# Patient Record
Sex: Male | Born: 1960 | Race: Black or African American | Hispanic: No | Marital: Married | Smoking: Former smoker
Health system: Southern US, Community
[De-identification: ages and names within clinical notes are randomized; demographics above are authoritative.]

## PROBLEM LIST (undated history)

## (undated) DIAGNOSIS — I214 Non-ST elevation (NSTEMI) myocardial infarction: Secondary | ICD-10-CM

## (undated) DIAGNOSIS — D696 Thrombocytopenia, unspecified: Secondary | ICD-10-CM

## (undated) DIAGNOSIS — J3489 Other specified disorders of nose and nasal sinuses: Secondary | ICD-10-CM

## (undated) DIAGNOSIS — I10 Essential (primary) hypertension: Secondary | ICD-10-CM

## (undated) DIAGNOSIS — G500 Trigeminal neuralgia: Secondary | ICD-10-CM

## (undated) DIAGNOSIS — E785 Hyperlipidemia, unspecified: Secondary | ICD-10-CM

## (undated) DIAGNOSIS — J45909 Unspecified asthma, uncomplicated: Secondary | ICD-10-CM

## (undated) DIAGNOSIS — G459 Transient cerebral ischemic attack, unspecified: Secondary | ICD-10-CM

## (undated) DIAGNOSIS — G8929 Other chronic pain: Secondary | ICD-10-CM

## (undated) DIAGNOSIS — N400 Enlarged prostate without lower urinary tract symptoms: Secondary | ICD-10-CM

## (undated) DIAGNOSIS — N182 Chronic kidney disease, stage 2 (mild): Secondary | ICD-10-CM

## (undated) DIAGNOSIS — T4145XA Adverse effect of unspecified anesthetic, initial encounter: Secondary | ICD-10-CM

## (undated) DIAGNOSIS — R079 Chest pain, unspecified: Secondary | ICD-10-CM

## (undated) DIAGNOSIS — G44009 Cluster headache syndrome, unspecified, not intractable: Secondary | ICD-10-CM

## (undated) DIAGNOSIS — I5042 Chronic combined systolic (congestive) and diastolic (congestive) heart failure: Secondary | ICD-10-CM

## (undated) DIAGNOSIS — D126 Benign neoplasm of colon, unspecified: Secondary | ICD-10-CM

## (undated) DIAGNOSIS — F1910 Other psychoactive substance abuse, uncomplicated: Secondary | ICD-10-CM

## (undated) DIAGNOSIS — K449 Diaphragmatic hernia without obstruction or gangrene: Secondary | ICD-10-CM

## (undated) DIAGNOSIS — K5790 Diverticulosis of intestine, part unspecified, without perforation or abscess without bleeding: Secondary | ICD-10-CM

## (undated) DIAGNOSIS — I2510 Atherosclerotic heart disease of native coronary artery without angina pectoris: Secondary | ICD-10-CM

## (undated) DIAGNOSIS — E039 Hypothyroidism, unspecified: Secondary | ICD-10-CM

## (undated) DIAGNOSIS — E042 Nontoxic multinodular goiter: Secondary | ICD-10-CM

## (undated) DIAGNOSIS — Z87442 Personal history of urinary calculi: Secondary | ICD-10-CM

## (undated) DIAGNOSIS — K219 Gastro-esophageal reflux disease without esophagitis: Secondary | ICD-10-CM

## (undated) DIAGNOSIS — R911 Solitary pulmonary nodule: Secondary | ICD-10-CM

## (undated) DIAGNOSIS — T8859XA Other complications of anesthesia, initial encounter: Secondary | ICD-10-CM

## (undated) HISTORY — DX: Nontoxic multinodular goiter: E04.2

## (undated) HISTORY — DX: Solitary pulmonary nodule: R91.1

## (undated) HISTORY — DX: Benign neoplasm of colon, unspecified: D12.6

## (undated) HISTORY — DX: Other chronic pain: G89.29

## (undated) HISTORY — DX: Trigeminal neuralgia: G50.0

## (undated) HISTORY — DX: Thrombocytopenia, unspecified: D69.6

## (undated) HISTORY — DX: Gastro-esophageal reflux disease without esophagitis: K21.9

## (undated) HISTORY — PX: CORONARY ANGIOPLASTY: SHX604

## (undated) HISTORY — PX: APPENDECTOMY: SHX54

## (undated) HISTORY — DX: Chest pain, unspecified: R07.9

## (undated) HISTORY — DX: Diverticulosis of intestine, part unspecified, without perforation or abscess without bleeding: K57.90

## (undated) HISTORY — DX: Chronic combined systolic (congestive) and diastolic (congestive) heart failure: I50.42

## (undated) HISTORY — DX: Cluster headache syndrome, unspecified, not intractable: G44.009

## (undated) HISTORY — DX: Hyperlipidemia, unspecified: E78.5

## (undated) HISTORY — DX: Chronic kidney disease, stage 2 (mild): N18.2

## (undated) HISTORY — DX: Other specified disorders of nose and nasal sinuses: J34.89

## (undated) HISTORY — DX: Diaphragmatic hernia without obstruction or gangrene: K44.9

## (undated) HISTORY — PX: COLECTOMY: SHX59

## (undated) HISTORY — PX: DESTRUCTION TRIGEMINAL NERVE VIA NEUROLYTIC AGENT: SUR416

## (undated) HISTORY — DX: Unspecified asthma, uncomplicated: J45.909

---

## 1998-04-05 ENCOUNTER — Ambulatory Visit (HOSPITAL_COMMUNITY): Admission: RE | Admit: 1998-04-05 | Discharge: 1998-04-05 | Payer: Self-pay | Admitting: Internal Medicine

## 1998-04-05 ENCOUNTER — Encounter: Payer: Self-pay | Admitting: Internal Medicine

## 1999-10-04 ENCOUNTER — Emergency Department (HOSPITAL_COMMUNITY): Admission: EM | Admit: 1999-10-04 | Discharge: 1999-10-05 | Payer: Self-pay | Admitting: Emergency Medicine

## 1999-10-05 ENCOUNTER — Encounter: Payer: Self-pay | Admitting: Emergency Medicine

## 1999-10-13 ENCOUNTER — Ambulatory Visit (HOSPITAL_COMMUNITY): Admission: RE | Admit: 1999-10-13 | Discharge: 1999-10-13 | Payer: Self-pay | Admitting: Internal Medicine

## 1999-10-13 ENCOUNTER — Encounter: Payer: Self-pay | Admitting: Internal Medicine

## 2000-03-09 ENCOUNTER — Emergency Department (HOSPITAL_COMMUNITY): Admission: EM | Admit: 2000-03-09 | Discharge: 2000-03-09 | Payer: Self-pay

## 2000-03-09 ENCOUNTER — Encounter: Payer: Self-pay | Admitting: Emergency Medicine

## 2000-04-06 ENCOUNTER — Ambulatory Visit (HOSPITAL_COMMUNITY): Admission: RE | Admit: 2000-04-06 | Discharge: 2000-04-06 | Payer: Self-pay | Admitting: *Deleted

## 2000-04-06 ENCOUNTER — Encounter: Payer: Self-pay | Admitting: *Deleted

## 2000-06-26 ENCOUNTER — Ambulatory Visit (HOSPITAL_BASED_OUTPATIENT_CLINIC_OR_DEPARTMENT_OTHER): Admission: RE | Admit: 2000-06-26 | Discharge: 2000-06-26 | Payer: Self-pay | Admitting: *Deleted

## 2000-06-26 HISTORY — PX: KNEE ARTHROSCOPY: SUR90

## 2000-12-03 ENCOUNTER — Ambulatory Visit (HOSPITAL_BASED_OUTPATIENT_CLINIC_OR_DEPARTMENT_OTHER): Admission: RE | Admit: 2000-12-03 | Discharge: 2000-12-03 | Payer: Self-pay | Admitting: *Deleted

## 2001-01-09 ENCOUNTER — Encounter: Payer: Self-pay | Admitting: Internal Medicine

## 2001-01-09 ENCOUNTER — Ambulatory Visit (HOSPITAL_COMMUNITY): Admission: RE | Admit: 2001-01-09 | Discharge: 2001-01-09 | Payer: Self-pay | Admitting: Internal Medicine

## 2001-07-13 ENCOUNTER — Emergency Department (HOSPITAL_COMMUNITY): Admission: EM | Admit: 2001-07-13 | Discharge: 2001-07-13 | Payer: Self-pay | Admitting: Emergency Medicine

## 2001-09-22 ENCOUNTER — Emergency Department (HOSPITAL_COMMUNITY): Admission: EM | Admit: 2001-09-22 | Discharge: 2001-09-22 | Payer: Self-pay | Admitting: Emergency Medicine

## 2002-01-02 ENCOUNTER — Ambulatory Visit (HOSPITAL_COMMUNITY): Admission: RE | Admit: 2002-01-02 | Discharge: 2002-01-02 | Payer: Self-pay | Admitting: *Deleted

## 2002-06-13 ENCOUNTER — Ambulatory Visit (HOSPITAL_COMMUNITY): Admission: RE | Admit: 2002-06-13 | Discharge: 2002-06-13 | Payer: Self-pay | Admitting: Internal Medicine

## 2002-06-13 ENCOUNTER — Encounter: Payer: Self-pay | Admitting: Internal Medicine

## 2003-02-02 ENCOUNTER — Emergency Department (HOSPITAL_COMMUNITY): Admission: EM | Admit: 2003-02-02 | Discharge: 2003-02-02 | Payer: Self-pay | Admitting: Emergency Medicine

## 2005-01-05 ENCOUNTER — Ambulatory Visit: Payer: Self-pay | Admitting: Internal Medicine

## 2005-01-18 ENCOUNTER — Ambulatory Visit: Payer: Self-pay | Admitting: Gastroenterology

## 2005-01-23 ENCOUNTER — Ambulatory Visit: Payer: Self-pay | Admitting: Gastroenterology

## 2005-03-09 ENCOUNTER — Ambulatory Visit: Payer: Self-pay | Admitting: Gastroenterology

## 2005-04-06 ENCOUNTER — Ambulatory Visit: Payer: Self-pay | Admitting: Internal Medicine

## 2005-06-17 ENCOUNTER — Emergency Department (HOSPITAL_COMMUNITY): Admission: EM | Admit: 2005-06-17 | Discharge: 2005-06-17 | Payer: Self-pay | Admitting: Emergency Medicine

## 2005-06-19 ENCOUNTER — Ambulatory Visit: Payer: Self-pay | Admitting: Internal Medicine

## 2005-06-22 ENCOUNTER — Ambulatory Visit: Payer: Self-pay | Admitting: Internal Medicine

## 2005-06-27 ENCOUNTER — Ambulatory Visit: Payer: Self-pay | Admitting: Internal Medicine

## 2005-12-09 ENCOUNTER — Ambulatory Visit: Payer: Self-pay | Admitting: Internal Medicine

## 2005-12-09 ENCOUNTER — Inpatient Hospital Stay (HOSPITAL_COMMUNITY): Admission: EM | Admit: 2005-12-09 | Discharge: 2005-12-11 | Payer: Self-pay | Admitting: Emergency Medicine

## 2005-12-09 ENCOUNTER — Ambulatory Visit: Payer: Self-pay | Admitting: Family Medicine

## 2005-12-14 ENCOUNTER — Ambulatory Visit: Payer: Self-pay | Admitting: Internal Medicine

## 2006-03-05 ENCOUNTER — Encounter: Admission: RE | Admit: 2006-03-05 | Discharge: 2006-03-05 | Payer: Self-pay | Admitting: Internal Medicine

## 2006-03-05 ENCOUNTER — Ambulatory Visit: Payer: Self-pay | Admitting: Internal Medicine

## 2006-03-14 ENCOUNTER — Ambulatory Visit: Payer: Self-pay | Admitting: Internal Medicine

## 2006-03-22 ENCOUNTER — Encounter: Admission: RE | Admit: 2006-03-22 | Discharge: 2006-03-22 | Payer: Self-pay | Admitting: Orthopedic Surgery

## 2006-04-02 ENCOUNTER — Ambulatory Visit: Payer: Self-pay | Admitting: Internal Medicine

## 2006-04-24 HISTORY — PX: SHOULDER HEMI-ARTHROPLASTY: SHX5049

## 2006-05-11 ENCOUNTER — Inpatient Hospital Stay (HOSPITAL_COMMUNITY): Admission: RE | Admit: 2006-05-11 | Discharge: 2006-05-14 | Payer: Self-pay | Admitting: Orthopedic Surgery

## 2006-12-27 ENCOUNTER — Encounter: Payer: Self-pay | Admitting: Internal Medicine

## 2006-12-27 DIAGNOSIS — E785 Hyperlipidemia, unspecified: Secondary | ICD-10-CM | POA: Insufficient documentation

## 2006-12-27 DIAGNOSIS — K219 Gastro-esophageal reflux disease without esophagitis: Secondary | ICD-10-CM | POA: Insufficient documentation

## 2006-12-27 DIAGNOSIS — G44009 Cluster headache syndrome, unspecified, not intractable: Secondary | ICD-10-CM | POA: Insufficient documentation

## 2006-12-27 DIAGNOSIS — I10 Essential (primary) hypertension: Secondary | ICD-10-CM | POA: Insufficient documentation

## 2007-01-04 ENCOUNTER — Ambulatory Visit: Payer: Self-pay | Admitting: Endocrinology

## 2007-01-10 ENCOUNTER — Ambulatory Visit: Payer: Self-pay | Admitting: Internal Medicine

## 2007-01-11 ENCOUNTER — Ambulatory Visit: Payer: Self-pay | Admitting: Internal Medicine

## 2007-01-11 LAB — CONVERTED CEMR LAB
Anti Nuclear Antibody(ANA): NEGATIVE
CRP, High Sensitivity: 6 — ABNORMAL HIGH (ref 0.00–5.00)
Total CK: 478 units/L (ref 7–195)

## 2007-01-16 ENCOUNTER — Telehealth: Payer: Self-pay | Admitting: Internal Medicine

## 2007-01-22 ENCOUNTER — Ambulatory Visit: Payer: Self-pay | Admitting: Internal Medicine

## 2007-01-22 LAB — CONVERTED CEMR LAB
ALT: 23 units/L (ref 0–53)
AST: 21 units/L (ref 0–37)
Albumin: 3.9 g/dL (ref 3.5–5.2)
Alkaline Phosphatase: 42 units/L (ref 39–117)
BUN: 24 mg/dL — ABNORMAL HIGH (ref 6–23)
Basophils Absolute: 0.3 10*3/uL — ABNORMAL HIGH (ref 0.0–0.1)
Basophils Relative: 2.2 % — ABNORMAL HIGH (ref 0.0–1.0)
CO2: 28 meq/L (ref 19–32)
Calcium: 9.4 mg/dL (ref 8.4–10.5)
Chloride: 103 meq/L (ref 96–112)
Creatinine, Ser: 1.4 mg/dL (ref 0.4–1.5)
Eosinophils Absolute: 0 10*3/uL (ref 0.0–0.6)
Eosinophils Relative: 0.2 % (ref 0.0–5.0)
GFR calc Af Amer: 71 mL/min
GFR calc non Af Amer: 59 mL/min
Glucose, Bld: 134 mg/dL — ABNORMAL HIGH (ref 70–99)
HCT: 45.1 % (ref 39.0–52.0)
Hemoglobin: 15.4 g/dL (ref 13.0–17.0)
LDH: 186 units/L (ref 94–250)
Lymphocytes Relative: 19.2 % (ref 12.0–46.0)
MCHC: 34.1 g/dL (ref 30.0–36.0)
MCV: 93 fL (ref 78.0–100.0)
Monocytes Absolute: 0.5 10*3/uL (ref 0.2–0.7)
Monocytes Relative: 3.6 % (ref 3.0–11.0)
Neutro Abs: 9.8 10*3/uL — ABNORMAL HIGH (ref 1.4–7.7)
Neutrophils Relative %: 74.8 % (ref 43.0–77.0)
Platelets: 218 10*3/uL (ref 150–400)
Potassium: 4.2 meq/L (ref 3.5–5.1)
RBC: 4.85 M/uL (ref 4.22–5.81)
RDW: 13.6 % (ref 11.5–14.6)
Sed Rate: 4 mm/hr (ref 0–20)
Sodium: 138 meq/L (ref 135–145)
Total Bilirubin: 0.7 mg/dL (ref 0.3–1.2)
Total Protein: 6.6 g/dL (ref 6.0–8.3)
Uric Acid, Serum: 5.3 mg/dL (ref 2.4–7.0)
WBC: 13.1 10*3/uL — ABNORMAL HIGH (ref 4.5–10.5)

## 2007-01-25 ENCOUNTER — Telehealth: Payer: Self-pay | Admitting: Internal Medicine

## 2007-01-29 ENCOUNTER — Ambulatory Visit: Payer: Self-pay | Admitting: Internal Medicine

## 2007-01-29 LAB — CONVERTED CEMR LAB: Sed Rate: 5 mm/hr (ref 0–20)

## 2007-02-02 ENCOUNTER — Telehealth: Payer: Self-pay | Admitting: Internal Medicine

## 2007-02-13 ENCOUNTER — Telehealth: Payer: Self-pay | Admitting: Internal Medicine

## 2007-02-19 ENCOUNTER — Encounter: Payer: Self-pay | Admitting: Internal Medicine

## 2007-02-28 ENCOUNTER — Encounter: Payer: Self-pay | Admitting: Internal Medicine

## 2007-03-01 ENCOUNTER — Telehealth: Payer: Self-pay | Admitting: Internal Medicine

## 2007-03-06 ENCOUNTER — Telehealth: Payer: Self-pay | Admitting: Internal Medicine

## 2007-06-27 ENCOUNTER — Ambulatory Visit: Payer: Self-pay | Admitting: Internal Medicine

## 2007-07-24 ENCOUNTER — Ambulatory Visit: Payer: Self-pay | Admitting: Internal Medicine

## 2007-07-24 DIAGNOSIS — G500 Trigeminal neuralgia: Secondary | ICD-10-CM | POA: Insufficient documentation

## 2007-08-05 ENCOUNTER — Encounter: Payer: Self-pay | Admitting: Internal Medicine

## 2007-08-12 ENCOUNTER — Telehealth: Payer: Self-pay | Admitting: Internal Medicine

## 2007-08-20 ENCOUNTER — Ambulatory Visit: Payer: Self-pay | Admitting: Internal Medicine

## 2007-08-20 LAB — CONVERTED CEMR LAB
ALT: 28 units/L (ref 0–53)
AST: 34 units/L (ref 0–37)
Albumin: 4 g/dL (ref 3.5–5.2)
Alkaline Phosphatase: 37 units/L — ABNORMAL LOW (ref 39–117)
BUN: 15 mg/dL (ref 6–23)
Basophils Absolute: 0 10*3/uL (ref 0.0–0.1)
Basophils Relative: 0.6 % (ref 0.0–1.0)
Bilirubin Urine: NEGATIVE
Bilirubin, Direct: 0.1 mg/dL (ref 0.0–0.3)
CO2: 30 meq/L (ref 19–32)
Calcium: 9.3 mg/dL (ref 8.4–10.5)
Chloride: 108 meq/L (ref 96–112)
Cholesterol: 322 mg/dL (ref 0–200)
Creatinine, Ser: 1.2 mg/dL (ref 0.4–1.5)
Direct LDL: 240.7 mg/dL
Eosinophils Absolute: 0.2 10*3/uL (ref 0.0–0.7)
Eosinophils Relative: 2.7 % (ref 0.0–5.0)
GFR calc Af Amer: 85 mL/min
GFR calc non Af Amer: 70 mL/min
Glucose, Bld: 117 mg/dL — ABNORMAL HIGH (ref 70–99)
HCT: 48.2 % (ref 39.0–52.0)
HDL: 61.4 mg/dL (ref >39.0)
Hemoglobin, Urine: NEGATIVE
Hemoglobin: 15.6 g/dL (ref 13.0–17.0)
Ketones, ur: NEGATIVE mg/dL
Leukocytes, UA: NEGATIVE
Lymphocytes Relative: 44.8 % (ref 12.0–46.0)
MCHC: 32.4 g/dL (ref 30.0–36.0)
MCV: 94.9 fL (ref 78.0–100.0)
Monocytes Absolute: 0.7 10*3/uL (ref 0.1–1.0)
Monocytes Relative: 10.1 % (ref 3.0–12.0)
Neutro Abs: 2.9 10*3/uL (ref 1.4–7.7)
Neutrophils Relative %: 41.8 % — ABNORMAL LOW (ref 43.0–77.0)
Nitrite: NEGATIVE
PSA: 1.02 ng/mL (ref 0.10–4.00)
Platelets: 190 10*3/uL (ref 150–400)
Potassium: 4.4 meq/L (ref 3.5–5.1)
RBC: 5.08 M/uL (ref 4.22–5.81)
RDW: 12.4 % (ref 11.5–14.6)
Sodium: 142 meq/L (ref 135–145)
Specific Gravity, Urine: 1.015 (ref 1.000–1.03)
TSH: 0.81 microintl units/mL (ref 0.35–5.50)
Total Bilirubin: 0.9 mg/dL (ref 0.3–1.2)
Total CHOL/HDL Ratio: 5.2
Total Protein, Urine: NEGATIVE mg/dL
Total Protein: 7 g/dL (ref 6.0–8.3)
Triglycerides: 81 mg/dL (ref 0–149)
Urine Glucose: NEGATIVE mg/dL
Urobilinogen, UA: 1 (ref 0.0–1.0)
VLDL: 16 mg/dL (ref 0–40)
WBC: 7 10*3/uL (ref 4.5–10.5)
pH: 7.5 (ref 5.0–8.0)

## 2007-08-22 ENCOUNTER — Encounter: Payer: Self-pay | Admitting: Internal Medicine

## 2007-10-08 ENCOUNTER — Ambulatory Visit: Payer: Self-pay | Admitting: Internal Medicine

## 2007-10-08 LAB — CONVERTED CEMR LAB
ALT: 27 units/L (ref 0–53)
AST: 22 units/L (ref 0–37)
Albumin: 3.7 g/dL (ref 3.5–5.2)
Alkaline Phosphatase: 31 units/L — ABNORMAL LOW (ref 39–117)
Bilirubin, Direct: 0.1 mg/dL (ref 0.0–0.3)
Cholesterol: 228 mg/dL (ref 0–200)
Direct LDL: 129.1 mg/dL
HDL: 73.9 mg/dL (ref >39.0)
Total Bilirubin: 0.7 mg/dL (ref 0.3–1.2)
Total CHOL/HDL Ratio: 3.1
Total Protein: 6.8 g/dL (ref 6.0–8.3)
Triglycerides: 86 mg/dL (ref 0–149)
VLDL: 17 mg/dL (ref 0–40)

## 2007-10-11 ENCOUNTER — Encounter: Payer: Self-pay | Admitting: Internal Medicine

## 2007-10-29 ENCOUNTER — Telehealth (INDEPENDENT_AMBULATORY_CARE_PROVIDER_SITE_OTHER): Payer: Self-pay | Admitting: *Deleted

## 2008-01-25 ENCOUNTER — Encounter: Admission: RE | Admit: 2008-01-25 | Discharge: 2008-01-25 | Payer: Self-pay | Admitting: Orthopedic Surgery

## 2008-04-09 ENCOUNTER — Ambulatory Visit: Payer: Self-pay | Admitting: Internal Medicine

## 2008-12-07 ENCOUNTER — Observation Stay (HOSPITAL_COMMUNITY): Admission: AD | Admit: 2008-12-07 | Discharge: 2008-12-08 | Payer: Self-pay | Admitting: Internal Medicine

## 2008-12-07 ENCOUNTER — Ambulatory Visit: Payer: Self-pay | Admitting: Internal Medicine

## 2008-12-07 ENCOUNTER — Ambulatory Visit: Payer: Self-pay | Admitting: Cardiovascular Disease

## 2008-12-08 HISTORY — PX: CARDIAC CATHETERIZATION: SHX172

## 2008-12-09 ENCOUNTER — Telehealth: Payer: Self-pay | Admitting: Internal Medicine

## 2008-12-10 ENCOUNTER — Telehealth: Payer: Self-pay | Admitting: Internal Medicine

## 2008-12-10 ENCOUNTER — Encounter: Payer: Self-pay | Admitting: Internal Medicine

## 2008-12-15 ENCOUNTER — Ambulatory Visit: Payer: Self-pay | Admitting: Internal Medicine

## 2008-12-15 DIAGNOSIS — R0989 Other specified symptoms and signs involving the circulatory and respiratory systems: Secondary | ICD-10-CM | POA: Insufficient documentation

## 2008-12-15 DIAGNOSIS — R071 Chest pain on breathing: Secondary | ICD-10-CM | POA: Insufficient documentation

## 2008-12-15 DIAGNOSIS — R0609 Other forms of dyspnea: Secondary | ICD-10-CM | POA: Insufficient documentation

## 2008-12-15 LAB — CONVERTED CEMR LAB
Angiotensin 1 Converting Enzyme: 46 units/L (ref 9–67)
Anti Nuclear Antibody(ANA): NEGATIVE
Rhuematoid fact SerPl-aCnc: 20 intl units/mL (ref 0.0–20.0)
Sed Rate: 10 mm/hr (ref 0–22)

## 2008-12-17 ENCOUNTER — Telehealth: Payer: Self-pay | Admitting: Internal Medicine

## 2008-12-21 ENCOUNTER — Encounter: Payer: Self-pay | Admitting: Internal Medicine

## 2008-12-25 ENCOUNTER — Ambulatory Visit: Payer: Self-pay | Admitting: Pulmonary Disease

## 2008-12-25 ENCOUNTER — Ambulatory Visit: Payer: Self-pay | Admitting: Internal Medicine

## 2008-12-30 ENCOUNTER — Encounter: Payer: Self-pay | Admitting: Internal Medicine

## 2008-12-31 ENCOUNTER — Telehealth (INDEPENDENT_AMBULATORY_CARE_PROVIDER_SITE_OTHER): Payer: Self-pay | Admitting: *Deleted

## 2008-12-31 DIAGNOSIS — E042 Nontoxic multinodular goiter: Secondary | ICD-10-CM | POA: Insufficient documentation

## 2009-01-05 ENCOUNTER — Ambulatory Visit: Payer: Self-pay | Admitting: Internal Medicine

## 2009-01-06 ENCOUNTER — Ambulatory Visit: Payer: Self-pay | Admitting: Pulmonary Disease

## 2009-01-06 DIAGNOSIS — R911 Solitary pulmonary nodule: Secondary | ICD-10-CM | POA: Insufficient documentation

## 2009-01-06 HISTORY — DX: Solitary pulmonary nodule: R91.1

## 2009-01-08 ENCOUNTER — Encounter: Admission: RE | Admit: 2009-01-08 | Discharge: 2009-01-08 | Payer: Self-pay | Admitting: Internal Medicine

## 2009-01-12 ENCOUNTER — Encounter (INDEPENDENT_AMBULATORY_CARE_PROVIDER_SITE_OTHER): Payer: Self-pay | Admitting: *Deleted

## 2009-01-12 LAB — CONVERTED CEMR LAB
ALT: 21 units/L (ref 0–53)
AST: 24 units/L (ref 0–37)
Albumin: 4 g/dL (ref 3.5–5.2)
Alkaline Phosphatase: 41 units/L (ref 39–117)
BUN: 15 mg/dL (ref 6–23)
Basophils Absolute: 0 10*3/uL (ref 0.0–0.1)
Basophils Relative: 0.5 % (ref 0.0–3.0)
Bilirubin Urine: NEGATIVE
Bilirubin, Direct: 0.1 mg/dL (ref 0.0–0.3)
CO2: 28 meq/L (ref 19–32)
Calcium: 9.2 mg/dL (ref 8.4–10.5)
Chloride: 109 meq/L (ref 96–112)
Cholesterol: 262 mg/dL — ABNORMAL HIGH (ref 0–200)
Creatinine, Ser: 1.2 mg/dL (ref 0.4–1.5)
Direct LDL: 197.8 mg/dL
Eosinophils Absolute: 0.2 10*3/uL (ref 0.0–0.7)
Eosinophils Relative: 2.8 % (ref 0.0–5.0)
Free T4: 1 ng/dL (ref 0.6–1.6)
GFR calc non Af Amer: 83.91 mL/min (ref >60)
Glucose, Bld: 97 mg/dL (ref 70–99)
HCT: 46.5 % (ref 39.0–52.0)
HDL: 55.2 mg/dL (ref >39.00)
Hemoglobin, Urine: NEGATIVE
Hemoglobin: 15.8 g/dL (ref 13.0–17.0)
Ketones, ur: NEGATIVE mg/dL
Leukocytes, UA: NEGATIVE
Lymphocytes Relative: 40.7 % (ref 12.0–46.0)
Lymphs Abs: 2.9 10*3/uL (ref 0.7–4.0)
MCHC: 34 g/dL (ref 30.0–36.0)
MCV: 94.5 fL (ref 78.0–100.0)
Monocytes Absolute: 0.8 10*3/uL (ref 0.1–1.0)
Monocytes Relative: 11.3 % (ref 3.0–12.0)
Neutro Abs: 3.2 10*3/uL (ref 1.4–7.7)
Neutrophils Relative %: 44.7 % (ref 43.0–77.0)
Nitrite: NEGATIVE
Platelets: 144 10*3/uL — ABNORMAL LOW (ref 150.0–400.0)
Potassium: 4.5 meq/L (ref 3.5–5.1)
RBC: 4.92 M/uL (ref 4.22–5.81)
RDW: 13.3 % (ref 11.5–14.6)
Sodium: 143 meq/L (ref 135–145)
Specific Gravity, Urine: 1.025 (ref 1.000–1.030)
T3 Uptake Ratio: 38.1 % — ABNORMAL HIGH (ref 22.5–37.0)
TSH: 0.52 microintl units/mL (ref 0.35–5.50)
Total Bilirubin: 1.1 mg/dL (ref 0.3–1.2)
Total CHOL/HDL Ratio: 5
Total Protein, Urine: NEGATIVE mg/dL
Total Protein: 7.2 g/dL (ref 6.0–8.3)
Triglycerides: 58 mg/dL (ref 0.0–149.0)
Urine Glucose: NEGATIVE mg/dL
Urobilinogen, UA: 0.2 (ref 0.0–1.0)
VLDL: 11.6 mg/dL (ref 0.0–40.0)
WBC: 7.1 10*3/uL (ref 4.5–10.5)
pH: 6 (ref 5.0–8.0)

## 2009-01-18 ENCOUNTER — Ambulatory Visit: Payer: Self-pay | Admitting: Internal Medicine

## 2009-01-18 DIAGNOSIS — Z9189 Other specified personal risk factors, not elsewhere classified: Secondary | ICD-10-CM | POA: Insufficient documentation

## 2009-01-19 ENCOUNTER — Encounter: Payer: Self-pay | Admitting: Internal Medicine

## 2009-01-19 ENCOUNTER — Encounter (INDEPENDENT_AMBULATORY_CARE_PROVIDER_SITE_OTHER): Payer: Self-pay | Admitting: Interventional Radiology

## 2009-01-19 ENCOUNTER — Encounter: Admission: RE | Admit: 2009-01-19 | Discharge: 2009-01-19 | Payer: Self-pay | Admitting: Internal Medicine

## 2009-01-19 ENCOUNTER — Other Ambulatory Visit: Admission: RE | Admit: 2009-01-19 | Discharge: 2009-01-19 | Payer: Self-pay | Admitting: Interventional Radiology

## 2009-01-19 DIAGNOSIS — J45909 Unspecified asthma, uncomplicated: Secondary | ICD-10-CM | POA: Insufficient documentation

## 2009-01-19 DIAGNOSIS — K56609 Unspecified intestinal obstruction, unspecified as to partial versus complete obstruction: Secondary | ICD-10-CM | POA: Insufficient documentation

## 2009-01-25 ENCOUNTER — Telehealth: Payer: Self-pay | Admitting: Internal Medicine

## 2009-03-09 ENCOUNTER — Ambulatory Visit: Payer: Self-pay | Admitting: Internal Medicine

## 2009-03-09 LAB — CONVERTED CEMR LAB: TSH: 0.66 microintl units/mL (ref 0.35–5.50)

## 2009-03-10 ENCOUNTER — Telehealth (INDEPENDENT_AMBULATORY_CARE_PROVIDER_SITE_OTHER): Payer: Self-pay | Admitting: *Deleted

## 2009-05-12 ENCOUNTER — Telehealth: Payer: Self-pay | Admitting: Internal Medicine

## 2009-06-15 ENCOUNTER — Telehealth: Payer: Self-pay | Admitting: Internal Medicine

## 2009-07-20 ENCOUNTER — Ambulatory Visit: Payer: Self-pay | Admitting: Internal Medicine

## 2009-07-20 LAB — CONVERTED CEMR LAB
ALT: 22 units/L (ref 0–53)
AST: 25 units/L (ref 0–37)
Albumin: 4 g/dL (ref 3.5–5.2)
Alkaline Phosphatase: 40 units/L (ref 39–117)
BUN: 17 mg/dL (ref 6–23)
Bilirubin, Direct: 0.1 mg/dL (ref 0.0–0.3)
CO2: 30 meq/L (ref 19–32)
Calcium: 9.3 mg/dL (ref 8.4–10.5)
Chloride: 105 meq/L (ref 96–112)
Cholesterol: 266 mg/dL — ABNORMAL HIGH (ref 0–200)
Creatinine, Ser: 1.1 mg/dL (ref 0.4–1.5)
Direct LDL: 209.8 mg/dL
Free T4: 0.9 ng/dL (ref 0.6–1.6)
GFR calc non Af Amer: 92.56 mL/min (ref >60)
Glucose, Bld: 96 mg/dL (ref 70–99)
HDL: 53.5 mg/dL (ref >39.00)
Potassium: 4.3 meq/L (ref 3.5–5.1)
Sodium: 143 meq/L (ref 135–145)
T3 Uptake Ratio: 35.3 % (ref 22.5–37.0)
TSH: 0.62 microintl units/mL (ref 0.35–5.50)
Total Bilirubin: 0.9 mg/dL (ref 0.3–1.2)
Total CHOL/HDL Ratio: 5
Total Protein: 7.4 g/dL (ref 6.0–8.3)
Triglycerides: 76 mg/dL (ref 0.0–149.0)
VLDL: 15.2 mg/dL (ref 0.0–40.0)

## 2009-08-17 ENCOUNTER — Encounter: Admission: RE | Admit: 2009-08-17 | Discharge: 2009-08-17 | Payer: Self-pay | Admitting: Internal Medicine

## 2009-08-23 ENCOUNTER — Encounter: Payer: Self-pay | Admitting: Internal Medicine

## 2009-08-26 ENCOUNTER — Telehealth: Payer: Self-pay | Admitting: Internal Medicine

## 2009-09-07 ENCOUNTER — Encounter: Admission: RE | Admit: 2009-09-07 | Discharge: 2009-09-07 | Payer: Self-pay | Admitting: Internal Medicine

## 2009-09-07 ENCOUNTER — Other Ambulatory Visit: Admission: RE | Admit: 2009-09-07 | Discharge: 2009-09-07 | Payer: Self-pay | Admitting: Interventional Radiology

## 2009-09-07 ENCOUNTER — Encounter: Payer: Self-pay | Admitting: Internal Medicine

## 2009-10-11 ENCOUNTER — Ambulatory Visit: Payer: Self-pay | Admitting: Internal Medicine

## 2009-10-21 ENCOUNTER — Ambulatory Visit (HOSPITAL_COMMUNITY)
Admission: RE | Admit: 2009-10-21 | Discharge: 2009-10-21 | Payer: Self-pay | Source: Home / Self Care | Admitting: Orthopedic Surgery

## 2009-12-08 ENCOUNTER — Ambulatory Visit (HOSPITAL_COMMUNITY)
Admission: RE | Admit: 2009-12-08 | Discharge: 2009-12-08 | Payer: Self-pay | Source: Home / Self Care | Admitting: Orthopedic Surgery

## 2009-12-09 ENCOUNTER — Encounter (INDEPENDENT_AMBULATORY_CARE_PROVIDER_SITE_OTHER): Payer: Self-pay | Admitting: *Deleted

## 2009-12-13 ENCOUNTER — Ambulatory Visit: Payer: Self-pay | Admitting: Cardiology

## 2009-12-16 ENCOUNTER — Telehealth: Payer: Self-pay | Admitting: Internal Medicine

## 2010-01-06 ENCOUNTER — Encounter: Payer: Self-pay | Admitting: Internal Medicine

## 2010-01-07 ENCOUNTER — Ambulatory Visit: Payer: Self-pay | Admitting: Internal Medicine

## 2010-01-07 DIAGNOSIS — G459 Transient cerebral ischemic attack, unspecified: Secondary | ICD-10-CM | POA: Insufficient documentation

## 2010-01-09 ENCOUNTER — Encounter: Payer: Self-pay | Admitting: Internal Medicine

## 2010-01-11 ENCOUNTER — Encounter: Payer: Self-pay | Admitting: Internal Medicine

## 2010-01-12 ENCOUNTER — Ambulatory Visit: Payer: Self-pay

## 2010-01-12 ENCOUNTER — Encounter: Payer: Self-pay | Admitting: Internal Medicine

## 2010-01-18 ENCOUNTER — Encounter: Payer: Self-pay | Admitting: Internal Medicine

## 2010-02-10 ENCOUNTER — Telehealth: Payer: Self-pay | Admitting: Internal Medicine

## 2010-02-23 ENCOUNTER — Ambulatory Visit: Payer: Self-pay | Admitting: Internal Medicine

## 2010-02-23 LAB — CONVERTED CEMR LAB
BUN: 21 mg/dL (ref 6–23)
CO2: 29 meq/L (ref 19–32)
Calcium: 9.1 mg/dL (ref 8.4–10.5)
Chloride: 106 meq/L (ref 96–112)
Creatinine, Ser: 1.2 mg/dL (ref 0.4–1.5)
GFR calc non Af Amer: 85.13 mL/min (ref >60)
Glucose, Bld: 90 mg/dL (ref 70–99)
Potassium: 4 meq/L (ref 3.5–5.1)
Sodium: 142 meq/L (ref 135–145)

## 2010-02-28 ENCOUNTER — Encounter: Payer: Self-pay | Admitting: Internal Medicine

## 2010-04-01 ENCOUNTER — Telehealth: Payer: Self-pay | Admitting: Internal Medicine

## 2010-04-04 ENCOUNTER — Telehealth: Payer: Self-pay | Admitting: Internal Medicine

## 2010-04-21 ENCOUNTER — Encounter: Payer: Self-pay | Admitting: Internal Medicine

## 2010-05-24 NOTE — Consult Note (Signed)
Summary: [facility]  [facility]   Imported By: Maryln Gottron 08/13/2007 14:33:54  _____________________________________________________________________  External Attachment:    Type:   Image     Comment:   External Document

## 2010-05-24 NOTE — Assessment & Plan Note (Signed)
Summary: f/u SD   Vital Signs:  Patient profile:   50 year old male Height:      70 inches Weight:      233.50 pounds O2 Sat:      97 % on Room air Temp:     98.7 degrees F oral Pulse rate:   72 / minute BP sitting:   128 / 92  (left arm) Cuff size:   large  Vitals Entered By: Loree Fee (December 15, 2008 1:57 PM)  O2 Flow:  Room air CC: follow-up visit/ SOB, light headed Comments pt states hes not taking Tussionex Pennkinetic-cf   Primary Care Provider:  Jacques Navy MD  CC:  follow-up visit/ SOB and light headed.  History of Present Illness: Recent hospitalization for atypical chest pain. Came to cardiac cath revealing mild non-obstructive disease in a setting of elevated CK total in the 600 range but normal Troponin I. Since discharge he continues to have shorntess of breath with exertion accompanied by diaphoresis, fatigue and chest wall pain. He has no cough, fever or other systemic symptoms.  Current Medications (verified): 1)  Zyrtec-D Allergy & Congestion 5-120 Mg  Tb12 (Cetirizine-Pseudoephedrine) .... Two Times A Day 2)  Protonix 40 Mg  Pack (Pantoprazole Sodium) .... Every Am 3)  Tylenol Sinus .... As Needed 4)  Prevacid 30 Mg  Cpdr (Lansoprazole) .Marland Kitchen.. 1   Once Daily Prn 5)  Viagra 100 Mg Tabs (Sildenafil Citrate) .Marland Kitchen.. 1 Daily As Directed 6)  Simvastatin 40 Mg  Tabs (Simvastatin) .Marland Kitchen.. 1 By Mouth Qpm 7)  Tussionex Pennkinetic Er 8-10 Mg/20ml Lqcr (Chlorpheniramine-Hydrocodone) .... 5 Ml By Mouth Two Times A Day As Needed Cough  Allergies (verified): 1)  ! * Tequin  Review of Systems       The patient complains of chest pain and dyspnea on exertion.  The patient denies anorexia, fever, weight loss, weight gain, hoarseness, syncope, peripheral edema, prolonged cough, and abdominal pain.    Physical Exam  General:  Well-developed,well-nourished,in no acute distress; alert,appropriate and cooperative throughout examination Head:  normocephalic and  atraumatic.   Eyes:  pupils equal, pupils round, corneas and lenses clear, and no injection.   Neck:  supple and full ROM.   Chest Wall:  tender to palpation in the intercostal spaces more on the left than the right Lungs:  normal respiratory effort and normal breath sounds.   Heart:  normal rate and regular rhythm.   Msk:  normal ROM, no joint tenderness, no joint swelling, no joint warmth, and no redness over joints.   Pulses:  2+radial Neurologic:  alert & oriented X3, cranial nerves II-XII intact, strength normal in all extremities, and gait normal.   Skin:  turgor normal, color normal, and no rashes.   Psych:  Oriented X3, memory intact for recent and remote, and good eye contact.     Impression & Recommendations:  Problem # 1:  CHEST WALL PAIN, ANTERIOR (XTG-626.94) Patient with persistent chest wall pain associated with shortness of breath and diaphoresis. He has had a full cardiac eval with no obstructive disease on catherization. Concern for rheumatologic/auto immune inflammatory process.  Plan: lab - ANA, ESR, RF, ACE level  Addendum: Tests: (1) Sed Rate (ESR)   Sed Rate                  10 mm/hr                    0-22  Tests: (2) Rheumatoid  Factor (RA)   Rheumatiod Factor                                     0.0-20.0       RESULT: <20.0 Rheumatoid Factor of <20.0 equals Negative results. IU/ml  Tests: (1) Anti Nuclear Antibody (ANA) Reflex (23900)  Anti Nuclear Antibody (ANA)                             NEG                         NEGATIVE  Tests: (2) Angiotensin 1 Converting Enzyme ([address])  Angiotensin 1 Converting Enzyme                             <No Reported Value>  Plan - with all labs being normal (ACE pending) workking diagnosis is costochondritis. Will try burst and taper of steroids for local inflammation: Prednisone 30mg  once daily x3, 20mg  once daily x 3, 10mg  once daily x6.  Complete Medication List: 1)  Zyrtec-d Allergy & Congestion 5-120 Mg Tb12  (Cetirizine-pseudoephedrine) .... Two times a day 2)  Protonix 40 Mg Pack (Pantoprazole sodium) .... Every am 3)  Tylenol Sinus  .... As needed 4)  Prevacid 30 Mg Cpdr (Lansoprazole) .Marland Kitchen.. 1   once daily prn 5)  Viagra 100 Mg Tabs (Sildenafil citrate) .Marland Kitchen.. 1 daily as directed 6)  Simvastatin 40 Mg Tabs (Simvastatin) .Marland Kitchen.. 1 by mouth qpm 7)  Tussionex Pennkinetic Er 8-10 Mg/52ml Lqcr (Chlorpheniramine-hydrocodone) .... 5 ml by mouth two times a day as needed cough  Other Orders: T-Antinuclear Antib (ANA) [id number]) T-Angiotensin i-Converting Enzyme ([id number]) TLB-Sedimentation Rate (ESR) (85652-ESR) TLB-Rheumatoid Factor (RA) (13086-VH)  Patient Instructions: 1)  shortness of breath and chest wall pain  - reviewed hospital records: cath revealed mild non-obstructive coronary disease with a normal ejection fraction = heart function. CK, a muscle enzyme, was elevatedt o three times normal but the cardiac portion was normal and the troponin, another cardiac enzyme, was normal. The chest x-ray was normal. I am concerned about a musculo-skeletal rheumatologic disorder. Plan - lab work to evaluated the latter. If this is all normal we will then go to pulmonary functions. for chest wall pain take 1 or 2 aleve (generic) twice a day, For stomach protection take pepcid or zantac once a day.

## 2010-05-24 NOTE — Progress Notes (Signed)
Summary: CALL  Phone Note Call from Patient Call back at [phone]   Summary of Call: Pt returned MD's call. Please call at # above.  Initial call taken by: Lamar Sprinkles, CMA,  December 16, 2009 9:16 AM  Follow-up for Phone Call        called - CT chest normal Follow-up by: Jacques Navy MD,  December 16, 2009 10:46 AM

## 2010-05-24 NOTE — Progress Notes (Signed)
  Phone Note Refill Request Message from:  Fax from Pharmacy on May 12, 2009 1:13 PM  Refills Requested: Medication #1:  PROTONIX 40 MG  PACK every AM Initial call taken by: Ami Bullins CMA,  May 12, 2009 1:14 PM    Prescriptions: PROTONIX 40 MG  PACK (PANTOPRAZOLE SODIUM) every AM  #90 x 3   Entered by:   Ami Bullins CMA   Authorized by:   Jacques Navy MD   Signed by:   Bill Salinas CMA on 05/12/2009   Method used:   Electronically to        [facility] [address] [address] (retail)       [address]       [place], [place]  [address]       Ph: [phone]       Fax: [phone]   RxID:   [id number]

## 2010-05-24 NOTE — Assessment & Plan Note (Signed)
Summary: CPX /  [employer] / $50 /[place]WS   Vital Signs:  Patient profile:   50 year old male Height:      70 inches Weight:      243 pounds BMI:     34.99 O2 Sat:      95 % on Room air Temp:     98.1 degrees F oral Pulse rate:   75 / minute BP sitting:   120 / 68  (left arm) Cuff size:   large  Vitals Entered By: Bill Salinas CMA (January 18, 2009 2:56 PM)  O2 Flow:  Room air CC: PT here for physical and c/o head cold x 2 days, pt also has form that needs to be reviewed for work/pt will get TD Booster today/ ab   Primary Care Provider:  Jacques Navy MD  CC:  PT here for physical and c/o head cold x 2 days and pt also has form that needs to be reviewed for work/pt will get TD Booster today/ ab.  History of Present Illness: Mr [name] presents for follow-up. He has had an eventful medical history over the past several months.  He had presented with very worrisome chest pain and SOB. He came to cardiac catherization that was normal. Full lab evaluation to uncover cause of SOB and fatigue was unrevealing.   He came to pulmonary evaluation: PFTs revealed restrictive airway disease responsive to bronchodilators and he has been started on chronic inhalational treatment with good results. CTA chest was negative except to reveal a large thyroid goiter with extension of the left lobe of the thyroid into the mediastinum with tracheal displacement to the right. Thyroid U/S does confirm large goiter with nodules in the left lobe at 1.8 and 1.7 cm. TSH, FT4 were normal, T3U minimally elevated. He is for guided needle biopsy of these large nodules, which do have microcalcifications, Tuesday, Spetember 28th.  He reports that he is breathing better and has not had as much chest pain. He is still more fatigued than his baseline.   Current Medications (verified): 1)  Zyrtec-D Allergy & Congestion 5-120 Mg  Tb12 (Cetirizine-Pseudoephedrine) .... Two Times A Day 2)  Protonix 40 Mg  Pack (Pantoprazole  Sodium) .... Every Am 3)  Tylenol Sinus .... As Needed 4)  Prevacid 30 Mg  Cpdr (Lansoprazole) .Marland Kitchen.. 1   Once Daily Prn 5)  Simvastatin 40 Mg  Tabs (Simvastatin) .Marland Kitchen.. 1 By Mouth Qpm  Allergies (verified): 1)  ! 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 father - '25: HTN, CAD/CHF,  mother -deceased @ 42: DM, Lipids, HTN Neg- colon cancer, prostate cancer Pos - family h/o maternal side for CAD/MI   heart disease: mother (m.i.), maternal aunt cancer father (mesothelioma)   Social History: Reviewed history from 12/25/2008 and no changes required. 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  1 to 2 cigs per day.  started at age 32  Alcohol use-no Drug use-no Regular exercise-no  Review of Systems       The patient complains of chest pain and dyspnea on exertion.  The patient denies anorexia, fever, weight loss, decreased hearing, hoarseness, syncope, peripheral edema, prolonged cough, headaches, abdominal pain, hematochezia, incontinence, muscle weakness, suspicious skin lesions, difficulty walking, depression, and enlarged lymph nodes.    Physical Exam  General:  Well-developed,well-nourished,in no acute distress;  alert,appropriate and cooperative throughout examination Head:  normocephalic and atraumatic.   Eyes:  vision grossly intact, pupils equal, pupils round, corneas and lenses clear, and no injection.   Neck:  supple.  Easily palpable thyroid, large left lobe that does extend to level of clavicle Chest Wall:  no deformities.   Lungs:  normal respiratory effort, normal breath sounds, and no wheezes.   Heart:  normal rate, regular rhythm, and no murmur.   Abdomen:  soft, non-tender, and normal bowel sounds.   Neurologic:  alert & oriented X3, cranial nerves II-XII intact, and strength normal in all extremities.   Skin:  turgor normal and color normal.   Psych:  Oriented X3, memory intact for recent and remote, normally interactive, and good eye contact.     Impression & Recommendations:  Problem # 1:  ASTHMA (ICD-493.90) Newly diagnosed and doing better on inhalational steroid/bronchodilator.  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  Plan - follow up CT chest in 1 year.   Problem # 3:  NONTOXIC MULTINODULAR GOITER (ICD-241.1) Patient with large nodules and goiter. For FNA 9/28 - recommendations to follow. If benign will put patient on thyroid suppression.   Problem # 4:  HYPERTENSION (ICD-401.9)  BP today: 120/68 Prior BP: 122/78 (01/06/2009)  Labs Reviewed: K+: 4.5 (01/05/2009) Creat: : 1.2 (01/05/2009)   Chol: 262 (01/05/2009)   HDL: 55.20 (01/05/2009)   LDL: DEL (10/08/2007)   TG: 58.0 (01/05/2009)  Good control  Problem # 5:  HYPERLIPIDEMIA (ICD-272.4)  His updated medication list for this problem includes:    Simvastatin 40 Mg Tabs (Simvastatin) .Marland Kitchen... 1 by mouth qpm  Labs Reviewed: SGOT: 24 (01/05/2009)   SGPT: 21 (01/05/2009)   HDL:55.20 (01/05/2009), 73.9 (10/08/2007)  LDL:197.3 (10/08/2007), DEL (08/20/2007)  Chol:262 (01/05/2009), 228 (10/08/2007)  Trig:58.0 (01/05/2009), 86 (10/08/2007)  Patient has restarted his  statin medication. He will need follow-up lipid panel in about 1 month.  Complete Medication List: 1)  Zyrtec-d Allergy & Congestion 5-120 Mg Tb12 (Cetirizine-pseudoephedrine) .... Two times a day 2)  Protonix 40 Mg Pack (Pantoprazole sodium) .... Every am 3)  Tylenol Sinus  .... As needed 4)  Prevacid 30 Mg Cpdr (Lansoprazole) .Marland Kitchen.. 1   once daily prn 5)  Simvastatin 40 Mg Tabs (Simvastatin) .Marland Kitchen.. 1 by mouth qpm 6)  Aerochamber W/flowsignal Misc (Spacer/aero-holding chambers) .... Use with inhaler Prescriptions: AEROCHAMBER W/FLOWSIGNAL  MISC (SPACER/AERO-HOLDING CHAMBERS) use with inhaler  #1 x 1   Entered and Authorized by:   Jacques Navy MD   Signed by:   Jacques Navy MD on 01/19/2009   Method used:   Electronically to        [facility] [address] (retail)       [address]       [place], [place]  [address]       Ph: [phone]       Fax: [phone]   RxID:   [id number]

## 2010-05-24 NOTE — Letter (Signed)
   [facility] [address] [place], [place]  [address] Phone: [phone]      Aug 23, 2009   [facility] [address] [place], [place] [address]  RE:  LAB RESULTS  Dear  Mr. Ayub,  The following is an interpretation of your most recent lab tests.  Please take note of any instructions provided or changes to medications that have resulted from your lab work.     Thyroid ultrasound reveals new nodule and further enlargement of old nodules. We can either get a repeat in 6 months or set you up for a biopsy of the nodule in question in the report.   Sincerely Yours,    Jacques Navy MD  S SOFT TISSUE HEAD/NECK - [id number]   Clinical Data: Nontoxic multinodular goiter   THYROID ULTRASOUND   Technique:  Ultrasound examination of the thyroid gland and adjacent soft tissues was performed.   Comparison: Ultrasound of the thyroid of 01/08/2009   Findings: The thyroid gland remains enlarged and inhomogeneous. The right lobe measures 7.2 cm sagittally, with a depth of 2.2 cm and width of 2.9 cm.  (Prior measurements were 7.2 x 2.1 x 1.1 cm). The left lobe currently measures 6.1 x 3.3 x 3.0 cm, with the isthmus measuring 8.6 mm.  (Prior measurements were 5.6 x 2.3 x 2.7 cm, with the isthmus measuring 8.0 mm).   Multiple nodules again are noted bilaterally.  The largest nodule is in the lower pole of the left lobe measuring 1.8 x 2.0 x 2.0 cm compared to prior measurements of 1.8 x 1.4 x 1.7 cm.  A nodule in the upper pole measures 1.6 x 0.7 x 2.0 cm compared to 1.8 x 0.8 x 1.7 cm previously.   A nodule in the lower pole on the right measures 1.2 x 1.1 x 1.6 cm, previously measuring only 6 x 5 x 5 mm. There is a lobular nodule within the inferior isthmus which is not seen previously measuring 1.9 x 2.1 x 2.4 cm.  Either continued follow-up or biopsy of the dominant nodules would be recommended.  Multiple other nodules bilaterally are  present of approximately 9 mm in maximum diameter.   IMPRESSION: The thyroid gland remains diffusely enlarged and inhomogeneous with multiple nodules.  There is a new nodule in the inferior isthmus, and a nodule on the right appears to have increased in size. Either biopsy or close follow-up is recommended.   Read By:  Juline Patch,  M.D.

## 2010-05-24 NOTE — Consult Note (Signed)
Summary: Neurosurgery/[facility]  Neurosurgery/[facility]   Imported By: Esmeralda Links D'jimraou 08/29/2007 13:11:25  _____________________________________________________________________  External Attachment:    Type:   Image     Comment:   External Document

## 2010-05-24 NOTE — Miscellaneous (Signed)
Summary: Orders Update  Clinical Lists Changes  Orders: Added new Referral order of Pulmonary Referral (Pulmonary) - Signed 

## 2010-05-24 NOTE — Progress Notes (Signed)
Summary: [facility]  Phone Note Call from Patient   Summary of Call: Pt is req refill on Viagra 100mg  #6 Initial call taken by: Lamar Sprinkles,  August 12, 2007 4:19 PM  Follow-up for Phone Call        ok to refill x 5 Follow-up by: D. Thomos Lemons DO,  August 12, 2007 5:14 PM    New/Updated Medications: VIAGRA 100 MG TABS (SILDENAFIL CITRATE) 1 daily as directed   Prescriptions: VIAGRA 100 MG TABS (SILDENAFIL CITRATE) 1 daily as directed  #6 x 5   Entered by:   Lamar Sprinkles   Authorized by:   Jacques Navy MD   Signed by:   Lamar Sprinkles on 08/12/2007   Method used:   Electronically sent to ...       [facility] [address]       [address]       [place], [place]  [address]       Ph: [phone]       Fax: [phone]   RxID:   [id number]

## 2010-05-24 NOTE — Miscellaneous (Signed)
Summary: Orders Update pft charges  Clinical Lists Changes  Orders: Added new Service order of Carbon Monoxide diffusing w/capacity (94720) - Signed Added new Service order of Lung Volumes (94240) - Signed Added new Service order of Spirometry (Pre & Post) (94060) - Signed 

## 2010-05-24 NOTE — Assessment & Plan Note (Signed)
Summary: consult for doe   Copy to:  [facility] Lovetta Condie/Referring Aislinn Feliz:  Jacques Navy MD  CC:  Pulmonary Consult.  History of Present Illness: The pt comes in today for evaluation of dyspnea.  He was in his usual state of health until approx. 6wks ago, when he noted that he was getting more sob.  He thinks the symptom came on gradually, and that it has not been progressive in nature.  He can get sob at times sitting down, and also with walking and talking.  He gets sob walking stairs, but can take trash cans to the street without difficulty.  He had a recent admit to hospital with chest pain and his sob.  Chest xray was unremarkable, and cath showed nonobstructive coronary disease.  The pt denies any cough or mucus production.  He has had intermittant LE edema.  His chest discomfort is still present, and he describes as a pressure.  It is in the center of his chest, and under his left arm.  He denies any past h/o asthma.  He does smoke, but in very small quantities.  Current Medications (verified): 1)  Zyrtec-D Allergy & Congestion 5-120 Mg  Tb12 (Cetirizine-Pseudoephedrine) .... Two Times A Day 2)  Protonix 40 Mg  Pack (Pantoprazole Sodium) .... Every Am 3)  Tylenol Sinus .... As Needed 4)  Prevacid 30 Mg  Cpdr (Lansoprazole) .Marland Kitchen.. 1   Once Daily Prn 5)  Simvastatin 40 Mg  Tabs (Simvastatin) .Marland Kitchen.. 1 By Mouth Qpm 6)  Prednisone 10 Mg Tabs (Prednisone) .... 3 Tabs Once Daily X 3 Days Then 2 Tabs Once Daily X 3 Days Then 1 Tab Once Daily X 6 Days Then Stop  Allergies (verified): 1)  ! * Tequin  Past History:  Past Medical History: Reviewed history from 07/24/2007 and no changes required. GERD Hyperlipidemia Hypertension (headache related) chronic inflammatory muscle pain. 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  Past Surgical History: Reviewed history from 07/24/2007 and no changes  required. 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 father - '25: HTN, CAD/CHF,  mother -deceased @ 22: DM, Lipids, HTN Neg- colon cancer, prostate cancer Pos - family h/o maternal side for CAD/MI   heart disease: mother (m.i.), maternal aunt cancer father (mesothelioma)   Social History: Reviewed history from 04/09/2008 and no changes required. 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  1 to 2 cigs per day.  started at age 20 Alcohol use-no Drug use-no Regular exercise-no  Review of Systems       The patient complains of shortness of breath with activity, shortness of breath at rest, chest pain, acid heartburn, indigestion, and weight change.  The patient denies productive cough, non-productive cough, coughing up blood, irregular heartbeats, loss of appetite, abdominal pain, difficulty swallowing, sore throat, tooth/dental problems, headaches, nasal congestion/difficulty breathing through nose, sneezing, itching, ear ache, anxiety, depression, hand/feet swelling, joint stiffness or pain, rash, change in color of mucus, and fever.    Vital Signs:  Patient profile:   50 year old male Height:      70 inches Weight:      245.38 pounds BMI:     35.34 O2 Sat:      98 % on Room air Temp:  98.0 degrees F oral Pulse rate:   80 / minute BP sitting:   122 / 84  (left arm) Cuff size:   large  Vitals Entered By: Arman Filter LPN (December 25, 2008 10:44 AM)  O2 Flow:  Room air CC: Pulmonary Consult Comments Medications reviewed with patient Arman Filter LPN  December 25, 2008 10:44 AM    Physical Exam  General:  overweight male in nad Eyes:  PERRLA and EOMI.   Nose:  septal deviation to left with mild obstruction. Mouth:  moderate  elongation of soft palate and uvula Neck:  no jvd, tmg, LN Lungs:  totally clear to auscultation no wheezing or rhonchi Heart:  rrr,  no mrg. Abdomen:  soft and nontender, bs+ Extremities:  no edema, pulses intact distally Neurologic:  alert and oriented, moves all 4.   Impression & Recommendations:  Problem # 1:  DYSPNEA ON EXERTION (ICD-786.09) the etiology for the patient's dyspnea on exertion is unclear based on available data, history, and exam.  I think he needs full pfts to exclude possible occult obstructive disease, and would also like to do a ct chest in light of his h/o undiagnosed chest pain along with his sob.  This may turn out to be related to his obesity and deconditioning, but doesn't explain its fairly acute onset.  Other Orders: Pulmonary Referral (Pulmonary) Consultation Level IV (04540) Radiology Referral (Radiology)  Patient Instructions: 1)  will check breathing studies 2)  will check scan of chest to make sure no blood clots in lungs.

## 2010-05-24 NOTE — Assessment & Plan Note (Signed)
Summary: rov for dyspnea/review of pfts.   Copy to:  Illene Regulus Primary Provider/Referring Provider:  Jacques Navy MD  CC:  Pt is here for a f/u appt to discuss PFT results.  .  History of Present Illness: The pt comes in today for f/u of his doe.  He has had a recent ct chest which showed no PE, but did show a small pulm nodule and thyroid abnl ?due to goiter?.  He has had pfts today which were very difficult for him due to issues with the technique.  Most of the flow volume loops were poor, and his best one had a normal contour until the very end.  Strictly by the numbers, he has mild airflow obstruction, with + response to bronchodilators...however, his technique is suspect.  He has no restriction or dlco abnormality.  I have gone over the study with him in detail, and have answered all of his questions.  Current Medications (verified): 1)  Zyrtec-D Allergy & Congestion 5-120 Mg  Tb12 (Cetirizine-Pseudoephedrine) .... Two Times A Day 2)  Protonix 40 Mg  Pack (Pantoprazole Sodium) .... Every Am 3)  Tylenol Sinus .... As Needed 4)  Prevacid 30 Mg  Cpdr (Lansoprazole) .Marland Kitchen.. 1   Once Daily Prn 5)  Simvastatin 40 Mg  Tabs (Simvastatin) .Marland Kitchen.. 1 By Mouth Qpm  Allergies (verified): 1)  ! * Tequin  Vital Signs:  Patient profile:   50 year old male Height:      70 inches Weight:      240 pounds O2 Sat:      100 % on Room air Temp:     97.9 degrees F oral Pulse rate:   86 / minute BP sitting:   122 / 78  (left arm) Cuff size:   large  Vitals Entered By: Arman Filter LPN (January 06, 2009 2:40 PM)  O2 Flow:  Room air CC: Pt is here for a f/u appt to discuss PFT results.   Comments Medications reviewed with patient Arman Filter LPN  January 06, 2009 2:40 PM    Physical Exam  General:  47 male in nad   Impression & Recommendations:  Problem # 1:  DYSPNEA ON EXERTION (ICD-786.09) the pt's spirometry is suggestive of airflow obstruction, but his technique was suspect.   It is unclear whether this is a real finding or not.  At this point, because he is symptomatic, will start on treatment for asthma to see how he responds.  He will let us know after a few weeks.  If he sees no difference, I suspect this is not asthma, and there is no obvious pulmonary explanation for his symptoms.  Problem # 2:  PULMONARY NODULE (ICD-518.89) the pt has a very small nodule in the rul which will need f/u.  He is in a low risk group (unless thyroid is malignant), and therefore will need a repeat ct in one year.  This most likely is inflammatory in nature.  Other Orders: Est. Patient Level III ([address]) Est. Patient Level III (98119)  Patient Instructions: 1)  trial of dulera 100/5 2puffs am and pm...rinse mouth 2)  give me some feedback in 3 weeks to let me know if medication is helping.

## 2010-05-24 NOTE — Progress Notes (Signed)
  Phone Note Outgoing Call   Summary of Call: e-mail from patient responded to (to be scanned): CT revealed enlarged thyroid raising question of a goiter. Plan is to do labs: TSH, FT4, T3 uptake; U/S neck/thyroid and possibly thyroid uptake scan.  Please enter labs: TSH, FT4, T3U diagnosis will be multi-nodular goiter. [facility] notified about u/s.  THANK YOU Initial call taken by: Jacques Navy MD,  December 31, 2008 8:59 AM  Follow-up for Phone Call        Pt is aware per Dr Debby Bud, please add to IDX TSH FT4 T3 dx: 241.1 THANKS Follow-up by: Lamar Sprinkles, CMA,  December 31, 2008 12:25 PM  Additional Follow-up for Phone Call Additional follow up Details #1::        lab orders entered in IDX. 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

## 2010-05-24 NOTE — Letter (Signed)
Summary: Derrek Gu MD/[facility]  Derrek Gu MD/[facility]   Imported By: Lester Stoddard 01/17/2010 08:52:53  _____________________________________________________________________  External Attachment:    Type:   Image     Comment:   External Document

## 2010-05-24 NOTE — Progress Notes (Signed)
Summary: BIOPSY  Phone Note Call from Patient Call back at [phone] cell   Caller: Patient Call For: Dr Debby Bud Summary of Call: Pt rec'd letter from Dr Debby Bud and wants to go ahead and set up Thyroid Biopsy, please advise. Initial call taken by: Verdell Face,  Aug 26, 2009 9:06 AM  Follow-up for Phone Call        [facility] notified Follow-up by: Jacques Navy MD,  Aug 26, 2009 1:26 PM

## 2010-05-24 NOTE — Progress Notes (Signed)
  Phone Note Refill Request Message from:  Fax from Pharmacy on June 15, 2009 12:44 PM  Refills Requested: Medication #1:  PREVACID 30 MG  CPDR 1   once daily prn   Last Refilled: 06/11/2009 Initial call taken by: Ami Bullins CMA,  June 15, 2009 12:45 PM    Prescriptions: PREVACID 30 MG  CPDR (LANSOPRAZOLE) 1   once daily prn  #90 x 3   Entered by:   Ami Bullins CMA   Authorized by:   Jacques Navy MD   Signed by:   Bill Salinas CMA on 06/15/2009   Method used:   Electronically to        [facility] (retail)       [address]       [place], [place]  [address]       Ph: [phone]       Fax: [phone]   RxID:   [id number]

## 2010-05-24 NOTE — Letter (Signed)
   [facility] [address] [place], [place]  [address] Phone: [phone]      October 15, 2007   [facility] [address] [place], [place] [address]  RE:  LAB RESULTS  Dear  Mr. Boschert,  The following is an interpretation of your most recent lab tests.  Please take note of any instructions provided or changes to medications that have resulted from your lab work.  PSA:  normal - no follow-up needed PSA: 1.02  ELECTROLYTES:  Good - no changes needed  KIDNEY FUNCTION TESTS:  Stable - no changes needed  LIVER FUNCTION TESTS:  Good - no changes needed  LIPID PANEL:  Stable - no changes needed Triglyceride: 86   Cholesterol: 228   LDL: DEL   HDL: 73.9   Chol/HDL%:  3.1 CALC  THYROID STUDIES:  Thyroid studies normal TSH: 0.81     DIABETIC STUDIES:  Good - no changes needed Blood Glucose: 117    CBC:  Good - no changes needed   Labs look fine and all results are within normal limits.   I hope you are doing well.  Call or e-mail me if you have questions ([email or web address]).   Sincerely Yours,    Jacques Navy MD

## 2010-05-24 NOTE — Progress Notes (Signed)
  Phone Note Call from Patient   Summary of Call: patient with diffuse muscle pain. Lab revealed elevated CK at 400+, elevated CRP at 16. Patient with no response over the weekend to prednisone 10mg . Pt advised to start a prednisone taper: 40mg  x 3, 30mg  x 3, 20mg  daily. He is to call if no relief at initial dose. Rx for vicodin 7.5/750 also called. Initial call taken by: men

## 2010-05-24 NOTE — Progress Notes (Signed)
  Phone Note Refill Request Message from:  Fax from Pharmacy on February 10, 2010 4:33 PM  Refills Requested: Medication #1:  LEVOTHYROXINE SODIUM 25 MCG TABS 1 by mouth once daily Initial call taken by: Ami Bullins CMA,  February 10, 2010 4:33 PM    Prescriptions: LEVOTHYROXINE SODIUM 25 MCG TABS (LEVOTHYROXINE SODIUM) 1 by mouth once daily  #30 x 4   Entered by:   Ami Bullins CMA   Authorized by:   Jacques Navy MD   Signed by:   Bill Salinas CMA on 02/10/2010   Method used:   Electronically to        [facility] (retail)       [address]       [place], [place]  [address]       Ph: [phone]       Fax: [phone]   RxID:   [id number]

## 2010-05-24 NOTE — Miscellaneous (Signed)
Summary: Orders Update   Clinical Lists Changes  Orders: Added new Referral order of Radiology Referral (Radiology) - Signed 

## 2010-05-24 NOTE — Progress Notes (Signed)
  Phone Note Call from Patient Call back at Home Phone [phone] Call back at [phone]   Caller: Patient Call For: Dr Debby Bud Summary of Call: Pt called, released from [facility] 8/17. Pt states nurses told not to return to work today 8/18.Pt had heart cath 8/17. Pt wants to know when to return to work? Initial call taken by: Verdell Face,  December 09, 2008 11:43 AM  Follow-up for Phone Call        called the patient: ok to return to work tomorrow. Don't do any heavy lifitng for 24-48 hrs.  Follow-up by: Jacques Navy MD,  December 09, 2008 1:25 PM

## 2010-05-24 NOTE — Progress Notes (Signed)
  Phone Note Call from Patient   Caller: Patient Reason for Call: Talk to Doctor Action Taken: Phone Call Completed, Rx Called In, Saachi Zale Notified Summary of Call: patient being treated and worked-up for muscle inflammation. He is currently taking prednisone 60mg  daily, lyrica 100mg  three times a day and percocet 10/325 for break-through pain. On this regimen he is doing better. He is concerned about not having meds for the W/E  Plan: continue prednisone, but try to taper down as discussed. Lab Monday-ESR. Continue lyrica 100mg  three times a day, Rx called in. Vicodin 7.5.750 q 6 for breakthrough #30. Will keep appt with Dr. Kellie Simmering

## 2010-05-24 NOTE — Letter (Signed)
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  If you need additional information, please feel free to contact our office.         Sincerely,    Jacques Navy MD

## 2010-05-24 NOTE — Progress Notes (Signed)
  Phone Note Outgoing Call   Reason for Call: Discuss lab or test results Summary of Call: please call patient: TSH is 0.66 which indicates adequate thyroid suppression to control goiter.   Merci Initial call taken by: Jacques Navy MD,  March 10, 2009 8:19 AM  Follow-up for Phone Call        informed pt of results Follow-up by: Ami Bullins CMA,  March 10, 2009 4:45 PM

## 2010-05-24 NOTE — Letter (Signed)
Summary: Out of Work  [facility]  [address] [place], [place] [address]   Phone: [phone]  Fax: [phone]    December 10, 2008   Employee:  [name]    To Whom It May Concern:   For Medical reasons, please excuse the above named employee from work for the following dates:  Start:   12/11/2008  End:   12/15/2008  If you need additional information, please feel free to contact our office.         Sincerely,    Lamar Sprinkles CMA [facility]) for Dr Judie Petit. [facility]

## 2010-05-24 NOTE — Assessment & Plan Note (Signed)
Summary: PERSONAL ISSUES/DIDNT WANT TO SHARE/AWARE OF CX FEES/NML   Vital Signs:  Patient Profile:   50 Years Old Male Height:     70 inches Weight:      243 pounds Temp:     97.7 degrees F oral Pulse rate:   81 / minute BP sitting:   133 / 87  (left arm) Cuff size:   large  Vitals Entered By: M.WILKIE/SMA                 PCP:  Shayonna Ocampo  Chief Complaint:  Wants to talk about Rx with Dr. Arthur Holms.  History of Present Illness: Have not seen since the fall of "08. He was being followed by Dr. Kellie Simmering for myositits. He had negative MRI thoracic spine at [facility], but did ha ve renal cysts. He has since stopped steroids and manages his discomfort  idet, weight loss and is careful about lifting.  Needs yearly refills: doing well well with protonix and prevacid as needed.  Sexual function: libido is normal, normal erections, when away for the weekend - normal function.    Current Allergies: ! * TEQUIN        Impression & Recommendations:  Problem # 1:  HYPERTENSION (ICD-401.9)  BP today: 133/87 Prior BP: 143/88 (03/14/2006)  Labs Reviewed: Creat: 1.4 (01/22/2007)  BP is well controlled. no medication  Problem # 2:  HYPERLIPIDEMIA (ICD-272.4) Has been off medication.  Plan: lipid panel The following medications were removed from the medication list:    Lipitor 20 Mg Tabs (Atorvastatin calcium) ..... Once daily  Labs Reviewed: SGOT: 21 (01/22/2007)   SGPT: 23 (01/22/2007)   Problem # 3:  decreased tumescence see HPI. Plan: trial of viagra 50 mg.  Complete Medication List: 1)  Zyrtec-d Allergy & Congestion 5-120 Mg Tb12 (Cetirizine-pseudoephedrine) .... Two times a day 2)  Protonix 40 Mg Pack (Pantoprazole sodium) .... Every am 3)  Tylenol Sinus  .... As needed 4)  Prevacid 30 Mg Cpdr (Lansoprazole) .Marland Kitchen.. 1   once daily prn     Prescriptions: PREVACID 30 MG  CPDR (LANSOPRAZOLE) 1   once daily prn  #30 x 12   Entered and Authorized by:   Jacques Navy  MD   Signed by:   Jacques Navy MD on 06/27/2007   Method used:   Electronically sent to ...       [facility]       [address]       [place], [place]  [address]       Ph: [phone]       Fax: [phone]   RxID:   [id number] PROTONIX 40 MG  PACK (PANTOPRAZOLE SODIUM) every AM  #30 x 12   Entered and Authorized by:   Jacques Navy MD   Signed by:   Jacques Navy MD on 06/27/2007   Method used:   Electronically sent to ...       [facility]       [address]       [place], [place]  [address]       Ph: [phone]       Fax: [phone]   RxID:   [id number] ZYRTEC-D ALLERGY & CONGESTION 5-120 MG  TB12 (CETIRIZINE-PSEUDOEPHEDRINE) two times a day  #60 x 12   Entered and Authorized  by:   Jacques Navy MD   Signed by:   Jacques Navy MD on 06/27/2007   Method used:   Electronically sent to ...       [facility]       [address]       [place], [place]  [address]       Ph: [phone]       Fax: [phone]   RxID:   [id number]  ]

## 2010-05-24 NOTE — Letter (Signed)
   [facility] [address] [place], [place]  [address] Phone: [phone]      August 22, 2007   [facility] [address] [place], [place] [address]  RE:  LAB RESULTS  Dear  Mr. Ciaravino,  The following is an interpretation of your most recent lab tests.  Please take note of any instructions provided or changes to medications that have resulted from your lab work.  PSA:  normal - no follow-up needed PSA: 1.02  ELECTROLYTES:  Good - no changes needed  KIDNEY FUNCTION TESTS:  Good - no changes needed  LIVER FUNCTION TESTS:  Good - no changes needed  LIPID PANEL:  Please see the enclosed Rx for a change in medication Triglyceride: 81   Cholesterol: 322   LDL: DEL   HDL: 61.4   Chol/HDL%:  5.2 CALC  THYROID STUDIES:  Thyroid studies normal TSH: 0.81     DIABETIC STUDIES:  Good - no changes needed Blood Glucose: 117    CBC:  Good - no changes needed  LDL 240.7-off the charts   All labs look good except cholesterol! Prescription enclosed. You will need follow-up lab in 4 weeks.  Call or e-mail me if you have questions ([email or web address])    Medications Prescribed or Changed SIMVASTATIN 40 MG  TABS (SIMVASTATIN) 1 by mouth qPM   Sincerely Yours,    Jacques Navy MD

## 2010-05-24 NOTE — Progress Notes (Signed)
Summary: joint pain  Phone Note Call from Patient   Caller: Patient Summary of Call: Patient called c/o joint pain x 4 days. He read on the simvastatin bottle that if you notice joint pain then will need to call MD right away. Please advise.  Initial call taken by: Rock Nephew CMA,  October 29, 2007 2:17 PM  Follow-up for Phone Call        Sudden on-set less likely to be statin drug. OK to hold the medicine for 1-2 weeks to see if the pain goes away. If it does and the pain returns with a restart of medication we will need to look at alternative drugs. Follow-up by: Jacques Navy MD,  October 29, 2007 2:45 PM  Additional Follow-up for Phone Call Additional follow up Details #1::        Returned call/lmovm to call back  left mess to call office back .......................Marland KitchenLamar Sprinkles  October 31, 2007 10:16 AM  Additional Follow-up by: Rock Nephew CMA,  October 29, 2007 3:24 PM    Additional Follow-up for Phone Call Additional follow up Details #2::    left mess to call office back.....................Marland KitchenLamar Sprinkles  November 04, 2007 2:41 PM   Additional Follow-up for Phone Call Additional follow up Details #3:: Details for Additional Follow-up Action Taken: No return call after 3 attempts, closing phone note Additional Follow-up by: Rock Nephew CMA,  November 05, 2007 9:19 AM

## 2010-05-24 NOTE — Progress Notes (Signed)
Summary: ULTRASOUND RESULTS  Phone Note Outgoing Call Call back at Home Phone [phone]   Call placed by: Zackery Barefoot CMA,  March 06, 2007 1:56 PM Call placed to: Patient Summary of Call: CALLED PATIENT TO ADVISE ULTRASOUND SHOW NO PROBLEM. LEFT VOICE MESSAGE FOR PATIENT TO RETURN CALL Initial call taken by: Zackery Barefoot CMA,  March 06, 2007 1:57 PM  Follow-up for Phone Call        pt called back and advised same of ultrasound showing no problem. Follow-up by: Zackery Barefoot CMA,  March 07, 2007 8:57 AM

## 2010-05-24 NOTE — Progress Notes (Signed)
Summary: RESULTS  Phone Note Outgoing Call   Reason for Call: Discuss lab or test results Summary of Call: Please call the patienr: all labs normal: rhematoid factor, sed rate, ANA. ACE level pending. Working diagnosis is costochondritis. Plan - prednisone 30mg  once daily x 3, 20mg  once daily x 3, 10mg  once daily x 6 (#21). Please send in Rx.  Initial call taken by: Jacques Navy MD,  December 17, 2008 6:15 AM  Follow-up for Phone Call        Pt informed, rx sent in  Follow-up by: Lamar Sprinkles, CMA,  December 18, 2008 1:41 PM    New/Updated Medications: PREDNISONE 10 MG TABS (PREDNISONE) 3 tabs once daily x 3 days then 2 tabs once daily x 3 days then 1 tab once daily x 6 days then stop Prescriptions: PREDNISONE 10 MG TABS (PREDNISONE) 3 tabs once daily x 3 days then 2 tabs once daily x 3 days then 1 tab once daily x 6 days then stop  #21 x 0   Entered by:   Lamar Sprinkles, CMA   Authorized by:   Jacques Navy MD   Signed by:   Lamar Sprinkles, CMA on 12/18/2008   Method used:   Electronically to        [facility] (retail)       [address]       [place], [place]  [address]       Ph: [phone]       Fax: [phone]   RxID:   [id number]

## 2010-05-24 NOTE — Letter (Signed)
Summary: CT results/[facility]  CT results/[facility]   Imported By: Lester McCord 01/04/2009 08:13:12  _____________________________________________________________________  External Attachment:    Type:   Image     Comment:   External Document

## 2010-05-24 NOTE — Initial Assessments (Signed)
Summary: GETS WEAK OFF AND ON/ THINKS IT'S HIS BS/ DIDN'T WANT TO COME...   Vital Signs:  Patient profile:   50 year old male Height:      70 inches Weight:      238.25 pounds BMI:     34.31 O2 Sat:      97 % on Room air Temp:     97.8 degrees F oral Pulse rate:   76 / minute BP sitting:   150 / 84  (left arm) Cuff size:   large  Vitals Entered By: Beola Cord, CMA (December 07, 2008 3:11 PM)  O2 Flow:  Room air  CC: SOB, Chest pains, Weakness Comments D/C Simvastatin, Tussionex   Primary Care Provider:  Jacques Navy MD  CC:  SOB, Chest pains, and Weakness.  History of Present Illness: Patient presents for episodes being light-headed which can be relieved by something sweet; he has had chest-pain that will radiate through to his back described as a tightness accompanied by SOB and diaphoresis. He reports decreasee exercise tolerance: has to stop twice mowing is yard. He has reproducible pain with exercise.   Cardiac Risk: male, postive family history on maternal side; very high native cholesterol; HTN;  mild overweight.  He is being treated for reflux with protonix.   With two week history of worsening exertional chest pain and risks as above he is admitted to rule out for MI and for risk stratification.   Current Medications (verified): 1)  Zyrtec-D Allergy & Congestion 5-120 Mg  Tb12 (Cetirizine-Pseudoephedrine) .... Two Times A Day 2)  Protonix 40 Mg  Pack (Pantoprazole Sodium) .... Every Am 3)  Tylenol Sinus .... As Needed 4)  Prevacid 30 Mg  Cpdr (Lansoprazole) .Marland Kitchen.. 1   Once Daily Prn 5)  Viagra 100 Mg Tabs (Sildenafil Citrate) .Marland Kitchen.. 1 Daily As Directed 6)  Simvastatin 40 Mg  Tabs (Simvastatin) .Marland Kitchen.. 1 By Mouth Qpm 7)  Tussionex Pennkinetic Er 8-10 Mg/40ml Lqcr (Chlorpheniramine-Hydrocodone) .... 5 Ml By Mouth Two Times A Day As Needed Cough  Allergies (verified): 1)  ! * Tequin  Past History:  Past Medical History: Last updated:  07/24/2007 GERD Hyperlipidemia Hypertension (headache related) chronic inflammatory muscle pain. 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  Past Surgical History: Last updated: 07/24/2007 Appendectomy Right Knee arthroscopy right shoulder partial replacement for AVN - Jan '08 [facility] Lips)  Family History: Last updated: 12/07/2008 father - '25: HTN, CAD/CHF,  mother -deceased @ 29: DM, Lipids, HTN Neg- colon cancer, prostate cancer Pos - family h/o maternal side for CAD/MI  Social History: Last updated: 04/09/2008 HSG, Culinary school in [place] married '84 - 5 years divorced; remarried '96 1 son -'56 work: Patent examiner. Never Smoked Alcohol use-no Drug use-no Regular exercise-no  Risk Factors: Alcohol Use: <1 (07/24/2007) Caffeine Use: 3 (07/24/2007) Exercise: no (04/09/2008)  Risk Factors: Smoking Status: never (04/09/2008) Packs/Day: 1-5cigs (07/24/2007) Passive Smoke Exposure: no (04/09/2008)  Family History: father - '25: HTN, CAD/CHF,  mother -deceased @ 64: DM, Lipids, HTN Neg- colon cancer, prostate cancer Pos - family h/o maternal side for CAD/MI  Review of Systems  The patient denies anorexia, fever, weight loss, weight gain, decreased hearing, hoarseness, chest pain, dyspnea on exertion, peripheral edema, headaches, abdominal pain, incontinence, muscle weakness, difficulty walking, depression, and enlarged lymph nodes.    Physical Exam  General:  Well-developed,well-nourished,in no acute distress; alert,appropriate and cooperative throughout examination Head:  normocephalic and atraumatic.  Eyes:  vision grossly intact, pupils equal, pupils round, corneas and lenses clear, and no injection.   Ears:  R ear normal and L ear normal.   Nose:  no external deformity and no external erythema.   Mouth:  Oral mucosa and oropharynx without lesions or exudates.  Teeth in good  repair. Neck:  full ROM and no thyromegaly.   Lungs:  normal respiratory effort, normal breath sounds, no crackles, and no wheezes.   Heart:  normal rate, regular rhythm, and no HJR.   Abdomen:  soft, non-tender, normal bowel sounds, and no hepatomegaly.   Msk:  No deformity or scoliosis noted of thoracic or lumbar spine.   Pulses:  R and L carotid,radial,femoral,dorsalis pedis and posterior tibial pulses are full and equal bilaterally Neurologic:  No cranial nerve deficits noted. Station and gait are normal. Plantar reflexes are down-going bilaterally. DTRs are symmetrical throughout. Sensory, motor and coordinative functions appear intact. Skin:  Intact without suspicious lesions or rashes Cervical Nodes:  no anterior cervical adenopathy and no posterior cervical adenopathy.   Axillary Nodes:  no R axillary adenopathy and no L axillary adenopathy.   Psych:  Oriented X3, memory intact for recent and remote, normally interactive, and good eye contact.     Impression & Recommendations:  Problem # 1:  UNSTABLE ANGINA (ICD-411.1)  Patient with moderate risk factors who is giving a history of exertional chest pain c/w unstable angina.  Plan: tele admit         Lab - CE x 3, Cmet, CBCD, A1C         12 lead EKG         Routine orders  Orders: No Charge Patient Arrived (NCPA0) (NCPA0)  Problem # 2:  Hx of TRIGEMINAL NEURALGIA (ICD-350.1) Stable  Problem # 3:  HYPERTENSION (ICD-401.9)  BP today: 150/84 Prior BP: 122/88 (04/09/2008)  presently on no medication.  Plan: Lopressor 25mg  two times a day.   Problem # 4:  HYPERLIPIDEMIA (ICD-272.4) Last LDL 139 on medication down from 179.  Plan - continue meds.   His updated medication list for this problem includes:    Simvastatin 40 Mg Tabs (Simvastatin) .Marland Kitchen... 1 by mouth qpm  Complete Medication List: 1)  Zyrtec-d Allergy & Congestion 5-120 Mg Tb12 (Cetirizine-pseudoephedrine) .... Two times a day 2)  Protonix 40 Mg Pack  (Pantoprazole sodium) .... Every am 3)  Tylenol Sinus  .... As needed 4)  Prevacid 30 Mg Cpdr (Lansoprazole) .Marland Kitchen.. 1   once daily prn 5)  Viagra 100 Mg Tabs (Sildenafil citrate) .Marland Kitchen.. 1 daily as directed 6)  Simvastatin 40 Mg Tabs (Simvastatin) .Marland Kitchen.. 1 by mouth qpm 7)  Tussionex Pennkinetic Er 8-10 Mg/42ml Lqcr (Chlorpheniramine-hydrocodone) .... 5 ml by mouth two times a day as needed cough

## 2010-05-24 NOTE — Progress Notes (Signed)
  Phone Note Call from Patient   Caller: [phone] Summary of Call: Pt was d/c'd from hospital tuesday. 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 Pt c/o feeing weak, sob, dizzyness, sweats & chest discomfort. These are same symptoms that he had when put in hospital monday. Dr Debby Bud advised of symptoms. Dr said cardiac causes were r/o at hospital. Pt should rest and stay out of work, office visit next week. left mess to call office back  Initial call taken by: Lamar Sprinkles,  December 10, 2008 3:02 PM  Follow-up for Phone Call        Pt informed, scheduled for next tuesday, work note ready Follow-up by: Lamar Sprinkles,  December 10, 2008 4:48 PM

## 2010-05-24 NOTE — Assessment & Plan Note (Signed)
Summary: fell downsteps last pm,shoulder,neck pain/cd   Vital Signs:  Patient profile:   50 year old male Height:      70 inches Weight:      238 pounds BMI:     34.27 O2 Sat:      97 % on Room air Temp:     97.8 degrees F oral Pulse rate:   81 / minute BP sitting:   128 / 88  (left arm) Cuff size:   large  Vitals Entered By: Bill Salinas CMA (October 11, 2009 4:51 PM)  O2 Flow:  Room air CC: pt feel down step and injured his shoulder and neck on his right side/ ab   Primary Care Provider:  Jacques Navy MD  CC:  pt feel down step and injured his shoulder and neck on his right side/ ab.  History of Present Illness: Patient presents acutely after falling down stairs at 0200 this AM. Since the fall he has had trouble moving his right shoulder and has had a headache. He denies double vision, projectile vomiting, cognitive impairment.  Current Medications (verified): 1)  Zyrtec-D Allergy & Congestion 5-120 Mg  Tb12 (Cetirizine-Pseudoephedrine) .... Two Times A Day 2)  Protonix 40 Mg  Pack (Pantoprazole Sodium) .... Every Am 3)  Tylenol Sinus .... As Needed 4)  Prevacid 30 Mg  Cpdr (Lansoprazole) .Marland Kitchen.. 1   Once Daily Prn 5)  Vytorin 10-80 Mg Tabs (Ezetimibe-Simvastatin) .Marland Kitchen.. 1 By Mouth Once Daily For Cholesterol 6)  Aerochamber W/flowsignal  Misc (Spacer/aero-Holding Nenzel) .... Use With Inhaler 7)  Levothyroxine Sodium 25 Mcg Tabs (Levothyroxine Sodium) .Marland Kitchen.. 1 By Mouth Once Daily  Allergies (verified): 1)  ! * Tequin  Past History:  Past Medical History: Last updated: 01/18/2009 Hx of UNSPECIFIED INTESTINAL OBSTRUCTION (ICD-560.9) ASTHMA (ICD-493.90) CHEST PAIN, ATYPICAL, HX OF (ICD-V15.89) PULMONARY NODULE (ICD-518.89) NONTOXIC MULTINODULAR GOITER (ICD-241.1) Hx of CHEST WALL PAIN, ANTERIOR (ICD-786.52) DYSPNEA ON EXERTION (ICD-786.09) Hx of TRIGEMINAL NEURALGIA (ICD-350.1) HEADACHE, CLUSTER (ICD-346.20) HYPERTENSION (ICD-401.9) HYPERLIPIDEMIA (ICD-272.4) GERD  (ICD-530.81)  Past Surgical History: Last updated: 07/24/2007 Appendectomy Right Knee arthroscopy right shoulder partial replacement for AVN - Jan '08 [facility]) PSH reviewed for relevance, FH reviewed for relevance  Review of Systems       The patient complains of headaches.  The patient denies anorexia, fever, weight loss, weight gain, decreased hearing, chest pain, syncope, dyspnea on exertion, abdominal pain, severe indigestion/heartburn, muscle weakness, difficulty walking, depression, and angioedema.    Physical Exam  General:  heavyset AA male in no acute distress Head:  normocephalic and atraumatic.  No hematoma of occiput Eyes:  pupils equal, pupils round, pupils reactive to light, corneas and lenses clear, and no injection.   Ears:  R ear normal and L ear normal.   Neck:  supple and full ROM.   Lungs:  normal respiratory effort and normal breath sounds.   Heart:  normal rate and regular rhythm.   Msk:  decreased ROM right shoulder extension and rotation, cannot reach behind his back, decreased adduction and abduction, decreased extension. Normal grip strength, nl sensation in the hand. Nl ROM elbow.  Pulses:  2+ radial Neurologic:  alert & oriented X3, cranial nerves II-XII intact, and gait normal.     Impression & Recommendations:  Problem # 1:  SHOULDER PAIN, RIGHT (ICD-719.41)  Patient with acute shoulder pain after trauma from fall. He has limited ROM. He has had reconstruction of this shoulder by Dr. Madelon Lips.  Plan - refer to [facility] for evaluation -  disruption of right shoulder           for pain - ibuprofen 800mg  three times a day, APAP 100mg  three times a day  Orders: Orthopedic Surgeon Referral (Ortho Surgeon)  Problem # 2:  HEADACHE (ICD-784.0) Pt with headache after fall. His neurologic exam is normal. Concern for possible concussion.  Plan - close observation - patient advised to watch for any signs: double vision, projectile vomiting, cognitive change,  slurred speech, etc.  Complete Medication List: 1)  Zyrtec-d Allergy & Congestion 5-120 Mg Tb12 (Cetirizine-pseudoephedrine) .... Two times a day 2)  Protonix 40 Mg Pack (Pantoprazole sodium) .... Every am 3)  Tylenol Sinus  .... As needed 4)  Prevacid 30 Mg Cpdr (Lansoprazole) .Marland Kitchen.. 1   once daily prn 5)  Vytorin 10-80 Mg Tabs (Ezetimibe-simvastatin) .Marland Kitchen.. 1 by mouth once daily for cholesterol 6)  Aerochamber W/flowsignal Misc (Spacer/aero-holding chambers) .... Use with inhaler 7)  Levothyroxine Sodium 25 Mcg Tabs (Levothyroxine sodium) .Marland Kitchen.. 1 by mouth once daily

## 2010-05-24 NOTE — Progress Notes (Signed)
Summary: Req call   Phone Note Call from Patient Call back at Work Phone [phone]   Summary of Call: Patient is requesting a call from doctor. Pt was told that he had mases on his kidneys when he had his MRI and then was sent for an ultrasound. Initial call taken by: Lamar Sprinkles,  March 01, 2007 4:00 PM  Follow-up for Phone Call        I called the patient and will try to get information from [facility]. Follow-up by: Jacques Navy MD,  March 01, 2007 6:16 PM

## 2010-05-24 NOTE — Assessment & Plan Note (Signed)
Summary: 4 WK EXTEND OV-STC   Vital Signs:  Patient Profile:   50 Years Old Male Height:     70 inches Weight:      244 pounds Temp:     97.0 degrees F oral Pulse rate:   83 / minute BP sitting:   140 / 91  (left arm) Cuff size:   large  Vitals Entered By: Zackery Barefoot CMA (July 24, 2007 11:26 AM)                 PCP:  Melrose Kearse  Chief Complaint:  Check up.  History of Present Illness: Here for general physical exam. doing OK with no new medical complaints.  Recurrent symptoms of pain in the jaw and ear and with stimulation of the right occiput pain in the eye suggestive of early recurrent trigeminal neuralgia.  Having mscle spasm and stiffness in the right neck. Episode are fleeting.    Updated Prior Medication List: ZYRTEC-D ALLERGY & CONGESTION 5-120 MG  TB12 (CETIRIZINE-PSEUDOEPHEDRINE) two times a day PROTONIX 40 MG  PACK (PANTOPRAZOLE SODIUM) every AM * TYLENOL SINUS as needed PREVACID 30 MG  CPDR (LANSOPRAZOLE) 1   once daily prn  Current Allergies: ! Lorenda Cahill  Past Medical History:    Reviewed history from 12/27/2006 and no changes required:       GERD       Hyperlipidemia       Hypertension (headache related)       chronic inflammatory muscle pain.       cluster headache       trigeminal neuralgia - s/p gamma knife surgery '04       h/o SBO-did not require surgery.  Past Surgical History:    Reviewed history from 12/27/2006 and no changes required:       Appendectomy       Right Knee arthroscopy       right shoulder partial replacement for AVN - Jan '08 [facility])   Family History:    father - '25: HTN, CAD/CHF,     mother -deceased @ 65: DM, Lipids, HTN    Neg- colon cancer, prostate cancer  Social History:    HSG, Culinary school in [place]    married '84 - 5 years divorced; remarried '96    1 son -'41    work: Patent examiner.   Risk Factors:     Counseled to quit/cut down tobacco use:  no Caffeine use:  3 drinks  per day Alcohol use:  yes    Type:  rare    Drinks per day:  <1    Counseled to quit/cut down alcohol use:  no Exercise:  no Seatbelt use:  100 %   Review of Systems  The patient denies anorexia, fever, weight loss, weight gain, vision loss, decreased hearing, hoarseness, chest pain, syncope, dyspnea on exhertion, peripheral edema, prolonged cough, hemoptysis, abdominal pain, melena, hematochezia, severe indigestion/heartburn, hematuria, incontinence, genital sores, muscle weakness, suspicious skin lesions, transient blindness, difficulty walking, depression, unusual weight change, abnormal bleeding, enlarged lymph nodes, and testicular masses.     Physical Exam  General:     alert, well-developed, well-nourished, well-hydrated, and normal appearance.   Head:     Normocephalic and atraumatic without obvious abnormalities. No apparent alopecia or balding. Eyes:     pupils equal, pupils round, pupils reactive to light, corneas and lenses clear, and no injection.   Ears:     scarring of right TM, left looks normal  Mouth:     good dentition, no gingival abnormalities, no dental plaque, and pharynx pink and moist.   Neck:     No deformities, masses, or tenderness noted. Lungs:     normal respiratory effort, no intercostal retractions, no accessory muscle use, normal breath sounds, and no wheezes.   Heart:     normal rate, regular rhythm, no murmur, no gallop, no rub, and no HJR.   Abdomen:     soft, non-tender, normal bowel sounds, no distention, no masses, no guarding, no rigidity, no hepatomegaly, and no splenomegaly. Umbilical hernia easily reducible. Rectal:     no external abnormalities, no hemorrhoids, and normal sphincter tone.   Genitalia:     no hydrocele.   Prostate:     no gland enlargement, no nodules, no asymmetry, and no induration.   Msk:     normal ROM, no joint tenderness, no joint swelling, no joint warmth, no redness over joints, and no joint deformities.     Pulses:     R and L carotid,radial,femoral,dorsalis pedis and posterior tibial pulses are full and equal bilaterally Neurologic:     alert & oriented X3, cranial nerves II-XII intact, strength normal in all extremities, sensation intact to light touch, and DTRs symmetrical and normal.   Skin:     turgor normal, color normal, no rashes, no suspicious lesions, and no edema.   Cervical Nodes:     No lymphadenopathy noted Axillary Nodes:     No palpable lymphadenopathy Inguinal Nodes:     No significant adenopathy Psych:     Oriented X3, memory intact for recent and remote, normally interactive, and good eye contact.      Impression & Recommendations:  Problem # 1:  Hx of TRIGEMINAL NEURALGIA (ICD-350.1) Patient with symptoms that are suggestive of recurrent  trigeminal neuralgia.  Plan: refer to [facility] for possible repeat Gamma Knife surgery. He will obtain records from [facility].  Orders: Neurosurgeon Referral (Neurosurgeon)   Problem # 2:  HYPERTENSION (ICD-401.9)  BP today: 140/91 Prior BP: 133/87 (06/27/2007)  Labs Reviewed: Creat: 1.4 (01/22/2007)  borderline control.  Plan: home monitoring. If BPs greater than 140 /90 will need to adjust medications.   Problem # 3:  HYPERLIPIDEMIA (ICD-272.4) Not presently taking any medication. He will need follow-up lab and then recommendations to follow.  Labs Reviewed: SGOT: 21 (01/22/2007)   SGPT: 23 (01/22/2007)   Problem # 4:  GERD (ICD-530.81) Presently stable using Protonix in the AM and prevacid in the PM as needed.  His updated medication list for this problem includes:    Protonix 40 Mg Pack (Pantoprazole sodium) ..... Every am    Prevacid 30 Mg Cpdr (Lansoprazole) .Marland Kitchen... 1   once daily prn   Problem # 5:  Preventive Health Care (ICD-V70.0) gnerally stable. Current with exam. Returning for basic lab.  Complete Medication List: 1)  Zyrtec-d Allergy & Congestion 5-120 Mg Tb12  (Cetirizine-pseudoephedrine) .... Two times a day 2)  Protonix 40 Mg Pack (Pantoprazole sodium) .... Every am 3)  Tylenol Sinus  .... As needed 4)  Prevacid 30 Mg Cpdr (Lansoprazole) .Marland Kitchen.. 1   once daily prn     ]

## 2010-05-24 NOTE — Progress Notes (Signed)
  Phone Note Outgoing Call Call back at [phone]   Call placed by: Zackery Barefoot CMA,  February 02, 2007 12:28 PM Call placed to: Patient Summary of Call: On 02/02/2007 called pt in reference to note Dr. Debby Bud left, advised pt per Dr. Debby Bud can repeat ESR  (Dx 780.79) if he has recurrant pain as he tapers steroids. While on the phone pt states needs refill on Vicodin 7.5/750, advised pt Dr. Debby Bud is out of the office until 02/06/2007 and will order chart and let available PCP review same. Initial call taken by: Zackery Barefoot CMA,  February 02, 2007 12:33 PM

## 2010-05-24 NOTE — Letter (Signed)
   [facility] [address] [place], [place]  [address] Phone: [phone]      January 18, 2010   [facility] [address] [place], [place] [address]  RE:  LAB RESULTS  Dear  Mr. Skoog,  The following is an interpretation of your most recent lab tests.  Please take note of any instructions provided or changes to medications that have resulted from your lab work.   carotid doppler study is negative = no blockages of any kind.   Hope you are doing well. Good BP control??   Sincerely Yours,    Jacques Navy MD

## 2010-05-24 NOTE — Miscellaneous (Signed)
Summary: Orders Update  Clinical Lists Changes  Orders: Added new Test order of Carotid Duplex (Carotid Duplex) - Signed 

## 2010-05-24 NOTE — Progress Notes (Signed)
  Phone Note Outgoing Call   Reason for Call: Discuss lab or test results Summary of Call: reviewed path report from [facility]; negative for malignancy. He understands that at this point we are dealing with a large multi-nodular goiter. Plan is thyroid suppression with levothyroxine daily, f/u lab in 4 weeks.  Initial call taken by: Jacques Navy MD,  January 25, 2009 9:15 AM    New/Updated Medications: LEVOTHYROXINE SODIUM 25 MCG TABS (LEVOTHYROXINE SODIUM) 1 by mouth once daily Prescriptions: LEVOTHYROXINE SODIUM 25 MCG TABS (LEVOTHYROXINE SODIUM) 1 by mouth once daily  #30 x 12   Entered and Authorized by:   Jacques Navy MD   Signed by:   Jacques Navy MD on 01/25/2009   Method used:   Electronically to        [facility] [address] (retail)       [address]       [place], [place]  [address]       Ph: [phone]       Fax: [phone]   RxID:   [id number]

## 2010-05-24 NOTE — Assessment & Plan Note (Signed)
Summary: chest congestion/?bronchitis/no fever/cd   Vital Signs:  Patient Profile:   50 Years Old Male Height:     70 inches Weight:      247 pounds Temp:     97.0 degrees F oral Pulse rate:   68 / minute Pulse rhythm:   regular BP sitting:   122 / 88  (left arm) Cuff size:   large  Vitals Entered By: Rock Nephew CMA (April 09, 2008 2:44 PM)                 PCP:  Jacques Navy MD  Chief Complaint:  congestion and cough .  History of Present Illness: This is a new pt. to me who c/o's 5 day hx. of cough and runny nore productive or thick yellow/brown phlegm.  Acute Visit History:      The patient complains of cough, nasal discharge, and sinus problems.  He denies abdominal pain, chest pain, constipation, diarrhea, earache, eye symptoms, fever, genitourinary symptoms, headache, musculoskeletal symptoms, nausea, rash, sore throat, and vomiting.        The patient notes shortness of breath.  The cough interferes with his sleep.  The character of the cough is described as productive.  He has no history of COPD.  There is no history of wheezing, respiratory retractions, tachypnea, cyanosis, or interference with oral intake associated with his cough.        He complains of sinus pressure, nasal congestion, and purulent drainage.           Prior Medications Reviewed Using: Patient Recall  Prior Medication List:  ZYRTEC-D ALLERGY & CONGESTION 5-120 MG  TB12 (CETIRIZINE-PSEUDOEPHEDRINE) two times a day PROTONIX 40 MG  PACK (PANTOPRAZOLE SODIUM) every AM * TYLENOL SINUS as needed PREVACID 30 MG  CPDR (LANSOPRAZOLE) 1   once daily prn VIAGRA 100 MG TABS (SILDENAFIL CITRATE) 1 daily as directed SIMVASTATIN 40 MG  TABS (SIMVASTATIN) 1 by mouth qPM   Updated Prior Medication List: ZYRTEC-D ALLERGY & CONGESTION 5-120 MG  TB12 (CETIRIZINE-PSEUDOEPHEDRINE) two times a day PROTONIX 40 MG  PACK (PANTOPRAZOLE SODIUM) every AM * TYLENOL SINUS as needed PREVACID 30 MG  CPDR  (LANSOPRAZOLE) 1   once daily prn VIAGRA 100 MG TABS (SILDENAFIL CITRATE) 1 daily as directed SIMVASTATIN 40 MG  TABS (SIMVASTATIN) 1 by mouth qPM  Current Allergies (reviewed today): ! * TEQUIN  Past Medical History:    Reviewed history from 07/24/2007 and no changes required:       GERD       Hyperlipidemia       Hypertension (headache related)       chronic inflammatory muscle pain.       cluster headache       trigeminal neuralgia - s/p gamma knife surgery '04       h/o SBO-did not require surgery.  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       Never Smoked       Alcohol use-no       Drug use-no       Regular exercise-no   Risk Factors:  Tobacco use:  never Passive smoke exposure:  no Drug use:  no HIV high-risk behavior:  no Caffeine use:  3 drinks per day Alcohol use:  no Exercise:  no Seatbelt use:  100 %  Family History Risk Factors:    Family History of MI in females < 59 years old:  no    Family History of MI in males < 66 years old:  no   Review of Systems       The patient complains of weight gain, hoarseness, and prolonged cough.  The patient denies anorexia, fever, vision loss, chest pain, syncope, dyspnea on exertion, peripheral edema, headaches, hemoptysis, abdominal pain, melena, hematochezia, severe indigestion/heartburn, hematuria, difficulty walking,  depression, abnormal bleeding, and enlarged lymph nodes.     Physical Exam  General:     alert, well-developed, well-nourished, well-hydrated, and overweight-appearing.   Eyes:     No corneal or conjunctival inflammation noted. EOMI. Perrla. Funduscopic exam benign, without hemorrhages, exudates or papilledema. Vision grossly normal. Ears:     External ear exam shows no significant lesions or deformities.  Otoscopic examination reveals clear canals, tympanic membranes are intact bilaterally without bulging, retraction, inflammation or discharge. Hearing is grossly normal bilaterally. Nose:     nasal dischargemucosal pallor, L maxillary sinus tenderness, and R maxillary sinus tenderness.   Mouth:     Oral mucosa and oropharynx without lesions or exudates.  Teeth in good repair. Neck:     supple, full ROM, no masses, and no thyromegaly.   Lungs:     Normal respiratory effort, chest expands symmetrically. Lungs are clear to auscultation, no crackles or wheezes. Heart:     Normal rate and regular rhythm. S1 and S2 normal without gallop, murmur, click, rub or other extra sounds. Abdomen:     soft, non-tender, normal bowel sounds, no hepatomegaly, and no splenomegaly.   Msk:     No deformity or scoliosis noted of thoracic or lumbar spine.   Skin:     turgor normal, color normal, no rashes, no suspicious lesions, no ulcerations, and no edema.   Cervical Nodes:     No lymphadenopathy noted Psych:     Cognition and judgment appear intact. Alert and cooperative with normal attention span and concentration. No apparent delusions, illusions, hallucinations    Impression & Recommendations:  Problem # 1:  COUGH (ICD-786.2) Assessment: New  Orders: T-2 View CXR, Same Day (71020.5TC)   Problem # 2:  ACUTE BRONCHITIS (ICD-466.0) Assessment: New  His updated medication list for this problem includes:    Zyrtec-d Allergy & Congestion 5-120 Mg Tb12 (Cetirizine-pseudoephedrine) .Marland Kitchen..Marland Kitchen Two  times a day    Zithromax 500 Mg Tab (Azithromycin) ..... One by mouth once daily for 3 days    Tussionex Pennkinetic Er 8-10 Mg/44ml Lqcr (Chlorpheniramine-hydrocodone) .Marland KitchenMarland KitchenMarland KitchenMarland Kitchen 5 ml by mouth two times a day as needed cough   Complete Medication List: 1)  Zyrtec-d Allergy & Congestion 5-120 Mg Tb12 (Cetirizine-pseudoephedrine) .... Two times a day 2)  Protonix 40 Mg Pack (Pantoprazole  sodium) .... Every am 3)  Tylenol Sinus  .... As needed 4)  Prevacid 30 Mg Cpdr (Lansoprazole) .Marland Kitchen.. 1   once daily prn 5)  Viagra 100 Mg Tabs (Sildenafil citrate) .Marland Kitchen.. 1 daily as directed 6)  Simvastatin 40 Mg Tabs (Simvastatin) .Marland Kitchen.. 1 by mouth qpm 7)  Zithromax 500 Mg Tab (Azithromycin) .... One by mouth once daily for 3 days 8)  Tussionex Pennkinetic Er 8-10 Mg/66ml Lqcr (Chlorpheniramine-hydrocodone) .... 5 ml by mouth two times a day as needed cough   Patient Instructions: 1)  Please schedule a follow-up appointment in 2 weeks. 2)  Take your antibiotic as prescribed until ALL of it is gone, but stop if you develop a rash or swelling and contact our office as soon as possible. 3)  Acute bronchitis symptoms for less than 10 days are not helped by antibiotics. take over the counter cough medications. call if no improvment in  5-7 days, sooner if increasing cough, fever, or new symptoms( shortness of breath, chest pain). 4)  It is important that you exercise regularly at least 20 minutes 5 times a week. If you develop chest pain, have severe difficulty breathing, or feel very tired , stop exercising immediately and seek medical attention. 5)  You need to lose weight. Consider a lower calorie diet and regular exercise.    Prescriptions: TUSSIONEX PENNKINETIC ER 8-10 MG/5ML LQCR (CHLORPHENIRAMINE-HYDROCODONE) 5 ml by mouth two times a day as needed cough  #3 ounces x 0   Entered and Authorized by:   Etta Grandchild MD   Signed by:   Etta Grandchild MD on 04/09/2008   Method used:   Print then Give to Patient    RxID:   [id number] ZITHROMAX 500 MG TAB (AZITHROMYCIN) one by mouth once daily for 3 days  #3 x 0   Entered and Authorized by:   Etta Grandchild MD   Signed by:   Etta Grandchild MD on 04/09/2008   Method used:   Print then Give to Patient   RxID:   [id number]  ]

## 2010-05-24 NOTE — Assessment & Plan Note (Signed)
Summary: WENT TO [facility] ON 9-15/ HAD A CT/ TOLD TO FU ...   Vital Signs:  Patient profile:   50 year old male Height:      70 inches Weight:      241 pounds BMI:     34.70 O2 Sat:      97 % on Room air Temp:     97.7 degrees F oral Pulse rate:   69 / minute BP sitting:   152 / 88  (left arm) Cuff size:   large  Vitals Entered By: Bill Salinas CMA (January 07, 2010 9:27 AM)  O2 Flow:  Room air CC: pt here for follow up on dizziness and slurred speech. pt was seen at [facility] yesterday/ ab   Primary Care Provider:  Jacques Navy MD  CC:  pt here for follow up on dizziness and slurred speech. pt was seen at [facility] yesterday/ ab.  History of Present Illness: Patient presents after an episode of dizziness, slurred speech and hypertension. He was at work when he noticed that he had some dizziness. Co-workers noted that he had slightly slurred speech. due to his symptoms he was taken to an urgent care. His symptoms cleared quickly and he was told to have follow-up. In the office today he feels fine with o dizziness or weakness or slurred speech. His blood pressure is mildly elevated.  Current Medications (verified): 1)  Zyrtec-D Allergy & Congestion 5-120 Mg  Tb12 (Cetirizine-Pseudoephedrine) .... Two Times A Day 2)  Protonix 40 Mg  Pack (Pantoprazole Sodium) .... Every Am 3)  Tylenol Sinus .... As Needed 4)  Prevacid 30 Mg  Cpdr (Lansoprazole) .Marland Kitchen.. 1   Once Daily Prn 5)  Vytorin 10-80 Mg Tabs (Ezetimibe-Simvastatin) .Marland Kitchen.. 1 By Mouth Once Daily For Cholesterol 6)  Aerochamber W/flowsignal  Misc (Spacer/aero-Holding [place]) .... Use With Inhaler 7)  Levothyroxine Sodium 25 Mcg Tabs (Levothyroxine Sodium) .Marland Kitchen.. 1 By Mouth Once Daily  Allergies (verified): 1)  ! 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    Physical Exam  General:  Heavyset well nourished AA male in no  distress Head:  normocephalic and atraumatic.   Eyes:  vision grossly intact, pupils equal, pupils round, corneas and lenses clear, no optic disk abnormalities, and no retinal abnormalitiies.   Neck:  supple and full ROM.   Lungs:  normal respiratory effort and normal breath sounds.   Heart:  normal rate, regular rhythm, and no murmur.   Msk:  normal ROM.   Pulses:  2+ radial pulse Neurologic:  alert & oriented X3, cranial nerves II-XII intact, strength normal in all extremities, sensation intact to light touch, sensation intact to pinprick, gait normal, DTRs symmetrical and normal, finger-to-nose normal, heel-to-shin normal, and Romberg negative. No dysdiadochokinesia, normal rapid finger  movement, normal tandem gait.   Skin:  turgor normal, color normal, no rashes, and no suspicious lesions.   Cervical Nodes:  no anterior cervical adenopathy and no posterior cervical adenopathy.   Psych:  Oriented X3, memory intact for recent and remote, normally interactive, and good eye contact.     Impression & Recommendations:  Problem # 1:  TIA  (ICD-435.9) By patient's history it appears he may have had a transient neurologic event of a minor nature. I believe he had a negative CT brain. His exam today is non-focal.  Plan - continue aspirin           carotid dopplers           risk modification.  His updated medication list for this problem includes:    Aspir-low 81 Mg Tbec (Aspirin) .Marland Kitchen... 1 by mouth once daily prevention  Orders: Cardiology Referral (Cardiology)  Problem # 2:  HYPERTENSION (ICD-401.9)  His updated medication list for this problem includes:    Lisinopril 10 Mg Tabs (Lisinopril) .Marland Kitchen... 1 by mouth once daily for blood pressure excursions  BP today: 152/88 Prior BP: 128/88 (10/11/2009)  Labs Reviewed: K+: 4.3 (07/20/2009) Creat: : 1.1 (07/20/2009)     Elevated BP today. He has fluctuating BP by history. There are probably frequent excursions in to the elevated range.   Plan - start low dose ACE-I for BP control.           follow-up lab in 3-4 weeks.   Complete Medication List: 1)  Zyrtec-d Allergy & Congestion 5-120 Mg Tb12 (Cetirizine-pseudoephedrine) .... Two times a day 2)  Protonix 40 Mg Pack (Pantoprazole sodium) .... Every am 3)  Tylenol Sinus  .... As needed 4)  Prevacid 30 Mg Cpdr (Lansoprazole) .Marland Kitchen.. 1   once daily prn 5)  Vytorin 10-80 Mg Tabs (Ezetimibe-simvastatin) .Marland Kitchen.. 1 by mouth once daily for cholesterol 6)  Aerochamber W/flowsignal Misc (Spacer/aero-holding chambers) .... Use with inhaler 7)  Levothyroxine Sodium 25 Mcg Tabs (Levothyroxine sodium) .Marland Kitchen.. 1 by mouth once daily 8)  Lisinopril 10 Mg Tabs (Lisinopril) .Marland Kitchen.. 1 by mouth once daily for blood pressure excursions 9)  Aspir-low 81 Mg Tbec (Aspirin) .Marland Kitchen.. 1 by mouth once daily prevention  Patient Instructions: 1)  Dizzy with slurred speech - was this a TIA (mini-stroke)? CT normal, neuro exam normal. There are risk factors present: male, lipids, variable blood pressure. Plan - carotid doppler exam looking for blockages in the carotid  arteries. Aspirin 81mg  daily.  Lisinopril 10 mg  Prescriptions: LISINOPRIL 10 MG TABS (LISINOPRIL) 1 by mouth once daily for blood pressure excursions  #30 x 12   Entered and Authorized by:   Jacques Navy MD   Signed by:   Jacques Navy MD on 01/07/2010   Method used:   Electronically to        [facility] (retail)       [address]       [place], [place]  [address]       Ph: [phone]       Fax: [phone]   RxID:   [id number]

## 2010-05-24 NOTE — Letter (Signed)
   [facility] [address] [place], [place]  [address] Phone: [phone]      March 03, 2010   [facility] [address] [place], [place] [address]  RE:  LAB RESULTS  Dear  Mr. Ladnier,  The following is an interpretation of your most recent lab tests.  Please take note of any instructions provided or changes to medications that have resulted from your lab work.  ELECTROLYTES:  Good - no changes needed  KIDNEY FUNCTION TESTS:  Good - no changes needed  LIVER FUNCTION TESTS:  Stable - no changes needed     Sincerely Yours,    Jacques Navy MD  [facility] Note: All result statuses are Final unless otherwise noted.  Tests: (1) BMP (METABOL)   Sodium                    142 mEq/L                   135-145   Potassium                 4.0 mEq/L                   3.5-5.1   Chloride                  106 mEq/L                   96-112   Carbon Dioxide            29 mEq/L                    19-32   Glucose                   90 mg/dL                    08-65   BUN                       21 mg/dL                    7-84   Creatinine                1.2 mg/dL                   6.9-6.2   Calcium                   9.1 mg/dL                   9.5-28.4   GFR                       85.13 mL/min                >60

## 2010-05-24 NOTE — Consult Note (Signed)
[facility]: [facility]   Imported By: Esmeralda Links D'jimraou 08/30/2007 12:15:18  _____________________________________________________________________  External Attachment:    Type:   Image     Comment:   External Document

## 2010-05-24 NOTE — Assessment & Plan Note (Signed)
Summary: APPT PER FLAG/ SEVERAL PROBLEMS/NWS   Vital Signs:  Patient profile:   50 year old male Height:      70 inches (177.80 cm) Weight:      236 pounds (107.27 kg) BMI:     33.98 O2 Sat:      97 % on Room air Temp:     97.5 degrees F (36.39 degrees C) oral Pulse rate:   86 / minute BP sitting:   138 / 76 Cuff size:   large  Vitals Entered By: Brenton Grills (July 20, 2009 9:29 AM)  O2 Flow:  Room air CC: pt c/o of neck pain/sharp that comes and goes x 1 month/fatigue/pt due for tetanus needs refill on protonix, prevacid/aj   Primary Care Provider:  Jacques Navy MD  CC:  pt c/o of neck pain/sharp that comes and goes x 1 month/fatigue/pt due for tetanus needs refill on protonix and prevacid/aj.  History of Present Illness: Seen in follow-up for thyroid treatment for goiter. He had stopped thyroid hormone due to neck pain more on the right anterior cervical area to under the jaw. Pain continued after stopping the thyroid hormone. He has otherwise been doing relatively well. Work and career planning are a bit stressful. His dad, in [place], is also having some trouble with peripheral edema and probably heart failure.  Current Medications (verified): 1)  Zyrtec-D Allergy & Congestion 5-120 Mg  Tb12 (Cetirizine-Pseudoephedrine) .... Two Times A Day 2)  Protonix 40 Mg  Pack (Pantoprazole Sodium) .... Every Am 3)  Tylenol Sinus .... As Needed 4)  Prevacid 30 Mg  Cpdr (Lansoprazole) .Marland Kitchen.. 1   Once Daily Prn 5)  Simvastatin 40 Mg  Tabs (Simvastatin) .Marland Kitchen.. 1 By Mouth Qpm 6)  Aerochamber W/flowsignal  Misc (Spacer/aero-Holding [place]) .... Use With Inhaler 7)  Levothyroxine Sodium 25 Mcg Tabs (Levothyroxine Sodium) .Marland Kitchen.. 1 By Mouth Once Daily  Allergies (verified): 1)  ! 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) HYPERTENSION (ICD-401.9) HYPERLIPIDEMIA (ICD-272.4) GERD (ICD-530.81)  Past Surgical History: Last updated: 07/24/2007 Appendectomy Right Knee arthroscopy right shoulder partial replacement for AVN - Jan '08 Madelon Lips)  Family History: Last updated: 12/25/2008 father - '25: HTN, CAD/CHF,  mother -deceased @ 50: DM, Lipids, HTN Neg- colon cancer, prostate cancer Pos - family h/o maternal side for CAD/MI   heart disease: mother (m.i.), maternal aunt cancer father (mesothelioma)   Social History: Last updated: 12/25/2008 HSG, [employer] school in [place] married '84 - 5 years divorced; remarried '96 1 son -'22 work: Patent examiner. Current smoker.  1 to 2 cigs per day.  started at age 53 Alcohol use-no Drug use-no Regular exercise-no  Risk Factors: Alcohol Use: <1 (07/24/2007) Caffeine Use: 3 (07/24/2007) Exercise: no (04/09/2008)  Risk Factors: Smoking Status: never (04/09/2008) Packs/Day: 1-5cigs (07/24/2007) Passive Smoke Exposure: no (04/09/2008)  Review of Systems  The patient denies anorexia, fever, weight loss, weight gain, chest pain, syncope, dyspnea on exertion, headaches, hemoptysis, and abdominal pain.    Physical Exam  General:  WNWD AA male in no distress Head:  Normocephalic and atraumatic without obvious abnormalities. No apparent alopecia or balding. Eyes:  vision grossly intact, pupils equal, pupils round, corneas and lenses clear, and no injection.  Neck:  supple.  Easily palpalbe enlarged thyroid, right more prominent then left, tender to palpation more on the right. No anterior cerviacl chain lymphadenopathy, no enlargement of the submandibular glands.  Lungs:  normal respiratory effort and normal breath sounds.   Heart:   normal rate and regular rhythm.   Pulses:  2+ radial Neurologic:  alert & oriented X3, cranial nerves II-XII intact, and gait normal.   Skin:  turgor normal, color normal, and no ulcerations.   Cervical Nodes:  no anterior cervical adenopathy and no posterior cervical adenopathy.   Psych:  Oriented X3, memory intact for recent and remote, and normally interactive.     Impression & Recommendations:  Problem # 1:  NONTOXIC MULTINODULAR GOITER (ICD-241.1) Patinet with enlarged thyroid. Not sure if enlarged over last exam, but there is concern due to tenderness. He may have recurrent throid cysts, previous FNA path report reviewed, vs thyroiditis.  Plan - thyroid function labs.           ASA 325 mg two times a day for pain           Thyroid u/s for f/u of goiter.   Orders: TLB-TSH (Thyroid Stimulating Hormone) ([id number]-TSH) TLB-T4 (Thyrox), Free [phone]) TLB-T3 Uptake [phone]) Radiology Referral (Radiology)  Addendum - thyroid functions are normal  Problem # 2:  HYPERTENSION (ICD-401.9)  BP today: 138/76 Prior BP: 120/68 (01/18/2009)  Adequate control of BP. Labs are noraml  Plan - continue present regimen  Problem # 3:  HYPERLIPIDEMIA (ICD-272.4) Assessment: New Due for follow up lab. Recommendations to follow.  His updated medication list for this problem includes:    Vytorin 10-80 Mg Tabs (Ezetimibe-simvastatin) .Marland Kitchen... 1 by mouth once daily for cholesterol  Orders: TLB-Lipid Panel (80061-LIPID) TLB-Hepatic/Liver Function Pnl (80076-HEPATIC)  Addendum - very poor control with LDL 209!!!! Raises question of medication adherence.  Plan - if taking medication will then change to vytorin 10/80 for better control. (Rx enclosed)           follow-up lab in 4 weeks.   Complete Medication List: 1)  Zyrtec-d Allergy & Congestion 5-120 Mg Tb12 (Cetirizine-pseudoephedrine) .... Two times a day 2)  Protonix 40 Mg Pack (Pantoprazole sodium) .... Every am 3)  Tylenol Sinus   .... As needed 4)  Prevacid 30 Mg Cpdr (Lansoprazole) .Marland Kitchen.. 1   once daily prn 5)  Vytorin 10-80 Mg Tabs (Ezetimibe-simvastatin) .Marland Kitchen.. 1 by mouth once daily for cholesterol 6)  Aerochamber W/flowsignal Misc (Spacer/aero-holding chambers) .... Use with inhaler 7)  Levothyroxine Sodium 25 Mcg Tabs (Levothyroxine sodium) .Marland Kitchen.. 1 by mouth once daily  Other Orders: TLB-BMP (Basic Metabolic Panel-BMET) (80048-METABOL)  atient: [name] Note: All result statuses are Final unless otherwise noted.  Tests: (1) TSH (TSH)   FastTSH                   0.62 uIU/mL                 0.35-5.50  Tests: (2) T4, Free (FT4R)   Free T4                   0.9 ng/dL                   0.1-0.2  Tests: (3) T3 Uptake (T3UP)   T3 Uptake                 35.3 %  22.5-37.0  Tests: (4) Lipid Panel (LIPID)   Cholesterol          [H]  266 mg/dL                   5-638     ATP III Classification            Desirable:  < 200 mg/dL                    Borderline High:  200 - 239 mg/dL               High:  > = 240 mg/dL   Triglycerides             76.0 mg/dL                  7.5-643.3     Normal:  <150 mg/dL     Borderline High:  295 - 199 mg/dL   HDL                       18.84 mg/dL                 >16.60   VLDL Cholesterol          15.2 mg/dL                  6.3-01.6  CHO/HDL Ratio:  CHD Risk                             5                    Men          Women     1/2 Average Risk     3.4          3.3     Average Risk          5.0          4.4     2X Average Risk          9.6          7.1     3X Average Risk          15.0          11.0                           Tests: (5) Hepatic/Liver Function Panel (HEPATIC)   Total Bilirubin           0.9 mg/dL                   0.1-0.9   Direct Bilirubin          0.1 mg/dL                   3.2-3.5   Alkaline Phosphatase      40 U/L                      39-117   AST                       25 U/L                      0-37   ALT  22 U/L                      0-53   Total Protein             7.4 g/dL                    1.6-1.0   Albumin                   4.0 g/dL                    9.6-0.4  Tests: (6) BMP (METABOL)   Sodium                    143 mEq/L                   135-145   Potassium                 4.3 mEq/L                   3.5-5.1   Chloride                  105 mEq/L                   96-112   Carbon Dioxide            30 mEq/L                    19-32   Glucose                   96 mg/dL                    54-09   BUN                       17 mg/dL                    8-11   Creatinine                1.1 mg/dL                   9.1-4.7   Calcium                   9.3 mg/dL                   8.2-95.6   GFR                       92.56 mL/min                >60  Tests: (7) Cholesterol LDL - Direct (DIRLDL)  Cholesterol LDL - Direct                             209.8 mg/dLPrescriptions: VYTORIN 10-80 MG TABS (EZETIMIBE-SIMVASTATIN) 1 by mouth once daily for cholesterol  #30 x 12   Entered and Authorized by:   Jacques Navy MD   Signed by:   Jacques Navy MD on 07/21/2009   Method used:   Print then Give to Patient   RxID:   [id number] PREVACID 30 MG  CPDR (LANSOPRAZOLE) 1   once daily prn  #90 x  3   Entered and Authorized by:   Jacques Navy MD   Signed by:   Jacques Navy MD on 07/20/2009   Method used:   Electronically to        [facility] [address] [address] (retail)       [address]       [place], [place]  [address]       Ph: [phone]       Fax: [phone]   RxID:   [id number] PROTONIX 40 MG  PACK (PANTOPRAZOLE SODIUM) every AM  #90 x 3   Entered and Authorized by:   Jacques Navy MD   Signed by:   Jacques Navy MD on 07/20/2009   Method used:   Electronically to        [facility] [address] [address] (retail)       [address]       [place], [place]  [address]       Ph: [phone]       Fax:  [phone]   RxID:   [id number]

## 2010-05-24 NOTE — Progress Notes (Signed)
  Phone Note Call from Patient Call back at [facility] Phone [phone]   Caller: Patient Summary of Call: PT IS REQUESTING THAT DR NORINS REVIEW PT'S RECENT MRI B/C THE ORDERING DR IS GONE FOR A WEEK. IS THIS A POSSIBILITY? Initial call taken by: Lamar Sprinkles,  February 13, 2007 5:57 PM  Follow-up for Phone Call        I will review the MRI and use phone tree to get back to the patient Follow-up by: Jacques Navy MD,  February 13, 2007 6:32 PM  Additional Follow-up for Phone Call Additional follow up Details #1::        CALLED PT ON [phone] PT INFORMED OF ABOVE Additional Follow-up by: Lamar Sprinkles,  February 13, 2007 6:53 PM

## 2010-05-26 ENCOUNTER — Inpatient Hospital Stay: Admission: RE | Admit: 2010-05-26 | Payer: Self-pay | Source: Home / Self Care | Admitting: Orthopedic Surgery

## 2010-05-26 ENCOUNTER — Ambulatory Visit (HOSPITAL_COMMUNITY): Payer: Commercial Managed Care - PPO

## 2010-05-26 ENCOUNTER — Inpatient Hospital Stay (HOSPITAL_COMMUNITY)
Admission: RE | Admit: 2010-05-26 | Discharge: 2010-05-29 | DRG: 517 | Disposition: A | Discharge status: Home / Self Care | Payer: Commercial Managed Care - PPO | Source: Ambulatory Visit | Attending: Orthopedic Surgery | Admitting: Orthopedic Surgery

## 2010-05-26 DIAGNOSIS — Y92009 Unspecified place in unspecified non-institutional (private) residence as the place of occurrence of the external cause: Secondary | ICD-10-CM

## 2010-05-26 DIAGNOSIS — Z96619 Presence of unspecified artificial shoulder joint: Secondary | ICD-10-CM

## 2010-05-26 DIAGNOSIS — E079 Disorder of thyroid, unspecified: Secondary | ICD-10-CM | POA: Diagnosis present

## 2010-05-26 DIAGNOSIS — F17200 Nicotine dependence, unspecified, uncomplicated: Secondary | ICD-10-CM | POA: Diagnosis present

## 2010-05-26 DIAGNOSIS — K219 Gastro-esophageal reflux disease without esophagitis: Secondary | ICD-10-CM | POA: Diagnosis present

## 2010-05-26 DIAGNOSIS — T84099A Other mechanical complication of unspecified internal joint prosthesis, initial encounter: Principal | ICD-10-CM | POA: Diagnosis present

## 2010-05-26 DIAGNOSIS — I10 Essential (primary) hypertension: Secondary | ICD-10-CM | POA: Diagnosis present

## 2010-05-26 DIAGNOSIS — Y831 Surgical operation with implant of artificial internal device as the cause of abnormal reaction of the patient, or of later complication, without mention of misadventure at the time of the procedure: Secondary | ICD-10-CM | POA: Diagnosis present

## 2010-05-26 DIAGNOSIS — M19019 Primary osteoarthritis, unspecified shoulder: Secondary | ICD-10-CM | POA: Diagnosis present

## 2010-05-26 HISTORY — PX: SHOULDER HEMI-ARTHROPLASTY: SHX5049

## 2010-05-26 LAB — CBC
HCT: 43.2 % (ref 39.0–52.0)
Hemoglobin: 14.7 g/dL (ref 13.0–17.0)
MCH: 30.1 pg (ref 26.0–34.0)
MCHC: 34 g/dL (ref 30.0–36.0)
MCV: 88.5 fL (ref 78.0–100.0)
Platelets: 174 10*3/uL (ref 150–400)
RBC: 4.88 MIL/uL (ref 4.22–5.81)
RDW: 12.6 % (ref 11.5–15.5)
WBC: 7.8 10*3/uL (ref 4.0–10.5)

## 2010-05-26 LAB — BASIC METABOLIC PANEL
BUN: 14 mg/dL (ref 6–23)
CO2: 26 mEq/L (ref 19–32)
Calcium: 9.3 mg/dL (ref 8.4–10.5)
Chloride: 105 mEq/L (ref 96–112)
Creatinine, Ser: 1.06 mg/dL (ref 0.4–1.5)
GFR calc Af Amer: >60 mL/min (ref >60)
GFR calc non Af Amer: >60 mL/min (ref >60)
Glucose, Bld: 96 mg/dL (ref 70–99)
Potassium: 4.3 mEq/L (ref 3.5–5.1)
Sodium: 138 mEq/L (ref 135–145)

## 2010-05-26 LAB — SURGICAL PCR SCREEN
MRSA, PCR: NEGATIVE
Staphylococcus aureus: NEGATIVE

## 2010-05-26 NOTE — Letter (Signed)
Summary: Email regarding medications/Patient  Email regarding medications/Patient   Imported By: Sherian Rein 05/05/2010 11:03:29  _____________________________________________________________________  External Attachment:    Type:   Image     Comment:   External Document

## 2010-05-26 NOTE — Progress Notes (Signed)
  Phone Note Call from Patient   Summary of Call: Patient sent e-mail to report sexual function problems since starting blood pressure medication - lisinopril. Plan -hold lisinopril x 2 weeks to see if normal sexual function returns. Then start taztia XT 180 mg once daily.  Initial call taken by: Jacques Navy MD,  April 04, 2010 9:36 AM

## 2010-05-26 NOTE — Progress Notes (Signed)
Summary: CALL BACK   Phone Note Call from Patient Call back at [phone]   Summary of Call: Patient is requesting a call back, has questions about last med prescribed.  Initial call taken by: Lamar Sprinkles, CMA,  April 01, 2010 11:19 AM  Follow-up for Phone Call        left mess to call office back Monday Follow-up by: Lamar Sprinkles, CMA,  April 01, 2010 2:48 PM  Additional Follow-up for Phone Call Additional follow up Details #1::        See other phone note from 12/12, pt emailed MD Additional Follow-up by: Lamar Sprinkles, CMA,  April 07, 2010 6:06 PM

## 2010-05-27 LAB — BASIC METABOLIC PANEL
BUN: 9 mg/dL (ref 6–23)
CO2: 25 mEq/L (ref 19–32)
Calcium: 8.4 mg/dL (ref 8.4–10.5)
Chloride: 102 mEq/L (ref 96–112)
Creatinine, Ser: 1.02 mg/dL (ref 0.4–1.5)
GFR calc Af Amer: >60 mL/min (ref >60)
GFR calc non Af Amer: >60 mL/min (ref >60)
Glucose, Bld: 221 mg/dL — ABNORMAL HIGH (ref 70–99)
Potassium: 3.4 mEq/L — ABNORMAL LOW (ref 3.5–5.1)
Sodium: 137 mEq/L (ref 135–145)

## 2010-05-27 LAB — CBC
HCT: 39.3 % (ref 39.0–52.0)
Hemoglobin: 13.2 g/dL (ref 13.0–17.0)
MCH: 30 pg (ref 26.0–34.0)
MCHC: 33.6 g/dL (ref 30.0–36.0)
MCV: 89.3 fL (ref 78.0–100.0)
Platelets: 154 10*3/uL (ref 150–400)
RBC: 4.4 MIL/uL (ref 4.22–5.81)
RDW: 12.8 % (ref 11.5–15.5)
WBC: 8.4 10*3/uL (ref 4.0–10.5)

## 2010-05-27 NOTE — Letter (Signed)
Summary: [name]   Imported By: Esmeralda Links D'jimraou 03/12/2007 09:31:14  _____________________________________________________________________  External Attachment:    Type:   Image     Comment:   External Document

## 2010-06-07 NOTE — Op Note (Signed)
NAMECLARENCE, [name]            ACCOUNT NO.:  [id number]  MEDICAL RECORD NO.:  [id number]           PATIENT TYPE:  I  LOCATION:  5002                         FACILITY:  [facility]  PHYSICIAN:  Jones Broom, MD    DATE OF BIRTH:  1961-03-10  DATE OF PROCEDURE:  05/26/2010 DATE OF DISCHARGE:                              OPERATIVE REPORT   PREOPERATIVE DIAGNOSES: 1. Failed right shoulder hemiarthroplasty. 2. Right shoulder acromioclavicular joint degenerative joint disease.  POSTOPERATIVE DIAGNOSES: 1. Failed right shoulder hemiarthroplasty. 2. Right shoulder acromioclavicular joint degenerative joint disease.  PROCEDURES PERFORMED: 1. Revision of right shoulder hemiarthroplasty with removal of deep     implant. 2. Right shoulder open distal clavicle excision.  ANESTHESIA:  GETA with preoperative interscalene block.  ATTENDING SURGEONS:  Jones Broom, MD  ASSISTANT:  Laural Benes. Su Hilt, PA-C  COMPLICATIONS:  None.  DRAINS:  One medium Hemovac.  SPECIMENS:  The implants were removed and retained bone was discarded.  ESTIMATED BLOOD LOSS:  250 mL.  INDICATIONS FOR SURGERY:  The patient is a healthy 50 year old gentleman who had a right shoulder hemiarthroplasty for AVN back in 2008.  He initially did well after the surgery.  However, he had a fall down the steps approximately 6 months ago.  After that fall, he had pain in the shoulder which had a bit better, but then worse with conservative management.  He had impingement signs, which was felt to be due to the high-riding humeral head and AC joint symptoms.  He was indicated for revision hemiarthroplasty to restore more and more anatomic joint and try and decrease the tenting of the rotator cuff.  He understood the risks, benefits and alternatives to the surgery including but not limited to risk of bleeding, infection, damage to neurovascular structure, fracture, stiffness and potential need for future surgery. He  understood all of this and elected to go forward with surgery.  OPERATIVE FINDINGS:  Examination under anesthesia demonstrated range of motion with 45 degrees external rotation, forward elevation to 180 with no instability.  The implant was removed.  The head was taken off the [facility] taper and the broach handle was used with a back slap hammer to extract the implant. It required some use of an osteotome proximally to free up the proximal metaphyseal fit.  It was removed without any damage to the bone.  He was noted to have extensive amount of the head remaining that was not initially, cut with the first procedure.  Therefore, an anatomic neck cut was made and new implant was placed with a 12 stem and a 52 x 21 head.  It was felt that the anatomic reconstruction had been achieved. The rotator cuff including the subscapularis was healthy appearing and completely intact.  The glenoid surface was pristine with healthy- appearing cartilage, which was soft.  PROCEDURE:  The patient was identified in the preoperative holding area where I personally marked the operative site after verifying site, side and procedure with the patient.  He received an interscalene block given by the attending anesthesiologist, which was felt to be successful.  He was taken back to the operating room and general  anesthesia was induced. He was placed in the beach-chair position with the back elevated approximately 30 degrees.  Head and neck and opposite extremity as well as the legs were carefully padded and positioned.  Right upper extremity was prepped and draped in standard sterile fashion.  The appropriate time-out procedure was carried out verifying site, side and procedure. The previous incision was marked out.  The superior aspect of the incision was used, but inferiorly I deviated slightly lateral along the course of the proximal humerus.  Dissection was carried down to subcutaneous tissues, which was  dissected to the level of the cephalic vein, which was identified and preserved throughout the procedure.  It was taken laterally with the deltoid.  The pectoralis major was retracted medially while the deltoid retractor was retracted laterally. Given the revision nature of the procedure, there was small scar tissue in the interval; however, there was not a significant amount of subdeltoid scarring.  The subdeltoid space was carefully explored and connected to the subacromial space.  The conjoined tendon was identified and the plane between the conjoined tendon and pectoralis major was developed.  The plane underneath the conjoined tendon was then developed above the subscapularis and a Cobra retractor was placed here after verifying that the musculocutaneous nerve was not in the field.  The subscapularis was carefully examined, found to be completely intact and healthy appearing.  I visualized the previously placed Ethibond sutures from the subscapularis repair.  The tendon was stout and healthy.  The biceps tendon was still intact.  I opened the bicipital sheath and transected the biceps tendon at the base of the glenoid.  The biceps was then tenodesed to the soft tissue just above the pectoralis major with two #2 Ethibond sutures.  The remaining portion of the tendon was cut and discarded.  A large-curved osteotome was then used to carry out an osteotomy at a lesser tuberosity.  The subscapularis and capsule were then all elevated as one sheath along the neck with progressive external rotation taking care to carefully protect the axillary nerve.  The capsule was released for approximately a centimeter and half along the neck all the way to the 6 o'clock position inferiorly.  At this point, it was felt that the shoulder could be dislocated and it was dislocated using adduction, extension and external rotation.  The head was exposed and noted to have significant amount of native cartilage  head still remaining.  The posterior-inferior portion of the head showed some collapse of unclear chronicity.  The implant head was knocked off the [facility] taper and set aside.  The fibrous covering over the implant was removed and the top part of the implant was exposed using small rongeur and 0.25-inch osteotome.  The broach handle was attached to the implant and a back slap hammer was attached.  The implant was removed without significant difficulty and no damage to the bone.  At this point, the anatomic neck cut was marked.  The posterior cuff was palpated and great care was taken to try and recreate the native version at the capsular reflection.  Cut was made at the 135 angle.  The canal was then reamed from 6 mm up to 12 mm with good fill.  The 12-mm box osteotome and broach were then used to prepare the canal.  The glenoid was then exposed using a Fukuda retractor posteriorly and anterior glenoid retractor.  It was carefully examined and probed and felt to be intact cartilage, which was pristine with  nowhere that was perceivable.  I felt that it was not worth resurfacing the glenoid at this junction.  The shoulder was again dislocated and the trial broach implant was placed. A trial head of 52 x 21 was felt to be the appropriate size.  The joint was reduced and felt to be excellent tension and sizing.  The final implant was then put together on the back table and impacted in a press- fit fashion.  Excellent fixation was noted.  A #2 FiberWire was placed around the neck of the implant to assist in the subscapularis repair. The joint was reduced and copiously irrigated with pulse lavage.  The subscapularis and lesser tuberosity was then repaired using three #2 FiberWires through bone tunnels beneath the bicipital groove and medial row with the FiberWire around the neck of the implant.  One #2 Ethibond was placed in a figure-of-eight fashion on the rotator interval. Anatomic  reconstruction was noted.  At the conclusion of the procedure, I could easily externally rotate the arm to 45 degrees, forward elevate to 160.  There was no undue tension on the subscapularis repair with this motion.  At this point, again copious irrigation was used and the [facility] joint was exposed subcutaneously through the incision extending the incision proximally another centimeter to gain exposure.  A longitudinal split was made over the [facility] joint exposing the distal clavicle.  Crego retractors were placed anterior and posterior and the distal 1 cm of the clavicle was excised with a sagittal saw.  This was copiously irrigated and the capsule and fascia was then repaired using two #2 Ethibond sutures through the dorsal cortex of the distal cut clavicle and through the fascia bringing it together.  The remainder of the fascia was closed with a running #2 Ethibond.  Again, everything was copiously irrigated and the drain was placed out the deltoid insertion laterally.  Layered closure was then carried out with 2-0 Vicryl in a deep dermal layer, 4-0 Monocryl for skin closure and Steri-Strips.  Sterile dressings were then applied including 4x4s, ABDs, and tape.  The patient was then allowed to awaken from general anesthesia, transferred to the stretcher and taken to the recovery room in stable condition.  POSTOPERATIVE PLAN:  He will be kept overnight tonight and likely another night in the hospital for pain control and for occupational therapy.  He will be discharged home with his family after he was taught the appropriate exercises and follow up with me in 10-14 days.     Jones Broom, MD     JC/MEDQ  D:  05/26/2010  T:  05/27/2010  Job:  [id number]  Electronically Signed by Jones Broom  on 06/07/2010 11:15:08 AM

## 2010-06-21 ENCOUNTER — Emergency Department (INDEPENDENT_AMBULATORY_CARE_PROVIDER_SITE_OTHER): Payer: Commercial Managed Care - PPO

## 2010-06-21 ENCOUNTER — Emergency Department (HOSPITAL_BASED_OUTPATIENT_CLINIC_OR_DEPARTMENT_OTHER)
Admission: EM | Admit: 2010-06-21 | Discharge: 2010-06-21 | Disposition: A | Discharge status: Home / Self Care | Payer: Commercial Managed Care - PPO | Attending: Emergency Medicine | Admitting: Emergency Medicine

## 2010-06-21 ENCOUNTER — Encounter (HOSPITAL_BASED_OUTPATIENT_CLINIC_OR_DEPARTMENT_OTHER): Payer: Self-pay | Admitting: Radiology

## 2010-06-21 DIAGNOSIS — R42 Dizziness and giddiness: Secondary | ICD-10-CM | POA: Insufficient documentation

## 2010-06-21 DIAGNOSIS — R5381 Other malaise: Secondary | ICD-10-CM | POA: Insufficient documentation

## 2010-06-21 DIAGNOSIS — R0602 Shortness of breath: Secondary | ICD-10-CM

## 2010-06-21 DIAGNOSIS — R5383 Other fatigue: Secondary | ICD-10-CM | POA: Insufficient documentation

## 2010-06-21 DIAGNOSIS — F17200 Nicotine dependence, unspecified, uncomplicated: Secondary | ICD-10-CM | POA: Insufficient documentation

## 2010-06-21 DIAGNOSIS — R799 Abnormal finding of blood chemistry, unspecified: Secondary | ICD-10-CM | POA: Insufficient documentation

## 2010-06-21 DIAGNOSIS — R911 Solitary pulmonary nodule: Secondary | ICD-10-CM

## 2010-06-21 DIAGNOSIS — E042 Nontoxic multinodular goiter: Secondary | ICD-10-CM

## 2010-06-21 DIAGNOSIS — I10 Essential (primary) hypertension: Secondary | ICD-10-CM | POA: Insufficient documentation

## 2010-06-21 DIAGNOSIS — Z79899 Other long term (current) drug therapy: Secondary | ICD-10-CM | POA: Insufficient documentation

## 2010-06-21 HISTORY — DX: Essential (primary) hypertension: I10

## 2010-06-21 LAB — COMPREHENSIVE METABOLIC PANEL WITH GFR
ALT: 25 U/L (ref 0–53)
AST: 32 U/L (ref 0–37)
Albumin: 4.6 g/dL (ref 3.5–5.2)
Alkaline Phosphatase: 62 U/L (ref 39–117)
BUN: 13 mg/dL (ref 6–23)
CO2: 27 meq/L (ref 19–32)
Calcium: 9.7 mg/dL (ref 8.4–10.5)
Chloride: 103 meq/L (ref 96–112)
Creatinine, Ser: 0.9 mg/dL (ref 0.4–1.5)
GFR calc non Af Amer: >60 mL/min
Glucose, Bld: 118 mg/dL — ABNORMAL HIGH (ref 70–99)
Potassium: 4.7 meq/L (ref 3.5–5.1)
Sodium: 140 meq/L (ref 135–145)
Total Bilirubin: 0.6 mg/dL (ref 0.3–1.2)
Total Protein: 8.3 g/dL (ref 6.0–8.3)

## 2010-06-21 LAB — CBC
HCT: 41.8 % (ref 39.0–52.0)
Hemoglobin: 14.7 g/dL (ref 13.0–17.0)
MCH: 30.2 pg (ref 26.0–34.0)
MCHC: 35.2 g/dL (ref 30.0–36.0)
MCV: 86 fL (ref 78.0–100.0)
Platelets: 194 K/uL (ref 150–400)
RBC: 4.86 MIL/uL (ref 4.22–5.81)
RDW: 12 % (ref 11.5–15.5)
WBC: 12.5 K/uL — ABNORMAL HIGH (ref 4.0–10.5)

## 2010-06-21 LAB — DIFFERENTIAL
Basophils Absolute: 0 K/uL (ref 0.0–0.1)
Basophils Relative: 0 % (ref 0–1)
Eosinophils Absolute: 0 K/uL (ref 0.0–0.7)
Eosinophils Relative: 0 % (ref 0–5)
Lymphocytes Relative: 21 % (ref 12–46)
Lymphs Abs: 2.6 K/uL (ref 0.7–4.0)
Monocytes Absolute: 1 K/uL (ref 0.1–1.0)
Monocytes Relative: 8 % (ref 3–12)
Neutro Abs: 8.9 K/uL — ABNORMAL HIGH (ref 1.7–7.7)
Neutrophils Relative %: 71 % (ref 43–77)

## 2010-06-21 LAB — POCT CARDIAC MARKERS
CKMB, poc: 1.4 ng/mL (ref 1.0–8.0)
Myoglobin, poc: 70.9 ng/mL (ref 12–200)
Troponin i, poc: <0.05 ng/mL (ref 0.00–0.09)

## 2010-06-21 LAB — D-DIMER, QUANTITATIVE

## 2010-06-21 MED ORDER — IOHEXOL 350 MG/ML SOLN
80.0000 mL | Freq: Once | INTRAVENOUS | Status: AC | PRN
  Administered 2010-06-21: 80 mL via INTRAVENOUS

## 2010-06-30 ENCOUNTER — Ambulatory Visit (INDEPENDENT_AMBULATORY_CARE_PROVIDER_SITE_OTHER): Payer: Commercial Managed Care - PPO | Admitting: Internal Medicine

## 2010-06-30 ENCOUNTER — Encounter: Payer: Self-pay | Admitting: Internal Medicine

## 2010-06-30 DIAGNOSIS — I10 Essential (primary) hypertension: Secondary | ICD-10-CM

## 2010-07-05 NOTE — Assessment & Plan Note (Signed)
Summary: post ER f/u/cd   Vital Signs:  Patient profile:   50 year old male Height:      70 inches Weight:      226 pounds BMI:     32.54 O2 Sat:      98 % on Room air Temp:     98.9 degrees F oral Pulse rate:   95 / minute BP sitting:   142 / 98  (left arm) Cuff size:   large  Vitals Entered By: Bill Salinas CMA (June 30, 2010 9:09 AM)  O2 Flow:  Room air CC: hosp f/u/ab   Primary Care Provider:  Jacques Navy MD  CC:  hosp f/u/ab.  History of Present Illness: Mr. Vanwart had re-do reconstruction of the right shoulder with no humeral head and implant- operative report reviewed- May 27, 2010. He had a great deal of pain post-op and after discharge. He was feeling really bad 2/28 and went to ED. Note reviewed: normal exam except for shoulder, nl cxr, nl EKG, negative cardiac enzymes, D-dimer minimally elevated but CT angio chest was negative for PE. At admission to ED BP 178-116.   Since the 28th he is feeling better; no recurrent near-syncope and weakness. He is still having some pain but he is able to sleep .   Current Medications (verified): 1)  Zyrtec-D Allergy & Congestion 5-120 Mg  Tb12 (Cetirizine-Pseudoephedrine) .... Two Times A Day 2)  Protonix 40 Mg  Pack (Pantoprazole Sodium) .... Every Am 3)  Tylenol Sinus .... As Needed 4)  Prevacid 30 Mg  Cpdr (Lansoprazole) .Marland Kitchen.. 1   Once Daily Prn 5)  Vytorin 10-80 Mg Tabs (Ezetimibe-Simvastatin) .Marland Kitchen.. 1 By Mouth Once Daily For Cholesterol 6)  Aerochamber W/flowsignal  Misc (Spacer/aero-Holding [place]) .... Use With Inhaler 7)  Levothyroxine Sodium 25 Mcg Tabs (Levothyroxine Sodium) .Marland Kitchen.. 1 By Mouth Once Daily 8)  Taztia Xt 180 Mg Xr24h-Cap (Diltiazem Hcl Er Beads) .Marland Kitchen.. 1 By Mouth Once Daily 9)  Aspir-Low 81 Mg Tbec (Aspirin) .Marland Kitchen.. 1 By Mouth Once Daily Prevention 10)  Oxycodone-Acetaminophen 5-325 Mg Tabs (Oxycodone-Acetaminophen) .Marland Kitchen.. 1-2 Tablets Every 4 Hours As Needed  Allergies (verified): 1)  ! * Tequin  Past  History:  Past Surgical History: Appendectomy Right Knee arthroscopy right shoulder partial replacement for AVN - Jan '08 [facility]) reight shoulder redo-reconstruction Jun 21, 2010  Family History: father - '25-deceased Oct '11-sudden death.: HTN, CAD/CHF,  mother -deceased @ 18: DM, Lipids, HTN Neg- colon cancer, prostate cancer Pos - family h/o maternal side for CAD/MI   heart disease: mother (m.i.), maternal aunt cancer father (mesothelioma)   Review of Systems  The patient denies anorexia, fever, weight loss, weight gain, decreased hearing, chest pain, syncope, dyspnea on exertion, headaches, abdominal pain, severe indigestion/heartburn, muscle weakness, difficulty walking, and enlarged lymph nodes.    Physical Exam  General:  alert, well-developed, well-nourished, and well-hydrated AA male with right arm in a sling in no acute distress.   Head:  normocephalic and atraumatic.   Eyes:  pupils equal and pupils round.  C&SD clear Neck:  supple.   Lungs:  normal respiratory effort.   Heart:  normal rate and regular rhythm.   Msk:  right shoulder immobilized postop Pulses:  2+ left radial pulse Neurologic:  alert & oriented X3, cranial nerves II-XII intact, strength normal in all extremities, and gait normal.   Skin:  turgor normal and color normal.   Psych:  Oriented X3, memory intact for recent and remote, normally interactive,  and good eye contact.     Impression & Recommendations:  Problem # 1:  HYPERTENSION (ICD-401.9) Ptient with a recent episode of weakness and lightheadedness most likely due to pain, lack of sleep and weakness. His eval at [facility] [address] was normal. He has been feeling better. Chart reviewed and his blood pressure has been well controlled on his present regimen.  Plan - he is to obtain a BP device and check pressure. If SBP 140+, DBP 90+ he will report back            for elevated BP will increase diltiazem to 240.  His updated medication list  for this problem includes:    Taztia Xt 180 Mg Xr24h-cap (Diltiazem hcl er beads) .Marland Kitchen... 1 by mouth once daily  Complete Medication List: 1)  Zyrtec-d Allergy & Congestion 5-120 Mg Tb12 (Cetirizine-pseudoephedrine) .... Two times a day 2)  Protonix 40 Mg Pack (Pantoprazole sodium) .... Every am 3)  Tylenol Sinus  .... As needed 4)  Prevacid 30 Mg Cpdr (Lansoprazole) .Marland Kitchen.. 1   once daily prn 5)  Vytorin 10-80 Mg Tabs (Ezetimibe-simvastatin) .Marland Kitchen.. 1 by mouth once daily for cholesterol 6)  Aerochamber W/flowsignal Misc (Spacer/aero-holding chambers) .... Use with inhaler 7)  Levothyroxine Sodium 25 Mcg Tabs (Levothyroxine sodium) .Marland Kitchen.. 1 by mouth once daily 8)  Taztia Xt 180 Mg Xr24h-cap (Diltiazem hcl er beads) .Marland Kitchen.. 1 by mouth once daily 9)  Aspir-low 81 Mg Tbec (Aspirin) .Marland Kitchen.. 1 by mouth once daily prevention 10)  Oxycodone-acetaminophen 5-325 Mg Tabs (Oxycodone-acetaminophen) .Marland Kitchen.. 1-2 tablets every 4 hours as needed Prescriptions: VYTORIN 10-80 MG TABS (EZETIMIBE-SIMVASTATIN) 1 by mouth once daily for cholesterol  #30 x 12   Entered and Authorized by:   Jacques Navy MD   Signed by:   Jacques Navy MD on 06/30/2010   Method used:   Electronically to        [facility] [address] (retail)       [address]       [place], [place]  [address]       Ph: [phone]       Fax: [phone]   RxID:   [id number] PREVACID 30 MG  CPDR (LANSOPRAZOLE) 1   once daily prn  #90 Capsule x 2   Entered and Authorized by:   Jacques Navy MD   Signed by:   Jacques Navy MD on 06/30/2010   Method used:   Electronically to        [facility] [address] (retail)       [address]       [place], [place]  [address]       Ph: [phone]       Fax: [phone]   RxID:   [id number] PROTONIX 40 MG  PACK (PANTOPRAZOLE SODIUM) every AM  #90 x 2   Entered and Authorized by:   Jacques Navy MD   Signed by:   Jacques Navy MD on 06/30/2010   Method used:   Electronically to        [facility] [address] [address] (retail)       [address]       [place], [place]  [address]       Ph: [phone]  Fax: [phone]   RxID:   [id number]    Orders Added: 1)  Est. Patient Level IV [[address]]

## 2010-07-13 NOTE — Discharge Summary (Signed)
  NAMEVELMER, [name]            ACCOUNT NO.:  [id number]  MEDICAL RECORD NO.:  [id number]           PATIENT TYPE:  I  LOCATION:  5002                         FACILITY:  [facility]  PHYSICIAN:  Jones Broom, MD    DATE OF BIRTH:  [birth year]  DATE OF ADMISSION:  05/26/2010 DATE OF DISCHARGE:  05/29/2010                              DISCHARGE SUMMARY   FINAL DIAGNOSIS:  Failed right shoulder hemiarthroplasty.  PRINCIPAL PROCEDURE:  Revision right shoulder hemiarthroplasty on May 26, 2010.  HOSPITAL COURSE:  [name] underwent right shoulder revision hemiarthroplasty on May 26, 2010, and was admitted to the floor postoperatively.  He recovered well on the floor postoperatively.  Pain control was an issue.  He was initially controlled well with IV pain medication and transitioned to oral medications.  Pain did improve significantly within the first few days.  He worked with occupational therapist on gentle exercises with a goal of 40 degrees external rotation, 140 degrees forward flexion, but had significantly too much pain to really make much progress during his hospital stay.  His drain was discontinued on postoperative day #1.  His dressing was changed on postoperative day #2.  His incision was clean, dry and intact.  He was kept an additional day to get his pain under control, but on postoperative day #3, his pain was under good control with oral medications and was discharged home with his wife.  He remained stable with stable vital signs throughout his hospital course.  He had SCDs for DVT prophylaxis.  DISCHARGE INSTRUCTIONS:  He will work on gentle exercises as taught by the occupational therapist, and otherwise, will stay in his sling.  He will follow up in 10-14 days for wound check.  He will call or return sooner with any problems or concerns.  He will be discharged home with narcotic pain medicines.     Jones Broom, MD     JC/MEDQ  D:   07/05/2010  T:  07/06/2010  Job:  [id number]  Electronically Signed by Jones Broom  on 07/13/2010 09:04:44 AM

## 2010-07-30 LAB — CBC
HCT: 47.5 % (ref 39.0–52.0)
Hemoglobin: 16.5 g/dL (ref 13.0–17.0)
MCHC: 34.9 g/dL (ref 30.0–36.0)
MCV: 92.2 fL (ref 78.0–100.0)
Platelets: 171 10*3/uL (ref 150–400)
RBC: 5.15 MIL/uL (ref 4.22–5.81)
RDW: 14.1 % (ref 11.5–15.5)
WBC: 8.3 10*3/uL (ref 4.0–10.5)

## 2010-07-30 LAB — COMPREHENSIVE METABOLIC PANEL
ALT: 25 U/L (ref 0–53)
AST: 29 U/L (ref 0–37)
Albumin: 3.9 g/dL (ref 3.5–5.2)
Alkaline Phosphatase: 45 U/L (ref 39–117)
BUN: 18 mg/dL (ref 6–23)
CO2: 26 mEq/L (ref 19–32)
Calcium: 9.3 mg/dL (ref 8.4–10.5)
Chloride: 105 mEq/L (ref 96–112)
Creatinine, Ser: 1.28 mg/dL (ref 0.4–1.5)
GFR calc Af Amer: >60 mL/min (ref >60)
GFR calc non Af Amer: >60 mL/min (ref >60)
Glucose, Bld: 100 mg/dL — ABNORMAL HIGH (ref 70–99)
Potassium: 4.2 mEq/L (ref 3.5–5.1)
Sodium: 140 mEq/L (ref 135–145)
Total Bilirubin: 0.9 mg/dL (ref 0.3–1.2)
Total Protein: 7 g/dL (ref 6.0–8.3)

## 2010-07-30 LAB — DIFFERENTIAL
Basophils Absolute: 0.1 10*3/uL (ref 0.0–0.1)
Basophils Relative: 1 % (ref 0–1)
Eosinophils Absolute: 0.2 10*3/uL (ref 0.0–0.7)
Eosinophils Relative: 3 % (ref 0–5)
Lymphocytes Relative: 33 % (ref 12–46)
Lymphs Abs: 2.7 10*3/uL (ref 0.7–4.0)
Monocytes Absolute: 0.8 10*3/uL (ref 0.1–1.0)
Monocytes Relative: 9 % (ref 3–12)
Neutro Abs: 4.5 10*3/uL (ref 1.7–7.7)
Neutrophils Relative %: 54 % (ref 43–77)

## 2010-07-30 LAB — CARDIAC PANEL(CRET KIN+CKTOT+MB+TROPI)
CK, MB: 2.7 ng/mL (ref 0.3–4.0)
CK, MB: 3.1 ng/mL (ref 0.3–4.0)
CK, MB: 3.5 ng/mL (ref 0.3–4.0)
Relative Index: 0.6 (ref 0.0–2.5)
Relative Index: 0.7 (ref 0.0–2.5)
Relative Index: 0.7 (ref 0.0–2.5)
Total CK: 421 U/L — ABNORMAL HIGH (ref 7–232)
Total CK: 421 U/L — ABNORMAL HIGH (ref 7–232)
Total CK: 526 U/L — ABNORMAL HIGH (ref 7–232)
Troponin I: 0.01 ng/mL (ref 0.00–0.06)
Troponin I: 0.02 ng/mL (ref 0.00–0.06)
Troponin I: 0.02 ng/mL (ref 0.00–0.06)

## 2010-07-30 LAB — LIPID PANEL
Cholesterol: 317 mg/dL — ABNORMAL HIGH (ref 0–200)
HDL: 54 mg/dL (ref >39)
LDL Cholesterol: 196 mg/dL — ABNORMAL HIGH (ref 0–99)
Total CHOL/HDL Ratio: 5.9 RATIO
Triglycerides: 336 mg/dL — ABNORMAL HIGH (ref <150)
VLDL: 67 mg/dL — ABNORMAL HIGH (ref 0–40)

## 2010-07-30 LAB — HEMOGLOBIN A1C
Hgb A1c MFr Bld: 5.4 % (ref 4.6–6.1)
Mean Plasma Glucose: 108 mg/dL

## 2010-07-30 LAB — PROTIME-INR
INR: 1 (ref 0.00–1.49)
Prothrombin Time: 13.5 seconds (ref 11.6–15.2)

## 2010-09-01 ENCOUNTER — Other Ambulatory Visit: Payer: Self-pay | Admitting: Internal Medicine

## 2010-09-06 NOTE — Consult Note (Signed)
[name], [name]            ACCOUNT NO.:  [id number]   MEDICAL RECORD NO.:  [id number]          PATIENT TYPE:  INP   LOCATION:  2035                         FACILITY:  [facility]   PHYSICIAN:  Noralyn Pick. Eden Emms, MD, FACCDATE OF BIRTH:  December 24, 1960   DATE OF CONSULTATION:  12/07/2008  DATE OF DISCHARGE:                                 CONSULTATION   HISTORY OF PRESENT ILLNESS:  [name] is a 50 year old patient  admitted from Dr. Debby Bud' office for chest pain.  The patient has no  previous history of cardiac disease.  He does have hyperlipidemia,  hypertension, and a positive family history.   He had pains in his chest and epigastric area for a long time which he  associated with reflux, however, over the last 2 weeks, he has had more  classically exertional chest pain.  It has been somewhat of a crescendo  pattern over the last 2 weeks.  He was seen in Dr. Debby Bud' office today  and because of concerns of unstable angina was hospitalized.  He is  currently pain-free.   Lab work is pending.  I reviewed his office EKG and his EKG just done  here at [facility] and it shows no acute changes with normal sinus rhythm,  left atrial enlargement.   The patient has been compliant with his medications.  His pain as  described is a squeezing sensation in his chest.  It can be associated  with diaphoresis and dyspnea.  It is 90% of the time exertional over the  last 2 weeks, although he has had occasional pains at rest and with  recumbency which were more similar to his reflux.  He has chronic right  shoulder pain and there is no obvious radiation of his pain to the back  or to the left arm.   REVIEW OF SYSTEMS:  His 10-point review of systems is otherwise  negative.   MEDICATIONS PRIOR TO ADMISSION:  1. Zyrtec.  2. Protonix 40 a day.  3. Tylenol.  4. Prevacid 30 mg a day p.r.n.  5. Viagra.  6. Simvastatin 40 a day.  7. Tussionex p.r.n.   ALLERGIES:  He denies any allergies outside of  TEQUIN.   PAST MEDICAL HISTORY:  1. GERD.  2. Hyperlipidemia.  3. Hypertension.  4. Cluster headaches, worked up at the [employer].  5. Trigeminal neuralgia.  6. History of small-bowel obstruction requiring surgery.  7. Previous partial right shoulder replacement and arthroscopic right      knee surgery.  8. He has also had a previous appendectomy.   FAMILY HISTORY:  He has premature coronary artery disease on his  mother's side.  Father also had coronary disease and congestive failure.   SOCIAL HISTORY:  The patient is happily married.  He and his wife are  from [place].  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  He enjoys golf.  He currently does not smoke or drink  excessively.  He has average activity levels when not having chest pain.   PHYSICAL EXAMINATION:  GENERAL:  A healthy-appearing black male in  no  distress.  VITAL SIGNS:  Blood pressure is 140/80, pulse is 77 and regular,  respiratory rate 14 and afebrile.  HEENT:  Unremarkable.  NECK:  Carotids are without bruit.  No lymphadenopathy, thyromegaly, or  JVP elevation.  LUNGS:  Clear.  Good diaphragmatic motion.  No wheezing.  HEART:  S1 with a split second heart sound.  No obvious murmur.  PMI  normal.  ABDOMEN:  Benign with previous appendectomy and midline umbilical scar.  No AAA.  No tenderness.  No hepatosplenomegaly or hepatojugular reflux.  Distal pulses are intact with no edema.  NEUROLOGIC:  Nonfocal.  SKIN:  Warm and dry.  No muscular weakness.  Previous right knee  arthroscopic surgery.  Previous right shoulder surgery.   LABORATORY DATA:  Lab work is pending.  Chest x-ray is pending.  EKG  shows sinus rhythm, left atrial enlargement, borderline ECG.  I reviewed  his telemetry over the last hour of his hospital admission and he has  sinus rhythm with no ectopy.   IMPRESSION:  1. New-onset chest pain which is somewhat classically exertional in a      patient with  positive family history of hypertension,      hyperlipidemia and favor diagnostic heart catheterization.  Risks      including stroke were discussed with the patient and he is willing      to proceed in the morning.  We will load with 300 of Plavix tonight      and in the morning start heparin only if recurrent chest pain.      Continue aspirin.  2. Hypercholesteremia.  Continue simvastatin, lipid, and liver profile      in the morning.  3. Hypertension.  Currently well controlled.  Consider adding beta-      blocker if coronary disease is found   Further recommendations will be based on the results of his heart  catheterization.      Noralyn Pick. Eden Emms, MD, [facility]  Electronically Signed     PCN/MEDQ  D:  12/07/2008  T:  12/08/2008  Job:  [id number]

## 2010-09-06 NOTE — Cardiovascular Report (Signed)
NAMERANFERI, CLINGAN            ACCOUNT NO.:  [id number]   MEDICAL RECORD NO.:  [id number]          PATIENT TYPE:  OBV   LOCATION:  2035                         FACILITY:  [facility]   PHYSICIAN:  Rollene Rotunda, MD, FACCDATE OF BIRTH:  December 29, 1960   DATE OF PROCEDURE:  12/08/2008  DATE OF DISCHARGE:  12/08/2008                            CARDIAC CATHETERIZATION   PROCEDURE:  Left heart catheterization/coronary arteriography.   INDICATION:  Evaluate patient with chest pain suggestive of unstable  angina.   PROCEDURE NOTE:  Left heart catheterization performed via the right  femoral artery.  The artery was cannulated using anterior wall puncture.  A #4-French arterial sheath was inserted via the modified Seldinger  technique.  Preformed Judkins and a pigtail catheter were utilized.  The  patient tolerated the procedure well and left the lab in stable  condition.   RESULTS:  Hemodynamics LV 147/7, AO 143/90.  Coronaries:  Left main was  short.  The LAD wrapped the apex.  There were luminal irregularities.  First and second diagonal were small and normal.  Circumflex in the AV  groove was small with diffuse luminal irregularities.  There was a mid  obtuse marginal which was large and branching with diffuse luminal  irregularities.  Right coronary artery was dominant though moderate in  size.  There was proximal 25% stenosis and mid 30% stenosis.  The PDA  was small with luminal irregularities.  Left ventriculogram; the left  ventriculogram was obtained in the RAO projection.  The EF was 60% with  normal wall motion.   CONCLUSION:  Nonobstructive coronary artery disease and normal left  ventricular function.   PLAN:  The patient will have primary risk reduction.      Rollene Rotunda, MD, [facility]  Electronically Signed     JH/MEDQ  D:  01/21/2009  T:  01/21/2009  Job:  [id number]   cc:   Rosalyn Gess. Norins, MD

## 2010-09-06 NOTE — Letter (Signed)
January 22, 2007    Aundra Dubin, M.D.  [address]  [place], [place] [address]   RE:  [name], [name]  MRN:  [id number]  /  DOB:  [birth year]   Dear Will,   Thank you very much for seeing [name], a very pleasant, 50-  year-old, African-American gentleman with diffuse myalgias with initial  laboratory revealing elevated creatinine kinase and CRP.   The patient was seen January 10, 2007 for back pain. His examination  revealed him to be very tender in the area of the pectoralis major  muscles, in the center of his back as well as his T11-L1 tender with  flexion. He had no spinous process tenderness. Laboratories were ordered  at that time coming back positive with CK of 478 and CRP was 6.0 and ANA  was negative. The patient was initially given low dose prednisone at 10  mg daily. After several days when contacting the patient he reports his  pain was unabated. At that point given his elevated creatinine kinase  and CRP, I increased him to 40 mg of prednisone daily with instructions  to taper on a 3-day schedule. When the patient got to 30 mg daily, he  started having recurrent pain and discomfort and currently is taking 40  mg of prednisone daily. He has been able to return to work and has no  significant limitations in his work except for just comfort.   PAST MEDICAL HISTORY:   MEDICAL:  History of GERD, history of hypertension with related  headaches, history of cluster headaches for which he took steroids for a  long period of time, hyperlipidemia, history of avascular necrosis of  the right shoulder.   SURGICAL HISTORY:  Appendectomy, laparotomy for a small-bowel  obstruction, Gamma-knife surgery for trigeminal neuralgia, right knee  arthroscopy and most recently reconstruction of his right shoulder  secondary to avascular necrosis.   CURRENT MEDICATIONS:  1. Zyrtec D.  2. Lipitor 20 mg daily.  3. Protonix 40 mg q.a.m.  4. Prednisone 40 mg  daily.  5. Percocet as needed for pain.   Following our conversation today, I have ordered a uric acid level, LDH,  CMET, CBC and sedimentation rate for the patient so that these labs will  be available when you see him.   [name] is a very pleasant, hard working gentleman now having  significant muscle pain. I appreciate your assistance in diagnosing his  condition and helping me develop a better treatment plan for him.   As always, I appreciate your assistance in the care of my patients and  look forward to hearing from you.    Sincerely,      Rosalyn Gess. Norins, MD  Electronically Signed    MEN/MedQ  DD: 01/23/2007  DT: 01/23/2007  Job #: [id number]

## 2010-09-06 NOTE — Discharge Summary (Signed)
[name], [name]            ACCOUNT NO.:  [id number]   MEDICAL RECORD NO.:  [id number]          PATIENT TYPE:  OBV   LOCATION:  2035                         FACILITY:  [facility]   PHYSICIAN:  Rosalyn Gess. Norins, MD  DATE OF BIRTH:  [birth year]   DATE OF ADMISSION:  12/07/2008  DATE OF DISCHARGE:  12/08/2008                               DISCHARGE SUMMARY   ADMITTING DIAGNOSES:  1. Unstable angina.  2. History of trigeminal neuralgia.  3. Hypertension.  4. Hyperlipidemia.   DISCHARGE DIAGNOSES:  1. Nonobstructive coronary artery disease with chest wall pain.  2. History of trigeminal neuralgia.  3. Hypertension.  4. Hyperlipidemia.   CONSULTANTS:  1. Noralyn Pick. Eden Emms, MD, [facility]  2. Rollene Rotunda, MD, [facility].   PROCEDURES:  1. Two-view chest x-ray at admission, which showed no acute      cardiopulmonary process.  2. Diagnostic cardiac catheterization, which revealed short left main      LAD that wrapped the apex, small D1-D2 vessels.  Circ artery was in      the AV groove with diffuse luminal irregularities.  The OM1 with      large branching diffuse luminal irregularities, RCA which was      dominant vessel, moderate sized, proximal 25%, mid 30% lesion, PDA      was small with luminal irregularities.  The LVEF was 60% with      normal wall motion with conclusion of the operator being      nonobstructive coronary artery disease.   HISTORY OF PRESENT ILLNESS:  The patient presented to the office for  complaint of being lightheaded, which was relieved by something sweet.  He also reports he had chest pain that radiate through to his back,  described as tightness accompanied by shortness of breath and  diaphoresis.  He reports decreased exercise tolerance, having to stop  when mowing his yard, also having reproducible pain with exercise.  Cardiac risk factors including male gender positive family history of  the maternal side, history of hyperlipidemia, history of  mild  hypertension, history of mild overweight.  The patient was being treated  for reflux with Protonix.  Because of his history of exertional chest  pain, he was admitted.  Please see the EMR generated H and P for past  medical history, family history, social history, and admission physical  exam.   HOSPITAL COURSE:  1. The patient was admitted to the telemetry unit.  Cardiac enzymes      were ordered and cycled x3 with first CKs at 526 with MB fraction      of 3.5 with troponin of 0.01; second with a CK of 421 with MB      fraction of 3.1, troponin I 0.02; third with a CK of 421, MB      fraction of 2.7, troponin 0.02.  Lipid panel revealed cholesterol      317, triglycerides 336, HDL was 54, LDL was 196.  The patient continued to have some chest wall discomfort on the left  side.  The patient was taken to the cardiac cath lab on the  morning of December 08, 2008 with results as noted.  With the patient having ruled out for obstructive coronary disease with  him having elevated CK, but normal MB fraction and troponin Is, he was  thought he had noncardiac chest pain and at this point, he was ready for  discharge home.  1. Chest wall pain/CK elevation.  At this point, the patient did have      some mild chest wall tenderness to palpation.  We now focus on him      having chest wall inflammation, irritation is a source of his      discomfort.  Plan, the patient advised to take nonsteroidal anti-      inflammatory medications such as Aleve 220 mg two tablets a.m. and      p.m.  2. Hypertension.  The patient's blood pressure during his hospital      stay was a normal range.  Day of discharge, 126/91 with a range of      125-156 systolic, 81-97 diastolic.  Plan, the patient will be      started on ACE inhibitor, lisinopril 10 mg p.o. daily for better      blood pressure control with follow up on January 17, 2009.  3. Hyperlipidemia.  The patient admits that he had not been taking his       medication.  Previously, he had a good response with an LDL      dropping to 139.  He is now known to have mild problem with      atherosclerosis.  Plan, resume simvastatin 40 mg daily and a low-      fat diet.  Follow up laboratory in approximately 4 weeks.  4. GI.  The patient will continue on Protonix 40 mg q.a.m.   DISCHARGE EXAMINATION:  VITAL SIGNS:  Temperature 97.5, blood pressure  128/61, heart rate 63, respirations 18.  GENERAL APPEARANCE:  This a well-nourished gentleman in no acute  distress.  CHEST:  The patient is moving air well with no increased work of  breathing.  He did have tenderness to palpation in the intercostal  spaces from the left anterior axillary line to under the left breast.  CARDIOVASCULAR:  2+ radial pulses.  Precordium was quiet.  He had a  regular rate and rhythm.  ABDOMEN:  Soft.   FINAL LABORATORY:  Cardiac enzymes as noted.  The patient had a  hemoglobin A1c that was normal at 5.4%.  Comprehensive metabolic panel  was unremarkable with a BUN of 1.28, creatinine of 18.  Electrolytes  were normal.  CBC at admission was normal with hemoglobin of 16.5 g,  white count was 8300.   DISCHARGE MEDICATIONS:  The patient will continue Zyrtec-D Allergy, take  Protonix 40 mg q.a.m., Tylenol Sinus as needed, Viagra 100 mg as needed,  simvastatin 40 mg daily.  We will add the naproxen sodium (Aleve) two  tablets in a.m. and p.m.   DISPOSITION:  The patient is discharged home.  He is to report to his  office visit on January 17, 2009 already scheduled.  He will come to  lab 2 days prior for followup lipid panel.   The patient's condition at time of discharge dictation is medically  stable and improved.      [name], MD  Electronically Signed     MEN/MEDQ  D:  12/08/2008  T:  12/09/2008  Job:  [id number]   cc:   Noralyn Pick. Eden Emms, MD, [facility]

## 2010-09-09 NOTE — Op Note (Signed)
[place]  Patient:    [name], [name]                     MRN: [id number] Proc. Date: 06/26/00 Adm. Date:  [id number] Disc. 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  OPERATIVE PROCEDURE:  Arthroscopy followed by open lateral release.  DESCRIPTION OF PROCEDURE:  The patient elected for general anesthetic and this was given. The right lower extremity was prepped and draped in the usual manner for _____ plus a proximal pneumatic tourniquet was put in place, if needed. Anteromedial and anterolateral portals were established. The knee was examined and the significant findings were fibrillated articular surface lateral facet of the patella, some chondromalacic changes of the medial facet, some of the femoral trochlea. I did not see any loose bodies or other structures that would account for his lateral pain.  I had decided to do an open lateral release, as discussed preoperatively. Therefore, I made about a 2-inch lateral incision, put small retractors in place and opened the lateral retinaculum from near the superior border down to the near distal lateral border of the patella. I then opened the synovium. Defibrillated articular cartilage was sharply excised from the patella. There was a similar smaller change in the femoral condyle, which was removed. I felt that the patella was looser. There were a few bleeders and these were Bovie cauterized. The joint was copiously irrigated again. I closed the superficial fascia, approximated the subcutaneous tissues with 2-0 Vicryl and approximated the skin edges for all the puncture wounds with 4-0 nylon. Xeroform covered each wound and a bulky dressing was applied.  INSTRUCTIONS TO THE  PATIENT:  Frequent quad sets and ankle pumps. Be at general house rest. Usual diet. Keep the dressing dry. Walk with straight legs for three or four days and then gradually mobilize as symptoms allow. Call sooner with concerns, otherwise see me near the first of the week. DD:  06/26/00 TD:  06/26/00 Job: [id number]

## 2010-09-09 NOTE — Op Note (Signed)
NAME:  [name], [name] NO.:  [id number]   MEDICAL RECORD NO.:  [id number]          PATIENT TYPE:  INP   LOCATION:  5016                         FACILITY:  [facility]   PHYSICIAN:  Dyke Brackett, M.D.    DATE OF BIRTH:  11/21/1960   DATE OF PROCEDURE:  DATE OF DISCHARGE:                               OPERATIVE REPORT   PREOPERATIVE DIAGNOSIS:  Avascular necrosis right shoulder.   POSTOPERATIVE DIAGNOSIS:  Avascular necrosis right shoulder.   OPERATION:  Right shoulder hemiarthroplasty (DePuy Global Press-Fit  shoulder with 15 x 48 mm head and size 10 stem).   SURGEON:  Dyke Brackett, M.D.   ASSISTANT:  Claude Manges. Cleophas Dunker, M.D.; Legrand Pitts. Duffy, P.A.   ESTIMATED BLOOD LOSS:  Minimal.   DESCRIPTION OF PROCEDURE:  The patient had a deltopectoral incision  made, incision just beginning lateral at the coracoid process, extending  in line just medial to the deltoid fibers.  The interval was identified  and the cephalic vein retracted laterally.  The clavipectoral fascia was  entered.  The conjoin tendon was retracted medially.  There was no  significant contracture of the shoulder, secondary that would an AVN.  The shoulder mainly, there was tagging of the subscapularis with release  of the subscapularis off the lesser tuberosity, followed by cutting of  the humeral head with about 25 to 30 degrees of retroversion after  reaming the canal out to a size 10 stem.  The intermedullary guide,  DePuy system was used for a cutting guide.   This was progressively rasped up to accept a size 10 stem.  Back from  the back table, the head's thickness was sized to be 15 x I believe 48.  This was placed on the trial and the broach.  Excellent restoration and  soft tissue envelope was obtained with this.  There was a tendency to be  able to sublux posteriorly, approximately 50%.  The version was deemed  to be excellent.  The cut, varus/valgus angles deemed to be excellent as  well.   The trial was removed, followed by irrigation.  Final component  was inserted with the same-size head.  Closure was effected with #1  Ethibond on the subscapularis and subcutaneous tissues with 2-0 Vicryl  and the skin with a stapling device.  The patient also had a  preoperative block and a sling was applied.  Taken to the recovery room  in stable condition.      Dyke Brackett, M.D.  Electronically Signed     WDC/MEDQ  D:  05/11/2006  T:  05/12/2006  Job:  [id number]

## 2010-09-09 NOTE — H&P (Signed)
NAME:  [name], [name]            ACCOUNT NO.:  [id number]   MEDICAL RECORD NO.:  [id number]          PATIENT TYPE:  INP   LOCATION:  NA                           FACILITY:  [facility]   PHYSICIAN:  Dyke Brackett, M.D.    DATE OF BIRTH:  10-23-1960   DATE OF ADMISSION:  DATE OF DISCHARGE:                              HISTORY & PHYSICAL   CHIEF COMPLAINT:  Right shoulder pain.   HPI:  [name] is a 50 year old African American male with right  shoulder pain for the past 3-4 years.  No injury.  History of prednisone  he used for trigeminal neuralgia.  Pain in shoulder is described as a  constant sharp pain, especially with movements; otherwise, a dull, achy  pain at rest.  Does have waking pain.  He has failed Cortizone  injections into the right shoulder.  MRI of the right shoulder ADN of  posterior articular surface of the humeral head, infraspinatus tendon  with a partial thickness under surface tearing and degenerative changes  of the AC joint.   ALLERGIES:  ASPIRIN CAUSES GI UPSET.   FOOD ALLERGIES:  PINEAPPLE IN THE PAST HAS CAUSED CLUSTER HEADACHES;  HOWEVER, SINCE UNDERGOING SURGERY FOR THE TRIGEMINAL NERVE, HE NO LONGER  HAS PROBLEMS WITH CLUSTER HEADACHES.   MEDICATIONS:  1. Ambien 10 mg 1 p.o. q.h.s. p.r.n. insomnia.  2. Lipitor 20 mg 1 daily q.p.m.  3. Oxycodone 5/325 one tab q.4 hours.  4. Prevacid 30 mg 1 p.o. b.i.d.  5. Zyrtec-D 5/120 mg 1 tablet twice daily.   PAST MEDICAL HISTORY:  1. History of hypertension, resolved after trigeminal surgery.  2. History of cluster headaches, resolved with trigeminal surgery.  3. Hyperlipidemia.  4. Year round allergies.  5. Gastroesophageal reflux disease.  6. History of trigeminal neuralgias, status post surgery.   SOCIAL HISTORY:  Patient smokes approximately 2-4 cigarettes per day, he  has done so for 11 years.  He drinks socially.  He is married, lives in  a 2-story home with one step the usual entrance.  Primary care  physician  is Dr. Illene Regulus.   FAMILY HISTORY:  The patient's mother is deceased at age 54, history of  MI, hypertension, diabetes and stroke.  Father alive at age 4, reported  to be healthy.  No siblings.   REVIEW OF SYSTEMS:  Reveals patient wears glasses for driving.  Has a  remote history of bronchitis greater than 5 years ago.  Denies any chest  pain, diabetes.  History of hypertension, resolved with trigeminal nerve  surgery.  History of sleep apnea, also resolved since undergoing the  trigeminal nerve surgery.  Headaches resolved since trigeminal  neuralgia.  History of kidneys stones 7-8 years ago.  Review of systems,  otherwise, negative or noncontributory.   EXAMINATION:  GENERAL:  The patient is a well-developed, well-nourished  African American male in no acute distress.  Patient's mood and affect  are appropriate.  VITAL SIGNS:  Patient's height is 5 feet 10-1/2 inches.  Weight 234  pounds.  Temp is 97.7.  Pulse 70.  Respiratory rate 16.  Blood pressure  120/78.  HEENT:  Head is normocephalic, atraumatic without maxillary or sinus  tenderness to palpation.  Conjunctivae is pink.  Sclerae is nonicteric.  EOMs are intact.  No visible external ear deformities.  TMs pearly and  gray bilaterally.  Nose and nasal septum:  Nasal mucosa pink and moist.  Buccal mucosa pink and moist.  He has good dentition.  Pharynx without  erythema or exudate.  Tongue and uvula midline.  CARDIAC:  Regular rate and rhythm.  No murmurs, rubs or gallops are  noted.  CHEST:  Lungs are clear to auscultation bilaterally.  No wheezing,  rhonchi or rales noted.  ABDOMEN:  Soft, nontender.  Bowel sounds x4 quadrants.  No hepatomegaly.  No splenomegaly noted.  GENITOURINARY:  Rectal exam is deferred at this time.  NECK:  Patient has full range of motion of cervical spine without pain.  No tenderness to palpation of the cervical spine.  Carotids are 2+  without bruits.  BACK:  No tenderness to  palpation over the thoracic lumbar spine.  NEUROLOGIC:  Patient is alert and oriented x3.  Cranial nerves II-XII  are grossly intact.  He does have decreased sensation of the right side  of his face due to trigeminal surgery.  Deep tendon reflexes upper  extremities, biceps, triceps and brachioradialis equal and symmetric  bilaterally.  Upper extremities:  The upper extremities are equal and  symmetric in size and shape.  Left shoulder full forward flexion to 180  degrees.  Abduction full forward flexion 180 degrees both active.  He is  able to internally rotate and reach up in the upper thoracic spine.  Right shoulder forward flexion 90 degrees actively.  Abduction 90  degrees actively and passively bringing him to 140 degrees of forward  flexion.  Internal rotation, he can reach into the lower lumbar spine.  He has tenderness over the greater tuberosity of AC joint.  No pain with  adduction.  Internal rotation against resistance reveals 4/5 strength  against resistance; otherwise, has 5/5 strength throughout the upper  extremities.  He has pain with internal rotation against resistance.  Radial pulses are 2+ and he has good sensation in the fingertips  throughout.   IMPRESSION:  1. Right shoulder AVM per MRI with infraspinatus tendon with partial      thickness tear under surface and degenerative changes of the AC      joint.  2. Gastroesophageal reflux disease.  3. Hyperlipidemia.  4. Year round allergies.  5. History of trigeminal neuralgia, status post surgery, which      resulted in decrease sensation in the right side of his face and      with resolved hypertension, cluster headaches and sleep apnea with      trigeminal surgery.   PLAN:  Patient is to be admitted to [facility] on January 18,  to undergo a right shoulder hemiarthroplasty by Dr. Madelon Lips.  Prior to  surgery, patient is to undergo all preps of labs and testing.  He did receive clearance from his primary  care physician, Dr. Illene Regulus of  [facility].  [facility] is to be consulted if patient  has any medical issues while in the hospital.      Richardean Canal, Arnetha Courser, M.D.  Electronically Signed    GC/MEDQ  D:  05/09/2006  T:  05/10/2006  Job:  [id number]

## 2010-09-09 NOTE — H&P (Signed)
[name], [name] NO.:  [id number]   MEDICAL RECORD NO.:  [id number]          PATIENT TYPE:  INP   LOCATION:  0102                         [facility]   PHYSICIAN:  Gordy Savers, MDDATE OF BIRTH:  07-23-1960   DATE OF ADMISSION:  12/09/2005  DATE OF DISCHARGE:                                HISTORY & PHYSICAL   CHIEF COMPLAINT:  Intractable nausea and vomiting.   HISTORY OF PRESENT ILLNESS:  The patient is a 50 year old black gentleman  who was well until 3 days ago.  At that time he had the onset of nausea,  vomiting and crampy abdominal pain which has been intermittent.  This has  been associated with diarrhea.  For the past 24 hours symptoms have worsened  and he has been intolerant of any p.o. intake.  He was seen as an outpatient  earlier today and sent to the emergency department for evaluation.  Evaluation has included CBC, chemistries and a urinalysis as well as  obstructive series.  They were unremarkable except for some ketonuria.  There is no evidence of obstruction.  Throughout the day the patient has  been treated with IV fluids and antiemetics but has failed to improve.  He  is now admitted for further evaluation and treatment of suspected acute  viral gastroenteritis.   PAST MEDICAL HISTORY:  The patient had an appendectomy at age 66,  postoperatively the patient required surgery for a small bowel obstruction.  He has a history of gastroesophageal reflux disease.  He was evaluated by GI  approximately 6 or 8 months ago for abdominal pain thought secondary to  either pancreatitis or severe reflux.  He did not require hospital admission  at that time.  He has seasonal allergic rhinitis and a history of ongoing  tobacco use.   MEDICAL REGIMEN INCLUDES:  Prevacid and Zyrtec-D.   FAMILY HISTORY:  Father age 23 is in good health.  Mother died of  complications of cardiac and cerebrovascular disease with a history of  diabetes.   Multiple maternal family members have diabetes.  Three siblings  are in good health.   EXAMINATION:  GENERAL:  Exam revealed a well-developed African-American male  who appeared quite weak but no acute distress.  VITAL SIGNS:  Vital signs were stable.  He is afebrile.  He had no resting  tachycardia.  HEAD AND NECK:  Normal sclera, anicteric.  Ear, nose and throat  unremarkable.  Mucosal membranes were perhaps slightly dry.  Neck was  supple.  No adenopathy.  CHEST:  Chest was clear.  CARDIOVASCULAR:  Exam revealed normal heart sounds.  No tachycardia.  ABDOMEN:  Revealed some mild left lower quadrant tenderness.  No guarding,  rebound or distension.  Bowel sounds were faint.  EXTREMITIES:  Negative.  Full peripheral pulses.   IMPRESSION:  Suspect viral gastroenteritis.   DISPOSITION:  The patient will be admitted to hospital, he will be  maintained on IV fluids and kept n.p.o. except for sips of water and ice  chips, he will be treated with antiemetics and analgesics and followed  closely clinically.  ______________________________  Gordy Savers, MD     PFK/MEDQ  D:  12/09/2005  T:  12/09/2005  Job:  [id number]

## 2010-09-09 NOTE — Letter (Signed)
April 02, 2006    [name], M.D.  [address] [place], [place] [address]   RE:  [name], [name]  MRN:  [id number]  /  DOB:  05-22-1960   Dear Dr. Madelon [name],   I have been asked by Dr. Madelon [name] to provide surgical clearance for [name] who is going to undergo subtotal shoulder replacement surgery  for avascular necrosis of the humeral head.   HISTORY OF PRESENT ILLNESS:  The patient presented to my office back in  November on the 12th for significant right shoulder pain.  At that time  he was given a steroid injection.  There were very little results with  this, and he continued to have ongoing pain and discomfort.  Because of  his persistent pain and discomfort, he was referred to Dr. Madelon [name].  Evaluation, including MRI, did reveal that there may be a partial tear  of the rotator cuff, but more importantly, there was avascular necrosis  of the humeral head.  This is most likely related to high-dose steroids  that the patient took for migraine cluster headache.  He is now to be  scheduled for surgical replacement, as noted.   PAST SURGICAL HISTORY:  1. Appendectomy, remote.  2. Laparotomy for SBO, remote.  3. Gamma knife surgery for trigeminal neuralgia in the last 10 years.  4. Right knee arthroscopy.   PAST MEDICAL HISTORY:  1. The patient had the usual childhood diseases.  2. History of GERD.  3. He did have hypertension that was related to the migraine cluster      headache.  4. History of cluster headache, as noted.  5. History of hyperlipidemia.   CURRENT MEDICATIONS:  1. Prevacid 30 mg b.i.d.  2. Zyrtec-D b.i.d.  3. Lipitor 20 mg daily.   FAMILY HISTORY:  Noncontributory, with no significant inheritable  disease, with his father still being in good health, being a very  stubborn gentleman, living up in [place].   SOCIAL HISTORY:  The patient works as an Retail buyer for [employer].  He is married and has a supportive spouse.   REVIEW OF SYSTEMS:  The patient has had no fevers, sweats, or chills or  other constitutional symptoms.  No cardiovascular complaints or  respiratory complaints.  GI:  Well-controlled.   LIMITED PHYSICAL EXAMINATION:  VITAL SIGNS:  Temperature 96.6, blood  pressure 138/88, pulse 82.  GENERAL:  This is a heavy-set African American gentleman in no acute  distress.  HEENT:  Grossly normal.  CHEST:  Clear.  CARDIOVASCULAR:  2+ radial pulse.  He had quiet precordium with a  regular rate and rhythm without murmurs, rubs, or gallops.  ABDOMEN:  Nontender.  RECTAL/GU:  Deferred to the patient's previous physical exams where  these were normal.  EXTREMITIES:  The patient has no obvious deformity, but I did not do a  full exam since he has had recent full evaluation by both myself and  orthopedics, with a limited range of motion in his right shoulder.   LABORATORY DATA:  The patient recently had a hepatitis panel because of  minimally elevated liver functions which was negative for hepatitis A,  C, and D.  The patient's last BUN and creatinine were from December 14, 2005, with a creatinine of 1.4 and BUN of 11.  The patient last had a  full panel on June 22, 2005 where he had a hemoglobin of 15.4 g, white  count 9200, with a normal differential.  Chemistries were unremarkable,  except for a creatinine of 2.3 which is thought to be due to GI  distress.  Liver functions at that time were normal.  The patient had a  normal amylase, normal lipase.   12-lead electrocardiogram performed today revealed the patient to have a  normal sinus rhythm with no evidence of acute or old injury.   RADIOGRAPHIC STUDIES:  C-spine series on March 05, 2006 was negative  for any problems in that regard.  The patient did have a CT scan of the  abdomen and pelvis performed June 17, 2005 which was unremarkable,  except for a benign-appearing left renal cyst.   This is a patient who is medically stable.  He  has no contraindications  to surgery or any risk factors that would prohibit surgery or  anesthesia.   I would be happy to follow the patient medically during his hospital  stay if needed.    Sincerely,      Rosalyn Gess. Norins, MD  Electronically Signed    MEN/MedQ  DD: 04/02/2006  DT: 04/03/2006  Job #: [id number]

## 2010-09-09 NOTE — Discharge Summary (Signed)
NAMEJONHATAN, HEARTY            ACCOUNT NO.:  [id number]   MEDICAL RECORD NO.:  [id number]          PATIENT TYPE:  INP   LOCATION:  5016                         FACILITY:  [facility]   PHYSICIAN:  Dyke Brackett, M.D.    DATE OF BIRTH:  1960-08-28   DATE OF ADMISSION:  05/11/2006  DATE OF DISCHARGE:  05/14/2006                               DISCHARGE SUMMARY   ADMITTING DIAGNOSES:  1. Right shoulder with avascular necrosis per MRI with infraspinatus      tendon partial thickness tear under surface, degenerative changes      in AC joint.  2. Gastroesophageal reflux disease.  3. Hyperlipidemia.  4. YEAR-ROUND ALLERGIES.  5. He has a history of trigeminal neuralgia status post surgery which      resulted in decreased sensation of right side of the face with      resolution of hypertension, clustered headaches and sleep apnea      status post trigeminal surgery.   DISCHARGE DIAGNOSES:  1. Status post right shoulder hemiarthroplasty.  2. Poor urinary output, resolved.  3. Poor pain control, under control at the time of discharge.  4. Gastroesophageal reflux disease.  5. History of hyperlipidemia.  6. YEAR-ROUND ALLERGIES.  7. History of trigeminal neuralgia status post surgical intervention.   HISTORY OF PRESENT ILLNESS:  Mr. Ruggiero is a 50 year old African-  American male with right shoulder pain for the past three to four years.  No known injury.  History of prednisone use for trigeminal neuralgia.  Pain in shoulder is described as constant and sharp, especially with  movement, otherwise is a dull achy pain at rest.  Does have waking pain.  He has failed cortisone injections right shoulder.  MRI of the right  shoulder showed avascular necrosis posterior articular surface of the  humeral head, infraspinatus tendon with partial thickness under surface,  tearing and degenerative changes in his AC joint.   ALLERGIES:  ASPIRIN CAUSES GI UPSET.   FOOD ALLERGY:  PATIENT HAS AN  INTOLERANCE TO PINEAPPLE, IT HAS CAUSED  HEADACHES IN THE PAST.   MEDICATIONS:  1. Ambien 10 mg one p.o. q.h.s. p.r.n. insomnia.  2. Lipitor 20 mg one daily.  3. Oxycodone 5/325 one tablet q.4 hours p.r.n. pain.  4. Prevacid 30 mg one p.o. b.i.d.  5. Zyrtec-D 5/20 mg one tab b.i.d.   SURGICAL PROCEDURE:  Patient was taken to the operating room on May 11, 2006 by Dr. Frederico Hamman, assisted by Dr. Norlene Campbell and  Arnoldo Morale, PA-C.  Patient was placed under general anesthesia and then  underwent a right shoulder hemiarthroplasty using a DePuy global  press-  fit shoulder with a 15 x 48 mm head-size 10 stent.  Patient tolerated  the procedure well and returned to recovery in good stable condition.   CONSULTS:  The following consults were obtained while patient was  hospitalized:  1. PT.  2. OT.   HOSPITAL COURSE:  Perioperatively, patient had to be in-and-out  catheterized due to patient not able to void before surgery in the a.m.,  700 mL of clear yellow urine was expelled.  Postop day one, patient with poor pain control.  No chest pain,  shortness of breath, nausea, vomiting or dizziness.  Patient with good  urinary output.  Positive bowel movement.  Hemoglobin at time of morning  rounds was 7.3 gm/dl, this was repeated and later checked in the day and  H&H was 15.5 and 45.9.  Patient otherwise afebrile, vital signs stable.   Postop day two, patient reports pain a 10/10, no chest pain or shortness  of breath.  Had poor night's sleep.  Patient without pain medicines per  nursing from 8:00 p.m. to 2:00 a.m.  Patient's H&H was 13.8 and 41.3.  Patient's T-max was 99.2.  Vital signs stable.  Pain meds were scheduled  around the clock.  Patient was placed on Ambien CR as at home.   Postop day three, patient's pain under much better control.  Able to  sleep through the night.  Tolerating diet, voiding well and a possible  movement.  Patient afebrile, vital signs stable.   H&H are stable at 13.9  and 40.3.  Right upper extremity wound is benign with full range of  motion in elbow, wrist and hand, the hand and arm are neurovascularly  intact.  Patient was ready for discharge and was discharged home later  that afternoon in good stable condition.   LABS:  Routine labs on admission:  CBC:  White count was 10,300,  hemoglobin was 16.1, hematocrit 48.2, platelets were 210.   Coags on admission:  All values within normal limits.   Routine chemistries on admission:  Sodium 138, potassium 3.8, chloride  was 103, bicarb 25.  Glucose was elevated at 103.  BUN was 17,  creatinine was 1.02.   Urinalysis and urine cultures were negative.   Hepatic enzymes on admission:  AB was 77.0 gm/dl, ALP was 4.0 gm/dl, AST  was 25 u/L, ALT was 25 u/L and t bil was 0.6 mg/dl.   DISCHARGE INSTRUCTIONS:  Diet:  No restrictions.   Wound care:  Patient is to keep wound clean, dry and change dressing  daily, may shower in two days if no drainage.   SPECIAL INSTRUCTIONS:  1. Gentle range of motion right arm, elbow, wrist and hand, sling at      all times otherwise.  2. Patient is to follow up with Dr. Madelon Lips 11 days from discharge,      patient is to call office at [phone] for appointment.   MEDICATIONS:  Patient is to resume home meds as the following:  1. OxyContin 10 mg sustained-release one tab q.12 hours for pain.  2. Percocet 5/325 one to two tablets every four to six hours for      breakthrough pain.  3. Robaxin 500 mg one tab every six to eight hours for spasm.   CONDITION AT TIME OF DISCHARGE:  Patient was discharged to home in good  stable condition.      Richardean Canal, Arnetha Courser, M.D.  Electronically Signed    GC/MEDQ  D:  08/14/2006  T:  08/14/2006  Job:  [id number]

## 2010-09-09 NOTE — Discharge Summary (Signed)
NAMEDODGER, SINNING            ACCOUNT NO.:  [id number]   MEDICAL RECORD NO.:  [id number]          PATIENT TYPE:  INP   LOCATION:  1621                         FACILITY:  [facility]   PHYSICIAN:  Rosalyn Gess. Norins, MD  DATE OF BIRTH:  June 20, 1960   DATE OF ADMISSION:  12/09/2005  DATE OF DISCHARGE:  12/11/2005                                 DISCHARGE SUMMARY   ADMITTING DIAGNOSIS:  Intractable nausea and vomiting.   DISCHARGE DIAGNOSIS:  Viral gastroenteritis with nausea and vomiting.   HISTORY OF PRESENT ILLNESS:  Patient is a 50 year old African-American  gentleman who presented with a three day history of nausea, vomiting and  crampy abdominal pain associated with diarrhea.  His symptoms had been  progressive over the 24 hours prior to this admission.  He was seen in the  emergency department for evaluation where labs were unremarkable and x-ray  showed no signs of obstruction.  Patient was admitted for IV fluids and  antiemetics.   Please see the H&P for past medical history, social history.  Examination at  that time was unremarkable except for mild abdominal comfort.   HOSPITAL COURSE:  Patient was admitted and supported with IV fluids and IV  antiemetics.  His laboratory evaluation as noted was unremarkable with  normal white count, normal hemoglobin and hematocrit.  Chemistries were  unremarkable except for a mild elevation in his serum glucose.  Liver  functions were normal.  Urinalysis was negative.  X-ray was unremarkable.  Patient responded to IV antiemetics.  On the day of discharge he was feeling  much better.  He was given a breakfast which he tolerated and then felt to  be stable to be discharged home.   DISCHARGE EXAMINATION:  VITAL SIGNS:  Temperature was 97.6, blood pressure  137/95, heart rate 62, respirations 20, O2 sat was 99%.  ABDOMEN:  Patient had hypoactive bowel sounds but he was not tender.   DISPOSITION:  Patient is discharged home.  He was given a  prescription for  Phenergan to take on an as-needed basis.  He will continue with his Prevacid  and Zyrtec.  He was to see Dr. Debby Bud for followup in 5-7 working days.  Patient's condition at time of discharge was improved.           ______________________________  [name], MD     MEN/MEDQ  D:  12/31/2005  T:  01/01/2006  Job:  [id number]

## 2010-09-09 NOTE — Assessment & Plan Note (Signed)
[facility]                             PRIMARY CARE OFFICE NOTE   NAME:[name], [name]                     MRN:          [id number]  DATE:03/05/2006                            DOB:          1961-01-16    [name] is a 50 year old African-American gentleman who presents with a  problem of right shoulder pain.  He reports a 1 week history where he has  had a significant problem with pain in his right shoulder with movement,  particularly pushing or pulling.  He reports when his arm is hanging and  dangling it also hurts.  He denies an injury but did have some questionable  overuse about a week ago when the pain started having carved meat for a  prolonged period of time.  He denies any trauma or any other sudden injury.  He does have a history of having some tingling and pain in his neck and  shoulder which has been intermittent for some time.   MEDICAL PROBLEMS:  1. GERD.  2. Hypertension.  3. History of cluster headache which has been actually doing relatively      well.  4. Patient did have hospitalization for abdominal pain with no surgical      intervention required.   CURRENT MEDICATIONS:  1. Prevacid 30 mg b.i.d.  2. Zyrtec D.  3. Lipitor 20 mg daily.   VITAL SIGNS:  Temperature is 97.6.  Blood pressure 139/90. Pulse 68.  Weight  235.  GENERAL APPEARANCE:  This is a well-nourished, athletic appearing gentleman  in no acute distress.  MUSCULOSKELETAL:  Patient has limited range of motion of his right shoulder  in all degrees secondary to pain and discomfort.  He has the greatest amount  of problem with extension and rotation.  He had normal sensation to light  touch.  He had normal DTRs at the biceps and radial tendons.  Moving his  shoulder there was no crepitus.   JOINT INJECTION:  Because of the possible bursitis, joint injection was  offered.  After informed verbal consent was obtained the lateral aspects of  the right  shoulder was prepped with Betadine and alcohol, anesthetized with  ethylene chloride, and injected with approximately 1 cc of Sensorcaine and  40 mg of methylprednisolone.  There was no incident with this, it was an  easy injection with entering the joint space on the first pass.   Following injection the patient had no improvement in the pain or  discomfort.  Continued to have significant limited range of motion.   ASSESSMENT AND PLAN:  Concern for radiculopathy given that cortisone and  Sensorcaine injection caused no relief.  Patient has given history of some  probable pain in his right neck and trapezius areas.   Patient started on prednisone burst and taper at 40 mg x 1, 30 mg q. daily  x3, 20 mg daily x3, 10 mg daily x6.  He was sent for full C-spine films with  flexion and extension to be performed today.  We will move to MRI if there  is any loss of disc space or  significant bony abnormality.     [name], MD  Electronically Signed    MEN/MedQ  DD: 03/05/2006  DT: 03/05/2006  Job #: [id number]

## 2010-10-03 ENCOUNTER — Ambulatory Visit (INDEPENDENT_AMBULATORY_CARE_PROVIDER_SITE_OTHER): Payer: Commercial Managed Care - PPO | Admitting: Internal Medicine

## 2010-10-03 ENCOUNTER — Encounter: Payer: Self-pay | Admitting: Internal Medicine

## 2010-10-03 ENCOUNTER — Telehealth: Payer: Self-pay | Admitting: Internal Medicine

## 2010-10-03 ENCOUNTER — Other Ambulatory Visit (INDEPENDENT_AMBULATORY_CARE_PROVIDER_SITE_OTHER): Payer: Commercial Managed Care - PPO

## 2010-10-03 VITALS — BP 148/96 | HR 72 | Temp 98.3°F | Wt 230.0 lb

## 2010-10-03 DIAGNOSIS — R599 Enlarged lymph nodes, unspecified: Secondary | ICD-10-CM

## 2010-10-03 DIAGNOSIS — R591 Generalized enlarged lymph nodes: Secondary | ICD-10-CM

## 2010-10-03 DIAGNOSIS — I10 Essential (primary) hypertension: Secondary | ICD-10-CM

## 2010-10-03 LAB — CBC WITH DIFFERENTIAL/PLATELET
Basophils Absolute: 0.1 10*3/uL (ref 0.0–0.1)
Basophils Relative: 0.7 % (ref 0.0–3.0)
Eosinophils Absolute: 0.2 10*3/uL (ref 0.0–0.7)
Eosinophils Relative: 2.5 % (ref 0.0–5.0)
HCT: 44.8 % (ref 39.0–52.0)
Hemoglobin: 15.4 g/dL (ref 13.0–17.0)
Lymphocytes Relative: 41.7 % (ref 12.0–46.0)
Lymphs Abs: 3.9 10*3/uL (ref 0.7–4.0)
MCHC: 34.3 g/dL (ref 30.0–36.0)
MCV: 91 fl (ref 78.0–100.0)
Monocytes Absolute: 0.9 10*3/uL (ref 0.1–1.0)
Monocytes Relative: 9.3 % (ref 3.0–12.0)
Neutro Abs: 4.2 10*3/uL (ref 1.4–7.7)
Neutrophils Relative %: 45.8 % (ref 43.0–77.0)
Platelets: 172 10*3/uL (ref 150.0–400.0)
RBC: 4.92 Mil/uL (ref 4.22–5.81)
RDW: 14.2 % (ref 11.5–14.6)
WBC: 9.3 10*3/uL (ref 4.5–10.5)

## 2010-10-03 MED ORDER — DILTIAZEM HCL ER BEADS 240 MG PO CP24: 180.0000 mg | ORAL_CAPSULE | Freq: Every day | ORAL | Status: DC

## 2010-10-03 NOTE — Telephone Encounter (Signed)
Called pt's wife - wbc nl - go with the 10 days of aspirin

## 2010-10-03 NOTE — Progress Notes (Signed)
Subjective:    Patient ID: [name], male    DOB: [birth year], 50 y.o.   MRN: [id number]  HPI Mr. Liebler presents with a month long history of discomfort in the submandibular region. He has not had any fever, chills, no weight loss, no pulmonary problems. He has had no acute changes in his discomfort, just persistent. He has no dental complaints.   In the interval since his last visit he has had redo reconstruction of the right shoulder and relocation of the short head of the biceps with pretty good results.   Did review with the patient his use of high dose simvastatin and a CCB: he has been on CCB plus vytorin 10/80 for more than a year without complications and in accord with FDA bulletin there is no need to change the CCB or the Vytorin.   Past Medical History  Diagnosis Date  . Hypertension   . Unspecified intestinal obstruction   . Unspecified asthma   . Chest pain, atypical   . Pulmonary nodule   . Nontoxic multinodular goiter   . Painful respiration   . Dyspnea on exertion   . Trigeminal neuralgia   . Cluster headache   . Hyperlipidemia   . GERD (gastroesophageal reflux disease)    Past Surgical History  Procedure Date  . Appendectomy   . Knee arthroscopy   . 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 Right shoulder redo-reconstruction 06/21/10   Family History  Problem Relation Age of Onset  . Diabetes Mother   . Hyperlipidemia Mother   . Hypertension Mother   . Hypertension Father   . Heart disease Father     CAD/CHF  . Cancer Father     Mesothelioma  . Heart disease Maternal Aunt   . Prostate cancer Neg Hx   . Colon cancer Neg Hx    History   Social History  . Marital Status: Married    Spouse Name: N/A    Number of Children: N/A  . 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 Smoking status: Current Everyday Smoker  . Smokeless tobacco: Not on  file   Comment: 1-2 cigs per day, started age 33  . Alcohol Use: No  . Drug Use: No  . Sexually Active: Not on file   Other Topics Concern  . Not on file   Social History Narrative   [place] 5 years divorced; remarried '961 son - '85Regular Exercise -  NO     Review of Systems Review of Systems  Constitutional:  Negative for fever, chills, activity change and unexpected weight change.  HEENT:  Negative for hearing loss, ear pain, congestion, neck stiffness and postnasal drip. Positive for sore throat,  no swallowing problems. Negative for dental complaints.   Eyes: Negative for vision loss or change in visual acuity.  Respiratory: Negative for chest tightness and wheezing.   Cardiovascular: Negative for chest pain and palpitation. No decreased exercise tolerance Gastrointestinal: No change in bowel habit. No bloating or gas. No reflux or indigestion Genitourinary: Negative for urgency, frequency, flank pain and difficulty urinating.  Musculoskeletal: Negative for myalgias, back pain, arthralgias and gait problem.  Neurological: Negative for dizziness, tremors, weakness and headaches.  Hematological: Negative for adenopathy.  Psychiatric/Behavioral: Negative for behavioral problems and dysphoric mood.       Objective:   Physical  Exam vitals reviewed. Gen'l- wNWD AA male in no distress   HEENT - Wise/AT, EACs/TMs normal, oropharynx without lesions, floor of the mouth normal to palpation. Neck- supple, thyromegaly noted without discrete nodules to exam Nodes - shotty submandibular nodes bilaterally that are tender to palpation Lungs - clear to A&P   Assessment & Plan:  1. lympadenopathy-submandibular: no evidence on exam of bacterial infection.  Plan - CBC, if WBC elevated - antibiotics; if WBC nl 160 mg ASA bid x 10 days (GI precautions); if no improvement will image neck.   2. Blood pressure - suboptimal control.  Plan - increase  dilatiazem to 240mg  qd. If BP remains elevated will need to add diuretic.

## 2010-10-03 NOTE — Patient Instructions (Signed)
Lymphadenopathy - small swollen glands below the jaw line. There are no abnormal findings on examination of the throat, ears, mouth. Plan - Blood count: if the white count is up will treat with antibiotics; if the white count is normal take aspirin 2 x 81mg  AM and PM for 10 days. If the swollen glands don't go down call and I will set up an imaging study for a better diagnosis.  Blood pressure is too high. Plan - increase diltazem to 240 mg at next prescription. If this doesn't bring BP to goal will add a diuretic.

## 2010-12-20 ENCOUNTER — Encounter (HOSPITAL_BASED_OUTPATIENT_CLINIC_OR_DEPARTMENT_OTHER): Payer: Self-pay | Admitting: *Deleted

## 2010-12-20 ENCOUNTER — Emergency Department (INDEPENDENT_AMBULATORY_CARE_PROVIDER_SITE_OTHER): Payer: Commercial Managed Care - PPO

## 2010-12-20 ENCOUNTER — Emergency Department (HOSPITAL_BASED_OUTPATIENT_CLINIC_OR_DEPARTMENT_OTHER)
Admission: EM | Admit: 2010-12-20 | Discharge: 2010-12-21 | Disposition: A | Discharge status: Home / Self Care | Payer: Commercial Managed Care - PPO | Attending: Emergency Medicine | Admitting: Emergency Medicine

## 2010-12-20 DIAGNOSIS — F17200 Nicotine dependence, unspecified, uncomplicated: Secondary | ICD-10-CM | POA: Insufficient documentation

## 2010-12-20 DIAGNOSIS — R5381 Other malaise: Secondary | ICD-10-CM | POA: Insufficient documentation

## 2010-12-20 DIAGNOSIS — R0789 Other chest pain: Secondary | ICD-10-CM

## 2010-12-20 DIAGNOSIS — I10 Essential (primary) hypertension: Secondary | ICD-10-CM | POA: Insufficient documentation

## 2010-12-20 DIAGNOSIS — R748 Abnormal levels of other serum enzymes: Secondary | ICD-10-CM | POA: Insufficient documentation

## 2010-12-20 DIAGNOSIS — G44009 Cluster headache syndrome, unspecified, not intractable: Secondary | ICD-10-CM

## 2010-12-20 NOTE — ED Notes (Signed)
Patient states he has been experiencing HA/SOB/fatigue over the past week, has been taking motrin, no relief, hard to sleep, Hx of cluster HA, no N/V, no CP. Heart cath in Feb, but no intervention taken. Elevated BP over the past week

## 2010-12-21 DIAGNOSIS — R0602 Shortness of breath: Secondary | ICD-10-CM

## 2010-12-21 LAB — BASIC METABOLIC PANEL
BUN: 15 mg/dL (ref 6–23)
CO2: 29 mEq/L (ref 19–32)
Calcium: 9.6 mg/dL (ref 8.4–10.5)
Chloride: 104 mEq/L (ref 96–112)
Creatinine, Ser: 1 mg/dL (ref 0.50–1.35)
GFR calc Af Amer: >60 mL/min (ref >60)
GFR calc non Af Amer: >60 mL/min (ref >60)
Glucose, Bld: 88 mg/dL (ref 70–99)
Potassium: 3.9 mEq/L (ref 3.5–5.1)
Sodium: 142 mEq/L (ref 135–145)

## 2010-12-21 LAB — CARDIAC PANEL(CRET KIN+CKTOT+MB+TROPI)
CK, MB: 5.4 ng/mL — ABNORMAL HIGH (ref 0.3–4.0)
CK, MB: 4.8 ng/mL — ABNORMAL HIGH (ref 0.3–4.0)
Relative Index: 0.6 (ref 0.0–2.5)
Relative Index: 0.6 (ref 0.0–2.5)
Total CK: 849 U/L — ABNORMAL HIGH (ref 7–232)
Total CK: 757 U/L — ABNORMAL HIGH (ref 7–232)
Troponin I: <0.3 ng/mL (ref <0.30)
Troponin I: <0.3 ng/mL (ref <0.30)

## 2010-12-21 LAB — DIFFERENTIAL
Basophils Absolute: 0 10*3/uL (ref 0.0–0.1)
Basophils Relative: 1 % (ref 0–1)
Eosinophils Absolute: 0.3 10*3/uL (ref 0.0–0.7)
Eosinophils Relative: 3 % (ref 0–5)
Lymphocytes Relative: 38 % (ref 12–46)
Lymphs Abs: 3.1 10*3/uL (ref 0.7–4.0)
Monocytes Absolute: 0.8 10*3/uL (ref 0.1–1.0)
Monocytes Relative: 10 % (ref 3–12)
Neutro Abs: 3.9 10*3/uL (ref 1.7–7.7)
Neutrophils Relative %: 48 % (ref 43–77)

## 2010-12-21 LAB — CBC
HCT: 43.1 % (ref 39.0–52.0)
Hemoglobin: 14.9 g/dL (ref 13.0–17.0)
MCH: 30.7 pg (ref 26.0–34.0)
MCHC: 34.6 g/dL (ref 30.0–36.0)
MCV: 88.9 fL (ref 78.0–100.0)
Platelets: 172 10*3/uL (ref 150–400)
RBC: 4.85 MIL/uL (ref 4.22–5.81)
RDW: 13 % (ref 11.5–15.5)
WBC: 8.2 10*3/uL (ref 4.0–10.5)

## 2010-12-21 LAB — PRO B NATRIURETIC PEPTIDE: Pro B Natriuretic peptide (BNP): 26.2 pg/mL (ref 0–125)

## 2010-12-21 MED ORDER — MORPHINE SULFATE 4 MG/ML IJ SOLN
4.0000 mg | Freq: Once | INTRAMUSCULAR | Status: AC
  Administered 2010-12-21: 4 mg via INTRAVENOUS
  Filled 2010-12-21: qty 1

## 2010-12-21 MED ORDER — DIPHENHYDRAMINE HCL 50 MG/ML IJ SOLN
25.0000 mg | Freq: Once | INTRAMUSCULAR | Status: AC
  Administered 2010-12-21: 50 mg via INTRAVENOUS
  Filled 2010-12-21: qty 1

## 2010-12-21 MED ORDER — SODIUM CHLORIDE 0.9 % IV BOLUS (SEPSIS)
1000.0000 mL | Freq: Once | INTRAVENOUS | Status: AC
  Administered 2010-12-21: 1000 mL via INTRAVENOUS

## 2010-12-21 MED ORDER — METOCLOPRAMIDE HCL 5 MG/ML IJ SOLN
10.0000 mg | Freq: Once | INTRAMUSCULAR | Status: AC
  Administered 2010-12-21: 10 mg via INTRAVENOUS
  Filled 2010-12-21: qty 2

## 2010-12-21 NOTE — ED Provider Notes (Signed)
History     CSN: [id number] Arrival date & time: 12/20/2010 11:38 PM  Chief Complaint  Patient presents with  . Shortness of Breath   HPI Comments: 49yoM h/o HTN, smoker, mild obstructive lung disease, cluster headaches pw multiple complaints. Pt states that he has experienced persisent headache and fatigue x 1 week. Then for past 2-3 days began to experience intermittent left chest "pinching" (no radiation, no n/v/diaphoresis/fever/chills/cough) and intermittent sob. Not worse with exertion. No orthopnea/PND. No wheezing. Denies cp/sob currently. Denies numbness/tingling/weakness of extremities. State his headache is frontal and feels like his typical cluster headache. No body aches. No sick contacts.  Both wife and patient report that he is under excessive stress at home and work. Pt states he had a "normal" cardiac cath earlier this year, cannot remember exact date but unable to find in our system-- reviewed cath 2010 with mild non obstructive disease. Pt with previous PFTs with mild obstructive disease (although noted to have questionable technique during study). Chart review reveals recent CTA eval PE for SOB which were negative PE. States that his blood pressure is usually elevated when he has a headache. Does report recent change in his antihypertensives for better bp control.   Reports compliance with his antihypertensive medications    Griffin Basil, RN 12/20/2010 23:40     Patient states he has been experiencing HA/SOB/fatigue over the past week, has been taking motrin, no relief, hard to sleep, Hx of cluster HA, no N/V, no CP. Heart cath in Feb, but no intervention taken. Elevated BP over the past week    Patient is a 50 y.o. male presenting with shortness of breath.  Shortness of Breath  Associated symptoms include shortness of breath.    Past Medical History  Diagnosis Date  . Hypertension   . Unspecified intestinal obstruction   . Unspecified asthma   . Chest pain, atypical    . Pulmonary nodule   . Nontoxic multinodular goiter   . Painful respiration   . Dyspnea on exertion   . Trigeminal neuralgia   . Cluster headache   . Hyperlipidemia   . GERD (gastroesophageal reflux disease)     Past Surgical History  Procedure Date  . Appendectomy   . Knee arthroscopy   . 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 Right shoulder redo-reconstruction 06/21/10    Family History  Problem Relation Age of Onset  . Diabetes Mother   . Hyperlipidemia Mother   . Hypertension Mother   . Hypertension Father   . Heart disease Father     CAD/CHF  . Cancer Father     Mesothelioma  . Heart disease Maternal Aunt   . Prostate cancer Neg Hx   . Colon cancer Neg Hx     History  Substance Use Topics  . Smoking status: Current Everyday Smoker  . Smokeless tobacco: Not on file   Comment: 1-2 cigs per day, started age 60  . Alcohol Use: Yes      Review of Systems  Respiratory: Positive for shortness of breath.   All other systems reviewed and are negative.  except as noted HPI   Physical Exam  BP 182/100  Pulse 71  Temp(Src) 98.6 F (37 C) (Oral)  Resp 19  SpO2 100%  Physical Exam  Nursing note and vitals reviewed. Constitutional: He is oriented to person, place, and time. He appears well-developed and well-nourished. No distress.  HENT:  Head: Atraumatic.  Mouth/Throat: Oropharynx  is clear and moist.  Eyes: Conjunctivae are normal. Pupils are equal, round, and reactive to light.  Neck: Neck supple.  Cardiovascular: Normal rate, regular rhythm, normal heart sounds and intact distal pulses.  Exam reveals no gallop and no friction rub.   No murmur heard. Pulmonary/Chest: Effort normal. No respiratory distress. He has no wheezes. He has no rales. He exhibits no tenderness.  Abdominal: Soft. Bowel sounds are normal. There is no tenderness. There is no rebound and no guarding.  Musculoskeletal: Normal range of motion. He  exhibits no edema and no tenderness.  Neurological: He is alert and oriented to person, place, and time. No cranial nerve deficit. Coordination normal.       Strength 5/5 all extremities No pronator drift No facial droop   Skin: Skin is warm and dry.  Psychiatric: He has a normal mood and affect.     Date: 12/21/2010  Rate: 63  Rhythm: normal sinus rhythm  QRS Axis: normal  Intervals: normal  ST/T Wave abnormalities: nonspecific T wave changes  Conduction Disutrbances:none  Narrative Interpretation:   Old EKG Reviewed: unchanged   ED Course  Procedures  MDM  49yoM pw multiple complaints. He is apparently under extreme stress at home.   1. Headache- typical of previous cluster headache. Will treat with oxygen and analgesia 2. CP/SOB- atypical chest pain, low TIMI. DDx includes ACS, pneumonia, stress/anxiety, less likely myocarditis/endocarditis, PE. Will check CXR, EKG, labs including trop x 2. Reassess  Stefano Gaul, MD   2:17 AM  Pt states headache better with oxygen/reglan/benadryl. Continues to deny cp/sob. XR reviewed, labs reviewed and unremarkable except elevated CK and CK-MB with relative index wnl. He does have consistently elevated CKs on prior evaluations. Will give IVF and recheck CK and CE 4 hours from initial labs. Reassess  2:26 AM  Pt now asking nursing staff for pain medication for headache.morphine ordered.  3:12 AM   Filed Vitals:   12/21/10 0309  BP: 131/82  Pulse: 58  Temp:   Resp: 18    Blood pressure as above resolved with control of headache  4:40 AM  Pt states he feels much better. Repeat labs noted. Elevated CK resolving after IVF (500cc). Advised pt that we would give him more IVF and recheck but he wants to go home and follow up with his MD.  Of note, patient does not have any msk ttp, and renal function wnl. Has baseline CKs in the 400s-500s.   Patient / Family / Caregiver informed of clinical course, understand medical  decision-making process, and agree with plan. Pt feels improved after observation and/or treatment in ED.    Forbes Cellar, MD 12/21/10 [phone]

## 2010-12-23 ENCOUNTER — Encounter: Payer: Self-pay | Admitting: Internal Medicine

## 2010-12-23 ENCOUNTER — Ambulatory Visit (INDEPENDENT_AMBULATORY_CARE_PROVIDER_SITE_OTHER): Payer: Commercial Managed Care - PPO | Admitting: Internal Medicine

## 2010-12-23 DIAGNOSIS — F411 Generalized anxiety disorder: Secondary | ICD-10-CM

## 2010-12-23 DIAGNOSIS — F419 Anxiety disorder, unspecified: Secondary | ICD-10-CM

## 2010-12-23 DIAGNOSIS — G43809 Other migraine, not intractable, without status migrainosus: Secondary | ICD-10-CM

## 2010-12-23 MED ORDER — OXYCODONE-ACETAMINOPHEN 7.5-325 MG PO TABS: 1.0000 | ORAL_TABLET | ORAL | Status: DC | PRN

## 2010-12-23 MED ORDER — ZOLPIDEM TARTRATE 10 MG PO TABS: 10.0000 mg | ORAL_TABLET | Freq: Every evening | ORAL | Status: AC | PRN

## 2010-12-23 MED ORDER — SERTRALINE HCL 50 MG PO TABS: 50.0000 mg | ORAL_TABLET | Freq: Every day | ORAL | Status: DC

## 2010-12-23 NOTE — Progress Notes (Signed)
  Subjective:    Patient ID: [name], male    DOB: 1960/08/12, 50 y.o.   MRN: [id number]  HPI Mr. Westerman presents after ED evaluation for chest pain and cluster headache. He ruled out. He did do well with oxygen and hypertension and narcotics. He reports that he is under a lot of stress: house problems, car repairs, work, family issues. His cluster headaches are an old problem and have been related to stress in the past. His headaches are present but better. He is not having any continue chest pain.   I have reviewed the patient's medical history in detail and updated the computerized patient record.    Review of Systems System review is negative for any constitutional, cardiac, pulmonary, GI or neuro symptoms or complaints at today's visit     Objective:   Physical Exam Vitals reviewed - BP a little high Gen'l- heavyset AA man in no distress Resp - normal Cor RRR Neuro - A&O x 3, CN II-XII normal, no ataxia.       Assessment & Plan:

## 2010-12-23 NOTE — Patient Instructions (Signed)
Cluster headaches - keep hydrated. OK to use percocet 7.5/325 every 4-6 hours as needed.  Stress - may be the cause of cluster headaches. Plan - sertraline 50mg  once a day in the PM. Also if needed generic ambien to promote sleep.   Blood pressure - ok today. Will hold off making any change in medications until we see how your blood pressure does after getting the headaches and stress under better control.

## 2010-12-26 DIAGNOSIS — F419 Anxiety disorder, unspecified: Secondary | ICD-10-CM | POA: Insufficient documentation

## 2010-12-26 NOTE — Assessment & Plan Note (Signed)
Many issues: home repairs that are major and unexpected, work pressure, death in the family during the year.  Plan - start sertraline 50 mg qd            For sleep - ambien 210 mg qhs

## 2010-12-26 NOTE — Assessment & Plan Note (Signed)
Improving, but he feels stress is playing a big role as trigger for headaches.

## 2011-01-09 ENCOUNTER — Other Ambulatory Visit: Payer: Self-pay | Admitting: Internal Medicine

## 2011-01-24 ENCOUNTER — Other Ambulatory Visit: Payer: Self-pay | Admitting: Internal Medicine

## 2011-02-13 ENCOUNTER — Telehealth: Payer: Self-pay | Admitting: *Deleted

## 2011-02-13 NOTE — Telephone Encounter (Signed)
Spoke with patient - he c/o fever, nausea, diarrhea, cough hs and clear sinus drainage since Friday. Fever and diarrhea are gone. Advised patient to continue to rest, drink clear liquids and will come into the office if he doesn't continue to improve, pt agreed.

## 2011-03-01 ENCOUNTER — Other Ambulatory Visit: Payer: Self-pay | Admitting: *Deleted

## 2011-03-01 MED ORDER — PANTOPRAZOLE SODIUM 40 MG PO TBEC: 40.0000 mg | DELAYED_RELEASE_TABLET | Freq: Every day | ORAL | Status: DC

## 2011-03-24 ENCOUNTER — Other Ambulatory Visit: Payer: Self-pay | Admitting: Internal Medicine

## 2011-06-02 ENCOUNTER — Other Ambulatory Visit: Payer: Self-pay | Admitting: Internal Medicine

## 2011-06-02 NOTE — Telephone Encounter (Signed)
Done

## 2011-06-16 ENCOUNTER — Encounter: Payer: Self-pay | Admitting: Endocrinology

## 2011-06-16 ENCOUNTER — Ambulatory Visit (INDEPENDENT_AMBULATORY_CARE_PROVIDER_SITE_OTHER): Payer: Commercial Managed Care - PPO | Admitting: Endocrinology

## 2011-06-16 DIAGNOSIS — G43809 Other migraine, not intractable, without status migrainosus: Secondary | ICD-10-CM

## 2011-06-16 MED ORDER — OXYCODONE-ACETAMINOPHEN 5-325 MG PO TABS: 1.0000 | ORAL_TABLET | ORAL | Status: DC | PRN

## 2011-06-16 MED ORDER — PROMETHAZINE HCL 12.5 MG PO TABS: 12.5000 mg | ORAL_TABLET | ORAL | Status: DC

## 2011-06-16 NOTE — Patient Instructions (Addendum)
Here are 2 prescriptions--pain and nausea. I hope you feel better soon.  If not, please call back.  We can give yo an injection if you have a ride home. Please see your neurologist as scheduled.

## 2011-06-16 NOTE — Progress Notes (Signed)
Subjective:    Patient ID: [name], male    DOB: [birth year], 51 y.o.   MRN: [id number]  HPI Pt states 3 days of severe headache, worst at the right frontal area (his typical location).  No help with motrin.  He has not recently taken percocet.  He is scheduled to see neurol in 3 weeks.  He has assoc nausea, but no vomiting.   Past Medical History  Diagnosis Date  . Hypertension   . Unspecified intestinal obstruction   . Unspecified asthma   . Chest pain, atypical   . Pulmonary nodule   . Nontoxic multinodular goiter   . Painful respiration   . Dyspnea on exertion   . Trigeminal neuralgia   . Cluster headache   . Hyperlipidemia   . GERD (gastroesophageal reflux disease)     Past Surgical History  Procedure Date  . Appendectomy   . Knee arthroscopy   . 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 Right shoulder redo-reconstruction 06/21/10    History   Social History  . Marital Status: Married    Spouse Name: N/A    Number of Children: N/A  . 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 Smoking status: Current Everyday Smoker  . Smokeless tobacco: Not on file   Comment: 1-2 cigs per day, started age 28  . Alcohol Use: Yes  . Drug Use: No  . Sexually Active: Not on file   Other Topics Concern  . 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 aspirin 81 MG EC tablet Take 81 mg by mouth daily.        . cetirizine-pseudoephedrine (ZYRTEC-D) 5-120 MG per tablet Take 1 tablet by mouth 2 (two) times daily.        Marland Kitchen diltiazem (TIAZAC) 240 MG 24 hr capsule Take 1 capsule (240 mg total) by mouth daily.  30 capsule  11  . ezetimibe-simvastatin (VYTORIN) 10-80 MG per tablet  Take 1 tablet by mouth at bedtime.        . lansoprazole (PREVACID) 30 MG capsule TAKE ONE CAPSULE BY MOUTH DAILY  30 capsule  1  . levothyroxine (SYNTHROID, LEVOTHROID) 25 MCG tablet TAKE 1 TABLET BY MOUTH DAILY  30 tablet  2  . pantoprazole (PROTONIX) 40 MG tablet Take 1 tablet (40 mg total) by mouth daily.  90 tablet  3  . sertraline (ZOLOFT) 50 MG tablet Take 1 tablet (50 mg total) by mouth daily.  30 tablet  6    No Known Allergies  Family History  Problem Relation Age of Onset  . Diabetes Mother   . Hyperlipidemia Mother   . Hypertension Mother   . Hypertension Father   . Heart disease Father     CAD/CHF  . Cancer Father     Mesothelioma  . Heart disease Maternal Aunt   . Prostate cancer Neg Hx   . Colon cancer Neg Hx     BP 142/90  Pulse 88  Temp(Src) 96.8 F (36 C) (Oral)  Wt 236 lb 12.8 oz (107.412 kg)  SpO2 95%    Review of Systems Denies visual loss and loc.  Objective:   Physical Exam VS: see vs page GEN: no distress HEAD: head: no deformity Optic fundi: normal eyes: no periorbital swelling, no proptosis external nose and ears are normal mouth: no lesion seen NECK: supple, thyroid is not enlarged.   NEURO:  cn 2-12 grossly intact (except decreased sensation on the right side of the face--surgery for trigeminal neuralgia).   readily moves all 4's.  sensation is intact to touch on the feet  NODES:  None palpable at the neck PSYCH: alert, oriented x3.  Does not appear anxious nor depressed.      Assessment & Plan:  exac of chronic cluster headache.  He can't receive an injection because he does not have a ride home HTN, prob exac by headache

## 2011-06-19 ENCOUNTER — Ambulatory Visit (INDEPENDENT_AMBULATORY_CARE_PROVIDER_SITE_OTHER): Payer: Commercial Managed Care - PPO | Admitting: Internal Medicine

## 2011-06-19 ENCOUNTER — Encounter: Payer: Self-pay | Admitting: Internal Medicine

## 2011-06-19 VITALS — BP 122/80 | HR 63 | Temp 98.4°F | Resp 16 | Wt 231.5 lb

## 2011-06-19 DIAGNOSIS — G43809 Other migraine, not intractable, without status migrainosus: Secondary | ICD-10-CM

## 2011-06-19 MED ORDER — MEPERIDINE HCL 50 MG/ML IJ SOLN
50.0000 mg | Freq: Once | INTRAMUSCULAR | Status: AC
  Administered 2011-06-19: 50 mg via INTRAMUSCULAR

## 2011-06-19 MED ORDER — PREDNISONE 20 MG PO TABS: ORAL_TABLET | ORAL | Status: DC

## 2011-06-19 MED ORDER — PROMETHAZINE HCL 25 MG/ML IJ SOLN
25.0000 mg | Freq: Once | INTRAMUSCULAR | Status: AC
  Administered 2011-06-19: 25 mg via INTRAMUSCULAR

## 2011-06-19 MED ORDER — MEPERIDINE HCL 25 MG/ML IJ SOLN
25.0000 mg | Freq: Once | INTRAMUSCULAR | Status: AC
  Administered 2011-06-19: 25 mg via INTRAMUSCULAR

## 2011-06-19 MED ORDER — DILTIAZEM HCL ER COATED BEADS 360 MG PO CP24: 360.0000 mg | ORAL_CAPSULE | Freq: Every day | ORAL | Status: DC

## 2011-06-20 NOTE — Assessment & Plan Note (Signed)
Persistent cluster HA. Non-focal neuro exam. No relief with oxygen at 12 l by non-rebreather mask plus Zomig 5 mg. Minimal relief with demerol/phenergan 75/25 x 2 injections.  Plan - prednisone 80 mg daily until seen Thursday by Dr. Clarisse [name]           Zomig 2.5 -5mg  with a max of 10 mg per 24 hrs - samples provided           Use percocet as needed.           Follow-up with Dr. Clarisse [name] Thursday, Feb 28th

## 2011-06-20 NOTE — Progress Notes (Signed)
  Subjective:    Patient ID: [name], male    DOB: Jan 28, 1961, 51 y.o.   MRN: [id number]  HPI [name] present for follow-up of severe headache. He was seen Feb 22nd by Dr. Sherrilyn Rist - he was diagnosed with recurrent cluster HA and given percocet for pain. He was to return for demerol injection Saturday but narcotics injections are not done in Saturday clinic. He has continued to have HA pain. He has a h/o cluster headache that has been severe. Last bout approximately 3 years ago.  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 He has had trigeminal surgery x 2: in [place] and at [facility]. He reports his headche is the same as previous episodes.  Past Medical History  Diagnosis Date  . Hypertension   . Unspecified intestinal obstruction   . Unspecified asthma   . Chest pain, atypical   . Pulmonary nodule   . Nontoxic multinodular goiter   . Painful respiration   . Dyspnea on exertion   . Trigeminal neuralgia   . Cluster headache   . Hyperlipidemia   . GERD (gastroesophageal reflux disease)    Past Surgical History  Procedure Date  . Appendectomy   . Knee arthroscopy   . 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 Right shoulder redo-reconstruction 06/21/10   Family History  Problem Relation Age of Onset  . Diabetes Mother   . Hyperlipidemia Mother   . Hypertension Mother   . Hypertension Father   . Heart disease Father     CAD/CHF  . Cancer Father     Mesothelioma  . Heart disease Maternal Aunt   . Prostate cancer Neg Hx   . Colon cancer Neg Hx    History   Social History  . Marital Status: Married    Spouse Name: N/A    Number of Children: N/A  . Years of Education: N/A   Occupational History  . [employer]    Social History  Main Topics  . Smoking status: Current Everyday Smoker  . Smokeless tobacco: Not on file   Comment: 1-2 cigs per day, started age 89  . Alcohol Use: Yes  . Drug Use: No  . Sexually Active: Not on file   Other Topics Concern  . Not on file   Social History Narrative   [place] 5 years divorced; remarried '961 son - '85Regular Exercise -  NO       Review of Systems System review is negative for any constitutional, cardiac, pulmonary, GI or neuro symptoms or complaints other than as described in the HPI.     Objective:   Physical Exam Filed Vitals:   06/19/11 1322  BP: 122/80  Pulse: 63  Temp: 98.4 F (36.9 C)  Resp: 16   Gen'l - heavyset AA man in no distress but in the throes of a bad HA PUlm - normal respirations Cor - RRR Neuro - A&O x 3, CN II-XII grossly normal, no ataxia.       Assessment & Plan:

## 2011-07-02 ENCOUNTER — Other Ambulatory Visit: Payer: Self-pay | Admitting: Internal Medicine

## 2011-07-10 ENCOUNTER — Ambulatory Visit (INDEPENDENT_AMBULATORY_CARE_PROVIDER_SITE_OTHER)
Admission: RE | Admit: 2011-07-10 | Discharge: 2011-07-10 | Disposition: A | Discharge status: Home / Self Care | Payer: Commercial Managed Care - PPO | Source: Ambulatory Visit | Attending: Endocrinology | Admitting: Endocrinology

## 2011-07-10 ENCOUNTER — Ambulatory Visit (INDEPENDENT_AMBULATORY_CARE_PROVIDER_SITE_OTHER): Payer: Commercial Managed Care - PPO | Admitting: Endocrinology

## 2011-07-10 ENCOUNTER — Encounter: Payer: Self-pay | Admitting: Endocrinology

## 2011-07-10 VITALS — BP 130/92 | HR 91 | Temp 97.9°F | Wt 237.0 lb

## 2011-07-10 DIAGNOSIS — R05 Cough: Secondary | ICD-10-CM

## 2011-07-10 DIAGNOSIS — R059 Cough, unspecified: Secondary | ICD-10-CM

## 2011-07-10 MED ORDER — CEFUROXIME AXETIL 250 MG PO TABS: 250.0000 mg | ORAL_TABLET | Freq: Two times a day (BID) | ORAL | Status: AC

## 2011-07-10 MED ORDER — PROMETHAZINE-CODEINE 6.25-10 MG/5ML PO SYRP: 5.0000 mL | ORAL_SOLUTION | ORAL | Status: DC | PRN

## 2011-07-10 NOTE — Progress Notes (Signed)
Subjective:    Patient ID: [name], male    DOB: 09-18-1960, 51 y.o.   MRN: [id number]  HPI Pt states few days of moderate congestion in the nose, and assoc prod cough and wheezing.    Past Medical History  Diagnosis Date  . Hypertension   . Unspecified intestinal obstruction   . Unspecified asthma   . Chest pain, atypical   . Pulmonary nodule   . Nontoxic multinodular goiter   . Painful respiration   . Dyspnea on exertion   . Trigeminal neuralgia   . Cluster headache   . Hyperlipidemia   . GERD (gastroesophageal reflux disease)     Past Surgical History  Procedure Date  . Appendectomy   . Knee arthroscopy   . 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 Right shoulder redo-reconstruction 06/21/10    History   Social History  . Marital Status: Married    Spouse Name: N/A    Number of Children: N/A  . 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 Smoking status: Current Everyday Smoker  . Smokeless tobacco: Not on file   Comment: 1-2 cigs per day, started age 43  . Alcohol Use: Yes  . Drug Use: No  . Sexually Active: Not on file   Other Topics Concern  . 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 aspirin 81 MG EC tablet Take 81 mg by mouth daily.        . cetirizine-pseudoephedrine (ZYRTEC-D) 5-120 MG per tablet Take 1 tablet by mouth 2 (two) times daily.        Marland Kitchen diltiazem (CARDIZEM CD) 360 MG 24 hr capsule Take 1 capsule (360 mg total) by mouth daily.  30 capsule  11  . diltiazem (TIAZAC) 240 MG 24 hr capsule Take 1 capsule (240 mg total) by mouth daily.  30 capsule  11  . lansoprazole (PREVACID) 30 MG capsule TAKE ONE CAPSULE BY MOUTH DAILY  30 capsule   1  . levothyroxine (SYNTHROID, LEVOTHROID) 25 MCG tablet TAKE 1 TABLET BY MOUTH DAILY  30 tablet  2  . oxyCODONE-acetaminophen (PERCOCET) 5-325 MG per tablet Take 1-2 tablets by mouth every 4 (four) hours as needed.  30 tablet  0  . pantoprazole (PROTONIX) 40 MG tablet Take 1 tablet (40 mg total) by mouth daily.  90 tablet  3  . predniSONE (DELTASONE) 20 MG tablet Take 4 tablets by mouth on Monday, Tuesday, and Wednesday daily  100 tablet  0  . sertraline (ZOLOFT) 50 MG tablet Take 1 tablet (50 mg total) by mouth daily.  30 tablet  6  . VYTORIN 10-80 MG per tablet TAKE ONE TABLET BY MOUTH DAILY  30 tablet  11    No Known Allergies  Family History  Problem Relation Age of Onset  . Diabetes Mother   . Hyperlipidemia Mother   . Hypertension Mother   . Hypertension Father   . Heart disease Father     CAD/CHF  . Cancer Father     Mesothelioma  . Heart disease Maternal Aunt   . Prostate cancer Neg Hx   . Colon cancer Neg Hx  BP 130/92  Pulse 91  Temp(Src) 97.9 F (36.6 C) (Oral)  Wt 237 lb (107.502 kg)  SpO2 96%    Review of Systems Denies fever, but he has night sweats.    Objective:   Physical Exam VITAL SIGNS:  See vs page GENERAL: no distress head: no deformity eyes: no periorbital swelling, no proptosis external nose and ears are normal mouth: no lesion seen Left tm is red.  Right tm is normal LUNGS:  Clear to auscultation   CXR: NAD    Assessment & Plan:  Acute bronchitis, new

## 2011-07-10 NOTE — Patient Instructions (Addendum)
here is a sample of "advair-250."  take 1 puff 2x a day.  rinse mouth after using. Here are 2 prescriptions: antibiotic and cough syrup. Loratadine-d (non-prescription) will help your congestion. A chest-x-ray is requested for you today.  You will receive a letter with results. I hope you feel better soon.  If you don't feel better by next week, please call dr Debby Bud. (see letter)

## 2011-07-11 ENCOUNTER — Encounter: Payer: Self-pay | Admitting: Endocrinology

## 2011-07-11 ENCOUNTER — Telehealth: Payer: Self-pay | Admitting: *Deleted

## 2011-07-11 NOTE — Telephone Encounter (Signed)
Pt informed of chest xray results-also letter mailed to pt.

## 2011-07-17 ENCOUNTER — Encounter: Payer: Self-pay | Admitting: Endocrinology

## 2011-07-17 ENCOUNTER — Ambulatory Visit (INDEPENDENT_AMBULATORY_CARE_PROVIDER_SITE_OTHER): Payer: Commercial Managed Care - PPO | Admitting: Endocrinology

## 2011-07-17 VITALS — BP 142/82 | HR 106 | Temp 98.2°F | Wt 238.0 lb

## 2011-07-17 DIAGNOSIS — R062 Wheezing: Secondary | ICD-10-CM

## 2011-07-17 MED ORDER — AZITHROMYCIN 500 MG PO TABS: 500.0000 mg | ORAL_TABLET | Freq: Every day | ORAL | Status: AC

## 2011-07-17 NOTE — Patient Instructions (Addendum)
here is a sample of "advair-100."  take 1 puff 2x a day.  rinse mouth after using. i have sent a prescription to your pharmacy, for a different antibiotic. The prednisone will also help your wheezing.   I hope you feel better soon.  If you don't feel better by next week, please call back.

## 2011-07-17 NOTE — Progress Notes (Signed)
Subjective:    Patient ID: [name], male    DOB: 03-Feb-1961, 51 y.o.   MRN: [id number]  HPI Pt was seen here 1 week ago for acute bronchitis.  Since then, he has moderate prod-quality cough in the chest, and assoc wheezing.  Overall, he says he feels about the same since last ov here 1 week ago.   Past Medical History  Diagnosis Date  . Hypertension   . Unspecified intestinal obstruction   . Unspecified asthma   . Chest pain, atypical   . Pulmonary nodule   . Nontoxic multinodular goiter   . Painful respiration   . Dyspnea on exertion   . Trigeminal neuralgia   . Cluster headache   . Hyperlipidemia   . GERD (gastroesophageal reflux disease)     Past Surgical History  Procedure Date  . Appendectomy   . Knee arthroscopy   . 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 Right shoulder redo-reconstruction 06/21/10    History   Social History  . Marital Status: Married    Spouse Name: N/A    Number of Children: N/A  . 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 Smoking status: Current Everyday Smoker  . Smokeless tobacco: Not on file   Comment: 1-2 cigs per day, started age 76  . Alcohol Use: Yes  . Drug Use: No  . Sexually Active: Not on file   Other Topics Concern  . 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 amitriptyline (ELAVIL) 25 MG tablet Take 50 mg by mouth daily.      Marland Kitchen aspirin 81 MG EC tablet Take 81 mg by mouth daily.        . cetirizine-pseudoephedrine (ZYRTEC-D) 5-120 MG per tablet Take 1 tablet by mouth 2 (two) times daily.        Marland Kitchen diltiazem (CARDIZEM CD) 360 MG 24 hr capsule Take 1 capsule (360 mg total) by mouth daily.  30 capsule  11  .  diltiazem (TIAZAC) 240 MG 24 hr capsule Take 1 capsule (240 mg total) by mouth daily.  30 capsule  11  . lansoprazole (PREVACID) 30 MG capsule TAKE ONE CAPSULE BY MOUTH DAILY  30 capsule  1  . levothyroxine (SYNTHROID, LEVOTHROID) 25 MCG tablet TAKE 1 TABLET BY MOUTH DAILY  30 tablet  2  . pantoprazole (PROTONIX) 40 MG tablet Take 1 tablet (40 mg total) by mouth daily.  90 tablet  3  . predniSONE (DELTASONE) 20 MG tablet Take 4 tablets by mouth on Monday, Tuesday, and Wednesday daily  100 tablet  0  . VYTORIN 10-80 MG per tablet TAKE ONE TABLET BY MOUTH DAILY  30 tablet  11  . oxyCODONE-acetaminophen (PERCOCET) 5-325 MG per tablet Take 1-2 tablets by mouth every 4 (four) hours as needed.  30 tablet  0  . sertraline (ZOLOFT) 50 MG tablet Take 1 tablet (50 mg total) by mouth daily.  30 tablet  6    No Known Allergies  Family History  Problem Relation Age of Onset  . Diabetes Mother   . Hyperlipidemia Mother   . Hypertension Mother   . Hypertension Father   .  Heart disease Father     CAD/CHF  . Cancer Father     Mesothelioma  . Heart disease Maternal Aunt   . Prostate cancer Neg Hx   . Colon cancer Neg Hx     BP 142/82  Pulse 106  Temp(Src) 98.2 F (36.8 C) (Oral)  Wt 238 lb (107.956 kg)  SpO2 95%    Review of Systems Denies fever.  He has sore throat and left otalgia.    Objective:   Physical Exam VITAL SIGNS:  See vs page GENERAL: no distress head: no deformity eyes: no periorbital swelling, no proptosis external nose and ears are normal mouth: no lesion seen Both tm's are red:  Left >> right LUNGS:  Clear to auscultation        Assessment & Plan:  Cough persistent, with wheezing

## 2011-07-19 ENCOUNTER — Other Ambulatory Visit: Payer: Self-pay

## 2011-07-19 MED ORDER — PROMETHAZINE-CODEINE 6.25-10 MG/5ML PO SYRP: 5.0000 mL | ORAL_SOLUTION | ORAL | Status: AC | PRN

## 2011-07-19 NOTE — Telephone Encounter (Signed)
Pt is not having SOB or fever. Rx faxed to [facility].

## 2011-07-19 NOTE — Telephone Encounter (Signed)
Please verify no fever or sob i printed refill

## 2011-08-06 ENCOUNTER — Other Ambulatory Visit: Payer: Self-pay | Admitting: Internal Medicine

## 2011-11-06 ENCOUNTER — Other Ambulatory Visit: Payer: Self-pay | Admitting: Internal Medicine

## 2011-11-13 ENCOUNTER — Ambulatory Visit (INDEPENDENT_AMBULATORY_CARE_PROVIDER_SITE_OTHER)
Admission: RE | Admit: 2011-11-13 | Discharge: 2011-11-13 | Disposition: A | Discharge status: Home / Self Care | Payer: Commercial Managed Care - PPO | Source: Ambulatory Visit | Attending: Internal Medicine | Admitting: Internal Medicine

## 2011-11-13 ENCOUNTER — Ambulatory Visit (INDEPENDENT_AMBULATORY_CARE_PROVIDER_SITE_OTHER): Payer: Commercial Managed Care - PPO | Admitting: Internal Medicine

## 2011-11-13 ENCOUNTER — Encounter: Payer: Self-pay | Admitting: Internal Medicine

## 2011-11-13 VITALS — BP 146/100 | HR 80 | Temp 97.2°F | Resp 16 | Wt 234.0 lb

## 2011-11-13 DIAGNOSIS — M79641 Pain in right hand: Secondary | ICD-10-CM

## 2011-11-13 DIAGNOSIS — M79609 Pain in unspecified limb: Secondary | ICD-10-CM

## 2011-11-13 DIAGNOSIS — K219 Gastro-esophageal reflux disease without esophagitis: Secondary | ICD-10-CM

## 2011-11-13 MED ORDER — SUCRALFATE 1 GM/10ML PO SUSP: 1.0000 g | Freq: Four times a day (QID) | ORAL | Status: DC

## 2011-11-13 NOTE — Patient Instructions (Addendum)
Hand pain - concern that you may have a fracture of the nuckle of the 3rd finger right. Plan - x-ray of the hand with recommendations to follow.  Reflux - sounds severe. No swallowing difficulty. Plan  take either prevacid or protonix once a day in the AM  Start carafate (sucralfate) suspension 10 cc before meals and at bedtime.  Elevate head of the bed on 4-6 inch blocks so the bed is on a slant to let gravity help prevent reflux.     Gastroesophageal Reflux Disease, Adult Gastroesophageal reflux disease (GERD) happens when acid from your stomach flows up into the esophagus. When acid comes in contact with the esophagus, the acid causes soreness (inflammation) in the esophagus. Over time, GERD may create small holes (ulcers) in the lining of the esophagus. CAUSES    Increased body weight. This puts pressure on the stomach, making acid rise from the stomach into the esophagus.   Smoking. This increases acid production in the stomach.   Drinking alcohol. This causes decreased pressure in the lower esophageal sphincter (valve or ring of muscle between the esophagus and stomach), allowing acid from the stomach into the esophagus.   Late evening meals and a full stomach. This increases pressure and acid production in the stomach.   A malformed lower esophageal sphincter.  Sometimes, no cause is found. SYMPTOMS    Burning pain in the lower part of the mid-chest behind the breastbone and in the mid-stomach area. This may occur twice a week or more often.   Trouble swallowing.   Sore throat.   Dry cough.   Asthma-like symptoms including chest tightness, shortness of breath, or wheezing.  DIAGNOSIS   Your caregiver may be able to diagnose GERD based on your symptoms. In some cases, X-rays and other tests may be done to check for complications or to check the condition of your stomach and esophagus. TREATMENT   Your caregiver may recommend over-the-counter or prescription medicines to  help decrease acid production. Ask your caregiver before starting or adding any new medicines.   HOME CARE INSTRUCTIONS    Change the factors that you can control. Ask your caregiver for guidance concerning weight loss, quitting smoking, and alcohol consumption.   Avoid foods and drinks that make your symptoms worse, such as:   Caffeine or alcoholic drinks.   Chocolate.   Peppermint or mint flavorings.   Garlic and onions.   Spicy foods.   Citrus fruits, such as oranges, lemons, or limes.   Tomato-based foods such as sauce, chili, salsa, and pizza.   Fried and fatty foods.   Avoid lying down for the 3 hours prior to your bedtime or prior to taking a nap.   Eat small, frequent meals instead of large meals.   Wear loose-fitting clothing. Do not wear anything tight around your waist that causes pressure on your stomach.   Raise the head of your bed 6 to 8 inches with wood blocks to help you sleep. Extra pillows will not help.   Only take over-the-counter or prescription medicines for pain, discomfort, or fever as directed by your caregiver.   Do not take aspirin, ibuprofen, or other nonsteroidal anti-inflammatory drugs (NSAIDs).  SEEK IMMEDIATE MEDICAL CARE IF:    You have pain in your arms, neck, jaw, teeth, or back.   Your pain increases or changes in intensity or duration.   You develop nausea, vomiting, or sweating (diaphoresis).   You develop shortness of breath, or you faint.   Your  vomit is green, yellow, black, or looks like coffee grounds or blood.   Your stool is red, bloody, or black.  These symptoms could be signs of other problems, such as heart disease, gastric bleeding, or esophageal bleeding. MAKE SURE YOU:    Understand these instructions.   Will watch your condition.   Will get help right away if you are not doing well or get worse.  Document Released: 01/18/2005 Document Revised: 03/30/2011 Document Reviewed: 10/28/2010 [facility] Patient  Information 2012 [place], [place].

## 2011-11-13 NOTE — Progress Notes (Signed)
Subjective:    Patient ID: [name], male    DOB: [birth year], 51 y.o.   MRN: [id number]  HPI [name] was pulling up a shrub and the back of his right hand slammed into the wall. He had pain and swelling of the dorsum of the right hand. The swelling did resolve but than recurred. He is now having a lot of pain at the 3rd MCP joint. He can make a fist but it hurts to work the hand.  He has had really bad GERD - he is taking both protonix and prevacid; he is awakened at night by the reflux. No dysphagia but he will need to eat slowly. No hematemesis or hematochezia.  Past Medical History  Diagnosis Date  . Hypertension   . Unspecified intestinal obstruction   . Unspecified asthma   . Chest pain, atypical   . Pulmonary nodule   . Nontoxic multinodular goiter   . Painful respiration   . Dyspnea on exertion   . Trigeminal neuralgia   . Cluster headache   . Hyperlipidemia   . GERD (gastroesophageal reflux disease)    Past Surgical History  Procedure Date  . Appendectomy   . Knee arthroscopy   . 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 Right shoulder redo-reconstruction 06/21/10   Family History  Problem Relation Age of Onset  . Diabetes Mother   . Hyperlipidemia Mother   . Hypertension Mother   . Hypertension Father   . Heart disease Father     CAD/CHF  . Cancer Father     Mesothelioma  . Heart disease Maternal Aunt   . Prostate cancer Neg Hx   . Colon cancer Neg Hx    History   Social History  . Marital Status: Married    Spouse Name: N/A    Number of Children: N/A  . 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 Smoking status: Current Everyday Smoker  . Smokeless tobacco: Not on file   Comment: 1-2 cigs per day, started age 38  . Alcohol Use: Yes  . Drug Use: No  . Sexually Active: Not on file   Other Topics Concern  . Not on file   Social History  Narrative   [place] 5 years divorced; remarried '961 son - '85Regular Exercise -  NO    Current Outpatient Prescriptions on File Prior to Visit  Medication Sig Dispense Refill  . amitriptyline (ELAVIL) 25 MG tablet Take 50 mg by mouth daily.      Marland Kitchen aspirin 81 MG EC tablet Take 81 mg by mouth daily.        . cetirizine-pseudoephedrine (ZYRTEC-D) 5-120 MG per tablet Take 1 tablet by mouth 2 (two) times daily.        Marland Kitchen diltiazem (CARDIZEM CD) 360 MG 24 hr capsule Take 1 capsule (360 mg total) by mouth daily.  30 capsule  11  . lansoprazole (PREVACID) 30 MG capsule TAKE ONE CAPSULE BY MOUTH DAILY  30 capsule  5  . levothyroxine (SYNTHROID, LEVOTHROID) 25 MCG tablet TAKE 1 TABLET BY MOUTH DAILY  30 tablet  2  . pantoprazole (PROTONIX) 40 MG tablet Take 1 tablet (40 mg total) by mouth daily.  90 tablet  3  . VYTORIN 10-80 MG per tablet TAKE ONE TABLET BY MOUTH DAILY  30 tablet  11  .  diltiazem (TIAZAC) 240 MG 24 hr capsule Take 1 capsule (240 mg total) by mouth daily.  30 capsule  11  . oxyCODONE-acetaminophen (PERCOCET) 5-325 MG per tablet Take 1-2 tablets by mouth every 4 (four) hours as needed.  30 tablet  0  . predniSONE (DELTASONE) 20 MG tablet Take 4 tablets by mouth on Monday, Tuesday, and Wednesday daily  100 tablet  0  . sertraline (ZOLOFT) 50 MG tablet Take 1 tablet (50 mg total) by mouth daily.  30 tablet  6  . sucralfate (CARAFATE) 1 GM/10ML suspension Take 10 mLs (1 g total) by mouth 4 (four) times daily.  420 mL  11      Review of Systems System review is negative for any constitutional, cardiac, pulmonary, GI or neuro symptoms or complaints other than as described in the HPI.     Objective:   Physical Exam Filed Vitals:   11/13/11 1613  BP: 146/100  Pulse: 80  Temp: 97.2 F (36.2 C)  Resp: 16   Gen'l- heavyset AA man in no distress MSK - right hand w/o swelling, able to make a fist, grip strength 4/5, very tender to palpation of  3rd MCP but no deformity Pulm - normal respirations. Neuro - A&O x 3  X-ray right hand: Findings: Slight dorsal soft tissue swelling over the distal  metacarpal regions seen best on the oblique view. No visible  radiopaque foreign body, fracture, or dislocation.  IMPRESSION:  Slight dorsal soft tissue swelling. No acute bony findings       Assessment & Plan:  Hand pain - no fracture or osseous abnormality Plan Tincture of time.

## 2011-11-14 NOTE — Assessment & Plan Note (Signed)
Flare of GERD symptoms w/o signs of bleeding and w/o dysphagia.  Plan Take single PPI agent once in AM  Add Carafate suspension 1 g PO AC/HS  Elevated head of bed on 4-6" blocks

## 2011-11-22 ENCOUNTER — Encounter: Payer: Self-pay | Admitting: Internal Medicine

## 2011-11-22 ENCOUNTER — Telehealth: Payer: Self-pay | Admitting: *Deleted

## 2011-11-22 ENCOUNTER — Ambulatory Visit (INDEPENDENT_AMBULATORY_CARE_PROVIDER_SITE_OTHER): Payer: Commercial Managed Care - PPO | Admitting: Internal Medicine

## 2011-11-22 VITALS — BP 128/80 | HR 74 | Temp 98.3°F | Resp 16 | Wt 236.0 lb

## 2011-11-22 DIAGNOSIS — M79641 Pain in right hand: Secondary | ICD-10-CM

## 2011-11-22 DIAGNOSIS — M79609 Pain in unspecified limb: Secondary | ICD-10-CM

## 2011-11-22 MED ORDER — OXYCODONE-ACETAMINOPHEN 5-325 MG PO TABS: 1.0000 | ORAL_TABLET | Freq: Three times a day (TID) | ORAL | Status: AC | PRN

## 2011-11-22 NOTE — Telephone Encounter (Signed)
Message copied by Elnora Morrison on Wed Nov 22, 2011  9:53 AM ------      Message from: Jacques Navy      Created: Tue Nov 14, 2011  5:54 AM       Please call patient: x-ray hand with no fracture. Pain should get better with time.

## 2011-11-22 NOTE — Telephone Encounter (Signed)
Message copied by Elnora Morrison on Wed Nov 22, 2011  9:59 AM ------      Message from: Jacques Navy      Created: Tue Nov 14, 2011  5:54 AM       Please call patient: x-ray hand with no fracture. Pain should get better with time.

## 2011-11-22 NOTE — Patient Instructions (Addendum)
Hand pain - tendon injury vs occult fracture. Plan - to see Dr. Williams Che, orthopedics/hand specialist, Thursday, August 1st at 08:10 for an 08:40 appointment           [facility]             [address]           [phone]            Take percocet every 8 hours as needed for pain

## 2011-11-22 NOTE — Progress Notes (Signed)
Subjective:    Patient ID: [name], male    DOB: Jul 26, 1960, 51 y.o.   MRN: [id number]  HPI [name] was seen July 22nd for hand pain. He had struck his hand against cement board when he was pulling up shrubbery and lost his grip. He had immediate pain and swelling about the MCP joints 2nd and 3rd digit. He had swelling and well as limitation in function/grip. Plain x-ray revealed no fracture but there was soft tissue swelling. A week of conservative therapy with NSAIDs and heat has not helped. 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 He is also having swelling of hte 4th digit right.  Past Medical History  Diagnosis Date  . Hypertension   . Unspecified intestinal obstruction   . Unspecified asthma   . Chest pain, atypical   . Pulmonary nodule   . Nontoxic multinodular goiter   . Painful respiration   . Dyspnea on exertion   . Trigeminal neuralgia   . Cluster headache   . Hyperlipidemia   . GERD (gastroesophageal reflux disease)    Past Surgical History  Procedure Date  . Appendectomy   . Knee arthroscopy   . 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 Right shoulder redo-reconstruction 06/21/10   Family History  Problem Relation Age of Onset  . Diabetes Mother   . Hyperlipidemia Mother   . Hypertension Mother   . Hypertension Father   . Heart disease Father     CAD/CHF  . Cancer Father     Mesothelioma  . Heart disease Maternal Aunt   . Prostate cancer Neg Hx   . Colon cancer Neg Hx    History   Social History  . Marital Status: Married    Spouse Name: N/A    Number of Children: N/A  . 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 Smoking status: Current Everyday Smoker  . Smokeless tobacco: Not on file   Comment: 1-2 cigs per day, started age 71  . Alcohol Use: Yes  . Drug Use: No  . Sexually Active: Not on file   Other Topics  Concern  . 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 amitriptyline (ELAVIL) 25 MG tablet Take 50 mg by mouth daily.      . cetirizine-pseudoephedrine (ZYRTEC-D) 5-120 MG per tablet Take 1 tablet by mouth 2 (two) times daily.        Marland Kitchen diltiazem (CARDIZEM CD) 360 MG 24 hr capsule Take 1 capsule (360 mg total) by mouth daily.  30 capsule  11  . diltiazem (TIAZAC) 240 MG 24 hr capsule Take 1 capsule (240 mg total) by mouth daily.  30 capsule  11  . lansoprazole (PREVACID) 30 MG capsule TAKE ONE CAPSULE BY MOUTH DAILY  30 capsule  5  . levothyroxine (SYNTHROID, LEVOTHROID) 25 MCG tablet TAKE 1 TABLET BY MOUTH DAILY  30 tablet  2  . sertraline (ZOLOFT) 50 MG tablet Take 1 tablet (50 mg total) by mouth daily.  30 tablet  6  . sucralfate (CARAFATE) 1 GM/10ML suspension Take 10 mLs (1 g total) by mouth 4 (  four) times daily.  420 mL  11  . VYTORIN 10-80 MG per tablet TAKE ONE TABLET BY MOUTH DAILY  30 tablet  11  . aspirin 81 MG EC tablet Take 81 mg by mouth daily.        . predniSONE (DELTASONE) 20 MG tablet Take 4 tablets by mouth on Monday, Tuesday, and Wednesday daily  100 tablet  0      Review of Systems System review is negative for any constitutional, cardiac, pulmonary, GI or neuro symptoms or complaints other than as described in the HPI.     Objective:   Physical Exam Filed Vitals:   11/22/11 1603  BP: 128/80  Pulse: 74  Temp: 98.3 F (36.8 C)  Resp: 16   Pulm- normal respirations MSK- right hand with mild swelling of the proximal phalanx, tenderness over the MCP joints, decreased grip strength due to pain. Sensation is preserved. Circulation is normal.       Assessment & Plan:  Hand pain right - post traumatic  Plan - referral to Dr. Mina Marble for Thrusday, August 1 at  0800 hours. Patient is aware of appointment.            Rx percocet 5/325 q 8 for pain.

## 2011-11-22 NOTE — Telephone Encounter (Signed)
Patient states hand is not better. Swollen this am.  Informed of xray. Transferred to scheduler to follow up with Dr, Debby Bud

## 2011-11-30 ENCOUNTER — Encounter (HOSPITAL_BASED_OUTPATIENT_CLINIC_OR_DEPARTMENT_OTHER): Payer: Self-pay | Admitting: Emergency Medicine

## 2011-11-30 ENCOUNTER — Inpatient Hospital Stay (HOSPITAL_COMMUNITY): Payer: Commercial Managed Care - PPO

## 2011-11-30 ENCOUNTER — Inpatient Hospital Stay (HOSPITAL_BASED_OUTPATIENT_CLINIC_OR_DEPARTMENT_OTHER)
Admission: EM | Admit: 2011-11-30 | Discharge: 2011-12-04 | DRG: 372 | Disposition: A | Discharge status: Home / Self Care | Payer: Commercial Managed Care - PPO | Attending: Internal Medicine | Admitting: Internal Medicine

## 2011-11-30 DIAGNOSIS — Z9089 Acquired absence of other organs: Secondary | ICD-10-CM

## 2011-11-30 DIAGNOSIS — G500 Trigeminal neuralgia: Secondary | ICD-10-CM | POA: Diagnosis present

## 2011-11-30 DIAGNOSIS — Z8249 Family history of ischemic heart disease and other diseases of the circulatory system: Secondary | ICD-10-CM

## 2011-11-30 DIAGNOSIS — Z833 Family history of diabetes mellitus: Secondary | ICD-10-CM

## 2011-11-30 DIAGNOSIS — E860 Dehydration: Secondary | ICD-10-CM

## 2011-11-30 DIAGNOSIS — F17200 Nicotine dependence, unspecified, uncomplicated: Secondary | ICD-10-CM | POA: Diagnosis present

## 2011-11-30 DIAGNOSIS — R112 Nausea with vomiting, unspecified: Secondary | ICD-10-CM

## 2011-11-30 DIAGNOSIS — N179 Acute kidney failure, unspecified: Secondary | ICD-10-CM

## 2011-11-30 DIAGNOSIS — K219 Gastro-esophageal reflux disease without esophagitis: Secondary | ICD-10-CM | POA: Diagnosis present

## 2011-11-30 DIAGNOSIS — N289 Disorder of kidney and ureter, unspecified: Secondary | ICD-10-CM

## 2011-11-30 DIAGNOSIS — I10 Essential (primary) hypertension: Secondary | ICD-10-CM

## 2011-11-30 DIAGNOSIS — K5289 Other specified noninfective gastroenteritis and colitis: Secondary | ICD-10-CM

## 2011-11-30 DIAGNOSIS — A050 Foodborne staphylococcal intoxication: Principal | ICD-10-CM | POA: Diagnosis present

## 2011-11-30 DIAGNOSIS — Z720 Tobacco use: Secondary | ICD-10-CM | POA: Diagnosis present

## 2011-11-30 DIAGNOSIS — R197 Diarrhea, unspecified: Secondary | ICD-10-CM

## 2011-11-30 DIAGNOSIS — E785 Hyperlipidemia, unspecified: Secondary | ICD-10-CM

## 2011-11-30 DIAGNOSIS — R1110 Vomiting, unspecified: Secondary | ICD-10-CM

## 2011-11-30 HISTORY — DX: Other complications of anesthesia, initial encounter: T88.59XA

## 2011-11-30 HISTORY — DX: Adverse effect of unspecified anesthetic, initial encounter: T41.45XA

## 2011-11-30 LAB — URINALYSIS, ROUTINE W REFLEX MICROSCOPIC
Bilirubin Urine: NEGATIVE
Glucose, UA: NEGATIVE mg/dL
Hgb urine dipstick: NEGATIVE
Ketones, ur: 15 mg/dL — AB
Leukocytes, UA: NEGATIVE
Nitrite: NEGATIVE
Protein, ur: NEGATIVE mg/dL
Specific Gravity, Urine: 1.021 (ref 1.005–1.030)
Urobilinogen, UA: 0.2 mg/dL (ref 0.0–1.0)
pH: 5.5 (ref 5.0–8.0)

## 2011-11-30 LAB — CBC WITH DIFFERENTIAL/PLATELET
Basophils Absolute: 0 10*3/uL (ref 0.0–0.1)
Basophils Relative: 0 % (ref 0–1)
Eosinophils Absolute: 0 10*3/uL (ref 0.0–0.7)
Eosinophils Relative: 0 % (ref 0–5)
HCT: 50.3 % (ref 39.0–52.0)
Hemoglobin: 18.5 g/dL — ABNORMAL HIGH (ref 13.0–17.0)
Lymphocytes Relative: 19 % (ref 12–46)
Lymphs Abs: 3 10*3/uL (ref 0.7–4.0)
MCH: 30.6 pg (ref 26.0–34.0)
MCHC: 36.8 g/dL — ABNORMAL HIGH (ref 30.0–36.0)
MCV: 83.1 fL (ref 78.0–100.0)
Monocytes Absolute: 0.7 10*3/uL (ref 0.1–1.0)
Monocytes Relative: 5 % (ref 3–12)
Neutro Abs: 12.3 10*3/uL — ABNORMAL HIGH (ref 1.7–7.7)
Neutrophils Relative %: 77 % (ref 43–77)
Platelets: 219 10*3/uL (ref 150–400)
RBC: 6.05 MIL/uL — ABNORMAL HIGH (ref 4.22–5.81)
RDW: 13.1 % (ref 11.5–15.5)
WBC: 16 10*3/uL — ABNORMAL HIGH (ref 4.0–10.5)

## 2011-11-30 LAB — BASIC METABOLIC PANEL
BUN: 21 mg/dL (ref 6–23)
BUN: 18 mg/dL (ref 6–23)
CO2: 13 mEq/L — ABNORMAL LOW (ref 19–32)
CO2: 22 mEq/L (ref 19–32)
Calcium: 11.2 mg/dL — ABNORMAL HIGH (ref 8.4–10.5)
Calcium: 9.4 mg/dL (ref 8.4–10.5)
Chloride: 101 mEq/L (ref 96–112)
Chloride: 110 mEq/L (ref 96–112)
Creatinine, Ser: 2.3 mg/dL — ABNORMAL HIGH (ref 0.50–1.35)
Creatinine, Ser: 1.43 mg/dL — ABNORMAL HIGH (ref 0.50–1.35)
GFR calc Af Amer: 37 mL/min — ABNORMAL LOW (ref >90)
GFR calc Af Amer: 66 mL/min — ABNORMAL LOW (ref >90)
GFR calc non Af Amer: 32 mL/min — ABNORMAL LOW (ref >90)
GFR calc non Af Amer: 57 mL/min — ABNORMAL LOW (ref >90)
Glucose, Bld: 170 mg/dL — ABNORMAL HIGH (ref 70–99)
Glucose, Bld: 127 mg/dL — ABNORMAL HIGH (ref 70–99)
Potassium: 3.7 mEq/L (ref 3.5–5.1)
Potassium: 4.3 mEq/L (ref 3.5–5.1)
Sodium: 138 mEq/L (ref 135–145)
Sodium: 145 mEq/L (ref 135–145)

## 2011-11-30 LAB — CBC
HCT: 46.6 % (ref 39.0–52.0)
Hemoglobin: 16.3 g/dL (ref 13.0–17.0)
MCH: 30.9 pg (ref 26.0–34.0)
MCHC: 35 g/dL (ref 30.0–36.0)
MCV: 88.3 fL (ref 78.0–100.0)
Platelets: 172 10*3/uL (ref 150–400)
RBC: 5.28 MIL/uL (ref 4.22–5.81)
RDW: 13.2 % (ref 11.5–15.5)
WBC: 17.1 10*3/uL — ABNORMAL HIGH (ref 4.0–10.5)

## 2011-11-30 LAB — HEPATIC FUNCTION PANEL
ALT: 23 U/L (ref 0–53)
AST: 27 U/L (ref 0–37)
Albumin: 5.5 g/dL — ABNORMAL HIGH (ref 3.5–5.2)
Alkaline Phosphatase: 75 U/L (ref 39–117)
Bilirubin, Direct: 0.1 mg/dL (ref 0.0–0.3)
Indirect Bilirubin: 0.4 mg/dL (ref 0.3–0.9)
Total Bilirubin: 0.5 mg/dL (ref 0.3–1.2)
Total Protein: 9.7 g/dL — ABNORMAL HIGH (ref 6.0–8.3)

## 2011-11-30 MED ORDER — SODIUM CHLORIDE 0.9 % IV SOLN
1000.0000 mL | Freq: Once | INTRAVENOUS | Status: DC
  Administered 2011-12-02: 1000 mL via INTRAVENOUS

## 2011-11-30 MED ORDER — SODIUM CHLORIDE 0.9 % IV SOLN
1000.0000 mL | Freq: Once | INTRAVENOUS | Status: AC
  Administered 2011-11-30: 1000 mL via INTRAVENOUS

## 2011-11-30 MED ORDER — ASPIRIN EC 81 MG PO TBEC
81.0000 mg | DELAYED_RELEASE_TABLET | Freq: Every day | ORAL | Status: DC
  Filled 2011-11-30 (×2): qty 1

## 2011-11-30 MED ORDER — AMITRIPTYLINE HCL 50 MG PO TABS
50.0000 mg | ORAL_TABLET | Freq: Every day | ORAL | Status: DC
  Filled 2011-11-30: qty 1

## 2011-11-30 MED ORDER — PROMETHAZINE HCL 25 MG/ML IJ SOLN
25.0000 mg | Freq: Once | INTRAMUSCULAR | Status: AC
  Administered 2011-11-30: 25 mg via INTRAVENOUS
  Filled 2011-11-30: qty 1

## 2011-11-30 MED ORDER — SODIUM CHLORIDE 0.9 % IV SOLN
1000.0000 mL | INTRAVENOUS | Status: DC
  Administered 2011-12-01: 1000 mL via INTRAVENOUS

## 2011-11-30 MED ORDER — ONDANSETRON HCL 4 MG/2ML IJ SOLN
4.0000 mg | Freq: Once | INTRAMUSCULAR | Status: AC
  Administered 2011-11-30: 4 mg via INTRAVENOUS
  Filled 2011-11-30: qty 2

## 2011-11-30 MED ORDER — ALUM & MAG HYDROXIDE-SIMETH 200-200-20 MG/5ML PO SUSP
30.0000 mL | Freq: Four times a day (QID) | ORAL | Status: DC | PRN
  Filled 2011-11-30: qty 30

## 2011-11-30 MED ORDER — HYDROMORPHONE HCL PF 1 MG/ML IJ SOLN
INTRAMUSCULAR | Status: AC
  Filled 2011-11-30: qty 1

## 2011-11-30 MED ORDER — HYDROMORPHONE HCL PF 1 MG/ML IJ SOLN
1.0000 mg | Freq: Once | INTRAMUSCULAR | Status: AC
  Administered 2011-11-30: 1 mg via INTRAVENOUS

## 2011-11-30 MED ORDER — PANTOPRAZOLE SODIUM 40 MG IV SOLR
40.0000 mg | Freq: Every day | INTRAVENOUS | Status: DC
  Administered 2011-11-30 – 2011-12-03 (×4): 40 mg via INTRAVENOUS
  Filled 2011-11-30 (×5): qty 40

## 2011-11-30 MED ORDER — METRONIDAZOLE IN NACL 5-0.79 MG/ML-% IV SOLN
500.0000 mg | Freq: Three times a day (TID) | INTRAVENOUS | Status: DC
  Administered 2011-11-30 – 2011-12-04 (×11): 500 mg via INTRAVENOUS
  Filled 2011-11-30 (×13): qty 100

## 2011-11-30 MED ORDER — ONDANSETRON HCL 4 MG/2ML IJ SOLN
4.0000 mg | INTRAMUSCULAR | Status: DC | PRN
  Administered 2011-11-30 – 2011-12-01 (×8): 4 mg via INTRAVENOUS
  Filled 2011-11-30 (×9): qty 2

## 2011-11-30 MED ORDER — SODIUM CHLORIDE 0.9 % IV SOLN
INTRAVENOUS | Status: AC
  Administered 2011-11-30: 10:00:00 via INTRAVENOUS

## 2011-11-30 MED ORDER — SERTRALINE HCL 50 MG PO TABS
50.0000 mg | ORAL_TABLET | Freq: Every day | ORAL | Status: DC
  Administered 2011-12-02 – 2011-12-04 (×3): 50 mg via ORAL
  Filled 2011-11-30 (×5): qty 1

## 2011-11-30 MED ORDER — ACETAMINOPHEN 650 MG RE SUPP: 650.0000 mg | Freq: Four times a day (QID) | RECTAL | Status: DC | PRN

## 2011-11-30 MED ORDER — SODIUM CHLORIDE 0.9 % IV SOLN: 1000.0000 mL | INTRAVENOUS | Status: DC

## 2011-11-30 MED ORDER — EZETIMIBE-SIMVASTATIN 10-80 MG PO TABS
1.0000 | ORAL_TABLET | Freq: Every day | ORAL | Status: DC
  Administered 2011-12-02 – 2011-12-03 (×2): 1 via ORAL
  Filled 2011-11-30 (×5): qty 1

## 2011-11-30 MED ORDER — MORPHINE SULFATE 2 MG/ML IJ SOLN
2.0000 mg | INTRAMUSCULAR | Status: DC | PRN
  Administered 2011-11-30 (×3): 2 mg via INTRAVENOUS
  Filled 2011-11-30 (×4): qty 1

## 2011-11-30 MED ORDER — METOCLOPRAMIDE HCL 5 MG/ML IJ SOLN
5.0000 mg | Freq: Four times a day (QID) | INTRAMUSCULAR | Status: DC
  Administered 2011-11-30 – 2011-12-01 (×3): 5 mg via INTRAVENOUS
  Filled 2011-11-30 (×6): qty 1

## 2011-11-30 MED ORDER — PROMETHAZINE HCL 25 MG/ML IJ SOLN
25.0000 mg | Freq: Once | INTRAMUSCULAR | Status: AC
  Administered 2011-11-30: 25 mg via INTRAVENOUS

## 2011-11-30 MED ORDER — CIPROFLOXACIN IN D5W 400 MG/200ML IV SOLN
400.0000 mg | INTRAVENOUS | Status: DC
  Administered 2011-11-30 – 2011-12-03 (×4): 400 mg via INTRAVENOUS
  Filled 2011-11-30 (×5): qty 200

## 2011-11-30 MED ORDER — LEVOTHYROXINE SODIUM 25 MCG PO TABS
25.0000 ug | ORAL_TABLET | Freq: Every day | ORAL | Status: DC
  Administered 2011-12-02 – 2011-12-04 (×3): 25 ug via ORAL
  Filled 2011-11-30 (×5): qty 1

## 2011-11-30 MED ORDER — PROMETHAZINE HCL 25 MG/ML IJ SOLN
INTRAMUSCULAR | Status: AC
  Filled 2011-11-30: qty 1

## 2011-11-30 MED ORDER — ACETAMINOPHEN 325 MG PO TABS: 650.0000 mg | ORAL_TABLET | Freq: Four times a day (QID) | ORAL | Status: DC | PRN

## 2011-11-30 MED ORDER — LORAZEPAM 2 MG/ML IJ SOLN
INTRAMUSCULAR | Status: AC
  Filled 2011-11-30: qty 1

## 2011-11-30 MED ORDER — LORAZEPAM 2 MG/ML IJ SOLN: 2.0000 mg | Freq: Once | INTRAMUSCULAR | Status: AC

## 2011-11-30 MED ORDER — AMITRIPTYLINE HCL 50 MG PO TABS
50.0000 mg | ORAL_TABLET | Freq: Every day | ORAL | Status: DC
  Administered 2011-12-02 – 2011-12-03 (×2): 50 mg via ORAL
  Filled 2011-11-30 (×5): qty 1

## 2011-11-30 MED ORDER — ASPIRIN 81 MG PO TBEC: 81.0000 mg | DELAYED_RELEASE_TABLET | Freq: Every day | ORAL | Status: DC

## 2011-11-30 MED ORDER — DIPHENHYDRAMINE HCL 25 MG PO CAPS
25.0000 mg | ORAL_CAPSULE | Freq: Four times a day (QID) | ORAL | Status: DC | PRN
  Administered 2011-11-30: 25 mg via ORAL
  Filled 2011-11-30: qty 1

## 2011-11-30 MED ORDER — HYDROMORPHONE HCL PF 1 MG/ML IJ SOLN
INTRAMUSCULAR | Status: AC
Start: 2011-11-30 — End: 2011-11-30
  Filled 2011-11-30: qty 1

## 2011-11-30 NOTE — ED Notes (Signed)
Pt reports vomiting and diarrhea x 2 days after eating at a "food show"

## 2011-11-30 NOTE — ED Notes (Signed)
Patient reports nausea and emesis X1; noted.

## 2011-11-30 NOTE — H&P (Signed)
[facility] History and Physical  [name] ZOX:[id number] DOB: [birth year] DOA: 11/30/2011  Referring physician: Eber [name], emergency room physician PCP: Jeffrey Regulus, MD   Chief Complaint: Nausea vomiting and diarrhea  HPI:  Patient is a 51 year old Afro-American male past history of hypertension who for the last 2 days has had uncontrolled nausea, vomiting and diarrhea which prevented him from sleeping or keeping anything else down. It got to the point where he could not take it anymore and came into the emergency room at [facility] today. There his son have an elevated white blood cell count 16, signs of significant dehydration as well as acute renal failure. Hospitals were called for evaluation patient was transferred over to [facility].  Review of Systems:  I saw the patient after he arrived to the floor, he was feeling rough. He denied any headaches, vision changes or dysphagia. He said he felt very very tired and continued episodes of nausea and vomiting and diarrhea. Denied any chest pain or shortness of breath. Complaint some severe acid reflux but no will significant abdominal pain. Denied any hematuria or dysuria constipation. No focal extremity numbness weakness or pain. Review systems otherwise negative.  Past Medical History  Diagnosis Date  . Hypertension   . Unspecified intestinal obstruction   . Unspecified asthma   . Chest pain, atypical   . Pulmonary nodule   . Nontoxic multinodular goiter   . Painful respiration   . Dyspnea on exertion   . Trigeminal neuralgia   . Cluster headache   . Hyperlipidemia   . GERD (gastroesophageal reflux disease)    Past Surgical History  Procedure Date  . Appendectomy   . Knee arthroscopy   . 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 Right shoulder redo-reconstruction 06/21/10   Social History:  reports that he has been smoking.  He does not have any smokeless tobacco  history on file. He reports that he drinks alcohol. He reports that he does not use illicit drugs. Patient was at home with his wife. Normally is able to participate in most activities of daily living without assistance  No Known Allergies  Family History  Problem Relation Age of Onset  . Diabetes Mother   . Hyperlipidemia Mother   . Hypertension Mother   . Hypertension Father   . Heart disease Father     CAD/CHF  . Cancer Father     Mesothelioma  . Heart disease Maternal Aunt   . Prostate cancer Neg Hx   . Colon cancer Neg Hx     Prior to Admission medications   Medication Sig Start Date End Date Taking? Authorizing Provider  amitriptyline (ELAVIL) 25 MG tablet Take 50 mg by mouth daily.   Yes Historical Provider, MD  aspirin 81 MG EC tablet Take 81 mg by mouth daily.     Yes Historical Provider, MD  cetirizine-pseudoephedrine (ZYRTEC-D) 5-120 MG per tablet Take 1 tablet by mouth 2 (two) times daily.     Yes Historical Provider, MD  diltiazem (TIAZAC) 240 MG 24 hr capsule Take 1 capsule (240 mg total) by mouth daily. 10/03/10  Yes Jeffrey Navy, MD  lansoprazole (PREVACID) 30 MG capsule TAKE ONE CAPSULE BY MOUTH DAILY 11/06/11  Yes Jeffrey Navy, MD  levothyroxine (SYNTHROID, LEVOTHROID) 25 MCG tablet TAKE 1 TABLET BY MOUTH DAILY 08/06/11  Yes Jeffrey Navy, MD  oxyCODONE-acetaminophen (ROXICET) 5-325 MG per tablet Take 1 tablet by mouth  every 8 (eight) hours as needed for pain. 11/22/11 12/02/11 Yes Jeffrey Navy, MD  sertraline (ZOLOFT) 50 MG tablet Take 1 tablet (50 mg total) by mouth daily. 12/23/10 12/23/11 Yes Jeffrey Navy, MD  VYTORIN 10-80 MG per tablet TAKE ONE TABLET BY MOUTH DAILY 07/02/11  Yes Jeffrey Navy, MD   Physical Exam: Filed Vitals:   11/30/11 0744 11/30/11 0837 11/30/11 1020 11/30/11 1105  BP: 150/90 148/87 155/100 156/103  Pulse: 66 78 78 66  Temp:  98.7 F (37.1 C) 97.6 F (36.4 C)   TempSrc:      Resp: 18 20 18    SpO2: 100% 99% 100%       General:  Alert and oriented x3, fatigue, looks about stated age, mild distress secondary to vomiting and diarrhea  Eyes: Sclera nonicteric, extraocular muscles are intact  ENT: Normocephalic, atraumatic, mucous membranes are dry and  Neck: Supple no JVD  Cardiovascular: Regular rate and rhythm, S1-S2  Respiratory: Clear to auscultation bilaterally  Abdomen: Soft, mild distention, hypoactive bowel sounds, mild nonspecific tenderness  Skin: No lesions or rashes  Musculoskeletal: No clubbing or cyanosis or edema  Psychiatric: Appropriate, no evidence of acute psychoses  Neurologic: No focal deficits  Labs on Admission:  Basic Metabolic Panel:  Lab 11/30/11 6578  NA 138  K 3.7  CL 101  CO2 13*  GLUCOSE 170*  BUN 21  CREATININE 2.30*  CALCIUM 11.2*  MG --  PHOS --   Liver Function Tests:  Lab 11/30/11 0743  AST 27  ALT 23  ALKPHOS 75  BILITOT 0.5  PROT 9.7*  ALBUMIN 5.5*   CBC:  Lab 11/30/11 0601  WBC 16.0*  NEUTROABS 12.3*  HGB 18.5*  HCT 50.3  MCV 83.1  PLT 219    BNP (last 3 results)  Basename 12/21/10 0032  PROBNP 26.2      Assessment/Plan Principal Problem:  *Diarrhea: Most likely gastroenteritis versus food poisoning. Have ordered stool cultures. Will repeat blood work which will be approximately 8-9 hours since his blood work was done in the emergency room. If the blood cell count remains elevated, will start Cipro and Flagyl IV. No evidence of C. difficile given the fact has not been on any antibiotics in the last 6 months.  Active Problems:  HYPERLIPIDEMIA: Stable. Continue Vytorin   HYPERTENSION: Stable. Holding antihypertensives until he is better hydrated.   GERD: IV PPI.   ARF (acute renal failure): Secondary to dehydration from nausea vomiting and diarrhea. Hydrating. Repeat labs.   Nausea and vomiting: Secondary to enteritis versus food poisoning. Symptomatic control.   Tobacco abuse: Patient counseled. Declined  nicotine patch.   Code Status: Full code Family Communication: Discussed plan with patient Disposition Plan: A flu home in one to 2 days once this is resolved  Time spent: 35 minutes  [name] [facility] Pager [phone]  If 7PM-7AM, please contact night-coverage [email or web address] Password [email or web address] 11/30/2011, 2:45 PM      I and urine and he definitely

## 2011-11-30 NOTE — ED Provider Notes (Signed)
History     CSN: [id number]  Arrival date & time 11/30/11  0554   First MD Initiated Contact with Patient 11/30/11 0559      Chief Complaint  Patient presents with  . Emesis  . 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  He became ill and states that he had "violent diarrhea" which has persistent numerous times which was grey / yellow watery and non bloody diarrhea.  He has since developed intractable nausea and vomiting associated with the diarrhea.  He denies fevers, chills, cough, sob, back pain, swelling or rashes.  He does have abdominal cramping as well.  The cramps are 8/10 in intensity.  He has tried pepto-bismol prior to arrival without improvement as well as imodium yesterday with no improvement.  He denies exotic travel or recent antibiotic use.   The sx are persistent, nothing seems to make better or worse, they are severe.  Patient is a 51 y.o. male presenting with vomiting and diarrhea. The history is provided by the patient and the spouse.  Emesis  Associated symptoms include diarrhea.  Diarrhea The primary symptoms include vomiting and diarrhea.    Past Medical History  Diagnosis Date  . Hypertension   . Unspecified intestinal obstruction   . Unspecified asthma   . Chest pain, atypical   . Pulmonary nodule   . Nontoxic multinodular goiter   . Painful respiration   . Dyspnea on exertion   . Trigeminal neuralgia   . Cluster headache   . Hyperlipidemia   . GERD (gastroesophageal reflux disease)     Past Surgical History  Procedure Date  . Appendectomy   . Knee arthroscopy   . 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 Right shoulder redo-reconstruction 06/21/10    Family History  Problem Relation Age of Onset    . Diabetes Mother   . Hyperlipidemia Mother   . Hypertension Mother   . Hypertension Father   . Heart disease Father     CAD/CHF  . Cancer Father     Mesothelioma  . Heart disease Maternal Aunt   . Prostate cancer Neg Hx   . Colon cancer Neg Hx     History  Substance Use Topics  . Smoking status: Current Everyday Smoker  . Smokeless tobacco: Not on file   Comment: 1-2 cigs per day, started age 40  . Alcohol Use: Yes      Review of Systems  Gastrointestinal: Positive for vomiting and diarrhea.  All other systems reviewed and are negative.    Allergies  Review of patient's allergies indicates no known allergies.  Home Medications   Current Outpatient Rx  Name Route Sig Dispense Refill  . AMITRIPTYLINE HCL 25 MG PO TABS Oral Take 50 mg by mouth daily.    . ASPIRIN 81 MG PO TBEC Oral Take 81 mg by mouth daily.      Marland Kitchen CETIRIZINE-PSEUDOEPHEDRINE ER 5-120 MG PO TB12 Oral Take 1 tablet by mouth 2 (two) times daily.      Marland Kitchen DILTIAZEM HCL ER COATED BEADS 360 MG PO CP24 Oral Take 1 capsule (360 mg total) by mouth daily. 30 capsule 11  . DILTIAZEM HCL ER BEADS 240 MG PO CP24 Oral Take 1 capsule (240 mg total) by mouth  daily. 30 capsule 11  . LANSOPRAZOLE 30 MG PO CPDR  TAKE ONE CAPSULE BY MOUTH DAILY 30 capsule 5  . LEVOTHYROXINE SODIUM 25 MCG PO TABS  TAKE 1 TABLET BY MOUTH DAILY 30 tablet 2    Patient needs to make appointment for labs and off ...  . OXYCODONE-ACETAMINOPHEN 5-325 MG PO TABS Oral Take 1 tablet by mouth every 8 (eight) hours as needed for pain. 20 tablet 0  . PREDNISONE 20 MG PO TABS  Take 4 tablets by mouth on Monday, Tuesday, and Wednesday daily 100 tablet 0  . SERTRALINE HCL 50 MG PO TABS Oral Take 1 tablet (50 mg total) by mouth daily. 30 tablet 6  . SUCRALFATE 1 GM/10ML PO SUSP Oral Take 10 mLs (1 g total) by mouth 4 (four) times daily. 420 mL 11  . VYTORIN 10-80 MG PO TABS  TAKE ONE TABLET BY MOUTH DAILY 30 tablet 11    CYCLE FILL MEDICATION. Authorization  is required f ...    BP 122/87  Pulse 90  Temp 97.8 F (36.6 C) (Oral)  Resp 18  SpO2 98%  Physical Exam  Constitutional:       Uncomfortable and Dehydrated in appearance  HENT:       Normocephalic and atraumatic, mucous membranes are mildly dehydrated, tympanic membranes are clear bilaterally  Eyes:       Conjunctiva are clear, pupils are equal round and reactive to light, no scleral icterus  Neck:       Supple neck, no lymphadenopathy  Cardiovascular:       Mild tachycardia at 105, strong peripheral pulses at the radial arteries, normal capillary refill  Pulmonary/Chest: No stridor.       Lungs are clear bilaterally, no wheezing rales or rhonchi  Abdominal:       Abdomen has mild diffuse crampy tenderness, no guarding or masses, non-peritoneal  Musculoskeletal:       Extremities are without edema or significant tenderness, no deformity  Neurological:       Alert, tired-appearing, follows commands, able to ambulate  Skin:       Warm and dry skin without rashes petechiae or purpura    ED Course  Procedures (including critical care time)  Labs Reviewed  BASIC METABOLIC PANEL - Abnormal; Notable for the following:    CO2 13 (*)     Glucose, Bld 170 (*)     Creatinine, Ser 2.30 (*)     Calcium 11.2 (*)     GFR calc non Af Amer 32 (*)     GFR calc Af Amer 37 (*)     All other components within normal limits  CBC WITH DIFFERENTIAL - Abnormal; Notable for the following:    WBC 16.0 (*)     RBC 6.05 (*)     Hemoglobin 18.5 (*)     MCHC 36.8 (*)     Neutro Abs 12.3 (*)     All other components within normal limits  URINALYSIS, ROUTINE W REFLEX MICROSCOPIC   No results found.   No diagnosis found.    MDM  The patient appears uncomfortable, he has mild tachycardia, he is afebrile and has a normal respiratory rate with a normal blood pressure. He appears dehydrated and consistent with his history likely will require several liters of IV fluids, several doses of  antibiotics. We'll check a basic metabolic panel to evaluate for acidosis, urinalysis 2-gauge hydration level.  Dilaudid Zofran 3L IVF   Change of shift -  care signed out to Dr. Algernon Huxley, MD 11/30/11 [phone]

## 2011-11-30 NOTE — Progress Notes (Signed)
Called patient: c/o unrelieved N/V, abdominal pain and severe cramping.  Plan 1. Portable KUB 2. For ileus or partial obstruction - NG to low suction 3. If x-ray OK will start reglan IV to control N/V

## 2011-11-30 NOTE — ED Notes (Signed)
Report given to receiving nurse at [place] [address] pts wife is at bedside and informed of pts room assignment

## 2011-12-01 ENCOUNTER — Inpatient Hospital Stay (HOSPITAL_COMMUNITY): Payer: Commercial Managed Care - PPO

## 2011-12-01 DIAGNOSIS — K5289 Other specified noninfective gastroenteritis and colitis: Secondary | ICD-10-CM

## 2011-12-01 DIAGNOSIS — R112 Nausea with vomiting, unspecified: Secondary | ICD-10-CM

## 2011-12-01 DIAGNOSIS — R197 Diarrhea, unspecified: Secondary | ICD-10-CM

## 2011-12-01 LAB — BASIC METABOLIC PANEL
BUN: 16 mg/dL (ref 6–23)
BUN: 15 mg/dL (ref 6–23)
CO2: 19 mEq/L (ref 19–32)
CO2: 23 mEq/L (ref 19–32)
Calcium: 9.4 mg/dL (ref 8.4–10.5)
Calcium: 9.6 mg/dL (ref 8.4–10.5)
Chloride: 110 mEq/L (ref 96–112)
Chloride: 106 mEq/L (ref 96–112)
Creatinine, Ser: 1.12 mg/dL (ref 0.50–1.35)
Creatinine, Ser: 1.14 mg/dL (ref 0.50–1.35)
GFR calc Af Amer: 88 mL/min — ABNORMAL LOW (ref >90)
GFR calc Af Amer: 86 mL/min — ABNORMAL LOW (ref >90)
GFR calc non Af Amer: 76 mL/min — ABNORMAL LOW (ref >90)
GFR calc non Af Amer: 74 mL/min — ABNORMAL LOW (ref >90)
Glucose, Bld: 109 mg/dL — ABNORMAL HIGH (ref 70–99)
Glucose, Bld: 111 mg/dL — ABNORMAL HIGH (ref 70–99)
Potassium: 3.8 mEq/L (ref 3.5–5.1)
Potassium: 3.8 mEq/L (ref 3.5–5.1)
Sodium: 144 mEq/L (ref 135–145)
Sodium: 141 mEq/L (ref 135–145)

## 2011-12-01 LAB — CBC WITH DIFFERENTIAL/PLATELET
Basophils Absolute: 0 10*3/uL (ref 0.0–0.1)
Basophils Relative: 0 % (ref 0–1)
Eosinophils Absolute: 0 10*3/uL (ref 0.0–0.7)
Eosinophils Relative: 0 % (ref 0–5)
HCT: 48.1 % (ref 39.0–52.0)
Hemoglobin: 16.9 g/dL (ref 13.0–17.0)
Lymphocytes Relative: 26 % (ref 12–46)
Lymphs Abs: 4.5 10*3/uL — ABNORMAL HIGH (ref 0.7–4.0)
MCH: 30.8 pg (ref 26.0–34.0)
MCHC: 35.1 g/dL (ref 30.0–36.0)
MCV: 87.8 fL (ref 78.0–100.0)
Monocytes Absolute: 1.5 10*3/uL — ABNORMAL HIGH (ref 0.1–1.0)
Monocytes Relative: 9 % (ref 3–12)
Neutro Abs: 11.3 10*3/uL — ABNORMAL HIGH (ref 1.7–7.7)
Neutrophils Relative %: 65 % (ref 43–77)
Platelets: 191 10*3/uL (ref 150–400)
RBC: 5.48 MIL/uL (ref 4.22–5.81)
RDW: 13.2 % (ref 11.5–15.5)
WBC: 17.3 10*3/uL — ABNORMAL HIGH (ref 4.0–10.5)

## 2011-12-01 LAB — CBC
HCT: 46.2 % (ref 39.0–52.0)
Hemoglobin: 16 g/dL (ref 13.0–17.0)
MCH: 30.3 pg (ref 26.0–34.0)
MCHC: 34.6 g/dL (ref 30.0–36.0)
MCV: 87.5 fL (ref 78.0–100.0)
Platelets: 182 10*3/uL (ref 150–400)
RBC: 5.28 MIL/uL (ref 4.22–5.81)
RDW: 13.4 % (ref 11.5–15.5)
WBC: 17.5 10*3/uL — ABNORMAL HIGH (ref 4.0–10.5)

## 2011-12-01 MED ORDER — MORPHINE SULFATE 2 MG/ML IJ SOLN
2.0000 mg | INTRAMUSCULAR | Status: DC | PRN
  Administered 2011-12-01 (×3): 2 mg via INTRAVENOUS
  Filled 2011-12-01 (×2): qty 1

## 2011-12-01 MED ORDER — SODIUM CHLORIDE 0.9 % IV SOLN
1000.0000 mL | INTRAVENOUS | Status: DC
  Administered 2011-12-01 – 2011-12-02 (×2): 1000 mL via INTRAVENOUS

## 2011-12-01 MED ORDER — ONDANSETRON 8 MG/NS 50 ML IVPB
8.0000 mg | INTRAVENOUS | Status: DC | PRN
  Administered 2011-12-02 – 2011-12-03 (×3): 8 mg via INTRAVENOUS
  Filled 2011-12-01 (×6): qty 8

## 2011-12-01 MED ORDER — DICYCLOMINE HCL 10 MG PO CAPS
20.0000 mg | ORAL_CAPSULE | Freq: Two times a day (BID) | ORAL | Status: DC
  Filled 2011-12-01 (×2): qty 2

## 2011-12-01 MED ORDER — ONDANSETRON HCL 4 MG/2ML IJ SOLN
INTRAMUSCULAR | Status: AC
  Filled 2011-12-01: qty 2

## 2011-12-01 MED ORDER — MORPHINE SULFATE 4 MG/ML IJ SOLN
4.0000 mg | INTRAMUSCULAR | Status: DC | PRN
  Administered 2011-12-01 – 2011-12-03 (×9): 4 mg via INTRAVENOUS
  Filled 2011-12-01 (×9): qty 1

## 2011-12-01 MED ORDER — DICYCLOMINE HCL 20 MG PO TABS
20.0000 mg | ORAL_TABLET | Freq: Two times a day (BID) | ORAL | Status: DC
  Administered 2011-12-01 – 2011-12-04 (×6): 20 mg via ORAL
  Filled 2011-12-01 (×7): qty 1

## 2011-12-01 MED ORDER — BISMUTH SUBSALICYLATE 262 MG/15ML PO SUSP
30.0000 mL | Freq: Three times a day (TID) | ORAL | Status: DC
  Administered 2011-12-01 – 2011-12-04 (×11): 30 mL via ORAL
  Filled 2011-12-01 (×4): qty 236

## 2011-12-01 NOTE — Progress Notes (Signed)
0405 Patient vomiting unrelieved by zofran., abd xray ordered. Will continue to monitor.

## 2011-12-01 NOTE — Progress Notes (Signed)
Subjective: [name] reports that Tuesday he went to a food show and started feeling bad about 4-6 hours later. 12 hours later he started having diarrhea followed by N/V. He was unable to keep anything down since that time.   Per Rn patient had a rough night with continued N/V and abdominal pain and cramping.   Objective: Lab: Lab Results  Component Value Date   WBC 17.1* 11/30/2011   HGB 16.3 11/30/2011   HCT 46.6 11/30/2011   MCV 88.3 11/30/2011   PLT 172 11/30/2011   BMET    Component Value Date/Time   NA 145 11/30/2011 1503   K 4.3 11/30/2011 1503   CL 110 11/30/2011 1503   CO2 22 11/30/2011 1503   GLUCOSE 127* 11/30/2011 1503   BUN 18 11/30/2011 1503   CREATININE 1.43* 11/30/2011 1503   CALCIUM 9.4 11/30/2011 1503   GFRNONAA 57* 11/30/2011 1503   GFRAA 66* 11/30/2011 1503     Imaging: KUB 11/30/11: IMPRESSION:  Left nephrolithiasis  Nonobstructive bowel gas pattern  Acute Abd series 12/01/11: IMPRESSION:  No evidence of active pulmonary disease. Probable left renal  stone. Nonobstructive bowel gas pattern. No significant change  since previous study.  Scheduled Meds:   . sodium chloride  1,000 mL Intravenous Once  . sodium chloride   Intravenous STAT  . amitriptyline  50 mg Oral QHS  . aspirin EC  81 mg Oral Daily  . ciprofloxacin  400 mg Intravenous Q24H  . ezetimibe-simvastatin  1 tablet Oral QHS  . HYDROmorphone      . HYDROmorphone  1 mg Intravenous Once  . levothyroxine  25 mcg Oral QAC breakfast  . LORazepam      . LORazepam  2 mg Intravenous Once  . metoCLOPramide (REGLAN) injection  5 mg Intravenous Q6H  . metronidazole  500 mg Intravenous Q8H  . pantoprazole (PROTONIX) IV  40 mg Intravenous QHS  . promethazine  25 mg Intravenous Once  . sertraline  50 mg Oral Daily  . DISCONTD: amitriptyline  50 mg Oral Daily  . DISCONTD: aspirin  81 mg Oral Daily   Continuous Infusions:   . sodium chloride 1,000 mL (12/01/11 0237)  . DISCONTD: sodium chloride     PRN  Meds:.acetaminophen, acetaminophen, alum & mag hydroxide-simeth, diphenhydrAMINE, morphine injection, ondansetron (ZOFRAN) IV, DISCONTD:  morphine injection   Physical Exam: Filed Vitals:   12/01/11 0510  BP: 162/98  Pulse: 58  Temp: 98.2 F (36.8 C)  Resp: 18  WNWD AA man who is feeling bad but does not seem to be in acute distress HEENT- C&S appear slightly icteric, PERRLA Neck- supple Cor- 2+ radial, RRR Pulm - normal respirations Abd - BS in RLQ only the rest of the abdomen being quiet. No guarding or rebound. Most tender left side just below the level of the umbilicus. Neuro - drowsy but alert, good memory, speech clear.      Assessment/Plan: 1. GI - patient with pain and refractory N/V. He has a leukocytosis. Differential includes food poisoning but favor diverticulitis vs other intra-abdominal infection. No peritoneal signs or surgical abdomen.  Plan Increase zofran to 8 mg, MS to 4 mg on a q 3 hour basis.  D/c reglan  CT abdomen/pelvis w/o oral contrast  F/u CBCD  GI consult  2. ARI - Creatinine was 2.4 and come down to 1.43 with hydration.  Plan- continue IV fluids  Follow up [facility]   Maxine Huynh [facility] (o) [phone]; (c) [phone]  12/01/2011, 6:50 AM

## 2011-12-01 NOTE — Consult Note (Signed)
[facility] Consult: 10:03 AM 12/01/2011   Referring Provider: Dr Debby Bud Primary Care Physician:  Illene Regulus, MD Primary Gastroenterologist:  Seen remotely for EGD at [facility].  Unable to find reports of his EGD    Reason for Consultation:  Acute GI illness with n/v/d  HPI: [name] is a 51 y.o. male.  Hx of GERD, mostly controlled with Prevacid but does have active reflux sxs with spicey foods and peppers.  As 51 year old had ruptured appendix and developed post op obstruction requiring a second surgery.  Has cluster headaches treated with Trigeminal nerve destruction x 2.  Does not use NSAIDs. Hx non-toxic, multinodular goiter.  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  Attended a food tasting event on Tuesday 8/6 in [place].  Around that same day/midnight, sticken with acute non-bloody, bilious vomiting, watery diarrhea, paroxysms of intestinal cramps.  This persists to the present, though frequency has decreased.  At worst having 32 diarrheal episodes over 6 hours.  Some sweats, feels weak, no presyncope.  He is oliguric. Pain can be 10/10 at worst.  Pt and his assistant, who attended same food tasting, recall that pt ate sausage pizza, seafood dip, crabmeat dip and "raw" tuna ( pt says what is called raw tuna has actually been partially cooked on board fishing vessels and is not actually raw) which his assistant did not consume.  Assistant is not ill.   Went to [facility] ED yesterday.  WBCs 16.0, Hgb 18.0, creatinine 2.3,  glucose 170.  Admitted for supportive care. This AM WBCs are 17.1, Hgb 16.3, creatinine is 1.4.    On CT scan he has stranding in bladder/?cystitis (urinalysis is negative), non-obstructing kidney stone, right hip degenerative changes. GI organs unremarkable.   Started on IV Cipro and Flagyl and scheduled Reglan. Received two, 1000cc, bolus of Normal Saline, 3rd liter  currently running at 75/ml/hour.   Pt still describes infrequent urination, n/v/loose stools and abdominal pain/cramps. Feels week.    Pt also shares that within 10 hours of eating crap dip at a wedding on July 17th, he had 2 to 3 days of diarrhea, n/v and worsenig reflux.  Dr Debby Bud added Carafate to regimen, but pt no nonger needing to use this.  These sxs were nothing like what he is having currently.   ROS:   Oliguria as above.  No blood in urine.   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  No limb swelling.   No DOE. Weight stable.  Generally good appetite.  No current headache.  No skin rash, sores, itching. No solid food dysphagia.  No constipation in recent past.    Past Medical History  Diagnosis Date  . Hypertension   . Unspecified intestinal obstruction   . Unspecified asthma   . Chest pain, atypical   . Pulmonary nodule   . Nontoxic multinodular goiter   . Painful respiration   . Dyspnea on exertion   . Trigeminal neuralgia   . Cluster headache   . Hyperlipidemia   . GERD (gastroesophageal reflux disease)   . Complication of anesthesia     difficult waking up"    Past Surgical History  Procedure Date  . Appendectomy   . Knee arthroscopy   . 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 Right shoulder redo-reconstruction 06/21/10  . Destruction trigeminal nerve via neurolytic agent     Prior to Admission medications  Medication Sig Start Date End Date Taking? Authorizing Provider  amitriptyline (ELAVIL) 25 MG tablet Take 50 mg by mouth daily.   Yes Historical Provider, MD  aspirin 81 MG EC tablet Take 81 mg by mouth daily.     Yes Historical Provider, MD  cetirizine-pseudoephedrine (ZYRTEC-D) 5-120 MG per tablet Take 1 tablet by mouth 2 (two) times daily.     Yes Historical Provider, MD  diltiazem (TIAZAC) 240 MG 24 hr capsule Take 1 capsule (240 mg total) by mouth daily. 10/03/10  Yes Jacques Navy, MD   lansoprazole (PREVACID) 30 MG capsule TAKE ONE CAPSULE BY MOUTH DAILY 11/06/11  Yes Jacques Navy, MD  levothyroxine (SYNTHROID, LEVOTHROID) 25 MCG tablet TAKE 1 TABLET BY MOUTH DAILY 08/06/11  Yes Jacques Navy, MD  oxyCODONE-acetaminophen (ROXICET) 5-325 MG per tablet Take 1 tablet by mouth every 8 (eight) hours as needed for pain. 11/22/11 12/02/11 Yes Jacques Navy, MD  sertraline (ZOLOFT) 50 MG tablet Take 1 tablet (50 mg total) by mouth daily. 12/23/10 12/23/11 Yes Jacques Navy, MD  VYTORIN 10-80 MG per tablet TAKE ONE TABLET BY MOUTH DAILY 07/02/11  Yes Jacques Navy, MD    Scheduled Meds:    . sodium chloride  1,000 mL Intravenous Once  . sodium chloride   Intravenous STAT  . amitriptyline  50 mg Oral QHS  . aspirin EC  81 mg Oral Daily  . ciprofloxacin  400 mg Intravenous Q24H  . ezetimibe-simvastatin  1 tablet Oral QHS  . HYDROmorphone      . levothyroxine  25 mcg Oral QAC breakfast  . LORazepam      . LORazepam  2 mg Intravenous Once  . metoCLOPramide (REGLAN) injection  5 mg Intravenous Q6H  . metronidazole  500 mg Intravenous Q8H  . pantoprazole (PROTONIX) IV  40 mg Intravenous QHS  . sertraline  50 mg Oral Daily   Infusions:    . sodium chloride 1,000 mL (12/01/11 0237)   PRN Meds: acetaminophen, acetaminophen, alum & mag hydroxide-simeth, diphenhydrAMINE, morphine injection, ondansetron (ZOFRAN) IV,    Allergies as of 11/30/2011  . (No Known Allergies)    Family History  Problem Relation Age of Onset  . Diabetes Mother   . Hyperlipidemia Mother   . Hypertension Mother   . Hypertension Father   . Heart disease Father     CAD/CHF  . Cancer Father     Mesothelioma  . Heart disease Maternal Aunt   . Prostate cancer Neg Hx   . Colon cancer Neg Hx     History   Social History  . Marital Status: Married    Spouse Name: N/A    Number of Children: N/A  . Years of Education: N/A   Occupational History  . Executive [employer]     Social History Main Topics  . Smoking status: Current Everyday Smoker -- 0.2 packs/day for 18 years    Types: Cigarettes  . Smokeless tobacco: Never Used   Comment: 1-2 cigs per day, started age 33  . Alcohol Use: Yes     rare  . Drug Use: No  . Sexually Active: Yes   Other Topics Concern  . Not on file   Social History Narrative   [place] 5 years divorced; remarried '961 son - '85Regular Exercise -  NO     PHYSICAL EXAM: Vital signs in last 24 hours: Temp:  [97.6 F (36.4 C)-98.3 F (36.8 C)]  98.2 F (36.8 C) (08/09 0510) Pulse Rate:  [58-78] 58  (08/09 0510) Resp:  [18-19] 18  (08/09 0510) BP: (117-175)/(88-103) 162/98 mmHg (08/09 0510) SpO2:  [98 %-100 %] 100 % (08/09 0510) Weight:  [236 lb (107.049 kg)] 236 lb (107.049 kg) (08/09 0635)  General: robust but ill appearing, non-toxic, pleasant, AAM.  Looks diaphoretic Head:  No asymetry or facial edema  Eyes:  No icterus or pallor Ears:  No hearing deficits Nose:  Some sinus congestion Mouth:  MM slightly dry, dentition good.  Neck:  No mass or JVD Lungs:  Clear B.   Heart: RRR.  No MRG.  s1 and s2 present.  Abdomen:  Tender diffusely, no guard or rebound.  Active BS, none tympanitic or tinkling. .   Rectal: did not perform   Musc/Skeltl: no joint deformity or swelling Extremities:  No pedal edema  Neurologic:  No tremor, no gross weakness.  Not disoriented or confused.  Skin:  No sores, rash Tattoos:  none Nodes:  No adenopathy at neck or groin   Psych:  Pleasant, not depressed or agitated.   Intake/Output from previous day: 08/08 0701 - 08/09 0700 In: 5548.4 [I.V.:5148.4; IV Piggyback:400] Out: 202 [Emesis/NG output:200; Stool:2] Intake/Output this shift:    LAB RESULTS:  Stool  Culture in progress   Basename 12/01/11 0620 11/30/11 1503 11/30/11 0601  WBC 17.5* 17.1* 16.0*  HGB 16.0 16.3 18.5*  HCT 46.2 46.6 50.3  PLT 182 172 219   BMET Lab Results   Component Value Date   NA 144 12/01/2011   NA 145 11/30/2011   NA 138 11/30/2011   K 3.8 12/01/2011   K 4.3 11/30/2011   K 3.7 11/30/2011   CL 110 12/01/2011   CL 110 11/30/2011   CL 101 11/30/2011   CO2 19 12/01/2011   CO2 22 11/30/2011   CO2 13* 11/30/2011   GLUCOSE 109* 12/01/2011   GLUCOSE 127* 11/30/2011   GLUCOSE 170* 11/30/2011   BUN 16 12/01/2011   BUN 18 11/30/2011   BUN 21 11/30/2011   CREATININE 1.12 12/01/2011   CREATININE 1.43* 11/30/2011   CREATININE 2.30* 11/30/2011   CALCIUM 9.4 12/01/2011   CALCIUM 9.4 11/30/2011   CALCIUM 11.2* 11/30/2011   LFT  Basename 11/30/11 0743  PROT 9.7*  ALBUMIN 5.5*  AST 27  ALT 23  ALKPHOS 75  BILITOT 0.5  BILIDIR 0.1  IBILI 0.4   PT/INR Lab Results  Component Value Date   INR 1.0 12/08/2008    RADIOLOGY STUDIES: Ct Abdomen Pelvis Wo Contrast 12/01/2011  *RADIOLOGY REPORT*  Clinical Data: 4 day history of umbilical region pain.  Severe nausea, vomiting, diarrhea.  Appendectomy.  CT ABDOMEN AND PELVIS WITHOUT CONTRAST  Technique:  Multidetector CT imaging of the abdomen and pelvis was performed following the standard protocol without intravenous contrast.  Comparison: 06/17/2005.  Findings: Lung Bases: Dependent atelectasis.  Lingular atelectasis. Heart grossly normal.  Liver:  Unenhanced CT was performed per clinician order.  Lack of IV contrast limits sensitivity and specificity, especially for evaluation of abdominal/pelvic solid viscera.  Liver within normal limits.  Spleen:  Normal.  Gallbladder:  Normal.  Common bile duct:  Normal.  Pancreas:  Normal.  Adrenal glands:  Normal.  Kidneys:  No hydronephrosis.  6 mm nonobstructing left upper pole renal collecting system calculus.  The ureters appear within normal limits.  Calcified phleboliths present in the anatomic pelvis along the course of the ureters.  Duplicated right renal collecting system. Exophytic cystic  lesions in the left interpolar kidney measuring about 2 cm, compatible with renal cysts.  Stomach:  Normal  allowing for noncontrast technique.  Small bowel:  No small bowel dilation.  Negative for obstruction. No adenopathy. Tiny fat containing periumbilical hernia.  Colon:   No inflammatory changes.  Colon decompressed.  Mild diverticulosis.  Appendix absent.  Pelvic Genitourinary:  No free fluid.  Mural irregularity is present the urinary bladder, with small focus of fat identified on the sagittal images.  This is most commonly associated with cystitis.  Fatty infiltration of the bladder wall can be associated with chronic cystitis.  Correlation with urinalysis recommended.  Bones:  No aggressive osseous lesions.  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  Vasculature: Mild atherosclerosis.  No aneurysm.  IMPRESSION:  1. Mild urinary bladder mural irregularity and stranding raising the possibility of cystitis.  Tiny focus of fat in the bladder dome can be associated with chronic cystitis.  Correlation with urinalysis recommended. 2.  Left renal cysts. 3.  Nonobstructing left upper pole 6 mm renal collecting system calculus. 4.  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  Original Report Authenticated By: Andreas Newport, M.D.   Dg Abd 1 View 11/30/2011  *RADIOLOGY REPORT*  Clinical Data: Vomiting, diarrhea  ABDOMEN - 1 VIEW  Comparison: 01/04/2007  Findings: 6 mm opaque calculus projects over the left kidney interpolar region compatible with a left renal calculus. Nonobstructive bowel gas pattern.  Pelvic calcifications consistent with venous phleboliths.  No acute abnormal osseous finding.  IMPRESSION: Left nephrolithiasis  Nonobstructive bowel gas pattern  Original Report Authenticated By: Judie Petit. Ruel Favors, M.D.   Dg Abd Acute W/chest 12/01/2011  *RADIOLOGY REPORT*  Clinical Data: Vomiting and diarrhea.  ACUTE ABDOMEN SERIES (ABDOMEN 2 VIEW & CHEST 1 VIEW)  Comparison: Abdomen 11/30/2011.  Chest 07/10/2011.  Findings: Shallow inspiration.  Normal heart size and pulmonary vascularity.   Linear fibrosis in the left mid lung.  No focal airspace consolidation.  No blunting of costophrenic angles.  No pneumothorax.  Mediastinal contours appear intact.  Postop changes in the right shoulder.  No significant change since previous study.  Paucity of gas in the abdomen.  No small or large bowel distension. Calcification projected over the upper pole left kidney consistent with a renal stone and stable since previous study.  No free air. No abnormal air fluid levels.  Calcified phleboliths in the pelvis. Bones appear grossly intact.  No significant change since previous study.  IMPRESSION: No evidence of active pulmonary disease.  Probable left renal stone.  Nonobstructive bowel gas pattern.  No significant change since previous study.  Original Report Authenticated By: Marlon Pel, M.D.    ENDOSCOPIC STUDIES: EGD Remotely.  Unable to locate report.  Done at [facility], pt recalls this was when he was started on Prevacid for GERD  IMPRESSION: *  Acute N/V/D. Non-bloody stool.  Likely acute food borne illness. *  Dehydration, acute renal failure *  Hx GERD, breakthrough sxs at time.  *  Hyperglycemia.  *  Kidney stone *  ? Chronic cystitis by CT, does not have active UTI at present.   PLAN: *  Stop Reglan, it may exacerbate diarrhea and no evidence ileus on imaging.  *  Stop ASA for now due to potential for UGI irritation.  *  increase IVF to 150 CC/hour, would like to get his urine output increased.    LOS: 1 day   Jennye Moccasin  12/01/2011, 10:03 AM Pager: [phone]  I have reviewed the above note, examined the patient . Acute  GI illness consistent with an exotoxin poisoning ( ?Staph),  Started within 8 hours of food ingestion, large volume secretory diarrhea which has since slowed down, he is now  voiding frequently on IV 150 cc/hr. Stool  culture not likely to  Show grow the toxin if it is food poisoning. Will add Pepto bismol to "bind" the toxin. Also add dicyclomine for  cramps. OK to continue Flagyl and Cipro although the Flagyl may be contributing to his nausea.. I have checked his stool which is non-odorous,watery,non-bloody ( secretory)  [facility] Pager # [phone]

## 2011-12-01 NOTE — Progress Notes (Signed)
1610 Notified Dr. Nehemiah Settle due to patient clo of severe abd pain and morphine only lasting about 3hrs.  Order received change morphine to q4h. Morphine 2mg  given at 0058 IV. 0130 Patient sleeping.

## 2011-12-01 NOTE — Progress Notes (Signed)
Subjective: [name] is feeling better - sitting up in chair, decreased N/V, decreased pain.  Appreciate Dr. Regino Schultze help.  Objective: Lab: Lab Results  Component Value Date   WBC 17.3* 12/01/2011   HGB 16.9 12/01/2011   HCT 48.1 12/01/2011   MCV 87.8 12/01/2011   PLT 191 12/01/2011   BMET    Component Value Date/Time   NA 141 12/01/2011 1050   K 3.8 12/01/2011 1050   CL 106 12/01/2011 1050   CO2 23 12/01/2011 1050   GLUCOSE 111* 12/01/2011 1050   BUN 15 12/01/2011 1050   CREATININE 1.14 12/01/2011 1050   CALCIUM 9.6 12/01/2011 1050   GFRNONAA 74* 12/01/2011 1050   GFRAA 86* 12/01/2011 1050     Imaging: CT abd/pelvis 12/01/11: IMPRESSION:  1. Mild urinary bladder mural irregularity and stranding raising  the possibility of cystitis. Tiny focus of fat in the bladder dome  can be associated with chronic cystitis. Correlation with  urinalysis recommended.  2. Left renal cysts.  3. Nonobstructing left upper pole 6 mm renal collecting system  calculus.  4. 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  Scheduled Meds:   . sodium chloride  1,000 mL Intravenous Once  . sodium chloride   Intravenous STAT  . amitriptyline  50 mg Oral QHS  . bismuth subsalicylate  30 mL Oral TID AC & HS  . ciprofloxacin  400 mg Intravenous Q24H  . dicyclomine  20 mg Oral BID  . ezetimibe-simvastatin  1 tablet Oral QHS  . levothyroxine  25 mcg Oral QAC breakfast  . LORazepam      . metronidazole  500 mg Intravenous Q8H  . pantoprazole (PROTONIX) IV  40 mg Intravenous QHS  . sertraline  50 mg Oral Daily  . DISCONTD: aspirin EC  81 mg Oral Daily  . DISCONTD: dicyclomine  20 mg Oral BID  . DISCONTD: metoCLOPramide (REGLAN) injection  5 mg Intravenous Q6H   Continuous Infusions:   . sodium chloride 1,000 mL (12/01/11 1333)  . DISCONTD: sodium chloride 1,000 mL (12/01/11 0237)   PRN Meds:.acetaminophen, acetaminophen, alum & mag hydroxide-simeth, diphenhydrAMINE, morphine injection, ondansetron  (ZOFRAN) IV, DISCONTD:  morphine injection, DISCONTD:  morphine injection, DISCONTD: ondansetron (ZOFRAN) IV   Physical Exam: Filed Vitals:   12/01/11 1412  BP: 150/86  Pulse: 57  Temp: 98 F (36.7 C)  Resp: 20        Assessment/Plan: 1. GI - working diagnosis is staphylococcal food poisoning.  Plan Continue IVF  Agree with adding bentyl  F/u lab  Dispo - home soon    [facility] Call-[facility] - [facility] (o) [phone]; ([phone]  12/01/2011, 7:29 PM

## 2011-12-02 LAB — CBC
HCT: 42.8 % (ref 39.0–52.0)
Hemoglobin: 14.6 g/dL (ref 13.0–17.0)
MCH: 30.5 pg (ref 26.0–34.0)
MCHC: 34.1 g/dL (ref 30.0–36.0)
MCV: 89.4 fL (ref 78.0–100.0)
Platelets: 164 10*3/uL (ref 150–400)
RBC: 4.79 MIL/uL (ref 4.22–5.81)
RDW: 13.3 % (ref 11.5–15.5)
WBC: 12.5 10*3/uL — ABNORMAL HIGH (ref 4.0–10.5)

## 2011-12-02 LAB — BASIC METABOLIC PANEL
BUN: 19 mg/dL (ref 6–23)
CO2: 23 mEq/L (ref 19–32)
Calcium: 8.7 mg/dL (ref 8.4–10.5)
Chloride: 107 mEq/L (ref 96–112)
Creatinine, Ser: 1.21 mg/dL (ref 0.50–1.35)
GFR calc Af Amer: 80 mL/min — ABNORMAL LOW (ref >90)
GFR calc non Af Amer: 69 mL/min — ABNORMAL LOW (ref >90)
Glucose, Bld: 88 mg/dL (ref 70–99)
Potassium: 3.4 mEq/L — ABNORMAL LOW (ref 3.5–5.1)
Sodium: 140 mEq/L (ref 135–145)

## 2011-12-02 MED ORDER — POTASSIUM CHLORIDE CRYS ER 20 MEQ PO TBCR
20.0000 meq | EXTENDED_RELEASE_TABLET | Freq: Two times a day (BID) | ORAL | Status: AC
  Administered 2011-12-02 (×2): 20 meq via ORAL
  Filled 2011-12-02 (×2): qty 1

## 2011-12-02 MED ORDER — SODIUM CHLORIDE 0.9 % IV SOLN
INTRAVENOUS | Status: DC
  Administered 2011-12-02 – 2011-12-04 (×4): via INTRAVENOUS
  Filled 2011-12-02 (×10): qty 1000

## 2011-12-02 NOTE — Progress Notes (Signed)
Subjective: Feeling better - sitting up in a chair. Still with nausea and limited PO intake.  Objective: Lab: Lab Results  Component Value Date   WBC 12.5* 12/02/2011   HGB 14.6 12/02/2011   HCT 42.8 12/02/2011   MCV 89.4 12/02/2011   PLT 164 12/02/2011   BMET    Component Value Date/Time   NA 140 12/02/2011 0540   K 3.4* 12/02/2011 0540   CL 107 12/02/2011 0540   CO2 23 12/02/2011 0540   GLUCOSE 88 12/02/2011 0540   BUN 19 12/02/2011 0540   CREATININE 1.21 12/02/2011 0540   CALCIUM 8.7 12/02/2011 0540   GFRNONAA 69* 12/02/2011 0540   GFRAA 80* 12/02/2011 0540     Imaging: no new imaging Scheduled Meds:   . amitriptyline  50 mg Oral QHS  . bismuth subsalicylate  30 mL Oral TID AC & HS  . ciprofloxacin  400 mg Intravenous Q24H  . dicyclomine  20 mg Oral BID  . ezetimibe-simvastatin  1 tablet Oral QHS  . levothyroxine  25 mcg Oral QAC breakfast  . metronidazole  500 mg Intravenous Q8H  . pantoprazole (PROTONIX) IV  40 mg Intravenous QHS  . potassium chloride  20 mEq Oral BID  . sertraline  50 mg Oral Daily  . DISCONTD: sodium chloride  1,000 mL Intravenous Once  . DISCONTD: aspirin EC  81 mg Oral Daily  . DISCONTD: dicyclomine  20 mg Oral BID  . DISCONTD: metoCLOPramide (REGLAN) injection  5 mg Intravenous Q6H   Continuous Infusions:   . 0.9 % sodium chloride with kcl    . DISCONTD: sodium chloride 1,000 mL (12/01/11 0237)  . DISCONTD: sodium chloride 1,000 mL (12/02/11 0110)   PRN Meds:.acetaminophen, acetaminophen, alum & mag hydroxide-simeth, diphenhydrAMINE, morphine injection, ondansetron (ZOFRAN) IV   Physical Exam: Filed Vitals:   12/02/11 0532  BP: 118/80  Pulse: 59  Temp: 98 F (36.7 C)  Resp: 18    Intake/Output Summary (Last 24 hours) at 12/02/11 1039 Last data filed at 12/02/11 0533  Gross per 24 hour  Intake   2633 ml  Output     75 ml  Net   2558 ml   Wt Readings from Last 3 Encounters:  12/01/11 236 lb (107.049 kg)  11/22/11 236 lb (107.049 kg)   11/13/11 234 lb (106.142 kg)   Pleasant man sitting up in the chair. Says he still feels weak. HEENT- C&S clear Cor - RRR Pulm - normal respirations Abd - BS+ x 4, soft w/o guarding Neuro - A&O x 3      Assessment/Plan: 1. GI- better but still w/ nausea and not taking much PO fluid or food. Appreciate GI help Plan Continue IVF  K replacement (order by Dr. B)  Advance diet to full liquid  2. ARI - resolved    Illene Regulus [facility] - [facility] ([facility]) [phone]; [phone]  12/02/2011, 10:34 AM

## 2011-12-02 NOTE — Progress Notes (Signed)
Subjective Vomited x1 this am, had pain during the night but no BM's, overall feeling better  Objective:unable to take more than sips of liquids at a time,  Vital signs in last 24 hours: Temp:  [97.8 F (36.6 C)-98 F (36.7 C)] 98 F (36.7 C) (08/10 0532) Pulse Rate:  [57-68] 59  (08/10 0532) Resp:  [18-20] 18  (08/10 0532) BP: (118-176)/(80-93) 118/80 mmHg (08/10 0532) SpO2:  [96 %-100 %] 96 % (08/10 0532) Last BM Date: 12/01/11 General:   Alert,  pleasant, cooperative in NAD Head:  Normocephalic and atraumatic. Eyes:  Sclera clear, no icterus.   Conjunctiva pink. Mouth:  No deformity or lesions, dentition normal. Neck:  Supple; no masses or thyromegaly. Heart:  Regular rate and rhythm; no murmurs, clicks, rubs,  or gallops. Lungs:  No wheezes or rales Abdomen:  Hyperactive bowl sounds, diffusely tender, ,mostly in epigastrium Msk:  Symmetrical without gross deformities. Normal posture. Pulses:  Normal pulses noted. Extremities:  Without clubbing or edema. Neurologic:  Alert and  oriented x4;  grossly normal neurologically. Skin:  Intact without significant lesions or rashes.  Intake/Output from previous day: 08/09 0701 - 08/10 0700 In: 2633 [P.O.:360; I.V.:2273] Out: 75 [Emesis/NG output:75] Intake/Output this shift:    Lab Results:  Basename 12/02/11 0540 12/01/11 1050 12/01/11 0620  WBC 12.5* 17.3* 17.5*  HGB 14.6 16.9 16.0  HCT 42.8 48.1 46.2  PLT 164 191 182   BMET  Basename 12/02/11 0540 12/01/11 1050 12/01/11 0620  NA 140 141 144  K 3.4* 3.8 3.8  CL 107 106 110  CO2 23 23 19   GLUCOSE 88 111* 109*  BUN 19 15 16   CREATININE 1.21 1.14 1.12  CALCIUM 8.7 9.6 9.4   LFT  Basename 11/30/11 0743  PROT 9.7*  ALBUMIN 5.5*  AST 27  ALT 23  ALKPHOS 75  BILITOT 0.5  BILIDIR 0.1  IBILI 0.4   PT/INR No results found for this basename: LABPROT:2,INR:2 in the last 72 hours Hepatitis Panel No results found for this basename: HEPBSAG,HCVAB,HEPAIGM,HEPBIGM in  the last 72 hours  Studies/Results: Ct Abdomen Pelvis Wo Contrast  12/01/2011  *RADIOLOGY REPORT*  Clinical Data: 4 day history of umbilical region pain.  Severe nausea, vomiting, diarrhea.  Appendectomy.  CT ABDOMEN AND PELVIS WITHOUT CONTRAST  Technique:  Multidetector CT imaging of the abdomen and pelvis was performed following the standard protocol without intravenous contrast.  Comparison: 06/17/2005.  Findings: Lung Bases: Dependent atelectasis.  Lingular atelectasis. Heart grossly normal.  Liver:  Unenhanced CT was performed per clinician order.  Lack of IV contrast limits sensitivity and specificity, especially for evaluation of abdominal/pelvic solid viscera.  Liver within normal limits.  Spleen:  Normal.  Gallbladder:  Normal.  Common bile duct:  Normal.  Pancreas:  Normal.  Adrenal glands:  Normal.  Kidneys:  No hydronephrosis.  6 mm nonobstructing left upper pole renal collecting system calculus.  The ureters appear within normal limits.  Calcified phleboliths present in the anatomic pelvis along the course of the ureters.  Duplicated right renal collecting system. Exophytic cystic lesions in the left interpolar kidney measuring about 2 cm, compatible with renal cysts.  Stomach:  Normal allowing for noncontrast technique.  Small bowel:  No small bowel dilation.  Negative for obstruction. No adenopathy. Tiny fat containing periumbilical hernia.  Colon:   No inflammatory changes.  Colon decompressed.  Mild diverticulosis.  Appendix absent.  Pelvic Genitourinary:  No free fluid.  Mural irregularity is present the urinary bladder, with small  focus of fat identified on the sagittal images.  This is most commonly associated with cystitis.  Fatty infiltration of the bladder wall can be associated with chronic cystitis.  Correlation with urinalysis recommended.  Bones:  No aggressive osseous lesions.  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  Vasculature: Mild atherosclerosis.  No aneurysm.   IMPRESSION:  1. Mild urinary bladder mural irregularity and stranding raising the possibility of cystitis.  Tiny focus of fat in the bladder dome can be associated with chronic cystitis.  Correlation with urinalysis recommended. 2.  Left renal cysts. 3.  Nonobstructing left upper pole 6 mm renal collecting system calculus. 4.  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  Original Report Authenticated By: Andreas Newport, M.D.   Dg Abd 1 View  11/30/2011  *RADIOLOGY REPORT*  Clinical Data: Vomiting, diarrhea  ABDOMEN - 1 VIEW  Comparison: 01/04/2007  Findings: 6 mm opaque calculus projects over the left kidney interpolar region compatible with a left renal calculus. Nonobstructive bowel gas pattern.  Pelvic calcifications consistent with venous phleboliths.  No acute abnormal osseous finding.  IMPRESSION: Left nephrolithiasis  Nonobstructive bowel gas pattern  Original Report Authenticated By: Judie Petit. Ruel Favors, M.D.   Dg Abd Acute W/chest  12/01/2011  *RADIOLOGY REPORT*  Clinical Data: Vomiting and diarrhea.  ACUTE ABDOMEN SERIES (ABDOMEN 2 VIEW & CHEST 1 VIEW)  Comparison: Abdomen 11/30/2011.  Chest 07/10/2011.  Findings: Shallow inspiration.  Normal heart size and pulmonary vascularity.  Linear fibrosis in the left mid lung.  No focal airspace consolidation.  No blunting of costophrenic angles.  No pneumothorax.  Mediastinal contours appear intact.  Postop changes in the right shoulder.  No significant change since previous study.  Paucity of gas in the abdomen.  No small or large bowel distension. Calcification projected over the upper pole left kidney consistent with a renal stone and stable since previous study.  No free air. No abnormal air fluid levels.  Calcified phleboliths in the pelvis. Bones appear grossly intact.  No significant change since previous study.  IMPRESSION: No evidence of active pulmonary disease.  Probable left renal stone.  Nonobstructive bowel gas pattern.  No significant  change since previous study.  Original Report Authenticated By: Marlon Pel, M.D.     ASSESSMENT:   Principal Problem:  *Diarrhea Active Problems:  HYPERLIPIDEMIA  HYPERTENSION  GERD  ARF (acute renal failure)  Nausea and vomiting  Tobacco abuse  Other and unspecified noninfectious gastroenteritis and colitis     PLAN:   Acute gastroenteritis, pathogen not known yet, but Staph exotoxin most likely, slowly recovering but still cannot go home till he can keep liquids down, Increase K+ in IV and oral. WBC trending down. Follow CBC,B-met Continue Flagyl, Cipro, Peptobismol, Lomotil prn,Zofran     LOS: 2 days   Lina Sar  12/02/2011, 8:25 AM

## 2011-12-03 LAB — BASIC METABOLIC PANEL
BUN: 17 mg/dL (ref 6–23)
CO2: 22 mEq/L (ref 19–32)
Calcium: 8.3 mg/dL — ABNORMAL LOW (ref 8.4–10.5)
Chloride: 109 mEq/L (ref 96–112)
Creatinine, Ser: 1.29 mg/dL (ref 0.50–1.35)
GFR calc Af Amer: 74 mL/min — ABNORMAL LOW (ref >90)
GFR calc non Af Amer: 64 mL/min — ABNORMAL LOW (ref >90)
Glucose, Bld: 87 mg/dL (ref 70–99)
Potassium: 4 mEq/L (ref 3.5–5.1)
Sodium: 139 mEq/L (ref 135–145)

## 2011-12-03 NOTE — Progress Notes (Signed)
Subjective Feeling better, tried tomato soup but could not eat it, less crampy abd. Pain, about 3 loose stools, ambulated with wife in hall way but was very tired  Objective: overall improved, still not voiding excessively on 150cc/hr ,labs not back yet Vital signs in last 24 hours: Temp:  [97.9 F (36.6 C)-98.1 F (36.7 C)] 98.1 F (36.7 C) (08/11 0600) Pulse Rate:  [57] 57  (08/11 0600) Resp:  [18-20] 18  (08/11 0600) BP: (105-131)/(66-86) 105/66 mmHg (08/11 0600) SpO2:  [93 %-96 %] 93 % (08/11 0600) Last BM Date: 12/01/11 General:   Alert,  pleasant, cooperative in NAD Head:  Normocephalic and atraumatic. Eyes:  Sclera clear, no icterus.   Conjunctiva pink. Mouth:  No deformity or lesions, dentition normal. Neck:  Supple; no masses or thyromegaly. Heart:  Regular rate and rhythm; no murmurs, clicks, rubs,  or gallops. Lungs:  No wheezes or rales Abdomen:  hyperactive bowl sounds, very tender epigastrium and LUQ, no distention  Msk:  Symmetrical without gross deformities. Normal posture. Pulses:  Normal pulses noted. Extremities:  Without clubbing or edema. Neurologic:  Alert and  oriented x4;  grossly normal neurologically. Skin:  Intact without significant lesions or rashes.  Intake/Output from previous day: 08/10 0701 - 08/11 0700 In: 1250 [I.V.:1200; IV Piggyback:50] Out: -  Intake/Output this shift:    Lab Results:  Basename 12/02/11 0540 12/01/11 1050 12/01/11 0620  WBC 12.5* 17.3* 17.5*  HGB 14.6 16.9 16.0  HCT 42.8 48.1 46.2  PLT 164 191 182   BMET  Basename 12/02/11 0540 12/01/11 1050 12/01/11 0620  NA 140 141 144  K 3.4* 3.8 3.8  CL 107 106 110  CO2 23 23 19   GLUCOSE 88 111* 109*  BUN 19 15 16   CREATININE 1.21 1.14 1.12  CALCIUM 8.7 9.6 9.4   LFT  Basename 11/30/11 0743  PROT 9.7*  ALBUMIN 5.5*  AST 27  ALT 23  ALKPHOS 75  BILITOT 0.5  BILIDIR 0.1  IBILI 0.4   PT/INR No results found for this basename: LABPROT:2,INR:2 in the last 72  hours Hepatitis Panel No results found for this basename: HEPBSAG,HCVAB,HEPAIGM,HEPBIGM in the last 72 hours  Studies/Results: Ct Abdomen Pelvis Wo Contrast  12/01/2011  *RADIOLOGY REPORT*  Clinical Data: 4 day history of umbilical region pain.  Severe nausea, vomiting, diarrhea.  Appendectomy.  CT ABDOMEN AND PELVIS WITHOUT CONTRAST  Technique:  Multidetector CT imaging of the abdomen and pelvis was performed following the standard protocol without intravenous contrast.  Comparison: 06/17/2005.  Findings: Lung Bases: Dependent atelectasis.  Lingular atelectasis. Heart grossly normal.  Liver:  Unenhanced CT was performed per clinician order.  Lack of IV contrast limits sensitivity and specificity, especially for evaluation of abdominal/pelvic solid viscera.  Liver within normal limits.  Spleen:  Normal.  Gallbladder:  Normal.  Common bile duct:  Normal.  Pancreas:  Normal.  Adrenal glands:  Normal.  Kidneys:  No hydronephrosis.  6 mm nonobstructing left upper pole renal collecting system calculus.  The ureters appear within normal limits.  Calcified phleboliths present in the anatomic pelvis along the course of the ureters.  Duplicated right renal collecting system. Exophytic cystic lesions in the left interpolar kidney measuring about 2 cm, compatible with renal cysts.  Stomach:  Normal allowing for noncontrast technique.  Small bowel:  No small bowel dilation.  Negative for obstruction. No adenopathy. Tiny fat containing periumbilical hernia.  Colon:   No inflammatory changes.  Colon decompressed.  Mild diverticulosis.  Appendix absent.  Pelvic Genitourinary:  No free fluid.  Mural irregularity is present the urinary bladder, with small focus of fat identified on the sagittal images.  This is most commonly associated with cystitis.  Fatty infiltration of the bladder wall can be associated with chronic cystitis.  Correlation with urinalysis recommended.  Bones:  No aggressive osseous lesions.  Right femoral  head AVN with secondary osteoarthritis.  Vasculature: Mild atherosclerosis.  No aneurysm.  IMPRESSION:  1. Mild urinary bladder mural irregularity and stranding raising the possibility of cystitis.  Tiny focus of fat in the bladder dome can be associated with chronic cystitis.  Correlation with urinalysis recommended. 2.  Left renal cysts. 3.  Nonobstructing left upper pole 6 mm renal collecting system calculus. 4.  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  Original Report Authenticated By: Andreas Newport, M.D.     ASSESSMENT:   Principal Problem:  *Diarrhea Active Problems:  HYPERLIPIDEMIA  HYPERTENSION  GERD  ARF (acute renal failure)  Nausea and vomiting  Tobacco abuse  Other and unspecified noninfectious gastroenteritis and colitis     PLAN:   Slowly improving acute gastroenteritis, labs are not back yet but I anticipate him being able to go home tomorrow on full liquids, Stools studies may not be available on discharge so we may have to continue oral antibiotic  For 5-7 days after D/C.     LOS: 3 days   Lina Sar  12/03/2011, 6:55 AM

## 2011-12-03 NOTE — Progress Notes (Signed)
Subjective: Still feeling weak and has been unable to eat or take in appreciable oral fluids.  Objective: Lab: Lab Results  Component Value Date   WBC 12.5* 12/02/2011   HGB 14.6 12/02/2011   HCT 42.8 12/02/2011   MCV 89.4 12/02/2011   PLT 164 12/02/2011   BMET    Component Value Date/Time   NA 139 12/03/2011 0547   K 4.0 12/03/2011 0547   CL 109 12/03/2011 0547   CO2 22 12/03/2011 0547   GLUCOSE 87 12/03/2011 0547   BUN 17 12/03/2011 0547   CREATININE 1.29 12/03/2011 0547   CALCIUM 8.3* 12/03/2011 0547   GFRNONAA 64* 12/03/2011 0547   GFRAA 74* 12/03/2011 0547     Imaging: no new imaging  Scheduled Meds:   . amitriptyline  50 mg Oral QHS  . bismuth subsalicylate  30 mL Oral TID AC & HS  . ciprofloxacin  400 mg Intravenous Q24H  . dicyclomine  20 mg Oral BID  . ezetimibe-simvastatin  1 tablet Oral QHS  . levothyroxine  25 mcg Oral QAC breakfast  . metronidazole  500 mg Intravenous Q8H  . pantoprazole (PROTONIX) IV  40 mg Intravenous QHS  . potassium chloride  20 mEq Oral BID  . sertraline  50 mg Oral Daily   Continuous Infusions:   . 0.9 % sodium chloride with kcl 150 mL/hr at 12/03/11 0329   PRN Meds:.acetaminophen, acetaminophen, alum & mag hydroxide-simeth, diphenhydrAMINE, morphine injection, ondansetron (ZOFRAN) IV   Physical Exam: Filed Vitals:   12/03/11 0600  BP: 105/66  Pulse: 57  Temp: 98.1 F (36.7 C)  Resp: 18    Intake/Output Summary (Last 24 hours) at 12/03/11 0850 Last data filed at 12/03/11 0600  Gross per 24 hour  Intake   2450 ml  Output      0 ml  Net   2450 ml   Wt Readings from Last 3 Encounters:  12/01/11 236 lb (107.049 kg)  11/22/11 236 lb (107.049 kg)  11/13/11 234 lb (106.142 kg)   Gen'l- heavyset AA man in bed in no acute distress Cor- RRR Abd - BS+, soft, no tenderness     Assessment/Plan: 1. GI - continued nausea and poor intake. Plan Continue to hydrate  Advance diet - he is advised to make bland choices and to focus on  fluids  2. ARI - Creatinine up to 1. 29  Plan Continue IV fluids and oral hydration  Dispo - if taking adequate PO's home 8/12   [name] IM Call-grp - [facility] ([phone]; ([phone]  12/03/2011, 8:46 AM

## 2011-12-04 LAB — CBC
HCT: 38.7 % — ABNORMAL LOW (ref 39.0–52.0)
Hemoglobin: 13.2 g/dL (ref 13.0–17.0)
MCH: 29.9 pg (ref 26.0–34.0)
MCHC: 34.1 g/dL (ref 30.0–36.0)
MCV: 87.8 fL (ref 78.0–100.0)
Platelets: 124 10*3/uL — ABNORMAL LOW (ref 150–400)
RBC: 4.41 MIL/uL (ref 4.22–5.81)
RDW: 12.8 % (ref 11.5–15.5)
WBC: 8.5 10*3/uL (ref 4.0–10.5)

## 2011-12-04 MED ORDER — ONDANSETRON HCL 4 MG PO TABS: 4.0000 mg | ORAL_TABLET | Freq: Three times a day (TID) | ORAL | Status: AC | PRN

## 2011-12-04 MED ORDER — CIPROFLOXACIN HCL 500 MG PO TABS: 500.0000 mg | ORAL_TABLET | Freq: Two times a day (BID) | ORAL | Status: AC

## 2011-12-04 MED ORDER — METRONIDAZOLE 500 MG PO TABS: 500.0000 mg | ORAL_TABLET | Freq: Three times a day (TID) | ORAL | Status: AC

## 2011-12-04 MED ORDER — DICYCLOMINE HCL 20 MG PO TABS: 20.0000 mg | ORAL_TABLET | Freq: Two times a day (BID) | ORAL | Status: DC

## 2011-12-04 NOTE — Discharge Summary (Signed)
NAMEJESON, [name]:  [id number]  MEDICAL RECORD NO.:  [id number]  LOCATION:  6N23C                        FACILITY:  [facility]  PHYSICIAN:  Rosalyn Gess. Norins, MD  DATE OF BIRTH:  Aug 18, 1960  DATE OF ADMISSION:  11/30/2011 DATE OF DISCHARGE:  12/04/2011                              DISCHARGE SUMMARY   ADMITTING DIAGNOSES:  Refractory nausea, vomiting, diarrhea.  DISCHARGE DIAGNOSES:  Food poisoning, most likely staphylococcal preformed toxin.  CONSULTANTS:  Hedwig Morton. Juanda Chance, MD, for GI.  PROCEDURES: 1. Acute abdominal film on November 30, 2011, with left nephrolithiasis,     nonobstructive bowel gas pattern. 2. Acute abdominal series with chest x-ray, which showed no evidence     of active intrapulmonary disease.  Probable left renal stone.     Nonobstructive bowel gas pattern. 3. CT scan of abdomen and pelvis which revealed a 6 mm nonobstructing     left upper pole renal collecting system calculus.  Ureters were     within normal limits.  Exophytic cystic lesions in the left     interpolar kidney measuring about 2 cm compatible with renal cysts.     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     Mild urinary bladder mural irregularity and stranding raising the     possibility of cystitis.  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  He sampled many things including a raw tuna dish and crab dip.  His assistant also who was at the food show ate all the same foods except for the raw tuna and crap dip. Approximately 6-8 hours after ingestion, the patient was significantly sick with nausea, vomiting followed by diarrhea.  For the next 48 hours, he continued to have significant problems with nausea, vomiting and diarrhea, was unable to keep down food or fluids, and therefore presented to the [facility] Emergency Department.  He was found to be significantly dehydrated.  He had  acute renal insufficiency with a creatinine of 2.3, calcium of 11.2.  He had a leukocytosis with a white count of 16,000.  He was subsequently admitted to the hospital. Please see the H and P as well as the EMR records for past medical history, family history, social history, and admission exam.  HOSPITAL COURSE: 1. GI:  The patient with a significant evaluation including x-rays and     CT, multiple lab draws.  He was seen in consultation by Dr. Lina Sar for Gastroenterology.  Working diagnosis was staphylococcal     food poisoning.  The patient was copiously hydrated with IV fluids.     He was given Zofran and morphine for significant abdominal pain.     He came to require Bentyl for intestinal spasm.  He was treated     with ciprofloxacin and Flagyl for possible infection.  On this     regimen, the patient has slowly improved.  By the time of     discharge, he had been able to take small amounts of food and been     able to hydrate orally.  He still  feeling a little bit weak.  He is     also having some mild abdominal discomfort, cramping with eating     and has had 1 loose stool. With the patient able to take p.o. fluids as well as trying to be able to eat, at this point he is stable for discharge to home.  He will complete an additional 2 days of Flagyl and Cipro.  He is provided with Zofran 4 mg to take at home every 6 hours as needed.  He is provided with a prescription for Bentyl to take 20 mg q.6 h. p.r.n. intestinal spasm.  He is advised to not return to work until Wednesday the 14th if he is feeling well, taking a diet and keeping down fluids.  Given the short term nature of this process, he is not scheduled for hospital followup in the office but will be seen on an as-needed basis. 1. Acute renal insufficiency.  The patient had acute renal     insufficiency with a creatinine of 2.3.  With hydration, his     creatinine stabilized with final value on August 11 of  1.29.  DISCHARGE EXAMINATION:  VITAL SIGNS:  Temperature was 98.2, blood pressure was 114/72, pulse 63, respirations 18, oxygen saturation was 94%. GENERAL APPEARANCE:  This is a heavyset African American gentleman in bed in no acute distress. HEENT:  Conjunctivae and sclerae was clear.  Pupils were equal. CHEST:  The patient is moving air well with no increased work of breathing. CARDIOVASCULAR:  2+ radial pulses.  Precordium was quiet with a regular rate and rhythm.  ABDOMEN:  Mildly obese.  Positive bowel sounds in all 4 quadrants.  Soft.  No guarding or rebound was noted. NEUROLOGIC:  The patient is awake and alert.  Oriented to person, place, time and context.  FINAL LABORATORY:  Final chemistry from August 11 with a sodium of 139, potassium of 4, chloride 109, CO2 of 22, BUN 17, creatinine 1.29, glucose was 87.  Final CBC from day of discharge with a white count of 8500, hemoglobin 13.2 g, platelet count 124,000.  DISPOSITION:  The patient is discharged home.  He will return to work on the 14th.  He is advised to follow up BRAT diet and to make sure he hydrates.  He is to call the office if he has any persistent nausea, vomiting or inability to keep down food, fluids, or medications. The patient's condition at time of discharge dictation is markedly improved.     [name], MD     MEN/MEDQ  D:  12/04/2011  T:  12/04/2011  Job:  [id number]

## 2011-12-04 NOTE — Progress Notes (Signed)
Patient discharged to home with wife.  Discharge teaching completed including follow up care, meds and diet.  Verbalizes understanding with no further questions.  No nausea or vomiting, vital signs stable.

## 2011-12-04 NOTE — Progress Notes (Signed)
Subjective: Doing better, tolerating fluids, some foods.  Objective: Lab: Lab Results  Component Value Date   WBC 8.5 12/04/2011   HGB 13.2 12/04/2011   HCT 38.7* 12/04/2011   MCV 87.8 12/04/2011   PLT 124* 12/04/2011   BMET    Component Value Date/Time   NA 139 12/03/2011 0547   K 4.0 12/03/2011 0547   CL 109 12/03/2011 0547   CO2 22 12/03/2011 0547   GLUCOSE 87 12/03/2011 0547   BUN 17 12/03/2011 0547   CREATININE 1.29 12/03/2011 0547   CALCIUM 8.3* 12/03/2011 0547   GFRNONAA 64* 12/03/2011 0547   GFRAA 74* 12/03/2011 0547     Imaging:  Scheduled Meds:   . amitriptyline  50 mg Oral QHS  . bismuth subsalicylate  30 mL Oral TID AC & HS  . ciprofloxacin  400 mg Intravenous Q24H  . dicyclomine  20 mg Oral BID  . ezetimibe-simvastatin  1 tablet Oral QHS  . levothyroxine  25 mcg Oral QAC breakfast  . metronidazole  500 mg Intravenous Q8H  . pantoprazole (PROTONIX) IV  40 mg Intravenous QHS  . sertraline  50 mg Oral Daily   Continuous Infusions:   . 0.9 % sodium chloride with kcl 150 mL/hr at 12/04/11 0209   PRN Meds:.acetaminophen, acetaminophen, alum & mag hydroxide-simeth, diphenhydrAMINE, morphine injection, ondansetron (ZOFRAN) IV   Physical Exam: Filed Vitals:   12/04/11 0520  BP: 114/72  Pulse: 63  Temp: 98.2 F (36.8 C)  Resp: 18   Cor- RRR Pulm - normal respirations Abd- BS+, soft     Assessment/Plan: D/C home. Dictated # [id number]   Illene Regulus Hooks IM Call-grp - [facility] ([phone]; ([phone]  12/04/2011, 7:09 AM

## 2011-12-05 ENCOUNTER — Inpatient Hospital Stay (HOSPITAL_BASED_OUTPATIENT_CLINIC_OR_DEPARTMENT_OTHER)
Admission: EM | Admit: 2011-12-05 | Discharge: 2011-12-16 | DRG: 392 | Disposition: A | Discharge status: Home / Self Care | Payer: Commercial Managed Care - PPO | Attending: Internal Medicine | Admitting: Internal Medicine

## 2011-12-05 ENCOUNTER — Emergency Department (HOSPITAL_BASED_OUTPATIENT_CLINIC_OR_DEPARTMENT_OTHER): Payer: Commercial Managed Care - PPO

## 2011-12-05 ENCOUNTER — Telehealth: Payer: Self-pay | Admitting: Internal Medicine

## 2011-12-05 ENCOUNTER — Encounter (HOSPITAL_BASED_OUTPATIENT_CLINIC_OR_DEPARTMENT_OTHER): Payer: Self-pay

## 2011-12-05 DIAGNOSIS — G500 Trigeminal neuralgia: Secondary | ICD-10-CM | POA: Diagnosis present

## 2011-12-05 DIAGNOSIS — K529 Noninfective gastroenteritis and colitis, unspecified: Secondary | ICD-10-CM

## 2011-12-05 DIAGNOSIS — D72829 Elevated white blood cell count, unspecified: Secondary | ICD-10-CM | POA: Diagnosis present

## 2011-12-05 DIAGNOSIS — E785 Hyperlipidemia, unspecified: Secondary | ICD-10-CM | POA: Diagnosis present

## 2011-12-05 DIAGNOSIS — J45909 Unspecified asthma, uncomplicated: Secondary | ICD-10-CM | POA: Diagnosis present

## 2011-12-05 DIAGNOSIS — E876 Hypokalemia: Secondary | ICD-10-CM | POA: Diagnosis present

## 2011-12-05 DIAGNOSIS — Z7982 Long term (current) use of aspirin: Secondary | ICD-10-CM

## 2011-12-05 DIAGNOSIS — K5289 Other specified noninfective gastroenteritis and colitis: Secondary | ICD-10-CM

## 2011-12-05 DIAGNOSIS — D696 Thrombocytopenia, unspecified: Secondary | ICD-10-CM

## 2011-12-05 DIAGNOSIS — R1110 Vomiting, unspecified: Secondary | ICD-10-CM

## 2011-12-05 DIAGNOSIS — N289 Disorder of kidney and ureter, unspecified: Secondary | ICD-10-CM | POA: Diagnosis present

## 2011-12-05 DIAGNOSIS — E039 Hypothyroidism, unspecified: Secondary | ICD-10-CM | POA: Diagnosis present

## 2011-12-05 DIAGNOSIS — I517 Cardiomegaly: Secondary | ICD-10-CM | POA: Diagnosis present

## 2011-12-05 DIAGNOSIS — I10 Essential (primary) hypertension: Secondary | ICD-10-CM | POA: Diagnosis present

## 2011-12-05 DIAGNOSIS — K219 Gastro-esophageal reflux disease without esophagitis: Secondary | ICD-10-CM | POA: Diagnosis present

## 2011-12-05 DIAGNOSIS — R197 Diarrhea, unspecified: Secondary | ICD-10-CM

## 2011-12-05 DIAGNOSIS — F17200 Nicotine dependence, unspecified, uncomplicated: Secondary | ICD-10-CM | POA: Diagnosis present

## 2011-12-05 DIAGNOSIS — Z79899 Other long term (current) drug therapy: Secondary | ICD-10-CM

## 2011-12-05 DIAGNOSIS — R1115 Cyclical vomiting syndrome unrelated to migraine: Principal | ICD-10-CM | POA: Diagnosis present

## 2011-12-05 DIAGNOSIS — E042 Nontoxic multinodular goiter: Secondary | ICD-10-CM | POA: Diagnosis present

## 2011-12-05 DIAGNOSIS — E860 Dehydration: Secondary | ICD-10-CM | POA: Diagnosis present

## 2011-12-05 DIAGNOSIS — N200 Calculus of kidney: Secondary | ICD-10-CM | POA: Diagnosis present

## 2011-12-05 DIAGNOSIS — R109 Unspecified abdominal pain: Secondary | ICD-10-CM

## 2011-12-05 DIAGNOSIS — R112 Nausea with vomiting, unspecified: Secondary | ICD-10-CM

## 2011-12-05 DIAGNOSIS — R634 Abnormal weight loss: Secondary | ICD-10-CM | POA: Diagnosis present

## 2011-12-05 DIAGNOSIS — H538 Other visual disturbances: Secondary | ICD-10-CM | POA: Diagnosis not present

## 2011-12-05 DIAGNOSIS — N179 Acute kidney failure, unspecified: Secondary | ICD-10-CM

## 2011-12-05 LAB — CBC WITH DIFFERENTIAL/PLATELET
Basophils Absolute: 0 10*3/uL (ref 0.0–0.1)
Basophils Relative: 0 % (ref 0–1)
Eosinophils Absolute: 0 10*3/uL (ref 0.0–0.7)
Eosinophils Relative: 0 % (ref 0–5)
HCT: 44.8 % (ref 39.0–52.0)
Hemoglobin: 16.2 g/dL (ref 13.0–17.0)
Lymphocytes Relative: 14 % (ref 12–46)
Lymphs Abs: 2.1 10*3/uL (ref 0.7–4.0)
MCH: 30.7 pg (ref 26.0–34.0)
MCHC: 36.2 g/dL — ABNORMAL HIGH (ref 30.0–36.0)
MCV: 84.8 fL (ref 78.0–100.0)
Monocytes Absolute: 0.7 10*3/uL (ref 0.1–1.0)
Monocytes Relative: 5 % (ref 3–12)
Neutro Abs: 12.2 10*3/uL — ABNORMAL HIGH (ref 1.7–7.7)
Neutrophils Relative %: 81 % — ABNORMAL HIGH (ref 43–77)
Platelets: 113 10*3/uL — ABNORMAL LOW (ref 150–400)
RBC: 5.28 MIL/uL (ref 4.22–5.81)
RDW: 12.2 % (ref 11.5–15.5)
WBC: 15 10*3/uL — ABNORMAL HIGH (ref 4.0–10.5)

## 2011-12-05 LAB — COMPREHENSIVE METABOLIC PANEL
ALT: 12 U/L (ref 0–53)
AST: 18 U/L (ref 0–37)
Albumin: 3.9 g/dL (ref 3.5–5.2)
Alkaline Phosphatase: 49 U/L (ref 39–117)
BUN: 8 mg/dL (ref 6–23)
CO2: 24 mEq/L (ref 19–32)
Calcium: 9.3 mg/dL (ref 8.4–10.5)
Chloride: 103 mEq/L (ref 96–112)
Creatinine, Ser: 1.1 mg/dL (ref 0.50–1.35)
GFR calc Af Amer: >90 mL/min (ref >90)
GFR calc non Af Amer: 78 mL/min — ABNORMAL LOW (ref >90)
Glucose, Bld: 121 mg/dL — ABNORMAL HIGH (ref 70–99)
Potassium: 3.6 mEq/L (ref 3.5–5.1)
Sodium: 140 mEq/L (ref 135–145)
Total Bilirubin: 0.3 mg/dL (ref 0.3–1.2)
Total Protein: 7.2 g/dL (ref 6.0–8.3)

## 2011-12-05 LAB — URINALYSIS, ROUTINE W REFLEX MICROSCOPIC
Bilirubin Urine: NEGATIVE
Glucose, UA: NEGATIVE mg/dL
Hgb urine dipstick: NEGATIVE
Ketones, ur: 15 mg/dL — AB
Leukocytes, UA: NEGATIVE
Nitrite: NEGATIVE
Protein, ur: NEGATIVE mg/dL
Specific Gravity, Urine: 1.019 (ref 1.005–1.030)
Urobilinogen, UA: 0.2 mg/dL (ref 0.0–1.0)
pH: 5.5 (ref 5.0–8.0)

## 2011-12-05 LAB — FECAL LACTOFERRIN, QUANT: Fecal Lactoferrin: NEGATIVE

## 2011-12-05 LAB — STOOL CULTURE

## 2011-12-05 LAB — PROCALCITONIN: Procalcitonin: <0.1 ng/mL

## 2011-12-05 LAB — LIPASE, BLOOD: Lipase: 26 U/L (ref 11–59)

## 2011-12-05 LAB — LACTIC ACID, PLASMA: Lactic Acid, Venous: 1.6 mmol/L (ref 0.5–2.2)

## 2011-12-05 MED ORDER — HYDROMORPHONE HCL PF 1 MG/ML IJ SOLN
1.0000 mg | INTRAMUSCULAR | Status: DC | PRN
  Administered 2011-12-06 (×2): 1 mg via INTRAVENOUS
  Filled 2011-12-05 (×2): qty 1

## 2011-12-05 MED ORDER — SODIUM CHLORIDE 0.9 % IV SOLN: INTRAVENOUS | Status: DC

## 2011-12-05 MED ORDER — LORAZEPAM 2 MG/ML IJ SOLN
1.0000 mg | Freq: Once | INTRAMUSCULAR | Status: AC
  Administered 2011-12-05: 1 mg via INTRAVENOUS

## 2011-12-05 MED ORDER — PROMETHAZINE HCL 25 MG/ML IJ SOLN
25.0000 mg | Freq: Once | INTRAMUSCULAR | Status: AC
  Administered 2011-12-05: 25 mg via INTRAVENOUS
  Filled 2011-12-05: qty 1

## 2011-12-05 MED ORDER — SODIUM CHLORIDE 0.9 % IV SOLN
Freq: Once | INTRAVENOUS | Status: AC
  Administered 2011-12-05: 21:00:00 via INTRAVENOUS

## 2011-12-05 MED ORDER — HYDROMORPHONE HCL PF 1 MG/ML IJ SOLN
1.0000 mg | Freq: Once | INTRAMUSCULAR | Status: AC
  Administered 2011-12-05: 1 mg via INTRAVENOUS
  Filled 2011-12-05: qty 1

## 2011-12-05 MED ORDER — ONDANSETRON HCL 4 MG/2ML IJ SOLN
4.0000 mg | Freq: Once | INTRAMUSCULAR | Status: AC
  Administered 2011-12-05: 4 mg via INTRAVENOUS
  Filled 2011-12-05: qty 2

## 2011-12-05 MED ORDER — LORAZEPAM 2 MG/ML IJ SOLN
INTRAMUSCULAR | Status: AC
  Filled 2011-12-05: qty 1

## 2011-12-05 MED ORDER — IOHEXOL 300 MG/ML  SOLN
100.0000 mL | Freq: Once | INTRAMUSCULAR | Status: AC | PRN
  Administered 2011-12-05: 100 mL via INTRAVENOUS

## 2011-12-05 MED ORDER — ONDANSETRON HCL 4 MG/2ML IJ SOLN
4.0000 mg | Freq: Three times a day (TID) | INTRAMUSCULAR | Status: DC | PRN
  Administered 2011-12-05: 4 mg via INTRAVENOUS
  Filled 2011-12-05: qty 2

## 2011-12-05 MED ORDER — CIPROFLOXACIN IN D5W 400 MG/200ML IV SOLN
400.0000 mg | Freq: Two times a day (BID) | INTRAVENOUS | Status: DC
  Administered 2011-12-06 – 2011-12-08 (×6): 400 mg via INTRAVENOUS
  Filled 2011-12-05 (×8): qty 200

## 2011-12-05 MED ORDER — SODIUM CHLORIDE 0.9 % IV BOLUS (SEPSIS)
1000.0000 mL | Freq: Once | INTRAVENOUS | Status: AC
  Administered 2011-12-05: 1000 mL via INTRAVENOUS

## 2011-12-05 MED ORDER — HYDROMORPHONE HCL PF 1 MG/ML IJ SOLN
1.0000 mg | INTRAMUSCULAR | Status: DC | PRN
  Administered 2011-12-05: 1 mg via INTRAVENOUS
  Filled 2011-12-05: qty 1

## 2011-12-05 MED ORDER — DIPHENHYDRAMINE HCL 50 MG/ML IJ SOLN
25.0000 mg | Freq: Once | INTRAMUSCULAR | Status: AC
  Administered 2011-12-05: 25 mg via INTRAVENOUS
  Filled 2011-12-05: qty 1

## 2011-12-05 MED ORDER — ONDANSETRON HCL 4 MG/2ML IJ SOLN: 4.0000 mg | Freq: Four times a day (QID) | INTRAMUSCULAR | Status: DC | PRN

## 2011-12-05 MED ORDER — METOCLOPRAMIDE HCL 5 MG/ML IJ SOLN
10.0000 mg | Freq: Once | INTRAMUSCULAR | Status: AC
  Administered 2011-12-05: 10 mg via INTRAVENOUS
  Filled 2011-12-05: qty 2

## 2011-12-05 MED ORDER — ACETAMINOPHEN 325 MG PO TABS: 650.0000 mg | ORAL_TABLET | Freq: Four times a day (QID) | ORAL | Status: DC | PRN

## 2011-12-05 MED ORDER — METRONIDAZOLE IN NACL 5-0.79 MG/ML-% IV SOLN
500.0000 mg | Freq: Three times a day (TID) | INTRAVENOUS | Status: DC
  Administered 2011-12-06 – 2011-12-08 (×8): 500 mg via INTRAVENOUS
  Filled 2011-12-05 (×10): qty 100

## 2011-12-05 MED ORDER — LABETALOL HCL 5 MG/ML IV SOLN
10.0000 mg | INTRAVENOUS | Status: DC | PRN
  Administered 2011-12-08: 10 mg via INTRAVENOUS
  Filled 2011-12-05: qty 4

## 2011-12-05 MED ORDER — ONDANSETRON HCL 4 MG PO TABS: 4.0000 mg | ORAL_TABLET | Freq: Four times a day (QID) | ORAL | Status: DC | PRN

## 2011-12-05 MED ORDER — DEXTROSE-NACL 5-0.45 % IV SOLN
INTRAVENOUS | Status: DC
  Administered 2011-12-06: 03:00:00 via INTRAVENOUS

## 2011-12-05 MED ORDER — ACETAMINOPHEN 650 MG RE SUPP: 650.0000 mg | Freq: Four times a day (QID) | RECTAL | Status: DC | PRN

## 2011-12-05 NOTE — H&P (Signed)
[name] is an 51 y.o. male.   Patient seen and examined on December 05, 2011 at 10:45 PM. PCP - Dr. Oliver Barre. Chief Complaint: Abdominal pain with nausea vomiting and diarrhea. HPI: 51 year-old male who was just discharged yesterday presents with complaints of recurrence of his nausea vomiting diarrhea with abdominal cramps and pain. Patient was initially admitted on August 8 with nausea vomiting and diarrhea abdominal pain features compatible with food poisoning/enteritis after eating tuna and crab dip at a food show in [place]. At that time patient also was in renal failure which improved with hydration. Patient at time of discharge was able to tolerate oral diet. But since yesterday morning he started developing nausea vomiting and multiple episodes of diarrhea but denies abdominal pain more of a crampy nature. He presented to the ER in [facility] and over there CT abdomen and pelvis shows features of enteritis. Patient in addition also has mild thrombocytopenia with leukocytosis. Patient is afebrile. After admission patient has had at least 4 loose bowel movements which are yellow in color and one episode of vomiting with some blood-tinged.   Past Medical History  Diagnosis Date  . Hypertension   . Unspecified intestinal obstruction   . Unspecified asthma   . Chest pain, atypical   . Pulmonary nodule   . Nontoxic multinodular goiter   . Painful respiration   . Dyspnea on exertion   . Trigeminal neuralgia   . Cluster headache   . Hyperlipidemia   . GERD (gastroesophageal reflux disease)   . Complication of anesthesia     difficult waking up"    Past Surgical History  Procedure Date  . Appendectomy   . Knee arthroscopy   . 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 Right shoulder redo-reconstruction 06/21/10  . Destruction trigeminal nerve via neurolytic agent     Family History  Problem Relation Age of Onset  . Diabetes Mother    . Hyperlipidemia Mother   . Hypertension Mother   . Hypertension Father   . Heart disease Father     CAD/CHF  . Cancer Father     Mesothelioma  . Heart disease Maternal Aunt   . Prostate cancer Neg Hx   . Colon cancer Neg Hx    Social History:  reports that he has been smoking Cigarettes.  He has a 4.5 pack-year smoking history. He has never used smokeless tobacco. He reports that he drinks alcohol. He reports that he does not use illicit drugs.  Allergies: No Known Allergies  Medications Prior to Admission  Medication Sig Dispense Refill  . ciprofloxacin (CIPRO) 500 MG tablet Take 1 tablet (500 mg total) by mouth 2 (two) times daily.  6 tablet  0  . dicyclomine (BENTYL) 20 MG tablet Take 1 tablet (20 mg total) by mouth 2 (two) times daily.  10 tablet  0  . metroNIDAZOLE (FLAGYL) 500 MG tablet Take 1 tablet (500 mg total) by mouth 3 (three) times daily.  9 tablet  0  . ondansetron (ZOFRAN) 4 MG tablet Take 1 tablet (4 mg total) by mouth every 8 (eight) hours as needed for nausea.  20 tablet  0  . amitriptyline (ELAVIL) 25 MG tablet Take 50 mg by mouth daily.      Marland Kitchen aspirin 81 MG EC tablet Take 81 mg by mouth daily.        . cetirizine-pseudoephedrine (ZYRTEC-D) 5-120 MG per tablet Take 1 tablet by mouth  2 (two) times daily.        Marland Kitchen diltiazem (TIAZAC) 240 MG 24 hr capsule Take 1 capsule (240 mg total) by mouth daily.  30 capsule  11  . lansoprazole (PREVACID) 30 MG capsule TAKE ONE CAPSULE BY MOUTH DAILY  30 capsule  5  . levothyroxine (SYNTHROID, LEVOTHROID) 25 MCG tablet TAKE 1 TABLET BY MOUTH DAILY  30 tablet  2  . sertraline (ZOLOFT) 50 MG tablet Take 1 tablet (50 mg total) by mouth daily.  30 tablet  6  . VYTORIN 10-80 MG per tablet TAKE ONE TABLET BY MOUTH DAILY  30 tablet  11    Results for orders placed during the hospital encounter of 12/05/11 (from the past 48 hour(s))  LACTIC ACID, PLASMA     Status: Normal   Collection Time   12/05/11  3:24 PM      Component Value  Range Comment   Lactic Acid, Venous 1.6  0.5 - 2.2 mmol/L   CBC WITH DIFFERENTIAL     Status: Abnormal   Collection Time   12/05/11  3:24 PM      Component Value Range Comment   WBC 15.0 (*) 4.0 - 10.5 K/uL    RBC 5.28  4.22 - 5.81 MIL/uL    Hemoglobin 16.2  13.0 - 17.0 g/dL    HCT 21.3  08.6 - 57.8 %    MCV 84.8  78.0 - 100.0 fL    MCH 30.7  26.0 - 34.0 pg    MCHC 36.2 (*) 30.0 - 36.0 g/dL    RDW 46.9  62.9 - 52.8 %    Platelets 113 (*) 150 - 400 K/uL    Neutrophils Relative 81 (*) 43 - 77 %    Neutro Abs 12.2 (*) 1.7 - 7.7 K/uL    Lymphocytes Relative 14  12 - 46 %    Lymphs Abs 2.1  0.7 - 4.0 K/uL    Monocytes Relative 5  3 - 12 %    Monocytes Absolute 0.7  0.1 - 1.0 K/uL    Eosinophils Relative 0  0 - 5 %    Eosinophils Absolute 0.0  0.0 - 0.7 K/uL    Basophils Relative 0  0 - 1 %    Basophils Absolute 0.0  0.0 - 0.1 K/uL   COMPREHENSIVE METABOLIC PANEL     Status: Abnormal   Collection Time   12/05/11  3:24 PM      Component Value Range Comment   Sodium 140  135 - 145 mEq/L    Potassium 3.6  3.5 - 5.1 mEq/L    Chloride 103  96 - 112 mEq/L    CO2 24  19 - 32 mEq/L    Glucose, Bld 121 (*) 70 - 99 mg/dL    BUN 8  6 - 23 mg/dL    Creatinine, Ser 4.13  0.50 - 1.35 mg/dL    Calcium 9.3  8.4 - 24.4 mg/dL    Total Protein 7.2  6.0 - 8.3 g/dL    Albumin 3.9  3.5 - 5.2 g/dL    AST 18  0 - 37 U/L    ALT 12  0 - 53 U/L    Alkaline Phosphatase 49  39 - 117 U/L    Total Bilirubin 0.3  0.3 - 1.2 mg/dL    GFR calc non Af Amer 78 (*) >90 mL/min    GFR calc Af Amer >90  >90 mL/min   LIPASE, BLOOD  Status: Normal   Collection Time   12/05/11  3:24 PM      Component Value Range Comment   Lipase 26  11 - 59 U/L   PROCALCITONIN     Status: Normal   Collection Time   12/05/11  3:25 PM      Component Value Range Comment   Procalcitonin <0.10     URINALYSIS, ROUTINE W REFLEX MICROSCOPIC     Status: Abnormal   Collection Time   12/05/11  4:51 PM      Component Value Range Comment     Color, Urine YELLOW  YELLOW    APPearance CLEAR  CLEAR    Specific Gravity, Urine 1.019  1.005 - 1.030    pH 5.5  5.0 - 8.0    Glucose, UA NEGATIVE  NEGATIVE mg/dL    Hgb urine dipstick NEGATIVE  NEGATIVE    Bilirubin Urine NEGATIVE  NEGATIVE    Ketones, ur 15 (*) NEGATIVE mg/dL    Protein, ur NEGATIVE  NEGATIVE mg/dL    Urobilinogen, UA 0.2  0.0 - 1.0 mg/dL    Nitrite NEGATIVE  NEGATIVE    Leukocytes, UA NEGATIVE  NEGATIVE MICROSCOPIC NOT DONE ON URINES WITH NEGATIVE PROTEIN, BLOOD, LEUKOCYTES, NITRITE, OR GLUCOSE <1000 mg/dL.   Ct Abdomen Pelvis W Contrast  12/05/2011  *RADIOLOGY REPORT*  Clinical Data: Abdominal pain, nausea.  CT ABDOMEN AND PELVIS WITH CONTRAST  Technique:  Multidetector CT imaging of the abdomen and pelvis was performed following the standard protocol during bolus administration of intravenous contrast.  Contrast: OMNIPAQUE IOHEXOL 300 MG/ML  SOLN  Comparison: 12/01/2011  Findings: Mild dependent atelectasis in the visualized lung bases. There is trace perihepatic ascites.  Unremarkable liver, nondistended gallbladder, spleen, adrenal glands, pancreas.  Left nephrolithiasis, single stone or cluster in the upper pole measuring 6 mm.  Probable exophytic left renal cysts, largest 2.1 cm from the upper pole.  No hydronephrosis or ureterectasis.  No ureteral calculus.  Urinary bladder incompletely distended.  No ascites.  No free air.  The stomach is nondilated. There is mild circumferential wall thickening suspected in multiple mid and distal small bowel loops, assessment somewhat limited due to lack of any oral contrast material. Appendix not seen.  The colon is nondilated, with a few scattered descending diverticula.  Bilateral pelvic phleboliths.  No pelvic, retroperitoneal, or mesenteric adenopathy.  Portal vein patent.  Patchy aortoiliac arterial calcifications without aneurysm. Small umbilical hernia containing only mesenteric fat.  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  IMPRESSION:  1.  Suspect mild circumferential wall thickening of multiple loops of mid and distal small bowel without dilatation or significant adjacent inflammatory/edematous change, suggesting enteritis more likely than ischemia. 2.  Left nephrolithiasis without hydronephrosis.  Original Report Authenticated By: Osa Craver, M.D.   Dg Abd Acute W/chest  12/05/2011  *RADIOLOGY REPORT*  Clinical Data: Abdominal pain, nausea, vomiting, diarrhea  ACUTE ABDOMEN SERIES (ABDOMEN 2 VIEW & CHEST 1 VIEW)  Comparison: Chest and acute abdomen of 12/01/2011 and CT abdomen pelvis of the same.  Findings: Mild volume loss is present at the right lung base.  No infiltrate or effusion is seen.  The heart is mildly enlarged and stable.  A prosthetic right humeral head is noted.  Supine and erect views the abdomen show a paucity of bowel gas.  No free air is seen.  A partial small bowel obstruction cannot be excluded.  A left renal calculus again is noted.  IMPRESSION:  1. Mild cardiomegaly.  No active lung disease. 2.  Paucity of bowel gas.  Cannot exclude small bowel obstruction with fluid-filled bowel.  No free air. 3.  Left renal calculus.  Original Report Authenticated By: Juline Patch, M.D.    Review of Systems  Constitutional: Negative.   HENT: Negative.   Eyes: Negative.   Respiratory: Negative.   Cardiovascular: Negative.   Gastrointestinal: Positive for nausea, vomiting, abdominal pain and diarrhea.  Genitourinary: Negative.   Musculoskeletal: Negative.   Skin: Negative.   Neurological: Negative.   Endo/Heme/Allergies: Negative.   Psychiatric/Behavioral: Negative.     Blood pressure 145/86, pulse 77, temperature 98.9 F (37.2 C), temperature source Oral, resp. rate 20, height 5\' 10"  (1.778 m), weight 101.2 kg (223 lb 1.7 oz), SpO2 99.00%. Physical Exam  Constitutional: He is oriented to person, place, and time. He appears well-developed and well-nourished. No distress.    HENT:  Head: Normocephalic and atraumatic.  Right Ear: External ear normal.  Left Ear: External ear normal.  Nose: Nose normal.  Mouth/Throat: Oropharynx is clear and moist. No oropharyngeal exudate.  Eyes: Conjunctivae are normal. Pupils are equal, round, and reactive to light. Right eye exhibits no discharge. Left eye exhibits no discharge. No scleral icterus.  Neck: Normal range of motion. Neck supple.  Cardiovascular: Normal rate and regular rhythm.   Respiratory: Effort normal and breath sounds normal. No respiratory distress. He has no wheezes. He has no rales.  GI: Soft. Bowel sounds are normal. He exhibits no distension. There is tenderness. There is no rebound and no guarding.  Musculoskeletal: Normal range of motion. He exhibits no edema and no tenderness.  Neurological: He is alert and oriented to person, place, and time.       Moves all extremities.  Skin: Skin is warm and dry. He is not diaphoretic.  Psychiatric: His behavior is normal.     Assessment/Plan #1. Persistent nausea vomiting abdominal pain most likely from infectious enteritis - we will continue with IV Cipro and Flagyl. Recheck stool culture and C. difficile. Patient will be kept n.p.o. for now as patient is to have nausea vomiting. Aggressively hydrate. #2. Mild thrombocytopenia - closely follow CBC for the platelet trend. I have ordered peripheral smear looking for any schistocytes. #3. Hypertension -patient will be at this time placed on when necessary IV labetalol for systolic blood pressure more than 180 until patient can take his regular by mouth medicines. #4. Hypothyroidism - continue Synthroid when patient can take orally.  CODE STATUS - full code.  Patient is admitted under Dr. Oliver Barre service.  Axelle Szwed N. 12/05/2011, 11:30 PM

## 2011-12-05 NOTE — ED Provider Notes (Addendum)
Care assumed at the change of shift. Recently admitted for N/V/D, felt to be food poisoning. Went home yesterday. Symptoms returned and worsened today. Pending CT to rule out obstruction but plan to admit.   Pt unable to tolerate NG tube or oral contrast. CT shows enteritis, but no SBO. Admit to hospitalist.   [name]. Jeffrey Mayers, MD 12/05/11 1610

## 2011-12-05 NOTE — ED Notes (Signed)
Pt resting on stretcher

## 2011-12-05 NOTE — ED Notes (Signed)
[facility] has been given the bed --[id number]

## 2011-12-05 NOTE — Telephone Encounter (Signed)
Caller: Bonnie/Spouse; Patient Name: [name]; PCP: Illene Regulus; Best Callback Phone Number: [phone].  Released from hospital  on 08/12, after 4 day treatment for food poisoning.  Diarrhea and vomiting have returned on 08/13, is "worse than before."   Spouse is calling only to advise Illene Regulus that they are on the way back to [facility] Emergency Room.  Would appreciate a call from Doctor Norins some time today if possible.

## 2011-12-05 NOTE — ED Notes (Signed)
Notified dr Preston Fleeting of pts arrival and history of being here being admitted to [facility] and discharged yesterday

## 2011-12-05 NOTE — ED Notes (Signed)
N/v/d, abd pain-was admitted last week for same-d/c yesterday-c/o same c/o started this am

## 2011-12-05 NOTE — ED Provider Notes (Addendum)
History     CSN: [id number]  Arrival date & time 12/05/11  1131   First MD Initiated Contact with Patient 12/05/11 1231      Chief Complaint  Patient presents with  . Abdominal Pain    (Consider location/radiation/quality/duration/timing/severity/associated sxs/prior treatment) Patient is a 51 y.o. male presenting with abdominal pain. The history is provided by the patient and the spouse.  Abdominal Pain The primary symptoms of the illness include abdominal pain.  He started getting sick 7 days ago with nausea, vomiting, diarrhea and abdominal cramping. He was seen in emergency Department 5 days ago and admitted to the hospital with dehydration and renal insufficiency. He was evaluated in the hospital by a gastroenterologist and had CT scans and was diagnosed with staphylococcal food poisoning. He was doing better as of yesterday with vomiting having ceased and cramping improved with still ongoing diarrhea. He was sent home with prescriptions for antibiotics. This morning, he took his doses of antibiotics and Bentyl and had recurrence of severe abdominal cramping and nausea and vomiting. Diarrhea has continued unchanged. Diarrhea is watery. Abdominal pain is severe and he rates it 10/10. He denies fever, chills, sweats. He has had abdominal surgery including appendectomy and surgery to relieve a blockage.  Past Medical History  Diagnosis Date  . Hypertension   . Unspecified intestinal obstruction   . Unspecified asthma   . Chest pain, atypical   . Pulmonary nodule   . Nontoxic multinodular goiter   . Painful respiration   . Dyspnea on exertion   . Trigeminal neuralgia   . Cluster headache   . Hyperlipidemia   . GERD (gastroesophageal reflux disease)   . Complication of anesthesia     difficult waking up"    Past Surgical History  Procedure Date  . Appendectomy   . Knee arthroscopy   . 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 Right shoulder  redo-reconstruction 06/21/10  . Destruction trigeminal nerve via neurolytic agent     Family History  Problem Relation Age of Onset  . Diabetes Mother   . Hyperlipidemia Mother   . Hypertension Mother   . Hypertension Father   . Heart disease Father     CAD/CHF  . Cancer Father     Mesothelioma  . Heart disease Maternal Aunt   . Prostate cancer Neg Hx   . Colon cancer Neg Hx     History  Substance Use Topics  . Smoking status: Current Everyday Smoker -- 0.2 packs/day for 18 years    Types: Cigarettes  . Smokeless tobacco: Never Used   Comment: 1-2 cigs per day, started age 47  . Alcohol Use: Yes     rare      Review of Systems  Gastrointestinal: Positive for abdominal pain.  All other systems reviewed and are negative.    Allergies  Review of patient's allergies indicates no known allergies.  Home Medications   Current Outpatient Rx  Name Route Sig Dispense Refill  . AMITRIPTYLINE HCL 25 MG PO TABS Oral Take 50 mg by mouth daily.    . ASPIRIN 81 MG PO TBEC Oral Take 81 mg by mouth daily.      Marland Kitchen CETIRIZINE-PSEUDOEPHEDRINE ER 5-120 MG PO TB12 Oral Take 1 tablet by mouth 2 (two) times daily.      Marland Kitchen CIPROFLOXACIN HCL 500 MG PO TABS Oral Take 1 tablet (500 mg total) by mouth 2 (two) times daily. 6 tablet 0  .  DICYCLOMINE HCL 20 MG PO TABS Oral Take 1 tablet (20 mg total) by mouth 2 (two) times daily. 10 tablet 0  . DILTIAZEM HCL ER BEADS 240 MG PO CP24 Oral Take 1 capsule (240 mg total) by mouth daily. 30 capsule 11  . LANSOPRAZOLE 30 MG PO CPDR  TAKE ONE CAPSULE BY MOUTH DAILY 30 capsule 5  . LEVOTHYROXINE SODIUM 25 MCG PO TABS  TAKE 1 TABLET BY MOUTH DAILY 30 tablet 2    Patient needs to make appointment for labs and off ...  . METRONIDAZOLE 500 MG PO TABS Oral Take 1 tablet (500 mg total) by mouth 3 (three) times daily. 9 tablet 0  . ONDANSETRON HCL 4 MG PO TABS Oral Take 1 tablet (4 mg total) by mouth every 8 (eight) hours as needed for nausea. 20 tablet 0  .  SERTRALINE HCL 50 MG PO TABS Oral Take 1 tablet (50 mg total) by mouth daily. 30 tablet 6  . VYTORIN 10-80 MG PO TABS  TAKE ONE TABLET BY MOUTH DAILY 30 tablet 11    CYCLE FILL MEDICATION. Authorization is required f ...    Pulse 77  Resp 20  Ht 5\' 10"  (1.778 m)  Wt 231 lb (104.781 kg)  BMI 33.15 kg/m2  SpO2 98%  Physical Exam  Nursing note and vitals reviewed. 51year old male, who appears uncomfortable. Vital signs are normal. Oxygen saturation is 98%, which is normal. Head is normocephalic and atraumatic. PERRLA, EOMI. Oropharynx is clear. Neck is nontender and supple without adenopathy or JVD. Back is nontender and there is no CVA tenderness. Lungs are clear without rales, wheezes, or rhonchi. Chest is nontender. Heart has regular rate and rhythm without murmur. Abdomen is soft, with diffuse tenderness. No rebound or guarding. Right lower quadrant and periumbilical scars are present and there is a small umbilical hernia which is easily reducible. Peristalsis is decreased.. Extremities have no cyanosis or edema, full range of motion is present. Skin is warm and dry without rash. Neurologic: Mental status is normal, cranial nerves are intact, there are no motor or sensory deficits.   ED Course  Procedures (including critical care time)  Results for orders placed during the hospital encounter of 12/05/11  LACTIC ACID, PLASMA      Component Value Range   Lactic Acid, Venous 1.6  0.5 - 2.2 mmol/L  CBC WITH DIFFERENTIAL      Component Value Range   WBC 15.0 (*) 4.0 - 10.5 K/uL   RBC 5.28  4.22 - 5.81 MIL/uL   Hemoglobin 16.2  13.0 - 17.0 g/dL   HCT 16.1  09.6 - 04.5 %   MCV 84.8  78.0 - 100.0 fL   MCH 30.7  26.0 - 34.0 pg   MCHC 36.2 (*) 30.0 - 36.0 g/dL   RDW 40.9  81.1 - 91.4 %   Platelets 113 (*) 150 - 400 K/uL   Neutrophils Relative 81 (*) 43 - 77 %   Neutro Abs 12.2 (*) 1.7 - 7.7 K/uL   Lymphocytes Relative 14  12 - 46 %   Lymphs Abs 2.1  0.7 - 4.0 K/uL   Monocytes  Relative 5  3 - 12 %   Monocytes Absolute 0.7  0.1 - 1.0 K/uL   Eosinophils Relative 0  0 - 5 %   Eosinophils Absolute 0.0  0.0 - 0.7 K/uL   Basophils Relative 0  0 - 1 %   Basophils Absolute 0.0  0.0 - 0.1 K/uL  COMPREHENSIVE METABOLIC PANEL      Component Value Range   Sodium 140  135 - 145 mEq/L   Potassium 3.6  3.5 - 5.1 mEq/L   Chloride 103  96 - 112 mEq/L   CO2 24  19 - 32 mEq/L   Glucose, Bld 121 (*) 70 - 99 mg/dL   BUN 8  6 - 23 mg/dL   Creatinine, Ser 1.61  0.50 - 1.35 mg/dL   Calcium 9.3  8.4 - 09.6 mg/dL   Total Protein 7.2  6.0 - 8.3 g/dL   Albumin 3.9  3.5 - 5.2 g/dL   AST 18  0 - 37 U/L   ALT 12  0 - 53 U/L   Alkaline Phosphatase 49  39 - 117 U/L   Total Bilirubin 0.3  0.3 - 1.2 mg/dL   GFR calc non Af Amer 78 (*) >90 mL/min   GFR calc Af Amer >90  >90 mL/min  LIPASE, BLOOD      Component Value Range   Lipase 26  11 - 59 U/L   Dg Abd Acute W/chest  12/05/2011  *RADIOLOGY REPORT*  Clinical Data: Abdominal pain, nausea, vomiting, diarrhea  ACUTE ABDOMEN SERIES (ABDOMEN 2 VIEW & CHEST 1 VIEW)  Comparison: Chest and acute abdomen of 12/01/2011 and CT abdomen pelvis of the same.  Findings: Mild volume loss is present at the right lung base.  No infiltrate or effusion is seen.  The heart is mildly enlarged and stable.  A prosthetic right humeral head is noted.  Supine and erect views the abdomen show a paucity of bowel gas.  No free air is seen.  A partial small bowel obstruction cannot be excluded.  A left renal calculus again is noted.  IMPRESSION:  1. Mild cardiomegaly.  No active lung disease. 2.  Paucity of bowel gas.  Cannot exclude small bowel obstruction with fluid-filled bowel.  No free air. 3.  Left renal calculus.  Original Report Authenticated By: Juline Patch, M.D.     1. Abdominal  pain, other specified site   2. Vomiting and diarrhea       MDM  Recurrent abdominal pain, vomiting, diarrhea. Prior records were reviewed and he had been diagnosed with  staphylococcal food poisoning. At this point, I do not feel his ongoing symptoms are consistent with that diagnosis as staphylococcal food poisoning is typically short-lived. CT scan showed questionable cystitis, but was done without contrast. Laboratory workup will be done and plain films obtained to evaluate for possible bowel obstruction. If abdominal x-ray is unremarkable and renal function is satisfactory, we'll consider repeating CT scan with contrast.  Renal function is normal. Abdominal x-rays were significant for a paucity of gas and obstruction cannot be reliably determined, so CT scan has been ordered. He continues to have vomiting in spite of antiemetics. Nasogastric tube will be inserted. WBC is noted to have gone back up to 15,000, but lactate is normal. He will clearly need to be readmitted. Case is endorsed to Dr. Bernette Mayers who will evaluate results of CT scan and arrange for hospital admission.    Dione Booze, MD 12/05/11 1627  Dione Booze, MD 12/05/11 [phone]

## 2011-12-05 NOTE — ED Notes (Signed)
Report called to  [facility]

## 2011-12-05 NOTE — ED Notes (Signed)
Report given to Hazel, RN with [facility] 

## 2011-12-05 NOTE — Progress Notes (Signed)
Pt had a small amount of bright red blood in emesis. MD notified.

## 2011-12-05 NOTE — Progress Notes (Signed)
Pt admitted to the unit. Pt is alert and oriented. Pt oriented to room, staff, and call bell. Bed in lowest position. Full assessment to Epic. Call bell with in reach. Told to call for assists. Will continue to monitor.  Faviola Klare E  

## 2011-12-06 ENCOUNTER — Encounter (HOSPITAL_COMMUNITY): Payer: Self-pay | Admitting: Physician Assistant

## 2011-12-06 DIAGNOSIS — R112 Nausea with vomiting, unspecified: Secondary | ICD-10-CM

## 2011-12-06 DIAGNOSIS — R109 Unspecified abdominal pain: Secondary | ICD-10-CM

## 2011-12-06 LAB — COMPREHENSIVE METABOLIC PANEL
ALT: 9 U/L (ref 0–53)
AST: 11 U/L (ref 0–37)
Albumin: 3 g/dL — ABNORMAL LOW (ref 3.5–5.2)
Alkaline Phosphatase: 39 U/L (ref 39–117)
BUN: 10 mg/dL (ref 6–23)
CO2: 23 mEq/L (ref 19–32)
Calcium: 8.6 mg/dL (ref 8.4–10.5)
Chloride: 108 mEq/L (ref 96–112)
Creatinine, Ser: 0.97 mg/dL (ref 0.50–1.35)
GFR calc Af Amer: >90 mL/min (ref >90)
GFR calc non Af Amer: >90 mL/min (ref >90)
Glucose, Bld: 116 mg/dL — ABNORMAL HIGH (ref 70–99)
Potassium: 3.3 mEq/L — ABNORMAL LOW (ref 3.5–5.1)
Sodium: 143 mEq/L (ref 135–145)
Total Bilirubin: 0.4 mg/dL (ref 0.3–1.2)
Total Protein: 5.8 g/dL — ABNORMAL LOW (ref 6.0–8.3)

## 2011-12-06 LAB — CBC WITH DIFFERENTIAL/PLATELET
Basophils Absolute: 0 10*3/uL (ref 0.0–0.1)
Basophils Relative: 0 % (ref 0–1)
Eosinophils Absolute: 0.1 10*3/uL (ref 0.0–0.7)
Eosinophils Relative: 1 % (ref 0–5)
HCT: 39.6 % (ref 39.0–52.0)
Hemoglobin: 14 g/dL (ref 13.0–17.0)
Lymphocytes Relative: 36 % (ref 12–46)
Lymphs Abs: 4.9 10*3/uL — ABNORMAL HIGH (ref 0.7–4.0)
MCH: 30.5 pg (ref 26.0–34.0)
MCHC: 35.4 g/dL (ref 30.0–36.0)
MCV: 86.3 fL (ref 78.0–100.0)
Monocytes Absolute: 1.4 10*3/uL — ABNORMAL HIGH (ref 0.1–1.0)
Monocytes Relative: 10 % (ref 3–12)
Neutro Abs: 7.2 10*3/uL (ref 1.7–7.7)
Neutrophils Relative %: 53 % (ref 43–77)
Platelets: 104 10*3/uL — ABNORMAL LOW (ref 150–400)
RBC: 4.59 MIL/uL (ref 4.22–5.81)
RDW: 12.9 % (ref 11.5–15.5)
Smear Review: DECREASED
WBC: 13.6 10*3/uL — ABNORMAL HIGH (ref 4.0–10.5)

## 2011-12-06 LAB — PATHOLOGIST SMEAR REVIEW

## 2011-12-06 LAB — GLUCOSE, CAPILLARY
Glucose-Capillary: 119 mg/dL — ABNORMAL HIGH (ref 70–99)
Glucose-Capillary: 115 mg/dL — ABNORMAL HIGH (ref 70–99)
Glucose-Capillary: 132 mg/dL — ABNORMAL HIGH (ref 70–99)

## 2011-12-06 LAB — LACTIC ACID, PLASMA: Lactic Acid, Venous: 1.4 mmol/L (ref 0.5–2.2)

## 2011-12-06 MED ORDER — ONDANSETRON 8 MG/NS 50 ML IVPB: 8.0000 mg | INTRAVENOUS | Status: DC | PRN

## 2011-12-06 MED ORDER — PROMETHAZINE HCL 25 MG/ML IJ SOLN
12.5000 mg | Freq: Four times a day (QID) | INTRAMUSCULAR | Status: DC | PRN
  Administered 2011-12-06 – 2011-12-09 (×11): 25 mg via INTRAVENOUS
  Administered 2011-12-09: 12.5 mg via INTRAVENOUS
  Administered 2011-12-09 – 2011-12-10 (×3): 25 mg via INTRAVENOUS
  Filled 2011-12-06 (×15): qty 1

## 2011-12-06 MED ORDER — ONDANSETRON 8 MG/NS 50 ML IVPB
8.0000 mg | Freq: Four times a day (QID) | INTRAVENOUS | Status: DC | PRN
  Filled 2011-12-06 (×3): qty 8

## 2011-12-06 MED ORDER — POTASSIUM CHLORIDE 2 MEQ/ML IV SOLN
INTRAVENOUS | Status: DC
  Administered 2011-12-06 – 2011-12-07 (×4): via INTRAVENOUS
  Filled 2011-12-06 (×9): qty 1000

## 2011-12-06 MED ORDER — ONDANSETRON 8 MG/NS 50 ML IVPB
8.0000 mg | INTRAVENOUS | Status: DC | PRN
  Administered 2011-12-06 – 2011-12-08 (×2): 8 mg via INTRAVENOUS
  Filled 2011-12-06 (×3): qty 8

## 2011-12-06 MED ORDER — ONDANSETRON HCL 4 MG PO TABS: 8.0000 mg | ORAL_TABLET | Freq: Four times a day (QID) | ORAL | Status: DC | PRN

## 2011-12-06 MED ORDER — ONDANSETRON HCL 4 MG PO TABS
8.0000 mg | ORAL_TABLET | ORAL | Status: DC | PRN
  Filled 2011-12-06: qty 2

## 2011-12-06 MED ORDER — HYOSCYAMINE SULFATE 0.125 MG SL SUBL
0.1250 mg | SUBLINGUAL_TABLET | SUBLINGUAL | Status: DC | PRN
  Administered 2011-12-06 – 2011-12-08 (×3): 0.125 mg via SUBLINGUAL
  Filled 2011-12-06 (×3): qty 1

## 2011-12-06 MED ORDER — SACCHAROMYCES BOULARDII 250 MG PO CAPS
250.0000 mg | ORAL_CAPSULE | Freq: Two times a day (BID) | ORAL | Status: DC
  Administered 2011-12-06 – 2011-12-12 (×13): 250 mg via ORAL
  Filled 2011-12-06 (×14): qty 1

## 2011-12-06 MED ORDER — ONDANSETRON HCL 4 MG PO TABS
8.0000 mg | ORAL_TABLET | Freq: Four times a day (QID) | ORAL | Status: DC | PRN
  Filled 2011-12-06: qty 2

## 2011-12-06 MED ORDER — HYDROMORPHONE HCL PF 1 MG/ML IJ SOLN
1.0000 mg | INTRAMUSCULAR | Status: DC | PRN
  Administered 2011-12-06 – 2011-12-08 (×12): 1 mg via INTRAVENOUS
  Filled 2011-12-06 (×12): qty 1

## 2011-12-06 NOTE — Consult Note (Signed)
[facility] Consult: 11:23 AM 12/06/2011   Referring Provider: Illene Regulus Primary Care Physician:  Illene Regulus, MD Primary Gastroenterologist:  Dr. Lina Sar, inpt consult 11/2011   Reason for Consultation:  Recurrent nausea, vomiting, diarrhea.  HPI: [name] is a very pleasant 51 y.o. male.  Admitted 11/30/11 - 12/04/11 with acute food poisoning.  The specific pathogen was never isolated, but Dr Juanda Chance surmised that the most likely etiology was Staph enterotoxin.  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  Dr Juanda Chance of GI consulted on the case. He was treated with copious IVF for acute renal insufficiency, Flagyl and Cipro IV then PO at discharge.  Bentyl RXd for intestinal spasm. He was tolerating small amounts of liquid but not solid food near time of discharge.   Review of labs from last week reveals stool lactoferrin was negative. Stool culture for various pathogens, including Staph aureus was negative.  CT of 8/9 raised question of cystitis but U/A was negative.  There was also non-obstructing 6mm left kidney stone and cysts.    He was readmitted from the ER yesterday, 8/13, with worsening of never completely resolved GI sxs. He has had multiple diarrheal stools, nausea and emesis.  At times he saw flecks of blood in the emesis.  He was not able to tolerate roast chicken, toast, or popsicle. He filled 3 pink plastic basins with watery stool yesterday just while holding in the ER yesterday.  These hold 5 liters each.  CT scan abdomen now shows enteritis.  WBC count elevated @ 15.0 c/w 8.5 the day before, 8/12.  It had reached max of 17.5 on 12/01/11.  There have been no fevers but did experience sweats and piloerection chills at home yesterday.    Hgb was again elevated c/w dehydration. Fortunately he does not have recurrent acute renal failure. He is again having diminished urine output.     Past Medical History  Diagnosis Date  . Hypertension   . Unspecified intestinal obstruction   . Unspecified asthma   . Chest pain, atypical   . Pulmonary nodule   . Nontoxic multinodular goiter   . Painful respiration   . Dyspnea on exertion   . Trigeminal neuralgia   . Cluster headache   . Hyperlipidemia   . GERD (gastroesophageal reflux disease)   . Complication of anesthesia     difficult waking up"  . Food poisoning 11/2011    Past Surgical History  Procedure Date  . Appendectomy   . Knee arthroscopy   . 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 Right shoulder redo-reconstruction 06/21/10  . Destruction trigeminal nerve via neurolytic agent     Prior to Admission medications   Medication Sig Start Date End Date Taking? Authorizing Provider  ciprofloxacin (CIPRO) 500 MG tablet Take 1 tablet (500 mg total) by mouth 2 (two) times daily. 12/04/11 12/14/11 Yes Jacques Navy, MD  dicyclomine (BENTYL) 20 MG tablet Take 1 tablet (20 mg total) by mouth 2 (two) times daily. 12/04/11 12/03/12 Yes Jacques Navy, MD  metroNIDAZOLE (FLAGYL) 500 MG tablet Take 1 tablet (500 mg total) by mouth 3 (three) times daily. 12/04/11 12/14/11 Yes Jacques Navy, MD  ondansetron (ZOFRAN) 4 MG tablet Take 1 tablet (4 mg total)  by mouth every 8 (eight) hours as needed for nausea. 12/04/11 12/11/11 Yes Jacques Navy, MD  amitriptyline (ELAVIL) 25 MG tablet Take 50 mg by mouth daily.    Historical Provider, MD  aspirin 81 MG EC tablet Take 81 mg by mouth daily.      Historical Provider, MD  cetirizine-pseudoephedrine (ZYRTEC-D) 5-120 MG per tablet Take 1 tablet by mouth 2 (two) times daily.      Historical Provider, MD  diltiazem (TIAZAC) 240 MG 24 hr capsule Take 1 capsule (240 mg total) by mouth daily. 10/03/10   Jacques Navy, MD  lansoprazole (PREVACID) 30 MG capsule TAKE ONE CAPSULE BY MOUTH DAILY 11/06/11   Jacques Navy, MD  levothyroxine (SYNTHROID, LEVOTHROID) 25 MCG tablet TAKE 1 TABLET BY MOUTH DAILY 08/06/11   Jacques Navy, MD  sertraline (ZOLOFT) 50 MG tablet Take 1 tablet (50 mg total) by mouth daily. 12/23/10 12/23/11  Jacques Navy, MD  VYTORIN 10-80 MG per tablet TAKE ONE TABLET BY MOUTH DAILY 07/02/11   Jacques Navy, MD    Scheduled Meds:    . sodium chloride   Intravenous Once  . ciprofloxacin  400 mg Intravenous BID  . diphenhydrAMINE  25 mg Intravenous Once  . HYDROmorphone  1 mg Intravenous Once  . HYDROmorphone  1 mg Intravenous Once  . HYDROmorphone  1 mg Intravenous Once  . LORazepam  1 mg Intravenous Once  . metoCLOPramide (REGLAN) injection  10 mg Intravenous Once  . metronidazole  500 mg Intravenous Q8H  . ondansetron (ZOFRAN) IV  4 mg Intravenous Once  . ondansetron (ZOFRAN) IV  4 mg Intravenous Once  . promethazine  25 mg Intravenous Once  . sodium chloride  1,000 mL Intravenous Once   Infusions:  . dextrose 5 % and 0.45% NaCl 125 mL/hr at 12/06/11 0244   PRN Meds: acetaminophen, acetaminophen, HYDROmorphone (DILAUDID) injection, hyoscyamine, iohexol, labetalol, ondansetron (ZOFRAN) IV, ondansetron, DISCONTD:  HYDROmorphone (DILAUDID) injection   Allergies as of 12/05/2011  . (No Known Allergies)    Family History  Problem Relation Age of Onset  . Diabetes Mother   . Hyperlipidemia Mother   . Hypertension Mother   . Hypertension Father   . Heart disease Father     CAD/CHF  . Cancer Father     Mesothelioma  . Heart disease Maternal Aunt   . Prostate cancer Neg Hx   . Colon cancer Neg Hx     History   Social History  . Marital Status: Married    Spouse Name: N/A    Number of Children: N/A  . 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 Smoking status: Current Everyday  Smoker -- 0.2 packs/day for 18 years    Types: Cigarettes  . Smokeless tobacco: Never Used   Comment: 1-2 cigs per day, started age 67  . Alcohol Use: Yes     rare  . Drug Use: No  . Sexually Active: Not on file   Other Topics Concern  . Not on file   Social History Narrative   [place] 5 years divorced; remarried '961 son - '85Regular Exercise -  NO    REVIEW OF SYSTEMS: Oliguria as above. No blood in urine.  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  New swelling in feet, ankles Generally weak.  No presyncope.Marland Kitchen  No DOE.  Weight down 5 # since last week  Sweat and chills yesterday.  No current headache.  No skin rash, sores, itching.  No solid food dysphagia.     PHYSICAL EXAM: Vital signs in last 24 hours: Temp:  [97.2 F (36.2 C)-98.9 F (37.2 C)] 98.4 F (36.9 C) (08/14 0441) Pulse Rate:  [71-80] 80  (08/14 0441) Resp:  [16-20] 16  (08/14 0441) BP: (116-172)/(74-105) 116/74 mmHg (08/14 0441) SpO2:  [95 %-100 %] 95 % (08/14 0441) Weight:  [223 lb 1.7 oz (101.2 kg)-231 lb (104.781 kg)] 223 lb 1.7 oz (101.2 kg) (08/13 2139)   Weighed 236 last week.  General: looks ill, face looks thinner Head:  No facial edema  Eyes:  No icterus, no pallor Ears:  Not HOH  Nose:  Some nasal congestion, no sneezing. Mouth:  Slightly dry MM, no lesions Neck:  No mass or JVD Lungs:  Clear  Heart: RRR Abdomen:  Tender, soft, BS hypoactive, ND.   Rectal: not performed   Musc/Skeltl: no joint deformity Extremities:  Pedal edema B with 1 plus pitting  Neurologic:  Fully oriented, not confused Skin:  No rash or sores Tattoos:  none   Psych:  Pleasant, affect flat and depressed.    LAB RESULTS: Stool culture 12/01/11 NO SALMONELLA, SHIGELLA, CAMPYLOBACTER, YERSINIA, OR E.COLI 0157:H7 ISOLATED Note: REDUCED NORMAL FLORA PRESENT  Procalcitonin   <0.10   12/05/10   Basename 12/06/11 0550 12/05/11 1524 12/04/11 0344    WBC 13.6* 15.0* 8.5  HGB 14.0 16.2 13.2  HCT 39.6 44.8 38.7*  PLT 104* 113* 124*   BMET Lab Results  Component Value Date   NA 143 12/06/2011   NA 140 12/05/2011   NA 139 12/03/2011   K 3.3* 12/06/2011   K 3.6 12/05/2011   K 4.0 12/03/2011   CL 108 12/06/2011   CL 103 12/05/2011   CL 109 12/03/2011   CO2 23 12/06/2011   CO2 24 12/05/2011   CO2 22 12/03/2011   GLUCOSE 116* 12/06/2011   GLUCOSE 121* 12/05/2011   GLUCOSE 87 12/03/2011   BUN 10 12/06/2011   BUN 8 12/05/2011   BUN 17 12/03/2011   CREATININE 0.97 12/06/2011   CREATININE 1.10 12/05/2011   CREATININE 1.29 12/03/2011   CALCIUM 8.6 12/06/2011   CALCIUM 9.3 12/05/2011   CALCIUM 8.3* 12/03/2011   LFT  Basename 12/06/11 0550 12/05/11 1524  PROT 5.8* 7.2  ALBUMIN 3.0* 3.9  AST 11 18  ALT 9 12  ALKPHOS 39 49  BILITOT 0.4 0.3  BILIDIR -- --  IBILI -- --   PT/INR Lab Results  Component Value Date   INR 1.0 12/08/2008   RADIOLOGY STUDIES: Ct Abdomen Pelvis W Contrast 12/05/2011   CT ABDOMEN AND PELVIS WITH CONTRAST  Technique:  Multidetector CT imaging of the abdomen and pelvis was performed following the standard protocol during bolus administration of intravenous contrast.  IMPRESSION:  1.  Suspect mild circumferential wall thickening of multiple loops of mid and distal small bowel without dilatation or significant adjacent inflammatory/edematous change, suggesting enteritis more likely than ischemia. 2.  Left nephrolithiasis without hydronephrosis.  Original Report Authenticated By: Osa Craver, M.D.   Dg Abd Acute W/chest  12/05/2011  IMPRESSION:  1. Mild cardiomegaly.  No active lung disease. 2.  Paucity of bowel gas.  Cannot exclude small bowel obstruction with fluid-filled bowel.  No free air. 3.  Left renal calculus.  Original Report Authenticated By: Juline Patch, M.D.  ENDOSCOPIC STUDIES: None   IMPRESSION: *  Prolonged Food borne illness with recurrent copious, diarrhea and new changes of enteritis on CT *   Dehydration without recurrent ARF *  Hypokalemia  PLAN: *  Add Florastor, tweak IVF to add potassium and increase rate to 150/hour.  *  New stools studies:  Giardia/crypto eia, O &P, Cryptosporidium smear.  Repeat stool culture.  *  Cbc, BMET in AM   LOS: 1 day   Jennye Moccasin  12/06/2011, 11:23 AM Pager: [phone]

## 2011-12-06 NOTE — Progress Notes (Signed)
Subjective: [name] has been readmitted for refractoyr nausea/vomiting/diarrhea. He was release Monday August 12th for similar problem that had been diagnosed as food poisoning. Labs including stool cultures were negative: no salmonella, shigella, other organisms, c. Diff, negative for lactoferrin. At d/c he was able to take fluids and bland food and his abdominal pain was controlled. He reports that at home he tried light food: popsicle and palin roasted chicken with toast but felt nauseated. After taking his evening medication (cipro, flagyl, bentyl) he quickly developed vomting.   Admission labs reveal normal renal function, mild leukocytosis, CT suggestive of enteritis. He has continued to have nausea and vomiting and did produce some flecks of blood in his vomitus, most likely from a mallory-weis tear. He continues to feel weak.  Objective: Lab: Lab Results  Component Value Date   WBC 13.6* 12/06/2011   HGB 14.0 12/06/2011   HCT 39.6 12/06/2011   MCV 86.3 12/06/2011   PLT PENDING 12/06/2011   BMET    Component Value Date/Time   NA 140 12/05/2011 1524   K 3.6 12/05/2011 1524   CL 103 12/05/2011 1524   CO2 24 12/05/2011 1524   GLUCOSE 121* 12/05/2011 1524   BUN 8 12/05/2011 1524   CREATININE 1.10 12/05/2011 1524   CALCIUM 9.3 12/05/2011 1524   GFRNONAA 78* 12/05/2011 1524   GFRAA >90 12/05/2011 1524     Imaging: CT abd/pelvix 8/13: IMPRESSION:  1. Suspect mild circumferential wall thickening of multiple loops  of mid and distal small bowel without dilatation or significant  adjacent inflammatory/edematous change, suggesting enteritis more  likely than ischemia.  2. Left nephrolithiasis without hydronephrosis. Acute abdominal series 8/13: IMPRESSION:  1. Mild cardiomegaly. No active lung disease.  2. Paucity of bowel gas. Cannot exclude small bowel obstruction  with fluid-filled bowel. No free air.  3. Left renal calculus   Scheduled Meds:   . sodium chloride   Intravenous Once   . ciprofloxacin  400 mg Intravenous BID  . diphenhydrAMINE  25 mg Intravenous Once  . HYDROmorphone  1 mg Intravenous Once  . HYDROmorphone  1 mg Intravenous Once  . HYDROmorphone  1 mg Intravenous Once  . LORazepam  1 mg Intravenous Once  . metoCLOPramide (REGLAN) injection  10 mg Intravenous Once  . metronidazole  500 mg Intravenous Q8H  . ondansetron (ZOFRAN) IV  4 mg Intravenous Once  . ondansetron (ZOFRAN) IV  4 mg Intravenous Once  . promethazine  25 mg Intravenous Once  . sodium chloride  1,000 mL Intravenous Once  . DISCONTD: sodium chloride   Intravenous STAT   Continuous Infusions:   . dextrose 5 % and 0.45% NaCl 125 mL/hr at 12/06/11 0244   PRN Meds:.acetaminophen, acetaminophen, HYDROmorphone (DILAUDID) injection, iohexol, labetalol, ondansetron (ZOFRAN) IV, ondansetron, DISCONTD:  HYDROmorphone (DILAUDID) injection, DISCONTD: ondansetron (ZOFRAN) IV, DISCONTD: ondansetron (ZOFRAN) IV, DISCONTD: ondansetron   Physical Exam: Filed Vitals:   12/06/11 0441  BP: 116/74  Pulse: 80  Temp: 98.4 F (36.9 C)  Resp: 16   Wt Readings from Last 3 Encounters:  12/05/11 223 lb 1.7 oz (101.2 kg)  12/01/11 236 lb (107.049 kg)  11/22/11 236 lb (107.049 kg)    Intake/Output Summary (Last 24 hours) at 12/06/11 0727 Last data filed at 12/05/11 2246  Gross per 24 hour  Intake      0 ml  Output    520 ml  Net   -520 ml   Gen'l - heavyset AA man in no acute distress HEENT-  C&S clear w/o icterus Cor- RRR Pulm - normal respirations Abd - hypoactive BS, diffuse tenderness but worse on the left side, no guarding or rebound Neuro - A&O x 3      Assessment/Plan: 1. GI - recurrent N/V/D. Working diagnosis was food poisoning. No evidence of enteric infection with all studies being negative. Low likelihood of ischemic bowel.  Plan Continue supportive care with zofran, IVF. Will d/c reglan - made him worse on last admission. Will add levsin SL  No focus of infection but  leukocytosis did return with missed doses of antibiotics. Will continue IV antiobiotics  Reconsult GI   [name] IM Call-grp - [facility] (o) [phone]; ([phone]  12/06/2011, 7:22 AM

## 2011-12-06 NOTE — Consult Note (Signed)
Patient seen, examined, and I agree with the above documentation, including the assessment and plan. Very likely this is food-borne infectious enteritis given acute onset and symptoms (nausea, vomiting and diarrhea).  These are usually self-limited, however, we are approaching 1 week Will check additional stools studies and repeat culture.   Agree with probiotic Leukocytosis recurred, and he is back on IV abx. IVFs, pain control and antiemetics.

## 2011-12-07 ENCOUNTER — Inpatient Hospital Stay (HOSPITAL_COMMUNITY): Payer: Commercial Managed Care - PPO

## 2011-12-07 ENCOUNTER — Encounter (HOSPITAL_COMMUNITY): Payer: Self-pay | Admitting: Radiology

## 2011-12-07 DIAGNOSIS — R112 Nausea with vomiting, unspecified: Secondary | ICD-10-CM

## 2011-12-07 DIAGNOSIS — R109 Unspecified abdominal pain: Secondary | ICD-10-CM

## 2011-12-07 DIAGNOSIS — R197 Diarrhea, unspecified: Secondary | ICD-10-CM

## 2011-12-07 LAB — CBC
HCT: 37.8 % — ABNORMAL LOW (ref 39.0–52.0)
Hemoglobin: 13.1 g/dL (ref 13.0–17.0)
MCH: 30.3 pg (ref 26.0–34.0)
MCHC: 34.7 g/dL (ref 30.0–36.0)
MCV: 87.5 fL (ref 78.0–100.0)
Platelets: 97 10*3/uL — ABNORMAL LOW (ref 150–400)
RBC: 4.32 MIL/uL (ref 4.22–5.81)
RDW: 13.1 % (ref 11.5–15.5)
WBC: 9.8 10*3/uL (ref 4.0–10.5)

## 2011-12-07 LAB — BASIC METABOLIC PANEL
BUN: 10 mg/dL (ref 6–23)
CO2: 25 mEq/L (ref 19–32)
Calcium: 8.5 mg/dL (ref 8.4–10.5)
Chloride: 105 mEq/L (ref 96–112)
Creatinine, Ser: 1.11 mg/dL (ref 0.50–1.35)
GFR calc Af Amer: 89 mL/min — ABNORMAL LOW (ref >90)
GFR calc non Af Amer: 77 mL/min — ABNORMAL LOW (ref >90)
Glucose, Bld: 96 mg/dL (ref 70–99)
Potassium: 3.3 mEq/L — ABNORMAL LOW (ref 3.5–5.1)
Sodium: 139 mEq/L (ref 135–145)

## 2011-12-07 LAB — CLOSTRIDIUM DIFFICILE BY PCR: Toxigenic C. Difficile by PCR: NEGATIVE

## 2011-12-07 MED ORDER — POTASSIUM CHLORIDE 10 MEQ/100ML IV SOLN
10.0000 meq | INTRAVENOUS | Status: AC
  Administered 2011-12-07 (×2): 10 meq via INTRAVENOUS
  Filled 2011-12-07 (×3): qty 100

## 2011-12-07 MED ORDER — IOHEXOL 350 MG/ML SOLN
100.0000 mL | Freq: Once | INTRAVENOUS | Status: AC | PRN
  Administered 2011-12-07: 100 mL via INTRAVENOUS

## 2011-12-07 NOTE — Consult Note (Signed)
CT angio today actually looks improved. I would agree with continued bowel rest and abx. No sign of ischemia. We will follow

## 2011-12-07 NOTE — Progress Notes (Signed)
     [facility] Daily Rounding Note 12/07/2011, 8:42 AM  SUBJECTIVE:       Ongoing abdominal pain, worse on left rates it at a 10/10 at its peak.  Nausea but little emesis.  Loose stools persist, still large volume but less frequent. Better urine output.  OBJECTIVE:         Vital signs in last 24 hours:    Temp:  [97.5 F (36.4 C)-98.3 F (36.8 C)] 98.3 F (36.8 C) (08/15 0649) Pulse Rate:  [60-81] 60  (08/15 0649) Resp:  [12-18] 12  (08/15 0649) BP: (109-142)/(72-79) 109/72 mmHg (08/15 0649) SpO2:  [94 %-95 %] 94 % (08/15 0649) Last BM Date: 12/05/11 General: looks ill, diaphoretic   Heart: RRR Chest: clear B Abdomen: soft, tender on left with rebound.  Scant bowel sounds ( with yellow, contact precautions stethescope).  Extremities: pedal edema Neuro/Psych:  Pleasant. Affect depressed.  Not confused, not disoriented.   Intake/Output from previous day: 08/14 0701 - 08/15 0700 In: 1650 [P.O.:600; I.V.:750; IV Piggyback:300] Out: -   Intake/Output this shift:    Lab Results:  Basename 12/07/11 0630 12/06/11 0550 12/05/11 1524  WBC 9.8 13.6* 15.0*  HGB 13.1 14.0 16.2  HCT 37.8* 39.6 44.8  PLT 97* 104* 113*   BMET  Basename 12/07/11 0630 12/06/11 0550 12/05/11 1524  NA 139 143 140  K 3.3* 3.3* 3.6  CL 105 108 103  CO2 25 23 24   GLUCOSE 96 116* 121*  BUN 10 10 8   CREATININE 1.11 0.97 1.10  CALCIUM 8.5 8.6 9.3   LFT  Basename 12/06/11 0550 12/05/11 1524  PROT 5.8* 7.2  ALBUMIN 3.0* 3.9  AST 11 18  ALT 9 12  ALKPHOS 39 49  BILITOT 0.4 0.3  BILIDIR -- --  IBILI -- --    ASSESMENT: *  Prolonged, 10 day, GI illness of n/v/d and abdominal pain despite abx (day 7 cipro and Flagyl) in suspected food poisoning.  WBCs improved. Pain is more prominent and he now has rebound on exam.  Agree with Dr Debby Bud that we need to rule out intestinal ischemia.  He ordered CT angio.  Enteritis type changes in mid and distal small bowel noted on CT of 8/13 *  Hypokalemia. IV  runs x 2 ordered.  Has10 meq K in IVF. *  Thrombocytopenia.  *  Dehydration without recurrent renal failure, improved as evidenced by lowered H & H.    PLAN: *  CT angio.   *  I called consult to surgery *  No results yet on additional stools studies ordered 12/06/11   LOS: 2 days   Jennye Moccasin  12/07/2011, 8:42 AM Pager: [phone]   2:30 PM addendum CT angio 1. Negative for evidence of ischemia. Mild, scattered  atherosclerotic vascular disease of the descending abdominal aorta  is identified. There is no aneurysm or notable stenosis of major  branch vessels.  2. Improved appearance of the small bowel with mild thickening of  a few scattered loops seen on today's exam.  3. Nonobstructing stone upper pole left kidney.  4. Small fat containing umbilical hernia.

## 2011-12-07 NOTE — Progress Notes (Signed)
Patient seen, examined, and I agree with the above documentation, including the assessment and plan. Repeat CT exam is somewhat improved which is reassuring and makes an ischemic insult less likely. With this knowledge, the overall process is likely infectious in postinfectious. I'm not optimistic that we will ever isolate a responsible organism. Continue supportive care. Appreciate surgical consultation

## 2011-12-07 NOTE — Progress Notes (Signed)
Subjective: Per RN [name] had a tough night requiring zofran, phenergan, diluadid and levsin. He continues to have a lot of pain, more on the left.  Objective: Lab: Lab Results  Component Value Date   WBC 9.8 12/07/2011   HGB 13.1 12/07/2011   HCT 37.8* 12/07/2011   MCV 87.5 12/07/2011   PLT 97* 12/07/2011   BMET    Component Value Date/Time   NA 143 12/06/2011 0550   K 3.3* 12/06/2011 0550   CL 108 12/06/2011 0550   CO2 23 12/06/2011 0550   GLUCOSE 116* 12/06/2011 0550   BUN 10 12/06/2011 0550   CREATININE 0.97 12/06/2011 0550   CALCIUM 8.6 12/06/2011 0550   GFRNONAA >90 12/06/2011 0550   GFRAA >90 12/06/2011 0550   Stool studies pending   Imaging: No new imaging  Scheduled Meds:   . ciprofloxacin  400 mg Intravenous BID  . metronidazole  500 mg Intravenous Q8H  . saccharomyces boulardii  250 mg Oral BID   Continuous Infusions:   . dextrose 5 % and 0.45% NaCl 1,000 mL with potassium chloride 10 mEq infusion 150 mL/hr at 12/07/11 0043  . DISCONTD: dextrose 5 % and 0.45% NaCl 125 mL/hr at 12/06/11 0244   PRN Meds:.acetaminophen, acetaminophen, HYDROmorphone (DILAUDID) injection, hyoscyamine, labetalol, ondansetron (ZOFRAN) IV, ondansetron, promethazine, DISCONTD:  HYDROmorphone (DILAUDID) injection, DISCONTD: ondansetron (ZOFRAN) IV, DISCONTD: ondansetron (ZOFRAN) IV, DISCONTD: ondansetron, DISCONTD: ondansetron   Physical Exam: Filed Vitals:   12/07/11 0649  BP: 109/72  Pulse: 60  Temp: 98.3 F (36.8 C)  Resp: 12    Intake/Output Summary (Last 24 hours) at 12/07/11 1610 Last data filed at 12/06/11 1849  Gross per 24 hour  Intake   1650 ml  Output      0 ml  Net   1650 ml  total for Adm: +1,130 Wt Readings from Last 3 Encounters:  12/05/11 223 lb 1.7 oz (101.2 kg)  12/01/11 236 lb (107.049 kg)  11/22/11 236 lb (107.049 kg)   HEENT- C&S clear Cor- 2+ radial pulse, RRR Pulm - normal respirations Abd - hypoactive bowel sounds, mild guarding left abdomen, very  tender to palpation left Neuro - A&O x 3     Assessment/Plan: 1. GI - day #10 of symptoms - seems long for food poisoning. Concern for intestinal ischemia - CT reveals thickened sigmoid bowel. Plan Continue supportive care  CT angio abdomen  2. ID - No infecting organism or foci of infection to-date. WBC is down. Day 7 of antibiotics this illness Plan-  Continue cipro and flagyl  3. Renal  Normal renal function with normal creatinine   Illene Regulus [facility] Call-grp - [facility] (o) [phone]; ([phone]  12/07/2011, 7:21 AM

## 2011-12-07 NOTE — Consult Note (Signed)
Reason for Consult: Abdominal pain with Hx of N/V diarrhea. Referring Physician: Dr. Erick Blinks  Gorman [name] is an 51 y.o. male.  HPI:who was just discharged yesterday presented back to [facility] with complaints of recurrence of his nausea vomiting diarrhea with abdominal cramps and pain. Patient was initially admitted on August 8 with nausea vomiting and diarrhea abdominal pain features compatible with food poisoning/enteritis after eating tuna and crab dip at a food show in [place]. At that time patient also was in renal failure which improved with hydration. Patient at time of discharge was able to tolerate oral diet. But since then hje has developed nausea vomiting and multiple episodes of diarrhea but denies abdominal pain more of a crampy nature. He presented to the ER in [facility] and over there CT abdomen and pelvis shows features of enteritis. Patient in addition also has mild thrombocytopenia with leukocytosis. Patient was afebrile at time of presentation to ed. Since admission patient has had at least 4 loose bowel movements which are yellow in color and one episode of vomiting which was reported as being blood-tinged.  Since readmission he has been on IV CIPRO and Flagyl with some decrease in his frequency of loose stools, however he remains diffusely tender across his abdomen with some guarding and increased tenderness in his LLQ. CT scan:. Suspect mild circumferential wall thickening of multiple loops  of mid and distal small bowel without dilatation or significant  adjacent inflammatory/edematous change, suggesting enteritis more  likely than ischemia.  2. Left nephrolithiasis without hydronephrosis.   Past Medical History  Diagnosis Date  . Hypertension   . Unspecified intestinal obstruction   . Unspecified asthma   . Chest pain, atypical   . Pulmonary nodule   . Nontoxic multinodular goiter   . Painful respiration   . Dyspnea on exertion   . Trigeminal neuralgia    . Cluster headache   . Hyperlipidemia   . GERD (gastroesophageal reflux disease)   . Complication of anesthesia     difficult waking up"  . Food poisoning 11/2011    Past Surgical History  Procedure Date  . Appendectomy   . Knee arthroscopy   . 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 Right shoulder redo-reconstruction 06/21/10  . Destruction trigeminal nerve via neurolytic agent     Family History  Problem Relation Age of Onset  . Diabetes Mother   . Hyperlipidemia Mother   . Hypertension Mother   . Hypertension Father   . Heart disease Father     CAD/CHF  . Cancer Father     Mesothelioma  . Heart disease Maternal Aunt   . Prostate cancer Neg Hx   . Colon cancer Neg Hx     Social History:  reports that he has been smoking Cigarettes.  He has a 4.5 pack-year smoking history. He has never used smokeless tobacco. He reports that he drinks alcohol. He reports that he does not use illicit drugs.  Allergies: No Known Allergies  Medications: I have reviewed the patient's current medications.  Results for orders placed during the hospital encounter of 12/05/11 (from the past 48 hour(s))  LACTIC ACID, PLASMA     Status: Normal   Collection Time   12/05/11  3:24 PM      Component Value Range Comment   Lactic Acid, Venous 1.6  0.5 - 2.2 mmol/L   CBC WITH DIFFERENTIAL     Status: Abnormal   Collection Time  12/05/11  3:24 PM      Component Value Range Comment   WBC 15.0 (*) 4.0 - 10.5 K/uL    RBC 5.28  4.22 - 5.81 MIL/uL    Hemoglobin 16.2  13.0 - 17.0 g/dL    HCT 96.0  45.4 - 09.8 %    MCV 84.8  78.0 - 100.0 fL    MCH 30.7  26.0 - 34.0 pg    MCHC 36.2 (*) 30.0 - 36.0 g/dL    RDW 11.9  14.7 - 82.9 %    Platelets 113 (*) 150 - 400 K/uL    Neutrophils Relative 81 (*) 43 - 77 %    Neutro Abs 12.2 (*) 1.7 - 7.7 K/uL    Lymphocytes Relative 14  12 - 46 %    Lymphs Abs 2.1  0.7 - 4.0 K/uL    Monocytes Relative 5  3 - 12 %    Monocytes Absolute  0.7  0.1 - 1.0 K/uL    Eosinophils Relative 0  0 - 5 %    Eosinophils Absolute 0.0  0.0 - 0.7 K/uL    Basophils Relative 0  0 - 1 %    Basophils Absolute 0.0  0.0 - 0.1 K/uL   COMPREHENSIVE METABOLIC PANEL     Status: Abnormal   Collection Time   12/05/11  3:24 PM      Component Value Range Comment   Sodium 140  135 - 145 mEq/L    Potassium 3.6  3.5 - 5.1 mEq/L    Chloride 103  96 - 112 mEq/L    CO2 24  19 - 32 mEq/L    Glucose, Bld 121 (*) 70 - 99 mg/dL    BUN 8  6 - 23 mg/dL    Creatinine, Ser 5.62  0.50 - 1.35 mg/dL    Calcium 9.3  8.4 - 13.0 mg/dL    Total Protein 7.2  6.0 - 8.3 g/dL    Albumin 3.9  3.5 - 5.2 g/dL    AST 18  0 - 37 U/L    ALT 12  0 - 53 U/L    Alkaline Phosphatase 49  39 - 117 U/L    Total Bilirubin 0.3  0.3 - 1.2 mg/dL    GFR calc non Af Amer 78 (*) >90 mL/min    GFR calc Af Amer >90  >90 mL/min   LIPASE, BLOOD     Status: Normal   Collection Time   12/05/11  3:24 PM      Component Value Range Comment   Lipase 26  11 - 59 U/L   PROCALCITONIN     Status: Normal   Collection Time   12/05/11  3:25 PM      Component Value Range Comment   Procalcitonin <0.10     URINALYSIS, ROUTINE W REFLEX MICROSCOPIC     Status: Abnormal   Collection Time   12/05/11  4:51 PM      Component Value Range Comment   Color, Urine YELLOW  YELLOW    APPearance CLEAR  CLEAR    Specific Gravity, Urine 1.019  1.005 - 1.030    pH 5.5  5.0 - 8.0    Glucose, UA NEGATIVE  NEGATIVE mg/dL    Hgb urine dipstick NEGATIVE  NEGATIVE    Bilirubin Urine NEGATIVE  NEGATIVE    Ketones, ur 15 (*) NEGATIVE mg/dL    Protein, ur NEGATIVE  NEGATIVE mg/dL    Urobilinogen, UA 0.2  0.0 -  1.0 mg/dL    Nitrite NEGATIVE  NEGATIVE    Leukocytes, UA NEGATIVE  NEGATIVE MICROSCOPIC NOT DONE ON URINES WITH NEGATIVE PROTEIN, BLOOD, LEUKOCYTES, NITRITE, OR GLUCOSE <1000 mg/dL.  LACTIC ACID, PLASMA     Status: Normal   Collection Time   12/05/11 11:42 PM      Component Value Range Comment   Lactic Acid,  Venous 1.4  0.5 - 2.2 mmol/L   PATHOLOGIST SMEAR REVIEW     Status: Normal   Collection Time   12/05/11 11:50 PM      Component Value Range Comment   Path Review        Value: MILD ABSOLUTE NEUTROPHILIC LEUKOCYTOSIS WITH NO LEFT SHIFT.  GLUCOSE, CAPILLARY     Status: Abnormal   Collection Time   12/05/11 11:51 PM      Component Value Range Comment   Glucose-Capillary 119 (*) 70 - 99 mg/dL    Comment 1 Documented in Chart      Comment 2 Notify RN     COMPREHENSIVE METABOLIC PANEL     Status: Abnormal   Collection Time   12/06/11  5:50 AM      Component Value Range Comment   Sodium 143  135 - 145 mEq/L    Potassium 3.3 (*) 3.5 - 5.1 mEq/L    Chloride 108  96 - 112 mEq/L    CO2 23  19 - 32 mEq/L    Glucose, Bld 116 (*) 70 - 99 mg/dL    BUN 10  6 - 23 mg/dL    Creatinine, Ser 1.61  0.50 - 1.35 mg/dL    Calcium 8.6  8.4 - 09.6 mg/dL    Total Protein 5.8 (*) 6.0 - 8.3 g/dL    Albumin 3.0 (*) 3.5 - 5.2 g/dL    AST 11  0 - 37 U/L    ALT 9  0 - 53 U/L    Alkaline Phosphatase 39  39 - 117 U/L    Total Bilirubin 0.4  0.3 - 1.2 mg/dL    GFR calc non Af Amer >90  >90 mL/min    GFR calc Af Amer >90  >90 mL/min   CBC WITH DIFFERENTIAL     Status: Abnormal   Collection Time   12/06/11  5:50 AM      Component Value Range Comment   WBC 13.6 (*) 4.0 - 10.5 K/uL    RBC 4.59  4.22 - 5.81 MIL/uL    Hemoglobin 14.0  13.0 - 17.0 g/dL    HCT 04.5  40.9 - 81.1 %    MCV 86.3  78.0 - 100.0 fL    MCH 30.5  26.0 - 34.0 pg    MCHC 35.4  30.0 - 36.0 g/dL    RDW 91.4  78.2 - 95.6 %    Platelets 104 (*) 150 - 400 K/uL PLATELET COUNT CONFIRMED BY SMEAR   Neutrophils Relative 53  43 - 77 %    Lymphocytes Relative 36  12 - 46 %    Monocytes Relative 10  3 - 12 %    Eosinophils Relative 1  0 - 5 %    Basophils Relative 0  0 - 1 %    Neutro Abs 7.2  1.7 - 7.7 K/uL    Lymphs Abs 4.9 (*) 0.7 - 4.0 K/uL    Monocytes Absolute 1.4 (*) 0.1 - 1.0 K/uL    Eosinophils Absolute 0.1  0.0 - 0.7 K/uL    Basophils  Absolute 0.0  0.0 - 0.1 K/uL    Smear Review PLATELETS APPEAR DECREASED     GLUCOSE, CAPILLARY     Status: Abnormal   Collection Time   12/06/11  6:22 AM      Component Value Range Comment   Glucose-Capillary 115 (*) 70 - 99 mg/dL    Comment 1 Notify RN      Comment 2 Documented in Chart     GLUCOSE, CAPILLARY     Status: Abnormal   Collection Time   12/06/11 12:17 PM      Component Value Range Comment   Glucose-Capillary 132 (*) 70 - 99 mg/dL   BASIC METABOLIC PANEL     Status: Abnormal   Collection Time   12/07/11  6:30 AM      Component Value Range Comment   Sodium 139  135 - 145 mEq/L    Potassium 3.3 (*) 3.5 - 5.1 mEq/L    Chloride 105  96 - 112 mEq/L    CO2 25  19 - 32 mEq/L    Glucose, Bld 96  70 - 99 mg/dL    BUN 10  6 - 23 mg/dL    Creatinine, Ser 1.61  0.50 - 1.35 mg/dL    Calcium 8.5  8.4 - 09.6 mg/dL    GFR calc non Af Amer 77 (*) >90 mL/min    GFR calc Af Amer 89 (*) >90 mL/min   CBC     Status: Abnormal   Collection Time   12/07/11  6:30 AM      Component Value Range Comment   WBC 9.8  4.0 - 10.5 K/uL    RBC 4.32  4.22 - 5.81 MIL/uL    Hemoglobin 13.1  13.0 - 17.0 g/dL    HCT 04.5 (*) 40.9 - 52.0 %    MCV 87.5  78.0 - 100.0 fL    MCH 30.3  26.0 - 34.0 pg    MCHC 34.7  30.0 - 36.0 g/dL    RDW 81.1  91.4 - 78.2 %    Platelets 97 (*) 150 - 400 K/uL CONSISTENT WITH PREVIOUS RESULT    Ct Abdomen Pelvis W Contrast  12/05/2011  *RADIOLOGY REPORT*  Clinical Data: Abdominal pain, nausea.  CT ABDOMEN AND PELVIS WITH CONTRAST  Technique:  Multidetector CT imaging of the abdomen and pelvis was performed following the standard protocol during bolus administration of intravenous contrast.  Contrast: OMNIPAQUE IOHEXOL 300 MG/ML  SOLN  Comparison: 12/01/2011  Findings: Mild dependent atelectasis in the visualized lung bases. There is trace perihepatic ascites.  Unremarkable liver, nondistended gallbladder, spleen, adrenal glands, pancreas.  Left nephrolithiasis, single  stone or cluster in the upper pole measuring 6 mm.  Probable exophytic left renal cysts, largest 2.1 cm from the upper pole.  No hydronephrosis or ureterectasis.  No ureteral calculus.  Urinary bladder incompletely distended.  No ascites.  No free air.  The stomach is nondilated. There is mild circumferential wall thickening suspected in multiple mid and distal small bowel loops, assessment somewhat limited due to lack of any oral contrast material. Appendix not seen.  The colon is nondilated, with a few scattered descending diverticula.  Bilateral pelvic phleboliths.  No pelvic, retroperitoneal, or mesenteric adenopathy.  Portal vein patent.  Patchy aortoiliac arterial calcifications without aneurysm. Small umbilical hernia containing only mesenteric fat.  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  IMPRESSION:  1.  Suspect mild circumferential wall thickening of multiple loops of mid and  distal small bowel without dilatation or significant adjacent inflammatory/edematous change, suggesting enteritis more likely than ischemia. 2.  Left nephrolithiasis without hydronephrosis.  Original Report Authenticated By: Osa Craver, M.D.   Dg Abd Acute W/chest  12/05/2011  *RADIOLOGY REPORT*  Clinical Data: Abdominal pain, nausea, vomiting, diarrhea  ACUTE ABDOMEN SERIES (ABDOMEN 2 VIEW & CHEST 1 VIEW)  Comparison: Chest and acute abdomen of 12/01/2011 and CT abdomen pelvis of the same.  Findings: Mild volume loss is present at the right lung base.  No infiltrate or effusion is seen.  The heart is mildly enlarged and stable.  A prosthetic right humeral head is noted.  Supine and erect views the abdomen show a paucity of bowel gas.  No free air is seen.  A partial small bowel obstruction cannot be excluded.  A left renal calculus again is noted.  IMPRESSION:  1. Mild cardiomegaly.  No active lung disease. 2.  Paucity of bowel gas.  Cannot exclude small bowel obstruction with fluid-filled bowel.  No free air.  3.  Left renal calculus.  Original Report Authenticated By: Juline Patch, M.D.    Review of Systems  Constitutional: Positive for malaise/fatigue. Negative for fever, chills, weight loss and diaphoresis.  HENT: Negative.   Eyes: Negative.   Respiratory: Negative.   Cardiovascular: Negative.   Gastrointestinal: Positive for nausea, vomiting, abdominal pain and diarrhea. Negative for constipation, blood in stool and melena.  Genitourinary: Negative.   Musculoskeletal: Negative.   Skin: Negative.   Neurological: Negative.  Negative for weakness.  Endo/Heme/Allergies: Negative.   Psychiatric/Behavioral: Negative.    Blood pressure 109/72, pulse 60, temperature 98.3 F (36.8 C), temperature source Oral, resp. rate 12, height 5\' 10"  (1.778 m), weight 223 lb 1.7 oz (101.2 kg), SpO2 94.00%. Physical Exam  Constitutional: He appears well-developed and well-nourished. No distress.  HENT:  Head: Normocephalic and atraumatic.  Mouth/Throat: No oropharyngeal exudate.  Eyes: Conjunctivae and EOM are normal. Pupils are equal, round, and reactive to light. Right eye exhibits no discharge. Left eye exhibits no discharge. No scleral icterus.  Neck: Normal range of motion. Neck supple. No JVD present. No tracheal deviation present. No thyromegaly present.  Cardiovascular: Normal rate, regular rhythm, normal heart sounds and intact distal pulses.  Exam reveals no gallop and no friction rub.   No murmur heard. Respiratory: Effort normal and breath sounds normal. No stridor. No respiratory distress. He has no wheezes. He has no rales. He exhibits no tenderness.  GI: Soft. Bowel sounds are normal. He exhibits no distension and no mass. There is tenderness. There is guarding. There is no rebound.    Lymphadenopathy:    He has no cervical adenopathy.  Skin: He is not diaphoretic.    Assessment/Plan: Abdominal pain of unknown etiology (probable s/p food poisoning with resulting enteritis.) Multiple  medical problems: (these are being managed by medicine team)  Plan: Agree with current plan, GI and medicines notes are seen and appreciated, we will await results of CT angio.  Thank you for this consult, we will continue to follow.   [name] 12/07/2011, 10:35 AM

## 2011-12-08 LAB — BASIC METABOLIC PANEL
BUN: 5 mg/dL — ABNORMAL LOW (ref 6–23)
CO2: 24 mEq/L (ref 19–32)
Calcium: 9 mg/dL (ref 8.4–10.5)
Chloride: 103 mEq/L (ref 96–112)
Creatinine, Ser: 1.01 mg/dL (ref 0.50–1.35)
GFR calc Af Amer: >90 mL/min (ref >90)
GFR calc non Af Amer: 86 mL/min — ABNORMAL LOW (ref >90)
Glucose, Bld: 110 mg/dL — ABNORMAL HIGH (ref 70–99)
Potassium: 3.3 mEq/L — ABNORMAL LOW (ref 3.5–5.1)
Sodium: 140 mEq/L (ref 135–145)

## 2011-12-08 LAB — GIARDIA/CRYPTOSPORIDIUM SCREEN(EIA)
Cryptosporidium Screen (EIA): NEGATIVE
Giardia Screen - EIA: NEGATIVE

## 2011-12-08 MED ORDER — DIPHENOXYLATE-ATROPINE 2.5-0.025 MG PO TABS
1.0000 | ORAL_TABLET | Freq: Two times a day (BID) | ORAL | Status: DC
  Administered 2011-12-08 – 2011-12-13 (×9): 1 via ORAL
  Filled 2011-12-08 (×10): qty 1

## 2011-12-08 MED ORDER — DICYCLOMINE HCL 10 MG PO CAPS
10.0000 mg | ORAL_CAPSULE | Freq: Three times a day (TID) | ORAL | Status: DC
  Administered 2011-12-08 – 2011-12-12 (×15): 10 mg via ORAL
  Filled 2011-12-08 (×20): qty 1

## 2011-12-08 MED ORDER — MORPHINE SULFATE 4 MG/ML IJ SOLN
4.0000 mg | INTRAMUSCULAR | Status: DC | PRN
  Administered 2011-12-08 – 2011-12-13 (×30): 4 mg via INTRAVENOUS
  Filled 2011-12-08 (×29): qty 1

## 2011-12-08 MED ORDER — LORAZEPAM 2 MG/ML IJ SOLN
0.5000 mg | Freq: Four times a day (QID) | INTRAMUSCULAR | Status: DC | PRN
  Administered 2011-12-08 – 2011-12-13 (×12): 0.5 mg via INTRAVENOUS
  Filled 2011-12-08 (×12): qty 1

## 2011-12-08 MED ORDER — MORPHINE BOLUS VIA INFUSION: 4.0000 mg | INTRAVENOUS | Status: DC | PRN

## 2011-12-08 MED ORDER — POTASSIUM CHLORIDE 2 MEQ/ML IV SOLN
INTRAVENOUS | Status: DC
  Administered 2011-12-08 – 2011-12-16 (×20): via INTRAVENOUS
  Filled 2011-12-08 (×26): qty 1000

## 2011-12-08 MED ORDER — ONDANSETRON HCL 4 MG PO TABS
8.0000 mg | ORAL_TABLET | Freq: Three times a day (TID) | ORAL | Status: DC | PRN
  Filled 2011-12-08: qty 2

## 2011-12-08 MED ORDER — ONDANSETRON 8 MG/NS 50 ML IVPB
8.0000 mg | Freq: Three times a day (TID) | INTRAVENOUS | Status: DC | PRN
  Filled 2011-12-08 (×3): qty 8

## 2011-12-08 MED ORDER — POTASSIUM CHLORIDE CRYS ER 20 MEQ PO TBCR
40.0000 meq | EXTENDED_RELEASE_TABLET | Freq: Two times a day (BID) | ORAL | Status: AC
  Administered 2011-12-08 (×2): 40 meq via ORAL
  Filled 2011-12-08 (×2): qty 2

## 2011-12-08 MED ORDER — RIFAXIMIN 550 MG PO TABS
550.0000 mg | ORAL_TABLET | Freq: Three times a day (TID) | ORAL | Status: DC
  Administered 2011-12-08 – 2011-12-16 (×24): 550 mg via ORAL
  Filled 2011-12-08 (×28): qty 1

## 2011-12-08 MED ORDER — ENOXAPARIN SODIUM 40 MG/0.4ML ~~LOC~~ SOLN
40.0000 mg | SUBCUTANEOUS | Status: DC
  Administered 2011-12-08 – 2011-12-16 (×9): 40 mg via SUBCUTANEOUS
  Filled 2011-12-08 (×9): qty 0.4

## 2011-12-08 NOTE — Progress Notes (Signed)
Subjective: Per RN note - continued abdominal pain. Patient confirms having a rough night. Dilaudid is not providing as much relief from pain as did MS and bentyl worked better than levsin.   Objective: Lab: Lab Results  Component Value Date   WBC 9.8 12/07/2011   HGB 13.1 12/07/2011   HCT 37.8* 12/07/2011   MCV 87.5 12/07/2011   PLT 97* 12/07/2011   BMET    Component Value Date/Time   NA 139 12/07/2011 0630   K 3.3* 12/07/2011 0630   CL 105 12/07/2011 0630   CO2 25 12/07/2011 0630   GLUCOSE 96 12/07/2011 0630   BUN 10 12/07/2011 0630   CREATININE 1.11 12/07/2011 0630   CALCIUM 8.5 12/07/2011 0630   GFRNONAA 77* 12/07/2011 0630   GFRAA 89* 12/07/2011 0630     Imaging:  Scheduled Meds:   . ciprofloxacin  400 mg Intravenous BID  . metronidazole  500 mg Intravenous Q8H  . potassium chloride  10 mEq Intravenous Q1 Hr x 2  . saccharomyces boulardii  250 mg Oral BID   Continuous Infusions:   . dextrose 5 % and 0.45% NaCl 1,000 mL with potassium chloride 10 mEq infusion 150 mL/hr at 12/07/11 2139   PRN Meds:.acetaminophen, acetaminophen, HYDROmorphone (DILAUDID) injection, hyoscyamine, iohexol, labetalol, ondansetron (ZOFRAN) IV, ondansetron, promethazine   Physical Exam: Filed Vitals:   12/08/11 0657  BP: 164/102  Pulse:   Temp:   Resp:     Intake/Output Summary (Last 24 hours) at 12/08/11 0839 Last data filed at 12/08/11 0617  Gross per 24 hour  Intake 2636.5 ml  Output     13 ml  Net 2623.5 ml   Wt Readings from Last 3 Encounters:  12/05/11 223 lb 1.7 oz (101.2 kg)  12/01/11 236 lb (107.049 kg)  11/22/11 236 lb (107.049 kg)   Gen'l - ill appearing Aa man who was called to the commode during interview HEENT - C&S clear Cor- RRR Abdomen - hypoactive BS, no guarding, tender more on the left      Assessment/Plan: 1. GI - day #11 - CT angio negative for vascular ischemia, intestine look better. C.Diff PCR negative, O&P, cultures pendnig. Plan Change to MS for pain  control  Change to bentyl on schedule  Continue IVF  Ambulate  2. ID - cultures pending. Last CBC with normal white count. Day #8 antibiotics  3. Renal function - lab pending.   4. HTN - BP elevated - stimulated by pain, cramping. Plan Continue hydralazine prn   Casimiro Needle [facility] - [facility] (o) [phone]; [phone]  12/08/2011, 8:32 AM

## 2011-12-08 NOTE — Progress Notes (Signed)
[facility] Progress Note  Subjective:  No nausea, vomiting, or dizziness.  Says that he had a rough night.  Still with a lot of diarrhea and abdominal pain.  Pain still worse on left side abdomen.  Urine output is at his baseline, according to his report.  Objective:  Vital signs in last 24 hours: Temp:  [98.1 F (36.7 C)-99.6 F (37.6 C)] 99.2 F (37.3 C) (08/16 0541) Pulse Rate:  [68-78] 68  (08/16 0541) Resp:  [18-20] 18  (08/16 0541) BP: (132-169)/(84-106) 164/102 mmHg (08/16 0657) SpO2:  [92 %-100 %] 92 % (08/16 0541) Last BM Date: 12/08/11 General:   Alert, diaphoretic, umcomfortable Heart:  Regular rate and rhythm; no murmurs Pulm:  CTAB. Abdomen:  Soft, nondistended.  BS present but currently hypoactive.  Diffuse TTP > on left side of the abdomen; no R/R/G Extremities:  Without edema. Neurologic:  Alert and  oriented x4;  grossly normal neurologically. Psych:  Alert and cooperative. Normal mood and affect.  Intake/Output from previous day: 08/15 0701 - 08/16 0700 In: 2636.5 [P.O.:544; I.V.:1692.5; IV Piggyback:400] Out: 13 [Urine:6; Stool:7]  Lab Results:  Basename 12/07/11 0630 12/06/11 0550 12/05/11 1524  WBC 9.8 13.6* 15.0*  HGB 13.1 14.0 16.2  HCT 37.8* 39.6 44.8  PLT 97* 104* 113*   BMET  Basename 12/08/11 0655 12/07/11 0630 12/06/11 0550  NA 140 139 143  K 3.3* 3.3* 3.3*  CL 103 105 108  CO2 24 25 23   GLUCOSE 110* 96 116*  BUN 5* 10 10  CREATININE 1.01 1.11 0.97  CALCIUM 9.0 8.5 8.6   LFT  Basename 12/06/11 0550  PROT 5.8*  ALBUMIN 3.0*  AST 11  ALT 9  ALKPHOS 39  BILITOT 0.4  BILIDIR --  IBILI --   Assessment / Plan: *Prolonged GI illness of n/v/d and abdominal pain despite abx (day 8 cipro and Flagyl) in suspected food poisoning.  WBCs normalized.  CT angio negative for ischemia.  ? If he has antibiotic associated diarrhea at this point vs post-infectious course.  Bentyl started TID today by primary service.  -Giardia and  crypto EIA still in process; follow-up results  -Will discontinue cipro and flagyl; give xifaxan 550mg  TID for 14 days (when discharged prescription will not be covered for TID, so it will need to be written as BID but with patient instructed to take three times daily) * Hypokalemia despite IV runs and supplementation.   -Ordered of postassium po and increased IVF K to  -check BMP in AM * Thrombocytopenia; noncritical, but unknown etiology   LOS: 3 days   Lavonda Thal D.  12/08/2011, 10:58 AM

## 2011-12-08 NOTE — Progress Notes (Signed)
Patient seen, examined, and I agree with the above documentation, including the assessment and plan. Still with severe nausea, and phenergan seems to work much better than zofran.  Symptoms are worse at night and sleep has been poor.  No vomiting since yesterday, however Diarrhea remains severe, infectious workup negative. O/P studies pending. For now, I will add low dose IV ativan which can sometimes help with refractory nausea, this can also be used qhs and may help with sleep. Also, given neg infectious workup to date, will start lomotil BID scheduled to try to slow diarrhea Continue IVFs, he remains thirsty and PO intake remains poor due to nausea Abx have been changed to rifaximin.  Will assess response to this change. Continue K supplementation

## 2011-12-08 NOTE — Progress Notes (Signed)
Pt.still c/o pain on left side of abd. With nausea   & diarrhea.Dr.Polite who is covering for DR.Norins made aware.,no orders received ,said to just let DR.NORINS  Made aware  this AM.

## 2011-12-08 NOTE — Progress Notes (Signed)
Subjective: Resting in bed, still with bouts of abdominal pain, frequent stools.  Objective: Vital signs in last 24 hours: Temp:  [98.1 F (36.7 C)-99.6 F (37.6 C)] 99.2 F (37.3 C) (08/16 0541) Pulse Rate:  [68-78] 68  (08/16 0541) Resp:  [18-20] 18  (08/16 0541) BP: (132-169)/(84-106) 164/102 mmHg (08/16 0657) SpO2:  [92 %-100 %] 92 % (08/16 0541) Last BM Date: 12/08/11  Intake/Output from previous day: 08/15 0701 - 08/16 0700 In: 2636.5 [P.O.:544; I.V.:1692.5; IV Piggyback:400] Out: 13 [Urine:6; Stool:7] Intake/Output this shift:    General appearance: alert, cooperative, fatigued and mild distress Abdomen: soft, +BS, reports of freq bm's VSS WBC trending down,running low grade temp 99.2   Lab Results:   Basename 12/07/11 0630 12/06/11 0550  WBC 9.8 13.6*  HGB 13.1 14.0  HCT 37.8* 39.6  PLT 97* 104*   BMET  Basename 12/08/11 0655 12/07/11 0630  NA 140 139  K 3.3* 3.3*  CL 103 105  CO2 24 25  GLUCOSE 110* 96  BUN 5* 10  CREATININE 1.01 1.11  CALCIUM 9.0 8.5   PT/INR No results found for this basename: LABPROT:2,INR:2 in the last 72 hours ABG No results found for this basename: PHART:2,PCO2:2,PO2:2,HCO3:2 in the last 72 hours  Studies/Results: Ct Angio Abd/pel W/ And/or W/o  12/07/2011  *RADIOLOGY REPORT*  Clinical Data: Left side abdominal pain with nausea, vomiting and diarrhea.  CT ANGIOGRAPHY ABDOMEN AND PELVIS WITH CONTRAST  Technique:  Multidetector CT imaging of the abdomen and pelvis was performed using the standard protocol during bolus administration of intravenous contrast.  Multiplanar reconstructed images including MIPs were obtained and reviewed to evaluate the vascular anatomy.  Comparison: CT abdomen and pelvis 12/01/2011 and 12/05/2011.  Findings: Dependent atelectatic change is seen in the lung bases. There is no pleural or pericardial effusion.  Scattered atherosclerotic vascular disease is seen in the descending abdominal aorta without  aneurysm.  There is no dissection.  The celiac axis, superior mesenteric artery and inferior mesenteric artery are widely patent.  Accessory right renal artery to the lower pole is noted.  The renal arteries are widely patent.  No pneumatosis, portal venous gas or free intraperitoneal air is identified.  Wall thickening of small bowel loops has markedly improved since the most recent study.  No free or focal fluid collection is identified. Bowel loops are normal in caliber.  The gallbladder is decompressed but otherwise unremarkable.  The liver, spleen, adrenal glands, pancreas and right kidney appear normal.  Nonobstructing stone upper pole left kidney and low attenuation lesions off the left kidney most consistent with cysts are again identified.  The colon and stomach appear normal.  There is no lymphadenopathy.  No focal bony abnormality. Small fat containing umbilical hernia is noted.  IMPRESSION:  1.  Negative for evidence of ischemia.  Mild, scattered atherosclerotic vascular disease of the descending abdominal aorta is identified. There is no aneurysm or notable stenosis of major branch vessels. 2.  Improved appearance of the small bowel with mild thickening of a few scattered loops seen on today's exam. 3.  Nonobstructing stone upper pole left kidney. 4.  Small fat containing umbilical hernia.  Original Report Authenticated By: Bernadene Bell. Maricela Curet, M.D.    Anti-infectives: Anti-infectives     Start     Dose/Rate Route Frequency Ordered Stop   12/05/11 2359   metroNIDAZOLE (FLAGYL) IVPB 500 mg        500 mg 100 mL/hr over 60 Minutes Intravenous 3 times per day 12/05/11  2322     12/05/11 2359   ciprofloxacin (CIPRO) IVPB 400 mg        400 mg 200 mL/hr over 60 Minutes Intravenous 2 times daily 12/05/11 2326            Assessment/Plan: s/p * No surgery found * 1. Continue with current management per medicine team  We will sign off at this point as no indication for surgical intervention  is present.  LOS: 3 days    Trelyn Vanderlinde 12/08/2011

## 2011-12-09 DIAGNOSIS — K5289 Other specified noninfective gastroenteritis and colitis: Secondary | ICD-10-CM

## 2011-12-09 LAB — BASIC METABOLIC PANEL
BUN: 4 mg/dL — ABNORMAL LOW (ref 6–23)
CO2: 27 mEq/L (ref 19–32)
Calcium: 9.5 mg/dL (ref 8.4–10.5)
Chloride: 103 mEq/L (ref 96–112)
Creatinine, Ser: 1.16 mg/dL (ref 0.50–1.35)
GFR calc Af Amer: 84 mL/min — ABNORMAL LOW (ref >90)
GFR calc non Af Amer: 73 mL/min — ABNORMAL LOW (ref >90)
Glucose, Bld: 126 mg/dL — ABNORMAL HIGH (ref 70–99)
Potassium: 4.3 mEq/L (ref 3.5–5.1)
Sodium: 140 mEq/L (ref 135–145)

## 2011-12-09 MED ORDER — DILTIAZEM HCL ER COATED BEADS 360 MG PO CP24
360.0000 mg | ORAL_CAPSULE | Freq: Every day | ORAL | Status: DC
  Administered 2011-12-09 – 2011-12-16 (×8): 360 mg via ORAL
  Filled 2011-12-09 (×8): qty 1

## 2011-12-09 NOTE — Progress Notes (Signed)
B/P=169/102;DR.WALSH was called & made aware & no orders received.

## 2011-12-09 NOTE — Progress Notes (Signed)
Subjective: CROOS COVER [facility] Patient seems to be feeling somewhat better today. Has had 5 small volume BM's. 12 BM's yesterday. No nausea or vomiting. Some LLQ pain. Objective: Vital signs in last 24 hours: Temp:  [98.1 F (36.7 C)-98.8 F (37.1 C)] 98.1 F (36.7 C) (08/17 1400) Pulse Rate:  [66-88] 88  (08/17 1400) Resp:  [18-20] 18  (08/17 1400) BP: (132-169)/(92-105) 132/92 mmHg (08/17 1400) SpO2:  [93 %-96 %] 96 % (08/17 1400) Weight:  [100.4 kg (221 lb 5.5 oz)] 100.4 kg (221 lb 5.5 oz) (08/17 0500) Last BM Date: 12/09/11 Intake/Output from previous day: 08/16 0701 - 08/17 0700 In: 1830 [P.O.:180; I.V.:1650] Out: 7 [Urine:3; Stool:4] Intake/Output this shift: Total I/O In: 1145 [I.V.:1145] Out: -  General appearance: alert, cooperative, appears stated age and no distress Resp: clear to auscultation bilaterally Cardio: regular rate and rhythm, S1, S2 normal, no murmur, click, rub or gallop GI: soft, mild LLQ tenderness on palpation with gaurding but without rebound or rigidity; bowel sounds normal; no masses,  no organomegaly Extremities: extremities normal, atraumatic, no cyanosis or edema Lab Results:  Basename 12/07/11 0630  WBC 9.8  HGB 13.1  HCT 37.8*  PLT 97*  BMET  Basename 12/09/11 0551 12/08/11 0655 12/07/11 0630  NA 140 140 139  K 4.3 3.3* 3.3*  CL 103 103 105  CO2 27 24 25   GLUCOSE 126* 110* 96  BUN 4* 5* 10  CREATININE 1.16 1.01 1.11  CALCIUM 9.5 9.0 8.5   Medications: I have reviewed the patient's current medications.  Assessment/Plan: Acute diarrhea-seems to be improving on Xifaxan and Florastor. Also on Bentyl and Lomotil.Continue present care.   LOS: 4 days   Joylene Wescott 12/09/2011, 5:12 PM

## 2011-12-09 NOTE — Progress Notes (Signed)
Subjective: [name] reports minimal improvement: less diarrhea but continued Nausea. He hasn't had any emesis since yesterday. He does have some abdominal discomfort.   Objective: Lab: Lab Results  Component Value Date   WBC 9.8 12/07/2011   HGB 13.1 12/07/2011   HCT 37.8* 12/07/2011   MCV 87.5 12/07/2011   PLT 97* 12/07/2011   BMET    Component Value Date/Time   NA 140 12/09/2011 0551   K 4.3 12/09/2011 0551   CL 103 12/09/2011 0551   CO2 27 12/09/2011 0551   GLUCOSE 126* 12/09/2011 0551   BUN 4* 12/09/2011 0551   CREATININE 1.16 12/09/2011 0551   CALCIUM 9.5 12/09/2011 0551   GFRNONAA 73* 12/09/2011 0551   GFRAA 84* 12/09/2011 0551     Imaging:  Scheduled Meds:   . dicyclomine  10 mg Oral TID AC & HS  . diphenoxylate-atropine  1 tablet Oral BID  . enoxaparin (LOVENOX) injection  40 mg Subcutaneous Q24H  . potassium chloride  40 mEq Oral BID  . rifaximin  550 mg Oral TID  . saccharomyces boulardii  250 mg Oral BID  . DISCONTD: ciprofloxacin  400 mg Intravenous BID  . DISCONTD: metronidazole  500 mg Intravenous Q8H   Continuous Infusions:   . dextrose 5 % and 0.45% NaCl 1,000 mL with potassium chloride 20 mEq infusion 150 mL/hr at 12/09/11 0358  . DISCONTD: dextrose 5 % and 0.45% NaCl 1,000 mL with potassium chloride 10 mEq infusion 150 mL/hr at 12/07/11 2139   PRN Meds:.acetaminophen, acetaminophen, labetalol, LORazepam, morphine injection, ondansetron (ZOFRAN) IV, ondansetron, promethazine, DISCONTD: ondansetron (ZOFRAN) IV, DISCONTD: ondansetron   Physical Exam: Filed Vitals:   12/09/11 0500  BP: 158/105  Pulse: 69  Temp: 98.7 F (37.1 C)  Resp: 20    Intake/Output Summary (Last 24 hours) at 12/09/11 4098 Last data filed at 12/09/11 0600  Gross per 24 hour  Intake   1830 ml  Output      7 ml  Net   1823 ml   Total this adm: +5.6 liters  Gen'l WNWD AA man HEENT - C&S clear Cor- RRR Pulm - normal respirations Abd - hypoactive BS, tender to palpation  lower quadrants with worse pain at LLQ, no guarding      Assessment/Plan: 1. GI - Day #12 - no etiology other than food poisoning. Appreciate GI assistance. Plan - continue Xifaxin, IVF, lomotil, Phenergan or zofran, ativan, bentyl  Will advance diet to full liquid  2. ID- no new culture reports  3. Renal - normal Creatinine  4. HTN- running high. Plan  Resume Diltiazem 360 once a day   [employer] IM Call-grp - [facility] ([phone]; ([phone]  12/09/2011, 9:18 AM

## 2011-12-10 MED ORDER — PROMETHAZINE HCL 25 MG/ML IJ SOLN
12.5000 mg | INTRAMUSCULAR | Status: DC | PRN
  Administered 2011-12-10 – 2011-12-15 (×15): 25 mg via INTRAVENOUS
  Filled 2011-12-10 (×15): qty 1

## 2011-12-10 MED ORDER — PANTOPRAZOLE SODIUM 40 MG IV SOLR
40.0000 mg | Freq: Two times a day (BID) | INTRAVENOUS | Status: DC
  Administered 2011-12-10 – 2011-12-11 (×4): 40 mg via INTRAVENOUS
  Filled 2011-12-10 (×6): qty 40

## 2011-12-10 NOTE — Progress Notes (Signed)
Subjective: CROS SCOVER [facility] Since I last evaluated the patient, he has not had much improvement. He has not recieved his medications on a scheduled basis and complains of ongoing abdominal pain in the LLQ with nausea and vomiting last night x 2. He has had 5 loose BM's since last night but the stool volume seems to be decreasing. He rates the LLQ at a 8/10 and has required "pain medication" for this. No fever, chills or rigors.   Objective: Vital signs in last 24 hours: Temp:  [97.6 F (36.4 C)-98.3 F (36.8 C)] 97.6 F (36.4 C) (08/18 0519) Pulse Rate:  [74-88] 80  (08/18 0519) Resp:  [17-18] 17  (08/18 0519) BP: (113-136)/(76-93) 136/93 mmHg (08/18 0519) SpO2:  [95 %-97 %] 95 % (08/18 0519) Weight:  [100.154 kg (220 lb 12.8 oz)] 100.154 kg (220 lb 12.8 oz) (08/18 0519) Last BM Date: 12/09/11  Intake/Output from previous day: 08/17 0701 - 08/18 0700 In: 1422.5 [I.V.:1422.5] Out: -  Intake/Output this shift: Total I/O In: 240 [P.O.:240] Out: -  GPE: Anxious, middle aged black male, slightly distressed secondary to the abdominal pain.  NECK: Supple. CHEST: CTA S1 and S2 regular.  ABDOMEN: Obese with LLQ tenderness on palpation with gaurding but without rebound or rigidity. No HSM.  Lab Results: No results found for this basename: WBC:3,HGB:3,HCT:3,PLT:3 in the last 72 hours BMET  [facility] 12/09/11 0551 12/08/11 0655  NA 140 140  K 4.3 3.3*  CL 103 103  CO2 27 24  GLUCOSE 126* 110*  BUN 4* 5*  CREATININE 1.16 1.01  CALCIUM 9.5 9.0   Medications: I have reviewed the patient's current medications.  Assessment/Plan: 1) Acute diarrhea with enteritis on CT-improving on Xifaxan. He needs to get this every 8 hours. I have discussed this with his nurse at length. 2) Left sided nephrolithiasis without hydronephrosis. 3) HTN: on Labetalol.  LOS: 5 days   Taryn Nave 12/10/2011, 12:48 PM

## 2011-12-10 NOTE — Progress Notes (Signed)
Subjective: Continued Nausea that has been persistent and severe, unresponsive to zofran PO or IV but responds to phenergan and ativan. Diarrhea has slowed. He reports that his nausea is similar to when he had trouble with hyperacidity.. He has been ambulating.  Objective: Lab: Lab Results  Component Value Date   WBC 9.8 12/07/2011   HGB 13.1 12/07/2011   HCT 37.8* 12/07/2011   MCV 87.5 12/07/2011   PLT 97* 12/07/2011   BMET    Component Value Date/Time   NA 140 12/09/2011 0551   K 4.3 12/09/2011 0551   CL 103 12/09/2011 0551   CO2 27 12/09/2011 0551   GLUCOSE 126* 12/09/2011 0551   BUN 4* 12/09/2011 0551   CREATININE 1.16 12/09/2011 0551   CALCIUM 9.5 12/09/2011 0551   GFRNONAA 73* 12/09/2011 0551   GFRAA 84* 12/09/2011 0551     Imaging:  Scheduled Meds:   . dicyclomine  10 mg Oral TID AC & HS  . diltiazem  360 mg Oral Daily  . diphenoxylate-atropine  1 tablet Oral BID  . enoxaparin (LOVENOX) injection  40 mg Subcutaneous Q24H  . rifaximin  550 mg Oral TID  . saccharomyces boulardii  250 mg Oral BID   Continuous Infusions:   . dextrose 5 % and 0.45% NaCl 1,000 mL with potassium chloride 20 mEq infusion 150 mL/hr at 12/10/11 0936   PRN Meds:.acetaminophen, acetaminophen, labetalol, LORazepam, morphine injection, ondansetron (ZOFRAN) IV, ondansetron, promethazine   Physical Exam: Filed Vitals:   12/10/11 0519  BP: 136/93  Pulse: 80  Temp: 97.6 F (36.4 C)  Resp: 17    Intake/Output Summary (Last 24 hours) at 12/10/11 1028 Last data filed at 12/10/11 0900  Gross per 24 hour  Intake 1662.5 ml  Output      0 ml  Net 1662.5 ml   Total this adm +7.2L Weigh - down 2 lb over 5 days.  Sitting up in a chair, no acute distress Cor- RRR Pulm - normal respirations. Neuro - A&O      Assessment/Plan: 1. GI - Day # 13 of symptoms - diarreha is better, nausea is worse. Plan Continue xifaxin, IVF, phenergan at shortened interval  Advance to full diet - he will make bland  choices  PPI IV since he report problems keeping down PO's  4. HTN - better control.   Casimiro Needle Kaysie [facility] [place] [facility] Call-grp - [facility] ([phone]; ([phone]  12/10/2011, 10:26 AM

## 2011-12-11 LAB — BASIC METABOLIC PANEL
BUN: 10 mg/dL (ref 6–23)
CO2: 24 mEq/L (ref 19–32)
Calcium: 9.3 mg/dL (ref 8.4–10.5)
Chloride: 103 mEq/L (ref 96–112)
Creatinine, Ser: 1.1 mg/dL (ref 0.50–1.35)
GFR calc Af Amer: >90 mL/min (ref >90)
GFR calc non Af Amer: 78 mL/min — ABNORMAL LOW (ref >90)
Glucose, Bld: 109 mg/dL — ABNORMAL HIGH (ref 70–99)
Potassium: 4.2 mEq/L (ref 3.5–5.1)
Sodium: 138 mEq/L (ref 135–145)

## 2011-12-11 LAB — OVA AND PARASITE EXAMINATION

## 2011-12-11 LAB — STOOL CULTURE

## 2011-12-11 NOTE — Progress Notes (Signed)
     [facility] Daily Rounding Note 12/11/2011, 8:16 AM  SUBJECTIVE:       Tolerated solid food.  Improvement in nausea, abd pain, diarrhea. Still having loose, watery stools of varying volume about 10 times yesterday.  Tolerating solids since mid day 8/18 but still episodic nausea and limited vomiting. Left sided pain persists.  Used 4 mg Morphine 6 times yesterday, 3 times thus far today, Phenergen 100 mg yesterday, 25 mg at 1 AM today.  Still on IVF at 150 ml/hr and IV Protonix.  Urinating avout average baseline volume   OBJECTIVE:         Vital signs in last 24 hours:    Temp:  [98 F (36.7 C)-98.8 F (37.1 C)] 98 F (36.7 C) (08/19 0617) Pulse Rate:  [69-96] 69  (08/19 0617) Resp:  [17] 17  (08/19 0617) BP: (130-143)/(78-91) 143/91 mmHg (08/19 0617) SpO2:  [94 %-96 %] 96 % (08/19 0617) Weight:  [220 lb 4.8 oz (99.927 kg)] 220 lb 4.8 oz (99.927 kg) (08/19 0617) Last BM Date: 12/10/11 General: looks a little better, still not well   Heart: RRR Chest: clear.  No cough or dyspnea Abdomen: soft, active BS, ND.  Tender without G/R, on left  Extremities: no edema Neuro/Psych:  Pleasant.  Not confused.  Affect more animated.   Intake/Output from previous day: 08/18 0701 - 08/19 0700 In: 3752.5 [P.O.:240; I.V.:3512.5] Out: -    Lab Results: No results found for this basename: WBC:3,HGB:3,HCT:3,PLT:3 in the last 72 hours BMET  Basename 12/11/11 0552 12/09/11 0551  NA 138 140  K 4.2 4.3  CL 103 103  CO2 24 27  GLUCOSE 109* 126*  BUN 10 4*  CREATININE 1.10 1.16  CALCIUM 9.3 9.5   ASSESMENT: *  Enteritis, presumed food borne etiology, though organism/toxin not identified on multiple stool studies. Today begins the 3rd week since onset of sxs.  Failed therapy with IV/PO Cipro and Flagyl.  Started Xifaxan, Lomotil, Bentyl, PRN  Ativan,  8/16.  Ongoing Florastor.  Seems finally to be improving but continues to require frequent PRN meds.  *  Hypokalemia.  Resolved.  Has K in  the IVF   PLAN: *  Continue supportive care. Not ready for discharge yet.   LOS: 6 days   Jennye Moccasin  12/11/2011, 8:16 AM Pager: [phone]

## 2011-12-11 NOTE — Progress Notes (Signed)
I have taken an interval history, reviewed the chart and examined the patient. I agree with the extender's note, impression and recommendations. Enteritis, presumed infectious, improving but slowly resolving. Continue current management.  Venita Lick. Russella Dar MD [facility]

## 2011-12-11 NOTE — Progress Notes (Signed)
Subjective: Reports that he was able to eat regular food, nausea is better, diarrhea is less frequent, abdominal cramping is less - overall improvement!  Objective: Lab: Lab Results  Component Value Date   WBC 9.8 12/07/2011   HGB 13.1 12/07/2011   HCT 37.8* 12/07/2011   MCV 87.5 12/07/2011   PLT 97* 12/07/2011   BMET    Component Value Date/Time   NA 140 12/09/2011 0551   K 4.3 12/09/2011 0551   CL 103 12/09/2011 0551   CO2 27 12/09/2011 0551   GLUCOSE 126* 12/09/2011 0551   BUN 4* 12/09/2011 0551   CREATININE 1.16 12/09/2011 0551   CALCIUM 9.5 12/09/2011 0551   GFRNONAA 73* 12/09/2011 0551   GFRAA 84* 12/09/2011 0551     Imaging: no new imaging  Scheduled Meds:   . dicyclomine  10 mg Oral TID AC & HS  . diltiazem  360 mg Oral Daily  . diphenoxylate-atropine  1 tablet Oral BID  . enoxaparin (LOVENOX) injection  40 mg Subcutaneous Q24H  . pantoprazole (PROTONIX) IV  40 mg Intravenous Q12H  . rifaximin  550 mg Oral TID  . saccharomyces boulardii  250 mg Oral BID   Continuous Infusions:   . dextrose 5 % and 0.45% NaCl 1,000 mL with potassium chloride 20 mEq infusion 150 mL/hr at 12/11/11 0100   PRN Meds:.acetaminophen, acetaminophen, labetalol, LORazepam, morphine injection, ondansetron (ZOFRAN) IV, ondansetron, promethazine, DISCONTD: promethazine   Physical Exam: Filed Vitals:   12/11/11 0617  BP: 143/91  Pulse: 69  Temp: 98 F (36.7 C)  Resp: 17  I&O totally meaningless with no output recorded. Weigth 220, no change from 8/18 Easily awakened. Alert and oriented Cor- RRR PUlm normal respirations Abd - hypoactive but present bowel sounds, no guarding or rebound.     Assessment/Plan: 1. GI - Day #14 of symptoms from food poisoning with general improvement Plan Continue all present medications.  Keep PPI IV for today.  2. ID - no new cultures. No fever.  3. Renal - no new labs Plan Bmet in AM  4. HTN - better control.     Casimiro Needle Jakye Mullens [place]  IM Call-grp - [facility] ([phone]; ([phone]  12/11/2011, 6:52 AM

## 2011-12-12 LAB — BASIC METABOLIC PANEL
BUN: 11 mg/dL (ref 6–23)
CO2: 25 mEq/L (ref 19–32)
Calcium: 9.1 mg/dL (ref 8.4–10.5)
Chloride: 104 mEq/L (ref 96–112)
Creatinine, Ser: 1.11 mg/dL (ref 0.50–1.35)
GFR calc Af Amer: 89 mL/min — ABNORMAL LOW (ref >90)
GFR calc non Af Amer: 77 mL/min — ABNORMAL LOW (ref >90)
Glucose, Bld: 110 mg/dL — ABNORMAL HIGH (ref 70–99)
Potassium: 4.3 mEq/L (ref 3.5–5.1)
Sodium: 137 mEq/L (ref 135–145)

## 2011-12-12 MED ORDER — PROMETHAZINE HCL 25 MG PO TABS
12.5000 mg | ORAL_TABLET | ORAL | Status: DC | PRN
  Administered 2011-12-12 – 2011-12-15 (×7): 12.5 mg via ORAL
  Filled 2011-12-12 (×6): qty 1

## 2011-12-12 MED ORDER — POLYVINYL ALCOHOL 1.4 % OP SOLN
2.0000 [drp] | Freq: Four times a day (QID) | OPHTHALMIC | Status: DC
  Filled 2011-12-12: qty 15

## 2011-12-12 MED ORDER — MORPHINE SULFATE 15 MG PO TABS
15.0000 mg | ORAL_TABLET | ORAL | Status: DC | PRN
  Administered 2011-12-13 – 2011-12-15 (×6): 15 mg via ORAL
  Filled 2011-12-12 (×7): qty 1

## 2011-12-12 MED ORDER — PROMETHAZINE HCL 25 MG PO TABS
25.0000 mg | ORAL_TABLET | Freq: Three times a day (TID) | ORAL | Status: DC
  Administered 2011-12-12: 25 mg via ORAL
  Filled 2011-12-12 (×4): qty 1

## 2011-12-12 MED ORDER — LORAZEPAM 0.5 MG PO TABS
0.5000 mg | ORAL_TABLET | ORAL | Status: DC | PRN
  Administered 2011-12-12: 0.5 mg via ORAL
  Filled 2011-12-12: qty 1

## 2011-12-12 MED ORDER — PANTOPRAZOLE SODIUM 40 MG PO TBEC
40.0000 mg | DELAYED_RELEASE_TABLET | Freq: Every day | ORAL | Status: DC
  Administered 2011-12-12 – 2011-12-14 (×3): 40 mg via ORAL
  Filled 2011-12-12 (×3): qty 1

## 2011-12-12 NOTE — Progress Notes (Signed)
     [facility] Daily Rounding Note 12/12/2011, 8:26 AM  SUBJECTIVE:       Ongoing nausea and abd cramps.  Pain intermittent and worsened by POs.  Nausea is inhibiting po intake.   28 mg Morphine yesterday.  8 mg so far today.  5 to 6 loose BMs yesterday.  Fluids still at 150/hour and he says urine output is normal. Overall feels a lot better.   OBJECTIVE:         Vital signs in last 24 hours:    Temp:  [98.1 F (36.7 C)-99.1 F (37.3 C)] 98.6 F (37 C) (08/20 0531) Pulse Rate:  [78-88] 78  (08/20 0531) Resp:  [17-18] 17  (08/20 0531) BP: (117-137)/(76-92) 117/76 mmHg (08/20 0531) SpO2:  [93 %-97 %] 93 % (08/20 0531) Weight:  [224 lb 8 oz (101.833 kg)] 224 lb 8 oz (101.833 kg) (08/20 0531) Last BM Date: 12/12/11 General: looks better, still not well.   Heart: RRR Chest: clear B.  No dyspnea Abdomen: soft, hypoactive BS.  Tender on left, no G/R  Extremities: no pedal edema Neuro/Psych:  Pleasant, not confused or anxious.   Intake/Output from previous day: 08/19 0701 - 08/20 0700 In: 3920 [P.O.:470; I.V.:3450] Out: 1 [Urine:1]  Intake/Output this shift:    Lab Results:  BMET  Basename 12/12/11 0615 12/11/11 0552  NA 137 138  K 4.3 4.2  CL 104 103  CO2 25 24  GLUCOSE 110* 109*  BUN 11 10  CREATININE 1.11 1.10  CALCIUM 9.1 9.3    ASSESMENT: * Enteritis, presumed food borne etiology, though organism/toxin not identified on multiple stool studies. Into the 3rd week since onset of sxs. Failed therapy with IV/PO Cipro and Flagyl. Started Xifaxan, Lomotil, Bentyl, PRN Ativan, 8/16. Ongoing Florastor. Diarrhea better, but is getting BID Lomotil.  Still requiring significant doses of Morphine, which could be driving some of the nausea.    PLAN: *  Continue supportive care.  Since po intake is subpar, do not think he is ready for home.  *  Change to po Protonix    LOS: 7 days   Jennye Moccasin  12/12/2011, 8:26 AM Pager: [phone]

## 2011-12-12 NOTE — Progress Notes (Signed)
I have taken an interval history, reviewed the chart and examined the patient. I agree with the extender's note, impression and recommendations. Would use anti-nausea meds before meals and all other meds.  Venita Lick. Russella Dar MD [facility]

## 2011-12-12 NOTE — Progress Notes (Signed)
Subjective: Appreciate GI note. Had a fair night but is having recurrent nausea and cramping now. He reports that he scratched his right eye during the night and has some pain and blurred vision. This has happened in the past secondary to loss of feeling right face.  Objective: Lab: Lab Results  Component Value Date   WBC 9.8 12/07/2011   HGB 13.1 12/07/2011   HCT 37.8* 12/07/2011   MCV 87.5 12/07/2011   PLT 97* 12/07/2011   BMET    Component Value Date/Time   NA 138 12/11/2011 0552   K 4.2 12/11/2011 0552   CL 103 12/11/2011 0552   CO2 24 12/11/2011 0552   GLUCOSE 109* 12/11/2011 0552   BUN 10 12/11/2011 0552   CREATININE 1.10 12/11/2011 0552   CALCIUM 9.3 12/11/2011 0552   GFRNONAA 78* 12/11/2011 0552   GFRAA >90 12/11/2011 0552     Imaging: no new imaging  Scheduled Meds:   . dicyclomine  10 mg Oral TID AC & HS  . diltiazem  360 mg Oral Daily  . diphenoxylate-atropine  1 tablet Oral BID  . enoxaparin (LOVENOX) injection  40 mg Subcutaneous Q24H  . pantoprazole (PROTONIX) IV  40 mg Intravenous Q12H  . rifaximin  550 mg Oral TID  . saccharomyces boulardii  250 mg Oral BID   Continuous Infusions:   . dextrose 5 % and 0.45% NaCl 1,000 mL with potassium chloride 20 mEq infusion 150 mL/hr at 12/12/11 0452   PRN Meds:.acetaminophen, acetaminophen, labetalol, LORazepam, morphine injection, ondansetron (ZOFRAN) IV, ondansetron, promethazine   Physical Exam: Filed Vitals:   12/12/11 0531  BP: 117/76  Pulse: 78  Temp: 98.6 F (37 C)  Resp: 17   Wt Readings from Last 3 Encounters:  12/12/11 224 lb 8 oz (101.833 kg)  12/01/11 236 lb (107.049 kg)  11/22/11 236 lb (107.049 kg)   Gen'l - heavyset AA man in mild discomfort with abdominal cramping and eye pain HEENT - OD-arcus senilis, normal appearing pupil and iris on gross observation (no ophthalmoscope). C&S clear Cor- RRR PUlm - noraml respirations Abd - BS+ x 4 but soft, no guarding but tender to palpation more on the  left.     Assessment/Plan: 1. GI Day #15 of symptoms. Slow progress but still requiring IV pain meds and antiemetics Plan Will make all meds available PO as well as IV. He is instructed to try POs first  3. Renal - stable  4. HTN controlled  5. Ophthal - scratched his eye now with pain and blurred vision Plan - liquid tears  Patch the eye.   Casimiro Needle [facility]ins [place] [facility] Call-grp - [facility] ([phone]; ([phone]  12/12/2011, 6:56 AM

## 2011-12-13 MED ORDER — MORPHINE SULFATE 2 MG/ML IJ SOLN: 1.0000 mg | INTRAMUSCULAR | Status: DC | PRN

## 2011-12-13 MED ORDER — PROMETHAZINE HCL 25 MG/ML IJ SOLN
12.5000 mg | Freq: Three times a day (TID) | INTRAMUSCULAR | Status: DC
  Administered 2011-12-13 (×3): 12.5 mg via INTRAVENOUS
  Filled 2011-12-13 (×7): qty 1

## 2011-12-13 NOTE — Progress Notes (Signed)
Subjective: RN reports patient had a tough day - trouble keeping down POs. He reports doing much better: has had several formed stools. He reports his cramping is worse before BMs. Nausea is the big issue for him- still a problem.  Objective: Lab: Lab Results  Component Value Date   WBC 9.8 12/07/2011   HGB 13.1 12/07/2011   HCT 37.8* 12/07/2011   MCV 87.5 12/07/2011   PLT 97* 12/07/2011   BMET    Component Value Date/Time   NA 137 12/12/2011 0615   K 4.3 12/12/2011 0615   CL 104 12/12/2011 0615   CO2 25 12/12/2011 0615   GLUCOSE 110* 12/12/2011 0615   BUN 11 12/12/2011 0615   CREATININE 1.11 12/12/2011 0615   CALCIUM 9.1 12/12/2011 0615   GFRNONAA 77* 12/12/2011 0615   GFRAA 89* 12/12/2011 0615     Imaging: no new imaging  Scheduled Meds:   . diltiazem  360 mg Oral Daily  . diphenoxylate-atropine  1 tablet Oral BID  . enoxaparin (LOVENOX) injection  40 mg Subcutaneous Q24H  . pantoprazole  40 mg Oral Q0600  . promethazine  25 mg Oral TID  . rifaximin  550 mg Oral TID  . DISCONTD: dicyclomine  10 mg Oral TID AC & HS  . DISCONTD: pantoprazole (PROTONIX) IV  40 mg Intravenous Q12H  . DISCONTD: polyvinyl alcohol  2 drop Right Eye Q6H  . DISCONTD: saccharomyces boulardii  250 mg Oral BID   Continuous Infusions:   . dextrose 5 % and 0.45% NaCl 1,000 mL with potassium chloride 20 mEq infusion 125 mL/hr at 12/13/11 0645   PRN Meds:.acetaminophen, acetaminophen, labetalol, LORazepam, LORazepam, morphine, morphine injection, promethazine, promethazine   Physical Exam: Filed Vitals:   12/13/11 0600  BP: 158/108  Pulse: 76  Temp: 97.8 F (36.6 C)  Resp: 20   Wt up to 226 Gen'- WNWD AA man in no distress at interview Abd- BS+, soft  meds : MS x 5  On 8/20, Ativan x 2    Assessment/Plan: 1.GI -  Day #16. Diarrhea seems resolved. Cramping pain preceeds BM and he is still requiring too frequent dosing of MS. Nausea is a problem but does respond to phenergan Plan Pharmacy  consult re: meds and timing of administration  Simplify phenergan regimen to 12.5 mg q 4 prn but he is instructed to ask for it 15-30 min before meals  3-5 stable   Casimiro Needle [facility] [place] [facility] Call-grp - [facility] (o) [phone]; ([phone]  12/13/2011, 7:48 AM

## 2011-12-13 NOTE — Progress Notes (Signed)
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 He sampled fried crab cakes and tuna (his assistance did not sample the tuna) during this convention. Started feeling bad on his way home from the convention.  For the last several weeks he has been experiencing symptoms of severe reflux, nausea, vomiting, and diarrhea. He was initially prescribed Cipro/Flagyl which the patient either failed the regimen or failed to tolerate the medications. As soon as he would take the Cipro/Flagyl he would vomit it back up. He states he consumed no alcohol during this antibiotic course that could have produced a disulfuram-like reaction. The side-effects of the Cipro/Flagyl could have contributed significantly to his N/V if not taken with food. Patient came back to [facility] ER with N/V/D and severe cramping abdominal pain and is now taking Rifaxamin. Stools: No ova, parasites with normal flora. Cdiff negative. CT scan negative.  [name] said he had no problems with nausea taking his regularly scheduled home medications. These symptoms began after his convention and have continued after the initiation of his course of Cipro/Flagyl. He already tries to take is home Prevacid on an empty stomach because he achieves better acid control. We also discussed minimizing the pain medication due to the side effects of nausea. I do not feel I have anything further to add as far as the timing of his home medications.  Recommendations: 1. Check H.Pylori with h/o severe acid reflux? 2. Increase PPI to BID 3. Try a SHORT course of Reglan to assist with acid reflux and digestion 4. D/c Morphine and use Levsin for stomach cramps.   Gibson Lad S. Merilynn Finland, PharmD, BCPS Clinical Staff Pharmacist Pager  [phone]  Misty Stanley Stillinger 12/13/2011,2:39 PM

## 2011-12-13 NOTE — Progress Notes (Signed)
INITIAL ADULT NUTRITION ASSESSMENT Date: 12/13/2011   Time: 10:16 AM Reason for Assessment: Nutrition Risk  INTERVENTION: 1. Magic cup TID between meals, each supplement provides 290 kcal and 9 grams of protein. 2. RD will continue to follow   Male 51 y.o.  Dx: Enteritis  Hx:  Past Medical History  Diagnosis Date  . Hypertension   . Unspecified intestinal obstruction   . Unspecified asthma   . Chest pain, atypical   . Pulmonary nodule   . Nontoxic multinodular goiter   . Painful respiration   . Dyspnea on exertion   . Trigeminal neuralgia   . Cluster headache   . Hyperlipidemia   . GERD (gastroesophageal reflux disease)   . Complication of anesthesia     difficult waking up"  . Food poisoning 11/2011    Related Meds:     . diltiazem  360 mg Oral Daily  . enoxaparin (LOVENOX) injection  40 mg Subcutaneous Q24H  . pantoprazole  40 mg Oral Q0600  . promethazine  12.5 mg Intravenous TID  . rifaximin  550 mg Oral TID  . DISCONTD: dicyclomine  10 mg Oral TID AC & HS  . DISCONTD: diphenoxylate-atropine  1 tablet Oral BID  . DISCONTD: promethazine  25 mg Oral TID  . DISCONTD: saccharomyces boulardii  250 mg Oral BID     Ht: 5\' 10"  (177.8 cm)  Wt: 226 lb 3.2 oz (102.604 kg)  Ideal Wt: 75.5 kg  % Ideal Wt: 135%  Usual Wt:  Wt Readings from Last 5 Encounters:  12/13/11 226 lb 3.2 oz (102.604 kg)  12/01/11 236 lb (107.049 kg)  11/22/11 236 lb (107.049 kg)  11/13/11 234 lb (106.142 kg)  07/17/11 238 lb (107.956 kg)    % Usual Wt: 96%  Body mass index is 32.46 kg/(m^2). Pt is obese class 1 per current BMI   Food/Nutrition Related Hx: Pt with decreased intake and weight loss.   Labs:  CMP     Component Value Date/Time   NA 137 12/12/2011 0615   K 4.3 12/12/2011 0615   CL 104 12/12/2011 0615   CO2 25 12/12/2011 0615   GLUCOSE 110* 12/12/2011 0615   BUN 11 12/12/2011 0615   CREATININE 1.11 12/12/2011 0615   CALCIUM 9.1 12/12/2011 0615   PROT 5.8* 12/06/2011 0550     ALBUMIN 3.0* 12/06/2011 0550   AST 11 12/06/2011 0550   ALT 9 12/06/2011 0550   ALKPHOS 39 12/06/2011 0550   BILITOT 0.4 12/06/2011 0550   GFRNONAA 77* 12/12/2011 0615   GFRAA 89* 12/12/2011 0615    Intake/Output Summary (Last 24 hours) at 12/13/11 1019 Last data filed at 12/13/11 0059  Gross per 24 hour  Intake   2455 ml  Output      0 ml  Net   2455 ml     Diet Order: General  Supplements/Tube Feeding: none  IVF:    dextrose 5 % and 0.45% NaCl 1,000 mL with potassium chloride 20 mEq infusion Last Rate: 75 mL/hr at 12/13/11 0804    Estimated Nutritional Needs:   Kcal: 2000-2200 Protein: 75-85 gm  Fluid: 2-2.2 L   Pt with recent admit for food poisoning, for readmitted one day after d/c for N/V/D. Pt now with about 2 weeks of N/V/D. Pt with 10 lbs, (4.2%) weight loss since August 8th, severe weight loss. Pt believes he has lost additional weight as he was weighed with extra items on bed. Pt with very limited intake, NPO or CL  for >5 days and frequent vomiting, providing less then 50% of estimated nutrition needs Pt meets criteria for severe malnutrition in the context of acute illness or injury 2/2 weight loss and intake as described above.  Pt still with nausea, vomited after chicken tenders the other day, did not tolerate tomato sauce. Pt with limited tolerance of liquids. Willing to try magic cup with meals.   NUTRITION DIAGNOSIS: -Inadequate oral intake (NI-2.1).  Status: Ongoing  RELATED TO: N/V, restricted diet   AS EVIDENCE BY: weight loss  MONITORING/EVALUATION(Goals): Goal: PO intake of meals and supplements to meet >90% of estimated nutrition needs Monitor: PO intake, weight, N/V  EDUCATION NEEDS: -No education needs identified at this time   DOCUMENTATION CODES Per approved criteria  -Severe malnutrition in the context of acute illness or injury   Clarene Duke RD, LDN Pager [phone] After Hours pager [phone]

## 2011-12-13 NOTE — Care Management Note (Signed)
    Page 1 of 1   12/17/2011     7:34:38 PM   CARE MANAGEMENT NOTE 12/17/2011  Patient:  [name], [name]   Account Number:  [id number]  Date Initiated:  12/06/2011  Documentation initiated by:  Letha Cape  Subjective/Objective Assessment:   dx enteritis  admit- lives with spouse.     Action/Plan:   Anticipated DC Date:  12/16/2011   Anticipated DC Plan:  HOME/SELF CARE      DC Planning Services  CM consult      Choice offered to / List presented to:             Status of service:  Completed, signed off Medicare Important Message given?   (If response is "NO", the following Medicare IM given date fields will be blank) Date Medicare IM given:   Date Additional Medicare IM given:    Discharge Disposition:  HOME/SELF CARE  Per UR Regulation:  Reviewed for med. necessity/level of care/duration of stay  If discussed at Long Length of Stay Meetings, dates discussed:   12/12/2011    Comments:  12/17/11 19:33 Letha Cape RN, BSN [phone] pateint dc to home.  12/13/11 10:07 Letha Cape RN, BSN [phone] patient lives with spouse, patient is still having problems with pain, she is on a reg diet, lomotil was stopped, advised to use tylenol for pain, patient is still using significant doses of iv morphine.  Per GI patient may be for possible dc tomorrow.

## 2011-12-13 NOTE — Progress Notes (Signed)
I have taken an interval history, reviewed the chart and examined the patient. I agree with the extender's note, impression and recommendations. His is improving slowly . Continue Xifaxan. Agree with decreasing dose and discontinuing morphine as soon as possible. Adjusting medication timing with phenergan before meals. Convert to phenergan PO or PR soon.  Venita Lick. Russella Dar MD Clement[facility] Graham

## 2011-12-13 NOTE — Plan of Care (Signed)
Problem: Phase III Progression Outcomes Goal: IV/normal saline lock discontinued Outcome: Progressing IVF Goal: Discharge plan remains appropriate-arrangements made Outcome: Progressing Trying to progress to PO medications only.

## 2011-12-13 NOTE — Progress Notes (Signed)
     [facility] Daily Rounding Note 12/13/2011, 9:37 AM  SUBJECTIVE:       Has some nausea this AM, but better with pre meal Phenergen.  Says that at home has had nausea with meds if he takes them without food.  Stools x 5 over 3 hours last PM, these were soft and formed, none since.  Dr Debby Bud d/cd the pre med PO phenergen ordered yesterday.    OBJECTIVE:         Vital signs in last 24 hours:    Temp:  [97.8 F (36.6 C)-98.6 F (37 C)] 97.8 F (36.6 C) (08/21 0600) Pulse Rate:  [76-86] 76  (08/21 0600) Resp:  [18-20] 20  (08/21 0600) BP: (121-158)/(80-108) 158/108 mmHg (08/21 0600) SpO2:  [96 %-97 %] 97 % (08/21 0600) Weight:  [226 lb 3.2 oz (102.604 kg)] 226 lb 3.2 oz (102.604 kg) (08/21 0500) Last BM Date: 12/12/11 General: looks better, laying in bed   Heart: RRR Chest: clear B Abdomen: soft, tender (less so) on left.  Active BS  Extremities: no pedal edema Neuro/Psych:  Pleasant, flat affect   Lab Results: No results found for this basename: WBC:3,HGB:3,HCT:3,PLT:3 in the last 72 hours BMET  Basename 12/12/11 0615 12/11/11 0552  NA 137 138  K 4.3 4.2  CL 104 103  CO2 25 24  GLUCOSE 110* 109*  BUN 11 10  CREATININE 1.11 1.10  CALCIUM 9.1 9.3    ASSESMENT:  * Enteritis, presumed food borne etiology, though organism/toxin not identified on multiple stool studies. Into the 3rd week since onset of sxs. Failed therapy with IV/PO Cipro and Flagyl. Started Xifaxan, Lomotil, Bentyl, PRN Ativan, 8/16. Ongoing Florastor.  Diarrhea better, but is getting BID Lomotil. Still requiring significant doses of IV Morphine and MSIR, which could be driving some of the nausea.    PLAN: *  Stopping Lomotil *  Advised pt to try to use Tylenol for pain if possible, to attempt to lessen the nausea. Morphine compounds may be contributing to the nausea. I am reducing dose of the IV Morphine.  Additionally has PO Morphine IR to use prn.   *  10 days of Xifaxan.  *  I ordered IV dose of  Phenergen before meals and stopped  Ativan.  At this point pt may be getting overmedicated, so trying to simplify meds *  Needs to stay in chair, avoid reclining in bed.    LOS: 8 days   Jennye Moccasin  12/13/2011, 9:37 AM Pager: [phone]

## 2011-12-14 ENCOUNTER — Encounter (HOSPITAL_COMMUNITY): Payer: Self-pay | Admitting: Radiology

## 2011-12-14 ENCOUNTER — Inpatient Hospital Stay (HOSPITAL_COMMUNITY): Payer: Commercial Managed Care - PPO

## 2011-12-14 MED ORDER — CALCIUM CARBONATE ANTACID 500 MG PO CHEW
2.0000 | CHEWABLE_TABLET | Freq: Four times a day (QID) | ORAL | Status: DC | PRN
  Administered 2011-12-14 – 2011-12-15 (×2): 400 mg via ORAL
  Filled 2011-12-14 (×2): qty 2

## 2011-12-14 MED ORDER — PANTOPRAZOLE SODIUM 40 MG PO TBEC
40.0000 mg | DELAYED_RELEASE_TABLET | Freq: Two times a day (BID) | ORAL | Status: DC
  Administered 2011-12-14 – 2011-12-16 (×4): 40 mg via ORAL
  Filled 2011-12-14 (×4): qty 1

## 2011-12-14 MED ORDER — IOHEXOL 300 MG/ML  SOLN
75.0000 mL | Freq: Once | INTRAMUSCULAR | Status: AC | PRN
  Administered 2011-12-14: 75 mL via INTRAVENOUS

## 2011-12-14 NOTE — Progress Notes (Signed)
I have taken an interval history, reviewed the chart and examined the patient. I agree with the extender's note, impression and recommendations.   Jamason Peckham T. Hana Trippett MD FACG 

## 2011-12-14 NOTE — Progress Notes (Signed)
     [name] Daily Rounding Note 12/14/2011, 8:22 AM  SUBJECTIVE:       No BM yesterday.  Abd pain much better.  Still with nausea.  No emesis.  Does tolerated solid POs.  OBJECTIVE:         Vital signs in last 24 hours:    Temp:  [98.1 F (36.7 C)-98.3 F (36.8 C)] 98.2 F (36.8 C) (08/22 0619) Pulse Rate:  [72-90] 75  (08/22 0619) Resp:  [18-20] 20  (08/22 0619) BP: (114-142)/(75-95) 142/95 mmHg (08/22 0619) SpO2:  [97 %-98 %] 97 % (08/22 0619) Weight:  [225 lb 15.6 oz (102.502 kg)] 225 lb 15.6 oz (102.502 kg) (08/22 0619) Last BM Date: 12/13/11 General: looks better, still not well appearing   Heart: rrr  Chest: clear B Abdomen: soft, BS hypoactive, slight tender on left.   Extremities: no pedal edema Neuro/Psych:  Pleasant, affect somewhat blunted   Lab  [facility] 12/12/11 0615  NA 137  K 4.3  CL 104  CO2 25  GLUCOSE 110*  BUN 11  CREATININE 1.11  CALCIUM 9.1    ASSESMENT: *  Enteritis, presumed food borne etiology, though organism/toxin not identified on multiple stool studies. Into the 3rd week since onset of sxs. Failed therapy with IV/PO Cipro and Flagyl. Started Xifaxan, Lomotil, Bentyl, PRN Ativan, 8/16.  Nausea is persistent. Note Dr Alvera Novel strategy for managing.  Diarrhea resolved > 24 hours, no set backs off Lomotil. Abd pain improved.     PLAN: *  Head CT to rule out CNS lesion as cause of nausea.  Protonix increased to BID.  Both these initiated by Dr Debby Bud.  Need to keep in mind that Protonix can have s/e of nausea.    LOS: 9 days   Jennye Moccasin  12/14/2011, 8:22 AM Pager: [phone]

## 2011-12-14 NOTE — Progress Notes (Signed)
Subjective: Crossed msg's with GI: patient was restarted on scheduled IV phenergan after effort to convert to oral. He has had IV scheduled and prn. Ativan is off. MS was to be oral but IV order rewritten. He has required fewer doses of MS.  Still having nausea - asked about ativan. No diarrhea. Cramping is better. He has been able to take increased PO's  Objective: Lab: Lab Results  Component Value Date   WBC 9.8 12/07/2011   HGB 13.1 12/07/2011   HCT 37.8* 12/07/2011   MCV 87.5 12/07/2011   PLT 97* 12/07/2011   BMET    Component Value Date/Time   NA 137 12/12/2011 0615   K 4.3 12/12/2011 0615   CL 104 12/12/2011 0615   CO2 25 12/12/2011 0615   GLUCOSE 110* 12/12/2011 0615   BUN 11 12/12/2011 0615   CREATININE 1.11 12/12/2011 0615   CALCIUM 9.1 12/12/2011 0615   GFRNONAA 77* 12/12/2011 0615   GFRAA 89* 12/12/2011 0615     Imaging:no new imaging  Scheduled Meds:   . diltiazem  360 mg Oral Daily  . enoxaparin (LOVENOX) injection  40 mg Subcutaneous Q24H  . pantoprazole  40 mg Oral Q0600  . promethazine  12.5 mg Intravenous TID  . rifaximin  550 mg Oral TID  . DISCONTD: diphenoxylate-atropine  1 tablet Oral BID  . DISCONTD: promethazine  25 mg Oral TID   Continuous Infusions:   . dextrose 5 % and 0.45% NaCl 1,000 mL with potassium chloride 20 mEq infusion 75 mL/hr at 12/14/11 0623   PRN Meds:.acetaminophen, acetaminophen, labetalol, morphine, morphine injection, promethazine, promethazine, DISCONTD: LORazepam, DISCONTD: LORazepam, DISCONTD:  morphine injection   Physical Exam: Filed Vitals:   12/14/11 0619  BP: 142/95  Pulse: 75  Temp: 98.2 F (36.8 C)  Resp: 20  No distress Cor- RRR PUlm- normal Abd - hypoactive BS, no guarding or rebound      Assessment/Plan: 1. GI - Day #17. Improved. Working on transitioning to PO meds in preparation for eventual discharge. Pharmacy consult - per patient did not address the question of timing of all meds and relationship to  food.  Plan -  he is to request phenergan PO 15-30 minutes before meals. IV phenergan as a last resort. He is to request PO MS for pain, using IV as a last resort. He will continue protonix bid, xifaxin, hyoscamine (replaced Bentyl).  2. ID - no active infection  3. Renal - normal renal function. Potassium is repleted  4. BP OK  With some variability  5. Ophthal - eye pain resolved.    Casimiro Needle Jorge Retz [facility] Call-grp - [facility] ([phone]; [phone]  12/14/2011, 7:08 AM

## 2011-12-15 DIAGNOSIS — N179 Acute kidney failure, unspecified: Secondary | ICD-10-CM

## 2011-12-15 DIAGNOSIS — I10 Essential (primary) hypertension: Secondary | ICD-10-CM

## 2011-12-15 LAB — BASIC METABOLIC PANEL
BUN: 17 mg/dL (ref 6–23)
CO2: 21 mEq/L (ref 19–32)
Calcium: 9.6 mg/dL (ref 8.4–10.5)
Chloride: 106 mEq/L (ref 96–112)
Creatinine, Ser: 1.05 mg/dL (ref 0.50–1.35)
GFR calc Af Amer: >90 mL/min (ref >90)
GFR calc non Af Amer: 82 mL/min — ABNORMAL LOW (ref >90)
Glucose, Bld: 125 mg/dL — ABNORMAL HIGH (ref 70–99)
Potassium: 4.1 mEq/L (ref 3.5–5.1)
Sodium: 140 mEq/L (ref 135–145)

## 2011-12-15 MED ORDER — DIPHENOXYLATE-ATROPINE 2.5-0.025 MG PO TABS
1.0000 | ORAL_TABLET | Freq: Two times a day (BID) | ORAL | Status: DC
  Administered 2011-12-15 – 2011-12-16 (×3): 1 via ORAL
  Filled 2011-12-15 (×3): qty 1

## 2011-12-15 NOTE — Progress Notes (Signed)
     [facility] Daily Rounding Note 12/15/2011, 10:07 AM  SUBJECTIVE:       Had "7" soft/formed as well as loose stools yesterday.  Loose stool this AM Nausea better, still lingers, but not vomiting and keeping down solids. Not using Phenergen as often.  Abd pain now is cramps assoc with passing stools. Used MSIR twice yesterday, once today.  Says he feels "90%" better.  Feels like he could go home.   OBJECTIVE:         Vital signs in last 24 hours:    Temp:  [98.5 F (36.9 C)-98.8 F (37.1 C)] 98.6 F (37 C) (08/23 0456) Pulse Rate:  [74-82] 74  (08/23 0456) Resp:  [18-20] 20  (08/23 0456) BP: (109-128)/(76-85) 116/76 mmHg (08/23 0456) SpO2:  [95 %-98 %] 95 % (08/23 0456) Weight:  [225 lb 1.4 oz (102.1 kg)] 225 lb 1.4 oz (102.1 kg) (08/23 0456) Last BM Date: 12/14/11 General: Looks better but affect blunted.  Heart: RRR Chest: clear B.  Unlabored breathing Abdomen: soft, mild tenderness of mid and left abdomen  Extremities: no pedal edema Neuro/Psych:  No tremor.  No gross deficits.  Affect flat, seems overall depressed.     Studies/Results: Ct Head W Wo Contrast 12/14/2011  *RADIOLOGY REPORT*  Clinical Data:  Nausea  CT HEAD WITHOUT AND WITH CONTRAST  Technique:  Contiguous axial images were obtained from the base of the skull through the vertex without and with intravenous contrast  Contrast: 75mL OMNIPAQUE IOHEXOL 300 MG/ML  SOLN  Comparison:   None.  Findings:  The brain has a normal appearance without evidence for hemorrhage, acute infarction, hydrocephalus or mass lesion.  There is no extra axial fluid collection.  The skull and paranasal sinuses are normal.  Following contrast infusion there is a normal enhancement pattern and no mass lesion is identified.  IMPRESSION: Normal CT of the head without and with contrast.   Original Report Authenticated By: Camelia Phenes, M.D.     ASSESMENT: * Enteritis, presumed food borne etiology, though organism/toxin not identified on  multiple stool studies. Into the 3rd week since onset of sxs. Failed therapy with IV/PO Cipro and Flagyl. Started Xifaxan 8/16.  Head CT normal, ruling out CNS lesion as cause of persistent Nausea.  Overall nausea, diarrhea improved, not resolved.  Persistent left abd pain resolved, some lingering cramps associated with BMs. He is consistently tolerating and keeping down POs  PLAN: *  Restart bid Lomotil, titrate dose up or down depending on stool frequency. *  Ok to discharge from GI standpoint.  Complete 2 weeks Xifaxan on 8/30.  Insurance may not approve TID doses, so may be best to prescribe BID on paper, instruct pt to dose TID, providing sufficient quantity to complete the remainder of 2 weeks duration.  *  ROV with Dr Debby Bud, if ongoing sxs or GI concerns, Dr Debby Bud can arrange GI ROV.   LOS: 10 days   Jennye Moccasin  12/15/2011, 10:07 AM Pager: [phone]

## 2011-12-15 NOTE — Progress Notes (Signed)
Subjective: Getting ready to shower. Has had a good 24 hrs. He has had an appetite, ate w/o N/V, has had 2 loose stools, no abdominal cramping.  Objective: Lab: Lab Results  Component Value Date   WBC 9.8 12/07/2011   HGB 13.1 12/07/2011   HCT 37.8* 12/07/2011   MCV 87.5 12/07/2011   PLT 97* 12/07/2011   BMET    Component Value Date/Time   NA 137 12/12/2011 0615   K 4.3 12/12/2011 0615   CL 104 12/12/2011 0615   CO2 25 12/12/2011 0615   GLUCOSE 110* 12/12/2011 0615   BUN 11 12/12/2011 0615   CREATININE 1.11 12/12/2011 0615   CALCIUM 9.1 12/12/2011 0615   GFRNONAA 77* 12/12/2011 0615   GFRAA 89* 12/12/2011 0615     Imaging: no new imaging  Scheduled Meds:   . diltiazem  360 mg Oral Daily  . diphenoxylate-atropine  1 tablet Oral BID  . enoxaparin (LOVENOX) injection  40 mg Subcutaneous Q24H  . pantoprazole  40 mg Oral BID AC  . rifaximin  550 mg Oral TID   Continuous Infusions:   . dextrose 5 % and 0.45% NaCl 1,000 mL with potassium chloride 20 mEq infusion 75 mL/hr at 12/15/11 0132   PRN Meds:.acetaminophen, acetaminophen, calcium carbonate, iohexol, labetalol, morphine, morphine injection, promethazine, promethazine   Physical Exam: Filed Vitals:   12/15/11 0456  BP: 116/76  Pulse: 74  Temp: 98.6 F (37 C)  Resp: 20  up to BR Cor- RRR Pulm - normal respirations Neuro - A&O x 3, normal cognition, normal gait and balance     Assessment/Plan: 1. GI - Day #18 much improved. 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  2. ID - reviewed micro - all negative  3. Renal - last  Lab 8/20 Plan- Bmet this PM  4. BP controlled on his present medications (home regimen)  If he has a good day - home in AM   [facility] Call-grp - [facility] (o) [phone]; ([phone]  12/15/2011, 1:38 PM

## 2011-12-15 NOTE — Progress Notes (Signed)
I have taken an interval history, reviewed the chart and examined the patient. I agree with the extender's note, impression and recommendations. Tolerate lunch very well. Almost all meds are PO now. Xifaxan through 8/30, continue phenergan prn and lomotil prn. He is close to discharge, maybe tomorrow if he continues to do well. We will sign off.  Venita Lick. Russella Dar MD Clementeen Graham

## 2011-12-16 MED ORDER — DIPHENOXYLATE-ATROPINE 2.5-0.025 MG PO TABS: 1.0000 | ORAL_TABLET | Freq: Two times a day (BID) | ORAL | Status: DC

## 2011-12-16 MED ORDER — PROMETHAZINE HCL 12.5 MG PO TABS: 12.5000 mg | ORAL_TABLET | ORAL | Status: DC | PRN

## 2011-12-16 MED ORDER — LANSOPRAZOLE 30 MG PO CPDR: 30.0000 mg | DELAYED_RELEASE_CAPSULE | Freq: Every day | ORAL | Status: DC

## 2011-12-16 NOTE — Progress Notes (Signed)
Patient and wife were given discharge instructions. Skin intact. Taken down by wheelchair no problems noted.

## 2011-12-16 NOTE — Discharge Summary (Signed)
[name], CONGROVE NO.:  [id number]  MEDICAL RECORD NO.:  [id number]  LOCATION:  5525                         FACILITY:  [facility]  PHYSICIAN:  Rosalyn Gess. [place] Scheiderer, MD  DATE OF BIRTH:  05-Jul-1960  DATE OF ADMISSION:  12/05/2011 DATE OF DISCHARGE:  12/15/2011                              DISCHARGE SUMMARY   ADMITTING DIAGNOSES: 1. Refractory nausea and vomiting. 2. Hypertension.  DISCHARGE DIAGNOSES: 1. Refractory nausea and vomiting. 2. Hypertension.  CONSULTANTS:  Dr. Judie Petit T. Russella Dar, an associate for the GI Service.  PROCEDURES: 1. Acute abdominal series, August 13th with mild cardiomegaly.  No     active lung disease.  Paucity of bowel gas.  Cannot exclude small     bowel obstruction with fluid-filled bowel.  Left renal calculus. 2. CT scan of abdomen and pelvis, August 13th,  suspect mild     circumferential wall thickening of multiple loops of mid and distal     small bowel without dilatation or significant adjacent inflammatory     or edematous changes suggesting enteritis.  Left nephrolithiasis     without hydronephrosis. 3. CT angio August 15th, negative for evidence of ischemia.  Mild     scattered atherosclerotic vascular disease, descending abdominal     aorta.  No aneurysm noted.  Improved appearance of the small bowel     with mild thickening of a few scattered loops.  Nonobstructing     stone in the upper pole of left kidney.  Small fat-containing     umbilical hernia. 4. CT of the head with and without contrast which was read as a normal     CT, with and without contrast with no abnormalities noted.  HISTORY OF PRESENT ILLNESS:  [name] is a 51 year old gentleman who was recently admitted and then discharged on August 12th.  He had gone to a food tasting fair two days prior to admission.  He had eaten some sushi and tuna and some crab dip.  Within 6-8 hours he was having significant abdominal cramping, nausea and vomiting, followed by  the development of copious diarrhea.  He presented to the emergency department on August 8th because of profound dehydration, acute renal insufficiency, abdominal cramping, nausea, vomiting and diarrhea.  He was admitted to the hospital and was diagnosed with staphylococcal food poisoning.  The patient was given supportive care.  He improved to the point where he was thought stable for discharge on the 12th.  The patient did return home but immediately he had a recurrence of nausea, vomiting, diarrhea, inability to keep down food or fluids, and re- presented on August 13th to the hospital where he was subsequently admitted.  Please see the H and P for past medical history, family history, social history, and admitting physical exam.  Please see the previous discharge summary from August 12th.  HOSPITAL COURSE: 1. GI.  The patient with refractory nausea and vomiting and diarrhea.     His evaluation included CT abdomen and pelvis, which was     significant for probable enteritis as noted above.  CT angio was     done to rule out ischemic bowel which was negative.  The patient  was treated supportively with Zofran and Phenergan which was more     effective,  Ativan for nausea as well.  He was given IV fluids and     monitored closely.  The patient did have a significant amount of     weight loss secondary to this illness.  He was seen in consultation     by the GI Service to help guide the supportive care.  Slowly over a     period of 10 days the patient's symptoms did abate.  He was able to     take a full diet without difficulty for the last 24 hours prior to     discharge.  He still had 1-2 loose stools but no diarrhea.  He did     not have any significant problems with nausea or  emesis when     eating.  The patient did have CT of the brain to make sure there was no central nervous system cause for his nausea and vomiting, which was a negative study.  GI diagnosis at the time  of discharge was post infectious bowel motility disorder/IBS.  This was slow to recover, but he has done well.  At this point, he is being able to take food and fluids with no nausea or vomiting, with no significant diarrhea.  He is stable and ready for discharge.  The patient is debilitated from this illness and is not to return to work until he is seen for followup in 5 days.  1. Hypertension.  Patient's blood pressure had been elevated.  During     that time, he is unable to take his medications.  He was restarted     on his home medications and blood pressure stabilized nicely. 2. Renal insufficiency.  The patient had acute renal insufficiency     with his initial admission.  He was kept hydrated and his renal     insufficiency resolved with creatinine returning to normal.  DISCHARGE EXAMINATION: VITAL SIGNS:  Temperature was 98.1, blood pressure 138/86, pulse 75, respirations 18, oxygen saturation is 94% on room air. GENERAL APPEARANCE:  The patient was markedly improved in appearance and bright affect.  HEENT:  Conjunctivae and sclerae were clear.  Pupils equal, round, and reactive. CHEST:  Patient is moving air well with no rales,  wheezes or rhonchi. CARDIOVASCULAR:  2+ radial pulses.  Precordium is quiet.  He had a regular rate and rhythm. ABDOMEN:  Soft.  No guarding or rebound.  He had positive bowel sounds. NEUROLOGIC:  Patient is awake, alert.  He is oriented to person, place, time and context.  Speech is clear.  Normal movement.  FINAL LABORATORY DATA:  Imaging studies as above.  Final metabolic panel from August 23rd with a sodium of 140, potassium 4.1, chloride 106, CO2 of 21, BUN 17, creatinine 1.05.  Glucose was 125.  Hematology with final CBC from August 15th with a white count of 9800, hemoglobin of 13.1 g, platelet count was 97,000.  Differential on the CBC from August 14th with a normal differential.  The patient had a urinalysis at time of his re-admission  which was negative.  Stool cultures were obtained during these admissions and  he was negative for C. diff, he was negative for fecal lactoferrin, he was negative for Giardia and Cryptosporidium.  No ova or parasites were found.  DISPOSITION:  The patient is to be discharged home.  He will continue on his home medications.  He will be provided  with a prescription for Phenergan to take as needed.  He will continue on Protonix 40 mg b.i.d. until seen in followup.  Patient's condition at time of discharge dictation is medically stable and improved.     Rosalyn Gess September Mormile, MD     MEN/MEDQ  D:  12/16/2011  T:  12/16/2011  Job:  [id number]

## 2011-12-16 NOTE — Progress Notes (Signed)
Subjective: Feeling much better. Has been able to eat w/o nausea or vomiting. Has had 2 loose stools but no diarrhea per se.  Objective: Lab: Lab Results  Component Value Date   WBC 9.8 12/07/2011   HGB 13.1 12/07/2011   HCT 37.8* 12/07/2011   MCV 87.5 12/07/2011   PLT 97* 12/07/2011   BMET    Component Value Date/Time   NA 140 12/15/2011 1902   K 4.1 12/15/2011 1902   CL 106 12/15/2011 1902   CO2 21 12/15/2011 1902   GLUCOSE 125* 12/15/2011 1902   BUN 17 12/15/2011 1902   CREATININE 1.05 12/15/2011 1902   CALCIUM 9.6 12/15/2011 1902   GFRNONAA 82* 12/15/2011 1902   GFRAA >90 12/15/2011 1902     Imaging:  Scheduled Meds:   . diltiazem  360 mg Oral Daily  . diphenoxylate-atropine  1 tablet Oral BID  . enoxaparin (LOVENOX) injection  40 mg Subcutaneous Q24H  . pantoprazole  40 mg Oral BID AC  . rifaximin  550 mg Oral TID   Continuous Infusions:   . dextrose 5 % and 0.45% NaCl 1,000 mL with potassium chloride 20 mEq infusion 75 mL/hr at 12/16/11 0605   PRN Meds:.acetaminophen, acetaminophen, calcium carbonate, labetalol, morphine, morphine injection, promethazine, promethazine   Physical Exam: Filed Vitals:   12/16/11 0520  BP: 138/86  Pulse: 75  Temp: 98.1 F (36.7 C)  Resp: 18        Assessment/Plan: For d/c home Dictated #[id number]   Illene Regulus  IM (o) [phone]; (c) [phone] Call-grp - Patsi Sears IM Tele: [phone]  12/16/2011, 10:10 AM

## 2011-12-19 ENCOUNTER — Other Ambulatory Visit: Payer: Self-pay | Admitting: *Deleted

## 2011-12-19 MED ORDER — DIPHENOXYLATE-ATROPINE 2.5-0.025 MG PO TABS: 1.0000 | ORAL_TABLET | Freq: Two times a day (BID) | ORAL | Status: AC

## 2011-12-19 NOTE — Telephone Encounter (Signed)
Patient called request refill on Lomotil. Rx printed and to sign by Dr. Debby Bud.

## 2011-12-20 ENCOUNTER — Other Ambulatory Visit: Payer: Self-pay | Admitting: *Deleted

## 2011-12-20 ENCOUNTER — Ambulatory Visit (INDEPENDENT_AMBULATORY_CARE_PROVIDER_SITE_OTHER): Payer: Commercial Managed Care - PPO | Admitting: Internal Medicine

## 2011-12-20 ENCOUNTER — Encounter: Payer: Self-pay | Admitting: Internal Medicine

## 2011-12-20 VITALS — BP 142/90 | HR 111 | Temp 97.2°F | Resp 16 | Wt 228.0 lb

## 2011-12-20 DIAGNOSIS — K5289 Other specified noninfective gastroenteritis and colitis: Secondary | ICD-10-CM

## 2011-12-20 MED ORDER — LEVOTHYROXINE SODIUM 25 MCG PO TABS: 25.0000 ug | ORAL_TABLET | Freq: Every day | ORAL | Status: DC

## 2011-12-20 NOTE — Patient Instructions (Addendum)
GI - a case of food poisoning - most likely staphylococcus. This resolved and was followed by severely disordered bowel motility = post infectious IBS. A slow recovery but you should continue to improve. It will take a little time to regain your strength. OK to use the phenergan and lomotil as needed.  Heart rate today is up a little - = 111. Your baseline is about 78-88. Also Bp 142/96. We will need to watch this and make adjustments in your medications.  Your kidney function at the time of hospital discharge was normal.  You can return to work on Monday, September 2nd.

## 2011-12-20 NOTE — Progress Notes (Signed)
Subjective:    Patient ID: [name], male    DOB: 08/27/1960, 51 y.o.   MRN: [id number]  HPI [name] had an 18 day hospitalization for food poisoning with subsequent disordered bowel motility with refractory N/V, diarrhea, and acute renal failure secondary to dehydration.. He had CT abd/pelvis, multiple labs, and GI consultation. He was given a full course of antibiotic treatment. He required frequent doses of antiemetics and anti-diarrheals. He lost about 23 lbs over the course of the illness. He finally improved to where he could take a bland diet and keep down fluids. Since discharge he has had occasional nausea, occasional diarrhea that has responded to phenergan and lomotil. He is slowly rebuilding his diet.  Past Medical History  Diagnosis Date  . Hypertension   . Unspecified intestinal obstruction   . Unspecified asthma   . Chest pain, atypical   . Pulmonary nodule   . Nontoxic multinodular goiter   . Painful respiration   . Dyspnea on exertion   . Trigeminal neuralgia   . Cluster headache   . Hyperlipidemia   . GERD (gastroesophageal reflux disease)   . Complication of anesthesia     difficult waking up"  . Food poisoning 11/2011   Past Surgical History  Procedure Date  . Appendectomy   . Knee arthroscopy   . 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 Right shoulder redo-reconstruction 06/21/10  . Destruction trigeminal nerve via neurolytic agent    Family History  Problem Relation Age of Onset  . Diabetes Mother   . Hyperlipidemia Mother   . Hypertension Mother   . Hypertension Father   . Heart disease Father     CAD/CHF  . Cancer Father     Mesothelioma  . Heart disease Maternal Aunt   . Prostate cancer Neg Hx   . Colon cancer Neg Hx    History   Social History  . Marital Status: Married    Spouse Name: N/A    Number of Children: N/A  . 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 Smoking status: Current Everyday Smoker -- 0.2 packs/day for 18 years    Types: Cigarettes  . Smokeless tobacco: Never Used   Comment: 1-2 cigs per day, started age 53  . Alcohol Use: Yes     rare  . Drug Use: No  . Sexually Active: Not on file   Other Topics Concern  . 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 amitriptyline (ELAVIL) 25 MG tablet Take 50 mg by mouth daily.      Marland Kitchen aspirin 81 MG EC tablet Take 81 mg by mouth daily.        . cetirizine-pseudoephedrine (ZYRTEC-D) 5-120 MG per tablet Take 1 tablet by mouth 2 (two) times daily.        Marland Kitchen diltiazem (TIAZAC) 240 MG 24 hr capsule Take 1 capsule (240 mg total) by mouth daily.  30 capsule  11  . diphenoxylate-atropine (LOMOTIL) 2.5-0.025 MG per tablet Take 1 tablet by mouth 2 (two) times daily.  30 tablet  0  . lansoprazole (PREVACID) 30 MG capsule Take 1 capsule (30 mg total)  by mouth daily. Take twice a day until office follow up Wednesday August 28  30 capsule  5  . promethazine (PHENERGAN) 12.5 MG tablet Take 1 tablet (12.5 mg total) by mouth every 4 (four) hours as needed.  30 tablet  1  . VYTORIN 10-80 MG per tablet TAKE ONE TABLET BY MOUTH DAILY  30 tablet  11  . promethazine (PHENERGAN) 12.5 MG tablet Take 1 tablet (12.5 mg total) by mouth every 4 (four) hours. As needed for headache  30 tablet  0  . sertraline (ZOLOFT) 50 MG tablet Take 1 tablet (50 mg total) by mouth daily.  30 tablet  6  . DISCONTD: levothyroxine (SYNTHROID, LEVOTHROID) 25 MCG tablet TAKE 1 TABLET BY MOUTH DAILY  30 tablet  2     Review of Systems System review is negative for any constitutional, cardiac, pulmonary, GI or neuro symptoms or complaints other than as described in the HPI.     Objective:    Physical Exam Filed Vitals:   12/20/11 1442  BP: 142/90  Pulse: 111  Temp: 97.2 F (36.2 C)  Resp: 16   Gen'l- WNWD AA man in no distress HEENT - C&S clear Cor- Regular tachycardia Pulm - normal Abd- soft, hypoactive bowel sounds.       Assessment & Plan:

## 2011-12-23 NOTE — Assessment & Plan Note (Signed)
18 day illness August '13: started with probable staphylococcal food poisoning from raw tuna followed by post-infectious colitis/IBS. He lost a lot of weight. Since discharge he has been doing pretty well: has been able to tolerate a diet w/o vomiting although he still has a little nausea; no diarrhea; strength is returning.  Plan - several more days of convalescence   Advance diet  Return to work Monday, September 2nd.

## 2011-12-25 ENCOUNTER — Encounter (HOSPITAL_BASED_OUTPATIENT_CLINIC_OR_DEPARTMENT_OTHER): Payer: Self-pay | Admitting: *Deleted

## 2011-12-25 ENCOUNTER — Emergency Department (HOSPITAL_BASED_OUTPATIENT_CLINIC_OR_DEPARTMENT_OTHER)
Admission: EM | Admit: 2011-12-25 | Discharge: 2011-12-25 | Disposition: A | Discharge status: Home / Self Care | Payer: Commercial Managed Care - PPO | Attending: Emergency Medicine | Admitting: Emergency Medicine

## 2011-12-25 DIAGNOSIS — Z7982 Long term (current) use of aspirin: Secondary | ICD-10-CM | POA: Insufficient documentation

## 2011-12-25 DIAGNOSIS — K529 Noninfective gastroenteritis and colitis, unspecified: Secondary | ICD-10-CM

## 2011-12-25 DIAGNOSIS — K219 Gastro-esophageal reflux disease without esophagitis: Secondary | ICD-10-CM | POA: Insufficient documentation

## 2011-12-25 DIAGNOSIS — Z79899 Other long term (current) drug therapy: Secondary | ICD-10-CM | POA: Insufficient documentation

## 2011-12-25 DIAGNOSIS — I10 Essential (primary) hypertension: Secondary | ICD-10-CM | POA: Insufficient documentation

## 2011-12-25 DIAGNOSIS — K5289 Other specified noninfective gastroenteritis and colitis: Secondary | ICD-10-CM | POA: Insufficient documentation

## 2011-12-25 LAB — CBC WITH DIFFERENTIAL/PLATELET
Basophils Absolute: 0 10*3/uL (ref 0.0–0.1)
Basophils Relative: 0 % (ref 0–1)
Eosinophils Absolute: 0.1 10*3/uL (ref 0.0–0.7)
Eosinophils Relative: 1 % (ref 0–5)
HCT: 49.4 % (ref 39.0–52.0)
Hemoglobin: 17.6 g/dL — ABNORMAL HIGH (ref 13.0–17.0)
Lymphocytes Relative: 26 % (ref 12–46)
Lymphs Abs: 3.7 10*3/uL (ref 0.7–4.0)
MCH: 29.9 pg (ref 26.0–34.0)
MCHC: 35.6 g/dL (ref 30.0–36.0)
MCV: 84 fL (ref 78.0–100.0)
Monocytes Absolute: 1.1 10*3/uL — ABNORMAL HIGH (ref 0.1–1.0)
Monocytes Relative: 8 % (ref 3–12)
Neutro Abs: 9.2 10*3/uL — ABNORMAL HIGH (ref 1.7–7.7)
Neutrophils Relative %: 65 % (ref 43–77)
Platelets: 263 10*3/uL (ref 150–400)
RBC: 5.88 MIL/uL — ABNORMAL HIGH (ref 4.22–5.81)
RDW: 13 % (ref 11.5–15.5)
WBC: 14.1 10*3/uL — ABNORMAL HIGH (ref 4.0–10.5)

## 2011-12-25 LAB — URINALYSIS, ROUTINE W REFLEX MICROSCOPIC
Bilirubin Urine: NEGATIVE
Glucose, UA: NEGATIVE mg/dL
Hgb urine dipstick: NEGATIVE
Ketones, ur: NEGATIVE mg/dL
Leukocytes, UA: NEGATIVE
Nitrite: NEGATIVE
Protein, ur: NEGATIVE mg/dL
Specific Gravity, Urine: 1.023 (ref 1.005–1.030)
Urobilinogen, UA: 0.2 mg/dL (ref 0.0–1.0)
pH: 5.5 (ref 5.0–8.0)

## 2011-12-25 LAB — BASIC METABOLIC PANEL
BUN: 10 mg/dL (ref 6–23)
CO2: 19 mEq/L (ref 19–32)
Calcium: 10.8 mg/dL — ABNORMAL HIGH (ref 8.4–10.5)
Chloride: 103 mEq/L (ref 96–112)
Creatinine, Ser: 1.1 mg/dL (ref 0.50–1.35)
GFR calc Af Amer: >90 mL/min (ref >90)
GFR calc non Af Amer: 78 mL/min — ABNORMAL LOW (ref >90)
Glucose, Bld: 115 mg/dL — ABNORMAL HIGH (ref 70–99)
Potassium: 4.2 mEq/L (ref 3.5–5.1)
Sodium: 140 mEq/L (ref 135–145)

## 2011-12-25 MED ORDER — DICYCLOMINE HCL 20 MG PO TABS: 20.0000 mg | ORAL_TABLET | Freq: Two times a day (BID) | ORAL | Status: DC

## 2011-12-25 MED ORDER — MORPHINE SULFATE 4 MG/ML IJ SOLN
INTRAMUSCULAR | Status: AC
  Administered 2011-12-25: 4 mg via INTRAVENOUS
  Filled 2011-12-25: qty 1

## 2011-12-25 MED ORDER — ONDANSETRON HCL 4 MG/2ML IJ SOLN
4.0000 mg | Freq: Once | INTRAMUSCULAR | Status: AC
  Administered 2011-12-25: 4 mg via INTRAVENOUS
  Filled 2011-12-25: qty 2

## 2011-12-25 MED ORDER — LORAZEPAM 2 MG/ML IJ SOLN
1.0000 mg | Freq: Once | INTRAMUSCULAR | Status: AC
  Administered 2011-12-25: 2 mg via INTRAVENOUS
  Filled 2011-12-25: qty 1

## 2011-12-25 MED ORDER — SODIUM CHLORIDE 0.9 % IV BOLUS (SEPSIS)
1000.0000 mL | Freq: Once | INTRAVENOUS | Status: AC
  Administered 2011-12-25: 1000 mL via INTRAVENOUS

## 2011-12-25 MED ORDER — MORPHINE SULFATE 2 MG/ML IJ SOLN
INTRAMUSCULAR | Status: AC
  Administered 2011-12-25: 2 mg via INTRAVASCULAR
  Filled 2011-12-25: qty 1

## 2011-12-25 MED ORDER — MORPHINE SULFATE 4 MG/ML IJ SOLN
4.0000 mg | Freq: Once | INTRAMUSCULAR | Status: AC
  Administered 2011-12-25: 4 mg via INTRAVENOUS
  Filled 2011-12-25: qty 1

## 2011-12-25 MED ORDER — LORAZEPAM 2 MG/ML IJ SOLN
1.0000 mg | Freq: Once | INTRAMUSCULAR | Status: AC
  Administered 2011-12-25: 1 mg via INTRAVENOUS
  Filled 2011-12-25: qty 1

## 2011-12-25 MED ORDER — AMITRIPTYLINE HCL 25 MG PO TABS
25.0000 mg | ORAL_TABLET | Freq: Every day | ORAL | Status: DC
  Filled 2011-12-25: qty 1

## 2011-12-25 MED ORDER — DICYCLOMINE HCL 10 MG PO CAPS
10.0000 mg | ORAL_CAPSULE | Freq: Once | ORAL | Status: AC
  Administered 2011-12-25: 10 mg via ORAL
  Filled 2011-12-25: qty 1

## 2011-12-25 MED ORDER — DIPHENOXYLATE-ATROPINE 2.5-0.025 MG PO TABS
2.0000 | ORAL_TABLET | Freq: Once | ORAL | Status: AC
  Administered 2011-12-25: 2 via ORAL
  Filled 2011-12-25: qty 2

## 2011-12-25 MED ORDER — PROMETHAZINE HCL 25 MG/ML IJ SOLN
INTRAMUSCULAR | Status: AC
  Administered 2011-12-25: 12.5 mg
  Filled 2011-12-25: qty 1

## 2011-12-25 MED ORDER — LORAZEPAM 1 MG PO TABS: ORAL_TABLET | ORAL | Status: DC

## 2011-12-25 NOTE — ED Notes (Signed)
Patient states he has had diarrhea at least 25-30 times in the last 24 hours.  Severe abdominal cramps and vomited x 1.  States he has had 2 recent hospital admissions for food poisoning after eating red tuna at a food show.

## 2011-12-25 NOTE — ED Notes (Signed)
The patient is unable to urinate at this time. He knows that a sample is needed. And stated that as soon as he is able go urinate he will get [place] a sample.

## 2011-12-26 ENCOUNTER — Ambulatory Visit (INDEPENDENT_AMBULATORY_CARE_PROVIDER_SITE_OTHER): Payer: Commercial Managed Care - PPO | Admitting: Endocrinology

## 2011-12-26 ENCOUNTER — Telehealth: Payer: Self-pay | Admitting: Internal Medicine

## 2011-12-26 ENCOUNTER — Encounter: Payer: Self-pay | Admitting: Endocrinology

## 2011-12-26 VITALS — BP 120/84 | HR 87 | Temp 97.5°F | Resp 16 | Wt 229.5 lb

## 2011-12-26 DIAGNOSIS — K56609 Unspecified intestinal obstruction, unspecified as to partial versus complete obstruction: Secondary | ICD-10-CM

## 2011-12-26 MED ORDER — OXYCODONE-ACETAMINOPHEN 7.5-325 MG PO TABS: 1.0000 | ORAL_TABLET | ORAL | Status: DC | PRN

## 2011-12-26 NOTE — Progress Notes (Signed)
Subjective:    Patient ID: [name], male    DOB: April 05, 1961, 51 y.o.   MRN: [id number]  HPI Pt was recently d/c'ed from the hospital after rx for gastroenteritis.  he now has 2 days of cramps at the LLQ, and assoc diarrhea.  He was seen at ER yesterday.  N/V has resolved over the past day, and cramps are less.   Past Medical History  Diagnosis Date  . Hypertension   . Unspecified intestinal obstruction   . Unspecified asthma   . Chest pain, atypical   . Nontoxic multinodular goiter   . Painful respiration   . Dyspnea on exertion   . Trigeminal neuralgia   . Cluster headache   . Hyperlipidemia   . GERD (gastroesophageal reflux disease)   . Complication of anesthesia     difficult waking up"  . Food poisoning 11/2011  . Multiple thyroid nodules     Past Surgical History  Procedure Date  . Appendectomy   . Knee arthroscopy   . 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 Right shoulder redo-reconstruction 06/21/10  . Destruction trigeminal nerve via neurolytic agent     History   Social History  . Marital Status: Married    Spouse Name: N/A    Number of Children: N/A  . 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 Smoking status: Current Everyday Smoker -- 0.2 packs/day for 18 years    Types: Cigarettes  . Smokeless tobacco: Never Used   Comment: 1-2 cigs per day, started age 22  . Alcohol Use: Yes     rare  . Drug Use: No  . Sexually Active: Not on file   Other Topics Concern  . 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 amitriptyline (ELAVIL) 25 MG tablet Take 50 mg by mouth daily.      Marland Kitchen aspirin 81 MG EC tablet Take 81 mg by mouth daily.        Marland Kitchen  bismuth subsalicylate (PEPTO BISMOL) 262 MG/15ML suspension Take 15 mLs by mouth every 6 (six) hours as needed. For upset stomach.      . dicyclomine (BENTYL) 20 MG tablet Take 1 tablet (20 mg total) by mouth 2 (two) times daily.  20 tablet  0  . diltiazem (TIAZAC) 240 MG 24 hr capsule Take 1 capsule (240 mg total) by mouth daily.  30 capsule  11  . diphenoxylate-atropine (LOMOTIL) 2.5-0.025 MG per tablet Take 1 tablet by mouth 2 (two) times daily.  30 tablet  0  . lansoprazole (PREVACID) 30 MG capsule Take 1 capsule (30 mg total) by mouth daily. Take twice a day until office follow up Wednesday August 28  30 capsule  5  . levothyroxine (SYNTHROID, LEVOTHROID) 25 MCG tablet Take 1 tablet (25 mcg total) by mouth daily.  30 tablet  1  . LORazepam (ATIVAN) 1 MG tablet One tablet up to 3 times daily for nausea  15 tablet  0  . promethazine (PHENERGAN) 12.5 MG tablet Take 12.5 mg by mouth every 6 (six) hours as needed.      Marland Kitchen VYTORIN 10-80 MG per tablet TAKE ONE TABLET  BY MOUTH DAILY  30 tablet  11  . promethazine (PHENERGAN) 12.5 MG tablet Take 1 tablet (12.5 mg total) by mouth every 4 (four) hours. As needed for headache  30 tablet  0  . promethazine (PHENERGAN) 12.5 MG tablet Take 1 tablet (12.5 mg total) by mouth every 4 (four) hours as needed.  30 tablet  1  . sertraline (ZOLOFT) 50 MG tablet Take 1 tablet (50 mg total) by mouth daily.  30 tablet  6    No Known Allergies  Family History  Problem Relation Age of Onset  . Diabetes Mother   . Hyperlipidemia Mother   . Hypertension Mother   . Hypertension Father   . Heart disease Father     CAD/CHF  . Cancer Father     Mesothelioma  . Heart disease Maternal Aunt   . Prostate cancer Neg Hx   . Colon cancer Neg Hx     BP 120/84  Pulse 87  Temp 97.5 F (36.4 C) (Oral)  Resp 16  Wt 229 lb 8 oz (104.101 kg)  SpO2 96%    Review of Systems Denies fever.    Objective:   Physical Exam VITAL SIGNS:  See vs page GENERAL: no  distress ABDOMEN: abdomen is soft, nontender.  no hepatosplenomegaly.  not distended.  no hernia.          Assessment & Plan:  GI sxs, uncertain etiology.  The overall trend is toward improvement

## 2011-12-26 NOTE — Telephone Encounter (Signed)
Patient calling, had to go to the ED on Monday night.  States that he was just released from the hospital on 8/25 after having food poisoning.  Sunday he ate ham and a german potatoe salad.  He had 3 bm's immediately eating this.  Had recurrent episodes and the same sx that he had when he was hospitalized.   He continues to have cramping and abdominal pain.  No vomiting or fever.  Last night he was told that he was dehydrated and had IVF's.   Scheduled at 4p today with Romero Belling.

## 2011-12-26 NOTE — Patient Instructions (Addendum)
hare is a prescription against the pain and cramps I hope you feel better soon.  If you don't feel better soon, please call dr Debby Bud.

## 2012-01-11 NOTE — ED Provider Notes (Signed)
History     CSN: [id number]  Arrival date & time 12/25/11  1128   First MD Initiated Contact with Patient 12/25/11 1304      Chief Complaint  Patient presents with  . Diarrhea    (Consider location/radiation/quality/duration/timing/severity/associated sxs/prior treatment) Patient is a 51 y.o. male presenting with diarrhea. The history is provided by the patient and the spouse.  Diarrhea The primary symptoms include abdominal pain, nausea and diarrhea. Primary symptoms do not include fever, dysuria, myalgias or rash.  Associated symptoms comments: Recent hospitalization for food poisoning, discharged one week ago. Had been doing well until the last one day when diarrhea recurred. He has nausea without continued vomiting (one episode of vomiting, greater than 25 episodes of diarrhea). No fever. He has cramping abdominal pain. No bloody bowel movement. .    Past Medical History  Diagnosis Date  . Hypertension   . Unspecified intestinal obstruction   . Unspecified asthma   . Chest pain, atypical   . Nontoxic multinodular goiter   . Painful respiration   . Dyspnea on exertion   . Trigeminal neuralgia   . Cluster headache   . Hyperlipidemia   . GERD (gastroesophageal reflux disease)   . Complication of anesthesia     difficult waking up"  . Food poisoning 11/2011  . Multiple thyroid nodules     Past Surgical History  Procedure Date  . Appendectomy   . Knee arthroscopy   . 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 Right shoulder redo-reconstruction 06/21/10  . Destruction trigeminal nerve via neurolytic agent     Family History  Problem Relation Age of Onset  . Diabetes Mother   . Hyperlipidemia Mother   . Hypertension Mother   . Hypertension Father   . Heart disease Father     CAD/CHF  . Cancer Father     Mesothelioma  . Heart disease Maternal Aunt   . Prostate cancer Neg Hx   . Colon cancer Neg Hx     History  Substance Use Topics    . Smoking status: Current Every Day Smoker -- 0.2 packs/day for 18 years    Types: Cigarettes  . Smokeless tobacco: Never Used   Comment: 1-2 cigs per day, started age 19  . Alcohol Use: Yes     rare      Review of Systems  Constitutional: Negative for fever.  Respiratory: Negative for shortness of breath.   Cardiovascular: Negative for chest pain.  Gastrointestinal: Positive for nausea, abdominal pain and diarrhea.  Genitourinary: Negative for dysuria.  Musculoskeletal: Negative for myalgias.  Skin: Negative for pallor and rash.  Neurological: Positive for weakness.    Allergies  Review of patient's allergies indicates no known allergies.  Home Medications   Current Outpatient Rx  Name Route Sig Dispense Refill  . ASPIRIN 81 MG PO TBEC Oral Take 81 mg by mouth daily.      Marland Kitchen BISMUTH SUBSALICYLATE 262 MG/15ML PO SUSP Oral Take 15 mLs by mouth every 6 (six) hours as needed. For upset stomach.    Marland Kitchen DILTIAZEM HCL ER BEADS 240 MG PO CP24 Oral Take 1 capsule (240 mg total) by mouth daily. 30 capsule 11  . LANSOPRAZOLE 30 MG PO CPDR Oral Take 1 capsule (30 mg total) by mouth daily. Take twice a day until office follow up Wednesday August 28 30 capsule 5  . LEVOTHYROXINE SODIUM 25 MCG PO TABS Oral Take 1 tablet (25 mcg total)  by mouth daily. 30 tablet 1    Patient needs to make appointment for labs and off ...  . PROMETHAZINE HCL 12.5 MG PO TABS Oral Take 12.5 mg by mouth every 6 (six) hours as needed.    Marland Kitchen VYTORIN 10-80 MG PO TABS  TAKE ONE TABLET BY MOUTH DAILY 30 tablet 11    CYCLE FILL MEDICATION. Authorization is required f ...  . AMITRIPTYLINE HCL 25 MG PO TABS Oral Take 50 mg by mouth daily.    Marland Kitchen DICYCLOMINE HCL 20 MG PO TABS Oral Take 1 tablet (20 mg total) by mouth 2 (two) times daily. 20 tablet 0  . LORAZEPAM 1 MG PO TABS  One tablet up to 3 times daily for nausea 15 tablet 0  . PROMETHAZINE HCL 12.5 MG PO TABS Oral Take 1 tablet (12.5 mg total) by mouth every 4 (four)  hours. As needed for headache 30 tablet 0  . PROMETHAZINE HCL 12.5 MG PO TABS Oral Take 1 tablet (12.5 mg total) by mouth every 4 (four) hours as needed. 30 tablet 1  . SERTRALINE HCL 50 MG PO TABS Oral Take 1 tablet (50 mg total) by mouth daily. 30 tablet 6    BP 129/105  Pulse 99  Temp 98.3 F (36.8 C) (Oral)  Resp 18  SpO2 100%  Physical Exam  Constitutional: He is oriented to person, place, and time. He appears well-developed and well-nourished.  HENT:  Head: Normocephalic.  Mouth/Throat: Mucous membranes are dry.  Neck: Normal range of motion. Neck supple.  Cardiovascular: Normal rate and regular rhythm.   Pulmonary/Chest: Effort normal and breath sounds normal.  Abdominal: Soft. There is tenderness. There is no rebound and no guarding.       Generalized abdominal tenderness without mass or guarding. Soft abdomen.  Musculoskeletal: Normal range of motion.  Neurological: He is alert and oriented to person, place, and time.  Skin: Skin is warm and dry. No rash noted.  Psychiatric: He has a normal mood and affect.    ED Course  Procedures (including critical care time)  Labs Reviewed  CBC WITH DIFFERENTIAL - Abnormal; Notable for the following:    WBC 14.1 (*)     RBC 5.88 (*)     Hemoglobin 17.6 (*)     Neutro Abs 9.2 (*)     Monocytes Absolute 1.1 (*)     All other components within normal limits  BASIC METABOLIC PANEL - Abnormal; Notable for the following:    Glucose, Bld 115 (*)     Calcium 10.8 (*)     GFR calc non Af Amer 78 (*)     All other components within normal limits  URINALYSIS, ROUTINE W REFLEX MICROSCOPIC   No results found.   1. Gastroenteritis       MDM  The patient was hydrated with IV fluids, given medication for pain and nausea and observed over an extended period of time. After hydration he reports feeling improved. He is tolerating and wants PO fluids and is starting to attempt light solids - crackers. Diarrhea becomes less frequent.  Discussed with GI - ok to discharge patient home and follow up in office. No re-admission required.        Rodena Medin, PA-C 01/11/12 1105

## 2012-01-15 NOTE — ED Provider Notes (Signed)
Medical screening examination/treatment/procedure(s) were performed by non-physician practitioner and as supervising physician I was immediately available for consultation/collaboration.  Sandip Power M Andrick Rust, MD 01/15/12 1436 

## 2012-01-17 ENCOUNTER — Other Ambulatory Visit (HOSPITAL_COMMUNITY): Payer: Self-pay | Admitting: Internal Medicine

## 2012-02-13 ENCOUNTER — Other Ambulatory Visit: Payer: Self-pay | Admitting: Internal Medicine

## 2012-03-02 ENCOUNTER — Other Ambulatory Visit: Payer: Self-pay | Admitting: Internal Medicine

## 2012-04-28 ENCOUNTER — Other Ambulatory Visit (HOSPITAL_COMMUNITY): Payer: Self-pay | Admitting: Internal Medicine

## 2012-05-10 ENCOUNTER — Encounter: Payer: Self-pay | Admitting: Internal Medicine

## 2012-05-10 ENCOUNTER — Ambulatory Visit (INDEPENDENT_AMBULATORY_CARE_PROVIDER_SITE_OTHER): Payer: BC Managed Care – PPO | Admitting: Internal Medicine

## 2012-05-10 ENCOUNTER — Telehealth: Payer: Self-pay | Admitting: Internal Medicine

## 2012-05-10 ENCOUNTER — Ambulatory Visit: Payer: Commercial Managed Care - PPO | Admitting: Internal Medicine

## 2012-05-10 ENCOUNTER — Ambulatory Visit (INDEPENDENT_AMBULATORY_CARE_PROVIDER_SITE_OTHER)
Admission: RE | Admit: 2012-05-10 | Discharge: 2012-05-10 | Disposition: A | Discharge status: Home / Self Care | Payer: BC Managed Care – PPO | Source: Ambulatory Visit | Attending: Internal Medicine | Admitting: Internal Medicine

## 2012-05-10 VITALS — BP 130/80 | HR 77 | Temp 98.1°F | Resp 16 | Wt 234.0 lb

## 2012-05-10 DIAGNOSIS — M79604 Pain in right leg: Secondary | ICD-10-CM

## 2012-05-10 DIAGNOSIS — M545 Low back pain, unspecified: Secondary | ICD-10-CM

## 2012-05-10 MED ORDER — OXYCODONE-ACETAMINOPHEN 7.5-325 MG PO TABS: 1.0000 | ORAL_TABLET | ORAL | Status: AC | PRN

## 2012-05-10 MED ORDER — CYCLOBENZAPRINE HCL 10 MG PO TABS: 10.0000 mg | ORAL_TABLET | Freq: Three times a day (TID) | ORAL | Status: DC | PRN

## 2012-05-10 NOTE — Patient Instructions (Signed)
Back Pain, Adult Low back pain is very common. About 1 in 5 people have back pain.The cause of low back pain is rarely dangerous. The pain often gets better over time.About half of people with a sudden onset of back pain feel better in just 2 weeks. About 8 in 10 people feel better by 6 weeks.  CAUSES Some common causes of back pain include:  Strain of the muscles or ligaments supporting the spine.  Wear and tear (degeneration) of the spinal discs.  Arthritis.  Direct injury to the back. DIAGNOSIS Most of the time, the direct cause of low back pain is not known.However, back pain can be treated effectively even when the exact cause of the pain is unknown.Answering your caregiver's questions about your overall health and symptoms is one of the most accurate ways to make sure the cause of your pain is not dangerous. If your caregiver needs more information, he or she may order lab work or imaging tests (X-rays or MRIs).However, even if imaging tests show changes in your back, this usually does not require surgery. HOME CARE INSTRUCTIONS For many people, back pain returns.Since low back pain is rarely dangerous, it is often a condition that people can learn to manageon their own.   Remain active. It is stressful on the back to sit or stand in one place. Do not sit, drive, or stand in one place for more than 30 minutes at a time. Take short walks on level surfaces as soon as pain allows.Try to increase the length of time you walk each day.  Do not stay in bed.Resting more than 1 or 2 days can delay your recovery.  Do not avoid exercise or work.Your body is made to move.It is not dangerous to be active, even though your back may hurt.Your back will likely heal faster if you return to being active before your pain is gone.  Pay attention to your body when you bend and lift. Many people have less discomfortwhen lifting if they bend their knees, keep the load close to their bodies,and  avoid twisting. Often, the most comfortable positions are those that put less stress on your recovering back.  Find a comfortable position to sleep. Use a firm mattress and lie on your side with your knees slightly bent. If you lie on your back, put a pillow under your knees.  Only take over-the-counter or prescription medicines as directed by your caregiver. Over-the-counter medicines to reduce pain and inflammation are often the most helpful.Your caregiver may prescribe muscle relaxant drugs.These medicines help dull your pain so you can more quickly return to your normal activities and healthy exercise.  Put ice on the injured area.  Put ice in a plastic bag.  Place a towel between your skin and the bag.  Leave the ice on for 15 to 20 minutes, 3 to 4 times a day for the first 2 to 3 days. After that, ice and heat may be alternated to reduce pain and spasms.  Ask your caregiver about trying back exercises and gentle massage. This may be of some benefit.  Avoid feeling anxious or stressed.Stress increases muscle tension and can worsen back pain.It is important to recognize when you are anxious or stressed and learn ways to manage it.Exercise is a great option. SEEK MEDICAL CARE IF:  You have pain that is not relieved with rest or medicine.  You have pain that does not improve in 1 week.  You have new symptoms.  You are generally   not feeling well. SEEK IMMEDIATE MEDICAL CARE IF:   You have pain that radiates from your back into your legs.  You develop new bowel or bladder control problems.  You have unusual weakness or numbness in your arms or legs.  You develop nausea or vomiting.  You develop abdominal pain.  You feel faint. Document Released: 04/10/2005 Document Revised: 10/10/2011 Document Reviewed: 08/29/2010 [employer] Patient Information 2013 [employer].  

## 2012-05-10 NOTE — Telephone Encounter (Signed)
Patient Information:  Caller Name: [name]  Phone: [phone]  Patient: [name], [name]  Gender: Male  DOB: August 15, 1960  Age: 52 Years  PCP: Illene Regulus (Adults only)  Office Follow Up:  Does the office need to follow up with this patient?: No  Instructions For The Office: N/A  RN Note:  Advised patient to be seen before the 2:15pm appointment. Was taking Voltaren and Robaxin for the pain with no relief.  Symptoms  Reason For Call & Symptoms: Reports lower back pain; reports pain is radiating down his right leg. Reports severe pain when trying to have a bowel movement. At times, the pain is radiating to the abdomen. Reports pressure to the groin area. Patient reports that he has scheduled an appointment for this afternoon at 2:15pm.  Reviewed Health History In EMR: Yes  Reviewed Medications In EMR: Yes  Reviewed Allergies In EMR: Yes  Reviewed Surgeries / Procedures: Yes  Date of Onset of Symptoms: 05/05/2012  Treatments Tried: Muscle relaxer, pain medication, heat, cold  Treatments Tried Worked: No  Guideline(s) Used:  Back Pain  Back Injury  Disposition Per Guideline:   Go to ED Now (or to Office with PCP Approval)  RN override disposition: See in office vs ED.  Reason For Disposition Reached:   Pain radiates into groin, scrotum  Advice Given:  Call Back If:  You become worse.  Appointment Scheduled:  05/10/2012 11:15:00 Appointment Scheduled Provider:  Sanda Linger (Adults only)

## 2012-05-12 NOTE — Assessment & Plan Note (Signed)
I think he has an acute disc herniation - his plain films are negative for fracture He will continue nsaids, I gave him an injection of depo-medrol IM today to reduce the pain and inflammation He will also try flexeril and percocet for pain relief He will let me know if he develops any new or worsening symptoms

## 2012-05-12 NOTE — Progress Notes (Signed)
Subjective:    Patient ID: [name], male    DOB: 1961/03/17, 52 y.o.   MRN: [id number]  Back Pain This is a new problem. The current episode started in the past 7 days. The problem occurs daily. The problem is unchanged. The pain is present in the lumbar spine. The quality of the pain is described as stabbing and aching. The pain radiates to the right foot. The pain is at a severity of 3/10. The pain is mild. The pain is worse during the day. The symptoms are aggravated by position, bending and standing. Stiffness is present in the morning. Associated symptoms include leg pain, numbness (right leg), paresthesias and tingling (right leg). Pertinent negatives include no abdominal pain, bladder incontinence, bowel incontinence, chest pain, dysuria, fever, headaches, paresis, pelvic pain, perianal numbness, weakness or weight loss. Risk factors include recent trauma (he fell 5 days ago). He has tried NSAIDs for the symptoms. The treatment provided mild relief.      Review of Systems  Constitutional: Negative for fever, chills, weight loss, diaphoresis, activity change, appetite change, fatigue and unexpected weight change.  HENT: Negative.   Eyes: Negative.   Respiratory: Negative for cough, chest tightness, shortness of breath, wheezing and stridor.   Cardiovascular: Negative for chest pain, palpitations and leg swelling.  Gastrointestinal: Negative for abdominal pain and bowel incontinence.  Genitourinary: Negative.  Negative for bladder incontinence, dysuria and pelvic pain.  Musculoskeletal: Positive for back pain. Negative for myalgias, joint swelling, arthralgias and gait problem.  Skin: Negative.   Neurological: Positive for tingling (right leg), numbness (right leg) and paresthesias. Negative for dizziness, tremors, seizures, syncope, facial asymmetry, speech difficulty, weakness, light-headedness and headaches.  Hematological: Negative for adenopathy. Does not bruise/bleed easily.    Psychiatric/Behavioral: Negative.        Objective:   Physical Exam  Vitals reviewed. Constitutional: He is oriented to person, place, and time. He appears well-developed and well-nourished. No distress.  HENT:  Head: Normocephalic and atraumatic.  Mouth/Throat: Oropharynx is clear and moist. No oropharyngeal exudate.  Eyes: Conjunctivae normal are normal. Right eye exhibits no discharge. Left eye exhibits no discharge. No scleral icterus.  Neck: Normal range of motion. Neck supple. No JVD present. No tracheal deviation present. No thyromegaly present.  Cardiovascular: Normal rate, regular rhythm, normal heart sounds and intact distal pulses.  Exam reveals no gallop and no friction rub.   No murmur heard. Pulmonary/Chest: Effort normal and breath sounds normal. No stridor. No respiratory distress. He has no wheezes. He has no rales. He exhibits no tenderness.  Abdominal: Soft. Bowel sounds are normal. He exhibits no distension and no mass. There is no tenderness. There is no rebound and no guarding.  Musculoskeletal: Normal range of motion. He exhibits no edema and no tenderness.  Lymphadenopathy:    He has no cervical adenopathy.  Neurological: He is alert and oriented to person, place, and time. He has normal strength. He displays no atrophy, no tremor and normal reflexes. No cranial nerve deficit or sensory deficit. He exhibits normal muscle tone. He displays a negative Romberg sign. He displays no seizure activity. Coordination and gait normal.  Reflex Scores:      Tricep reflexes are 1+ on the right side and 1+ on the left side.      Bicep reflexes are 1+ on the right side and 1+ on the left side.      Brachioradialis reflexes are 1+ on the right side and 1+ on the left side.  Patellar reflexes are 1+ on the right side and 1+ on the left side.      Achilles reflexes are 1+ on the right side and 1+ on the left side.      + SLR R>>L  Skin: Skin is warm and dry. No rash noted. He  is not diaphoretic. No erythema. No pallor.  Psychiatric: He has a normal mood and affect. His behavior is normal. Judgment and thought content normal.     Dg Lumbar Spine Complete  05/10/2012  *RADIOLOGY REPORT*  Clinical Data: Low back pain after fall.  LUMBAR SPINE - COMPLETE 4+ VIEW  Comparison: CT 12/07/2011  Findings: Early degenerative facet disease at L5-S1.  Normal alignment.  No fracture.  Disc spaces are maintained.  SI joints are symmetric and unremarkable.  IMPRESSION: Early degenerative facet disease at L5-S1.  No acute findings.   Original Report Authenticated By: Charlett Nose, M.D.     Assessment & Plan:

## 2012-05-21 ENCOUNTER — Ambulatory Visit (INDEPENDENT_AMBULATORY_CARE_PROVIDER_SITE_OTHER): Payer: BC Managed Care – PPO | Admitting: Internal Medicine

## 2012-05-21 ENCOUNTER — Encounter: Payer: Self-pay | Admitting: Internal Medicine

## 2012-05-21 VITALS — BP 130/88 | HR 81 | Temp 97.7°F | Resp 12 | Wt 233.0 lb

## 2012-05-21 DIAGNOSIS — M79604 Pain in right leg: Secondary | ICD-10-CM

## 2012-05-21 DIAGNOSIS — M545 Low back pain, unspecified: Secondary | ICD-10-CM

## 2012-05-21 MED ORDER — CYCLOBENZAPRINE HCL 10 MG PO TABS: 10.0000 mg | ORAL_TABLET | Freq: Three times a day (TID) | ORAL | Status: DC | PRN

## 2012-05-21 MED ORDER — OXYCODONE HCL 5 MG PO TABS: 5.0000 mg | ORAL_TABLET | Freq: Four times a day (QID) | ORAL | Status: DC | PRN

## 2012-05-21 MED ORDER — PREDNISONE 10 MG PO TABS: 10.0000 mg | ORAL_TABLET | ORAL | Status: DC

## 2012-05-21 NOTE — Progress Notes (Signed)
  Subjective:    Patient ID: [name], male    DOB: 12-29-1960, 52 y.o.   MRN: [id number]  HPI Seen the 17th for low back pain with intermittent paresthesia of the right leg and foot. His exam was non-focal. LS-Spine films with no fracture and well preserved disk space. He was prescribed percocent and flexeril, was given depomedrol and NSAIDs. He saw some improvement but after a long drive to [place] he is having more pain and paresthesia.  PMH, FamHx and SocHx reviewed for any changes and relevance. Current Outpatient Prescriptions on File Prior to Visit  Medication Sig Dispense Refill  . amitriptyline (ELAVIL) 25 MG tablet Take 50 mg by mouth daily.      Marland Kitchen aspirin 81 MG EC tablet Take 81 mg by mouth daily.        Marland Kitchen bismuth subsalicylate (PEPTO BISMOL) 262 MG/15ML suspension Take 15 mLs by mouth every 6 (six) hours as needed. For upset stomach.      . diltiazem (TIAZAC) 240 MG 24 hr capsule Take 1 capsule (240 mg total) by mouth daily.  30 capsule  11  . diphenoxylate-atropine (LOMOTIL) 2.5-0.025 MG per tablet TAKE ONE TABLET TWICE DAILY  60 tablet  0  . lansoprazole (PREVACID) 30 MG capsule TAKE 1 CAPSULE (30 MG TOTAL) BY MOUTH DAILY. TAKE TWICE A DAY UNTIL OFFICE FOLLOW UP WEDNESDAY AUGUST 28  30 capsule  5  . levothyroxine (SYNTHROID, LEVOTHROID) 25 MCG tablet TAKE 1 TABLET (25 MCG TOTAL) BY MOUTH DAILY.  30 tablet  3  . LORazepam (ATIVAN) 1 MG tablet One tablet up to 3 times daily for nausea  15 tablet  0  . sertraline (ZOLOFT) 50 MG tablet Take 50 mg by mouth daily.      Marland Kitchen VYTORIN 10-80 MG per tablet TAKE ONE TABLET BY MOUTH DAILY  30 tablet  11  . dicyclomine (BENTYL) 20 MG tablet Take 1 tablet (20 mg total) by mouth 2 (two) times daily.  20 tablet  0      Review of Systems System review is negative for any constitutional, cardiac, pulmonary, GI or neuro symptoms or complaints other than as described in the HPI.     Objective:   Physical Exam Filed Vitals:   05/21/12  1159  BP: 130/88  Pulse: 81  Temp: 97.7 F (36.5 C)  Resp: 12   gen'l- WNWD man in no acute distress Cor- RRR PUlm - normals Back exam: normal stand; normal flex to greater than 100 degrees; normal gait; normal toe/heel walk but with some discomfort and paresthesia; normal step up to exam table; normal SLR sitting, but with some discomfort on the right; normal DTRs at the patellar tendons; decreased sensation to light touch, pin-prick and deep vibratory stimulus lateral ankle and foot; no  CVA tenderness; able to move supine to sitting witout assistance.        Assessment & Plan:

## 2012-05-21 NOTE — Patient Instructions (Addendum)
Paresthesia (numbness and tingling) right ankle and foot more to the lateral aspect is suggestive of nerve pressure at the L5-S1 level, most likely from a mild herniated disk or disk fragment. The x-rays show well preserved disk spaces and bony alignment. Your exam is not revealing a severe, acute problem that demands immediate surgical consultation.  Plan Steroid burst and taper: prednisone 40 mg daily x 3 days, 30 mg daily x 3 days, 20 mg daily x 3 days and then 10 mg daily x 6 days.  Continue the flexeril  For pain - first choice is tylenol 1, 000 mg three times. For severe unrelieved pain Oxycodone 5 mg no more often then every 6 hours.  If there is no improvement on the steroids, if the pain/paresthesia comes back after steroids or if it gets worse - MRI lumbar spine.

## 2012-05-22 NOTE — Assessment & Plan Note (Signed)
Paresthesia (numbness and tingling) right ankle and foot more to the lateral aspect is suggestive of nerve pressure at the L5-S1 level, most likely from a mild herniated disk or disk fragment. The x-rays show well preserved disk spaces and bony alignment. Your exam is not revealing a severe, acute problem that demands immediate surgical consultation.  Plan Steroid burst and taper: prednisone 40 mg daily x 3 days, 30 mg daily x 3 days, 20 mg daily x 3 days and then 10 mg daily x 6 days.  Continue the flexeril  For pain - first choice is tylenol 1, 000 mg three times. For severe unrelieved pain Oxycodone 5 mg no more often then every 6 hours.  If there is no improvement on the steroids, if the pain/paresthesia comes back after steroids or if it gets worse - MRI lumbar spine. 

## 2012-06-14 ENCOUNTER — Other Ambulatory Visit: Payer: Self-pay | Admitting: *Deleted

## 2012-06-14 NOTE — Telephone Encounter (Signed)
Opened encounter in error  

## 2012-06-18 ENCOUNTER — Other Ambulatory Visit: Payer: Self-pay | Admitting: Internal Medicine

## 2012-09-02 ENCOUNTER — Encounter: Payer: Self-pay | Admitting: Internal Medicine

## 2012-09-02 ENCOUNTER — Ambulatory Visit (INDEPENDENT_AMBULATORY_CARE_PROVIDER_SITE_OTHER): Payer: BC Managed Care – PPO | Admitting: Internal Medicine

## 2012-09-02 VITALS — BP 120/92 | HR 95 | Temp 97.0°F | Wt 237.0 lb

## 2012-09-02 DIAGNOSIS — J320 Chronic maxillary sinusitis: Secondary | ICD-10-CM

## 2012-09-02 DIAGNOSIS — J3489 Other specified disorders of nose and nasal sinuses: Secondary | ICD-10-CM

## 2012-09-02 DIAGNOSIS — J309 Allergic rhinitis, unspecified: Secondary | ICD-10-CM

## 2012-09-02 DIAGNOSIS — J340 Abscess, furuncle and carbuncle of nose: Secondary | ICD-10-CM

## 2012-09-02 MED ORDER — SULFAMETHOXAZOLE-TRIMETHOPRIM 800-160 MG PO TABS: 1.0000 | ORAL_TABLET | Freq: Two times a day (BID) | ORAL | Status: AC

## 2012-09-02 MED ORDER — IBUPROFEN 200 MG PO TABS: 200.0000 mg | ORAL_TABLET | Freq: Four times a day (QID) | ORAL | Status: DC | PRN

## 2012-09-02 MED ORDER — PHENYLEPH-PROMETHAZINE-COD 5-6.25-10 MG/5ML PO SYRP: 5.0000 mL | ORAL_SOLUTION | Freq: Four times a day (QID) | ORAL | Status: DC | PRN

## 2012-09-02 NOTE — Patient Instructions (Signed)
It was good to see you today. We have reviewed your prior records including labs and tests today Septra antibiotics 2x/day x 10days Promethazine vc with codeine - prescription syrup to treat nasal congestion and pain Your prescription(s) have been submitted to your pharmacy. Please take as directed and contact our office if you believe you are having problem(s) with the medication(s). Continue over-the-counter ibuprofen such as Advil 2 tablets 3 times daily for the next week as needed for pain Continue saline rinse of your nose and sinuses Call symptoms worse or unimproved in the next 72 hours

## 2012-09-02 NOTE — Progress Notes (Signed)
Subjective:    HPI  complains of head cold symptoms, ?sinusitus Onset >4 week ago, initially improved then relapsing and worse symptoms  First associated with rhinorrhea, sneezing, sore throat, mild headache and low grade fever Now sinus pressure, mod nasal congestion, yellow-green discharge Also swelling of nose, esp right side with pain and "pus" x 24h No relief with OTC meds Precipitated by sick contacts and weather change  Past Medical History  Diagnosis Date  . Hypertension   . Unspecified intestinal obstruction   . Unspecified asthma   . Chest pain, atypical   . Nontoxic multinodular goiter   . Painful respiration   . Dyspnea on exertion   . Trigeminal neuralgia   . Cluster headache   . Hyperlipidemia   . GERD (gastroesophageal reflux disease)   . Complication of anesthesia     difficult waking up"  . Food poisoning 11/2011  . Multiple thyroid nodules     Review of Systems Constitutional: No night sweats, no unexpected weight change Pulmonary: No pleurisy or hemoptysis Cardiovascular: No chest pain or palpitations     Objective:   Physical Exam BP 120/92  Pulse 95  Temp(Src) 97 F (36.1 C) (Oral)  Wt 237 lb (107.502 kg)  BMI 34.01 kg/m2  SpO2 97% GEN: mildly ill appearing and audible head congestion HENT: NCAT, mild sinus tenderness bilaterally, nares with thick discharge and turbinate swelling, oropharynx mild erythema and PND, no exudate. Nose: right nostril with small boil anterior septal side, spontaneously drained with soft tissue swelling, no purulence expressed Eyes: Vision grossly intact, no conjunctivitis Lungs: Clear to auscultation without rhonchi or wheeze, no increased work of breathing Cardiovascular: Regular rate and rhythm, no bilateral edema  Lab Results  Component Value Date   WBC 14.1* 12/25/2011   HGB 17.6* 12/25/2011   HCT 49.4 12/25/2011   PLT 263 12/25/2011   GLUCOSE 115* 12/25/2011   CHOL 266* 07/20/2009   TRIG 76.0 07/20/2009   HDL  53.50 07/20/2009   LDLDIRECT 209.8 07/20/2009   LDLCALC  Value: 196        Total Cholesterol/HDL:CHD Risk Coronary Heart Disease Risk Table                     Men   Women  1/2 Average Risk   3.4   3.3  Average Risk       5.0   4.4  2 X Average Risk   9.6   7.1  3 X Average Risk  23.4   11.0        Use the calculated Patient Ratio above and the CHD Risk Table to determine the patient's CHD Risk.        ATP III CLASSIFICATION (LDL):  <100     mg/dL   Optimal  409-811  mg/dL   Near or Above                    Optimal  130-159  mg/dL   Borderline  914-782  mg/dL   High  >956     mg/dL   Very High* 06/07/863   ALT 9 12/06/2011   AST 11 12/06/2011   NA 140 12/25/2011   K 4.2 12/25/2011   CL 103 12/25/2011   CREATININE 1.10 12/25/2011   BUN 10 12/25/2011   CO2 19 12/25/2011   TSH 0.62 07/20/2009   PSA 1.02 08/20/2007   INR 1.0 12/08/2008   HGBA1C  Value: 5.4 (NOTE) The ADA recommends the following  therapeutic goal for glycemic control related to Hgb A1c measurement: Goal of therapy: <6.5 Hgb A1c  Reference: American Diabetes Association: Clinical Practice Recommendations 2010, Diabetes Care, 2010, 33: (Suppl  1). 12/07/2008      Assessment & Plan:   acute sinusitis, maxillary allergic rhinitis, seasonal flare -  Nostril abscess - near septal wall of R side, spontaneously drained in past 24 hours Cough, postnasal drip related to above   Empiric antibiotics prescribed due to R nostril abscess and progression despite OTC symptomatic care Prescription cough suppression - new prescriptions done Symptomatic care with Tylenol or Advil, decongestants, antihistamine, hydration and rest -  Saline irrigation and salt gargle advised as needed

## 2012-09-26 ENCOUNTER — Ambulatory Visit (INDEPENDENT_AMBULATORY_CARE_PROVIDER_SITE_OTHER): Payer: BC Managed Care – PPO | Admitting: Internal Medicine

## 2012-09-26 ENCOUNTER — Encounter: Payer: Self-pay | Admitting: Internal Medicine

## 2012-09-26 VITALS — BP 124/84 | HR 88 | Temp 97.0°F | Ht 70.5 in | Wt 240.4 lb

## 2012-09-26 DIAGNOSIS — L84 Corns and callosities: Secondary | ICD-10-CM

## 2012-09-26 DIAGNOSIS — F419 Anxiety disorder, unspecified: Secondary | ICD-10-CM

## 2012-09-26 DIAGNOSIS — M79672 Pain in left foot: Secondary | ICD-10-CM | POA: Insufficient documentation

## 2012-09-26 DIAGNOSIS — M79671 Pain in right foot: Secondary | ICD-10-CM | POA: Insufficient documentation

## 2012-09-26 DIAGNOSIS — I10 Essential (primary) hypertension: Secondary | ICD-10-CM

## 2012-09-26 DIAGNOSIS — M79609 Pain in unspecified limb: Secondary | ICD-10-CM

## 2012-09-26 DIAGNOSIS — F411 Generalized anxiety disorder: Secondary | ICD-10-CM

## 2012-09-26 MED ORDER — DOXYCYCLINE HYCLATE 100 MG PO CAPS: 100.0000 mg | ORAL_CAPSULE | Freq: Two times a day (BID) | ORAL | Status: DC

## 2012-09-26 MED ORDER — TRAMADOL HCL 50 MG PO TABS: 50.0000 mg | ORAL_TABLET | Freq: Four times a day (QID) | ORAL | Status: DC | PRN

## 2012-09-26 NOTE — Patient Instructions (Signed)
Please take all new medication as prescribed - for pain, and the antibiotic Please continue all other medications as before You will be contacted regarding the referral for: podiatry (urgent)

## 2012-09-26 NOTE — Assessment & Plan Note (Signed)
stable overall by history and exam, and pt to continue medical treatment as before,  to f/u any worsening symptoms or concerns;le  

## 2012-09-26 NOTE — Assessment & Plan Note (Signed)
stable overall by history and exam, recent data reviewed with pt, and pt to continue medical treatment as before,  to f/u any worsening symptoms or concerns BP Readings from Last 3 Encounters:  09/26/12 124/84  09/02/12 120/92  05/21/12 130/88

## 2012-09-26 NOTE — Assessment & Plan Note (Signed)
Severe tender left lateral foot, much less right lateral foot, for pain med, podiatry referral

## 2012-09-26 NOTE — Assessment & Plan Note (Signed)
Also ? Of infection related to left - Mild to mod, for antibx course,  to f/u any worsening symptoms or concerns

## 2012-09-26 NOTE — Progress Notes (Signed)
Subjective:    Patient ID: [name], male    DOB: [birth year], 52 y.o.   MRN: [id number]  HPI Pt of Dr Debby Bud, works as Investment banker, operational, stands quite a bit for employment, here today with rather severe very tender lateral aspects of both feet, left much worse than right, sharp, worse to stand and walk, gradually worse x several months, now 8/10, still plans to cont work, no fever, trauma, but has flat feet with somewhat widened mid foot, with constant rubbing at lateral aspects with his shoes.  Has had callouses build up, now so tender at the highest friction point, he cannot even touch.  No claudication or numbness.  No hx of DM. Has not seen podiatry in past for this, has been trying to put off, thinking would eventaully get better. Pt denies chest pain, increased sob or doe, wheezing, orthopnea, PND, increased LE swelling, palpitations, dizziness or syncope.Pt denies new neurological symptoms such as new headache, or facial or extremity weakness or numbness   Pt denies polydipsia, polyuria. Denies worsening depressive symptoms, suicidal ideation, or panic Past Medical History  Diagnosis Date  . Hypertension   . Unspecified intestinal obstruction   . Unspecified asthma(493.90)   . Chest pain, atypical   . Nontoxic multinodular goiter   . Painful respiration   . Dyspnea on exertion   . Trigeminal neuralgia   . Cluster headache   . Hyperlipidemia   . GERD (gastroesophageal reflux disease)   . Complication of anesthesia     difficult waking up"  . Food poisoning 11/2011  . Multiple thyroid nodules    Past Surgical History  Procedure Laterality Date  . Appendectomy    . Knee arthroscopy    . 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 Right shoulder redo-reconstruction  06/21/10  . Destruction trigeminal nerve via neurolytic agent      reports that he has been smoking Cigarettes.  He has a 4.5 pack-year smoking history. He has never used smokeless tobacco. He  reports that he does not drink alcohol or use illicit drugs. family history includes Cancer in his father; Diabetes in his mother; Heart disease in his father and maternal aunt; Hyperlipidemia in his mother; and Hypertension in his father and mother.  There is no history of Prostate cancer and Colon cancer. No Known Allergies Current Outpatient Prescriptions on File Prior to Visit  Medication Sig Dispense Refill  . amitriptyline (ELAVIL) 25 MG tablet Take 50 mg by mouth daily.      Marland Kitchen aspirin 81 MG EC tablet Take 81 mg by mouth daily.        Marland Kitchen diltiazem (TIAZAC) 240 MG 24 hr capsule Take 1 capsule (240 mg total) by mouth daily.  30 capsule  11  . ibuprofen (ADVIL) 200 MG tablet Take 1 tablet (200 mg total) by mouth every 6 (six) hours as needed for pain.  30 tablet  0  . lansoprazole (PREVACID) 30 MG capsule TAKE 1 CAPSULE (30 MG TOTAL) BY MOUTH DAILY. TAKE TWICE A DAY UNTIL OFFICE FOLLOW UP WEDNESDAY AUGUST 28  30 capsule  5  . levothyroxine (SYNTHROID, LEVOTHROID) 25 MCG tablet TAKE 1 TABLET (25 MCG TOTAL) BY MOUTH DAILY.  30 tablet  3  . LORazepam (ATIVAN) 1 MG tablet One tablet up to 3 times daily for nausea  15 tablet  0  . pantoprazole (PROTONIX) 40 MG tablet TAKE 1 TABLET (40 MG TOTAL) BY MOUTH DAILY.  90 tablet  2  . Phenyleph-Promethazine-Cod 5-6.25-10 MG/5ML SYRP Take 5 mLs by mouth every 6 (six) hours as needed (cough and congestion).  200 mL  0  . VYTORIN 10-80 MG per tablet TAKE ONE TABLET BY MOUTH DAILY  30 tablet  11   No current facility-administered medications on file prior to visit.   Review of Systems   Constitutional: Negative for unexpected weight change, or unusual diaphoresis  HENT: Negative for tinnitus.   Eyes: Negative for photophobia and visual disturbance.  Respiratory: Negative for choking and stridor.   Gastrointestinal: Negative for vomiting and blood in stool.  Genitourinary: Negative for hematuria and decreased urine volume.  Musculoskeletal: Negative for  acute joint swelling Skin: Negative for color change and wound.  Neurological: Negative for tremors and numbness other than noted  Psychiatric/Behavioral: Negative for decreased concentration or  hyperactivity.      Objective:   Physical Exam BP 124/84  Pulse 88  Temp(Src) 97 F (36.1 C) (Oral)  Ht 5' 10.5" (1.791 m)  Wt 240 lb 6 oz (109.033 kg)  BMI 33.99 kg/m2  SpO2 96% VS noted,  Constitutional: Pt appears well-developed and well-nourished.  HENT: Head: NCAT.  Right Ear: External ear normal.  Left Ear: External ear normal.  Eyes: Conjunctivae and EOM are normal. Pupils are equal, round, and reactive to light.  Neck: Normal range of motion. Neck supple.  Cardiovascular: Normal rate and regular rhythm.   Pulmonary/Chest: Effort normal and breath sounds normal.  Abd:  Soft, NT, non-distended, + BS Neurological: Pt is alert. Not confused  Skin: bilat lateral aspects of feet with severe tender callous mild left lateral with ? Mild erythema, non fluctuant or drainage, right lateral foot with similar mild tender callousity.  Psychiatric: Pt behavior is normal. Thought content normal. not nervous appearing    Assessment & Plan:

## 2012-10-21 ENCOUNTER — Telehealth: Payer: Self-pay | Admitting: *Deleted

## 2012-10-21 NOTE — Telephone Encounter (Signed)
A user error has taken place:by Luanna Salk, CMA

## 2012-11-21 ENCOUNTER — Encounter: Payer: Self-pay | Admitting: Internal Medicine

## 2012-11-21 ENCOUNTER — Ambulatory Visit (INDEPENDENT_AMBULATORY_CARE_PROVIDER_SITE_OTHER): Payer: BC Managed Care – PPO | Admitting: Internal Medicine

## 2012-11-21 VITALS — BP 156/98 | HR 89 | Temp 98.0°F | Wt 242.0 lb

## 2012-11-21 DIAGNOSIS — IMO0002 Reserved for concepts with insufficient information to code with codable children: Secondary | ICD-10-CM

## 2012-11-21 DIAGNOSIS — M5416 Radiculopathy, lumbar region: Secondary | ICD-10-CM

## 2012-11-21 MED ORDER — METHYLPREDNISOLONE ACETATE 80 MG/ML IJ SUSP
80.0000 mg | Freq: Once | INTRAMUSCULAR | Status: AC
  Administered 2012-11-21: 80 mg via INTRAMUSCULAR

## 2012-11-21 MED ORDER — CYCLOBENZAPRINE HCL 10 MG PO TABS: 10.0000 mg | ORAL_TABLET | Freq: Three times a day (TID) | ORAL | Status: DC | PRN

## 2012-11-21 MED ORDER — HYDROCODONE-ACETAMINOPHEN 10-325 MG PO TABS: 1.0000 | ORAL_TABLET | Freq: Three times a day (TID) | ORAL | Status: DC | PRN

## 2012-11-21 MED ORDER — PREDNISONE 10 MG PO TABS: ORAL_TABLET | ORAL | Status: DC

## 2012-11-21 NOTE — Progress Notes (Signed)
Subjective:    Patient ID: [name], male    DOB: 10/15/1960, 52 y.o.   MRN: [id number]  HPI  Pt presents to the clinic today with c/o back pain. This started 4 days ago. The pain is across the lower back. The pain does radiate into both legs.He does describe tingling in both legs R>L.  He may have strained it while moving a tire out of the back of a car and putting a lawnmower in the trunk. The pain is worse when sitting/walking. He has used a heating pad which does not help. He has taken 800 mg ibuprofen and tramadol which has not helped. He denies loss of bowel or bladder. He did have a similar episode of back pain in January 2014. He was treated at that time with steroids, muscle relaxers and oxycodone. An xray of the back was performed which was normal.  Review of Systems      Past Medical History  Diagnosis Date  . Hypertension   . Unspecified intestinal obstruction   . Unspecified asthma(493.90)   . Chest pain, atypical   . Nontoxic multinodular goiter   . Painful respiration   . Dyspnea on exertion   . Trigeminal neuralgia   . Cluster headache   . Hyperlipidemia   . GERD (gastroesophageal reflux disease)   . Complication of anesthesia     difficult waking up"  . Food poisoning 11/2011  . Multiple thyroid nodules     Current Outpatient Prescriptions  Medication Sig Dispense Refill  . amitriptyline (ELAVIL) 25 MG tablet Take 50 mg by mouth daily.      Marland Kitchen aspirin 81 MG EC tablet Take 81 mg by mouth daily.        Marland Kitchen diltiazem (TIAZAC) 240 MG 24 hr capsule Take 1 capsule (240 mg total) by mouth daily.  30 capsule  11  . doxycycline (VIBRAMYCIN) 100 MG capsule Take 1 capsule (100 mg total) by mouth 2 (two) times daily.  20 capsule  0  . ibuprofen (ADVIL) 200 MG tablet Take 1 tablet (200 mg total) by mouth every 6 (six) hours as needed for pain.  30 tablet  0  . lansoprazole (PREVACID) 30 MG capsule TAKE 1 CAPSULE (30 MG TOTAL) BY MOUTH DAILY. TAKE TWICE A DAY UNTIL OFFICE  FOLLOW UP WEDNESDAY AUGUST 28  30 capsule  5  . levothyroxine (SYNTHROID, LEVOTHROID) 25 MCG tablet TAKE 1 TABLET (25 MCG TOTAL) BY MOUTH DAILY.  30 tablet  3  . LORazepam (ATIVAN) 1 MG tablet One tablet up to 3 times daily for nausea  15 tablet  0  . pantoprazole (PROTONIX) 40 MG tablet TAKE 1 TABLET (40 MG TOTAL) BY MOUTH DAILY.  90 tablet  2  . Phenyleph-Promethazine-Cod 5-6.25-10 MG/5ML SYRP Take 5 mLs by mouth every 6 (six) hours as needed (cough and congestion).  200 mL  0  . traMADol (ULTRAM) 50 MG tablet Take 1 tablet (50 mg total) by mouth every 6 (six) hours as needed for pain.  60 tablet  1  . VYTORIN 10-80 MG per tablet TAKE ONE TABLET BY MOUTH DAILY  30 tablet  11   No current facility-administered medications for this visit.    No Known Allergies  Family History  Problem Relation Age of Onset  . Diabetes Mother   . Hyperlipidemia Mother   . Hypertension Mother   . Hypertension Father   . Heart disease Father     CAD/CHF  . Cancer Father  Mesothelioma  . Heart disease Maternal Aunt   . Prostate cancer Neg Hx   . Colon cancer Neg Hx     History   Social History  . Marital Status: Married    Spouse Name: N/A    Number of Children: N/A  . 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 Smoking status: Current Every Day Smoker -- 0.25 packs/day for 18 years    Types: Cigarettes  . Smokeless tobacco: Never Used     Comment: 1-2 cigs per day, started age 79  . Alcohol Use: No     Comment: rare  . Drug Use: No  . Sexually Active: Not Currently   Other Topics Concern  . Not on file   Social History Narrative   [employer]   Culinary school in [place]   Married - '84 - 5 years divorced; remarried '96   1 son - '85      Regular Exercise -  NO           Constitutional: Denies fever, malaise, fatigue, headache or abrupt weight changes. . Musculoskeletal: Pt reports back pain. Denies  decrease in range of motion, difficulty with gait, or joint pain and swelling.  Neurological: Pt reports tingling pain in legs. Denies dizziness, difficulty with memory, difficulty with speech or problems with balance and coordination.   No other specific complaints in a complete review of systems (except as listed in HPI above).  Objective:   Physical Exam   BP 156/98  Pulse 89  Temp(Src) 98 F (36.7 C) (Oral)  Wt 242 lb (109.77 kg)  BMI 34.22 kg/m2  SpO2 96% Wt Readings from Last 3 Encounters:  11/21/12 242 lb (109.77 kg)  09/26/12 240 lb 6 oz (109.033 kg)  09/02/12 237 lb (107.502 kg)    General: Appears his stated age, well developed, well nourished in NAD. Cardiovascular: Normal rate and rhythm. S1,S2 noted.  No murmur, rubs or gallops noted. No JVD or BLE edema. No carotid bruits noted. Pulmonary/Chest: Normal effort and positive vesicular breath sounds. No respiratory distress. No wheezes, rales or ronchi noted.  Musculoskeletal: Normal range of motion. No signs of joint swelling. No difficulty with gait. Strength 5/5 BLE. Neurological: Alert and oriented. Cranial nerves II-XII intact. Coordination normal. +DTRs bilaterally. Sensation intact.  BMET    Component Value Date/Time   NA 140 12/25/2011 1416   K 4.2 12/25/2011 1416   CL 103 12/25/2011 1416   CO2 19 12/25/2011 1416   GLUCOSE 115* 12/25/2011 1416   BUN 10 12/25/2011 1416   CREATININE 1.10 12/25/2011 1416   CALCIUM 10.8* 12/25/2011 1416   GFRNONAA 78* 12/25/2011 1416   GFRAA >90 12/25/2011 1416    Lipid Panel     Component Value Date/Time   CHOL 266* 07/20/2009 0951   TRIG 76.0 07/20/2009 0951   HDL 53.50 07/20/2009 0951   CHOLHDL 5 07/20/2009 0951   VLDL 15.2 07/20/2009 0951   LDLCALC  Value: 196        Total Cholesterol/HDL:CHD Risk Coronary Heart Disease Risk Table                     Men   Women  1/2 Average Risk   3.4   3.3  Average Risk       5.0   4.4  2 X Average Risk   9.6   7.1  3 X Average  Risk  23.4   11.0         Use the calculated Patient Ratio above and the CHD Risk Table to determine the patient's CHD Risk.        ATP III CLASSIFICATION (LDL):  <100     mg/dL   Optimal  914-782  mg/dL   Near or Above                    Optimal  130-159  mg/dL   Borderline  956-213  mg/dL   High  >086     mg/dL   Very High* 5/78/4696 1856    CBC    Component Value Date/Time   WBC 14.1* 12/25/2011 1416   RBC 5.88* 12/25/2011 1416   HGB 17.6* 12/25/2011 1416   HCT 49.4 12/25/2011 1416   PLT 263 12/25/2011 1416   MCV 84.0 12/25/2011 1416   MCH 29.9 12/25/2011 1416   MCHC 35.6 12/25/2011 1416   RDW 13.0 12/25/2011 1416   LYMPHSABS 3.7 12/25/2011 1416   MONOABS 1.1* 12/25/2011 1416   EOSABS 0.1 12/25/2011 1416   BASOSABS 0.0 12/25/2011 1416    Hgb A1C Lab Results  Component Value Date   HGBA1C  Value: 5.4 (NOTE) The ADA recommends the following therapeutic goal for glycemic control related to Hgb A1c measurement: Goal of therapy: <6.5 Hgb A1c  Reference: American Diabetes Association: Clinical Practice Recommendations 2010, Diabetes Care, 2010, 33: (Suppl  1). 12/07/2008        Assessment & Plan:   Lumbar radiculopathy of both legs, recurrent:  Will obtain MRI of lumbar spine per Dr. Debby Bud recommendation- see note from 04/2012 80 mg Depo IM now eRx for pred taper, flexeril and vicodin   Will f/u after MRI, if worse ER

## 2012-11-21 NOTE — Addendum Note (Signed)
Addended by: Edwena Felty T on: 11/21/2012 03:05 PM   Modules accepted: Orders

## 2012-11-21 NOTE — Patient Instructions (Signed)
Sciatica °Sciatica is pain, weakness, numbness, or tingling along the path of the sciatic nerve. The nerve starts in the lower back and runs down the back of each leg. The nerve controls the muscles in the lower leg and in the back of the knee, while also providing sensation to the back of the thigh, lower leg, and the sole of your foot. Sciatica is a symptom of another medical condition. For instance, nerve damage or certain conditions, such as a herniated disk or bone spur on the spine, pinch or put pressure on the sciatic nerve. This causes the pain, weakness, or other sensations normally associated with sciatica. Generally, sciatica only affects one side of the body. °CAUSES  °· Herniated or slipped disc. °· Degenerative disk disease. °· A pain disorder involving the narrow muscle in the buttocks (piriformis syndrome). °· Pelvic injury or fracture. °· Pregnancy. °· Tumor (rare). °SYMPTOMS  °Symptoms can vary from mild to very severe. The symptoms usually travel from the low back to the buttocks and down the back of the leg. Symptoms can include: °· Mild tingling or dull aches in the lower back, leg, or hip. °· Numbness in the back of the calf or sole of the foot. °· Burning sensations in the lower back, leg, or hip. °· Sharp pains in the lower back, leg, or hip. °· Leg weakness. °· Severe back pain inhibiting movement. °These symptoms may get worse with coughing, sneezing, laughing, or prolonged sitting or standing. Also, being overweight may worsen symptoms. °DIAGNOSIS  °Your caregiver will perform a physical exam to look for common symptoms of sciatica. He or she may ask you to do certain movements or activities that would trigger sciatic nerve pain. Other tests may be performed to find the cause of the sciatica. These may include: °· Blood tests. °· X-rays. °· Imaging tests, such as an MRI or CT scan. °TREATMENT  °Treatment is directed at the cause of the sciatic pain. Sometimes, treatment is not necessary  and the pain and discomfort goes away on its own. If treatment is needed, your caregiver may suggest: °· Over-the-counter medicines to relieve pain. °· Prescription medicines, such as anti-inflammatory medicine, muscle relaxants, or narcotics. °· Applying heat or ice to the painful area. °· Steroid injections to lessen pain, irritation, and inflammation around the nerve. °· Reducing activity during periods of pain. °· Exercising and stretching to strengthen your abdomen and improve flexibility of your spine. Your caregiver may suggest losing weight if the extra weight makes the back pain worse. °· Physical therapy. °· Surgery to eliminate what is pressing or pinching the nerve, such as a bone spur or part of a herniated disk. °HOME CARE INSTRUCTIONS  °· Only take over-the-counter or prescription medicines for pain or discomfort as directed by your caregiver. °· Apply ice to the affected area for 20 minutes, 3 4 times a day for the first 48 72 hours. Then try heat in the same way. °· Exercise, stretch, or perform your usual activities if these do not aggravate your pain. °· Attend physical therapy sessions as directed by your caregiver. °· Keep all follow-up appointments as directed by your caregiver. °· Do not wear high heels or shoes that do not provide proper support. °· Check your mattress to see if it is too soft. A firm mattress may lessen your pain and discomfort. °SEEK IMMEDIATE MEDICAL CARE IF:  °· You lose control of your bowel or bladder (incontinence). °· You have increasing weakness in the lower back,   pelvis, buttocks, or legs. °· You have redness or swelling of your back. °· You have a burning sensation when you urinate. °· You have pain that gets worse when you lie down or awakens you at night. °· Your pain is worse than you have experienced in the past. °· Your pain is lasting longer than 4 weeks. °· You are suddenly losing weight without reason. °MAKE SURE YOU: °· Understand these  instructions. °· Will watch your condition. °· Will get help right away if you are not doing well or get worse. °Document Released: 04/04/2001 Document Revised: 10/10/2011 Document Reviewed: 08/20/2011 °[employer]® Patient Information ©2014 [employer]. ° °

## 2012-11-26 ENCOUNTER — Inpatient Hospital Stay: Admission: RE | Admit: 2012-11-26 | Payer: BC Managed Care – PPO | Source: Ambulatory Visit

## 2012-12-01 ENCOUNTER — Other Ambulatory Visit: Payer: BC Managed Care – PPO

## 2012-12-04 ENCOUNTER — Encounter: Payer: Self-pay | Admitting: Internal Medicine

## 2012-12-04 ENCOUNTER — Ambulatory Visit (INDEPENDENT_AMBULATORY_CARE_PROVIDER_SITE_OTHER): Payer: BC Managed Care – PPO | Admitting: Internal Medicine

## 2012-12-04 VITALS — BP 142/90 | HR 106 | Temp 97.8°F | Wt 252.0 lb

## 2012-12-04 DIAGNOSIS — M5416 Radiculopathy, lumbar region: Secondary | ICD-10-CM

## 2012-12-04 DIAGNOSIS — M79604 Pain in right leg: Secondary | ICD-10-CM

## 2012-12-04 DIAGNOSIS — R350 Frequency of micturition: Secondary | ICD-10-CM

## 2012-12-04 DIAGNOSIS — IMO0002 Reserved for concepts with insufficient information to code with codable children: Secondary | ICD-10-CM

## 2012-12-04 DIAGNOSIS — M545 Low back pain, unspecified: Secondary | ICD-10-CM

## 2012-12-04 MED ORDER — NABUMETONE 750 MG PO TABS: 1500.0000 mg | ORAL_TABLET | Freq: Every day | ORAL | Status: DC

## 2012-12-04 MED ORDER — CYCLOBENZAPRINE HCL 10 MG PO TABS: 10.0000 mg | ORAL_TABLET | Freq: Three times a day (TID) | ORAL | Status: DC | PRN

## 2012-12-04 NOTE — Assessment & Plan Note (Signed)
Patient with recurrent low back pain, somewhat different that Jan '14 presentation with some radiation to both legs. Exam without radicular findings favoring low back strain vs serious DDD.  Plan Cancel MRI  Refer for PT  Change NSAID to nabumatone 1500 mg qHS  Heat  Massage

## 2012-12-04 NOTE — Progress Notes (Signed)
Subjective:    Patient ID: [name], male    DOB: 07/27/1960, 52 y.o.   MRN: [id number]  HPI [name] presents persistent back pain. He has a history of back pain: seen in January '14 17th and 28th. He had normal L-S spine films except for DJD. Normal disk space. He was seen July 31st for recurrent back pain after lifting a lawnmower out of the trunk. He has not had a good response to to steroids or pain meds with continued pain. He describes pain down both legs and constant pressure in the low back and feeling of need to micturate.  PMH, FamHx and SocHx reviewed for any changes and relevance. Current Outpatient Prescriptions on File Prior to Visit  Medication Sig Dispense Refill  . amitriptyline (ELAVIL) 25 MG tablet Take 50 mg by mouth daily.      Marland Kitchen aspirin 81 MG EC tablet Take 81 mg by mouth daily.        . cyclobenzaprine (FLEXERIL) 10 MG tablet Take 1 tablet (10 mg total) by mouth 3 (three) times daily as needed for muscle spasms.  30 tablet  0  . diltiazem (TIAZAC) 240 MG 24 hr capsule Take 1 capsule (240 mg total) by mouth daily.  30 capsule  11  . doxycycline (VIBRAMYCIN) 100 MG capsule Take 1 capsule (100 mg total) by mouth 2 (two) times daily.  20 capsule  0  . ibuprofen (ADVIL) 200 MG tablet Take 1 tablet (200 mg total) by mouth every 6 (six) hours as needed for pain.  30 tablet  0  . lansoprazole (PREVACID) 30 MG capsule TAKE 1 CAPSULE (30 MG TOTAL) BY MOUTH DAILY. TAKE TWICE A DAY UNTIL OFFICE FOLLOW UP WEDNESDAY AUGUST 28  30 capsule  5  . levothyroxine (SYNTHROID, LEVOTHROID) 25 MCG tablet TAKE 1 TABLET (25 MCG TOTAL) BY MOUTH DAILY.  30 tablet  3  . LORazepam (ATIVAN) 1 MG tablet One tablet up to 3 times daily for nausea  15 tablet  0  . pantoprazole (PROTONIX) 40 MG tablet TAKE 1 TABLET (40 MG TOTAL) BY MOUTH DAILY.  90 tablet  2  . VYTORIN 10-80 MG per tablet TAKE ONE TABLET BY MOUTH DAILY  30 tablet  11   No current facility-administered medications on file prior  to visit.      Review of Systems System review is negative for any constitutional, cardiac, pulmonary, GI or neuro symptoms or complaints other than as described in the HPI.     Objective:   Physical Exam Filed Vitals:   12/04/12 1043  BP: 142/90  Pulse: 106  Temp: 97.8 F (36.6 C)   Wt Readings from Last 3 Encounters:  12/04/12 252 lb (114.306 kg)  11/21/12 242 lb (109.77 kg)  09/26/12 240 lb 6 oz (109.033 kg)   Gen'l - heavy set man in no acute distress Cor - RRR Pulm - normal respirations.  Back exam: normal stand; normal flex to greater than 90 degrees; normal gait; normal toe/heel walk although with some pain with toe walk; normal step up to exam table; normal SLR sitting; normal DTRs at the patellar tendons; normal sensation to light touch, pin-prick and deep vibratory stimulus; no  CVA tenderness; able to move supine to sitting witout assistance.        Assessment & Plan:  Urinary frequency - . Sense of urinary frequency/urgency - most likely related to enlargement of the Prostate and not neurologic in origin. A secondary diagnosis can by irritable bladder  or Overactive Bladder. Plan Trial of Rapaflo for the prostate - take once a day. You should see results in 1-2 days.  If no results let me know and we can try a product for OAB

## 2012-12-04 NOTE — Patient Instructions (Addendum)
1. Low back pain - on exam there is no evidence of a compromised nerve root, no need for surgery or steroid injection. This is most likely pulled muscle +/- degenerative joint disease Plan Nabumetone 1500 mg once a day at bedtime  Flexeril for muscle spasm  Heat  PT  Massage therapy may be very helpful  2. Sense of urinary frequency/urgency - most likely related to enlargement of the Prostate and not neurologic in origin. A secondary diagnosis can by irritable bladder or Overactive Bladder. Plan Trial of Rapaflo for the prostate - take once a day. You should see results in 1-2 days.  If no results let me know and we can try a product for OAB  Lumbosacral Strain Lumbosacral strain is one of the most common causes of back pain. There are many causes of back pain. Most are not serious conditions. CAUSES  Your backbone (spinal column) is made up of 24 main vertebral bodies, the sacrum, and the coccyx. These are held together by muscles and tough, fibrous tissue (ligaments). Nerve roots pass through the openings between the vertebrae. A sudden move or injury to the back may cause injury to, or pressure on, these nerves. This may result in localized back pain or pain movement (radiation) into the buttocks, down the leg, and into the foot. Sharp, shooting pain from the buttock down the back of the leg (sciatica) is frequently associated with a ruptured (herniated) disk. Pain may be caused by muscle spasm alone. Your caregiver can often find the cause of your pain by the details of your symptoms and an exam. In some cases, you may need tests (such as X-rays). Your caregiver will work with you to decide if any tests are needed based on your specific exam. HOME CARE INSTRUCTIONS   Avoid an underactive lifestyle. Active exercise, as directed by your caregiver, is your greatest weapon against back pain.  Avoid hard physical activities (tennis, racquetball, waterskiing) if you are not in proper physical  condition for it. This may aggravate or create problems.  If you have a back problem, avoid sports requiring sudden body movements. Swimming and walking are generally safer activities.  Maintain good posture.  Avoid becoming overweight (obese).  Use bed rest for only the most extreme, sudden (acute) episode. Your caregiver will help you determine how much bed rest is necessary.  For acute conditions, you may put ice on the injured area.  Put ice in a plastic bag.  Place a towel between your skin and the bag.  Leave the ice on for 15-20 minutes at a time, every 2 hours, or as needed.  After you are improved and more active, it may help to apply heat for 30 minutes before activities. See your caregiver if you are having pain that lasts longer than expected. Your caregiver can advise appropriate exercises or therapy if needed. With conditioning, most back problems can be avoided. SEEK IMMEDIATE MEDICAL CARE IF:   You have numbness, tingling, weakness, or problems with the use of your arms or legs.  You experience severe back pain not relieved with medicines.  There is a change in bowel or bladder control.  You have increasing pain in any area of the body, including your belly (abdomen).  You notice shortness of breath, dizziness, or feel faint.  You feel sick to your stomach (nauseous), are throwing up (vomiting), or become sweaty.  You notice discoloration of your toes or legs, or your feet get very cold.  Your back pain  is getting worse.  You have a fever. MAKE SURE YOU:   Understand these instructions.  Will watch your condition.  Will get help right away if you are not doing well or get worse. Document Released: 01/18/2005 Document Revised: 07/03/2011 Document Reviewed: 07/10/2008 [facility] Patient Information 2014 [place], [place].    Benign Prostatic Hypertrophy  The prostate gland is part of the reproductive system of men. A normal prostate is about the size and  shape of a walnut. The prostate gland makes a fluid that is mixed with sperm to make semen. This gland surrounds the urethra and is located in front of the rectum and just below the bladder. The bladder is where urine is stored. The urethra is the tube through which urine passes from the bladder to get out of the body. The prostate grows as a man ages. An enlarged prostate not caused by cancer is called benign prostatic hypertrophy (BPH). This is a common health problem in men over age 58. This condition is a normal part of aging. An enlarged prostate presses on the urethra. This makes it harder to pass urine. In the early stages of enlargement, the bladder can get by with a narrowed urethra by forcing the urine through. If the problem gets worse, medical or surgical treatment may be required.  This condition should be followed by your caregiver. Longstanding back pressure on the kidneys can cause infection. Back pressure and infection can progress to bladder damage and kidney (renal) failure. If needed, your caregiver may refer you to a specialist in kidney and prostate disease (urologist). CAUSES  The exact cause is not known.  SYMPTOMS   You are not able to completely empty your bladder.  Getting up often during the night to urinate.  Need to urinate frequently during the day.  Difficultly in starting urine flow.  Decrease in size and strength of the urine stream.  Dribbling after urination.  Pain on urination (more common with infection).  Inability to pass your water. This needs immediate treatment. DIAGNOSIS  These tests will help your caregiver understand your problem:  Digital rectal exam (DRE). In a rectal exam, your caregiver checks your prostate by putting a gloved, lubricated finger into the rectum to feel the back of your prostate gland. This exam detects the size of the gland and abnormal lumps or growths.  Urinalysis (exam of the urine). This may include a culture if there  is concern about infection.  Prostate Specific Antigen (PSA). This is a blood test used to screen for prostate cancer. It is not used alone for diagnosing prostate cancer.  Rectal ultrasound (sonogram). This test uses sound waves to electronically produce a "picture" of the prostate. It helps examine the prostate gland for cancer. TREATMENT  Mild symptoms may not need treatment. Simple observation and yearly exams may be all that is required. Medications and surgery are options for more severe problems. Your caregiver can help you make an informed decision for what is best. Two classes of medications are available for relief of prostate symptoms:  Medications that shrink the prostate. This helps relieve symptoms.  Uncommon side effects include problems with sexual function.  Medications to relax the muscle of the prostate. This also relieves the obstruction.  Side effects can include dizziness, fatigue, lightheadedness, and retrograde ejaculation (diminished volume of ejaculate). Several types of surgical treatments are available for relief of prostate symptoms:  Transurethral resection of the prostate (TURP). In this treatment, an instrument is inserted through opening at the  tip of the penis. It is used to cut away pieces of the inner core of the prostate. The pieces are removed through the same opening of the penis. This removes the obstruction and helps get rid of the symptoms.  Transurethral incision (TUIP). In this procedure, small cuts are made in the prostate. This lessens the prostates pressure on the urethra.  Transurethral microwave thermotherapy (TUMT). This procedure uses microwaves to create heat. The heat destroys and removes a small amount of prostate tissue.  Transurethral needle ablation (TUNA). This is a procedure that uses radio frequencies to do the same as TUMT.  Interstitial laser coagulation (ILC). This is a procedure that uses a laser to do the same as TUMT and  TUNA.  Transurethral electrovaporization (TUVP). This is a procedure that uses electrodes to do the same as the procedures listed above. Regardless of the method of treatment chosen, you and your caregiver will discuss the options. With this knowledge, you along with your caregiver can decide upon the best treatment for you. SEEK MEDICAL CARE IF:   You develop chills, fever of 100.5 F (38.1 C), or night sweats.  There is unexplained back pain.  Symptoms are not helped by medications prescribed.  You develop medication side effects.  Your urine becomes very dark or has a bad smell. SEEK IMMEDIATE MEDICAL CARE IF:   You are suddenly unable to urinate. This is an emergency. You should be seen immediately.  There are large amounts of blood or clots in the urine.  Your urinary problems become unmanageable.  You develop lightheadedness, severe dizziness, or you feel faint.  You develop moderate to severe low back or flank pain.  You develop chills or fever. Document Released: 04/10/2005 Document Revised: 07/03/2011 Document Reviewed: 12/31/2006 [facility] Patient Information 2014 [place], [place].

## 2012-12-11 ENCOUNTER — Ambulatory Visit: Payer: BC Managed Care – PPO | Attending: Internal Medicine | Admitting: Rehabilitation

## 2012-12-11 DIAGNOSIS — M545 Low back pain, unspecified: Secondary | ICD-10-CM | POA: Insufficient documentation

## 2012-12-11 DIAGNOSIS — IMO0001 Reserved for inherently not codable concepts without codable children: Secondary | ICD-10-CM | POA: Insufficient documentation

## 2012-12-11 DIAGNOSIS — M6281 Muscle weakness (generalized): Secondary | ICD-10-CM | POA: Insufficient documentation

## 2012-12-11 DIAGNOSIS — M25659 Stiffness of unspecified hip, not elsewhere classified: Secondary | ICD-10-CM | POA: Insufficient documentation

## 2012-12-11 DIAGNOSIS — R293 Abnormal posture: Secondary | ICD-10-CM | POA: Insufficient documentation

## 2012-12-18 ENCOUNTER — Ambulatory Visit: Payer: BC Managed Care – PPO | Admitting: Physical Therapy

## 2012-12-24 ENCOUNTER — Ambulatory Visit: Payer: BC Managed Care – PPO | Attending: Internal Medicine | Admitting: Physical Therapy

## 2012-12-24 DIAGNOSIS — M545 Low back pain, unspecified: Secondary | ICD-10-CM | POA: Insufficient documentation

## 2012-12-24 DIAGNOSIS — IMO0001 Reserved for inherently not codable concepts without codable children: Secondary | ICD-10-CM | POA: Insufficient documentation

## 2012-12-24 DIAGNOSIS — M25659 Stiffness of unspecified hip, not elsewhere classified: Secondary | ICD-10-CM | POA: Insufficient documentation

## 2012-12-24 DIAGNOSIS — R293 Abnormal posture: Secondary | ICD-10-CM | POA: Insufficient documentation

## 2012-12-24 DIAGNOSIS — M6281 Muscle weakness (generalized): Secondary | ICD-10-CM | POA: Insufficient documentation

## 2012-12-26 ENCOUNTER — Ambulatory Visit: Payer: BC Managed Care – PPO | Admitting: Rehabilitation

## 2013-02-04 ENCOUNTER — Other Ambulatory Visit (HOSPITAL_COMMUNITY): Payer: Self-pay | Admitting: Internal Medicine

## 2013-02-22 ENCOUNTER — Other Ambulatory Visit: Payer: Self-pay | Admitting: Internal Medicine

## 2013-05-12 ENCOUNTER — Emergency Department (HOSPITAL_BASED_OUTPATIENT_CLINIC_OR_DEPARTMENT_OTHER)
Admission: EM | Admit: 2013-05-12 | Discharge: 2013-05-12 | Disposition: A | Discharge status: Home / Self Care | Payer: BC Managed Care – PPO | Attending: Emergency Medicine | Admitting: Emergency Medicine

## 2013-05-12 ENCOUNTER — Encounter (HOSPITAL_BASED_OUTPATIENT_CLINIC_OR_DEPARTMENT_OTHER): Payer: Self-pay | Admitting: Emergency Medicine

## 2013-05-12 DIAGNOSIS — Z8669 Personal history of other diseases of the nervous system and sense organs: Secondary | ICD-10-CM | POA: Insufficient documentation

## 2013-05-12 DIAGNOSIS — K219 Gastro-esophageal reflux disease without esophagitis: Secondary | ICD-10-CM | POA: Insufficient documentation

## 2013-05-12 DIAGNOSIS — Z8619 Personal history of other infectious and parasitic diseases: Secondary | ICD-10-CM | POA: Insufficient documentation

## 2013-05-12 DIAGNOSIS — F17200 Nicotine dependence, unspecified, uncomplicated: Secondary | ICD-10-CM | POA: Insufficient documentation

## 2013-05-12 DIAGNOSIS — J45909 Unspecified asthma, uncomplicated: Secondary | ICD-10-CM | POA: Insufficient documentation

## 2013-05-12 DIAGNOSIS — R197 Diarrhea, unspecified: Secondary | ICD-10-CM

## 2013-05-12 DIAGNOSIS — Z79899 Other long term (current) drug therapy: Secondary | ICD-10-CM | POA: Insufficient documentation

## 2013-05-12 DIAGNOSIS — E042 Nontoxic multinodular goiter: Secondary | ICD-10-CM | POA: Insufficient documentation

## 2013-05-12 DIAGNOSIS — Z791 Long term (current) use of non-steroidal anti-inflammatories (NSAID): Secondary | ICD-10-CM | POA: Insufficient documentation

## 2013-05-12 DIAGNOSIS — Z7982 Long term (current) use of aspirin: Secondary | ICD-10-CM | POA: Insufficient documentation

## 2013-05-12 DIAGNOSIS — E785 Hyperlipidemia, unspecified: Secondary | ICD-10-CM | POA: Insufficient documentation

## 2013-05-12 DIAGNOSIS — I10 Essential (primary) hypertension: Secondary | ICD-10-CM | POA: Insufficient documentation

## 2013-05-12 DIAGNOSIS — R1110 Vomiting, unspecified: Secondary | ICD-10-CM | POA: Insufficient documentation

## 2013-05-12 LAB — COMPREHENSIVE METABOLIC PANEL
ALT: 20 U/L (ref 0–53)
AST: 18 U/L (ref 0–37)
Albumin: 4.6 g/dL (ref 3.5–5.2)
Alkaline Phosphatase: 79 U/L (ref 39–117)
BUN: 16 mg/dL (ref 6–23)
CO2: 19 mEq/L (ref 19–32)
Calcium: 10.7 mg/dL — ABNORMAL HIGH (ref 8.4–10.5)
Chloride: 99 mEq/L (ref 96–112)
Creatinine, Ser: 1.1 mg/dL (ref 0.50–1.35)
GFR calc Af Amer: 89 mL/min — ABNORMAL LOW (ref >90)
GFR calc non Af Amer: 77 mL/min — ABNORMAL LOW (ref >90)
Glucose, Bld: 189 mg/dL — ABNORMAL HIGH (ref 70–99)
Potassium: 3.6 mEq/L — ABNORMAL LOW (ref 3.7–5.3)
Sodium: 141 mEq/L (ref 137–147)
Total Bilirubin: 0.4 mg/dL (ref 0.3–1.2)
Total Protein: 9.4 g/dL — ABNORMAL HIGH (ref 6.0–8.3)

## 2013-05-12 LAB — URINE MICROSCOPIC-ADD ON

## 2013-05-12 LAB — URINALYSIS, ROUTINE W REFLEX MICROSCOPIC
Bilirubin Urine: NEGATIVE
Glucose, UA: NEGATIVE mg/dL
Ketones, ur: 15 mg/dL — AB
Leukocytes, UA: NEGATIVE
Nitrite: NEGATIVE
Protein, ur: NEGATIVE mg/dL
Specific Gravity, Urine: 1.021 (ref 1.005–1.030)
Urobilinogen, UA: 0.2 mg/dL (ref 0.0–1.0)
pH: 6 (ref 5.0–8.0)

## 2013-05-12 LAB — CBC WITH DIFFERENTIAL/PLATELET
Basophils Absolute: 0 10*3/uL (ref 0.0–0.1)
Basophils Relative: 0 % (ref 0–1)
Eosinophils Absolute: 0 10*3/uL (ref 0.0–0.7)
Eosinophils Relative: 0 % (ref 0–5)
HCT: 49 % (ref 39.0–52.0)
Hemoglobin: 17.4 g/dL — ABNORMAL HIGH (ref 13.0–17.0)
Lymphocytes Relative: 16 % (ref 12–46)
Lymphs Abs: 2.5 10*3/uL (ref 0.7–4.0)
MCH: 30.7 pg (ref 26.0–34.0)
MCHC: 35.5 g/dL (ref 30.0–36.0)
MCV: 86.4 fL (ref 78.0–100.0)
Monocytes Absolute: 0.6 10*3/uL (ref 0.1–1.0)
Monocytes Relative: 4 % (ref 3–12)
Neutro Abs: 12.4 10*3/uL — ABNORMAL HIGH (ref 1.7–7.7)
Neutrophils Relative %: 80 % — ABNORMAL HIGH (ref 43–77)
Platelets: 225 10*3/uL (ref 150–400)
RBC: 5.67 MIL/uL (ref 4.22–5.81)
RDW: 13 % (ref 11.5–15.5)
WBC: 15.6 10*3/uL — ABNORMAL HIGH (ref 4.0–10.5)

## 2013-05-12 LAB — LIPASE, BLOOD: Lipase: 21 U/L (ref 11–59)

## 2013-05-12 MED ORDER — PROMETHAZINE HCL 25 MG/ML IJ SOLN
25.0000 mg | Freq: Once | INTRAMUSCULAR | Status: AC
  Administered 2013-05-12: 25 mg via INTRAVENOUS
  Filled 2013-05-12: qty 1

## 2013-05-12 MED ORDER — SODIUM CHLORIDE 0.9 % IV BOLUS (SEPSIS)
1000.0000 mL | Freq: Once | INTRAVENOUS | Status: AC
  Administered 2013-05-12: 1000 mL via INTRAVENOUS

## 2013-05-12 MED ORDER — ONDANSETRON HCL 4 MG/2ML IJ SOLN
4.0000 mg | Freq: Once | INTRAMUSCULAR | Status: AC
  Administered 2013-05-12: 4 mg via INTRAVENOUS
  Filled 2013-05-12: qty 2

## 2013-05-12 MED ORDER — MORPHINE SULFATE 4 MG/ML IJ SOLN
4.0000 mg | Freq: Once | INTRAMUSCULAR | Status: AC
  Administered 2013-05-12: 4 mg via INTRAVENOUS
  Filled 2013-05-12: qty 1

## 2013-05-12 MED ORDER — ONDANSETRON 4 MG PO TBDP: 4.0000 mg | ORAL_TABLET | Freq: Three times a day (TID) | ORAL | Status: DC | PRN

## 2013-05-12 MED ORDER — PROMETHAZINE HCL 25 MG PO TABS: 25.0000 mg | ORAL_TABLET | Freq: Four times a day (QID) | ORAL | Status: DC | PRN

## 2013-05-12 MED ORDER — OXYCODONE-ACETAMINOPHEN 5-325 MG PO TABS: 1.0000 | ORAL_TABLET | ORAL | Status: DC | PRN

## 2013-05-12 NOTE — ED Provider Notes (Signed)
CSN: [id number]     Arrival date & time 05/12/13  1343 History   First MD Initiated Contact with Patient 05/12/13 1356     Chief Complaint  Patient presents with  . Abdominal Pain   (Consider location/radiation/quality/duration/timing/severity/associated sxs/prior Treatment) HPI Comments: Pt states that he started with generalized abdominal pain since yesterday:pt states that he is also having vomiting and diarrhea:pt states that he has similar symptoms and had to be hospitalized:no fever:pt states that he is hurting all over  The history is provided by the patient. No language interpreter was used.    Past Medical History  Diagnosis Date  . Hypertension   . Unspecified intestinal obstruction   . Unspecified asthma(493.90)   . Chest pain, atypical   . Nontoxic multinodular goiter   . Painful respiration   . Dyspnea on exertion   . Trigeminal neuralgia   . Cluster headache   . Hyperlipidemia   . GERD (gastroesophageal reflux disease)   . Complication of anesthesia     difficult waking up"  . Food poisoning 11/2011  . Multiple thyroid nodules    Past Surgical History  Procedure Laterality Date  . Appendectomy    . Knee arthroscopy    . 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 Right shoulder redo-reconstruction  06/21/10  . Destruction trigeminal nerve via neurolytic agent     Family History  Problem Relation Age of Onset  . Diabetes Mother   . Hyperlipidemia Mother   . Hypertension Mother   . Hypertension Father   . Heart disease Father     CAD/CHF  . Cancer Father     Mesothelioma  . Heart disease Maternal Aunt   . Prostate cancer Neg Hx   . Colon cancer Neg Hx    History  Substance Use Topics  . Smoking status: Current Every Day Smoker -- 0.25 packs/day for 18 years    Types: Cigarettes  . Smokeless tobacco: Never Used     Comment: 1-2 cigs per day, started age 71  . Alcohol Use: No     Comment: rare    Review of Systems   Constitutional: Negative.   Respiratory: Negative.   Cardiovascular: Negative.     Allergies  Review of patient's allergies indicates no known allergies.  Home Medications   Current Outpatient Rx  Name  Route  Sig  Dispense  Refill  . aspirin 81 MG EC tablet   Oral   Take 81 mg by mouth daily.           . cyclobenzaprine (FLEXERIL) 10 MG tablet   Oral   Take 1 tablet (10 mg total) by mouth 3 (three) times daily as needed for muscle spasms.   90 tablet   2   . diltiazem (TIAZAC) 240 MG 24 hr capsule   Oral   Take 1 capsule (240 mg total) by mouth daily.   30 capsule   11   . lansoprazole (PREVACID) 30 MG capsule      TAKE 1 CAPSULE (30 MG TOTAL) BY MOUTH DAILY. TAKE TWICE A DAY UNTIL OFFICE FOLLOW UP WEDNESDAY AUGUST 28   30 capsule   4     CYCLE FILL MEDICATION. Authorization is required f ...   . levothyroxine (SYNTHROID, LEVOTHROID) 25 MCG tablet      TAKE 1 TABLET (25 MCG TOTAL) BY MOUTH DAILY.   30 tablet   3     CYCLE FILL MEDICATION. Authorization  is required f ...   . nabumetone (RELAFEN) 750 MG tablet   Oral   Take 2 tablets (1,500 mg total) by mouth at bedtime.   60 tablet   5   . pantoprazole (PROTONIX) 40 MG tablet      TAKE 1 TABLET (40 MG TOTAL) BY MOUTH DAILY.   90 tablet   1     CYCLE FILL MEDICATION. Authorization is required f ...   . Terbinafine HCl (LAMISIL PO)   Oral   Take 1 tablet by mouth daily.         Marland Kitchen VYTORIN 10-80 MG per tablet      TAKE ONE TABLET BY MOUTH DAILY   30 tablet   11     CYCLE FILL MEDICATION. Authorization is required f ...    BP 142/98  Pulse 66  Temp(Src) 97.8 F (36.6 C) (Oral)  Resp 18  Ht 5' 10.5" (1.791 m)  Wt 252 lb (114.306 kg)  BMI 35.64 kg/m2  SpO2 95% Physical Exam  Nursing note and vitals reviewed. Constitutional: He is oriented to person, place, and time. He appears well-developed and well-nourished.  HENT:  Head: Normocephalic and atraumatic.  Cardiovascular: Normal rate  and regular rhythm.   Pulmonary/Chest: Effort normal and breath sounds normal.  Abdominal: Soft. Bowel sounds are normal.  Diffuse tenderness  Musculoskeletal: Normal range of motion.  Neurological: He is alert and oriented to person, place, and time.  Skin: Skin is warm and dry.  Psychiatric: He has a normal mood and affect.    ED Course  Procedures (including critical care time) Labs Review Labs Reviewed  CBC WITH DIFFERENTIAL - Abnormal; Notable for the following:    WBC 15.6 (*)    Hemoglobin 17.4 (*)    Neutrophils Relative % 80 (*)    Neutro Abs 12.4 (*)    All other components within normal limits  COMPREHENSIVE METABOLIC PANEL - Abnormal; Notable for the following:    Potassium 3.6 (*)    Glucose, Bld 189 (*)    Calcium 10.7 (*)    Total Protein 9.4 (*)    GFR calc non Af Amer 77 (*)    GFR calc Af Amer 89 (*)    All other components within normal limits  LIPASE, BLOOD  URINALYSIS, ROUTINE W REFLEX MICROSCOPIC   Imaging Review No results found.  EKG Interpretation   None       MDM   1. Vomiting and diarrhea     Abdomen not acute on exam:pt not vomiting here:pt tolerating fluids here:no dehydration noted:will send home with zofran phenergan and oxycodone    Glendell Docker, NP 05/12/13 1730

## 2013-05-12 NOTE — ED Notes (Signed)
Abdominal pain since yesterday. Vomiting yellow gastric contents. Diarrhea.

## 2013-05-12 NOTE — Discharge Instructions (Signed)

## 2013-05-14 NOTE — ED Provider Notes (Signed)
Medical screening examination/treatment/procedure(s) were performed by non-physician practitioner and as supervising physician I was immediately available for consultation/collaboration.  EKG Interpretation   None        Orlie Dakin, MD 05/14/13 [phone]

## 2013-07-06 ENCOUNTER — Other Ambulatory Visit (HOSPITAL_COMMUNITY): Payer: Self-pay | Admitting: Internal Medicine

## 2013-07-10 ENCOUNTER — Other Ambulatory Visit (INDEPENDENT_AMBULATORY_CARE_PROVIDER_SITE_OTHER): Payer: BC Managed Care – PPO

## 2013-07-10 ENCOUNTER — Ambulatory Visit (INDEPENDENT_AMBULATORY_CARE_PROVIDER_SITE_OTHER): Payer: BC Managed Care – PPO | Admitting: Internal Medicine

## 2013-07-10 ENCOUNTER — Encounter: Payer: Self-pay | Admitting: Internal Medicine

## 2013-07-10 VITALS — BP 160/102 | HR 99 | Temp 97.2°F | Resp 16 | Wt 259.4 lb

## 2013-07-10 DIAGNOSIS — K219 Gastro-esophageal reflux disease without esophagitis: Secondary | ICD-10-CM

## 2013-07-10 DIAGNOSIS — R7309 Other abnormal glucose: Secondary | ICD-10-CM

## 2013-07-10 DIAGNOSIS — R350 Frequency of micturition: Secondary | ICD-10-CM

## 2013-07-10 DIAGNOSIS — I10 Essential (primary) hypertension: Secondary | ICD-10-CM

## 2013-07-10 DIAGNOSIS — R1319 Other dysphagia: Secondary | ICD-10-CM

## 2013-07-10 DIAGNOSIS — E161 Other hypoglycemia: Secondary | ICD-10-CM

## 2013-07-10 DIAGNOSIS — E162 Hypoglycemia, unspecified: Secondary | ICD-10-CM

## 2013-07-10 LAB — URINALYSIS
Bilirubin Urine: NEGATIVE
Hgb urine dipstick: NEGATIVE
Ketones, ur: NEGATIVE
Leukocytes, UA: NEGATIVE
Nitrite: NEGATIVE
Specific Gravity, Urine: 1.015 (ref 1.000–1.030)
Total Protein, Urine: NEGATIVE
Urine Glucose: NEGATIVE
Urobilinogen, UA: 0.2 (ref 0.0–1.0)
pH: 6 (ref 5.0–8.0)

## 2013-07-10 NOTE — Progress Notes (Signed)
   Subjective:    Patient ID: [name], male    DOB: 28-Jun-1960, 53 y.o.   MRN: [id number]  HPI  For the last 2-3 months he's had episodes on average 2-3 times per week manifested as feeling shaky and lightheaded with associated fatigue and cold sweats. He also has alternating hot and cold chills. These typically occurred 5-10 minutes after completing a meal. He did feel that fruit snacks was one specific trigger  Unrelated is frequent urination particularly when he travels long distances  Recent labs in January revealed a random glucose of 189. His last A1c on record was 5.4% in 2010. In January he also had a calcium of 10.7. His prior calcium level was 10.8  There is a strong maternal family history of diabetes.  He presently smokes half a pack per day.    Review of Systems   He has not been taking his BP medication regularly. "I thought it might be causing stomach symptoms" of excess mucus. He has significant  Dyspepsia despite PPI. He has dysphagia intermittently, 4X/week. No unexplained weight loss, abdominal pain, melena, rectal bleeding, or small caliber stools.  Change in stools such as diarrhea or constipation denied  Dysuria, pyuria, hematuria or flank pain are absent  No associated back pain with radiation anteriorly. No associated rash or skin lesions present Abnormal bruising or bleeding not present     Objective:   Physical Exam General appearance :adequate  Nourishment; weight excess; w/o distress.  Eyes: No conjunctival inflammation or scleral icterus is present.  Oral exam: Dental hygiene is good; lips and gums are healthy appearing.There is moderate oropharyngeal erythema ;no exudate noted.   Heart:  Normal rate and regular rhythm. S1 and S2 normal without gallop, murmur, click, rub or other extra sounds . S4    Lungs:Chest clear to auscultation; no wheezes, rhonchi,rales ,or rubs present.No increased work of breathing.   Abdomen: bowel sounds  normal, soft and non-tender without masses, or organomegaly .Umbilical hernia noted.  No guarding or rebound . No tenderness over the flanks to percussion  Musculoskeletal: Able to lie flat and sit up without help. Negative straight leg raising bilaterally. Gait normal  Skin:Warm & dry.  Intact without suspicious lesions or rashes ; no jaundice   Lymphatic: No lymphadenopathy is noted about the head, neck, axilla.  Non focused historian              Assessment & Plan:  #1 postprandial symptoms which suggest reactive hypoglycemia  #2 abnormal glucose with a strong family history of diabetes  #3 hypercalcemia documented twice  #4 dysphagia and dyspepsia despite PPI #5 HTN with non compliance  Plan see orders

## 2013-07-10 NOTE — Progress Notes (Signed)
Pre visit review using our clinic review tool, if applicable. No additional management support is needed unless otherwise documented below in the visit note. 

## 2013-07-10 NOTE — Patient Instructions (Addendum)
Your symptoms could indicate "reactive hypoglycemia", low sugars due to the pancreas excreting excess insulin in response to glucose elevations from " hyperglycemic carbs"  in diet. Eat a low-fat diet with lots of fruits and vegetables, up to 7-9 servings per day. Consume less than 40 grams of sugar per day from foods & drinks with High Fructose Corn Sugar as #1,2,3 or # 4 on label. Follow a low carb nutrition program such as [employer] or [employer] to prevent wide sugar swings. White carbohydrates (potatoes, rice, bread, and pasta) have a high spike of sugar and a high load of sugar. For example a  baked potato has a cup of sugar and a  french fry  2 teaspoons of sugar. Yams, wild  rice, whole grained bread &  wheat pasta have been much lower spike and load of  sugar. Portions should be the size of a deck of cards or your palm.    Reflux of gastric acid may be asymptomatic as this may occur mainly during sleep.The triggers for reflux  include stress; the "aspirin family" ; alcohol; peppermint; and caffeine (coffee, tea, cola, and chocolate). The aspirin family would include aspirin and the nonsteroidal agents such as ibuprofen &  Naproxen. Tylenol would not cause reflux. If having symptoms ; food & drink should be avoided for @ least 2 hours before going to bed.    Minimal Blood Pressure Goal= AVERAGE < 140/90;  Ideal is an AVERAGE < 135/85. This AVERAGE should be calculated from @ least 5-7 BP readings taken @ different times of day on different days of week. You should not respond to isolated BP readings , but rather the AVERAGE for that week .Please bring your  blood pressure cuff to office visits to verify that it is reliable.It  can also be checked against the blood pressure device at the pharmacy. Finger or wrist cuffs are not dependable; an arm cuff is.

## 2013-07-11 ENCOUNTER — Other Ambulatory Visit: Payer: Self-pay | Admitting: Internal Medicine

## 2013-07-11 ENCOUNTER — Encounter: Payer: Self-pay | Admitting: Gastroenterology

## 2013-07-11 ENCOUNTER — Telehealth: Payer: Self-pay | Admitting: Internal Medicine

## 2013-07-11 NOTE — Telephone Encounter (Signed)
Relevant patient education assigned to patient using Emmi. ° °

## 2013-07-16 ENCOUNTER — Encounter: Payer: Self-pay | Admitting: *Deleted

## 2013-08-25 ENCOUNTER — Ambulatory Visit: Payer: BC Managed Care – PPO | Admitting: Gastroenterology

## 2013-10-13 ENCOUNTER — Encounter (HOSPITAL_BASED_OUTPATIENT_CLINIC_OR_DEPARTMENT_OTHER): Payer: Self-pay | Admitting: Emergency Medicine

## 2013-10-13 ENCOUNTER — Emergency Department (HOSPITAL_BASED_OUTPATIENT_CLINIC_OR_DEPARTMENT_OTHER)
Admission: EM | Admit: 2013-10-13 | Discharge: 2013-10-13 | Disposition: A | Discharge status: Home / Self Care | Payer: BC Managed Care – PPO | Attending: Emergency Medicine | Admitting: Emergency Medicine

## 2013-10-13 DIAGNOSIS — T1580XA Foreign body in other and multiple parts of external eye, unspecified eye, initial encounter: Secondary | ICD-10-CM | POA: Insufficient documentation

## 2013-10-13 DIAGNOSIS — T1592XA Foreign body on external eye, part unspecified, left eye, initial encounter: Secondary | ICD-10-CM

## 2013-10-13 DIAGNOSIS — J45909 Unspecified asthma, uncomplicated: Secondary | ICD-10-CM | POA: Insufficient documentation

## 2013-10-13 DIAGNOSIS — T1590XA Foreign body on external eye, part unspecified, unspecified eye, initial encounter: Secondary | ICD-10-CM | POA: Insufficient documentation

## 2013-10-13 DIAGNOSIS — K219 Gastro-esophageal reflux disease without esophagitis: Secondary | ICD-10-CM | POA: Insufficient documentation

## 2013-10-13 DIAGNOSIS — E042 Nontoxic multinodular goiter: Secondary | ICD-10-CM | POA: Insufficient documentation

## 2013-10-13 DIAGNOSIS — Z8669 Personal history of other diseases of the nervous system and sense organs: Secondary | ICD-10-CM | POA: Insufficient documentation

## 2013-10-13 DIAGNOSIS — Z8619 Personal history of other infectious and parasitic diseases: Secondary | ICD-10-CM | POA: Insufficient documentation

## 2013-10-13 DIAGNOSIS — Y9389 Activity, other specified: Secondary | ICD-10-CM | POA: Insufficient documentation

## 2013-10-13 DIAGNOSIS — Z7982 Long term (current) use of aspirin: Secondary | ICD-10-CM | POA: Insufficient documentation

## 2013-10-13 DIAGNOSIS — Y929 Unspecified place or not applicable: Secondary | ICD-10-CM | POA: Insufficient documentation

## 2013-10-13 DIAGNOSIS — Z79899 Other long term (current) drug therapy: Secondary | ICD-10-CM | POA: Insufficient documentation

## 2013-10-13 DIAGNOSIS — I10 Essential (primary) hypertension: Secondary | ICD-10-CM | POA: Insufficient documentation

## 2013-10-13 DIAGNOSIS — E785 Hyperlipidemia, unspecified: Secondary | ICD-10-CM | POA: Insufficient documentation

## 2013-10-13 MED ORDER — TETRACAINE HCL 0.5 % OP SOLN
2.0000 [drp] | Freq: Once | OPHTHALMIC | Status: AC
  Administered 2013-10-13: 2 [drp] via OPHTHALMIC
  Filled 2013-10-13: qty 2

## 2013-10-13 MED ORDER — ERYTHROMYCIN 5 MG/GM OP OINT
TOPICAL_OINTMENT | Freq: Four times a day (QID) | OPHTHALMIC | Status: DC
  Administered 2013-10-13: 18:00:00 via OPHTHALMIC
  Filled 2013-10-13: qty 3.5

## 2013-10-13 MED ORDER — FLUORESCEIN SODIUM 1 MG OP STRP
2.0000 | ORAL_STRIP | Freq: Once | OPHTHALMIC | Status: AC
  Administered 2013-10-13: 2 via OPHTHALMIC
  Filled 2013-10-13: qty 2

## 2013-10-13 NOTE — ED Provider Notes (Signed)
Medical screening examination/treatment/procedure(s) were performed by non-physician practitioner and as supervising physician I was immediately available for consultation/collaboration.   EKG Interpretation None        Wandra Arthurs, MD 10/13/13 [phone]

## 2013-10-13 NOTE — Discharge Instructions (Signed)
Eye, Foreign Body The term foreign body refers to any object near, on the surface of or in the eye that should not be there. A foreign body may be a small speck of dirt or dust, a hair or eyelash, a splinter or any object. CAUSES  Foreign bodies can get in the eye by:  Flying pieces of something that was broken or destroyed (debris).  A sudden injury (trauma) to the eye. SYMPTOMS  Symptoms depend on what the foreign body is and where it is in the eye. The most common locations are:  On the inner surface of the upper or lower eyelids or on the covering of the white part of the eye (conjunctiva). Symptoms in this location are:  Irritating and painful, especially when blinking.  Feeling like something is in the eye.  On the surface of the clear covering on the front of the eye (cornea). A corneal foreign body has symptoms that:  Are painful and irritating since the cornea is very sensitive.  Form small "rust rings" around a metallic foreign body. Metallic foreign bodies stick more firmly to the surface of the cornea.  Inside the eyeball. Infection can happen fast and can be hard to treat with antibiotics. This is an extremely dangerous situation. Foreign bodies inside the eye can threaten vision. A person may even loose their eye. Foreign bodies inside the eye may cause:  Great pain.  Immediate loss of vision. DIAGNOSIS  Foreign bodies are found during an exam by an eye specialist. Those that are on the eyelids, conjunctiva or cornea are usually (but not always) easily found. When a foreign body is inside the eyeball, a cataract may form almost right away. This makes it hard for an ophthalmologist to find the foreign body. Special tests may be needed, including ultrasound testing, X-rays and CT scans. TREATMENT   Foreign bodies that are on the eyelids, conjunctiva or cornea are often removed easily and painlessly.  If the foreign body has caused a scratch or abrasion of the cornea,  antibiotic drops, ointments and/or a tight patch called a "pressure patch" may be needed. Follow-up exams will be needed for several days until the abrasion heals.  Surgery is needed right away if the foreign body is inside the eyeball. This is a medical emergency. An antibiotic therapy will likely be given to stop an infection. HOME CARE INSTRUCTIONS  The use of eye patches is not universal. Their use varies from state to state and from caregiver to caregiver. If an eye patch was applied:  Keep the eye patch on for as long as directed by your caregiver until the follow-up appointment.  Do not remove the patch to put in medications unless instructed to do so. When replacing the patch, retape it as it was before. Follow the same procedure if the patch becomes loose.  WARNING: Do not drive or operate machinery while the eye is patched. The ability to judge distances will be impaired.  Only take over-the-counter or prescription medicines for pain, discomfort or fever as directed by the caregiver. If no eye patch was applied:  Keep the eye closed as much as possible. Do not rub the eye.  Wear dark glasses as needed to protect the eyes from bright light.  Do not wear contact lenses until the eye feels normal again, or as instructed.  Wear protective eye covering if there is a risk of eye injury. This is important when working with high speed tools.  Only take over-the-counter or   prescription medicines for pain, discomfort or fever as directed by the caregiver. SEEK IMMEDIATE MEDICAL CARE IF:   Pain increases in the eye or the vision changes.  You or your child has problems with the eye patch.  The injury to the eye appears to be getting larger.  There is discharge from the injured eye.  Swelling and/or soreness (inflammation) develops around the affected eye.  You or your child has an oral temperature above 102 F (38.9 C), not controlled by medicine.  Your baby is older than 3  months with a rectal temperature of 102 F (38.9 C) or higher.  Your baby is 3 months old or younger with a rectal temperature of 100.4 F (38 C) or higher. MAKE SURE YOU:   Understand these instructions.  Will watch your condition.  Will get help right away if you are not doing well or get worse. Document Released: 04/10/2005 Document Revised: 07/03/2011 Document Reviewed: 09/05/2012 Exit[employer] Patient Information 2015 [employer]. This information is not intended to replace advice given to you by your health care provider. Make sure you discuss any questions you have with your health care provider.  

## 2013-10-13 NOTE — ED Provider Notes (Signed)
CSN: [id number]     Arrival date & time 10/13/13  1504 History   First MD Initiated Contact with Patient 10/13/13 1656     Chief Complaint  Patient presents with  . Foreign Body in [place]     (Consider location/radiation/quality/duration/timing/severity/associated sxs/prior Treatment) Patient is a 53 y.o. male presenting with foreign body in eye. The history is provided by the patient. No language interpreter was used.  Foreign Body in Eye This is a new problem. The current episode started today. Pertinent negatives include no fever. Associated symptoms comments: He woke this morning with a foreign body sensation in the left eye. No visual changes. No drainage from the eye. He reports that the pain is worse when he gazes to left. No symptoms in the right eye, however, he reports he has no feeling in the right eye secondary to treatment for trigeminal neuralgia..    Past Medical History  Diagnosis Date  . Hypertension   . Unspecified intestinal obstruction   . Unspecified asthma(493.90)   . Chest pain, atypical   . Nontoxic multinodular goiter   . Painful respiration   . Dyspnea on exertion   . Trigeminal neuralgia   . Cluster headache   . Hyperlipidemia   . GERD (gastroesophageal reflux disease)   . Complication of anesthesia     difficult waking up"  . Food poisoning 11/2011  . Multiple thyroid nodules   . Hiatal hernia    Past Surgical History  Procedure Laterality Date  . Appendectomy    . Knee arthroscopy    . 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 Right shoulder redo-reconstruction  06/21/10  . Destruction trigeminal nerve via neurolytic agent     Family History  Problem Relation Age of Onset  . Diabetes Mother   . Hyperlipidemia Mother   . Hypertension Mother   . Hypertension Father   . Heart disease Father     CAD/CHF  . Cancer Father     Mesothelioma  . Heart disease Maternal Aunt   . Prostate cancer Neg Hx   . Colon cancer Neg  Hx    History  Substance Use Topics  . Smoking status: Current Every Day Smoker -- 0.25 packs/day for 18 years    Types: Cigarettes  . Smokeless tobacco: Never Used     Comment: 07/10/13 1/2 ppd  . Alcohol Use: No     Comment: rare    Review of Systems  Constitutional: Negative for fever.  Eyes: Positive for pain and redness. Negative for photophobia, discharge and visual disturbance.      Allergies  Review of patient's allergies indicates no known allergies.  Home Medications   Prior to Admission medications   Medication Sig Start Date End Date Taking? Authorizing Provider  aspirin 81 MG EC tablet Take 81 mg by mouth daily.      Historical Provider, MD  diltiazem (TIAZAC) 360 MG 24 hr capsule TAKE 1 CAPSULE (360 MG TOTAL) BY MOUTH DAILY. 07/11/13   Neena Rhymes, MD  lansoprazole (PREVACID) 30 MG capsule TAKE 1 CAPSULE (30 MG TOTAL) BY MOUTH DAILY.    Neena Rhymes, MD  levothyroxine (SYNTHROID, LEVOTHROID) 25 MCG tablet TAKE 1 TABLET (25 MCG TOTAL) BY MOUTH DAILY. 07/11/13   Neena Rhymes, MD  pantoprazole (PROTONIX) 40 MG tablet TAKE 1 TABLET (40 MG TOTAL) BY MOUTH DAILY. 02/22/13   Neena Rhymes, MD  promethazine (PHENERGAN) 25 MG tablet Take 1  tablet (25 mg total) by mouth every 6 (six) hours as needed for nausea or vomiting. 05/12/13   Glendell Docker, NP  VYTORIN 10-80 MG per tablet TAKE ONE TABLET BY MOUTH DAILY 07/11/13   Neena Rhymes, MD   BP 136/85  Pulse 73  Temp(Src) 97.8 F (36.6 C) (Oral)  Resp 18  Ht 5' 10.5" (1.791 m)  Wt 250 lb (113.399 kg)  BMI 35.35 kg/m2  SpO2 94% Physical Exam  Constitutional: He appears well-developed and well-nourished. No distress.  Eyes: Conjunctivae and EOM are normal. Pupils are equal, round, and reactive to light.  Small FB retreived from inverted upper eye lid. No surrounding swelling. No stain uptake so no evidence of corneal abrasion. Right eye examined and no FB visualized.     ED Course  Procedures  (including critical care time) Labs Review Labs Reviewed - No data to display  Imaging Review No results found.   EKG Interpretation None      MDM   Final diagnoses:  None    1. FB left eye  Unidentifiable FB removed without difficulty.     Dewaine Oats, PA-C 10/13/13 1757

## 2013-10-13 NOTE — ED Notes (Signed)
Woke with pain in his left eye. It felt like a piece of lint. He washed it with no improvement.

## 2013-10-30 ENCOUNTER — Ambulatory Visit (INDEPENDENT_AMBULATORY_CARE_PROVIDER_SITE_OTHER): Payer: BC Managed Care – PPO | Admitting: Family Medicine

## 2013-10-30 ENCOUNTER — Encounter: Payer: Self-pay | Admitting: Family Medicine

## 2013-10-30 VITALS — BP 134/84 | HR 83 | Temp 98.5°F | Ht 70.5 in | Wt 247.5 lb

## 2013-10-30 DIAGNOSIS — F17200 Nicotine dependence, unspecified, uncomplicated: Secondary | ICD-10-CM

## 2013-10-30 DIAGNOSIS — J069 Acute upper respiratory infection, unspecified: Secondary | ICD-10-CM

## 2013-10-30 MED ORDER — AMOXICILLIN-POT CLAVULANATE 875-125 MG PO TABS: 1.0000 | ORAL_TABLET | Freq: Two times a day (BID) | ORAL | Status: DC

## 2013-10-30 MED ORDER — GUAIFENESIN-CODEINE 100-10 MG/5ML PO SYRP: 5.0000 mL | ORAL_SOLUTION | Freq: Three times a day (TID) | ORAL | Status: DC | PRN

## 2013-10-30 NOTE — Progress Notes (Signed)
No chief complaint on file.   HPI:  -started: 10 days ago -symptoms:nasal congestion, sore throat, cough, fever initially - started to get better then worse the last few days -denies:fever, SOB, NVD, tooth pain -has tried: OTC meds and cough syrup -sick contacts/travel/risks: denies flu exposure, tick exposure or or Ebola risks -Hx of: tobacco use ROS: See pertinent positives and negatives per HPI.  Past Medical History  Diagnosis Date  . Hypertension   . Unspecified intestinal obstruction   . Unspecified asthma(493.90)   . Chest pain, atypical   . Nontoxic multinodular goiter   . Painful respiration   . Dyspnea on exertion   . Trigeminal neuralgia   . Cluster headache   . Hyperlipidemia   . GERD (gastroesophageal reflux disease)   . Complication of anesthesia     difficult waking up"  . Food poisoning 11/2011  . Multiple thyroid nodules   . Hiatal hernia     Past Surgical History  Procedure Laterality Date  . Appendectomy    . Knee arthroscopy    . 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 Right shoulder redo-reconstruction  06/21/10  . Destruction trigeminal nerve via neurolytic agent      Family History  Problem Relation Age of Onset  . Diabetes Mother   . Hyperlipidemia Mother   . Hypertension Mother   . Hypertension Father   . Heart disease Father     CAD/CHF  . Cancer Father     Mesothelioma  . Heart disease Maternal Aunt   . Prostate cancer Neg Hx   . Colon cancer Neg Hx     History   Social History  . Marital Status: Married    Spouse Name: N/A    Number of Children: N/A  . 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 Smoking status: Current Every Day Smoker -- 0.25 packs/day for 18 years    Types: Cigarettes  . Smokeless tobacco: Never Used     Comment: 07/10/13 1/2 ppd  . Alcohol Use: No     Comment: rare  . Drug Use: No  . Sexual  Activity: Not Currently   Other Topics Concern  . None   Social History Narrative   HSG   Culinary school in [place]   Married - '84 - 5 years divorced; remarried '96   1 son - '85      Regular Exercise -  NO          Current outpatient prescriptions:aspirin 81 MG EC tablet, Take 81 mg by mouth daily.  , Disp: , Rfl: ;  diltiazem (TIAZAC) 360 MG 24 hr capsule, TAKE 1 CAPSULE (360 MG TOTAL) BY MOUTH DAILY., Disp: 30 capsule, Rfl: 5;  lansoprazole (PREVACID) 30 MG capsule, TAKE 1 CAPSULE (30 MG TOTAL) BY MOUTH DAILY., Disp: 30 capsule, Rfl: 5;  levothyroxine (SYNTHROID, LEVOTHROID) 25 MCG tablet, TAKE 1 TABLET (25 MCG TOTAL) BY MOUTH DAILY., Disp: 30 tablet, Rfl: 5 pantoprazole (PROTONIX) 40 MG tablet, TAKE 1 TABLET (40 MG TOTAL) BY MOUTH DAILY., Disp: 90 tablet, Rfl: 1;  amoxicillin-clavulanate (AUGMENTIN) 875-125 MG per tablet, Take 1 tablet by mouth 2 (two) times daily., Disp: 20 tablet, Rfl: 0;  guaiFENesin-codeine (CHERATUSSIN AC) 100-10 MG/5ML syrup, Take 5 mLs by mouth 3 (three) times daily as needed for cough., Disp: 120 mL, Rfl: 0  EXAM:  Filed Vitals:  10/30/13 1356  BP: 134/84  Pulse: 83  Temp: 98.5 F (36.9 C)    Body mass index is 35 kg/(m^2).  GENERAL: vitals reviewed and listed above, alert, oriented, appears well hydrated and in no acute distress  HEENT: atraumatic, conjunttiva clear, no obvious abnormalities on inspection of external nose and ears, normal appearance of ear canals and TMs, thick nasal congestion, mild post oropharyngeal erythema with PND, no tonsillar edema or exudate, no sinus TTP  NECK: no obvious masses on inspection  LUNGS: clear to auscultation bilaterally, no wheezes, rales or rhonchi, good air movement  CV: HRRR, no peripheral edema  MS: moves all extremities without noticeable abnormality  PSYCH: pleasant and cooperative, no obvious depression or anxiety  ASSESSMENT AND PLAN:  Discussed the following assessment and  plan:  Upper respiratory infection - Plan: amoxicillin-clavulanate (AUGMENTIN) 875-125 MG per tablet, guaiFENesin-codeine (CHERATUSSIN AC) 100-10 MG/5ML syrup  -We discussed potential etiologies, with VURI being most likely, with possible 2ndary bacterial infection -We discussed treatment side effects, likely course, antibiotic misuse, transmission, and signs of developing a serious illness. -advised to quit smoking -of course, we advised to return or notify a doctor immediately if symptoms worsen or persist or new concerns arise.    There are no Patient Instructions on file for this visit.   Colin Benton R.

## 2013-10-30 NOTE — Progress Notes (Signed)
Pre visit review using our clinic review tool, if applicable. No additional management support is needed unless otherwise documented below in the visit note. 

## 2013-12-17 ENCOUNTER — Ambulatory Visit: Payer: BC Managed Care – PPO | Admitting: Physician Assistant

## 2013-12-24 ENCOUNTER — Encounter: Payer: Self-pay | Admitting: Physician Assistant

## 2013-12-24 ENCOUNTER — Ambulatory Visit (INDEPENDENT_AMBULATORY_CARE_PROVIDER_SITE_OTHER): Payer: BC Managed Care – PPO | Admitting: Physician Assistant

## 2013-12-24 ENCOUNTER — Telehealth: Payer: Self-pay | Admitting: *Deleted

## 2013-12-24 VITALS — BP 140/74 | HR 77 | Temp 98.2°F | Wt 260.0 lb

## 2013-12-24 DIAGNOSIS — R351 Nocturia: Secondary | ICD-10-CM

## 2013-12-24 DIAGNOSIS — R3911 Hesitancy of micturition: Secondary | ICD-10-CM

## 2013-12-24 DIAGNOSIS — Z125 Encounter for screening for malignant neoplasm of prostate: Secondary | ICD-10-CM | POA: Insufficient documentation

## 2013-12-24 DIAGNOSIS — N401 Enlarged prostate with lower urinary tract symptoms: Secondary | ICD-10-CM | POA: Insufficient documentation

## 2013-12-24 DIAGNOSIS — Z1211 Encounter for screening for malignant neoplasm of colon: Secondary | ICD-10-CM

## 2013-12-24 DIAGNOSIS — K219 Gastro-esophageal reflux disease without esophagitis: Secondary | ICD-10-CM

## 2013-12-24 DIAGNOSIS — I10 Essential (primary) hypertension: Secondary | ICD-10-CM

## 2013-12-24 DIAGNOSIS — J322 Chronic ethmoidal sinusitis: Secondary | ICD-10-CM

## 2013-12-24 DIAGNOSIS — E039 Hypothyroidism, unspecified: Secondary | ICD-10-CM

## 2013-12-24 MED ORDER — TAMSULOSIN HCL 0.4 MG PO CAPS: 0.4000 mg | ORAL_CAPSULE | Freq: Every day | ORAL | Status: DC

## 2013-12-24 MED ORDER — LANSOPRAZOLE 30 MG PO CPDR
DELAYED_RELEASE_CAPSULE | ORAL | Status: DC
Start: 2013-12-24 — End: 2014-01-21

## 2013-12-24 MED ORDER — ALPRAZOLAM 1 MG PO TABS: 1.0000 mg | ORAL_TABLET | Freq: Every evening | ORAL | Status: DC | PRN

## 2013-12-24 MED ORDER — SULFAMETHOXAZOLE-TMP DS 800-160 MG PO TABS: 1.0000 | ORAL_TABLET | Freq: Two times a day (BID) | ORAL | Status: DC

## 2013-12-24 MED ORDER — FLUTICASONE PROPIONATE 50 MCG/ACT NA SUSP: 2.0000 | Freq: Every day | NASAL | Status: DC

## 2013-12-24 MED ORDER — METHYLPREDNISOLONE ACETATE 40 MG/ML IJ SUSP
40.0000 mg | Freq: Once | INTRAMUSCULAR | Status: AC
  Administered 2013-12-24: 40 mg via INTRAMUSCULAR

## 2013-12-24 NOTE — Assessment & Plan Note (Signed)
Will obtain urinalysis and PSA. Patient defers DRE at present. We'll begin his complete physical. We'll attempt trial of Flomax at bedtime. Patient educated on side effects of medication. Will followup in 3-4 weeks at complete physical exam. May possibly need referral to urology depending on lab results.

## 2013-12-24 NOTE — Progress Notes (Signed)
Pre visit review using our clinic review tool, if applicable. No additional management support is needed unless otherwise documented below in the visit note. 

## 2013-12-24 NOTE — Assessment & Plan Note (Signed)
Patient previously on 25 mcg of branded Synthroid. There has been no repeat thyroid function testing in several years that I can see in our EMR. Will obtain TSH level. Will alter medication regimen based on results.

## 2013-12-24 NOTE — Assessment & Plan Note (Signed)
PSA level obtained today.

## 2013-12-24 NOTE — Assessment & Plan Note (Signed)
Well controlled with current regimen of Prevacid. Continue current medication regimen. Avoid sugar foods. Avoid late 98. Elevate head of bed. Encourage weight loss as this will reduce reflux.

## 2013-12-24 NOTE — Assessment & Plan Note (Signed)
Patient referred to GI for screening colonoscopy .

## 2013-12-24 NOTE — Assessment & Plan Note (Signed)
Continue current regimen. Will obtain labs at complete physical. Encouraged patient to attempt weight loss measures. DASH diet handout given.

## 2013-12-24 NOTE — Progress Notes (Signed)
Patient presents to clinic today to establish care.  Acute Concerns: Patient presents to clinic c/o chronic sinus congestion, sinus pressure, and drainage.  Denies history of seasonal allergies.  Is non-smoker. Endorses purulent rhinorrhea.denies ear pain or tooth pain. Patient has not been evaluated by ENT previously.  Patient also complains of chronic insomnia, that he feels stems from his blocked sinuses and history of obstructive sleep apnea. Patient is not currently on CPAP. Was fitted for CPAP previously but did not follow through. Patient endorses excessive fatigue and daytime somnolence. Endorses waking up coughing and feeling short of breath. Wife states he snores very loudly.  Patient also complains of with frequent urination, more specifically at night. Endorses nocturia 3-5 times per night. Patient also endorses urinary hesitancy and postvoid dribbling. Endorses incomplete bladder in 19. Patient denies history of known prostatic enlargement or prostate cancer. Does have a big family history of prostate cancer. Has never had a PSA level checked. Patient denies dysuria, hematuria, nausea, vomiting, fever, chills or flank pain.  Chronic Issues: Hypertension --patient endorses well controlled with dietary measures and diltiazem 360 mg daily. BP normotensive in clinic. Patient denies symptoms of chest pain, palpitations, lightheadedness, dizziness, frequent headaches or vision changes.  GERD --  Patient endorses well controlled presently with Prevacid daily. Patient is watching what he eats. Patient is aware that way also be beneficial.  Hypothyroidism -- patient currently taking 25 mcg of Synthroid daily. Has not had thyroid function testing in several years. Has been getting medication from his previous PCP, despite no routine monitoring of thyroid.  Health Maintenance: Dental -- overdue Vision -- overdue Immunizations -- Declines Tetanus and Flu shot. Colonoscopy -- overdue.  Will place  referral to GI for screening colonoscopy.  Past Medical History  Diagnosis Date  . Hypertension   . Unspecified intestinal obstruction   . Unspecified asthma(493.90)   . Chest pain, atypical   . Nontoxic multinodular goiter   . Painful respiration   . Dyspnea on exertion   . Trigeminal neuralgia   . Cluster headache   . Hyperlipidemia   . GERD (gastroesophageal reflux disease)   . Complication of anesthesia     difficult waking up"  . Food poisoning 11/2011  . Multiple thyroid nodules   . Hiatal hernia     Past Surgical History  Procedure Laterality Date  . Appendectomy    . Knee arthroscopy    . 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 Right shoulder redo-reconstruction  06/21/10  . Destruction trigeminal nerve via neurolytic agent      Current Outpatient Prescriptions on File Prior to Visit  Medication Sig Dispense Refill  . aspirin 81 MG EC tablet Take 81 mg by mouth daily.        Marland Kitchen diltiazem (TIAZAC) 360 MG 24 hr capsule TAKE 1 CAPSULE (360 MG TOTAL) BY MOUTH DAILY.  30 capsule  5  . levothyroxine (SYNTHROID, LEVOTHROID) 25 MCG tablet TAKE 1 TABLET (25 MCG TOTAL) BY MOUTH DAILY.  30 tablet  5  . pantoprazole (PROTONIX) 40 MG tablet TAKE 1 TABLET (40 MG TOTAL) BY MOUTH DAILY.  90 tablet  1   No current facility-administered medications on file prior to visit.    No Known Allergies  Family History  Problem Relation Age of Onset  . Diabetes Mother   . Hyperlipidemia Mother   . Hypertension Mother   . Hypertension Father   . Heart disease Father  CAD/CHF  . Cancer Father     Mesothelioma  . Heart disease Maternal Aunt   . Prostate cancer Neg Hx   . Colon cancer Neg Hx     History   Social History  . Marital Status: Married    Spouse Name: N/A    Number of Children: N/A  . 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 Smoking status: Current Every Day  Smoker -- 0.25 packs/day for 18 years    Types: Cigarettes  . Smokeless tobacco: Never Used     Comment: 07/10/13 1/2 ppd  . Alcohol Use: No     Comment: rare  . Drug Use: No  . Sexual Activity: Not Currently   Other Topics Concern  . Not on file   Social History Narrative   HSG   Culinary school in [place]   Married - '84 - 5 years divorced; remarried '96   1 son - '85      Regular Exercise -  NO         ROS See history of present illness. All other review of systems are negative.  BP 140/74  Pulse 77  Temp(Src) 98.2 F (36.8 C)  Wt 260 lb (117.935 kg)  SpO2 99%  Physical Exam  Constitutional: He is oriented to person, place, and time.  Well-developed, obese African American gentleman in no acute distress, sitting comfortably on examination table.  HENT:  Head: Normocephalic and atraumatic.  Right Ear: Tympanic membrane, external ear and ear canal normal.  Left Ear: Tympanic membrane, external ear and ear canal normal.  Nose: Mucosal edema, rhinorrhea and sinus tenderness present. No septal deviation. Right sinus exhibits frontal sinus tenderness. Left sinus exhibits frontal sinus tenderness.  Mouth/Throat: Uvula is midline, oropharynx is clear and moist and mucous membranes are normal.  Eyes: Conjunctivae are normal. Pupils are equal, round, and reactive to light.  Neck: Neck supple. No thyromegaly present.  Cardiovascular: Normal rate, regular rhythm, normal heart sounds and intact distal pulses.   Pulmonary/Chest: Effort normal and breath sounds normal. No respiratory distress. He has no wheezes. He has no rales. He exhibits no tenderness.  Abdominal: Soft. Bowel sounds are normal. He exhibits no distension and no mass. There is no tenderness. There is no rebound and no guarding.  Lymphadenopathy:    He has no cervical adenopathy.  Neurological: He is alert and oriented to person, place, and time.  Skin: Skin is warm and dry. No rash noted.  Psychiatric: Affect  normal.   Assessment/Plan: HYPERTENSION Continue current regimen. Will obtain labs at complete physical. Encouraged patient to attempt weight loss measures. DASH diet handout given.  GERD Well controlled with current regimen of Prevacid. Continue current medication regimen. Avoid sugar foods. Avoid late 85. Elevate head of bed. Encourage weight loss as this will reduce reflux.  BPH associated with nocturia Will obtain urinalysis and PSA. Patient defers DRE at present. We'll begin his complete physical. We'll attempt trial of Flomax at bedtime. Patient educated on side effects of medication. Will followup in 3-4 weeks at complete physical exam. May possibly need referral to urology depending on lab results.  Prostate cancer screening PSA level obtained today.  Unspecified hypothyroidism Patient previously on 25 mcg of branded Synthroid. There has been no repeat thyroid function testing in several years that I can see in our EMR. Will obtain TSH level. Will alter medication regimen based on results.  Chronic ethmoidal sinusitis IM Depo-Medrol given in clinic. Rx Bactrim. Increase fluid intake. Daily Zyrtec and Flonase. Place humidifier in bedroom. Continue saline nasal spray. Begin daily probiotic. Referral to ENT place for further evaluation and management.  Colon cancer screening Patient referred to GI for screening colonoscopy.

## 2013-12-24 NOTE — Patient Instructions (Signed)
Please obtain labs.  I will call you with your results. I want to see you within a month for a Complete Physical.  Come fasting to that visit so that we can check your cholesterol levels.  For Sinuses -- The steroid shot you were given should help with your symptoms.  Increase fluid intake.  Take antibiotic as directed. Take Zyrtec daily.  Use Flonase daily as directed.  Continue saline nasal spray.  I am sending you to ENT for further evaluation of recurrent and severe symptoms.  For Urinary Symptoms -- Again, I will call you with your results.  Take Flomax as directed each evening.  Follow-up in 1 month.  If labs are abnormal, we will need to set you up a Urologist.   For Insomnia -- I am giving you a prescription for Xanax (short-term) to help with sleep.  Please take as directed.  For Thyroid -- I will call you with your results.  We will alter your dose if indicated.  We will send in a refill once the result is in.  You will be contacted by Gastroenterology for a screening colonoscopy.

## 2013-12-24 NOTE — Assessment & Plan Note (Signed)
IM Depo-Medrol given in clinic. Rx Bactrim. Increase fluid intake. Daily Zyrtec and Flonase. Place humidifier in bedroom. Continue saline nasal spray. Begin daily probiotic. Referral to ENT place for further evaluation and management.

## 2013-12-24 NOTE — Telephone Encounter (Signed)
Xanax faxed to pts Cytogeneticist.

## 2013-12-25 ENCOUNTER — Telehealth: Payer: Self-pay | Admitting: Physician Assistant

## 2013-12-25 LAB — URINALYSIS, ROUTINE W REFLEX MICROSCOPIC
Bilirubin Urine: NEGATIVE
Hgb urine dipstick: NEGATIVE
Ketones, ur: NEGATIVE
Leukocytes, UA: NEGATIVE
Nitrite: NEGATIVE
RBC / HPF: NONE SEEN (ref 0–?)
Specific Gravity, Urine: <=1.005 — AB (ref 1.000–1.030)
Total Protein, Urine: NEGATIVE
Urine Glucose: NEGATIVE
Urobilinogen, UA: 0.2 (ref 0.0–1.0)
WBC, UA: NONE SEEN (ref 0–?)
pH: 6 (ref 5.0–8.0)

## 2013-12-25 LAB — TSH: TSH: 0.89 u[IU]/mL (ref 0.35–4.50)

## 2013-12-25 LAB — PSA: PSA: 0.54 ng/mL (ref 0.10–4.00)

## 2013-12-25 MED ORDER — LEVOTHYROXINE SODIUM 25 MCG PO TABS: ORAL_TABLET | ORAL | Status: DC

## 2013-12-25 NOTE — Telephone Encounter (Signed)
Pt notified  Pt verbalized understanding

## 2013-12-25 NOTE — Telephone Encounter (Signed)
Labs look good overall. I will send in a refill of his Synthroid.  Will monitor TSH yearly.

## 2014-01-14 ENCOUNTER — Encounter: Payer: BC Managed Care – PPO | Admitting: Physician Assistant

## 2014-01-20 ENCOUNTER — Encounter: Payer: BC Managed Care – PPO | Admitting: Physician Assistant

## 2014-01-20 DIAGNOSIS — Z0289 Encounter for other administrative examinations: Secondary | ICD-10-CM

## 2014-01-21 ENCOUNTER — Other Ambulatory Visit: Payer: Self-pay | Admitting: Geriatric Medicine

## 2014-01-21 MED ORDER — LANSOPRAZOLE 30 MG PO CPDR: DELAYED_RELEASE_CAPSULE | ORAL | Status: DC

## 2014-01-27 ENCOUNTER — Other Ambulatory Visit: Payer: Self-pay

## 2014-01-28 MED ORDER — ALPRAZOLAM 1 MG PO TABS: 1.0000 mg | ORAL_TABLET | Freq: Every evening | ORAL | Status: DC | PRN

## 2014-01-28 NOTE — Telephone Encounter (Signed)
eScribe request from [name] for refill on Alprazolam 1 mg Last filled - 09.02.15, #[id number] Last AEX - 09.02.15 [New Pt] Next AEX - 1 Mth CPE Scheduled appt for Wed, 1.14.15 at 7:30a Please Advise on refills/SLS

## 2014-01-28 NOTE — Telephone Encounter (Signed)
Rx request phoned to pharmacy/SLS  

## 2014-01-28 NOTE — Telephone Encounter (Signed)
Ok to send 30 tabs zero refills. 

## 2014-02-04 ENCOUNTER — Ambulatory Visit (INDEPENDENT_AMBULATORY_CARE_PROVIDER_SITE_OTHER): Payer: BC Managed Care – PPO | Admitting: Physician Assistant

## 2014-02-04 ENCOUNTER — Encounter: Payer: Self-pay | Admitting: Physician Assistant

## 2014-02-04 VITALS — BP 162/113 | HR 87 | Temp 98.6°F | Resp 16 | Ht 70.5 in | Wt 252.0 lb

## 2014-02-04 DIAGNOSIS — K219 Gastro-esophageal reflux disease without esophagitis: Secondary | ICD-10-CM

## 2014-02-04 DIAGNOSIS — K209 Esophagitis, unspecified without bleeding: Secondary | ICD-10-CM

## 2014-02-04 DIAGNOSIS — Z136 Encounter for screening for cardiovascular disorders: Secondary | ICD-10-CM

## 2014-02-04 DIAGNOSIS — E039 Hypothyroidism, unspecified: Secondary | ICD-10-CM

## 2014-02-04 DIAGNOSIS — I10 Essential (primary) hypertension: Secondary | ICD-10-CM

## 2014-02-04 DIAGNOSIS — Z0000 Encounter for general adult medical examination without abnormal findings: Secondary | ICD-10-CM

## 2014-02-04 LAB — BASIC METABOLIC PANEL
BUN: 11 mg/dL (ref 6–23)
CO2: 31 mEq/L (ref 19–32)
Calcium: 9.2 mg/dL (ref 8.4–10.5)
Chloride: 100 mEq/L (ref 96–112)
Creatinine, Ser: 1.1 mg/dL (ref 0.4–1.5)
GFR: 89.87 mL/min (ref >60.00)
Glucose, Bld: 100 mg/dL — ABNORMAL HIGH (ref 70–99)
Potassium: 4.4 mEq/L (ref 3.5–5.1)
Sodium: 138 mEq/L (ref 135–145)

## 2014-02-04 LAB — HEPATIC FUNCTION PANEL
ALT: 22 U/L (ref 0–53)
AST: 24 U/L (ref 0–37)
Albumin: 3.5 g/dL (ref 3.5–5.2)
Alkaline Phosphatase: 66 U/L (ref 39–117)
Bilirubin, Direct: 0.1 mg/dL (ref 0.0–0.3)
Total Bilirubin: 0.6 mg/dL (ref 0.2–1.2)
Total Protein: 7.3 g/dL (ref 6.0–8.3)

## 2014-02-04 LAB — CBC
HCT: 46.1 % (ref 39.0–52.0)
Hemoglobin: 15 g/dL (ref 13.0–17.0)
MCHC: 32.6 g/dL (ref 30.0–36.0)
MCV: 96 fl (ref 78.0–100.0)
Platelets: 188 10*3/uL (ref 150.0–400.0)
RBC: 4.81 Mil/uL (ref 4.22–5.81)
RDW: 14.4 % (ref 11.5–15.5)
WBC: 11.5 10*3/uL — ABNORMAL HIGH (ref 4.0–10.5)

## 2014-02-04 LAB — LIPID PANEL
Cholesterol: 259 mg/dL — ABNORMAL HIGH (ref 0–200)
HDL: 47.9 mg/dL (ref >39.00)
LDL Cholesterol: 193 mg/dL — ABNORMAL HIGH (ref 0–99)
NonHDL: 211.1
Total CHOL/HDL Ratio: 5
Triglycerides: 93 mg/dL (ref 0.0–149.0)
VLDL: 18.6 mg/dL (ref 0.0–40.0)

## 2014-02-04 LAB — URINALYSIS, ROUTINE W REFLEX MICROSCOPIC
Bilirubin Urine: NEGATIVE
Hgb urine dipstick: NEGATIVE
Ketones, ur: NEGATIVE
Leukocytes, UA: NEGATIVE
Nitrite: NEGATIVE
RBC / HPF: NONE SEEN (ref 0–?)
Specific Gravity, Urine: 1.015 (ref 1.000–1.030)
Total Protein, Urine: NEGATIVE
Urine Glucose: NEGATIVE
Urobilinogen, UA: 0.2 (ref 0.0–1.0)
WBC, UA: NONE SEEN (ref 0–?)
pH: 7 (ref 5.0–8.0)

## 2014-02-04 LAB — TSH: TSH: 0.76 u[IU]/mL (ref 0.35–4.50)

## 2014-02-04 LAB — PSA: PSA: 0.46 ng/mL (ref 0.10–4.00)

## 2014-02-04 LAB — HEMOGLOBIN A1C: Hgb A1c MFr Bld: 5.9 % (ref 4.6–6.5)

## 2014-02-04 MED ORDER — LANSOPRAZOLE 15 MG PO CPDR: 15.0000 mg | DELAYED_RELEASE_CAPSULE | Freq: Every day | ORAL | Status: DC

## 2014-02-04 NOTE — Assessment & Plan Note (Signed)
Reiterated importance of medication compliance.  EKG with NSR.  Medications refilled.

## 2014-02-04 NOTE — Assessment & Plan Note (Signed)
Will check TSH today 

## 2014-02-04 NOTE — Progress Notes (Signed)
Patient presents to clinic today for annual exam.  Patient is fasting for labs.  Patient with hypertension, previously well-controlled with Diltiazem.  BP elevated at 162/113 in clinic today.  Patient states he has not taken his medication this morning.  Denies chest pain, palpitations, headache or vision changes.   Patient states he is having some breakthrough GERD symptoms despite taking Prevacid daily. Denies abdominal pain, but endorses significant reflux.  Denies change to diet.   Patient taking medications for Levothyroxine, ASA and Xanax as directed.  Health Maintenance: Dental -- Overdue.  Patient has upcoming appointment. Vision -- Overdue.  Patient has upcoming appointment. Immunizations -- Declines Flu shot. Declines Tetanus at today's visit.  Colonoscopy -- Referral GI for colonoscopy.  Past Medical History  Diagnosis Date  . Hypertension   . Unspecified intestinal obstruction   . Unspecified asthma(493.90)   . Chest pain, atypical   . Nontoxic multinodular goiter   . Painful respiration   . Dyspnea on exertion   . Trigeminal neuralgia   . Cluster headache   . Hyperlipidemia   . GERD (gastroesophageal reflux disease)   . Complication of anesthesia     difficult waking up"  . Food poisoning 11/2011  . Multiple thyroid nodules   . Hiatal hernia     Past Surgical History  Procedure Laterality Date  . Appendectomy    . Knee arthroscopy    . 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 Right shoulder redo-reconstruction  06/21/10  . Destruction trigeminal nerve via neurolytic agent      Current Outpatient Prescriptions on File Prior to Visit  Medication Sig Dispense Refill  . ALPRAZolam (XANAX) 1 MG tablet Take 1 tablet (1 mg total) by mouth at bedtime as needed for anxiety.  30 tablet  0  . aspirin 81 MG EC tablet Take 81 mg by mouth daily.        Marland Kitchen diltiazem (TIAZAC) 360 MG 24 hr capsule TAKE 1 CAPSULE (360 MG TOTAL) BY MOUTH  DAILY.  30 capsule  5  . fluticasone (FLONASE) 50 MCG/ACT nasal spray Place 2 sprays into both nostrils daily.  16 g  6  . lansoprazole (PREVACID) 30 MG capsule TAKE 1 CAPSULE (30 MG TOTAL) BY MOUTH DAILY.  30 capsule  1  . levothyroxine (SYNTHROID, LEVOTHROID) 25 MCG tablet TAKE 1 TABLET (25 MCG TOTAL) BY MOUTH DAILY.  30 tablet  5  . pantoprazole (PROTONIX) 40 MG tablet TAKE 1 TABLET (40 MG TOTAL) BY MOUTH DAILY.  90 tablet  1  . tamsulosin (FLOMAX) 0.4 MG CAPS capsule Take 1 capsule (0.4 mg total) by mouth daily after supper.  30 capsule  3   No current facility-administered medications on file prior to visit.    No Known Allergies  Family History  Problem Relation Age of Onset  . Diabetes Mother   . Hyperlipidemia Mother   . Hypertension Mother   . Hypertension Father   . Heart disease Father     CAD/CHF  . Cancer Father     Mesothelioma  . Heart disease Maternal Aunt   . Prostate cancer Neg Hx   . Colon cancer Neg Hx     History   Social History  . Marital Status: Married    Spouse Name: N/A    Number of Children: N/A  . Years of Education: N/A   Occupational History  . [employer]    Social History  Main Topics  . Smoking status: Current Every Day Smoker -- 0.25 packs/day for 18 years    Types: Cigarettes  . Smokeless tobacco: Never Used     Comment: 07/10/13 1/2 ppd  . Alcohol Use: No     Comment: rare  . Drug Use: No  . Sexual Activity: Not Currently   Other Topics Concern  . Not on file   Social History Narrative   [employer]   [employer] school in [place]   Married - '84 - 5 years divorced; remarried '96   1 son - '85      Regular Exercise -  NO         Review of Systems  Constitutional: Negative for fever and weight loss.  HENT: Negative for ear pain, hearing loss and tinnitus.   Eyes: Negative for blurred vision, double vision, photophobia and pain.  Respiratory: Negative for cough and shortness of breath.   Cardiovascular:  Negative for chest pain and palpitations.  Gastrointestinal: Positive for heartburn. Negative for nausea, vomiting, abdominal pain, diarrhea, constipation, blood in stool and melena.  Genitourinary: Negative for dysuria, urgency, frequency, hematuria and flank pain.  Neurological: Negative for dizziness, loss of consciousness and headaches.  Psychiatric/Behavioral: Negative for depression, suicidal ideas, hallucinations and substance abuse. The patient is nervous/anxious. The patient does not have insomnia.    BP 162/113  Pulse 87  Temp(Src) 98.6 F (37 C) (Oral)  Resp 16  Ht 5' 10.5" (1.791 m)  Wt 252 lb (114.306 kg)  BMI 35.64 kg/m2  SpO2 96%  Physical Exam  Vitals reviewed. Constitutional: He is oriented to person, place, and time and well-developed, well-nourished, and in no distress.  HENT:  Head: Normocephalic and atraumatic.  Eyes: Conjunctivae are normal.  Neck: Neck supple.  Cardiovascular: Normal rate, regular rhythm, normal heart sounds and intact distal pulses.   Pulmonary/Chest: Effort normal and breath sounds normal. No respiratory distress. He has no wheezes. He has no rales. He exhibits no tenderness.  Abdominal: Soft. Bowel sounds are normal. He exhibits no distension and no mass. There is no tenderness. There is no rebound and no guarding.  Neurological: He is alert and oriented to person, place, and time.  Skin: Skin is warm and dry. No rash noted.  Psychiatric: Affect normal.    Recent Results (from the past 2160 hour(s))  PSA     Status: None   Collection Time    12/24/13  3:49 PM      Result Value Ref Range   PSA 0.54  0.10 - 4.00 ng/mL  TSH     Status: None   Collection Time    12/24/13  3:49 PM      Result Value Ref Range   TSH 0.89  0.35 - 4.50 uIU/mL  URINALYSIS, ROUTINE W REFLEX MICROSCOPIC     Status: Abnormal   Collection Time    12/24/13  3:49 PM      Result Value Ref Range   Color, Urine YELLOW  Yellow;Lt. Yellow   APPearance CLEAR  Clear     Specific Gravity, Urine <=1.005 (*) 1.000 - 1.030   pH 6.0  5.0 - 8.0   Total Protein, Urine NEGATIVE  Negative   Urine Glucose NEGATIVE  Negative   Ketones, ur NEGATIVE  Negative   Bilirubin Urine NEGATIVE  Negative   Hgb urine dipstick NEGATIVE  Negative   Urobilinogen, UA 0.2  0.0 - 1.0   Leukocytes, UA NEGATIVE  Negative   Nitrite NEGATIVE  Negative  WBC, UA none seen  0-2/hpf   RBC / HPF none seen  0-2/hpf   Assessment/Plan: Essential hypertension Reiterated importance of medication compliance.  EKG with NSR.  Medications refilled.  GERD Some concern for mild reflux esophagitis.  Continue Prevacid 30 mg AM and add 15 mg Prevacid in PM for 2 weeks.  Avoid NSAIDs, alcohol consumption and spicy foods.  Referral placed to GI for persistent symptoms despite multiple previous regimens.  Hypothyroidism Will check TSH today.  Visit for preventive health examination Medical history reviewed.  Patient declines immunizations.  Will refer to GI for colonoscopy. EKG with NSR.  Screening for ischemic heart disease EKG reveals NSR.

## 2014-02-04 NOTE — Assessment & Plan Note (Signed)
Some concern for mild reflux esophagitis.  Continue Prevacid 30 mg AM and add 15 mg Prevacid in PM for 2 weeks.  Avoid NSAIDs, alcohol consumption and spicy foods.  Referral placed to GI for persistent symptoms despite multiple previous regimens.

## 2014-02-04 NOTE — Assessment & Plan Note (Signed)
Medical history reviewed.  Patient declines immunizations.  Will refer to GI for colonoscopy. EKG with NSR.

## 2014-02-04 NOTE — Assessment & Plan Note (Signed)
EKG reveals NSR.

## 2014-02-04 NOTE — Progress Notes (Signed)
Pre visit review using our clinic review tool, if applicable. No additional management support is needed unless otherwise documented below in the visit note/SLS  

## 2014-02-04 NOTE — Patient Instructions (Signed)
Please continue medications as directed.  Take 30 mg Prevacid in the morning and a 15 mg Prevacid in the evening over the next 2 weeks.  Make sure to always take your blood pressure medication.  Preventive Care for Adults A healthy lifestyle and preventive care can promote health and wellness. Preventive health guidelines for men include the following key practices:  A routine yearly physical is a good way to check with your health care provider about your health and preventative screening. It is a chance to share any concerns and updates on your health and to receive a thorough exam.  Visit your dentist for a routine exam and preventative care every 6 months. Brush your teeth twice a day and floss once a day. Good oral hygiene prevents tooth decay and gum disease.  The frequency of eye exams is based on your age, health, family medical history, use of contact lenses, and other factors. Follow your health care provider's recommendations for frequency of eye exams.  Eat a healthy diet. Foods such as vegetables, fruits, whole grains, low-fat dairy products, and lean protein foods contain the nutrients you need without too many calories. Decrease your intake of foods high in solid fats, added sugars, and salt. Eat the right amount of calories for you.Get information about a proper diet from your health care provider, if necessary.  Regular physical exercise is one of the most important things you can do for your health. Most adults should get at least 150 minutes of moderate-intensity exercise (any activity that increases your heart rate and causes you to sweat) each week. In addition, most adults need muscle-strengthening exercises on 2 or more days a week.  Maintain a healthy weight. The body mass index (BMI) is a screening tool to identify possible weight problems. It provides an estimate of body fat based on height and weight. Your health care provider can find your BMI and can help you achieve or  maintain a healthy weight.For adults 20 years and older:  A BMI below 18.5 is considered underweight.  A BMI of 18.5 to 24.9 is normal.  A BMI of 25 to 29.9 is considered overweight.  A BMI of 30 and above is considered obese.  Maintain normal blood lipids and cholesterol levels by exercising and minimizing your intake of saturated fat. Eat a balanced diet with plenty of fruit and vegetables. Blood tests for lipids and cholesterol should begin at age 50 and be repeated every 5 years. If your lipid or cholesterol levels are high, you are over 50, or you are at high risk for heart disease, you may need your cholesterol levels checked more frequently.Ongoing high lipid and cholesterol levels should be treated with medicines if diet and exercise are not working.  If you smoke, find out from your health care provider how to quit. If you do not use tobacco, do not start.  Lung cancer screening is recommended for adults aged 59-80 years who are at high risk for developing lung cancer because of a history of smoking. A yearly low-dose CT scan of the lungs is recommended for people who have at least a 30-pack-year history of smoking and are a current smoker or have quit within the past 15 years. A pack year of smoking is smoking an average of 1 pack of cigarettes a day for 1 year (for example: 1 pack a day for 30 years or 2 packs a day for 15 years). Yearly screening should continue until the smoker has stopped smoking for  at least 15 years. Yearly screening should be stopped for people who develop a health problem that would prevent them from having lung cancer treatment.  If you choose to drink alcohol, do not have more than 2 drinks per day. One drink is considered to be 12 ounces (355 mL) of beer, 5 ounces (148 mL) of wine, or 1.5 ounces (44 mL) of liquor.  Avoid use of street drugs. Do not share needles with anyone. Ask for help if you need support or instructions about stopping the use of  drugs.  High blood pressure causes heart disease and increases the risk of stroke. Your blood pressure should be checked at least every 1-2 years. Ongoing high blood pressure should be treated with medicines, if weight loss and exercise are not effective.  If you are 69-7 years old, ask your health care provider if you should take aspirin to prevent heart disease.  Diabetes screening involves taking a blood sample to check your fasting blood sugar level. This should be done once every 3 years, after age 56, if you are within normal weight and without risk factors for diabetes. Testing should be considered at a younger age or be carried out more frequently if you are overweight and have at least 1 risk factor for diabetes.  Colorectal cancer can be detected and often prevented. Most routine colorectal cancer screening begins at the age of 32 and continues through age 13. However, your health care provider may recommend screening at an earlier age if you have risk factors for colon cancer. On a yearly basis, your health care provider may provide home test kits to check for hidden blood in the stool. Use of a small camera at the end of a tube to directly examine the colon (sigmoidoscopy or colonoscopy) can detect the earliest forms of colorectal cancer. Talk to your health care provider about this at age 48, when routine screening begins. Direct exam of the colon should be repeated every 5-10 years through age 27, unless early forms of precancerous polyps or small growths are found.  People who are at an increased risk for hepatitis B should be screened for this virus. You are considered at high risk for hepatitis B if:  You were born in a country where hepatitis B occurs often. Talk with your health care provider about which countries are considered high risk.  Your parents were born in a high-risk country and you have not received a shot to protect against hepatitis B (hepatitis B vaccine).  You have  HIV or AIDS.  You use needles to inject street drugs.  You live with, or have sex with, someone who has hepatitis B.  You are a man who has sex with other men (MSM).  You get hemodialysis treatment.  You take certain medicines for conditions such as cancer, organ transplantation, and autoimmune conditions.  Hepatitis C blood testing is recommended for all people born from [birth year] through 1965 and any individual with known risks for hepatitis C.  Practice safe sex. Use condoms and avoid high-risk sexual practices to reduce the spread of sexually transmitted infections (STIs). STIs include gonorrhea, chlamydia, syphilis, trichomonas, herpes, HPV, and human immunodeficiency virus (HIV). Herpes, HIV, and HPV are viral illnesses that have no cure. They can result in disability, cancer, and death.  If you are at risk of being infected with HIV, it is recommended that you take a prescription medicine daily to prevent HIV infection. This is called preexposure prophylaxis (PrEP). You are considered  at risk if:  You are a man who has sex with other men (MSM) and have other risk factors.  You are a heterosexual man, are sexually active, and are at increased risk for HIV infection.  You take drugs by injection.  You are sexually active with a partner who has HIV.  Talk with your health care provider about whether you are at high risk of being infected with HIV. If you choose to begin PrEP, you should first be tested for HIV. You should then be tested every 3 months for as long as you are taking PrEP.  A one-time screening for abdominal aortic aneurysm (AAA) and surgical repair of large AAAs by ultrasound are recommended for men ages 42 to 51 years who are current or former smokers.  Healthy men should no longer receive prostate-specific antigen (PSA) blood tests as part of routine cancer screening. Talk with your health care provider about prostate cancer screening.  Testicular cancer screening is  not recommended for adult males who have no symptoms. Screening includes self-exam, a health care provider exam, and other screening tests. Consult with your health care provider about any symptoms you have or any concerns you have about testicular cancer.  Use sunscreen. Apply sunscreen liberally and repeatedly throughout the day. You should seek shade when your shadow is shorter than you. Protect yourself by wearing long sleeves, pants, a wide-brimmed hat, and sunglasses year round, whenever you are outdoors.  Once a month, do a whole-body skin exam, using a mirror to look at the skin on your back. Tell your health care provider about new moles, moles that have irregular borders, moles that are larger than a pencil eraser, or moles that have changed in shape or color.  Stay current with required vaccines (immunizations).  Influenza vaccine. All adults should be immunized every year.  Tetanus, diphtheria, and acellular pertussis (Td, Tdap) vaccine. An adult who has not previously received Tdap or who does not know his vaccine status should receive 1 dose of Tdap. This initial dose should be followed by tetanus and diphtheria toxoids (Td) booster doses every 10 years. Adults with an unknown or incomplete history of completing a 3-dose immunization series with Td-containing vaccines should begin or complete a primary immunization series including a Tdap dose. Adults should receive a Td booster every 10 years.  Varicella vaccine. An adult without evidence of immunity to varicella should receive 2 doses or a second dose if he has previously received 1 dose.  Human papillomavirus (HPV) vaccine. Males aged 75-21 years who have not received the vaccine previously should receive the 3-dose series. Males aged 22-26 years may be immunized. Immunization is recommended through the age of 74 years for any male who has sex with males and did not get any or all doses earlier. Immunization is recommended for any  person with an immunocompromised condition through the age of 18 years if he did not get any or all doses earlier. During the 3-dose series, the second dose should be obtained 4-8 weeks after the first dose. The third dose should be obtained 24 weeks after the first dose and 16 weeks after the second dose.  Zoster vaccine. One dose is recommended for adults aged 11 years or older unless certain conditions are present.  Measles, mumps, and rubella (MMR) vaccine. Adults born before [birth year] generally are considered immune to measles and mumps. Adults born in [birth year] or later should have 1 or more doses of MMR vaccine unless there is a contraindication  to the vaccine or there is laboratory evidence of immunity to each of the three diseases. A routine second dose of MMR vaccine should be obtained at least 28 days after the first dose for students attending postsecondary schools, health care workers, or international travelers. People who received inactivated measles vaccine or an unknown type of measles vaccine during 1963-1967 should receive 2 doses of MMR vaccine. People who received inactivated mumps vaccine or an unknown type of mumps vaccine before 1979 and are at high risk for mumps infection should consider immunization with 2 doses of MMR vaccine. Unvaccinated health care workers born before [birth year] who lack laboratory evidence of measles, mumps, or rubella immunity or laboratory confirmation of disease should consider measles and mumps immunization with 2 doses of MMR vaccine or rubella immunization with 1 dose of MMR vaccine.  Pneumococcal 13-valent conjugate (PCV13) vaccine. When indicated, a person who is uncertain of his immunization history and has no record of immunization should receive the PCV13 vaccine. An adult aged 75 years or older who has certain medical conditions and has not been previously immunized should receive 1 dose of PCV13 vaccine. This PCV13 should be followed with a dose of pneumococcal  polysaccharide (PPSV23) vaccine. The PPSV23 vaccine dose should be obtained at least 8 weeks after the dose of PCV13 vaccine. An adult aged 39 years or older who has certain medical conditions and previously received 1 or more doses of PPSV23 vaccine should receive 1 dose of PCV13. The PCV13 vaccine dose should be obtained 1 or more years after the last PPSV23 vaccine dose.  Pneumococcal polysaccharide (PPSV23) vaccine. When PCV13 is also indicated, PCV13 should be obtained first. All adults aged 32 years and older should be immunized. An adult younger than age 43 years who has certain medical conditions should be immunized. Any person who resides in a nursing home or long-term care facility should be immunized. An adult smoker should be immunized. People with an immunocompromised condition and certain other conditions should receive both PCV13 and PPSV23 vaccines. People with human immunodeficiency virus (HIV) infection should be immunized as soon as possible after diagnosis. Immunization during chemotherapy or radiation therapy should be avoided. Routine use of PPSV23 vaccine is not recommended for American Indians, [place] Natives, or people younger than 65 years unless there are medical conditions that require PPSV23 vaccine. When indicated, people who have unknown immunization and have no record of immunization should receive PPSV23 vaccine. One-time revaccination 5 years after the first dose of PPSV23 is recommended for people aged 19-64 years who have chronic kidney failure, nephrotic syndrome, asplenia, or immunocompromised conditions. People who received 1-2 doses of PPSV23 before age 71 years should receive another dose of PPSV23 vaccine at age 60 years or later if at least 5 years have passed since the previous dose. Doses of PPSV23 are not needed for people immunized with PPSV23 at or after age 23 years.  Meningococcal vaccine. Adults with asplenia or persistent complement component deficiencies  should receive 2 doses of quadrivalent meningococcal conjugate (MenACWY-D) vaccine. The doses should be obtained at least 2 months apart. Microbiologists working with certain meningococcal bacteria, [employer] [place] recruits, people at risk during an outbreak, and people who travel to or live in countries with a high rate of meningitis should be immunized. A first-year college student up through age 14 years who is living in a residence hall should receive a dose if he did not receive a dose on or after his 16th birthday. Adults who have certain high-risk  conditions should receive one or more doses of vaccine.  Hepatitis A vaccine. Adults who wish to be protected from this disease, have certain high-risk conditions, work with hepatitis A-infected animals, work in hepatitis A research labs, or travel to or work in countries with a high rate of hepatitis A should be immunized. Adults who were previously unvaccinated and who anticipate close contact with an international adoptee during the first 60 days after arrival in the [place] from a country with a high rate of hepatitis A should be immunized.  Hepatitis B vaccine. Adults should be immunized if they wish to be protected from this disease, have certain high-risk conditions, may be exposed to blood or other infectious body fluids, are household contacts or sex partners of hepatitis B positive people, are clients or workers in certain care facilities, or travel to or work in countries with a high rate of hepatitis B.  Haemophilus influenzae type b (Hib) vaccine. A previously unvaccinated person with asplenia or sickle cell disease or having a scheduled splenectomy should receive 1 dose of Hib vaccine. Regardless of previous immunization, a recipient of a hematopoietic stem cell transplant should receive a 3-dose series 6-12 months after his successful transplant. Hib vaccine is not recommended for adults with HIV infection. Preventive Service / Frequency Ages  27 to 59  Blood pressure check.** / Every 1 to 2 years.  Lipid and cholesterol check.** / Every 5 years beginning at age 68.  Hepatitis C blood test.** / For any individual with known risks for hepatitis C.  Skin self-exam. / Monthly.  Influenza vaccine. / Every year.  Tetanus, diphtheria, and acellular pertussis (Tdap, Td) vaccine.** / Consult your health care provider. 1 dose of Td every 10 years.  Varicella vaccine.** / Consult your health care provider.  HPV vaccine. / 3 doses over 6 months, if 104 or younger.  Measles, mumps, rubella (MMR) vaccine.** / You need at least 1 dose of MMR if you were born in 1957 or later. You may also need a second dose.  Pneumococcal 13-valent conjugate (PCV13) vaccine.** / Consult your health care provider.  Pneumococcal polysaccharide (PPSV23) vaccine.** / 1 to 2 doses if you smoke cigarettes or if you have certain conditions.  Meningococcal vaccine.** / 1 dose if you are age 69 to 61 years and a Market researcher living in a residence hall, or have one of several medical conditions. You may also need additional booster doses.  Hepatitis A vaccine.** / Consult your health care provider.  Hepatitis B vaccine.** / Consult your health care provider.  Haemophilus influenzae type b (Hib) vaccine.** / Consult your health care provider. Ages 41 to 39  Blood pressure check.** / Every 1 to 2 years.  Lipid and cholesterol check.** / Every 5 years beginning at age 56.  Lung cancer screening. / Every year if you are aged 56-80 years and have a 30-pack-year history of smoking and currently smoke or have quit within the past 15 years. Yearly screening is stopped once you have quit smoking for at least 15 years or develop a health problem that would prevent you from having lung cancer treatment.  Fecal occult blood test (FOBT) of stool. / Every year beginning at age 73 and continuing until age 43. You may not have to do this test if you get a  colonoscopy every 10 years.  Flexible sigmoidoscopy** or colonoscopy.** / Every 5 years for a flexible sigmoidoscopy or every 10 years for a colonoscopy beginning at age 22  and continuing until age 3.  Hepatitis C blood test.** / For all people born from [birth year] through 1965 and any individual with known risks for hepatitis C.  Skin self-exam. / Monthly.  Influenza vaccine. / Every year.  Tetanus, diphtheria, and acellular pertussis (Tdap/Td) vaccine.** / Consult your health care provider. 1 dose of Td every 10 years.  Varicella vaccine.** / Consult your health care provider.  Zoster vaccine.** / 1 dose for adults aged 22 years or older.  Measles, mumps, rubella (MMR) vaccine.** / You need at least 1 dose of MMR if you were born in 1957 or later. You may also need a second dose.  Pneumococcal 13-valent conjugate (PCV13) vaccine.** / Consult your health care provider.  Pneumococcal polysaccharide (PPSV23) vaccine.** / 1 to 2 doses if you smoke cigarettes or if you have certain conditions.  Meningococcal vaccine.** / Consult your health care provider.  Hepatitis A vaccine.** / Consult your health care provider.  Hepatitis B vaccine.** / Consult your health care provider.  Haemophilus influenzae type b (Hib) vaccine.** / Consult your health care provider. Ages 15 and over  Blood pressure check.** / Every 1 to 2 years.  Lipid and cholesterol check.**/ Every 5 years beginning at age 71.  Lung cancer screening. / Every year if you are aged 19-80 years and have a 30-pack-year history of smoking and currently smoke or have quit within the past 15 years. Yearly screening is stopped once you have quit smoking for at least 15 years or develop a health problem that would prevent you from having lung cancer treatment.  Fecal occult blood test (FOBT) of stool. / Every year beginning at age 46 and continuing until age 89. You may not have to do this test if you get a colonoscopy every 10  years.  Flexible sigmoidoscopy** or colonoscopy.** / Every 5 years for a flexible sigmoidoscopy or every 10 years for a colonoscopy beginning at age 100 and continuing until age 77.  Hepatitis C blood test.** / For all people born from [birth year] through 1963 and any individual with known risks for hepatitis C.  Abdominal aortic aneurysm (AAA) screening.** / A one-time screening for ages 50 to 13 years who are current or former smokers.  Skin self-exam. / Monthly.  Influenza vaccine. / Every year.  Tetanus, diphtheria, and acellular pertussis (Tdap/Td) vaccine.** / 1 dose of Td every 10 years.  Varicella vaccine.** / Consult your health care provider.  Zoster vaccine.** / 1 dose for adults aged 50 years or older.  Pneumococcal 13-valent conjugate (PCV13) vaccine.** / Consult your health care provider.  Pneumococcal polysaccharide (PPSV23) vaccine.** / 1 dose for all adults aged 32 years and older.  Meningococcal vaccine.** / Consult your health care provider.  Hepatitis A vaccine.** / Consult your health care provider.  Hepatitis B vaccine.** / Consult your health care provider.  Haemophilus influenzae type b (Hib) vaccine.** / Consult your health care provider. **Family history and personal history of risk and conditions may change your health care provider's recommendations. Document Released: 06/06/2001 Document Revised: 04/15/2013 Document Reviewed: 09/05/2010 [facility] Patient Information 2015 [place], [place]. This information is not intended to replace advice given to you by your health care provider. Make sure you discuss any questions you have with your health care provider.

## 2014-02-05 ENCOUNTER — Telehealth: Payer: Self-pay | Admitting: Physician Assistant

## 2014-02-05 NOTE — Telephone Encounter (Signed)
Called and left message on patient's cell phone for him to return call concerning lab results. JG//CMA

## 2014-02-05 NOTE — Telephone Encounter (Signed)
Labs look good overall.  Cholesterol is elevated significantly with LDL at 193.  Goal is 100-129 at present.  Need to restart medication.  I would like to send in Simvastatin (Zocor) 20 mg daily to lower cholesterol. Also patient should decrease intake of foods high in cholesterol and saturated fats.  Increase exercise. Please let me know if patient is willing to take medication, and I will send in to his pharmacy with proper dosing instructions

## 2014-02-08 NOTE — Telephone Encounter (Signed)
Please attempt to recontact patient for results.

## 2014-02-10 NOTE — Telephone Encounter (Signed)
LMOM [2nd] with contact name and number for return call RE: results and further provider instructions/SLS

## 2014-02-10 NOTE — Telephone Encounter (Signed)
Caller name: Srihan  Call back [phone]   Reason for call:  Called back.  Please return call

## 2014-02-13 NOTE — Telephone Encounter (Signed)
LMOM [3rd] with contact name and number for return call RE: results and further provider instructions/SLS

## 2014-02-16 ENCOUNTER — Telehealth: Payer: Self-pay | Admitting: Physician Assistant

## 2014-02-16 ENCOUNTER — Encounter: Payer: Self-pay | Admitting: *Deleted

## 2014-02-16 NOTE — Telephone Encounter (Signed)
Please inform patient that his UDS results are in.  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  Results have been verified by the Toxicology people we use.  He has never ellicited use of these controlled medications.  Unfortunately, we will no longer be able to prescribe controlled substances to him, including Xanx.

## 2014-02-16 NOTE — Telephone Encounter (Signed)
Letter Mailed

## 2014-02-18 NOTE — Telephone Encounter (Signed)
Letter Mailed with provider information.

## 2014-02-23 ENCOUNTER — Telehealth: Payer: Self-pay | Admitting: Physician Assistant

## 2014-02-23 NOTE — Telephone Encounter (Signed)
LETTER MAILED last week after numerous attempts to reach patient via telephone/sls

## 2014-02-23 NOTE — Telephone Encounter (Signed)
Pt returning call back, inquiring about test result

## 2014-02-24 ENCOUNTER — Telehealth: Payer: Self-pay | Admitting: Physician Assistant

## 2014-02-24 MED ORDER — PRAVASTATIN SODIUM 20 MG PO TABS: 20.0000 mg | ORAL_TABLET | Freq: Every day | ORAL | Status: DC

## 2014-02-24 NOTE — Telephone Encounter (Signed)
Caller name: Zakkery, Dorian Relation to pt: self  Call back number: [phone] [name] [phone]  Reason for call:   Pt states he is willing to start medication Simvastatin (Zocor) 20 mg as per your recommendation, please send to pharmacy. Pt has been advised regarding the letter sent out.

## 2014-02-24 NOTE — Telephone Encounter (Signed)
Drug Interaction on Simvastatin with Diltiazem dosage; provider informed and per VO changed to Pravastatin 20 mg daily, Rx to pharmacy. Patient informed, understood & agreed/SLS

## 2014-03-09 ENCOUNTER — Ambulatory Visit: Payer: BC Managed Care – PPO | Admitting: Internal Medicine

## 2014-04-06 ENCOUNTER — Other Ambulatory Visit: Payer: Self-pay | Admitting: Physician Assistant

## 2014-04-07 NOTE — Telephone Encounter (Signed)
Medication Detail      Disp Refills Start End     tamsulosin (FLOMAX) 0.4 MG CAPS capsule 30 capsule 3 12/24/2013     Sig - Route: Take 1 capsule (0.4 mg total) by mouth daily after supper. - Oral    E-Prescribing Status: Receipt confirmed by pharmacy (12/24/2013 3:49 PM EDT)    Patient not due for refill until 01.01.16; Hold Rx until due per provider/SLS

## 2014-04-10 ENCOUNTER — Other Ambulatory Visit: Payer: Self-pay | Admitting: Physician Assistant

## 2014-04-10 NOTE — Telephone Encounter (Signed)
Med filled.  

## 2014-04-13 NOTE — Telephone Encounter (Signed)
Rx request to pharmacy/SLS  

## 2014-05-05 ENCOUNTER — Other Ambulatory Visit: Payer: Self-pay | Admitting: Physician Assistant

## 2014-05-05 NOTE — Telephone Encounter (Addendum)
Medication Detail      Disp Refills Start End     pravastatin (PRAVACHOL) 20 MG tablet 30 tablet 2 02/24/2014     Sig - Route: Take 1 tablet (20 mg total) by mouth daily. - Oral    E-Prescribing Status: Receipt confirmed by pharmacy (02/24/2014 5:22 PM EST)      Rx request Denied; Last Rx to pharmacy 11.03.15, #30 with 2 refills available-Too Soon/SLS

## 2014-06-10 ENCOUNTER — Other Ambulatory Visit: Payer: Self-pay | Admitting: Physician Assistant

## 2014-06-10 NOTE — Telephone Encounter (Signed)
Rx request to pharmacy/SLS  

## 2014-06-13 ENCOUNTER — Other Ambulatory Visit: Payer: Self-pay | Admitting: Physician Assistant

## 2014-06-15 NOTE — Telephone Encounter (Signed)
Rx request to pharmacy/SLS Requested drug refills are authorized, however, the patient needs further evaluation and/or laboratory testing before further refills are given. Ask him to make an appointment for this.  

## 2014-07-28 ENCOUNTER — Other Ambulatory Visit: Payer: Self-pay | Admitting: Physician Assistant

## 2014-08-11 ENCOUNTER — Other Ambulatory Visit: Payer: Self-pay | Admitting: Physician Assistant

## 2014-08-28 ENCOUNTER — Other Ambulatory Visit: Payer: Self-pay | Admitting: Physician Assistant

## 2014-09-16 ENCOUNTER — Encounter: Payer: Self-pay | Admitting: Physician Assistant

## 2014-09-16 ENCOUNTER — Ambulatory Visit (INDEPENDENT_AMBULATORY_CARE_PROVIDER_SITE_OTHER): Payer: Self-pay | Admitting: Physician Assistant

## 2014-09-16 VITALS — BP 149/76 | HR 100 | Temp 98.1°F | Ht 70.5 in | Wt 249.0 lb

## 2014-09-16 DIAGNOSIS — F1920 Other psychoactive substance dependence, uncomplicated: Secondary | ICD-10-CM

## 2014-09-16 DIAGNOSIS — J309 Allergic rhinitis, unspecified: Secondary | ICD-10-CM

## 2014-09-16 NOTE — Patient Instructions (Signed)
Please stay well hydrated and use saline nasal spray to help with congestion. Use your Flonase daily. Resume your Zyrtec-D daily.  Follow-up in 3 weeks.

## 2014-09-16 NOTE — Progress Notes (Signed)
Patient presents to clinic today to discuss management of allergy symptoms.  Patient endorses nasal congestion and sinus drainage which is worse at night.  Is taking Flonase daily.  Patient denies sinus pain, ear pain, fever or cough. Having increased rhinorrhea and sneezing.  Is using Afrin daily and has been over the past two weeks.   Patient would also like to discuss concerns of drug abuse -- has been abusing friends pain medications without prescription.  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  As such patient is no longer allowed to receive pain medications from this office.  Is trying to wean himself off of medications.  Has not attempted to reach out to support groups in the area or a rehabilitation facility.  Past Medical History  Diagnosis Date  . Hypertension   . Unspecified intestinal obstruction   . Unspecified asthma(493.90)   . Chest pain, atypical   . Nontoxic multinodular goiter   . Painful respiration   . Dyspnea on exertion   . Trigeminal neuralgia   . Cluster headache   . Hyperlipidemia   . GERD (gastroesophageal reflux disease)   . Complication of anesthesia     difficult waking up"  . Food poisoning 11/2011  . Multiple thyroid nodules   . Hiatal hernia     Current Outpatient Prescriptions on File Prior to Visit  Medication Sig Dispense Refill  . aspirin 81 MG EC tablet Take 81 mg by mouth daily.      Marland Kitchen diltiazem (TIAZAC) 360 MG 24 hr capsule TAKE 1 CAPSULE (360 MG TOTAL) BY MOUTH DAILY. 30 capsule 3  . fluticasone (FLONASE) 50 MCG/ACT nasal spray Place 2 sprays into both nostrils daily. 16 g 6  . lansoprazole (PREVACID) 15 MG capsule TAKE 1 CAPSULE (15 MG TOTAL) BY MOUTH DAILY AT 12 NOON. 30 capsule 3  . lansoprazole (PREVACID) 30 MG capsule TAKE 1 CAPSULE (30 MG TOTAL) BY MOUTH DAILY. 30 capsule 1  . levothyroxine (SYNTHROID, LEVOTHROID) 25 MCG tablet TAKE 1 TABLET (25 MCG TOTAL) BY MOUTH DAILY. 30 tablet 0  . pravastatin (PRAVACHOL) 20 MG  tablet TAKE 1 TABLET (20 MG TOTAL) BY MOUTH DAILY. NEED APPOINTMENT FOR MORE REFILLS 30 tablet 0  . tamsulosin (FLOMAX) 0.4 MG CAPS capsule TAKE 1 CAPSULE (0.4 MG TOTAL) BY MOUTH DAILY AFTER SUPPER. 30 capsule 0   No current facility-administered medications on file prior to visit.    No Known Allergies  Family History  Problem Relation Age of Onset  . Diabetes Mother   . Hyperlipidemia Mother   . Hypertension Mother   . Hypertension Father   . Heart disease Father     CAD/CHF  . Cancer Father     Mesothelioma  . Heart disease Maternal Aunt   . Prostate cancer Neg Hx   . Colon cancer Neg Hx     History   Social History  . Marital Status: Married    Spouse Name: N/A  . Number of Children: N/A  . 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 Smoking status: Current Every Day Smoker -- 0.25 packs/day for 18 years    Types: Cigarettes  . Smokeless tobacco: Never Used     Comment: 07/10/13 1/2 ppd  . Alcohol Use: No     Comment: rare  . Drug Use: No  . Sexual Activity: Not Currently   Other Topics Concern  .  None   Social History Narrative   HSG   Culinary school in [place]   Married - '84 - 5 years divorced; remarried '96   1 son - '85      Regular Exercise -  NO         Review of Systems - See HPI.  All other ROS are negative.  BP 149/76 mmHg  Pulse 100  Temp(Src) 98.1 F (36.7 C) (Oral)  Ht 5' 10.5" (1.791 m)  Wt 249 lb (112.946 kg)  BMI 35.21 kg/m2  SpO2 100%  Physical Exam  Constitutional: He is oriented to person, place, and time and well-developed, well-nourished, and in no distress.  HENT:  Head: Normocephalic and atraumatic.  Eyes: Conjunctivae are normal.  Neck: Neck supple.  Cardiovascular: Normal rate, regular rhythm, normal heart sounds and intact distal pulses.   Pulmonary/Chest: Effort normal and breath sounds normal. No respiratory distress. He has no wheezes. He  has no rales. He exhibits no tenderness.  Neurological: He is alert and oriented to person, place, and time.  Skin: Skin is warm and dry. No rash noted.  Psychiatric: Affect normal.  Vitals reviewed.  Assessment/Plan: Rhinitis, allergic Stop Afrin.  Resume Flonase ans Claritin daily.  Saline nasal spray and increased hydration.  Follow-up 2 weeks.   Drug abuse and dependence Patient endorsed. Has also failed drug screen prior.  Is taking prescription narcotic pain medications that are not prescribed to him.  Discussed treatment options and support group.  Handout given with local organizations.  Encouraged pain management referral to help.  Patient will give some thought.

## 2014-09-16 NOTE — Progress Notes (Signed)
Pre visit review using our clinic review tool, if applicable. No additional management support is needed unless otherwise documented below in the visit note. 

## 2014-09-18 DIAGNOSIS — J309 Allergic rhinitis, unspecified: Secondary | ICD-10-CM | POA: Insufficient documentation

## 2014-09-18 DIAGNOSIS — F1920 Other psychoactive substance dependence, uncomplicated: Secondary | ICD-10-CM | POA: Insufficient documentation

## 2014-09-18 NOTE — Assessment & Plan Note (Signed)
Stop Afrin.  Resume Flonase ans Claritin daily.  Saline nasal spray and increased hydration.  Follow-up 2 weeks.

## 2014-09-18 NOTE — Assessment & Plan Note (Signed)
Patient endorsed. Has also failed drug screen prior.  Is taking prescription narcotic pain medications that are not prescribed to him.  Discussed treatment options and support group.  Handout given with local organizations.  Encouraged pain management referral to help.  Patient will give some thought.

## 2014-10-19 ENCOUNTER — Observation Stay (HOSPITAL_BASED_OUTPATIENT_CLINIC_OR_DEPARTMENT_OTHER): Payer: BLUE CROSS/BLUE SHIELD

## 2014-10-19 ENCOUNTER — Observation Stay (HOSPITAL_BASED_OUTPATIENT_CLINIC_OR_DEPARTMENT_OTHER)
Admission: EM | Admit: 2014-10-19 | Discharge: 2014-10-21 | Disposition: A | Discharge status: Home / Self Care | Payer: BLUE CROSS/BLUE SHIELD | Attending: Internal Medicine | Admitting: Internal Medicine

## 2014-10-19 ENCOUNTER — Encounter (HOSPITAL_COMMUNITY): Payer: Self-pay | Admitting: *Deleted

## 2014-10-19 ENCOUNTER — Observation Stay (HOSPITAL_COMMUNITY): Payer: BLUE CROSS/BLUE SHIELD

## 2014-10-19 ENCOUNTER — Emergency Department (HOSPITAL_BASED_OUTPATIENT_CLINIC_OR_DEPARTMENT_OTHER): Payer: BLUE CROSS/BLUE SHIELD

## 2014-10-19 DIAGNOSIS — R079 Chest pain, unspecified: Secondary | ICD-10-CM | POA: Diagnosis not present

## 2014-10-19 DIAGNOSIS — R7989 Other specified abnormal findings of blood chemistry: Secondary | ICD-10-CM | POA: Insufficient documentation

## 2014-10-19 DIAGNOSIS — Z720 Tobacco use: Secondary | ICD-10-CM | POA: Diagnosis not present

## 2014-10-19 DIAGNOSIS — E039 Hypothyroidism, unspecified: Secondary | ICD-10-CM | POA: Diagnosis not present

## 2014-10-19 DIAGNOSIS — N401 Enlarged prostate with lower urinary tract symptoms: Secondary | ICD-10-CM | POA: Diagnosis present

## 2014-10-19 DIAGNOSIS — E785 Hyperlipidemia, unspecified: Secondary | ICD-10-CM | POA: Insufficient documentation

## 2014-10-19 DIAGNOSIS — F17210 Nicotine dependence, cigarettes, uncomplicated: Secondary | ICD-10-CM | POA: Diagnosis not present

## 2014-10-19 DIAGNOSIS — Z79899 Other long term (current) drug therapy: Secondary | ICD-10-CM | POA: Diagnosis not present

## 2014-10-19 DIAGNOSIS — N400 Enlarged prostate without lower urinary tract symptoms: Secondary | ICD-10-CM | POA: Diagnosis not present

## 2014-10-19 DIAGNOSIS — Z6833 Body mass index (BMI) 33.0-33.9, adult: Secondary | ICD-10-CM | POA: Diagnosis not present

## 2014-10-19 DIAGNOSIS — Z96611 Presence of right artificial shoulder joint: Secondary | ICD-10-CM | POA: Diagnosis not present

## 2014-10-19 DIAGNOSIS — E041 Nontoxic single thyroid nodule: Secondary | ICD-10-CM | POA: Insufficient documentation

## 2014-10-19 DIAGNOSIS — I10 Essential (primary) hypertension: Secondary | ICD-10-CM | POA: Diagnosis not present

## 2014-10-19 DIAGNOSIS — E669 Obesity, unspecified: Secondary | ICD-10-CM | POA: Insufficient documentation

## 2014-10-19 DIAGNOSIS — Z7982 Long term (current) use of aspirin: Secondary | ICD-10-CM | POA: Insufficient documentation

## 2014-10-19 DIAGNOSIS — G500 Trigeminal neuralgia: Secondary | ICD-10-CM | POA: Diagnosis not present

## 2014-10-19 DIAGNOSIS — I2510 Atherosclerotic heart disease of native coronary artery without angina pectoris: Secondary | ICD-10-CM | POA: Diagnosis not present

## 2014-10-19 DIAGNOSIS — F419 Anxiety disorder, unspecified: Secondary | ICD-10-CM | POA: Insufficient documentation

## 2014-10-19 DIAGNOSIS — E780 Pure hypercholesterolemia: Secondary | ICD-10-CM | POA: Insufficient documentation

## 2014-10-19 DIAGNOSIS — R911 Solitary pulmonary nodule: Secondary | ICD-10-CM | POA: Diagnosis not present

## 2014-10-19 DIAGNOSIS — R351 Nocturia: Secondary | ICD-10-CM | POA: Diagnosis not present

## 2014-10-19 DIAGNOSIS — K449 Diaphragmatic hernia without obstruction or gangrene: Secondary | ICD-10-CM | POA: Insufficient documentation

## 2014-10-19 DIAGNOSIS — J45909 Unspecified asthma, uncomplicated: Secondary | ICD-10-CM | POA: Diagnosis not present

## 2014-10-19 DIAGNOSIS — R0789 Other chest pain: Secondary | ICD-10-CM | POA: Diagnosis present

## 2014-10-19 DIAGNOSIS — Z8249 Family history of ischemic heart disease and other diseases of the circulatory system: Secondary | ICD-10-CM | POA: Diagnosis not present

## 2014-10-19 DIAGNOSIS — E042 Nontoxic multinodular goiter: Secondary | ICD-10-CM | POA: Insufficient documentation

## 2014-10-19 DIAGNOSIS — K219 Gastro-esophageal reflux disease without esophagitis: Secondary | ICD-10-CM | POA: Insufficient documentation

## 2014-10-19 DIAGNOSIS — R072 Precordial pain: Secondary | ICD-10-CM | POA: Diagnosis not present

## 2014-10-19 DIAGNOSIS — J40 Bronchitis, not specified as acute or chronic: Secondary | ICD-10-CM | POA: Diagnosis present

## 2014-10-19 LAB — BASIC METABOLIC PANEL
Anion gap: 13 (ref 5–15)
BUN: 9 mg/dL (ref 6–20)
CO2: 30 mmol/L (ref 22–32)
Calcium: 9.4 mg/dL (ref 8.9–10.3)
Chloride: 96 mmol/L — ABNORMAL LOW (ref 101–111)
Creatinine, Ser: 0.9 mg/dL (ref 0.61–1.24)
GFR calc Af Amer: >60 mL/min (ref >60)
GFR calc non Af Amer: >60 mL/min (ref >60)
Glucose, Bld: 147 mg/dL — ABNORMAL HIGH (ref 65–99)
Potassium: 3.6 mmol/L (ref 3.5–5.1)
Sodium: 139 mmol/L (ref 135–145)

## 2014-10-19 LAB — CBC WITH DIFFERENTIAL/PLATELET
Basophils Absolute: 0 10*3/uL (ref 0.0–0.1)
Basophils Relative: 0 % (ref 0–1)
Eosinophils Absolute: 0.1 10*3/uL (ref 0.0–0.7)
Eosinophils Relative: 1 % (ref 0–5)
HCT: 44.2 % (ref 39.0–52.0)
Hemoglobin: 15.1 g/dL (ref 13.0–17.0)
Lymphocytes Relative: 32 % (ref 12–46)
Lymphs Abs: 3.5 10*3/uL (ref 0.7–4.0)
MCH: 30.9 pg (ref 26.0–34.0)
MCHC: 34.2 g/dL (ref 30.0–36.0)
MCV: 90.4 fL (ref 78.0–100.0)
Monocytes Absolute: 1.1 10*3/uL — ABNORMAL HIGH (ref 0.1–1.0)
Monocytes Relative: 11 % (ref 3–12)
Neutro Abs: 6.1 10*3/uL (ref 1.7–7.7)
Neutrophils Relative %: 56 % (ref 43–77)
Platelets: 170 10*3/uL (ref 150–400)
RBC: 4.89 MIL/uL (ref 4.22–5.81)
RDW: 12.9 % (ref 11.5–15.5)
WBC: 10.8 10*3/uL — ABNORMAL HIGH (ref 4.0–10.5)

## 2014-10-19 LAB — TROPONIN I
Troponin I: <0.03 ng/mL (ref <0.031)
Troponin I: <0.03 ng/mL (ref <0.031)
Troponin I: <0.03 ng/mL (ref <0.031)

## 2014-10-19 LAB — D-DIMER, QUANTITATIVE: D-Dimer, Quant: 2.22 ug/mL-FEU — ABNORMAL HIGH (ref 0.00–0.48)

## 2014-10-19 MED ORDER — ENOXAPARIN SODIUM 40 MG/0.4ML ~~LOC~~ SOLN
40.0000 mg | SUBCUTANEOUS | Status: DC
  Filled 2014-10-19 (×3): qty 0.4

## 2014-10-19 MED ORDER — ZOLPIDEM TARTRATE 5 MG PO TABS: 5.0000 mg | ORAL_TABLET | Freq: Every evening | ORAL | Status: DC | PRN

## 2014-10-19 MED ORDER — ONDANSETRON HCL 4 MG/2ML IJ SOLN: 4.0000 mg | Freq: Four times a day (QID) | INTRAMUSCULAR | Status: DC | PRN

## 2014-10-19 MED ORDER — IOHEXOL 350 MG/ML SOLN
100.0000 mL | Freq: Once | INTRAVENOUS | Status: AC | PRN
  Administered 2014-10-19: 100 mL via INTRAVENOUS

## 2014-10-19 MED ORDER — MORPHINE SULFATE 4 MG/ML IJ SOLN
4.0000 mg | Freq: Once | INTRAMUSCULAR | Status: AC
  Administered 2014-10-19: 4 mg via INTRAVENOUS
  Filled 2014-10-19: qty 1

## 2014-10-19 MED ORDER — TAMSULOSIN HCL 0.4 MG PO CAPS
0.4000 mg | ORAL_CAPSULE | Freq: Every day | ORAL | Status: DC
  Administered 2014-10-19 – 2014-10-20 (×2): 0.4 mg via ORAL
  Filled 2014-10-19 (×3): qty 1

## 2014-10-19 MED ORDER — NITROGLYCERIN 0.4 MG SL SUBL
0.4000 mg | SUBLINGUAL_TABLET | SUBLINGUAL | Status: AC | PRN
  Administered 2014-10-19 (×3): 0.4 mg via SUBLINGUAL
  Filled 2014-10-19: qty 1

## 2014-10-19 MED ORDER — SODIUM CHLORIDE 0.9 % IV SOLN
Freq: Once | INTRAVENOUS | Status: AC
  Administered 2014-10-19: 09:00:00 via INTRAVENOUS

## 2014-10-19 MED ORDER — DILTIAZEM HCL ER BEADS 240 MG PO CP24
360.0000 mg | ORAL_CAPSULE | Freq: Every day | ORAL | Status: DC
  Filled 2014-10-19 (×3): qty 1

## 2014-10-19 MED ORDER — ALPRAZOLAM 0.25 MG PO TABS: 0.2500 mg | ORAL_TABLET | Freq: Two times a day (BID) | ORAL | Status: DC | PRN

## 2014-10-19 MED ORDER — NITROGLYCERIN 2 % TD OINT
1.0000 [in_us] | TOPICAL_OINTMENT | Freq: Once | TRANSDERMAL | Status: AC
  Administered 2014-10-19: 1 [in_us] via TOPICAL
  Filled 2014-10-19: qty 1

## 2014-10-19 MED ORDER — ASPIRIN 81 MG PO CHEW
324.0000 mg | CHEWABLE_TABLET | Freq: Once | ORAL | Status: AC
Start: 2014-10-19 — End: 2014-10-19
  Administered 2014-10-19: 324 mg via ORAL
  Filled 2014-10-19: qty 4

## 2014-10-19 MED ORDER — ASPIRIN EC 325 MG PO TBEC
325.0000 mg | DELAYED_RELEASE_TABLET | Freq: Every day | ORAL | Status: DC
  Administered 2014-10-20 – 2014-10-21 (×2): 325 mg via ORAL
  Filled 2014-10-19 (×3): qty 1

## 2014-10-19 MED ORDER — PANTOPRAZOLE SODIUM 20 MG PO TBEC
20.0000 mg | DELAYED_RELEASE_TABLET | Freq: Every day | ORAL | Status: DC
  Administered 2014-10-20 – 2014-10-21 (×2): 20 mg via ORAL
  Filled 2014-10-19 (×3): qty 1

## 2014-10-19 MED ORDER — MORPHINE SULFATE 2 MG/ML IJ SOLN
2.0000 mg | INTRAMUSCULAR | Status: DC | PRN
  Administered 2014-10-19 – 2014-10-21 (×6): 2 mg via INTRAVENOUS
  Filled 2014-10-19 (×6): qty 1

## 2014-10-19 MED ORDER — LEVOTHYROXINE SODIUM 25 MCG PO TABS
25.0000 ug | ORAL_TABLET | Freq: Every day | ORAL | Status: DC
Start: 2014-10-20 — End: 2014-10-21
  Administered 2014-10-20 – 2014-10-21 (×2): 25 ug via ORAL
  Filled 2014-10-19 (×4): qty 1

## 2014-10-19 MED ORDER — PRAVASTATIN SODIUM 20 MG PO TABS
20.0000 mg | ORAL_TABLET | Freq: Every day | ORAL | Status: DC
Start: 2014-10-19 — End: 2014-10-20
  Administered 2014-10-19: 20 mg via ORAL
  Filled 2014-10-19 (×2): qty 1

## 2014-10-19 MED ORDER — GI COCKTAIL ~~LOC~~
30.0000 mL | Freq: Four times a day (QID) | ORAL | Status: DC | PRN
  Administered 2014-10-19 – 2014-10-20 (×2): 30 mL via ORAL
  Filled 2014-10-19 (×3): qty 30

## 2014-10-19 MED ORDER — ACETAMINOPHEN 325 MG PO TABS: 650.0000 mg | ORAL_TABLET | ORAL | Status: DC | PRN

## 2014-10-19 MED ORDER — FLUTICASONE PROPIONATE 50 MCG/ACT NA SUSP
2.0000 | Freq: Every day | NASAL | Status: DC
  Administered 2014-10-20: 2 via NASAL
  Filled 2014-10-19: qty 16

## 2014-10-19 NOTE — Progress Notes (Signed)
Echocardiogram 2D Echocardiogram has been performed.  Tresa Res 10/19/2014, 2:37 PM

## 2014-10-19 NOTE — ED Notes (Signed)
Pt care transferred to [facility] staff at bedside. Report to [place], rn [facility]. Pt states he has notified his family of his pending transport and inpatient admit to [facility] room #9.

## 2014-10-19 NOTE — Progress Notes (Signed)
54 year old male presented in [facility] for chest pain, troponin EKG stable, patient is being transferred for observation, heart score is 4, history of hypertension, diabetes, smoker, may need a stress test prior to discharge Consult cardiology upon arrival, keep nothing by mouth

## 2014-10-19 NOTE — Progress Notes (Signed)
New pt admission from [facility]. Pt brought to the floor in stable condition. Vitals taken. Initial Assessment done. All immediate pertinent needs to patient addressed. Patient Guide given to patient. Important safety instructions relating to hospitalization reviewed with patient. Patient verbalized understanding. Will continue to monitor pt. Paged admitting Hospitalist.  Maurene Capes RN

## 2014-10-19 NOTE — Progress Notes (Signed)
Pt brought down for Cardiac MRI.  Pt was put on table and when put in scanner he was severely claustrophobic and refused exam.  Michela Pitcher he would only go to an open, unfortunately Open MRI's cannot perform cardiac exams.  Dr Meda Coffee notified by telephone.

## 2014-10-19 NOTE — ED Notes (Signed)
Pt reports chest pain x yesterday, "aching deep in my chest" indicates left chest, left rib area, non-tender, pt states pain increases with deep inspiration. ekg performed, iv access obtained while pt being triaged.

## 2014-10-19 NOTE — Consult Note (Addendum)
CARDIOLOGY CONSULT NOTE   Patient ID: [name] MRN: [id number], DOB/AGE: 1960/11/08   Admit date: 10/19/2014 Date of Consult: 10/19/2014   Primary Physician: Leeanne Rio, PA-C Primary Cardiologist: new (remotely cathed by Dr. Percival Spanish in 12/2008)  Pt. Profile  54 year old African-American male was past medical history of HTN, HLD, hypothyroidism, GERD, nonobstructive CAD present with persistent CP that woke him up from sleep.  Problem List  Past Medical History  Diagnosis Date  . Hypertension   . Unspecified intestinal obstruction   . Unspecified asthma(493.90)   . Chest pain, atypical   . Nontoxic multinodular goiter   . Painful respiration   . Dyspnea on exertion   . Trigeminal neuralgia   . Cluster headache   . Hyperlipidemia   . GERD (gastroesophageal reflux disease)   . Complication of anesthesia     difficult waking up"  . Food poisoning 11/2011  . Multiple thyroid nodules   . Hiatal hernia     Past Surgical History  Procedure Laterality Date  . Appendectomy    . Knee arthroscopy    . 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 Right shoulder redo-reconstruction  06/21/10  . Destruction trigeminal nerve via neurolytic agent       Allergies  No Known Allergies  HPI   The patient is a 54 year old AA male was past medical history of HTN, HLD, hypothyroidism, GERD, nonobstructive CAD. Patient has significant family history of early CAD on her mother's side involve multiple maternal aunts, uncles and maternal grandmother all had early heart attack before age 21. His mother had her first heart attack at age 71. The only previous cardiac workup was a cardiac catheterization on 01/21/2009 by Dr. Percival Spanish which showed 25% proximal RCA and 30% mid RCA stenosis, diffuse luminal irregularities in the left circumflex and AV groove, luminal irregularities in LAD, EF 60% with normal wall motion. Primary risk reduction and medical  management was recommended at the time. Patient has not had a cardiology follow-up since.  He denies any recent exertional chest pain, shortness of breath, lower extremity edema, orthopnea or paroxysmal nocturnal dyspnea. He does have history of as reflux and takes PPI regularly. He was expected to to have his [employer] certification test on 10/19/2014. However he woke up around 3 AM with significant left-sided chest pressure radiating to the back. He initially stopped by an urgent care on his way to the hospital, however given his history, he was transferred directly to [facility]. Initial labs shows negative troponin. Chest x-ray was negative for acute process. EKG showed normal sinus rhythm without significant ST-T wave changes. Echo heart diagram was completed and is currently pending. Cardiology has been consulted for chest pain.  Of note, patient states current chest pain is worse with deep inspiration, and has been persistent since 3 AM this morning and for the past 12 hours. Otherwise he did note that current chest pain feels different from his usual as reflux discomfort which is usually associated with belching. He did not have any belching today with his current chest pain.   Inpatient Medications  . aspirin EC  325 mg Oral Daily  . diltiazem  360 mg Oral Daily  . enoxaparin (LOVENOX) injection  40 mg Subcutaneous Q24H  . fluticasone  2 spray Each Nare Daily  . [START ON 10/20/2014] levothyroxine  25 mcg Oral QAC breakfast  . pantoprazole  20 mg Oral Daily  . pravastatin  20 mg Oral q1800  . tamsulosin  0.4 mg Oral QPC supper    Family History Family History  Problem Relation Age of Onset  . Diabetes Mother   . Hyperlipidemia Mother   . Hypertension Mother   . Hypertension Father   . Heart disease Father     CAD/CHF  . Cancer Father     Mesothelioma  . Heart disease Maternal Aunt   . Prostate cancer Neg Hx   . Colon cancer Neg Hx      Social History History    Social History  . Marital Status: Married    Spouse Name: N/A  . Number of Children: N/A  . 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 Smoking status: Current Every Day Smoker -- 0.25 packs/day for 18 years    Types: Cigarettes  . Smokeless tobacco: Never Used     Comment: 07/10/13 1/2 ppd  . Alcohol Use: No     Comment: rare  . Drug Use: No  . Sexual Activity: Not Currently   Other Topics Concern  . Not on file   Social History Narrative   HSG   Culinary school in [place]   Married - '84 - 5 years divorced; remarried '96   1 son - '85      Regular Exercise -  NO           Review of Systems  General:  No chills, fever, night sweats or weight changes.  Cardiovascular:  No dyspnea on exertion, edema, orthopnea, palpitations, paroxysmal nocturnal dyspnea. +chest pain Dermatological: No rash, lesions/masses Respiratory: No cough, dyspnea Urologic: No hematuria, dysuria Abdominal:   No nausea, vomiting, diarrhea, bright red blood per rectum, melena, or hematemesis Neurologic:  No visual changes, wkns, changes in mental status. All other systems reviewed and are otherwise negative except as noted above.  Physical Exam  Blood pressure 131/81, pulse 63, temperature 97.7 F (36.5 C), temperature source Oral, resp. rate 18, height 5' 10.5" (1.791 m), weight 233 lb 4.8 oz (105.824 kg), SpO2 100 %.  General: Pleasant, NAD Psych: Normal affect. Neuro: Alert and oriented X 3. Moves all extremities spontaneously. HEENT: Normal  Neck: Supple without bruits or JVD. Lungs:  Resp regular and unlabored, CTA. Heart: RRR no s3, s4, or murmurs. Abdomen: Soft, non-tender, non-distended, BS + x 4.  Extremities: No clubbing, cyanosis or edema. DP/PT/Radials 2+ and equal bilaterally.  Labs   Recent Labs  10/19/14 0815 10/19/14 1438  TROPONINI <0.03 <0.03   Lab Results  Component Value Date   WBC 10.8*  10/19/2014   HGB 15.1 10/19/2014   HCT 44.2 10/19/2014   MCV 90.4 10/19/2014   PLT 170 10/19/2014     Recent Labs Lab 10/19/14 0815  NA 139  K 3.6  CL 96*  CO2 30  BUN 9  CREATININE 0.90  CALCIUM 9.4  GLUCOSE 147*   Lab Results  Component Value Date   CHOL 259* 02/04/2014   HDL 47.90 02/04/2014   LDLCALC 193* 02/04/2014   TRIG 93.0 02/04/2014   Lab Results  Component Value Date   DDIMER * 06/21/2010    0.51        AT THE INHOUSE ESTABLISHED CUTOFF VALUE OF 0.48 ug/mL FEU, THIS ASSAY HAS BEEN DOCUMENTED IN THE LITERATURE TO HAVE A SENSITIVITY AND NEGATIVE PREDICTIVE VALUE OF AT LEAST 98 TO 99%.  THE TEST RESULT SHOULD BE CORRELATED  WITH AN ASSESSMENT OF THE CLINICAL PROBABILITY OF DVT / VTE.    Radiology/Studies  Dg Chest 2 View  10/19/2014   CLINICAL DATA:  Chest pain, history of tobacco use  EXAM: CHEST - 2 VIEW  COMPARISON:  07/10/2011  FINDINGS: Cardiac shadow is stable. The lungs are well aerated bilaterally. Mild basilar atelectasis is noted bilaterally. No focal confluent infiltrate is seen. Right shoulder replacement is noted.  IMPRESSION: Mild bibasilar atelectasis.   Electronically Signed   By: Inez Catalina M.D.   On: 10/19/2014 09:01    ECG  Normal sinus rhythm without significant ST-T wave changes.  ASSESSMENT AND PLAN  1. Prolonged persistent chest pain radiating to the back  - Initially did have hypertension with blood pressure 172/89 on arrival, now BP 120-130s, radial pulse equal bilateral, did not describe the chest pain as a ripping sharp pain as characteristic with aortic dissection.  - Will obtain serial troponin, if positive will need cardiac catheterization. However if negative and echo shows no RWMA, will consider Myoview, tentatively place on myoview schedule as last trop after 12 hrs still negative. If chest pain does not go away, may consider CTA of the chest to rule out dissection  2. nonobstructive CAD  - Cath 01/22/2015 25%  proximal RCA and a 30% mid RCA stenosis, diffuse luminal irregularities in the left circumflex and AV groove, luminal irregularities in LAD, EF 60% with normal wall motion  - pending echo  3. HTN 4. HLD: on statin 5. Hypothyroidism 6. GERD   Signed, Almyra Deforest, Hershal Coria 10/19/2014, 3:59 PM   The patient was seen, examined and discussed with Almyra Deforest, PA-C and I agree with the above.   54 year old male with h/o obesity, HTN, HLP, FH of premature CAD with known minimal non-obstructive CAD on cath in 2010, he works as a Biomedical scientist and states that he never feels CP or SOB. He woke up this morning with sharp left sided chest pain, that radiates to his back and is worse while coughing or on inspiration. The pain is constant and not relieved by NTG.   ECG is normal, troponin negative x 2. He is on aspirin and pravastatin that he was compliant with. We will order a cardiac MRI for evaluation of atypical chest - with suspicion for pericarditis. The patient has had chills 4-5 days ago. If MRI negative we will add stress MRI or nuclear MRI in the am (he is not NPO right now).   Dorothy Spark, MD, [facility] 10/19/2014  Addendum: The patient is claustrophobic and couldn't tolerate MRI. We will schedule for a nuclear stress test for tomorrow.  Dorothy Spark

## 2014-10-19 NOTE — H&P (Signed)
[facility] History and Physical                                                                                    [name], is a 54 y.o. male  MRN: [id number]   DOB - 1960/07/13  Admit Date - 10/19/2014  Outpatient Primary MD for the patient is Leeanne Rio, PA-C  Referring MD: Winfred Leeds / ER  With History of -  Past Medical History  Diagnosis Date  . Hypertension   . Unspecified intestinal obstruction   . Unspecified asthma(493.90)   . Chest pain, atypical   . Nontoxic multinodular goiter   . Painful respiration   . Dyspnea on exertion   . Trigeminal neuralgia   . Cluster headache   . Hyperlipidemia   . GERD (gastroesophageal reflux disease)   . Complication of anesthesia     difficult waking up"  . Food poisoning 11/2011  . Multiple thyroid nodules   . Hiatal hernia       Past Surgical History  Procedure Laterality Date  . Appendectomy    . Knee arthroscopy    . 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 Right shoulder redo-reconstruction  06/21/10  . Destruction trigeminal nerve via neurolytic agent      in for   Chief Complaint  Patient presents with  . Chest Pain     HPI This is a 54 year old male patient with a history of hypertension, hypothyroidism with multiple thyroid nodules, dyslipidemia, GERD and setting of known hiatal hernia. Prior atypical chest pain without history of stress test and ongoing tobacco abuse. Patient presented to the [facility] ER with complaints of chest pain that awakened him around 3:30 this morning. Initially when he awakened he thought the left-sided anterior chest pain was related to his reflux and he sat up for a little while, to some of his reflux medications but had no improvement in his symptoms. At this juncture he opted to take a shower. While bending over in the shower he felt significant pressure in the chest that radiated up through the left back and down to  the left arm. During the same time he felt very thirsty and drank quite a bit of water because his mouth was dry. He also felt very weak with his left arm and decided that since his symptoms were continuing and were associated with dizziness and lightheadedness that may be he should come to the hospital. He attempted to get into the car to drive that his left arm was so weak he went back into the house and called his brother brought him to the hospital.   The ER patient was afebrile, blood pressures 05/17/1966, heart rate 62 and room air saturations were 92%. Issue EKG was unremarkable and without ischemic changes. Initial troponin is negative at less than 0.03. Laboratory data were unremarkable for mildly elevated white count of 10,800. Even 3 sublingual nitroglycerin which decreased his pain from 7/10-3/10. He was given a dose of IV morphine which resolved his pain. Patient was accepted in transfer to [facility] come hospital for further cardiology evaluation in  the chest pain. Patient reported that while being transported in the ambulance he had increasing and chest pain as the ambulance hit bumps. He reports that since arrival he has had some pain with deep breathing. He is also noted pain after eating some food.    Review of Systems   In addition to the HPI above,  No Fever-chills, myalgias or other constitutional symptoms No Headache, changes with Vision or hearing, new weakness, tingling, numbness in any extremity, No problems swallowing food or Liquids, indigestion/reflux No Cough or Shortness of Breath, palpitations, orthopnea or DOE No Abdominal pain, N/V; no melena or hematochezia, no dark tarry stools, Bowel movements are regular, No dysuria, hematuria or flank pain No new skin rashes, lesions, masses or bruises, No new joints pains-aches No recent weight gain or loss No polyuria, polydypsia or polyphagia,  *A full 10 point Review of Systems was done, except as stated above, all other Review  of Systems were negative.  Social History History  Substance Use Topics  . Smoking status: Current Every Day Smoker -- 0.25 packs/day for 18 years    Types: Cigarettes  . Smokeless tobacco: Never Used     Comment: 07/10/13 1/2 ppd  . Alcohol Use: No     Comment: rare    Resides at: Private residence  Lives with: Wife  Ambulatory status: Without assistive devices   Family History Family History  Problem Relation Age of Onset  . Diabetes Mother   . Hyperlipidemia Mother   . Hypertension Mother   . Hypertension Father   . Heart disease Father     CAD/CHF  . Cancer Father     Mesothelioma  . Heart disease Maternal Aunt   . Prostate cancer Neg Hx   . Colon cancer Neg Hx    **Patient reports on mother's side of the family there is a history of significant coronary disease and he has had a sister who had an MI in her 51s  Prior to Admission medications   Medication Sig Start Date End Date Taking? Authorizing Provider  aspirin 81 MG EC tablet Take 81 mg by mouth daily.      Historical Provider, MD  diltiazem (TIAZAC) 360 MG 24 hr capsule TAKE 1 CAPSULE (360 MG TOTAL) BY MOUTH DAILY. 08/11/14   Brunetta Jeans, PA-C  fluticasone (FLONASE) 50 MCG/ACT nasal spray Place 2 sprays into both nostrils daily. 12/24/13   Brunetta Jeans, PA-C  lansoprazole (PREVACID) 15 MG capsule TAKE 1 CAPSULE (15 MG TOTAL) BY MOUTH DAILY AT 12 NOON. 08/11/14   Brunetta Jeans, PA-C  lansoprazole (PREVACID) 30 MG capsule TAKE 1 CAPSULE (30 MG TOTAL) BY MOUTH DAILY. 01/21/14   Olga Millers, MD  levothyroxine (SYNTHROID, LEVOTHROID) 25 MCG tablet TAKE 1 TABLET (25 MCG TOTAL) BY MOUTH DAILY. 08/28/14   Brunetta Jeans, PA-C  pravastatin (PRAVACHOL) 20 MG tablet TAKE 1 TABLET (20 MG TOTAL) BY MOUTH DAILY. NEED APPOINTMENT FOR MORE REFILLS 08/28/14   Brunetta Jeans, PA-C  tamsulosin (FLOMAX) 0.4 MG CAPS capsule TAKE 1 CAPSULE (0.4 MG TOTAL) BY MOUTH DAILY AFTER SUPPER. 08/28/14   Brunetta Jeans, PA-C     No Known Allergies  Physical Exam  Vitals  Blood pressure 131/81, pulse 63, temperature 97.7 F (36.5 C), temperature source Oral, resp. rate 18, height 5' 10.5" (1.791 m), weight 233 lb 4.8 oz (105.824 kg), SpO2 100 %.   General:  In no acute distress, appears healthy and well nourished  Psych:  Normal affect, Denies Suicidal or Homicidal ideations, Awake Alert, Oriented X 3. Speech and thought patterns are clear and appropriate, no apparent short term memory deficits  Neuro:   No focal neurological deficits, CN II through XII intact, Strength 5/5 all 4 extremities, Sensation intact all 4 extremities.  ENT:  Ears and Eyes appear Normal, Conjunctivae clear, PER. Moist oral mucosa without erythema or exudates.  Neck:  Supple, No lymphadenopathy appreciated  Respiratory:  Symmetrical chest wall movement, Good air movement bilaterally, CTAB. Room Air; chest wall nontender with palpation  Cardiac:  RRR, No Murmurs, no LE edema noted, no JVD, No carotid bruits, peripheral pulses palpable at 2+  Abdomen:  Positive bowel sounds, Soft, Non tender, Non distended,  No masses appreciated, no obvious hepatosplenomegaly  Skin:  No Cyanosis, Normal Skin Turgor, No Skin Rash or Bruise.  Extremities: Symmetrical without obvious trauma or injury,  no effusions.  Data Review  CBC  Recent Labs Lab 10/19/14 0818  WBC 10.8*  HGB 15.1  HCT 44.2  PLT 170  MCV 90.4  MCH 30.9  MCHC 34.2  RDW 12.9  LYMPHSABS 3.5  MONOABS 1.1*  EOSABS 0.1  BASOSABS 0.0    Chemistries   Recent Labs Lab 10/19/14 0815  NA 139  K 3.6  CL 96*  CO2 30  GLUCOSE 147*  BUN 9  CREATININE 0.90  CALCIUM 9.4    estimated creatinine clearance is 119.2 mL/min (by C-G formula based on Cr of 0.9).  No results for input(s): TSH, T4TOTAL, T3FREE, THYROIDAB in the last 72 hours.  Invalid input(s): FREET3  Coagulation profile No results for input(s): INR, PROTIME in the last 168 hours.  No results  for input(s): DDIMER in the last 72 hours.  Cardiac Enzymes  Recent Labs Lab 10/19/14 0815  TROPONINI <0.03    Invalid input(s): POCBNP  Urinalysis    Component Value Date/Time   COLORURINE YELLOW 02/04/2014 0830   APPEARANCEUR CLEAR 02/04/2014 0830   LABSPEC 1.015 02/04/2014 0830   PHURINE 7.0 02/04/2014 0830   GLUCOSEU NEGATIVE 02/04/2014 0830   GLUCOSEU NEGATIVE 05/12/2013 1657   HGBUR NEGATIVE 02/04/2014 0830   BILIRUBINUR NEGATIVE 02/04/2014 0830   KETONESUR NEGATIVE 02/04/2014 0830   PROTEINUR NEGATIVE 05/12/2013 1657   UROBILINOGEN 0.2 02/04/2014 0830   NITRITE NEGATIVE 02/04/2014 0830   LEUKOCYTESUR NEGATIVE 02/04/2014 0830    Imaging results:   Dg Chest 2 View  10/19/2014   CLINICAL DATA:  Chest pain, history of tobacco use  EXAM: CHEST - 2 VIEW  COMPARISON:  07/10/2011  FINDINGS: Cardiac shadow is stable. The lungs are well aerated bilaterally. Mild basilar atelectasis is noted bilaterally. No focal confluent infiltrate is seen. Right shoulder replacement is noted.  IMPRESSION: Mild bibasilar atelectasis.   Electronically Signed   By: Inez Catalina M.D.   On: 10/19/2014 09:01     EKG: (Independently reviewed) sinus rhythm with normal QTC and no ST or T-wave changes that would be concerning for ischemia   Assessment & Plan  Principal Problem:   Chest pain -Admit to telemetry -Chest pain has both typical and atypical features and was decreased with nitroglycerin and resolved after morphine -Check echocardiogram -Cycle troponin -Nuclear med stress test ordered and cardiology consulted -Increase aspirin to 325 mg daily -Chest pain remains low-grade at this juncture -Patient on diltiazem prior to admission-consider discontinued in favor of beta blocker if high index of suspicion for coronary disease noting current heart score 4  Active Problems:   Essential hypertension -Continue  diltiazem   Tobacco abuse -Counseled regarding cessation -Significant risk  factor for CAD    Dyslipidemia -Continue preadmission statin -Check lipid panel-Last lipid panel was checked over 2015 cholesterol was 259, triglycerides were 93, HDL was 48 and LDL is elevated at 193    GERD -Continue PPI -Some of patient's chest pain could be explained by GERD     Hypothyroidism -Continue Synthroid -Last TSH was 0.76 in 01/27/2014    Asthma -Currently asymptomatic    Anxiety -Not on chronic medications prior to admission    BPH associated with nocturia -Continue Flomax    DVT Prophylaxis: Lovenox  Family Communication:   No family at bedside  Code Status: Full code   Condition:  Stable  Discharge disposition: Anticipate discharge back to home pending cardiac evaluation  Time spent in minutes : 60      Julieanna Geraci L. ANP on 10/19/2014 at 2:39 PM  Between 7am to 7pm - Pager - [phone]  After 7pm go to [email or web address] - password [email or web address]  And look for the night coverage person covering me after hours  [facility]

## 2014-10-19 NOTE — ED Provider Notes (Signed)
CSN: [id number]     Arrival date & time 10/19/14  0804 History   First MD Initiated Contact with Patient 10/19/14 0815     Chief Complaint  Patient presents with  . Chest Pain     (Consider location/radiation/quality/duration/timing/severity/associated sxs/prior Treatment) HPI Complains of left anterior chest pain onset 3:30 AM today awakened him from sleep nonradiating. Mildly worse with exertion. Discomfort is constant since 3:30 AM Associated symptoms include dyspnea with exertion no nausea no sweatiness no treatment prior to coming here. No other associated symptoms Past Medical History  Diagnosis Date  . Hypertension   . Unspecified intestinal obstruction   . Unspecified asthma(493.90)   . Chest pain, atypical   . Nontoxic multinodular goiter   . Painful respiration   . Dyspnea on exertion   . Trigeminal neuralgia   . Cluster headache   . Hyperlipidemia   . GERD (gastroesophageal reflux disease)   . Complication of anesthesia     difficult waking up"  . Food poisoning 11/2011  . Multiple thyroid nodules   . Hiatal hernia    Past Surgical History  Procedure Laterality Date  . Appendectomy    . Knee arthroscopy    . 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 Right shoulder redo-reconstruction  06/21/10  . Destruction trigeminal nerve via neurolytic agent     Family History  Problem Relation Age of Onset  . Diabetes Mother   . Hyperlipidemia Mother   . Hypertension Mother   . Hypertension Father   . Heart disease Father     CAD/CHF  . Cancer Father     Mesothelioma  . Heart disease Maternal Aunt   . Prostate cancer Neg Hx   . Colon cancer Neg Hx    History  Substance Use Topics  . Smoking status: Current Every Day Smoker -- 0.25 packs/day for 18 years    Types: Cigarettes  . Smokeless tobacco: Never Used     Comment: 07/10/13 1/2 ppd  . Alcohol Use: No     Comment: rare    Review of Systems  Constitutional: Negative.   HENT:  Negative.   Respiratory: Positive for shortness of breath.   Cardiovascular: Positive for chest pain.  Gastrointestinal: Negative.   Musculoskeletal: Negative.   Skin: Negative.   Neurological: Negative.   Psychiatric/Behavioral: Negative.   All other systems reviewed and are negative.     Allergies  Review of patient's allergies indicates no known allergies.  Home Medications   Prior to Admission medications   Medication Sig Start Date End Date Taking? Authorizing Provider  aspirin 81 MG EC tablet Take 81 mg by mouth daily.      Historical Provider, MD  diltiazem (TIAZAC) 360 MG 24 hr capsule TAKE 1 CAPSULE (360 MG TOTAL) BY MOUTH DAILY. 08/11/14   Brunetta Jeans, PA-C  fluticasone (FLONASE) 50 MCG/ACT nasal spray Place 2 sprays into both nostrils daily. 12/24/13   Brunetta Jeans, PA-C  lansoprazole (PREVACID) 15 MG capsule TAKE 1 CAPSULE (15 MG TOTAL) BY MOUTH DAILY AT 12 NOON. 08/11/14   Brunetta Jeans, PA-C  lansoprazole (PREVACID) 30 MG capsule TAKE 1 CAPSULE (30 MG TOTAL) BY MOUTH DAILY. 01/21/14   Olga Millers, MD  levothyroxine (SYNTHROID, LEVOTHROID) 25 MCG tablet TAKE 1 TABLET (25 MCG TOTAL) BY MOUTH DAILY. 08/28/14   Brunetta Jeans, PA-C  pravastatin (PRAVACHOL) 20 MG tablet TAKE 1 TABLET (20 MG TOTAL) BY MOUTH DAILY. NEED  APPOINTMENT FOR MORE REFILLS 08/28/14   Brunetta Jeans, PA-C  tamsulosin (FLOMAX) 0.4 MG CAPS capsule TAKE 1 CAPSULE (0.4 MG TOTAL) BY MOUTH DAILY AFTER SUPPER. 08/28/14   Brunetta Jeans, PA-C   BP 172/89 mmHg  Pulse 82  Temp(Src) 98.4 F (36.9 C) (Oral)  Resp 18  Ht 5' 10.5" (1.791 m)  Wt 260 lb (117.935 kg)  BMI 36.77 kg/m2  SpO2 97% Physical Exam  Constitutional: He appears well-developed and well-nourished.  HENT:  Head: Normocephalic and atraumatic.  Eyes: Conjunctivae are normal. Pupils are equal, round, and reactive to light.  Neck: Neck supple. No tracheal deviation present. No thyromegaly present.  Cardiovascular: Normal rate  and regular rhythm.   No murmur heard. Pulmonary/Chest: Effort normal and breath sounds normal.  Abdominal: Soft. Bowel sounds are normal. He exhibits no distension. There is no tenderness.  Obese  Musculoskeletal: Normal range of motion. He exhibits no edema or tenderness.  Neurological: He is alert. Coordination normal.  Skin: Skin is warm and dry. No rash noted.  Psychiatric: He has a normal mood and affect.  Nursing note and vitals reviewed.   ED Course  Procedures (including critical care time) Labs Review Labs Reviewed  CBC WITH DIFFERENTIAL/PLATELET - Abnormal; Notable for the following:    WBC 10.8 (*)    Monocytes Absolute 1.1 (*)    All other components within normal limits  BASIC METABOLIC PANEL  TROPONIN I    Imaging Review No results found.   EKG Interpretation   Date/Time:  Monday October 19 2014 08:10:23 EDT Ventricular Rate:  84 PR Interval:  168 QRS Duration: 96 QT Interval:  368 QTC Calculation: 434 R Axis:   82 Text Interpretation:  Normal sinus rhythm Normal ECG No significant change  since last tracing Confirmed by Rosalie Buenaventura  MD, Jacquelynn Friend ([id number]) on 10/19/2014  8:16:17 AM     10:40 AM patient pain-free after treatment with sublingual nitroglycerin, aspirin, intravenous morphine and nitroglycerin ointment.  Chest xray viewed by me Results for orders placed or performed during the hospital encounter of 10/19/14  CBC with Differential  Result Value Ref Range   WBC 10.8 (H) 4.0 - 10.5 K/uL   RBC 4.89 4.22 - 5.81 MIL/uL   Hemoglobin 15.1 13.0 - 17.0 g/dL   HCT 44.2 39.0 - 52.0 %   MCV 90.4 78.0 - 100.0 fL   MCH 30.9 26.0 - 34.0 pg   MCHC 34.2 30.0 - 36.0 g/dL   RDW 12.9 11.5 - 15.5 %   Platelets 170 150 - 400 K/uL   Neutrophils Relative % 56 43 - 77 %   Neutro Abs 6.1 1.7 - 7.7 K/uL   Lymphocytes Relative 32 12 - 46 %   Lymphs Abs 3.5 0.7 - 4.0 K/uL   Monocytes Relative 11 3 - 12 %   Monocytes Absolute 1.1 (H) 0.1 - 1.0 K/uL   Eosinophils Relative  1 0 - 5 %   Eosinophils Absolute 0.1 0.0 - 0.7 K/uL   Basophils Relative 0 0 - 1 %   Basophils Absolute 0.0 0.0 - 0.1 K/uL  Basic metabolic panel  Result Value Ref Range   Sodium 139 135 - 145 mmol/L   Potassium 3.6 3.5 - 5.1 mmol/L   Chloride 96 (L) 101 - 111 mmol/L   CO2 30 22 - 32 mmol/L   Glucose, Bld 147 (H) 65 - 99 mg/dL   BUN 9 6 - 20 mg/dL   Creatinine, Ser 0.90 0.61 - 1.24 mg/dL  Calcium 9.4 8.9 - 10.3 mg/dL   GFR calc non Af Amer >60 >60 mL/min   GFR calc Af Amer >60 >60 mL/min   Anion gap 13 5 - 15  Troponin I  Result Value Ref Range   Troponin I <0.03 <0.031 ng/mL   Dg Chest 2 View  10/19/2014   CLINICAL DATA:  Chest pain, history of tobacco use  EXAM: CHEST - 2 VIEW  COMPARISON:  07/10/2011  FINDINGS: Cardiac shadow is stable. The lungs are well aerated bilaterally. Mild basilar atelectasis is noted bilaterally. No focal confluent infiltrate is seen. Right shoulder replacement is noted.  IMPRESSION: Mild bibasilar atelectasis.   Electronically Signed   By: Inez Catalina M.D.   On: 10/19/2014 09:01    MDM  Heart score equals 4, based on risk factors including hypercholesterolemia hypertension family history smoking, and obesity, age, history Final diagnoses:  None  Spoke with Dr. Allyson Sabal  plan transfer [facility] , telemetry, .symptom management.suggest definitive diagnostic testing to assess for CAD  Dx chest pain    Orlie Dakin, MD 10/19/14 1054

## 2014-10-19 NOTE — ED Notes (Signed)
Patient transported to X-ray 

## 2014-10-19 NOTE — ED Notes (Signed)
Patient asked to change into a gown.  

## 2014-10-20 ENCOUNTER — Observation Stay (HOSPITAL_COMMUNITY): Payer: BLUE CROSS/BLUE SHIELD

## 2014-10-20 ENCOUNTER — Encounter (HOSPITAL_COMMUNITY): Payer: Self-pay | Admitting: *Deleted

## 2014-10-20 DIAGNOSIS — J40 Bronchitis, not specified as acute or chronic: Secondary | ICD-10-CM

## 2014-10-20 DIAGNOSIS — K219 Gastro-esophageal reflux disease without esophagitis: Secondary | ICD-10-CM

## 2014-10-20 DIAGNOSIS — R0789 Other chest pain: Secondary | ICD-10-CM | POA: Diagnosis not present

## 2014-10-20 DIAGNOSIS — F419 Anxiety disorder, unspecified: Secondary | ICD-10-CM

## 2014-10-20 DIAGNOSIS — E039 Hypothyroidism, unspecified: Secondary | ICD-10-CM

## 2014-10-20 DIAGNOSIS — R079 Chest pain, unspecified: Secondary | ICD-10-CM | POA: Diagnosis not present

## 2014-10-20 DIAGNOSIS — Z720 Tobacco use: Secondary | ICD-10-CM | POA: Diagnosis not present

## 2014-10-20 DIAGNOSIS — R7989 Other specified abnormal findings of blood chemistry: Secondary | ICD-10-CM | POA: Insufficient documentation

## 2014-10-20 DIAGNOSIS — R791 Abnormal coagulation profile: Secondary | ICD-10-CM

## 2014-10-20 DIAGNOSIS — J45909 Unspecified asthma, uncomplicated: Secondary | ICD-10-CM | POA: Diagnosis not present

## 2014-10-20 DIAGNOSIS — I2510 Atherosclerotic heart disease of native coronary artery without angina pectoris: Secondary | ICD-10-CM | POA: Insufficient documentation

## 2014-10-20 DIAGNOSIS — I10 Essential (primary) hypertension: Secondary | ICD-10-CM | POA: Diagnosis not present

## 2014-10-20 LAB — TROPONIN I: Troponin I: <0.03 ng/mL (ref <0.031)

## 2014-10-20 LAB — NM MYOCAR MULTI W/SPECT W/WALL MOTION / EF
Estimated workload: 8.5 METS
Exercise duration (min): 7 min
Exercise duration (sec): 0 s
LV dias vol: 129 mL
LV sys vol: 70 mL
MPHR: 169 {beats}/min
Peak HR: 148 {beats}/min
Percent HR: 87 %
RATE: 0.32
Rest HR: 68 {beats}/min
SDS: 0
SRS: 0
SSS: 0
TID: 1.04

## 2014-10-20 LAB — LIPID PANEL
Cholesterol: 203 mg/dL — ABNORMAL HIGH (ref 0–200)
HDL: 52 mg/dL (ref >40)
LDL Cholesterol: 137 mg/dL — ABNORMAL HIGH (ref 0–99)
Total CHOL/HDL Ratio: 3.9 RATIO
Triglycerides: 71 mg/dL (ref <150)
VLDL: 14 mg/dL (ref 0–40)

## 2014-10-20 MED ORDER — MAGIC MOUTHWASH
5.0000 mL | Freq: Four times a day (QID) | ORAL | Status: DC | PRN
  Administered 2014-10-20 – 2014-10-21 (×2): 5 mL via ORAL
  Filled 2014-10-20 (×4): qty 5

## 2014-10-20 MED ORDER — GI COCKTAIL ~~LOC~~
30.0000 mL | Freq: Three times a day (TID) | ORAL | Status: DC
Start: 2014-10-20 — End: 2014-10-21
  Administered 2014-10-20 – 2014-10-21 (×2): 30 mL via ORAL
  Filled 2014-10-20 (×5): qty 30

## 2014-10-20 MED ORDER — TECHNETIUM TC 99M SESTAMIBI GENERIC - CARDIOLITE
10.0000 | Freq: Once | INTRAVENOUS | Status: AC | PRN
  Administered 2014-10-20: 10 via INTRAVENOUS

## 2014-10-20 MED ORDER — DILTIAZEM HCL ER COATED BEADS 360 MG PO CP24
360.0000 mg | ORAL_CAPSULE | Freq: Every day | ORAL | Status: DC
  Administered 2014-10-20: 360 mg via ORAL
  Filled 2014-10-20: qty 1

## 2014-10-20 MED ORDER — TECHNETIUM TC 99M SESTAMIBI GENERIC - CARDIOLITE
30.0000 | Freq: Once | INTRAVENOUS | Status: AC | PRN
  Administered 2014-10-20: 30 via INTRAVENOUS

## 2014-10-20 MED ORDER — ROSUVASTATIN CALCIUM 40 MG PO TABS
40.0000 mg | ORAL_TABLET | Freq: Every day | ORAL | Status: DC
  Administered 2014-10-20: 40 mg via ORAL
  Filled 2014-10-20 (×3): qty 1

## 2014-10-20 MED ORDER — DILTIAZEM HCL ER COATED BEADS 360 MG PO CP24
360.0000 mg | ORAL_CAPSULE | Freq: Every day | ORAL | Status: DC
  Administered 2014-10-21: 360 mg via ORAL
  Filled 2014-10-20 (×2): qty 1

## 2014-10-20 NOTE — Progress Notes (Signed)
[facility] PROGRESS NOTE   [name] ZYS:[id number] DOB: 11/14/1960 DOA: 10/19/2014 PCP: Leeanne Rio, PA-C  HPI/Subjective: Presented with chest pain, 6 complaining about 3/10 inframammary chest pain.  Assessment/Plan: Principal Problem:   Chest pain Active Problems:   Dyslipidemia   Essential hypertension   Asthma   GERD   Anxiety   Tobacco abuse   BPH associated with nocturia   Hypothyroidism    Chest pain -Presented with inframammary chest pain. Atypical chest pain. -Dull chest pain, inframammary, increases with taking deep breath. -Has positive d-dimer is, CT angiography was done and showed no acute events. -Had 3 negative sets of cardiac enzymes, EKG did not show evidence of ischemia. -Earlier today had negative stress test, patient reported he did have pain and SOB while he was getting his test. -Cardiology recommended no further workup. -Give scheduled GI cocktail, evaluate the pain in a.m.   Essential hypertension -Continue diltiazem  Tobacco abuse -Counseled regarding cessation -Significant risk factor for CAD   Dyslipidemia -On pravastatin, his LDL is 137, this is switched to Crestor.   GERD -Continue PPI -Some of patient's chest pain could be explained by GERD    Hypothyroidism -Continue Synthroid -Last TSH was 0.76 in 01/27/2014   BPH associated with nocturia -Continue Flomax  Code Status: Full Code Family Communication: Plan discussed with the patient. Disposition Plan: Remains inpatient Diet: Diet Heart Room service appropriate?: Yes; Fluid consistency:: Thin  Consultants:  Cardiology  Procedures:  None  Antibiotics:  None   Objective: Filed Vitals:   10/20/14 1035  BP: 126/68  Pulse: 68  Temp:   Resp: 18    Intake/Output Summary (Last 24 hours) at 10/20/14 1705 Last data filed at 10/20/14 1344  Gross per 24 hour  Intake   1090 ml  Output      0 ml  Net   1090 ml   Filed Weights   10/19/14 0812 10/19/14 1248 10/20/14 0701  Weight: 117.935 kg (260 lb) 105.824 kg (233 lb 4.8 oz) 105.597 kg (232 lb 12.8 oz)    Exam: General: Alert and awake, oriented x3, not in any acute distress. HEENT: anicteric sclera, pupils reactive to light and accommodation, EOMI CVS: S1-S2 clear, no murmur rubs or gallops Chest: clear to auscultation bilaterally, no wheezing, rales or rhonchi Abdomen: soft nontender, nondistended, normal bowel sounds, no organomegaly Extremities: no cyanosis, clubbing or edema noted bilaterally Neuro: Cranial nerves II-XII intact, no focal neurological deficits  Data Reviewed: Basic Metabolic Panel:  Recent Labs Lab 10/19/14 0815  NA 139  K 3.6  CL 96*  CO2 30  GLUCOSE 147*  BUN 9  CREATININE 0.90  CALCIUM 9.4   Liver Function Tests: No results for input(s): AST, ALT, ALKPHOS, BILITOT, PROT, ALBUMIN in the last 168 hours. No results for input(s): LIPASE, AMYLASE in the last 168 hours. No results for input(s): AMMONIA in the last 168 hours. CBC:  Recent Labs Lab 10/19/14 0818  WBC 10.8*  NEUTROABS 6.1  HGB 15.1  HCT 44.2  MCV 90.4  PLT 170   Cardiac Enzymes:  Recent Labs Lab 10/19/14 0815 10/19/14 1438 10/19/14 1924 10/20/14 0300  TROPONINI <0.03 <0.03 <0.03 <0.03   BNP (last 3 results) No results for input(s): BNP in the last 8760 hours.  ProBNP (last 3 results) No results for input(s): PROBNP in the last 8760 hours.  CBG: No results for input(s): GLUCAP in the last 168 hours.  Micro No results found for this or any previous visit (from the  past 240 hour(s)).   Studies: Dg Chest 2 View  10/19/2014   CLINICAL DATA:  Chest pain, history of tobacco use  EXAM: CHEST - 2 VIEW  COMPARISON:  07/10/2011  FINDINGS: Cardiac shadow is stable. The lungs are well aerated bilaterally. Mild basilar atelectasis is noted bilaterally. No focal confluent infiltrate is seen. Right shoulder replacement is noted.  IMPRESSION: Mild bibasilar  atelectasis.   Electronically Signed   By: Inez Catalina M.D.   On: 10/19/2014 09:01   Ct Angio Chest Pe W/cm &/or Wo Cm  10/19/2014   CLINICAL DATA:  Chest pain, elevated D-dimer level.  EXAM: CT ANGIOGRAPHY CHEST WITH CONTRAST  TECHNIQUE: Multidetector CT imaging of the chest was performed using the standard protocol during bolus administration of intravenous contrast. Multiplanar CT image reconstructions and MIPs were obtained to evaluate the vascular anatomy.  CONTRAST:  175mL OMNIPAQUE IOHEXOL 350 MG/ML SOLN  COMPARISON:  CT scan of June 21, 2010.  FINDINGS: No pneumothorax or pleural effusion is noted. Minimal bilateral posterior basilar subsegmental atelectasis is noted. 5 mm nodule is noted in right upper lobe which is not significantly changed compared to prior exam. No further follow-up is required. There is no evidence of pulmonary embolus. There is no evidence of thoracic aortic dissection or aneurysm. No mediastinal mass or adenopathy is noted.  Visualized portion of the upper abdomen demonstrates 16 mm exophytic abnormality arising from upper pole of left kidney.  Review of the MIP images confirms the above findings.  IMPRESSION: Stable right upper lobe nodule compared to prior exam. This can be considered benign without further follow-up required.  There is no evidence of pulmonary embolus.  16 mm exophytic abnormality arising from upper pole of left kidney which may represent hyperdense cyst or mass. Renal ultrasound is recommended for further evaluation.   Electronically Signed   By: Marijo Conception, M.D.   On: 10/19/2014 21:04   Nm Myocar Multi W/spect W/wall Motion / Ef  10/20/2014    Blood pressure demonstrated a hypertensive response to exercise.  There was no ST segment deviation noted during stress.  No T wave inversion was noted during stress.  The left ventricular ejection fraction is moderately decreased (30-44%).  Nuclear stress EF: 44%.  The study is normal.  This is an  intermediate risk study.     Scheduled Meds: . aspirin EC  325 mg Oral Daily  . diltiazem  360 mg Oral Daily  . enoxaparin (LOVENOX) injection  40 mg Subcutaneous Q24H  . fluticasone  2 spray Each Nare Daily  . levothyroxine  25 mcg Oral QAC breakfast  . pantoprazole  20 mg Oral Daily  . rosuvastatin  40 mg Oral q1800  . tamsulosin  0.4 mg Oral QPC supper   Continuous Infusions:      Time spent: 35 minutes    [facility]  [facility] Pager [phone] If 7PM-7AM, please contact night-coverage at [email or web address], password [facility] 10/20/2014, 5:05 PM

## 2014-10-20 NOTE — Progress Notes (Signed)
Patient Profile: 54 year old African-American male with past medical history of HTN, HLD, hypothyroidism, GERD, nonobstructive CAD on prior cath, admitted for chest pain.   Subjective: Still with mild occasional chest tightness.   Objective: Vital signs in last 24 hours: Temp:  [97.7 F (36.5 C)-97.9 F (36.6 C)] 97.9 F (36.6 C) (06/28 0701) Pulse Rate:  [59-139] 139 (06/28 0923) Resp:  [10-20] 18 (06/28 0701) BP: (115-189)/(68-88) 160/76 mmHg (06/28 0923) SpO2:  [87 %-100 %] 92 % (06/28 0701) Weight:  [232 lb 12.8 oz (105.597 kg)-233 lb 4.8 oz (105.824 kg)] 232 lb 12.8 oz (105.597 kg) (06/28 0701) Last BM Date: 10/19/14  Intake/Output from previous day: 06/27 0701 - 06/28 0700 In: 630 [P.O.:630] Out: -  Intake/Output this shift:    Medications Current Facility-Administered Medications  Medication Dose Route Frequency Provider Last Rate Last Dose  . acetaminophen (TYLENOL) tablet 650 mg  650 mg Oral Q4H PRN Samella Parr, NP      . ALPRAZolam Duanne Moron) tablet 0.25 mg  0.25 mg Oral BID PRN Samella Parr, NP      . aspirin EC tablet 325 mg  325 mg Oral Daily Samella Parr, NP   325 mg at 10/19/14 1430  . diltiazem (TIAZAC) 24 hr capsule 360 mg  360 mg Oral Daily Samella Parr, NP   360 mg at 10/19/14 1402  . enoxaparin (LOVENOX) injection 40 mg  40 mg Subcutaneous Q24H Samella Parr, NP   40 mg at 10/19/14 1500  . fluticasone (FLONASE) 50 MCG/ACT nasal spray 2 spray  2 spray Each Nare Daily Samella Parr, NP      . gi cocktail (Maalox,Lidocaine,Donnatal)  30 mL Oral QID PRN Samella Parr, NP   30 mL at 10/20/14 0600  . levothyroxine (SYNTHROID, LEVOTHROID) tablet 25 mcg  25 mcg Oral QAC breakfast Samella Parr, NP   25 mcg at 10/20/14 [phone]  . morphine 2 MG/ML injection 2 mg  2 mg Intravenous Q2H PRN Samella Parr, NP   2 mg at 10/20/14 0600  . ondansetron (ZOFRAN) injection 4 mg  4 mg Intravenous Q6H PRN Samella Parr, NP      . pantoprazole (PROTONIX) EC  tablet 20 mg  20 mg Oral Daily Samella Parr, NP   20 mg at 10/19/14 1401  . pravastatin (PRAVACHOL) tablet 20 mg  20 mg Oral q1800 Samella Parr, NP   20 mg at 10/19/14 1755  . tamsulosin (FLOMAX) capsule 0.4 mg  0.4 mg Oral QPC supper Samella Parr, NP   0.4 mg at 10/19/14 1755  . zolpidem (AMBIEN) tablet 5 mg  5 mg Oral QHS PRN,MR X 1 Samella Parr, NP        PE: General appearance: alert, cooperative and no distress Neck: no carotid bruit and no JVD Lungs: clear to auscultation bilaterally Heart: regular rate and rhythm, S1, S2 normal, no murmur, click, rub or gallop Extremities: no LEE Pulses: 2+ and symmetric Skin: warm and dry Neurologic: Grossly normal  Lab Results:   Recent Labs  10/19/14 0818  WBC 10.8*  HGB 15.1  HCT 44.2  PLT 170   BMET  Recent Labs  10/19/14 0815  NA 139  K 3.6  CL 96*  CO2 30  GLUCOSE 147*  BUN 9  CREATININE 0.90  CALCIUM 9.4   PT/INR No results for input(s): LABPROT, INR in the last 72 hours. Cholesterol  Recent Labs  10/20/14 0300  CHOL 203*   Cardiac Panel (last 3 results)  Recent Labs  10/19/14 1438 10/19/14 1924 10/20/14 0300  TROPONINI <0.03 <0.03 <0.03    Studies/Results:  2D echo 10/19/14 Study Conclusions  - Left ventricle: The cavity size was normal. Wall thickness was increased in a pattern of mild LVH. Systolic function was normal. The estimated ejection fraction was in the range of 60% to 65%. Wall motion was normal; there were no regional wall motion abnormalities. The study is not technically sufficient to allow evaluation of LV diastolic function. - Right ventricle: The cavity size was mildly dilated. - Right atrium: The atrium was mildly to moderately dilated.  NST - pending   Assessment/Plan    Principal Problem:   Chest pain Active Problems:   Dyslipidemia   Essential hypertension   Asthma   GERD   Anxiety   Tobacco abuse   BPH associated with nocturia    Hypothyroidism   1. Chest Pain: elevated D-dimer but CT of chest negative for PE. Cardiac enzymes negative x 3. 2D echo with normal LVF and wall motion. No significant valvular disease. Pericardium normal. Exercise NST completed. No significant EKG changes during stress portion. Radiologist interpretation pending. GR to read. If no ischemia, no further w/u. If abnormal findings, will plan for LHC.   2. HTN: elevated during stress test, but was well controlled prior. Continue to monitor. Continue diltiazem.   3. HLD: LDL significantly improved from 8 months ago, but remains elevated at 137. He is currently on Pravastatin. Consider switch to either Lipitor or Crestor for better LDL reduction. If [employer], he qualifies for the $3 Crestor Copay card.      Brittainy M. Ladoris Gene 10/20/2014 9:27 AM  Personally seen and examined. Agree with above. Anxious. Felt pain like this once before when running in the snow.  Await NUC read. As above.  No rubs.  Candee Furbish, MD

## 2014-10-21 DIAGNOSIS — N401 Enlarged prostate with lower urinary tract symptoms: Secondary | ICD-10-CM | POA: Diagnosis not present

## 2014-10-21 DIAGNOSIS — E785 Hyperlipidemia, unspecified: Secondary | ICD-10-CM | POA: Diagnosis not present

## 2014-10-21 DIAGNOSIS — R079 Chest pain, unspecified: Secondary | ICD-10-CM

## 2014-10-21 DIAGNOSIS — Z720 Tobacco use: Secondary | ICD-10-CM | POA: Diagnosis not present

## 2014-10-21 DIAGNOSIS — F419 Anxiety disorder, unspecified: Secondary | ICD-10-CM | POA: Diagnosis not present

## 2014-10-21 DIAGNOSIS — R0789 Other chest pain: Secondary | ICD-10-CM | POA: Diagnosis not present

## 2014-10-21 DIAGNOSIS — R351 Nocturia: Secondary | ICD-10-CM

## 2014-10-21 MED ORDER — ACETAMINOPHEN 325 MG PO TABS: 650.0000 mg | ORAL_TABLET | ORAL | Status: DC | PRN

## 2014-10-21 MED ORDER — ALPRAZOLAM 0.25 MG PO TABS: 0.2500 mg | ORAL_TABLET | Freq: Two times a day (BID) | ORAL | Status: DC | PRN

## 2014-10-21 MED ORDER — ROSUVASTATIN CALCIUM 40 MG PO TABS: 40.0000 mg | ORAL_TABLET | Freq: Every day | ORAL | Status: DC

## 2014-10-21 NOTE — Discharge Summary (Addendum)
PATIENT DETAILS Name: [name] Age: 54 y.o. Sex: male Date of Birth: [birth year] MRN: [id number]. Admitting Physician: Reyne Dumas, MD SHF:[id number], Sandria Manly, PA-C  Admit Date: 10/19/2014 Discharge date: 10/21/2014  Recommendations for Outpatient Follow-up:  Has left kidney exophytic lesion-please obtain MRI  Has stable lung nodule-please consider periodic CT chest Please repeat LDL in 3-6 months.  PRIMARY DISCHARGE DIAGNOSIS:  Principal Problem:   Chest pain Active Problems:   Dyslipidemia   Essential hypertension   Asthma   GERD   Anxiety   Tobacco abuse   BPH associated with nocturia   Hypothyroidism   Chest pain at rest   D-dimer, elevated      PAST MEDICAL HISTORY: Past Medical History  Diagnosis Date  . Hypertension   . Unspecified intestinal obstruction   . Unspecified asthma(493.90)   . Chest pain, atypical   . Nontoxic multinodular goiter   . Painful respiration   . Dyspnea on exertion   . Trigeminal neuralgia   . Cluster headache   . Hyperlipidemia   . GERD (gastroesophageal reflux disease)   . Complication of anesthesia     difficult waking up"  . Food poisoning 11/2011  . Multiple thyroid nodules   . Hiatal hernia     DISCHARGE MEDICATIONS: Current Discharge Medication List    START taking these medications   Details  acetaminophen (TYLENOL) 325 MG tablet Take 2 tablets (650 mg total) by mouth every 4 (four) hours as needed for headache or mild pain.    ALPRAZolam (XANAX) 0.25 MG tablet Take 1 tablet (0.25 mg total) by mouth 2 (two) times daily as needed for anxiety. Qty: 20 tablet, Refills: 0      CONTINUE these medications which have NOT CHANGED   Details  aspirin 81 MG EC tablet Take 81 mg by mouth daily.      diltiazem (TIAZAC) 360 MG 24 hr capsule TAKE 1 CAPSULE (360 MG TOTAL) BY MOUTH DAILY. Qty: 30 capsule, Refills: 3    fluticasone (FLONASE) 50 MCG/ACT nasal spray Place 2 sprays into both nostrils daily. Qty: 16 g,  Refills: 6   Associated Diagnoses: BPH associated with nocturia    lansoprazole (PREVACID) 30 MG capsule TAKE 1 CAPSULE (30 MG TOTAL) BY MOUTH DAILY. Qty: 30 capsule, Refills: 1    levothyroxine (SYNTHROID, LEVOTHROID) 25 MCG tablet TAKE 1 TABLET (25 MCG TOTAL) BY MOUTH DAILY. Qty: 30 tablet, Refills: 0    pravastatin (PRAVACHOL) 20 MG tablet TAKE 1 TABLET (20 MG TOTAL) BY MOUTH DAILY. NEED APPOINTMENT FOR MORE REFILLS Qty: 30 tablet, Refills: 0    tamsulosin (FLOMAX) 0.4 MG CAPS capsule TAKE 1 CAPSULE (0.4 MG TOTAL) BY MOUTH DAILY AFTER SUPPER. Qty: 30 capsule, Refills: 0        ALLERGIES:  No Known Allergies  BRIEF HPI:  See H&P, Labs, Consult and Test reports for all details in brief, patient is a 54 year old male with history of hypertension, hypothyroidism, gastroesophageal reflux disease who was admitted for evaluation of chest pain.  CONSULTATIONS:   cardiology  PERTINENT RADIOLOGIC STUDIES: Dg Chest 2 View  10/19/2014   CLINICAL DATA:  Chest pain, history of tobacco use  EXAM: CHEST - 2 VIEW  COMPARISON:  07/10/2011  FINDINGS: Cardiac shadow is stable. The lungs are well aerated bilaterally. Mild basilar atelectasis is noted bilaterally. No focal confluent infiltrate is seen. Right shoulder replacement is noted.  IMPRESSION: Mild bibasilar atelectasis.   Electronically Signed   By: Inez Catalina M.D.   On:  10/19/2014 09:01   Ct Angio Chest Pe W/cm &/or Wo Cm  10/19/2014   CLINICAL DATA:  Chest pain, elevated D-dimer level.  EXAM: CT ANGIOGRAPHY CHEST WITH CONTRAST  TECHNIQUE: Multidetector CT imaging of the chest was performed using the standard protocol during bolus administration of intravenous contrast. Multiplanar CT image reconstructions and MIPs were obtained to evaluate the vascular anatomy.  CONTRAST:  143mL OMNIPAQUE IOHEXOL 350 MG/ML SOLN  COMPARISON:  CT scan of June 21, 2010.  FINDINGS: No pneumothorax or pleural effusion is noted. Minimal bilateral posterior  basilar subsegmental atelectasis is noted. 5 mm nodule is noted in right upper lobe which is not significantly changed compared to prior exam. No further follow-up is required. There is no evidence of pulmonary embolus. There is no evidence of thoracic aortic dissection or aneurysm. No mediastinal mass or adenopathy is noted.  Visualized portion of the upper abdomen demonstrates 16 mm exophytic abnormality arising from upper pole of left kidney.  Review of the MIP images confirms the above findings.  IMPRESSION: Stable right upper lobe nodule compared to prior exam. This can be considered benign without further follow-up required.  There is no evidence of pulmonary embolus.  16 mm exophytic abnormality arising from upper pole of left kidney which may represent hyperdense cyst or mass. Renal ultrasound is recommended for further evaluation.   Electronically Signed   By: Marijo Conception, M.D.   On: 10/19/2014 21:04   Nm Myocar Multi W/spect W/wall Motion / Ef  10/20/2014    Blood pressure demonstrated a hypertensive response to exercise.  There was no ST segment deviation noted during stress.  No T wave inversion was noted during stress.  The left ventricular ejection fraction is moderately decreased (30-44%).  Nuclear stress EF: 44%.  The study is normal.  This is an intermediate risk study.      PERTINENT LAB RESULTS: CBC:  Recent Labs  10/19/14 0818  WBC 10.8*  HGB 15.1  HCT 44.2  PLT 170   CMET CMP     Component Value Date/Time   NA 139 10/19/2014 0815   K 3.6 10/19/2014 0815   CL 96* 10/19/2014 0815   CO2 30 10/19/2014 0815   GLUCOSE 147* 10/19/2014 0815   BUN 9 10/19/2014 0815   CREATININE 0.90 10/19/2014 0815   CALCIUM 9.4 10/19/2014 0815   PROT 7.3 02/04/2014 0830   ALBUMIN 3.5 02/04/2014 0830   AST 24 02/04/2014 0830   ALT 22 02/04/2014 0830   ALKPHOS 66 02/04/2014 0830   BILITOT 0.6 02/04/2014 0830   GFRNONAA >60 10/19/2014 0815   GFRAA >60 10/19/2014 0815     GFR Estimated Creatinine Clearance: 119.5 mL/min (by C-G formula based on Cr of 0.9). No results for input(s): LIPASE, AMYLASE in the last 72 hours.  Recent Labs  10/19/14 1438 10/19/14 1924 10/20/14 0300  TROPONINI <0.03 <0.03 <0.03   Invalid input(s): POCBNP  Recent Labs  10/19/14 1521  DDIMER 2.22*   No results for input(s): HGBA1C in the last 72 hours.  Recent Labs  10/20/14 0300  CHOL 203*  HDL 52  LDLCALC 137*  TRIG 71  CHOLHDL 3.9   No results for input(s): TSH, T4TOTAL, T3FREE, THYROIDAB in the last 72 hours.  Invalid input(s): FREET3 No results for input(s): VITAMINB12, FOLATE, FERRITIN, TIBC, IRON, RETICCTPCT in the last 72 hours. Coags: No results for input(s): INR in the last 72 hours.  Invalid input(s): PT Microbiology: No results found for this or any previous visit (  from the past 240 hour(s)).   BRIEF HOSPITAL COURSE:  Chest pain: Felt to be atypical. Underwent workup with a CT angiogram of the chest which was negative for pulmonary embolism or aortic dissection. Underwent nuclear stress test which was negative as well. Seen by cardiology, felt to require no further workup. Continues to have intermittent left-sided chest pain, have asked patient to take NSAIDs or Tylenol. Further workup will be deferred to the outpatient setting. Suspect a big component of his symptoms is also likely secondary to anxiety.  Hypertension: Continue Cardizem  Tobacco abuse: Counseled extensively.  Dyslipidemia: Continue statin.  GERD: Continue PPI  Anxiety: Suspect big part of his symptoms felt to be related to anxiety. 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 He looks visibly anxious at times. He apparently was on as needed Xanax in the past-I will provide him with with a prescription for Xanax discharge. I will defer to his PCP to see if he needs further care including a psychiatry referral.  Hypothyroidism: Continue Synthroid  BPH:  Continue Flomax  Left kidney lesion: Found to have an incidental 16 mm exophytic lesion in the left kidney. Have instructed patient to let his PCP notes regarding the outpatient MRI. Patient is aware that this could be malignancy in rare cases.  Lung nodule: Appears be stable. Please consider periodic CT chest according to guidelines. Is a smoker.  TODAY-DAY OF DISCHARGE:  Subjective:   [name] today and continues to have intermittent left-sided chest pain.  Objective:   Blood pressure 121/76, pulse 69, temperature 98.1 F (36.7 C), temperature source Oral, resp. rate 18, height 5' 10.5" (1.791 m), weight 106.1 kg (233 lb 14.5 oz), SpO2 100 %.  Intake/Output Summary (Last 24 hours) at 10/21/14 1214 Last data filed at 10/21/14 0959  Gross per 24 hour  Intake    820 ml  Output      0 ml  Net    820 ml   Filed Weights   10/19/14 1248 10/20/14 0701 10/21/14 0556  Weight: 105.824 kg (233 lb 4.8 oz) 105.597 kg (232 lb 12.8 oz) 106.1 kg (233 lb 14.5 oz)    Exam Awake Alert, Oriented *3, No new F.N deficits, Normal affect [place].AT,PERRAL Supple Neck,No JVD, No cervical lymphadenopathy appriciated.  Symmetrical Chest wall movement, Good air movement bilaterally, CTAB RRR,No Gallops,Rubs or new Murmurs, No Parasternal Heave +ve B.Sounds, Abd Soft, Non tender, No organomegaly appriciated, No rebound -guarding or rigidity. No Cyanosis, Clubbing or edema, No new Rash or bruise  DISCHARGE CONDITION: Stable  DISPOSITION: Home   DISCHARGE INSTRUCTIONS:    Activity:  As tolerated   Diet recommendation: Heart Healthy diet   Discharge Instructions    Call MD for:  severe uncontrolled pain    Complete by:  As directed      Diet - low sodium heart healthy    Complete by:  As directed      Discharge instructions    Complete by:  As directed   Please ask your primary care M.D. to do a MRI of the left kidney-you had a 16 mm exophytic abnormality seen on the left kidney.  Please make sure you get an MRI in the next few weeks-and rare cases this could be even cancerous.  You also have a lung nodule-please let her primary care M.D. known that you will need periodic CT scans of the chest     Increase activity slowly    Complete by:  As directed  Follow-up Information    Follow up with Leeanne Rio, PA-C. Schedule an appointment as soon as possible for a visit in 1 week.   Specialty:  Physician Assistant   Contact information:   [address] [place] [address] [phone]       Total Time spent on discharge equals 25 minutes.  SignedOren Binet 10/21/2014 12:14 PM

## 2014-10-21 NOTE — Progress Notes (Signed)
Patient Name: [name] Date of Encounter: 10/21/2014  Patient Profile: 54 year old African-American male with past medical history of HTN, HLD, hypothyroidism, GERD, nonobstructive CAD on prior cath, admitted for chest pain.   Principal Problem:   Chest pain Active Problems:   Dyslipidemia   Essential hypertension   Asthma   GERD   Anxiety   Tobacco abuse   BPH associated with nocturia   Hypothyroidism   Chest pain at rest   D-dimer, elevated    SUBJECTIVE  Continue to have intermittent CP. Had some CP during exercise on treadmill yesterday, also have recurrent CP this morning after going to the bathroom.   CURRENT MEDS . aspirin EC  325 mg Oral Daily  . diltiazem  360 mg Oral Daily  . enoxaparin (LOVENOX) injection  40 mg Subcutaneous Q24H  . fluticasone  2 spray Each Nare Daily  . gi cocktail  30 mL Oral TID PC  . levothyroxine  25 mcg Oral QAC breakfast  . pantoprazole  20 mg Oral Daily  . rosuvastatin  40 mg Oral q1800  . tamsulosin  0.4 mg Oral QPC supper    OBJECTIVE  Filed Vitals:   10/20/14 1035 10/20/14 2028 10/21/14 0556 10/21/14 0959  BP: 126/68 114/64 133/92 121/76  Pulse: 68 61 72 69  Temp:  98.1 F (36.7 C) 98.1 F (36.7 C)   TempSrc:  Oral Oral   Resp: 18 18 19 18   Height:      Weight:   233 lb 14.5 oz (106.1 kg)   SpO2: 95% 96% 96% 100%    Intake/Output Summary (Last 24 hours) at 10/21/14 1019 Last data filed at 10/21/14 0959  Gross per 24 hour  Intake   1160 ml  Output      0 ml  Net   1160 ml   Filed Weights   10/19/14 1248 10/20/14 0701 10/21/14 0556  Weight: 233 lb 4.8 oz (105.824 kg) 232 lb 12.8 oz (105.597 kg) 233 lb 14.5 oz (106.1 kg)    PHYSICAL EXAM  General: Pleasant, NAD. Neuro: Alert and oriented X 3. Moves all extremities spontaneously. Psych: Normal affect. HEENT:  Normal  Neck: Supple without bruits or JVD. Lungs:  Resp regular and unlabored, CTA. Heart: RRR no s3, s4, or murmurs. Abdomen: Soft,  non-tender, non-distended, BS + x 4.  Extremities: No clubbing, cyanosis or edema. DP/PT/Radials 2+ and equal bilaterally.  Accessory Clinical Findings  CBC  Recent Labs  10/19/14 0818  WBC 10.8*  NEUTROABS 6.1  HGB 15.1  HCT 44.2  MCV 90.4  PLT 559   Basic Metabolic Panel  Recent Labs  10/19/14 0815  NA 139  K 3.6  CL 96*  CO2 30  GLUCOSE 147*  BUN 9  CREATININE 0.90  CALCIUM 9.4   Cardiac Enzymes  Recent Labs  10/19/14 1438 10/19/14 1924 10/20/14 0300  TROPONINI <0.03 <0.03 <0.03   BNP Invalid input(s): POCBNP D-Dimer  Recent Labs  10/19/14 1521  DDIMER 2.22*   Hemoglobin A1C No results for input(s): HGBA1C in the last 72 hours. Fasting Lipid Panel  Recent Labs  10/20/14 0300  CHOL 203*  HDL 52  LDLCALC 137*  TRIG 71  CHOLHDL 3.9     ECG  No new EKG  Echocardiogram 10/19/2014  LV EF: 60% -  65%  ------------------------------------------------------------------- Indications:   Chest pain 786.51.  ------------------------------------------------------------------- History:  Risk factors: Hypertension.  ------------------------------------------------------------------- Study Conclusions  - Left ventricle: The cavity size was normal. Wall thickness was  increased in a pattern of mild LVH. Systolic function was normal. The estimated ejection fraction was in the range of 60% to 65%. Wall motion was normal; there were no regional wall motion abnormalities. The study is not technically sufficient to allow evaluation of LV diastolic function. - Right ventricle: The cavity size was mildly dilated. - Right atrium: The atrium was mildly to moderately dilated.    Radiology/Studies  Dg Chest 2 View  10/19/2014   CLINICAL DATA:  Chest pain, history of tobacco use  EXAM: CHEST - 2 VIEW  COMPARISON:  07/10/2011  FINDINGS: Cardiac shadow is stable. The lungs are well aerated bilaterally. Mild basilar atelectasis is noted  bilaterally. No focal confluent infiltrate is seen. Right shoulder replacement is noted.  IMPRESSION: Mild bibasilar atelectasis.   Electronically Signed   By: Inez Catalina M.D.   On: 10/19/2014 09:01   Ct Angio Chest Pe W/cm &/or Wo Cm  10/19/2014   CLINICAL DATA:  Chest pain, elevated D-dimer level.  EXAM: CT ANGIOGRAPHY CHEST WITH CONTRAST  TECHNIQUE: Multidetector CT imaging of the chest was performed using the standard protocol during bolus administration of intravenous contrast. Multiplanar CT image reconstructions and MIPs were obtained to evaluate the vascular anatomy.  CONTRAST:  131mL OMNIPAQUE IOHEXOL 350 MG/ML SOLN  COMPARISON:  CT scan of June 21, 2010.  FINDINGS: No pneumothorax or pleural effusion is noted. Minimal bilateral posterior basilar subsegmental atelectasis is noted. 5 mm nodule is noted in right upper lobe which is not significantly changed compared to prior exam. No further follow-up is required. There is no evidence of pulmonary embolus. There is no evidence of thoracic aortic dissection or aneurysm. No mediastinal mass or adenopathy is noted.  Visualized portion of the upper abdomen demonstrates 16 mm exophytic abnormality arising from upper pole of left kidney.  Review of the MIP images confirms the above findings.  IMPRESSION: Stable right upper lobe nodule compared to prior exam. This can be considered benign without further follow-up required.  There is no evidence of pulmonary embolus.  16 mm exophytic abnormality arising from upper pole of left kidney which may represent hyperdense cyst or mass. Renal ultrasound is recommended for further evaluation.   Electronically Signed   By: Marijo Conception, M.D.   On: 10/19/2014 21:04   Nm Myocar Multi W/spect W/wall Motion / Ef  10/20/2014    Blood pressure demonstrated a hypertensive response to exercise.  There was no ST segment deviation noted during stress.  No T wave inversion was noted during stress.  The left ventricular  ejection fraction is moderately decreased (30-44%).  Nuclear stress EF: 44%.  The study is normal.  This is an intermediate risk study.     ASSESSMENT AND PLAN  1. Chest Pain:   - elevated D-dimer but CT of chest negative for PE or dissection   - Cardiac enzymes negative x 3. 2D echo 10/19/2014 with normal LVEF 60-65% and wall motion. No significant valvular disease. Pericardium normal.   - treadmill myoview 10/20/2014 EF 44%, no ST segment during stress, no reversible ischemia or infarct, intermediate risk stress test finding given decreased EF (even though echo shows normal EF 1 day prior)  - Dr. Meda Coffee originally wanted to do a cardiac MRI initially to r/o pericarditis, however pt was claustrophobic, given recurrent CP, may do a trial of NSAID or colchicine. Will discuss with MD.  2. HTN: elevated during stress test, but was well controlled prior. Continue to monitor. Continue diltiazem.  3. HLD: LDL significantly improved from 8 months ago, but remains elevated at 137. He is currently on Pravastatin. Consider switch to either Lipitor or Crestor for better LDL reduction.   Hilbert Corrigan PA-C Pager: [phone]  Personally seen and examined. Agree with above. No ischemia on stress test. Do not agree with EF on NUC. ECHO was normal.  OK with DC OK with trial of NSAIDS (ibuprofen 600 TID for 7 days perhaps) in case of inflammatory etiology)  Candee Furbish, MD

## 2014-10-21 NOTE — Progress Notes (Signed)
Pt has orders to be discharged. Discharge instructions given and pt has no additional questions at this time. Medication regimen reviewed and pt educated. Pt verbalized understanding and has no additional questions. Telemetry box removed. IV removed and site in good condition. Pt stable and waiting for transportation.   Shaneeka Scarboro RN 

## 2014-10-21 NOTE — Progress Notes (Signed)
Paged Dr. Sloan Leiter regarding pt complaints of "fingers tingling and numb". No new orders received. Will continue to monitor pt.       Maurene Capes RN

## 2014-10-21 NOTE — Discharge Instructions (Signed)
Please ask your primary care M.D. to do a MRI of the left kidney-you had a 16 mm exophytic abnormality seen on the left kidney. Please make sure you get an MRI in the next few weeks-and rare cases this could be even cancerous.  You also have a lung nodule-please let her primary care M.D. known that you will need periodic CT scans of the chest

## 2014-10-23 ENCOUNTER — Telehealth: Payer: Self-pay

## 2014-10-23 ENCOUNTER — Other Ambulatory Visit: Payer: Self-pay | Admitting: Physician Assistant

## 2014-10-23 ENCOUNTER — Other Ambulatory Visit: Payer: Self-pay | Admitting: Internal Medicine

## 2014-10-23 NOTE — Telephone Encounter (Signed)
Pt returning your call for hospital follow up. Please call [phone].

## 2014-10-23 NOTE — Telephone Encounter (Signed)
Should calm down soon -- keep humidifier in bedroom.  Can use Afrin nasal spray to stop acute nose bleed if needed but do not use more than 3 days in a row.  Also BP should be checked at home or by Pharmacist. Follow-up sooner if symptoms not resolving. Otherwise will see at visit.

## 2014-10-23 NOTE — Telephone Encounter (Signed)
FYI- patient states he had a nosebleed Wednesday and Thursday night.  Only takes daily aspirin 81mg  and is using Flonase.  Is there anything else he should be worried about?    Hospital f/u scheduled 10/28/14

## 2014-10-23 NOTE — Telephone Encounter (Signed)
EZB:MZTAEW, Sandria Manly, PA-C  Admit Date: 10/19/2014 Discharge date: 10/21/2014  Reason for admission:  Atypical Chest Pain  Left a message for call back.

## 2014-10-23 NOTE — Telephone Encounter (Signed)
Admit Date: 10/19/2014 Discharge date: 10/21/2014  Recommendations for Outpatient Follow-up:  Has left kidney exophytic lesion-please obtain MRI  Has stable lung nodule-please consider periodic CT chest Please repeat LDL in 3-6 months.  Transition Care Management Follow-up Telephone Call  How have you been since you were released from the hospital? "Not as bad as I was, but the pain is still there"   Do you understand why you were in the hospital? YES- chest pain   Do you understand the discharge instrcutions? YES  Items Reviewed:  Medications reviewed: YES   Allergies reviewed: YES- NKA   Dietary changes reviewed: YES- heart healthy, patient endorses limiting salt for his blood pressure   Referrals reviewed: YES   Functional Questionnaire:   Activities of Daily Living (ADLs):   He states they are independent in the following: all  States they require assistance with the following: none    Any transportation issues/concerns?: NO   Any patient concerns? YES- patient states he had a small nosebleed Wednesday night.  Then, Thusday he had another nosebleed and noticed it was down his shirt.  It has been under control since that time.     Confirmed importance and date/time of follow-up visits scheduled: YES- scheduled with Elyn Aquas, PA 10/28/14.    Confirmed with patient if condition begins to worsen call PCP or go to the ER.  Patient was given the Call-a-Nurse line [phone]: YES

## 2014-10-28 ENCOUNTER — Encounter: Payer: Self-pay | Admitting: Physician Assistant

## 2014-10-28 ENCOUNTER — Ambulatory Visit (INDEPENDENT_AMBULATORY_CARE_PROVIDER_SITE_OTHER): Payer: BLUE CROSS/BLUE SHIELD | Admitting: Physician Assistant

## 2014-10-28 VITALS — BP 114/71 | HR 89 | Temp 97.6°F | Ht 70.5 in | Wt 238.8 lb

## 2014-10-28 DIAGNOSIS — R931 Abnormal findings on diagnostic imaging of heart and coronary circulation: Secondary | ICD-10-CM

## 2014-10-28 DIAGNOSIS — N2889 Other specified disorders of kidney and ureter: Secondary | ICD-10-CM

## 2014-10-28 DIAGNOSIS — R0989 Other specified symptoms and signs involving the circulatory and respiratory systems: Secondary | ICD-10-CM | POA: Diagnosis not present

## 2014-10-28 DIAGNOSIS — R0789 Other chest pain: Secondary | ICD-10-CM | POA: Diagnosis not present

## 2014-10-28 MED ORDER — RANITIDINE HCL 150 MG PO CAPS: 150.0000 mg | ORAL_CAPSULE | Freq: Two times a day (BID) | ORAL | Status: DC

## 2014-10-28 MED ORDER — PAROXETINE HCL 20 MG PO TABS: ORAL_TABLET | ORAL | Status: DC

## 2014-10-28 NOTE — Patient Instructions (Signed)
You will be contacted for an MRI to further assess the spot on your kidney. I will call you with your results.  Please start the Paxil as directed. Continue your Prevacid but add-on a Zantac at night (A prescription has been sent in). You will be contacted by Cardiology for an appointment. Please go to this.  Follow-up with me in 3-4 weeks. If anything recurs or is severe, please go to the ER.

## 2014-10-28 NOTE — Progress Notes (Signed)
Pre visit review using our clinic review tool, if applicable. No additional management support is needed unless otherwise documented below in the visit note. 

## 2014-11-02 DIAGNOSIS — R0989 Other specified symptoms and signs involving the circulatory and respiratory systems: Secondary | ICD-10-CM | POA: Insufficient documentation

## 2014-11-02 DIAGNOSIS — R931 Abnormal findings on diagnostic imaging of heart and coronary circulation: Secondary | ICD-10-CM | POA: Insufficient documentation

## 2014-11-02 DIAGNOSIS — N2889 Other specified disorders of kidney and ureter: Secondary | ICD-10-CM | POA: Insufficient documentation

## 2014-11-02 NOTE — Assessment & Plan Note (Signed)
CTA negative for PE or dissection. Anxiety felt to be cause of symptoms and I do agree that it is playing a part, especially giving improvement with Xanax. Unfortunately patient has history of narcotic abuse and can not receive controlled medications from this practice. Do not feel xanax is the best option for him regardless of this. Will begin Paxil 20 mg daily for anxiety. Patient to continue GERD medications as directed. Has been referred to Cards giving decreased EF of Nuclear Stress Test. Follow-up 3-4 weeks.

## 2014-11-02 NOTE — Assessment & Plan Note (Signed)
Concern for RCC giving noted mass and smoking history. Will obtain MRI to assess.

## 2014-11-02 NOTE — Progress Notes (Signed)
Patient presents to clinic today for hospital follow-up of chest pain. Patient presented to ER on 10/19/14 c/o chest pain. Workup including EKG and blood work without abnormal findings. Giving complaints and medical history, patient was admitted to telemetry unit for further assessment. D-dimer obtained and high. CTA performed that was negative for pulmonary embolus or aortic dissection. CTA did note stable pulmonary nodule and exophytic lesion of left kidney in this smoker. Nuclear Stress Test performed revealing no ischemia but reduced EF at 30-44%.  Anxiety felt to be key player in patient's chest pain. Patient was given xanax for acute anxiety and told to follow-up with PCP regarding further assessment.  Since discharge patient notes doing better with pain. Notes it is happening rarely but is controlled with Xanax. Denies SOB or palpitations. Patient states that this episode scared him so he has stopped all alcohol and tobacco products and has been doing well. Wishes to discuss management of anxiety further, as well as to get further imaging for renal lesion.  Past Medical History  Diagnosis Date  . Hypertension   . Unspecified intestinal obstruction   . Unspecified asthma(493.90)   . Chest pain, atypical   . Nontoxic multinodular goiter   . Painful respiration   . Dyspnea on exertion   . Trigeminal neuralgia   . Cluster headache   . Hyperlipidemia   . GERD (gastroesophageal reflux disease)   . Complication of anesthesia     difficult waking up"  . Food poisoning 11/2011  . Multiple thyroid nodules   . Hiatal hernia     Current Outpatient Prescriptions on File Prior to Visit  Medication Sig Dispense Refill  . acetaminophen (TYLENOL) 325 MG tablet Take 2 tablets (650 mg total) by mouth every 4 (four) hours as needed for headache or mild pain.    Marland Kitchen aspirin 81 MG EC tablet Take 81 mg by mouth daily.      Marland Kitchen diltiazem (TIAZAC) 360 MG 24 hr capsule TAKE 1 CAPSULE (360 MG TOTAL) BY MOUTH  DAILY. 30 capsule 3  . fluticasone (FLONASE) 50 MCG/ACT nasal spray Place 2 sprays into both nostrils daily. (Patient taking differently: Place 2 sprays into both nostrils daily as needed for allergies or rhinitis. ) 16 g 6  . lansoprazole (PREVACID) 30 MG capsule TAKE 1 CAPSULE (30 MG TOTAL) BY MOUTH DAILY. 30 capsule 0  . levothyroxine (SYNTHROID, LEVOTHROID) 25 MCG tablet TAKE 1 TABLET (25 MCG TOTAL) BY MOUTH DAILY. 30 tablet 3  . pravastatin (PRAVACHOL) 20 MG tablet TAKE 1 TABLET (20 MG TOTAL) BY MOUTH DAILY. NEED APPOINTMENT FOR MORE REFILLS 30 tablet 3  . tamsulosin (FLOMAX) 0.4 MG CAPS capsule TAKE 1 CAPSULE (0.4 MG TOTAL) BY MOUTH DAILY AFTER SUPPER. (Patient taking differently: TAKE 1 CAPSULE (0.4 MG TOTAL) BY MOUTH DAILY AFTER SUPPER) 30 capsule 0   No current facility-administered medications on file prior to visit.    No Known Allergies  Family History  Problem Relation Age of Onset  . Diabetes Mother   . Hyperlipidemia Mother   . Hypertension Mother   . Hypertension Father   . Heart disease Father     CAD/CHF  . Cancer Father     Mesothelioma  . Heart disease Maternal Aunt   . Prostate cancer Neg Hx   . Colon cancer Neg Hx     History   Social History  . Marital Status: Married    Spouse Name: N/A  . Number of Children: N/A  . Years  of Education: N/A   Occupational History  . [employer]    Social History Main Topics  . Smoking status: Current Every Day Smoker -- 0.25 packs/day for 18 years    Types: Cigarettes  . Smokeless tobacco: Never Used     Comment: 07/10/13 1/2 ppd  . Alcohol Use: No     Comment: rare  . Drug Use: No  . Sexual Activity: Not Currently   Other Topics Concern  . None   Social History Narrative   [employer]   [employer] school in [place]   Married - '84 - 5 years divorced; remarried '96   1 son - '85      Regular Exercise -  NO          Review of Systems - See HPI.  All other ROS are negative.  BP 114/71 mmHg   Pulse 89  Temp(Src) 97.6 F (36.4 C) (Oral)  Ht 5' 10.5" (1.791 m)  Wt 238 lb 12.8 oz (108.319 kg)  BMI 33.77 kg/m2  SpO2 97%  Physical Exam  Constitutional: He is oriented to person, place, and time and well-developed, well-nourished, and in no distress.  HENT:  Head: Normocephalic and atraumatic.  Right Ear: External ear normal.  Left Ear: External ear normal.  Nose: Nose normal.  Mouth/Throat: Oropharynx is clear and moist. No oropharyngeal exudate.  TM within normal limits bilaterally.  Eyes: Conjunctivae are normal.  Neck: Neck supple.  Cardiovascular: Normal rate, regular rhythm, normal heart sounds and intact distal pulses.   Pulmonary/Chest: Effort normal and breath sounds normal. No respiratory distress. He has no wheezes. He has no rales. He exhibits no tenderness.  Lymphadenopathy:    He has no cervical adenopathy.  Neurological: He is alert and oriented to person, place, and time.  Skin: Skin is warm and dry. No rash noted.  Psychiatric: Affect normal.  Vitals reviewed.   Recent Results (from the past 2160 hour(s))  Basic metabolic panel     Status: Abnormal   Collection Time: 10/19/14  8:15 AM  Result Value Ref Range   Sodium 139 135 - 145 mmol/L   Potassium 3.6 3.5 - 5.1 mmol/L   Chloride 96 (L) 101 - 111 mmol/L   CO2 30 22 - 32 mmol/L   Glucose, Bld 147 (H) 65 - 99 mg/dL   BUN 9 6 - 20 mg/dL   Creatinine, Ser 0.90 0.61 - 1.24 mg/dL   Calcium 9.4 8.9 - 10.3 mg/dL   GFR calc non Af Amer >60 >60 mL/min   GFR calc Af Amer >60 >60 mL/min    Comment: (NOTE) The eGFR has been calculated using the CKD EPI equation. This calculation has not been validated in all clinical situations. eGFR's persistently <60 mL/min signify possible Chronic Kidney Disease.    Anion gap 13 5 - 15  Troponin I     Status: None   Collection Time: 10/19/14  8:15 AM  Result Value Ref Range   Troponin I <0.03 <0.031 ng/mL    Comment:        NO INDICATION OF MYOCARDIAL INJURY.     CBC with Differential     Status: Abnormal   Collection Time: 10/19/14  8:18 AM  Result Value Ref Range   WBC 10.8 (H) 4.0 - 10.5 K/uL   RBC 4.89 4.22 - 5.81 MIL/uL   Hemoglobin 15.1 13.0 - 17.0 g/dL   HCT 44.2 39.0 - 52.0 %   MCV 90.4 78.0 - 100.0 fL  MCH 30.9 26.0 - 34.0 pg   MCHC 34.2 30.0 - 36.0 g/dL   RDW 12.9 11.5 - 15.5 %   Platelets 170 150 - 400 K/uL   Neutrophils Relative % 56 43 - 77 %   Neutro Abs 6.1 1.7 - 7.7 K/uL   Lymphocytes Relative 32 12 - 46 %   Lymphs Abs 3.5 0.7 - 4.0 K/uL   Monocytes Relative 11 3 - 12 %   Monocytes Absolute 1.1 (H) 0.1 - 1.0 K/uL   Eosinophils Relative 1 0 - 5 %   Eosinophils Absolute 0.1 0.0 - 0.7 K/uL   Basophils Relative 0 0 - 1 %   Basophils Absolute 0.0 0.0 - 0.1 K/uL  Troponin I (q 6hr x 3)     Status: None   Collection Time: 10/19/14  2:38 PM  Result Value Ref Range   Troponin I <0.03 <0.031 ng/mL    Comment:        NO INDICATION OF MYOCARDIAL INJURY.   D-dimer, quantitative (not at [facility])     Status: Abnormal   Collection Time: 10/19/14  3:21 PM  Result Value Ref Range   D-Dimer, Quant 2.22 (H) 0.00 - 0.48 ug/mL-FEU    Comment:        AT THE INHOUSE ESTABLISHED CUTOFF VALUE OF 0.48 ug/mL FEU, THIS ASSAY HAS BEEN DOCUMENTED IN THE LITERATURE TO HAVE A SENSITIVITY AND NEGATIVE PREDICTIVE VALUE OF AT LEAST 98 TO 99%.  THE TEST RESULT SHOULD BE CORRELATED WITH AN ASSESSMENT OF THE CLINICAL PROBABILITY OF DVT / VTE.   Troponin I (q 6hr x 3)     Status: None   Collection Time: 10/19/14  7:24 PM  Result Value Ref Range   Troponin I <0.03 <0.031 ng/mL    Comment:        NO INDICATION OF MYOCARDIAL INJURY.   Troponin I (q 6hr x 3)     Status: None   Collection Time: 10/20/14  3:00 AM  Result Value Ref Range   Troponin I <0.03 <0.031 ng/mL    Comment:        NO INDICATION OF MYOCARDIAL INJURY.   Lipid panel     Status: Abnormal   Collection Time: 10/20/14  3:00 AM  Result Value Ref Range   Cholesterol 203 (H) 0  - 200 mg/dL   Triglycerides 71 <150 mg/dL   HDL 52 >40 mg/dL   Total CHOL/HDL Ratio 3.9 RATIO   VLDL 14 0 - 40 mg/dL   LDL Cholesterol 137 (H) 0 - 99 mg/dL    Comment:        Total Cholesterol/HDL:CHD Risk Coronary Heart Disease Risk Table                     Men   Women  1/2 Average Risk   3.4   3.3  Average Risk       5.0   4.4  2 X Average Risk   9.6   7.1  3 X Average Risk  23.4   11.0        Use the calculated Patient Ratio above and the CHD Risk Table to determine the patient's CHD Risk.        ATP III CLASSIFICATION (LDL):  <100     mg/dL   Optimal  100-129  mg/dL   Near or Above                    Optimal  130-159  mg/dL   Borderline  160-189  mg/dL   High  >190     mg/dL   Very High   NM Myocar Multi W/Spect W/Wall Motion / EF     Status: None   Collection Time: 10/20/14 10:40 AM  Result Value Ref Range   Rest HR 68 bpm   Rest BP 150/89 mmHg   Exercise duration (min) 7 min   Exercise duration (sec) 0 sec   Estimated workload 8.5 METS   Post peak HR 148 bpm   Post peak BP 189/79 mmHg   MPHR 169 bpm   Percent HR 87 %   RPE     LV Systolic Volume 70 mL   TID 8.12    LV Diastolic Volume 751 mL   Rate 0.32    SSS 0    SRS 0    SDS 0     Assessment/Plan: Left ventricular ejection fraction less than 40% Noted on Nuclear stress test despite Hospitalist note that study unremarkable. Will refer to Cardiology for further assessment as patient with stable BP.  Renal mass, left Concern for RCC giving noted mass and smoking history. Will obtain MRI to assess.  Atypical chest pain CTA negative for PE or dissection. Anxiety felt to be cause of symptoms and I do agree that it is playing a part, especially giving improvement with Xanax. Unfortunately patient has history of narcotic abuse and can not receive controlled medications from this practice. Do not feel xanax is the best option for him regardless of this. Will begin Paxil 20 mg daily for anxiety. Patient to  continue GERD medications as directed. Has been referred to Cards giving decreased EF of Nuclear Stress Test. Follow-up 3-4 weeks.

## 2014-11-02 NOTE — Assessment & Plan Note (Signed)
Noted on Nuclear stress test despite Hospitalist note that study unremarkable. Will refer to Cardiology for further assessment as patient with stable BP.

## 2014-11-16 ENCOUNTER — Ambulatory Visit (INDEPENDENT_AMBULATORY_CARE_PROVIDER_SITE_OTHER): Payer: BLUE CROSS/BLUE SHIELD | Admitting: Internal Medicine

## 2014-11-16 ENCOUNTER — Encounter: Payer: Self-pay | Admitting: Internal Medicine

## 2014-11-16 VITALS — BP 140/78 | HR 79 | Ht 70.5 in | Wt 234.2 lb

## 2014-11-16 DIAGNOSIS — I10 Essential (primary) hypertension: Secondary | ICD-10-CM

## 2014-11-16 DIAGNOSIS — R0789 Other chest pain: Secondary | ICD-10-CM | POA: Diagnosis not present

## 2014-11-16 DIAGNOSIS — R079 Chest pain, unspecified: Secondary | ICD-10-CM | POA: Diagnosis not present

## 2014-11-16 DIAGNOSIS — Z01812 Encounter for preprocedural laboratory examination: Secondary | ICD-10-CM | POA: Diagnosis not present

## 2014-11-16 LAB — CBC WITH DIFFERENTIAL/PLATELET
Basophils Absolute: 0 10*3/uL (ref 0.0–0.1)
Basophils Relative: 0.4 % (ref 0.0–3.0)
Eosinophils Absolute: 0.1 10*3/uL (ref 0.0–0.7)
Eosinophils Relative: 1.2 % (ref 0.0–5.0)
HCT: 43.8 % (ref 39.0–52.0)
Hemoglobin: 14.6 g/dL (ref 13.0–17.0)
Lymphocytes Relative: 31.3 % (ref 12.0–46.0)
Lymphs Abs: 3.8 10*3/uL (ref 0.7–4.0)
MCHC: 33.2 g/dL (ref 30.0–36.0)
MCV: 91.8 fl (ref 78.0–100.0)
Monocytes Absolute: 1 10*3/uL (ref 0.1–1.0)
Monocytes Relative: 8 % (ref 3.0–12.0)
Neutro Abs: 7.3 10*3/uL (ref 1.4–7.7)
Neutrophils Relative %: 59.1 % (ref 43.0–77.0)
Platelets: 195 10*3/uL (ref 150.0–400.0)
RBC: 4.77 Mil/uL (ref 4.22–5.81)
RDW: 13.9 % (ref 11.5–15.5)
WBC: 12.3 10*3/uL — ABNORMAL HIGH (ref 4.0–10.5)

## 2014-11-16 LAB — BASIC METABOLIC PANEL
BUN: 12 mg/dL (ref 6–23)
CO2: 30 mEq/L (ref 19–32)
Calcium: 9.1 mg/dL (ref 8.4–10.5)
Chloride: 102 mEq/L (ref 96–112)
Creatinine, Ser: 0.91 mg/dL (ref 0.40–1.50)
GFR: 112.68 mL/min (ref >60.00)
Glucose, Bld: 111 mg/dL — ABNORMAL HIGH (ref 70–99)
Potassium: 3.8 mEq/L (ref 3.5–5.1)
Sodium: 137 mEq/L (ref 135–145)

## 2014-11-16 NOTE — Patient Instructions (Addendum)
  Medication Instructions:  Your physician recommends that you continue on your current medications as directed. Please refer to the Current Medication list given to you today.   Labwork: Your physician recommends that you return for lab work TODAY (CBC, BMET)   Testing/Procedures: Your physician has requested that you have a cardiac catheterization. Cardiac catheterization is used to diagnose and/or treat various heart conditions. Doctors may recommend this procedure for a number of different reasons. The most common reason is to evaluate chest pain. Chest pain can be a symptom of coronary artery disease (CAD), and cardiac catheterization can show whether plaque is narrowing or blocking your heart's arteries. This procedure is also used to evaluate the valves, as well as measure the blood flow and oxygen levels in different parts of your heart. For further information please visit [email or web address]. Please follow instruction sheet, as given.  You are scheduled for a cardiac catheterization on 11/25/2014 with Dr. Tamala Julian or associate.  Go to [facility] [place] and will be directed to Short Stay on 11/25/2014 at 6:30 AM with procedure to start around 8:30 AM.  Enter thru the [facility] entrance A No food or drink after midnight on 11/24/2014. You may take your medications with a sip of water on the day of your procedure.    Follow-Up: Follow up based on catheterization results.  Any Other Special Instructions Will Be Listed Below (If Applicable).

## 2014-11-16 NOTE — Progress Notes (Signed)
HPI [name] is referred today for evaluation of an abnormal stress test. The patient is a 54 yo man with a h/o dyslipidemia and HTN who was admitted with chest pressure and underwent stress testing which demonstrated LV dysfunction with normal coronary perfusion. A subsequent echo was obtained which was normal with regard to LV function. His pain has both typical and atypical components. He has multiple cardiac risk factors including hypertension and dyslipidemia No Known Allergies   Current Outpatient Prescriptions  Medication Sig Dispense Refill  . acetaminophen (TYLENOL) 325 MG tablet Take 2 tablets (650 mg total) by mouth every 4 (four) hours as needed for headache or mild pain.    Marland Kitchen aspirin 81 MG EC tablet Take 81 mg by mouth daily.      Marland Kitchen diltiazem (TIAZAC) 360 MG 24 hr capsule TAKE 1 CAPSULE (360 MG TOTAL) BY MOUTH DAILY. 30 capsule 3  . fluticasone (FLONASE) 50 MCG/ACT nasal spray Place 2 sprays into both nostrils as needed for allergies or rhinitis.    Marland Kitchen lansoprazole (PREVACID) 15 MG capsule Take 1 capsule by mouth as needed.  2  . lansoprazole (PREVACID) 30 MG capsule TAKE 1 CAPSULE (30 MG TOTAL) BY MOUTH DAILY. 30 capsule 0  . levothyroxine (SYNTHROID, LEVOTHROID) 25 MCG tablet TAKE 1 TABLET (25 MCG TOTAL) BY MOUTH DAILY. 30 tablet 3  . PARoxetine (PAXIL) 20 MG tablet Please take 1/2 tablet daily for 1 week. Then take 1 tablet daily. 30 tablet 3  . pravastatin (PRAVACHOL) 20 MG tablet TAKE 1 TABLET (20 MG TOTAL) BY MOUTH DAILY. NEED APPOINTMENT FOR MORE REFILLS 30 tablet 3  . ranitidine (ZANTAC) 150 MG capsule Take 1 capsule (150 mg total) by mouth 2 (two) times daily. 60 capsule 3  . tamsulosin (FLOMAX) 0.4 MG CAPS capsule TAKE 1 CAPSULE (0.4 MG TOTAL) BY MOUTH DAILY AFTER SUPPER. 30 capsule 0   No current facility-administered medications for this visit.     Past Medical History  Diagnosis Date  . Hypertension   . Unspecified intestinal obstruction   .  Unspecified asthma(493.90)   . Chest pain, atypical   . Nontoxic multinodular goiter   . Painful respiration   . Dyspnea on exertion   . Trigeminal neuralgia   . Cluster headache   . Hyperlipidemia   . GERD (gastroesophageal reflux disease)   . Complication of anesthesia     difficult waking up"  . Food poisoning 11/2011  . Multiple thyroid nodules   . Hiatal hernia     ROS:   All systems reviewed and negative except as noted in the HPI.   Past Surgical History  Procedure Laterality Date  . Appendectomy    . Knee arthroscopy    . 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 Right shoulder redo-reconstruction  06/21/10  . Destruction trigeminal nerve via neurolytic agent       Family History  Problem Relation Age of Onset  . Diabetes Mother   . Hyperlipidemia Mother   . Hypertension Mother   . Hypertension Father   . Heart disease Father     CAD/CHF  . Cancer Father     Mesothelioma  . Heart disease Maternal Aunt   . Prostate cancer Neg Hx   . Colon cancer Neg Hx      History   Social History  . Marital Status: Married    Spouse Name: N/A  . Number of Children: N/A  .  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 Smoking status: Current Every Day Smoker -- 0.25 packs/day for 18 years    Types: Cigarettes  . Smokeless tobacco: Never Used     Comment: 07/10/13 1/2 ppd  . Alcohol Use: No     Comment: rare  . Drug Use: No  . Sexual Activity: Not Currently   Other Topics Concern  . Not on file   Social History Narrative   HSG   Culinary school in [place]   Married - '84 - 5 years divorced; remarried '96   1 son - '85      Regular Exercise -  NO           BP 140/78 mmHg  Pulse 79  Ht 5' 10.5" (1.791 m)  Wt 234 lb 3.2 oz (106.232 kg)  BMI 33.12 kg/m2  SpO2 94%  Physical Exam:  Well appearing middle aged man, NAD HEENT: Unremarkable Neck: 6  cm JVD, no thyromegally Back:  No CVA tenderness Lungs:  Clear with no wheezes HEART:  Regular rate rhythm, no murmurs, no rubs, no clicks Abd:  soft, positive bowel sounds, no organomegally, no rebound, no guarding Ext:  2 plus pulses, no edema, no cyanosis, no clubbing Skin:  No rashes no nodules Neuro:  CN II through XII intact, motor grossly intact  EKG - reviewed NSR, no acute STT changes.  Assess/Plan:

## 2014-11-20 ENCOUNTER — Other Ambulatory Visit: Payer: Self-pay | Admitting: Internal Medicine

## 2014-11-20 ENCOUNTER — Other Ambulatory Visit: Payer: Self-pay | Admitting: Physician Assistant

## 2014-11-22 DIAGNOSIS — R0789 Other chest pain: Secondary | ICD-10-CM | POA: Insufficient documentation

## 2014-11-22 NOTE — Assessment & Plan Note (Signed)
He has multiple cardiac risk factors and an intermediate stress test. He has both typical and atypical symptoms. I have discussed the treatment options with the patient including another type of stress test vs proceeding with cardiac catheterization. The patient would like a definitive answer to whether or not he has obstructive CAD. I have discussed the risks/benefits/goals/expectations of cardiac catheterization and he wishes to proceed.

## 2014-11-22 NOTE — Assessment & Plan Note (Signed)
His blood pressure is minimally elevated. Will follow. He will maintain a low sodium diet.

## 2014-11-23 ENCOUNTER — Telehealth: Payer: Self-pay | Admitting: Behavioral Health

## 2014-11-23 ENCOUNTER — Other Ambulatory Visit: Payer: Self-pay

## 2014-11-23 NOTE — Telephone Encounter (Signed)
Patient's voicemail box is full; message could not be left regarding pre-visit call.

## 2014-11-24 ENCOUNTER — Telehealth: Payer: Self-pay | Admitting: Internal Medicine

## 2014-11-24 ENCOUNTER — Encounter: Payer: Self-pay | Admitting: Internal Medicine

## 2014-11-24 ENCOUNTER — Ambulatory Visit (INDEPENDENT_AMBULATORY_CARE_PROVIDER_SITE_OTHER): Payer: BLUE CROSS/BLUE SHIELD | Admitting: Internal Medicine

## 2014-11-24 VITALS — BP 132/84 | HR 77 | Temp 98.2°F | Ht 71.0 in | Wt 227.4 lb

## 2014-11-24 DIAGNOSIS — F419 Anxiety disorder, unspecified: Secondary | ICD-10-CM | POA: Diagnosis not present

## 2014-11-24 DIAGNOSIS — F1920 Other psychoactive substance dependence, uncomplicated: Secondary | ICD-10-CM

## 2014-11-24 DIAGNOSIS — G44029 Chronic cluster headache, not intractable: Secondary | ICD-10-CM | POA: Diagnosis not present

## 2014-11-24 DIAGNOSIS — N2889 Other specified disorders of kidney and ureter: Secondary | ICD-10-CM | POA: Diagnosis not present

## 2014-11-24 DIAGNOSIS — I2510 Atherosclerotic heart disease of native coronary artery without angina pectoris: Secondary | ICD-10-CM | POA: Diagnosis not present

## 2014-11-24 DIAGNOSIS — I10 Essential (primary) hypertension: Secondary | ICD-10-CM

## 2014-11-24 NOTE — Patient Instructions (Addendum)
Take your blood pressure medication every day  If you take Motrin, always take it with food, watch for   nausea, stomach pain, change in the color of stools    Check the  blood pressure 2 or 3 times a week  Be sure your blood pressure is between 110/65 and  145/85.  if it is consistently higher or lower, let me know

## 2014-11-24 NOTE — Progress Notes (Signed)
Subjective:    Patient ID: [name], male    DOB: 12-19-1960, 54 y.o.   MRN: [id number]  DOS:  11/24/2014 Type of visit - description : Here to discuss several issues, I have been listed as  his PCP Interval history: Obesity: Diet has improved significantly, has lost 35 pounds. Exercise is at baseline, not very active. Hypertension, BP has been under excellent control, he has to skip medications workup with a some BPs is still very good, wonders if he needs to continue taking medications Anxiety, self discontinue Paxil because he felt that he "didn't care" about anything while taking it. Reports + stress from work and family issues. History of cluster headaches, still needing Motrin sometimes 800 mg twice or 3 times a day prn Chest pain, schedule to have a cardiac catheterization tomorrow.    Review of Systems Denies depression or suicidal ideas Admits to chest pain few days ago when he was taking a walk, associated with sweats. Sometimes has mild chest pain at rest  Past Medical History  Diagnosis Date  . Hypertension   . Unspecified intestinal obstruction   . Unspecified asthma(493.90)   . Chest pain, atypical   . Nontoxic multinodular goiter   . Painful respiration   . Dyspnea on exertion   . Trigeminal neuralgia   . Cluster headache   . Hyperlipidemia   . GERD (gastroesophageal reflux disease)   . Complication of anesthesia     difficult waking up"  . Food poisoning 11/2011  . Multiple thyroid nodules   . Hiatal hernia     Past Surgical History  Procedure Laterality Date  . Appendectomy    . Knee arthroscopy    . 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 Right shoulder redo-reconstruction  06/21/10  . Destruction trigeminal nerve via neurolytic agent      History   Social History  . Marital Status: Married    Spouse Name: N/A  . Number of Children: N/A  . 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 Smoking status: Current Every Day Smoker -- 0.25 packs/day for 18 years    Types: Cigarettes  . Smokeless tobacco: Never Used     Comment: 07/10/13 1/2 ppd  . Alcohol Use: No     Comment: rare  . Drug Use: No  . Sexual Activity: Not Currently   Other Topics Concern  . Not on file   Social History Narrative   HSG   Culinary school in [place]   Married - '84 - 5 years divorced; remarried '96   1 son - '85      Regular Exercise -  NO              Medication List       This list is accurate as of: 11/24/14  9:23 AM.  Always use your most recent med list.               acetaminophen 325 MG tablet  Commonly known as:  TYLENOL  Take 2 tablets (650 mg total) by mouth every 4 (four) hours as needed for headache or mild pain.     aspirin 81 MG EC tablet  Take 81 mg by mouth daily.     diltiazem 360 MG 24 hr capsule  Commonly known as:  TIAZAC  TAKE 1 CAPSULE (360 MG TOTAL) BY  MOUTH DAILY.     fluticasone 50 MCG/ACT nasal spray  Commonly known as:  FLONASE  Place 2 sprays into both nostrils as needed for allergies or rhinitis.     lansoprazole 30 MG capsule  Commonly known as:  PREVACID  TAKE 1 CAPSULE (30 MG TOTAL) BY MOUTH DAILY.     lansoprazole 15 MG capsule  Commonly known as:  PREVACID  TAKE 1 CAPSULE (15 MG TOTAL) BY MOUTH DAILY AT 12 NOON.     levothyroxine 25 MCG tablet  Commonly known as:  SYNTHROID, LEVOTHROID  TAKE 1 TABLET (25 MCG TOTAL) BY MOUTH DAILY.     MOTRIN PO  Take 4 tablets by mouth daily as needed.     PARoxetine 20 MG tablet  Commonly known as:  PAXIL  Please take 1/2 tablet daily for 1 week. Then take 1 tablet daily.     pravastatin 20 MG tablet  Commonly known as:  PRAVACHOL  TAKE 1 TABLET (20 MG TOTAL) BY MOUTH DAILY. NEED APPOINTMENT FOR MORE REFILLS     ranitidine 150 MG capsule  Commonly known as:  ZANTAC  Take 1 capsule (150 mg total) by mouth 2 (two) times daily.      tamsulosin 0.4 MG Caps capsule  Commonly known as:  FLOMAX  TAKE 1 CAPSULE (0.4 MG TOTAL) BY MOUTH DAILY AFTER SUPPER.           Objective:   Physical Exam BP 132/84 mmHg  Pulse 77  Temp(Src) 98.2 F (36.8 C) (Oral)  Ht 5\' 11"  (1.803 m)  Wt 227 lb 6 oz (103.137 kg)  BMI 31.73 kg/m2  SpO2 95% General:   Well developed, well nourished . NAD.  HEENT:  Normocephalic . Face symmetric, atraumatic Lungs:  CTA B Normal respiratory effort, no intercostal retractions, no accessory muscle use. Heart: RRR,  no murmur.  no pretibial edema bilaterally  Abdomen:  Not distended, soft, non-tender. No rebound or rigidity. No mass,organomegaly Skin: Not pale. Not jaundice Neurologic:  alert & oriented X3.  Speech normal, gait appropriate for age and unassisted Psych--  Cognition and judgment appear intact.  Cooperative with normal attention span and concentration.  Behavior appropriate. Moderately anxious but not depressed appearing.    Assessment & Plan:   Today, I spent more than  45 minutes with the patient: >50% of the time counseling regards  treatment options for anxiety, why alprazolam is not appropriate, doing extensive chart review

## 2014-11-24 NOTE — Telephone Encounter (Signed)
Okay to put 2-15 minute appts/same day appts together.

## 2014-11-24 NOTE — Telephone Encounter (Signed)
As per 11/24/14 AVS: Return in about 4 weeks (around 12/22/2014) for routine visit , 30 min, no fasting. May i use 2 same day slots due to PCP not having any availability

## 2014-11-24 NOTE — Progress Notes (Signed)
Pre visit review using our clinic review tool, if applicable. No additional management support is needed unless otherwise documented below in the visit note. 

## 2014-11-24 NOTE — Telephone Encounter (Signed)
lvm advising patient to call office to schedule appointment

## 2014-11-25 ENCOUNTER — Encounter (HOSPITAL_COMMUNITY)
Admission: RE | Disposition: A | Discharge status: Home / Self Care | Payer: BLUE CROSS/BLUE SHIELD | Source: Ambulatory Visit | Attending: Interventional Cardiology

## 2014-11-25 ENCOUNTER — Encounter (HOSPITAL_COMMUNITY): Payer: Self-pay | Admitting: Interventional Cardiology

## 2014-11-25 ENCOUNTER — Ambulatory Visit (HOSPITAL_COMMUNITY)
Admission: RE | Admit: 2014-11-25 | Discharge: 2014-11-25 | Disposition: A | Discharge status: Home / Self Care | Payer: BLUE CROSS/BLUE SHIELD | Source: Ambulatory Visit | Attending: Interventional Cardiology | Admitting: Interventional Cardiology

## 2014-11-25 DIAGNOSIS — R0789 Other chest pain: Secondary | ICD-10-CM | POA: Diagnosis not present

## 2014-11-25 DIAGNOSIS — I10 Essential (primary) hypertension: Secondary | ICD-10-CM | POA: Insufficient documentation

## 2014-11-25 DIAGNOSIS — F17210 Nicotine dependence, cigarettes, uncomplicated: Secondary | ICD-10-CM | POA: Diagnosis not present

## 2014-11-25 DIAGNOSIS — I2510 Atherosclerotic heart disease of native coronary artery without angina pectoris: Secondary | ICD-10-CM | POA: Diagnosis present

## 2014-11-25 DIAGNOSIS — Z720 Tobacco use: Secondary | ICD-10-CM | POA: Diagnosis present

## 2014-11-25 DIAGNOSIS — Z7982 Long term (current) use of aspirin: Secondary | ICD-10-CM | POA: Diagnosis not present

## 2014-11-25 DIAGNOSIS — E785 Hyperlipidemia, unspecified: Secondary | ICD-10-CM | POA: Diagnosis not present

## 2014-11-25 HISTORY — PX: CARDIAC CATHETERIZATION: SHX172

## 2014-11-25 LAB — BASIC METABOLIC PANEL
Anion gap: 16 — ABNORMAL HIGH (ref 5–15)
BUN: 12 mg/dL (ref 6–20)
CO2: 20 mmol/L — ABNORMAL LOW (ref 22–32)
Calcium: 10.1 mg/dL (ref 8.9–10.3)
Chloride: 106 mmol/L (ref 101–111)
Creatinine, Ser: 1.46 mg/dL — ABNORMAL HIGH (ref 0.61–1.24)
GFR calc Af Amer: >60 mL/min (ref >60)
GFR calc non Af Amer: 54 mL/min — ABNORMAL LOW (ref >60)
Glucose, Bld: 173 mg/dL — ABNORMAL HIGH (ref 65–99)
Potassium: 3.8 mmol/L (ref 3.5–5.1)
Sodium: 142 mmol/L (ref 135–145)

## 2014-11-25 LAB — CBC
HCT: 49.6 % (ref 39.0–52.0)
Hemoglobin: 17.4 g/dL — ABNORMAL HIGH (ref 13.0–17.0)
MCH: 31.2 pg (ref 26.0–34.0)
MCHC: 35.1 g/dL (ref 30.0–36.0)
MCV: 89 fL (ref 78.0–100.0)
Platelets: 173 10*3/uL (ref 150–400)
RBC: 5.57 MIL/uL (ref 4.22–5.81)
RDW: 13.3 % (ref 11.5–15.5)
WBC: 13.1 10*3/uL — ABNORMAL HIGH (ref 4.0–10.5)

## 2014-11-25 LAB — PROTIME-INR
INR: 1.21 (ref 0.00–1.49)
Prothrombin Time: 15.4 seconds — ABNORMAL HIGH (ref 11.6–15.2)

## 2014-11-25 SURGERY — LEFT HEART CATH AND CORONARY ANGIOGRAPHY

## 2014-11-25 MED ORDER — MIDAZOLAM HCL 2 MG/2ML IJ SOLN
INTRAMUSCULAR | Status: AC
  Filled 2014-11-25: qty 4

## 2014-11-25 MED ORDER — MIDAZOLAM HCL 2 MG/2ML IJ SOLN
INTRAMUSCULAR | Status: DC | PRN
  Administered 2014-11-25 (×3): 1 mg via INTRAVENOUS

## 2014-11-25 MED ORDER — OXYCODONE-ACETAMINOPHEN 5-325 MG PO TABS: 1.0000 | ORAL_TABLET | ORAL | Status: DC | PRN

## 2014-11-25 MED ORDER — FENTANYL CITRATE (PF) 100 MCG/2ML IJ SOLN
INTRAMUSCULAR | Status: AC
  Filled 2014-11-25: qty 4

## 2014-11-25 MED ORDER — SODIUM CHLORIDE 0.9 % IV SOLN: INTRAVENOUS | Status: DC

## 2014-11-25 MED ORDER — SODIUM CHLORIDE 0.9 % IV SOLN
INTRAVENOUS | Status: DC
Start: 2014-11-25 — End: 2014-11-25

## 2014-11-25 MED ORDER — SODIUM CHLORIDE 0.9 % WEIGHT BASED INFUSION
3.0000 mL/kg/h | INTRAVENOUS | Status: DC
  Administered 2014-11-25: 3 mL/kg/h via INTRAVENOUS

## 2014-11-25 MED ORDER — SODIUM CHLORIDE 0.9 % IJ SOLN: 3.0000 mL | Freq: Two times a day (BID) | INTRAMUSCULAR | Status: DC

## 2014-11-25 MED ORDER — VERAPAMIL HCL 2.5 MG/ML IV SOLN
INTRAVENOUS | Status: AC
  Filled 2014-11-25: qty 2

## 2014-11-25 MED ORDER — ASPIRIN 81 MG PO CHEW
81.0000 mg | CHEWABLE_TABLET | ORAL | Status: AC
  Administered 2014-11-25: 81 mg via ORAL

## 2014-11-25 MED ORDER — ONDANSETRON HCL 4 MG/2ML IJ SOLN
INTRAMUSCULAR | Status: AC
  Filled 2014-11-25: qty 2

## 2014-11-25 MED ORDER — ONDANSETRON HCL 4 MG/2ML IJ SOLN
4.0000 mg | Freq: Four times a day (QID) | INTRAMUSCULAR | Status: DC | PRN
  Administered 2014-11-25: 4 mg via INTRAVENOUS

## 2014-11-25 MED ORDER — HEPARIN SODIUM (PORCINE) 1000 UNIT/ML IJ SOLN
INTRAMUSCULAR | Status: AC
  Filled 2014-11-25: qty 1

## 2014-11-25 MED ORDER — SODIUM CHLORIDE 0.9 % WEIGHT BASED INFUSION: 3.0000 mL/kg/h | INTRAVENOUS | Status: DC

## 2014-11-25 MED ORDER — SODIUM CHLORIDE 0.9 % IV SOLN: 250.0000 mL | INTRAVENOUS | Status: DC | PRN

## 2014-11-25 MED ORDER — ASPIRIN 81 MG PO CHEW
CHEWABLE_TABLET | ORAL | Status: AC
  Administered 2014-11-25: 81 mg via ORAL
  Filled 2014-11-25: qty 1

## 2014-11-25 MED ORDER — FENTANYL CITRATE (PF) 100 MCG/2ML IJ SOLN
INTRAMUSCULAR | Status: DC | PRN
  Administered 2014-11-25 (×2): 50 ug via INTRAVENOUS

## 2014-11-25 MED ORDER — HEPARIN (PORCINE) IN NACL 2-0.9 UNIT/ML-% IJ SOLN
INTRAMUSCULAR | Status: AC
  Filled 2014-11-25: qty 1500

## 2014-11-25 MED ORDER — ACETAMINOPHEN 325 MG PO TABS: 650.0000 mg | ORAL_TABLET | ORAL | Status: DC | PRN

## 2014-11-25 MED ORDER — SODIUM CHLORIDE 0.9 % IJ SOLN: 3.0000 mL | INTRAMUSCULAR | Status: DC | PRN

## 2014-11-25 MED ORDER — IOHEXOL 350 MG/ML SOLN
INTRAVENOUS | Status: DC | PRN
  Administered 2014-11-25: 80 mL via INTRAVENOUS

## 2014-11-25 MED ORDER — LIDOCAINE HCL (PF) 1 % IJ SOLN
INTRAMUSCULAR | Status: AC
  Filled 2014-11-25: qty 30

## 2014-11-25 MED ORDER — NITROGLYCERIN 1 MG/10 ML FOR IR/CATH LAB
INTRA_ARTERIAL | Status: AC
  Filled 2014-11-25: qty 10

## 2014-11-25 MED ORDER — SODIUM CHLORIDE 0.9 % WEIGHT BASED INFUSION
1.0000 mL/kg/h | INTRAVENOUS | Status: DC
  Administered 2014-11-25: 1 mL/kg/h via INTRAVENOUS

## 2014-11-25 SURGICAL SUPPLY — 9 items
CATH INFINITI 5 FR JL3.5 (CATHETERS) ×2 IMPLANT
CATH INFINITI JR4 5F (CATHETERS) ×2 IMPLANT
DEVICE RAD COMP TR BAND LRG (VASCULAR PRODUCTS) ×2 IMPLANT
GLIDESHEATH SLEND A-KIT 6F 22G (SHEATH) ×2 IMPLANT
KIT HEART LEFT (KITS) ×2 IMPLANT
PACK CARDIAC CATHETERIZATION (CUSTOM PROCEDURE TRAY) ×2 IMPLANT
TRANSDUCER W/STOPCOCK (MISCELLANEOUS) ×2 IMPLANT
TUBING CIL FLEX 10 FLL-RA (TUBING) ×2 IMPLANT
WIRE SAFE-T 1.5MM-J .035X260CM (WIRE) ×2 IMPLANT

## 2014-11-25 NOTE — Discharge Instructions (Signed)
Radial Site Care °Refer to this sheet in the next few weeks. These instructions provide you with information on caring for yourself after your procedure. Your caregiver may also give you more specific instructions. Your treatment has been planned according to current medical practices, but problems sometimes occur. Call your caregiver if you have any problems or questions after your procedure. °HOME CARE INSTRUCTIONS °· You may shower the day after the procedure. Remove the bandage (dressing) and gently wash the site with plain soap and water. Gently pat the site dry. °· Do not apply powder or lotion to the site. °· Do not submerge the affected site in water for 3 to 5 days. °· Inspect the site at least twice daily. °· Do not flex or bend the affected arm for 24 hours. °· No lifting over 5 pounds (2.3 kg) for 5 days after your procedure. °· Do not drive home if you are discharged the same day of the procedure. Have someone else drive you. °· You may drive 24 hours after the procedure unless otherwise instructed by your caregiver. °· Do not operate machinery or power tools for 24 hours. °· A responsible adult should be with you for the first 24 hours after you arrive home. °What to expect: °· Any bruising will usually fade within 1 to 2 weeks. °· Blood that collects in the tissue (hematoma) may be painful to the touch. It should usually decrease in size and tenderness within 1 to 2 weeks. °SEEK IMMEDIATE MEDICAL CARE IF: °· You have unusual pain at the radial site. °· You have redness, warmth, swelling, or pain at the radial site. °· You have drainage (other than a small amount of blood on the dressing). °· You have chills. °· You have a fever or persistent symptoms for more than 72 hours. °· You have a fever and your symptoms suddenly get worse. °· Your arm becomes pale, cool, tingly, or numb. °· You have heavy bleeding from the site. Hold pressure on the site. °Document Released: 05/13/2010 Document Revised:  07/03/2011 Document Reviewed: 05/13/2010 °ExitCare® Patient Information ©2015 [employer]. This information is not intended to replace advice given to you by your health care provider. Make sure you discuss any questions you have with your health care provider. ° °

## 2014-11-25 NOTE — H&P (View-Only) (Signed)
HPI [name] is referred today for evaluation of an abnormal stress test. The patient is a 54 yo man with a h/o dyslipidemia and HTN who was admitted with chest pressure and underwent stress testing which demonstrated LV dysfunction with normal coronary perfusion. A subsequent echo was obtained which was normal with regard to LV function. His pain has both typical and atypical components. He has multiple cardiac risk factors including hypertension and dyslipidemia No Known Allergies   Current Outpatient Prescriptions  Medication Sig Dispense Refill  . acetaminophen (TYLENOL) 325 MG tablet Take 2 tablets (650 mg total) by mouth every 4 (four) hours as needed for headache or mild pain.    Marland Kitchen aspirin 81 MG EC tablet Take 81 mg by mouth daily.      Marland Kitchen diltiazem (TIAZAC) 360 MG 24 hr capsule TAKE 1 CAPSULE (360 MG TOTAL) BY MOUTH DAILY. 30 capsule 3  . fluticasone (FLONASE) 50 MCG/ACT nasal spray Place 2 sprays into both nostrils as needed for allergies or rhinitis.    Marland Kitchen lansoprazole (PREVACID) 15 MG capsule Take 1 capsule by mouth as needed.  2  . lansoprazole (PREVACID) 30 MG capsule TAKE 1 CAPSULE (30 MG TOTAL) BY MOUTH DAILY. 30 capsule 0  . levothyroxine (SYNTHROID, LEVOTHROID) 25 MCG tablet TAKE 1 TABLET (25 MCG TOTAL) BY MOUTH DAILY. 30 tablet 3  . PARoxetine (PAXIL) 20 MG tablet Please take 1/2 tablet daily for 1 week. Then take 1 tablet daily. 30 tablet 3  . pravastatin (PRAVACHOL) 20 MG tablet TAKE 1 TABLET (20 MG TOTAL) BY MOUTH DAILY. NEED APPOINTMENT FOR MORE REFILLS 30 tablet 3  . ranitidine (ZANTAC) 150 MG capsule Take 1 capsule (150 mg total) by mouth 2 (two) times daily. 60 capsule 3  . tamsulosin (FLOMAX) 0.4 MG CAPS capsule TAKE 1 CAPSULE (0.4 MG TOTAL) BY MOUTH DAILY AFTER SUPPER. 30 capsule 0   No current facility-administered medications for this visit.     Past Medical History  Diagnosis Date  . Hypertension   . Unspecified intestinal obstruction   .  Unspecified asthma(493.90)   . Chest pain, atypical   . Nontoxic multinodular goiter   . Painful respiration   . Dyspnea on exertion   . Trigeminal neuralgia   . Cluster headache   . Hyperlipidemia   . GERD (gastroesophageal reflux disease)   . Complication of anesthesia     difficult waking up"  . Food poisoning 11/2011  . Multiple thyroid nodules   . Hiatal hernia     ROS:   All systems reviewed and negative except as noted in the HPI.   Past Surgical History  Procedure Laterality Date  . Appendectomy    . Knee arthroscopy    . 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 Right shoulder redo-reconstruction  06/21/10  . Destruction trigeminal nerve via neurolytic agent       Family History  Problem Relation Age of Onset  . Diabetes Mother   . Hyperlipidemia Mother   . Hypertension Mother   . Hypertension Father   . Heart disease Father     CAD/CHF  . Cancer Father     Mesothelioma  . Heart disease Maternal Aunt   . Prostate cancer Neg Hx   . Colon cancer Neg Hx      History   Social History  . Marital Status: Married    Spouse Name: N/A  . Number of Children: N/A  .  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 Smoking status: Current Every Day Smoker -- 0.25 packs/day for 18 years    Types: Cigarettes  . Smokeless tobacco: Never Used     Comment: 07/10/13 1/2 ppd  . Alcohol Use: No     Comment: rare  . Drug Use: No  . Sexual Activity: Not Currently   Other Topics Concern  . Not on file   Social History Narrative   HSG   Culinary school in [place]   Married - '84 - 5 years divorced; remarried '96   1 son - '85      Regular Exercise -  NO           BP 140/78 mmHg  Pulse 79  Ht 5' 10.5" (1.791 m)  Wt 234 lb 3.2 oz (106.232 kg)  BMI 33.12 kg/m2  SpO2 94%  Physical Exam:  Well appearing middle aged man, NAD HEENT: Unremarkable Neck: 6  cm JVD, no thyromegally Back:  No CVA tenderness Lungs:  Clear with no wheezes HEART:  Regular rate rhythm, no murmurs, no rubs, no clicks Abd:  soft, positive bowel sounds, no organomegally, no rebound, no guarding Ext:  2 plus pulses, no edema, no cyanosis, no clubbing Skin:  No rashes no nodules Neuro:  CN II through XII intact, motor grossly intact  EKG - reviewed NSR, no acute STT changes.  Assess/Plan:

## 2014-11-25 NOTE — Interval H&P Note (Signed)
Cath Lab Visit (complete for each Cath Lab visit)  Clinical Evaluation Leading to the Procedure:   ACS: No.  Non-ACS:    Anginal Classification: CCS II  Anti-ischemic medical therapy: Minimal Therapy (1 class of medications)  Non-Invasive Test Results: Intermediate-risk stress test findings: cardiac mortality 1-3%/year  Prior CABG: No previous CABG      History and Physical Interval Note:  11/25/2014 7:36 AM  [name]  has presented today for surgery, with the diagnosis of cp/abnormal stress  The various methods of treatment have been discussed with the patient and family. After consideration of risks, benefits and other options for treatment, the patient has consented to  Procedure(s): Left Heart Cath and Coronary Angiography (N/A) as a surgical intervention .  The patient's history has been reviewed, patient examined, no change in status, stable for surgery.  I have reviewed the patient's chart and labs.  Questions were answered to the patient's satisfaction.     [name]

## 2014-11-26 MED FILL — Nitroglycerin IV Soln 100 MCG/ML in D5W: INTRA_ARTERIAL | Qty: 10 | Status: AC

## 2014-11-26 MED FILL — Heparin Sodium (Porcine) 2 Unit/ML in Sodium Chloride 0.9%: INTRAMUSCULAR | Qty: 1500 | Status: AC

## 2014-11-26 MED FILL — Lidocaine HCl Local Preservative Free (PF) Inj 1%: INTRAMUSCULAR | Qty: 30 | Status: AC

## 2014-11-26 NOTE — Assessment & Plan Note (Signed)
  Found to have a renal mass incidentally on CT chest 10/19/2014, see report below. Plan: Schedule a renal ultrasound.  16 mm exophytic abnormality arising from upper pole of left kidney which may represent hyperdense cyst or mass. Renal ultrasound is recommended for further evaluation.

## 2014-11-26 NOTE — Assessment & Plan Note (Signed)
History of anxiety,  recommend counseling which has never done before. Gave pt a list of providers  He is intolerant to Zoloft and paxil "made me feel like I don't care". Will reassess his situations after the cardiac catheterization tomorrow, consider something in the line of Cymbalta or Effexor. Avoid  Xanax or similar medications d/t  question of narcotic abuse.

## 2014-11-26 NOTE — Assessment & Plan Note (Signed)
Non- obstructive CAD per cardiac catheterization in 2010 Currently having chest pain, CT chest 10/19/2014 no PE.:  Had down Myoview 6 2016: EF decreased to around 44, no T-wave inversions, study was felt to be intermediate risk She is having again chest pain and is to schedule  to have a cardiac catheterization tomorrow, recommend ER if in the meantime he has persistent or severe chest pain  Addendum: Cardiac catheterization 11/25/2014: 1. Prox Cx to Mid Cx lesion, 40% stenosed. 2. Mid RCA to Dist RCA lesion, 50% stenosed.   Nonobstructive mid and distal RCA plaquing less than 50% and less than 50% plaque in the proximal to mid circumflex. These vessels are widely patent as is the left anterior descending.  Overall normal LV systolic function with an ejection fraction of 50-55%.  Chest discomfort is not related to obstructive coronary disease.  Aggressive risk factor modification that should include smoking cessation.

## 2014-11-26 NOTE — Assessment & Plan Note (Signed)
Diagnosis at age 54 aprox , at some point was seen at the [facility] : [facility]. Also has a history of trigeminal neuralgia, status post trigeminal nerve destruction 3 in an effort to decrease his headaches. At this point he has headaches on and off, uses oxygen and Motrin as needed.  headaches are better than few years ago.

## 2014-11-26 NOTE — Assessment & Plan Note (Signed)
Hypertension: Under excellent control, decided  to skip BP medications for a couple of days to see how he is doing. Recommend to take medication daily and monitor BPs

## 2014-11-26 NOTE — Assessment & Plan Note (Addendum)
History of substance abuse, Abnormal UDS before, patient reports he simply took narcotics from a friend but he does not abuse it.

## 2014-11-27 ENCOUNTER — Ambulatory Visit (HOSPITAL_BASED_OUTPATIENT_CLINIC_OR_DEPARTMENT_OTHER)
Admission: RE | Admit: 2014-11-27 | Discharge: 2014-11-27 | Disposition: A | Discharge status: Home / Self Care | Payer: BLUE CROSS/BLUE SHIELD | Source: Ambulatory Visit | Attending: Internal Medicine | Admitting: Internal Medicine

## 2014-11-27 DIAGNOSIS — N2889 Other specified disorders of kidney and ureter: Secondary | ICD-10-CM

## 2014-11-27 DIAGNOSIS — N281 Cyst of kidney, acquired: Secondary | ICD-10-CM | POA: Diagnosis not present

## 2014-12-22 ENCOUNTER — Ambulatory Visit: Payer: BLUE CROSS/BLUE SHIELD | Admitting: Internal Medicine

## 2014-12-22 ENCOUNTER — Telehealth: Payer: Self-pay | Admitting: Internal Medicine

## 2014-12-22 DIAGNOSIS — Z0289 Encounter for other administrative examinations: Secondary | ICD-10-CM

## 2014-12-24 NOTE — Telephone Encounter (Signed)
Pt was no show 12/22/14 1:00pm for 4 week f/u appt, pt has not rescheduled, lm for him to call and reschedule, charge for no show?

## 2014-12-25 NOTE — Telephone Encounter (Signed)
Yes, thx

## 2015-02-03 ENCOUNTER — Other Ambulatory Visit: Payer: Self-pay | Admitting: Physician Assistant

## 2015-05-31 ENCOUNTER — Telehealth: Payer: Self-pay | Admitting: Internal Medicine

## 2015-05-31 NOTE — Telephone Encounter (Signed)
LM to schedule f/u appt & update flu record

## 2015-09-22 ENCOUNTER — Observation Stay (HOSPITAL_COMMUNITY): Payer: BLUE CROSS/BLUE SHIELD

## 2015-09-22 ENCOUNTER — Encounter (HOSPITAL_BASED_OUTPATIENT_CLINIC_OR_DEPARTMENT_OTHER): Payer: Self-pay

## 2015-09-22 ENCOUNTER — Observation Stay (HOSPITAL_BASED_OUTPATIENT_CLINIC_OR_DEPARTMENT_OTHER)
Admission: EM | Admit: 2015-09-22 | Discharge: 2015-09-24 | Disposition: A | Discharge status: Home / Self Care | Payer: BLUE CROSS/BLUE SHIELD | Attending: Internal Medicine | Admitting: Internal Medicine

## 2015-09-22 DIAGNOSIS — J45909 Unspecified asthma, uncomplicated: Secondary | ICD-10-CM | POA: Insufficient documentation

## 2015-09-22 DIAGNOSIS — R197 Diarrhea, unspecified: Secondary | ICD-10-CM

## 2015-09-22 DIAGNOSIS — R351 Nocturia: Secondary | ICD-10-CM | POA: Diagnosis not present

## 2015-09-22 DIAGNOSIS — I10 Essential (primary) hypertension: Secondary | ICD-10-CM | POA: Insufficient documentation

## 2015-09-22 DIAGNOSIS — N401 Enlarged prostate with lower urinary tract symptoms: Secondary | ICD-10-CM | POA: Diagnosis not present

## 2015-09-22 DIAGNOSIS — Z8673 Personal history of transient ischemic attack (TIA), and cerebral infarction without residual deficits: Secondary | ICD-10-CM | POA: Diagnosis not present

## 2015-09-22 DIAGNOSIS — R112 Nausea with vomiting, unspecified: Secondary | ICD-10-CM

## 2015-09-22 DIAGNOSIS — Z720 Tobacco use: Secondary | ICD-10-CM | POA: Diagnosis present

## 2015-09-22 DIAGNOSIS — N3090 Cystitis, unspecified without hematuria: Secondary | ICD-10-CM | POA: Insufficient documentation

## 2015-09-22 DIAGNOSIS — N281 Cyst of kidney, acquired: Secondary | ICD-10-CM | POA: Diagnosis not present

## 2015-09-22 DIAGNOSIS — K449 Diaphragmatic hernia without obstruction or gangrene: Secondary | ICD-10-CM | POA: Insufficient documentation

## 2015-09-22 DIAGNOSIS — E039 Hypothyroidism, unspecified: Secondary | ICD-10-CM | POA: Insufficient documentation

## 2015-09-22 DIAGNOSIS — K219 Gastro-esophageal reflux disease without esophagitis: Secondary | ICD-10-CM | POA: Insufficient documentation

## 2015-09-22 DIAGNOSIS — A044 Other intestinal Escherichia coli infections: Secondary | ICD-10-CM | POA: Diagnosis not present

## 2015-09-22 DIAGNOSIS — I2510 Atherosclerotic heart disease of native coronary artery without angina pectoris: Secondary | ICD-10-CM | POA: Diagnosis not present

## 2015-09-22 DIAGNOSIS — Z9049 Acquired absence of other specified parts of digestive tract: Secondary | ICD-10-CM | POA: Diagnosis not present

## 2015-09-22 DIAGNOSIS — E7800 Pure hypercholesterolemia, unspecified: Secondary | ICD-10-CM | POA: Diagnosis present

## 2015-09-22 DIAGNOSIS — E785 Hyperlipidemia, unspecified: Secondary | ICD-10-CM | POA: Insufficient documentation

## 2015-09-22 DIAGNOSIS — R1084 Generalized abdominal pain: Secondary | ICD-10-CM | POA: Diagnosis not present

## 2015-09-22 DIAGNOSIS — R109 Unspecified abdominal pain: Secondary | ICD-10-CM | POA: Diagnosis present

## 2015-09-22 DIAGNOSIS — G459 Transient cerebral ischemic attack, unspecified: Secondary | ICD-10-CM | POA: Diagnosis present

## 2015-09-22 HISTORY — DX: Hypothyroidism, unspecified: E03.9

## 2015-09-22 HISTORY — DX: Transient cerebral ischemic attack, unspecified: G45.9

## 2015-09-22 HISTORY — DX: Benign prostatic hyperplasia without lower urinary tract symptoms: N40.0

## 2015-09-22 HISTORY — DX: Other psychoactive substance abuse, uncomplicated: F19.10

## 2015-09-22 LAB — BASIC METABOLIC PANEL
Anion gap: 14 (ref 5–15)
BUN: 19 mg/dL (ref 6–20)
CO2: 22 mmol/L (ref 22–32)
Calcium: 9.1 mg/dL (ref 8.9–10.3)
Chloride: 104 mmol/L (ref 101–111)
Creatinine, Ser: 1.02 mg/dL (ref 0.61–1.24)
GFR calc Af Amer: >60 mL/min (ref >60)
GFR calc non Af Amer: >60 mL/min (ref >60)
Glucose, Bld: 148 mg/dL — ABNORMAL HIGH (ref 65–99)
Potassium: 3.5 mmol/L (ref 3.5–5.1)
Sodium: 140 mmol/L (ref 135–145)

## 2015-09-22 LAB — CBC WITH DIFFERENTIAL/PLATELET
Basophils Absolute: 0 10*3/uL (ref 0.0–0.1)
Basophils Relative: 0 %
Eosinophils Absolute: 0 10*3/uL (ref 0.0–0.7)
Eosinophils Relative: 0 %
HCT: 49.5 % (ref 39.0–52.0)
Hemoglobin: 17.6 g/dL — ABNORMAL HIGH (ref 13.0–17.0)
Lymphocytes Relative: 15 %
Lymphs Abs: 1.9 10*3/uL (ref 0.7–4.0)
MCH: 30.6 pg (ref 26.0–34.0)
MCHC: 35.6 g/dL (ref 30.0–36.0)
MCV: 85.9 fL (ref 78.0–100.0)
Monocytes Absolute: 0.4 10*3/uL (ref 0.1–1.0)
Monocytes Relative: 3 %
Neutro Abs: 10.5 10*3/uL — ABNORMAL HIGH (ref 1.7–7.7)
Neutrophils Relative %: 82 %
Platelets: 193 10*3/uL (ref 150–400)
RBC: 5.76 MIL/uL (ref 4.22–5.81)
RDW: 13.4 % (ref 11.5–15.5)
WBC: 12.8 10*3/uL — ABNORMAL HIGH (ref 4.0–10.5)

## 2015-09-22 LAB — HEPATIC FUNCTION PANEL
ALT: 15 U/L — ABNORMAL LOW (ref 17–63)
AST: 18 U/L (ref 15–41)
Albumin: 4.2 g/dL (ref 3.5–5.0)
Alkaline Phosphatase: 50 U/L (ref 38–126)
Bilirubin, Direct: 0.1 mg/dL (ref 0.1–0.5)
Indirect Bilirubin: 0.8 mg/dL (ref 0.3–0.9)
Total Bilirubin: 0.9 mg/dL (ref 0.3–1.2)
Total Protein: 7.6 g/dL (ref 6.5–8.1)

## 2015-09-22 LAB — PROTIME-INR
INR: 1.25 (ref 0.00–1.49)
Prothrombin Time: 15.8 seconds — ABNORMAL HIGH (ref 11.6–15.2)

## 2015-09-22 LAB — PROCALCITONIN: Procalcitonin: <0.1 ng/mL

## 2015-09-22 LAB — LACTIC ACID, PLASMA: Lactic Acid, Venous: 1.7 mmol/L (ref 0.5–2.0)

## 2015-09-22 LAB — APTT: aPTT: 29 seconds (ref 24–37)

## 2015-09-22 LAB — LIPASE, BLOOD: Lipase: 16 U/L (ref 11–51)

## 2015-09-22 MED ORDER — ONDANSETRON HCL 4 MG/2ML IJ SOLN
4.0000 mg | Freq: Three times a day (TID) | INTRAMUSCULAR | Status: AC | PRN
  Administered 2015-09-22: 4 mg via INTRAVENOUS
  Filled 2015-09-22: qty 2

## 2015-09-22 MED ORDER — METOCLOPRAMIDE HCL 5 MG/ML IJ SOLN
10.0000 mg | INTRAMUSCULAR | Status: AC
  Administered 2015-09-22: 10 mg via INTRAVENOUS
  Filled 2015-09-22: qty 2

## 2015-09-22 MED ORDER — LORAZEPAM 2 MG/ML IJ SOLN
1.0000 mg | Freq: Once | INTRAMUSCULAR | Status: AC
  Administered 2015-09-22: 1 mg via INTRAVENOUS
  Filled 2015-09-22: qty 1

## 2015-09-22 MED ORDER — MORPHINE SULFATE (PF) 4 MG/ML IV SOLN
4.0000 mg | Freq: Once | INTRAVENOUS | Status: AC
  Administered 2015-09-22: 4 mg via INTRAVENOUS
  Filled 2015-09-22: qty 1

## 2015-09-22 MED ORDER — SODIUM CHLORIDE 0.9 % IV SOLN
Freq: Once | INTRAVENOUS | Status: AC
  Administered 2015-09-22: 22:00:00 via INTRAVENOUS

## 2015-09-22 MED ORDER — SODIUM CHLORIDE 0.9 % IV BOLUS (SEPSIS)
1000.0000 mL | Freq: Once | INTRAVENOUS | Status: AC
  Administered 2015-09-22: 1000 mL via INTRAVENOUS

## 2015-09-22 MED ORDER — METOCLOPRAMIDE HCL 5 MG/ML IJ SOLN
10.0000 mg | Freq: Four times a day (QID) | INTRAMUSCULAR | Status: DC
  Administered 2015-09-23 – 2015-09-24 (×6): 10 mg via INTRAVENOUS
  Filled 2015-09-22 (×6): qty 2

## 2015-09-22 MED ORDER — NICOTINE 21 MG/24HR TD PT24
21.0000 mg | MEDICATED_PATCH | Freq: Every day | TRANSDERMAL | Status: DC
  Filled 2015-09-22: qty 1

## 2015-09-22 MED ORDER — IOPAMIDOL (ISOVUE-300) INJECTION 61%
INTRAVENOUS | Status: AC
  Administered 2015-09-22: 100 mL
  Filled 2015-09-22: qty 100

## 2015-09-22 MED ORDER — FAMOTIDINE IN NACL 20-0.9 MG/50ML-% IV SOLN
20.0000 mg | Freq: Two times a day (BID) | INTRAVENOUS | Status: DC
  Administered 2015-09-22 – 2015-09-24 (×4): 20 mg via INTRAVENOUS
  Filled 2015-09-22 (×5): qty 50

## 2015-09-22 MED ORDER — ONDANSETRON HCL 4 MG/2ML IJ SOLN
4.0000 mg | Freq: Once | INTRAMUSCULAR | Status: AC
Start: 2015-09-22 — End: 2015-09-22
  Administered 2015-09-22: 4 mg via INTRAVENOUS
  Filled 2015-09-22: qty 2

## 2015-09-22 MED ORDER — AMLODIPINE BESYLATE 10 MG PO TABS
10.0000 mg | ORAL_TABLET | Freq: Every day | ORAL | Status: DC
  Administered 2015-09-23 – 2015-09-24 (×2): 10 mg via ORAL
  Filled 2015-09-22 (×3): qty 1

## 2015-09-22 MED ORDER — ACETAMINOPHEN 325 MG PO TABS: 650.0000 mg | ORAL_TABLET | Freq: Four times a day (QID) | ORAL | Status: DC | PRN

## 2015-09-22 MED ORDER — HYDRALAZINE HCL 20 MG/ML IJ SOLN
5.0000 mg | INTRAMUSCULAR | Status: DC | PRN
  Administered 2015-09-23: 5 mg via INTRAVENOUS
  Filled 2015-09-22: qty 1

## 2015-09-22 MED ORDER — ACETAMINOPHEN 650 MG RE SUPP: 650.0000 mg | Freq: Four times a day (QID) | RECTAL | Status: DC | PRN

## 2015-09-22 MED ORDER — LORAZEPAM 2 MG/ML IJ SOLN
0.5000 mg | Freq: Four times a day (QID) | INTRAMUSCULAR | Status: DC | PRN
Start: 2015-09-22 — End: 2015-09-24
  Administered 2015-09-23: 0.5 mg via INTRAVENOUS
  Filled 2015-09-22: qty 1

## 2015-09-22 MED ORDER — MORPHINE SULFATE (PF) 4 MG/ML IV SOLN
2.0000 mg | INTRAVENOUS | Status: DC | PRN
  Administered 2015-09-22 – 2015-09-24 (×7): 2 mg via INTRAVENOUS
  Filled 2015-09-22 (×7): qty 1

## 2015-09-22 MED ORDER — SODIUM CHLORIDE 0.9 % IV SOLN
Freq: Once | INTRAVENOUS | Status: AC
  Administered 2015-09-22: 13:00:00 via INTRAVENOUS

## 2015-09-22 MED ORDER — ONDANSETRON HCL 4 MG/2ML IJ SOLN
4.0000 mg | Freq: Once | INTRAMUSCULAR | Status: AC
  Administered 2015-09-22: 4 mg via INTRAVENOUS
  Filled 2015-09-22: qty 2

## 2015-09-22 MED ORDER — MORPHINE SULFATE (PF) 4 MG/ML IV SOLN
6.0000 mg | Freq: Once | INTRAVENOUS | Status: AC
  Administered 2015-09-22: 6 mg via INTRAVENOUS
  Filled 2015-09-22: qty 2

## 2015-09-22 MED ORDER — ASPIRIN EC 81 MG PO TBEC
81.0000 mg | DELAYED_RELEASE_TABLET | Freq: Every day | ORAL | Status: DC
  Administered 2015-09-23 – 2015-09-24 (×2): 81 mg via ORAL
  Filled 2015-09-22 (×3): qty 1

## 2015-09-22 MED ORDER — TAMSULOSIN HCL 0.4 MG PO CAPS
0.4000 mg | ORAL_CAPSULE | Freq: Every day | ORAL | Status: DC
  Filled 2015-09-22: qty 1

## 2015-09-22 NOTE — ED Notes (Signed)
Presents today with N/V/D and having stomach cramps since this past Monday.

## 2015-09-22 NOTE — ED Notes (Signed)
Labs redrawn and too lab

## 2015-09-22 NOTE — ED Notes (Signed)
IVF rate changed to wide open per PA-C orders

## 2015-09-22 NOTE — ED Notes (Signed)
MD at bedside. 

## 2015-09-22 NOTE — ED Notes (Signed)
Placed on cont POX monitor with int NPB

## 2015-09-22 NOTE — ED Notes (Signed)
C/o n/v/d since Monday-presents to triage in w/c

## 2015-09-22 NOTE — ED Provider Notes (Signed)
CSN: [id number]     Arrival date & time 09/22/15  1139 History   First MD Initiated Contact with Patient 09/22/15 1154     Chief Complaint  Patient presents with  . Emesis     (Consider location/radiation/quality/duration/timing/severity/associated sxs/prior Treatment) Patient is a 55 y.o. male presenting with vomiting. The history is provided by the patient and medical records.  Emesis Associated symptoms: abdominal pain (cramping) and diarrhea     55 year old male with history of hypertension, cluster headaches, hyperlipidemia, GERD, hiatal hernia, presenting to the ED for nausea, vomiting, and diarrhea which began on Monday. He states he also has some generalized abdominal cramping. He denies any fever but does report some chills throughout the night last night.  Patient endorses similar episodes in the past but these were felt to be related to food poisoning. He denies any recent travel or changes in diet. No abnormal food intake. No recent antibiotics use. Prior abdominal surgeries include appendectomy.  Denies hx of SBO  Past Medical History  Diagnosis Date  . Hypertension   . Unspecified asthma(493.90)   . Chest pain, atypical   . Nontoxic multinodular goiter   . Dyspnea on exertion   . Trigeminal neuralgia   . Cluster headache   . Hyperlipidemia   . GERD (gastroesophageal reflux disease)   . Complication of anesthesia     difficult waking up"  . Hiatal hernia   . Pulmonary nodule --- CT 10/19/2014: Nodule is stable, no further routine x-rays 01/06/2009   Past Surgical History  Procedure Laterality Date  . Appendectomy    . Knee arthroscopy    . 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 Right shoulder redo-reconstruction  06/21/10  . Destruction trigeminal nerve via neurolytic agent  x 3 , R side last ~2010  . Cardiac catheterization N/A 11/25/2014    Procedure: Left Heart Cath and Coronary Angiography;  Surgeon: Belva Crome, MD;  Location:  [facility];  Service: Cardiovascular;  Laterality: N/A;   Family History  Problem Relation Age of Onset  . Diabetes Mother   . Hyperlipidemia Mother   . Hypertension Mother   . Hypertension Father   . Heart disease Father     CAD/CHF  . Cancer Father     Mesothelioma  . Heart disease Maternal Aunt   . Prostate cancer Neg Hx   . Colon cancer Neg Hx    Social History  Substance Use Topics  . Smoking status: Current Every Day Smoker -- 0.25 packs/day for 18 years    Types: Cigarettes  . Smokeless tobacco: Never Used     Comment: 07/10/13 1/2 ppd  . Alcohol Use: No    Review of Systems  Gastrointestinal: Positive for nausea, vomiting, abdominal pain (cramping) and diarrhea.  All other systems reviewed and are negative.     Allergies  Review of patient's allergies indicates no known allergies.  Home Medications   Prior to Admission medications   Not on File   BP 147/95 mmHg  Pulse 60  Temp(Src) 98.7 F (37.1 C) (Oral)  Resp 16  Ht 5\' 10"  (1.778 m)  Wt 81.647 kg  BMI 25.83 kg/m2  SpO2 100%   Physical Exam  Constitutional: He is oriented to person, place, and time. He appears well-developed and well-nourished. No distress.  Actively vomiting into emesis bag  HENT:  Head: Normocephalic and atraumatic.  Mouth/Throat: Oropharynx is clear and moist.  Mucous membranes dry  Eyes: Conjunctivae and EOM are normal. Pupils are equal, round, and reactive to light.  Neck: Normal range of motion. Neck supple.  Cardiovascular: Normal rate, regular rhythm and normal heart sounds.   Pulmonary/Chest: Effort normal and breath sounds normal. No respiratory distress. He has no wheezes.  Abdominal: Soft. Bowel sounds are normal. There is no tenderness. There is no guarding and no CVA tenderness.  Abdomen soft, distended, nontender, normal bowel sounds, passing flatus  Musculoskeletal: Normal range of motion.  Neurological: He is alert and oriented to person, place, and  time.  Skin: Skin is warm and dry. He is not diaphoretic.  Psychiatric: He has a normal mood and affect.  Nursing note and vitals reviewed.   ED Course  Procedures (including critical care time) Labs Review Labs Reviewed  CBC WITH DIFFERENTIAL/PLATELET - Abnormal; Notable for the following:    WBC 12.8 (*)    Hemoglobin 17.6 (*)    Neutro Abs 10.5 (*)    All other components within normal limits  BASIC METABOLIC PANEL - Abnormal; Notable for the following:    Glucose, Bld 148 (*)    All other components within normal limits  HEPATIC FUNCTION PANEL - Abnormal; Notable for the following:    ALT 15 (*)    All other components within normal limits  LIPASE, BLOOD    Imaging Review No results found. I have personally reviewed and evaluated these images and lab results as part of my medical decision-making.   EKG Interpretation None      MDM   Final diagnoses:  Nausea vomiting and diarrhea   55 year old male here with nausea, vomiting, and diarrhea for the past 3 days. He is afebrile, nontoxic. He is actively vomiting upon initial assessment. His abdomen is soft, benign, and nontender. He has no apparent distention, normal bowel sounds. No history of bowel obstruction in the past. Will obtain labs. IV fluids started, Zofran given.  2:22 PM Patient re-evaluated.  He has just vomited again after zofran and reglan.  States he was feeling better for a short while but vomiting started back.  Emesis is bright yellow, appears to be mostly bile.  Will repeat zofran, IVF continue infusing.  After second dose of zofran patient continues vomiting.  Patient reports he responds well to phenergan, however given national shortage not available at this facility.  Will try ativan, given third liter of fluid.  Abdomen remains soft and benign, continues passing flatus. Continue to have low suspicion for acute obstruction. Discussed with patient my concern due to lack of improvement here in the  emergency department after several rounds of medication and IVF. Patient has required admission for similar in the past and I feel this is reasonable at this time.  Patient willing to stay for continued treatment.  Patient admitted to hospitalist service, Dr. Eliseo Squires-- med/surg observation.  Temp admit orders placed.  VSS.  EMTALA completed.  Larene Pickett, PA-C 09/22/15 [place], MD 09/30/15 [phone]

## 2015-09-22 NOTE — Progress Notes (Signed)
Observation from [facility] for intractable N/V/D.  Had similar episode in 2013--- never found out what caused that episode. Seen by GI  Eulogio Bear DO

## 2015-09-22 NOTE — H&P (Addendum)
History and Physical    [name] [id number] DOB: 03/17/1961 DOA: 09/22/2015  Referring MD/NP/PA:   PCP: Jeffrey November, MD   Patient coming from:  The patient is coming from home.  At baseline, pt is independent for most of ADL.    Chief Complaint: Nausea, vomiting, diarrhea, abdominal pain  HPI: [name] is a 55 y.o. male with medical history significant of medication noncompliance, hypertension, hyperlipidemia, asthma, GERD, hypothyroidism, tobacco abuse, trigeminal neuralgia, cluster headache, hiatal hernia, pulmonary nodule, TIA, BPH, drug abuse, who presents with nausea, vomiting, diarrhea and abdominal pain.  Patient reports that he has been having nausea, vomiting, diarrhea and abdominal pain in the past 3 days, patient has been gradually getting worse. He vomited more than 10 times without blood in the vomitus. He has had more than 10 bowel movements with loose stool today. He has diffuse abdominal pain, constant, 7 out of 10 in severity, nonradiating. It is not aggravated or alleviated by any known factors. No recent antibiotics use. Patient denies symptoms of UTI, chest pain, shortness breath, fever, chills. He has mild cough due to smoking, which is at his baseline. No unilateral weakness. Patient denies drug use and drinking alcohol. Pt states that he stopped taking all his medications since 04/2015 because he felt that he dose not need to take it.  ED Course: pt was found to have WBC of 12.8, lipase 16, temperature 99.6, bradycardia, respiration rate 20, oxygen saturation 100%, electrolytes and renal function okay. Blood pressure elevated at 186/101. Patient is placed on MedSurg bed for observation.  Review of Systems:   General: no fevers, chills, no changes in body weight, has poor appetite, has fatigue HEENT: no blurry vision, hearing changes or sore throat Pulm: no dyspnea, has mild coughing, no wheezing CV: no chest pain, no palpitations Abd: has nausea,  vomiting, abdominal pain, diarrhea, no constipation GU: no dysuria, burning on urination, increased urinary frequency, hematuria  Ext: no leg edema Neuro: no unilateral weakness, numbness, or tingling, no vision change or hearing loss Skin: no rash MSK: No muscle spasm, no deformity, no limitation of range of movement in spin Heme: No easy bruising.  Travel history: No recent long distant travel.  Allergy: No Known Allergies  Past Medical History  Diagnosis Date  . Hypertension   . Unspecified asthma(493.90)   . Chest pain, atypical   . Nontoxic multinodular goiter   . Dyspnea on exertion   . Trigeminal neuralgia   . Cluster headache   . Hyperlipidemia   . GERD (gastroesophageal reflux disease)   . Complication of anesthesia     difficult waking up"  . Hiatal hernia   . Pulmonary nodule --- CT 10/19/2014: Nodule is stable, no further routine x-rays 01/06/2009  . Hypothyroidism   . TIA (transient ischemic attack)   . BPH (benign prostatic hyperplasia)   . Drug abuse     Past Surgical History  Procedure Laterality Date  . Appendectomy    . Knee arthroscopy    . 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 Right shoulder redo-reconstruction  06/21/10  . Destruction trigeminal nerve via neurolytic agent  x 3 , R side last ~2010  . Cardiac catheterization N/A 11/25/2014    Procedure: Left Heart Cath and Coronary Angiography;  Surgeon: Belva Crome, MD;  Location: [facility];  Service: Cardiovascular;  Laterality: N/A;    Social History:  reports that he has been smoking Cigarettes.  He has a 4.5 pack-year smoking history. He has never used smokeless tobacco. He reports that he does not drink alcohol or use illicit drugs.  Family History:  Family History  Problem Relation Age of Onset  . Diabetes Mother   . Hyperlipidemia Mother   . Hypertension Mother   . Hypertension Father   . Heart disease Father     CAD/CHF  . Cancer Father      Mesothelioma  . Heart disease Maternal Aunt   . Prostate cancer Neg Hx   . Colon cancer Neg Hx      Prior to Admission medications   Not on File    Physical Exam: Filed Vitals:   09/22/15 1630 09/22/15 1706 09/22/15 1722 09/22/15 1819  BP: 171/102 180/100  182/91  Pulse: 54 57  52  Temp:    99.6 F (37.6 C)  TempSrc:    Oral  Resp:   18 20  Height:    5\' 10"  (1.778 m)  Weight:    81.648 kg (180 lb)  SpO2: 99% 99%  100%   General: Not in acute distress. Dry mucous membrane. HEENT:       Eyes: PERRL, EOMI, no scleral icterus.       ENT: No discharge from the ears and nose, no pharynx injection, no tonsillar enlargement.        Neck: No JVD, no bruit, no mass felt. Heme: No neck lymph node enlargement. Cardiac: S1/S2, RRR, No murmurs, No gallops or rubs. Pulm: No rales, wheezing, rhonchi or rubs. Abd: Soft, nondistended, has diffused tenderness, no rebound pain, no organomegaly, BS present. GU: No hematuria Ext: No pitting leg edema bilaterally. 2+DP/PT pulse bilaterally. Musculoskeletal: No joint deformities, No joint redness or warmth, no limitation of ROM in spin. Skin: No rashes.  Neuro: Alert, oriented X3, cranial nerves II-XII grossly intact, moves all extremities normally. Psych: Patient is not psychotic, no suicidal or hemocidal ideation.  Labs on Admission: I have personally reviewed following labs and imaging studies  CBC:  Recent Labs Lab 09/22/15 1200  WBC 12.8*  NEUTROABS 10.5*  HGB 17.6*  HCT 49.5  MCV 85.9  PLT 0000000   Basic Metabolic Panel:  Recent Labs Lab 09/22/15 1310  NA 140  K 3.5  CL 104  CO2 22  GLUCOSE 148*  BUN 19  CREATININE 1.02  CALCIUM 9.1   GFR: Estimated Creatinine Clearance: 87.5 mL/min (by C-G formula based on Cr of 1.02). Liver Function Tests:  Recent Labs Lab 09/22/15 1310  AST 18  ALT 15*  ALKPHOS 50  BILITOT 0.9  PROT 7.6  ALBUMIN 4.2    Recent Labs Lab 09/22/15 1310  LIPASE 16   No results for  input(s): AMMONIA in the last 168 hours. Coagulation Profile: No results for input(s): INR, PROTIME in the last 168 hours. Cardiac Enzymes: No results for input(s): CKTOTAL, CKMB, CKMBINDEX, TROPONINI in the last 168 hours. BNP (last 3 results) No results for input(s): PROBNP in the last 8760 hours. HbA1C: No results for input(s): HGBA1C in the last 72 hours. CBG: No results for input(s): GLUCAP in the last 168 hours. Lipid Profile: No results for input(s): CHOL, HDL, LDLCALC, TRIG, CHOLHDL, LDLDIRECT in the last 72 hours. Thyroid Function Tests: No results for input(s): TSH, T4TOTAL, FREET4, T3FREE, THYROIDAB in the last 72 hours. Anemia Panel: No results for input(s): VITAMINB12, FOLATE, FERRITIN, TIBC, IRON, RETICCTPCT in the last 72 hours. Urine analysis:    Component Value Date/Time   COLORURINE YELLOW  02/04/2014 0830   APPEARANCEUR CLEAR 02/04/2014 0830   LABSPEC 1.015 02/04/2014 0830   PHURINE 7.0 02/04/2014 0830   GLUCOSEU NEGATIVE 02/04/2014 0830   GLUCOSEU NEGATIVE 05/12/2013 1657   HGBUR NEGATIVE 02/04/2014 0830   BILIRUBINUR NEGATIVE 02/04/2014 0830   KETONESUR NEGATIVE 02/04/2014 0830   PROTEINUR NEGATIVE 05/12/2013 1657   UROBILINOGEN 0.2 02/04/2014 0830   NITRITE NEGATIVE 02/04/2014 0830   LEUKOCYTESUR NEGATIVE 02/04/2014 0830   Sepsis Labs: @LABRCNTIP (procalcitonin:4,lacticidven:4) )No results found for this or any previous visit (from the past 240 hour(s)).   Radiological Exams on Admission: No results found.   EKG: Not done in ED, will get one.   Assessment/Plan Principal Problem:   Nausea vomiting and diarrhea Active Problems:   Essential hypertension   Transient cerebral ischemia   GERD   Tobacco abuse   BPH associated with nocturia   Hypothyroidism   Abdominal pain   HLD (hyperlipidemia)   Nausea vomiting, diarrhea and AP: Etiology is not clear. Lipase is normal, less likely to have pancreatitis. Differential diagnosis includes viral  gastroenteritis, colitis, drug abuse and bowel obstruction. Patient is clinically dehydrated, but hemodynamically stable.  -Will place on med-surg bed for obs -check c diff pcr and GI path panel -CT-abd/pelvis -prn Zofran for nausea and morphine for pain (patient cannot tolerate oral pain medication) -IVF: per EDP's note, pt has received 3L of NS in ED, will continue NS at 125 cc/h - check lactate level  HTN: bp is 186/101. Not compliant to medications -Start amlodipine 10 mg daily -IV hydralazine when necessary  Hx of Transient cerebral ischemia: No acute issues. -restart ASA  GERD: -Pepcid IV  BPH: stable - Continue Flomax  Tobacco abuse: -Did counseling about the importance of quitting smoking -Nicotine patch  Hypothyroidism: Last TSH was 0.76 on 02/04/14. Not taking his Synthroid since January 2017. -Check TSH  HLD: Last LDL was 137 on 10/14/14. Not taking his Pravachol since January 2017 -Check FLP  DVT ppx: SCD Code Status: Full code Family Communication: Yes, patient's wife at bed side Disposition Plan:  Anticipate discharge back to previous home environment Consults called:  none Admission status: Med-surg/obs   Date of Service 09/22/2015    Ivor Costa [facility] Pager [phone]  If 7PM-7AM, please contact night-coverage [email or web address] Password [facility] 09/22/2015, 8:34 PM

## 2015-09-23 DIAGNOSIS — N401 Enlarged prostate with lower urinary tract symptoms: Secondary | ICD-10-CM

## 2015-09-23 DIAGNOSIS — I10 Essential (primary) hypertension: Secondary | ICD-10-CM

## 2015-09-23 DIAGNOSIS — K219 Gastro-esophageal reflux disease without esophagitis: Secondary | ICD-10-CM | POA: Diagnosis not present

## 2015-09-23 DIAGNOSIS — R112 Nausea with vomiting, unspecified: Secondary | ICD-10-CM | POA: Diagnosis not present

## 2015-09-23 DIAGNOSIS — R197 Diarrhea, unspecified: Secondary | ICD-10-CM

## 2015-09-23 DIAGNOSIS — R351 Nocturia: Secondary | ICD-10-CM

## 2015-09-23 DIAGNOSIS — E785 Hyperlipidemia, unspecified: Secondary | ICD-10-CM

## 2015-09-23 DIAGNOSIS — E039 Hypothyroidism, unspecified: Secondary | ICD-10-CM

## 2015-09-23 DIAGNOSIS — R1084 Generalized abdominal pain: Secondary | ICD-10-CM | POA: Diagnosis not present

## 2015-09-23 LAB — BASIC METABOLIC PANEL
Anion gap: 14 (ref 5–15)
BUN: 11 mg/dL (ref 6–20)
CO2: 23 mmol/L (ref 22–32)
Calcium: 9.3 mg/dL (ref 8.9–10.3)
Chloride: 103 mmol/L (ref 101–111)
Creatinine, Ser: 0.95 mg/dL (ref 0.61–1.24)
GFR calc Af Amer: >60 mL/min (ref >60)
GFR calc non Af Amer: >60 mL/min (ref >60)
Glucose, Bld: 107 mg/dL — ABNORMAL HIGH (ref 65–99)
Potassium: 3.3 mmol/L — ABNORMAL LOW (ref 3.5–5.1)
Sodium: 140 mmol/L (ref 135–145)

## 2015-09-23 LAB — HIV ANTIBODY (ROUTINE TESTING W REFLEX): HIV Screen 4th Generation wRfx: NONREACTIVE

## 2015-09-23 LAB — URINALYSIS, ROUTINE W REFLEX MICROSCOPIC
Bilirubin Urine: NEGATIVE
Glucose, UA: NEGATIVE mg/dL
Hgb urine dipstick: NEGATIVE
Ketones, ur: 40 mg/dL — AB
Leukocytes, UA: NEGATIVE
Nitrite: NEGATIVE
Protein, ur: NEGATIVE mg/dL
Specific Gravity, Urine: 1.02 (ref 1.005–1.030)
pH: 6.5 (ref 5.0–8.0)

## 2015-09-23 LAB — C DIFFICILE QUICK SCREEN W PCR REFLEX
C Diff antigen: POSITIVE — AB
C Diff toxin: NEGATIVE

## 2015-09-23 LAB — GASTROINTESTINAL PANEL BY PCR, STOOL (REPLACES STOOL CULTURE)
Adenovirus F40/41: NOT DETECTED
Astrovirus: NOT DETECTED
Campylobacter species: NOT DETECTED
Cryptosporidium: NOT DETECTED
Cyclospora cayetanensis: NOT DETECTED
E. coli O157: NOT DETECTED
Entamoeba histolytica: NOT DETECTED
Enteroaggregative E coli (EAEC): NOT DETECTED
Enteropathogenic E coli (EPEC): DETECTED — AB
Enterotoxigenic E coli (ETEC): NOT DETECTED
Giardia lamblia: NOT DETECTED
Norovirus GI/GII: NOT DETECTED
Plesimonas shigelloides: NOT DETECTED
Rotavirus A: NOT DETECTED
Salmonella species: NOT DETECTED
Sapovirus (I, II, IV, and V): NOT DETECTED
Shiga like toxin producing E coli (STEC): NOT DETECTED
Shigella/Enteroinvasive E coli (EIEC): NOT DETECTED
Vibrio cholerae: NOT DETECTED
Vibrio species: NOT DETECTED
Yersinia enterocolitica: NOT DETECTED

## 2015-09-23 LAB — RAPID URINE DRUG SCREEN, HOSP PERFORMED
Amphetamines: NOT DETECTED
Barbiturates: NOT DETECTED
Benzodiazepines: NOT DETECTED
Cocaine: NOT DETECTED
Opiates: POSITIVE — AB
Tetrahydrocannabinol: NOT DETECTED

## 2015-09-23 LAB — CBC
HCT: 48.4 % (ref 39.0–52.0)
Hemoglobin: 16.6 g/dL (ref 13.0–17.0)
MCH: 29.9 pg (ref 26.0–34.0)
MCHC: 34.3 g/dL (ref 30.0–36.0)
MCV: 87.1 fL (ref 78.0–100.0)
Platelets: 185 10*3/uL (ref 150–400)
RBC: 5.56 MIL/uL (ref 4.22–5.81)
RDW: 13.2 % (ref 11.5–15.5)
WBC: 16 10*3/uL — ABNORMAL HIGH (ref 4.0–10.5)

## 2015-09-23 LAB — LACTIC ACID, PLASMA: Lactic Acid, Venous: 1.7 mmol/L (ref 0.5–2.0)

## 2015-09-23 LAB — LIPID PANEL
Cholesterol: 299 mg/dL — ABNORMAL HIGH (ref 0–200)
HDL: 65 mg/dL (ref >40)
LDL Cholesterol: 219 mg/dL — ABNORMAL HIGH (ref 0–99)
Total CHOL/HDL Ratio: 4.6 RATIO
Triglycerides: 73 mg/dL (ref <150)
VLDL: 15 mg/dL (ref 0–40)

## 2015-09-23 LAB — GLUCOSE, CAPILLARY: Glucose-Capillary: 115 mg/dL — ABNORMAL HIGH (ref 65–99)

## 2015-09-23 LAB — TSH: TSH: 0.807 u[IU]/mL (ref 0.350–4.500)

## 2015-09-23 MED ORDER — ZOLPIDEM TARTRATE 5 MG PO TABS
5.0000 mg | ORAL_TABLET | Freq: Every day | ORAL | Status: DC
  Administered 2015-09-23: 5 mg via ORAL
  Filled 2015-09-23: qty 1

## 2015-09-23 MED ORDER — METRONIDAZOLE IN NACL 5-0.79 MG/ML-% IV SOLN
500.0000 mg | Freq: Three times a day (TID) | INTRAVENOUS | Status: DC
Start: 2015-09-23 — End: 2015-09-24
  Administered 2015-09-23 – 2015-09-24 (×3): 500 mg via INTRAVENOUS
  Filled 2015-09-23 (×4): qty 100

## 2015-09-23 MED ORDER — SODIUM CHLORIDE 0.9 % IV SOLN
INTRAVENOUS | Status: DC
  Administered 2015-09-23: 13:00:00 via INTRAVENOUS

## 2015-09-23 MED ORDER — DIPHENHYDRAMINE HCL 25 MG PO CAPS
25.0000 mg | ORAL_CAPSULE | Freq: Once | ORAL | Status: AC
  Administered 2015-09-23: 25 mg via ORAL
  Filled 2015-09-23: qty 1

## 2015-09-23 MED ORDER — PROCHLORPERAZINE EDISYLATE 5 MG/ML IJ SOLN
10.0000 mg | INTRAMUSCULAR | Status: DC | PRN
  Administered 2015-09-23: 10 mg via INTRAVENOUS
  Filled 2015-09-23: qty 2

## 2015-09-23 MED ORDER — PROCHLORPERAZINE EDISYLATE 5 MG/ML IJ SOLN
5.0000 mg | INTRAMUSCULAR | Status: DC | PRN
  Administered 2015-09-23: 5 mg via INTRAVENOUS
  Filled 2015-09-23: qty 2

## 2015-09-23 NOTE — Care Management Note (Signed)
Case Management Note  Patient Details  Name: [name] MRN: [id number] Date of Birth: 15-Feb-1961  Subjective/Objective:                 Patient admitted for enteritis. HTN, stopped taking BP meds at home because he lost weight and thought he would not need BP meds. Patient insured with PCP, independent PTA, works as Biomedical scientist. No CM needs identified at this time.   Action/Plan:  Will continue to follow for DC planning.   Expected Discharge Date:                  Expected Discharge Plan:  Home/Self Care  In-House Referral:     Discharge planning Services  CM Consult  Post Acute Care Choice:  NA Choice offered to:     DME Arranged:    DME Agency:     HH Arranged:    [place] Agency:     Status of Service:  Completed, signed off  Medicare Important Message Given:    Date Medicare IM Given:    Medicare IM give by:    Date Additional Medicare IM Given:    Additional Medicare Important Message give by:     If discussed at [place] of Stay Meetings, dates discussed:    Additional Comments:  Carles Collet, RN 09/23/2015, 3:51 PM

## 2015-09-23 NOTE — Progress Notes (Signed)
[facility]                                                                              Patient Demographics  [name], is a 55 y.o. male, DOB - 1960-08-23, [id number]  Admit date - 09/22/2015   Admitting Physician Ivor Costa, MD  Outpatient Primary MD for the patient is Kathlene November, MD  Outpatient specialists:   LOS -   days    Chief Complaint  Patient presents with  . Emesis       Brief summary   Patient is a 55 year old male with hypertension, hyperlipidemia, asthma, GERD, tobacco abuse, hiatal hernia, TIA, drug abuse presented with nausea, vomiting, diarrhea, abdominal pain for the past 3 days prior to admission. Patient reported more than 10 bowel movements on the day of admission and > 10 times vomiting, no hematemesis, hematochezia or melena. He was admitted for further workup.   Assessment & Plan    Principal Problem:   Nausea vomiting and diarrhea: Likely due to enteritis, patient also works as a Biomedical scientist and reported that his symptoms started after he ate a hot dog "that had been sitting there for too long", and he also works for multiple retirement communities - Obtain C. difficile PCR, GI pathogen panel - Placed on IV Flagyl - CT abdomen and pelvis showed mild cystitis otherwise no colitis, possible small renal cyst versus mass,  Outpatient workup with renal ultrasound or MRI - Started on clear liquid diet, advance as tolerated, continue IV fluid hydration  Active Problems:   Essential hypertension: BP currently elevated - Start Norvasc, IV hydralazine as needed    History of TIA - Continue aspirin    GERD - Continue IV PPI  Tobacco abuse: -Smoking counseling, Nicotine patch  Hypothyroidism: Last TSH was 0.76 on 02/04/14. Not taking his Synthroid since January 2017. -Check TSH  HLD: Last LDL was 137 on 10/14/14. Not taking his Pravachol since January 2017 -Check FLP  Code Status:  fc  DVT Prophylaxis:  scd   Family  Communication: Discussed in detail with the patient, all imaging results, lab results explained to the patient   Disposition Plan:   Time Spent in minutes   25 minutes  Procedures:  None  Consultants:   None  Antimicrobials:   IV Flagyl   Medications  Scheduled Meds: . amLODipine  10 mg Oral Daily  . aspirin EC  81 mg Oral Daily  . famotidine (PEPCID) IV  20 mg Intravenous Q12H  . metoCLOPramide (REGLAN) injection  10 mg Intravenous Q6H  . metronidazole  500 mg Intravenous Q8H  . nicotine  21 mg Transdermal Daily  . tamsulosin  0.4 mg Oral QPC supper  . zolpidem  5 mg Oral QHS   Continuous Infusions:  PRN Meds:.acetaminophen **OR** acetaminophen, hydrALAZINE, LORazepam, morphine, prochlorperazine   Antibiotics   Anti-infectives    Start     Dose/Rate Route Frequency Ordered Stop   09/23/15 1000  metroNIDAZOLE (FLAGYL) IVPB 500 mg     500 mg 100 mL/hr over 60 Minutes Intravenous Every 8 hours 09/23/15 0845          Subjective:  [name] was seen and examined today.  Patient denies dizziness, chest pain, shortness of breath, new weakness, numbess, tingling. No acute events overnight.  States no diarrhea at this time however still having nausea. Abdominal pain controlled  Objective:   Filed Vitals:   09/22/15 1819 09/23/15 0124 09/23/15 0501 09/23/15 1000  BP: 182/91 182/85 151/86 141/81  Pulse: 52 52 60 61  Temp: 99.6 F (37.6 C) 99.2 F (37.3 C) 98.2 F (36.8 C)   TempSrc: Oral Oral Oral   Resp: 20 18 18    Height: 5\' 10"  (1.778 m)     Weight: 81.648 kg (180 lb)     SpO2: 100% 100% 99%     Intake/Output Summary (Last 24 hours) at 09/23/15 1251 Last data filed at 09/23/15 0536  Gross per 24 hour  Intake   2000 ml  Output    900 ml  Net   1100 ml     Wt Readings from Last 3 Encounters:  09/22/15 81.648 kg (180 lb)  11/25/14 102.513 kg (226 lb)  11/24/14 103.137 kg (227 lb 6 oz)     Exam  General: Alert and oriented x 3,  NAD  HEENT:  PERRLA, EOMI, Anicteric Sclera, mucous membranes moist.   Neck: Supple, no JVD, no masses  Cardiovascular: S1 S2 auscultated, no rubs, murmurs or gallops. Regular rate and rhythm.  Respiratory: Clear to auscultation bilaterally, no wheezing, rales or rhonchi  Gastrointestinal: Soft, mild TTP , nondistended, + bowel sounds  Ext: no cyanosis clubbing or edema  Neuro: AAOx3, Cr N's II- XII. Strength 5/5 upper and lower extremities bilaterally  Skin: No rashes  Psych: Normal affect and demeanor, alert and oriented x3    Data Reviewed:  I have personally reviewed following labs and imaging studies  Micro Results Recent Results (from the past 240 hour(s))  C difficile quick scan w PCR reflex     Status: Abnormal   Collection Time: 09/23/15 11:13 AM  Result Value Ref Range Status   C Diff antigen POSITIVE (A) NEGATIVE Final   C Diff toxin NEGATIVE NEGATIVE Final   C Diff interpretation   Final    C. difficile present, but toxin not detected. This indicates colonization. In most cases, this does not require treatment. If patient has signs and symptoms consistent with colitis, consider treatment. Requires ENTERIC precautions.    Radiology Reports Ct Abdomen Pelvis W Contrast  09/22/2015  CLINICAL DATA:  Acute onset of nausea, vomiting and diarrhea. Generalized abdominal pain. Initial encounter. EXAM: CT ABDOMEN AND PELVIS WITH CONTRAST TECHNIQUE: Multidetector CT imaging of the abdomen and pelvis was performed using the standard protocol following bolus administration of intravenous contrast. CONTRAST:  154mL ISOVUE-300 IOPAMIDOL (ISOVUE-300) INJECTION 61% COMPARISON:  CT of the abdomen and pelvis performed 12/07/2011, and renal ultrasound performed 11/27/2014 FINDINGS: Minimal right basilar atelectasis is noted. Scattered coronary artery calcification is seen. The liver and spleen are unremarkable in appearance. The gallbladder is within normal limits. The pancreas and  adrenal glands are unremarkable. Scattered bilateral renal cysts are noted, more prominent on the left. There is a heterogeneous 1.9 x 1.6 cm focus arising at the lateral aspect of the left kidney, at the prior location of a cyst. Though this may simply reflect a collapsed cyst, a small solid mass cannot be excluded. No perinephric stranding is appreciated. There is no evidence of hydronephrosis. No renal or ureteral stones are identified. No free fluid is identified. The small bowel is unremarkable in appearance. The stomach is  within normal limits. No acute vascular abnormalities are seen. Scattered calcification is seen along the abdominal aorta and its branches. The patient is status post appendectomy. The colon is grossly unremarkable in appearance. The bladder is mildly distended. Minimal soft tissue inflammation about the bladder could reflect mild cystitis. The prostate remains normal in size. No inguinal lymphadenopathy is seen. No acute osseous abnormalities are identified. IMPRESSION: 1. Minimal soft tissue inflammation about the bladder could reflect mild cystitis. Alternately, it could remain within normal limits. 2. Heterogeneous 1.9 x 1.6 cm focus at the lateral aspect of the left kidney, at the prior location of a cyst. Though this may simply reflect a collapsed cyst, a small solid mass cannot be excluded. Renal ultrasound or MRI of the abdomen would be helpful for further evaluation, as deemed clinically appropriate. 3. Scattered bilateral renal cysts noted. 4. Scattered coronary artery calcifications seen. 5. Scattered calcification along the abdominal aorta and its branches. Electronically Signed   By: Garald Balding M.D.   On: 09/22/2015 22:54    Lab Data:  CBC:  Recent Labs Lab 09/22/15 1200 09/23/15 0539  WBC 12.8* 16.0*  NEUTROABS 10.5*  --   HGB 17.6* 16.6  HCT 49.5 48.4  MCV 85.9 87.1  PLT 193 [id number]   Basic Metabolic Panel:  Recent Labs Lab 09/22/15 1310 09/23/15 0539   NA 140 140  K 3.5 3.3*  CL 104 103  CO2 22 23  GLUCOSE 148* 107*  BUN 19 11  CREATININE 1.02 0.95  CALCIUM 9.1 9.3   GFR: Estimated Creatinine Clearance: 93.9 mL/min (by C-G formula based on Cr of 0.95). Liver Function Tests:  Recent Labs Lab 09/22/15 1310  AST 18  ALT 15*  ALKPHOS 50  BILITOT 0.9  PROT 7.6  ALBUMIN 4.2    Recent Labs Lab 09/22/15 1310  LIPASE 16   No results for input(s): AMMONIA in the last 168 hours. Coagulation Profile:  Recent Labs Lab 09/22/15 2053  INR 1.25   Cardiac Enzymes: No results for input(s): CKTOTAL, CKMB, CKMBINDEX, TROPONINI in the last 168 hours. BNP (last 3 results) No results for input(s): PROBNP in the last 8760 hours. HbA1C: No results for input(s): HGBA1C in the last 72 hours. CBG:  Recent Labs Lab 09/23/15 0753  GLUCAP 115*   Lipid Profile:  Recent Labs  09/23/15 0539  CHOL 299*  HDL 65  LDLCALC 219*  TRIG 73  CHOLHDL 4.6   Thyroid Function Tests:  Recent Labs  09/23/15 0539  TSH 0.807   Anemia Panel: No results for input(s): VITAMINB12, FOLATE, FERRITIN, TIBC, IRON, RETICCTPCT in the last 72 hours. Urine analysis:    Component Value Date/Time   COLORURINE YELLOW 09/23/2015 0538   APPEARANCEUR CLEAR 09/23/2015 0538   LABSPEC 1.020 09/23/2015 0538   PHURINE 6.5 09/23/2015 0538   GLUCOSEU NEGATIVE 09/23/2015 0538   GLUCOSEU NEGATIVE 02/04/2014 0830   HGBUR NEGATIVE 09/23/2015 0538   BILIRUBINUR NEGATIVE 09/23/2015 0538   KETONESUR 40* 09/23/2015 0538   PROTEINUR NEGATIVE 09/23/2015 0538   UROBILINOGEN 0.2 02/04/2014 0830   NITRITE NEGATIVE 09/23/2015 0538   LEUKOCYTESUR NEGATIVE 09/23/2015 [id number]     RAI,RIPUDEEP M.D. [facility] 09/23/2015, 12:51 PM  Pager: [phone] Between 7am to 7pm - call Pager - [phone]  After 7pm go to [email or web address] - password [email or web address]  Call night coverage person covering after 7pm

## 2015-09-23 NOTE — Progress Notes (Signed)
CRITICAL VALUE ALERT  Critical value received:  GI panel Enteropathogenic E. Coli  Date of notification:  09/23/15  Time of notification:  2103  Critical value read back:Yes.    Nurse who received alert:  Amaryllis Dyke  MD notified (1st page):  Marrion Coy, NP  Time of first page:  2110  MD notified (2nd page):  Time of second page:  Responding MD:    Time MD responded:

## 2015-09-24 DIAGNOSIS — K219 Gastro-esophageal reflux disease without esophagitis: Secondary | ICD-10-CM | POA: Diagnosis not present

## 2015-09-24 DIAGNOSIS — R112 Nausea with vomiting, unspecified: Secondary | ICD-10-CM | POA: Diagnosis not present

## 2015-09-24 DIAGNOSIS — N401 Enlarged prostate with lower urinary tract symptoms: Secondary | ICD-10-CM | POA: Diagnosis not present

## 2015-09-24 DIAGNOSIS — R1084 Generalized abdominal pain: Secondary | ICD-10-CM | POA: Diagnosis not present

## 2015-09-24 LAB — BASIC METABOLIC PANEL
Anion gap: 14 (ref 5–15)
BUN: 12 mg/dL (ref 6–20)
CO2: 22 mmol/L (ref 22–32)
Calcium: 9.3 mg/dL (ref 8.9–10.3)
Chloride: 104 mmol/L (ref 101–111)
Creatinine, Ser: 1.09 mg/dL (ref 0.61–1.24)
GFR calc Af Amer: >60 mL/min (ref >60)
GFR calc non Af Amer: >60 mL/min (ref >60)
Glucose, Bld: 93 mg/dL (ref 65–99)
Potassium: 3.6 mmol/L (ref 3.5–5.1)
Sodium: 140 mmol/L (ref 135–145)

## 2015-09-24 LAB — URINE CULTURE: Culture: NO GROWTH

## 2015-09-24 LAB — CBC
HCT: 50 % (ref 39.0–52.0)
Hemoglobin: 16.9 g/dL (ref 13.0–17.0)
MCH: 29.5 pg (ref 26.0–34.0)
MCHC: 33.8 g/dL (ref 30.0–36.0)
MCV: 87.4 fL (ref 78.0–100.0)
Platelets: 179 10*3/uL (ref 150–400)
RBC: 5.72 MIL/uL (ref 4.22–5.81)
RDW: 13.2 % (ref 11.5–15.5)
WBC: 15.5 10*3/uL — ABNORMAL HIGH (ref 4.0–10.5)

## 2015-09-24 LAB — GLUCOSE, CAPILLARY: Glucose-Capillary: 115 mg/dL — ABNORMAL HIGH (ref 65–99)

## 2015-09-24 LAB — HEMOGLOBIN A1C
Hgb A1c MFr Bld: 5.8 % — ABNORMAL HIGH (ref 4.8–5.6)
Mean Plasma Glucose: 120 mg/dL

## 2015-09-24 MED ORDER — CIPROFLOXACIN HCL 500 MG PO TABS
500.0000 mg | ORAL_TABLET | Freq: Two times a day (BID) | ORAL | Status: DC
  Administered 2015-09-24: 500 mg via ORAL
  Filled 2015-09-24: qty 1

## 2015-09-24 MED ORDER — ASPIRIN 81 MG PO TBEC: 81.0000 mg | DELAYED_RELEASE_TABLET | Freq: Every day | ORAL | Status: DC

## 2015-09-24 MED ORDER — PROMETHAZINE HCL 25 MG PO TABS: 25.0000 mg | ORAL_TABLET | Freq: Four times a day (QID) | ORAL | Status: DC | PRN

## 2015-09-24 MED ORDER — LOSARTAN POTASSIUM 50 MG PO TABS: 50.0000 mg | ORAL_TABLET | Freq: Every day | ORAL | Status: DC

## 2015-09-24 MED ORDER — TAMSULOSIN HCL 0.4 MG PO CAPS: 0.4000 mg | ORAL_CAPSULE | Freq: Every day | ORAL | Status: DC

## 2015-09-24 MED ORDER — ATORVASTATIN CALCIUM 20 MG PO TABS: 20.0000 mg | ORAL_TABLET | Freq: Every day | ORAL | Status: DC

## 2015-09-24 MED ORDER — CIPROFLOXACIN HCL 500 MG PO TABS: 500.0000 mg | ORAL_TABLET | Freq: Two times a day (BID) | ORAL | Status: DC

## 2015-09-24 MED ORDER — AMLODIPINE BESYLATE 10 MG PO TABS: 10.0000 mg | ORAL_TABLET | Freq: Every day | ORAL | Status: DC

## 2015-09-24 MED ORDER — NICOTINE 21 MG/24HR TD PT24: 21.0000 mg | MEDICATED_PATCH | Freq: Every day | TRANSDERMAL | Status: DC

## 2015-09-24 MED ORDER — TRAMADOL HCL 50 MG PO TABS: 50.0000 mg | ORAL_TABLET | Freq: Three times a day (TID) | ORAL | Status: DC | PRN

## 2015-09-24 NOTE — Discharge Summary (Signed)
Physician Discharge Summary   Patient ID: [name] MRN: [id number] DOB/AGE: 53-Jan-1962 53 y.o.  Admit date: 09/22/2015 Discharge date: 09/24/2015  Primary Care Physician:  Kathlene November, MD  Discharge Diagnoses:    . Ecoli Gastroenteritis -resolved . Tobacco abuse . Essential hypertension . Transient cerebral ischemia . GERD . Hypothyroidism . Abdominal pain . HLD (hyperlipidemia) . BPH associated with nocturia  Consults:  none  Recommendations for Outpatient Follow-up:  1. Please repeat CBC/BMET at next visit - CT abdomen and pelvis showed mild cystitis otherwise no colitis, possible small renal cyst versus mass, Outpatient workup with renal ultrasound or MRI    DIET: regular diet    Allergies:  No Known Allergies   DISCHARGE MEDICATIONS: Current Discharge Medication List    START taking these medications   Details  amLODipine (NORVASC) 10 MG tablet Take 1 tablet (10 mg total) by mouth daily. Qty: 30 tablet, Refills: 3    aspirin EC 81 MG EC tablet Take 1 tablet (81 mg total) by mouth daily. Qty: 30 tablet, Refills: 3    atorvastatin (LIPITOR) 20 MG tablet Take 1 tablet (20 mg total) by mouth at bedtime. Qty: 30 tablet, Refills: 3    ciprofloxacin (CIPRO) 500 MG tablet Take 1 tablet (500 mg total) by mouth 2 (two) times daily. X 3 days Qty: 6 tablet, Refills: 0    losartan (COZAAR) 50 MG tablet Take 1 tablet (50 mg total) by mouth daily. Qty: 30 tablet, Refills: 3    nicotine (NICODERM CQ - DOSED IN MG/24 HOURS) 21 mg/24hr patch Place 1 patch (21 mg total) onto the skin daily. Qty: 28 patch, Refills: 0    promethazine (PHENERGAN) 25 MG tablet Take 1 tablet (25 mg total) by mouth every 6 (six) hours as needed for nausea or vomiting. Qty: 30 tablet, Refills: 0    tamsulosin (FLOMAX) 0.4 MG CAPS capsule Take 1 capsule (0.4 mg total) by mouth daily after supper. Qty: 30 capsule, Refills: 1    traMADol (ULTRAM) 50 MG tablet Take 1 tablet (50 mg total) by  mouth every 8 (eight) hours as needed for moderate pain. Qty: 30 tablet, Refills: 0      STOP taking these medications     ibuprofen (ADVIL,MOTRIN) 200 MG tablet      lansoprazole (PREVACID) 30 MG capsule          Brief H and P: For complete details please refer to admission H and P, but in brief Patient is a 55 year old male with hypertension, hyperlipidemia, asthma, GERD, tobacco abuse, hiatal hernia, TIA, drug abuse presented with nausea, vomiting, diarrhea, abdominal pain for the past 3 days prior to admission. Patient reported more than 10 bowel movements on the day of admission and > 10 times vomiting, no hematemesis, hematochezia or melena. He was admitted for further workup.  Hospital Course:   Nausea vomiting and diarrhea: Due to gastroenteritis. Resolved  - Stool cultures were positive for C. difficile antigen but not toxin due to C. difficile colonization. GI pathogen panel was positive for Enterobacter Escherichia coli. - Patient was given symptomatically treatment with IV fluids, pain control, IV Flagyl. Discussed in detail with infectious disease, Dr. 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 Placed on tramadol for pain and Phenergan for nausea. - CT abdomen and pelvis showed mild cystitis otherwise no colitis, possible small renal cyst versus mass, Outpatient workup with renal ultrasound or MRI     Essential hypertension:  BP currently elevated - Placed on Norvasc and losartan   History of TIA - Continue aspirin - Placed on Lipitor, lipid panel showed LDL of 290   GERD - PPI discontinued as C. difficile colonization present  Tobacco abuse: -Smoking counseling, Nicotine patch  Hypothyroidism: Last TSH was 0.76 on 02/04/14. Not taking his Synthroid since January 2017. -TSH 0.8  HLD: Last LDL was 137 on 10/14/14. Not taking his Pravachol since January 2017 Lipid panel showed LDL of 219, placed on  Lipitor  Day of Discharge BP 151/86 mmHg  Pulse 55  Temp(Src) 98.2 F (36.8 C) (Oral)  Resp 12  Ht 5\' 10"  (1.778 m)  Wt 74.662 kg (164 lb 9.6 oz)  BMI 23.62 kg/m2  SpO2 98%  Physical Exam: General: Alert and awake oriented x3 not in any acute distress. HEENT: anicteric sclera, pupils reactive to light and accommodation CVS: S1-S2 clear no murmur rubs or gallops Chest: clear to auscultation bilaterally, no wheezing rales or rhonchi Abdomen: soft nontender, nondistended, normal bowel sounds Extremities: no cyanosis, clubbing or edema noted bilaterally Neuro: Cranial nerves II-XII intact, no focal neurological deficits   The results of significant diagnostics from this hospitalization (including imaging, microbiology, ancillary and laboratory) are listed below for reference.    LAB RESULTS: Basic Metabolic Panel:  Recent Labs Lab 09/23/15 0539 09/24/15 0550  NA 140 140  K 3.3* 3.6  CL 103 104  CO2 23 22  GLUCOSE 107* 93  BUN 11 12  CREATININE 0.95 1.09  CALCIUM 9.3 9.3   Liver Function Tests:  Recent Labs Lab 09/22/15 1310  AST 18  ALT 15*  ALKPHOS 50  BILITOT 0.9  PROT 7.6  ALBUMIN 4.2    Recent Labs Lab 09/22/15 1310  LIPASE 16   No results for input(s): AMMONIA in the last 168 hours. CBC:  Recent Labs Lab 09/22/15 1200 09/23/15 0539 09/24/15 0550  WBC 12.8* 16.0* 15.5*  NEUTROABS 10.5*  --   --   HGB 17.6* 16.6 16.9  HCT 49.5 48.4 50.0  MCV 85.9 87.1 87.4  PLT 193 185 179   Cardiac Enzymes: No results for input(s): CKTOTAL, CKMB, CKMBINDEX, TROPONINI in the last 168 hours. BNP: Invalid input(s): POCBNP CBG:  Recent Labs Lab 09/23/15 0753 09/24/15 0736  GLUCAP 115* 115*    Significant Diagnostic Studies:  Ct Abdomen Pelvis W Contrast  09/22/2015  CLINICAL DATA:  Acute onset of nausea, vomiting and diarrhea. Generalized abdominal pain. Initial encounter. EXAM: CT ABDOMEN AND PELVIS WITH CONTRAST TECHNIQUE: Multidetector CT imaging  of the abdomen and pelvis was performed using the standard protocol following bolus administration of intravenous contrast. CONTRAST:  169mL ISOVUE-300 IOPAMIDOL (ISOVUE-300) INJECTION 61% COMPARISON:  CT of the abdomen and pelvis performed 12/07/2011, and renal ultrasound performed 11/27/2014 FINDINGS: Minimal right basilar atelectasis is noted. Scattered coronary artery calcification is seen. The liver and spleen are unremarkable in appearance. The gallbladder is within normal limits. The pancreas and adrenal glands are unremarkable. Scattered bilateral renal cysts are noted, more prominent on the left. There is a heterogeneous 1.9 x 1.6 cm focus arising at the lateral aspect of the left kidney, at the prior location of a cyst. Though this may simply reflect a collapsed cyst, a small solid mass cannot be excluded. No perinephric stranding is appreciated. There is no evidence of hydronephrosis. No renal or ureteral stones are identified. No free fluid is identified. The small bowel is unremarkable in appearance. The stomach is within normal limits. No acute vascular abnormalities  are seen. Scattered calcification is seen along the abdominal aorta and its branches. The patient is status post appendectomy. The colon is grossly unremarkable in appearance. The bladder is mildly distended. Minimal soft tissue inflammation about the bladder could reflect mild cystitis. The prostate remains normal in size. No inguinal lymphadenopathy is seen. No acute osseous abnormalities are identified. IMPRESSION: 1. Minimal soft tissue inflammation about the bladder could reflect mild cystitis. Alternately, it could remain within normal limits. 2. Heterogeneous 1.9 x 1.6 cm focus at the lateral aspect of the left kidney, at the prior location of a cyst. Though this may simply reflect a collapsed cyst, a small solid mass cannot be excluded. Renal ultrasound or MRI of the abdomen would be helpful for further evaluation, as deemed  clinically appropriate. 3. Scattered bilateral renal cysts noted. 4. Scattered coronary artery calcifications seen. 5. Scattered calcification along the abdominal aorta and its branches. Electronically Signed   By: Garald Balding M.D.   On: 09/22/2015 22:54    2D ECHO:   Disposition and Follow-up: Discharge Instructions    Diet - low sodium heart healthy    Complete by:  As directed      Increase activity slowly    Complete by:  As directed             DISPOSITION: home    DISCHARGE FOLLOW-UP Follow-up Information    Follow up with Kathlene November, MD. Schedule an appointment as soon as possible for a visit in 10 days.   Specialty:  Internal Medicine   Why:  for hospital follow-up   Contact information:   [address] [place] [address] [phone]        Time spent on Discharge: 28mins   Signed:   Khyre Germond M.D. [facility] 09/24/2015, 1:15 PM Pager: [phone]

## 2015-09-24 NOTE — Progress Notes (Signed)
NURSING PROGRESS NOTE  [name] [id number] Discharge Data: 09/24/2015 2:06 PM Attending Provider: Mendel Corning, MD [id number] [name], MD     [name] to be D/C'd Home per MD order.  Discussed with the patient the After Visit Summary and all questions fully answered. All IV's discontinued with no bleeding noted. All belongings returned to patient for patient to take home.   Last Vital Signs:  Blood pressure 151/86, pulse 55, temperature 98.2 F (36.8 C), temperature source Oral, resp. rate 12, height 5\' 10"  (1.778 m), weight 74.662 kg (164 lb 9.6 oz), SpO2 98 %.  Discharge Medication List   Medication List    STOP taking these medications        ibuprofen 200 MG tablet  Commonly known as:  ADVIL,MOTRIN     lansoprazole 30 MG capsule  Commonly known as:  PREVACID      TAKE these medications        amLODipine 10 MG tablet  Commonly known as:  NORVASC  Take 1 tablet (10 mg total) by mouth daily.     aspirin 81 MG EC tablet  Take 1 tablet (81 mg total) by mouth daily.     atorvastatin 20 MG tablet  Commonly known as:  LIPITOR  Take 1 tablet (20 mg total) by mouth at bedtime.     ciprofloxacin 500 MG tablet  Commonly known as:  CIPRO  Take 1 tablet (500 mg total) by mouth 2 (two) times daily. X 3 days     losartan 50 MG tablet  Commonly known as:  COZAAR  Take 1 tablet (50 mg total) by mouth daily.     nicotine 21 mg/24hr patch  Commonly known as:  NICODERM CQ - dosed in mg/24 hours  Place 1 patch (21 mg total) onto the skin daily.     promethazine 25 MG tablet  Commonly known as:  PHENERGAN  Take 1 tablet (25 mg total) by mouth every 6 (six) hours as needed for nausea or vomiting.     tamsulosin 0.4 MG Caps capsule  Commonly known as:  FLOMAX  Take 1 capsule (0.4 mg total) by mouth daily after supper.     traMADol 50 MG tablet  Commonly known as:  ULTRAM  Take 1 tablet (50 mg total) by mouth every 8 (eight) hours as needed for moderate pain.          Charolette Child, RN

## 2015-09-27 ENCOUNTER — Telehealth: Payer: Self-pay | Admitting: Behavioral Health

## 2015-09-27 LAB — CULTURE, BLOOD (ROUTINE X 2)
Culture: NO GROWTH
Culture: NO GROWTH

## 2015-09-27 NOTE — Telephone Encounter (Addendum)
Transition Care Management Follow-up Telephone Call  Admit date: 09/22/2015 Discharge date: 09/24/2015  Primary Care Physician: Kathlene November, MD  Discharge Diagnoses:   . Ecoli Gastroenteritis -resolved . Tobacco abuse . Essential hypertension . Transient cerebral ischemia . GERD . Hypothyroidism . Abdominal pain . HLD (hyperlipidemia) . BPH associated with nocturia  Consults: none  Recommendations for Outpatient Follow-up:  1. Please repeat CBC/BMET at next visit - CT abdomen and pelvis showed mild cystitis otherwise no colitis, possible small renal cyst versus mass, Outpatient workup with renal ultrasound or MRI    How have you been since you were released from the hospital? Patient stated, "I'm still having some nausea and abdominal cramping, but doing much better".   Do you understand why you were in the hospital? yes, patient voiced that he was in the hospital for E. Coli.   Do you understand the discharge instructions? yes   Where were you discharged to? Home with wife.   Items Reviewed:  Medications reviewed: yes  Allergies reviewed: yes, NKA  Dietary changes reviewed: no changes, just a regular diet  Referrals reviewed: yes, follow-up with PCP; Please repeat CBC/BMET at next visit   Functional Questionnaire:   Activities of Daily Living (ADLs):   He states they are independent in the following: ambulation, bathing and hygiene, feeding, continence, grooming, toileting and dressing States they require assistance with the following: None   Any transportation issues/concerns?: no   Any patient concerns? no   Confirmed importance and date/time of follow-up visits scheduled yes, 09/29/15 at 11:30 AM.  Provider Appointment booked with Dr. [name].  Confirmed with patient if condition begins to worsen call PCP or go to the ER.  Patient was given the office number and encouraged to call back with question or concerns.  : yes

## 2015-09-27 NOTE — Telephone Encounter (Signed)
Attempted to reach patient for TCM/Hospital Follow-up. Left message for patient to return call when available.    

## 2015-09-27 NOTE — Addendum Note (Signed)
Addended by: Kathlen Brunswick on: 09/27/2015 05:25 PM   Modules accepted: Orders, Medications

## 2015-09-27 NOTE — Telephone Encounter (Signed)
Returning your call. °

## 2015-09-29 ENCOUNTER — Encounter: Payer: Self-pay | Admitting: Internal Medicine

## 2015-09-29 ENCOUNTER — Ambulatory Visit (INDEPENDENT_AMBULATORY_CARE_PROVIDER_SITE_OTHER): Payer: BLUE CROSS/BLUE SHIELD | Admitting: Internal Medicine

## 2015-09-29 VITALS — BP 126/72 | HR 88 | Temp 98.1°F | Ht 71.0 in | Wt 206.1 lb

## 2015-09-29 DIAGNOSIS — E049 Nontoxic goiter, unspecified: Secondary | ICD-10-CM | POA: Diagnosis not present

## 2015-09-29 DIAGNOSIS — I10 Essential (primary) hypertension: Secondary | ICD-10-CM

## 2015-09-29 DIAGNOSIS — K219 Gastro-esophageal reflux disease without esophagitis: Secondary | ICD-10-CM | POA: Diagnosis not present

## 2015-09-29 DIAGNOSIS — E039 Hypothyroidism, unspecified: Secondary | ICD-10-CM

## 2015-09-29 DIAGNOSIS — K529 Noninfective gastroenteritis and colitis, unspecified: Secondary | ICD-10-CM | POA: Diagnosis not present

## 2015-09-29 LAB — COMPREHENSIVE METABOLIC PANEL
ALT: 12 U/L (ref 0–53)
AST: 17 U/L (ref 0–37)
Albumin: 3.5 g/dL (ref 3.5–5.2)
Alkaline Phosphatase: 37 U/L — ABNORMAL LOW (ref 39–117)
BUN: 16 mg/dL (ref 6–23)
CO2: 32 mEq/L (ref 19–32)
Calcium: 8.8 mg/dL (ref 8.4–10.5)
Chloride: 102 mEq/L (ref 96–112)
Creatinine, Ser: 0.85 mg/dL (ref 0.40–1.50)
GFR: 121.49 mL/min (ref >60.00)
Glucose, Bld: 97 mg/dL (ref 70–99)
Potassium: 3.8 mEq/L (ref 3.5–5.1)
Sodium: 138 mEq/L (ref 135–145)
Total Bilirubin: 0.3 mg/dL (ref 0.2–1.2)
Total Protein: 6.1 g/dL (ref 6.0–8.3)

## 2015-09-29 MED ORDER — LOSARTAN POTASSIUM 50 MG PO TABS: 50.0000 mg | ORAL_TABLET | Freq: Every day | ORAL | Status: DC

## 2015-09-29 MED ORDER — AMLODIPINE BESYLATE 10 MG PO TABS: 10.0000 mg | ORAL_TABLET | Freq: Every day | ORAL | Status: DC

## 2015-09-29 MED ORDER — RANITIDINE HCL 300 MG PO TABS: 300.0000 mg | ORAL_TABLET | Freq: Every day | ORAL | Status: DC

## 2015-09-29 MED ORDER — ATORVASTATIN CALCIUM 20 MG PO TABS: 20.0000 mg | ORAL_TABLET | Freq: Every day | ORAL | Status: DC

## 2015-09-29 NOTE — Progress Notes (Signed)
Pre visit review using our clinic review tool, if applicable. No additional management support is needed unless otherwise documented below in the visit note. 

## 2015-09-29 NOTE — Progress Notes (Signed)
Subjective:    Patient ID: [name], male    DOB: 1960-10-28, 55 y.o.   MRN: [id number]  DOS:  09/29/2015 Type of visit - description : TCM Interval history: Hospital admission 09/22/2015  Had nausea, vomiting, diarrhea, DX gastroenteritis, tests showed C. Difficile colonization  but no infection, he had Enterobacter Escherichia coli. ID recommended 3 days of Cipro. CT abdomen and pelvis show mild cystitis and a well known renal mass. BP was elevated, Rx meds His history of TIA was prescribed Lipitor, still not taking it, needs a refill PPIs discontinued due to C. Difficile colonization Labs at the hospital: Potassium 3.6, creatinine 1.0, CBC white count of 15.5, hemoglobin and platelets normal, LDL 219, A1c 5.8, TSH 0.8   Review of Systems Since he left the hospital he took ciprofloxacin, he is taking his BP medication but not Lipitor. Still has some nausea and stomach problems but overall feels better. Diarrhea is resolved, appetite is okay. No fevers. No vomiting or blood in the stools. No dysuria, gross hematuria.  Past Medical History  Diagnosis Date  . Hypertension   . Unspecified asthma(493.90)   . Chest pain, atypical   . Nontoxic multinodular goiter   . Dyspnea on exertion   . Trigeminal neuralgia   . Cluster headache   . Hyperlipidemia   . GERD (gastroesophageal reflux disease)   . Complication of anesthesia     difficult waking up"  . Hiatal hernia   . Pulmonary nodule --- CT 10/19/2014: Nodule is stable, no further routine x-rays 01/06/2009  . Hypothyroidism   . TIA (transient ischemic attack)   . BPH (benign prostatic hyperplasia)   . Drug abuse     Past Surgical History  Procedure Laterality Date  . Appendectomy    . Knee arthroscopy    . 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 Right shoulder redo-reconstruction  06/21/10  . Destruction trigeminal nerve via neurolytic agent  x 3 , R side last ~2010  . Cardiac  catheterization N/A 11/25/2014    Procedure: Left Heart Cath and Coronary Angiography;  Surgeon: Belva Crome, MD;  Location: [facility];  Service: Cardiovascular;  Laterality: N/A;    Social History   Social History  . Marital Status: Married    Spouse Name: N/A  . Number of Children: 2  . 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 Smoking status: Current Every Day Smoker -- 0.25 packs/day for 18 years    Types: Cigarettes  . Smokeless tobacco: Never Used     Comment: 07/10/13 1/2 ppd  . Alcohol Use: No  . Drug Use: No  . Sexual Activity: Not on file   Other Topics Concern  . Not on file   Social History Narrative   HSG   Culinary school in [place]   Married - '84 - 5 years divorced; remarried '96   1 son - '85                     Medication List       This list is accurate as of: 09/29/15 11:59 PM.  Always use your most recent med list.               amLODipine 10 MG tablet  Commonly known as:  NORVASC  Take 1 tablet (10 mg total) by mouth  daily.     aspirin 81 MG EC tablet  Take 1 tablet (81 mg total) by mouth daily.     atorvastatin 20 MG tablet  Commonly known as:  LIPITOR  Take 1 tablet (20 mg total) by mouth at bedtime.     losartan 50 MG tablet  Commonly known as:  COZAAR  Take 1 tablet (50 mg total) by mouth daily.     promethazine 25 MG tablet  Commonly known as:  PHENERGAN  Take 1 tablet (25 mg total) by mouth every 6 (six) hours as needed for nausea or vomiting.     ranitidine 300 MG tablet  Commonly known as:  ZANTAC  Take 1 tablet (300 mg total) by mouth at bedtime.     traMADol 50 MG tablet  Commonly known as:  ULTRAM  Take 1 tablet (50 mg total) by mouth every 8 (eight) hours as needed for moderate pain.           Objective:   Physical Exam BP 126/72 mmHg  Pulse 88  Temp(Src) 98.1 F (36.7 C) (Oral)  Ht 5\' 11"  (1.803 m)  Wt 206 lb 2 oz  (93.498 kg)  BMI 28.76 kg/m2  SpO2 97% General:   Well developed, well nourished . NAD.  HEENT:  Normocephalic . Face symmetric, atraumatic. Neck: + Nodule on the right, firm, not tender, approximately 1 cm Lungs:  CTA B Normal respiratory effort, no intercostal retractions, no accessory muscle use. Heart: RRR,  no murmur.  no pretibial edema bilaterally  Abdomen:  Not distended, soft, mildly tender throughout the abdomen without ,mass, rebound or rigidity.  Skin: Not pale. Not jaundice Neurologic:  alert & oriented X3.  Speech normal, gait appropriate for age and unassisted Psych--  Cognition and judgment appear intact.  Cooperative with normal attention span and concentration.  Behavior appropriate. No anxious or depressed appearing.    Assessment & Plan:   Assessment Hyperglycemia, A1c 5.9 (2015) HTN Hyperlipidemia GERD Hypothyroidism Goiter -  [id number] FNA FOLLICULAR LESION OF UNCERTAIN SIGNIFICANCE, and another site neg  2011 FNA (-) malignancy Leukocytosis, h/o thrombocytopenia - not seen by hematology as of 09-2015 CAD:  ---Cath  2010 nonobstructive CAD ---Cath 11-2014: Nonobstructive CAD, less than 50%, Rx aggressive medical therapy H/o TIA BPH Drug abuse Cluster headaches H/o Pulmonary nodule, CT 2016 stable, no further schedule CTS H/o Renal mass, incidental on CT chest 2016, [place] 2 benign cysts  Plan: Gastroenteritis due to Escherichia coli, + C. Difficile colonization. Case was discussed with ID at the hospital stay, he was rx Cipro. Is gradually improving. He is a Biomedical scientist-- extreme caution recommended. Cystitis per CT of the hospital, asx, Udip was neg, thus observation, HTN: He stopped multiple medications a few months ago, back on losartan and amlodipine. Check a CMP Hyperlipidemia: Stopped Lipitor months ago, last LDL is quite elevated, prescription for Lipitor sent to the pharmacy. GERD  Episodic symptoms, ID recommend to hold PPIs due to C. difficile  colonization. Prescribed Zantac. Goiter: Has a nodule on the right thyroid. Will check a ultrasound. Hypothyroidism?: Not on synthroid for a while, TSH satisfactory, reassess in few weeks. Leukocytosis: A chronic issue, will send him to hematology in the near future. Will need a colonoscopy, will discuss on RTC Compliance --Has multiple medical issues and I am tracking multiple problems, asked him to give me the  opportunity to help him, recommend good medication compliance and come back for office visit as recommended. RTC 6 weeks fasting

## 2015-09-29 NOTE — Patient Instructions (Signed)
GO TO THE LAB : Get the blood work     GO TO THE FRONT DESK Schedule your next appointment for a check up , fasting in 5-6 weeks

## 2015-09-30 DIAGNOSIS — Z09 Encounter for follow-up examination after completed treatment for conditions other than malignant neoplasm: Secondary | ICD-10-CM | POA: Insufficient documentation

## 2015-09-30 NOTE — Assessment & Plan Note (Signed)
Gastroenteritis due to Escherichia coli, + C. Difficile colonization. Case was discussed with ID at the hospital stay, he was rx Cipro. Is gradually improving. He is a Biomedical scientist-- extreme caution recommended. Cystitis per CT of the hospital, asx, Udip was neg, thus observation, HTN: He stopped multiple medications a few months ago, back on losartan and amlodipine. Check a CMP Hyperlipidemia: Stopped Lipitor months ago, last LDL is quite elevated, prescription for Lipitor sent to the pharmacy. GERD  Episodic symptoms, ID recommend to hold PPIs due to C. difficile colonization. Prescribed Zantac. Goiter: Has a nodule on the right thyroid. Will check a ultrasound. Hypothyroidism?: Not on synthroid for a while, TSH satisfactory, reassess in few weeks. Leukocytosis: A chronic issue, will send him to hematology in the near future. Will need a colonoscopy, will discuss on RTC Compliance --Has multiple medical issues and I am tracking multiple problems, asked him to give me the  opportunity to help him, recommend good medication compliance and come back for office visit as recommended. RTC 6 weeks fasting

## 2015-10-01 ENCOUNTER — Ambulatory Visit (HOSPITAL_BASED_OUTPATIENT_CLINIC_OR_DEPARTMENT_OTHER)
Admission: RE | Admit: 2015-10-01 | Discharge: 2015-10-01 | Disposition: A | Discharge status: Home / Self Care | Payer: BLUE CROSS/BLUE SHIELD | Source: Ambulatory Visit | Attending: Internal Medicine | Admitting: Internal Medicine

## 2015-10-01 ENCOUNTER — Telehealth: Payer: Self-pay | Admitting: Internal Medicine

## 2015-10-01 DIAGNOSIS — E042 Nontoxic multinodular goiter: Secondary | ICD-10-CM | POA: Insufficient documentation

## 2015-10-01 DIAGNOSIS — E049 Nontoxic goiter, unspecified: Secondary | ICD-10-CM | POA: Diagnosis present

## 2015-10-01 NOTE — Telephone Encounter (Signed)
Notes Recorded by Damita Dunnings, CMA on 10/01/2015 at 3:44 PM [facility] informing Pt of ultrasound results and Dr. [name] recommendations to have Bx'ed. Instructed him to call if questions or concerns. ------  Notes Recorded by Colon Branch, MD on 10/01/2015 at 3:20 PM Please enter a order for a thyroid biopsy and let patient know

## 2015-10-01 NOTE — Addendum Note (Signed)
Addended byDamita Dunnings D on: 10/01/2015 03:44 PM   Modules accepted: Orders

## 2015-10-01 NOTE — Telephone Encounter (Signed)
Spoke w/ Pt, he wanted to know why his thyroid needed to be biopsied, informed him that per my understanding that him and Dr. [name] discussed the possibility that it would need to be biopsied, Pt denied that this was discussed with Dr. [name] and wanted to speak directly with him since I could not answer his question. Informed him that I would let Dr. [name] know that he has further questions. Pt verbalized understanding.

## 2015-10-01 NOTE — Telephone Encounter (Signed)
Pt returned CMA's call about lab results.    Please call back .  CB: [phone]

## 2015-10-04 NOTE — Telephone Encounter (Signed)
Discussed with the patient the fact that one of the thyroid nodules has increased in size and there is the potential for it to be malignant. States he will proceed with a biopsy. Also concerned about the elevated white count, I told patient  my plan is to refer him to hematology at the next opportunity

## 2015-10-06 ENCOUNTER — Ambulatory Visit
Admission: RE | Admit: 2015-10-06 | Discharge: 2015-10-06 | Disposition: A | Discharge status: Home / Self Care | Payer: BLUE CROSS/BLUE SHIELD | Source: Ambulatory Visit | Attending: Internal Medicine | Admitting: Internal Medicine

## 2015-10-06 ENCOUNTER — Other Ambulatory Visit (HOSPITAL_COMMUNITY)
Admission: RE | Admit: 2015-10-06 | Discharge: 2015-10-06 | Disposition: A | Discharge status: Home / Self Care | Payer: BLUE CROSS/BLUE SHIELD | Source: Ambulatory Visit | Attending: Interventional Radiology | Admitting: Interventional Radiology

## 2015-10-06 DIAGNOSIS — E049 Nontoxic goiter, unspecified: Secondary | ICD-10-CM | POA: Diagnosis not present

## 2015-10-08 ENCOUNTER — Telehealth: Payer: Self-pay | Admitting: Internal Medicine

## 2015-10-08 NOTE — Telephone Encounter (Signed)
Pt called in stating that the medication that he was on ranitidine is causing him to have nausea. He would like to be advised further on if there is a different medication that he could take.    Informed pt that due to the time he may not receive a FU call back today.    Please advise.

## 2015-10-08 NOTE — Telephone Encounter (Signed)
He was started on ranitidine for GERD instead of PPIs. GERD symptoms are sporadic. Recommend to stop ranitidine for now. If he has heartburn, to take milk of magnesia OTC as needed only.

## 2015-10-08 NOTE — Telephone Encounter (Signed)
Please advise 

## 2015-10-08 NOTE — Telephone Encounter (Signed)
Left a message for call back.  

## 2015-10-11 NOTE — Telephone Encounter (Signed)
Called patient and made him aware.  He stated understanding and agrees with plan.  No further questions or concerns voiced at this time.

## 2015-10-12 ENCOUNTER — Telehealth: Payer: Self-pay

## 2015-10-12 NOTE — Telephone Encounter (Signed)
Pt called in regarding Thyroid Bx results. Informed him that bx was benign/normal. Pt verbalized understanding.

## 2015-10-14 ENCOUNTER — Ambulatory Visit: Payer: BLUE CROSS/BLUE SHIELD | Admitting: Internal Medicine

## 2015-10-20 ENCOUNTER — Observation Stay (HOSPITAL_COMMUNITY): Payer: BLUE CROSS/BLUE SHIELD

## 2015-10-20 ENCOUNTER — Inpatient Hospital Stay (HOSPITAL_BASED_OUTPATIENT_CLINIC_OR_DEPARTMENT_OTHER)
Admission: EM | Admit: 2015-10-20 | Discharge: 2015-10-24 | DRG: 372 | Disposition: A | Discharge status: Home / Self Care | Payer: BLUE CROSS/BLUE SHIELD | Attending: Internal Medicine | Admitting: Internal Medicine

## 2015-10-20 ENCOUNTER — Encounter (HOSPITAL_BASED_OUTPATIENT_CLINIC_OR_DEPARTMENT_OTHER): Payer: Self-pay | Admitting: Emergency Medicine

## 2015-10-20 DIAGNOSIS — A047 Enterocolitis due to Clostridium difficile: Secondary | ICD-10-CM | POA: Diagnosis not present

## 2015-10-20 DIAGNOSIS — K5289 Other specified noninfective gastroenteritis and colitis: Secondary | ICD-10-CM

## 2015-10-20 DIAGNOSIS — N400 Enlarged prostate without lower urinary tract symptoms: Secondary | ICD-10-CM | POA: Diagnosis present

## 2015-10-20 DIAGNOSIS — K529 Noninfective gastroenteritis and colitis, unspecified: Secondary | ICD-10-CM

## 2015-10-20 DIAGNOSIS — Z8673 Personal history of transient ischemic attack (TIA), and cerebral infarction without residual deficits: Secondary | ICD-10-CM

## 2015-10-20 DIAGNOSIS — N179 Acute kidney failure, unspecified: Secondary | ICD-10-CM | POA: Diagnosis not present

## 2015-10-20 DIAGNOSIS — I10 Essential (primary) hypertension: Secondary | ICD-10-CM | POA: Diagnosis present

## 2015-10-20 DIAGNOSIS — R1110 Vomiting, unspecified: Secondary | ICD-10-CM | POA: Diagnosis present

## 2015-10-20 DIAGNOSIS — E860 Dehydration: Secondary | ICD-10-CM | POA: Insufficient documentation

## 2015-10-20 DIAGNOSIS — E440 Moderate protein-calorie malnutrition: Secondary | ICD-10-CM | POA: Diagnosis present

## 2015-10-20 DIAGNOSIS — I2510 Atherosclerotic heart disease of native coronary artery without angina pectoris: Secondary | ICD-10-CM | POA: Diagnosis present

## 2015-10-20 DIAGNOSIS — Z7982 Long term (current) use of aspirin: Secondary | ICD-10-CM

## 2015-10-20 DIAGNOSIS — K219 Gastro-esophageal reflux disease without esophagitis: Secondary | ICD-10-CM | POA: Diagnosis present

## 2015-10-20 DIAGNOSIS — Z221 Carrier of other intestinal infectious diseases: Secondary | ICD-10-CM

## 2015-10-20 DIAGNOSIS — E039 Hypothyroidism, unspecified: Secondary | ICD-10-CM

## 2015-10-20 DIAGNOSIS — E785 Hyperlipidemia, unspecified: Secondary | ICD-10-CM | POA: Diagnosis present

## 2015-10-20 DIAGNOSIS — Z6829 Body mass index (BMI) 29.0-29.9, adult: Secondary | ICD-10-CM

## 2015-10-20 DIAGNOSIS — R197 Diarrhea, unspecified: Secondary | ICD-10-CM | POA: Diagnosis present

## 2015-10-20 DIAGNOSIS — R112 Nausea with vomiting, unspecified: Secondary | ICD-10-CM

## 2015-10-20 DIAGNOSIS — F17210 Nicotine dependence, cigarettes, uncomplicated: Secondary | ICD-10-CM | POA: Diagnosis present

## 2015-10-20 LAB — COMPREHENSIVE METABOLIC PANEL
ALT: 18 U/L (ref 17–63)
AST: 21 U/L (ref 15–41)
Albumin: 5 g/dL (ref 3.5–5.0)
Alkaline Phosphatase: 61 U/L (ref 38–126)
Anion gap: 14 (ref 5–15)
BUN: 20 mg/dL (ref 6–20)
CO2: 19 mmol/L — ABNORMAL LOW (ref 22–32)
Calcium: 10.5 mg/dL — ABNORMAL HIGH (ref 8.9–10.3)
Chloride: 103 mmol/L (ref 101–111)
Creatinine, Ser: 1.4 mg/dL — ABNORMAL HIGH (ref 0.61–1.24)
GFR calc Af Amer: >60 mL/min (ref >60)
GFR calc non Af Amer: 56 mL/min — ABNORMAL LOW (ref >60)
Glucose, Bld: 120 mg/dL — ABNORMAL HIGH (ref 65–99)
Potassium: 3.6 mmol/L (ref 3.5–5.1)
Sodium: 136 mmol/L (ref 135–145)
Total Bilirubin: 1 mg/dL (ref 0.3–1.2)
Total Protein: 9 g/dL — ABNORMAL HIGH (ref 6.5–8.1)

## 2015-10-20 LAB — CBC WITH DIFFERENTIAL/PLATELET
Basophils Absolute: 0 10*3/uL (ref 0.0–0.1)
Basophils Relative: 0 %
Eosinophils Absolute: 0 10*3/uL (ref 0.0–0.7)
Eosinophils Relative: 0 %
HCT: 52.1 % — ABNORMAL HIGH (ref 39.0–52.0)
Hemoglobin: 18.5 g/dL — ABNORMAL HIGH (ref 13.0–17.0)
Lymphocytes Relative: 23 %
Lymphs Abs: 3.2 10*3/uL (ref 0.7–4.0)
MCH: 30.2 pg (ref 26.0–34.0)
MCHC: 35.5 g/dL (ref 30.0–36.0)
MCV: 85 fL (ref 78.0–100.0)
Monocytes Absolute: 0.8 10*3/uL (ref 0.1–1.0)
Monocytes Relative: 6 %
Neutro Abs: 9.7 10*3/uL — ABNORMAL HIGH (ref 1.7–7.7)
Neutrophils Relative %: 71 %
Platelets: 211 10*3/uL (ref 150–400)
RBC: 6.13 MIL/uL — ABNORMAL HIGH (ref 4.22–5.81)
RDW: 13.7 % (ref 11.5–15.5)
WBC: 13.7 10*3/uL — ABNORMAL HIGH (ref 4.0–10.5)

## 2015-10-20 LAB — TSH: TSH: 0.214 u[IU]/mL — ABNORMAL LOW (ref 0.350–4.500)

## 2015-10-20 LAB — C DIFFICILE QUICK SCREEN W PCR REFLEX
C Diff antigen: NEGATIVE
C Diff interpretation: NEGATIVE
C Diff toxin: NEGATIVE

## 2015-10-20 MED ORDER — ONDANSETRON HCL 4 MG/2ML IJ SOLN
4.0000 mg | Freq: Four times a day (QID) | INTRAMUSCULAR | Status: DC | PRN
  Administered 2015-10-20: 4 mg via INTRAVENOUS
  Filled 2015-10-20: qty 2

## 2015-10-20 MED ORDER — HYDRALAZINE HCL 20 MG/ML IJ SOLN: 5.0000 mg | INTRAMUSCULAR | Status: DC | PRN

## 2015-10-20 MED ORDER — HALOPERIDOL LACTATE 5 MG/ML IJ SOLN
2.0000 mg | Freq: Once | INTRAMUSCULAR | Status: AC
  Administered 2015-10-20: 2 mg via INTRAVENOUS
  Filled 2015-10-20: qty 1

## 2015-10-20 MED ORDER — SODIUM CHLORIDE 0.9 % IV SOLN
INTRAVENOUS | Status: AC
Start: 2015-10-20 — End: 2015-10-21
  Administered 2015-10-20 (×2): via INTRAVENOUS

## 2015-10-20 MED ORDER — MORPHINE SULFATE (PF) 2 MG/ML IV SOLN
1.0000 mg | INTRAVENOUS | Status: DC | PRN
  Administered 2015-10-20 – 2015-10-23 (×15): 1 mg via INTRAVENOUS
  Filled 2015-10-20 (×15): qty 1

## 2015-10-20 MED ORDER — ONDANSETRON HCL 4 MG/2ML IJ SOLN
4.0000 mg | Freq: Four times a day (QID) | INTRAMUSCULAR | Status: DC | PRN
  Administered 2015-10-21 – 2015-10-24 (×8): 4 mg via INTRAVENOUS
  Filled 2015-10-20 (×8): qty 2

## 2015-10-20 MED ORDER — LOPERAMIDE HCL 2 MG PO CAPS
4.0000 mg | ORAL_CAPSULE | Freq: Once | ORAL | Status: AC
  Administered 2015-10-20: 4 mg via ORAL
  Filled 2015-10-20: qty 2

## 2015-10-20 MED ORDER — CIPROFLOXACIN IN D5W 400 MG/200ML IV SOLN
400.0000 mg | Freq: Two times a day (BID) | INTRAVENOUS | Status: DC
  Administered 2015-10-20 – 2015-10-23 (×6): 400 mg via INTRAVENOUS
  Filled 2015-10-20 (×6): qty 200

## 2015-10-20 MED ORDER — PROMETHAZINE HCL 25 MG/ML IJ SOLN
25.0000 mg | Freq: Once | INTRAMUSCULAR | Status: AC
Start: 2015-10-20 — End: 2015-10-20
  Administered 2015-10-20: 25 mg via INTRAVENOUS
  Filled 2015-10-20: qty 1

## 2015-10-20 MED ORDER — CIPROFLOXACIN IN D5W 400 MG/200ML IV SOLN
400.0000 mg | Freq: Once | INTRAVENOUS | Status: AC
  Administered 2015-10-20: 400 mg via INTRAVENOUS
  Filled 2015-10-20: qty 200

## 2015-10-20 MED ORDER — SODIUM CHLORIDE 0.9 % IV SOLN
INTRAVENOUS | Status: DC
  Administered 2015-10-20: 08:00:00 via INTRAVENOUS

## 2015-10-20 MED ORDER — ONDANSETRON HCL 4 MG/2ML IJ SOLN
4.0000 mg | INTRAMUSCULAR | Status: AC
  Administered 2015-10-20 – 2015-10-21 (×4): 4 mg via INTRAVENOUS
  Filled 2015-10-20 (×4): qty 2

## 2015-10-20 MED ORDER — IOPAMIDOL (ISOVUE-300) INJECTION 61%
INTRAVENOUS | Status: AC
  Administered 2015-10-20: 100 mL
  Filled 2015-10-20: qty 100

## 2015-10-20 MED ORDER — PROCHLORPERAZINE EDISYLATE 5 MG/ML IJ SOLN
10.0000 mg | INTRAMUSCULAR | Status: DC | PRN
Start: 2015-10-20 — End: 2015-10-20

## 2015-10-20 MED ORDER — SODIUM CHLORIDE 0.9 % IV BOLUS (SEPSIS)
1000.0000 mL | Freq: Once | INTRAVENOUS | Status: AC
  Administered 2015-10-20: 1000 mL via INTRAVENOUS

## 2015-10-20 MED ORDER — METRONIDAZOLE IN NACL 5-0.79 MG/ML-% IV SOLN
500.0000 mg | Freq: Three times a day (TID) | INTRAVENOUS | Status: DC
  Administered 2015-10-20 – 2015-10-23 (×10): 500 mg via INTRAVENOUS
  Filled 2015-10-20 (×10): qty 100

## 2015-10-20 MED ORDER — DIATRIZOATE MEGLUMINE & SODIUM 66-10 % PO SOLN
ORAL | Status: AC
  Filled 2015-10-20: qty 30

## 2015-10-20 MED ORDER — METRONIDAZOLE IN NACL 5-0.79 MG/ML-% IV SOLN
500.0000 mg | Freq: Once | INTRAVENOUS | Status: AC
  Administered 2015-10-20: 500 mg via INTRAVENOUS
  Filled 2015-10-20: qty 100

## 2015-10-20 MED ORDER — PROCHLORPERAZINE EDISYLATE 5 MG/ML IJ SOLN
5.0000 mg | INTRAMUSCULAR | Status: DC | PRN
  Administered 2015-10-20 – 2015-10-23 (×5): 5 mg via INTRAVENOUS
  Filled 2015-10-20 (×6): qty 2

## 2015-10-20 MED ORDER — ONDANSETRON HCL 4 MG/2ML IJ SOLN
4.0000 mg | Freq: Once | INTRAMUSCULAR | Status: AC
  Administered 2015-10-20: 4 mg via INTRAVENOUS
  Filled 2015-10-20: qty 2

## 2015-10-20 MED ORDER — ENOXAPARIN SODIUM 40 MG/0.4ML ~~LOC~~ SOLN
40.0000 mg | SUBCUTANEOUS | Status: DC
  Administered 2015-10-20 – 2015-10-24 (×5): 40 mg via SUBCUTANEOUS
  Filled 2015-10-20 (×5): qty 0.4

## 2015-10-20 MED ORDER — ONDANSETRON HCL 40 MG/20ML IJ SOLN
8.0000 mg | Freq: Once | INTRAMUSCULAR | Status: AC
  Administered 2015-10-20: 8 mg via INTRAVENOUS
  Filled 2015-10-20: qty 4

## 2015-10-20 NOTE — H&P (Signed)
History and Physical    [name] [id number] DOB: Jan 17, 1961 DOA: 10/20/2015  PCP: Kathlene November, MD Patient coming from: [facility]home  Chief Complaint: persistent nausea/vomiting/diarrhea  HPI: [name] is a 55 y.o. male with medical history significant for hypertension, asthma, GERD, hypothyroidism, TIA, BPH, drug abuse and recent diagnosis of C. difficile presents to room 3 on 89M at [facility] from [facility] with the chief complaint of persistent nausea vomiting and diarrhea. Initial evaluation reveals acute kidney injury and is concerning for C. Difficile/colitis  Formation is obtained from the patient. He reports one month ago he was diagnosed with Escherichia coli gastroenteritis and treated with ciprofloxacin. He reports compliant with antibiotics and completed the treatment. He states yesterday hamburger and his symptoms began shortly thereafter. Describes the pain as abdomen cramp-like located throughout the abdomen. Eating or drinking makes it worse and nothing makes it better. He reports having 6-10 episodes of emesis in the last 24 hours. He denies coffee ground emesis. In addition he's had "several" episodes of watery stool. He denies dark stool or bright red blood per rectum. Associated symptoms include headache dizziness subjective fever and chills. He denies chest pain palpitation shortness of breath cough dysuria hematuria frequency or urgency.     ED Course: In the emergency department he is provided with IV fluids Cipro and Flagyl. He is also provided with Zofran 25 mg of Phenergan IV and Haldol continued to dry heave in the emergency department  Review of Systems: As per HPI otherwise 10 point review of systems negative.   Ambulatory Status: Ambulates independently with steady gait  Past Medical History  Diagnosis Date  . Hypertension   . Unspecified asthma(493.90)   . Chest pain, atypical   . Nontoxic multinodular goiter   . Dyspnea  on exertion   . Trigeminal neuralgia   . Cluster headache   . Hyperlipidemia   . GERD (gastroesophageal reflux disease)   . Complication of anesthesia     difficult waking up"  . Hiatal hernia   . Pulmonary nodule --- CT 10/19/2014: Nodule is stable, no further routine x-rays 01/06/2009  . Hypothyroidism   . TIA (transient ischemic attack)   . BPH (benign prostatic hyperplasia)   . Drug abuse     Past Surgical History  Procedure Laterality Date  . Appendectomy    . Knee arthroscopy    . 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 Right shoulder redo-reconstruction  06/21/10  . Destruction trigeminal nerve via neurolytic agent  x 3 , R side last ~2010  . Cardiac catheterization N/A 11/25/2014    Procedure: Left Heart Cath and Coronary Angiography;  Surgeon: Belva Crome, MD;  Location: [facility];  Service: Cardiovascular;  Laterality: N/A;    Social History   Social History  . Marital Status: Married    Spouse Name: N/A  . Number of Children: 2  . 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 Smoking status: Current Every Day Smoker -- 0.25 packs/day for 18 years    Types: Cigarettes  . Smokeless tobacco: Never Used     Comment: 07/10/13 1/2 ppd  . Alcohol Use: No  . Drug Use: No  . Sexual Activity: Not on file   Other Topics Concern  . Not on file   Social History Narrative   HSG  Culinary school in [place]   Married - '84 - 5 years divorced; remarried '96   1 son - '85               He is employed full-time as Geologist, engineering at [employer]  No Known Allergies  Family History  Problem Relation Age of Onset  . Diabetes Mother   . Hyperlipidemia Mother   . Hypertension Mother   . Hypertension Father   . Heart disease Father     CAD/CHF  . Cancer Father     Mesothelioma  . Heart disease Maternal Aunt   . Prostate cancer Neg Hx   . Colon cancer  Neg Hx     Prior to Admission medications   Medication Sig Start Date End Date Taking? Authorizing Provider  amLODipine (NORVASC) 10 MG tablet Take 1 tablet (10 mg total) by mouth daily. 09/29/15   Colon Branch, MD  aspirin EC 81 MG EC tablet Take 1 tablet (81 mg total) by mouth daily. 09/24/15   Ripudeep Krystal Eaton, MD  atorvastatin (LIPITOR) 20 MG tablet Take 1 tablet (20 mg total) by mouth at bedtime. 09/29/15   Colon Branch, MD  losartan (COZAAR) 50 MG tablet Take 1 tablet (50 mg total) by mouth daily. 09/29/15   Colon Branch, MD  promethazine (PHENERGAN) 25 MG tablet Take 1 tablet (25 mg total) by mouth every 6 (six) hours as needed for nausea or vomiting. 09/24/15   Ripudeep Krystal Eaton, MD  ranitidine (ZANTAC) 300 MG tablet Take 1 tablet (300 mg total) by mouth at bedtime. 09/29/15   Colon Branch, MD  traMADol (ULTRAM) 50 MG tablet Take 1 tablet (50 mg total) by mouth every 8 (eight) hours as needed for moderate pain. 09/24/15   Ripudeep Krystal Eaton, MD    Physical Exam: Filed Vitals:   10/20/15 0105 10/20/15 0251 10/20/15 0501 10/20/15 0558  BP: 100/70 126/80 147/93 145/91  Pulse:  63 71 67  Temp: 98.4 F (36.9 C)  98.3 F (36.8 C) 98.4 F (36.9 C)  TempSrc: Oral  Oral Oral  Resp: 24 32 26 20  Height: 5\' 10"  (1.778 m)     Weight: 92.534 kg (204 lb)     SpO2: 100% 100% 100% 96%     General:  Appears calm, lightly lethargic but not uncomfortable, more unhappy Eyes:  PERRL, EOMI, normal lids, iris ENT:  grossly normal hearing, lips & tongue, close membranes of his mouth are pink but dry Neck:  no LAD, masses or thyromegaly Cardiovascular:  RRR, no m/r/g. No LE edema.  Respiratory:  CTA bilaterally, no w/r/r. Normal respiratory effort. Abdomen:  Soft, non-distended, +BS mild diffuse tenderness to palpation Skin:  no rash or induration seen on limited exam Musculoskeletal:  grossly normal tone BUE/BLE, good ROM, no bony abnormality Psychiatric:  grossly normal mood and affect, speech fluent and appropriate,  AOx3 Neurologic:  CN 2-12 grossly intact, moves all extremities in coordinated fashion, sensation intact  Labs on Admission: I have personally reviewed following labs and imaging studies  CBC:  Recent Labs Lab 10/20/15 0155  WBC 13.7*  NEUTROABS 9.7*  HGB 18.5*  HCT 52.1*  MCV 85.0  PLT 123456   Basic Metabolic Panel:  Recent Labs Lab 10/20/15 0155  NA 136  K 3.6  CL 103  CO2 19*  GLUCOSE 120*  BUN 20  CREATININE 1.40*  CALCIUM 10.5*   GFR: Estimated Creatinine Clearance: 70.5 mL/min (by C-G formula based on  Cr of 1.4). Liver Function Tests:  Recent Labs Lab 10/20/15 0155  AST 21  ALT 18  ALKPHOS 61  BILITOT 1.0  PROT 9.0*  ALBUMIN 5.0   No results for input(s): LIPASE, AMYLASE in the last 168 hours. No results for input(s): AMMONIA in the last 168 hours. Coagulation Profile: No results for input(s): INR, PROTIME in the last 168 hours. Cardiac Enzymes: No results for input(s): CKTOTAL, CKMB, CKMBINDEX, TROPONINI in the last 168 hours. BNP (last 3 results) No results for input(s): PROBNP in the last 8760 hours. HbA1C: No results for input(s): HGBA1C in the last 72 hours. CBG: No results for input(s): GLUCAP in the last 168 hours. Lipid Profile: No results for input(s): CHOL, HDL, LDLCALC, TRIG, CHOLHDL, LDLDIRECT in the last 72 hours. Thyroid Function Tests: No results for input(s): TSH, T4TOTAL, FREET4, T3FREE, THYROIDAB in the last 72 hours. Anemia Panel: No results for input(s): VITAMINB12, FOLATE, FERRITIN, TIBC, IRON, RETICCTPCT in the last 72 hours. Urine analysis:    Component Value Date/Time   COLORURINE YELLOW 09/23/2015 0538   APPEARANCEUR CLEAR 09/23/2015 0538   LABSPEC 1.020 09/23/2015 0538   PHURINE 6.5 09/23/2015 0538   GLUCOSEU NEGATIVE 09/23/2015 0538   GLUCOSEU NEGATIVE 02/04/2014 0830   HGBUR NEGATIVE 09/23/2015 0538   BILIRUBINUR NEGATIVE 09/23/2015 0538   KETONESUR 40* 09/23/2015 0538   PROTEINUR NEGATIVE 09/23/2015 0538    UROBILINOGEN 0.2 02/04/2014 0830   NITRITE NEGATIVE 09/23/2015 0538   LEUKOCYTESUR NEGATIVE 09/23/2015 0538    Creatinine Clearance: Estimated Creatinine Clearance: 70.5 mL/min (by C-G formula based on Cr of 1.4).  Sepsis Labs: @LABRCNTIP (procalcitonin:4,lacticidven:4) ) Recent Results (from the past 240 hour(s))  C difficile quick scan w PCR reflex     Status: None   Collection Time: 10/20/15  2:15 AM  Result Value Ref Range Status   C Diff antigen NEGATIVE NEGATIVE Final   C Diff toxin NEGATIVE NEGATIVE Final   C Diff interpretation Negative for toxigenic C. difficile  Final    Comment: Performed at [place] on Admission: No results found.  EKG: none  Assessment/Plan Principal Problem:   Noninfectious gastroenteritis and colitis Active Problems:   Essential hypertension   GERD   Hypothyroidism   CAD (coronary artery disease)   Vomiting and diarrhea   Acute kidney injury ([place])   1. Noninfectious gastroenteritis and colitis. Patient recently completed Cipro for C. Difficile. EDF PCR negative today. GI panel pending Leukocytosis 13.7 he is afebrile, hemodynamically stable but does appear somewhat acutely ill. -Admit to medical floor -Continue Cipro and Flagyl initiated -IV fluids -Follow GI pathogen panel -Bowel rest -Scheduled Zofran for today low-dose, seen for breakthrough -Analgesia  #2. Acute kidney injury. Creatinine 1.4 on admission. No history of chronic kidney disease -IV fluids as noted above -Hold nephrotoxins -Monitor urine output -Recheck in the morning  #3. Hypertension. Fair control. Home meds include Norvasc, Cozaar -Holding meds for now do to #1 -We'll provide when necessary hydralazine with parameters -Monitor  #4. CAD. Denies chest pain. Cardiac cath 2016 revealed nonobstructive mid and distal RCA plaquing less than 50%, normal LV systolic function with an ejection fraction of 50-55%. Recommended aggressive  risk factor modification -Continue aspirin when able  #5. [name]. Stable at baseline  #6. Hypothyroidism. Not on medication -check tsh    DVT prophylaxis: novenox Code Status: full  Family Communication: none  Disposition Plan: home  Consults called: none  Admission status: obs    [facility] M  MD [facility]  If 7PM-7AM, please contact night-coverage [email or web address] Password [facility]  10/20/2015, 8:19 AM

## 2015-10-20 NOTE — ED Provider Notes (Signed)
CSN: [id number]     Arrival date & time 10/20/15  0057 History   First MD Initiated Contact with Patient 10/20/15 0136     Chief Complaint  Patient presents with  . Vomiting and diarrhea      (Consider location/radiation/quality/duration/timing/severity/associated sxs/prior Treatment) HPI  Patient presents with vomiting and diarrhea. Patient reports 3 day history of "not feeling well." Over the last 24 hours she reports multiple episodes of nonbloody diarrhea and excessive vomiting. He has been unable to keep anything down. He reports crampy abdominal pain that is worse right before he vomits or has diarrhea. Current pain is 8 out of 10. No fevers. He states that his symptoms started after he ate a hamburger. He has a recent history one month ago of Escherichia coli gastroenteritis. He was treated with ciprofloxacin.  He denies any other recent antibiotic use. Denies any recent travel.  Past Medical History  Diagnosis Date  . Hypertension   . Unspecified asthma(493.90)   . Chest pain, atypical   . Nontoxic multinodular goiter   . Dyspnea on exertion   . Trigeminal neuralgia   . Cluster headache   . Hyperlipidemia   . GERD (gastroesophageal reflux disease)   . Complication of anesthesia     difficult waking up"  . Hiatal hernia   . Pulmonary nodule --- CT 10/19/2014: Nodule is stable, no further routine x-rays 01/06/2009  . Hypothyroidism   . TIA (transient ischemic attack)   . BPH (benign prostatic hyperplasia)   . Drug abuse    Past Surgical History  Procedure Laterality Date  . Appendectomy    . Knee arthroscopy    . 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 Right shoulder redo-reconstruction  06/21/10  . Destruction trigeminal nerve via neurolytic agent  x 3 , R side last ~2010  . Cardiac catheterization N/A 11/25/2014    Procedure: Left Heart Cath and Coronary Angiography;  Surgeon: Belva Crome, MD;  Location: [facility];  Service:  Cardiovascular;  Laterality: N/A;   Family History  Problem Relation Age of Onset  . Diabetes Mother   . Hyperlipidemia Mother   . Hypertension Mother   . Hypertension Father   . Heart disease Father     CAD/CHF  . Cancer Father     Mesothelioma  . Heart disease Maternal Aunt   . Prostate cancer Neg Hx   . Colon cancer Neg Hx    Social History  Substance Use Topics  . Smoking status: Current Every Day Smoker -- 0.25 packs/day for 18 years    Types: Cigarettes  . Smokeless tobacco: Never Used     Comment: 07/10/13 1/2 ppd  . Alcohol Use: No    Review of Systems  Constitutional: Negative for fever.  Respiratory: Negative for shortness of breath.   Cardiovascular: Negative for chest pain.  Gastrointestinal: Positive for vomiting, abdominal pain and diarrhea.  All other systems reviewed and are negative.     Allergies  Review of patient's allergies indicates no known allergies.  Home Medications   Prior to Admission medications   Medication Sig Start Date End Date Taking? Authorizing Provider  amLODipine (NORVASC) 10 MG tablet Take 1 tablet (10 mg total) by mouth daily. 09/29/15   Colon Branch, MD  aspirin EC 81 MG EC tablet Take 1 tablet (81 mg total) by mouth daily. 09/24/15   Ripudeep Krystal Eaton, MD  atorvastatin (LIPITOR) 20 MG tablet Take 1  tablet (20 mg total) by mouth at bedtime. 09/29/15   Colon Branch, MD  losartan (COZAAR) 50 MG tablet Take 1 tablet (50 mg total) by mouth daily. 09/29/15   Colon Branch, MD  promethazine (PHENERGAN) 25 MG tablet Take 1 tablet (25 mg total) by mouth every 6 (six) hours as needed for nausea or vomiting. 09/24/15   Ripudeep Krystal Eaton, MD  ranitidine (ZANTAC) 300 MG tablet Take 1 tablet (300 mg total) by mouth at bedtime. 09/29/15   Colon Branch, MD  traMADol (ULTRAM) 50 MG tablet Take 1 tablet (50 mg total) by mouth every 8 (eight) hours as needed for moderate pain. 09/24/15   Ripudeep K Rai, MD   BP 126/80 mmHg  Pulse 63  Temp(Src) 98.4 F (36.9 C) (Oral)   Resp 32  Ht 5\' 10"  (1.778 m)  Wt 204 lb (92.534 kg)  BMI 29.27 kg/m2  SpO2 100% Physical Exam  Constitutional: He is oriented to person, place, and time. No distress.  Ill-appearing but nontoxic  HENT:  Head: Normocephalic and atraumatic.  Mucous membranes dry  Eyes: Pupils are equal, round, and reactive to light.  Cardiovascular: Normal rate, regular rhythm and normal heart sounds.   No murmur heard. Pulmonary/Chest: Effort normal and breath sounds normal. No respiratory distress. He has no wheezes.  Abdominal: Soft. Bowel sounds are normal. There is no tenderness. There is no rebound and no guarding.  Musculoskeletal: He exhibits no edema.  Neurological: He is alert and oriented to person, place, and time.  Skin: Skin is warm. He is diaphoretic.  Psychiatric: He has a normal mood and affect.  Nursing note and vitals reviewed.   ED Course  Procedures (including critical care time) Labs Review Labs Reviewed  CBC WITH DIFFERENTIAL/PLATELET - Abnormal; Notable for the following:    WBC 13.7 (*)    RBC 6.13 (*)    Hemoglobin 18.5 (*)    HCT 52.1 (*)    Neutro Abs 9.7 (*)    All other components within normal limits  COMPREHENSIVE METABOLIC PANEL - Abnormal; Notable for the following:    CO2 19 (*)    Glucose, Bld 120 (*)    Creatinine, Ser 1.40 (*)    Calcium 10.5 (*)    Total Protein 9.0 (*)    GFR calc non Af Amer 56 (*)    All other components within normal limits  C DIFFICILE QUICK SCREEN W PCR REFLEX  GASTROINTESTINAL PANEL BY PCR, STOOL (REPLACES STOOL CULTURE)  URINALYSIS, ROUTINE W REFLEX MICROSCOPIC (NOT AT [facility])    Imaging Review No results found. I have personally reviewed and evaluated these images and lab results as part of my medical decision-making.   EKG Interpretation None      MDM   Final diagnoses:  Nausea vomiting and diarrhea  Dehydration    Patient presents with vomiting and diarrhea. He is ill-appearing but nontoxic on exam. Vital  signs reassuring. He is actively dry heaving and vomiting. He has had multiple episodes of diarrhea while in the emergency department. Patient was given fluids and nausea medication. Lab work notable for what appears to be hemoconcentration with a hemoglobin of 18.5. White count 13.7. He has evidence of acute kidney injury with a creatinine of 1.4. Otherwise his LFTs and lab work are reassuring. Repeat stool studies were sent including C. difficile and a gastrointestinal panel. Patient required multiple doses of anti-medic including Zofran, Phenergan, and lastly Haldol with minimal improvement of his vomiting. He is  unable to tolerate fluids. Given recent documented infection, patient was given Cipro and Flagyl to cover for bacterial infection. Will admit to the hospital for hydration and further management.  After history, exam, and medical workup I feel the patient has been appropriately medically screened and is safe for discharge home. Pertinent diagnoses were discussed with the patient. Patient was given return precautions.     Merryl Hacker, MD 10/20/15 [phone]

## 2015-10-20 NOTE — ED Notes (Signed)
Vomiting and diarrhea x2 days.  Diagnosed with E.Coli last month.

## 2015-10-20 NOTE — Progress Notes (Signed)
Accepted to med-surg bed at [facility] under observation status from [facility] with nausea, vomitng, diarrhea x2 days. Had infectious diarrhea last month with E coli on PCR. Recovered from that with abx until this episode. Has leukocytosis and AKI, no fever, no tachycardia, no hypotension. Was bolused with 2 L NS and given cipro and Flagyl. Vomiting could not be controlled with Phenergan, Zofran, or Haldol. Dehydrated and not tolerating PO.

## 2015-10-20 NOTE — Progress Notes (Signed)
Admitted to 5M[facility] via [place].  VSS continues N/V & diarrhea.  Oriented to room, safety precautions.  BSC beside bed for easy access.  MD paged of arrival.

## 2015-10-20 NOTE — ED Notes (Signed)
Cipro 400 mg IV infusing site and drsg WNL Flagyl 500 mg at bedside waiting to be infused after Cipro completed. Will send with pt if no infusing prior to departure to [facility].

## 2015-10-20 NOTE — Progress Notes (Signed)
Compazine given, pt attempted to drink oral contrast, pt unable to tolerate. MD/NP paged. CT depatment notified.   Ave Filter, RN

## 2015-10-20 NOTE — ED Notes (Signed)
Phone report given to [facility] 

## 2015-10-20 NOTE — ED Notes (Signed)
MD at bedside. 

## 2015-10-20 NOTE — Progress Notes (Signed)
Patient is unable to tolerate oral contrast at this time. Per NP (K. Black) to administer compazine and have pt attempt to take this contrast for his Abdomen CT. Will continue to monitor.   Ave Filter, RN

## 2015-10-21 DIAGNOSIS — I10 Essential (primary) hypertension: Secondary | ICD-10-CM | POA: Diagnosis not present

## 2015-10-21 DIAGNOSIS — K529 Noninfective gastroenteritis and colitis, unspecified: Secondary | ICD-10-CM

## 2015-10-21 DIAGNOSIS — E440 Moderate protein-calorie malnutrition: Secondary | ICD-10-CM | POA: Diagnosis not present

## 2015-10-21 DIAGNOSIS — N179 Acute kidney failure, unspecified: Secondary | ICD-10-CM

## 2015-10-21 DIAGNOSIS — K5289 Other specified noninfective gastroenteritis and colitis: Secondary | ICD-10-CM | POA: Diagnosis not present

## 2015-10-21 LAB — URINALYSIS, ROUTINE W REFLEX MICROSCOPIC
Bilirubin Urine: NEGATIVE
Glucose, UA: NEGATIVE mg/dL
Hgb urine dipstick: NEGATIVE
Ketones, ur: 15 mg/dL — AB
Leukocytes, UA: NEGATIVE
Nitrite: NEGATIVE
Protein, ur: NEGATIVE mg/dL
Specific Gravity, Urine: 1.026 (ref 1.005–1.030)
pH: 6 (ref 5.0–8.0)

## 2015-10-21 LAB — BASIC METABOLIC PANEL
Anion gap: 9 (ref 5–15)
BUN: 14 mg/dL (ref 6–20)
CO2: 21 mmol/L — ABNORMAL LOW (ref 22–32)
Calcium: 9.3 mg/dL (ref 8.9–10.3)
Chloride: 108 mmol/L (ref 101–111)
Creatinine, Ser: 1.15 mg/dL (ref 0.61–1.24)
GFR calc Af Amer: >60 mL/min (ref >60)
GFR calc non Af Amer: >60 mL/min (ref >60)
Glucose, Bld: 121 mg/dL — ABNORMAL HIGH (ref 65–99)
Potassium: 3.6 mmol/L (ref 3.5–5.1)
Sodium: 138 mmol/L (ref 135–145)

## 2015-10-21 LAB — CBC
HCT: 47.2 % (ref 39.0–52.0)
Hemoglobin: 16.5 g/dL (ref 13.0–17.0)
MCH: 30.7 pg (ref 26.0–34.0)
MCHC: 35 g/dL (ref 30.0–36.0)
MCV: 87.7 fL (ref 78.0–100.0)
Platelets: 180 10*3/uL (ref 150–400)
RBC: 5.38 MIL/uL (ref 4.22–5.81)
RDW: 13.6 % (ref 11.5–15.5)
WBC: 15.7 10*3/uL — ABNORMAL HIGH (ref 4.0–10.5)

## 2015-10-21 LAB — RAPID URINE DRUG SCREEN, HOSP PERFORMED
Amphetamines: NOT DETECTED
Barbiturates: NOT DETECTED
Benzodiazepines: NOT DETECTED
Cocaine: NOT DETECTED
Opiates: POSITIVE — AB
Tetrahydrocannabinol: NOT DETECTED

## 2015-10-21 LAB — T4, FREE: Free T4: 1.01 ng/dL (ref 0.61–1.12)

## 2015-10-21 MED ORDER — AMLODIPINE BESYLATE 10 MG PO TABS
10.0000 mg | ORAL_TABLET | Freq: Every day | ORAL | Status: DC
  Administered 2015-10-21 – 2015-10-24 (×4): 10 mg via ORAL
  Filled 2015-10-21 (×4): qty 1

## 2015-10-21 MED ORDER — LOSARTAN POTASSIUM 50 MG PO TABS
50.0000 mg | ORAL_TABLET | Freq: Every day | ORAL | Status: DC
  Administered 2015-10-21 – 2015-10-24 (×4): 50 mg via ORAL
  Filled 2015-10-21 (×4): qty 1

## 2015-10-21 MED ORDER — LOPERAMIDE HCL 2 MG PO CAPS
2.0000 mg | ORAL_CAPSULE | ORAL | Status: DC | PRN
  Administered 2015-10-21 – 2015-10-22 (×2): 2 mg via ORAL
  Filled 2015-10-21 (×2): qty 1

## 2015-10-21 MED ORDER — TRAZODONE HCL 50 MG PO TABS
50.0000 mg | ORAL_TABLET | Freq: Every evening | ORAL | Status: DC | PRN
  Administered 2015-10-21 – 2015-10-23 (×3): 50 mg via ORAL
  Filled 2015-10-21 (×3): qty 1

## 2015-10-21 MED ORDER — BOOST / RESOURCE BREEZE PO LIQD
1.0000 | Freq: Three times a day (TID) | ORAL | Status: DC
  Administered 2015-10-22 (×2): 1 via ORAL
  Filled 2015-10-21 (×12): qty 1

## 2015-10-21 NOTE — Progress Notes (Addendum)
PROGRESS NOTE    [name]  [id number] DOB: 02-Sep-1960 DOA: 10/20/2015 PCP: Kathlene November, MD  Outpatient Specialists:     Brief Narrative:   HPI: [name] is a 55 y.o. male with medical history significant for hypertension, asthma, GERD, hypothyroidism, TIA, BPH, drug abuse and recent diagnosis of C. difficile presents to room 3 on 28M at [facility] from [facility] with the chief complaint of persistent nausea vomiting and diarrhea. Initial evaluation reveals acute kidney injury and is concerning for C. Difficile/colitis  Formation is obtained from the patient. He reports one month ago he was diagnosed with Escherichia coli gastroenteritis and treated with ciprofloxacin. He reports compliant with antibiotics and completed the treatment. He states yesterday hamburger and his symptoms began shortly thereafter. Describes the pain as abdomen cramp-like located throughout the abdomen. Eating or drinking makes it worse and nothing makes it better. He reports having 6-10 episodes of emesis in the last 24 hours. He denies coffee ground emesis. In addition he's had "several" episodes of watery stool. He denies dark stool or bright red blood per rectum. Associated symptoms include headache dizziness subjective fever and chills. He denies chest pain palpitation shortness of breath cough dysuria hematuria frequency or urgency.    Assessment & Plan:   Principal Problem:   Noninfectious gastroenteritis and colitis Active Problems:   Essential hypertension   GERD   Hypothyroidism   CAD (coronary artery disease)   Vomiting and diarrhea   Acute kidney injury ([place])   Dehydration   Malnutrition of moderate degree  1. Noninfectious gastroenteritis and colitis. Patient recently completed Cipro for C. Difficile. EDF PCR negative today. GI panel pending Leukocytosis 13.7 he is afebrile, hemodynamically stable and improving. -Continue Cipro and Flagyl initiated -IV fluids -Follow GI  pathogen panel - C. Diff negative -Will advance diet to clears and add Boost Breeze po TID as per nutrition -Scheduled Zofran for today low-dose, seen for breakthrough -Analgesia, requesting medication for sleep -Unable to tolerate PO contrast, only IV  #2. Acute kidney injury. Creatinine 1.4 on admission. No history of chronic kidney disease -IV fluids as noted above -Resolved, now creatinine 1.15  #3. Hypertension. Home meds include Norvasc, Cozaar -Will resume home meds based on improvement, BPs 160s/90s -Continue hydralazine IV prn  #4. CAD. Denies chest pain. Cardiac cath 2016 revealed nonobstructive mid and distal RCA plaquing less than 50%, normal LV systolic function with an ejection fraction of 50-55%. Recommended aggressive risk factor modification -Continue aspirin when able  #5. [name]. Stable at baseline  #6. Hypothyroidism. Not on medication -TSH low at 0.214 which does not go along with uncontrolled hypothyroidism.  Will check free T4.  #7.  Moderate malnutrition in the setting of acute illness/injury. As per nutrition: Add Boost Breeze po TID if advanced to clear liquids, each supplement provides 250 kcal and 9 grams of protein Add Ensure Enlive po BID when advanced to full liquids or regular diet, each supplement provides 350 kcal and 20 grams of protein   DVT prophylaxis: Lovenox Code Status: full  Family Communication: none  Disposition Plan: home  Consults called: none  Admission status: obs    Subjective: Patient reports improvement in pain, stools, n/v but still with ongoing symptoms this AM.  Requesting Imodium for loose stools.  Objective: Filed Vitals:   10/21/15 0149 10/21/15 0700 10/21/15 0928 10/21/15 1530  BP: 158/94 162/85 166/90 166/92  Pulse: 64 57 64 57  Temp: 98.4 F (36.9 C) 98.2 F (36.8 C) 98.2 F (  36.8 C) 98.2 F (36.8 C)  TempSrc: Oral Oral Oral Oral  Resp: 18 18 20 20   Height:      Weight:      SpO2: 96% 97% 96% 98%     Intake/Output Summary (Last 24 hours) at 10/21/15 1710 Last data filed at 10/21/15 0930  Gross per 24 hour  Intake 445.83 ml  Output    950 ml  Net -504.17 ml   Filed Weights   10/20/15 0105  Weight: 92.534 kg (204 lb)    Examination:  General exam: Appears calm and comfortable  Respiratory system: Clear to auscultation. Respiratory effort normal. Cardiovascular system: S1 & S2 heard, RRR. No JVD, murmurs, rubs, gallops or clicks. No pedal edema. Gastrointestinal system: Abdomen is nondistended, soft and nontender. No organomegaly or masses felt. Normal bowel sounds heard. Central nervous system: Alert and oriented. No focal neurological deficits. Extremities: Symmetric 5 x 5 power. Skin: No rashes, lesions or ulcers Psychiatry: Judgement and insight appear normal. Mood & affect appropriate.     Data Reviewed: I have personally reviewed following labs and imaging studies  CBC:  Recent Labs Lab 10/20/15 0155 10/21/15 0012  WBC 13.7* 15.7*  NEUTROABS 9.7*  --   HGB 18.5* 16.5  HCT 52.1* 47.2  MCV 85.0 87.7  PLT 211 [id number]   Basic Metabolic Panel:  Recent Labs Lab 10/20/15 0155 10/21/15 0012  NA 136 138  K 3.6 3.6  CL 103 108  CO2 19* 21*  GLUCOSE 120* 121*  BUN 20 14  CREATININE 1.40* 1.15  CALCIUM 10.5* 9.3   GFR: Estimated Creatinine Clearance: 85.9 mL/min (by C-G formula based on Cr of 1.15). Liver Function Tests:  Recent Labs Lab 10/20/15 0155  AST 21  ALT 18  ALKPHOS 61  BILITOT 1.0  PROT 9.0*  ALBUMIN 5.0   No results for input(s): LIPASE, AMYLASE in the last 168 hours. No results for input(s): AMMONIA in the last 168 hours. Coagulation Profile: No results for input(s): INR, PROTIME in the last 168 hours. Cardiac Enzymes: No results for input(s): CKTOTAL, CKMB, CKMBINDEX, TROPONINI in the last 168 hours. BNP (last 3 results) No results for input(s): PROBNP in the last 8760 hours. HbA1C: No results for input(s): HGBA1C in the last 72  hours. CBG: No results for input(s): GLUCAP in the last 168 hours. Lipid Profile: No results for input(s): CHOL, HDL, LDLCALC, TRIG, CHOLHDL, LDLDIRECT in the last 72 hours. Thyroid Function Tests:  Recent Labs  10/20/15 0936  TSH 0.214*   Anemia Panel: No results for input(s): VITAMINB12, FOLATE, FERRITIN, TIBC, IRON, RETICCTPCT in the last 72 hours. Urine analysis:    Component Value Date/Time   COLORURINE YELLOW 10/21/2015 0610   APPEARANCEUR CLEAR 10/21/2015 0610   LABSPEC 1.026 10/21/2015 0610   PHURINE 6.0 10/21/2015 0610   GLUCOSEU NEGATIVE 10/21/2015 0610   GLUCOSEU NEGATIVE 02/04/2014 0830   HGBUR NEGATIVE 10/21/2015 0610   BILIRUBINUR NEGATIVE 10/21/2015 0610   KETONESUR 15* 10/21/2015 0610   PROTEINUR NEGATIVE 10/21/2015 0610   UROBILINOGEN 0.2 02/04/2014 0830   NITRITE NEGATIVE 10/21/2015 0610   LEUKOCYTESUR NEGATIVE 10/21/2015 0610   Sepsis Labs: @LABRCNTIP (procalcitonin:4,lacticidven:4)  ) Recent Results (from the past 240 hour(s))  C difficile quick scan w PCR reflex     Status: None   Collection Time: 10/20/15  2:15 AM  Result Value Ref Range Status   C Diff antigen NEGATIVE NEGATIVE Final   C Diff toxin NEGATIVE NEGATIVE Final   C Diff interpretation Negative for  toxigenic C. difficile  Final    Comment: Performed at [facility]         Radiology Studies: Ct Abdomen Pelvis W Contrast  10/20/2015  CLINICAL DATA:  Diffuse abdominal pain with nausea, vomiting, and diarrhea. EXAM: CT ABDOMEN AND PELVIS WITH CONTRAST TECHNIQUE: Multidetector CT imaging of the abdomen and pelvis was performed using the standard protocol following bolus administration of intravenous contrast. CONTRAST:  15mL ISOVUE-300 IOPAMIDOL (ISOVUE-300) INJECTION 61% COMPARISON:  09/22/2015 FINDINGS: Lower chest:  Unremarkable. Hepatobiliary: No focal abnormality within the liver parenchyma. There is no evidence for gallstones, gallbladder wall thickening, or pericholecystic  fluid. No intrahepatic or extrahepatic biliary dilation. Pancreas: No focal mass lesion. No dilatation of the main duct. No intraparenchymal cyst. No peripancreatic edema. Spleen: Tiny hypodensity in the spleen is stable and likely represents a cyst or pseudocyst. Adrenals/Urinary Tract: No adrenal nodule or mass. Right kidney is unremarkable. Again noted in the interpolar region of the left kidney is a 1.9 x 1.7 cm lesion with heterogeneous attenuation and potential enhancement. Also in the interpolar left kidney is a second lesion measuring 2.4 cm. This lesion has homogeneous attenuation and measures water density, compatible with a simple cyst. No evidence for hydroureter. Despite the lack of hydroureter, the patient has an 8 x 5 mm stone in the left UVJ. This stone was seen in the left kidney on an exam from 12/07/2011. Stomach/Bowel: Stomach is nondistended. No gastric wall thickening. No evidence of outlet obstruction. Duodenum is normally positioned as is the ligament of Treitz. No small bowel wall thickening. No small bowel dilatation. The terminal ileum is normal. No gross colonic mass. No colonic wall thickening. No substantial diverticular change. Vascular/Lymphatic: There is abdominal aortic atherosclerosis without aneurysm. There is no gastrohepatic or hepatoduodenal ligament lymphadenopathy. No intraperitoneal or retroperitoneal lymphadenopathy. No pelvic sidewall lymphadenopathy. Reproductive: The prostate gland and seminal vesicles have normal imaging features. Other: No intraperitoneal free fluid. Musculoskeletal: Small umbilical hernia contains only fat. 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 8 x 5 mm stone in the region of the left UVJ is probably in the left UVJ. The patient had a stone of similar size in the left kidney on 12/07/2011 which was not present in the kidney on 09/22/2015, suggesting interval migration between those 2 studies. It is  somewhat unusual that there is no left hydroureteronephrosis given the size of this stone and stone projects essentially in the bladder wall but on sagittal reformations does appear to be just into the lumen of the bladder. Given the relative stability in this appearance since 09/22/2015, this is not an acute finding on today's study. There is no abnormal perfusion in the left kidney to suggest obstructive uropathy. 2. Otherwise no acute findings on today's exam. 3. Abdominal aortic atherosclerosis. Electronically Signed   By: Misty Stanley M.D.   On: 10/20/2015 19:56        Scheduled Meds: . ciprofloxacin  400 mg Intravenous Q12H  . enoxaparin (LOVENOX) injection  40 mg Subcutaneous Q24H  . metronidazole  500 mg Intravenous Q8H   Continuous Infusions:       Time spent: 25    Karmen Bongo, MD [facility]   If 7PM-7AM, please contact night-coverage [email or web address] Password [email or web address] 10/21/2015, 5:10 PM

## 2015-10-21 NOTE — Progress Notes (Signed)
Initial Nutrition Assessment  DOCUMENTATION CODES:   Non-severe (moderate) malnutrition in context of acute illness/injury  INTERVENTION:  Diet advancement per MD Add Boost Breeze po TID if advanced to clear liquids, each supplement provides 250 kcal and 9 grams of protein Add Ensure Enlive po BID when advanced to full liquids or regular diet, each supplement provides 350 kcal and 20 grams of protein   NUTRITION DIAGNOSIS:   Malnutrition related to acute illness as evidenced by mild depletion of body fat, mild depletion of muscle mass.   GOAL:   Patient will meet greater than or equal to 90% of their needs   MONITOR:   Diet advancement, Supplement acceptance, PO intake, Weight trends, Labs, I & O's  REASON FOR ASSESSMENT:   Malnutrition Screening Tool    ASSESSMENT:   55 y.o. male with medical history significant for hypertension, asthma, GERD, hypothyroidism, TIA, BPH, drug abuse and recent diagnosis of C. difficile presents to room 3 on 10M at [place] from [facility] with the chief complaint of persistent nausea vomiting and diarrhea.  Pt reports that he has been unable to eat since Monday due to nausea, vomiting, abdominal pain and diarrhea. He reports tolerating some yogurt during the past few days. He is NPO at time of visit, still feeling unwell. He reports having similar episodes x 1 week earlier this month. Weight history shows pt has lost 2 lbs in the past 3 weeks, 22 lbs in the past year. Pt has moderate muscle wasting and mild fat wasting per nutrition-focused physical exam. 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 He is agreeable to nutritional supplements until he is able to tolerate solid food again.   Labs and medications reviewed.   Diet Order:  Diet NPO time specified Except for: Sips with Meds  Skin:  Reviewed, no issues  Last BM:  6/28  Height:   Ht Readings from Last 1 Encounters:  10/20/15 5\' 10"  (1.778 m)    Weight:   Wt  Readings from Last 1 Encounters:  10/20/15 204 lb (92.534 kg)    Ideal Body Weight:  75.5 kg  BMI:  Body mass index is 29.27 kg/(m^2).  Estimated Nutritional Needs:   Kcal:  [id number]  Protein:  80-90 grams  Fluid:  2.3-2.5 L/day  EDUCATION NEEDS:   No education needs identified at this time  [place], LDN Inpatient Clinical Dietitian Pager: [phone] After Hours Pager: [phone]

## 2015-10-21 NOTE — Discharge Summary (Signed)
Physician Discharge Summary  Benjamen Napieralski [id number] DOB: 10-May-1960 DOA: 10/20/2015  PCP: Kathlene November, MD  Admit date: 10/20/2015 Discharge date: 10/24/2015  Time spent: 40 minutes  Recommendations for Outpatient Follow-up:  Patient will be discharged to home.  Patient will need to follow up with primary care provider within one week of discharge.  Patient should continue medications as prescribed.  Patient should follow a bland diet and advance as tolerated.    Discharge Diagnoses:  Principal Problem:   Noninfectious gastroenteritis and colitis Active Problems:   Essential hypertension   GERD   Hypothyroidism   CAD (coronary artery disease)   Vomiting and diarrhea   Acute kidney injury ([place])   Dehydration   Malnutrition of moderate degree   Discharge Condition: Improved  Diet recommendation: Bland diet, advance as tolerated  Filed Weights   10/20/15 0105  Weight: 92.534 kg (204 lb)    History of present illness:  HPI: [name] is a 55 y.o. male with medical history significant for hypertension, asthma, GERD, hypothyroidism, TIA, BPH, drug abuse and recent diagnosis of C. difficile presents to room 3 on 1M at [facility] from [facility] with the chief complaint of persistent nausea vomiting and diarrhea. Initial evaluation reveals acute kidney injury and is concerning for C. Difficile/colitis  Formation is obtained from the patient. He reports one month ago he was diagnosed with Escherichia coli gastroenteritis and treated with ciprofloxacin. He reports compliant with antibiotics and completed the treatment. He states yesterday hamburger and his symptoms began shortly thereafter. Describes the pain as abdomen cramp-like located throughout the abdomen. Eating or drinking makes it worse and nothing makes it better. He reports having 6-10 episodes of emesis in the last 24 hours. He denies coffee ground emesis. In addition he's had "several" episodes of watery  stool. He denies dark stool or bright red blood per rectum. Associated symptoms include headache dizziness subjective fever and chills. He denies chest pain palpitation shortness of breath cough dysuria hematuria frequency or urgency.   Hospital Course:  1. Noninfectious gastroenteritis and colitis. - Admitted 5/31-6/2 with similar symptoms and at that time positive for Enteropathic E. Coli and treated with Cipro. Also had C. Diff antigen positive but toxin negative. - On current admission, continues to have C. Diff antigen positive and toxin negative but GI path panel is negative. - CT with IV contrast only, unable to tolerate PO contrast - Was on Cipro/Flagyl since readmission on 6/28 through 7/1; both discontinued per GI consult. - Leukocytosis is now resolved and patient is afebrile. - IV fluids stopped and labs continued to normalize -Attempted to advance diet as tolerated and add Boost Breeze po TID as per nutrition; patient with persistent n/v and fecal urgency immediately upon eating/drinking which improved with addition of PO Vanc - Ongoing need for anti-emetics, will transition to PO - GI consulted, Dr. Benson Norway - he suggests Vanc 125 PO q6h x 14 days for possible refractory C. Diff colitis. He also recommended starting Florastor and Pepcid rather than Prevacid. - Will continue these medications on an outpatient basis with discharge to home today.  #2. Acute kidney injury. Creatinine 1.4 on admission. No history of chronic kidney disease -IV fluids provided - Creatinine 1.11 at the time of discharge, which appears to be at/near baseline for the patient.  #3. Hypertension. Home meds include Norvasc, Cozaar -Home meds resumed 6/29 pm based on improvement, BPs improved since initiation  #4. CAD. Denies chest pain. Cardiac cath 2016 revealed nonobstructive mid  and distal RCA plaquing less than 50%, normal LV systolic function with an ejection fraction of 50-55%. Recommended aggressive risk  factor modification -Continue home aspirin  #5. [name]. Stable at baseline -Continue to hold Prevacid at discharge due to risk associated with PPI and C. Diff; continue Pepcid for now  #6. Hypothyroidism. Not on medication -TSH low at 0.214 which does not go along with uncontrolled hypothyroidism. Free T4 1.01 (normal). Suggest recheck in 4-6 weeks.  #7. Moderate malnutrition in the setting of acute illness/injury. - Continue Ensure Enlive po BID    Procedures:  None  Consultations:  GI  Discharge Exam: Filed Vitals:   10/24/15 0201 10/24/15 1002  BP: 95/70 94/62  Pulse: 76 61  Temp: 97.9 F (36.6 C) 98.1 F (36.7 C)  Resp: 16 20     General: Well developed, well nourished, NAD, appears stated age  HEENT: NCAT, PERRLA, EOMI, Anicteic Sclera, mucous membranes moist.  Neck: Supple, no JVD, no masses  Cardiovascular: S1 S2 auscultated, no rubs, murmurs or gallops. Regular rate and rhythm.  Respiratory: Clear to auscultation bilaterally with equal chest rise  Abdomen: Soft, minimally diffusely tender, nondistended, + bowel sounds  Extremities: warm dry without cyanosis clubbing or edema  Neuro: AAOx3, cranial nerves grossly intact. Strength 5/5 in patient's upper and lower extremities bilaterally  Skin: Without rashes exudates or nodules  Psych: Normal affect and demeanor with intact judgement and insight  Discharge Instructions  You have been hospitalized for the following problems:  1. Gastroenteritis - while the C. Difficile testing was negative, it is possible that you do have this infection and so ongoing antibiotic treatment is reasonable.   -Continue to slowly advance your diet.  Start with clear liquids, then full liquids, then bland foods (bananas, apples, rice, toast), and eventually resume a regular diet as tolerated. -Continue to drink Ensure Enlive twice daily until you can resume a regular diet. -Continue to take oral medicines for  nausea/vomiting/diarrhea as needed. -Take oral antibiotics (Vancomycin) twice daily for a total of 14 days. -Continue to take probiotics (Florastor). -Do not take Prevacid for now and instead take Pepcid daily for reflux.  2.  Acute kidney injury - this was present on admission and has resolved.  3.  Hypertension - Continue your home medications.  4. Hypothyroidism - Your TSH was abnormal but the other thyroid test was normal.  No changes were made to your medications.  Have your doctor recheck your thyroid function in 4-6 weeks.     Discharge Instructions    Call MD for:  persistant nausea and vomiting    Complete by:  As directed      Call MD for:  severe uncontrolled pain    Complete by:  As directed      Call MD for:  temperature >100.4    Complete by:  As directed      Diet - low sodium heart healthy    Complete by:  As directed      Discharge instructions    Complete by:  As directed   You have been hospitalized for the following problems:  1. Gastroenteritis - while the C. Difficile testing was negative, it is possible that you do have this infection and so ongoing antibiotic treatment is reasonable.   -Continue to slowly advance your diet.  Start with clear liquids, then full liquids, then bland foods (bananas, apples, rice, toast), and eventually resume a regular diet as tolerated. -Continue to drink Ensure Enlive twice daily until  you can resume a regular diet. -Continue to take oral medicines for nausea/vomiting/diarrhea as needed. -Take oral antibiotics (Vancomycin) twice daily for a total of 14 days. -Continue to take probiotics (Florastor). -Do not take Prevacid for now and instead take Pepcid daily for reflux.  2.  Acute kidney injury - this was present on admission and has resolved.  3.  Hypertension - Continue your home medications.  4. Hypothyroidism - Your TSH was abnormal but the other thyroid test was normal.  No changes were made to your medications.  Have  your doctor recheck your thyroid function in 4-6 weeks.     Increase activity slowly    Complete by:  As directed             Medication List    STOP taking these medications        ciprofloxacin 500 MG tablet  Commonly known as:  CIPRO     ranitidine 300 MG tablet  Commonly known as:  ZANTAC      TAKE these medications        amLODipine 10 MG tablet  Commonly known as:  NORVASC  Take 1 tablet (10 mg total) by mouth daily.     aspirin 81 MG EC tablet  Take 1 tablet (81 mg total) by mouth daily.     atorvastatin 20 MG tablet  Commonly known as:  LIPITOR  Take 1 tablet (20 mg total) by mouth at bedtime.     famotidine 40 MG tablet  Commonly known as:  PEPCID  Take 1 tablet (40 mg total) by mouth daily.     feeding supplement (ENSURE ENLIVE) Liqd  Take 237 mLs by mouth 2 (two) times daily between meals.     losartan 50 MG tablet  Commonly known as:  COZAAR  Take 1 tablet (50 mg total) by mouth daily.     promethazine 25 MG tablet  Commonly known as:  PHENERGAN  Take 1 tablet (25 mg total) by mouth every 6 (six) hours as needed for nausea or vomiting.     saccharomyces boulardii 250 MG capsule  Commonly known as:  FLORASTOR  Take 1 capsule (250 mg total) by mouth 2 (two) times daily.     vancomycin 125 MG capsule  Commonly known as:  VANCOCIN  Take 1 capsule (125 mg total) by mouth 2 (two) times daily.       No Known Allergies Follow-up Information    Follow up with Kathlene November, MD In 1 week.   Specialty:  Internal Medicine   Why:  hospital follow up   Contact information:   [place] [id number] [phone]        The results of significant diagnostics from this hospitalization (including imaging, microbiology, ancillary and laboratory) are listed below for reference.    Significant Diagnostic Studies: US Soft Tissue Head/neck  10/01/2015  CLINICAL DATA:  Goiter EXAM: THYROID ULTRASOUND TECHNIQUE: Ultrasound examination  of the thyroid gland and adjacent soft tissues was performed. COMPARISON:  08/17/2009 FINDINGS: Right thyroid lobe Measurements: 5.7 x 2.3 x 2.5 cm. Solid isoechoic nodule in the lower pole measures 1.7 x 1.4 x 1.6 cm. Previously, this measured 1.6 x 1.2 x 1.1 cm. 0.6 cm mid lobe nodule. Left thyroid lobe Measurements: 7.8 x 3.8 x 3.5 cm. Lower pole isoechoic solid nodule measures 3.0 x 3.0 x 2.5 cm. Mid lobe nodule measures 1.7 x 1.8 x 1.7 cm. Previously, the largest nodule on the  left measured 2.0 cm. Isthmus Thickness: 0.8 cm.  No nodules visualized. Lymphadenopathy None visualized. IMPRESSION: Multiple bilateral nodules are again noted. The dominant nodule in the left lower pole has enlarged common now measuring 3.0 cm. Findings meet consensus criteria for biopsy. Ultrasound-guided fine needle aspiration should be considered, as per the consensus statement: Management of Thyroid Nodules Detected at [place]: Society of Radiologists in [place]. Radiology 2005; [id number]. Electronically Signed   By: Marybelle Killings M.D.   On: 10/01/2015 14:37   Ct Abdomen Pelvis W Contrast  10/20/2015  CLINICAL DATA:  Diffuse abdominal pain with nausea, vomiting, and diarrhea. EXAM: CT ABDOMEN AND PELVIS WITH CONTRAST TECHNIQUE: Multidetector CT imaging of the abdomen and pelvis was performed using the standard protocol following bolus administration of intravenous contrast. CONTRAST:  16mL ISOVUE-300 IOPAMIDOL (ISOVUE-300) INJECTION 61% COMPARISON:  09/22/2015 FINDINGS: Lower chest:  Unremarkable. Hepatobiliary: No focal abnormality within the liver parenchyma. There is no evidence for gallstones, gallbladder wall thickening, or pericholecystic fluid. No intrahepatic or extrahepatic biliary dilation. Pancreas: No focal mass lesion. No dilatation of the main duct. No intraparenchymal cyst. No peripancreatic edema. Spleen: Tiny hypodensity in the spleen is stable and likely represents a cyst or  pseudocyst. Adrenals/Urinary Tract: No adrenal nodule or mass. Right kidney is unremarkable. Again noted in the interpolar region of the left kidney is a 1.9 x 1.7 cm lesion with heterogeneous attenuation and potential enhancement. Also in the interpolar left kidney is a second lesion measuring 2.4 cm. This lesion has homogeneous attenuation and measures water density, compatible with a simple cyst. No evidence for hydroureter. Despite the lack of hydroureter, the patient has an 8 x 5 mm stone in the left UVJ. This stone was seen in the left kidney on an exam from 12/07/2011. Stomach/Bowel: Stomach is nondistended. No gastric wall thickening. No evidence of outlet obstruction. Duodenum is normally positioned as is the ligament of Treitz. No small bowel wall thickening. No small bowel dilatation. The terminal ileum is normal. No gross colonic mass. No colonic wall thickening. No substantial diverticular change. Vascular/Lymphatic: There is abdominal aortic atherosclerosis without aneurysm. There is no gastrohepatic or hepatoduodenal ligament lymphadenopathy. No intraperitoneal or retroperitoneal lymphadenopathy. No pelvic sidewall lymphadenopathy. Reproductive: The prostate gland and seminal vesicles have normal imaging features. Other: No intraperitoneal free fluid. Musculoskeletal: Small umbilical hernia contains only fat. 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 8 x 5 mm stone in the region of the left UVJ is probably in the left UVJ. The patient had a stone of similar size in the left kidney on 12/07/2011 which was not present in the kidney on 09/22/2015, suggesting interval migration between those 2 studies. It is somewhat unusual that there is no left hydroureteronephrosis given the size of this stone and stone projects essentially in the bladder wall but on sagittal reformations does appear to be just into the lumen of the bladder. Given the relative stability in  this appearance since 09/22/2015, this is not an acute finding on today's study. There is no abnormal perfusion in the left kidney to suggest obstructive uropathy. 2. Otherwise no acute findings on today's exam. 3. Abdominal aortic atherosclerosis. Electronically Signed   By: Misty Stanley M.D.   On: 10/20/2015 19:56   US Thyroid Biopsy  10/06/2015  INDICATION: 55 year old male with enlarging left lower thyroid nodule. EXAM: ULTRASOUND GUIDED NEEDLE ASPIRATE BIOPSY OF THE THYROID GLAND COMPARISON:  Prior thyroid ultrasound 10/01/2015 MEDICATIONS: 1% lidocaine COMPLICATIONS: None  immediate. PROCEDURE: Informed written consent was obtained from the patient after a thorough discussion of the procedural risks, benefits and alternatives. All questions were addressed. Maximal Sterile Barrier Technique was utilized including caps, mask, sterile gowns, sterile gloves, sterile drape, hand hygiene and skin antiseptic. A timeout was performed prior to the initiation of the procedure. Ultrasound was performed to localize and mark an adequate site for the biopsy. The patient was then prepped and draped in a normal sterile fashion. Local anesthesia was provided with 1% lidocaine. Using direct ultrasound guidance, 4 passes were made using needles into the nodule within the left lobe of the thyroid. Ultrasound was used to confirm needle placements on all occasions. Specimens were sent to Pathology for analysis. IMPRESSION: Ultrasound guided needle aspirate biopsy performed of the left thyroid nodule. Electronically Signed   By: Jacqulynn Cadet M.D.   On: 10/06/2015 17:00    Microbiology: Recent Results (from the past 240 hour(s))  C difficile quick scan w PCR reflex     Status: None   Collection Time: 10/20/15  2:15 AM  Result Value Ref Range Status   C Diff antigen NEGATIVE NEGATIVE Final   C Diff toxin NEGATIVE NEGATIVE Final   C Diff interpretation Negative for toxigenic C. difficile  Final    Comment: Performed  at [facility]: Basic Metabolic Panel:  Recent Labs Lab 10/20/15 0155 10/21/15 0012 10/24/15 0742  NA 136 138 135  K 3.6 3.6 3.4*  CL 103 108 104  CO2 19* 21* 24  GLUCOSE 120* 121* 98  BUN 20 14 18   CREATININE 1.40* 1.15 1.11  CALCIUM 10.5* 9.3 8.8*   Liver Function Tests:  Recent Labs Lab 10/20/15 0155  AST 21  ALT 18  ALKPHOS 61  BILITOT 1.0  PROT 9.0*  ALBUMIN 5.0   No results for input(s): LIPASE, AMYLASE in the last 168 hours. No results for input(s): AMMONIA in the last 168 hours. CBC:  Recent Labs Lab 10/20/15 0155 10/21/15 0012 10/24/15 0542  WBC 13.7* 15.7* 10.1  NEUTROABS 9.7*  --  4.0  HGB 18.5* 16.5 15.4  HCT 52.1* 47.2 45.3  MCV 85.0 87.7 88.5  PLT 211 180 156   Cardiac Enzymes: No results for input(s): CKTOTAL, CKMB, CKMBINDEX, TROPONINI in the last 168 hours. BNP: BNP (last 3 results) No results for input(s): BNP in the last 8760 hours.  ProBNP (last 3 results) No results for input(s): PROBNP in the last 8760 hours.  CBG: No results for input(s): GLUCAP in the last 168 hours.     Signed:  Karmen Bongo  [facility] 10/24/2015, 1:21 PM

## 2015-10-21 NOTE — Care Management Note (Signed)
Case Management Note  Patient Details  Name: [name] MRN: [id number] Date of Birth: 04-Mar-1961  Subjective/Objective:   Pt in with noninfectious gastroenteritis and colitis. He is from home with his spouse.                 Action/Plan: Continued IV antibiotic therapy. CM following for discharge needs.    Expected Discharge Date:                  Expected Discharge Plan:  Home/Self Care  In-House Referral:     Discharge planning Services     Post Acute Care Choice:    Choice offered to:     DME Arranged:    DME Agency:     HH Arranged:    HH Agency:     Status of Service:  In process, will continue to follow  If discussed at Long Length of Stay Meetings, dates discussed:    Additional Comments:  Pollie Friar, RN 10/21/2015, 11:53 AM

## 2015-10-21 NOTE — Progress Notes (Signed)
Pt requested medication for diarrhea. Md has been notified stated that she will order something for relief.

## 2015-10-22 ENCOUNTER — Ambulatory Visit: Payer: BLUE CROSS/BLUE SHIELD | Admitting: Internal Medicine

## 2015-10-22 ENCOUNTER — Encounter: Payer: Self-pay | Admitting: Internal Medicine

## 2015-10-22 DIAGNOSIS — K5289 Other specified noninfective gastroenteritis and colitis: Secondary | ICD-10-CM | POA: Diagnosis not present

## 2015-10-22 DIAGNOSIS — Z0289 Encounter for other administrative examinations: Secondary | ICD-10-CM

## 2015-10-22 LAB — MISC LABCORP TEST (SEND OUT): Labcorp test code: 183480

## 2015-10-22 NOTE — Progress Notes (Signed)
PROGRESS NOTE   HPI: [name] is a 55 y.o. male with medical history significant for hypertension, asthma, GERD, hypothyroidism, TIA, BPH, drug abuse and recent diagnosis of C. difficile presents to room 3 on 85M at [facility] from [facility] with the chief complaint of persistent nausea vomiting and diarrhea. Initial evaluation reveals acute kidney injury and is concerning for C. Difficile/colitis  Formation is obtained from the patient. He reports one month ago he was diagnosed with Escherichia coli gastroenteritis and treated with ciprofloxacin. He reports compliant with antibiotics and completed the treatment. He states yesterday hamburger and his symptoms began shortly thereafter. Describes the pain as abdomen cramp-like located throughout the abdomen. Eating or drinking makes it worse and nothing makes it better. He reports having 6-10 episodes of emesis in the last 24 hours. He denies coffee ground emesis. In addition he's had "several" episodes of watery stool. He denies dark stool or bright red blood per rectum. Associated symptoms include headache dizziness subjective fever and chills. He denies chest pain palpitation shortness of breath cough dysuria hematuria frequency or urgency.    Assessment & Plan:  Principal Problem:  Noninfectious gastroenteritis and colitis Active Problems:  Essential hypertension  GERD  Hypothyroidism  CAD (coronary artery disease)  Vomiting and diarrhea  Acute kidney injury ([place])  Dehydration  Malnutrition of moderate degree  1. Noninfectious gastroenteritis and colitis. Patient recently completed Cipro for C. Difficile. EDF PCR negative today. GI panel pending Leukocytosis 13.7 he is afebrile, hemodynamically stable and improving. -Continue Cipro and Flagyl initiated -IV fluids -Follow GI pathogen panel - C. Diff negative -Will advance diet as tolerated and add Boost Breeze po TID as per nutrition -Scheduled Zofran for today  low-dose, seen for breakthrough -Analgesia, requesting medication for sleep -Unable to tolerate PO contrast, only IV  #2. Acute kidney injury. Creatinine 1.4 on admission. No history of chronic kidney disease -IV fluids as noted above -Resolved, now creatinine 0.95  #3. Hypertension. Home meds include Norvasc, Cozaar -Home meds resumed 6/29 pm based on improvement, BPs improved since initiation -Continue hydralazine IV prn  #4. CAD. Denies chest pain. Cardiac cath 2016 revealed nonobstructive mid and distal RCA plaquing less than 50%, normal LV systolic function with an ejection fraction of 50-55%. Recommended aggressive risk factor modification -Continue aspirin when able  #5. [name]. Stable at baseline  #6. Hypothyroidism. Not on medication -TSH low at 0.214 which does not go along with uncontrolled hypothyroidism. Will check free T4.  #7. Moderate malnutrition in the setting of acute illness/injury. As per nutrition: Add Boost Breeze po TID if advanced to clear liquids, each supplement provides 250 kcal and 9 grams of protein Add Ensure Enlive po BID when advanced to full liquids or regular diet, each supplement provides 350 kcal and 20 grams of protein   DVT prophylaxis: Lovenox Code Status: full  Family Communication: none  Disposition Plan: home tomorrow Consults called: none  Admission status: obs   Subjective: Patient reported less abd pain, decreased stools, improved n/v.  Still unable to tolerate diet, will try to slowly advance.  Objective: Filed Vitals:   10/22/15 0604 10/22/15 0911 10/22/15 1425 10/22/15 1729  BP: 145/96 135/89 119/74 113/81  Pulse: 88 81 71 71  Temp: 98.2 F (36.8 C) 98.2 F (36.8 C) 98.2 F (36.8 C) 97.9 F (36.6 C)  TempSrc: Oral Oral Oral Oral  Resp: 18 18 20 20   Height:      Weight:      SpO2: 99% 96%  99% 97%    Intake/Output Summary (Last 24 hours) at 10/22/15 1826 Last data filed at 10/22/15 0803  Gross per 24 hour  Intake       0 ml  Output    600 ml  Net   -600 ml   Filed Weights   10/20/15 0105  Weight: 92.534 kg (204 lb)    Examination:  General exam: Appears calm and comfortable  Respiratory system: Clear to auscultation. Respiratory effort normal. Cardiovascular system: S1 & S2 heard, RRR. No JVD, murmurs, rubs, gallops or clicks. No pedal edema. Gastrointestinal system: Abdomen is nondistended, soft and nontender. No organomegaly or masses felt. Normal bowel sounds heard. Central nervous system: Alert and oriented. No focal neurological deficits. Extremities: Symmetric 5 x 5 power. Skin: No rashes, lesions or ulcers Psychiatry: Judgement and insight appear normal. Mood & affect appropriate.     Data Reviewed: I have personally reviewed following labs and imaging studies  CBC:  Recent Labs Lab 10/20/15 0155 10/21/15 0012  WBC 13.7* 15.7*  NEUTROABS 9.7*  --   HGB 18.5* 16.5  HCT 52.1* 47.2  MCV 85.0 87.7  PLT 211 [id number]   Basic Metabolic Panel:  Recent Labs Lab 10/20/15 0155 10/21/15 0012  NA 136 138  K 3.6 3.6  CL 103 108  CO2 19* 21*  GLUCOSE 120* 121*  BUN 20 14  CREATININE 1.40* 1.15  CALCIUM 10.5* 9.3   GFR: Estimated Creatinine Clearance: 85.9 mL/min (by C-G formula based on Cr of 1.15). Liver Function Tests:  Recent Labs Lab 10/20/15 0155  AST 21  ALT 18  ALKPHOS 61  BILITOT 1.0  PROT 9.0*  ALBUMIN 5.0   No results for input(s): LIPASE, AMYLASE in the last 168 hours. No results for input(s): AMMONIA in the last 168 hours. Coagulation Profile: No results for input(s): INR, PROTIME in the last 168 hours. Cardiac Enzymes: No results for input(s): CKTOTAL, CKMB, CKMBINDEX, TROPONINI in the last 168 hours. BNP (last 3 results) No results for input(s): PROBNP in the last 8760 hours. HbA1C: No results for input(s): HGBA1C in the last 72 hours. CBG: No results for input(s): GLUCAP in the last 168 hours. Lipid Profile: No results for input(s): CHOL, HDL,  LDLCALC, TRIG, CHOLHDL, LDLDIRECT in the last 72 hours. Thyroid Function Tests:  Recent Labs  10/20/15 0936 10/21/15 2140  TSH 0.214*  --   FREET4  --  1.01   Anemia Panel: No results for input(s): VITAMINB12, FOLATE, FERRITIN, TIBC, IRON, RETICCTPCT in the last 72 hours. Urine analysis:    Component Value Date/Time   COLORURINE YELLOW 10/21/2015 0610   APPEARANCEUR CLEAR 10/21/2015 0610   LABSPEC 1.026 10/21/2015 0610   PHURINE 6.0 10/21/2015 0610   GLUCOSEU NEGATIVE 10/21/2015 0610   GLUCOSEU NEGATIVE 02/04/2014 0830   HGBUR NEGATIVE 10/21/2015 0610   BILIRUBINUR NEGATIVE 10/21/2015 0610   KETONESUR 15* 10/21/2015 0610   PROTEINUR NEGATIVE 10/21/2015 0610   UROBILINOGEN 0.2 02/04/2014 0830   NITRITE NEGATIVE 10/21/2015 0610   LEUKOCYTESUR NEGATIVE 10/21/2015 0610   Sepsis Labs: @LABRCNTIP (procalcitonin:4,lacticidven:4)  ) Recent Results (from the past 240 hour(s))  C difficile quick scan w PCR reflex     Status: None   Collection Time: 10/20/15  2:15 AM  Result Value Ref Range Status   C Diff antigen NEGATIVE NEGATIVE Final   C Diff toxin NEGATIVE NEGATIVE Final   C Diff interpretation Negative for toxigenic C. difficile  Final    Comment: Performed at [facility]  Radiology Studies: Ct Abdomen Pelvis W Contrast  10/20/2015  CLINICAL DATA:  Diffuse abdominal pain with nausea, vomiting, and diarrhea. EXAM: CT ABDOMEN AND PELVIS WITH CONTRAST TECHNIQUE: Multidetector CT imaging of the abdomen and pelvis was performed using the standard protocol following bolus administration of intravenous contrast. CONTRAST:  135mL ISOVUE-300 IOPAMIDOL (ISOVUE-300) INJECTION 61% COMPARISON:  09/22/2015 FINDINGS: Lower chest:  Unremarkable. Hepatobiliary: No focal abnormality within the liver parenchyma. There is no evidence for gallstones, gallbladder wall thickening, or pericholecystic fluid. No intrahepatic or extrahepatic biliary dilation. Pancreas: No focal mass  lesion. No dilatation of the main duct. No intraparenchymal cyst. No peripancreatic edema. Spleen: Tiny hypodensity in the spleen is stable and likely represents a cyst or pseudocyst. Adrenals/Urinary Tract: No adrenal nodule or mass. Right kidney is unremarkable. Again noted in the interpolar region of the left kidney is a 1.9 x 1.7 cm lesion with heterogeneous attenuation and potential enhancement. Also in the interpolar left kidney is a second lesion measuring 2.4 cm. This lesion has homogeneous attenuation and measures water density, compatible with a simple cyst. No evidence for hydroureter. Despite the lack of hydroureter, the patient has an 8 x 5 mm stone in the left UVJ. This stone was seen in the left kidney on an exam from 12/07/2011. Stomach/Bowel: Stomach is nondistended. No gastric wall thickening. No evidence of outlet obstruction. Duodenum is normally positioned as is the ligament of Treitz. No small bowel wall thickening. No small bowel dilatation. The terminal ileum is normal. No gross colonic mass. No colonic wall thickening. No substantial diverticular change. Vascular/Lymphatic: There is abdominal aortic atherosclerosis without aneurysm. There is no gastrohepatic or hepatoduodenal ligament lymphadenopathy. No intraperitoneal or retroperitoneal lymphadenopathy. No pelvic sidewall lymphadenopathy. Reproductive: The prostate gland and seminal vesicles have normal imaging features. Other: No intraperitoneal free fluid. Musculoskeletal: Small umbilical hernia contains only fat. 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 8 x 5 mm stone in the region of the left UVJ is probably in the left UVJ. The patient had a stone of similar size in the left kidney on 12/07/2011 which was not present in the kidney on 09/22/2015, suggesting interval migration between those 2 studies. It is somewhat unusual that there is no left hydroureteronephrosis given the size of this  stone and stone projects essentially in the bladder wall but on sagittal reformations does appear to be just into the lumen of the bladder. Given the relative stability in this appearance since 09/22/2015, this is not an acute finding on today's study. There is no abnormal perfusion in the left kidney to suggest obstructive uropathy. 2. Otherwise no acute findings on today's exam. 3. Abdominal aortic atherosclerosis. Electronically Signed   By: Misty Stanley M.D.   On: 10/20/2015 19:56        Scheduled Meds: . amLODipine  10 mg Oral Daily  . ciprofloxacin  400 mg Intravenous Q12H  . enoxaparin (LOVENOX) injection  40 mg Subcutaneous Q24H  . feeding supplement  1 Container Oral TID BM  . losartan  50 mg Oral Daily  . metronidazole  500 mg Intravenous Q8H   Continuous Infusions:       Time spent: 35 minutes    Karmen Bongo, MD [facility]   If 7PM-7AM, please contact night-coverage [email or web address] Password [facility] 10/22/2015, 6:26 PM

## 2015-10-22 NOTE — Progress Notes (Signed)
Pt unable to tolerate meal. Advanced diet per Md verbal and Md notified. Pt stated that he started eating his fish when he felt like he wanted to vomit. No noted distress. Administered Enlive Ensure shake per request. Will continue to monitor.

## 2015-10-23 DIAGNOSIS — N179 Acute kidney failure, unspecified: Secondary | ICD-10-CM | POA: Diagnosis present

## 2015-10-23 DIAGNOSIS — Z7982 Long term (current) use of aspirin: Secondary | ICD-10-CM | POA: Diagnosis not present

## 2015-10-23 DIAGNOSIS — E039 Hypothyroidism, unspecified: Secondary | ICD-10-CM | POA: Diagnosis present

## 2015-10-23 DIAGNOSIS — E860 Dehydration: Secondary | ICD-10-CM | POA: Diagnosis present

## 2015-10-23 DIAGNOSIS — I2510 Atherosclerotic heart disease of native coronary artery without angina pectoris: Secondary | ICD-10-CM | POA: Diagnosis present

## 2015-10-23 DIAGNOSIS — Z8673 Personal history of transient ischemic attack (TIA), and cerebral infarction without residual deficits: Secondary | ICD-10-CM | POA: Diagnosis not present

## 2015-10-23 DIAGNOSIS — E440 Moderate protein-calorie malnutrition: Secondary | ICD-10-CM | POA: Diagnosis present

## 2015-10-23 DIAGNOSIS — Z221 Carrier of other intestinal infectious diseases: Secondary | ICD-10-CM | POA: Diagnosis not present

## 2015-10-23 DIAGNOSIS — A047 Enterocolitis due to Clostridium difficile: Secondary | ICD-10-CM | POA: Diagnosis present

## 2015-10-23 DIAGNOSIS — K5289 Other specified noninfective gastroenteritis and colitis: Secondary | ICD-10-CM | POA: Diagnosis not present

## 2015-10-23 DIAGNOSIS — N400 Enlarged prostate without lower urinary tract symptoms: Secondary | ICD-10-CM | POA: Diagnosis present

## 2015-10-23 DIAGNOSIS — I10 Essential (primary) hypertension: Secondary | ICD-10-CM | POA: Diagnosis present

## 2015-10-23 DIAGNOSIS — E785 Hyperlipidemia, unspecified: Secondary | ICD-10-CM | POA: Diagnosis present

## 2015-10-23 DIAGNOSIS — F17210 Nicotine dependence, cigarettes, uncomplicated: Secondary | ICD-10-CM | POA: Diagnosis present

## 2015-10-23 DIAGNOSIS — Z6829 Body mass index (BMI) 29.0-29.9, adult: Secondary | ICD-10-CM | POA: Diagnosis not present

## 2015-10-23 MED ORDER — SACCHAROMYCES BOULARDII 250 MG PO CAPS
250.0000 mg | ORAL_CAPSULE | Freq: Two times a day (BID) | ORAL | Status: DC
  Administered 2015-10-23 – 2015-10-24 (×3): 250 mg via ORAL
  Filled 2015-10-23 (×3): qty 1

## 2015-10-23 MED ORDER — VANCOMYCIN 50 MG/ML ORAL SOLUTION
125.0000 mg | Freq: Four times a day (QID) | ORAL | Status: DC
  Administered 2015-10-23 – 2015-10-24 (×5): 125 mg via ORAL
  Filled 2015-10-23 (×6): qty 2.5

## 2015-10-23 NOTE — Consult Note (Signed)
Consult for [facility]  Reason for Consult: Diarrhea Referring Physician: [facility] Primary GI: [facility] 2006.  [name] HPI: This is a 55 year old male with multiple medical problems diagnosed with enteropathogenic E. Coli one month ago and C. Diff colonization.  He was treated with Cipro and Flagyl and his symptoms improved in short order.  He followed up as an outpatient with his PCP and he denies any further diarrhea until three days before admission.  He started to have nausea, vomiting, and diarrhea.  Initial evaluation in the ER revealed that he was mildly dehydrated with an elevation of his creatinine and hemoconcentration, however, this improved with IV hydration.  He was restarted on Cipro/Flagyl and a repeat C. Diff PCR was obtained, however, there currently is no evidence of C. Diff.  Also, he has not responded to the repeat treatment with cipro and Flagyl.  Currently he has multiple watery bowel movements per day.  On a routine basis he has an elevated WBC and his PCP was going to refer him to Hematology for further work up.  Past Medical History  Diagnosis Date  . Hypertension   . Unspecified asthma(493.90)   . Chest pain, atypical   . Nontoxic multinodular goiter   . Dyspnea on exertion   . Trigeminal neuralgia   . Cluster headache   . Hyperlipidemia   . GERD (gastroesophageal reflux disease)   . Complication of anesthesia     difficult waking up"  . Hiatal hernia   . Pulmonary nodule --- CT 10/19/2014: Nodule is stable, no further routine x-rays 01/06/2009  . Hypothyroidism   . TIA (transient ischemic attack)   . BPH (benign prostatic hyperplasia)   . Drug abuse     Past Surgical History  Procedure Laterality Date  . Appendectomy    . Knee arthroscopy    . 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 Right shoulder redo-reconstruction  06/21/10  . Destruction trigeminal nerve via neurolytic agent  x 3 , R side last ~2010  .  Cardiac catheterization N/A 11/25/2014    Procedure: Left Heart Cath and Coronary Angiography;  Surgeon: Belva Crome, MD;  Location: [facility];  Service: Cardiovascular;  Laterality: N/A;    Family History  Problem Relation Age of Onset  . Diabetes Mother   . Hyperlipidemia Mother   . Hypertension Mother   . Hypertension Father   . Heart disease Father     CAD/CHF  . Cancer Father     Mesothelioma  . Heart disease Maternal Aunt   . Prostate cancer Neg Hx   . Colon cancer Neg Hx     Social History:  reports that he has been smoking Cigarettes.  He has a 4.5 pack-year smoking history. He has never used smokeless tobacco. He reports that he does not drink alcohol or use illicit drugs.  Allergies: No Known Allergies  Medications:  Scheduled: . amLODipine  10 mg Oral Daily  . enoxaparin (LOVENOX) injection  40 mg Subcutaneous Q24H  . feeding supplement  1 Container Oral TID BM  . losartan  50 mg Oral Daily  . metronidazole  500 mg Intravenous Q8H  . saccharomyces boulardii  250 mg Oral BID  . vancomycin  125 mg Oral Q6H   Continuous:   No results found for this or any previous visit (from the past 24 hour(s)).   No results found.  ROS:  As stated above in  the HPI otherwise negative.  Blood pressure 103/64, pulse 77, temperature 98.2 F (36.8 C), temperature source Oral, resp. rate 18, height 5\' 10"  (1.778 m), weight 92.534 kg (204 lb), SpO2 97 %.    PE: Gen: NAD, Alert and Oriented HEENT:  [place]/AT, EOMI Neck: Supple, no LAD Lungs: CTA Bilaterally CV: RRR without M/G/R ABM: Soft, lower mid abdominal pain, +BS Ext: No C/C/E  Assessment/Plan: 1) Diarrhea. 2) Recent history of C. Diff colonization. 3) Recent history of enteropathogenic E. Coli.   Typically enteropathogenic E. Coli does not require any antibiotic treatment.  IV hydration and other supportive care measures typically suffice, however, he did respond to Cipro and Flagyl.  Unfortunately he has  relapse of the diarrhea and his repeat C. Diff PCR is negative.  It is possible that he has refractory C. Diff/refractory C. diff, a new infection with C. Diff of a different strain, or no C. Diff at all.  He did present with dehydration and I think it will be worthwhile to see if he responds to C. Diff treatment with vancomycin.  He was retreated with Flagyl during this second admission, but Cipro was also added.  Cipro is a risk factor for C. Diff.  It may be too hasty to switch him over to vanc right now, but I think it is worthwhile to see if his clinical course can be improved.    Plan: 1) Vancomycin 125 mg QID orally. 2) Florastor. 3) Continue with supportive care. 4) D/C Cipro and Flagyl. 5) If there is no improvement and endoscopic evaluation will be required.   Caralynn Gelber D 10/23/2015, 2:51 PM

## 2015-10-23 NOTE — Progress Notes (Signed)
PROGRESS NOTE   HPI: [name] is a 55 y.o. male with medical history significant for hypertension, asthma, GERD, hypothyroidism, TIA, BPH, drug abuse and recent diagnosis of C. difficile presents to room 3 on 58M at [facility] from [facility] with the chief complaint of persistent nausea vomiting and diarrhea. Initial evaluation reveals acute kidney injury and is concerning for C. Difficile/colitis  Formation is obtained from the patient. He reports one month ago he was diagnosed with Escherichia coli gastroenteritis and treated with ciprofloxacin. He reports compliant with antibiotics and completed the treatment. He states yesterday hamburger and his symptoms began shortly thereafter. Describes the pain as abdomen cramp-like located throughout the abdomen. Eating or drinking makes it worse and nothing makes it better. He reports having 6-10 episodes of emesis in the last 24 hours. He denies coffee ground emesis. In addition he's had "several" episodes of watery stool. He denies dark stool or bright red blood per rectum. Associated symptoms include headache dizziness subjective fever and chills. He denies chest pain palpitation shortness of breath cough dysuria hematuria frequency or urgency.    Assessment & Plan:  Principal Problem:  Noninfectious gastroenteritis and colitis Active Problems:  Essential hypertension  GERD  Hypothyroidism  CAD (coronary artery disease)  Vomiting and diarrhea  Acute kidney injury ([place])  Dehydration  Malnutrition of moderate degree  1. Noninfectious gastroenteritis and colitis. - Admitted 5/31-6/2 with similar symptoms and at that time positive for Enteropathic E. Coli and treated with Cipro.  Also had C. Diff antigen positive but toxin negative. - CT with IV contrast only, unable to tolerate PO contrast - Leukocytosis continues, 15.7 on 6/29 but he is afebrile, hemodynamically stable. - On current admission, continues to have C. Diff  antigen positive and toxin negative but GI path panel is negative. -Has been on Cipro/Flagyl since readmission on 6/28. -IV fluids -Attempted to advance diet as tolerated and add Boost Breeze po TID as per nutrition; patient with persistent n/v and fecal urgency immediately upon eating/drinking -Ongoing need for anti-emetics -Analgesia, requesting medication for sleep - GI consult requested today.  Spoke with Dr. Benson Norway who recommends discontinuing Cipro and continuing Flagyl, with initiation of Vanc 125 PO q6h.  He also recommends starting Florastor.  He will see patient today.  #2. Acute kidney injury. Creatinine 1.4 on admission. No history of chronic kidney disease -IV fluids as noted above -Resolved, creatinine 1.15 on last check  #3. Hypertension. Home meds include Norvasc, Cozaar -Home meds resumed 6/29 pm based on improvement, BPs improved since initiation -Continue hydralazine IV prn  #4. CAD. Denies chest pain. Cardiac cath 2016 revealed nonobstructive mid and distal RCA plaquing less than 50%, normal LV systolic function with an ejection fraction of 50-55%. Recommended aggressive risk factor modification -Resume aspirin  #5. [name]. Stable at baseline  #6. Hypothyroidism. Not on medication -TSH low at 0.214 which does not go along with uncontrolled hypothyroidism. Free T4 1.01 (normal).  Suggest recheck in 4-6 weeks.  #7. Moderate malnutrition in the setting of acute illness/injury. As per nutrition: Add Boost Breeze po TID if advanced to clear liquids, each supplement provides 250 kcal and 9 grams of protein Add Ensure Enlive po BID when advanced to full liquids or regular diet, each supplement provides 350 kcal and 20 grams of protein   DVT prophylaxis: Lovenox Code Status: full  Family Communication: none  Disposition Plan: home - possibly tomorrow if doing better Consults called: none  Admission status: obs   Subjective: Appears  comfortable but reports ongoing  inability to tolerate PO (ate a small amount of oatmeal and tolerated a few clear liquids, but otherwise none) and continues to have 5 or more loose stools/day.  Objective: Filed Vitals:   10/22/15 1729 10/22/15 2150 10/23/15 0503 10/23/15 1038  BP: 113/81 139/78 125/77 110/74  Pulse: 71 70 62 79  Temp: 97.9 F (36.6 C) 98.8 F (37.1 C) 98.1 F (36.7 C) 98.1 F (36.7 C)  TempSrc: Oral Oral Oral Oral  Resp: 20 18 18 18   Height:      Weight:      SpO2: 97% 96% 96% 98%    Intake/Output Summary (Last 24 hours) at 10/23/15 1354 Last data filed at 10/23/15 0830  Gross per 24 hour  Intake    120 ml  Output    500 ml  Net   -380 ml   Filed Weights   10/20/15 0105  Weight: 92.534 kg (204 lb)    Examination:  General exam: Appears calm and comfortable  Respiratory system: Clear to auscultation. Respiratory effort normal. Cardiovascular system: S1 & S2 heard, RRR. No JVD, murmurs, rubs, gallops or clicks. No pedal edema. Gastrointestinal system: Abdomen is nondistended, soft and mildly tender primarily in the mid-lower abdomen. No organomegaly or masses felt. Normal bowel sounds heard. Central nervous system: Alert and oriented. No focal neurological deficits. Extremities: Symmetric 5 x 5 power. Skin: No rashes, lesions or ulcers Psychiatry: Judgement and insight appear normal. Mood & affect appropriate.     Data Reviewed: I have personally reviewed following labs and imaging studies  CBC:  Recent Labs Lab 10/20/15 0155 10/21/15 0012  WBC 13.7* 15.7*  NEUTROABS 9.7*  --   HGB 18.5* 16.5  HCT 52.1* 47.2  MCV 85.0 87.7  PLT 211 99991111   Basic Metabolic Panel:  Recent Labs Lab 10/20/15 0155 10/21/15 0012  NA 136 138  K 3.6 3.6  CL 103 108  CO2 19* 21*  GLUCOSE 120* 121*  BUN 20 14  CREATININE 1.40* 1.15  CALCIUM 10.5* 9.3   GFR: Estimated Creatinine Clearance: 85.9 mL/min (by C-G formula based on Cr of 1.15). Liver Function Tests:  Recent Labs Lab  10/20/15 0155  AST 21  ALT 18  ALKPHOS 61  BILITOT 1.0  PROT 9.0*  ALBUMIN 5.0   No results for input(s): LIPASE, AMYLASE in the last 168 hours. No results for input(s): AMMONIA in the last 168 hours. Coagulation Profile: No results for input(s): INR, PROTIME in the last 168 hours. Cardiac Enzymes: No results for input(s): CKTOTAL, CKMB, CKMBINDEX, TROPONINI in the last 168 hours. BNP (last 3 results) No results for input(s): PROBNP in the last 8760 hours. HbA1C: No results for input(s): HGBA1C in the last 72 hours. CBG: No results for input(s): GLUCAP in the last 168 hours. Lipid Profile: No results for input(s): CHOL, HDL, LDLCALC, TRIG, CHOLHDL, LDLDIRECT in the last 72 hours. Thyroid Function Tests:  Recent Labs  10/21/15 2140  FREET4 1.01   Anemia Panel: No results for input(s): VITAMINB12, FOLATE, FERRITIN, TIBC, IRON, RETICCTPCT in the last 72 hours. Urine analysis:    Component Value Date/Time   COLORURINE YELLOW 10/21/2015 0610   APPEARANCEUR CLEAR 10/21/2015 0610   LABSPEC 1.026 10/21/2015 0610   PHURINE 6.0 10/21/2015 0610   GLUCOSEU NEGATIVE 10/21/2015 0610   GLUCOSEU NEGATIVE 02/04/2014 0830   HGBUR NEGATIVE 10/21/2015 0610   BILIRUBINUR NEGATIVE 10/21/2015 0610   KETONESUR 15* 10/21/2015 0610   PROTEINUR NEGATIVE 10/21/2015 [id number]  UROBILINOGEN 0.2 02/04/2014 0830   NITRITE NEGATIVE 10/21/2015 0610   LEUKOCYTESUR NEGATIVE 10/21/2015 0610   Sepsis Labs: @LABRCNTIP (procalcitonin:4,lacticidven:4)  ) Recent Results (from the past 240 hour(s))  C difficile quick scan w PCR reflex     Status: None   Collection Time: 10/20/15  2:15 AM  Result Value Ref Range Status   C Diff antigen NEGATIVE NEGATIVE Final   C Diff toxin NEGATIVE NEGATIVE Final   C Diff interpretation Negative for toxigenic C. difficile  Final    Comment: Performed at [facility]         Radiology Studies: No results found.      Scheduled Meds: . amLODipine  10  mg Oral Daily  . enoxaparin (LOVENOX) injection  40 mg Subcutaneous Q24H  . feeding supplement  1 Container Oral TID BM  . losartan  50 mg Oral Daily  . metronidazole  500 mg Intravenous Q8H  . vancomycin  125 mg Oral Q6H   Continuous Infusions:       Time spent: 35 minutes    Karmen Bongo, MD [facility]   If 7PM-7AM, please contact night-coverage [email or web address] Password [email or web address] 10/23/2015, 1:54 PM

## 2015-10-24 ENCOUNTER — Encounter: Payer: Self-pay | Admitting: Internal Medicine

## 2015-10-24 LAB — BASIC METABOLIC PANEL
Anion gap: 7 (ref 5–15)
BUN: 18 mg/dL (ref 6–20)
CO2: 24 mmol/L (ref 22–32)
Calcium: 8.8 mg/dL — ABNORMAL LOW (ref 8.9–10.3)
Chloride: 104 mmol/L (ref 101–111)
Creatinine, Ser: 1.11 mg/dL (ref 0.61–1.24)
GFR calc Af Amer: >60 mL/min (ref >60)
GFR calc non Af Amer: >60 mL/min (ref >60)
Glucose, Bld: 98 mg/dL (ref 65–99)
Potassium: 3.4 mmol/L — ABNORMAL LOW (ref 3.5–5.1)
Sodium: 135 mmol/L (ref 135–145)

## 2015-10-24 LAB — CBC WITH DIFFERENTIAL/PLATELET
Basophils Absolute: 0 10*3/uL (ref 0.0–0.1)
Basophils Relative: 0 %
Eosinophils Absolute: 0.2 10*3/uL (ref 0.0–0.7)
Eosinophils Relative: 2 %
HCT: 45.3 % (ref 39.0–52.0)
Hemoglobin: 15.4 g/dL (ref 13.0–17.0)
Lymphocytes Relative: 46 %
Lymphs Abs: 4.7 10*3/uL — ABNORMAL HIGH (ref 0.7–4.0)
MCH: 30.1 pg (ref 26.0–34.0)
MCHC: 34 g/dL (ref 30.0–36.0)
MCV: 88.5 fL (ref 78.0–100.0)
Monocytes Absolute: 1.2 10*3/uL — ABNORMAL HIGH (ref 0.1–1.0)
Monocytes Relative: 12 %
Neutro Abs: 4 10*3/uL (ref 1.7–7.7)
Neutrophils Relative %: 40 %
Platelets: 156 10*3/uL (ref 150–400)
RBC: 5.12 MIL/uL (ref 4.22–5.81)
RDW: 13.3 % (ref 11.5–15.5)
WBC: 10.1 10*3/uL (ref 4.0–10.5)

## 2015-10-24 MED ORDER — SACCHAROMYCES BOULARDII 250 MG PO CAPS: 250.0000 mg | ORAL_CAPSULE | Freq: Two times a day (BID) | ORAL | Status: DC

## 2015-10-24 MED ORDER — FAMOTIDINE 40 MG PO TABS: 40.0000 mg | ORAL_TABLET | Freq: Every day | ORAL | Status: DC

## 2015-10-24 MED ORDER — VANCOMYCIN HCL 125 MG PO CAPS: 125.0000 mg | ORAL_CAPSULE | Freq: Two times a day (BID) | ORAL | Status: DC

## 2015-10-24 MED ORDER — ENSURE ENLIVE PO LIQD: 1.0000 | Freq: Two times a day (BID) | ORAL | Status: DC

## 2015-10-24 MED ORDER — FAMOTIDINE 20 MG PO TABS
40.0000 mg | ORAL_TABLET | Freq: Every day | ORAL | Status: DC
  Administered 2015-10-24: 40 mg via ORAL
  Filled 2015-10-24: qty 2

## 2015-10-24 NOTE — Discharge Instructions (Signed)
You have been hospitalized for the following problems:  1. Gastroenteritis - while the C. Difficile testing was negative, it is possible that you do have this infection and so ongoing antibiotic treatment is reasonable.   -Continue to slowly advance your diet.  Start with clear liquids, then full liquids, then bland foods (bananas, apples, rice, toast), and eventually resume a regular diet as tolerated. -Continue to drink Ensure Enlive twice daily until you can resume a regular diet. -Continue to take oral medicines for nausea/vomiting/diarrhea as needed. -Take oral antibiotics (Vancomycin) twice daily for a total of 14 days. -Continue to take probiotics (Florastor). -Do not take Prevacid for now and instead take Pepcid twice daily for reflux.  2.  Acute kidney injury - this was present on admission and has resolved.  3.  Hypertension - Continue your home medications.  4. Hypothyroidism - Your TSH was abnormal but the other thyroid test was normal.  No changes were made to your medications.  Have your doctor recheck your thyroid function in 4-6 weeks.

## 2015-10-24 NOTE — Progress Notes (Signed)
Patient ready for discharge to home; discharge instructions given and reviewed; Rx's sent electronically; letter for work given to patient from MD. Patient dressed and ready for discharge out; discharged out via wheelchair accompanied by his wife.

## 2015-10-24 NOTE — Progress Notes (Signed)
Subjective: Diarrhea and abdominal pain improved.  Objective: Vital signs in last 24 hours: Temp:  [97.9 F (36.6 C)-98.6 F (37 C)] 97.9 F (36.6 C) (07/02 0201) Pulse Rate:  [64-79] 76 (07/02 0201) Resp:  [16-18] 16 (07/02 0201) BP: (95-111)/(56-74) 95/70 mmHg (07/02 0201) SpO2:  [94 %-99 %] 95 % (07/02 0201) Last BM Date: 10/23/15  Intake/Output from previous day: 07/01 0701 - 07/02 0700 In: 120 [P.O.:120] Out: -  Intake/Output this shift:    General appearance: alert and no distress GI: improved low abdominal tenderness  Lab Results:  Recent Labs  10/24/15 0542  WBC 10.1  HGB 15.4  HCT 45.3  PLT 156   BMET  Recent Labs  10/24/15 0742  NA 135  K 3.4*  CL 104  CO2 24  GLUCOSE 98  BUN 18  CREATININE 1.11  CALCIUM 8.8*   LFT No results for input(s): PROT, ALBUMIN, AST, ALT, ALKPHOS, BILITOT, BILIDIR, IBILI in the last 72 hours. PT/INR No results for input(s): LABPROT, INR in the last 72 hours. Hepatitis Panel No results for input(s): HEPBSAG, HCVAB, HEPAIGM, HEPBIGM in the last 72 hours. C-Diff No results for input(s): CDIFFTOX in the last 72 hours. Fecal Lactopherrin No results for input(s): FECLLACTOFRN in the last 72 hours.  Studies/Results: No results found.  Medications:  Scheduled: . amLODipine  10 mg Oral Daily  . enoxaparin (LOVENOX) injection  40 mg Subcutaneous Q24H  . famotidine  40 mg Oral Daily  . feeding supplement  1 Container Oral TID BM  . losartan  50 mg Oral Daily  . saccharomyces boulardii  250 mg Oral BID  . vancomycin  125 mg Oral Q6H   Continuous:   Assessment/Plan: 1) Diarrhea - presumed to be C. Diff. 2) GERD.   He appears to be responding to treatment, but it is too soon to make a complete call.  If he is responding to treatment with vancomycin, he should be treated for a total of 14 days.  The patient complained about GERD and he takes Prevacid.  I ordered famotidine, formulary medication, as there is some  adverse effects with PPIs and C. Diff.   Dr. Henrene Pastor to assume care in the AM.   LOS: 1 day   [name] D 10/24/2015, 9:46 AM

## 2015-10-25 ENCOUNTER — Telehealth: Payer: Self-pay | Admitting: Behavioral Health

## 2015-10-25 LAB — LACTOFERRIN, FECAL, QUANT.: Lactoferrin, Fecal, Quant.: <1 ug/mL(g) (ref 0.00–7.24)

## 2015-10-25 NOTE — Telephone Encounter (Signed)
Attempted to reach patient for Hospital Follow-up call. Left message for patient to return call when available.

## 2015-10-29 ENCOUNTER — Encounter (HOSPITAL_BASED_OUTPATIENT_CLINIC_OR_DEPARTMENT_OTHER): Payer: Self-pay

## 2015-10-29 ENCOUNTER — Telehealth: Payer: Self-pay | Admitting: Internal Medicine

## 2015-10-29 ENCOUNTER — Emergency Department (HOSPITAL_BASED_OUTPATIENT_CLINIC_OR_DEPARTMENT_OTHER): Payer: BLUE CROSS/BLUE SHIELD

## 2015-10-29 ENCOUNTER — Inpatient Hospital Stay (HOSPITAL_BASED_OUTPATIENT_CLINIC_OR_DEPARTMENT_OTHER)
Admission: EM | Admit: 2015-10-29 | Discharge: 2015-11-02 | DRG: 373 | Disposition: A | Discharge status: Home / Self Care | Payer: BLUE CROSS/BLUE SHIELD | Attending: Internal Medicine | Admitting: Internal Medicine

## 2015-10-29 DIAGNOSIS — A047 Enterocolitis due to Clostridium difficile: Secondary | ICD-10-CM | POA: Diagnosis present

## 2015-10-29 DIAGNOSIS — Z833 Family history of diabetes mellitus: Secondary | ICD-10-CM | POA: Diagnosis not present

## 2015-10-29 DIAGNOSIS — I10 Essential (primary) hypertension: Secondary | ICD-10-CM | POA: Diagnosis present

## 2015-10-29 DIAGNOSIS — K219 Gastro-esophageal reflux disease without esophagitis: Secondary | ICD-10-CM | POA: Diagnosis present

## 2015-10-29 DIAGNOSIS — Z7982 Long term (current) use of aspirin: Secondary | ICD-10-CM | POA: Diagnosis not present

## 2015-10-29 DIAGNOSIS — N400 Enlarged prostate without lower urinary tract symptoms: Secondary | ICD-10-CM | POA: Diagnosis present

## 2015-10-29 DIAGNOSIS — J45909 Unspecified asthma, uncomplicated: Secondary | ICD-10-CM | POA: Diagnosis present

## 2015-10-29 DIAGNOSIS — E860 Dehydration: Secondary | ICD-10-CM | POA: Diagnosis present

## 2015-10-29 DIAGNOSIS — Z79899 Other long term (current) drug therapy: Secondary | ICD-10-CM

## 2015-10-29 DIAGNOSIS — Z8673 Personal history of transient ischemic attack (TIA), and cerebral infarction without residual deficits: Secondary | ICD-10-CM

## 2015-10-29 DIAGNOSIS — A0472 Enterocolitis due to Clostridium difficile, not specified as recurrent: Secondary | ICD-10-CM

## 2015-10-29 DIAGNOSIS — Z8249 Family history of ischemic heart disease and other diseases of the circulatory system: Secondary | ICD-10-CM | POA: Diagnosis not present

## 2015-10-29 DIAGNOSIS — F17210 Nicotine dependence, cigarettes, uncomplicated: Secondary | ICD-10-CM | POA: Diagnosis present

## 2015-10-29 DIAGNOSIS — Z96611 Presence of right artificial shoulder joint: Secondary | ICD-10-CM | POA: Diagnosis present

## 2015-10-29 DIAGNOSIS — I2510 Atherosclerotic heart disease of native coronary artery without angina pectoris: Secondary | ICD-10-CM | POA: Diagnosis present

## 2015-10-29 DIAGNOSIS — E042 Nontoxic multinodular goiter: Secondary | ICD-10-CM | POA: Diagnosis present

## 2015-10-29 DIAGNOSIS — E039 Hypothyroidism, unspecified: Secondary | ICD-10-CM | POA: Diagnosis present

## 2015-10-29 DIAGNOSIS — Z809 Family history of malignant neoplasm, unspecified: Secondary | ICD-10-CM

## 2015-10-29 DIAGNOSIS — R1084 Generalized abdominal pain: Secondary | ICD-10-CM | POA: Diagnosis not present

## 2015-10-29 DIAGNOSIS — R109 Unspecified abdominal pain: Secondary | ICD-10-CM | POA: Diagnosis present

## 2015-10-29 DIAGNOSIS — E785 Hyperlipidemia, unspecified: Secondary | ICD-10-CM | POA: Diagnosis present

## 2015-10-29 LAB — CBC WITH DIFFERENTIAL/PLATELET
Basophils Absolute: 0 10*3/uL (ref 0.0–0.1)
Basophils Relative: 0 %
Eosinophils Absolute: 0.1 10*3/uL (ref 0.0–0.7)
Eosinophils Relative: 0 %
HCT: 46.6 % (ref 39.0–52.0)
Hemoglobin: 16.7 g/dL (ref 13.0–17.0)
Lymphocytes Relative: 17 %
Lymphs Abs: 2.5 10*3/uL (ref 0.7–4.0)
MCH: 30.9 pg (ref 26.0–34.0)
MCHC: 35.8 g/dL (ref 30.0–36.0)
MCV: 86.1 fL (ref 78.0–100.0)
Monocytes Absolute: 1 10*3/uL (ref 0.1–1.0)
Monocytes Relative: 7 %
Neutro Abs: 10.8 10*3/uL — ABNORMAL HIGH (ref 1.7–7.7)
Neutrophils Relative %: 76 %
Platelets: 205 10*3/uL (ref 150–400)
RBC: 5.41 MIL/uL (ref 4.22–5.81)
RDW: 13.3 % (ref 11.5–15.5)
WBC: 14.3 10*3/uL — ABNORMAL HIGH (ref 4.0–10.5)

## 2015-10-29 LAB — BASIC METABOLIC PANEL
Anion gap: 10 (ref 5–15)
BUN: 11 mg/dL (ref 6–20)
CO2: 23 mmol/L (ref 22–32)
Calcium: 10.3 mg/dL (ref 8.9–10.3)
Chloride: 105 mmol/L (ref 101–111)
Creatinine, Ser: 0.75 mg/dL (ref 0.61–1.24)
GFR calc Af Amer: >60 mL/min (ref >60)
GFR calc non Af Amer: >60 mL/min (ref >60)
Glucose, Bld: 103 mg/dL — ABNORMAL HIGH (ref 65–99)
Potassium: 3.9 mmol/L (ref 3.5–5.1)
Sodium: 138 mmol/L (ref 135–145)

## 2015-10-29 LAB — C DIFFICILE QUICK SCREEN W PCR REFLEX
C Diff antigen: NEGATIVE
C Diff interpretation: NOT DETECTED
C Diff toxin: NEGATIVE

## 2015-10-29 MED ORDER — METOCLOPRAMIDE HCL 5 MG/ML IJ SOLN
10.0000 mg | Freq: Once | INTRAMUSCULAR | Status: AC
  Administered 2015-10-29: 10 mg via INTRAVENOUS
  Filled 2015-10-29: qty 2

## 2015-10-29 MED ORDER — ONDANSETRON HCL 4 MG/2ML IJ SOLN
4.0000 mg | Freq: Once | INTRAMUSCULAR | Status: AC
  Administered 2015-10-29: 4 mg via INTRAVENOUS
  Filled 2015-10-29: qty 2

## 2015-10-29 MED ORDER — VANCOMYCIN 50 MG/ML ORAL SOLUTION
125.0000 mg | Freq: Four times a day (QID) | ORAL | Status: DC
  Administered 2015-10-30 – 2015-11-02 (×13): 125 mg via ORAL
  Filled 2015-10-29 (×16): qty 2.5

## 2015-10-29 MED ORDER — ENOXAPARIN SODIUM 40 MG/0.4ML ~~LOC~~ SOLN
40.0000 mg | Freq: Every day | SUBCUTANEOUS | Status: DC
Start: 2015-10-30 — End: 2015-11-02
  Administered 2015-10-30 – 2015-11-01 (×3): 40 mg via SUBCUTANEOUS
  Filled 2015-10-29 (×4): qty 0.4

## 2015-10-29 MED ORDER — LOPERAMIDE HCL 2 MG PO CAPS
2.0000 mg | ORAL_CAPSULE | Freq: Once | ORAL | Status: AC
  Administered 2015-10-29: 2 mg via ORAL
  Filled 2015-10-29: qty 1

## 2015-10-29 MED ORDER — SODIUM CHLORIDE 0.9 % IV BOLUS (SEPSIS)
2000.0000 mL | Freq: Once | INTRAVENOUS | Status: AC
  Administered 2015-10-29: 2000 mL via INTRAVENOUS

## 2015-10-29 MED ORDER — PROMETHAZINE HCL 25 MG/ML IJ SOLN
12.5000 mg | INTRAMUSCULAR | Status: DC | PRN
  Administered 2015-10-29 – 2015-11-01 (×8): 12.5 mg via INTRAVENOUS
  Filled 2015-10-29 (×9): qty 1

## 2015-10-29 MED ORDER — MORPHINE SULFATE (PF) 4 MG/ML IV SOLN
4.0000 mg | Freq: Once | INTRAVENOUS | Status: AC
  Administered 2015-10-29: 4 mg via INTRAVENOUS
  Filled 2015-10-29: qty 1

## 2015-10-29 MED ORDER — METRONIDAZOLE IN NACL 5-0.79 MG/ML-% IV SOLN
500.0000 mg | Freq: Three times a day (TID) | INTRAVENOUS | Status: DC
  Administered 2015-10-30 – 2015-11-01 (×8): 500 mg via INTRAVENOUS
  Filled 2015-10-29 (×8): qty 100

## 2015-10-29 MED ORDER — LOPERAMIDE HCL 2 MG PO CAPS
4.0000 mg | ORAL_CAPSULE | Freq: Once | ORAL | Status: AC
  Administered 2015-10-29: 4 mg via ORAL
  Filled 2015-10-29: qty 2

## 2015-10-29 MED ORDER — FAMOTIDINE IN NACL 20-0.9 MG/50ML-% IV SOLN
20.0000 mg | Freq: Two times a day (BID) | INTRAVENOUS | Status: DC
  Administered 2015-10-30 – 2015-10-31 (×4): 20 mg via INTRAVENOUS
  Filled 2015-10-29 (×5): qty 50

## 2015-10-29 MED ORDER — DEXTROSE-NACL 5-0.45 % IV SOLN
INTRAVENOUS | Status: DC
  Administered 2015-10-29 – 2015-10-31 (×4): via INTRAVENOUS

## 2015-10-29 MED ORDER — IOPAMIDOL (ISOVUE-300) INJECTION 61%
100.0000 mL | Freq: Once | INTRAVENOUS | Status: AC | PRN
  Administered 2015-10-29: 100 mL via INTRAVENOUS

## 2015-10-29 MED ORDER — MORPHINE SULFATE (PF) 2 MG/ML IV SOLN
2.0000 mg | INTRAVENOUS | Status: DC | PRN
  Administered 2015-10-30 – 2015-10-31 (×9): 2 mg via INTRAVENOUS
  Filled 2015-10-29 (×9): qty 1

## 2015-10-29 NOTE — ED Notes (Signed)
Attempted to call report a second time and RN still not available.

## 2015-10-29 NOTE — ED Notes (Signed)
Attempt to start IV in right hand unsuccessful.

## 2015-10-29 NOTE — Progress Notes (Signed)
55 yo M with pmhx sig for HTN, nontoxic thyroid goiter, TIA, anxiety, and recent admits x2 with gastroenteritis, seen by GI both times, sent home on PO vanc by GI 5 days ago presents with vomiting, diarrhea "every 5 minutes" and ran out of anti-nausea medicine.  BP 121/89 mmHg  Pulse 81  Temp(Src) 98.5 F (36.9 C) (Oral)  Resp 22  Ht 5\' 10"  (1.778 m)  Wt 92.534 kg (204 lb)  BMI 29.27 kg/m2  SpO2 100%  Na 138 K 3.9 Cr 0.75 BUN 11 WBC 14.3K  CT abdomen pelvis negative But Can't take anything by mouth  To med surg OBS for symptom control.  Has been hydrated 2L already.

## 2015-10-29 NOTE — ED Provider Notes (Signed)
CSN: [id number]     Arrival date & time 10/29/15  1431 History   First MD Initiated Contact with Patient 10/29/15 1457     Chief Complaint  Patient presents with  . Vomiting     (Consider location/radiation/quality/duration/timing/severity/associated sxs/prior Treatment) HPI Complains of crampy lower abdominal pain onset 6 AM today accompanied by yellowish diarrhea every 10 minutes since 6 AM today, nausea and he's vomited one time. No fever. No treatment prior to coming here. No other associated symptoms. Nothing makes symptoms better or worse. He was discharged from [facility] 7 05/27/2015 was discharged on vancomycin oral which he is continuing to take. He has run out of his antiemetic prescribed Past Medical History  Diagnosis Date  . Hypertension   . Unspecified asthma(493.90)   . Chest pain, atypical   . Nontoxic multinodular goiter   . Dyspnea on exertion   . Trigeminal neuralgia   . Cluster headache   . Hyperlipidemia   . GERD (gastroesophageal reflux disease)   . Complication of anesthesia     difficult waking up"  . Hiatal hernia   . Pulmonary nodule --- CT 10/19/2014: Nodule is stable, no further routine x-rays 01/06/2009  . Hypothyroidism   . TIA (transient ischemic attack)   . BPH (benign prostatic hyperplasia)   . Drug abuse    Past Surgical History  Procedure Laterality Date  . Appendectomy    . Knee arthroscopy    . 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 Right shoulder redo-reconstruction  06/21/10  . Destruction trigeminal nerve via neurolytic agent  x 3 , R side last ~2010  . Cardiac catheterization N/A 11/25/2014    Procedure: Left Heart Cath and Coronary Angiography;  Surgeon: Belva Crome, MD;  Location: [facility];  Service: Cardiovascular;  Laterality: N/A;   Family History  Problem Relation Age of Onset  . Diabetes Mother   . Hyperlipidemia Mother   . Hypertension Mother   . Hypertension Father   .  Heart disease Father     CAD/CHF  . Cancer Father     Mesothelioma  . Heart disease Maternal Aunt   . Prostate cancer Neg Hx   . Colon cancer Neg Hx    Social History  Substance Use Topics  . Smoking status: Current Every Day Smoker -- 0.25 packs/day for 18 years    Types: Cigarettes  . Smokeless tobacco: Never Used     Comment: 07/10/13 1/2 ppd  . Alcohol Use: No    Review of Systems  Gastrointestinal: Positive for nausea, vomiting, abdominal pain and diarrhea.  All other systems reviewed and are negative.     Allergies  Review of patient's allergies indicates no known allergies.  Home Medications   Prior to Admission medications   Medication Sig Start Date End Date Taking? Authorizing Provider  amLODipine (NORVASC) 10 MG tablet Take 1 tablet (10 mg total) by mouth daily. 09/29/15   Colon Branch, MD  aspirin EC 81 MG EC tablet Take 1 tablet (81 mg total) by mouth daily. 09/24/15   Ripudeep Krystal Eaton, MD  atorvastatin (LIPITOR) 20 MG tablet Take 1 tablet (20 mg total) by mouth at bedtime. 09/29/15   Colon Branch, MD  famotidine (PEPCID) 40 MG tablet Take 1 tablet (40 mg total) by mouth daily. 10/24/15   Karmen Bongo, MD  feeding supplement, ENSURE ENLIVE, (ENSURE ENLIVE) LIQD Take 237 mLs by mouth 2 (two) times  daily between meals. 10/24/15   Karmen Bongo, MD  losartan (COZAAR) 50 MG tablet Take 1 tablet (50 mg total) by mouth daily. 09/29/15   Colon Branch, MD  promethazine (PHENERGAN) 25 MG tablet Take 1 tablet (25 mg total) by mouth every 6 (six) hours as needed for nausea or vomiting. 09/24/15   Ripudeep Krystal Eaton, MD  saccharomyces boulardii (FLORASTOR) 250 MG capsule Take 1 capsule (250 mg total) by mouth 2 (two) times daily. 10/24/15   Karmen Bongo, MD  vancomycin (VANCOCIN) 125 MG capsule Take 1 capsule (125 mg total) by mouth 2 (two) times daily. 10/24/15 11/05/15  Karmen Bongo, MD   BP 112/95 mmHg  Pulse 99  Temp(Src) 99 F (37.2 C) (Oral)  Resp 20  Ht 5\' 10"  (1.778 m)  Wt 204 lb  (92.534 kg)  BMI 29.27 kg/m2  SpO2 99% Physical Exam  Constitutional: He appears well-developed and well-nourished. He appears distressed.  Mildly ill-appearing  HENT:  Head: Normocephalic and atraumatic.  Mucous membranes dry  Eyes: Conjunctivae are normal. Pupils are equal, round, and reactive to light.  Neck: Neck supple. No tracheal deviation present. No thyromegaly present.  Cardiovascular: Normal rate and regular rhythm.   No murmur heard. Pulmonary/Chest: Effort normal and breath sounds normal.  Abdominal: Soft. Bowel sounds are normal. He exhibits no distension. There is no tenderness.  And for umbilical surgical scar  Genitourinary: Penis normal.  Musculoskeletal: Normal range of motion. He exhibits no edema or tenderness.  Neurological: He is alert. Coordination normal.  Skin: Skin is warm and dry. No rash noted.  Psychiatric: He has a normal mood and affect.  Nursing note and vitals reviewed.   ED Course  Procedures (including critical care time) Labs Review Labs Reviewed - No data to display  Imaging Review No results found. I have personally reviewed and evaluated these images and lab results as part of my medical decision-making.   EKG Interpretation None     4:40 PM cramps improved after treatment with intravenous opioids. Nausea is improved however still complains of nausea and requesting other antiemetic.  X-rays viewed by me 8:15 PM after treatment with multiple doses of intravenous antiemetics, intravenous opioids, intravenous fluids and oral Imodium patient continues to complain of nausea, diarrhea and crampy abdominal pain.Marland Kitchen He is unable to drink without severe nausea and does not feel well enough to go home X-rays viewed by me Results for orders placed or performed during the hospital encounter of 10/29/15  C difficile quick scan w PCR reflex  Result Value Ref Range   C Diff antigen NEGATIVE NEGATIVE   C Diff toxin NEGATIVE NEGATIVE   C Diff  interpretation No C. difficile detected.   Basic metabolic panel  Result Value Ref Range   Sodium 138 135 - 145 mmol/L   Potassium 3.9 3.5 - 5.1 mmol/L   Chloride 105 101 - 111 mmol/L   CO2 23 22 - 32 mmol/L   Glucose, Bld 103 (H) 65 - 99 mg/dL   BUN 11 6 - 20 mg/dL   Creatinine, Ser 0.75 0.61 - 1.24 mg/dL   Calcium 10.3 8.9 - 10.3 mg/dL   GFR calc non Af Amer >60 >60 mL/min   GFR calc Af Amer >60 >60 mL/min   Anion gap 10 5 - 15  CBC with Differential/Platelet  Result Value Ref Range   WBC 14.3 (H) 4.0 - 10.5 K/uL   RBC 5.41 4.22 - 5.81 MIL/uL   Hemoglobin 16.7 13.0 - 17.0 g/dL  HCT 46.6 39.0 - 52.0 %   MCV 86.1 78.0 - 100.0 fL   MCH 30.9 26.0 - 34.0 pg   MCHC 35.8 30.0 - 36.0 g/dL   RDW 13.3 11.5 - 15.5 %   Platelets 205 150 - 400 K/uL   Neutrophils Relative % 76 %   Neutro Abs 10.8 (H) 1.7 - 7.7 K/uL   Lymphocytes Relative 17 %   Lymphs Abs 2.5 0.7 - 4.0 K/uL   Monocytes Relative 7 %   Monocytes Absolute 1.0 0.1 - 1.0 K/uL   Eosinophils Relative 0 %   Eosinophils Absolute 0.1 0.0 - 0.7 K/uL   Basophils Relative 0 %   Basophils Absolute 0.0 0.0 - 0.1 K/uL   US Soft Tissue Head/neck  10/01/2015  CLINICAL DATA:  Goiter EXAM: THYROID ULTRASOUND TECHNIQUE: Ultrasound examination of the thyroid gland and adjacent soft tissues was performed. COMPARISON:  08/17/2009 FINDINGS: Right thyroid lobe Measurements: 5.7 x 2.3 x 2.5 cm. Solid isoechoic nodule in the lower pole measures 1.7 x 1.4 x 1.6 cm. Previously, this measured 1.6 x 1.2 x 1.1 cm. 0.6 cm mid lobe nodule. Left thyroid lobe Measurements: 7.8 x 3.8 x 3.5 cm. Lower pole isoechoic solid nodule measures 3.0 x 3.0 x 2.5 cm. Mid lobe nodule measures 1.7 x 1.8 x 1.7 cm. Previously, the largest nodule on the left measured 2.0 cm. Isthmus Thickness: 0.8 cm.  No nodules visualized. Lymphadenopathy None visualized. IMPRESSION: Multiple bilateral nodules are again noted. The dominant nodule in the left lower pole has enlarged common now  measuring 3.0 cm. Findings meet consensus criteria for biopsy. Ultrasound-guided fine needle aspiration should be considered, as per the consensus statement: Management of Thyroid Nodules Detected at [place]: Society of Radiologists in [place]. Radiology 2005; [id number]. Electronically Signed   By: Marybelle Killings M.D.   On: 10/01/2015 14:37   Ct Abdomen Pelvis W Contrast  10/29/2015  CLINICAL DATA:  Concern for small bowel obstruction. History of bowel obstructions teenager following appendectomy. Elevated white blood cell count. Recent C difficile infection. EXAM: CT ABDOMEN AND PELVIS WITH CONTRAST TECHNIQUE: Multidetector CT imaging of the abdomen and pelvis was performed using the standard protocol following bolus administration of intravenous contrast. CONTRAST:  12mL ISOVUE-300 IOPAMIDOL (ISOVUE-300) INJECTION 61% COMPARISON:  CT 10/20/2015 FINDINGS: Lower chest: Lung bases are clear. Hepatobiliary: No focal hepatic lesion. No biliary duct dilatation. Gallbladder is normal. Common bile duct is normal. Pancreas: Pancreas is normal. No ductal dilatation. No pancreatic inflammation. Spleen: Normal spleen Adrenals/urinary tract: Adrenal glands are normal. Potential enhancing lesion in the mid LEFT kidney measuring 16 mm (image 30, series 7) is not changed. No hydronephrosis or hydroureter. Calcification in the distal LEFT ureter / vesicoureteral junction is not changed. No bladder calculi. Stomach/Bowel: Stomach, duodenum, small-bowel cecum are normal. Appendix is not identified. Colon and rectosigmoid colon are normal. No evidence of bowel inflammation or obstruction. There is fluid stool within the descending colon rectum. Vascular/Lymphatic: Abdominal aorta is normal caliber with atherosclerotic calcification. There is no retroperitoneal or periportal lymphadenopathy. No pelvic lymphadenopathy. Reproductive: Prostate normal Other: Small umbilical hernia.  No inguinal hernia  Musculoskeletal: No aggressive osseous lesion. IMPRESSION: 1. No evidence of bowel obstruction. 2. Fluid stool within the rectosigmoid colon. 3. No clear evidence of bowel inflammation. 4. Stable calculus at the LEFT vesicoureteral junction without evidence obstruction. 5. Stable enhancing lesion the mid LEFT kidney. Electronically Signed   By: Suzy Bouchard M.D.   On: 10/29/2015 17:44   Ct  Abdomen Pelvis W Contrast  10/20/2015  CLINICAL DATA:  Diffuse abdominal pain with nausea, vomiting, and diarrhea. EXAM: CT ABDOMEN AND PELVIS WITH CONTRAST TECHNIQUE: Multidetector CT imaging of the abdomen and pelvis was performed using the standard protocol following bolus administration of intravenous contrast. CONTRAST:  113mL ISOVUE-300 IOPAMIDOL (ISOVUE-300) INJECTION 61% COMPARISON:  09/22/2015 FINDINGS: Lower chest:  Unremarkable. Hepatobiliary: No focal abnormality within the liver parenchyma. There is no evidence for gallstones, gallbladder wall thickening, or pericholecystic fluid. No intrahepatic or extrahepatic biliary dilation. Pancreas: No focal mass lesion. No dilatation of the main duct. No intraparenchymal cyst. No peripancreatic edema. Spleen: Tiny hypodensity in the spleen is stable and likely represents a cyst or pseudocyst. Adrenals/Urinary Tract: No adrenal nodule or mass. Right kidney is unremarkable. Again noted in the interpolar region of the left kidney is a 1.9 x 1.7 cm lesion with heterogeneous attenuation and potential enhancement. Also in the interpolar left kidney is a second lesion measuring 2.4 cm. This lesion has homogeneous attenuation and measures water density, compatible with a simple cyst. No evidence for hydroureter. Despite the lack of hydroureter, the patient has an 8 x 5 mm stone in the left UVJ. This stone was seen in the left kidney on an exam from 12/07/2011. Stomach/Bowel: Stomach is nondistended. No gastric wall thickening. No evidence of outlet obstruction. Duodenum is  normally positioned as is the ligament of Treitz. No small bowel wall thickening. No small bowel dilatation. The terminal ileum is normal. No gross colonic mass. No colonic wall thickening. No substantial diverticular change. Vascular/Lymphatic: There is abdominal aortic atherosclerosis without aneurysm. There is no gastrohepatic or hepatoduodenal ligament lymphadenopathy. No intraperitoneal or retroperitoneal lymphadenopathy. No pelvic sidewall lymphadenopathy. Reproductive: The prostate gland and seminal vesicles have normal imaging features. Other: No intraperitoneal free fluid. Musculoskeletal: Small umbilical hernia contains only fat. 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 8 x 5 mm stone in the region of the left UVJ is probably in the left UVJ. The patient had a stone of similar size in the left kidney on 12/07/2011 which was not present in the kidney on 09/22/2015, suggesting interval migration between those 2 studies. It is somewhat unusual that there is no left hydroureteronephrosis given the size of this stone and stone projects essentially in the bladder wall but on sagittal reformations does appear to be just into the lumen of the bladder. Given the relative stability in this appearance since 09/22/2015, this is not an acute finding on today's study. There is no abnormal perfusion in the left kidney to suggest obstructive uropathy. 2. Otherwise no acute findings on today's exam. 3. Abdominal aortic atherosclerosis. Electronically Signed   By: Misty Stanley M.D.   On: 10/20/2015 19:56   US Thyroid Biopsy  10/06/2015  INDICATION: 55 year old male with enlarging left lower thyroid nodule. EXAM: ULTRASOUND GUIDED NEEDLE ASPIRATE BIOPSY OF THE THYROID GLAND COMPARISON:  Prior thyroid ultrasound 10/01/2015 MEDICATIONS: 1% lidocaine COMPLICATIONS: None immediate. PROCEDURE: Informed written consent was obtained from the patient after a thorough discussion of  the procedural risks, benefits and alternatives. All questions were addressed. Maximal Sterile Barrier Technique was utilized including caps, mask, sterile gowns, sterile gloves, sterile drape, hand hygiene and skin antiseptic. A timeout was performed prior to the initiation of the procedure. Ultrasound was performed to localize and mark an adequate site for the biopsy. The patient was then prepped and draped in a normal sterile fashion. Local anesthesia was provided with 1% lidocaine. Using direct  ultrasound guidance, 4 passes were made using needles into the nodule within the left lobe of the thyroid. Ultrasound was used to confirm needle placements on all occasions. Specimens were sent to Pathology for analysis. IMPRESSION: Ultrasound guided needle aspirate biopsy performed of the left thyroid nodule. Electronically Signed   By: Jacqulynn Cadet M.D.   On: 10/06/2015 17:00   Dg Abd Acute W/chest  10/29/2015  CLINICAL DATA:  One day history of vomiting and diarrhea ; lower abdominal pain EXAM: DG ABDOMEN ACUTE W/ 1V CHEST COMPARISON:  Chest CT October 19, 2014; chest CT October 19, 2014; CT abdomen and pelvis October 20, 2015 FINDINGS: PA chest: There is no edema or consolidation. Heart size and pulmonary vascularity are normal. No adenopathy. Patient is status post right total shoulder replacement. Supine and upright abdomen: There is no appreciable bowel dilatation. There are multiple air-fluid levels, however. No free air. Multiple pelvic calcifications are consistent with phleboliths. IMPRESSION: Scattered air-fluid levels. Differential considerations include early bowel obstruction, ileus, and enteritis. Note that there is no appreciable bowel dilatation. No free air evident. Lungs clear. Electronically Signed   By: Lowella Grip III M.D.   On: 10/29/2015 16:08    MDM  Dr. Loleta Books consulted and accepts patient in transfer to [place] 23 hour observation, symptomatic control. Diagnoses #1  abdominal pain #2 nausea vomiting diarrhea Final diagnoses:  None        Orlie Dakin, MD 10/29/15 2035

## 2015-10-29 NOTE — Telephone Encounter (Signed)
[facility] - Client TELEPHONE ADVICE RECORD [facility]  Patient Name: [name]  DOB: Dec 04, 1960    Initial Comment Caller states patient has vomiting and diarrhea   Nurse Assessment  Nurse: Wayne Sever, RN, Tillie Rung Date/Time (Eastern Time): 10/29/2015 2:07:15 PM  Confirm and document reason for call. If symptomatic, describe symptoms. You must click the next button to save text entered. ---Wife states he is vomiting and diarrhea. She states he just got released from hospital with the same thing on Sunday. She states they did not get a diagnosis, no one knows why this is going on. He was also in the hospital at the beginning of June also.  Has the patient traveled out of the country within the last 30 days? ---No  Does the patient have any new or worsening symptoms? ---Yes  Will a triage be completed? ---Yes  Related visit to physician within the last 2 weeks? ---Yes  Does the PT have any chronic conditions? (i.e. diabetes, asthma, etc.) ---Yes  List chronic conditions. ---Thyroid Issues, HTN,  Is this a behavioral health or substance abuse call? ---No     Guidelines    Guideline Title Affirmed Question Affirmed Notes  Vomiting [1] Drinking very little AND [2] dehydration suspected (e.g., no urine > 12 hours, very dry mouth, very lightheaded)    Final Disposition User   Go to ED Now (or PCP triage) Wayne Sever, RN, [facility] - ED   Disagree/Comply: Comply

## 2015-10-29 NOTE — ED Notes (Signed)
Patient transported to CT 

## 2015-10-29 NOTE — Telephone Encounter (Signed)
Patient called stating that he was discharged from the hospital on Sunday and is still having the nausea, vomiting and diarrhea. Transferred to [facility]. Spoke with Caryl Pina.

## 2015-10-29 NOTE — Telephone Encounter (Signed)
Per chart, pt went to the ER as advised.   

## 2015-10-29 NOTE — ED Notes (Signed)
N/v/d since 6am-recent hosp at [facility] d/c Sunday-pt from registration to [facility] prior to triage per his request-wife with pt

## 2015-10-29 NOTE — ED Notes (Signed)
Attempted to call report to 5W. RN was unavailable to receive report. Left call back number for them to call when available.

## 2015-10-29 NOTE — Telephone Encounter (Signed)
Noted  

## 2015-10-30 DIAGNOSIS — A0472 Enterocolitis due to Clostridium difficile, not specified as recurrent: Secondary | ICD-10-CM

## 2015-10-30 DIAGNOSIS — R1084 Generalized abdominal pain: Secondary | ICD-10-CM

## 2015-10-30 DIAGNOSIS — I10 Essential (primary) hypertension: Secondary | ICD-10-CM

## 2015-10-30 DIAGNOSIS — A047 Enterocolitis due to Clostridium difficile: Principal | ICD-10-CM

## 2015-10-30 LAB — COMPREHENSIVE METABOLIC PANEL
ALT: 11 U/L — ABNORMAL LOW (ref 17–63)
AST: 14 U/L — ABNORMAL LOW (ref 15–41)
Albumin: 3.3 g/dL — ABNORMAL LOW (ref 3.5–5.0)
Alkaline Phosphatase: 36 U/L — ABNORMAL LOW (ref 38–126)
Anion gap: 8 (ref 5–15)
BUN: 9 mg/dL (ref 6–20)
CO2: 24 mmol/L (ref 22–32)
Calcium: 9.3 mg/dL (ref 8.9–10.3)
Chloride: 106 mmol/L (ref 101–111)
Creatinine, Ser: 0.87 mg/dL (ref 0.61–1.24)
GFR calc Af Amer: >60 mL/min (ref >60)
GFR calc non Af Amer: >60 mL/min (ref >60)
Glucose, Bld: 130 mg/dL — ABNORMAL HIGH (ref 65–99)
Potassium: 3.9 mmol/L (ref 3.5–5.1)
Sodium: 138 mmol/L (ref 135–145)
Total Bilirubin: 0.5 mg/dL (ref 0.3–1.2)
Total Protein: 6.5 g/dL (ref 6.5–8.1)

## 2015-10-30 LAB — GASTROINTESTINAL PANEL BY PCR, STOOL (REPLACES STOOL CULTURE)

## 2015-10-30 LAB — CBC
HCT: 42.3 % (ref 39.0–52.0)
Hemoglobin: 14.4 g/dL (ref 13.0–17.0)
MCH: 30.1 pg (ref 26.0–34.0)
MCHC: 34 g/dL (ref 30.0–36.0)
MCV: 88.3 fL (ref 78.0–100.0)
Platelets: 208 10*3/uL (ref 150–400)
RBC: 4.79 MIL/uL (ref 4.22–5.81)
RDW: 13.7 % (ref 11.5–15.5)
WBC: 13.5 10*3/uL — ABNORMAL HIGH (ref 4.0–10.5)

## 2015-10-30 MED ORDER — CETYLPYRIDINIUM CHLORIDE 0.05 % MT LIQD
7.0000 mL | Freq: Two times a day (BID) | OROMUCOSAL | Status: DC
  Administered 2015-10-30 – 2015-11-02 (×7): 7 mL via OROMUCOSAL

## 2015-10-30 MED ORDER — ONDANSETRON HCL 4 MG/2ML IJ SOLN
4.0000 mg | Freq: Four times a day (QID) | INTRAMUSCULAR | Status: DC | PRN
  Administered 2015-10-30 – 2015-11-01 (×8): 4 mg via INTRAVENOUS
  Filled 2015-10-30 (×8): qty 2

## 2015-10-30 MED ORDER — BOOST / RESOURCE BREEZE PO LIQD: 1.0000 | Freq: Three times a day (TID) | ORAL | Status: DC

## 2015-10-30 NOTE — Progress Notes (Signed)
Admitted pt.from [facility] ED,alert,oriented x 4;no acute distress noted.V/S taken & recorded.IV in place on rt.FA,no redness ,swelling noted.Oriented pt.to the room ,call bell& information packet given to pt.Admission inpt.armband verified w/ pt.& applied.Fall assessment completed w/ pt. & able to verbalized understanding of risks associated w/ falls.& verbalized understanding to call nurse before getting out of bed.Call light with in reach Skin dry & intact.Will continue to monitor pt.

## 2015-10-30 NOTE — H&P (Signed)
History and Physical  Zeid Ruoff [id number] DOB: 01-19-1961 DOA: 10/29/2015  PCP:  Kathlene November, MD   Chief Complaint:  Abdominal pain   History of Present Illness:  Patient is a 55 yo male with history of recurrent admissions for severe abdominal pain, vomiting and diarrhea. The symptoms started a month ago when he was diagnosed with gastroenteritis and treated with cipro then he started getting severe pain with watery diarrhea that warranted admissions with recent discharge 6 days ago to continue treatment with PO vancomycin. The patient thinks he was taken Flagyl. He said after discharge he did well for 4 days then started having symptoms 2 days ago with vomiting twice daily and diarrhea several times daily, watery with no blood. Symptoms with chills and fatigue but without fever or hematemesis. No chest pain or dyspnea. No cough. No dysuria.   Review of Systems:  CONSTITUTIONAL:     No night sweats.  No fatigue.  No fever. No chills. Eyes:                            No visual changes.  No eye pain.  No eye discharge.   ENT:                              No epistaxis.  No sinus pain.  No sore throat.   No congestion. RESPIRATORY:           No cough.  No wheeze.  No hemoptysis.  No dyspnea CARDIOVASCULAR   :  No chest pains.  No palpitations. GASTROINTESTINAL:  +abdominal pain.  +nausea. +vomiting.  +diarrhea. No constipation.  No hematemesis.  No hematochezia.  No melena. GENITOURINARY:      No urgency.  No frequency.  No dysuria.  No hematuria.  No obstructive symptoms.  No discharge.  No pain.   MUSCULOSKELETAL:  No musculoskeletal pain.  No joint swelling.  No arthritis. NEUROLOGICAL:        No confusion.  No weakness. No headache. No seizure. PSYCHIATRIC:             No depression. No anxiety. No suicidal ideation. SKIN:                             No rashes.  No lesions.  No wounds. ENDOCRINE:                No weight loss.  No polydipsia.  No polyuria.  No  polyphagia. HEMATOLOGIC:           No purpura.  No petechiae.  No bleeding.  ALLERGIC                 : No pruritus.  No angioedema Other:  Past Medical and Surgical History:   Past Medical History  Diagnosis Date  . Hypertension   . Unspecified asthma(493.90)   . Chest pain, atypical   . Nontoxic multinodular goiter   . Dyspnea on exertion   . Trigeminal neuralgia   . Cluster headache   . Hyperlipidemia   . GERD (gastroesophageal reflux disease)   . Complication of anesthesia     difficult waking up"  . Hiatal hernia   . Pulmonary nodule --- CT 10/19/2014: Nodule is stable, no further routine x-rays 01/06/2009  . Hypothyroidism   . TIA (transient ischemic attack)   .  BPH (benign prostatic hyperplasia)   . Drug abuse    Past Surgical History  Procedure Laterality Date  . Appendectomy    . Knee arthroscopy    . 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 Right shoulder redo-reconstruction  06/21/10  . Destruction trigeminal nerve via neurolytic agent  x 3 , R side last ~2010  . Cardiac catheterization N/A 11/25/2014    Procedure: Left Heart Cath and Coronary Angiography;  Surgeon: Belva Crome, MD;  Location: [facility];  Service: Cardiovascular;  Laterality: N/A;    Social History:   reports that he has been smoking Cigarettes.  He has a 4.5 pack-year smoking history. He has never used smokeless tobacco. He reports that he does not drink alcohol or use illicit drugs.    No Known Allergies  Family History  Problem Relation Age of Onset  . Diabetes Mother   . Hyperlipidemia Mother   . Hypertension Mother   . Hypertension Father   . Heart disease Father     CAD/CHF  . Cancer Father     Mesothelioma  . Heart disease Maternal Aunt   . Prostate cancer Neg Hx   . Colon cancer Neg Hx       Prior to Admission medications   Medication Sig Start Date End Date Taking? Authorizing Provider  amLODipine (NORVASC) 10 MG tablet Take 1  tablet (10 mg total) by mouth daily. 09/29/15  Yes Colon Branch, MD  ARTIFICIAL TEAR SOLUTION OP Place 1-2 drops into the right eye See admin instructions. Up to ten times daily because eye doesn't make tears   Yes Historical Provider, MD  aspirin EC 81 MG EC tablet Take 1 tablet (81 mg total) by mouth daily. 09/24/15  Yes Ripudeep Krystal Eaton, MD  atorvastatin (LIPITOR) 20 MG tablet Take 1 tablet (20 mg total) by mouth at bedtime. 09/29/15  Yes Colon Branch, MD  losartan (COZAAR) 50 MG tablet Take 1 tablet (50 mg total) by mouth daily. 09/29/15  Yes Colon Branch, MD  promethazine (PHENERGAN) 25 MG tablet Take 1 tablet (25 mg total) by mouth every 6 (six) hours as needed for nausea or vomiting. 09/24/15  Yes Ripudeep Krystal Eaton, MD  vancomycin (VANCOCIN) 125 MG capsule Take 1 capsule (125 mg total) by mouth 2 (two) times daily. 10/24/15 11/05/15 Yes Karmen Bongo, MD  famotidine (PEPCID) 40 MG tablet Take 1 tablet (40 mg total) by mouth daily. Patient not taking: Reported on 10/29/2015 10/24/15   Karmen Bongo, MD  feeding supplement, ENSURE ENLIVE, (ENSURE ENLIVE) LIQD Take 237 mLs by mouth 2 (two) times daily between meals. Patient not taking: Reported on 10/29/2015 10/24/15   Karmen Bongo, MD  saccharomyces boulardii (FLORASTOR) 250 MG capsule Take 1 capsule (250 mg total) by mouth 2 (two) times daily. 10/24/15   Karmen Bongo, MD    Physical Exam: BP 133/79 mmHg  Pulse 89  Temp(Src) 98.8 F (37.1 C) (Oral)  Resp 18  Ht 5' 1.2" (1.554 m)  Wt 84.46 kg (186 lb 3.2 oz)  BMI 34.97 kg/m2  SpO2 100%  GENERAL :   Alert and cooperative, and appears to be in mild acute distress. HEAD:           normocephalic. EYES:            PERRL, EOMI.  vision is grossly intact. EARS:           hearing grossly intact.  NOSE:           No nasal discharge. THROAT:     Oral cavity and pharynx normal.   NECK:          supple, non-tender CARDIAC:    Normal S1 and S2. No gallop. No murmurs.  Vascular:     no peripheral edema. Extremities are  warm and well perfused. No carotid bruits. LUNGS:       Clear to auscultation  ABDOMEN: Positive bowel sounds. Soft, mildly distended, diffusely tender to palpation. No guarding or rebound.      MSK:           No joint erythema or tenderness. Normal muscular development. EXT           : No significant deformity or joint abnormality. Neuro        : Alert, oriented to person, place, and time.                      CN II-XII intact.  SKIN:            No rash. No lesions. PSYCH:       No hallucination. Patient is not suicidal.          Labs on Admission:  Reviewed.   Radiological Exams on Admission: Ct Abdomen Pelvis W Contrast  10/29/2015  CLINICAL DATA:  Concern for small bowel obstruction. History of bowel obstructions teenager following appendectomy. Elevated white blood cell count. Recent C difficile infection. EXAM: CT ABDOMEN AND PELVIS WITH CONTRAST TECHNIQUE: Multidetector CT imaging of the abdomen and pelvis was performed using the standard protocol following bolus administration of intravenous contrast. CONTRAST:  120mL ISOVUE-300 IOPAMIDOL (ISOVUE-300) INJECTION 61% COMPARISON:  CT 10/20/2015 FINDINGS: Lower chest: Lung bases are clear. Hepatobiliary: No focal hepatic lesion. No biliary duct dilatation. Gallbladder is normal. Common bile duct is normal. Pancreas: Pancreas is normal. No ductal dilatation. No pancreatic inflammation. Spleen: Normal spleen Adrenals/urinary tract: Adrenal glands are normal. Potential enhancing lesion in the mid LEFT kidney measuring 16 mm (image 30, series 7) is not changed. No hydronephrosis or hydroureter. Calcification in the distal LEFT ureter / vesicoureteral junction is not changed. No bladder calculi. Stomach/Bowel: Stomach, duodenum, small-bowel cecum are normal. Appendix is not identified. Colon and rectosigmoid colon are normal. No evidence of bowel inflammation or obstruction. There is fluid stool within the descending colon rectum. Vascular/Lymphatic:  Abdominal aorta is normal caliber with atherosclerotic calcification. There is no retroperitoneal or periportal lymphadenopathy. No pelvic lymphadenopathy. Reproductive: Prostate normal Other: Small umbilical hernia.  No inguinal hernia Musculoskeletal: No aggressive osseous lesion. IMPRESSION: 1. No evidence of bowel obstruction. 2. Fluid stool within the rectosigmoid colon. 3. No clear evidence of bowel inflammation. 4. Stable calculus at the LEFT vesicoureteral junction without evidence obstruction. 5. Stable enhancing lesion the mid LEFT kidney. Electronically Signed   By: Suzy Bouchard M.D.   On: 10/29/2015 17:44   Dg Abd Acute W/chest  10/29/2015  CLINICAL DATA:  One day history of vomiting and diarrhea ; lower abdominal pain EXAM: DG ABDOMEN ACUTE W/ 1V CHEST COMPARISON:  Chest CT October 19, 2014; chest CT October 19, 2014; CT abdomen and pelvis October 20, 2015 FINDINGS: PA chest: There is no edema or consolidation. Heart size and pulmonary vascularity are normal. No adenopathy. Patient is status post right total shoulder replacement. Supine and upright abdomen: There is no appreciable bowel dilatation. There are multiple air-fluid levels, however. No free air. Multiple pelvic calcifications are  consistent with phleboliths. IMPRESSION: Scattered air-fluid levels. Differential considerations include early bowel obstruction, ileus, and enteritis. Note that there is no appreciable bowel dilatation. No free air evident. Lungs clear. Electronically Signed   By: Lowella Grip III M.D.   On: 10/29/2015 16:08      Assessment/Plan  Abdominal pain / vomiting/ diarrhea:  Unclear etiology but thought to be noninfectious  Still considering infectious etiology as patient improved on PO vanc last admission Will restart PO vanc and flagyl IV Phenergan prn, morphine prn, IVF, Pepcid IV Patient may need longer stay with gradual introduction of diet Send for c.diff, stool culture, enterovirus panel.   HTN:  cont home meds.   CAD: Denies chest pain. Cardiac cath 2016 revealed nonobstructive mid and distal RCA plaquing less than 50%, normal LV systolic function with an ejection fraction of 50-55%. Recommended aggressive risk factor modification -Continue home aspirin  Input & Output: NA Lines & Tubes: PIV DVT prophylaxis: [place] enoxaparin  GI prophylaxis: Pepcid Consultants: NA Code Status: Full Family Communication: None at bedside Disposition Plan: Admit     Gennaro Africa M.D [facility]

## 2015-10-30 NOTE — Progress Notes (Signed)
PROGRESS NOTE    [name]  [id number] DOB: 1960/06/28 DOA: 10/29/2015 PCP: Kathlene November, MD   Brief Narrative:  [name] is a 55 year old gentleman with comorbidities, recently hospitalized from 10/12/2015 through 10/24/2015 represented with nausea, , vomiting, diarrhea. There is suspicion that symptoms could be related to C. difficile colitis. He was seen by GI during that hospitalization. He was discharged on vancomycin 125 mg. Overnight he presented to the emergency department with complaints of nausea, vomiting, diarrhea with symptoms getting significantly worse in the past 24 hours. Initial lab work revealed a white count of 14,300. He was started on vancomycin 125 mg by mouth every 6 hours along with IV Flagyl.  Assessment & Plan:   Active Problems:   Essential hypertension   Abdominal pain   Enteritis due to Clostridium difficile  1.  Suspected C. difficile colitis -[name] presents with complaints of worsening nausea, vomiting, diarrhea in the last 24 hours. He had a recent hospitalization where he was evaluated by GI and his symptoms were felt to be related to C. difficile colitis. At the time he seemed to show response to oral vancomycin -Initial workup revealed an elevated white count of 14,300 -He was started on IV Flagyl and continued on oral vancomycin -He reports feeling a little better on this mornings examination. Continue supportive care, symptomatic management, IV fluids, repeat labs in a.m.  2.  Dehydration -He presents with multiple episodes of nausea vomiting and diarrhea, clinically appears dry -Plan to continue running IV fluids  3.  History of hypothyroidism -On 10/20/2015 found to have a TSH of 0.2, with free T4 1.01 -Per records she is not on thyroid replacement therapy. TSH will need to be repeated in 3-4 weeks  4.  History of coronary artery disease -Year 2016 That Revealed Nonobstructive Coronary Artery Disease with Preserved Ejection Fraction  55%   DVT prophylaxis: Lovenox Code Status: Full code Family Communication:  Disposition Plan: Anticipate discharge home in the next 3 days  Consultants:     Procedures:     Antimicrobials:   Oral vancomycin  Flagyl   Subjective: Reports that diarrhea is slowing down, thinks he might be getting hungry  Objective: Filed Vitals:   10/29/15 2217 10/29/15 2219 10/30/15 0530 10/30/15 1242  BP:  133/79 132/69 145/87  Pulse:  89 76 70  Temp:  98.8 F (37.1 C) 98.4 F (36.9 C) 98.2 F (36.8 C)  TempSrc:   Oral   Resp:  18 18 19   Height: 5' 1.2" (1.554 m)     Weight: 84.46 kg (186 lb 3.2 oz)     SpO2:  100% 100% 97%    Intake/Output Summary (Last 24 hours) at 10/30/15 1459 Last data filed at 10/30/15 1300  Gross per 24 hour  Intake 1177.08 ml  Output    500 ml  Net 677.08 ml   Filed Weights   10/29/15 1439 10/29/15 2217  Weight: 92.534 kg (204 lb) 84.46 kg (186 lb 3.2 oz)    Examination:  General exam: Appears ill, though no acute distress awake and alert Respiratory system: Clear to auscultation. Respiratory effort normal. Cardiovascular system: S1 & S2 heard, RRR. No JVD, murmurs, rubs, gallops or clicks. No pedal edema. Gastrointestinal system: Has generalized abdominal pain Central nervous system: Alert and oriented. No focal neurological deficits. Extremities: Symmetric 5 x 5 power. Skin: No rashes, lesions or ulcers Psychiatry: Judgement and insight appear normal. Mood & affect appropriate.     Data Reviewed: I have personally  reviewed following labs and imaging studies  CBC:  Recent Labs Lab 10/24/15 0542 10/29/15 1520 10/30/15 0455  WBC 10.1 14.3* 13.5*  NEUTROABS 4.0 10.8*  --   HGB 15.4 16.7 14.4  HCT 45.3 46.6 42.3  MCV 88.5 86.1 88.3  PLT 156 205 [id number]   Basic Metabolic Panel:  Recent Labs Lab 10/24/15 0742 10/29/15 1520 10/30/15 0455  NA 135 138 138  K 3.4* 3.9 3.9  CL 104 105 106  CO2 24 23 24   GLUCOSE 98 103* 130*  BUN  18 11 9   CREATININE 1.11 0.75 0.87  CALCIUM 8.8* 10.3 9.3   GFR: Estimated Creatinine Clearance: 92 mL/min (by C-G formula based on Cr of 0.87). Liver Function Tests:  Recent Labs Lab 10/30/15 0455  AST 14*  ALT 11*  ALKPHOS 36*  BILITOT 0.5  PROT 6.5  ALBUMIN 3.3*   No results for input(s): LIPASE, AMYLASE in the last 168 hours. No results for input(s): AMMONIA in the last 168 hours. Coagulation Profile: No results for input(s): INR, PROTIME in the last 168 hours. Cardiac Enzymes: No results for input(s): CKTOTAL, CKMB, CKMBINDEX, TROPONINI in the last 168 hours. BNP (last 3 results) No results for input(s): PROBNP in the last 8760 hours. HbA1C: No results for input(s): HGBA1C in the last 72 hours. CBG: No results for input(s): GLUCAP in the last 168 hours. Lipid Profile: No results for input(s): CHOL, HDL, LDLCALC, TRIG, CHOLHDL, LDLDIRECT in the last 72 hours. Thyroid Function Tests: No results for input(s): TSH, T4TOTAL, FREET4, T3FREE, THYROIDAB in the last 72 hours. Anemia Panel: No results for input(s): VITAMINB12, FOLATE, FERRITIN, TIBC, IRON, RETICCTPCT in the last 72 hours. Sepsis Labs: No results for input(s): PROCALCITON, LATICACIDVEN in the last 168 hours.  Recent Results (from the past 240 hour(s))  C difficile quick scan w PCR reflex     Status: None   Collection Time: 10/29/15  2:55 PM  Result Value Ref Range Status   C Diff antigen NEGATIVE NEGATIVE Final   C Diff toxin NEGATIVE NEGATIVE Final   C Diff interpretation No C. difficile detected.  Final    Comment: Performed at [facility]         Radiology Studies: Ct Abdomen Pelvis W Contrast  10/29/2015  CLINICAL DATA:  Concern for small bowel obstruction. History of bowel obstructions teenager following appendectomy. Elevated white blood cell count. Recent C difficile infection. EXAM: CT ABDOMEN AND PELVIS WITH CONTRAST TECHNIQUE: Multidetector CT imaging of the abdomen and pelvis was  performed using the standard protocol following bolus administration of intravenous contrast. CONTRAST:  139mL ISOVUE-300 IOPAMIDOL (ISOVUE-300) INJECTION 61% COMPARISON:  CT 10/20/2015 FINDINGS: Lower chest: Lung bases are clear. Hepatobiliary: No focal hepatic lesion. No biliary duct dilatation. Gallbladder is normal. Common bile duct is normal. Pancreas: Pancreas is normal. No ductal dilatation. No pancreatic inflammation. Spleen: Normal spleen Adrenals/urinary tract: Adrenal glands are normal. Potential enhancing lesion in the mid LEFT kidney measuring 16 mm (image 30, series 7) is not changed. No hydronephrosis or hydroureter. Calcification in the distal LEFT ureter / vesicoureteral junction is not changed. No bladder calculi. Stomach/Bowel: Stomach, duodenum, small-bowel cecum are normal. Appendix is not identified. Colon and rectosigmoid colon are normal. No evidence of bowel inflammation or obstruction. There is fluid stool within the descending colon rectum. Vascular/Lymphatic: Abdominal aorta is normal caliber with atherosclerotic calcification. There is no retroperitoneal or periportal lymphadenopathy. No pelvic lymphadenopathy. Reproductive: Prostate normal Other: Small umbilical hernia.  No inguinal hernia Musculoskeletal: No  aggressive osseous lesion. IMPRESSION: 1. No evidence of bowel obstruction. 2. Fluid stool within the rectosigmoid colon. 3. No clear evidence of bowel inflammation. 4. Stable calculus at the LEFT vesicoureteral junction without evidence obstruction. 5. Stable enhancing lesion the mid LEFT kidney. Electronically Signed   By: Suzy Bouchard M.D.   On: 10/29/2015 17:44   Dg Abd Acute W/chest  10/29/2015  CLINICAL DATA:  One day history of vomiting and diarrhea ; lower abdominal pain EXAM: DG ABDOMEN ACUTE W/ 1V CHEST COMPARISON:  Chest CT October 19, 2014; chest CT October 19, 2014; CT abdomen and pelvis October 20, 2015 FINDINGS: PA chest: There is no edema or consolidation. Heart size  and pulmonary vascularity are normal. No adenopathy. Patient is status post right total shoulder replacement. Supine and upright abdomen: There is no appreciable bowel dilatation. There are multiple air-fluid levels, however. No free air. Multiple pelvic calcifications are consistent with phleboliths. IMPRESSION: Scattered air-fluid levels. Differential considerations include early bowel obstruction, ileus, and enteritis. Note that there is no appreciable bowel dilatation. No free air evident. Lungs clear. Electronically Signed   By: Lowella Grip III M.D.   On: 10/29/2015 16:08        Scheduled Meds: . antiseptic oral rinse  7 mL Mouth Rinse BID  . enoxaparin (LOVENOX) injection  40 mg Subcutaneous Daily  . famotidine (PEPCID) IV  20 mg Intravenous Q12H  . feeding supplement  1 Container Oral TID BM  . metronidazole  500 mg Intravenous Q8H  . vancomycin  125 mg Oral Q6H   Continuous Infusions: . dextrose 5 % and 0.45% NaCl 125 mL/hr at 10/30/15 0845     LOS: 1 day    Time spent: 25 min    Kelvin Cellar, MD [facility] Pager [phone]  If 7PM-7AM, please contact night-coverage [email or web address] Password [email or web address] 10/30/2015, 2:59 PM

## 2015-10-30 NOTE — Progress Notes (Signed)
Initial Nutrition Assessment  DOCUMENTATION CODES:   Obesity unspecified, Non-severe (moderate) malnutrition in context of chronic illness  INTERVENTION:   -Boost Breeze po TID, each supplement provides 250 kcal and 9 grams of protein  NUTRITION DIAGNOSIS:   Malnutrition related to chronic illness as evidenced by mild depletion of muscle mass, mild depletion of body fat, percent weight loss.  GOAL:   Patient will meet greater than or equal to 90% of their needs  MONITOR:   PO intake, Supplement acceptance, Labs, Weight trends, Skin, I & O's  REASON FOR ASSESSMENT:   Malnutrition Screening Tool    ASSESSMENT:   Patient is a 55 yo male with history of recurrent admissions for severe abdominal pain, vomiting and diarrhea. The symptoms started a month ago when he was diagnosed with gastroenteritis and treated with cipro then he started getting severe pain with watery diarrhea that warranted admissions with recent discharge 6 days ago to continue treatment with PO vancomycin. The patient thinks he was taken Flagyl. He said after discharge he did well for 4 days then started having symptoms 2 days ago with vomiting twice daily and diarrhea several times daily, watery with no blood. Symptoms with chills and fatigue but without fever or hematemesis. No chest pain or dyspnea. No cough. No dysuria.   Pt admitted with abdominal pain, nausea, and vomiting. Noted recent admission just one week ago. Per H&P, pt with recent hx of gastroenteritis.   Pt sleeping soundly at time of visit. Pt did not arouse when name was called. Unable to complete Nutrition-Focused physical exam at this time. Pt was covered in multiple blankets. Noted mild to moderate muscle depletion on orbital area. Nutrition focused physical exam performed on 10/21/15, which revealed mild fat and muscle depletion. Suspect no changes in exam at this time. Pt was identified with mild malnutrition at last visit, which is ongoing.   Wt  hx reviewed. Noted UBW around 240# per wt records. Pt has experienced a 52# (27.9%) wt loss over the past year, which is significant for time frame. Suspect some weight loss may be related to dehydration.   Noted clear liquid tray at bedside was untouched. Documented po 0%. RD will add Boost Breeze supplement to aid in caloric intake.   Labs reviewed.   Diet Order:  DIET SOFT Room service appropriate?: Yes; Fluid consistency:: Thin  Skin:  Reviewed, no issues  Last BM:  10/29/15  Height:   Ht Readings from Last 1 Encounters:  10/29/15 5' 1.2" (1.554 m)    Weight:   Wt Readings from Last 1 Encounters:  10/29/15 186 lb 3.2 oz (84.46 kg)    Ideal Body Weight:  51.4 kg  BMI:  Body mass index is 34.97 kg/(m^2).  Estimated Nutritional Needs:   Kcal:  1900-2100  Protein:  100-115 grams  Fluid:  1.9-2.1 L  EDUCATION NEEDS:   No education needs identified at this time  Etheleen Valtierra A. Jimmye Norman, RD, LDN, CDE Pager: [phone] After hours Pager: [phone]

## 2015-10-31 LAB — BASIC METABOLIC PANEL
Anion gap: 7 (ref 5–15)
BUN: 6 mg/dL (ref 6–20)
CO2: 25 mmol/L (ref 22–32)
Calcium: 9 mg/dL (ref 8.9–10.3)
Chloride: 107 mmol/L (ref 101–111)
Creatinine, Ser: 0.92 mg/dL (ref 0.61–1.24)
GFR calc Af Amer: >60 mL/min (ref >60)
GFR calc non Af Amer: >60 mL/min (ref >60)
Glucose, Bld: 108 mg/dL — ABNORMAL HIGH (ref 65–99)
Potassium: 3.6 mmol/L (ref 3.5–5.1)
Sodium: 139 mmol/L (ref 135–145)

## 2015-10-31 LAB — CBC
HCT: 41.7 % (ref 39.0–52.0)
Hemoglobin: 14 g/dL (ref 13.0–17.0)
MCH: 30 pg (ref 26.0–34.0)
MCHC: 33.6 g/dL (ref 30.0–36.0)
MCV: 89.3 fL (ref 78.0–100.0)
Platelets: 189 10*3/uL (ref 150–400)
RBC: 4.67 MIL/uL (ref 4.22–5.81)
RDW: 13.6 % (ref 11.5–15.5)
WBC: 10.5 10*3/uL (ref 4.0–10.5)

## 2015-10-31 MED ORDER — FAMOTIDINE 20 MG PO TABS
20.0000 mg | ORAL_TABLET | Freq: Two times a day (BID) | ORAL | Status: DC
  Administered 2015-10-31 – 2015-11-02 (×4): 20 mg via ORAL
  Filled 2015-10-31 (×4): qty 1

## 2015-10-31 MED ORDER — HYDROCODONE-ACETAMINOPHEN 5-325 MG PO TABS
1.0000 | ORAL_TABLET | Freq: Four times a day (QID) | ORAL | Status: DC | PRN
  Administered 2015-10-31: 1 via ORAL
  Administered 2015-11-01 – 2015-11-02 (×5): 2 via ORAL
  Filled 2015-10-31 (×5): qty 2
  Filled 2015-10-31: qty 1

## 2015-10-31 MED ORDER — HYDROCODONE-ACETAMINOPHEN 5-325 MG PO TABS
1.0000 | ORAL_TABLET | Freq: Four times a day (QID) | ORAL | Status: DC | PRN
  Administered 2015-10-31: 1 via ORAL
  Filled 2015-10-31: qty 1

## 2015-10-31 NOTE — Progress Notes (Signed)
PROGRESS NOTE    [name]  [id number] DOB: [birth year] DOA: 10/29/2015 PCP: Kathlene November, MD   Brief Narrative:  [name] is a 55 year old gentleman with comorbidities, recently hospitalized from 10/12/2015 through 10/24/2015 represented with nausea, , vomiting, diarrhea. There is suspicion that symptoms could be related to C. difficile colitis. He was seen by GI during that hospitalization. He was discharged on vancomycin 125 mg. Overnight he presented to the emergency department with complaints of nausea, vomiting, diarrhea with symptoms getting significantly worse in the past 24 hours. Initial lab work revealed a white count of 14,300. He was started on vancomycin 125 mg by mouth every 6 hours along with IV Flagyl.  Assessment & Plan:   Active Problems:   Essential hypertension   Abdominal pain   Enteritis due to Clostridium difficile  1.  Suspected C. difficile colitis -[name] presents with complaints of worsening nausea, vomiting, diarrhea in the last 24 hours. He had a recent hospitalization where he was evaluated by GI and his symptoms were felt to be related to C. difficile colitis. At the time he seemed to show response to oral vancomycin -Initial workup revealed an elevated white count of 14,300 -He was started on IV Flagyl and continued on oral vancomycin -On 10/31/2015 reporting having ongoing liquid consistency stools however decreasing in frequency. He is able to tolerate by mouth now. Continue supportive care. I think he continues to look better and is tolerating by mouth, IV Flagyl can be discontinued tomorrow morning and remain on oral vancomycin. Labs showing a downward trend in his white count to 10,500 (was 14,300 on 10/29/2015)  2.  Dehydration -He presents with multiple episodes of nausea vomiting and diarrhea, clinically appears dry -On 10/31/2015 showing clinical improvement, asking to stop IV fluids and try PO  3.  History of hypothyroidism -On  10/20/2015 found to have a TSH of 0.2, with free T4 1.01 -Per records she is not on thyroid replacement therapy. TSH will need to be repeated in 3-4 weeks  4.  History of coronary artery disease -Year 2016 That Revealed Nonobstructive Coronary Artery Disease with Preserved Ejection Fraction 55%   DVT prophylaxis: Lovenox Code Status: Full code Family Communication:  Disposition Plan: Anticipate discharge home in the next 3 days  Consultants:     Procedures:     Antimicrobials:   Oral vancomycin  Flagyl   Subjective: Denies further episodes of nausea and vomiting, states diarrhea decreasing in frequency  Objective: Filed Vitals:   10/30/15 1242 10/30/15 2133 10/31/15 0508 10/31/15 1304  BP: 145/87 127/92 145/89 108/69  Pulse: 70 67 70 65  Temp: 98.2 F (36.8 C) 97.7 F (36.5 C) 98.1 F (36.7 C) 98.5 F (36.9 C)  TempSrc:      Resp: 19 18 18 19   Height:      Weight:      SpO2: 97% 98% 100% 96%    Intake/Output Summary (Last 24 hours) at 10/31/15 1522 Last data filed at 10/31/15 1455  Gross per 24 hour  Intake 2359.58 ml  Output    200 ml  Net 2159.58 ml   Filed Weights   10/29/15 1439 10/29/15 2217  Weight: 92.534 kg (204 lb) 84.46 kg (186 lb 3.2 oz)    Examination:  General exam: Overall think he looks better today Respiratory system: Clear to auscultation. Respiratory effort normal. Cardiovascular system: S1 & S2 heard, RRR. No JVD, murmurs, rubs, gallops or clicks. No pedal edema. Gastrointestinal system: Has ongoing generalized tenderness to  palpation, positive bowel sounds no rebound tenderness or guarding Central nervous system: Alert and oriented. No focal neurological deficits. Extremities: Symmetric 5 x 5 power. Skin: No rashes, lesions or ulcers Psychiatry: Judgement and insight appear normal. Mood & affect appropriate.     Data Reviewed: I have personally reviewed following labs and imaging studies  CBC:  Recent Labs Lab  10/29/15 1520 10/30/15 0455 10/31/15 0522  WBC 14.3* 13.5* 10.5  NEUTROABS 10.8*  --   --   HGB 16.7 14.4 14.0  HCT 46.6 42.3 41.7  MCV 86.1 88.3 89.3  PLT 205 208 [id number]   Basic Metabolic Panel:  Recent Labs Lab 10/29/15 1520 10/30/15 0455 10/31/15 0522  NA 138 138 139  K 3.9 3.9 3.6  CL 105 106 107  CO2 23 24 25   GLUCOSE 103* 130* 108*  BUN 11 9 6   CREATININE 0.75 0.87 0.92  CALCIUM 10.3 9.3 9.0   GFR: Estimated Creatinine Clearance: 87 mL/min (by C-G formula based on Cr of 0.92). Liver Function Tests:  Recent Labs Lab 10/30/15 0455  AST 14*  ALT 11*  ALKPHOS 36*  BILITOT 0.5  PROT 6.5  ALBUMIN 3.3*   No results for input(s): LIPASE, AMYLASE in the last 168 hours. No results for input(s): AMMONIA in the last 168 hours. Coagulation Profile: No results for input(s): INR, PROTIME in the last 168 hours. Cardiac Enzymes: No results for input(s): CKTOTAL, CKMB, CKMBINDEX, TROPONINI in the last 168 hours. BNP (last 3 results) No results for input(s): PROBNP in the last 8760 hours. HbA1C: No results for input(s): HGBA1C in the last 72 hours. CBG: No results for input(s): GLUCAP in the last 168 hours. Lipid Profile: No results for input(s): CHOL, HDL, LDLCALC, TRIG, CHOLHDL, LDLDIRECT in the last 72 hours. Thyroid Function Tests: No results for input(s): TSH, T4TOTAL, FREET4, T3FREE, THYROIDAB in the last 72 hours. Anemia Panel: No results for input(s): VITAMINB12, FOLATE, FERRITIN, TIBC, IRON, RETICCTPCT in the last 72 hours. Sepsis Labs: No results for input(s): PROCALCITON, LATICACIDVEN in the last 168 hours.  Recent Results (from the past 240 hour(s))  C difficile quick scan w PCR reflex     Status: None   Collection Time: 10/29/15  2:55 PM  Result Value Ref Range Status   C Diff antigen NEGATIVE NEGATIVE Final   C Diff toxin NEGATIVE NEGATIVE Final   C Diff interpretation No C. difficile detected.  Final    Comment: Performed at [facility]   Gastrointestinal Panel by PCR , Stool     Status: None   Collection Time: 10/30/15  8:02 AM  Result Value Ref Range Status   Campylobacter species NOT DETECTED NOT DETECTED Final   Plesimonas shigelloides NOT DETECTED NOT DETECTED Final   Salmonella species NOT DETECTED NOT DETECTED Final   Yersinia enterocolitica NOT DETECTED NOT DETECTED Final   Vibrio species NOT DETECTED NOT DETECTED Final   Vibrio cholerae NOT DETECTED NOT DETECTED Final   Enteroaggregative E coli (EAEC) NOT DETECTED NOT DETECTED Final   Enteropathogenic E coli (EPEC) NOT DETECTED NOT DETECTED Final   Enterotoxigenic E coli (ETEC) NOT DETECTED NOT DETECTED Final   Shiga like toxin producing E coli (STEC) NOT DETECTED NOT DETECTED Final   E. coli O157 NOT DETECTED NOT DETECTED Final   Shigella/Enteroinvasive E coli (EIEC) NOT DETECTED NOT DETECTED Final   Cryptosporidium NOT DETECTED NOT DETECTED Final   Cyclospora cayetanensis NOT DETECTED NOT DETECTED Final   Entamoeba histolytica NOT DETECTED NOT DETECTED Final  Giardia lamblia NOT DETECTED NOT DETECTED Final   Adenovirus F40/41 NOT DETECTED NOT DETECTED Final   Astrovirus NOT DETECTED NOT DETECTED Final   Norovirus GI/GII NOT DETECTED NOT DETECTED Final   Rotavirus A NOT DETECTED NOT DETECTED Final   Sapovirus (I, II, IV, and V) NOT DETECTED NOT DETECTED Final         Radiology Studies: Ct Abdomen Pelvis W Contrast  10/29/2015  CLINICAL DATA:  Concern for small bowel obstruction. History of bowel obstructions teenager following appendectomy. Elevated white blood cell count. Recent C difficile infection. EXAM: CT ABDOMEN AND PELVIS WITH CONTRAST TECHNIQUE: Multidetector CT imaging of the abdomen and pelvis was performed using the standard protocol following bolus administration of intravenous contrast. CONTRAST:  154mL ISOVUE-300 IOPAMIDOL (ISOVUE-300) INJECTION 61% COMPARISON:  CT 10/20/2015 FINDINGS: Lower chest: Lung bases are clear. Hepatobiliary: No  focal hepatic lesion. No biliary duct dilatation. Gallbladder is normal. Common bile duct is normal. Pancreas: Pancreas is normal. No ductal dilatation. No pancreatic inflammation. Spleen: Normal spleen Adrenals/urinary tract: Adrenal glands are normal. Potential enhancing lesion in the mid LEFT kidney measuring 16 mm (image 30, series 7) is not changed. No hydronephrosis or hydroureter. Calcification in the distal LEFT ureter / vesicoureteral junction is not changed. No bladder calculi. Stomach/Bowel: Stomach, duodenum, small-bowel cecum are normal. Appendix is not identified. Colon and rectosigmoid colon are normal. No evidence of bowel inflammation or obstruction. There is fluid stool within the descending colon rectum. Vascular/Lymphatic: Abdominal aorta is normal caliber with atherosclerotic calcification. There is no retroperitoneal or periportal lymphadenopathy. No pelvic lymphadenopathy. Reproductive: Prostate normal Other: Small umbilical hernia.  No inguinal hernia Musculoskeletal: No aggressive osseous lesion. IMPRESSION: 1. No evidence of bowel obstruction. 2. Fluid stool within the rectosigmoid colon. 3. No clear evidence of bowel inflammation. 4. Stable calculus at the LEFT vesicoureteral junction without evidence obstruction. 5. Stable enhancing lesion the mid LEFT kidney. Electronically Signed   By: Suzy Bouchard M.D.   On: 10/29/2015 17:44   Dg Abd Acute W/chest  10/29/2015  CLINICAL DATA:  One day history of vomiting and diarrhea ; lower abdominal pain EXAM: DG ABDOMEN ACUTE W/ 1V CHEST COMPARISON:  Chest CT October 19, 2014; chest CT October 19, 2014; CT abdomen and pelvis October 20, 2015 FINDINGS: PA chest: There is no edema or consolidation. Heart size and pulmonary vascularity are normal. No adenopathy. Patient is status post right total shoulder replacement. Supine and upright abdomen: There is no appreciable bowel dilatation. There are multiple air-fluid levels, however. No free air. Multiple  pelvic calcifications are consistent with phleboliths. IMPRESSION: Scattered air-fluid levels. Differential considerations include early bowel obstruction, ileus, and enteritis. Note that there is no appreciable bowel dilatation. No free air evident. Lungs clear. Electronically Signed   By: Lowella Grip III M.D.   On: 10/29/2015 16:08        Scheduled Meds: . antiseptic oral rinse  7 mL Mouth Rinse BID  . enoxaparin (LOVENOX) injection  40 mg Subcutaneous Daily  . famotidine  20 mg Oral BID  . feeding supplement  1 Container Oral TID BM  . metronidazole  500 mg Intravenous Q8H  . vancomycin  125 mg Oral Q6H   Continuous Infusions:     LOS: 2 days    Time spent: 25 min    Kelvin Cellar, MD [facility] Pager [phone]  If 7PM-7AM, please contact night-coverage [email or web address] Password [email or web address] 10/31/2015, 3:22 PM

## 2015-11-01 ENCOUNTER — Ambulatory Visit: Payer: BLUE CROSS/BLUE SHIELD | Admitting: Physician Assistant

## 2015-11-01 MED ORDER — LIDOCAINE 5 % EX PTCH
1.0000 | MEDICATED_PATCH | CUTANEOUS | Status: DC
Start: 2015-11-01 — End: 2015-11-02
  Administered 2015-11-01: 1 via TRANSDERMAL
  Filled 2015-11-01: qty 1

## 2015-11-01 MED ORDER — CYCLOBENZAPRINE HCL 5 MG PO TABS
7.5000 mg | ORAL_TABLET | Freq: Three times a day (TID) | ORAL | Status: DC | PRN
  Administered 2015-11-01 – 2015-11-02 (×3): 7.5 mg via ORAL
  Filled 2015-11-01 (×3): qty 2

## 2015-11-01 NOTE — Progress Notes (Signed)
PROGRESS NOTE    [name]  [id number] DOB: 07/22/1960 DOA: 10/29/2015 PCP: Kathlene November, MD   Brief Narrative:  Mr Thibodeau is a 55 year old gentleman with comorbidities, recently hospitalized from 10/12/2015 through 10/24/2015 represented with nausea, , vomiting, diarrhea. There is suspicion that symptoms could be related to C. difficile colitis. He was seen by GI during that hospitalization. He was discharged on vancomycin 125 mg. Overnight he presented to the emergency department with complaints of nausea, vomiting, diarrhea with symptoms getting significantly worse in the past 24 hours. Initial lab work revealed a white count of 14,300. He was started on vancomycin 125 mg by mouth every 6 hours along with IV Flagyl.  Assessment & Plan:   Active Problems:   Essential hypertension   Abdominal pain   Enteritis due to Clostridium difficile  1.  Suspected C. difficile colitis -Mr Gassner presents with complaints of worsening nausea, vomiting, diarrhea in the last 24 hours. He had a recent hospitalization where he was evaluated by GI and his symptoms were felt to be related to C. difficile colitis. At the time he seemed to show response to oral vancomycin -Initial workup revealed an elevated white count of 14,300 -He was started on IV Flagyl and continued on oral vancomycin -On 10/31/2015 reporting having ongoing liquid consistency stools however decreasing in frequency. He is able to tolerate by mouth now. Continue supportive care. I think he continues to look better and is tolerating by mouth, IV Flagyl can be discontinued tomorrow morning and remain on oral vancomycin. Labs showing a downward trend in his white count to 10,500 (was 14,300 on 10/29/2015) -On 11/01/2015 patient reporting feeling better although continues to have some nausea. He has been tolerating food and oral medications. I discontinued his IV Flagyl and maintained him on oral vancomycin. We'll see how he does over the  next 24 hours.  2.  Dehydration -He presents with multiple episodes of nausea vomiting and diarrhea, clinically appears dry -On 10/31/2015 showing clinical improvement, asking to stop IV fluids and try PO -On 11/01/2015 continues to tolerate by mouth  3.  History of hypothyroidism -On 10/20/2015 found to have a TSH of 0.2, with free T4 1.01 -Per records she is not on thyroid replacement therapy. TSH will need to be repeated in 3-4 weeks  4.  History of coronary artery disease -Status post cardiac catheterization in 2016 revealed Nonobstructive Coronary Artery Disease with Preserved Ejection Fraction 55%   DVT prophylaxis: Lovenox Code Status: Full code Family Communication:  Disposition Plan: Anticipate discharge home in the next 24 hours if he continues to show improvement  Consultants:     Procedures:     Antimicrobials:   Oral vancomycin  Flagyl   Subjective: He states feeling a little better today, tolerating by mouth intake. Did have some nausea and one episode of diarrhea in the last 24 hours  Objective: Filed Vitals:   10/31/15 1304 10/31/15 2147 11/01/15 0607 11/01/15 1358  BP: 108/69 118/78 105/77 106/71  Pulse: 65 72 61 64  Temp: 98.5 F (36.9 C) 98.9 F (37.2 C) 98.1 F (36.7 C) 98.3 F (36.8 C)  TempSrc:      Resp: 19 18 18 18   Height:      Weight:      SpO2: 96% 98% 100% 98%    Intake/Output Summary (Last 24 hours) at 11/01/15 1511 Last data filed at 11/01/15 1359  Gross per 24 hour  Intake    820 ml  Output   1700  ml  Net   -880 ml   Filed Weights   10/29/15 1439 10/29/15 2217  Weight: 92.534 kg (204 lb) 84.46 kg (186 lb 3.2 oz)    Examination:  General exam: Awake and alert Respiratory system: Clear to auscultation. Respiratory effort normal. Cardiovascular system: S1 & S2 heard, RRR. No JVD, murmurs, rubs, gallops or clicks. No pedal edema. Gastrointestinal system: There has been intermittent improvement to abdominal pain, not as  tender to palpation Central nervous system: Alert and oriented. No focal neurological deficits. Extremities: Symmetric 5 x 5 power. Skin: No rashes, lesions or ulcers Psychiatry: Judgement and insight appear normal. Mood & affect appropriate.     Data Reviewed: I have personally reviewed following labs and imaging studies  CBC:  Recent Labs Lab 10/29/15 1520 10/30/15 0455 10/31/15 0522  WBC 14.3* 13.5* 10.5  NEUTROABS 10.8*  --   --   HGB 16.7 14.4 14.0  HCT 46.6 42.3 41.7  MCV 86.1 88.3 89.3  PLT 205 208 [id number]   Basic Metabolic Panel:  Recent Labs Lab 10/29/15 1520 10/30/15 0455 10/31/15 0522  NA 138 138 139  K 3.9 3.9 3.6  CL 105 106 107  CO2 23 24 25   GLUCOSE 103* 130* 108*  BUN 11 9 6   CREATININE 0.75 0.87 0.92  CALCIUM 10.3 9.3 9.0   GFR: Estimated Creatinine Clearance: 87 mL/min (by C-G formula based on Cr of 0.92). Liver Function Tests:  Recent Labs Lab 10/30/15 0455  AST 14*  ALT 11*  ALKPHOS 36*  BILITOT 0.5  PROT 6.5  ALBUMIN 3.3*   No results for input(s): LIPASE, AMYLASE in the last 168 hours. No results for input(s): AMMONIA in the last 168 hours. Coagulation Profile: No results for input(s): INR, PROTIME in the last 168 hours. Cardiac Enzymes: No results for input(s): CKTOTAL, CKMB, CKMBINDEX, TROPONINI in the last 168 hours. BNP (last 3 results) No results for input(s): PROBNP in the last 8760 hours. HbA1C: No results for input(s): HGBA1C in the last 72 hours. CBG: No results for input(s): GLUCAP in the last 168 hours. Lipid Profile: No results for input(s): CHOL, HDL, LDLCALC, TRIG, CHOLHDL, LDLDIRECT in the last 72 hours. Thyroid Function Tests: No results for input(s): TSH, T4TOTAL, FREET4, T3FREE, THYROIDAB in the last 72 hours. Anemia Panel: No results for input(s): VITAMINB12, FOLATE, FERRITIN, TIBC, IRON, RETICCTPCT in the last 72 hours. Sepsis Labs: No results for input(s): PROCALCITON, LATICACIDVEN in the last 168  hours.  Recent Results (from the past 240 hour(s))  C difficile quick scan w PCR reflex     Status: None   Collection Time: 10/29/15  2:55 PM  Result Value Ref Range Status   C Diff antigen NEGATIVE NEGATIVE Final   C Diff toxin NEGATIVE NEGATIVE Final   C Diff interpretation No C. difficile detected.  Final    Comment: Performed at [facility]  Gastrointestinal Panel by PCR , Stool     Status: None   Collection Time: 10/30/15  8:02 AM  Result Value Ref Range Status   Campylobacter species NOT DETECTED NOT DETECTED Final   Plesimonas shigelloides NOT DETECTED NOT DETECTED Final   Salmonella species NOT DETECTED NOT DETECTED Final   Yersinia enterocolitica NOT DETECTED NOT DETECTED Final   Vibrio species NOT DETECTED NOT DETECTED Final   Vibrio cholerae NOT DETECTED NOT DETECTED Final   Enteroaggregative E coli (EAEC) NOT DETECTED NOT DETECTED Final   Enteropathogenic E coli (EPEC) NOT DETECTED NOT DETECTED Final  Enterotoxigenic E coli (ETEC) NOT DETECTED NOT DETECTED Final   Shiga like toxin producing E coli (STEC) NOT DETECTED NOT DETECTED Final   E. coli O157 NOT DETECTED NOT DETECTED Final   Shigella/Enteroinvasive E coli (EIEC) NOT DETECTED NOT DETECTED Final   Cryptosporidium NOT DETECTED NOT DETECTED Final   Cyclospora cayetanensis NOT DETECTED NOT DETECTED Final   Entamoeba histolytica NOT DETECTED NOT DETECTED Final   Giardia lamblia NOT DETECTED NOT DETECTED Final   Adenovirus F40/41 NOT DETECTED NOT DETECTED Final   Astrovirus NOT DETECTED NOT DETECTED Final   Norovirus GI/GII NOT DETECTED NOT DETECTED Final   Rotavirus A NOT DETECTED NOT DETECTED Final   Sapovirus (I, II, IV, and V) NOT DETECTED NOT DETECTED Final         Radiology Studies: No results found.      Scheduled Meds: . antiseptic oral rinse  7 mL Mouth Rinse BID  . enoxaparin (LOVENOX) injection  40 mg Subcutaneous Daily  . famotidine  20 mg Oral BID  . feeding supplement  1  Container Oral TID BM  . lidocaine  1 patch Transdermal Q24H  . vancomycin  125 mg Oral Q6H   Continuous Infusions:     LOS: 3 days    Time spent: 25 min    Kelvin Cellar, MD [facility] Pager [phone]  If 7PM-7AM, please contact night-coverage [email or web address] Password [email or web address] 11/01/2015, 3:11 PM

## 2015-11-01 NOTE — Care Management Note (Addendum)
Case Management Note  Patient Details  Name: [name] MRN: [id number] Date of Birth: 1960/08/24  Subjective/Objective:                 Patient readmitted after being home 5 days with similar symtoms of N/V/D abd pain. Spoke with patient in the room, no home needs identified. Bene check equested for cost of Vanc oral solution, looks like pt may have been DC'd last admit with Vanc capsules.    Action/Plan:  CM continue to follow for DC planning.  Addendum- Patient provided map and contact information for [facility] to get Vanc filled. Bene Check results not available at time of DC. Faxed to OP Pharm for price check   Expected Discharge Date:                  Expected Discharge Plan:  Home/Self Care  In-House Referral:     Discharge planning Services  CM Consult  Post Acute Care Choice:    Choice offered to:     DME Arranged:    DME Agency:     HH Arranged:    HH Agency:     Status of Service:  Completed, signed off  If discussed at [employer] of Stay Meetings, dates discussed:    Additional Comments:  Carles Collet, RN 11/01/2015, 12:26 PM

## 2015-11-02 ENCOUNTER — Inpatient Hospital Stay: Payer: BLUE CROSS/BLUE SHIELD | Admitting: Internal Medicine

## 2015-11-02 DIAGNOSIS — Z0289 Encounter for other administrative examinations: Secondary | ICD-10-CM

## 2015-11-02 MED ORDER — LIDOCAINE 5 % EX PTCH: 1.0000 | MEDICATED_PATCH | CUTANEOUS | Status: DC

## 2015-11-02 MED ORDER — CYCLOBENZAPRINE HCL 7.5 MG PO TABS: 7.5000 mg | ORAL_TABLET | Freq: Three times a day (TID) | ORAL | Status: DC | PRN

## 2015-11-02 MED ORDER — ONDANSETRON HCL 4 MG PO TABS: 4.0000 mg | ORAL_TABLET | Freq: Three times a day (TID) | ORAL | Status: DC | PRN

## 2015-11-02 MED ORDER — HYDROCODONE-ACETAMINOPHEN 5-325 MG PO TABS: 1.0000 | ORAL_TABLET | Freq: Four times a day (QID) | ORAL | Status: DC | PRN

## 2015-11-02 MED ORDER — VANCOMYCIN 50 MG/ML ORAL SOLUTION: ORAL | Status: DC

## 2015-11-02 MED FILL — VANCOMYCIN HCL 125 MG CAP: 125 | 28 days supply | Qty: 77 | Fill #0

## 2015-11-02 NOTE — Discharge Summary (Signed)
Physician Discharge Summary  [name] [id number] DOB: [birth year] DOA: 10/29/2015  PCP: Kathlene November, MD  Admit date: 10/29/2015 Discharge date: 11/02/2015  Time spent: 35 minutes  Recommendations for Outpatient Follow-up:  1. [name] was discharged on oral vancomycin taper, please follow-up on symptoms 2. Please follow-up on blood pressures, Norvasc was stopped on discharged due to low blood pressures during this hospitalization   Discharge Diagnoses:  Active Problems:   Essential hypertension   Abdominal pain   Enteritis due to Clostridium difficile   Discharge Condition: Stable/improved  Diet recommendation: Regular diet  Filed Weights   10/29/15 1439 10/29/15 2217  Weight: 92.534 kg (204 lb) 84.46 kg (186 lb 3.2 oz)    History of present illness:  Patient is a 55 yo male with history of recurrent admissions for severe abdominal pain, vomiting and diarrhea. The symptoms started a month ago when he was diagnosed with gastroenteritis and treated with cipro then he started getting severe pain with watery diarrhea that warranted admissions with recent discharge 6 days ago to continue treatment with PO vancomycin. The patient thinks he was taken Flagyl. He said after discharge he did well for 4 days then started having symptoms 2 days ago with vomiting twice daily and diarrhea several times daily, watery with no blood. Symptoms with chills and fatigue but without fever or hematemesis. No chest pain or dyspnea. No cough. No dysuria.   Hospital Course:  [name] is a 55 year old gentleman with comorbidities, recently hospitalized from 10/12/2015 through 10/24/2015 represented with nausea, , vomiting, diarrhea. There is suspicion that symptoms could be related to C. difficile colitis. He was seen by GI during that hospitalization. He was discharged on vancomycin 125 mg. Overnight he presented to the emergency department with complaints of nausea, vomiting, diarrhea with symptoms  getting significantly worse in the past 24 hours. Initial lab work revealed a white count of 14,300. He was started on vancomycin 125 mg by mouth every 6 hours along with IV Flagyl. Suspect this represented C. difficile recurrence. He showed gradual clinical improvement throughout this hospitalization with lab work showing downward trend in his white count to 10,000 by 10/31/2015. His nausea and diarrhea significant improvement as he was tolerating by mouth intake by day of discharge. IV Flagyl was discontinued as he was maintained on oral vancomycin. He was discharged on oral vancomycin pulsed taper given my suspicions for recurrence of C. difficile colitis. He was discharged on 125 mg 4 times daily for 14 days followed by 125 mg twice a day for 7 days followed by 125 mg daily for 7 days followed by 125 mg every other day for 7 days followed by a 25 mg every 3 days for 15 days. Please follow.    Discharge Exam: Filed Vitals:   11/01/15 2159 11/02/15 0517  BP: 103/67 128/79  Pulse: 73 59  Temp: 98.6 F (37 C) 98.1 F (36.7 C)  Resp: 16 16    General exam: Awake and alert Respiratory system: Clear to auscultation. Respiratory effort normal. Cardiovascular system: S1 & S2 heard, RRR. No JVD, murmurs, rubs, gallops or clicks. No pedal edema. Gastrointestinal system: There has been intermittent improvement to abdominal pain, not as tender to palpation Central nervous system: Alert and oriented. No focal neurological deficits. Extremities: Symmetric 5 x 5 power. Skin: No rashes, lesions or ulcers Psychiatry: Judgement and insight appear normal. Mood & affect appropriate.   Discharge Instructions   Discharge Instructions    Call MD for:  difficulty breathing,  headache or visual disturbances    Complete by:  As directed      Call MD for:  extreme fatigue    Complete by:  As directed      Call MD for:  hives    Complete by:  As directed      Call MD for:  persistant dizziness or  light-headedness    Complete by:  As directed      Call MD for:  persistant nausea and vomiting    Complete by:  As directed      Call MD for:  redness, tenderness, or signs of infection (pain, swelling, redness, odor or green/yellow discharge around incision site)    Complete by:  As directed      Call MD for:  severe uncontrolled pain    Complete by:  As directed      Call MD for:  temperature >100.4    Complete by:  As directed      Call MD for:    Complete by:  As directed      Diet - low sodium heart healthy    Complete by:  As directed      Increase activity slowly    Complete by:  As directed           Current Discharge Medication List    START taking these medications   Details  cyclobenzaprine (FEXMID) 7.5 MG tablet Take 1 tablet (7.5 mg total) by mouth 3 (three) times daily as needed for muscle spasms. Qty: 15 tablet, Refills: 0    HYDROcodone-acetaminophen (NORCO/VICODIN) 5-325 MG tablet Take 1 tablet by mouth every 6 (six) hours as needed for severe pain. Qty: 5 tablet, Refills: 0    lidocaine (LIDODERM) 5 % Place 1 patch onto the skin daily. Remove & Discard patch within 12 hours or as directed by MD Qty: 10 patch, Refills: 0    ondansetron (ZOFRAN) 4 MG tablet Take 1 tablet (4 mg total) by mouth every 8 (eight) hours as needed for nausea or vomiting. Qty: 20 tablet, Refills: 0      CONTINUE these medications which have CHANGED   Details  vancomycin (VANCOCIN) 50 mg/mL oral solution Take 125 mg PO 4 times daily for 2 weeks, followed by 125 mg PO BID x 7 days, followed by 125 mg PO q daily x 7 days, followed by 125 mg PO every other day for 7 days followed by 125 mg PO q 3 days for 15 days, then stop. Qty Sufficient for taper. Qty: 10 mL, Refills: 0      CONTINUE these medications which have NOT CHANGED   Details  aspirin EC 81 MG EC tablet Take 1 tablet (81 mg total) by mouth daily. Qty: 30 tablet, Refills: 3    atorvastatin (LIPITOR) 20 MG tablet Take 1  tablet (20 mg total) by mouth at bedtime. Qty: 30 tablet, Refills: 3    losartan (COZAAR) 50 MG tablet Take 1 tablet (50 mg total) by mouth daily. Qty: 30 tablet, Refills: 3    saccharomyces boulardii (FLORASTOR) 250 MG capsule Take 1 capsule (250 mg total) by mouth 2 (two) times daily. Qty: 60 capsule, Refills: 0      STOP taking these medications     amLODipine (NORVASC) 10 MG tablet      ARTIFICIAL TEAR SOLUTION OP      promethazine (PHENERGAN) 25 MG tablet      famotidine (PEPCID) 40 MG tablet      feeding  supplement, ENSURE ENLIVE, (ENSURE ENLIVE) LIQD        No Known Allergies Follow-up Information    Follow up with Kathlene November, MD In 1 week.   Specialty:  Internal Medicine   Contact information:   [address] [place] [address] [phone]        The results of significant diagnostics from this hospitalization (including imaging, microbiology, ancillary and laboratory) are listed below for reference.    Significant Diagnostic Studies: Ct Abdomen Pelvis W Contrast  10/29/2015  CLINICAL DATA:  Concern for small bowel obstruction. History of bowel obstructions teenager following appendectomy. Elevated white blood cell count. Recent C difficile infection. EXAM: CT ABDOMEN AND PELVIS WITH CONTRAST TECHNIQUE: Multidetector CT imaging of the abdomen and pelvis was performed using the standard protocol following bolus administration of intravenous contrast. CONTRAST:  124mL ISOVUE-300 IOPAMIDOL (ISOVUE-300) INJECTION 61% COMPARISON:  CT 10/20/2015 FINDINGS: Lower chest: Lung bases are clear. Hepatobiliary: No focal hepatic lesion. No biliary duct dilatation. Gallbladder is normal. Common bile duct is normal. Pancreas: Pancreas is normal. No ductal dilatation. No pancreatic inflammation. Spleen: Normal spleen Adrenals/urinary tract: Adrenal glands are normal. Potential enhancing lesion in the mid LEFT kidney measuring 16 mm (image 30, series 7) is not changed.  No hydronephrosis or hydroureter. Calcification in the distal LEFT ureter / vesicoureteral junction is not changed. No bladder calculi. Stomach/Bowel: Stomach, duodenum, small-bowel cecum are normal. Appendix is not identified. Colon and rectosigmoid colon are normal. No evidence of bowel inflammation or obstruction. There is fluid stool within the descending colon rectum. Vascular/Lymphatic: Abdominal aorta is normal caliber with atherosclerotic calcification. There is no retroperitoneal or periportal lymphadenopathy. No pelvic lymphadenopathy. Reproductive: Prostate normal Other: Small umbilical hernia.  No inguinal hernia Musculoskeletal: No aggressive osseous lesion. IMPRESSION: 1. No evidence of bowel obstruction. 2. Fluid stool within the rectosigmoid colon. 3. No clear evidence of bowel inflammation. 4. Stable calculus at the LEFT vesicoureteral junction without evidence obstruction. 5. Stable enhancing lesion the mid LEFT kidney. Electronically Signed   By: Suzy Bouchard M.D.   On: 10/29/2015 17:44   Ct Abdomen Pelvis W Contrast  10/20/2015  CLINICAL DATA:  Diffuse abdominal pain with nausea, vomiting, and diarrhea. EXAM: CT ABDOMEN AND PELVIS WITH CONTRAST TECHNIQUE: Multidetector CT imaging of the abdomen and pelvis was performed using the standard protocol following bolus administration of intravenous contrast. CONTRAST:  11mL ISOVUE-300 IOPAMIDOL (ISOVUE-300) INJECTION 61% COMPARISON:  09/22/2015 FINDINGS: Lower chest:  Unremarkable. Hepatobiliary: No focal abnormality within the liver parenchyma. There is no evidence for gallstones, gallbladder wall thickening, or pericholecystic fluid. No intrahepatic or extrahepatic biliary dilation. Pancreas: No focal mass lesion. No dilatation of the main duct. No intraparenchymal cyst. No peripancreatic edema. Spleen: Tiny hypodensity in the spleen is stable and likely represents a cyst or pseudocyst. Adrenals/Urinary Tract: No adrenal nodule or mass. Right  kidney is unremarkable. Again noted in the interpolar region of the left kidney is a 1.9 x 1.7 cm lesion with heterogeneous attenuation and potential enhancement. Also in the interpolar left kidney is a second lesion measuring 2.4 cm. This lesion has homogeneous attenuation and measures water density, compatible with a simple cyst. No evidence for hydroureter. Despite the lack of hydroureter, the patient has an 8 x 5 mm stone in the left UVJ. This stone was seen in the left kidney on an exam from 12/07/2011. Stomach/Bowel: Stomach is nondistended. No gastric wall thickening. No evidence of outlet obstruction. Duodenum is normally positioned as is the  ligament of Treitz. No small bowel wall thickening. No small bowel dilatation. The terminal ileum is normal. No gross colonic mass. No colonic wall thickening. No substantial diverticular change. Vascular/Lymphatic: There is abdominal aortic atherosclerosis without aneurysm. There is no gastrohepatic or hepatoduodenal ligament lymphadenopathy. No intraperitoneal or retroperitoneal lymphadenopathy. No pelvic sidewall lymphadenopathy. Reproductive: The prostate gland and seminal vesicles have normal imaging features. Other: No intraperitoneal free fluid. Musculoskeletal: Small umbilical hernia contains only fat. 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 8 x 5 mm stone in the region of the left UVJ is probably in the left UVJ. The patient had a stone of similar size in the left kidney on 12/07/2011 which was not present in the kidney on 09/22/2015, suggesting interval migration between those 2 studies. It is somewhat unusual that there is no left hydroureteronephrosis given the size of this stone and stone projects essentially in the bladder wall but on sagittal reformations does appear to be just into the lumen of the bladder. Given the relative stability in this appearance since 09/22/2015, this is not an acute finding on  today's study. There is no abnormal perfusion in the left kidney to suggest obstructive uropathy. 2. Otherwise no acute findings on today's exam. 3. Abdominal aortic atherosclerosis. Electronically Signed   By: Misty Stanley M.D.   On: 10/20/2015 19:56   US Thyroid Biopsy  10/06/2015  INDICATION: 54 year old male with enlarging left lower thyroid nodule. EXAM: ULTRASOUND GUIDED NEEDLE ASPIRATE BIOPSY OF THE THYROID GLAND COMPARISON:  Prior thyroid ultrasound 10/01/2015 MEDICATIONS: 1% lidocaine COMPLICATIONS: None immediate. PROCEDURE: Informed written consent was obtained from the patient after a thorough discussion of the procedural risks, benefits and alternatives. All questions were addressed. Maximal Sterile Barrier Technique was utilized including caps, mask, sterile gowns, sterile gloves, sterile drape, hand hygiene and skin antiseptic. A timeout was performed prior to the initiation of the procedure. Ultrasound was performed to localize and mark an adequate site for the biopsy. The patient was then prepped and draped in a normal sterile fashion. Local anesthesia was provided with 1% lidocaine. Using direct ultrasound guidance, 4 passes were made using needles into the nodule within the left lobe of the thyroid. Ultrasound was used to confirm needle placements on all occasions. Specimens were sent to Pathology for analysis. IMPRESSION: Ultrasound guided needle aspirate biopsy performed of the left thyroid nodule. Electronically Signed   By: Jacqulynn Cadet M.D.   On: 10/06/2015 17:00   Dg Abd Acute W/chest  10/29/2015  CLINICAL DATA:  One day history of vomiting and diarrhea ; lower abdominal pain EXAM: DG ABDOMEN ACUTE W/ 1V CHEST COMPARISON:  Chest CT October 19, 2014; chest CT October 19, 2014; CT abdomen and pelvis October 20, 2015 FINDINGS: PA chest: There is no edema or consolidation. Heart size and pulmonary vascularity are normal. No adenopathy. Patient is status post right total shoulder replacement.  Supine and upright abdomen: There is no appreciable bowel dilatation. There are multiple air-fluid levels, however. No free air. Multiple pelvic calcifications are consistent with phleboliths. IMPRESSION: Scattered air-fluid levels. Differential considerations include early bowel obstruction, ileus, and enteritis. Note that there is no appreciable bowel dilatation. No free air evident. Lungs clear. Electronically Signed   By: Lowella Grip III M.D.   On: 10/29/2015 16:08    Microbiology: Recent Results (from the past 240 hour(s))  C difficile quick scan w PCR reflex     Status: None   Collection Time: 10/29/15  2:55  PM  Result Value Ref Range Status   C Diff antigen NEGATIVE NEGATIVE Final   C Diff toxin NEGATIVE NEGATIVE Final   C Diff interpretation No C. difficile detected.  Final    Comment: Performed at [facility]  Gastrointestinal Panel by PCR , Stool     Status: None   Collection Time: 10/30/15  8:02 AM  Result Value Ref Range Status   Campylobacter species NOT DETECTED NOT DETECTED Final   Plesimonas shigelloides NOT DETECTED NOT DETECTED Final   Salmonella species NOT DETECTED NOT DETECTED Final   Yersinia enterocolitica NOT DETECTED NOT DETECTED Final   Vibrio species NOT DETECTED NOT DETECTED Final   Vibrio cholerae NOT DETECTED NOT DETECTED Final   Enteroaggregative E coli (EAEC) NOT DETECTED NOT DETECTED Final   Enteropathogenic E coli (EPEC) NOT DETECTED NOT DETECTED Final   Enterotoxigenic E coli (ETEC) NOT DETECTED NOT DETECTED Final   Shiga like toxin producing E coli (STEC) NOT DETECTED NOT DETECTED Final   E. coli O157 NOT DETECTED NOT DETECTED Final   Shigella/Enteroinvasive E coli (EIEC) NOT DETECTED NOT DETECTED Final   Cryptosporidium NOT DETECTED NOT DETECTED Final   Cyclospora cayetanensis NOT DETECTED NOT DETECTED Final   Entamoeba histolytica NOT DETECTED NOT DETECTED Final   Giardia lamblia NOT DETECTED NOT DETECTED Final   Adenovirus F40/41 NOT  DETECTED NOT DETECTED Final   Astrovirus NOT DETECTED NOT DETECTED Final   Norovirus GI/GII NOT DETECTED NOT DETECTED Final   Rotavirus A NOT DETECTED NOT DETECTED Final   Sapovirus (I, II, IV, and V) NOT DETECTED NOT DETECTED Final     Labs: Basic Metabolic Panel:  Recent Labs Lab 10/29/15 1520 10/30/15 0455 10/31/15 0522  NA 138 138 139  K 3.9 3.9 3.6  CL 105 106 107  CO2 23 24 25   GLUCOSE 103* 130* 108*  BUN 11 9 6   CREATININE 0.75 0.87 0.92  CALCIUM 10.3 9.3 9.0   Liver Function Tests:  Recent Labs Lab 10/30/15 0455  AST 14*  ALT 11*  ALKPHOS 36*  BILITOT 0.5  PROT 6.5  ALBUMIN 3.3*   No results for input(s): LIPASE, AMYLASE in the last 168 hours. No results for input(s): AMMONIA in the last 168 hours. CBC:  Recent Labs Lab 10/29/15 1520 10/30/15 0455 10/31/15 0522  WBC 14.3* 13.5* 10.5  NEUTROABS 10.8*  --   --   HGB 16.7 14.4 14.0  HCT 46.6 42.3 41.7  MCV 86.1 88.3 89.3  PLT 205 208 189   Cardiac Enzymes: No results for input(s): CKTOTAL, CKMB, CKMBINDEX, TROPONINI in the last 168 hours. BNP: BNP (last 3 results) No results for input(s): BNP in the last 8760 hours.  ProBNP (last 3 results) No results for input(s): PROBNP in the last 8760 hours.  CBG: No results for input(s): GLUCAP in the last 168 hours.     Signed:  Kelvin Cellar MD.  [facility] 11/02/2015, 8:51 AM

## 2015-11-03 ENCOUNTER — Telehealth: Payer: Self-pay | Admitting: Internal Medicine

## 2015-11-03 NOTE — Telephone Encounter (Signed)
No Charge. Please reschedule 30 min hospital follow-up. Thank you.

## 2015-11-03 NOTE — Telephone Encounter (Signed)
Pt had an appt yesterday. Pt says that he was still in the hospital and missed his appt.

## 2015-11-05 NOTE — Telephone Encounter (Signed)
Pt is scheduled °

## 2015-11-08 ENCOUNTER — Encounter: Payer: Self-pay | Admitting: Internal Medicine

## 2015-11-08 ENCOUNTER — Ambulatory Visit (INDEPENDENT_AMBULATORY_CARE_PROVIDER_SITE_OTHER): Payer: BLUE CROSS/BLUE SHIELD | Admitting: Internal Medicine

## 2015-11-08 ENCOUNTER — Encounter: Payer: Self-pay | Admitting: Physician Assistant

## 2015-11-08 VITALS — BP 126/76 | HR 81 | Temp 97.8°F | Ht 71.0 in | Wt 200.1 lb

## 2015-11-08 DIAGNOSIS — E049 Nontoxic goiter, unspecified: Secondary | ICD-10-CM

## 2015-11-08 DIAGNOSIS — M0239 Reiter's disease, multiple sites: Secondary | ICD-10-CM | POA: Diagnosis not present

## 2015-11-08 DIAGNOSIS — A0472 Enterocolitis due to Clostridium difficile, not specified as recurrent: Secondary | ICD-10-CM

## 2015-11-08 DIAGNOSIS — E785 Hyperlipidemia, unspecified: Secondary | ICD-10-CM | POA: Diagnosis not present

## 2015-11-08 DIAGNOSIS — A047 Enterocolitis due to Clostridium difficile: Secondary | ICD-10-CM

## 2015-11-08 MED ORDER — ATORVASTATIN CALCIUM 20 MG PO TABS: 20.0000 mg | ORAL_TABLET | Freq: Every day | ORAL | Status: DC

## 2015-11-08 NOTE — Progress Notes (Signed)
Pre visit review using our clinic review tool, if applicable. No additional management support is needed unless otherwise documented below in the visit note. 

## 2015-11-08 NOTE — Patient Instructions (Signed)
Continue vancomycin as prescribed  We are referring you to Dr. Linna Hoff specializing back pain. If you have severe symptoms please call the office  Next visit in 3 weeks  Take zantac for acid reflux

## 2015-11-08 NOTE — Progress Notes (Signed)
Subjective:    Patient ID: [name], male    DOB: 1961-01-04, 55 y.o.   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 Denies fever chills Minimal nausea, no vomiting. Appetite is excellent Diarrhea still there but today for the first time has see solid stools. Interestingly, developed right-sided back pain, moderate, while at the hospital. A CT of the abdomen done upon admission show a left kidney stone and no other abnormalities. Pain is relatively steady, it does hurt at night, slightly better when he moves. No bladder or bowel incontinence Occasional tingling in the right leg.   Review of Systems See above  Past Medical History  Diagnosis Date  . Hypertension   . Unspecified asthma(493.90)   . Chest pain, atypical   . Nontoxic multinodular goiter   . Dyspnea on exertion   . Trigeminal neuralgia   . Cluster headache   . Hyperlipidemia   . GERD (gastroesophageal reflux disease)   . Complication of anesthesia     difficult waking up"  . Hiatal hernia   . Pulmonary nodule --- CT 10/19/2014: Nodule is stable, no further routine x-rays 01/06/2009  . Hypothyroidism   . TIA (transient ischemic attack)   . BPH (benign prostatic hyperplasia)   . Drug abuse     Past Surgical History  Procedure Laterality Date  . Appendectomy    . Knee arthroscopy    . 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 Right shoulder redo-reconstruction  06/21/10  . Destruction trigeminal nerve via neurolytic agent  x 3 , R side last ~2010  . Cardiac catheterization N/A 11/25/2014    Procedure: Left Heart Cath and Coronary Angiography;  Surgeon: Belva Crome, MD;   Location: [facility];  Service: Cardiovascular;  Laterality: N/A;    Social History   Social History  . Marital Status: Married    Spouse Name: N/A  . Number of Children: 2  . 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 Smoking status: Current Every Day Smoker -- 0.25 packs/day for 18 years    Types: Cigarettes  . Smokeless tobacco: Never Used     Comment: 07/10/13 1/2 ppd  . Alcohol Use: No  . Drug Use: No  . Sexual Activity: Not on file   Other Topics Concern  . 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  Always use your most recent med list.               aspirin 81 MG EC tablet  Take 1 tablet (81 mg total) by mouth daily.     atorvastatin 20 MG tablet  Commonly known as:  LIPITOR  Take 1 tablet (20 mg total) by mouth at  bedtime.     cyclobenzaprine 7.5 MG tablet  Commonly known as:  FEXMID  Take 1 tablet (7.5 mg total) by mouth 3 (three) times daily as needed for muscle spasms.     HYDROcodone-acetaminophen 5-325 MG tablet  Commonly known as:  NORCO/VICODIN  Take 1 tablet by mouth every 6 (six) hours as needed for severe pain.     lidocaine 5 %  Commonly known as:  LIDODERM  Place 1 patch onto the skin daily. Remove & Discard patch within 12 hours or as directed by MD     losartan 50 MG tablet  Commonly known as:  COZAAR  Take 1 tablet (50 mg total) by mouth daily.     ondansetron 4 MG tablet  Commonly known as:  ZOFRAN  Take 1 tablet (4 mg total) by mouth every 8 (eight) hours as needed for nausea or vomiting.     saccharomyces boulardii 250 MG capsule  Commonly known as:  FLORASTOR  Take 1 capsule (250 mg total) by mouth 2 (two) times daily.     vancomycin 50 mg/mL oral  solution  Commonly known as:  VANCOCIN  Take 125 mg PO 4 times daily for 2 weeks, followed by 125 mg PO BID x 7 days, followed by 125 mg PO q daily x 7 days, followed by 125 mg PO every other day for 7 days followed by 125 mg PO q 3 days for 15 days, then stop. Qty Sufficient for taper.           Objective:   Physical Exam BP 126/76 mmHg  Pulse 81  Temp(Src) 97.8 F (36.6 C) (Oral)  Ht 5\' 11"  (1.803 m)  Wt 200 lb 2 oz (90.776 kg)  BMI 27.92 kg/m2  SpO2 97% General:   Well developed, well nourished . NAD.  HEENT:  Normocephalic . Face symmetric, atraumatic Lungs:  CTA B Normal respiratory effort, no intercostal retractions, no accessory muscle use. Heart: RRR,  no murmur.  no pretibial edema bilaterally  Abdomen:  Not distended, soft, non-tender. No rebound or rigidity.   Skin: Not pale. Not jaundice MSK: + discomfort antalgic posture when he tries to lay down and the table due to back pain. + TTP at the right sacroiliac area. Neurologic:  alert & oriented X3.  Speech normal, gait appropriate for age and unassisted DTRs - motor symmetric  Psych--  Cognition and judgment appear intact.  Cooperative with normal attention span and concentration.  Behavior appropriate. No anxious or depressed appearing.     Assessment & Plan:   Assessment Hyperglycemia, A1c 5.9 (2015) HTN Hyperlipidemia GERD Hypothyroidism Goiter -  [id number] FNA FOLLICULAR LESION OF UNCERTAIN SIGNIFICANCE, and another site neg  2011 FNA (-) malignancy 09-2015 Bx (-) Leukocytosis, h/o thrombocytopenia - not seen by hematology as of 09-2015 CAD:  ---Cath  2010 nonobstructive CAD ---Cath 11-2014: Nonobstructive CAD, less than 50%, Rx aggressive medical therapy H/o TIA BPH Drug abuse Cluster headaches H/o Pulmonary nodule, CT 2016 stable, no further schedule CTS H/o Renal mass, incidental on CT chest 2016, [place] 2 benign cysts  Plan: Gastroenteritis: Was readmitted to hospital, eventually diagnosed  with C. Difficile on clinical bases, on vancomycin. Improving. Refer to GI Dr. Fuller Plan for outpt f/u Hyperlipidemia: Not taking Lipitor, prescription sent. Back pain as described above: developed it while he was in the hospital, occasional radiation to the right leg, he is tender at the sacroiliac joint. Given recent GI symptoms suspect  REITERS but ddx includes MSK pain and  radiculopathy. Plan : refer to rheumatology ASAP. If worse he will let us know. Goiter: Biopsy results reviewed with the patient and his wife Hypothyroidism? Last TSH is slightly suppressed, has not been taking medicine for a while, reassess in 3 weeks Leukocytosis: Chronic however the last CBC was normal. Recheck in the near future and again consider hematology referral RTC 3 weeks.

## 2015-11-09 NOTE — Assessment & Plan Note (Signed)
Gastroenteritis: Was readmitted to hospital, eventually diagnosed with C. Difficile on clinical bases, on vancomycin. Improving. Refer to GI Dr. Fuller Plan for outpt f/u Hyperlipidemia: Not taking Lipitor, prescription sent. Back pain as described above: developed it while he was in the hospital, occasional radiation to the right leg, he is tender at the sacroiliac joint. Given recent GI symptoms suspect  REITERS but ddx includes MSK pain and radiculopathy. Plan : refer to rheumatology ASAP. If worse he will let us know. Goiter: Biopsy results reviewed with the patient and his wife Hypothyroidism? Last TSH is slightly suppressed, has not been taking medicine for a while, reassess in 3 weeks Leukocytosis: Chronic however the last CBC was normal. Recheck in the near future and again consider hematology referral RTC 3 weeks.

## 2015-11-19 ENCOUNTER — Telehealth: Payer: Self-pay | Admitting: Internal Medicine

## 2015-11-19 ENCOUNTER — Ambulatory Visit: Payer: BLUE CROSS/BLUE SHIELD | Admitting: Internal Medicine

## 2015-11-19 NOTE — Telephone Encounter (Signed)
Called patient/wife. No answer,left message on answering machine.

## 2015-11-19 NOTE — Telephone Encounter (Signed)
Caller name:Bonnie Relationship to patient:wife Can be reached:[phone] Pharmacy:  Reason for call:unable to keep anything down, having diarrhea. Started yesterday. Was told to call back if he experienced these symptoms again. Please advise

## 2015-11-19 NOTE — Telephone Encounter (Signed)
Can you call Pt to triage? Has been in hospital x3 in last several weeks for same Sx's. Thank you.

## 2015-11-29 ENCOUNTER — Ambulatory Visit (INDEPENDENT_AMBULATORY_CARE_PROVIDER_SITE_OTHER): Payer: BLUE CROSS/BLUE SHIELD | Admitting: Physician Assistant

## 2015-11-29 ENCOUNTER — Encounter: Payer: Self-pay | Admitting: Physician Assistant

## 2015-11-29 VITALS — BP 110/70 | HR 84 | Ht 69.5 in | Wt 210.0 lb

## 2015-11-29 DIAGNOSIS — R197 Diarrhea, unspecified: Secondary | ICD-10-CM | POA: Diagnosis not present

## 2015-11-29 DIAGNOSIS — R5383 Other fatigue: Secondary | ICD-10-CM

## 2015-11-29 DIAGNOSIS — K219 Gastro-esophageal reflux disease without esophagitis: Secondary | ICD-10-CM

## 2015-11-29 DIAGNOSIS — R109 Unspecified abdominal pain: Secondary | ICD-10-CM | POA: Diagnosis not present

## 2015-11-29 MED ORDER — SACCHAROMYCES BOULARDII 250 MG PO CAPS: 250.0000 mg | ORAL_CAPSULE | Freq: Two times a day (BID) | ORAL | 0 refills | Status: DC

## 2015-11-29 MED ORDER — NA SULFATE-K SULFATE-MG SULF 17.5-3.13-1.6 GM/177ML PO SOLN: 1.0000 | Freq: Once | ORAL | 0 refills | Status: AC

## 2015-11-29 NOTE — Progress Notes (Signed)
Subjective:    Patient ID: [name], male    DOB: [birth year], 55 y.o.   MRN: [id number]  HPI [name]  is a 55 year old African-American male known remotely to [name] from EGD done in 2006 for dysphagia. He had dilation done for complaints of dysphagia but no stricture was noted. Patient comes in today for post hospital follow-up. He is actually had 3 hospitalizations over the past 2 months and was not seen by [place] during any of these admissions though he was seen by [name] in July. With initial admission he had nausea vomiting and diarrhea and was found to have entero pathogenic Escherichia coli and was treated with a course of Cipro and Flagyl. He was also noted to be C. difficile antigen positive and toxin negative. He resolved his symptoms but then was readmitted on 10/23/2015 with abrupt recurrence of nausea vomiting and diarrhea. At that time he was seen by [name] and no C. difficile PCR was negative it was felt that he should be treated with a course of vancomycin and was discharged on vancomycin 125 by mouth 4 times a day. About 2 weeks later he had another admission remained C. difficile PCR negative and GI pathogen panel was also negative but came back in with abdominal cramping and diarrhea area and he responded to IV fluids and decision was made to treat him with a long tapered dose of vancomycin. CT of the abdomen and pelvis was done in July 2017 and was otherwise unrevealing . Patient says he does feel better at this time though his energy level is still decreased. Continues to have intermittent abdominal cramping. His appetite is been good and weight has been stable. No fever or chills. He is continued to have some very intermittent nausea. Stools have improved and are now soft to loose 1-2 bowel movements per day without melena or hematochezia. He does have reflux symptoms but has been off of his PPI being treated for possible C. difficile. Appendectomy says he has an  appointment with [name] tomorrow to discuss his overall fatigue.   He has not had prior colonoscopy. Labs reviewed from July 17 and CBC and BMET were unremarkable.  Review of Systems Pertinent positive and negative review of systems were noted in the above HPI section.  All other review of systems was otherwise negative.  Outpatient Encounter Prescriptions as of 11/29/2015  Medication Sig  . aspirin EC 81 MG EC tablet Take 1 tablet (81 mg total) by mouth daily.  Marland Kitchen atorvastatin (LIPITOR) 20 MG tablet Take 1 tablet (20 mg total) by mouth at bedtime.  . cyclobenzaprine (FEXMID) 7.5 MG tablet Take 1 tablet (7.5 mg total) by mouth 3 (three) times daily as needed for muscle spasms.  Marland Kitchen losartan (COZAAR) 50 MG tablet Take 1 tablet (50 mg total) by mouth daily.  . ondansetron (ZOFRAN) 4 MG tablet Take 1 tablet (4 mg total) by mouth every 8 (eight) hours as needed for nausea or vomiting.  . vancomycin (VANCOCIN) 50 mg/mL oral solution Take 125 mg PO 4 times daily for 2 weeks, followed by 125 mg PO BID x 7 days, followed by 125 mg PO q daily x 7 days, followed by 125 mg PO every other day for 7 days followed by 125 mg PO q 3 days for 15 days, then stop. Qty Sufficient for taper.  . Na Sulfate-K Sulfate-Mg Sulf 17.5-3.13-1.6 GM/180ML SOLN Take 1 kit by mouth once.  . saccharomyces boulardii (FLORASTOR) 250 MG capsule  Take 1 capsule (250 mg total) by mouth 2 (two) times daily.  . [DISCONTINUED] HYDROcodone-acetaminophen (NORCO/VICODIN) 5-325 MG tablet Take 1 tablet by mouth every 6 (six) hours as needed for severe pain. (Patient not taking: Reported on 11/08/2015)  . [DISCONTINUED] lidocaine (LIDODERM) 5 % Place 1 patch onto the skin daily. Remove & Discard patch within 12 hours or as directed by MD (Patient not taking: Reported on 11/08/2015)  . [DISCONTINUED] saccharomyces boulardii (FLORASTOR) 250 MG capsule Take 1 capsule (250 mg total) by mouth 2 (two) times daily.   No facility-administered encounter  medications on file as of 11/29/2015.    No Known Allergies Patient Active Problem List   Diagnosis Date Noted  . Enteritis due to Clostridium difficile 10/30/2015  . Malnutrition of moderate degree 10/21/2015  . Vomiting and diarrhea 10/20/2015  . Acute kidney injury ([place]) 10/20/2015  . Noninfectious gastroenteritis and colitis 10/20/2015  . Gastroenteritis 10/20/2015  . Dehydration   . PCP NOTES >>>>>>>>>>>>>>>>> 09/30/2015  . Nausea vomiting and diarrhea 09/22/2015  . Abdominal pain 09/22/2015  . HLD (hyperlipidemia) 09/22/2015  . Renal mass, left 11/02/2014  . CAD (coronary artery disease)   . Drug abuse and dependence ([place]) 09/18/2014  . Visit for preventive health examination 02/04/2014  . Screening for ischemic heart disease 02/04/2014  . BPH associated with nocturia 12/24/2013  . Prostate cancer screening 12/24/2013  . Hypothyroidism 12/24/2013  . Chronic ethmoidal sinusitis 12/24/2013  . Colon cancer screening 12/24/2013  . Low back pain radiating to right leg 05/10/2012  . Thrombocytopenia ([place]) 12/05/2011  . Tobacco abuse 11/30/2011  . Anxiety 12/26/2010  . Transient cerebral ischemia 01/07/2010  . Asthma 01/19/2009  . History of other specified conditions presenting hazards to health 01/18/2009  . Pulmonary nodule --- CT 10/19/2014: Nodule is stable, no further routine x-rays 01/06/2009  . Nontoxic multinodular goiter 12/31/2008  . DYSPNEA ON EXERTION 12/15/2008  . Trigeminal neuralgia 07/24/2007  . Dyslipidemia 12/27/2006  . Cluster headache 12/27/2006  . Essential hypertension 12/27/2006  . GERD 12/27/2006   Social History   Social History  . Marital status: Married    Spouse name: N/A  . Number of children: 2  . 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 Smoking status: Current Some Day Smoker    Packs/day: 0.25    Years: 18.00    Types: Cigarettes  . Smokeless tobacco: Never  Used     Comment:  occ smoker 1-2 cigs per week  . Alcohol use No  . Drug use: No  . Sexual activity: Not on file   Other Topics Concern  . Not on file   Social History Narrative   HSG   Culinary school in [place]   Married - '84 - 5 years divorced; remarried '96   1 son - '85                 Mr. Garriga family history includes Cancer in his father; Diabetes in his mother; Heart disease in his father and maternal aunt; Hyperlipidemia in his mother; Hypertension in his father and mother.      Objective:    Vitals:   11/29/15 0859  BP: 110/70  Pulse: 84    Physical Exam  well-developed African-American male in no acute distress, pleasant blood pressure 110/70 pulse 84 BMI 30.5. HEENT; nontraumatic normocephalic EOMI PERRLA sclera anicteric, Cardiovascular ;regular rate and rhythm with S1-S2 no murmur  or gallop, Pulmonary ;clear bilaterally, Abdomen,; there is no focal tenderness no guarding or rebound no palpable mass or hepatosplenomegaly bowel sounds are present has an appendectomy incisional scar and a midline incisional scar with a small umbilical hernia, Rectal; exam not done, Ext; no clubbing cyanosis or edema skin warm and dry, Neuropsych ;mood and affect appropriate       Assessment & [name]:   #45  55 year old African-American male who was diagnosed with entero pathogenic Escherichia coli (EPEC) June 2017 at which time he was admitted with nausea vomiting diarrhea. He responded to a  course of Cipro and Flagyl was hospitalized about 2 weeks later with recurrence of nausea vomiting and diarrhea. C. difficile PCR was negative GI pathogen panel negative but felt to be at increased risk for C. difficile as he had also had a previous positive C. difficile antigen. It was opted to treat him with empiric vancomycin. He is now on a long six-week tapered course of oral vancomycin. He has had significant improvement in symptoms but not complete resolution with some continued  intermittent loose stools and nausea as well as abdominal cramping.  Etiology of current symptoms is not clear he certainly may have a postinfectious IBS type picture, will rule out underlying IBD, occult lesion.  #2 hypertension #3 BPH #4 TIA #5 history of trigeminal neuralgia #6 remote EGD for dysphagia with dilation but no stricture noted 2006 #7 GERD  [name]; patient will complete current tapered regimen of vancomycin, he has about a week and a half left of this course Start floor store one by mouth twice a day 1 month We'll schedule for colonoscopy with [name]. Procedure discussed in detail with patient including risks and benefits and he is agreeable to proceed. We will purposely schedule colonoscopy out 3-4 weeks Okay to resume Prevacid after he completes vancomycin.  Yair Dusza S Vela Render PA-C 11/29/2015   Cc: Colon Branch, MD

## 2015-11-29 NOTE — Patient Instructions (Signed)
We sent a prescription to [facility]. 1. Suprep for the colonoscopy 2. Florastor   Finish the Vancomycin taper. You can restart the Prevacid after you finish the Vancomycin.   You have been scheduled for a colonoscopy. Please follow written instructions given to you at your visit today.  Please pick up your prep supplies at the pharmacy within the next 1-3 days. If you use inhalers (even only as needed), please bring them with you on the day of your procedure. Your physician has requested that you go to www.startemmi.com and enter the access code given to you at your visit today. This web site gives a general overview about your procedure. However, you should still follow specific instructions given to you by our office regarding your preparation for the procedure.   I  If you are age 37 or younger, your body mass index should be between 19-25. Your Body mass index is 30.57 kg/m. If this is out of the aformentioned range listed, please consider follow up with your Primary Care Provider.

## 2015-11-30 ENCOUNTER — Ambulatory Visit (INDEPENDENT_AMBULATORY_CARE_PROVIDER_SITE_OTHER): Payer: BLUE CROSS/BLUE SHIELD | Admitting: Internal Medicine

## 2015-11-30 VITALS — BP 118/76 | HR 90 | Temp 98.1°F | Resp 12 | Ht 71.0 in | Wt 207.5 lb

## 2015-11-30 DIAGNOSIS — E039 Hypothyroidism, unspecified: Secondary | ICD-10-CM

## 2015-11-30 DIAGNOSIS — R5383 Other fatigue: Secondary | ICD-10-CM

## 2015-11-30 DIAGNOSIS — M545 Low back pain, unspecified: Secondary | ICD-10-CM

## 2015-11-30 DIAGNOSIS — K529 Noninfective gastroenteritis and colitis, unspecified: Secondary | ICD-10-CM | POA: Diagnosis not present

## 2015-11-30 LAB — CBC WITH DIFFERENTIAL/PLATELET
Basophils Absolute: 0.1 10*3/uL (ref 0.0–0.1)
Basophils Relative: 0.5 % (ref 0.0–3.0)
Eosinophils Absolute: 0.1 10*3/uL (ref 0.0–0.7)
Eosinophils Relative: 0.5 % (ref 0.0–5.0)
HCT: 41.5 % (ref 39.0–52.0)
Hemoglobin: 14.1 g/dL (ref 13.0–17.0)
Lymphocytes Relative: 29.7 % (ref 12.0–46.0)
Lymphs Abs: 3.6 10*3/uL (ref 0.7–4.0)
MCHC: 34 g/dL (ref 30.0–36.0)
MCV: 90.4 fl (ref 78.0–100.0)
Monocytes Absolute: 0.7 10*3/uL (ref 0.1–1.0)
Monocytes Relative: 6.2 % (ref 3.0–12.0)
Neutro Abs: 7.7 10*3/uL (ref 1.4–7.7)
Neutrophils Relative %: 63.1 % (ref 43.0–77.0)
Platelets: 182 10*3/uL (ref 150.0–400.0)
RBC: 4.58 Mil/uL (ref 4.22–5.81)
RDW: 16 % — ABNORMAL HIGH (ref 11.5–15.5)
WBC: 12.2 10*3/uL — ABNORMAL HIGH (ref 4.0–10.5)

## 2015-11-30 LAB — T4, FREE: Free T4: 1.06 ng/dL (ref 0.60–1.60)

## 2015-11-30 LAB — C-REACTIVE PROTEIN: CRP: 0.3 mg/dL — ABNORMAL LOW (ref 0.5–20.0)

## 2015-11-30 LAB — FOLATE: Folate: 17.1 ng/mL (ref >5.9)

## 2015-11-30 LAB — SEDIMENTATION RATE: Sed Rate: 10 mm/hr (ref 0–20)

## 2015-11-30 LAB — T3, FREE: T3, Free: 3.7 pg/mL (ref 2.3–4.2)

## 2015-11-30 LAB — TSH: TSH: 1 u[IU]/mL (ref 0.35–4.50)

## 2015-11-30 LAB — VITAMIN B12: Vitamin B-12: 839 pg/mL (ref 211–911)

## 2015-11-30 NOTE — Progress Notes (Signed)
Subjective:    Patient ID: [name], male    DOB: [birth year], 55 y.o.   MRN: [id number]  DOS:  11/30/2015 Type of visit - description : f/u Interval history: GI symptoms: Note from GI reviewed, symptoms have decreased but he is not back to baseline. Back pain: Went to see rheumatology, additional labs were done, continue with right-sided back pain, wakes up with it, slightly better as he moves and as the day goes by. Does have discomfort at night when he lays down. No radiation although occasionally has tingling in the right leg. Still concerned about his thyroid and wonder if it is that is making him tired Reports fatigue for the last 3-4 months, simply lack of energy.   Review of Systems  Does not recall being told he snores excessively.  Past Medical History:  Diagnosis Date  . BPH (benign prostatic hyperplasia)   . Chest pain, atypical   . Cluster headache    hx of  . Complication of anesthesia    difficult waking up"  . Drug abuse    hx of  . Dyspnea on exertion   . GERD (gastroesophageal reflux disease)   . Hiatal hernia   . Hyperlipidemia   . Hypertension   . Hypothyroidism   . Nontoxic multinodular goiter   . Pulmonary nodule --- CT 10/19/2014: Nodule is stable, no further routine x-rays 01/06/2009  . TIA (transient ischemic attack)   . Trigeminal neuralgia   . Unspecified asthma(493.90)     Past Surgical History:  Procedure Laterality Date  . APPENDECTOMY    . bowel obstruction    . CARDIAC CATHETERIZATION N/A 11/25/2014   Procedure: Left Heart Cath and Coronary Angiography;  Surgeon: Belva Crome, MD;  Location: [facility];  Service: Cardiovascular;  Laterality: N/A;  . DESTRUCTION TRIGEMINAL NERVE VIA NEUROLYTIC AGENT  x 3 , R side last ~2010  . KNEE ARTHROSCOPY Right   . 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 Right Shoulder Redo-Reconstruction  06/21/10    Social History   Social History  . Marital status:  Married    Spouse name: N/A  . Number of children: 2  . 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 Smoking status: Current Some Day Smoker    Packs/day: 0.25    Years: 18.00    Types: Cigarettes  . Smokeless tobacco: Never Used     Comment:  occ smoker 1-2 cigs per week  . Alcohol use No  . Drug use: No  . Sexual activity: Not on file   Other Topics Concern  . Not on file   Social History Narrative   HSG   Culinary school in [place]   Married - '84 - 5 years divorced; remarried '96   1 son - '85                     Medication List       Accurate as of 11/30/15  2:12 PM. Always use your most recent med list.          aspirin 81 MG EC tablet Take 1 tablet (81 mg total) by mouth daily.   atorvastatin 20 MG tablet Commonly known as:  LIPITOR Take 1 tablet (20 mg total) by mouth at bedtime.   cyclobenzaprine 7.5 MG tablet Commonly known as:  FEXMID Take  1 tablet (7.5 mg total) by mouth 3 (three) times daily as needed for muscle spasms.   losartan 50 MG tablet Commonly known as:  COZAAR Take 1 tablet (50 mg total) by mouth daily.   ondansetron 4 MG tablet Commonly known as:  ZOFRAN Take 1 tablet (4 mg total) by mouth every 8 (eight) hours as needed for nausea or vomiting.   saccharomyces boulardii 250 MG capsule Commonly known as:  FLORASTOR Take 1 capsule (250 mg total) by mouth 2 (two) times daily.   vancomycin 50 mg/mL oral solution Commonly known as:  VANCOCIN Take 125 mg PO 4 times daily for 2 weeks, followed by 125 mg PO BID x 7 days, followed by 125 mg PO q daily x 7 days, followed by 125 mg PO every other day for 7 days followed by 125 mg PO q 3 days for 15 days, then stop. Qty Sufficient for taper.          Objective:   Physical Exam BP 118/76 (BP Location: Right Arm, Patient Position: Sitting, Cuff Size: Normal)   Pulse 90   Temp 98.1 F (36.7 C) (Oral)   Resp 12    Ht 5\' 11"  (1.803 m)   Wt 207 lb 8 oz (94.1 kg)   SpO2 94%   BMI 28.94 kg/m  General:   Well developed, well nourished . NAD.  HEENT:  Normocephalic . Face symmetric, atraumatic Lungs:  CTA B Normal respiratory effort, no intercostal retractions, no accessory muscle use. Heart: RRR,  no murmur.  No pretibial edema bilaterally  Back: Slightly TTP at the right sacroiliac area. No tender at the thoracolumbar spine. Skin: Not pale. Not jaundice Neurologic:  alert & oriented X3.  Speech normal, gait appropriate for age and unassisted. DTRs symmetric Psych--  Cognition and judgment appear intact.  Cooperative with normal attention span and concentration.  Behavior appropriate. No anxious or depressed appearing.      Assessment & Plan:   Assessment Hyperglycemia, A1c 5.9 (2015) HTN Hyperlipidemia GERD Hypothyroidism Goiter -  [id number] FNA FOLLICULAR LESION OF UNCERTAIN SIGNIFICANCE, and another site neg  2011 FNA (-) malignancy 09-2015 Bx (-) Leukocytosis, h/o thrombocytopenia - not seen by hematology as of 09-2015 CAD:  ---Cath  2010 nonobstructive CAD ---Cath 11-2014: Nonobstructive CAD, less than 50%, Rx aggressive medical therapy H/o TIA BPH Drug abuse Cluster headaches H/o Pulmonary nodule, CT 2016 stable, no further schedule CTS H/o Renal mass, incidental on CT chest 2016, [place] 2 benign cysts   PLAN Gastroenteritis: Better than the still has some symptoms, just saw GI, post infection IBS? They're planning to a colonoscopy in few weeks. Recommend patient to follow-up with GI as recommended Hypothyroidism?: On no supplements, check a TSH, free T3, free T4 Back pain: Saw rheumatology, further labs were done, has a follow-up with them in few weeks. I'm still somewhat concerned about the pain features  (nocturnal pain). He is tender at the SI, will check a CBC, sedimentation rate and CRP. Leukocytosis: Repeating a CBC today Fatigue: x 4  months, Epworth scale 9 (borderline  positive). No clear-cut history for snoring. Will check 123456, folic acid, vitamin D.  RTC 2 mohths

## 2015-11-30 NOTE — Patient Instructions (Signed)
GO TO THE LAB : Get the blood work     GO TO THE FRONT DESK Schedule your next appointment for a   checkup in 2 months 

## 2015-11-30 NOTE — Progress Notes (Signed)
Pre visit review using our clinic review tool, if applicable. No additional management support is needed unless otherwise documented below in the visit note. 

## 2015-12-01 NOTE — Assessment & Plan Note (Signed)
Gastroenteritis: Better than the still has some symptoms, just saw GI, post infection IBS? They're planning to a colonoscopy in few weeks. Recommend patient to follow-up with GI as recommended Hypothyroidism?: On no supplements, check a TSH, free T3, free T4 Back pain: Saw rheumatology, further labs were done, has a follow-up with them in few weeks. I'm still somewhat concerned about the pain features  (nocturnal pain). He is tender at the SI, will check a CBC, sedimentation rate and CRP. Leukocytosis: Repeating a CBC today Fatigue: x 4  months, Epworth scale 9 (borderline positive). No clear-cut history for snoring. Will check 123456, folic acid, vitamin D.  RTC 2 mohths

## 2015-12-02 ENCOUNTER — Encounter: Payer: Self-pay | Admitting: Internal Medicine

## 2015-12-02 LAB — VITAMIN D 1,25 DIHYDROXY
Vitamin D 1, 25 (OH)2 Total: 54 pg/mL (ref 18–72)
Vitamin D2 1, 25 (OH)2: <8 pg/mL
Vitamin D3 1, 25 (OH)2: 54 pg/mL

## 2015-12-02 NOTE — Telephone Encounter (Signed)
Patient went to ED and hs been seen in office since phone call below.

## 2015-12-06 ENCOUNTER — Telehealth: Payer: Self-pay

## 2015-12-06 NOTE — Telephone Encounter (Signed)
OK with me.

## 2015-12-06 NOTE — Progress Notes (Signed)
Reviewed and agree with management plan.  Timmie Calix T. Chrissie Dacquisto, MD FACG 

## 2015-12-06 NOTE — Telephone Encounter (Signed)
Patient's wife is requesting patient be transferred to [place] wife is already a patient of Charlett Blake.

## 2015-12-06 NOTE — Telephone Encounter (Signed)
Called the patient to notify of approval

## 2015-12-06 NOTE — Telephone Encounter (Signed)
[facility] w/ me, thank you

## 2015-12-17 ENCOUNTER — Encounter: Payer: Self-pay | Admitting: Gastroenterology

## 2015-12-24 DIAGNOSIS — D126 Benign neoplasm of colon, unspecified: Secondary | ICD-10-CM

## 2015-12-24 HISTORY — DX: Benign neoplasm of colon, unspecified: D12.6

## 2015-12-29 ENCOUNTER — Encounter: Payer: BLUE CROSS/BLUE SHIELD | Admitting: Gastroenterology

## 2016-01-05 ENCOUNTER — Ambulatory Visit (AMBULATORY_SURGERY_CENTER): Payer: BLUE CROSS/BLUE SHIELD | Admitting: Gastroenterology

## 2016-01-05 ENCOUNTER — Encounter: Payer: Self-pay | Admitting: Gastroenterology

## 2016-01-05 VITALS — BP 100/64 | HR 61 | Temp 97.7°F | Resp 16 | Ht 69.0 in | Wt 210.0 lb

## 2016-01-05 DIAGNOSIS — K621 Rectal polyp: Secondary | ICD-10-CM | POA: Diagnosis not present

## 2016-01-05 DIAGNOSIS — D123 Benign neoplasm of transverse colon: Secondary | ICD-10-CM | POA: Diagnosis not present

## 2016-01-05 DIAGNOSIS — D125 Benign neoplasm of sigmoid colon: Secondary | ICD-10-CM

## 2016-01-05 DIAGNOSIS — R197 Diarrhea, unspecified: Secondary | ICD-10-CM | POA: Diagnosis present

## 2016-01-05 DIAGNOSIS — D128 Benign neoplasm of rectum: Secondary | ICD-10-CM

## 2016-01-05 MED ORDER — SODIUM CHLORIDE 0.9 % IV SOLN: 500.0000 mL | INTRAVENOUS | Status: DC

## 2016-01-05 NOTE — Progress Notes (Signed)
Report to PACU, RN, vss, BBS= Clear.  

## 2016-01-05 NOTE — Progress Notes (Signed)
Called to room to assist during endoscopic procedure.  Patient ID and intended procedure confirmed with present staff. Received instructions for my participation in the procedure from the performing physician.  

## 2016-01-05 NOTE — Op Note (Signed)
[facility] Patient Name: [name] Procedure Date: 01/05/2016 11:59 AM MRN: [id number] Endoscopist: Ladene Artist , MD Age: 55 Referring MD:  Date of Birth: May 04, 1960 Gender: Male Account #: [id number] Procedure:                Colonoscopy Indications:              Clinically significant diarrhea of unexplained                            origin Medicines:                Monitored Anesthesia Care Procedure:                Pre-Anesthesia Assessment:                           - Prior to the procedure, a History and Physical                            was performed, and patient medications and                            allergies were reviewed. The patient's tolerance of                            previous anesthesia was also reviewed. The risks                            and benefits of the procedure and the sedation                            options and risks were discussed with the patient.                            All questions were answered, and informed consent                            was obtained. Prior Anticoagulants: The patient has                            taken no previous anticoagulant or antiplatelet                            agents. ASA Grade Assessment: II - A patient with                            mild systemic disease. After reviewing the risks                            and benefits, the patient was deemed in                            satisfactory condition to undergo the procedure.  After obtaining informed consent, the colonoscope                            was passed under direct vision. Throughout the                            procedure, the patient's blood pressure, pulse, and                            oxygen saturations were monitored continuously. The                            Model PCF-H190L [id number]) scope was introduced                            through the anus and advanced to the the terminal                           ileum. The ileocecal valve, appendiceal orifice,                            and rectum were photographed. The quality of the                            bowel preparation was adequate. The colonoscopy was                            performed without difficulty. The patient tolerated                            the procedure well. Scope In: 1:48:48 PM Scope Out: 2:05:09 PM Scope Withdrawal Time: 0 hours 13 minutes 45 seconds  Total Procedure Duration: 0 hours 16 minutes 21 seconds  Findings:                 Five sessile polyps were found in the rectum (2),                            sigmoid colon (1) and transverse colon (2). The                            polyps were 5 to 8 mm in size. These polyps were                            removed with a cold snare. Resection and retrieval                            were complete.                           Internal hemorrhoids were found during                            retroflexion. The hemorrhoids were mild and Grade I                            (  internal hemorrhoids that do not prolapse).                           Multiple medium-mouthed diverticula were found in                            the sigmoid colon and descending colon.                           The perianal and digital rectal examinations were                            normal.                           The terminal ileum appeared normal.                           The exam was otherwise normal throughout the                            examined colon.                           Biopsies for histology were taken with a cold                            forceps from the ascending colon, transverse colon,                            descending colon and sigmoid colon for evaluation                            of microscopic colitis. Complications:            No immediate complications. Estimated blood loss:                            None. Estimated Blood Loss:      Estimated blood loss: none. Impression:               - Five 5 to 8 mm polyps in the rectum, in the                            sigmoid colon and in the transverse colon, removed                            with a cold snare. Resected and retrieved.                           - Internal hemorrhoids.                           - Diverticulosis in the sigmoid colon and in the  descending colon.                           - The examined portion of the ileum was normal.                           - Biopsies were taken with a cold forceps from the                            ascending colon, transverse colon, descending colon                            and sigmoid colon for evaluation of microscopic                            colitis. Recommendation:           - Repeat colonoscopy in 5 years for surveillance if                            polyps are precancerous, otherwise 10 years.                           - Patient has a contact number available for                            emergencies. The signs and symptoms of potential                            delayed complications were discussed with the                            patient. Return to normal activities tomorrow.                            Written discharge instructions were provided to the                            patient.                           - Resume previous diet.                           - Continue present medications.                           - Await pathology results. Ladene Artist, MD 01/05/2016 2:10:58 PM This report has been signed electronically.

## 2016-01-05 NOTE — Patient Instructions (Signed)
YOU HAD AN ENDOSCOPIC PROCEDURE TODAY AT THE [facility]:   Refer to the procedure report that was given to you for any specific questions about what was found during the examination.  If the procedure report does not answer your questions, please call your gastroenterologist to clarify.  If you requested that your care partner not be given the details of your procedure findings, then the procedure report has been included in a sealed envelope for you to review at your convenience later.  YOU SHOULD EXPECT: Some feelings of bloating in the abdomen. Passage of more gas than usual.  Walking can help get rid of the air that was put into your GI tract during the procedure and reduce the bloating. If you had a lower endoscopy (such as a colonoscopy or flexible sigmoidoscopy) you may notice spotting of blood in your stool or on the toilet paper. If you underwent a bowel prep for your procedure, you may not have a normal bowel movement for a few days.  Please Note:  You might notice some irritation and congestion in your nose or some drainage.  This is from the oxygen used during your procedure.  There is no need for concern and it should clear up in a day or so.  SYMPTOMS TO REPORT IMMEDIATELY:   Following lower endoscopy (colonoscopy or flexible sigmoidoscopy):  Excessive amounts of blood in the stool  Significant tenderness or worsening of abdominal pains  Swelling of the abdomen that is new, acute  Fever of 100F or higher  For urgent or emergent issues, a gastroenterologist can be reached at any hour by calling [phone].   DIET:  We do recommend a small meal at first, but then you may proceed to your regular diet.  Drink plenty of fluids but you should avoid alcoholic beverages for 24 hours.  ACTIVITY:  You should plan to take it easy for the rest of today and you should NOT DRIVE or use heavy machinery until tomorrow (because of the sedation medicines used during the test).     FOLLOW UP: Our staff will call the number listed on your records the next business day following your procedure to check on you and address any questions or concerns that you may have regarding the information given to you following your procedure. If we do not reach you, we will leave a message.  However, if you are feeling well and you are not experiencing any problems, there is no need to return our call.  We will assume that you have returned to your regular daily activities without incident.  If any biopsies were taken you will be contacted by phone or by letter within the next 1-3 weeks.  Please call us at [phone] if you have not heard about the biopsies in 3 weeks.    SIGNATURES/CONFIDENTIALITY: You and/or your care partner have signed paperwork which will be entered into your electronic medical record.  These signatures attest to the fact that that the information above on your After Visit Summary has been reviewed and is understood.  Full responsibility of the confidentiality of this discharge information lies with you and/or your care-partner. 

## 2016-01-06 ENCOUNTER — Telehealth: Payer: Self-pay | Admitting: *Deleted

## 2016-01-06 NOTE — Telephone Encounter (Signed)
No answer. Name identifier. Message left to call if questions or concerns. 

## 2016-01-07 ENCOUNTER — Ambulatory Visit: Payer: BLUE CROSS/BLUE SHIELD | Admitting: Family Medicine

## 2016-01-14 ENCOUNTER — Encounter: Payer: Self-pay | Admitting: Gastroenterology

## 2016-01-24 ENCOUNTER — Telehealth: Payer: Self-pay | Admitting: Behavioral Health

## 2016-01-24 NOTE — Telephone Encounter (Signed)
Unable to reach patient at time of Pre-Visit Call.  Left message for patient to return call when available.    

## 2016-01-25 ENCOUNTER — Ambulatory Visit: Payer: BLUE CROSS/BLUE SHIELD | Admitting: Family Medicine

## 2016-01-25 DIAGNOSIS — Z0289 Encounter for other administrative examinations: Secondary | ICD-10-CM

## 2016-01-26 ENCOUNTER — Encounter: Payer: Self-pay | Admitting: Internal Medicine

## 2016-02-04 ENCOUNTER — Ambulatory Visit: Payer: BLUE CROSS/BLUE SHIELD | Admitting: Internal Medicine

## 2016-04-24 DIAGNOSIS — I214 Non-ST elevation (NSTEMI) myocardial infarction: Secondary | ICD-10-CM

## 2016-04-24 HISTORY — DX: Non-ST elevation (NSTEMI) myocardial infarction: I21.4

## 2016-06-23 ENCOUNTER — Encounter: Payer: Self-pay | Admitting: Family Medicine

## 2016-06-23 ENCOUNTER — Ambulatory Visit (INDEPENDENT_AMBULATORY_CARE_PROVIDER_SITE_OTHER): Payer: BLUE CROSS/BLUE SHIELD | Admitting: Family Medicine

## 2016-06-23 VITALS — BP 146/80 | HR 83 | Temp 97.7°F | Ht 71.0 in | Wt 218.2 lb

## 2016-06-23 DIAGNOSIS — H1031 Unspecified acute conjunctivitis, right eye: Secondary | ICD-10-CM

## 2016-06-23 DIAGNOSIS — H1131 Conjunctival hemorrhage, right eye: Secondary | ICD-10-CM

## 2016-06-23 DIAGNOSIS — R030 Elevated blood-pressure reading, without diagnosis of hypertension: Secondary | ICD-10-CM

## 2016-06-23 MED ORDER — TOBRAMYCIN 0.3 % OP SOLN: 1.0000 [drp] | OPHTHALMIC | 0 refills | Status: AC

## 2016-06-23 NOTE — Patient Instructions (Addendum)
Continue using artificial tears.  If you start having headaches, fevers, or any other change, let us know or seek immediate care. The blood in the eye can take several weeks to go away.

## 2016-06-23 NOTE — Progress Notes (Signed)
Pre visit review using our clinic review tool, if applicable. No additional management support is needed unless otherwise documented below in the visit note. 

## 2016-06-23 NOTE — Progress Notes (Signed)
Chief Complaint  Patient presents with  . Eye Problem    red and painful    [name] is here for right eye irritation.  Duration: 1 day Chemical exposure? No  Recent URI? No  Contact lenses? No  History of allergies? No  Treatment to date: Artificial tears He has a hx of trigeminal nerve surgery, poor sensation over face and eyes. He has been having crusting eyes in AM as well. No FB exposure or injury.  He does not check his BP at home.  ROS:  Eyes: As noted above  Past Medical History:  Diagnosis Date  . BPH (benign prostatic hyperplasia)   . Chest pain, atypical   . Cluster headache    hx of  . Complication of anesthesia    difficult waking up"  . Drug abuse    hx of  . Dyspnea on exertion   . GERD (gastroesophageal reflux disease)   . Hiatal hernia   . Hyperlipidemia   . Hypertension   . Hypothyroidism   . Nontoxic multinodular goiter   . Pulmonary nodule --- CT 10/19/2014: Nodule is stable, no further routine x-rays 01/06/2009  . TIA (transient ischemic attack)   . Trigeminal neuralgia   . Unspecified asthma(493.90)    Family History  Problem Relation Age of Onset  . Diabetes Mother   . Hyperlipidemia Mother   . Hypertension Mother   . Hypertension Father   . Heart disease Father     CAD/CHF  . Cancer Father     Mesothelioma  . Heart disease Maternal Aunt   . Prostate cancer Neg Hx   . Colon cancer Neg Hx     BP (!) 146/80 (BP Location: Right Arm, Patient Position: Sitting, Cuff Size: Large)   Pulse 83   Temp 97.7 F (36.5 C) (Oral)   Ht 5\' 11"  (1.803 m)   Wt 218 lb 3.2 oz (99 kg)   SpO2 98%   BMI 30.43 kg/m  Gen: Awake, alert, appears stated age Eyes: Lids unremarkable, Sclera with hemorrhage R eye laterally, PERRLA, EOMi; fluorescent exam shows no abrasion or ulcer. No drainage. Vision intact, no chemosis or tearing. Nose: Nares patent without discharge Mouth: MMM, pharynx without erythema or exudate Psych: Age appropriate judgment  and insight; mood and affect normal  Acute conjunctivitis of right eye, unspecified acute conjunctivitis type - Plan: tobramycin (TOBREX) 0.3 % ophthalmic solution  Subconjunctival hematoma, right  Elevated blood pressure reading  Orders as above. Given his interesting situation, will provide abx drops.  Artificial tears also recommended, which he has been using. F/u if no improvement in 7-10 days. F/u in 2 weeks for BP check with nurse. Pt voiced understanding and agreement to the plan.  [place], DO 06/23/16 5:20 PM

## 2016-09-05 ENCOUNTER — Encounter: Payer: Self-pay | Admitting: Family

## 2016-09-05 ENCOUNTER — Ambulatory Visit (INDEPENDENT_AMBULATORY_CARE_PROVIDER_SITE_OTHER): Payer: BLUE CROSS/BLUE SHIELD | Admitting: Family

## 2016-09-05 VITALS — BP 167/102 | HR 82 | Temp 98.4°F | Resp 18 | Ht 71.0 in | Wt 220.8 lb

## 2016-09-05 DIAGNOSIS — I10 Essential (primary) hypertension: Secondary | ICD-10-CM

## 2016-09-05 DIAGNOSIS — K219 Gastro-esophageal reflux disease without esophagitis: Secondary | ICD-10-CM

## 2016-09-05 DIAGNOSIS — L989 Disorder of the skin and subcutaneous tissue, unspecified: Secondary | ICD-10-CM | POA: Diagnosis not present

## 2016-09-05 DIAGNOSIS — G4700 Insomnia, unspecified: Secondary | ICD-10-CM

## 2016-09-05 DIAGNOSIS — E785 Hyperlipidemia, unspecified: Secondary | ICD-10-CM | POA: Diagnosis not present

## 2016-09-05 LAB — LIPID PANEL
Cholesterol: 266 mg/dL — ABNORMAL HIGH (ref 0–200)
HDL: 74.3 mg/dL (ref >39.00)
LDL Cholesterol: 176 mg/dL — ABNORMAL HIGH (ref 0–99)
NonHDL: 191.52
Total CHOL/HDL Ratio: 4
Triglycerides: 76 mg/dL (ref 0.0–149.0)
VLDL: 15.2 mg/dL (ref 0.0–40.0)

## 2016-09-05 LAB — BASIC METABOLIC PANEL
BUN: 19 mg/dL (ref 6–23)
CO2: 28 mEq/L (ref 19–32)
Calcium: 9 mg/dL (ref 8.4–10.5)
Chloride: 103 mEq/L (ref 96–112)
Creatinine, Ser: 1.04 mg/dL (ref 0.40–1.50)
GFR: 95.92 mL/min (ref >60.00)
Glucose, Bld: 102 mg/dL — ABNORMAL HIGH (ref 70–99)
Potassium: 4 mEq/L (ref 3.5–5.1)
Sodium: 137 mEq/L (ref 135–145)

## 2016-09-05 MED ORDER — LANSOPRAZOLE 30 MG PO CPDR: 30.0000 mg | DELAYED_RELEASE_CAPSULE | Freq: Every day | ORAL | 5 refills | Status: DC

## 2016-09-05 MED ORDER — LOSARTAN POTASSIUM 50 MG PO TABS: 50.0000 mg | ORAL_TABLET | Freq: Every day | ORAL | 5 refills | Status: DC

## 2016-09-05 MED ORDER — TRAZODONE HCL 50 MG PO TABS: 25.0000 mg | ORAL_TABLET | Freq: Every evening | ORAL | 3 refills | Status: DC | PRN

## 2016-09-05 MED ORDER — DOXYCYCLINE HYCLATE 100 MG PO TABS: 100.0000 mg | ORAL_TABLET | Freq: Two times a day (BID) | ORAL | 0 refills | Status: DC

## 2016-09-05 MED ORDER — ATORVASTATIN CALCIUM 20 MG PO TABS: 20.0000 mg | ORAL_TABLET | Freq: Every day | ORAL | 5 refills | Status: DC

## 2016-09-05 NOTE — Patient Instructions (Addendum)
Please complete lab work prior to leaving. Restart blood pressure medications and cholesterol medications. Begin doxycycline for the spot on your face.

## 2016-09-05 NOTE — Progress Notes (Signed)
Subjective:    Patient ID: [name], male    DOB: [birth year], 56 y.o.   MRN: [id number]  HPI  [name] is a 56 yr old male who presents today with several concerns:  Facial lesion- notes area beside lip which began 1 month ago. More recently he has noticed some mild increased swelling. Sometimes area will be tender. Denies associated drainage.    HTN- Out of medications x 2 days.  BP Readings from Last 3 Encounters:  09/05/16 (!) 167/102  06/23/16 (!) 146/80  01/05/16 100/64   GERD-  Was uncontrolled off prevacid. Now back on prevacid and symptoms are improved. Requesting Rx.    Hyperlipidemia- reports that he has been out of his statin x 2 weeks Lab Results  Component Value Date   CHOL 299 (H) 09/23/2015   HDL 65 09/23/2015   LDLCALC 219 (H) 09/23/2015   LDLDIRECT 209.8 07/20/2009   TRIG 73 09/23/2015   CHOLHDL 4.6 09/23/2015   Trouble falling asleep-  Works 9-10 AM to 8 PM, 6 days a week.  Lays down at night around 10-11 PM.  No caffeine.  Rare alcohol use.  No screens before bedtime.  No daytime naps.  Has tried melatonin without improvement.       Review of Systems See HPI  Past Medical History:  Diagnosis Date  . BPH (benign prostatic hyperplasia)   . Chest pain, atypical   . Cluster headache    hx of  . Complication of anesthesia    difficult waking up"  . Drug abuse    hx of  . Dyspnea on exertion   . GERD (gastroesophageal reflux disease)   . Hiatal hernia   . Hyperlipidemia   . Hypertension   . Hypothyroidism   . Nontoxic multinodular goiter   . Pulmonary nodule --- CT 10/19/2014: Nodule is stable, no further routine x-rays 01/06/2009  . TIA (transient ischemic attack)   . Trigeminal neuralgia   . Unspecified asthma(493.90)      Social History   Social History  . Marital status: Married    Spouse name: N/A  . Number of children: 2  . Years of education: N/A   Occupational History  . Executive [employer]    Social  History Main Topics  . Smoking status: Current Some Day Smoker    Packs/day: 0.25    Years: 18.00    Types: Cigarettes  . Smokeless tobacco: Never Used     Comment:  occ smoker 1-2 cigs per week  . Alcohol use No  . Drug use: No  . Sexual activity: Not on file   Other Topics Concern  . Not on file   Social History Narrative   HSG   Culinary school in [place]   Married - '84 - 5 years divorced; remarried '96   1 son - '85                 Past Surgical History:  Procedure Laterality Date  . APPENDECTOMY    . bowel obstruction    . CARDIAC CATHETERIZATION N/A 11/25/2014   Procedure: Left Heart Cath and Coronary Angiography;  Surgeon: Belva Crome, MD;  Location: [facility];  Service: Cardiovascular;  Laterality: N/A;  . DESTRUCTION TRIGEMINAL NERVE VIA NEUROLYTIC AGENT  x 3 , R side last ~2010  . KNEE ARTHROSCOPY Right   . 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 Right Shoulder Redo-Reconstruction  06/21/10    Family History  Problem Relation Age of Onset  . Diabetes Mother   . Hyperlipidemia Mother   . Hypertension Mother   . Hypertension Father   . Heart disease Father        CAD/CHF  . Cancer Father        Mesothelioma  . Heart disease Maternal Aunt   . Prostate cancer Neg Hx   . Colon cancer Neg Hx     No Known Allergies  Current Outpatient Prescriptions on File Prior to Visit  Medication Sig Dispense Refill  . aspirin EC 81 MG EC tablet Take 1 tablet (81 mg total) by mouth daily. 30 tablet 3  . saccharomyces boulardii (FLORASTOR) 250 MG capsule Take 1 capsule (250 mg total) by mouth 2 (two) times daily. 60 capsule 0   Current Facility-Administered Medications on File Prior to Visit  Medication Dose Route Frequency Provider Last Rate Last Dose  . 0.9 %  sodium chloride infusion  500 mL Intravenous Continuous Ladene Artist, MD        BP (!) 167/102 (BP Location: Right Arm, Cuff Size: Large)   Pulse 82   Temp  98.4 F (36.9 C) (Oral)   Resp 18   Ht 5\' 11"  (1.803 m)   Wt 220 lb 12.8 oz (100.2 kg)   SpO2 99%   BMI 30.80 kg/m       Objective:   Physical Exam  Constitutional: He is oriented to person, place, and time. He appears well-developed and well-nourished. No distress.  HENT:  Head: Normocephalic and atraumatic.  Slightly firm, pea sized lesion (about one inch left of left side of lip) noted beneath skin with mild swelling surrounding lesion  Cardiovascular: Normal rate and regular rhythm.   No murmur heard. Pulmonary/Chest: Effort normal and breath sounds normal. No respiratory distress. He has no wheezes. He has no rales.  Musculoskeletal: He exhibits no edema.  Neurological: He is alert and oriented to person, place, and time.  Skin: Skin is warm and dry.  Psychiatric: He has a normal mood and affect. His behavior is normal. Thought content normal.          Assessment & Plan:  Skin lesion- ? Sebaceous cyst versus abscess.  Will rx with doxycycline, reassess in 1 week. If not improved, plan referral to dermatology.   HTN- uncontrolled. Resume BP meds. Obtain bmet.   Hyperlipidemia- resume statin, check follow up lipid panel.   Insomnia- trial of trazodone.  Gerd- stable on PPI, refill sent.

## 2016-09-15 ENCOUNTER — Encounter: Payer: Self-pay | Admitting: Internal Medicine

## 2016-09-15 ENCOUNTER — Ambulatory Visit (INDEPENDENT_AMBULATORY_CARE_PROVIDER_SITE_OTHER): Payer: BLUE CROSS/BLUE SHIELD | Admitting: Internal Medicine

## 2016-09-15 VITALS — BP 158/98 | HR 82 | Temp 98.0°F | Resp 14 | Ht 71.0 in | Wt 218.0 lb

## 2016-09-15 DIAGNOSIS — I10 Essential (primary) hypertension: Secondary | ICD-10-CM

## 2016-09-15 DIAGNOSIS — D2330 Other benign neoplasm of skin of unspecified part of face: Secondary | ICD-10-CM | POA: Diagnosis not present

## 2016-09-15 MED ORDER — LOSARTAN POTASSIUM 100 MG PO TABS: 100.0000 mg | ORAL_TABLET | Freq: Every day | ORAL | 3 refills | Status: DC

## 2016-09-15 NOTE — Patient Instructions (Signed)
Schedule labs to be done 2 weeks from today  Schedule a physical exam in 3 months from today  Increase losartan to 100 mg once daily.  Watch the salt intake   Check the  blood pressure weekly  Be sure your blood pressure is between 110/65 and  140/85.  if it is consistently higher or lower, let me know

## 2016-09-15 NOTE — Progress Notes (Signed)
Subjective:    Patient ID: [name], male    DOB: 16-Dec-1960, 56 y.o.   MRN: [id number]  DOS:  09/15/2016 Type of visit - description : f/u Interval history:  Was seen 10 days ago by Mrs Inda Castle, at the time, he was not taking his medication (x 2 weeks?),  was placed back on BP and cholesterol medication. Also had a infection in the face and is taking antibiotics. Report good meds patient compliance. I smell some alcohol on his breath and he states that he had a glass of wine earlier today.   Review of Systems Denies fever, chills. No chest pain or difficulty breathing. he continue smoking.  Past Medical History:  Diagnosis Date  . BPH (benign prostatic hyperplasia)   . Chest pain, atypical   . Cluster headache    hx of  . Complication of anesthesia    difficult waking up"  . Drug abuse    hx of  . Dyspnea on exertion   . GERD (gastroesophageal reflux disease)   . Hiatal hernia   . Hyperlipidemia   . Hypertension   . Hypothyroidism   . Nontoxic multinodular goiter   . Pulmonary nodule --- CT 10/19/2014: Nodule is stable, no further routine x-rays 01/06/2009  . TIA (transient ischemic attack)   . Trigeminal neuralgia   . Unspecified asthma(493.90)     Past Surgical History:  Procedure Laterality Date  . APPENDECTOMY    . bowel obstruction    . CARDIAC CATHETERIZATION N/A 11/25/2014   Procedure: Left Heart Cath and Coronary Angiography;  Surgeon: Belva Crome, MD;  Location: [facility];  Service: Cardiovascular;  Laterality: N/A;  . DESTRUCTION TRIGEMINAL NERVE VIA NEUROLYTIC AGENT  x 3 , R side last ~2010  . KNEE ARTHROSCOPY Right   . 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 Right Shoulder Redo-Reconstruction  06/21/10    Social History   Social History  . Marital status: Married    Spouse name: N/A  . Number of children: 2  . Years of education: N/A   Occupational History  . Executive Chef [employer]     Social History Main Topics  . Smoking status: Current Some Day Smoker    Packs/day: 0.25    Years: 18.00    Types: Cigarettes  . Smokeless tobacco: Never Used     Comment:  occ smoker 1-2 cigs per week  . Alcohol use Yes     Comment: rare   . Drug use: No  . Sexual activity: Not on file   Other Topics Concern  . Not on file   Social History Narrative   HSG   Culinary school in [place]   Married - '84 - 5 years divorced; remarried '96   1 son - '85                   Allergies as of 09/15/2016   No Known Allergies     Medication List       Accurate as of 09/15/16 11:59 PM. Always use your most recent med list.          aspirin 81 MG EC tablet Take 1 tablet (81 mg total) by mouth daily.   atorvastatin 20 MG tablet Commonly known as:  LIPITOR Take 1 tablet (20 mg total) by mouth at bedtime.   doxycycline 100 MG tablet Commonly known as:  VIBRA-TABS Take 1 tablet (100 mg total) by  mouth 2 (two) times daily.   lansoprazole 30 MG capsule Commonly known as:  PREVACID Take 1 capsule (30 mg total) by mouth daily at 12 noon.   losartan 100 MG tablet Commonly known as:  COZAAR Take 1 tablet (100 mg total) by mouth daily.   traZODone 50 MG tablet Commonly known as:  DESYREL Take 0.5-1 tablets (25-50 mg total) by mouth at bedtime as needed for sleep.          Objective:   Physical Exam  HENT:  Head:     BP (!) 158/98 (BP Location: Left Arm, Patient Position: Sitting, Cuff Size: Normal)   Pulse 82   Temp 98 F (36.7 C) (Oral)   Resp 14   Ht 5\' 11"  (1.803 m)   Wt 218 lb (98.9 kg)   SpO2 98%   BMI 30.40 kg/m  General:   Well developed, well nourished . NAD.  HEENT:  Normocephalic . Face symmetric, atraumatic Lungs:  CTA B Normal respiratory effort, no intercostal retractions, no accessory muscle use. Heart: RRR,  no murmur.  No pretibial edema bilaterally  Skin: Not pale. Not jaundice Neurologic:  alert & oriented X3.  Speech normal,  gait appropriate for age and unassisted Psych--  Cognition and judgment appear intact.  Cooperative with normal attention span and concentration.  Behavior appropriate. No anxious or depressed appearing.      Assessment & Plan:  Assessment Hyperglycemia, A1c 5.9 (2015) HTN Hyperlipidemia GERD Hypothyroidism Goiter -  9150 FNA FOLLICULAR LESION OF UNCERTAIN SIGNIFICANCE, and another site neg  2011 FNA (-) malignancy 09-2015 Bx (-) Leukocytosis, h/o thrombocytopenia - not seen by hematology as of 09-2015 CAD:  ---Cath  2010 nonobstructive CAD ---Cath 11-2014: Nonobstructive CAD, less than 50%, Rx aggressive medical therapy H/o TIA BPH Drug abuse Cluster headaches H/o Pulmonary nodule, CT 2016 stable, no further schedule CTS H/o Renal mass, incidental on CT chest 2016, [place] 2 benign cysts   PLAN Cyst  face: Not improving with antibiotic, refer to ENT for possible excision HTN: Has been taking BP meds consistently for 10 days, upon arrival BP was 162/90, BP rechecked 158/98. Increase losartan to 100 mg, BMP in 2 weeks. Watch salt intake. High cholesterol: Last LDL 176, unclear to me Lipitor compliance, recommend to stay on Lipitor, recheck on RTC. Counseled about tobacco and alcohol RTC 3 months, CPX.

## 2016-09-15 NOTE — Progress Notes (Signed)
Pre visit review using our clinic review tool, if applicable. No additional management support is needed unless otherwise documented below in the visit note. 

## 2016-09-17 NOTE — Assessment & Plan Note (Signed)
Cyst  face: Not improving with antibiotic, refer to ENT for possible excision HTN: Has been taking BP meds consistently for 10 days, upon arrival BP was 162/90, BP rechecked 158/98. Increase losartan to 100 mg, BMP in 2 weeks. Watch salt intake. High cholesterol: Last LDL 176, unclear to me Lipitor compliance, recommend to stay on Lipitor, recheck on RTC. Counseled about tobacco and alcohol RTC 3 months, CPX.

## 2016-09-19 NOTE — Telephone Encounter (Signed)
Please contact pt re: unread mychart message and remind him to keep his upcoming apt with Dr. [name] for cpx in August so he can recheck his cholesterol.

## 2016-09-19 NOTE — Telephone Encounter (Signed)
Pt was seen by PCP on 09/15/2016.

## 2016-09-29 ENCOUNTER — Other Ambulatory Visit (INDEPENDENT_AMBULATORY_CARE_PROVIDER_SITE_OTHER): Payer: BLUE CROSS/BLUE SHIELD

## 2016-09-29 DIAGNOSIS — I10 Essential (primary) hypertension: Secondary | ICD-10-CM

## 2016-09-29 LAB — BASIC METABOLIC PANEL
BUN: 22 mg/dL (ref 6–23)
CO2: 26 mEq/L (ref 19–32)
Calcium: 9.3 mg/dL (ref 8.4–10.5)
Chloride: 105 mEq/L (ref 96–112)
Creatinine, Ser: 1.01 mg/dL (ref 0.40–1.50)
GFR: 99.19 mL/min (ref >60.00)
Glucose, Bld: 116 mg/dL — ABNORMAL HIGH (ref 70–99)
Potassium: 3.8 mEq/L (ref 3.5–5.1)
Sodium: 139 mEq/L (ref 135–145)

## 2016-10-08 ENCOUNTER — Inpatient Hospital Stay (HOSPITAL_BASED_OUTPATIENT_CLINIC_OR_DEPARTMENT_OTHER)
Admission: EM | Admit: 2016-10-08 | Discharge: 2016-10-10 | DRG: 392 | Disposition: A | Discharge status: Home / Self Care | Payer: BLUE CROSS/BLUE SHIELD | Attending: Internal Medicine | Admitting: Internal Medicine

## 2016-10-08 ENCOUNTER — Encounter (HOSPITAL_BASED_OUTPATIENT_CLINIC_OR_DEPARTMENT_OTHER): Payer: Self-pay | Admitting: *Deleted

## 2016-10-08 ENCOUNTER — Emergency Department (HOSPITAL_BASED_OUTPATIENT_CLINIC_OR_DEPARTMENT_OTHER): Payer: BLUE CROSS/BLUE SHIELD

## 2016-10-08 DIAGNOSIS — R112 Nausea with vomiting, unspecified: Secondary | ICD-10-CM | POA: Diagnosis not present

## 2016-10-08 DIAGNOSIS — K219 Gastro-esophageal reflux disease without esophagitis: Secondary | ICD-10-CM | POA: Diagnosis present

## 2016-10-08 DIAGNOSIS — Z8249 Family history of ischemic heart disease and other diseases of the circulatory system: Secondary | ICD-10-CM

## 2016-10-08 DIAGNOSIS — K529 Noninfective gastroenteritis and colitis, unspecified: Principal | ICD-10-CM | POA: Diagnosis present

## 2016-10-08 DIAGNOSIS — Z833 Family history of diabetes mellitus: Secondary | ICD-10-CM

## 2016-10-08 DIAGNOSIS — I2510 Atherosclerotic heart disease of native coronary artery without angina pectoris: Secondary | ICD-10-CM | POA: Diagnosis present

## 2016-10-08 DIAGNOSIS — E039 Hypothyroidism, unspecified: Secondary | ICD-10-CM | POA: Diagnosis present

## 2016-10-08 DIAGNOSIS — I10 Essential (primary) hypertension: Secondary | ICD-10-CM | POA: Diagnosis present

## 2016-10-08 DIAGNOSIS — R197 Diarrhea, unspecified: Secondary | ICD-10-CM

## 2016-10-08 DIAGNOSIS — E785 Hyperlipidemia, unspecified: Secondary | ICD-10-CM | POA: Diagnosis present

## 2016-10-08 DIAGNOSIS — N2889 Other specified disorders of kidney and ureter: Secondary | ICD-10-CM | POA: Diagnosis present

## 2016-10-08 DIAGNOSIS — I248 Other forms of acute ischemic heart disease: Secondary | ICD-10-CM | POA: Diagnosis present

## 2016-10-08 LAB — COMPREHENSIVE METABOLIC PANEL
ALT: 24 U/L (ref 17–63)
AST: 33 U/L (ref 15–41)
Albumin: 4.4 g/dL (ref 3.5–5.0)
Alkaline Phosphatase: 47 U/L (ref 38–126)
Anion gap: 17 — ABNORMAL HIGH (ref 5–15)
BUN: 18 mg/dL (ref 6–20)
CO2: 20 mmol/L — ABNORMAL LOW (ref 22–32)
Calcium: 9.2 mg/dL (ref 8.9–10.3)
Chloride: 100 mmol/L — ABNORMAL LOW (ref 101–111)
Creatinine, Ser: 1.16 mg/dL (ref 0.61–1.24)
GFR calc Af Amer: >60 mL/min (ref >60)
GFR calc non Af Amer: >60 mL/min (ref >60)
Glucose, Bld: 103 mg/dL — ABNORMAL HIGH (ref 65–99)
Potassium: 3.7 mmol/L (ref 3.5–5.1)
Sodium: 137 mmol/L (ref 135–145)
Total Bilirubin: 1.4 mg/dL — ABNORMAL HIGH (ref 0.3–1.2)
Total Protein: 7.6 g/dL (ref 6.5–8.1)

## 2016-10-08 LAB — CBC
HCT: 45.1 % (ref 39.0–52.0)
Hemoglobin: 16.3 g/dL (ref 13.0–17.0)
MCH: 31.9 pg (ref 26.0–34.0)
MCHC: 36.1 g/dL — ABNORMAL HIGH (ref 30.0–36.0)
MCV: 88.3 fL (ref 78.0–100.0)
Platelets: 184 10*3/uL (ref 150–400)
RBC: 5.11 MIL/uL (ref 4.22–5.81)
RDW: 13.2 % (ref 11.5–15.5)
WBC: 11 10*3/uL — ABNORMAL HIGH (ref 4.0–10.5)

## 2016-10-08 LAB — URINALYSIS, ROUTINE W REFLEX MICROSCOPIC
Bilirubin Urine: NEGATIVE
Glucose, UA: NEGATIVE mg/dL
Hgb urine dipstick: NEGATIVE
Ketones, ur: >80 mg/dL — AB
Leukocytes, UA: NEGATIVE
Nitrite: NEGATIVE
Protein, ur: NEGATIVE mg/dL
Specific Gravity, Urine: 1.017 (ref 1.005–1.030)
pH: 6 (ref 5.0–8.0)

## 2016-10-08 LAB — LIPASE, BLOOD: Lipase: 28 U/L (ref 11–51)

## 2016-10-08 MED ORDER — METOCLOPRAMIDE HCL 5 MG/ML IJ SOLN
10.0000 mg | Freq: Once | INTRAMUSCULAR | Status: AC
  Administered 2016-10-08: 10 mg via INTRAVENOUS
  Filled 2016-10-08: qty 2

## 2016-10-08 MED ORDER — SODIUM CHLORIDE 0.9 % IV BOLUS (SEPSIS)
1000.0000 mL | Freq: Once | INTRAVENOUS | Status: AC
  Administered 2016-10-08: 1000 mL via INTRAVENOUS

## 2016-10-08 MED ORDER — IOPAMIDOL (ISOVUE-300) INJECTION 61%
100.0000 mL | Freq: Once | INTRAVENOUS | Status: AC | PRN
  Administered 2016-10-08: 100 mL via INTRAVENOUS

## 2016-10-08 MED ORDER — SODIUM CHLORIDE 0.9 % IV BOLUS (SEPSIS)
1000.0000 mL | Freq: Once | INTRAVENOUS | Status: AC
Start: 2016-10-08 — End: 2016-10-08
  Administered 2016-10-08: 1000 mL via INTRAVENOUS

## 2016-10-08 MED ORDER — ONDANSETRON HCL 4 MG/2ML IJ SOLN
4.0000 mg | Freq: Once | INTRAMUSCULAR | Status: AC | PRN
  Administered 2016-10-08: 4 mg via INTRAVENOUS
  Filled 2016-10-08: qty 2

## 2016-10-08 MED ORDER — GI COCKTAIL ~~LOC~~
30.0000 mL | Freq: Once | ORAL | Status: AC
  Administered 2016-10-08: 30 mL via ORAL
  Filled 2016-10-08: qty 30

## 2016-10-08 MED ORDER — MORPHINE SULFATE (PF) 4 MG/ML IV SOLN
4.0000 mg | Freq: Once | INTRAVENOUS | Status: AC
  Administered 2016-10-08: 4 mg via INTRAVENOUS
  Filled 2016-10-08: qty 1

## 2016-10-08 NOTE — ED Notes (Addendum)
Pt reports hx of N/V/D since last Wed. Hx admission for same "Ecoli" "Negative for C-diff". Does not remember urinating today. Presents with nausea and dry heaves at present. Took imodium which helped with diarrhea. Placed on monitor. EKG being done. IV initiated and labs drawn.

## 2016-10-08 NOTE — ED Notes (Signed)
ED Provider at bedside. 

## 2016-10-08 NOTE — ED Notes (Signed)
CT came to take pt for CT scan and stated that the pt's IV has came out. Pt states he is not sure how it came out. RN notified.

## 2016-10-08 NOTE — ED Notes (Signed)
Fluid challenge given.

## 2016-10-08 NOTE — ED Notes (Addendum)
IV and labs initiated and drawn by this RN

## 2016-10-08 NOTE — ED Provider Notes (Signed)
[name]   CSN: [id number] Arrival date & time: 10/08/16  1740   By signing my name below, I, [name], attest that this documentation has been prepared under the direction and in the presence of Calvary Difranco, Grayce Sessions, MD. Electronically signed, [name], ED Scribe. 10/08/16. 6:48 PM.   History   Chief Complaint Chief Complaint  Patient presents with  . Emesis   The history is provided by the patient and medical records. No language interpreter was used.    [name] is a 56 y.o. male with h/o GERD presenting to the Emergency Department with chief complaint of N/V/D x ~5 days. Pt states vomiting began first. He notes yellow vomit that has progressed to melena; dark brown diarrhea noted. He also states he has not urinated today. Moderate L lower abdominal cramps described; pt states his pain is fluctuating and worse with urges to relieve bowels. He states he has taken imodium without relief.  He states he finished a course of doxycycline 5-6 days ago, prescribed by his PCP on 09/15/2016 for an abscess. 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 No sick contacts noted with similar symptoms noted. Pt admitted to [facility] ED for E. Coli infection ~1 year ago; he states his treatment team ruled out C. Differens at this time. H/o colonoscopy noted. No h/o DM, diverticulitis or diverticulosis. No fever or dysuria noted. No recent long distance travel or suspicious food intake.  Past Medical History:  Diagnosis Date  . BPH (benign prostatic hyperplasia)   . Chest pain, atypical   . Cluster headache    hx of  . Complication of anesthesia    difficult waking up"  . Drug abuse    hx of  . Dyspnea on exertion   . GERD (gastroesophageal reflux disease)   . Hiatal hernia   . Hyperlipidemia   . Hypertension   . Hypothyroidism   . Nontoxic multinodular goiter   . Pulmonary nodule --- CT 10/19/2014: Nodule is stable, no further routine  x-rays 01/06/2009  . TIA (transient ischemic attack)   . Trigeminal neuralgia   . Unspecified asthma(493.90)     Patient Active Problem List   Diagnosis Date Noted  . Enteritis due to Clostridium difficile 10/30/2015  . Malnutrition of moderate degree 10/21/2015  . Acute kidney injury ([place]) 10/20/2015  . PCP NOTES >>>>>>>>>>>>>>>>> 09/30/2015  . HLD (hyperlipidemia) 09/22/2015  . Renal mass, left 11/02/2014  . CAD (coronary artery disease)   . Drug abuse and dependence ([place]) 09/18/2014  . Visit for preventive health examination 02/04/2014  . Screening for ischemic heart disease 02/04/2014  . BPH associated with nocturia 12/24/2013  . Hypothyroidism 12/24/2013  . Chronic ethmoidal sinusitis 12/24/2013  . Low back pain radiating to right leg 05/10/2012  . Thrombocytopenia ([place]) 12/05/2011  . Tobacco abuse 11/30/2011  . Anxiety 12/26/2010  . Transient cerebral ischemia 01/07/2010  . Asthma 01/19/2009  . Pulmonary nodule --- CT 10/19/2014: Nodule is stable, no further routine x-rays 01/06/2009  . Nontoxic multinodular goiter 12/31/2008  . DYSPNEA ON EXERTION 12/15/2008  . Trigeminal neuralgia 07/24/2007  . Dyslipidemia 12/27/2006  . Cluster headache 12/27/2006  . Essential hypertension 12/27/2006  . GERD 12/27/2006    Past Surgical History:  Procedure Laterality Date  . APPENDECTOMY    . bowel obstruction    . CARDIAC CATHETERIZATION N/A 11/25/2014   Procedure: Left Heart Cath and Coronary Angiography;  Surgeon: Belva Crome, MD;  Location: [facility];  Service: Cardiovascular;  Laterality: N/A;  . DESTRUCTION TRIGEMINAL NERVE VIA NEUROLYTIC AGENT  x 3 , R side last ~2010  . KNEE ARTHROSCOPY Right   . 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 Right Shoulder Redo-Reconstruction  06/21/10       Home Medications    Prior to Admission medications   Medication Sig Start Date End Date Taking? Authorizing Provider  atorvastatin (LIPITOR)  20 MG tablet Take 1 tablet (20 mg total) by mouth at bedtime. 09/05/16   Debbrah Alar, NP  lansoprazole (PREVACID) 30 MG capsule Take 1 capsule (30 mg total) by mouth daily at 12 noon. 09/05/16   Debbrah Alar, NP  losartan (COZAAR) 100 MG tablet Take 1 tablet (100 mg total) by mouth daily. 09/15/16   Colon Branch, MD  traZODone (DESYREL) 50 MG tablet Take 0.5-1 tablets (25-50 mg total) by mouth at bedtime as needed for sleep. 09/05/16   Debbrah Alar, NP    Family History Family History  Problem Relation Age of Onset  . Diabetes Mother   . Hyperlipidemia Mother   . Hypertension Mother   . Hypertension Father   . Heart disease Father        CAD/CHF  . Cancer Father        Mesothelioma  . Heart disease Maternal Aunt   . Prostate cancer Neg Hx   . Colon cancer Neg Hx     Social History Social History  Substance Use Topics  . Smoking status: Current Some Day Smoker    Packs/day: 0.25    Years: 18.00    Types: Cigarettes  . Smokeless tobacco: Never Used     Comment:  occ smoker 1-2 cigs per week  . Alcohol use Yes     Comment: rare      Allergies   Patient has no known allergies.   Review of Systems Review of Systems All other systems reviewed and all systems are negative for acute changes except as noted in the HPI and PMH.    Physical Exam Updated Vital Signs BP (!) 165/111 (BP Location: Left Arm)   Pulse 99   Temp 99.2 F (37.3 C) (Axillary) Comment (Src): unable to obtain oral temp due to pt being nauseated  Resp (!) 24   Ht 5' 10.5" (1.791 m)   Wt 220 lb (99.8 kg)   SpO2 98%   BMI 31.12 kg/m   Physical Exam  Constitutional: He is oriented to person, place, and time. He appears well-developed and well-nourished. He has a sickly appearance. No distress.  HENT:  Head: Normocephalic and atraumatic.  Nose: Nose normal.  Eyes: Conjunctivae and EOM are normal. Pupils are equal, round, and reactive to light. Right eye exhibits no discharge. Left  eye exhibits no discharge. No scleral icterus.  Neck: Normal range of motion. Neck supple.  Cardiovascular: Normal rate and regular rhythm.  Exam reveals no gallop and no friction rub.   No murmur heard. Pulmonary/Chest: Effort normal and breath sounds normal. No stridor. No respiratory distress. He has no rales.  Abdominal: Soft. He exhibits no distension. There is tenderness (TTP) in the left lower quadrant.  Musculoskeletal: He exhibits no edema or tenderness.  Neurological: He is alert and oriented to person, place, and time.  Skin: Skin is warm and dry. No rash noted. He is not diaphoretic. No erythema.  Psychiatric: He has a normal mood and affect.  Vitals reviewed.    ED  Treatments / Results  DIAGNOSTIC STUDIES: Oxygen Saturation is 98% on RA, NL by my interpretation.    COORDINATION OF CARE: 6:29 PM-Discussed next steps with pt. Pt verbalized understanding and is agreeable with the plan. Will order pain medication and labs.   Labs (all labs ordered are listed, but only abnormal results are displayed) Labs Reviewed  COMPREHENSIVE METABOLIC PANEL - Abnormal; Notable for the following:       Result Value   Chloride 100 (*)    CO2 20 (*)    Glucose, Bld 103 (*)    Total Bilirubin 1.4 (*)    Anion gap 17 (*)    All other components within normal limits  CBC - Abnormal; Notable for the following:    WBC 11.0 (*)    MCHC 36.1 (*)    All other components within normal limits  URINALYSIS, ROUTINE W REFLEX MICROSCOPIC - Abnormal; Notable for the following:    Ketones, ur >80 (*)    All other components within normal limits  C DIFFICILE QUICK SCREEN W PCR REFLEX  GASTROINTESTINAL PANEL BY PCR, STOOL (REPLACES STOOL CULTURE)  LIPASE, BLOOD    EKG  EKG Interpretation None       Radiology Ct Abdomen Pelvis W Contrast  Result Date: 10/08/2016 CLINICAL DATA:  Abdominal pain, nausea, vomiting, diarrhea for several days. Prior appendectomy. EXAM: CT ABDOMEN AND PELVIS  WITH CONTRAST TECHNIQUE: Multidetector CT imaging of the abdomen and pelvis was performed using the standard protocol following bolus administration of intravenous contrast. CONTRAST:  130mL ISOVUE-300 IOPAMIDOL (ISOVUE-300) INJECTION 61% COMPARISON:  10/29/2015 CT abdomen/pelvis. FINDINGS: Lower chest: No significant pulmonary nodules or acute consolidative airspace disease. Hepatobiliary: Diffuse hepatic steatosis. Normal liver size. No definite liver surface irregularity. No liver mass. Normal gallbladder with no radiopaque cholelithiasis. No biliary ductal dilatation. Pancreas: Normal, with no mass or duct dilation. Spleen: Normal size. No mass. Adrenals/Urinary Tract: Normal adrenals. No hydronephrosis. Indeterminate 1.9 x 1.6 cm renal cortical mass in the lateral interpolar left kidney (series 2/ image 29) with density 63 HU, slightly increased from 1.6 x 1.3 cm on 10/29/2015. Exophytic simple 2.4 cm renal cyst in the anterior upper left kidney. Additional subcentimeter hypodense renal cortical lesions in the left kidney, too small to characterize, not appreciably changed, for which no further follow-up is required. No significant bladder distention or definite bladder wall thickening. An ovoid 8 mm calcification at the posterior left bladder wall margin is stable and may represent a calcified venous phlebolith abutting the bladder wall. Stomach/Bowel: Grossly normal stomach. Normal caliber small bowel with no small bowel wall thickening. Normal large bowel with no diverticulosis, large bowel wall thickening or pericolonic fat stranding. Vascular/Lymphatic: Atherosclerotic nonaneurysmal abdominal aorta. Patent portal, splenic, hepatic and renal veins. No pathologically enlarged lymph nodes in the abdomen or pelvis. Reproductive: Top-normal size prostate. Other: No pneumoperitoneum, ascites or focal fluid collection. Stable small fat containing umbilical hernia. Musculoskeletal: No aggressive appearing focal  osseous lesions. Mild thoracolumbar spondylosis. IMPRESSION: 1. No acute abnormality. No evidence of bowel obstruction or acute bowel inflammation. 2. Indeterminate 1.9 cm renal cortical mass in the lateral interpolar left kidney, mildly increased in size since 10/29/2015 CT abdomen/pelvis study. Recommend further evaluation with MRI abdomen without and with IV contrast on a short term outpatient basis. Renal cell carcinoma not excluded. 3. Diffuse hepatic steatosis. 4. Aortic atherosclerosis. Electronically Signed   By: Ilona Sorrel M.D.   On: 10/08/2016 21:20    Procedures Procedures (including critical care time)  Medications  Ordered in ED Medications  sodium chloride 0.9 % bolus 1,000 mL (not administered)  ondansetron (ZOFRAN) injection 4 mg (4 mg Intravenous Given 10/08/16 1832)  morphine 4 MG/ML injection 4 mg (4 mg Intravenous Given 10/08/16 1843)  sodium chloride 0.9 % bolus 1,000 mL (0 mLs Intravenous Stopped 10/08/16 1854)  sodium chloride 0.9 % bolus 1,000 mL (1,000 mLs Intravenous New Bag/Given 10/08/16 1854)  iopamidol (ISOVUE-300) 61 % injection 100 mL (100 mLs Intravenous Contrast Given 10/08/16 2004)  ondansetron (ZOFRAN) injection 4 mg (4 mg Intravenous Given 10/08/16 2041)  gi cocktail (Maalox,Lidocaine,Donnatal) (30 mLs Oral Given 10/08/16 2331)  metoCLOPramide (REGLAN) injection 10 mg (10 mg Intravenous Given 10/08/16 2306)     Initial Impression / Assessment and Plan / ED Course  I have reviewed the triage vital signs and the nursing notes.  Pertinent labs & imaging results that were available during my care of the patient were reviewed by me and considered in my medical decision making (see chart for details).     Workup was consistent with likely viral gastroenteritis. Given his left lower quadrant abdominal tenderness and recent antibiotic use, CT scan was obtained which did not reveal evidence of colitis concerning for C. difficile infection. However a GI panel and C.  difficile studies were ordered. Patient has shifted to provide [place] with a sample stool at this time.  Other labs were grossly reassuring.  Patient provided with several doses of nausea medication; however patient still continued to have emesis. He'll require admission for IV hydration given his intractable nausea and vomiting.  Discussed the case with Dr. Myna Hidalgo, who will admit.  Final Clinical Impressions(s) / ED Diagnoses   Final diagnoses:  Nausea vomiting and diarrhea  Intractable vomiting with nausea, unspecified vomiting type   I personally performed the services described in this documentation, which was scribed in my presence. The recorded information has been reviewed and is accurate.        Fatima Blank, MD 10/09/16 [phone]

## 2016-10-08 NOTE — ED Notes (Signed)
Pt vomited following administration of GI cocktail. EDP notified.

## 2016-10-08 NOTE — ED Triage Notes (Signed)
Pt reports N/V/D x 4 days.  States that he drove himself here today.  Intermittent abdominal cramping.

## 2016-10-09 ENCOUNTER — Observation Stay (HOSPITAL_COMMUNITY): Payer: BLUE CROSS/BLUE SHIELD

## 2016-10-09 ENCOUNTER — Encounter (HOSPITAL_COMMUNITY): Payer: Self-pay | Admitting: Internal Medicine

## 2016-10-09 DIAGNOSIS — E785 Hyperlipidemia, unspecified: Secondary | ICD-10-CM | POA: Diagnosis present

## 2016-10-09 DIAGNOSIS — N2889 Other specified disorders of kidney and ureter: Secondary | ICD-10-CM | POA: Diagnosis not present

## 2016-10-09 DIAGNOSIS — K219 Gastro-esophageal reflux disease without esophagitis: Secondary | ICD-10-CM | POA: Diagnosis present

## 2016-10-09 DIAGNOSIS — Z833 Family history of diabetes mellitus: Secondary | ICD-10-CM | POA: Diagnosis not present

## 2016-10-09 DIAGNOSIS — I2510 Atherosclerotic heart disease of native coronary artery without angina pectoris: Secondary | ICD-10-CM | POA: Diagnosis present

## 2016-10-09 DIAGNOSIS — R112 Nausea with vomiting, unspecified: Secondary | ICD-10-CM | POA: Diagnosis present

## 2016-10-09 DIAGNOSIS — I10 Essential (primary) hypertension: Secondary | ICD-10-CM

## 2016-10-09 DIAGNOSIS — Z8249 Family history of ischemic heart disease and other diseases of the circulatory system: Secondary | ICD-10-CM | POA: Diagnosis not present

## 2016-10-09 DIAGNOSIS — E039 Hypothyroidism, unspecified: Secondary | ICD-10-CM | POA: Diagnosis present

## 2016-10-09 DIAGNOSIS — R197 Diarrhea, unspecified: Secondary | ICD-10-CM | POA: Diagnosis not present

## 2016-10-09 DIAGNOSIS — R748 Abnormal levels of other serum enzymes: Secondary | ICD-10-CM | POA: Diagnosis not present

## 2016-10-09 DIAGNOSIS — I248 Other forms of acute ischemic heart disease: Secondary | ICD-10-CM | POA: Diagnosis present

## 2016-10-09 DIAGNOSIS — K529 Noninfective gastroenteritis and colitis, unspecified: Secondary | ICD-10-CM | POA: Diagnosis present

## 2016-10-09 LAB — HEPATIC FUNCTION PANEL
ALT: 24 U/L (ref 17–63)
AST: 32 U/L (ref 15–41)
Albumin: 4.1 g/dL (ref 3.5–5.0)
Alkaline Phosphatase: 45 U/L (ref 38–126)
Bilirubin, Direct: 0.2 mg/dL (ref 0.1–0.5)
Indirect Bilirubin: 1.7 mg/dL — ABNORMAL HIGH (ref 0.3–0.9)
Total Bilirubin: 1.9 mg/dL — ABNORMAL HIGH (ref 0.3–1.2)
Total Protein: 7.3 g/dL (ref 6.5–8.1)

## 2016-10-09 LAB — CBC WITH DIFFERENTIAL/PLATELET
Basophils Absolute: 0 10*3/uL (ref 0.0–0.1)
Basophils Relative: 0 %
Eosinophils Absolute: 0 10*3/uL (ref 0.0–0.7)
Eosinophils Relative: 0 %
HCT: 44.1 % (ref 39.0–52.0)
Hemoglobin: 15.5 g/dL (ref 13.0–17.0)
Lymphocytes Relative: 45 %
Lymphs Abs: 4.1 10*3/uL — ABNORMAL HIGH (ref 0.7–4.0)
MCH: 31.8 pg (ref 26.0–34.0)
MCHC: 35.1 g/dL (ref 30.0–36.0)
MCV: 90.4 fL (ref 78.0–100.0)
Monocytes Absolute: 1.2 10*3/uL — ABNORMAL HIGH (ref 0.1–1.0)
Monocytes Relative: 14 %
Neutro Abs: 3.7 10*3/uL (ref 1.7–7.7)
Neutrophils Relative %: 41 %
Platelets: 165 10*3/uL (ref 150–400)
RBC: 4.88 MIL/uL (ref 4.22–5.81)
RDW: 13.4 % (ref 11.5–15.5)
WBC: 9.1 10*3/uL (ref 4.0–10.5)

## 2016-10-09 LAB — MAGNESIUM: Magnesium: 2.1 mg/dL (ref 1.7–2.4)

## 2016-10-09 LAB — RAPID URINE DRUG SCREEN, HOSP PERFORMED
Amphetamines: NOT DETECTED
Barbiturates: NOT DETECTED
Benzodiazepines: NOT DETECTED
Cocaine: NOT DETECTED
Opiates: POSITIVE — AB
Tetrahydrocannabinol: NOT DETECTED

## 2016-10-09 LAB — BASIC METABOLIC PANEL
Anion gap: 10 (ref 5–15)
BUN: 13 mg/dL (ref 6–20)
CO2: 26 mmol/L (ref 22–32)
Calcium: 8.7 mg/dL — ABNORMAL LOW (ref 8.9–10.3)
Chloride: 103 mmol/L (ref 101–111)
Creatinine, Ser: 1.13 mg/dL (ref 0.61–1.24)
GFR calc Af Amer: >60 mL/min (ref >60)
GFR calc non Af Amer: >60 mL/min (ref >60)
Glucose, Bld: 95 mg/dL (ref 65–99)
Potassium: 3.6 mmol/L (ref 3.5–5.1)
Sodium: 139 mmol/L (ref 135–145)

## 2016-10-09 LAB — GASTROINTESTINAL PANEL BY PCR, STOOL (REPLACES STOOL CULTURE)

## 2016-10-09 LAB — HIV ANTIBODY (ROUTINE TESTING W REFLEX): HIV Screen 4th Generation wRfx: NONREACTIVE

## 2016-10-09 LAB — TROPONIN I
Troponin I: 0.09 ng/mL (ref <0.03)
Troponin I: 0.06 ng/mL (ref <0.03)
Troponin I: 0.05 ng/mL (ref <0.03)

## 2016-10-09 LAB — C DIFFICILE QUICK SCREEN W PCR REFLEX
C Diff antigen: NEGATIVE
C Diff interpretation: NOT DETECTED
C Diff toxin: NEGATIVE

## 2016-10-09 MED ORDER — ATORVASTATIN CALCIUM 20 MG PO TABS
20.0000 mg | ORAL_TABLET | Freq: Every day | ORAL | Status: DC
  Administered 2016-10-09: 20 mg via ORAL
  Filled 2016-10-09: qty 1

## 2016-10-09 MED ORDER — DICYCLOMINE HCL 10 MG/ML IM SOLN
20.0000 mg | Freq: Once | INTRAMUSCULAR | Status: AC
  Administered 2016-10-09: 20 mg via INTRAMUSCULAR
  Filled 2016-10-09: qty 2

## 2016-10-09 MED ORDER — ACETAMINOPHEN 325 MG PO TABS: 650.0000 mg | ORAL_TABLET | Freq: Four times a day (QID) | ORAL | Status: DC | PRN

## 2016-10-09 MED ORDER — ORAL CARE MOUTH RINSE
15.0000 mL | Freq: Two times a day (BID) | OROMUCOSAL | Status: DC
  Administered 2016-10-10: 15 mL via OROMUCOSAL

## 2016-10-09 MED ORDER — TRAZODONE HCL 50 MG PO TABS: 25.0000 mg | ORAL_TABLET | Freq: Every evening | ORAL | Status: DC | PRN

## 2016-10-09 MED ORDER — ONDANSETRON HCL 4 MG/2ML IJ SOLN
4.0000 mg | Freq: Four times a day (QID) | INTRAMUSCULAR | Status: DC | PRN
  Administered 2016-10-09 – 2016-10-10 (×4): 4 mg via INTRAVENOUS
  Filled 2016-10-09 (×4): qty 2

## 2016-10-09 MED ORDER — ACETAMINOPHEN 650 MG RE SUPP: 650.0000 mg | Freq: Four times a day (QID) | RECTAL | Status: DC | PRN

## 2016-10-09 MED ORDER — HYDRALAZINE HCL 20 MG/ML IJ SOLN: 10.0000 mg | INTRAMUSCULAR | Status: DC | PRN

## 2016-10-09 MED ORDER — GADOBENATE DIMEGLUMINE 529 MG/ML IV SOLN
20.0000 mL | Freq: Once | INTRAVENOUS | Status: AC | PRN
  Administered 2016-10-09: 20 mL via INTRAVENOUS

## 2016-10-09 MED ORDER — POTASSIUM CHLORIDE CRYS ER 20 MEQ PO TBCR
40.0000 meq | EXTENDED_RELEASE_TABLET | Freq: Once | ORAL | Status: AC
Start: 2016-10-09 — End: 2016-10-09
  Administered 2016-10-09: 40 meq via ORAL
  Filled 2016-10-09 (×2): qty 1
  Filled 2016-10-09: qty 2

## 2016-10-09 MED ORDER — ALUM & MAG HYDROXIDE-SIMETH 200-200-20 MG/5ML PO SUSP
30.0000 mL | Freq: Once | ORAL | Status: AC
  Administered 2016-10-09: 30 mL via ORAL
  Filled 2016-10-09: qty 30

## 2016-10-09 MED ORDER — LORAZEPAM 2 MG/ML IJ SOLN
1.0000 mg | Freq: Once | INTRAMUSCULAR | Status: AC
  Administered 2016-10-09: 1 mg via INTRAVENOUS
  Filled 2016-10-09: qty 1

## 2016-10-09 MED ORDER — PANTOPRAZOLE SODIUM 40 MG IV SOLR
40.0000 mg | INTRAVENOUS | Status: DC
  Administered 2016-10-09 – 2016-10-10 (×2): 40 mg via INTRAVENOUS
  Filled 2016-10-09 (×3): qty 40

## 2016-10-09 MED ORDER — ONDANSETRON HCL 4 MG/2ML IJ SOLN: 4.0000 mg | Freq: Four times a day (QID) | INTRAMUSCULAR | Status: DC | PRN

## 2016-10-09 MED ORDER — BOOST / RESOURCE BREEZE PO LIQD: 1.0000 | Freq: Three times a day (TID) | ORAL | Status: DC

## 2016-10-09 MED ORDER — ENOXAPARIN SODIUM 40 MG/0.4ML ~~LOC~~ SOLN
40.0000 mg | SUBCUTANEOUS | Status: DC
  Administered 2016-10-09 – 2016-10-10 (×2): 40 mg via SUBCUTANEOUS
  Filled 2016-10-09 (×2): qty 0.4

## 2016-10-09 MED ORDER — MORPHINE SULFATE (PF) 2 MG/ML IV SOLN
1.0000 mg | INTRAVENOUS | Status: DC | PRN
  Administered 2016-10-09 – 2016-10-10 (×7): 1 mg via INTRAVENOUS
  Filled 2016-10-09 (×7): qty 1

## 2016-10-09 MED ORDER — SODIUM CHLORIDE 0.9 % IV SOLN
INTRAVENOUS | Status: AC
  Administered 2016-10-09: 05:00:00 via INTRAVENOUS
  Administered 2016-10-09: 1000 mL via INTRAVENOUS
  Administered 2016-10-09: 100 mL/h via INTRAVENOUS

## 2016-10-09 MED ORDER — SODIUM CHLORIDE 0.9 % IV BOLUS (SEPSIS)
1000.0000 mL | Freq: Once | INTRAVENOUS | Status: AC
  Administered 2016-10-09: 1000 mL via INTRAVENOUS

## 2016-10-09 MED ORDER — PROMETHAZINE HCL 25 MG/ML IJ SOLN
12.5000 mg | Freq: Four times a day (QID) | INTRAMUSCULAR | Status: DC | PRN
  Administered 2016-10-09 (×4): 12.5 mg via INTRAVENOUS
  Filled 2016-10-09 (×5): qty 1

## 2016-10-09 NOTE — Progress Notes (Signed)
Initial Nutrition Assessment  DOCUMENTATION CODES:   Obesity unspecified  INTERVENTION:   D/c Boost Breeze Diet advancement per MD RD will continue to monitor for pt's tolerance of diet  NUTRITION DIAGNOSIS:   Inadequate oral intake related to nausea, vomiting as evidenced by per patient/family report.  GOAL:   Patient will meet greater than or equal to 90% of their needs  MONITOR:   PO intake, Supplement acceptance, Labs, Weight trends, I & O's  REASON FOR ASSESSMENT:   Malnutrition Screening Tool    ASSESSMENT:   56 y.o. male with history of hypertension hyperlipidemia was recently on antibiotics for tooth infection started developing nausea vomiting and diarrhea over the last 4 days. 2 days after finishing his oral antibiotic course. Patient also has been having crampy abdominal pain mostly in the left flank area. Patient had taken some loperamide following which his diarrhea stopped yesterday but had persistent vomiting.   Patient in room with no family at bedside. Pt reports continued nausea. Pt was unable to eat anything this morning and vomited up his medications. Pt has has had N/V since Wednesday 6/13, and it has worsened to where he cannot tolerate anything PO. Pt has been ordered [employer], pt states he cannot tolerate these supplements. Will d/c at this time and will order a different supplement once pt able to tolerate basic liquids.   Per patient, UBW is 220 lb. No weight loss recorded. Will monitor trends. Nutrition focused physical exam shows no sign of depletion of muscle mass or body fat.  Labs reviewed. Medications: Iv Protonix daily, IV Zofran PRN, IV Phenergan PRN  Diet Order:  Diet clear liquid Room service appropriate? Yes; Fluid consistency: Thin  Skin:  Reviewed, no issues  Last BM:  6/17  Height:   Ht Readings from Last 1 Encounters:  10/08/16 5' 10.5" (1.791 m)    Weight:   Wt Readings from Last 1 Encounters:  10/08/16 220 lb (99.8  kg)    Ideal Body Weight:  78.2 kg  BMI:  Body mass index is 31.12 kg/m.  Estimated Nutritional Needs:   Kcal:  2000-2200  Protein:  80-90g  Fluid:  2L/day  EDUCATION NEEDS:   No education needs identified at this time   Clayton Bibles, MS, RD, LDN Pager: [phone] After Hours Pager: [phone]

## 2016-10-09 NOTE — Progress Notes (Signed)
Received critical value for tronpon. Dr Ree Kida notified. Orders noted

## 2016-10-09 NOTE — Progress Notes (Signed)
  PROGRESS NOTE    [name]  LSL:[id number] DOB: 1960/06/04 DOA: 10/08/2016 PCP: Colon Branch, MD   Chief Complaint  Patient presents with  . Emesis    Brief Narrative:  HPI on 10/09/2016 by Dr. Gean Birchwood Callin [name] is a 56 y.o. male with history of hypertension hyperlipidemia was recently on antibiotics for tooth infection started developing nausea vomiting and diarrhea over the last 4 days. 2 days after finishing his oral antibiotic course. Patient also has been having crampy abdominal pain mostly in the left flank area. Patient had taken some loperamide following which his diarrhea stopped yesterday but had persistent vomiting.  Assessment & Plan  Patient admitted earlier today. See H&P for details.  Intractable nausea vomiting, diarrhea -Possible gastroenteritis  -CT abdomen and pelvis showed no acute abnormality, no evidence of bowel obstruction or acute bowel inflammation -C. Difficile and GI pathogen panel negative -Patient has been seen by Dr. Fuller Plan, gastroenterology in the past. -Continue conservative management, antiemetics and IV fluids  Left renal lesion -CT abdomen and pelvis showed indeterminate 1.9303 no cortical mass in the lateral interpolar left kidney -MRI abdomen without and with IV contrast ordered  Uncontrolled hypertension -Patient on Cozaar at home -Continue IV hydralazine   Hyperlipidemia -Continue statin  History of nonobstructive coronary artery disease/ mildly elevated troponin -Cardiac cath in 2016 -Troponin 0.09 -Suspect elevated troponin likely due to demand ischemia from intractable nausea and vomiting -Continue to cycle -Patient currently chest pain-free -Continue statin  GERD -Will order a PPI IV given constant nausea and vomiting  DVT Prophylaxis  Lovenox  Code Status: Full  Family Communication: None at bedside  Disposition Plan: Home when stable, currently in observation  Consultants None  Procedures   None   LOS: 0 days   Time Spent in minutes   35 minutes  Mariaclara Spear D.O. on 10/09/2016 at 2:39 PM  Between 7am to 7pm - Pager - [phone]  After 7pm go to [email or web address] - password [email or web address]  And look for the night coverage person covering for me after hours  [facility] Office  [phone]

## 2016-10-09 NOTE — H&P (Signed)
History and Physical    [name] NTI:[id number] DOB: 06/06/1960 DOA: 10/08/2016  PCP: Colon Branch, MD  Patient coming from: Home.  Chief Complaint: Nausea vomiting and diarrhea.  HPI: [name] is a 56 y.o. male with history of hypertension hyperlipidemia was recently on antibiotics for tooth infection started developing nausea vomiting and diarrhea over the last 4 days. 2 days after finishing his oral antibiotic course. Patient also has been having crampy abdominal pain mostly in the left flank area. Patient had taken some loperamide following which his diarrhea stopped yesterday but had persistent vomiting.   ED Course: In the ER CT of the abdomen and pelvis does not show anything acute except for a left renal lesion which needs further study. UA shows ketosis. Patient is being admitted for further hydration and monitoring.  Review of Systems: As per HPI, rest all negative.   Past Medical History:  Diagnosis Date  . BPH (benign prostatic hyperplasia)   . Chest pain, atypical   . Cluster headache    hx of  . Complication of anesthesia    difficult waking up"  . Drug abuse    hx of  . Dyspnea on exertion   . GERD (gastroesophageal reflux disease)   . Hiatal hernia   . Hyperlipidemia   . Hypertension   . Hypothyroidism   . Nontoxic multinodular goiter   . Pulmonary nodule --- CT 10/19/2014: Nodule is stable, no further routine x-rays 01/06/2009  . TIA (transient ischemic attack)   . Trigeminal neuralgia   . Unspecified asthma(493.90)     Past Surgical History:  Procedure Laterality Date  . APPENDECTOMY    . bowel obstruction    . CARDIAC CATHETERIZATION N/A 11/25/2014   Procedure: Left Heart Cath and Coronary Angiography;  Surgeon: Belva Crome, MD;  Location: [facility];  Service: Cardiovascular;  Laterality: N/A;  . DESTRUCTION TRIGEMINAL NERVE VIA NEUROLYTIC AGENT  x 3 , R side last ~2010  . KNEE ARTHROSCOPY Right   . 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 Right Shoulder Redo-Reconstruction  06/21/10     reports that he has been smoking Cigarettes.  He has a 4.50 pack-year smoking history. He has never used smokeless tobacco. He reports that he drinks alcohol. He reports that he does not use drugs.  No Known Allergies  Family History  Problem Relation Age of Onset  . Diabetes Mother   . Hyperlipidemia Mother   . Hypertension Mother   . Hypertension Father   . Heart disease Father        CAD/CHF  . Cancer Father        Mesothelioma  . Heart disease Maternal Aunt   . Prostate cancer Neg Hx   . Colon cancer Neg Hx     Prior to Admission medications   Medication Sig Start Date End Date Taking? Authorizing Provider  atorvastatin (LIPITOR) 20 MG tablet Take 1 tablet (20 mg total) by mouth at bedtime. 09/05/16  Yes Debbrah Alar, NP  Fructose-Dextrose-Phosphor Acd (NAUSETROL PO) Take 1 tablet by mouth daily as needed.   Yes [provider]  lansoprazole (PREVACID) 30 MG capsule Take 1 capsule (30 mg total) by mouth daily at 12 noon. 09/05/16  Yes Debbrah Alar, NP  loperamide (IMODIUM A-D) 2 MG tablet Take 4 mg by mouth 4 (four) times daily as needed for diarrhea or loose stools.   Yes [provider]  losartan (COZAAR) 100  MG tablet Take 1 tablet (100 mg total) by mouth daily. 09/15/16  Yes Colon Branch, MD  traZODone (DESYREL) 50 MG tablet Take 0.5-1 tablets (25-50 mg total) by mouth at bedtime as needed for sleep. 09/05/16  Yes Debbrah Alar, NP    Physical Exam: Vitals:   10/09/16 0000 10/09/16 0030 10/09/16 0100 10/09/16 0141  BP: (!) 181/101 (!) 160/98 (!) 170/95 (!) 176/100  Pulse: 73 64 78 63  Resp: 18 (!) 22 (!) 22 20  Temp:    98.3 F (36.8 C)  TempSrc:    Oral  SpO2: 99% 98% 97% 100%  Weight:      Height:          Constitutional: Moderately built and nourished. Vitals:   10/09/16 0000 10/09/16 0030 10/09/16 0100 10/09/16 0141  BP: (!)  181/101 (!) 160/98 (!) 170/95 (!) 176/100  Pulse: 73 64 78 63  Resp: 18 (!) 22 (!) 22 20  Temp:    98.3 F (36.8 C)  TempSrc:    Oral  SpO2: 99% 98% 97% 100%  Weight:      Height:       Eyes: Anicteric no pallor. ENMT: No discharge from the ears eyes nose and mouth. Neck: No mass felt. No neck rigidity. Respiratory: No rhonchi or crepitations. Cardiovascular: S1-S2 heard no murmurs appreciated. Abdomen: Left flank tenderness no guarding or rigidity. Musculoskeletal: No edema. No effusion. Skin: No rash. Skin appears warm. Neurologic: Alert awake oriented to time place and person. Moves all extremities. Psychiatric: Appears normal. Normal affect.   Labs on Admission: I have personally reviewed following labs and imaging studies  CBC:  Recent Labs Lab 10/08/16 1816  WBC 11.0*  HGB 16.3  HCT 45.1  MCV 88.3  PLT 009   Basic Metabolic Panel:  Recent Labs Lab 10/08/16 1816  NA 137  K 3.7  CL 100*  CO2 20*  GLUCOSE 103*  BUN 18  CREATININE 1.16  CALCIUM 9.2   GFR: Estimated Creatinine Clearance: 87.9 mL/min (by C-G formula based on SCr of 1.16 mg/dL). Liver Function Tests:  Recent Labs Lab 10/08/16 1816  AST 33  ALT 24  ALKPHOS 47  BILITOT 1.4*  PROT 7.6  ALBUMIN 4.4    Recent Labs Lab 10/08/16 1816  LIPASE 28   No results for input(s): AMMONIA in the last 168 hours. Coagulation Profile: No results for input(s): INR, PROTIME in the last 168 hours. Cardiac Enzymes: No results for input(s): CKTOTAL, CKMB, CKMBINDEX, TROPONINI in the last 168 hours. BNP (last 3 results) No results for input(s): PROBNP in the last 8760 hours. HbA1C: No results for input(s): HGBA1C in the last 72 hours. CBG: No results for input(s): GLUCAP in the last 168 hours. Lipid Profile: No results for input(s): CHOL, HDL, LDLCALC, TRIG, CHOLHDL, LDLDIRECT in the last 72 hours. Thyroid Function Tests: No results for input(s): TSH, T4TOTAL, FREET4, T3FREE, THYROIDAB in the  last 72 hours. Anemia Panel: No results for input(s): VITAMINB12, FOLATE, FERRITIN, TIBC, IRON, RETICCTPCT in the last 72 hours. Urine analysis:    Component Value Date/Time   COLORURINE YELLOW 10/08/2016 1905   APPEARANCEUR CLEAR 10/08/2016 1905   LABSPEC 1.017 10/08/2016 1905   PHURINE 6.0 10/08/2016 1905   GLUCOSEU NEGATIVE 10/08/2016 1905   GLUCOSEU NEGATIVE 02/04/2014 0830   HGBUR NEGATIVE 10/08/2016 1905   BILIRUBINUR NEGATIVE 10/08/2016 1905   KETONESUR >80 (A) 10/08/2016 1905   PROTEINUR NEGATIVE 10/08/2016 1905   UROBILINOGEN 0.2 02/04/2014 0830   NITRITE NEGATIVE  10/08/2016 [place] 10/08/2016 1905   Sepsis Labs: @LABRCNTIP (procalcitonin:4,lacticidven:4) )No results found for this or any previous visit (from the past 240 hour(s)).   Radiological Exams on Admission: Ct Abdomen Pelvis W Contrast  Result Date: 10/08/2016 CLINICAL DATA:  Abdominal pain, nausea, vomiting, diarrhea for several days. Prior appendectomy. EXAM: CT ABDOMEN AND PELVIS WITH CONTRAST TECHNIQUE: Multidetector CT imaging of the abdomen and pelvis was performed using the standard protocol following bolus administration of intravenous contrast. CONTRAST:  177mL ISOVUE-300 IOPAMIDOL (ISOVUE-300) INJECTION 61% COMPARISON:  10/29/2015 CT abdomen/pelvis. FINDINGS: Lower chest: No significant pulmonary nodules or acute consolidative airspace disease. Hepatobiliary: Diffuse hepatic steatosis. Normal liver size. No definite liver surface irregularity. No liver mass. Normal gallbladder with no radiopaque cholelithiasis. No biliary ductal dilatation. Pancreas: Normal, with no mass or duct dilation. Spleen: Normal size. No mass. Adrenals/Urinary Tract: Normal adrenals. No hydronephrosis. Indeterminate 1.9 x 1.6 cm renal cortical mass in the lateral interpolar left kidney (series 2/ image 29) with density 63 HU, slightly increased from 1.6 x 1.3 cm on 10/29/2015. Exophytic simple 2.4 cm renal cyst in the  anterior upper left kidney. Additional subcentimeter hypodense renal cortical lesions in the left kidney, too small to characterize, not appreciably changed, for which no further follow-up is required. No significant bladder distention or definite bladder wall thickening. An ovoid 8 mm calcification at the posterior left bladder wall margin is stable and may represent a calcified venous phlebolith abutting the bladder wall. Stomach/Bowel: Grossly normal stomach. Normal caliber small bowel with no small bowel wall thickening. Normal large bowel with no diverticulosis, large bowel wall thickening or pericolonic fat stranding. Vascular/Lymphatic: Atherosclerotic nonaneurysmal abdominal aorta. Patent portal, splenic, hepatic and renal veins. No pathologically enlarged lymph nodes in the abdomen or pelvis. Reproductive: Top-normal size prostate. Other: No pneumoperitoneum, ascites or focal fluid collection. Stable small fat containing umbilical hernia. Musculoskeletal: No aggressive appearing focal osseous lesions. Mild thoracolumbar spondylosis. IMPRESSION: 1. No acute abnormality. No evidence of bowel obstruction or acute bowel inflammation. 2. Indeterminate 1.9 cm renal cortical mass in the lateral interpolar left kidney, mildly increased in size since 10/29/2015 CT abdomen/pelvis study. Recommend further evaluation with MRI abdomen without and with IV contrast on a short term outpatient basis. Renal cell carcinoma not excluded. 3. Diffuse hepatic steatosis. 4. Aortic atherosclerosis. Electronically Signed   By: Ilona Sorrel M.D.   On: 10/08/2016 21:20    EKG: Independently reviewed. Normal sinus rhythm.  Assessment/Plan Principal Problem:   Intractable nausea and vomiting Active Problems:   Essential hypertension   Renal mass, left   Nausea vomiting and diarrhea    1. Intractable nausea vomiting and diarrhea - differentials include gastroenteritis versus possible C. difficile. Check stool for C.  difficile and GI pathogen panel. Continue with hydration and I have placed patient on clear liquid diet. Check UDS. 2. Uncontrolled hypertension - since patient is not able to reliably take orally I have placed patient on when necessary IV hydralazine. 3. Left renal lesion - will need dedicated MRI once patient is stable. 4. Hyperlipidemia on statins to be resumed once patient can take orally. 5. History of nonobstructive CAD per cardiac cath in 2016.   DVT prophylaxis: Lovenox. Code Status: Full code.  Family Communication: Discussed patient.  Disposition Plan: Home.  Consults called: None.  Admission status: Observation.    Rise Patience MD [facility] Pager [phone].  If 7PM-7AM, please contact night-coverage [email or web address] Password [email or web address]  10/09/2016, 4:09 AM

## 2016-10-09 NOTE — Progress Notes (Signed)
CRITICAL VALUE ALERT  Critical Value:  Troponin 0.09  Date & Time Notied:  0858  Provider Notified: [id number]  Orders Received/Actions taken: 0908

## 2016-10-10 DIAGNOSIS — I2510 Atherosclerotic heart disease of native coronary artery without angina pectoris: Secondary | ICD-10-CM

## 2016-10-10 DIAGNOSIS — E785 Hyperlipidemia, unspecified: Secondary | ICD-10-CM

## 2016-10-10 DIAGNOSIS — R748 Abnormal levels of other serum enzymes: Secondary | ICD-10-CM

## 2016-10-10 LAB — CBC
HCT: 47.5 % (ref 39.0–52.0)
Hemoglobin: 16.3 g/dL (ref 13.0–17.0)
MCH: 31 pg (ref 26.0–34.0)
MCHC: 34.3 g/dL (ref 30.0–36.0)
MCV: 90.5 fL (ref 78.0–100.0)
Platelets: 146 10*3/uL — ABNORMAL LOW (ref 150–400)
RBC: 5.25 MIL/uL (ref 4.22–5.81)
RDW: 13.1 % (ref 11.5–15.5)
WBC: 8.8 10*3/uL (ref 4.0–10.5)

## 2016-10-10 LAB — BASIC METABOLIC PANEL
Anion gap: 12 (ref 5–15)
BUN: 12 mg/dL (ref 6–20)
CO2: 23 mmol/L (ref 22–32)
Calcium: 9 mg/dL (ref 8.9–10.3)
Chloride: 105 mmol/L (ref 101–111)
Creatinine, Ser: 1.15 mg/dL (ref 0.61–1.24)
GFR calc Af Amer: >60 mL/min (ref >60)
GFR calc non Af Amer: >60 mL/min (ref >60)
Glucose, Bld: 126 mg/dL — ABNORMAL HIGH (ref 65–99)
Potassium: 3.5 mmol/L (ref 3.5–5.1)
Sodium: 140 mmol/L (ref 135–145)

## 2016-10-10 MED ORDER — ONDANSETRON HCL 4 MG PO TABS: 4.0000 mg | ORAL_TABLET | Freq: Every day | ORAL | 1 refills | Status: DC | PRN

## 2016-10-10 MED ORDER — RANITIDINE HCL 150 MG PO TABS: 150.0000 mg | ORAL_TABLET | Freq: Two times a day (BID) | ORAL | 1 refills | Status: DC

## 2016-10-10 NOTE — Progress Notes (Signed)
Pt was given discharge instructions and all questions were answered. Patient is being taken to main entrance by wheelchair.

## 2016-10-10 NOTE — Discharge Summary (Signed)
Physician Discharge Summary  [name]:[id number] DOB: [birth year] DOA: 10/08/2016  PCP: Colon Branch, MD  Admit date: 10/08/2016 Discharge date: 10/10/2016  Time spent: 45 minutes  Recommendations for Outpatient Follow-up:  Patient will be discharged to home.  Patient will need to follow up with primary care provider within one week of discharge.  Patient should continue medications as prescribed.  Patient should follow a heart healthy diet.   Discharge Diagnoses:  Intractable nausea vomiting, diarrhea Left renal lesion Uncontrolled hypertension Hyperlipidemia History of nonobstructive coronary artery disease/ mildly elevated troponin GERD  Discharge Condition: Stable  Diet recommendation: Soft  Filed Weights   10/08/16 1754  Weight: 99.8 kg (220 lb)    History of present illness:  on 10/09/2016 by Dr. Kathlene Cote Shearinis a 56 y.o.malewith history of hypertension hyperlipidemia was recently on antibiotics for tooth infection started developing nausea vomiting and diarrhea over the last 4 days. 2 days after finishing his oral antibiotic course. Patient also has been having crampy abdominal pain mostly in the left flank area. Patient had taken some loperamide following which his diarrhea stopped yesterday but had persistent vomiting.  Hospital Course:  Intractable nausea vomiting, diarrhea -Possible gastroenteritis  -improved  -CT abdomen and pelvis showed no acute abnormality, no evidence of bowel obstruction or acute bowel inflammation -C. Difficile and GI pathogen panel negative -Patient has been seen by Dr. Fuller Plan, gastroenterology in the past. -Diet advanced, patient able to tolerate soft diet. -Will discharge patient with antiemetics as needed. -Patient feels this may be related to "food poisoning" he experienced in 2014 after eating raw tuna. Discussed with infectious disease, Dr. Megan Salon, via phone, recurrently episodes likely unrelated to  2014. No further testing recommended.  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  Left renal lesion -CT abdomen and pelvis showed indeterminate 1.9 cm renal cortical mass in the lateral interpolar left kidney -MRI abdomen without and with IV contrast: left kidney Bosniak category 1 and 2 lesions. No suspicious lesions identified.   Uncontrolled hypertension -Patient on Cozaar at home  Hyperlipidemia -Continue statin  History of nonobstructive coronary artery disease/ mildly elevated troponin -Cardiac cath in 2016 -Troponin peaked at 0.09, but trending downward -Suspect elevated troponin likely due to demand ischemia from intractable nausea and vomiting -Patient currently chest pain-free -Continue statin  GERD -Continue PPI and add on Zantac -patient may need to follow up with Gastroenterology   Consultants Infectious disease, via phone.  Procedures  None  Discharge Exam: Vitals:   10/09/16 2115 10/10/16 0506  BP: (!) 146/88 129/81  Pulse: 76 71  Resp: 18 18  Temp: 98.6 F (37 C) 98.3 F (36.8 C)   Patient feels his abdominal pain has improved and resolved. Feels his nausea and vomiting, diarrhea have also improved with no further episodes.   General: Well developed, well nourished, NAD, appears stated age  HEENT: NCAT, mucous membranes moist.  Cardiovascular: S1 S2 auscultated, no rubs, murmurs or gallops. Regular rate and rhythm.  Respiratory: Clear to auscultation bilaterally with equal chest rise  Abdomen: Soft, obese, nontender, nondistended, + bowel sounds  Extremities: warm dry without cyanosis clubbing or edema  Neuro: AAOx3, nonfocal  Psych: Normal affect and demeanor with intact judgement and insight, pleasant  Discharge Instructions Discharge Instructions    Discharge instructions    Complete by:  As directed    Patient will be discharged to home.  Patient will need to follow up with primary care  provider  within one week of discharge.  Patient should continue medications as prescribed.  Patient should follow a heart healthy diet.     Current Discharge Medication List    START taking these medications   Details  ondansetron (ZOFRAN) 4 MG tablet Take 1 tablet (4 mg total) by mouth daily as needed for nausea or vomiting. Qty: 30 tablet, Refills: 1    ranitidine (ZANTAC) 150 MG tablet Take 1 tablet (150 mg total) by mouth 2 (two) times daily. Qty: 60 tablet, Refills: 1      CONTINUE these medications which have NOT CHANGED   Details  atorvastatin (LIPITOR) 20 MG tablet Take 1 tablet (20 mg total) by mouth at bedtime. Qty: 30 tablet, Refills: 5    Fructose-Dextrose-Phosphor Acd (NAUSETROL PO) Take 1 tablet by mouth daily as needed.    lansoprazole (PREVACID) 30 MG capsule Take 1 capsule (30 mg total) by mouth daily at 12 noon. Qty: 30 capsule, Refills: 5    loperamide (IMODIUM A-D) 2 MG tablet Take 4 mg by mouth 4 (four) times daily as needed for diarrhea or loose stools.    losartan (COZAAR) 100 MG tablet Take 1 tablet (100 mg total) by mouth daily. Qty: 30 tablet, Refills: 3    traZODone (DESYREL) 50 MG tablet Take 0.5-1 tablets (25-50 mg total) by mouth at bedtime as needed for sleep. Qty: 30 tablet, Refills: 3       No Known Allergies Follow-up Information    Colon Branch, MD. Schedule an appointment as soon as possible for a visit in 1 week(s).   Specialty:  Internal Medicine Why:  Hospital follow up Contact information: [address] [place] [address] [phone]        Jeffrey Artist, MD. Schedule an appointment as soon as possible for a visit.   Specialty:  Gastroenterology Why:  As needed Contact information: [address] [place] [address] [phone]            The results of significant diagnostics from this hospitalization (including imaging, microbiology, ancillary and laboratory) are listed below for  reference.    Significant Diagnostic Studies: Mr Abdomen W Wo Contrast  Result Date: 10/10/2016 CLINICAL DATA:  Evaluate kidney mass EXAM: MRI ABDOMEN WITHOUT AND WITH CONTRAST TECHNIQUE: Multiplanar multisequence MR imaging of the abdomen was performed both before and after the administration of intravenous contrast. CONTRAST:  19mL MULTIHANCE GADOBENATE DIMEGLUMINE 529 MG/ML IV SOLN COMPARISON:  10/08/2016 FINDINGS: Lower chest: No acute findings. Hepatobiliary: Diffuse hepatic steatosis. No mass identified. The gallbladder appears normal. No biliary dilatation. Pancreas: No mass, inflammatory changes, or other parenchymal abnormality identified. Spleen:  Within normal limits in size and appearance. Adrenals/Urinary Tract:  The adrenal glands appear normal. Normal appearance of the right kidney.  No mass or hydronephrosis. Within the lateral cortex of the left mid kidney there is a 1.6 cm T1 hyperintense structure without enhancement. There are 2 adjacent T2 hyperintense structures also without enhancement compatible with simple appearing satellite cysts. Arising from the upper pole the left kidney is a 2.6 cm simple appearing cyst. No solid enhancing masses or complex cysts with enhancing components identified. Stomach/Bowel: Visualized portions within the abdomen are unremarkable. Vascular/Lymphatic: No pathologically enlarged lymph nodes identified. No abdominal aortic aneurysm demonstrated. Other:  None. Musculoskeletal: No suspicious bone lesions identified. IMPRESSION: 1. Left kidney Bosniak category 1 and 2 lesions. No suspicious enhancing kidney lesions identified. 2. Hepatic steatosis. Electronically Signed   By: Kerby Moors  M.D.   On: 10/10/2016 08:34   Ct Abdomen Pelvis W Contrast  Result Date: 10/08/2016 CLINICAL DATA:  Abdominal pain, nausea, vomiting, diarrhea for several days. Prior appendectomy. EXAM: CT ABDOMEN AND PELVIS WITH CONTRAST TECHNIQUE: Multidetector CT imaging of the abdomen  and pelvis was performed using the standard protocol following bolus administration of intravenous contrast. CONTRAST:  154mL ISOVUE-300 IOPAMIDOL (ISOVUE-300) INJECTION 61% COMPARISON:  10/29/2015 CT abdomen/pelvis. FINDINGS: Lower chest: No significant pulmonary nodules or acute consolidative airspace disease. Hepatobiliary: Diffuse hepatic steatosis. Normal liver size. No definite liver surface irregularity. No liver mass. Normal gallbladder with no radiopaque cholelithiasis. No biliary ductal dilatation. Pancreas: Normal, with no mass or duct dilation. Spleen: Normal size. No mass. Adrenals/Urinary Tract: Normal adrenals. No hydronephrosis. Indeterminate 1.9 x 1.6 cm renal cortical mass in the lateral interpolar left kidney (series 2/ image 29) with density 63 HU, slightly increased from 1.6 x 1.3 cm on 10/29/2015. Exophytic simple 2.4 cm renal cyst in the anterior upper left kidney. Additional subcentimeter hypodense renal cortical lesions in the left kidney, too small to characterize, not appreciably changed, for which no further follow-up is required. No significant bladder distention or definite bladder wall thickening. An ovoid 8 mm calcification at the posterior left bladder wall margin is stable and may represent a calcified venous phlebolith abutting the bladder wall. Stomach/Bowel: Grossly normal stomach. Normal caliber small bowel with no small bowel wall thickening. Normal large bowel with no diverticulosis, large bowel wall thickening or pericolonic fat stranding. Vascular/Lymphatic: Atherosclerotic nonaneurysmal abdominal aorta. Patent portal, splenic, hepatic and renal veins. No pathologically enlarged lymph nodes in the abdomen or pelvis. Reproductive: Top-normal size prostate. Other: No pneumoperitoneum, ascites or focal fluid collection. Stable small fat containing umbilical hernia. Musculoskeletal: No aggressive appearing focal osseous lesions. Mild thoracolumbar spondylosis. IMPRESSION: 1. No  acute abnormality. No evidence of bowel obstruction or acute bowel inflammation. 2. Indeterminate 1.9 cm renal cortical mass in the lateral interpolar left kidney, mildly increased in size since 10/29/2015 CT abdomen/pelvis study. Recommend further evaluation with MRI abdomen without and with IV contrast on a short term outpatient basis. Renal cell carcinoma not excluded. 3. Diffuse hepatic steatosis. 4. Aortic atherosclerosis. Electronically Signed   By: Ilona Sorrel M.D.   On: 10/08/2016 21:20    Microbiology: Recent Results (from the past 240 hour(s))  C difficile quick screen w PCR reflex     Status: None   Collection Time: 10/09/16  7:10 AM  Result Value Ref Range Status   C Diff antigen NEGATIVE NEGATIVE Final   C Diff toxin NEGATIVE NEGATIVE Final   C Diff interpretation No C. difficile detected.  Final  Gastrointestinal Panel by PCR , Stool     Status: None   Collection Time: 10/09/16  7:10 AM  Result Value Ref Range Status   Campylobacter species NOT DETECTED NOT DETECTED Final   Plesimonas shigelloides NOT DETECTED NOT DETECTED Final   Salmonella species NOT DETECTED NOT DETECTED Final   Yersinia enterocolitica NOT DETECTED NOT DETECTED Final   Vibrio species NOT DETECTED NOT DETECTED Final   Vibrio cholerae NOT DETECTED NOT DETECTED Final   Enteroaggregative E coli (EAEC) NOT DETECTED NOT DETECTED Final   Enteropathogenic E coli (EPEC) NOT DETECTED NOT DETECTED Final   Enterotoxigenic E coli (ETEC) NOT DETECTED NOT DETECTED Final   Shiga like toxin producing E coli (STEC) NOT DETECTED NOT DETECTED Final   Shigella/Enteroinvasive E coli (EIEC) NOT DETECTED NOT DETECTED Final   Cryptosporidium NOT DETECTED NOT DETECTED Final  Cyclospora cayetanensis NOT DETECTED NOT DETECTED Final   Entamoeba histolytica NOT DETECTED NOT DETECTED Final   Giardia lamblia NOT DETECTED NOT DETECTED Final   Adenovirus F40/41 NOT DETECTED NOT DETECTED Final   Astrovirus NOT DETECTED NOT DETECTED  Final   Norovirus GI/GII NOT DETECTED NOT DETECTED Final   Rotavirus A NOT DETECTED NOT DETECTED Final   Sapovirus (I, II, IV, and V) NOT DETECTED NOT DETECTED Final     Labs: Basic Metabolic Panel:  Recent Labs Lab 10/08/16 1816 10/09/16 0418 10/09/16 0809 10/10/16 0522  NA 137 139  --  140  K 3.7 3.6  --  3.5  CL 100* 103  --  105  CO2 20* 26  --  23  GLUCOSE 103* 95  --  126*  BUN 18 13  --  12  CREATININE 1.16 1.13  --  1.15  CALCIUM 9.2 8.7*  --  9.0  MG  --   --  2.1  --    Liver Function Tests:  Recent Labs Lab 10/08/16 1816 10/09/16 0418  AST 33 32  ALT 24 24  ALKPHOS 47 45  BILITOT 1.4* 1.9*  PROT 7.6 7.3  ALBUMIN 4.4 4.1    Recent Labs Lab 10/08/16 1816  LIPASE 28   No results for input(s): AMMONIA in the last 168 hours. CBC:  Recent Labs Lab 10/08/16 1816 10/09/16 0418 10/10/16 0522  WBC 11.0* 9.1 8.8  NEUTROABS  --  3.7  --   HGB 16.3 15.5 16.3  HCT 45.1 44.1 47.5  MCV 88.3 90.4 90.5  PLT 184 165 146*   Cardiac Enzymes:  Recent Labs Lab 10/09/16 0809 10/09/16 1458 10/09/16 2047  TROPONINI 0.09* 0.06* 0.05*   BNP: BNP (last 3 results) No results for input(s): BNP in the last 8760 hours.  ProBNP (last 3 results) No results for input(s): PROBNP in the last 8760 hours.  CBG: No results for input(s): GLUCAP in the last 168 hours.     SignedCristal Ford  [facility] 10/10/2016, 12:02 PM

## 2016-10-10 NOTE — Discharge Instructions (Signed)
Food Choices to Help Relieve Diarrhea, Adult When you have diarrhea, the foods you eat and your eating habits are very important. Choosing the right foods and drinks can help:  Relieve diarrhea.  Replace lost fluids and nutrients.  Prevent dehydration.  What general guidelines should I follow? Relieving diarrhea  Choose foods with less than 2 g or .07 oz. of fiber per serving.  Limit fats to less than 8 tsp (38 g or 1.34 oz.) a day.  Avoid the following: ? Foods and beverages sweetened with high-fructose corn syrup, honey, or sugar alcohols such as xylitol, sorbitol, and mannitol. ? Foods that contain a lot of fat or sugar. ? Fried, greasy, or spicy foods. ? High-fiber grains, breads, and cereals. ? Raw fruits and vegetables.  Eat foods that are rich in probiotics. These foods include dairy products such as yogurt and fermented milk products. They help increase healthy bacteria in the stomach and intestines (gastrointestinal tract, or GI tract).  If you have lactose intolerance, avoid dairy products. These may make your diarrhea worse.  Take medicine to help stop diarrhea (antidiarrheal medicine) only as told by your health care provider. Replacing nutrients  Eat small meals or snacks every 3-4 hours.  Eat bland foods, such as white rice, toast, or baked potato, until your diarrhea starts to get better. Gradually reintroduce nutrient-rich foods as tolerated or as told by your health care provider. This includes: ? Well-cooked protein foods. ? Peeled, seeded, and soft-cooked fruits and vegetables. ? Low-fat dairy products.  Take vitamin and mineral supplements as told by your health care provider. Preventing dehydration   Start by sipping water or a special solution to prevent dehydration (oral rehydration solution, ORS). Urine that is clear or pale yellow means that you are getting enough fluid.  Try to drink at least 8-10 cups of fluid each day to help replace lost  fluids.  You may add other liquids in addition to water, such as clear juice or decaffeinated sports drinks, as tolerated or as told by your health care provider.  Avoid drinks with caffeine, such as coffee, tea, or soft drinks.  Avoid alcohol. What foods are recommended? The items listed may not be a complete list. Talk with your health care provider about what dietary choices are best for you. Grains White rice. White, French, or pita breads (fresh or toasted), including plain rolls, buns, or bagels. White pasta. Saltine, soda, or graham crackers. Pretzels. Low-fiber cereal. Cooked cereals made with water (such as cornmeal, farina, or cream cereals). Plain muffins. Matzo. Melba toast. Zwieback. Vegetables Potatoes (without the skin). Most well-cooked and canned vegetables without skins or seeds. Tender lettuce. Fruits Apple sauce. Fruits canned in juice. Cooked apricots, cherries, grapefruit, peaches, pears, or plums. Fresh bananas and cantaloupe. Meats and other protein foods Baked or boiled chicken. Eggs. Tofu. Fish. Seafood. Smooth nut butters. Ground or well-cooked tender beef, ham, veal, lamb, pork, or poultry. Dairy Plain yogurt, kefir, and unsweetened liquid yogurt. Lactose-free milk, buttermilk, skim milk, or soy milk. Low-fat or nonfat hard cheese. Beverages Water. Low-calorie sports drinks. Fruit juices without pulp. Strained tomato and vegetable juices. Decaffeinated teas. Sugar-free beverages not sweetened with sugar alcohols. Oral rehydration solutions, if approved by your health care provider. Seasoning and other foods Bouillon, broth, or soups made from recommended foods. What foods are not recommended? The items listed may not be a complete list. Talk with your health care provider about what dietary choices are best for you. Grains Whole grain, whole wheat,   bran, or rye breads, rolls, pastas, and crackers. Wild or brown rice. Whole grain or bran cereals. Barley. Oats and  oatmeal. Corn tortillas or taco shells. Granola. Popcorn. Vegetables Raw vegetables. Fried vegetables. Cabbage, broccoli, Brussels sprouts, artichokes, baked beans, beet greens, corn, kale, legumes, peas, sweet potatoes, and yams. Potato skins. Cooked spinach and cabbage. Fruits Dried fruit, including raisins and dates. Raw fruits. Stewed or dried prunes. Canned fruits with syrup. Meat and other protein foods Fried or fatty meats. Deli meats. Chunky nut butters. Nuts and seeds. Beans and lentils. Bacon. Hot dogs. Sausage. Dairy High-fat cheeses. Whole milk, chocolate milk, and beverages made with milk, such as milk shakes. Half-and-half. Cream. sour cream. Ice cream. Beverages Caffeinated beverages (such as coffee, tea, soda, or energy drinks). Alcoholic beverages. Fruit juices with pulp. Prune juice. Soft drinks sweetened with high-fructose corn syrup or sugar alcohols. High-calorie sports drinks. Fats and oils Butter. Cream sauces. Margarine. Salad oils. Plain salad dressings. Olives. Avocados. Mayonnaise. Sweets and desserts Sweet rolls, doughnuts, and sweet breads. Sugar-free desserts sweetened with sugar alcohols such as xylitol and sorbitol. Seasoning and other foods Honey. Hot sauce. Chili powder. Gravy. Cream-based or milk-based soups. Pancakes and waffles. Summary  When you have diarrhea, the foods you eat and your eating habits are very important.  Make sure you get at least 8-10 cups of fluid each day, or enough to keep your urine clear or pale yellow.  Eat bland foods and gradually reintroduce healthy, nutrient-rich foods as tolerated, or as told by your health care provider.  Avoid high-fiber, fried, greasy, or spicy foods. This information is not intended to replace advice given to you by your health care provider. Make sure you discuss any questions you have with your health care provider. Document Released: 07/01/2003 Document Revised: 04/07/2016 Document Reviewed:  04/07/2016 Elsevier Interactive Patient Education  2017 Elsevier Inc.  

## 2016-10-11 ENCOUNTER — Telehealth: Payer: Self-pay | Admitting: Behavioral Health

## 2016-10-11 NOTE — Telephone Encounter (Signed)
Appointment for hospital follow-up scheduled for 10/13/16 at 11:15 AM with Dr. Nani Ravens.

## 2016-10-13 ENCOUNTER — Telehealth: Payer: Self-pay | Admitting: Internal Medicine

## 2016-10-13 ENCOUNTER — Encounter: Payer: Self-pay | Admitting: Family Medicine

## 2016-10-13 ENCOUNTER — Other Ambulatory Visit: Payer: Self-pay | Admitting: Family Medicine

## 2016-10-13 ENCOUNTER — Ambulatory Visit (INDEPENDENT_AMBULATORY_CARE_PROVIDER_SITE_OTHER): Payer: BLUE CROSS/BLUE SHIELD | Admitting: Family Medicine

## 2016-10-13 VITALS — BP 118/60 | HR 86 | Temp 98.0°F | Ht 71.0 in | Wt 216.6 lb

## 2016-10-13 DIAGNOSIS — R110 Nausea: Secondary | ICD-10-CM

## 2016-10-13 DIAGNOSIS — I700 Atherosclerosis of aorta: Secondary | ICD-10-CM | POA: Diagnosis not present

## 2016-10-13 DIAGNOSIS — R197 Diarrhea, unspecified: Secondary | ICD-10-CM

## 2016-10-13 DIAGNOSIS — R109 Unspecified abdominal pain: Secondary | ICD-10-CM

## 2016-10-13 DIAGNOSIS — H1031 Unspecified acute conjunctivitis, right eye: Secondary | ICD-10-CM | POA: Diagnosis not present

## 2016-10-13 DIAGNOSIS — Z09 Encounter for follow-up examination after completed treatment for conditions other than malignant neoplasm: Secondary | ICD-10-CM

## 2016-10-13 LAB — BASIC METABOLIC PANEL
BUN: 21 mg/dL (ref 6–23)
CO2: 24 mEq/L (ref 19–32)
Calcium: 9.4 mg/dL (ref 8.4–10.5)
Chloride: 106 mEq/L (ref 96–112)
Creatinine, Ser: 0.96 mg/dL (ref 0.40–1.50)
GFR: 105.16 mL/min (ref >60.00)
Glucose, Bld: 97 mg/dL (ref 70–99)
Potassium: 4 mEq/L (ref 3.5–5.1)
Sodium: 139 mEq/L (ref 135–145)

## 2016-10-13 LAB — CBC
HCT: 43.5 % (ref 39.0–52.0)
Hemoglobin: 14.6 g/dL (ref 13.0–17.0)
MCHC: 33.6 g/dL (ref 30.0–36.0)
MCV: 95.6 fl (ref 78.0–100.0)
Platelets: 141 10*3/uL — ABNORMAL LOW (ref 150.0–400.0)
RBC: 4.55 Mil/uL (ref 4.22–5.81)
RDW: 14.1 % (ref 11.5–15.5)
WBC: 9.6 10*3/uL (ref 4.0–10.5)

## 2016-10-13 LAB — GAMMA GT: GGT: 26 U/L (ref 7–51)

## 2016-10-13 LAB — IGA: IgA: 307 mg/dL (ref 68–378)

## 2016-10-13 MED ORDER — ONDANSETRON 4 MG PO TBDP
4.0000 mg | ORAL_TABLET | Freq: Once | ORAL | Status: AC
  Administered 2016-10-13: 4 mg via ORAL

## 2016-10-13 MED ORDER — TOBRAMYCIN 0.3 % OP SOLN: 1.0000 [drp] | OPHTHALMIC | 0 refills | Status: DC

## 2016-10-13 MED ORDER — DICYCLOMINE HCL 20 MG PO TABS: 20.0000 mg | ORAL_TABLET | Freq: Three times a day (TID) | ORAL | 1 refills | Status: DC

## 2016-10-13 NOTE — Progress Notes (Signed)
Chief Complaint  Patient presents with  . Hospitalization Follow-up    for N/V and Diarrhea-;pt states still having stomach cramps,nausea,and diarrhea in the am    HPI [name] is a 56 y.o. y.o. male who presents for a transition of care visit.  Pt was discharged from [facility] 10/10/16.  Within 48 business hours of discharge our office contacted him via telephone to coordinate his care and needs.   Admitted for N/V/D. Since being discharged, he has stopped vomiting but still has nausea and diarrhea with abdominal cramping. It is worse in the morning. He is to follow-up with his gastroenterologist, Dr. Fuller Plan, on 11/13/16. He is doing his best to drink fluids. Zofran helps however it was written for once daily. If he takes it 3 times a day it helps control his misery. He continues to have loose stools as well. He has been using Imodium. He does not use any fiber supplements. He notices that his stool has been dark reddish lately. He is unsure if this is related to medication. He denies any fevers, urinary complaints, history of celiac disease, or rashes.  He also states he scratches eye yesterday. Has been red and slightly painful. Vision is unchanged. He was placed on drops for this in the past and helped. He frequently uses artificial tears.  Social History   Social History  . Marital status: Married   Occupational History  . Executive [employer]    Social History Main Topics  . Smoking status: Current Some Day Smoker    Packs/day: 0.25    Years: 18.00    Types: Cigarettes  . Smokeless tobacco: Never Used     Comment:  occ smoker 1-2 cigs per week  . Alcohol use Yes     Comment: rare   . Drug use: No   Social History Narrative   HS[employer]   Culinary school in [place]   Married - '84 - 5 years divorced; remarried '96   1 son - '85   Past Medical History:  Diagnosis Date  . BPH (benign prostatic hyperplasia)   . Chest pain, atypical   . Cluster headache    hx of  .  Complication of anesthesia    difficult waking up"  . Drug abuse    hx of  . Dyspnea on exertion   . GERD (gastroesophageal reflux disease)   . Hiatal hernia   . Hyperlipidemia   . Hypertension   . Hypothyroidism   . Nontoxic multinodular goiter   . Pulmonary nodule --- CT 10/19/2014: Nodule is stable, no further routine x-rays 01/06/2009  . TIA (transient ischemic attack)   . Trigeminal neuralgia   . Unspecified asthma(493.90)    Family History  Problem Relation Age of Onset  . Diabetes Mother   . Hyperlipidemia Mother   . Hypertension Mother   . Hypertension Father   . Heart disease Father        CAD/CHF  . Cancer Father        Mesothelioma  . Heart disease Maternal Aunt   . Prostate cancer Neg Hx   . Colon cancer Neg Hx    Allergies as of 10/13/2016   No Known Allergies     Medication List       Accurate as of 10/13/16 12:44 PM. Always use your most recent med list.          amoxicillin 500 MG capsule Commonly known as:  AMOXIL Take 1 capsule by mouth every 6 (six)  hours.   atorvastatin 20 MG tablet Commonly known as:  LIPITOR Take 1 tablet (20 mg total) by mouth at bedtime.   dicyclomine 20 MG tablet Commonly known as:  BENTYL Take 1 tablet (20 mg total) by mouth 3 (three) times daily before meals.   HYDROcodone-acetaminophen 5-325 MG tablet Commonly known as:  NORCO/VICODIN Take 2 tablets by mouth every 6 (six) hours as needed.   IMODIUM A-D 2 MG tablet Generic drug:  loperamide Take 4 mg by mouth 4 (four) times daily as needed for diarrhea or loose stools.   lansoprazole 30 MG capsule Commonly known as:  PREVACID Take 1 capsule (30 mg total) by mouth daily at 12 noon.   losartan 100 MG tablet Commonly known as:  COZAAR Take 1 tablet (100 mg total) by mouth daily.   NAUSETROL PO Take 1 tablet by mouth daily as needed.   ondansetron 4 MG tablet Commonly known as:  ZOFRAN Take 1 tablet (4 mg total) by mouth daily as needed for nausea or  vomiting.   ranitidine 150 MG tablet Commonly known as:  ZANTAC Take 1 tablet (150 mg total) by mouth 2 (two) times daily.   tobramycin 0.3 % ophthalmic solution Commonly known as:  TOBREX Place 1 drop into the right eye every 4 (four) hours.   traZODone 50 MG tablet Commonly known as:  DESYREL Take 0.5-1 tablets (25-50 mg total) by mouth at bedtime as needed for sleep.       ROS:  Constitutional: No fevers or chills, no weight loss HEENT: No headaches, hearing loss, or runny nose, no sore throat Heart: No chest pain Lungs: No SOB, no cough Abd: No bowel changes, no pain, no N/V GU: No urinary complaints Neuro: No numbness, tingling or weakness Msk: No joint or muscle pain  Objective BP 118/60 (BP Location: Left Arm, Patient Position: Sitting, Cuff Size: Large)   Pulse 86   Temp 98 F (36.7 C) (Oral)   Ht 5\' 11"  (1.803 m)   Wt 216 lb 9.6 oz (98.2 kg)   SpO2 98%   BMI 30.21 kg/m  General Appearance:  awake, alert, oriented, in no acute distress and well developed, well nourished Skin:  there are no suspicious lesions or rashes of concern Head/face:  NCAT Eyes:  EOMI, PERRLA, injected sclera on R Ears:  canals and TMs NI Nose/Sinuses:  negative Mouth/Throat:  Mucosa moist, no lesions; pharynx without erythema, edema or exudate. Neck:  neck- supple, no mass, non-tender and no jvd Lungs: Clear to auscultation.  No rales, rhonchi, or wheezing. Normal effort, no accessory muscle use. Heart:  Heart sounds are normal.  Regular rate and rhythm without murmur, gallop or rub. No bruits. Abdomen:  BS+, soft, diffusely TTP, ND, no masses or organomegaly Musculoskeletal:  No muscle group atrophy or asymmetry, gait normal Neurologic:  Alert and oriented x 3, gait normal., reflexes normal and symmetric, strength and  sensation grossly normal Psych exam: Nml mood and affect, age appropriate judgment and insight  Hospital discharge follow-up - Plan: CBC, Basic metabolic  panel  Nausea  Diarrhea, unspecified type - Plan: IgA, Gamma GT, Endomysial IgA Antibody, CANCELED: Endomysial IgA Antibody  Abdominal cramping - Plan: IgA, Gamma GT, dicyclomine (BENTYL) 20 MG tablet, CANCELED: Endomysial IgA Antibody  Aortic atherosclerosis (HCC)  Acute conjunctivitis of right eye, unspecified acute conjunctivitis type - Plan: tobramycin (TOBREX) 0.3 % ophthalmic solution   F/u on inpt labs. Screen for Celiac's given recurrent nature of his GI illness. Cont Zofran  and Immodium. Keep appt with GI, he is on waiting list if there is cancellation. Metamucil to help bulk up his stool as cultures were neg. Push fluids and electrolytes. Elevate HOB for flaring GERD in setting of nausea.   Brought up scratching his R eye at end of appt. Will call in abx drops to cover for developing bacterial infection.   Discharge summary and medication list have been reviewed/reconciled.  Labs pending at the time of discharge have been reviewed or are still pending at the time of this visit.  Follow-up labs and appointments have been ordered and/or coordinated appropriately. Educational materials regarding the patient's admitting diagnosis provided.   TRANSITIONAL CARE MANAGEMENT CERTIFICATION:  I certify the following are true:   1. Communication with the patient/care giver was made within 2 business days of discharge.  2. Complexity of Medical decision making is Complex.  3. Face to face visit occurred within 7 days of discharge.   F/u as originally scheduled with reg PCP. The patient and his wife voiced understanding and agreement to the plan.  [place], DO 10/13/16 12:44 PM

## 2016-10-13 NOTE — Patient Instructions (Addendum)
Continue Zofran. Bentyl for cramping. Give [place] 2-3 business days to get the results of your labs back.  See GI as soon as possible. Try using Metamucil once daily to bulk up your stools.  Artificial tears. Keep a symptom diary.  Keep the head of your bed elevated. Keep pushing fluids. Electrolytes are also important.

## 2016-10-13 NOTE — Telephone Encounter (Signed)
OK w me.  

## 2016-10-13 NOTE — Addendum Note (Signed)
Addended by: Harl Bowie on: 10/13/2016 03:21 PM   Modules accepted: Orders

## 2016-10-13 NOTE — Telephone Encounter (Signed)
Patient would like to transfer from Dr. [name] to Dr. Nani Ravens, please advise

## 2016-10-15 NOTE — Telephone Encounter (Signed)
Ok w/ me 

## 2016-10-16 ENCOUNTER — Telehealth: Payer: Self-pay | Admitting: Internal Medicine

## 2016-10-16 MED ORDER — PROMETHAZINE HCL 25 MG PO TABS: 25.0000 mg | ORAL_TABLET | Freq: Three times a day (TID) | ORAL | 0 refills | Status: DC | PRN

## 2016-10-16 NOTE — Telephone Encounter (Signed)
Called and spoke with the pt and informed him of the message below.  Pt verbalized understanding and agreed.//AB/CMA

## 2016-10-16 NOTE — Telephone Encounter (Signed)
Alt called in. Don't take on same days of use with Zofran.

## 2016-10-16 NOTE — Telephone Encounter (Signed)
Patient scheduled for 11/24/2016 with Dr. Nani Ravens

## 2016-10-16 NOTE — Telephone Encounter (Signed)
Caller name: Renardo Cheatum Relationship to patient: self Can be reached:  [phone] Pharmacy: [facility], [place]. [address]  Reason for call: Pt ins will only cover 9 zofran per month. Pt has begun throwing up again and is asking if there is an alternate medication that can be prescribed for him.

## 2016-10-16 NOTE — Telephone Encounter (Signed)
Please advise.//AB/CMA 

## 2016-10-17 LAB — ENDOMYSIAL IGA ANTIBODY: Endomysial IgA: NEGATIVE

## 2016-10-31 ENCOUNTER — Other Ambulatory Visit: Payer: Self-pay | Admitting: Family Medicine

## 2016-10-31 HISTORY — PX: CYST EXCISION: SHX5701

## 2016-11-06 NOTE — Telephone Encounter (Signed)
Approved and sent to the pharmacy.//AB/CMA

## 2016-11-06 NOTE — Telephone Encounter (Signed)
Requesting Phenergan 25mg -Take 1 tablet by mouth every 8 hours as needed for nausea or vomiting. Last refill:10/16/16;#30,0 Last OV:10/13/16 Please advise.//AB/CMA

## 2016-11-13 ENCOUNTER — Encounter: Payer: Self-pay | Admitting: Gastroenterology

## 2016-11-13 ENCOUNTER — Ambulatory Visit (INDEPENDENT_AMBULATORY_CARE_PROVIDER_SITE_OTHER): Payer: BLUE CROSS/BLUE SHIELD | Admitting: Gastroenterology

## 2016-11-13 VITALS — BP 126/84 | HR 80 | Ht 69.0 in | Wt 227.0 lb

## 2016-11-13 DIAGNOSIS — K219 Gastro-esophageal reflux disease without esophagitis: Secondary | ICD-10-CM | POA: Diagnosis not present

## 2016-11-13 DIAGNOSIS — R197 Diarrhea, unspecified: Secondary | ICD-10-CM

## 2016-11-13 DIAGNOSIS — R110 Nausea: Secondary | ICD-10-CM

## 2016-11-13 MED ORDER — LANSOPRAZOLE 30 MG PO CPDR: 30.0000 mg | DELAYED_RELEASE_CAPSULE | Freq: Two times a day (BID) | ORAL | 5 refills | Status: DC

## 2016-11-13 MED ORDER — ONDANSETRON HCL 8 MG PO TABS: 8.0000 mg | ORAL_TABLET | Freq: Three times a day (TID) | ORAL | 0 refills | Status: DC | PRN

## 2016-11-13 MED ORDER — RANITIDINE HCL 150 MG PO TABS: 150.0000 mg | ORAL_TABLET | Freq: Every day | ORAL | 5 refills | Status: DC

## 2016-11-13 NOTE — Patient Instructions (Signed)
You have been scheduled for an endoscopy. Please follow written instructions given to you at your visit today. If you use inhalers (even only as needed), please bring them with you on the day of your procedure. Your physician has requested that you go to www.startemmi.com and enter the access code given to you at your visit today. This web site gives a general overview about your procedure. However, you should still follow specific instructions given to you by our office regarding your preparation for the procedure.   We have sent the following medications to your pharmacy for you to pick up at your convenience:  Zofran, 8mg   Please take your Prevacid 30mg   twice a day. Take your ranitidine at bedtime.  Normal BMI (Body Mass Index- based on height and weight) is between 19 and 25. Your BMI today is Body mass index is 33.52 kg/m. Marland Kitchen Please consider follow up  regarding your BMI with your Primary Care Provider.  Thank you for choosing me and [facility].  Pricilla Riffle. Dagoberto Ligas., MD., Marval Regal

## 2016-11-13 NOTE — Progress Notes (Signed)
    History of Present Illness: This is a 56 year old male hospitalized in 09/2016 for N/V/D following a course of antibiotics. GI pathogen panel was negative. C diff was negative. Abd/pelvic CT was negative for GI pathology. His vomiting completely resolved. He now has loose stools about once or twice per week with unclear etiology. He notes intermittent LLQ abdominal cramping as well, sometimes associated with diarrhea, sometimes not. He note intermittent solid food dysphagia for 1 year. He notes frequent nausea not necessarily at the same time of day. Not necessarily related to meals. It is often bothersome in the morning. Zofran 4 mg does not help his nausea. Phenergan helps nausea but results in drowsiness.  Colonoscopy 12/2015  - Five 5 to 8 mm polyps in the rectum, in the sigmoid colon and in the transverse colon, removed with a cold snare. Resected and retrieved. - Internal hemorrhoids. - Diverticulosis in the sigmoid colon and in the descending colon. - The examined portion of the ileum was normal. - Biopsies were taken with a cold forceps from the ascending colon, transverse colon, descending colon and sigmoid colon for evaluation of microscopic colitis.   Current Medications, Allergies, Past Medical History, Past Surgical History, Family History and Social History were reviewed in [employer] record.  Physical Exam: General: Well developed, well nourished, no acute distress Head: Normocephalic and atraumatic Eyes:  sclerae anicteric, EOMI Ears: Normal auditory acuity Mouth: No deformity or lesions Lungs: Clear throughout to auscultation Heart: Regular rate and rhythm; no murmurs, rubs or bruits Abdomen: Soft, non tender and non distended. No masses, hepatosplenomegaly or hernias noted. Normal Bowel sounds Musculoskeletal: Symmetrical with no gross deformities  Pulses:  Normal pulses noted Extremities: No clubbing, cyanosis, edema or deformities  noted Neurological: Alert oriented x 4, grossly nonfocal Psychological:  Alert and cooperative. Normal mood and affect  Assessment and Recommendations:  1. Nausea without vomiting. Etiology unclear. R/O GERD, ulcer. Intensify antireflux measures. Change Prevacid to 30 mg by mouth twice a day before breakfast and dinner and change ranitidine to at bedtime. Trial of Zofran 8 mg 3 times a day when necessary and if not effective Phenergan 12.5-25 mg 3 times a day when necessary. Schedule EGD. The risks (including bleeding, perforation, infection, missed lesions, medication reactions and possible hospitalization or surgery if complications occur), benefits, and alternatives to endoscopy with possible biopsy and possible dilation were discussed with the patient and they consent to proceed.   2. Suspected IBS leading to intermittent diarrhea and LLQ cramping. Avoid foods that trigger symptoms. Dicyclomine 10 mg 3 times a day when necessary.  3. GERD with intermittent solid food dysphagia. See management in problem 1.

## 2016-11-17 ENCOUNTER — Telehealth: Payer: Self-pay | Admitting: Gastroenterology

## 2016-11-17 MED ORDER — PROMETHAZINE HCL 12.5 MG PO TABS: ORAL_TABLET | ORAL | 1 refills | Status: DC

## 2016-11-17 NOTE — Telephone Encounter (Signed)
Patient reports that zofran 8 mg is working well to control his nausea, but he is having agitation, irritability and severe anxiety.  He held it last night and these symptoms seemed to have resolved some.  He took a dose again this am and all the anxiety, irritability, and agitation returned. Serotonin syndrome is listed as a possible side effect and agitation is a symptom.  He does not take the trazodone that is listed on his medication list. He did not have any problems with these symptoms on the 4 mg dose of Zofran, but that did not work for his nausea.  He reports that his co-workers have remarked to him about a total change in personality in the last few days.  Please advise

## 2016-11-17 NOTE — Telephone Encounter (Signed)
Stop Zofran Try phenergan 6.25 mg or 12.5 mg po tid prn

## 2016-11-17 NOTE — Telephone Encounter (Signed)
Patient notified

## 2016-11-24 ENCOUNTER — Encounter: Payer: BLUE CROSS/BLUE SHIELD | Admitting: Internal Medicine

## 2016-11-24 ENCOUNTER — Ambulatory Visit (INDEPENDENT_AMBULATORY_CARE_PROVIDER_SITE_OTHER): Payer: BLUE CROSS/BLUE SHIELD | Admitting: Family Medicine

## 2016-11-24 ENCOUNTER — Encounter: Payer: Self-pay | Admitting: Family Medicine

## 2016-11-24 VITALS — BP 124/78 | HR 82 | Temp 98.2°F | Ht 69.0 in | Wt 229.0 lb

## 2016-11-24 DIAGNOSIS — Z1159 Encounter for screening for other viral diseases: Secondary | ICD-10-CM

## 2016-11-24 DIAGNOSIS — M8929 Other disorders of bone development and growth, multiple sites: Secondary | ICD-10-CM | POA: Diagnosis not present

## 2016-11-24 DIAGNOSIS — M25511 Pain in right shoulder: Secondary | ICD-10-CM

## 2016-11-24 DIAGNOSIS — M25111 Fistula, right shoulder: Secondary | ICD-10-CM

## 2016-11-24 DIAGNOSIS — Z0001 Encounter for general adult medical examination with abnormal findings: Secondary | ICD-10-CM

## 2016-11-24 DIAGNOSIS — G8929 Other chronic pain: Secondary | ICD-10-CM | POA: Diagnosis not present

## 2016-11-24 DIAGNOSIS — E039 Hypothyroidism, unspecified: Secondary | ICD-10-CM

## 2016-11-24 DIAGNOSIS — Z125 Encounter for screening for malignant neoplasm of prostate: Secondary | ICD-10-CM | POA: Diagnosis not present

## 2016-11-24 DIAGNOSIS — Z0000 Encounter for general adult medical examination without abnormal findings: Secondary | ICD-10-CM

## 2016-11-24 LAB — LIPID PANEL
Cholesterol: 300 mg/dL — ABNORMAL HIGH (ref 0–200)
HDL: 77.1 mg/dL (ref >39.00)
LDL Cholesterol: 209 mg/dL — ABNORMAL HIGH (ref 0–99)
NonHDL: 222.99
Total CHOL/HDL Ratio: 4
Triglycerides: 69 mg/dL (ref 0.0–149.0)
VLDL: 13.8 mg/dL (ref 0.0–40.0)

## 2016-11-24 LAB — CBC
HCT: 48.1 % (ref 39.0–52.0)
Hemoglobin: 16.2 g/dL (ref 13.0–17.0)
MCHC: 33.7 g/dL (ref 30.0–36.0)
MCV: 97.5 fl (ref 78.0–100.0)
Platelets: 163 10*3/uL (ref 150.0–400.0)
RBC: 4.93 Mil/uL (ref 4.22–5.81)
RDW: 14.7 % (ref 11.5–15.5)
WBC: 8.1 10*3/uL (ref 4.0–10.5)

## 2016-11-24 LAB — COMPREHENSIVE METABOLIC PANEL
ALT: 14 U/L (ref 0–53)
AST: 20 U/L (ref 0–37)
Albumin: 4.1 g/dL (ref 3.5–5.2)
Alkaline Phosphatase: 40 U/L (ref 39–117)
BUN: 21 mg/dL (ref 6–23)
CO2: 26 mEq/L (ref 19–32)
Calcium: 9.1 mg/dL (ref 8.4–10.5)
Chloride: 106 mEq/L (ref 96–112)
Creatinine, Ser: 1.14 mg/dL (ref 0.40–1.50)
GFR: 86.2 mL/min (ref >60.00)
Glucose, Bld: 110 mg/dL — ABNORMAL HIGH (ref 70–99)
Potassium: 4 mEq/L (ref 3.5–5.1)
Sodium: 140 mEq/L (ref 135–145)
Total Bilirubin: 0.5 mg/dL (ref 0.2–1.2)
Total Protein: 6.8 g/dL (ref 6.0–8.3)

## 2016-11-24 LAB — TSH: TSH: 0.96 u[IU]/mL (ref 0.35–4.50)

## 2016-11-24 LAB — PSA: PSA: 1.26 ng/mL (ref 0.10–4.00)

## 2016-11-24 LAB — T4, FREE: Free T4: 1.08 ng/dL (ref 0.60–1.60)

## 2016-11-24 NOTE — Progress Notes (Signed)
Chief Complaint  Patient presents with  . Transitions Of Care    pt requesting CPE-non-fasting    Well Male [name] is here for a complete physical.   His last physical was >1 year ago.  Current diet: in general, a "healthy" diet   Current exercise: physically active at work Weight trend: stable Does pt snore? No. Daytime fatigue? No. Seat belt? Yes.    Health maintenance Colonoscopy- Yes Tetanus- No HIV- Yes Hep C- No Prostate cancer screening- No   R shoulder pain Pain and poor ROM, started 6 mo ago. Hx of cluster HA's, was on long steroids that decreased BMD of R shoulder. Has tried heating pad and motrin. Pain is over top of shoulder. He has some decreased range of motion. No numbness or tingling, no weakness. 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 There is no injury or change in activity that preceded this.   Past Medical History:  Diagnosis Date  . BPH (benign prostatic hyperplasia)   . Chest pain, atypical   . Cluster headache    hx of  . Complication of anesthesia    difficult waking up"  . Diverticulosis   . Drug abuse    hx of  . Dyspnea on exertion   . GERD (gastroesophageal reflux disease)   . Hiatal hernia   . Hyperlipidemia   . Hypertension   . Hypothyroidism   . Nontoxic multinodular goiter   . Pulmonary nodule --- CT 10/19/2014: Nodule is stable, no further routine x-rays 01/06/2009  . TIA (transient ischemic attack)   . Trigeminal neuralgia   . Tubular adenoma of colon 12/2015  . Unspecified asthma(493.90)     Past Surgical History:  Procedure Laterality Date  . APPENDECTOMY    . bowel obstruction    . CARDIAC CATHETERIZATION N/A 11/25/2014   Procedure: Left Heart Cath and Coronary Angiography;  Surgeon: Belva Crome, MD;  Location: [facility];  Service: Cardiovascular;  Laterality: N/A;  . CYST EXCISION Left 10/31/2016   Jaw  . DESTRUCTION TRIGEMINAL NERVE VIA NEUROLYTIC AGENT  x 3 , R side last ~2010   . KNEE ARTHROSCOPY Right   . 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 Right Shoulder Redo-Reconstruction  06/21/10   Medications  Current Outpatient Prescriptions on File Prior to Visit  Medication Sig Dispense Refill  . atorvastatin (LIPITOR) 20 MG tablet Take 1 tablet (20 mg total) by mouth at bedtime. 30 tablet 5  . dicyclomine (BENTYL) 20 MG tablet Take 1 tablet (20 mg total) by mouth 3 (three) times daily before meals. 60 tablet 1  . Fructose-Dextrose-Phosphor Acd (NAUSETROL PO) Take 1 tablet by mouth daily as needed.    . lansoprazole (PREVACID) 30 MG capsule Take 1 capsule (30 mg total) by mouth 2 (two) times daily. 60 capsule 5  . loperamide (IMODIUM A-D) 2 MG tablet Take 4 mg by mouth 4 (four) times daily as needed for diarrhea or loose stools.    Marland Kitchen losartan (COZAAR) 100 MG tablet Take 1 tablet (100 mg total) by mouth daily. 30 tablet 3  . promethazine (PHENERGAN) 12.5 MG tablet Take 1/2 -1 tablet by mouth three times a day as needed 90 tablet 1  . promethazine (PHENERGAN) 25 MG tablet TAKE ONE TABLET BY MOUTH EVERY 8 HOURS AS NEEDED FOR FOR NAUSEA AND VOMITING. DO NOT TAKE WITH ZOFRAN 30 tablet 0  . ranitidine (ZANTAC) 150 MG  tablet Take 1 tablet (150 mg total) by mouth at bedtime. 30 tablet 5  . tobramycin (TOBREX) 0.3 % ophthalmic solution Place 1 drop into the right eye every 4 (four) hours. 5 mL 0  . traZODone (DESYREL) 50 MG tablet Take 0.5-1 tablets (25-50 mg total) by mouth at bedtime as needed for sleep. 30 tablet 3   Allergies No Known Allergies Family History Family History  Problem Relation Age of Onset  . Diabetes Mother   . Hyperlipidemia Mother   . Hypertension Mother   . Hypertension Father   . Heart disease Father        CAD/CHF  . Cancer Father        Mesothelioma  . Heart disease Maternal Aunt   . Prostate cancer Neg Hx   . Colon cancer Neg Hx     Review of Systems: Constitutional:  no unexpected change in weight, no  fevers or chills Eye:  no recent significant change in vision Ear/Nose/Mouth/Throat:  Ears:  no tinnitus or hearing loss Nose/Mouth/Throat:  no complaints of nasal congestion or bleeding, no sore throat and oral sores Cardiovascular:  no chest pain, no palpitations Respiratory:  no cough and no shortness of breath Gastrointestinal:  no abdominal pain, no change in bowel habits, no nausea, vomiting, diarrhea, or constipation and no black or bloody stool GU:  Male: negative for dysuria, frequency, and incontinence and negative for prostate symptoms Musculoskeletal/Extremities:  +R shoulder pain as above; otherwise no pain, redness, or swelling of the joints Integumentary (Skin/Breast):  no abnormal skin lesions reported Neurologic:  no headaches, no numbness, tingling Endocrine: No weight changes, masses in the neck, heat/cold intolerance, bowel or skin changes, or cardiovascular system symptoms Hematologic/Lymphatic:  no abnormal bleeding, no HIV risk factors, no night sweats, no swollen nodes, no weight loss  Exam BP 124/78 (BP Location: Left Arm, Patient Position: Sitting, Cuff Size: Large)   Pulse 82   Temp 98.2 F (36.8 C) (Oral)   Ht 5\' 9"  (1.753 m)   Wt 229 lb (103.9 kg)   SpO2 98%   BMI 33.82 kg/m  General:  well developed, well nourished, in no apparent distress Skin:  no significant moles, warts, or growths Head:  no masses, lesions, or tenderness Eyes:  pupils equal and round, sclera anicteric without injection Ears:  canals without lesions, TMs shiny without retraction, no obvious effusion, no erythema Nose:  nares patent, septum midline, mucosa normal Throat/Pharynx:  lips and gingiva without lesion; tongue and uvula midline; non-inflamed pharynx; no exudates or postnasal drainage Neck: neck supple without adenopathy, thyromegaly, or masses Lungs:  clear to auscultation, breath sounds equal bilaterally, no respiratory distress Cardio:  regular rate and rhythm without  murmurs, heart sounds without clicks or rubs Abdomen:  abdomen soft, nontender; bowel sounds normal; no masses or organomegaly Genital (male): Uncircumcised penis, no lesions or discharge; testes present bilaterally without masses or tenderness Rectal: Deferred Musculoskeletal: R shoulder: slightly decreased full abduction, 5/5 strength throughout, +Neer's, Hawkins, Empty can on R, neg Speed's, Cross over, Obrien's, Lift off;  symmetrical muscle groups noted without atrophy or deformity Extremities:  no clubbing, cyanosis, or edema, no deformities, no skin discoloration Neuro:  gait normal; deep tendon reflexes normal and symmetric Psych: well oriented with normal range of affect and appropriate judgment/insight  Assessment and Plan  Well adult exam - Plan: Lipid panel, CBC, Comprehensive metabolic panel  Chronic right shoulder pain  Screening for prostate cancer - Plan: PSA  Need for hepatitis C screening  test - Plan: Hepatitis C antibody  Hypothyroidism, unspecified type - Plan: TSH, T4, free   Well 56 y.o. male. For shoulder, I suspect supraspinatus strain/partial tear; continue heat and anti-inflammatories. We'll give home stretches and exercises. I will see him in 4 weeks for this issue. If he still having issues, will consider injection versus referral to physical therapy. If he starts to worsen or show signs of possible full-thickness tear, will order MRI and refer to orthopedic surgery. Counseled on diet and exercise. Discussed risks and benefits of PSA testing, he wishes to proceed. Immunizations, labs, and further orders as above. Follow up in 6 mo for med check, 1 mo for shoulder re-evaluation. The patient voiced understanding and agreement to the plan.  [place], DO 11/24/16 10:17 AM

## 2016-11-24 NOTE — Patient Instructions (Addendum)
Give [place] 2-3 business days to get the results of your labs back.   Heat (pad or rice pillow in microwave) over affected area, 10-15 minutes every 2-3 hours while awake.   EXERCISES  RANGE OF MOTION (ROM) AND STRETCHING EXERCISES These exercises may help you when beginning to rehabilitate your injury. Your symptoms may resolve with or without further involvement from your physician, physical therapist or athletic trainer. While completing these exercises, remember:   Restoring tissue flexibility helps normal motion to return to the joints. This allows healthier, less painful movement and activity.  An effective stretch should be held for at least 30 seconds.  A stretch should never be painful. You should only feel a gentle lengthening or release in the stretched tissue.  ROM - Pendulum  Bend at the waist so that your right / left arm falls away from your body. Support yourself with your opposite hand on a solid surface, such as a table or a countertop.  Your right / left arm should be perpendicular to the ground. If it is not perpendicular, you need to lean over farther. Relax the muscles in your right / left arm and shoulder as much as possible.  Gently sway your hips and trunk so they move your right / left arm without any use of your right / left shoulder muscles.  Progress your movements so that your right / left arm moves side to side, then forward and backward, and finally, both clockwise and counterclockwise.  Complete 10-15 repetitions in each direction. Many people use this exercise to relieve discomfort in their shoulder as well as to gain range of motion. Repeat 2-3 times. Complete this exercise once per day.  STRETCH - Flexion, Standing  Stand with good posture. With an underhand grip on your right / left hand and an overhand grip on the opposite hand, grasp a broomstick or cane so that your hands are a little more than shoulder-width apart.  Keeping your right / left elbow  straight and shoulder muscles relaxed, push the stick with your opposite hand to raise your right / left arm in front of your body and then overhead. Raise your arm until you feel a stretch in your right / left shoulder, but before you have increased shoulder pain.  Try to avoid shrugging your right / left shoulder as your arm rises by keeping your shoulder blade tucked down and toward your mid-back spine. Hold 15-20 seconds.  Slowly return to the starting position. Repeat 2-3 times. Complete this exercise once per day.  STRETCH - Internal Rotation  Place your right / left hand behind your back, palm-up.  Throw a towel or belt over your opposite shoulder. Grasp the towel/belt with your right / left hand.  While keeping an upright posture, gently pull up on the towel/belt until you feel a stretch in the front of your right / left shoulder.  Avoid shrugging your right / left shoulder as your arm rises by keeping your shoulder blade tucked down and toward your mid-back spine.  Hold 15-20. Release the stretch by lowering your opposite hand. Repeat 2-3 times. Complete this exercise once per day.  STRETCH - External Rotation and Abduction  Stagger your stance through a doorframe. It does not matter which foot is forward.  As instructed by your physician, physical therapist or athletic trainer, place your hands: ? And forearms above your head and on the door frame. ? And forearms at head-height and on the door frame. ? At elbow-height  and on the door frame.  Keeping your head and chest upright and your stomach muscles tight to prevent over-extending your low-back, slowly shift your weight onto your front foot until you feel a stretch across your chest and/or in the front of your shoulders.  Hold 15-20 seconds. Shift your weight to your back foot to release the stretch. Repeat 2-3 times. Complete this stretch once per day.   STRENGTHENING EXERCISES  These exercises may help you when  beginning to rehabilitate your injury. They may resolve your symptoms with or without further involvement from your physician, physical therapist or athletic trainer. While completing these exercises, remember:   Muscles can gain both the endurance and the strength needed for everyday activities through controlled exercises.  Complete these exercises as instructed by your physician, physical therapist or athletic trainer. Progress the resistance and repetitions only as guided.  You may experience muscle soreness or fatigue, but the pain or discomfort you are trying to eliminate should never worsen during these exercises. If this pain does worsen, stop and make certain you are following the directions exactly. If the pain is still present after adjustments, discontinue the exercise until you can discuss the trouble with your clinician.  If advised by your physician, during your recovery, avoid activity or exercises which involve actions that place your right / left hand or elbow above your head or behind your back or head. These positions stress the tissues which are trying to heal.  STRENGTH - Scapular Depression and Adduction  With good posture, sit on a firm chair. Supported your arms in front of you with pillows, arm rests or a table top. Have your elbows in line with the sides of your body.  Gently draw your shoulder blades down and toward your mid-back spine. Gradually increase the tension without tensing the muscles along the top of your shoulders and the back of your neck.  Hold for 5 seconds. Slowly release the tension and relax your muscles completely before completing the next repetition.  After you have practiced this exercise, remove the arm support and complete it in standing as well as sitting. Repeat 2 times. Complete this exercise every other day.   STRENGTH - External Rotators  Secure a rubber exercise band/tubing to a fixed object so that it is at the same height as your  right / left elbow when you are standing or sitting on a firm surface.  Stand or sit so that the secured exercise band/tubing is at your side that is not injured.  Bend your elbow 90 degrees. Place a folded towel or small pillow under your right / left arm so that your elbow is a few inches away from your side.  Keeping the tension on the exercise band/tubing, pull it away from your body, as if pivoting on your elbow. Be sure to keep your body steady so that the movement is only coming from your shoulder rotating.  Hold 5 seconds. Release the tension in a controlled manner as you return to the starting position. Repeat 2 times. Complete this exercise every other day.   STRENGTH - Supraspinatus  Stand or sit with good posture. Grasp a 2-3 lb weight or an exercise band/tubing so that your hand is "thumbs-up," like when you shake hands.  Slowly lift your right / left hand from your thigh into the air, traveling about 30 degrees from straight out at your side. Lift your hand to shoulder height or as far as you can without increasing any  shoulder pain. Initially, many people do not lift their hands above shoulder height.  Avoid shrugging your right / left shoulder as your arm rises by keeping your shoulder blade tucked down and toward your mid-back spine.  Hold for 4-5 seconds. Control the descent of your hand as you slowly return to your starting position. Repeat 2 times. Complete this exercise every other day.   STRENGTH - Shoulder Extensors  Secure a rubber exercise band/tubing so that it is at the height of your shoulders when you are either standing or sitting on a firm arm-less chair.  With a thumbs-up grip, grasp an end of the band/tubing in each hand. Straighten your elbows and lift your hands straight in front of you at shoulder height. Step back away from the secured end of band/tubing until it becomes tense.  Squeezing your shoulder blades together, pull your hands down to the sides  of your thighs. Do not allow your hands to go behind you.  Hold for 5 seconds. Slowly ease the tension on the band/tubing as you reverse the directions and return to the starting position. Repeat 2 times. Complete this exercise every other day.   STRENGTH - Scapular Retractors  Secure a rubber exercise band/tubing so that it is at the height of your shoulders when you are either standing or sitting on a firm arm-less chair.  With a palm-down grip, grasp an end of the band/tubing in each hand. Straighten your elbows and lift your hands straight in front of you at shoulder height. Step back away from the secured end of band/tubing until it becomes tense.  Squeezing your shoulder blades together, draw your elbows back as you bend them. Keep your upper arm lifted away from your body throughout the exercise.  Hold 5 seconds. Slowly ease the tension on the band/tubing as you reverse the directions and return to the starting position. Repeat 2 times. Complete this exercise every other day.  STRENGTH - Scapular Depressors  Find a sturdy chair without wheels, such as a from a dining room table.  Keeping your feet on the floor, lift your bottom from the seat and lock your elbows.  Keeping your elbows straight, allow gravity to pull your body weight down. Your shoulders will rise toward your ears.  Raise your body against gravity by drawing your shoulder blades down your back, shortening the distance between your shoulders and ears. Although your feet should always maintain contact with the floor, your feet should progressively support less body weight as you get stronger.  Hold 5 seconds. In a controlled and slow manner, lower your body weight to begin the next repetition. Repeat 2 times. Complete this exercise every other day.   This information is not intended to replace advice given to you by your health care provider. Make sure you discuss any questions you have with your health care  provider.  Document Released: 02/22/2005 Document Revised: 05/01/2014 Document Reviewed: 07/23/2008 [employer]vier Interactive Patient Education [employer].

## 2016-11-25 LAB — HEPATITIS C ANTIBODY: HCV Ab: NONREACTIVE

## 2016-11-26 ENCOUNTER — Emergency Department (HOSPITAL_BASED_OUTPATIENT_CLINIC_OR_DEPARTMENT_OTHER): Payer: BLUE CROSS/BLUE SHIELD

## 2016-11-26 ENCOUNTER — Inpatient Hospital Stay (HOSPITAL_BASED_OUTPATIENT_CLINIC_OR_DEPARTMENT_OTHER)
Admission: EM | Admit: 2016-11-26 | Discharge: 2016-11-30 | DRG: 246 | Disposition: A | Discharge status: Home / Self Care | Payer: BLUE CROSS/BLUE SHIELD | Attending: Cardiovascular Disease | Admitting: Cardiovascular Disease

## 2016-11-26 ENCOUNTER — Encounter (HOSPITAL_BASED_OUTPATIENT_CLINIC_OR_DEPARTMENT_OTHER): Payer: Self-pay | Admitting: Emergency Medicine

## 2016-11-26 DIAGNOSIS — I25110 Atherosclerotic heart disease of native coronary artery with unstable angina pectoris: Secondary | ICD-10-CM | POA: Diagnosis present

## 2016-11-26 DIAGNOSIS — F17210 Nicotine dependence, cigarettes, uncomplicated: Secondary | ICD-10-CM | POA: Diagnosis present

## 2016-11-26 DIAGNOSIS — Z7982 Long term (current) use of aspirin: Secondary | ICD-10-CM | POA: Diagnosis not present

## 2016-11-26 DIAGNOSIS — E039 Hypothyroidism, unspecified: Secondary | ICD-10-CM | POA: Diagnosis present

## 2016-11-26 DIAGNOSIS — I110 Hypertensive heart disease with heart failure: Secondary | ICD-10-CM | POA: Diagnosis present

## 2016-11-26 DIAGNOSIS — N400 Enlarged prostate without lower urinary tract symptoms: Secondary | ICD-10-CM | POA: Diagnosis present

## 2016-11-26 DIAGNOSIS — Z8673 Personal history of transient ischemic attack (TIA), and cerebral infarction without residual deficits: Secondary | ICD-10-CM

## 2016-11-26 DIAGNOSIS — I214 Non-ST elevation (NSTEMI) myocardial infarction: Principal | ICD-10-CM | POA: Diagnosis present

## 2016-11-26 DIAGNOSIS — E785 Hyperlipidemia, unspecified: Secondary | ICD-10-CM | POA: Diagnosis not present

## 2016-11-26 DIAGNOSIS — E7800 Pure hypercholesterolemia, unspecified: Secondary | ICD-10-CM | POA: Diagnosis present

## 2016-11-26 DIAGNOSIS — J45909 Unspecified asthma, uncomplicated: Secondary | ICD-10-CM | POA: Diagnosis present

## 2016-11-26 DIAGNOSIS — Z720 Tobacco use: Secondary | ICD-10-CM | POA: Diagnosis not present

## 2016-11-26 DIAGNOSIS — Z955 Presence of coronary angioplasty implant and graft: Secondary | ICD-10-CM

## 2016-11-26 DIAGNOSIS — I10 Essential (primary) hypertension: Secondary | ICD-10-CM | POA: Diagnosis not present

## 2016-11-26 DIAGNOSIS — I5031 Acute diastolic (congestive) heart failure: Secondary | ICD-10-CM | POA: Diagnosis present

## 2016-11-26 DIAGNOSIS — K219 Gastro-esophageal reflux disease without esophagitis: Secondary | ICD-10-CM | POA: Diagnosis present

## 2016-11-26 DIAGNOSIS — Z79899 Other long term (current) drug therapy: Secondary | ICD-10-CM | POA: Diagnosis not present

## 2016-11-26 DIAGNOSIS — G500 Trigeminal neuralgia: Secondary | ICD-10-CM | POA: Diagnosis present

## 2016-11-26 DIAGNOSIS — I5032 Chronic diastolic (congestive) heart failure: Secondary | ICD-10-CM | POA: Diagnosis not present

## 2016-11-26 DIAGNOSIS — R079 Chest pain, unspecified: Secondary | ICD-10-CM | POA: Diagnosis present

## 2016-11-26 HISTORY — DX: Non-ST elevation (NSTEMI) myocardial infarction: I21.4

## 2016-11-26 HISTORY — DX: Atherosclerotic heart disease of native coronary artery without angina pectoris: I25.10

## 2016-11-26 LAB — BASIC METABOLIC PANEL
Anion gap: 10 (ref 5–15)
BUN: 17 mg/dL (ref 6–20)
CO2: 23 mmol/L (ref 22–32)
Calcium: 9.1 mg/dL (ref 8.9–10.3)
Chloride: 104 mmol/L (ref 101–111)
Creatinine, Ser: 1.2 mg/dL (ref 0.61–1.24)
GFR calc Af Amer: >60 mL/min (ref >60)
GFR calc non Af Amer: >60 mL/min (ref >60)
Glucose, Bld: 116 mg/dL — ABNORMAL HIGH (ref 65–99)
Potassium: 3.7 mmol/L (ref 3.5–5.1)
Sodium: 137 mmol/L (ref 135–145)

## 2016-11-26 LAB — CBC
HCT: 45.2 % (ref 39.0–52.0)
Hemoglobin: 15.8 g/dL (ref 13.0–17.0)
MCH: 32.3 pg (ref 26.0–34.0)
MCHC: 35 g/dL (ref 30.0–36.0)
MCV: 92.4 fL (ref 78.0–100.0)
Platelets: 167 10*3/uL (ref 150–400)
RBC: 4.89 MIL/uL (ref 4.22–5.81)
RDW: 13.2 % (ref 11.5–15.5)
WBC: 9.8 10*3/uL (ref 4.0–10.5)

## 2016-11-26 LAB — RAPID URINE DRUG SCREEN, HOSP PERFORMED
Amphetamines: NOT DETECTED
Barbiturates: NOT DETECTED
Benzodiazepines: NOT DETECTED
Cocaine: NOT DETECTED
Opiates: NOT DETECTED
Tetrahydrocannabinol: NOT DETECTED

## 2016-11-26 LAB — TROPONIN I: Troponin I: 2.78 ng/mL (ref <0.03)

## 2016-11-26 MED ORDER — FENTANYL CITRATE (PF) 100 MCG/2ML IJ SOLN
75.0000 ug | Freq: Once | INTRAMUSCULAR | Status: AC
  Administered 2016-11-26: 75 ug via INTRAVENOUS
  Filled 2016-11-26: qty 2

## 2016-11-26 MED ORDER — FENTANYL CITRATE (PF) 100 MCG/2ML IJ SOLN
50.0000 ug | Freq: Once | INTRAMUSCULAR | Status: AC
Start: 2016-11-26 — End: 2016-11-26
  Administered 2016-11-26: 50 ug via INTRAVENOUS
  Filled 2016-11-26: qty 2

## 2016-11-26 MED ORDER — ONDANSETRON HCL 4 MG/2ML IJ SOLN
4.0000 mg | Freq: Once | INTRAMUSCULAR | Status: AC
Start: 2016-11-26 — End: 2016-11-26
  Administered 2016-11-26: 4 mg via INTRAVENOUS
  Filled 2016-11-26: qty 2

## 2016-11-26 MED ORDER — ASPIRIN 81 MG PO CHEW
CHEWABLE_TABLET | ORAL | Status: AC
  Administered 2016-11-26: 324 mg
  Filled 2016-11-26: qty 4

## 2016-11-26 MED ORDER — HEPARIN BOLUS VIA INFUSION
4000.0000 [IU] | Freq: Once | INTRAVENOUS | Status: AC
  Administered 2016-11-26: 4000 [IU] via INTRAVENOUS

## 2016-11-26 MED ORDER — HEPARIN (PORCINE) IN NACL 100-0.45 UNIT/ML-% IJ SOLN
1300.0000 [IU]/h | INTRAMUSCULAR | Status: DC
  Administered 2016-11-26: 1300 [IU]/h via INTRAVENOUS
  Filled 2016-11-26: qty 250

## 2016-11-26 MED ORDER — FENTANYL CITRATE (PF) 100 MCG/2ML IJ SOLN
50.0000 ug | Freq: Once | INTRAMUSCULAR | Status: AC
  Administered 2016-11-26: 50 ug via INTRAVENOUS
  Filled 2016-11-26: qty 2

## 2016-11-26 MED ORDER — IOPAMIDOL (ISOVUE-370) INJECTION 76%
100.0000 mL | Freq: Once | INTRAVENOUS | Status: AC | PRN
  Administered 2016-11-26: 100 mL via INTRAVENOUS

## 2016-11-26 MED ORDER — NITROGLYCERIN 0.4 MG SL SUBL
0.4000 mg | SUBLINGUAL_TABLET | SUBLINGUAL | Status: AC | PRN
  Administered 2016-11-26 – 2016-11-27 (×3): 0.4 mg via SUBLINGUAL
  Filled 2016-11-26 (×2): qty 1

## 2016-11-26 NOTE — ED Provider Notes (Signed)
[facility] Provider Note   CSN: [id number] Arrival date & time: 11/26/16  7672  By signing my name below, I, Dora Sims, attest that this documentation has been prepared under the direction and in the presence of physician practitioner, Cardama, Grayce Sessions, MD. Electronically Signed: Dora Sims, Scribe. 11/26/2016. 8:36 PM.  History   Chief Complaint Chief Complaint  Patient presents with  . Chest Pain   The history is provided by the patient. No language interpreter was used.    HPI Comments: Silviano Neuser is a 56 y.o. male with PMHx including HTN, HLD, CAD, and GERD who presents to the Emergency Department complaining of persistent left-sided chest pain radiating into the left upper extremity beginning this morning upon waking. He states that it feels like someone struck him in the chest. No recent trauma or injury to his chest. Patient attempted to work and mow the grass today which worsened his chest pain. He reports associated DOE and some mild nausea without vomiting, but also notes that he is regularly nauseous. Patient took baby aspirin x1 around 8 AM this morning without improvement. No h/o MI, DM, cancer or blood clots. No FMHx of MI at a young age. No h/o cardiac stress test. He smokes ~3 cigarettes per week and drinks alcohol sometimes. He does not use illicit drugs. No recent immobilizations or surgeries. No hormone therapy. Patient had a physical exam at his PCP's office two days ago which was unremarkable. He denies abdominal pain, leg swelling, or any other associated symptoms.  Past Medical History:  Diagnosis Date  . BPH (benign prostatic hyperplasia)   . Chest pain, atypical   . Cluster headache    hx of  . Complication of anesthesia    difficult waking up"  . Diverticulosis   . Drug abuse    hx of  . Dyspnea on exertion   . GERD (gastroesophageal reflux disease)   . Hiatal hernia   . Hyperlipidemia   . Hypertension   . Hypothyroidism     . Nontoxic multinodular goiter   . Pulmonary nodule --- CT 10/19/2014: Nodule is stable, no further routine x-rays 01/06/2009  . TIA (transient ischemic attack)   . Trigeminal neuralgia   . Tubular adenoma of colon 12/2015  . Unspecified asthma(493.90)     Patient Active Problem List   Diagnosis Date Noted  . NSTEMI (non-ST elevated myocardial infarction) ([place]) 11/26/2016  . Intractable nausea and vomiting 10/09/2016  . Nausea vomiting and diarrhea 10/09/2016  . Enteritis due to Clostridium difficile 10/30/2015  . Malnutrition of moderate degree 10/21/2015  . Acute kidney injury ([place]) 10/20/2015  . PCP NOTES >>>>>>>>>>>>>>>>> 09/30/2015  . HLD (hyperlipidemia) 09/22/2015  . Renal mass, left 11/02/2014  . CAD (coronary artery disease)   . Drug abuse and dependence ([place]) 09/18/2014  . Visit for preventive health examination 02/04/2014  . Screening for ischemic heart disease 02/04/2014  . BPH associated with nocturia 12/24/2013  . Hypothyroidism 12/24/2013  . Chronic ethmoidal sinusitis 12/24/2013  . Low back pain radiating to right leg 05/10/2012  . Thrombocytopenia ([place]) 12/05/2011  . Tobacco abuse 11/30/2011  . Anxiety 12/26/2010  . Transient cerebral ischemia 01/07/2010  . Asthma 01/19/2009  . Pulmonary nodule --- CT 10/19/2014: Nodule is stable, no further routine x-rays 01/06/2009  . Nontoxic multinodular goiter 12/31/2008  . DYSPNEA ON EXERTION 12/15/2008  . Trigeminal neuralgia 07/24/2007  . Dyslipidemia 12/27/2006  . Cluster headache 12/27/2006  . Essential hypertension 12/27/2006  . GERD 12/27/2006  Past Surgical History:  Procedure Laterality Date  . APPENDECTOMY    . bowel obstruction    . CARDIAC CATHETERIZATION N/A 11/25/2014   Procedure: Left Heart Cath and Coronary Angiography;  Surgeon: Belva Crome, MD;  Location: [facility];  Service: Cardiovascular;  Laterality: N/A;  . CYST EXCISION Left 10/31/2016   Jaw  . DESTRUCTION TRIGEMINAL NERVE VIA  NEUROLYTIC AGENT  x 3 , R side last ~2010  . KNEE ARTHROSCOPY Right   . 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 Right Shoulder Redo-Reconstruction  06/21/10       Home Medications    Prior to Admission medications   Medication Sig Start Date End Date Taking? Authorizing Provider  aspirin EC 81 MG tablet Take 81 mg by mouth daily.    [provider]  atorvastatin (LIPITOR) 20 MG tablet Take 1 tablet (20 mg total) by mouth at bedtime. 09/05/16   Debbrah Alar, NP  Cetirizine-Pseudoephedrine (ZYRTEC-D PO) Take 1 tablet by mouth daily.    [provider]  chlorhexidine (PERIDEX) 0.12 % solution As directed. 10/23/16   [provider]  dicyclomine (BENTYL) 20 MG tablet Take 1 tablet (20 mg total) by mouth 3 (three) times daily before meals. 10/13/16   [name], DO  Fructose-Dextrose-Phosphor Acd (NAUSETROL PO) Take 1 tablet by mouth daily as needed.    [provider]  lansoprazole (PREVACID) 30 MG capsule Take 1 capsule (30 mg total) by mouth 2 (two) times daily. 11/13/16   Ladene Artist, MD  loperamide (IMODIUM A-D) 2 MG tablet Take 4 mg by mouth 4 (four) times daily as needed for diarrhea or loose stools.    [provider]  losartan (COZAAR) 100 MG tablet Take 1 tablet (100 mg total) by mouth daily. 09/15/16   Colon Branch, MD  ondansetron (ZOFRAN) 8 MG tablet Take by mouth 2 (two) times daily.    [provider]  promethazine (PHENERGAN) 12.5 MG tablet Take 1/2 -1 tablet by mouth three times a day as needed 11/17/16   Ladene Artist, MD  promethazine (PHENERGAN) 25 MG tablet TAKE ONE TABLET BY MOUTH EVERY 8 HOURS AS NEEDED FOR FOR NAUSEA AND VOMITING. DO NOT TAKE WITH ZOFRAN 11/06/16   [name], DO  ranitidine (ZANTAC) 150 MG tablet Take 1 tablet (150 mg total) by mouth at bedtime. 11/13/16 11/13/17  Ladene Artist, MD  tobramycin (TOBREX) 0.3 % ophthalmic solution Place 1  drop into the right eye every 4 (four) hours. 10/13/16   [name], DO  tobramycin (TOBREX) 0.3 % ophthalmic solution Apply to eye. Apply 1 drop to eye as needed. 10/13/16   [provider]  traZODone (DESYREL) 50 MG tablet Take 0.5-1 tablets (25-50 mg total) by mouth at bedtime as needed for sleep. 09/05/16   Debbrah Alar, NP    Family History Family History  Problem Relation Age of Onset  . Diabetes Mother   . Hyperlipidemia Mother   . Hypertension Mother   . Hypertension Father   . Heart disease Father        CAD/CHF  . Cancer Father        Mesothelioma  . Heart disease Maternal Aunt   . Prostate cancer Neg Hx   . Colon cancer Neg Hx     Social History Social History  Substance Use Topics  . Smoking status: Current Some Day Smoker    Packs/day: 0.25  Years: 18.00    Types: Cigarettes  . Smokeless tobacco: Never Used     Comment: smokes about 1 cigarette daily  . Alcohol use Yes     Comment: rare      Allergies   Patient has no known allergies.   Review of Systems Review of Systems All other systems reviewed and are negative for acute change except as noted in the HPI. Physical Exam Updated Vital Signs BP (!) 157/102   Pulse 66   Temp 97.9 F (36.6 C) (Oral)   Resp 20   Ht 5\' 9"  (1.753 m)   Wt 103.9 kg (229 lb)   SpO2 97%   BMI 33.82 kg/m   Physical Exam  Constitutional: He is oriented to person, place, and time. He appears well-developed and well-nourished. No distress.  HENT:  Head: Normocephalic and atraumatic.  Nose: Nose normal.  Eyes: Pupils are equal, round, and reactive to light. Conjunctivae and EOM are normal. Right eye exhibits no discharge. Left eye exhibits no discharge. No scleral icterus.  Neck: Normal range of motion. Neck supple.  Cardiovascular: Normal rate and regular rhythm.  Exam reveals no gallop and no friction rub.   No murmur heard. Pulses:      Radial pulses are 2+ on the right side, and 2+ on  the left side.  Pulmonary/Chest: Effort normal and breath sounds normal. No stridor. No respiratory distress. He has no rales.  Abdominal: Soft. He exhibits no distension. There is no tenderness.  Musculoskeletal: He exhibits no edema or tenderness.  Neurological: He is alert and oriented to person, place, and time.  Skin: Skin is warm and dry. No rash noted. He is not diaphoretic. No erythema.  Psychiatric: He has a normal mood and affect.  Vitals reviewed.   ED Treatments / Results  Labs (all labs ordered are listed, but only abnormal results are displayed) Labs Reviewed  BASIC METABOLIC PANEL - Abnormal; Notable for the following:       Result Value   Glucose, Bld 116 (*)    All other components within normal limits  TROPONIN I - Abnormal; Notable for the following:    Troponin I 2.78 (*)    All other components within normal limits  CBC  RAPID URINE DRUG SCREEN, HOSP PERFORMED  HEPARIN LEVEL (UNFRACTIONATED)    EKG  EKG Interpretation  Date/Time:  Sunday November 26 2016 18:13:52 EDT Ventricular Rate:  91 PR Interval:  158 QRS Duration: 84 QT Interval:  342 QTC Calculation: 420 R Axis:   86 Text Interpretation:  Normal sinus rhythm Possible Left atrial enlargement Nonspecific ST and T wave abnormality Abnormal ECG NO STEMI Otherwise no significant change Confirmed by Addison Lank [id number]) on 11/26/2016 7:59:06 PM       Radiology Dg Chest 2 View  Result Date: 11/26/2016 CLINICAL DATA:  Acute onset of chest pain since this morning beneath left arm. Dizziness and hypertension. Smoker. EXAM: CHEST  2 VIEW COMPARISON:  10/29/2015 CXR, 10/19/2014 CT FINDINGS: The heart size and mediastinal contours are within normal limits. Both lungs are clear. Nodular density seen on remote CT is not radiographically apparent. The visualized skeletal structures are unremarkable with partially included right shoulder arthroplasty noted. IMPRESSION: No active cardiopulmonary disease.  Electronically Signed   By: Ashley Royalty M.D.   On: 11/26/2016 19:24   Ct Angio Chest Aorta W And/or Wo Contrast  Result Date: 11/26/2016 CLINICAL DATA:  Acute onset of left-sided chest pain. Nausea and belching. Initial encounter. EXAM: CT  ANGIOGRAPHY CHEST, ABDOMEN AND PELVIS TECHNIQUE: Multidetector CT imaging through the chest, abdomen and pelvis was performed using the standard protocol during bolus administration of intravenous contrast. Multiplanar reconstructed images and MIPs were obtained and reviewed to evaluate the vascular anatomy. CONTRAST:  100 mL of Isovue 370 IV contrast COMPARISON:  Chest radiograph performed earlier today at 7:01 p.m., and CTA of the chest performed 10/19/2014 FINDINGS: CTA CHEST FINDINGS Cardiovascular: There is no evidence of aortic dissection. There is no evidence of aneurysmal dilatation. No calcific atherosclerotic disease is seen along the thoracic aorta. The great vessels are grossly unremarkable in appearance. There is no evidence of significant pulmonary embolus. The heart remains normal in size. Scattered coronary artery calcifications are seen. Mediastinum/Nodes: The mediastinum is unremarkable in appearance. No mediastinal lymphadenopathy is seen. No pericardial effusion is identified. A 3.7 cm left thyroid hypodensity is noted. A 1.6 cm right thyroid hypodensity is seen. These appear relatively stable from studies dating back to 2012, and are likely benign. No axillary lymphadenopathy is appreciated. Lungs/Pleura: Minimal bilateral dependent subsegmental atelectasis is noted. No pleural effusion or pneumothorax is seen. No masses are identified. Musculoskeletal: No acute osseous abnormalities are identified. The patient's right shoulder arthroplasty is incompletely imaged, but appears grossly unremarkable. The visualized musculature is unremarkable in appearance. Review of the MIP images confirms the above findings. CTA ABDOMEN AND PELVIS FINDINGS VASCULAR Aorta:  There is no evidence of aortic dissection. There is no evidence of aneurysmal dilatation. Mild scattered calcification is seen along the abdominal aorta and its branches. Celiac: The celiac trunk appears fully patent. SMA: The superior mesenteric artery remains fully patent. Renals: The renal arteries appear patent bilaterally. Two right-sided renal arteries are seen. IMA: The inferior mesenteric artery remains patent. Inflow: Scattered calcification is noted along the common and internal iliac arteries bilaterally, and along the common femoral arteries bilaterally. Veins: Visualized venous structures are grossly unremarkable, though difficult to fully assess given the phase of contrast enhancement. Review of the MIP images confirms the above findings. NON-VASCULAR Hepatobiliary: The liver is unremarkable in appearance. The gallbladder is unremarkable in appearance. The common bile duct remains normal in caliber. Pancreas: The pancreas is within normal limits. Spleen: The spleen is unremarkable in appearance. Adrenals/Urinary Tract: The adrenal glands are unremarkable in appearance. Scattered left renal cysts are seen. There is no evidence of hydronephrosis. No renal or ureteral stones are identified. No perinephric stranding is seen. A 1.5 cm heterogeneous nodule at the lateral aspect of the left kidney is grossly stable from 2013 and likely benign. Stomach/Bowel: The stomach is unremarkable in appearance. The small bowel is within normal limits. The appendix is normal in caliber, without evidence of appendicitis. Mild scattered diverticulosis is noted along the descending colon, without evidence of diverticulitis. Lymphatic: No retroperitoneal or pelvic sidewall lymphadenopathy is seen. Reproductive: The bladder is decompressed and not well assessed. The prostate remains normal in size. Other: No additional soft tissue abnormalities are seen. Musculoskeletal: No acute osseous abnormalities are identified. The  visualized musculature is unremarkable in appearance. Review of the MIP images confirms the above findings. IMPRESSION: 1. No evidence of aortic dissection. No evidence of aneurysmal dilatation. Mild scattered aortic atherosclerosis. 2. No evidence of significant pulmonary embolus. 3. Stable thyroid hypodensities are likely benign. 4. Minimal bilateral dependent subsegmental atelectasis noted. Lungs otherwise clear. 5. Scattered left renal cysts. 6. 1.5 cm heterogeneous nodule at the lateral aspect of the left kidney is grossly stable from 2013 and likely benign. 7. Mild diverticulosis along the  descending colon, without evidence of diverticulitis. Electronically Signed   By: Garald Balding M.D.   On: 11/26/2016 21:54   Ct Angio Abd/pel W And/or Wo Contrast  Result Date: 11/26/2016 CLINICAL DATA:  Acute onset of left-sided chest pain. Nausea and belching. Initial encounter. EXAM: CT ANGIOGRAPHY CHEST, ABDOMEN AND PELVIS TECHNIQUE: Multidetector CT imaging through the chest, abdomen and pelvis was performed using the standard protocol during bolus administration of intravenous contrast. Multiplanar reconstructed images and MIPs were obtained and reviewed to evaluate the vascular anatomy. CONTRAST:  100 mL of Isovue 370 IV contrast COMPARISON:  Chest radiograph performed earlier today at 7:01 p.m., and CTA of the chest performed 10/19/2014 FINDINGS: CTA CHEST FINDINGS Cardiovascular: There is no evidence of aortic dissection. There is no evidence of aneurysmal dilatation. No calcific atherosclerotic disease is seen along the thoracic aorta. The great vessels are grossly unremarkable in appearance. There is no evidence of significant pulmonary embolus. The heart remains normal in size. Scattered coronary artery calcifications are seen. Mediastinum/Nodes: The mediastinum is unremarkable in appearance. No mediastinal lymphadenopathy is seen. No pericardial effusion is identified. A 3.7 cm left thyroid hypodensity is  noted. A 1.6 cm right thyroid hypodensity is seen. These appear relatively stable from studies dating back to 2012, and are likely benign. No axillary lymphadenopathy is appreciated. Lungs/Pleura: Minimal bilateral dependent subsegmental atelectasis is noted. No pleural effusion or pneumothorax is seen. No masses are identified. Musculoskeletal: No acute osseous abnormalities are identified. The patient's right shoulder arthroplasty is incompletely imaged, but appears grossly unremarkable. The visualized musculature is unremarkable in appearance. Review of the MIP images confirms the above findings. CTA ABDOMEN AND PELVIS FINDINGS VASCULAR Aorta: There is no evidence of aortic dissection. There is no evidence of aneurysmal dilatation. Mild scattered calcification is seen along the abdominal aorta and its branches. Celiac: The celiac trunk appears fully patent. SMA: The superior mesenteric artery remains fully patent. Renals: The renal arteries appear patent bilaterally. Two right-sided renal arteries are seen. IMA: The inferior mesenteric artery remains patent. Inflow: Scattered calcification is noted along the common and internal iliac arteries bilaterally, and along the common femoral arteries bilaterally. Veins: Visualized venous structures are grossly unremarkable, though difficult to fully assess given the phase of contrast enhancement. Review of the MIP images confirms the above findings. NON-VASCULAR Hepatobiliary: The liver is unremarkable in appearance. The gallbladder is unremarkable in appearance. The common bile duct remains normal in caliber. Pancreas: The pancreas is within normal limits. Spleen: The spleen is unremarkable in appearance. Adrenals/Urinary Tract: The adrenal glands are unremarkable in appearance. Scattered left renal cysts are seen. There is no evidence of hydronephrosis. No renal or ureteral stones are identified. No perinephric stranding is seen. A 1.5 cm heterogeneous nodule at the  lateral aspect of the left kidney is grossly stable from 2013 and likely benign. Stomach/Bowel: The stomach is unremarkable in appearance. The small bowel is within normal limits. The appendix is normal in caliber, without evidence of appendicitis. Mild scattered diverticulosis is noted along the descending colon, without evidence of diverticulitis. Lymphatic: No retroperitoneal or pelvic sidewall lymphadenopathy is seen. Reproductive: The bladder is decompressed and not well assessed. The prostate remains normal in size. Other: No additional soft tissue abnormalities are seen. Musculoskeletal: No acute osseous abnormalities are identified. The visualized musculature is unremarkable in appearance. Review of the MIP images confirms the above findings. IMPRESSION: 1. No evidence of aortic dissection. No evidence of aneurysmal dilatation. Mild scattered aortic atherosclerosis. 2. No evidence of significant pulmonary  embolus. 3. Stable thyroid hypodensities are likely benign. 4. Minimal bilateral dependent subsegmental atelectasis noted. Lungs otherwise clear. 5. Scattered left renal cysts. 6. 1.5 cm heterogeneous nodule at the lateral aspect of the left kidney is grossly stable from 2013 and likely benign. 7. Mild diverticulosis along the descending colon, without evidence of diverticulitis. Electronically Signed   By: Garald Balding M.D.   On: 11/26/2016 21:54    Procedures Procedures (including critical care time)  CRITICAL CARE Performed by: Grayce Sessions Cardama Total critical care time: 40 minutes Critical care time was exclusive of separately billable procedures and treating other patients. Critical care was necessary to treat or prevent imminent or life-threatening deterioration. Critical care was time spent personally by me on the following activities: development of treatment plan with patient and/or surrogate as well as nursing, discussions with consultants, evaluation of patient's response to  treatment, examination of patient, obtaining history from patient or surrogate, ordering and performing treatments and interventions, ordering and review of laboratory studies, ordering and review of radiographic studies, pulse oximetry and re-evaluation of patient's condition.   DIAGNOSTIC STUDIES: Oxygen Saturation is 98% on RA, normal by my interpretation.    COORDINATION OF CARE: 8:33 PM Discussed treatment plan with pt at bedside and pt agreed to plan.  Medications Ordered in ED Medications  nitroGLYCERIN (NITROSTAT) SL tablet 0.4 mg (0.4 mg Sublingual Given 11/26/16 2056)  heparin ADULT infusion 100 units/mL (25000 units/260mL sodium chloride 0.45%) (1,300 Units/hr Intravenous Transfusing/Transfer 11/26/16 2216)  ondansetron (ZOFRAN) injection 4 mg (not administered)  fentaNYL (SUBLIMAZE) injection 50 mcg (not administered)  aspirin 81 MG chewable tablet (324 mg  Given 11/26/16 2032)  fentaNYL (SUBLIMAZE) injection 75 mcg (75 mcg Intravenous Given 11/26/16 2109)  iopamidol (ISOVUE-370) 76 % injection 100 mL (100 mLs Intravenous Contrast Given 11/26/16 2122)  heparin bolus via infusion 4,000 Units (4,000 Units Intravenous Bolus from Bag 11/26/16 2201)  fentaNYL (SUBLIMAZE) injection 50 mcg (50 mcg Intravenous Given 11/26/16 2211)     Initial Impression / Assessment and Plan / ED Course  I have reviewed the triage vital signs and the nursing notes.  Pertinent labs & imaging results that were available during my care of the patient were reviewed by me and considered in my medical decision making (see chart for details).     EKG with ST depression in the septal leads. With elevated troponin, this is concerning for non-ST segment elevation MI. Patient is noted to have elevated blood pressure and reported that nitroglycerin worsened his pain, CTA obtained to rule out dissection which was negative. Also negative for pulmonary embolism or other respiratory/lung processes. Patient provided with aspirin  and started on heparin drip.  I discussed the case with Dr. Harrell Gave from cardiology who accepted the patient at [facility] for further management.  Final Clinical Impressions(s) / ED Diagnoses   Final diagnoses:  NSTEMI (non-ST elevated myocardial infarction) ([place])   I personally performed the services described in this documentation, which was scribed in my presence. The recorded information has been reviewed and is accurate.        Fatima Blank, MD 11/27/16 0040

## 2016-11-26 NOTE — ED Notes (Signed)
Dr. Leonette Monarch  aware that troponin is 2.78.

## 2016-11-26 NOTE — Progress Notes (Signed)
ANTICOAGULATION CONSULT NOTE - Initial Consult  Pharmacy Consult for Heparin Indication: chest pain/ACS  No Known Allergies  Patient Measurements: Height: 5\' 9"  (175.3 cm) Weight: 229 lb (103.9 kg) IBW/kg (Calculated) : 70.7 Heparin Dosing Weight: 93 kg  Vital Signs: Temp: 97.9 F (36.6 C) (08/05 1812) Temp Source: Oral (08/05 1812) BP: 145/97 (08/05 2115) Pulse Rate: 68 (08/05 2100)  Labs:  Recent Labs  11/24/16 0931 11/26/16 1857  HGB 16.2 15.8  HCT 48.1 45.2  PLT 163.0 167  CREATININE 1.14 1.20  TROPONINI  --  2.78*    Estimated Creatinine Clearance: 84.6 mL/min (by C-G formula based on SCr of 1.2 mg/dL).   Medical History: Past Medical History:  Diagnosis Date  . BPH (benign prostatic hyperplasia)   . Chest pain, atypical   . Cluster headache    hx of  . Complication of anesthesia    difficult waking up"  . Diverticulosis   . Drug abuse    hx of  . Dyspnea on exertion   . GERD (gastroesophageal reflux disease)   . Hiatal hernia   . Hyperlipidemia   . Hypertension   . Hypothyroidism   . Nontoxic multinodular goiter   . Pulmonary nodule --- CT 10/19/2014: Nodule is stable, no further routine x-rays 01/06/2009  . TIA (transient ischemic attack)   . Trigeminal neuralgia   . Tubular adenoma of colon 12/2015  . Unspecified asthma(493.90)     Assessment: 23 YOM who presented to [facility] 8/[facility] with worsening CP and found to have NSTEMI. Pharmacy consulted to start heparin for anticoagulation while awaiting further cardiology evaluation.   Noted recent surgery of jaw cyst excision on 10/31/16 - which appears to have been a minor surgery. No other surgeries within the past 3 months noted. Baseline CBC wnl, Hep Wt: 93 kg  Goal of Therapy:  Heparin level 0.3-0.7 units/ml Monitor platelets by anticoagulation protocol: Yes   Plan:  1. Heparin 4000 unit bolus x 1 2. Start Heparin at 1300 units/hr (13 ml/hr) 3. Daily HL, CBC 4. Will continue to monitor for  any signs/symptoms of bleeding and will follow up with heparin level in 6 hours   Thank you for allowing pharmacy to be a part of this patient's care.  Alycia Rossetti, PharmD, BCPS Clinical Pharmacist Pager: [phone] 11/26/2016 9:49 PM

## 2016-11-26 NOTE — ED Notes (Signed)
Pt given a total of 2 SL nitrol with zero relief. Pt states pain is increasing to a 8/10. Dr Leonette Monarch aware and orders received to hold any further nitro. Remains sinus on monitor. B/P improved after 2nd nitro to 147/96.

## 2016-11-26 NOTE — ED Notes (Addendum)
Per Charnae, Nursing Secretary on 2 Central (where patient has been assigned to 2C02 @ [facility] in [place]) - patient has been "diverted to another unit." Bed assignment in [facility] now shows [facility]. Nursing Secretary on 4E states "my charge nurse has not even assigned that patient, we cannot take report at this time." Secretary advised that [facility] is on the way to [facility] and will arrive to transport patient around 2300. Secretary states they will call [facility] back for report.

## 2016-11-26 NOTE — ED Notes (Signed)
Delay in arrival of [facility] d/t bed change. [facility] called to say they are en route at this time

## 2016-11-26 NOTE — ED Triage Notes (Signed)
Patient states that he woke up this am and had pain to his left chest , that radiates to his left axilla region. Patient reports that he is nauseated as well. The patient is belching as well.

## 2016-11-26 NOTE — ED Notes (Signed)
Pt c/o of anterior chest pain with nausea. Pt remains SR on monitor. MD aware and orders received.

## 2016-11-27 ENCOUNTER — Encounter (HOSPITAL_COMMUNITY): Payer: Self-pay | Admitting: General Practice

## 2016-11-27 ENCOUNTER — Other Ambulatory Visit: Payer: Self-pay

## 2016-11-27 ENCOUNTER — Encounter (HOSPITAL_COMMUNITY)
Admission: EM | Disposition: A | Discharge status: Home / Self Care | Payer: Self-pay | Source: Home / Self Care | Attending: Cardiovascular Disease

## 2016-11-27 DIAGNOSIS — E785 Hyperlipidemia, unspecified: Secondary | ICD-10-CM

## 2016-11-27 DIAGNOSIS — K219 Gastro-esophageal reflux disease without esophagitis: Secondary | ICD-10-CM

## 2016-11-27 DIAGNOSIS — Z720 Tobacco use: Secondary | ICD-10-CM

## 2016-11-27 DIAGNOSIS — I25110 Atherosclerotic heart disease of native coronary artery with unstable angina pectoris: Secondary | ICD-10-CM

## 2016-11-27 DIAGNOSIS — E7800 Pure hypercholesterolemia, unspecified: Secondary | ICD-10-CM

## 2016-11-27 DIAGNOSIS — I10 Essential (primary) hypertension: Secondary | ICD-10-CM

## 2016-11-27 DIAGNOSIS — I214 Non-ST elevation (NSTEMI) myocardial infarction: Principal | ICD-10-CM

## 2016-11-27 HISTORY — PX: LEFT HEART CATH AND CORONARY ANGIOGRAPHY: CATH118249

## 2016-11-27 HISTORY — PX: CORONARY STENT INTERVENTION: CATH118234

## 2016-11-27 HISTORY — PX: CORONARY STENT PLACEMENT: SHX1402

## 2016-11-27 LAB — BASIC METABOLIC PANEL
Anion gap: 11 (ref 5–15)
BUN: 15 mg/dL (ref 6–20)
CO2: 23 mmol/L (ref 22–32)
Calcium: 9 mg/dL (ref 8.9–10.3)
Chloride: 104 mmol/L (ref 101–111)
Creatinine, Ser: 1.07 mg/dL (ref 0.61–1.24)
GFR calc Af Amer: >60 mL/min (ref >60)
GFR calc non Af Amer: >60 mL/min (ref >60)
Glucose, Bld: 109 mg/dL — ABNORMAL HIGH (ref 65–99)
Potassium: 3.7 mmol/L (ref 3.5–5.1)
Sodium: 138 mmol/L (ref 135–145)

## 2016-11-27 LAB — PROTIME-INR
INR: 1.11
Prothrombin Time: 14.4 seconds (ref 11.4–15.2)

## 2016-11-27 LAB — CBC
HCT: 44 % (ref 39.0–52.0)
Hemoglobin: 14.9 g/dL (ref 13.0–17.0)
MCH: 31.4 pg (ref 26.0–34.0)
MCHC: 33.9 g/dL (ref 30.0–36.0)
MCV: 92.6 fL (ref 78.0–100.0)
Platelets: 146 10*3/uL — ABNORMAL LOW (ref 150–400)
RBC: 4.75 MIL/uL (ref 4.22–5.81)
RDW: 13.7 % (ref 11.5–15.5)
WBC: 11.6 10*3/uL — ABNORMAL HIGH (ref 4.0–10.5)

## 2016-11-27 LAB — TROPONIN I
Troponin I: 8.91 ng/mL (ref <0.03)
Troponin I: 7.13 ng/mL (ref <0.03)
Troponin I: >65 ng/mL (ref <0.03)

## 2016-11-27 LAB — LIPID PANEL
Cholesterol: 298 mg/dL — ABNORMAL HIGH (ref 0–200)
HDL: 79 mg/dL (ref >40)
LDL Cholesterol: 199 mg/dL — ABNORMAL HIGH (ref 0–99)
Total CHOL/HDL Ratio: 3.8 RATIO
Triglycerides: 98 mg/dL (ref <150)
VLDL: 20 mg/dL (ref 0–40)

## 2016-11-27 LAB — HEPARIN LEVEL (UNFRACTIONATED): Heparin Unfractionated: 0.56 IU/mL (ref 0.30–0.70)

## 2016-11-27 SURGERY — LEFT HEART CATH AND CORONARY ANGIOGRAPHY
Anesthesia: LOCAL

## 2016-11-27 MED ORDER — ACETAMINOPHEN 325 MG PO TABS: 650.0000 mg | ORAL_TABLET | ORAL | Status: DC | PRN

## 2016-11-27 MED ORDER — ONDANSETRON HCL 4 MG/2ML IJ SOLN
4.0000 mg | Freq: Four times a day (QID) | INTRAMUSCULAR | Status: DC | PRN
  Administered 2016-11-27: 4 mg via INTRAVENOUS
  Filled 2016-11-27: qty 2

## 2016-11-27 MED ORDER — CLOPIDOGREL BISULFATE 300 MG PO TABS
ORAL_TABLET | ORAL | Status: AC
  Filled 2016-11-27: qty 2

## 2016-11-27 MED ORDER — HYDROMORPHONE HCL 1 MG/ML IJ SOLN
1.0000 mg | INTRAMUSCULAR | Status: DC | PRN
  Administered 2016-11-27 (×2): 1 mg via INTRAVENOUS
  Filled 2016-11-27 (×2): qty 1

## 2016-11-27 MED ORDER — HEPARIN (PORCINE) IN NACL 2-0.9 UNIT/ML-% IJ SOLN
INTRAMUSCULAR | Status: DC | PRN
  Administered 2016-11-27: 1000 mL

## 2016-11-27 MED ORDER — HEART ATTACK BOUNCING BOOK
Freq: Once | Status: AC
  Administered 2016-11-27: 22:00:00
  Filled 2016-11-27: qty 1

## 2016-11-27 MED ORDER — IOPAMIDOL (ISOVUE-370) INJECTION 76%
INTRAVENOUS | Status: AC
  Filled 2016-11-27: qty 100

## 2016-11-27 MED ORDER — IOPAMIDOL (ISOVUE-370) INJECTION 76%
INTRAVENOUS | Status: DC | PRN
  Administered 2016-11-27: 235 mL via INTRA_ARTERIAL

## 2016-11-27 MED ORDER — SODIUM CHLORIDE 0.9 % IV SOLN: 250.0000 mL | INTRAVENOUS | Status: DC | PRN

## 2016-11-27 MED ORDER — SODIUM CHLORIDE 0.9% FLUSH
3.0000 mL | Freq: Two times a day (BID) | INTRAVENOUS | Status: DC
Start: 2016-11-27 — End: 2016-11-30
  Administered 2016-11-27 – 2016-11-29 (×4): 3 mL via INTRAVENOUS

## 2016-11-27 MED ORDER — FAMOTIDINE 20 MG PO TABS
20.0000 mg | ORAL_TABLET | Freq: Every day | ORAL | Status: DC
  Administered 2016-11-27 – 2016-11-29 (×4): 20 mg via ORAL
  Filled 2016-11-27 (×4): qty 1

## 2016-11-27 MED ORDER — ASPIRIN 81 MG PO CHEW
81.0000 mg | CHEWABLE_TABLET | Freq: Every day | ORAL | Status: DC
  Administered 2016-11-28 – 2016-11-30 (×3): 81 mg via ORAL
  Filled 2016-11-27 (×3): qty 1

## 2016-11-27 MED ORDER — FENTANYL CITRATE (PF) 100 MCG/2ML IJ SOLN
INTRAMUSCULAR | Status: DC | PRN
  Administered 2016-11-27 (×2): 50 ug via INTRAVENOUS

## 2016-11-27 MED ORDER — DICYCLOMINE HCL 20 MG PO TABS
20.0000 mg | ORAL_TABLET | Freq: Three times a day (TID) | ORAL | Status: DC
  Administered 2016-11-27 – 2016-11-30 (×9): 20 mg via ORAL
  Filled 2016-11-27 (×13): qty 1

## 2016-11-27 MED ORDER — METOPROLOL TARTRATE 12.5 MG HALF TABLET
12.5000 mg | ORAL_TABLET | Freq: Two times a day (BID) | ORAL | Status: DC
  Administered 2016-11-27 – 2016-11-28 (×3): 12.5 mg via ORAL
  Filled 2016-11-27 (×3): qty 1

## 2016-11-27 MED ORDER — SODIUM CHLORIDE 0.9% FLUSH: 3.0000 mL | Freq: Two times a day (BID) | INTRAVENOUS | Status: DC

## 2016-11-27 MED ORDER — SODIUM CHLORIDE 0.9% FLUSH: 3.0000 mL | INTRAVENOUS | Status: DC | PRN

## 2016-11-27 MED ORDER — CLOPIDOGREL BISULFATE 75 MG PO TABS
75.0000 mg | ORAL_TABLET | Freq: Every day | ORAL | Status: DC
  Administered 2016-11-28 – 2016-11-30 (×3): 75 mg via ORAL
  Filled 2016-11-27 (×3): qty 1

## 2016-11-27 MED ORDER — HEPARIN SODIUM (PORCINE) 1000 UNIT/ML IJ SOLN
INTRAMUSCULAR | Status: AC
  Filled 2016-11-27: qty 1

## 2016-11-27 MED ORDER — NITROGLYCERIN 1 MG/10 ML FOR IR/CATH LAB
INTRA_ARTERIAL | Status: AC
  Filled 2016-11-27: qty 10

## 2016-11-27 MED ORDER — MIDAZOLAM HCL 2 MG/2ML IJ SOLN
INTRAMUSCULAR | Status: DC | PRN
  Administered 2016-11-27 (×2): 1 mg via INTRAVENOUS

## 2016-11-27 MED ORDER — LABETALOL HCL 5 MG/ML IV SOLN: 10.0000 mg | INTRAVENOUS | Status: DC | PRN

## 2016-11-27 MED ORDER — FENTANYL CITRATE (PF) 100 MCG/2ML IJ SOLN
50.0000 ug | Freq: Once | INTRAMUSCULAR | Status: AC
  Administered 2016-11-27: 50 ug via INTRAVENOUS
  Filled 2016-11-27: qty 2

## 2016-11-27 MED ORDER — PROMETHAZINE HCL 25 MG PO TABS
25.0000 mg | ORAL_TABLET | Freq: Four times a day (QID) | ORAL | Status: DC | PRN
  Administered 2016-11-27 – 2016-11-28 (×4): 25 mg via ORAL
  Filled 2016-11-27 (×4): qty 1

## 2016-11-27 MED ORDER — LIDOCAINE HCL (PF) 1 % IJ SOLN
INTRAMUSCULAR | Status: AC
  Filled 2016-11-27: qty 30

## 2016-11-27 MED ORDER — VERAPAMIL HCL 2.5 MG/ML IV SOLN
INTRAVENOUS | Status: AC
  Filled 2016-11-27: qty 2

## 2016-11-27 MED ORDER — LIDOCAINE HCL (PF) 1 % IJ SOLN
INTRAMUSCULAR | Status: DC | PRN
  Administered 2016-11-27: 2 mL via SUBCUTANEOUS

## 2016-11-27 MED ORDER — ANGIOPLASTY BOOK
Freq: Once | Status: AC
  Administered 2016-11-27: 22:00:00
  Filled 2016-11-27: qty 1

## 2016-11-27 MED ORDER — SODIUM CHLORIDE 0.9 % WEIGHT BASED INFUSION
3.0000 mL/kg/h | INTRAVENOUS | Status: DC
  Administered 2016-11-27: 3 mL/kg/h via INTRAVENOUS

## 2016-11-27 MED ORDER — TRAZODONE HCL 50 MG PO TABS
25.0000 mg | ORAL_TABLET | Freq: Every evening | ORAL | Status: DC | PRN
  Administered 2016-11-27 – 2016-11-29 (×3): 25 mg via ORAL
  Filled 2016-11-27 (×3): qty 1

## 2016-11-27 MED ORDER — ASPIRIN EC 81 MG PO TBEC: 81.0000 mg | DELAYED_RELEASE_TABLET | Freq: Every day | ORAL | Status: DC

## 2016-11-27 MED ORDER — SODIUM CHLORIDE 0.9 % WEIGHT BASED INFUSION: 1.0000 mL/kg/h | INTRAVENOUS | Status: DC

## 2016-11-27 MED ORDER — GI COCKTAIL ~~LOC~~
30.0000 mL | Freq: Two times a day (BID) | ORAL | Status: DC | PRN
  Administered 2016-11-28: 19:00:00 30 mL via ORAL
  Filled 2016-11-27: qty 30

## 2016-11-27 MED ORDER — NITROGLYCERIN 1 MG/10 ML FOR IR/CATH LAB
INTRA_ARTERIAL | Status: DC | PRN
  Administered 2016-11-27: 200 ug
  Administered 2016-11-27: 200 ug via INTRACORONARY

## 2016-11-27 MED ORDER — MIDAZOLAM HCL 2 MG/2ML IJ SOLN
INTRAMUSCULAR | Status: AC
  Filled 2016-11-27: qty 2

## 2016-11-27 MED ORDER — NITROGLYCERIN IN D5W 200-5 MCG/ML-% IV SOLN: 5.0000 ug/min | INTRAVENOUS | Status: DC

## 2016-11-27 MED ORDER — PANTOPRAZOLE SODIUM 40 MG PO TBEC
40.0000 mg | DELAYED_RELEASE_TABLET | Freq: Two times a day (BID) | ORAL | Status: DC
  Administered 2016-11-27 – 2016-11-30 (×6): 40 mg via ORAL
  Filled 2016-11-27 (×6): qty 1

## 2016-11-27 MED ORDER — FENTANYL CITRATE (PF) 100 MCG/2ML IJ SOLN
INTRAMUSCULAR | Status: AC
  Filled 2016-11-27: qty 2

## 2016-11-27 MED ORDER — ATORVASTATIN CALCIUM 80 MG PO TABS
80.0000 mg | ORAL_TABLET | Freq: Every day | ORAL | Status: DC
  Administered 2016-11-27 – 2016-11-29 (×3): 80 mg via ORAL
  Filled 2016-11-27 (×3): qty 1

## 2016-11-27 MED ORDER — PANTOPRAZOLE SODIUM 40 MG PO TBEC
40.0000 mg | DELAYED_RELEASE_TABLET | Freq: Every day | ORAL | Status: DC
  Administered 2016-11-27: 40 mg via ORAL
  Filled 2016-11-27: qty 1

## 2016-11-27 MED ORDER — CLOPIDOGREL BISULFATE 300 MG PO TABS
ORAL_TABLET | ORAL | Status: DC | PRN
  Administered 2016-11-27: 600 mg via ORAL

## 2016-11-27 MED ORDER — HEPARIN SODIUM (PORCINE) 1000 UNIT/ML IJ SOLN
INTRAMUSCULAR | Status: DC | PRN
  Administered 2016-11-27: 3000 [IU] via INTRAVENOUS
  Administered 2016-11-27 (×2): 5500 [IU] via INTRAVENOUS
  Administered 2016-11-27: 2000 [IU] via INTRAVENOUS

## 2016-11-27 MED ORDER — HYDRALAZINE HCL 20 MG/ML IJ SOLN: 5.0000 mg | INTRAMUSCULAR | Status: DC | PRN

## 2016-11-27 MED ORDER — ONDANSETRON HCL 4 MG/2ML IJ SOLN: 4.0000 mg | Freq: Four times a day (QID) | INTRAMUSCULAR | Status: DC | PRN

## 2016-11-27 MED ORDER — IOPAMIDOL (ISOVUE-370) INJECTION 76%
INTRAVENOUS | Status: AC
  Filled 2016-11-27: qty 125

## 2016-11-27 MED ORDER — POLYVINYL ALCOHOL 1.4 % OP SOLN
2.0000 [drp] | OPHTHALMIC | Status: DC | PRN
  Administered 2016-11-27 – 2016-11-29 (×4): 2 [drp] via OPHTHALMIC
  Filled 2016-11-27: qty 15

## 2016-11-27 MED ORDER — HEPARIN (PORCINE) IN NACL 2-0.9 UNIT/ML-% IJ SOLN
INTRAMUSCULAR | Status: AC
  Filled 2016-11-27: qty 1000

## 2016-11-27 MED ORDER — SODIUM CHLORIDE 0.9 % IV SOLN: INTRAVENOUS | Status: DC

## 2016-11-27 MED ORDER — LOSARTAN POTASSIUM 50 MG PO TABS
100.0000 mg | ORAL_TABLET | Freq: Every day | ORAL | Status: DC
  Administered 2016-11-27 – 2016-11-30 (×4): 100 mg via ORAL
  Filled 2016-11-27 (×5): qty 2

## 2016-11-27 MED ORDER — ASPIRIN 81 MG PO CHEW
81.0000 mg | CHEWABLE_TABLET | ORAL | Status: AC
  Administered 2016-11-27: 81 mg via ORAL
  Filled 2016-11-27: qty 1

## 2016-11-27 MED ORDER — HEPARIN SODIUM (PORCINE) 5000 UNIT/ML IJ SOLN
5000.0000 [IU] | Freq: Three times a day (TID) | INTRAMUSCULAR | Status: DC
  Administered 2016-11-28 – 2016-11-30 (×7): 5000 [IU] via SUBCUTANEOUS
  Filled 2016-11-27 (×7): qty 1

## 2016-11-27 MED ORDER — NITROGLYCERIN IN D5W 200-5 MCG/ML-% IV SOLN
INTRAVENOUS | Status: AC
  Administered 2016-11-27: 5 ug/min
  Filled 2016-11-27: qty 250

## 2016-11-27 MED ORDER — HEPARIN (PORCINE) IN NACL 2-0.9 UNIT/ML-% IJ SOLN
INTRAMUSCULAR | Status: DC | PRN
  Administered 2016-11-27: 10 mL via INTRA_ARTERIAL

## 2016-11-27 MED ORDER — GI COCKTAIL ~~LOC~~
30.0000 mL | Freq: Once | ORAL | Status: AC
  Administered 2016-11-27: 30 mL via ORAL
  Filled 2016-11-27: qty 30

## 2016-11-27 SURGICAL SUPPLY — 24 items
BALLN MINITREK OTW 2.0X12 (BALLOONS) ×2
BALLN WOLVERINE 2.50X6 (BALLOONS) ×2
BALLN ~~LOC~~ EMERGE MR 3.5X12 (BALLOONS) ×2
BALLOON MINITREK OTW 2.0X12 (BALLOONS) ×1 IMPLANT
BALLOON WOLVERINE 2.50X6 (BALLOONS) ×1 IMPLANT
BALLOON ~~LOC~~ EMERGE MR 3.5X12 (BALLOONS) ×1 IMPLANT
CATH INFINITI 5 FR JL3.5 (CATHETERS) ×2 IMPLANT
CATH INFINITI JR4 5F (CATHETERS) ×2 IMPLANT
CATH VISTA GUIDE 6FR XB3 (CATHETERS) ×2 IMPLANT
COVER PRB 48X5XTLSCP FOLD TPE (BAG) ×1 IMPLANT
COVER PROBE 5X48 (BAG) ×1
DEVICE RAD COMP TR BAND LRG (VASCULAR PRODUCTS) ×2 IMPLANT
GLIDESHEATH SLEND A-KIT 6F 22G (SHEATH) ×2 IMPLANT
GUIDEWIRE INQWIRE 1.5J.035X260 (WIRE) ×1 IMPLANT
INQWIRE 1.5J .035X260CM (WIRE) ×2
KIT ENCORE 26 ADVANTAGE (KITS) ×2 IMPLANT
KIT HEART LEFT (KITS) ×2 IMPLANT
PACK CARDIAC CATHETERIZATION (CUSTOM PROCEDURE TRAY) ×2 IMPLANT
STENT XIENCE ALPINE RX 3.0X15 (Permanent Stent) ×2 IMPLANT
TRANSDUCER W/STOPCOCK (MISCELLANEOUS) ×2 IMPLANT
TUBING CIL FLEX 10 FLL-RA (TUBING) ×2 IMPLANT
WIRE ASAHI PROWATER 180CM (WIRE) ×2 IMPLANT
WIRE DOC EXTENSION .014X145CM (WIRE) ×2 IMPLANT
WIRE GUIDE ASAHI EXTENSION 165 (WIRE) ×2 IMPLANT

## 2016-11-27 NOTE — Significant Event (Signed)
Rapid Response Event Note  Overview:  While on the floor with another patient advised of active CP patient needing urgent cath - came to assist Time Called: 0828 Arrival Time: 0828 Event Type: Cardiac  Initial Focused Assessment: Lying in bed NAD - warm and dry - resps regular and unlabored - bil BS = clear - complains of chest pressure at 8/10/  SR on monitor with occ PVC.  NTG and Heparin infusing.  No nausea at this point but does state he has chronic nausea at home which he takes Phenergan for.  Dr. Martinique has seen patient needs urgent cath - NSTEMI dx with increasing Troponin levels and pain.     Interventions:  Baby ASA given, Dilaudid 1 mg IV given - patient reports NTG not usually any help but the Dilaudid does.  Pain down to a 6/10.  NAD - resps reg - talking on phone with family.  Complains of some nausea  - mild - Zofran IV given.  NS infusion started per order.  Patient states pain down to 4/10.  See flowsheet for vital signs. Remains SR.   Cath team here for transport.  Patient with some increasing pain/pressure in route - NTG titrated to 10 mcg.  Handoff to cath lab team - patient NAD.    Plan of Care (if not transferred):  Will follow post cath as needed.    Event Summary: Name of Physician Notified: Dr. Martinique at  (pta RRT)    at    Outcome: Other (Comment) (to cath lab)  Event End Time: 0920  Quin Hoop

## 2016-11-27 NOTE — Progress Notes (Signed)
CRITICAL VALUE ALERT  Critical value received: Troponin I    >65.00  Date of notification:  11/27/2016  Time of notification: 2158  Critical value read back:Yes.    Nurse who received alert:  Dina Rich  MD notified (1st page):  Jonelle Sidle PA  Time of first page:  1337  Responding MD: Jonelle Sidle PA  Time MD responded:  [phone]

## 2016-11-27 NOTE — H&P (View-Only) (Signed)
Progress Note  Patient Name: [name] Date of Encounter: 11/27/2016  Primary Cardiologist: Crissie Sickles MD  Subjective   Patient complains of persistent chest pain. Worse when lying on back. Radiates to left arm. Associated dyspnea. Notes pain off and on since Friday.  Inpatient Medications    Scheduled Meds: . aspirin  81 mg Oral Pre-Cath  . aspirin EC  81 mg Oral Daily  . atorvastatin  80 mg Oral q1800  . dicyclomine  20 mg Oral TID AC  . famotidine  20 mg Oral QHS  . losartan  100 mg Oral Daily  . metoprolol tartrate  12.5 mg Oral BID  . pantoprazole  40 mg Oral Daily  . sodium chloride flush  3 mL Intravenous Q12H   Continuous Infusions: . sodium chloride    . sodium chloride     Followed by  . sodium chloride    . heparin 1,300 Units/hr (11/26/16 2202)   PRN Meds: sodium chloride, acetaminophen, HYDROmorphone (DILAUDID) injection, nitroGLYCERIN, ondansetron (ZOFRAN) IV, promethazine, sodium chloride flush, traZODone   Vital Signs    Vitals:   11/27/16 0101 11/27/16 0103 11/27/16 0512 11/27/16 0758  BP: (!) 161/107  (!) 156/105 (!) 156/116  Pulse: 66   66  Resp: 11   12  Temp: 98.7 F (37.1 C)  98.1 F (36.7 C) 98.3 F (36.8 C)  TempSrc: Oral  Oral Oral  SpO2: 98% 98%  96%  Weight:   229 lb 3.2 oz (104 kg)   Height:   5\' 10"  (1.778 m)     Intake/Output Summary (Last 24 hours) at 11/27/16 0800 Last data filed at 11/27/16 0100  Gross per 24 hour  Intake               13 ml  Output                0 ml  Net               13 ml   Filed Weights   11/26/16 1812 11/27/16 0512  Weight: 229 lb (103.9 kg) 229 lb 3.2 oz (104 kg)    Telemetry    NSR with rare PVC - Personally Reviewed  ECG    NSR with rate 70. T wave inversion in V6.  - Personally Reviewed  Physical Exam   GEN: overweight BM in modest distress.   Neck: No JVD or bruits.  Cardiac: RRR, no murmurs, rubs, or gallops. Normal S1-2. Femoral, pedal, radial pulses 2+. Respiratory:  Clear to auscultation bilaterally. GI: Soft, nontender, non-distended  MS: No edema; No deformity. Neuro:  Nonfocal  Psych: Normal affect   Labs    Chemistry Recent Labs Lab 11/24/16 0931 11/26/16 1857 11/27/16 0123  NA 140 137 138  K 4.0 3.7 3.7  CL 106 104 104  CO2 26 23 23   GLUCOSE 110* 116* 109*  BUN 21 17 15   CREATININE 1.14 1.20 1.07  CALCIUM 9.1 9.1 9.0  PROT 6.8  --   --   ALBUMIN 4.1  --   --   AST 20  --   --   ALT 14  --   --   ALKPHOS 40  --   --   BILITOT 0.5  --   --   GFRNONAA  --  >60 >60  GFRAA  --  >60 >60  ANIONGAP  --  10 11     Hematology Recent Labs Lab 11/24/16 0931 11/26/16 1857  WBC 8.1  9.8  RBC 4.93 4.89  HGB 16.2 15.8  HCT 48.1 45.2  MCV 97.5 92.4  MCH  --  32.3  MCHC 33.7 35.0  RDW 14.7 13.2  PLT 163.0 167    Cardiac Enzymes Recent Labs Lab 11/26/16 1857 11/27/16 0123  TROPONINI 2.78* 8.91*   No results for input(s): TROPIPOC in the last 168 hours.   BNPNo results for input(s): BNP, PROBNP in the last 168 hours.   DDimer No results for input(s): DDIMER in the last 168 hours.   Radiology    Dg Chest 2 View  Result Date: 11/26/2016 CLINICAL DATA:  Acute onset of chest pain since this morning beneath left arm. Dizziness and hypertension. Smoker. EXAM: CHEST  2 VIEW COMPARISON:  10/29/2015 CXR, 10/19/2014 CT FINDINGS: The heart size and mediastinal contours are within normal limits. Both lungs are clear. Nodular density seen on remote CT is not radiographically apparent. The visualized skeletal structures are unremarkable with partially included right shoulder arthroplasty noted. IMPRESSION: No active cardiopulmonary disease. Electronically Signed   By: Ashley Royalty M.D.   On: 11/26/2016 19:24   Ct Angio Chest Aorta W And/or Wo Contrast  Result Date: 11/26/2016 CLINICAL DATA:  Acute onset of left-sided chest pain. Nausea and belching. Initial encounter. EXAM: CT ANGIOGRAPHY CHEST, ABDOMEN AND PELVIS TECHNIQUE: Multidetector CT  imaging through the chest, abdomen and pelvis was performed using the standard protocol during bolus administration of intravenous contrast. Multiplanar reconstructed images and MIPs were obtained and reviewed to evaluate the vascular anatomy. CONTRAST:  100 mL of Isovue 370 IV contrast COMPARISON:  Chest radiograph performed earlier today at 7:01 p.m., and CTA of the chest performed 10/19/2014 FINDINGS: CTA CHEST FINDINGS Cardiovascular: There is no evidence of aortic dissection. There is no evidence of aneurysmal dilatation. No calcific atherosclerotic disease is seen along the thoracic aorta. The great vessels are grossly unremarkable in appearance. There is no evidence of significant pulmonary embolus. The heart remains normal in size. Scattered coronary artery calcifications are seen. Mediastinum/Nodes: The mediastinum is unremarkable in appearance. No mediastinal lymphadenopathy is seen. No pericardial effusion is identified. A 3.7 cm left thyroid hypodensity is noted. A 1.6 cm right thyroid hypodensity is seen. These appear relatively stable from studies dating back to 2012, and are likely benign. No axillary lymphadenopathy is appreciated. Lungs/Pleura: Minimal bilateral dependent subsegmental atelectasis is noted. No pleural effusion or pneumothorax is seen. No masses are identified. Musculoskeletal: No acute osseous abnormalities are identified. The patient's right shoulder arthroplasty is incompletely imaged, but appears grossly unremarkable. The visualized musculature is unremarkable in appearance. Review of the MIP images confirms the above findings. CTA ABDOMEN AND PELVIS FINDINGS VASCULAR Aorta: There is no evidence of aortic dissection. There is no evidence of aneurysmal dilatation. Mild scattered calcification is seen along the abdominal aorta and its branches. Celiac: The celiac trunk appears fully patent. SMA: The superior mesenteric artery remains fully patent. Renals: The renal arteries appear  patent bilaterally. Two right-sided renal arteries are seen. IMA: The inferior mesenteric artery remains patent. Inflow: Scattered calcification is noted along the common and internal iliac arteries bilaterally, and along the common femoral arteries bilaterally. Veins: Visualized venous structures are grossly unremarkable, though difficult to fully assess given the phase of contrast enhancement. Review of the MIP images confirms the above findings. NON-VASCULAR Hepatobiliary: The liver is unremarkable in appearance. The gallbladder is unremarkable in appearance. The common bile duct remains normal in caliber. Pancreas: The pancreas is within normal limits. Spleen: The spleen is unremarkable  in appearance. Adrenals/Urinary Tract: The adrenal glands are unremarkable in appearance. Scattered left renal cysts are seen. There is no evidence of hydronephrosis. No renal or ureteral stones are identified. No perinephric stranding is seen. A 1.5 cm heterogeneous nodule at the lateral aspect of the left kidney is grossly stable from 2013 and likely benign. Stomach/Bowel: The stomach is unremarkable in appearance. The small bowel is within normal limits. The appendix is normal in caliber, without evidence of appendicitis. Mild scattered diverticulosis is noted along the descending colon, without evidence of diverticulitis. Lymphatic: No retroperitoneal or pelvic sidewall lymphadenopathy is seen. Reproductive: The bladder is decompressed and not well assessed. The prostate remains normal in size. Other: No additional soft tissue abnormalities are seen. Musculoskeletal: No acute osseous abnormalities are identified. The visualized musculature is unremarkable in appearance. Review of the MIP images confirms the above findings. IMPRESSION: 1. No evidence of aortic dissection. No evidence of aneurysmal dilatation. Mild scattered aortic atherosclerosis. 2. No evidence of significant pulmonary embolus. 3. Stable thyroid hypodensities  are likely benign. 4. Minimal bilateral dependent subsegmental atelectasis noted. Lungs otherwise clear. 5. Scattered left renal cysts. 6. 1.5 cm heterogeneous nodule at the lateral aspect of the left kidney is grossly stable from 2013 and likely benign. 7. Mild diverticulosis along the descending colon, without evidence of diverticulitis. Electronically Signed   By: Garald Balding M.D.   On: 11/26/2016 21:54   Ct Angio Abd/pel W And/or Wo Contrast  Result Date: 11/26/2016 CLINICAL DATA:  Acute onset of left-sided chest pain. Nausea and belching. Initial encounter. EXAM: CT ANGIOGRAPHY CHEST, ABDOMEN AND PELVIS TECHNIQUE: Multidetector CT imaging through the chest, abdomen and pelvis was performed using the standard protocol during bolus administration of intravenous contrast. Multiplanar reconstructed images and MIPs were obtained and reviewed to evaluate the vascular anatomy. CONTRAST:  100 mL of Isovue 370 IV contrast COMPARISON:  Chest radiograph performed earlier today at 7:01 p.m., and CTA of the chest performed 10/19/2014 FINDINGS: CTA CHEST FINDINGS Cardiovascular: There is no evidence of aortic dissection. There is no evidence of aneurysmal dilatation. No calcific atherosclerotic disease is seen along the thoracic aorta. The great vessels are grossly unremarkable in appearance. There is no evidence of significant pulmonary embolus. The heart remains normal in size. Scattered coronary artery calcifications are seen. Mediastinum/Nodes: The mediastinum is unremarkable in appearance. No mediastinal lymphadenopathy is seen. No pericardial effusion is identified. A 3.7 cm left thyroid hypodensity is noted. A 1.6 cm right thyroid hypodensity is seen. These appear relatively stable from studies dating back to 2012, and are likely benign. No axillary lymphadenopathy is appreciated. Lungs/Pleura: Minimal bilateral dependent subsegmental atelectasis is noted. No pleural effusion or pneumothorax is seen. No masses  are identified. Musculoskeletal: No acute osseous abnormalities are identified. The patient's right shoulder arthroplasty is incompletely imaged, but appears grossly unremarkable. The visualized musculature is unremarkable in appearance. Review of the MIP images confirms the above findings. CTA ABDOMEN AND PELVIS FINDINGS VASCULAR Aorta: There is no evidence of aortic dissection. There is no evidence of aneurysmal dilatation. Mild scattered calcification is seen along the abdominal aorta and its branches. Celiac: The celiac trunk appears fully patent. SMA: The superior mesenteric artery remains fully patent. Renals: The renal arteries appear patent bilaterally. Two right-sided renal arteries are seen. IMA: The inferior mesenteric artery remains patent. Inflow: Scattered calcification is noted along the common and internal iliac arteries bilaterally, and along the common femoral arteries bilaterally. Veins: Visualized venous structures are grossly unremarkable, though difficult to fully assess  given the phase of contrast enhancement. Review of the MIP images confirms the above findings. NON-VASCULAR Hepatobiliary: The liver is unremarkable in appearance. The gallbladder is unremarkable in appearance. The common bile duct remains normal in caliber. Pancreas: The pancreas is within normal limits. Spleen: The spleen is unremarkable in appearance. Adrenals/Urinary Tract: The adrenal glands are unremarkable in appearance. Scattered left renal cysts are seen. There is no evidence of hydronephrosis. No renal or ureteral stones are identified. No perinephric stranding is seen. A 1.5 cm heterogeneous nodule at the lateral aspect of the left kidney is grossly stable from 2013 and likely benign. Stomach/Bowel: The stomach is unremarkable in appearance. The small bowel is within normal limits. The appendix is normal in caliber, without evidence of appendicitis. Mild scattered diverticulosis is noted along the descending colon,  without evidence of diverticulitis. Lymphatic: No retroperitoneal or pelvic sidewall lymphadenopathy is seen. Reproductive: The bladder is decompressed and not well assessed. The prostate remains normal in size. Other: No additional soft tissue abnormalities are seen. Musculoskeletal: No acute osseous abnormalities are identified. The visualized musculature is unremarkable in appearance. Review of the MIP images confirms the above findings. IMPRESSION: 1. No evidence of aortic dissection. No evidence of aneurysmal dilatation. Mild scattered aortic atherosclerosis. 2. No evidence of significant pulmonary embolus. 3. Stable thyroid hypodensities are likely benign. 4. Minimal bilateral dependent subsegmental atelectasis noted. Lungs otherwise clear. 5. Scattered left renal cysts. 6. 1.5 cm heterogeneous nodule at the lateral aspect of the left kidney is grossly stable from 2013 and likely benign. 7. Mild diverticulosis along the descending colon, without evidence of diverticulitis. Electronically Signed   By: Garald Balding M.D.   On: 11/26/2016 21:54    Cardiac Studies   none  Patient Profile     56 y.o. 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 NSTEMI. Troponin increased 2.78>>8.91. Minimal Ecg changes. Ongoing chest pain. CT negative for PE or dissection. On beta blocker, ASA, IV heparin. Will start IV Ntg. Recommend urgent cardiac cath this am. Prior cardiac cath in August 2016 showed nonobstructive CAD. Discussed with cath lab. Keep NPO. The procedure and risks were reviewed including but not limited to death, myocardial infarction, stroke, arrythmias, bleeding, transfusion, emergency surgery, dye allergy, or renal dysfunction. The patient voices understanding and is agreeable to proceed. Will also check Echo later today. 2. HTN. Poorly controlled. Patient reports good control prior to admission. On beta blocker and  losartan. Will start IV Ntg. BP may be higher due to ongoing pain.  3. Hypercholesterolemia. Severely elevated with LDL 190. Patient notes he doesn't always take his cholesterol pill. Start high dose lipitor.  4. GERD. On protonix.  5. Tobacco use. Recommend complete smoking cessation.  Signed, Kaidance Pantoja Martinique, MD  11/27/2016, 8:00 AM

## 2016-11-27 NOTE — Interval H&P Note (Signed)
Cath Lab Visit (complete for each Cath Lab visit)  Clinical Evaluation Leading to the Procedure:   ACS: Yes.    Non-ACS:    Anginal Classification: CCS IV  Anti-ischemic medical therapy: Minimal Therapy (1 class of medications)  Non-Invasive Test Results: No non-invasive testing performed  Prior CABG: No previous CABG      History and Physical Interval Note:  11/27/2016 9:21 AM  [name]  has presented today for surgery, with the diagnosis of cp  The various methods of treatment have been discussed with the patient and family. After consideration of risks, benefits and other options for treatment, the patient has consented to  Procedure(s): LEFT HEART CATH AND CORONARY ANGIOGRAPHY (N/A) as a surgical intervention .  The patient's history has been reviewed, patient examined, no change in status, stable for surgery.  I have reviewed the patient's chart and labs.  Questions were answered to the patient's satisfaction.     [name]

## 2016-11-27 NOTE — Progress Notes (Signed)
CRITICAL VALUE ALERT  Critical Value:  Trop I 8.91  Date & Time Notied:  11/27/16 0301  Provider Notified: Dr Harrell Gave  Orders Received/Actions taken: will monitor

## 2016-11-27 NOTE — H&P (Signed)
CARDIOLOGY HISTORY & PHYSICAL   Referring Physician: Dr. Leonette Monarch, [facility] ER Primary Physician: Riki Sheer Primary Cardiologist: Dr. Cristopher Peru Reason for Admission: NSTEMI  HPI: Mr. Wiechman is a 55 yo man with PMH of CAD, HTN, HLD, and GERD who was transferred to [facility] from [facility] ER for NSTEMI. The patient reports that 2 days ago (Friday) he felt poorly, though it was his GERD. Took additional doses of his medications and felt better on Saturday. Yesterday (Sunday) he got home from work around noon and felt chest discomfort. He went to mow his grass, and he developed dyspnea on exertion and left sided chest pain radiating to his left shoulder. He has never had pain like this before. He had nausea without emesis, but he also suffers from chronic nausea and is unsure if this was different. Denies melena, hematochezia, hematemesis. Is pending outpatient endoscopy in about a month.  Risk factors include HTN, HLD, and tobacco use.   In the ER, he was noted to have an elevated troponin. He continued to have chest pain, which was worse with SL NG and better with fentanyl. CTA was negative for dissection or PE. He received 324 mg aspirin and was started on a heparin drip prior to transfer  Review of Systems:     Cardiac Review of Systems: {Y] = yes [ ]  = no  Chest Pain [  Y  ]  Resting SOB [ N  ] Exertional SOB  [ Y ]  Orthopnea [  N]   Pedal Edema [  N ]    Palpitations Aqua.Slicker  ] Syncope  Aqua.Slicker  ]   Presyncope [ N  ]  General Review of Systems: [Y] = yes [  ]=no Constitional: recent weight change [  ]; anorexia [  ]; fatigue [  ]; nausea [  Y]; night sweats [  ]; fever [  ]; or chills [  ];                                                                     Eyes : blurred vision [  ]; diplopia [   ]; vision changes [  ];  Amaurosis fugax[  ]; Resp: cough [  ];  wheezing[  ];  hemoptysis[  ];  PND [  ];  GI:  gallstones[  ], vomiting[ Y ];  dysphagia[  ]; melena[  ];  hematochezia [  ];  heartburn[Y  ];   GU: kidney stones [  ]; hematuria[  ];   dysuria [  ];  nocturia[  ]; incontinence [  ];             Skin: rash, swelling[  ];, hair loss[  ];  peripheral edema[  ];  or itching[  ]; Musculosketetal: myalgias[  ];  joint swelling[  ];  joint erythema[  ];  joint pain[  ];  back pain[  ];  Heme/Lymph: bruising[  ];  bleeding[  ];  anemia[  ];  Neuro: TIA[  ];  headaches[  ];  stroke[  ];  vertigo[  ];  seizures[  ];   paresthesias[  ];  difficulty walking[  ];  Psych:depression[  ]; anxiety[  ];  Endocrine: diabetes[  ];  thyroid dysfunction[  ];  Other:  Past Medical History:  Diagnosis Date  . BPH (benign prostatic hyperplasia)   . Chest pain, atypical   . Cluster headache    hx of  . Complication of anesthesia    difficult waking up"  . Diverticulosis   . Drug abuse    hx of  . Dyspnea on exertion   . GERD (gastroesophageal reflux disease)   . Hiatal hernia   . Hyperlipidemia   . Hypertension   . Hypothyroidism   . Nontoxic multinodular goiter   . Pulmonary nodule --- CT 10/19/2014: Nodule is stable, no further routine x-rays 01/06/2009  . TIA (transient ischemic attack)   . Trigeminal neuralgia   . Tubular adenoma of colon 12/2015  . Unspecified asthma(493.90)     Medications Prior to Admission  Medication Sig Dispense Refill  . aspirin EC 81 MG tablet Take 81 mg by mouth daily.    Marland Kitchen atorvastatin (LIPITOR) 20 MG tablet Take 1 tablet (20 mg total) by mouth at bedtime. 30 tablet 5  . Cetirizine-Pseudoephedrine (ZYRTEC-D PO) Take 1 tablet by mouth daily.    . chlorhexidine (PERIDEX) 0.12 % solution As directed.    . dicyclomine (BENTYL) 20 MG tablet Take 1 tablet (20 mg total) by mouth 3 (three) times daily before meals. 60 tablet 1  . Fructose-Dextrose-Phosphor Acd (NAUSETROL PO) Take 1 tablet by mouth daily as needed.    . lansoprazole (PREVACID) 30 MG capsule Take 1 capsule (30 mg total) by mouth 2 (two) times daily. 60 capsule 5  . loperamide (IMODIUM  A-D) 2 MG tablet Take 4 mg by mouth 4 (four) times daily as needed for diarrhea or loose stools.    Marland Kitchen losartan (COZAAR) 100 MG tablet Take 1 tablet (100 mg total) by mouth daily. 30 tablet 3  . ondansetron (ZOFRAN) 8 MG tablet Take by mouth 2 (two) times daily.    . promethazine (PHENERGAN) 12.5 MG tablet Take 1/2 -1 tablet by mouth three times a day as needed 90 tablet 1  . promethazine (PHENERGAN) 25 MG tablet TAKE ONE TABLET BY MOUTH EVERY 8 HOURS AS NEEDED FOR FOR NAUSEA AND VOMITING. DO NOT TAKE WITH ZOFRAN 30 tablet 0  . ranitidine (ZANTAC) 150 MG tablet Take 1 tablet (150 mg total) by mouth at bedtime. 30 tablet 5  . tobramycin (TOBREX) 0.3 % ophthalmic solution Place 1 drop into the right eye every 4 (four) hours. 5 mL 0  . tobramycin (TOBREX) 0.3 % ophthalmic solution Apply to eye. Apply 1 drop to eye as needed.    . traZODone (DESYREL) 50 MG tablet Take 0.5-1 tablets (25-50 mg total) by mouth at bedtime as needed for sleep. 30 tablet 3       Infusions: . heparin 1,300 Units/hr (11/26/16 2202)    No Known Allergies  Social History   Social History  . Marital status: Married    Spouse name: N/A  . Number of children: 2  . 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 Smoking status: Current Some Day Smoker    Packs/day: 0.25    Years: 18.00    Types: Cigarettes  . Smokeless tobacco: Never Used     Comment: smokes about 1 cigarette daily  . Alcohol use Yes     Comment: rare   . Drug use: No  . Sexual activity: Not on file   Other Topics  Concern  . Not on file   Social History Narrative   HS[employer]   Culinary school in [place]   Married - '84 - 5 years divorced; remarried '96   1 son - '85                 Family History  Problem Relation Age of Onset  . Diabetes Mother   . Hyperlipidemia Mother   . Hypertension Mother   . Hypertension Father   . Heart disease Father        CAD/CHF  .  Cancer Father        Mesothelioma  . Heart disease Maternal Aunt   . Prostate cancer Neg Hx   . Colon cancer Neg Hx     PHYSICAL EXAM: Vitals:   11/26/16 2330 11/26/16 2343  BP: (!) 149/102   Pulse: 63   Resp: 15   Temp:  97.9 F (36.6 C)    No intake or output data in the 24 hours ending 11/27/16 0059  General:  Well appearing. No respiratory difficulty HEENT: normal Neck: supple. no JVD. Carotids 2+ bilat; no bruits. No lymphadenopathy or thryomegaly appreciated. Cor: PMI nondisplaced. Regular rate & rhythm. No rubs, gallops or murmurs. Lungs: clear Abdomen: soft, nontender, nondistended. No hepatosplenomegaly. No bruits or masses. Good bowel sounds. Extremities: no cyanosis, clubbing, rash, edema Neuro: alert & oriented x 3, cranial nerves grossly intact. moves all 4 extremities w/o difficulty. Affect pleasant.  ECG:  Results for orders placed or performed during the hospital encounter of 11/26/16 (from the past 24 hour(s))  Basic metabolic panel     Status: Abnormal   Collection Time: 11/26/16  6:57 PM  Result Value Ref Range   Sodium 137 135 - 145 mmol/L   Potassium 3.7 3.5 - 5.1 mmol/L   Chloride 104 101 - 111 mmol/L   CO2 23 22 - 32 mmol/L   Glucose, Bld 116 (H) 65 - 99 mg/dL   BUN 17 6 - 20 mg/dL   Creatinine, Ser 1.20 0.61 - 1.24 mg/dL   Calcium 9.1 8.9 - 10.3 mg/dL   GFR calc non Af Amer >60 >60 mL/min   GFR calc Af Amer >60 >60 mL/min   Anion gap 10 5 - 15  CBC     Status: None   Collection Time: 11/26/16  6:57 PM  Result Value Ref Range   WBC 9.8 4.0 - 10.5 K/uL   RBC 4.89 4.22 - 5.81 MIL/uL   Hemoglobin 15.8 13.0 - 17.0 g/dL   HCT 45.2 39.0 - 52.0 %   MCV 92.4 78.0 - 100.0 fL   MCH 32.3 26.0 - 34.0 pg   MCHC 35.0 30.0 - 36.0 g/dL   RDW 13.2 11.5 - 15.5 %   Platelets 167 150 - 400 K/uL  Troponin I     Status: Abnormal   Collection Time: 11/26/16  6:57 PM  Result Value Ref Range   Troponin I 2.78 (HH) <0.03 ng/mL  Rapid urine drug screen  (hospital performed)     Status: None   Collection Time: 11/26/16  9:02 PM  Result Value Ref Range   Opiates NONE DETECTED NONE DETECTED   Cocaine NONE DETECTED NONE DETECTED   Benzodiazepines NONE DETECTED NONE DETECTED   Amphetamines NONE DETECTED NONE DETECTED   Tetrahydrocannabinol NONE DETECTED NONE DETECTED   Barbiturates NONE DETECTED NONE DETECTED   Dg Chest 2 View  Result Date: 11/26/2016 CLINICAL DATA:  Acute onset of chest pain since this morning  beneath left arm. Dizziness and hypertension. Smoker. EXAM: CHEST  2 VIEW COMPARISON:  10/29/2015 CXR, 10/19/2014 CT FINDINGS: The heart size and mediastinal contours are within normal limits. Both lungs are clear. Nodular density seen on remote CT is not radiographically apparent. The visualized skeletal structures are unremarkable with partially included right shoulder arthroplasty noted. IMPRESSION: No active cardiopulmonary disease. Electronically Signed   By: Ashley Royalty M.D.   On: 11/26/2016 19:24   Ct Angio Chest Aorta W And/or Wo Contrast  Result Date: 11/26/2016 CLINICAL DATA:  Acute onset of left-sided chest pain. Nausea and belching. Initial encounter. EXAM: CT ANGIOGRAPHY CHEST, ABDOMEN AND PELVIS TECHNIQUE: Multidetector CT imaging through the chest, abdomen and pelvis was performed using the standard protocol during bolus administration of intravenous contrast. Multiplanar reconstructed images and MIPs were obtained and reviewed to evaluate the vascular anatomy. CONTRAST:  100 mL of Isovue 370 IV contrast COMPARISON:  Chest radiograph performed earlier today at 7:01 p.m., and CTA of the chest performed 10/19/2014 FINDINGS: CTA CHEST FINDINGS Cardiovascular: There is no evidence of aortic dissection. There is no evidence of aneurysmal dilatation. No calcific atherosclerotic disease is seen along the thoracic aorta. The great vessels are grossly unremarkable in appearance. There is no evidence of significant pulmonary embolus. The heart  remains normal in size. Scattered coronary artery calcifications are seen. Mediastinum/Nodes: The mediastinum is unremarkable in appearance. No mediastinal lymphadenopathy is seen. No pericardial effusion is identified. A 3.7 cm left thyroid hypodensity is noted. A 1.6 cm right thyroid hypodensity is seen. These appear relatively stable from studies dating back to 2012, and are likely benign. No axillary lymphadenopathy is appreciated. Lungs/Pleura: Minimal bilateral dependent subsegmental atelectasis is noted. No pleural effusion or pneumothorax is seen. No masses are identified. Musculoskeletal: No acute osseous abnormalities are identified. The patient's right shoulder arthroplasty is incompletely imaged, but appears grossly unremarkable. The visualized musculature is unremarkable in appearance. Review of the MIP images confirms the above findings. CTA ABDOMEN AND PELVIS FINDINGS VASCULAR Aorta: There is no evidence of aortic dissection. There is no evidence of aneurysmal dilatation. Mild scattered calcification is seen along the abdominal aorta and its branches. Celiac: The celiac trunk appears fully patent. SMA: The superior mesenteric artery remains fully patent. Renals: The renal arteries appear patent bilaterally. Two right-sided renal arteries are seen. IMA: The inferior mesenteric artery remains patent. Inflow: Scattered calcification is noted along the common and internal iliac arteries bilaterally, and along the common femoral arteries bilaterally. Veins: Visualized venous structures are grossly unremarkable, though difficult to fully assess given the phase of contrast enhancement. Review of the MIP images confirms the above findings. NON-VASCULAR Hepatobiliary: The liver is unremarkable in appearance. The gallbladder is unremarkable in appearance. The common bile duct remains normal in caliber. Pancreas: The pancreas is within normal limits. Spleen: The spleen is unremarkable in appearance.  Adrenals/Urinary Tract: The adrenal glands are unremarkable in appearance. Scattered left renal cysts are seen. There is no evidence of hydronephrosis. No renal or ureteral stones are identified. No perinephric stranding is seen. A 1.5 cm heterogeneous nodule at the lateral aspect of the left kidney is grossly stable from 2013 and likely benign. Stomach/Bowel: The stomach is unremarkable in appearance. The small bowel is within normal limits. The appendix is normal in caliber, without evidence of appendicitis. Mild scattered diverticulosis is noted along the descending colon, without evidence of diverticulitis. Lymphatic: No retroperitoneal or pelvic sidewall lymphadenopathy is seen. Reproductive: The bladder is decompressed and not well assessed. The prostate remains  normal in size. Other: No additional soft tissue abnormalities are seen. Musculoskeletal: No acute osseous abnormalities are identified. The visualized musculature is unremarkable in appearance. Review of the MIP images confirms the above findings. IMPRESSION: 1. No evidence of aortic dissection. No evidence of aneurysmal dilatation. Mild scattered aortic atherosclerosis. 2. No evidence of significant pulmonary embolus. 3. Stable thyroid hypodensities are likely benign. 4. Minimal bilateral dependent subsegmental atelectasis noted. Lungs otherwise clear. 5. Scattered left renal cysts. 6. 1.5 cm heterogeneous nodule at the lateral aspect of the left kidney is grossly stable from 2013 and likely benign. 7. Mild diverticulosis along the descending colon, without evidence of diverticulitis. Electronically Signed   By: Garald Balding M.D.   On: 11/26/2016 21:54   Ct Angio Abd/pel W And/or Wo Contrast  Result Date: 11/26/2016 CLINICAL DATA:  Acute onset of left-sided chest pain. Nausea and belching. Initial encounter. EXAM: CT ANGIOGRAPHY CHEST, ABDOMEN AND PELVIS TECHNIQUE: Multidetector CT imaging through the chest, abdomen and pelvis was performed  using the standard protocol during bolus administration of intravenous contrast. Multiplanar reconstructed images and MIPs were obtained and reviewed to evaluate the vascular anatomy. CONTRAST:  100 mL of Isovue 370 IV contrast COMPARISON:  Chest radiograph performed earlier today at 7:01 p.m., and CTA of the chest performed 10/19/2014 FINDINGS: CTA CHEST FINDINGS Cardiovascular: There is no evidence of aortic dissection. There is no evidence of aneurysmal dilatation. No calcific atherosclerotic disease is seen along the thoracic aorta. The great vessels are grossly unremarkable in appearance. There is no evidence of significant pulmonary embolus. The heart remains normal in size. Scattered coronary artery calcifications are seen. Mediastinum/Nodes: The mediastinum is unremarkable in appearance. No mediastinal lymphadenopathy is seen. No pericardial effusion is identified. A 3.7 cm left thyroid hypodensity is noted. A 1.6 cm right thyroid hypodensity is seen. These appear relatively stable from studies dating back to 2012, and are likely benign. No axillary lymphadenopathy is appreciated. Lungs/Pleura: Minimal bilateral dependent subsegmental atelectasis is noted. No pleural effusion or pneumothorax is seen. No masses are identified. Musculoskeletal: No acute osseous abnormalities are identified. The patient's right shoulder arthroplasty is incompletely imaged, but appears grossly unremarkable. The visualized musculature is unremarkable in appearance. Review of the MIP images confirms the above findings. CTA ABDOMEN AND PELVIS FINDINGS VASCULAR Aorta: There is no evidence of aortic dissection. There is no evidence of aneurysmal dilatation. Mild scattered calcification is seen along the abdominal aorta and its branches. Celiac: The celiac trunk appears fully patent. SMA: The superior mesenteric artery remains fully patent. Renals: The renal arteries appear patent bilaterally. Two right-sided renal arteries are seen.  IMA: The inferior mesenteric artery remains patent. Inflow: Scattered calcification is noted along the common and internal iliac arteries bilaterally, and along the common femoral arteries bilaterally. Veins: Visualized venous structures are grossly unremarkable, though difficult to fully assess given the phase of contrast enhancement. Review of the MIP images confirms the above findings. NON-VASCULAR Hepatobiliary: The liver is unremarkable in appearance. The gallbladder is unremarkable in appearance. The common bile duct remains normal in caliber. Pancreas: The pancreas is within normal limits. Spleen: The spleen is unremarkable in appearance. Adrenals/Urinary Tract: The adrenal glands are unremarkable in appearance. Scattered left renal cysts are seen. There is no evidence of hydronephrosis. No renal or ureteral stones are identified. No perinephric stranding is seen. A 1.5 cm heterogeneous nodule at the lateral aspect of the left kidney is grossly stable from 2013 and likely benign. Stomach/Bowel: The stomach is unremarkable in appearance. The small  bowel is within normal limits. The appendix is normal in caliber, without evidence of appendicitis. Mild scattered diverticulosis is noted along the descending colon, without evidence of diverticulitis. Lymphatic: No retroperitoneal or pelvic sidewall lymphadenopathy is seen. Reproductive: The bladder is decompressed and not well assessed. The prostate remains normal in size. Other: No additional soft tissue abnormalities are seen. Musculoskeletal: No acute osseous abnormalities are identified. The visualized musculature is unremarkable in appearance. Review of the MIP images confirms the above findings. IMPRESSION: 1. No evidence of aortic dissection. No evidence of aneurysmal dilatation. Mild scattered aortic atherosclerosis. 2. No evidence of significant pulmonary embolus. 3. Stable thyroid hypodensities are likely benign. 4. Minimal bilateral dependent  subsegmental atelectasis noted. Lungs otherwise clear. 5. Scattered left renal cysts. 6. 1.5 cm heterogeneous nodule at the lateral aspect of the left kidney is grossly stable from 2013 and likely benign. 7. Mild diverticulosis along the descending colon, without evidence of diverticulitis. Electronically Signed   By: Garald Balding M.D.   On: 11/26/2016 21:54   Coronary angiography 11/25/2014: 1. Prox Cx to Mid Cx lesion, 40% stenosed. 2. Mid RCA to Dist RCA lesion, 50% stenosed.    Nonobstructive mid and distal RCA plaquing less than 50% and less than 50% plaque in the proximal to mid circumflex. These vessels are widely patent as is the left anterior descending.  Overall normal LV systolic function with an ejection fraction of 50-55%.  Chest discomfort is not related to obstructive coronary disease.  Echo 10/19/2014 Study Conclusions  - Left ventricle: The cavity size was normal. Wall thickness was   increased in a pattern of mild LVH. Systolic function was normal.   The estimated ejection fraction was in the range of 60% to 65%.   Wall motion was normal; there were no regional wall motion   abnormalities. The study is not technically sufficient to allow   evaluation of LV diastolic function. - Right ventricle: The cavity size was mildly dilated. - Right atrium: The atrium was mildly to moderately dilated.  ASSESSMENT/PLAN: Mr. Cloe is a 56 yo man with PMH of CAD, HTN, HLD, and GERD who was transferred to [facility] ER for NSTEMI. He had nonobstructive CAD in 2016, but he has risk factors for ACS.  NSTEMI: -trend troponin -heparin drip -aspirin 81 mg daily, already received 324 mg loading dose -atorvastatin 80 mg -start metoprolol 12.5 mg BID -SL NG PRN, patient declines as this makes his pain worse -has received significant doses of pain meds for control of pain in [facility] ER, reports that nitroglycerin makes it worse. Will give one additional dose of fentanyl, but per  [facility] already received 175 mcg fentanyl over the last several hours in the ER. Watch for respiratory depression. Patient denies OSA/use of CPAP -NPO at [facility] for possible cath in the AM -echo  -cardiac rehab referral -tobacco cessation counseling  HTN: had recently been controlled, elevated with pain here -continue losartan  -starting metoprolol per ACS  HLD -atorvastatin high intensity  GERD, nausea/vomiting: complex symptoms -reports phenergan is only thing that helps nausea, will continue. Uses zofran for breakthrough as well -will trial GI cocktail -continue bentyl with meals  FULL CODE  Buford Dresser, MD, PhD, overnight cardiology provider

## 2016-11-27 NOTE — Progress Notes (Signed)
[place] for Heparin Indication: chest pain/ACS  No Known Allergies  Patient Measurements: Height: 5\' 10"  (177.8 cm) Weight: 229 lb 3.2 oz (104 kg) IBW/kg (Calculated) : 73 Heparin Dosing Weight: 93 kg  Vital Signs: Temp: 98.1 F (36.7 C) (08/06 0512) Temp Source: Oral (08/06 0512) BP: 156/105 (08/06 0512) Pulse Rate: 66 (08/06 0101)  Labs:  Recent Labs  11/24/16 0931 11/26/16 1857 11/27/16 0123 11/27/16 0459  HGB 16.2 15.8  --   --   HCT 48.1 45.2  --   --   PLT 163.0 167  --   --   LABPROT  --   --  14.4  --   INR  --   --  1.11  --   HEPARINUNFRC  --   --   --  0.56  CREATININE 1.14 1.20 1.07  --   TROPONINI  --  2.78* 8.91*  --     Estimated Creatinine Clearance: 96.4 mL/min (by C-G formula based on SCr of 1.07 mg/dL). Assessment: 56 y.o. male with chest pain for heparin   Goal of Therapy:  Heparin level 0.3-0.7 units/ml Monitor platelets by anticoagulation protocol: Yes   Plan:  Continue Heparin at current rate  Follow up after cath today   Phillis Knack, PharmD, BCPS  11/27/2016 5:46 AM

## 2016-11-27 NOTE — Progress Notes (Signed)
Progress Note  Patient Name: [name] Date of Encounter: 11/27/2016  Primary Cardiologist: Crissie Sickles MD  Subjective   Patient complains of persistent chest pain. Worse when lying on back. Radiates to left arm. Associated dyspnea. Notes pain off and on since Friday.  Inpatient Medications    Scheduled Meds: . aspirin  81 mg Oral Pre-Cath  . aspirin EC  81 mg Oral Daily  . atorvastatin  80 mg Oral q1800  . dicyclomine  20 mg Oral TID AC  . famotidine  20 mg Oral QHS  . losartan  100 mg Oral Daily  . metoprolol tartrate  12.5 mg Oral BID  . pantoprazole  40 mg Oral Daily  . sodium chloride flush  3 mL Intravenous Q12H   Continuous Infusions: . sodium chloride    . sodium chloride     Followed by  . sodium chloride    . heparin 1,300 Units/hr (11/26/16 2202)   PRN Meds: sodium chloride, acetaminophen, HYDROmorphone (DILAUDID) injection, nitroGLYCERIN, ondansetron (ZOFRAN) IV, promethazine, sodium chloride flush, traZODone   Vital Signs    Vitals:   11/27/16 0101 11/27/16 0103 11/27/16 0512 11/27/16 0758  BP: (!) 161/107  (!) 156/105 (!) 156/116  Pulse: 66   66  Resp: 11   12  Temp: 98.7 F (37.1 C)  98.1 F (36.7 C) 98.3 F (36.8 C)  TempSrc: Oral  Oral Oral  SpO2: 98% 98%  96%  Weight:   229 lb 3.2 oz (104 kg)   Height:   5\' 10"  (1.778 m)     Intake/Output Summary (Last 24 hours) at 11/27/16 0800 Last data filed at 11/27/16 0100  Gross per 24 hour  Intake               13 ml  Output                0 ml  Net               13 ml   Filed Weights   11/26/16 1812 11/27/16 0512  Weight: 229 lb (103.9 kg) 229 lb 3.2 oz (104 kg)    Telemetry    NSR with rare PVC - Personally Reviewed  ECG    NSR with rate 70. T wave inversion in V6.  - Personally Reviewed  Physical Exam   GEN: overweight BM in modest distress.   Neck: No JVD or bruits.  Cardiac: RRR, no murmurs, rubs, or gallops. Normal S1-2. Femoral, pedal, radial pulses 2+. Respiratory:  Clear to auscultation bilaterally. GI: Soft, nontender, non-distended  MS: No edema; No deformity. Neuro:  Nonfocal  Psych: Normal affect   Labs    Chemistry Recent Labs Lab 11/24/16 0931 11/26/16 1857 11/27/16 0123  NA 140 137 138  K 4.0 3.7 3.7  CL 106 104 104  CO2 26 23 23   GLUCOSE 110* 116* 109*  BUN 21 17 15   CREATININE 1.14 1.20 1.07  CALCIUM 9.1 9.1 9.0  PROT 6.8  --   --   ALBUMIN 4.1  --   --   AST 20  --   --   ALT 14  --   --   ALKPHOS 40  --   --   BILITOT 0.5  --   --   GFRNONAA  --  >60 >60  GFRAA  --  >60 >60  ANIONGAP  --  10 11     Hematology Recent Labs Lab 11/24/16 0931 11/26/16 1857  WBC 8.1  9.8  RBC 4.93 4.89  HGB 16.2 15.8  HCT 48.1 45.2  MCV 97.5 92.4  MCH  --  32.3  MCHC 33.7 35.0  RDW 14.7 13.2  PLT 163.0 167    Cardiac Enzymes Recent Labs Lab 11/26/16 1857 11/27/16 0123  TROPONINI 2.78* 8.91*   No results for input(s): TROPIPOC in the last 168 hours.   BNPNo results for input(s): BNP, PROBNP in the last 168 hours.   DDimer No results for input(s): DDIMER in the last 168 hours.   Radiology    Dg Chest 2 View  Result Date: 11/26/2016 CLINICAL DATA:  Acute onset of chest pain since this morning beneath left arm. Dizziness and hypertension. Smoker. EXAM: CHEST  2 VIEW COMPARISON:  10/29/2015 CXR, 10/19/2014 CT FINDINGS: The heart size and mediastinal contours are within normal limits. Both lungs are clear. Nodular density seen on remote CT is not radiographically apparent. The visualized skeletal structures are unremarkable with partially included right shoulder arthroplasty noted. IMPRESSION: No active cardiopulmonary disease. Electronically Signed   By: Ashley Royalty M.D.   On: 11/26/2016 19:24   Ct Angio Chest Aorta W And/or Wo Contrast  Result Date: 11/26/2016 CLINICAL DATA:  Acute onset of left-sided chest pain. Nausea and belching. Initial encounter. EXAM: CT ANGIOGRAPHY CHEST, ABDOMEN AND PELVIS TECHNIQUE: Multidetector CT  imaging through the chest, abdomen and pelvis was performed using the standard protocol during bolus administration of intravenous contrast. Multiplanar reconstructed images and MIPs were obtained and reviewed to evaluate the vascular anatomy. CONTRAST:  100 mL of Isovue 370 IV contrast COMPARISON:  Chest radiograph performed earlier today at 7:01 p.m., and CTA of the chest performed 10/19/2014 FINDINGS: CTA CHEST FINDINGS Cardiovascular: There is no evidence of aortic dissection. There is no evidence of aneurysmal dilatation. No calcific atherosclerotic disease is seen along the thoracic aorta. The great vessels are grossly unremarkable in appearance. There is no evidence of significant pulmonary embolus. The heart remains normal in size. Scattered coronary artery calcifications are seen. Mediastinum/Nodes: The mediastinum is unremarkable in appearance. No mediastinal lymphadenopathy is seen. No pericardial effusion is identified. A 3.7 cm left thyroid hypodensity is noted. A 1.6 cm right thyroid hypodensity is seen. These appear relatively stable from studies dating back to 2012, and are likely benign. No axillary lymphadenopathy is appreciated. Lungs/Pleura: Minimal bilateral dependent subsegmental atelectasis is noted. No pleural effusion or pneumothorax is seen. No masses are identified. Musculoskeletal: No acute osseous abnormalities are identified. The patient's right shoulder arthroplasty is incompletely imaged, but appears grossly unremarkable. The visualized musculature is unremarkable in appearance. Review of the MIP images confirms the above findings. CTA ABDOMEN AND PELVIS FINDINGS VASCULAR Aorta: There is no evidence of aortic dissection. There is no evidence of aneurysmal dilatation. Mild scattered calcification is seen along the abdominal aorta and its branches. Celiac: The celiac trunk appears fully patent. SMA: The superior mesenteric artery remains fully patent. Renals: The renal arteries appear  patent bilaterally. Two right-sided renal arteries are seen. IMA: The inferior mesenteric artery remains patent. Inflow: Scattered calcification is noted along the common and internal iliac arteries bilaterally, and along the common femoral arteries bilaterally. Veins: Visualized venous structures are grossly unremarkable, though difficult to fully assess given the phase of contrast enhancement. Review of the MIP images confirms the above findings. NON-VASCULAR Hepatobiliary: The liver is unremarkable in appearance. The gallbladder is unremarkable in appearance. The common bile duct remains normal in caliber. Pancreas: The pancreas is within normal limits. Spleen: The spleen is unremarkable  in appearance. Adrenals/Urinary Tract: The adrenal glands are unremarkable in appearance. Scattered left renal cysts are seen. There is no evidence of hydronephrosis. No renal or ureteral stones are identified. No perinephric stranding is seen. A 1.5 cm heterogeneous nodule at the lateral aspect of the left kidney is grossly stable from 2013 and likely benign. Stomach/Bowel: The stomach is unremarkable in appearance. The small bowel is within normal limits. The appendix is normal in caliber, without evidence of appendicitis. Mild scattered diverticulosis is noted along the descending colon, without evidence of diverticulitis. Lymphatic: No retroperitoneal or pelvic sidewall lymphadenopathy is seen. Reproductive: The bladder is decompressed and not well assessed. The prostate remains normal in size. Other: No additional soft tissue abnormalities are seen. Musculoskeletal: No acute osseous abnormalities are identified. The visualized musculature is unremarkable in appearance. Review of the MIP images confirms the above findings. IMPRESSION: 1. No evidence of aortic dissection. No evidence of aneurysmal dilatation. Mild scattered aortic atherosclerosis. 2. No evidence of significant pulmonary embolus. 3. Stable thyroid hypodensities  are likely benign. 4. Minimal bilateral dependent subsegmental atelectasis noted. Lungs otherwise clear. 5. Scattered left renal cysts. 6. 1.5 cm heterogeneous nodule at the lateral aspect of the left kidney is grossly stable from 2013 and likely benign. 7. Mild diverticulosis along the descending colon, without evidence of diverticulitis. Electronically Signed   By: Garald Balding M.D.   On: 11/26/2016 21:54   Ct Angio Abd/pel W And/or Wo Contrast  Result Date: 11/26/2016 CLINICAL DATA:  Acute onset of left-sided chest pain. Nausea and belching. Initial encounter. EXAM: CT ANGIOGRAPHY CHEST, ABDOMEN AND PELVIS TECHNIQUE: Multidetector CT imaging through the chest, abdomen and pelvis was performed using the standard protocol during bolus administration of intravenous contrast. Multiplanar reconstructed images and MIPs were obtained and reviewed to evaluate the vascular anatomy. CONTRAST:  100 mL of Isovue 370 IV contrast COMPARISON:  Chest radiograph performed earlier today at 7:01 p.m., and CTA of the chest performed 10/19/2014 FINDINGS: CTA CHEST FINDINGS Cardiovascular: There is no evidence of aortic dissection. There is no evidence of aneurysmal dilatation. No calcific atherosclerotic disease is seen along the thoracic aorta. The great vessels are grossly unremarkable in appearance. There is no evidence of significant pulmonary embolus. The heart remains normal in size. Scattered coronary artery calcifications are seen. Mediastinum/Nodes: The mediastinum is unremarkable in appearance. No mediastinal lymphadenopathy is seen. No pericardial effusion is identified. A 3.7 cm left thyroid hypodensity is noted. A 1.6 cm right thyroid hypodensity is seen. These appear relatively stable from studies dating back to 2012, and are likely benign. No axillary lymphadenopathy is appreciated. Lungs/Pleura: Minimal bilateral dependent subsegmental atelectasis is noted. No pleural effusion or pneumothorax is seen. No masses  are identified. Musculoskeletal: No acute osseous abnormalities are identified. The patient's right shoulder arthroplasty is incompletely imaged, but appears grossly unremarkable. The visualized musculature is unremarkable in appearance. Review of the MIP images confirms the above findings. CTA ABDOMEN AND PELVIS FINDINGS VASCULAR Aorta: There is no evidence of aortic dissection. There is no evidence of aneurysmal dilatation. Mild scattered calcification is seen along the abdominal aorta and its branches. Celiac: The celiac trunk appears fully patent. SMA: The superior mesenteric artery remains fully patent. Renals: The renal arteries appear patent bilaterally. Two right-sided renal arteries are seen. IMA: The inferior mesenteric artery remains patent. Inflow: Scattered calcification is noted along the common and internal iliac arteries bilaterally, and along the common femoral arteries bilaterally. Veins: Visualized venous structures are grossly unremarkable, though difficult to fully assess  given the phase of contrast enhancement. Review of the MIP images confirms the above findings. NON-VASCULAR Hepatobiliary: The liver is unremarkable in appearance. The gallbladder is unremarkable in appearance. The common bile duct remains normal in caliber. Pancreas: The pancreas is within normal limits. Spleen: The spleen is unremarkable in appearance. Adrenals/Urinary Tract: The adrenal glands are unremarkable in appearance. Scattered left renal cysts are seen. There is no evidence of hydronephrosis. No renal or ureteral stones are identified. No perinephric stranding is seen. A 1.5 cm heterogeneous nodule at the lateral aspect of the left kidney is grossly stable from 2013 and likely benign. Stomach/Bowel: The stomach is unremarkable in appearance. The small bowel is within normal limits. The appendix is normal in caliber, without evidence of appendicitis. Mild scattered diverticulosis is noted along the descending colon,  without evidence of diverticulitis. Lymphatic: No retroperitoneal or pelvic sidewall lymphadenopathy is seen. Reproductive: The bladder is decompressed and not well assessed. The prostate remains normal in size. Other: No additional soft tissue abnormalities are seen. Musculoskeletal: No acute osseous abnormalities are identified. The visualized musculature is unremarkable in appearance. Review of the MIP images confirms the above findings. IMPRESSION: 1. No evidence of aortic dissection. No evidence of aneurysmal dilatation. Mild scattered aortic atherosclerosis. 2. No evidence of significant pulmonary embolus. 3. Stable thyroid hypodensities are likely benign. 4. Minimal bilateral dependent subsegmental atelectasis noted. Lungs otherwise clear. 5. Scattered left renal cysts. 6. 1.5 cm heterogeneous nodule at the lateral aspect of the left kidney is grossly stable from 2013 and likely benign. 7. Mild diverticulosis along the descending colon, without evidence of diverticulitis. Electronically Signed   By: Garald Balding M.D.   On: 11/26/2016 21:54    Cardiac Studies   none  Patient Profile     56 y.o. 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 NSTEMI. Troponin increased 2.78>>8.91. Minimal Ecg changes. Ongoing chest pain. CT negative for PE or dissection. On beta blocker, ASA, IV heparin. Will start IV Ntg. Recommend urgent cardiac cath this am. Prior cardiac cath in August 2016 showed nonobstructive CAD. Discussed with cath lab. Keep NPO. The procedure and risks were reviewed including but not limited to death, myocardial infarction, stroke, arrythmias, bleeding, transfusion, emergency surgery, dye allergy, or renal dysfunction. The patient voices understanding and is agreeable to proceed. Will also check Echo later today. 2. HTN. Poorly controlled. Patient reports good control prior to admission. On beta blocker and  losartan. Will start IV Ntg. BP may be higher due to ongoing pain.  3. Hypercholesterolemia. Severely elevated with LDL 190. Patient notes he doesn't always take his cholesterol pill. Start high dose lipitor.  4. GERD. On protonix.  5. Tobacco use. Recommend complete smoking cessation.  Signed, Myrle Dues [place], MD  11/27/2016, 8:00 AM

## 2016-11-28 ENCOUNTER — Other Ambulatory Visit: Payer: Self-pay

## 2016-11-28 ENCOUNTER — Encounter (HOSPITAL_COMMUNITY): Payer: Self-pay | Admitting: Interventional Cardiology

## 2016-11-28 ENCOUNTER — Inpatient Hospital Stay (HOSPITAL_COMMUNITY): Payer: BLUE CROSS/BLUE SHIELD

## 2016-11-28 DIAGNOSIS — I5032 Chronic diastolic (congestive) heart failure: Secondary | ICD-10-CM

## 2016-11-28 LAB — CBC
HCT: 42.8 % (ref 39.0–52.0)
Hemoglobin: 14.6 g/dL (ref 13.0–17.0)
MCH: 31.7 pg (ref 26.0–34.0)
MCHC: 34.1 g/dL (ref 30.0–36.0)
MCV: 92.8 fL (ref 78.0–100.0)
Platelets: 153 10*3/uL (ref 150–400)
RBC: 4.61 MIL/uL (ref 4.22–5.81)
RDW: 13.8 % (ref 11.5–15.5)
WBC: 9.3 10*3/uL (ref 4.0–10.5)

## 2016-11-28 LAB — HEMOGLOBIN A1C
Hgb A1c MFr Bld: 5.4 % (ref 4.8–5.6)
Mean Plasma Glucose: 108 mg/dL

## 2016-11-28 LAB — BASIC METABOLIC PANEL
Anion gap: 9 (ref 5–15)
BUN: 15 mg/dL (ref 6–20)
CO2: 25 mmol/L (ref 22–32)
Calcium: 8.7 mg/dL — ABNORMAL LOW (ref 8.9–10.3)
Chloride: 103 mmol/L (ref 101–111)
Creatinine, Ser: 1.17 mg/dL (ref 0.61–1.24)
GFR calc Af Amer: >60 mL/min (ref >60)
GFR calc non Af Amer: >60 mL/min (ref >60)
Glucose, Bld: 102 mg/dL — ABNORMAL HIGH (ref 65–99)
Potassium: 4 mmol/L (ref 3.5–5.1)
Sodium: 137 mmol/L (ref 135–145)

## 2016-11-28 LAB — TROPONIN I
Troponin I: 6.6 ng/mL (ref <0.03)
Troponin I: 6.68 ng/mL (ref <0.03)

## 2016-11-28 LAB — POCT ACTIVATED CLOTTING TIME
Activated Clotting Time: 290 seconds
Activated Clotting Time: 307 seconds

## 2016-11-28 LAB — HEPARIN LEVEL (UNFRACTIONATED): Heparin Unfractionated: <0.1 IU/mL — ABNORMAL LOW (ref 0.30–0.70)

## 2016-11-28 LAB — MRSA PCR SCREENING: MRSA by PCR: NEGATIVE

## 2016-11-28 MED ORDER — METOPROLOL TARTRATE 25 MG PO TABS
25.0000 mg | ORAL_TABLET | Freq: Two times a day (BID) | ORAL | Status: DC
  Administered 2016-11-28 – 2016-11-30 (×4): 25 mg via ORAL
  Filled 2016-11-28 (×4): qty 1

## 2016-11-28 MED ORDER — MORPHINE SULFATE (PF) 4 MG/ML IV SOLN
2.0000 mg | Freq: Once | INTRAVENOUS | Status: AC
  Administered 2016-11-28: 2 mg via INTRAVENOUS
  Filled 2016-11-28: qty 1

## 2016-11-28 MED ORDER — PROMETHAZINE HCL 25 MG PO TABS
25.0000 mg | ORAL_TABLET | Freq: Four times a day (QID) | ORAL | Status: DC | PRN
  Administered 2016-11-28 – 2016-11-30 (×4): 25 mg via ORAL
  Filled 2016-11-28 (×4): qty 1

## 2016-11-28 NOTE — Progress Notes (Signed)
Pt given morphine 2mg  IV, placed on 2L/[place] O2, po phenergan given also pt states he felt some nausea.  As Morphine was given pt states his pain went to a 7 but then went back down to a 4.

## 2016-11-28 NOTE — Progress Notes (Signed)
Pt called nurse stating he was having chest pain and tingling in his finger tips of left hand. BP 100/59, 69 HR, SR, rates pain 4/10 pressure like pain somewhat like he had on Friday when he came in to hospital.  BP low at this time, call placed to NP Cecilie Kicks at this time for interventions (BP too low to give SL NTG unless cleared with MD/NP.)   @ [place], NP notified of the above, requested EKG for now and will look at EKG and decide other orders.

## 2016-11-28 NOTE — Progress Notes (Signed)
Progress Note  Patient Name: [name] Date of Encounter: 11/28/2016  Primary Cardiologist: Dr. Meda Coffee  Subjective   Feeling well. No chest pain, sob or palpitations. Ambulated well.   Inpatient Medications    Scheduled Meds: . aspirin  81 mg Oral Daily  . atorvastatin  80 mg Oral q1800  . clopidogrel  75 mg Oral Q breakfast  . dicyclomine  20 mg Oral TID AC  . famotidine  20 mg Oral QHS  . heparin  5,000 Units Subcutaneous Q8H  . losartan  100 mg Oral Daily  . metoprolol tartrate  12.5 mg Oral BID  . pantoprazole  40 mg Oral BID  . sodium chloride flush  3 mL Intravenous Q12H   Continuous Infusions: . sodium chloride 250 mL (11/27/16 1530)   PRN Meds: sodium chloride, acetaminophen, gi cocktail, ondansetron (ZOFRAN) IV, polyvinyl alcohol, promethazine, sodium chloride flush, traZODone   Vital Signs    Vitals:   11/27/16 2022 11/27/16 2135 11/28/16 0628 11/28/16 0750  BP: 109/62  118/71 127/74  Pulse: 73 72 61 99  Resp: 19  14 (!) 22  Temp: 98.1 F (36.7 C)  98.1 F (36.7 C) 97.7 F (36.5 C)  TempSrc: Oral  Oral Oral  SpO2: 99%  98% 99%  Weight:   227 lb 1.2 oz (103 kg)   Height:        Intake/Output Summary (Last 24 hours) at 11/28/16 0916 Last data filed at 11/28/16 0748  Gross per 24 hour  Intake              810 ml  Output             3375 ml  Net            -2565 ml   Filed Weights   11/26/16 1812 11/27/16 0512 11/28/16 0628  Weight: 229 lb (103.9 kg) 229 lb 3.2 oz (104 kg) 227 lb 1.2 oz (103 kg)    Telemetry    SR at rate of 70s- Personally Reviewed  ECG    SR with biphasic T wave in inferior leads- Personally Reviewed  Physical Exam   GEN: No acute distress.   Neck: No JVD Cardiac: RRR, no murmurs, rubs, or gallops. R radial cath site stable without hematoma  Respiratory: Clear to auscultation bilaterally. GI: Soft, nontender, non-distended  MS: No edema; No deformity. Neuro:  Nonfocal  Psych: Normal affect   Labs      Chemistry Recent Labs Lab 11/24/16 0931 11/26/16 1857 11/27/16 0123 11/28/16 0151  NA 140 137 138 137  K 4.0 3.7 3.7 4.0  CL 106 104 104 103  CO2 26 23 23 25   GLUCOSE 110* 116* 109* 102*  BUN 21 17 15 15   CREATININE 1.14 1.20 1.07 1.17  CALCIUM 9.1 9.1 9.0 8.7*  PROT 6.8  --   --   --   ALBUMIN 4.1  --   --   --   AST 20  --   --   --   ALT 14  --   --   --   ALKPHOS 40  --   --   --   BILITOT 0.5  --   --   --   GFRNONAA  --  >60 >60 >60  GFRAA  --  >60 >60 >60  ANIONGAP  --  10 11 9      Hematology Recent Labs Lab 11/26/16 1857 11/27/16 0756 11/28/16 0151  WBC 9.8 11.6* 9.3  RBC  4.89 4.75 4.61  HGB 15.8 14.9 14.6  HCT 45.2 44.0 42.8  MCV 92.4 92.6 92.8  MCH 32.3 31.4 31.7  MCHC 35.0 33.9 34.1  RDW 13.2 13.7 13.8  PLT 167 146* 153    Cardiac Enzymes Recent Labs Lab 11/26/16 1857 11/27/16 0123 11/27/16 0756 11/27/16 1224  TROPONINI 2.78* 8.91* 7.13* >65.00*   No results for input(s): TROPIPOC in the last 168 hours.    Radiology    Dg Chest 2 View  Result Date: 11/26/2016 CLINICAL DATA:  Acute onset of chest pain since this morning beneath left arm. Dizziness and hypertension. Smoker. EXAM: CHEST  2 VIEW COMPARISON:  10/29/2015 CXR, 10/19/2014 CT FINDINGS: The heart size and mediastinal contours are within normal limits. Both lungs are clear. Nodular density seen on remote CT is not radiographically apparent. The visualized skeletal structures are unremarkable with partially included right shoulder arthroplasty noted. IMPRESSION: No active cardiopulmonary disease. Electronically Signed   By: Ashley Royalty M.D.   On: 11/26/2016 19:24   Ct Angio Chest Aorta W And/or Wo Contrast  Result Date: 11/26/2016 CLINICAL DATA:  Acute onset of left-sided chest pain. Nausea and belching. Initial encounter. EXAM: CT ANGIOGRAPHY CHEST, ABDOMEN AND PELVIS TECHNIQUE: Multidetector CT imaging through the chest, abdomen and pelvis was performed using the standard protocol during  bolus administration of intravenous contrast. Multiplanar reconstructed images and MIPs were obtained and reviewed to evaluate the vascular anatomy. CONTRAST:  100 mL of Isovue 370 IV contrast COMPARISON:  Chest radiograph performed earlier today at 7:01 p.m., and CTA of the chest performed 10/19/2014 FINDINGS: CTA CHEST FINDINGS Cardiovascular: There is no evidence of aortic dissection. There is no evidence of aneurysmal dilatation. No calcific atherosclerotic disease is seen along the thoracic aorta. The great vessels are grossly unremarkable in appearance. There is no evidence of significant pulmonary embolus. The heart remains normal in size. Scattered coronary artery calcifications are seen. Mediastinum/Nodes: The mediastinum is unremarkable in appearance. No mediastinal lymphadenopathy is seen. No pericardial effusion is identified. A 3.7 cm left thyroid hypodensity is noted. A 1.6 cm right thyroid hypodensity is seen. These appear relatively stable from studies dating back to 2012, and are likely benign. No axillary lymphadenopathy is appreciated. Lungs/Pleura: Minimal bilateral dependent subsegmental atelectasis is noted. No pleural effusion or pneumothorax is seen. No masses are identified. Musculoskeletal: No acute osseous abnormalities are identified. The patient's right shoulder arthroplasty is incompletely imaged, but appears grossly unremarkable. The visualized musculature is unremarkable in appearance. Review of the MIP images confirms the above findings. CTA ABDOMEN AND PELVIS FINDINGS VASCULAR Aorta: There is no evidence of aortic dissection. There is no evidence of aneurysmal dilatation. Mild scattered calcification is seen along the abdominal aorta and its branches. Celiac: The celiac trunk appears fully patent. SMA: The superior mesenteric artery remains fully patent. Renals: The renal arteries appear patent bilaterally. Two right-sided renal arteries are seen. IMA: The inferior mesenteric artery  remains patent. Inflow: Scattered calcification is noted along the common and internal iliac arteries bilaterally, and along the common femoral arteries bilaterally. Veins: Visualized venous structures are grossly unremarkable, though difficult to fully assess given the phase of contrast enhancement. Review of the MIP images confirms the above findings. NON-VASCULAR Hepatobiliary: The liver is unremarkable in appearance. The gallbladder is unremarkable in appearance. The common bile duct remains normal in caliber. Pancreas: The pancreas is within normal limits. Spleen: The spleen is unremarkable in appearance. Adrenals/Urinary Tract: The adrenal glands are unremarkable in appearance. Scattered left renal cysts are  seen. There is no evidence of hydronephrosis. No renal or ureteral stones are identified. No perinephric stranding is seen. A 1.5 cm heterogeneous nodule at the lateral aspect of the left kidney is grossly stable from 2013 and likely benign. Stomach/Bowel: The stomach is unremarkable in appearance. The small bowel is within normal limits. The appendix is normal in caliber, without evidence of appendicitis. Mild scattered diverticulosis is noted along the descending colon, without evidence of diverticulitis. Lymphatic: No retroperitoneal or pelvic sidewall lymphadenopathy is seen. Reproductive: The bladder is decompressed and not well assessed. The prostate remains normal in size. Other: No additional soft tissue abnormalities are seen. Musculoskeletal: No acute osseous abnormalities are identified. The visualized musculature is unremarkable in appearance. Review of the MIP images confirms the above findings. IMPRESSION: 1. No evidence of aortic dissection. No evidence of aneurysmal dilatation. Mild scattered aortic atherosclerosis. 2. No evidence of significant pulmonary embolus. 3. Stable thyroid hypodensities are likely benign. 4. Minimal bilateral dependent subsegmental atelectasis noted. Lungs  otherwise clear. 5. Scattered left renal cysts. 6. 1.5 cm heterogeneous nodule at the lateral aspect of the left kidney is grossly stable from 2013 and likely benign. 7. Mild diverticulosis along the descending colon, without evidence of diverticulitis. Electronically Signed   By: Garald Balding M.D.   On: 11/26/2016 21:54   Ct Angio Abd/pel W And/or Wo Contrast  Result Date: 11/26/2016 CLINICAL DATA:  Acute onset of left-sided chest pain. Nausea and belching. Initial encounter. EXAM: CT ANGIOGRAPHY CHEST, ABDOMEN AND PELVIS TECHNIQUE: Multidetector CT imaging through the chest, abdomen and pelvis was performed using the standard protocol during bolus administration of intravenous contrast. Multiplanar reconstructed images and MIPs were obtained and reviewed to evaluate the vascular anatomy. CONTRAST:  100 mL of Isovue 370 IV contrast COMPARISON:  Chest radiograph performed earlier today at 7:01 p.m., and CTA of the chest performed 10/19/2014 FINDINGS: CTA CHEST FINDINGS Cardiovascular: There is no evidence of aortic dissection. There is no evidence of aneurysmal dilatation. No calcific atherosclerotic disease is seen along the thoracic aorta. The great vessels are grossly unremarkable in appearance. There is no evidence of significant pulmonary embolus. The heart remains normal in size. Scattered coronary artery calcifications are seen. Mediastinum/Nodes: The mediastinum is unremarkable in appearance. No mediastinal lymphadenopathy is seen. No pericardial effusion is identified. A 3.7 cm left thyroid hypodensity is noted. A 1.6 cm right thyroid hypodensity is seen. These appear relatively stable from studies dating back to 2012, and are likely benign. No axillary lymphadenopathy is appreciated. Lungs/Pleura: Minimal bilateral dependent subsegmental atelectasis is noted. No pleural effusion or pneumothorax is seen. No masses are identified. Musculoskeletal: No acute osseous abnormalities are identified. The  patient's right shoulder arthroplasty is incompletely imaged, but appears grossly unremarkable. The visualized musculature is unremarkable in appearance. Review of the MIP images confirms the above findings. CTA ABDOMEN AND PELVIS FINDINGS VASCULAR Aorta: There is no evidence of aortic dissection. There is no evidence of aneurysmal dilatation. Mild scattered calcification is seen along the abdominal aorta and its branches. Celiac: The celiac trunk appears fully patent. SMA: The superior mesenteric artery remains fully patent. Renals: The renal arteries appear patent bilaterally. Two right-sided renal arteries are seen. IMA: The inferior mesenteric artery remains patent. Inflow: Scattered calcification is noted along the common and internal iliac arteries bilaterally, and along the common femoral arteries bilaterally. Veins: Visualized venous structures are grossly unremarkable, though difficult to fully assess given the phase of contrast enhancement. Review of the MIP images confirms the above findings. NON-VASCULAR  Hepatobiliary: The liver is unremarkable in appearance. The gallbladder is unremarkable in appearance. The common bile duct remains normal in caliber. Pancreas: The pancreas is within normal limits. Spleen: The spleen is unremarkable in appearance. Adrenals/Urinary Tract: The adrenal glands are unremarkable in appearance. Scattered left renal cysts are seen. There is no evidence of hydronephrosis. No renal or ureteral stones are identified. No perinephric stranding is seen. A 1.5 cm heterogeneous nodule at the lateral aspect of the left kidney is grossly stable from 2013 and likely benign. Stomach/Bowel: The stomach is unremarkable in appearance. The small bowel is within normal limits. The appendix is normal in caliber, without evidence of appendicitis. Mild scattered diverticulosis is noted along the descending colon, without evidence of diverticulitis. Lymphatic: No retroperitoneal or pelvic sidewall  lymphadenopathy is seen. Reproductive: The bladder is decompressed and not well assessed. The prostate remains normal in size. Other: No additional soft tissue abnormalities are seen. Musculoskeletal: No acute osseous abnormalities are identified. The visualized musculature is unremarkable in appearance. Review of the MIP images confirms the above findings. IMPRESSION: 1. No evidence of aortic dissection. No evidence of aneurysmal dilatation. Mild scattered aortic atherosclerosis. 2. No evidence of significant pulmonary embolus. 3. Stable thyroid hypodensities are likely benign. 4. Minimal bilateral dependent subsegmental atelectasis noted. Lungs otherwise clear. 5. Scattered left renal cysts. 6. 1.5 cm heterogeneous nodule at the lateral aspect of the left kidney is grossly stable from 2013 and likely benign. 7. Mild diverticulosis along the descending colon, without evidence of diverticulitis. Electronically Signed   By: Garald Balding M.D.   On: 11/26/2016 21:54    Cardiac Studies   CORONARY STENT INTERVENTION  LEFT HEART CATH AND CORONARY ANGIOGRAPHY  Conclusion    Non-ST elevation myocardial infarction presentation with ongoing chest pain and rising troponin greater than 9.  Total occlusion of the mid circumflex with prolonged pain presentation due to the presence of collaterals.   Successful PCI of the mid circumflex total occlusion reducing the obstruction to less than 20% using a 3.0 Xience Alpine drug-eluting stent to restorie TIMI grade 3 flow. Final postdilatation balloon diameter was 3.5 [place] balloon.  Irregularities are noted in the LAD and right coronary but no significant obstructive lesions are seen.  Acute diastolic heart failure with LVEF 50%, and EDP 24 mmHg.  RECOMMENDATIONS:   Aspirin and Plavix for at least 6 months and preferably 12.  Aggressive risk factor modification.   Diagnostic Diagram       Post-Intervention Diagram          Patient Profile    56  year old  male with history of HTN, HLD, nonobstructive CAD by cath in August 2016 presents with NSTEMI.  Assessment & Plan    1. NSTEMI - troponin peaked > 65. EKG with minimal changes. CT negative for PE or dissection. Treated with IV heparin and IV nitro for ongoing chest pain. Cath showed total occlusion of mid circumflex s/p PTCA & DES. Irregularities are noted in the LAD and right coronary but no significant obstructive lesions are seen. Pending echo. Ambulated without chest pain with cardiac rehab.  - Continue ASA, plavix, statin, BB and ARB.   2. HTN.  - BP now stable on metoprolol 12.5mg  BID and Losartan 100mg  qd.   3. Hypercholesterolemia.  - 11/27/2016: Cholesterol 298; HDL 79; LDL Cholesterol 199; Triglycerides 98; VLDL 20. LDL goal less than 70. Continue lipitor 80mg  qd. Get lipid panel and LFTs in 6 weeks.   4. GERD. On protonix.  5. Tobacco use. Recommend complete smoking cessation.  6. Acute diastolic CHF - LVEF 81%, and EDP 24 mmHg by cath. Pending echo. Volume status stable.   Signed, Leanor Kail, PA  11/28/2016, 9:16 AM    Patient seen and examined and history reviewed. Agree with above findings and plan. Feeling much better today. Denies chest pain. Is really in shock that this happened to him. Radial site without hematoma. Given acute infarct with significant troponin elevation > 65 will keep on telemetry one more day. Metoprolol increased to 25 mg bid. Check Echo. Anticipate DC tomorrow.   Melvin Whiteford [place], [place] 11/28/2016 10:15 AM

## 2016-11-28 NOTE — Progress Notes (Signed)
Pt states he noticed his pain goes under his left arm when he raises his arm up (axilla area).  Dr Martinique on unit and informed. No new orders.

## 2016-11-28 NOTE — Progress Notes (Signed)
CARDIAC REHAB PHASE I   PRE:  Rate/Rhythm: 69 SR  BP:  Sitting: 127/74    MODE:  Ambulation: 500 ft   POST:  Rate/Rhythm: 73 SR  BP:  Sitting: 133/79       Pt ambulated 500 ft on RA, independent, steady gait, tolerated well.  Pt c/o mild DOE, denies cp, dizziness, declined rest stop. Completed MI/stent education.  Reviewed risk factors, tobacco cessation, anti-platelet therapy, stent card, activity restrictions, ntg, exercise, heart healthy diet and phase 2 cardiac rehab. Pt verbalized understanding, receptive to education. Pt agrees to phase 2 cardiac rehab referral, will send to [facility] per pt request. Pt to recliner after walk, call bell within reach.    [id number] Lenna Sciara, RN, BSN 11/28/2016 9:39 AM

## 2016-11-28 NOTE — Progress Notes (Signed)
Called to see patient for evaluation of recurrent chest pain. Patient reports 30 minutes of constant left precordial chest pain with associated numbness of left hand. Rates pain as 4/10. Similar quality to chest pain yesterday but not near the severity. Yesterday he was writhing in pain 10/10.   Appears comfortable. BP 100/60. Pulse NSR Lungs clear  CV RRR without gallop or rub.  Ecg shows NSR. T wave inversion in V6 only.   There is certainly some concern for early reocclusion of LCx stent but pain is not as severe as initially and I feel that if he reoccluded his pain would be significantly worse. Unfortunately Ecg is not very helpful since it did not show acute changes even with occlusion. Will treat with IV Morphine now. Monitor closely tonight. Repeat troponin which has been trending down. If pain worsens we may need to consider repeat angiography.  Peter Martinique MD, [facility]

## 2016-11-28 NOTE — Progress Notes (Signed)
  Echocardiogram 2D Echocardiogram has been performed.  [name] 11/28/2016, 3:07 PM

## 2016-11-28 NOTE — Progress Notes (Signed)
Cecilie Kicks, NP and Dr Martinique up on unit seeing pt, see their progress notes.

## 2016-11-28 NOTE — Progress Notes (Signed)
Pt pain level remains 4/10, same chest pressure. Pt given gi cocktail at this time. BP 106/73. Pt ate some of his dinner.

## 2016-11-28 NOTE — Progress Notes (Signed)
Cecilie Kicks, NP informed of pt still having chest pressure 4/10, requested for her to see him for reassessment. Informed given gi cocktail and pt had been able to eat dinner.

## 2016-11-28 NOTE — Care Management Note (Addendum)
Case Management Note  Patient Details  Name: [name] MRN: [id number] Date of Birth: 03-12-1961  Subjective/Objective:    From home with wife, pta indep, presents with NSTEMI, s/p stent,will be on plavix.  He has a PCP and medication coverage.              Action/Plan: NCM will follow for dc needs.   Expected Discharge Date:  11/30/16               Expected Discharge Plan:  Home/Self Care  In-House Referral:     Discharge planning Services  CM Consult  Post Acute Care Choice:    Choice offered to:     DME Arranged:    DME Agency:     HH Arranged:    HH Agency:     Status of Service:  Completed, signed off  If discussed at [employer] of Stay Meetings, dates discussed:    Additional Comments:  Zenon Mayo, RN 11/28/2016, 9:20 AM

## 2016-11-28 NOTE — Progress Notes (Addendum)
Patient describes his chest pain is dull and sore, radiates to his armpit, and his left fingers have paraesthesia intermittently.  The pain is primarily located on his left anterior chest, and is reproducible upon palpation.  After morphine administration patient states it was not helpful at all.  I informed the patient nitroglycerin is typically used for chest pain, he declines and states nitroglycerine makes his chest pain worse and he also states his doctor is aware of this.  He requests dilaudid and states he was given that previously and it is the only thing that helped his pain.  Jeffrey Him, MD on call for cardiology paged. Awaiting return call.

## 2016-11-28 NOTE — Progress Notes (Signed)
Still with 4/10 chest pain with occ sharp shooting pain.  To have troponin drawn shortly will discuss with Dr. Radford Pax.  Will give another 2 mg IV morphine with better BP.

## 2016-11-29 ENCOUNTER — Telehealth: Payer: Self-pay | Admitting: Cardiology

## 2016-11-29 ENCOUNTER — Telehealth: Payer: Self-pay

## 2016-11-29 LAB — ECHOCARDIOGRAM COMPLETE
Height: 70 in
Weight: 3633.18 oz

## 2016-11-29 LAB — TROPONIN I: Troponin I: 7.32 ng/mL (ref <0.03)

## 2016-11-29 MED ORDER — ISOSORBIDE MONONITRATE ER 30 MG PO TB24
30.0000 mg | ORAL_TABLET | Freq: Every day | ORAL | Status: DC
  Administered 2016-11-29 – 2016-11-30 (×2): 30 mg via ORAL
  Filled 2016-11-29 (×2): qty 1

## 2016-11-29 MED ORDER — IBUPROFEN 200 MG PO TABS
400.0000 mg | ORAL_TABLET | Freq: Three times a day (TID) | ORAL | Status: DC
  Administered 2016-11-29 – 2016-11-30 (×4): 400 mg via ORAL
  Filled 2016-11-29 (×4): qty 2

## 2016-11-29 NOTE — Progress Notes (Signed)
traci turner returned call; no new orders at this time.

## 2016-11-29 NOTE — Progress Notes (Signed)
Progress Note  Patient Name: [name] Date of Encounter: 11/29/2016  Primary Cardiologist: Dr. Meda Coffee  Subjective   Patient had recurrent chest pain last night. 4/10. Pain worse with movement of left arm/shoulder. Some numbness in left hand. Patient states he had pain off and on all night. Sometimes pulsating.   Inpatient Medications    Scheduled Meds: . aspirin  81 mg Oral Daily  . atorvastatin  80 mg Oral q1800  . clopidogrel  75 mg Oral Q breakfast  . dicyclomine  20 mg Oral TID AC  . famotidine  20 mg Oral QHS  . heparin  5,000 Units Subcutaneous Q8H  . ibuprofen  400 mg Oral TID  . isosorbide mononitrate  30 mg Oral Daily  . losartan  100 mg Oral Daily  . metoprolol tartrate  25 mg Oral BID  . pantoprazole  40 mg Oral BID  . sodium chloride flush  3 mL Intravenous Q12H   Continuous Infusions: . sodium chloride 250 mL (11/27/16 1530)   PRN Meds: sodium chloride, acetaminophen, gi cocktail, ondansetron (ZOFRAN) IV, polyvinyl alcohol, promethazine, sodium chloride flush, traZODone   Vital Signs    Vitals:   11/28/16 2221 11/28/16 2256 11/29/16 0512 11/29/16 0753  BP:  114/81 126/88 (!) 134/93  Pulse: 65 68 62 73  Resp:  15 16 14   Temp:  98 F (36.7 C) 98.1 F (36.7 C) 97.6 F (36.4 C)  TempSrc:  Oral Oral Oral  SpO2:  99% 96% 98%  Weight:   227 lb 1.2 oz (103 kg)   Height:        Intake/Output Summary (Last 24 hours) at 11/29/16 1120 Last data filed at 11/29/16 0755  Gross per 24 hour  Intake              720 ml  Output                0 ml  Net              720 ml   Filed Weights   11/27/16 0512 11/28/16 0628 11/29/16 0512  Weight: 229 lb 3.2 oz (104 kg) 227 lb 1.2 oz (103 kg) 227 lb 1.2 oz (103 kg)    Telemetry    SR at rate of 70s- Personally Reviewed  ECG    NSR with T wave flattening AVL and V6. I have personally reviewed and interpreted this study.   Physical Exam   GEN: No acute distress.   Neck: No JVD Cardiac: RRR, no  murmurs, rubs, or gallops. R radial cath site stable without hematoma  Chest wall with tenderness to palpation in left precordium Respiratory: Clear to auscultation bilaterally. GI: Soft, nontender, non-distended  MS: No edema; No deformity. Neuro:  Nonfocal  Psych: Normal affect   Labs    Chemistry  Recent Labs Lab 11/24/16 0931 11/26/16 1857 11/27/16 0123 11/28/16 0151  NA 140 137 138 137  K 4.0 3.7 3.7 4.0  CL 106 104 104 103  CO2 26 23 23 25   GLUCOSE 110* 116* 109* 102*  BUN 21 17 15 15   CREATININE 1.14 1.20 1.07 1.17  CALCIUM 9.1 9.1 9.0 8.7*  PROT 6.8  --   --   --   ALBUMIN 4.1  --   --   --   AST 20  --   --   --   ALT 14  --   --   --   ALKPHOS 40  --   --   --  BILITOT 0.5  --   --   --   GFRNONAA  --  >60 >60 >60  GFRAA  --  >60 >60 >60  ANIONGAP  --  10 11 9      Hematology  Recent Labs Lab 11/26/16 1857 11/27/16 0756 11/28/16 0151  WBC 9.8 11.6* 9.3  RBC 4.89 4.75 4.61  HGB 15.8 14.9 14.6  HCT 45.2 44.0 42.8  MCV 92.4 92.6 92.8  MCH 32.3 31.4 31.7  MCHC 35.0 33.9 34.1  RDW 13.2 13.7 13.8  PLT 167 146* 153    Cardiac Enzymes  Recent Labs Lab 11/27/16 1224 11/28/16 1229 11/28/16 1945 11/29/16 0353  TROPONINI >65.00* 6.60* 6.68* 7.32*   No results for input(s): TROPIPOC in the last 168 hours.    Radiology    No results found.  Cardiac Studies   CORONARY STENT INTERVENTION  LEFT HEART CATH AND CORONARY ANGIOGRAPHY  Conclusion    Non-ST elevation myocardial infarction presentation with ongoing chest pain and rising troponin greater than 9.  Total occlusion of the mid circumflex with prolonged pain presentation due to the presence of collaterals.   Successful PCI of the mid circumflex total occlusion reducing the obstruction to less than 20% using a 3.0 Xience Alpine drug-eluting stent to restorie TIMI grade 3 flow. Final postdilatation balloon diameter was 3.5 [place] balloon.  Irregularities are noted in the LAD and right  coronary but no significant obstructive lesions are seen.  Acute diastolic heart failure with LVEF 50%, and EDP 24 mmHg.  RECOMMENDATIONS:   Aspirin and Plavix for at least 6 months and preferably 12.  Aggressive risk factor modification.   Diagnostic Diagram       Post-Intervention Diagram          Patient Profile    56 year old  male with history of HTN, HLD, nonobstructive CAD by cath in August 2016 presents with NSTEMI.  Assessment & Plan    1. NSTEMI - troponin peaked > 65. EKG with recurrent chest pain showed only T wave inversion in V6. Ecg not very helpful since it had minimal changes even with LCx occlusion.   Cath showed total occlusion of mid circumflex s/p PTCA & DES. Irregularities are noted in the LAD and right coronary but no significant obstructive lesions are seen. I reviewed Echo from yesterday, the lateral wall is akinetic.  Troponin decreased to 6.6 then increased slightly to 7.3 last night.  - certainly there is some concern that he may have reocclusion with recurrent chest pain. Pain, however, was not nearly as intense as initial presentation and there is clearly a musculoskeletal component to it. Will repeat Ecg this am. I don't recommend repeat cardiac cath at this point since lateral wall akinetic.  - Continue ASA, plavix, statin, BB and ARB.  - will add Imdur 30 mg daily - add Ibuprofen for anti-inflammatory effects for musculoskeletal pain at least for short term. - ambulate more today. Anticipate DC tomorrow if stable.   2. HTN.  - BP now stable on metoprolol 12.5mg  BID and Losartan 100mg  qd. He is well beta blocked  3. Hypercholesterolemia.  - 11/27/2016: Cholesterol 298; HDL 79; LDL Cholesterol 199; Triglycerides 98; VLDL 20. LDL goal less than 70. Continue lipitor 80mg  qd. Get lipid panel and LFTs in 6 weeks.   4. GERD. On protonix.   5. Tobacco use. Recommend complete smoking cessation.  6. Acute diastolic CHF - LVEF 67%, and EDP 24 mmHg  by cath. Echo report pending. By my  assessment EF 45%.  Volume status stable.   Signed,   Peter Martinique, [place] 11/29/2016 11:20 AM

## 2016-11-29 NOTE — Telephone Encounter (Signed)
New Message    TCM appt 12/07/16 1030a  Dayna Dunn Per Cephus Shelling B

## 2016-11-29 NOTE — Telephone Encounter (Signed)
1st TCM Call  Pt is still in the hospital and does not know when he will be released.

## 2016-11-29 NOTE — Telephone Encounter (Signed)
Pt states that he is in the hospital at this time.

## 2016-11-30 ENCOUNTER — Encounter (HOSPITAL_COMMUNITY): Payer: Self-pay | Admitting: Physician Assistant

## 2016-11-30 LAB — TROPONIN I: Troponin I: 3.6 ng/mL (ref <0.03)

## 2016-11-30 MED ORDER — CLOPIDOGREL BISULFATE 75 MG PO TABS: 75.0000 mg | ORAL_TABLET | Freq: Every day | ORAL | 11 refills | Status: DC

## 2016-11-30 MED ORDER — PANTOPRAZOLE SODIUM 40 MG PO TBEC: 40.0000 mg | DELAYED_RELEASE_TABLET | Freq: Every day | ORAL | 6 refills | Status: DC

## 2016-11-30 MED ORDER — ATORVASTATIN CALCIUM 80 MG PO TABS: 80.0000 mg | ORAL_TABLET | Freq: Every day | ORAL | 6 refills | Status: DC

## 2016-11-30 MED ORDER — ACTIVE PARTNERSHIP FOR HEALTH OF YOUR HEART BOOK
Freq: Once | Status: AC
  Administered 2016-11-30: 1
  Filled 2016-11-30: qty 1

## 2016-11-30 MED ORDER — ISOSORBIDE MONONITRATE ER 30 MG PO TB24: 30.0000 mg | ORAL_TABLET | Freq: Every day | ORAL | 6 refills | Status: DC

## 2016-11-30 MED ORDER — METOPROLOL TARTRATE 25 MG PO TABS
25.0000 mg | ORAL_TABLET | Freq: Two times a day (BID) | ORAL | 6 refills | Status: DC
Start: 2016-11-30 — End: 2017-02-05

## 2016-11-30 NOTE — Progress Notes (Signed)
Progress Note  Patient Name: [name] Date of Encounter: 11/30/2016  Primary Cardiologist: Dr. Meda Coffee   Subjective   Ambulated last night without angina however mild chest discomfort afterwards. Feeling good.   Inpatient Medications    Scheduled Meds: . aspirin  81 mg Oral Daily  . atorvastatin  80 mg Oral q1800  . clopidogrel  75 mg Oral Q breakfast  . dicyclomine  20 mg Oral TID AC  . famotidine  20 mg Oral QHS  . heparin  5,000 Units Subcutaneous Q8H  . ibuprofen  400 mg Oral TID WC  . isosorbide mononitrate  30 mg Oral Daily  . losartan  100 mg Oral Daily  . metoprolol tartrate  25 mg Oral BID  . pantoprazole  40 mg Oral BID  . sodium chloride flush  3 mL Intravenous Q12H   Continuous Infusions: . sodium chloride 250 mL (11/27/16 1530)   PRN Meds: sodium chloride, acetaminophen, gi cocktail, ondansetron (ZOFRAN) IV, polyvinyl alcohol, promethazine, sodium chloride flush, traZODone   Vital Signs    Vitals:   11/29/16 1951 11/29/16 2145 11/29/16 2320 11/30/16 0631  BP: 110/74 (!) 121/96 118/81 103/66  Pulse: 65 71 60 (!) 58  Resp: 13 19 18 14   Temp: 97.6 F (36.4 C)  98 F (36.7 C) 97.6 F (36.4 C)  TempSrc: Oral  Oral Oral  SpO2: 98% 95% 96% 99%  Weight:    231 lb 7.7 oz (105 kg)  Height:        Intake/Output Summary (Last 24 hours) at 11/30/16 0815 Last data filed at 11/29/16 2210  Gross per 24 hour  Intake              483 ml  Output                0 ml  Net              483 ml   Filed Weights   11/28/16 0628 11/29/16 0512 11/30/16 0631  Weight: 227 lb 1.2 oz (103 kg) 227 lb 1.2 oz (103 kg) 231 lb 7.7 oz (105 kg)    Telemetry    NSR in 70s - Personally Reviewed  ECG    None today   Physical Exam   GEN: No acute distress.   Neck: No JVD Cardiac: RRR, no murmurs, rubs, or gallops. R radial cath site without hematoma.  Respiratory: Clear to auscultation bilaterally. GI: Soft, nontender, non-distended  MS: No edema; No  deformity. Neuro:  Nonfocal  Psych: Normal affect   Labs    Chemistry Recent Labs Lab 11/24/16 0931 11/26/16 1857 11/27/16 0123 11/28/16 0151  NA 140 137 138 137  K 4.0 3.7 3.7 4.0  CL 106 104 104 103  CO2 26 23 23 25   GLUCOSE 110* 116* 109* 102*  BUN 21 17 15 15   CREATININE 1.14 1.20 1.07 1.17  CALCIUM 9.1 9.1 9.0 8.7*  PROT 6.8  --   --   --   ALBUMIN 4.1  --   --   --   AST 20  --   --   --   ALT 14  --   --   --   ALKPHOS 40  --   --   --   BILITOT 0.5  --   --   --   GFRNONAA  --  >60 >60 >60  GFRAA  --  >60 >60 >60  ANIONGAP  --  10 11 9  Hematology Recent Labs Lab 11/26/16 1857 11/27/16 0756 11/28/16 0151  WBC 9.8 11.6* 9.3  RBC 4.89 4.75 4.61  HGB 15.8 14.9 14.6  HCT 45.2 44.0 42.8  MCV 92.4 92.6 92.8  MCH 32.3 31.4 31.7  MCHC 35.0 33.9 34.1  RDW 13.2 13.7 13.8  PLT 167 146* 153    Cardiac Enzymes Recent Labs Lab 11/27/16 1224 11/28/16 1229 11/28/16 1945 11/29/16 0353  TROPONINI >65.00* 6.60* 6.68* 7.32*   No results for input(s): TROPIPOC in the last 168 hours.   BNPNo results for input(s): BNP, PROBNP in the last 168 hours.   DDimer No results for input(s): DDIMER in the last 168 hours.   Radiology    No results found.  Cardiac Studies   CORONARY STENT INTERVENTION  LEFT HEART CATH AND CORONARY ANGIOGRAPHY  Conclusion    Non-ST elevation myocardial infarction presentation with ongoing chest pain and rising troponin greater than 9.  Total occlusion of the mid circumflex with prolonged pain presentation due to the presence of collaterals.   Successful PCI of the mid circumflex total occlusion reducing the obstruction to less than 20% using a 3.0 Xience Alpine drug-eluting stent to restorie TIMI grade 3 flow. Final postdilatation balloon diameter was 3.5 [place] balloon.  Irregularities are noted in the LAD and right coronary but no significant obstructive lesions are seen.  Acute diastolic heart failure with LVEF 50%, and EDP 24  mmHg.  RECOMMENDATIONS:   Aspirin and Plavix for at least 6 months and preferably 12.  Aggressive risk factor modification.   Diagnostic Diagram       Post-Intervention Diagram         Echo 11/28/16 Study Conclusions  - Left ventricle: The cavity size was normal. There was moderate   concentric hypertrophy. Systolic function was mildly reduced. The   estimated ejection fraction was in the range of 45% to 50%. There   is hypokinesis of the basal-midlateral, inferolateral, and   inferior myocardium. - Right ventricle: The cavity size was mildly dilated. Wall   thickness was normal. - Right atrium: The atrium was mildly to moderately dilated. - Atrial septum: There was increased thickness of the septum,   consistent with lipomatous hypertrophy. - Pulmonic valve: There was trivial regurgitation. - Pulmonary arteries: Systolic pressure could not be accurately   estimated.    Patient Profile     56 y.o. male with hx of HTN, HLD presented with NSTEMI.   Assessment & Plan    1. NSTEMI - Peak of troponin > 65.  EKG with recurrent chest pain showed only T wave inversion in V6. Cath showed total occlusion of mid circumflex s/p PTCA & DES. Irregularities are noted in the LAD and right coronary but no significant obstructive lesions are seen. POST PCI troponin trended down to 6.6 then gradually increase to 7.32 with intermittent chest pain. Now chest pain free. Ambulated without significant discomfort yesterday. Echo showed LVEF of 45-50%. There  is hypokinesis of the basal-midlateral, inferolateral, and  inferior myocardium. Lipomatous hypertrophy of atrial septum.  - Continue ASA, Plavix, statin, imdur, BB and ARB.   2. HTN - BP stable on current medications  3. HLD - 11/27/2016: Cholesterol 298; HDL 79; LDL Cholesterol 199; Triglycerides 98; VLDL 20  - Continue lipitor. LFT and lipid panel in 6 weeks.   4. Tobacco abuse - recommended smoking cessation. Education  given.   5. Acute diastolic CHF - LVEF 32%, and EDP 24 mmHg by cath. Echo showed LVEF of 45-50%.  Euvolemic.   Signed, Leanor Kail, PA  11/30/2016, 8:15 AM    Patient seen and examined and history reviewed. Agree with above findings and plan. Feeling much better today. Pain improved. Ready to go home today. Reviewed instructions for meds and activity. Will DC today.  Peter Martinique, [place] 11/30/2016 8:49 AM

## 2016-11-30 NOTE — Progress Notes (Signed)
CARDIAC REHAB PHASE I   PRE:  Rate/Rhythm:  45 SR  BP:  Sitting: 127/81    MODE:  Ambulation: 1000 ft   POST:  Rate/Rhythm: 76 SR  BP:  Sitting: 140/82         SaO2: 100 RA  Pt ambulated 1000 ft on RA, independent, slow but steady gait, tolerated well. Pt c/o 3/10 chest "discomfort," mild DOE, denies any other complaints, declined rest stop. Answered pt's questions regarding education. Pt up ad lib in room, call bell within reach. Pt hopeful for discharge today.   [id number] Lenna Sciara, RN, BSN 11/30/2016 8:42 AM

## 2016-11-30 NOTE — Discharge Summary (Signed)
Discharge Summary    Patient ID: [name],  MRN: [id number], DOB/AGE: [birth year] 56 y.o.  Admit date: 11/26/2016 Discharge date: 11/30/2016  Primary Care Provider: [name] Primary Cardiologist: Dr. Meda Coffee   Discharge Diagnoses    Principal Problem:   NSTEMI (non-ST elevated myocardial infarction) [facility]) Active Problems:   Essential hypertension   Tobacco use   Hypercholesteremia   Acute diastolic CHF  Allergies No Known Allergies  Diagnostic Studies/Procedures    CORONARY STENT INTERVENTION  LEFT HEART CATH AND CORONARY ANGIOGRAPHY  Conclusion    Non-ST elevation myocardial infarction presentation with ongoing chest pain and rising troponin greater than 9.  Total occlusion of the mid circumflex with prolonged pain presentation due to the presence of collaterals.   Successful PCI of the mid circumflex total occlusion reducing the obstruction to less than 20% using a 3.0 Xience Alpine drug-eluting stent to restorie TIMI grade 3 flow. Final postdilatation balloon diameter was 3.5 [place] balloon.  Irregularities are noted in the LAD and right coronary but no significant obstructive lesions are seen.  Acute diastolic heart failure with LVEF 50%, and EDP 24 mmHg.  RECOMMENDATIONS:   Aspirin and Plavix for at least 6 months and preferably 12.  Aggressive risk factor modification.   Diagnostic Diagram       Post-Intervention Diagram         Echo 11/28/16 Study Conclusions  - Left ventricle: The cavity size was normal. There was moderate concentric hypertrophy. Systolic function was mildly reduced. The estimated ejection fraction was in the range of 45% to 50%. There is hypokinesis of the basal-midlateral, inferolateral, and inferior myocardium. - Right ventricle: The cavity size was mildly dilated. Wall thickness was normal. - Right atrium: The atrium was mildly to moderately dilated. - Atrial septum: There was  increased thickness of the septum, consistent with lipomatous hypertrophy. - Pulmonic valve: There was trivial regurgitation. - Pulmonary arteries: Systolic pressure could not be accurately estimated.     History of Present Illness     *[name] is a 56 yo man with PMH of CAD, HTN, HLD, tobacco abuse and GERD who was transferred to [facility] from [facility] ER for NSTEMI 11/27/16. The patient reports that 2 days ago (Friday) he felt poorly, though it was his GERD. Took additional doses of his medications and felt better on Saturday. Yesterday (Sunday) he got home from work around noon and felt chest discomfort. He went to mow his grass, and he developed dyspnea on exertion and left sided chest pain radiating to his left shoulder. He has never had pain like this before. He had nausea without emesis, but he also suffers from chronic nausea and is unsure if this was different. Denies melena, hematochezia, hematemesis. Is pending outpatient endoscopy in about a month.  In the ER, he was noted to have an elevated troponin. He continued to have chest pain, which was worse with SL NG and better with fentanyl. CTA was negative for dissection or PE. He received 324 mg aspirin and was started on a heparin drip prior to transfer.   Hospital Course     Consultants: None  1. NSTEMI - Peak of troponin > 65.  EKG with recurrent chest pain showed only T wave inversion in V6. Cath showed total occlusion of mid circumflex s/p PTCA &DES. Irregularities are noted in the LAD and right coronary but no significant obstructive lesions are seen. POST PCI troponin trended down to 6.6 then gradually increase to 7.32 with  intermittent chest pain post cath. Pain gradually improved. Pain medications seeking behavior. Ambulated without significant discomfort eventually and then resolved. Echo showed LVEF of 45-50%. There is hypokinesis of the basal-midlateral, inferolateral, and inferior myocardium. Lipomatous hypertrophy  of atrial septum.  - Continue ASA, Plavix, statin, imdur, BB and ARB.   2. HTN - BP stable on current medications  3. HLD - 11/27/2016: Cholesterol 298; HDL 79; LDL Cholesterol 199; Triglycerides 98; VLDL 20  - Continue lipitor. LFT and lipid panel in 6 weeks.   4. Tobacco abuse - recommended smoking cessation. Education given.   5. Acute diastolic CHF - LVEF 57%, and EDP 24 mmHg by cath. Echo showed LVEF of 45-50%.  Euvolemic. Discussed low sodium diet.   The patient has been seen by Dr. Martinique  today and deemed ready for discharge home. All follow-up appointments have been scheduled. Discharge medications are listed below.  _____________  Discharge Vitals Blood pressure 127/81, pulse 68, temperature 97.8 F (36.6 C), temperature source Oral, resp. rate (!) 24, height 5\' 10"  (1.778 m), weight 231 lb 7.7 oz (105 kg), SpO2 97 %.  Filed Weights   11/28/16 0628 11/29/16 0512 11/30/16 0631  Weight: 227 lb 1.2 oz (103 kg) 227 lb 1.2 oz (103 kg) 231 lb 7.7 oz (105 kg)    Labs & Radiologic Studies     CBC  Recent Labs  11/28/16 0151  WBC 9.3  HGB 14.6  HCT 42.8  MCV 92.8  PLT 846   Basic Metabolic Panel  Recent Labs  11/28/16 0151  NA 137  K 4.0  CL 103  CO2 25  GLUCOSE 102*  BUN 15  CREATININE 1.17  CALCIUM 8.7*   Liver Function Tests No results for input(s): AST, ALT, ALKPHOS, BILITOT, PROT, ALBUMIN in the last 72 hours. No results for input(s): LIPASE, AMYLASE in the last 72 hours. Cardiac Enzymes  Recent Labs  11/28/16 1229 11/28/16 1945 11/29/16 0353  TROPONINI 6.60* 6.68* 7.32*     Dg Chest 2 View  Result Date: 11/26/2016 CLINICAL DATA:  Acute onset of chest pain since this morning beneath left arm. Dizziness and hypertension. Smoker. EXAM: CHEST  2 VIEW COMPARISON:  10/29/2015 CXR, 10/19/2014 CT FINDINGS: The heart size and mediastinal contours are within normal limits. Both lungs are clear. Nodular density seen on remote CT is not  radiographically apparent. The visualized skeletal structures are unremarkable with partially included right shoulder arthroplasty noted. IMPRESSION: No active cardiopulmonary disease. Electronically Signed   By: Ashley Royalty M.D.   On: 11/26/2016 19:24   Ct Angio Chest Aorta W And/or Wo Contrast  Result Date: 11/26/2016 CLINICAL DATA:  Acute onset of left-sided chest pain. Nausea and belching. Initial encounter. EXAM: CT ANGIOGRAPHY CHEST, ABDOMEN AND PELVIS TECHNIQUE: Multidetector CT imaging through the chest, abdomen and pelvis was performed using the standard protocol during bolus administration of intravenous contrast. Multiplanar reconstructed images and MIPs were obtained and reviewed to evaluate the vascular anatomy. CONTRAST:  100 mL of Isovue 370 IV contrast COMPARISON:  Chest radiograph performed earlier today at 7:01 p.m., and CTA of the chest performed 10/19/2014 FINDINGS: CTA CHEST FINDINGS Cardiovascular: There is no evidence of aortic dissection. There is no evidence of aneurysmal dilatation. No calcific atherosclerotic disease is seen along the thoracic aorta. The great vessels are grossly unremarkable in appearance. There is no evidence of significant pulmonary embolus. The heart remains normal in size. Scattered coronary artery calcifications are seen. Mediastinum/Nodes: The mediastinum is unremarkable in appearance. No mediastinal  lymphadenopathy is seen. No pericardial effusion is identified. A 3.7 cm left thyroid hypodensity is noted. A 1.6 cm right thyroid hypodensity is seen. These appear relatively stable from studies dating back to 2012, and are likely benign. No axillary lymphadenopathy is appreciated. Lungs/Pleura: Minimal bilateral dependent subsegmental atelectasis is noted. No pleural effusion or pneumothorax is seen. No masses are identified. Musculoskeletal: No acute osseous abnormalities are identified. The patient's right shoulder arthroplasty is incompletely imaged, but appears  grossly unremarkable. The visualized musculature is unremarkable in appearance. Review of the MIP images confirms the above findings. CTA ABDOMEN AND PELVIS FINDINGS VASCULAR Aorta: There is no evidence of aortic dissection. There is no evidence of aneurysmal dilatation. Mild scattered calcification is seen along the abdominal aorta and its branches. Celiac: The celiac trunk appears fully patent. SMA: The superior mesenteric artery remains fully patent. Renals: The renal arteries appear patent bilaterally. Two right-sided renal arteries are seen. IMA: The inferior mesenteric artery remains patent. Inflow: Scattered calcification is noted along the common and internal iliac arteries bilaterally, and along the common femoral arteries bilaterally. Veins: Visualized venous structures are grossly unremarkable, though difficult to fully assess given the phase of contrast enhancement. Review of the MIP images confirms the above findings. NON-VASCULAR Hepatobiliary: The liver is unremarkable in appearance. The gallbladder is unremarkable in appearance. The common bile duct remains normal in caliber. Pancreas: The pancreas is within normal limits. Spleen: The spleen is unremarkable in appearance. Adrenals/Urinary Tract: The adrenal glands are unremarkable in appearance. Scattered left renal cysts are seen. There is no evidence of hydronephrosis. No renal or ureteral stones are identified. No perinephric stranding is seen. A 1.5 cm heterogeneous nodule at the lateral aspect of the left kidney is grossly stable from 2013 and likely benign. Stomach/Bowel: The stomach is unremarkable in appearance. The small bowel is within normal limits. The appendix is normal in caliber, without evidence of appendicitis. Mild scattered diverticulosis is noted along the descending colon, without evidence of diverticulitis. Lymphatic: No retroperitoneal or pelvic sidewall lymphadenopathy is seen. Reproductive: The bladder is decompressed and not  well assessed. The prostate remains normal in size. Other: No additional soft tissue abnormalities are seen. Musculoskeletal: No acute osseous abnormalities are identified. The visualized musculature is unremarkable in appearance. Review of the MIP images confirms the above findings. IMPRESSION: 1. No evidence of aortic dissection. No evidence of aneurysmal dilatation. Mild scattered aortic atherosclerosis. 2. No evidence of significant pulmonary embolus. 3. Stable thyroid hypodensities are likely benign. 4. Minimal bilateral dependent subsegmental atelectasis noted. Lungs otherwise clear. 5. Scattered left renal cysts. 6. 1.5 cm heterogeneous nodule at the lateral aspect of the left kidney is grossly stable from 2013 and likely benign. 7. Mild diverticulosis along the descending colon, without evidence of diverticulitis. Electronically Signed   By: Garald Balding M.D.   On: 11/26/2016 21:54   Ct Angio Abd/pel W And/or Wo Contrast  Result Date: 11/26/2016 CLINICAL DATA:  Acute onset of left-sided chest pain. Nausea and belching. Initial encounter. EXAM: CT ANGIOGRAPHY CHEST, ABDOMEN AND PELVIS TECHNIQUE: Multidetector CT imaging through the chest, abdomen and pelvis was performed using the standard protocol during bolus administration of intravenous contrast. Multiplanar reconstructed images and MIPs were obtained and reviewed to evaluate the vascular anatomy. CONTRAST:  100 mL of Isovue 370 IV contrast COMPARISON:  Chest radiograph performed earlier today at 7:01 p.m., and CTA of the chest performed 10/19/2014 FINDINGS: CTA CHEST FINDINGS Cardiovascular: There is no evidence of aortic dissection. There is no evidence of  aneurysmal dilatation. No calcific atherosclerotic disease is seen along the thoracic aorta. The great vessels are grossly unremarkable in appearance. There is no evidence of significant pulmonary embolus. The heart remains normal in size. Scattered coronary artery calcifications are seen.  Mediastinum/Nodes: The mediastinum is unremarkable in appearance. No mediastinal lymphadenopathy is seen. No pericardial effusion is identified. A 3.7 cm left thyroid hypodensity is noted. A 1.6 cm right thyroid hypodensity is seen. These appear relatively stable from studies dating back to 2012, and are likely benign. No axillary lymphadenopathy is appreciated. Lungs/Pleura: Minimal bilateral dependent subsegmental atelectasis is noted. No pleural effusion or pneumothorax is seen. No masses are identified. Musculoskeletal: No acute osseous abnormalities are identified. The patient's right shoulder arthroplasty is incompletely imaged, but appears grossly unremarkable. The visualized musculature is unremarkable in appearance. Review of the MIP images confirms the above findings. CTA ABDOMEN AND PELVIS FINDINGS VASCULAR Aorta: There is no evidence of aortic dissection. There is no evidence of aneurysmal dilatation. Mild scattered calcification is seen along the abdominal aorta and its branches. Celiac: The celiac trunk appears fully patent. SMA: The superior mesenteric artery remains fully patent. Renals: The renal arteries appear patent bilaterally. Two right-sided renal arteries are seen. IMA: The inferior mesenteric artery remains patent. Inflow: Scattered calcification is noted along the common and internal iliac arteries bilaterally, and along the common femoral arteries bilaterally. Veins: Visualized venous structures are grossly unremarkable, though difficult to fully assess given the phase of contrast enhancement. Review of the MIP images confirms the above findings. NON-VASCULAR Hepatobiliary: The liver is unremarkable in appearance. The gallbladder is unremarkable in appearance. The common bile duct remains normal in caliber. Pancreas: The pancreas is within normal limits. Spleen: The spleen is unremarkable in appearance. Adrenals/Urinary Tract: The adrenal glands are unremarkable in appearance. Scattered  left renal cysts are seen. There is no evidence of hydronephrosis. No renal or ureteral stones are identified. No perinephric stranding is seen. A 1.5 cm heterogeneous nodule at the lateral aspect of the left kidney is grossly stable from 2013 and likely benign. Stomach/Bowel: The stomach is unremarkable in appearance. The small bowel is within normal limits. The appendix is normal in caliber, without evidence of appendicitis. Mild scattered diverticulosis is noted along the descending colon, without evidence of diverticulitis. Lymphatic: No retroperitoneal or pelvic sidewall lymphadenopathy is seen. Reproductive: The bladder is decompressed and not well assessed. The prostate remains normal in size. Other: No additional soft tissue abnormalities are seen. Musculoskeletal: No acute osseous abnormalities are identified. The visualized musculature is unremarkable in appearance. Review of the MIP images confirms the above findings. IMPRESSION: 1. No evidence of aortic dissection. No evidence of aneurysmal dilatation. Mild scattered aortic atherosclerosis. 2. No evidence of significant pulmonary embolus. 3. Stable thyroid hypodensities are likely benign. 4. Minimal bilateral dependent subsegmental atelectasis noted. Lungs otherwise clear. 5. Scattered left renal cysts. 6. 1.5 cm heterogeneous nodule at the lateral aspect of the left kidney is grossly stable from 2013 and likely benign. 7. Mild diverticulosis along the descending colon, without evidence of diverticulitis. Electronically Signed   By: Garald Balding M.D.   On: 11/26/2016 21:54    Disposition   Pt is being discharged home today in good condition.  Follow-up Plans & Appointments    Follow-up Information    [name], PA-C. Go on 12/07/2016.   Specialties:  Cardiology, Radiology Why:  @10 :30am  for TCM Contact information: [address] [place] [address] [phone]  Discharge Instructions    Amb  Referral to Cardiac Rehabilitation    Complete by:  As directed    Diagnosis:   Coronary Stents Comment - to [place] NSTEMI     Diet - low sodium heart healthy    Complete by:  As directed    Discharge instructions    Complete by:  As directed    NO HEAVY LIFTING (>10lbs) X 2 WEEKS. NO SEXUAL ACTIVITY X 2 WEEKS. NO DRIVING X 1 WEEK. NO SOAKING BATHS, HOT TUBS, POOLS, ETC., X 7 DAYS.  Some studies suggest Prilosec/Omeprazole & Pravacid interacts with Plavix. We changed yourPravacid to Protonix for less chance of interaction.   Increase activity slowly    Complete by:  As directed       Discharge Medications   Current Discharge Medication List    START taking these medications   Details  clopidogrel (PLAVIX) 75 MG tablet Take 1 tablet (75 mg total) by mouth daily with breakfast. Qty: 30 tablet, Refills: 11    isosorbide mononitrate (IMDUR) 30 MG 24 hr tablet Take 1 tablet (30 mg total) by mouth daily. Qty: 30 tablet, Refills: 6    metoprolol tartrate (LOPRESSOR) 25 MG tablet Take 1 tablet (25 mg total) by mouth 2 (two) times daily. Qty: 60 tablet, Refills: 6    pantoprazole (PROTONIX) 40 MG tablet Take 1 tablet (40 mg total) by mouth daily. Qty: 30 tablet, Refills: 6      CONTINUE these medications which have CHANGED   Details  atorvastatin (LIPITOR) 80 MG tablet Take 1 tablet (80 mg total) by mouth at bedtime. Qty: 30 tablet, Refills: 6      CONTINUE these medications which have NOT CHANGED   Details  aspirin EC 81 MG tablet Take 81 mg by mouth daily.    Cetirizine-Pseudoephedrine (ZYRTEC-D PO) Take 1 tablet by mouth daily.    chlorhexidine (PERIDEX) 0.12 % solution Use as directed 10 mLs in the mouth or throat 2 (two) times daily. As directed.    dicyclomine (BENTYL) 20 MG tablet Take 1 tablet (20 mg total) by mouth 3 (three) times daily before meals. Qty: 60 tablet, Refills: 1   Associated Diagnoses: Abdominal cramping    loperamide (IMODIUM A-D) 2 MG  tablet Take 4 mg by mouth 4 (four) times daily as needed for diarrhea or loose stools.    losartan (COZAAR) 100 MG tablet Take 1 tablet (100 mg total) by mouth daily. Qty: 30 tablet, Refills: 3    ondansetron (ZOFRAN) 8 MG tablet Take by mouth 2 (two) times daily.    promethazine (PHENERGAN) 12.5 MG tablet Take 1/2 -1 tablet by mouth three times a day as needed Qty: 90 tablet, Refills: 1    psyllium (HYDROCIL/METAMUCIL) 95 % PACK Take 1 packet by mouth daily.    ranitidine (ZANTAC) 150 MG tablet Take 1 tablet (150 mg total) by mouth at bedtime. Qty: 30 tablet, Refills: 5    traZODone (DESYREL) 50 MG tablet Take 0.5-1 tablets (25-50 mg total) by mouth at bedtime as needed for sleep. Qty: 30 tablet, Refills: 3      STOP taking these medications     lansoprazole (PREVACID) 30 MG capsule          Aspirin prescribed at discharge?  Yes High Intensity Statin Prescribed? (Lipitor 40-80mg  or Crestor 20-40mg ): Yes Beta Blocker Prescribed? Yes For EF 45% or less, Was ACEI/ARB Prescribed? Yes ADP Receptor Inhibitor Prescribed? (i.e. Plavix etc.-Includes Medically Managed Patients): Yes For EF <40%,  Aldosterone Inhibitor Prescribed? N/A Was EF assessed during THIS hospitalization? Yes Was Cardiac Rehab II ordered? (Included Medically managed Patients): Yes   Outstanding Labs/Studies   Consider OP f/u labs 6-8 weeks given statin initiation this admission.  Duration of Discharge Encounter   Greater than 30 minutes including physician time.  Signed, Javona Bergevin PA-C 11/30/2016, 9:27 AM

## 2016-12-04 ENCOUNTER — Telehealth: Payer: Self-pay | Admitting: Physician Assistant

## 2016-12-04 NOTE — Telephone Encounter (Signed)
Called patient back about his message. Patient's note has him going back to work on 12/06/16, and patient's office visit in 12/07/16. Informed patient if he felt like he needed to wait for his office visit (OV) to be cleared to return to work, that he could wait until his OV with Melina Copa PA. Patient stated he has a stressful job and he is on his feet most of the shift. Patient seemed concerned about returning before his OV, encouraged patient to wait for his OV. Patient verbalized understanding.

## 2016-12-04 NOTE — Telephone Encounter (Signed)
New message    Pt is calling about his note to work. He said it says he can go back to work before his follow up, but he wants to make sure this is correct.

## 2016-12-06 ENCOUNTER — Encounter: Payer: Self-pay | Admitting: Family Medicine

## 2016-12-06 ENCOUNTER — Ambulatory Visit (INDEPENDENT_AMBULATORY_CARE_PROVIDER_SITE_OTHER): Payer: BLUE CROSS/BLUE SHIELD | Admitting: Family Medicine

## 2016-12-06 VITALS — BP 110/80 | HR 72 | Temp 97.9°F | Ht 69.0 in | Wt 230.2 lb

## 2016-12-06 DIAGNOSIS — Z09 Encounter for follow-up examination after completed treatment for conditions other than malignant neoplasm: Secondary | ICD-10-CM

## 2016-12-06 DIAGNOSIS — I214 Non-ST elevation (NSTEMI) myocardial infarction: Secondary | ICD-10-CM

## 2016-12-06 LAB — COMPREHENSIVE METABOLIC PANEL
ALT: 22 U/L (ref 0–53)
AST: 18 U/L (ref 0–37)
Albumin: 4.2 g/dL (ref 3.5–5.2)
Alkaline Phosphatase: 41 U/L (ref 39–117)
BUN: 20 mg/dL (ref 6–23)
CO2: 24 mEq/L (ref 19–32)
Calcium: 8.9 mg/dL (ref 8.4–10.5)
Chloride: 106 mEq/L (ref 96–112)
Creatinine, Ser: 1.08 mg/dL (ref 0.40–1.50)
GFR: 91.74 mL/min (ref >60.00)
Glucose, Bld: 112 mg/dL — ABNORMAL HIGH (ref 70–99)
Potassium: 4 mEq/L (ref 3.5–5.1)
Sodium: 138 mEq/L (ref 135–145)
Total Bilirubin: 0.4 mg/dL (ref 0.2–1.2)
Total Protein: 6.6 g/dL (ref 6.0–8.3)

## 2016-12-06 MED ORDER — LOSARTAN POTASSIUM 100 MG PO TABS: 100.0000 mg | ORAL_TABLET | Freq: Every day | ORAL | 1 refills | Status: DC

## 2016-12-06 MED ORDER — NITROGLYCERIN 0.4 MG SL SUBL: 0.4000 mg | SUBLINGUAL_TABLET | SUBLINGUAL | 1 refills | Status: DC | PRN

## 2016-12-06 NOTE — Progress Notes (Addendum)
Chief Complaint  Patient presents with  . Hospitalization Follow-up    for Heart Attack    HPI [name] is a 56 y.o. y.o. male who presents for a transition of care visit.  Pt was discharged from [facility] on 11/30/16.  Within 48 business hours of discharge our office contacted him via telephone to coordinate his care and needs.   Admitted for NSTEMI, received one stent. On dual anti-platelets. Tolerating well. He did not lose much function in his cardiac function. Lipitor dose increased from 20 mg to 80 mg daily. He feels fine. His biggest complaint is becoming easily agitated- this is getting better as he has started to accept it. He stopped smoking on 7/25. He stated his irritation was increasing prior to smoking cessation. He denies fevers, cough, SOB, jaw/arm pain or CP.    Social History   Social History  . Marital status: Married   Occupational History  . Executive Chef [employer]    Social History Main Topics  . Smoking status: Former Smoker    Packs/day: 0.25    Years: 18.00    Types: Cigarettes    Quit date: 11/15/2016  . Smokeless tobacco: Never Used     Comment: smokes about 1 cigarette daily  . Alcohol use Yes     Comment: rare   . Drug use: No   Social History Narrative   HSG   Culinary school in [place]   Married - '84 - 5 years divorced; remarried '96   1 son - '85    Past Medical History:  Diagnosis Date  . BPH (benign prostatic hyperplasia)   . CAD (coronary artery disease)    a. NSTEMI troponin >65 with cath 11/2016 S/p PTCA & DES to mid circumflex; irregularies in LAD & RCA, LVEDP 24, EF 50% and 45-50% by echo.  . Chronic diastolic CHF (congestive heart failure) ([place])   . Cluster headache    hx of  . Complication of anesthesia    difficult waking up"  . Diverticulosis   . Drug abuse    hx of  . Drug-seeking behavior   . GERD (gastroesophageal reflux disease)   . Hiatal hernia   . Hyperlipidemia   . Hypertension   . Hypothyroidism   .  Nontoxic multinodular goiter   . NSTEMI (non-ST elevated myocardial infarction) ([place])   . Pulmonary nodule --- CT 10/19/2014: Nodule is stable, no further routine x-rays 01/06/2009  . TIA (transient ischemic attack)   . Trigeminal neuralgia   . Tubular adenoma of colon 12/2015  . Unspecified asthma(493.90)    Family History  Problem Relation Age of Onset  . Diabetes Mother   . Hyperlipidemia Mother   . Hypertension Mother   . Hypertension Father   . Heart disease Father        CAD/CHF  . Cancer Father        Mesothelioma  . Heart disease Maternal Aunt   . Prostate cancer Neg Hx   . Colon cancer Neg Hx    Allergies as of 12/06/2016   No Known Allergies     Medication List       Accurate as of 12/06/16 11:59 PM. Always use your most recent med list.          aspirin EC 81 MG tablet Take 81 mg by mouth daily.   atorvastatin 80 MG tablet Commonly known as:  LIPITOR Take 1 tablet (80 mg total) by mouth at bedtime.   chlorhexidine 0.12 %  solution Commonly known as:  PERIDEX Use as directed 10 mLs in the mouth or throat 2 (two) times daily. As directed.   clopidogrel 75 MG tablet Commonly known as:  PLAVIX Take 1 tablet (75 mg total) by mouth daily with breakfast.   dicyclomine 20 MG tablet Commonly known as:  BENTYL Take 1 tablet (20 mg total) by mouth 3 (three) times daily before meals.   IMODIUM A-D 2 MG tablet Generic drug:  loperamide Take 4 mg by mouth 4 (four) times daily as needed for diarrhea or loose stools.   isosorbide mononitrate 30 MG 24 hr tablet Commonly known as:  IMDUR Take 1 tablet (30 mg total) by mouth daily.   losartan 100 MG tablet Commonly known as:  COZAAR Take 1 tablet (100 mg total) by mouth daily.   metoprolol tartrate 25 MG tablet Commonly known as:  LOPRESSOR Take 1 tablet (25 mg total) by mouth 2 (two) times daily.   nitroGLYCERIN 0.4 MG SL tablet Commonly known as:  NITROSTAT Place 1 tablet (0.4 mg total) under the tongue  every 5 (five) minutes as needed for chest pain.   ondansetron 8 MG tablet Commonly known as:  ZOFRAN Take by mouth 2 (two) times daily.   pantoprazole 40 MG tablet Commonly known as:  PROTONIX Take 1 tablet (40 mg total) by mouth daily.   promethazine 12.5 MG tablet Commonly known as:  PHENERGAN Take 1/2 -1 tablet by mouth three times a day as needed   psyllium 95 % Pack Commonly known as:  HYDROCIL/METAMUCIL Take 1 packet by mouth daily.   ranitidine 150 MG tablet Commonly known as:  ZANTAC Take 1 tablet (150 mg total) by mouth at bedtime.   traZODone 50 MG tablet Commonly known as:  DESYREL Take 0.5-1 tablets (25-50 mg total) by mouth at bedtime as needed for sleep.   ZYRTEC-D PO Take 1 tablet by mouth daily.            Discharge Care Instructions        Start     Ordered   12/06/16 0000  losartan (COZAAR) 100 MG tablet  Daily     12/06/16 0755   12/06/16 0000  nitroGLYCERIN (NITROSTAT) 0.4 MG SL tablet  Every 5 min PRN     12/06/16 0755   12/06/16 0000  Comprehensive metabolic panel     38/18/29 0803      ROS:  Constitutional: No fevers HEENT: No headaches, hearing loss, or runny nose, no sore throat Heart: No chest pain Lungs: No SOB, no cough Abd: No bowel changes, no pain, no N/V GU: No urinary complaints Neuro: No numbness, tingling or weakness Msk: No joint or muscle pain  Objective BP 110/80 (BP Location: Left Arm, Patient Position: Sitting, Cuff Size: Large)   Pulse 72   Temp 97.9 F (36.6 C) (Oral)   Ht 5\' 9"  (1.753 m)   Wt 230 lb 3.2 oz (104.4 kg)   SpO2 98%   BMI 33.99 kg/m  General Appearance:  awake, alert, oriented, in no acute distress and well developed, well nourished Skin:  there are no suspicious lesions or rashes of concern Head/face:  NCAT Eyes:  EOMI, PERRLA Ears:  canals and TMs NI Nose/Sinuses:  negative Mouth/Throat:  Mucosa moist, no lesions; pharynx without erythema, edema or exudate. Neck:  neck- supple, no  mass, non-tender and no jvd Lungs: Clear to auscultation.  No rales, rhonchi, or wheezing. Normal effort, no accessory muscle use. Heart:  Heart sounds  are normal.  Regular rate and rhythm. No bruits. Abdomen:  BS+, soft, NT, ND, no masses or organomegaly Musculoskeletal:  No muscle group atrophy or asymmetry, gait normal Neurologic:  Alert and oriented x 3, gait normal Psych exam: Nml mood and affect, age appropriate judgment and insight  NSTEMI (non-ST elevated myocardial infarction) [facility])  Hospital discharge follow-up - Plan: losartan (COZAAR) 100 MG tablet, nitroGLYCERIN (NITROSTAT) 0.4 MG SL tablet, Comprehensive metabolic panel  Discharge summary and medication list have been reviewed/reconciled.  Labs pending at the time of discharge have been reviewed or are still pending at the time of this visit.  Follow-up labs and appointments have been ordered and/or coordinated appropriately. Educational materials regarding the patient's admitting diagnosis provided.  TRANSITIONAL CARE MANAGEMENT CERTIFICATION:  I certify the following are true:   1. Communication with the patient/care giver was made within 2 business days of discharge.  2. Complexity of Medical decision making is mod.  3. Face to face visit occurred within 14 days of discharge.   Follow up on calcium. Refill Losartan. Refill nitro on behalf of cardiology team. Letter given for work as return to work note from cardiology was for today, but his appt is tomorrow. F/u as originally scheduled. The patient voiced understanding and agreement to the plan.  [place], DO 12/14/16 11:52 AM

## 2016-12-06 NOTE — Patient Instructions (Signed)
Give [place] 2-3 business days to get the results of your labs back.   Take 2 tabs of your losartan 50 mg tabs until you run out. A new dose has been called in.  If your shoulder is doing better, OK to cancel your appt.  Let us know if you need anything.

## 2016-12-06 NOTE — Progress Notes (Signed)
Cardiology Office Note    Date:  12/07/2016  ID:  [name], DOB 1960-08-25, MRN [id number] PCP:  [name], DO  Cardiologist:  Dr. Meda Coffee? Previously seen in consult by her 2016, then seen in office by Dr. Lovena Le 2016. Most recently seen by Dr. Martinique in 2018.   Chief Complaint: f/u NSTEMI  History of Present Illness:  [name] is a 56 y.o. male with history of CAD (NSTEMI s/p DES to Cx 11/2016), recently diagnosed chronic diastolic CHF, HTN, HLD, tobacco abuse, GERD, chronic nausea, BPH, cluster headache, h/o drug abuse, pain-med-seeking behavior, thyroid goiter, hypothyroidism, TIA, trigeminal neuralgia, asthma who presents for f/u NSTEMI.  He was remotely cathed in 2010 with nonobstructive disease. He's been struggling with chronic nausea lately with pending endoscopy before recent admission. He was admitted 11/2016 with chest pain, dyspnea, nausea and troponin >65. Cath showed total occlusion of mid Cx s/p PTCA/DES. There were irregularities are noted in the LAD and right coronary but no significant obstructive lesions seen. He did have frequent atypical pain post-cath which was managed medically, felt possibly musculoskeletal. There was suspicion of pain medication seeking behavior. His cath also showed acute diastolic CHF with EF 42% and LVEDP 24, 2D echo 11/28/16 showed moderate LVH, EF 45-50%, hypokinesis of the basal-midlateral, inferolateral, and inferior myocardium, lipomatous hypertrophy, mildly dilated RV. Most recent labs showed K 4.0, Cr 1.08, normal CBC, normal A1c, LDL 199. On day of dc he was reported to have 3/10 discomfort but no pain with ambulation. He was discharged on ASA, Plavix, Imdur, metoprolol, and atorvastatin. He had TOC visit yesterday with PCP which appears unremarkable.  He comes in today report that ever since discharge to have chest pain 2-3x a day which he feels is somewhat similar to recent admitting angina - states it feels like he is  being kicked. It is somewhat worse with position changes like leaning forward, sleeping on his stomach or lifting his arm up. It has woken him out of sleep. He has a difficult time describing it. No SOB, palpitations, LEE, syncope. He thinks he's gained a few lb since discharge - states he's been eating healthier but as a result has been eating more overall. Denies any orthopnea or PND. He is experiencing it in the office 4/10, reported as a discomfort. He has not taken SL NTG because it actually seemed to make the pain worse last admission.   Past Medical History:  Diagnosis Date  . BPH (benign prostatic hyperplasia)   . CAD (coronary artery disease)    a. NSTEMI troponin >65 with cath 11/2016 S/p PTCA & DES to mid circumflex; irregularies in LAD & RCA, LVEDP 24, EF 50% and 45-50% by echo.  . Chronic diastolic CHF (congestive heart failure) ([place])   . Cluster headache    hx of  . Complication of anesthesia    difficult waking up"  . Diverticulosis   . Drug abuse    hx of  . Drug-seeking behavior   . GERD (gastroesophageal reflux disease)   . Hiatal hernia   . Hyperlipidemia   . Hypertension   . Hypothyroidism   . Nontoxic multinodular goiter   . NSTEMI (non-ST elevated myocardial infarction) ([place])   . Pulmonary nodule --- CT 10/19/2014: Nodule is stable, no further routine x-rays 01/06/2009  . TIA (transient ischemic attack)   . Trigeminal neuralgia   . Tubular adenoma of colon 12/2015  . Unspecified asthma(493.90)     Past Surgical History:  Procedure Laterality  Date  . APPENDECTOMY    . bowel obstruction    . CARDIAC CATHETERIZATION N/A 11/25/2014   Procedure: Left Heart Cath and Coronary Angiography;  Surgeon: Belva Crome, MD;  Location: [place] [facility];  Service: Cardiovascular;  Laterality: N/A;  . CORONARY STENT INTERVENTION N/A 11/27/2016   Procedure: CORONARY STENT INTERVENTION;  Surgeon: Belva Crome, MD;  Location: [place] [facility];  Service: Cardiovascular;   Laterality: N/A;  . CORONARY STENT PLACEMENT  11/27/2016   STENT XIENCE ALPINE RX 3.0X15 drug eluting stent was successfully placed  . CYST EXCISION Left 10/31/2016   Jaw  . DESTRUCTION TRIGEMINAL NERVE VIA NEUROLYTIC AGENT  x 3 , R side last ~2010  . KNEE ARTHROSCOPY Right   . LEFT HEART CATH AND CORONARY ANGIOGRAPHY N/A 11/27/2016   Procedure: LEFT HEART CATH AND CORONARY ANGIOGRAPHY;  Surgeon: Belva Crome, MD;  Location: [place];  Service: Cardiovascular;  Laterality: N/A;  . 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 Right Shoulder Redo-Reconstruction  06/21/10    Current Medications: Current Meds  Medication Sig  . aspirin EC 81 MG tablet Take 81 mg by mouth daily.  Marland Kitchen atorvastatin (LIPITOR) 80 MG tablet Take 1 tablet (80 mg total) by mouth at bedtime.  . Cetirizine-Pseudoephedrine (ZYRTEC-D PO) Take 1 tablet by mouth daily.  . chlorhexidine (PERIDEX) 0.12 % solution Use as directed 10 mLs in the mouth or throat 2 (two) times daily. As directed.  . clopidogrel (PLAVIX) 75 MG tablet Take 1 tablet (75 mg total) by mouth daily with breakfast.  . dicyclomine (BENTYL) 20 MG tablet Take 1 tablet (20 mg total) by mouth 3 (three) times daily before meals.  . isosorbide mononitrate (IMDUR) 30 MG 24 hr tablet Take 1 tablet (30 mg total) by mouth daily.  Marland Kitchen loperamide (IMODIUM A-D) 2 MG tablet Take 4 mg by mouth 4 (four) times daily as needed for diarrhea or loose stools.  Marland Kitchen losartan (COZAAR) 100 MG tablet Take 1 tablet (100 mg total) by mouth daily.  . metoprolol tartrate (LOPRESSOR) 25 MG tablet Take 1 tablet (25 mg total) by mouth 2 (two) times daily.  . nitroGLYCERIN (NITROSTAT) 0.4 MG SL tablet Place 1 tablet (0.4 mg total) under the tongue every 5 (five) minutes as needed for chest pain.  Marland Kitchen ondansetron (ZOFRAN) 8 MG tablet Take by mouth 2 (two) times daily.  . pantoprazole (PROTONIX) 40 MG tablet Take 1 tablet (40 mg total) by mouth daily.  .  promethazine (PHENERGAN) 12.5 MG tablet Take 12.5 mg by mouth every 6 (six) hours as needed for nausea or vomiting.  . psyllium (HYDROCIL/METAMUCIL) 95 % PACK Take 1 packet by mouth daily.  . ranitidine (ZANTAC) 150 MG tablet Take 1 tablet (150 mg total) by mouth at bedtime.  . traZODone (DESYREL) 50 MG tablet Take 0.5-1 tablets (25-50 mg total) by mouth at bedtime as needed for sleep.  . [DISCONTINUED] promethazine (PHENERGAN) 12.5 MG tablet Take 1/2 -1 tablet by mouth three times a day as needed (Patient taking differently: Take 1/2 -1 tablet by mouth three times a day as needed for night time nausea)     Allergies:   Patient has no known allergies.   Social History   Social History  . Marital status: Married    Spouse name: N/A  . Number of children: 2  . Years of education: N/A   Occupational History  . Merchandiser, retail [employer]  Social History Main Topics  . Smoking status: Former Smoker    Packs/day: 0.25    Years: 18.00    Types: Cigarettes    Quit date: 11/15/2016  . Smokeless tobacco: Never Used     Comment: smokes about 1 cigarette daily  . Alcohol use Yes     Comment: rare   . Drug use: No  . Sexual activity: Not Asked   Other Topics Concern  . None   Social History Narrative   HSG   Culinary school in [place]   Married - '84 - 5 years divorced; remarried '96   1 son - '85                  Family History:  Family History  Problem Relation Age of Onset  . Diabetes Mother   . Hyperlipidemia Mother   . Hypertension Mother   . Hypertension Father   . Heart disease Father        CAD/CHF  . Cancer Father        Mesothelioma  . Heart disease Maternal Aunt   . Prostate cancer Neg Hx   . Colon cancer Neg Hx     ROS:   Please see the history of present illness. Negative for fevers or chills. All other systems are reviewed and otherwise negative.    PHYSICAL EXAM:   VS:  BP 140/70   Pulse 65   Ht 5\' 10"  (0.998 m)   Wt 235 lb (106.6 kg)    BMI 33.72 kg/m   BMI: Body mass index is 33.72 kg/m. GEN: Well nourished, well developed AAM, obese, in no acute distress  HEENT: normocephalic, atraumatic Neck: no JVD, carotid bruits, or masses Cardiac: RRR; no murmurs, rubs, or gallops, no edema  Respiratory:  clear to auscultation bilaterally, normal work of breathing GI: soft, nontender, nondistended, + BS MS: no deformity or atrophy  Skin: warm and dry, no rash, right radial cath site without hematoma or ecchymosis; good pulse. Neuro:  Alert and Oriented x 3, Strength and sensation are intact, follows commands Psych: euthymic mood, full affect  Wt Readings from Last 3 Encounters:  12/07/16 235 lb (106.6 kg)  12/06/16 230 lb 3.2 oz (104.4 kg)  11/30/16 231 lb 7.7 oz (105 kg)      Studies/Labs Reviewed:   EKG:  EKG was ordered today and personally reviewed by me and demonstrates NSR 66bpm, nonspecific TW changes in III, avF, similar to prior  Recent Labs: 10/09/2016: Magnesium 2.1 11/24/2016: TSH 0.96 11/28/2016: Hemoglobin 14.6; Platelets 153 12/06/2016: ALT 22; BUN 20; Creatinine, Ser 1.08; Potassium 4.0; Sodium 138   Lipid Panel    Component Value Date/Time   CHOL 298 (H) 11/27/2016 0123   TRIG 98 11/27/2016 0123   HDL 79 11/27/2016 0123   CHOLHDL 3.8 11/27/2016 0123   VLDL 20 11/27/2016 0123   LDLCALC 199 (H) 11/27/2016 0123   LDLDIRECT 209.8 07/20/2009 0951    Additional studies/ records that were reviewed today include: Summarized above  Cath 11/27/16 Conclusion   Non-ST elevation myocardial infarction presentation with ongoing chest pain and rising troponin greater than 9.  Total occlusion of the mid circumflex with prolonged pain presentation due to the presence of collaterals.   Successful PCI of the mid circumflex total occlusion reducing the obstruction to less than 20% using a 3.0 Xience Alpine drug-eluting stent to restorie TIMI grade 3 flow. Final postdilatation balloon diameter was 3.5 [place]  balloon.  Irregularities are noted in the  LAD and right coronary but no significant obstructive lesions are seen.  Acute diastolic heart failure with LVEF 50%, and EDP 24 mmHg. RECOMMENDATIONS:  Aspirin and Plavix for at least 6 months and preferably 12.  Aggressive risk factor modification.    ASSESSMENT & PLAN:   1. CAD - he has had recurrent chest pain ever since NSTEMI. Post-cath troponins went from 65 to 6 to 7 then 3. He reports similar discomfort to recent angina. Prior EKG was unhelpful likely due to Cx lesion (troponin >65 initially). Etiology of persistent discomfort not totally clear. There is an atypical component as well. I discussed with Dr. Johnsie Cancel who feels the patient should be re-cathed to definitively assess whether recurrent obstruction is the cause. Risks and benefits of cardiac catheterization have been discussed with the patient.  These include bleeding, infection, kidney damage, stroke, heart attack, death.  The patient understands these risks and is willing to proceed. There are currently no beds available at [facility] so will send to ED but plan to add to cath board today. Spoke with cath lab. Will hold off on NTG as patient previously said this made him feel worse. If cath unrevealing, may need to consider empiric anti-inflammatory in case this represents Dressler's. Check CBC, PT/INR upon arrival to ED. 2. Chronic diastolic CHF - appears euvolemic on exam but recent LVEDP was up. His symptoms are atypical for volume overload but will check BNP; LVEDP will be reassessed by cath. 3. Essential HTN - BP recently 110/70 at PCP office. Follow.  4. Hyperlipidemia - upon future follow-up would arrange f/u lipids/LFTs once acute issues are resolved.  Disposition: F/u to be determined following outcome of above.   Medication Adjustments/Labs and Tests Ordered: Current medicines are reviewed at length with the patient today.  Concerns regarding medicines are outlined above.  Medication changes, Labs and Tests ordered today are summarized above and listed in the Patient Instructions accessible in Encounters.   Signed, [name], PA-C  12/07/2016 10:57 AM    [facility] [address], [place], [place]  [address] Phone: [phone]; Fax: [phone]

## 2016-12-07 ENCOUNTER — Encounter: Payer: Self-pay | Admitting: *Deleted

## 2016-12-07 ENCOUNTER — Ambulatory Visit (INDEPENDENT_AMBULATORY_CARE_PROVIDER_SITE_OTHER): Payer: Self-pay | Admitting: Physician Assistant

## 2016-12-07 ENCOUNTER — Ambulatory Visit (HOSPITAL_COMMUNITY)
Admission: EM | Disposition: A | Discharge status: Home / Self Care | Payer: Self-pay | Source: Home / Self Care | Attending: Emergency Medicine

## 2016-12-07 ENCOUNTER — Observation Stay (HOSPITAL_COMMUNITY)
Admission: EM | Admit: 2016-12-07 | Discharge: 2016-12-08 | Disposition: A | Discharge status: Home / Self Care | Payer: BLUE CROSS/BLUE SHIELD | Attending: Emergency Medicine | Admitting: Emergency Medicine

## 2016-12-07 ENCOUNTER — Encounter: Payer: Self-pay | Admitting: Physician Assistant

## 2016-12-07 ENCOUNTER — Encounter (HOSPITAL_COMMUNITY): Payer: Self-pay | Admitting: Emergency Medicine

## 2016-12-07 ENCOUNTER — Observation Stay (HOSPITAL_COMMUNITY): Payer: BLUE CROSS/BLUE SHIELD

## 2016-12-07 VITALS — BP 140/70 | HR 65 | Ht 70.0 in | Wt 235.0 lb

## 2016-12-07 DIAGNOSIS — I2582 Chronic total occlusion of coronary artery: Secondary | ICD-10-CM | POA: Diagnosis not present

## 2016-12-07 DIAGNOSIS — K219 Gastro-esophageal reflux disease without esophagitis: Secondary | ICD-10-CM | POA: Diagnosis present

## 2016-12-07 DIAGNOSIS — I25119 Atherosclerotic heart disease of native coronary artery with unspecified angina pectoris: Secondary | ICD-10-CM | POA: Diagnosis not present

## 2016-12-07 DIAGNOSIS — I252 Old myocardial infarction: Secondary | ICD-10-CM | POA: Diagnosis not present

## 2016-12-07 DIAGNOSIS — I5032 Chronic diastolic (congestive) heart failure: Secondary | ICD-10-CM | POA: Diagnosis not present

## 2016-12-07 DIAGNOSIS — Z79891 Long term (current) use of opiate analgesic: Secondary | ICD-10-CM | POA: Diagnosis not present

## 2016-12-07 DIAGNOSIS — E669 Obesity, unspecified: Secondary | ICD-10-CM

## 2016-12-07 DIAGNOSIS — E785 Hyperlipidemia, unspecified: Secondary | ICD-10-CM

## 2016-12-07 DIAGNOSIS — R0789 Other chest pain: Principal | ICD-10-CM | POA: Diagnosis present

## 2016-12-07 DIAGNOSIS — N400 Enlarged prostate without lower urinary tract symptoms: Secondary | ICD-10-CM | POA: Insufficient documentation

## 2016-12-07 DIAGNOSIS — Z955 Presence of coronary angioplasty implant and graft: Secondary | ICD-10-CM | POA: Insufficient documentation

## 2016-12-07 DIAGNOSIS — Z79899 Other long term (current) drug therapy: Secondary | ICD-10-CM | POA: Insufficient documentation

## 2016-12-07 DIAGNOSIS — R079 Chest pain, unspecified: Secondary | ICD-10-CM | POA: Diagnosis not present

## 2016-12-07 DIAGNOSIS — I110 Hypertensive heart disease with heart failure: Secondary | ICD-10-CM | POA: Diagnosis not present

## 2016-12-07 DIAGNOSIS — Z8673 Personal history of transient ischemic attack (TIA), and cerebral infarction without residual deficits: Secondary | ICD-10-CM | POA: Diagnosis not present

## 2016-12-07 DIAGNOSIS — F1911 Other psychoactive substance abuse, in remission: Secondary | ICD-10-CM | POA: Diagnosis not present

## 2016-12-07 DIAGNOSIS — I10 Essential (primary) hypertension: Secondary | ICD-10-CM | POA: Diagnosis present

## 2016-12-07 DIAGNOSIS — E039 Hypothyroidism, unspecified: Secondary | ICD-10-CM | POA: Diagnosis not present

## 2016-12-07 DIAGNOSIS — Z87891 Personal history of nicotine dependence: Secondary | ICD-10-CM | POA: Diagnosis not present

## 2016-12-07 DIAGNOSIS — I2510 Atherosclerotic heart disease of native coronary artery without angina pectoris: Secondary | ICD-10-CM | POA: Diagnosis not present

## 2016-12-07 DIAGNOSIS — Z7982 Long term (current) use of aspirin: Secondary | ICD-10-CM | POA: Insufficient documentation

## 2016-12-07 DIAGNOSIS — E7800 Pure hypercholesterolemia, unspecified: Secondary | ICD-10-CM | POA: Diagnosis present

## 2016-12-07 HISTORY — PX: LEFT HEART CATH AND CORONARY ANGIOGRAPHY: CATH118249

## 2016-12-07 LAB — CBC
HCT: 43.9 % (ref 39.0–52.0)
Hemoglobin: 15.2 g/dL (ref 13.0–17.0)
MCH: 31.7 pg (ref 26.0–34.0)
MCHC: 34.6 g/dL (ref 30.0–36.0)
MCV: 91.6 fL (ref 78.0–100.0)
Platelets: 178 10*3/uL (ref 150–400)
RBC: 4.79 MIL/uL (ref 4.22–5.81)
RDW: 13.1 % (ref 11.5–15.5)
WBC: 8.6 10*3/uL (ref 4.0–10.5)

## 2016-12-07 LAB — BASIC METABOLIC PANEL
Anion gap: 9 (ref 5–15)
BUN: 19 mg/dL (ref 6–20)
CO2: 24 mmol/L (ref 22–32)
Calcium: 9.1 mg/dL (ref 8.9–10.3)
Chloride: 106 mmol/L (ref 101–111)
Creatinine, Ser: 1.2 mg/dL (ref 0.61–1.24)
GFR calc Af Amer: >60 mL/min (ref >60)
GFR calc non Af Amer: >60 mL/min (ref >60)
Glucose, Bld: 99 mg/dL (ref 65–99)
Potassium: 4.4 mmol/L (ref 3.5–5.1)
Sodium: 139 mmol/L (ref 135–145)

## 2016-12-07 LAB — PROTIME-INR
INR: 1.04
Prothrombin Time: 13.6 seconds (ref 11.4–15.2)

## 2016-12-07 LAB — BRAIN NATRIURETIC PEPTIDE: B Natriuretic Peptide: 64.6 pg/mL (ref 0.0–100.0)

## 2016-12-07 LAB — TROPONIN I: Troponin I: 0.07 ng/mL (ref <0.03)

## 2016-12-07 SURGERY — LEFT HEART CATH AND CORONARY ANGIOGRAPHY
Anesthesia: LOCAL

## 2016-12-07 MED ORDER — LOSARTAN POTASSIUM 50 MG PO TABS
100.0000 mg | ORAL_TABLET | Freq: Every day | ORAL | Status: DC
  Administered 2016-12-08: 100 mg via ORAL
  Filled 2016-12-07: qty 2

## 2016-12-07 MED ORDER — PROMETHAZINE HCL 25 MG PO TABS: 12.5000 mg | ORAL_TABLET | Freq: Four times a day (QID) | ORAL | Status: DC | PRN

## 2016-12-07 MED ORDER — ASPIRIN EC 81 MG PO TBEC
81.0000 mg | DELAYED_RELEASE_TABLET | Freq: Every day | ORAL | Status: DC
  Administered 2016-12-08: 81 mg via ORAL
  Filled 2016-12-07: qty 1

## 2016-12-07 MED ORDER — ACETAMINOPHEN 325 MG PO TABS: 650.0000 mg | ORAL_TABLET | ORAL | Status: DC | PRN

## 2016-12-07 MED ORDER — OXYCODONE HCL 5 MG PO TABS
5.0000 mg | ORAL_TABLET | ORAL | Status: DC | PRN
  Administered 2016-12-07 (×2): 5 mg via ORAL
  Filled 2016-12-07 (×2): qty 1

## 2016-12-07 MED ORDER — NITROGLYCERIN 0.4 MG SL SUBL: 0.4000 mg | SUBLINGUAL_TABLET | SUBLINGUAL | Status: DC | PRN

## 2016-12-07 MED ORDER — ATORVASTATIN CALCIUM 80 MG PO TABS
80.0000 mg | ORAL_TABLET | Freq: Every day | ORAL | Status: DC
  Administered 2016-12-07: 22:00:00 80 mg via ORAL
  Filled 2016-12-07: qty 1

## 2016-12-07 MED ORDER — VERAPAMIL HCL 2.5 MG/ML IV SOLN
INTRAVENOUS | Status: DC | PRN
  Administered 2016-12-07: 16:00:00 via INTRA_ARTERIAL

## 2016-12-07 MED ORDER — FENTANYL CITRATE (PF) 100 MCG/2ML IJ SOLN
INTRAMUSCULAR | Status: AC
  Filled 2016-12-07: qty 2

## 2016-12-07 MED ORDER — SODIUM CHLORIDE 0.9 % IV SOLN: 250.0000 mL | INTRAVENOUS | Status: DC | PRN

## 2016-12-07 MED ORDER — FAMOTIDINE 20 MG PO TABS
20.0000 mg | ORAL_TABLET | Freq: Every day | ORAL | Status: DC
  Administered 2016-12-07: 20 mg via ORAL
  Filled 2016-12-07: qty 1

## 2016-12-07 MED ORDER — SODIUM CHLORIDE 0.9 % IV SOLN
INTRAVENOUS | Status: DC
  Administered 2016-12-07: 50 mL/h via INTRAVENOUS

## 2016-12-07 MED ORDER — ONDANSETRON HCL 4 MG PO TABS: 8.0000 mg | ORAL_TABLET | Freq: Two times a day (BID) | ORAL | Status: DC | PRN

## 2016-12-07 MED ORDER — MORPHINE SULFATE (PF) 10 MG/ML IV SOLN
INTRAVENOUS | Status: AC
  Filled 2016-12-07: qty 1

## 2016-12-07 MED ORDER — MIDAZOLAM HCL 2 MG/2ML IJ SOLN
INTRAMUSCULAR | Status: DC | PRN
  Administered 2016-12-07 (×2): 1 mg via INTRAVENOUS

## 2016-12-07 MED ORDER — IOPAMIDOL (ISOVUE-370) INJECTION 76%
INTRAVENOUS | Status: AC
  Filled 2016-12-07: qty 100

## 2016-12-07 MED ORDER — ISOSORBIDE MONONITRATE ER 30 MG PO TB24: 30.0000 mg | ORAL_TABLET | Freq: Every day | ORAL | Status: DC

## 2016-12-07 MED ORDER — ASPIRIN 81 MG PO CHEW: 324.0000 mg | CHEWABLE_TABLET | ORAL | Status: DC

## 2016-12-07 MED ORDER — DICYCLOMINE HCL 20 MG PO TABS
20.0000 mg | ORAL_TABLET | Freq: Three times a day (TID) | ORAL | Status: DC
  Administered 2016-12-08: 20 mg via ORAL
  Filled 2016-12-07 (×4): qty 1

## 2016-12-07 MED ORDER — SODIUM CHLORIDE 0.9% FLUSH: 3.0000 mL | INTRAVENOUS | Status: DC | PRN

## 2016-12-07 MED ORDER — METOPROLOL TARTRATE 25 MG PO TABS
25.0000 mg | ORAL_TABLET | Freq: Two times a day (BID) | ORAL | Status: DC
  Administered 2016-12-07 – 2016-12-08 (×2): 25 mg via ORAL
  Filled 2016-12-07 (×2): qty 1

## 2016-12-07 MED ORDER — PANTOPRAZOLE SODIUM 40 MG PO TBEC
40.0000 mg | DELAYED_RELEASE_TABLET | Freq: Every day | ORAL | Status: DC
  Filled 2016-12-07: qty 1

## 2016-12-07 MED ORDER — CLOPIDOGREL BISULFATE 75 MG PO TABS
75.0000 mg | ORAL_TABLET | Freq: Every day | ORAL | Status: DC
Start: 2016-12-08 — End: 2016-12-08
  Administered 2016-12-08: 10:00:00 75 mg via ORAL
  Filled 2016-12-07: qty 1

## 2016-12-07 MED ORDER — FENTANYL CITRATE (PF) 100 MCG/2ML IJ SOLN
INTRAMUSCULAR | Status: DC | PRN
  Administered 2016-12-07: 25 ug via INTRAVENOUS
  Administered 2016-12-07: 50 ug via INTRAVENOUS

## 2016-12-07 MED ORDER — ONDANSETRON HCL 4 MG/2ML IJ SOLN: 4.0000 mg | Freq: Four times a day (QID) | INTRAMUSCULAR | Status: DC | PRN

## 2016-12-07 MED ORDER — SODIUM CHLORIDE 0.9% FLUSH: 3.0000 mL | Freq: Two times a day (BID) | INTRAVENOUS | Status: DC

## 2016-12-07 MED ORDER — ASPIRIN 300 MG RE SUPP
300.0000 mg | RECTAL | Status: DC
Start: 2016-12-07 — End: 2016-12-07

## 2016-12-07 MED ORDER — LIDOCAINE HCL (PF) 1 % IJ SOLN
INTRAMUSCULAR | Status: DC | PRN
  Administered 2016-12-07: 2 mL

## 2016-12-07 MED ORDER — ENOXAPARIN SODIUM 40 MG/0.4ML ~~LOC~~ SOLN
40.0000 mg | SUBCUTANEOUS | Status: DC
  Administered 2016-12-08: 10:00:00 40 mg via SUBCUTANEOUS
  Filled 2016-12-07: qty 0.4

## 2016-12-07 MED ORDER — PSYLLIUM 95 % PO PACK: 1.0000 | PACK | Freq: Every day | ORAL | Status: DC

## 2016-12-07 MED ORDER — MORPHINE SULFATE (PF) 10 MG/ML IV SOLN
INTRAVENOUS | Status: DC | PRN
  Administered 2016-12-07: 2 mg via INTRAVENOUS

## 2016-12-07 MED ORDER — HEPARIN SODIUM (PORCINE) 1000 UNIT/ML IJ SOLN
INTRAMUSCULAR | Status: AC
  Filled 2016-12-07: qty 1

## 2016-12-07 MED ORDER — HEPARIN SODIUM (PORCINE) 1000 UNIT/ML IJ SOLN
INTRAMUSCULAR | Status: DC | PRN
  Administered 2016-12-07: 5000 [IU] via INTRAVENOUS

## 2016-12-07 MED ORDER — FUROSEMIDE 40 MG PO TABS
40.0000 mg | ORAL_TABLET | Freq: Every day | ORAL | Status: DC
  Administered 2016-12-07: 40 mg via ORAL
  Filled 2016-12-07: qty 1

## 2016-12-07 MED ORDER — ISOSORBIDE MONONITRATE ER 30 MG PO TB24
30.0000 mg | ORAL_TABLET | Freq: Every day | ORAL | Status: DC
  Administered 2016-12-07 – 2016-12-08 (×2): 30 mg via ORAL
  Filled 2016-12-07 (×2): qty 1

## 2016-12-07 MED ORDER — VERAPAMIL HCL 2.5 MG/ML IV SOLN
INTRAVENOUS | Status: AC
  Filled 2016-12-07: qty 2

## 2016-12-07 MED ORDER — TRAZODONE HCL 50 MG PO TABS
25.0000 mg | ORAL_TABLET | Freq: Every evening | ORAL | Status: DC | PRN
  Administered 2016-12-07: 22:00:00 50 mg via ORAL
  Filled 2016-12-07: qty 1

## 2016-12-07 MED ORDER — LIDOCAINE HCL (PF) 1 % IJ SOLN
INTRAMUSCULAR | Status: AC
Start: 2016-12-07 — End: 2016-12-07
  Filled 2016-12-07: qty 30

## 2016-12-07 MED ORDER — MIDAZOLAM HCL 2 MG/2ML IJ SOLN
INTRAMUSCULAR | Status: AC
  Filled 2016-12-07: qty 2

## 2016-12-07 MED ORDER — VERAPAMIL HCL 2.5 MG/ML IV SOLN
INTRAVENOUS | Status: AC
Start: 2016-12-07 — End: 2016-12-07
  Filled 2016-12-07: qty 2

## 2016-12-07 MED ORDER — HEPARIN (PORCINE) IN NACL 2-0.9 UNIT/ML-% IJ SOLN
INTRAMUSCULAR | Status: AC
  Filled 2016-12-07: qty 1000

## 2016-12-07 MED ORDER — SODIUM CHLORIDE 0.9% FLUSH
3.0000 mL | Freq: Two times a day (BID) | INTRAVENOUS | Status: DC
  Administered 2016-12-08: 3 mL via INTRAVENOUS

## 2016-12-07 SURGICAL SUPPLY — 10 items
CATH 5FR JL3.5 JR4 ANG PIG MP (CATHETERS) ×2 IMPLANT
DEVICE RAD COMP TR BAND LRG (VASCULAR PRODUCTS) ×2 IMPLANT
ELECT DEFIB PAD ADLT CADENCE (PAD) ×2 IMPLANT
GLIDESHEATH SLEND SS 6F .021 (SHEATH) ×2 IMPLANT
GUIDEWIRE INQWIRE 1.5J.035X260 (WIRE) ×1 IMPLANT
INQWIRE 1.5J .035X260CM (WIRE) ×2
KIT HEART LEFT (KITS) ×2 IMPLANT
PACK CARDIAC CATHETERIZATION (CUSTOM PROCEDURE TRAY) ×2 IMPLANT
TRANSDUCER W/STOPCOCK (MISCELLANEOUS) ×2 IMPLANT
TUBING CIL FLEX 10 FLL-RA (TUBING) ×2 IMPLANT

## 2016-12-07 NOTE — Interval H&P Note (Signed)
History and Physical Interval Note:  12/07/2016 3:38 PM  Abdelaziz Westenberger  has presented today for cardiac catheterization, with the diagnosis of chest pain and recent NSTEMI. The various methods of treatment have been discussed with the patient and family. After consideration of risks, benefits and other options for treatment, the patient has consented to  Procedure(s): LEFT HEART CATH AND CORONARY ANGIOGRAPHY (N/A) as a surgical intervention .  The patient's history has been reviewed, patient examined, no change in status, stable for surgery.  I have reviewed the patient's chart and labs.  Questions were answered to the patient's satisfaction.    Cath Lab Visit (complete for each Cath Lab visit)  Clinical Evaluation Leading to the Procedure:   ACS: Yes.    Non-ACS:    Anginal Classification: CCS IV  Anti-ischemic medical therapy: Maximal Therapy (2 or more classes of medications)  Non-Invasive Test Results: No non-invasive testing performed  Prior CABG: No previous CABG  Edan Serratore

## 2016-12-07 NOTE — ED Notes (Signed)
Spoke with cardiology PA who stated patient will have cardiac cath as an add on today.

## 2016-12-07 NOTE — H&P (Signed)
Cardiology Office Note    Date:  12/07/2016  ID:  [name], DOB 03/01/1961, MRN [id number] PCP:  [name], DO  Cardiologist:  Dr. Meda Coffee? Previously seen in consult by her 2016, then seen in office by Dr. Lovena Le 2016. Most recently seen by Dr. Martinique in 2018.   Chief Complaint: f/u NSTEMI  History of Present Illness:  [name] is a 56 y.o. male with history of CAD (NSTEMI s/p DES to Cx 11/2016), recently diagnosed chronic diastolic CHF, HTN, HLD, tobacco abuse, GERD, chronic nausea, BPH, cluster headache, h/o drug abuse, pain-med-seeking behavior, thyroid goiter, hypothyroidism, TIA, trigeminal neuralgia, asthma who presents for f/u NSTEMI.  He was remotely cathed in 2010 with nonobstructive disease. He's been struggling with chronic nausea lately with pending endoscopy before recent admission. He was admitted 11/2016 with chest pain, dyspnea, nausea and troponin >65. Cath showed total occlusion of mid Cx s/p PTCA/DES. There were irregularities are noted in the LAD and right coronary but no significant obstructive lesions seen. He did have frequent atypical pain post-cath which was managed medically, felt possibly musculoskeletal. There was suspicion of pain medication seeking behavior. His cath also showed acute diastolic CHF with EF 31% and LVEDP 24, 2D echo 11/28/16 showed moderate LVH, EF 45-50%, hypokinesis of the basal-midlateral, inferolateral, and inferior myocardium, lipomatous hypertrophy, mildly dilated RV. Most recent labs showed K 4.0, Cr 1.08, normal CBC, normal A1c, LDL 199. On day of dc he was reported to have 3/10 discomfort but no pain with ambulation. He was discharged on ASA, Plavix, Imdur, metoprolol, and atorvastatin. He had TOC visit yesterday with PCP which appears unremarkable.  He comes in today report that ever since discharge to have chest pain 2-3x a day which he feels is somewhat similar to recent admitting angina - states it feels like he is  being kicked. It is somewhat worse with position changes like leaning forward, sleeping on his stomach or lifting his arm up. It has woken him out of sleep. He has a difficult time describing it. No SOB, palpitations, LEE, syncope. He thinks he's gained a few lb since discharge - states he's been eating healthier but as a result has been eating more overall. Denies any orthopnea or PND. He is experiencing it in the office 4/10, reported as a discomfort. He has not taken SL NTG because it actually seemed to make the pain worse last admission.   Past Medical History:  Diagnosis Date  . BPH (benign prostatic hyperplasia)   . CAD (coronary artery disease)    a. NSTEMI troponin >65 with cath 11/2016 S/p PTCA & DES to mid circumflex; irregularies in LAD & RCA, LVEDP 24, EF 50% and 45-50% by echo.  . Chronic diastolic CHF (congestive heart failure) ([place])   . Cluster headache    hx of  . Complication of anesthesia    difficult waking up"  . Diverticulosis   . Drug abuse    hx of  . Drug-seeking behavior   . GERD (gastroesophageal reflux disease)   . Hiatal hernia   . Hyperlipidemia   . Hypertension   . Hypothyroidism   . Nontoxic multinodular goiter   . NSTEMI (non-ST elevated myocardial infarction) ([place])   . Pulmonary nodule --- CT 10/19/2014: Nodule is stable, no further routine x-rays 01/06/2009  . TIA (transient ischemic attack)   . Trigeminal neuralgia   . Tubular adenoma of colon 12/2015  . Unspecified asthma(493.90)     Past Surgical History:  Procedure Laterality  Date  . APPENDECTOMY    . bowel obstruction    . CARDIAC CATHETERIZATION N/A 11/25/2014   Procedure: Left Heart Cath and Coronary Angiography;  Surgeon: Belva Crome, MD;  Location: [facility];  Service: Cardiovascular;  Laterality: N/A;  . CORONARY STENT INTERVENTION N/A 11/27/2016   Procedure: CORONARY STENT INTERVENTION;  Surgeon: Belva Crome, MD;  Location: [facility];  Service: Cardiovascular;   Laterality: N/A;  . CORONARY STENT PLACEMENT  11/27/2016   STENT XIENCE ALPINE RX 3.0X15 drug eluting stent was successfully placed  . CYST EXCISION Left 10/31/2016   Jaw  . DESTRUCTION TRIGEMINAL NERVE VIA NEUROLYTIC AGENT  x 3 , R side last ~2010  . KNEE ARTHROSCOPY Right   . LEFT HEART CATH AND CORONARY ANGIOGRAPHY N/A 11/27/2016   Procedure: LEFT HEART CATH AND CORONARY ANGIOGRAPHY;  Surgeon: Belva Crome, MD;  Location: [facility];  Service: Cardiovascular;  Laterality: N/A;  . 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 Right Shoulder Redo-Reconstruction  06/21/10    Current Medications: No outpatient prescriptions have been marked as taking for the 12/07/16 encounter [facility] Encounter).     Allergies:   Patient has no known allergies.   Social History   Social History  . Marital status: Married    Spouse name: N/A  . Number of children: 2  . 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 Smoking status: Former Smoker    Packs/day: 0.25    Years: 18.00    Types: Cigarettes    Quit date: 11/15/2016  . Smokeless tobacco: Never Used     Comment: smokes about 1 cigarette daily  . Alcohol use Yes     Comment: rare   . Drug use: No  . Sexual activity: Not on file   Other Topics Concern  . Not on file   Social History Narrative   HSG   Culinary school in [place]   Married - '84 - 5 years divorced; remarried '96   1 son - '85                  Family History:  Family History  Problem Relation Age of Onset  . Diabetes Mother   . Hyperlipidemia Mother   . Hypertension Mother   . Hypertension Father   . Heart disease Father        CAD/CHF  . Cancer Father        Mesothelioma  . Heart disease Maternal Aunt   . Prostate cancer Neg Hx   . Colon cancer Neg Hx     ROS:   Please see the history of present illness. Negative for fevers or chills. All other  systems are reviewed and otherwise negative.    PHYSICAL EXAM:   VS:  There were no vitals taken for this visit.  BMI: There is no height or weight on file to calculate BMI. GEN: Well nourished, well developed AAM, obese, in no acute distress  HEENT: normocephalic, atraumatic Neck: no JVD, carotid bruits, or masses Cardiac: RRR; no murmurs, rubs, or gallops, no edema  Respiratory:  clear to auscultation bilaterally, normal work of breathing GI: soft, nontender, nondistended, + BS MS: no deformity or atrophy  Skin: warm and dry, no rash, right radial cath site without hematoma or ecchymosis; good pulse. Neuro:  Alert and Oriented x  3, Strength and sensation are intact, follows commands Psych: euthymic mood, full affect  Wt Readings from Last 3 Encounters:  12/07/16 235 lb (106.6 kg)  12/06/16 230 lb 3.2 oz (104.4 kg)  11/30/16 231 lb 7.7 oz (105 kg)      Studies/Labs Reviewed:   EKG:  EKG was ordered today and personally reviewed by me and demonstrates NSR 66bpm, nonspecific TW changes in III, avF, similar to prior  Recent Labs: 10/09/2016: Magnesium 2.1 11/24/2016: TSH 0.96 11/28/2016: Hemoglobin 14.6; Platelets 153 12/06/2016: ALT 22; BUN 20; Creatinine, Ser 1.08; Potassium 4.0; Sodium 138   Lipid Panel    Component Value Date/Time   CHOL 298 (H) 11/27/2016 0123   TRIG 98 11/27/2016 0123   HDL 79 11/27/2016 0123   CHOLHDL 3.8 11/27/2016 0123   VLDL 20 11/27/2016 0123   LDLCALC 199 (H) 11/27/2016 0123   LDLDIRECT 209.8 07/20/2009 0951    Additional studies/ records that were reviewed today include: Summarized above  Cath 11/27/16 Conclusion   Non-ST elevation myocardial infarction presentation with ongoing chest pain and rising troponin greater than 9.  Total occlusion of the mid circumflex with prolonged pain presentation due to the presence of collaterals.   Successful PCI of the mid circumflex total occlusion reducing the obstruction to less than 20% using a 3.0  Xience Alpine drug-eluting stent to restorie TIMI grade 3 flow. Final postdilatation balloon diameter was 3.5 [place] balloon.  Irregularities are noted in the LAD and right coronary but no significant obstructive lesions are seen.  Acute diastolic heart failure with LVEF 50%, and EDP 24 mmHg. RECOMMENDATIONS:  Aspirin and Plavix for at least 6 months and preferably 12.  Aggressive risk factor modification.    ASSESSMENT & PLAN:   1. CAD - he has had recurrent chest pain ever since NSTEMI. Post-cath troponins went from 65 to 6 to 7 then 3. He reports similar discomfort to recent angina. Prior EKG was unhelpful likely due to Cx lesion (troponin >65 initially). Etiology of persistent discomfort not totally clear. There is an atypical component as well. I discussed with Dr. Johnsie Cancel who feels the patient should be re-cathed to definitively assess whether recurrent obstruction is the cause. Risks and benefits of cardiac catheterization have been discussed with the patient.  These include bleeding, infection, kidney damage, stroke, heart attack, death. The patient understands these risks and is willing to proceed. There are currently no beds available at [facility] so will send to ED but plan to add to cath board today. Spoke with cath lab. Will hold off on NTG as patient previously said this made him feel worse. If cath unrevealing, may need to consider empiric anti-inflammatory in case this represents Dressler's. Check CBC, PT/INR upon arrival to ED. 2. Chronic diastolic CHF - appears euvolemic on exam but recent LVEDP was up. His symptoms are atypical for volume overload but will check BNP; LVEDP will be reassessed by cath. 3. Essential HTN - BP recently 110/70 at PCP office. Follow.  4. Hyperlipidemia - upon future follow-up would arrange f/u lipids/LFTs once acute issues are resolved.  Disposition: F/u to be determined following outcome of above.   Medication Adjustments/Labs and Tests Ordered: Current  medicines are reviewed at length with the patient today.  Concerns regarding medicines are outlined above. Medication changes, Labs and Tests ordered today are summarized above and listed in the Patient Instructions accessible in Encounters.   Signed, [name], PA-C  12/07/2016 1:03 PM    [facility]  [address], [place], [place]  [address] Phone: [phone]; Fax: [phone]   Patient examined chart reviewed Discussed with patient and PA. Recent DES to mid circumflex. Reviewed films and good angiographic result. Recurring SSCP similar to pre stent and some atypical features at times. Having pain in office ECG unrevealing but was prior to stent due to circumflex location. Claims compliance with DAT. Exam with overweight black male. No murmur /rub clear lungs Previous right radial cath sight healed with plus 3 radial pulse. Have arranged transfer to [facility] for cath and re look today. Patient willing to proceed  Jenkins Rouge

## 2016-12-07 NOTE — H&P (View-Only) (Signed)
Cardiology Office Note    Date:  12/07/2016  ID:  [name], DOB July 15, 1960, MRN [id number] PCP:  [name], DO  Cardiologist:  Dr. Meda Coffee? Previously seen in consult by her 2016, then seen in office by Dr. Lovena Le 2016. Most recently seen by Dr. Martinique in 2018.   Chief Complaint: f/u NSTEMI  History of Present Illness:  [name] is a 56 y.o. male with history of CAD (NSTEMI s/p DES to Cx 11/2016), recently diagnosed chronic diastolic CHF, HTN, HLD, tobacco abuse, GERD, chronic nausea, BPH, cluster headache, h/o drug abuse, pain-med-seeking behavior, thyroid goiter, hypothyroidism, TIA, trigeminal neuralgia, asthma who presents for f/u NSTEMI.  He was remotely cathed in 2010 with nonobstructive disease. He's been struggling with chronic nausea lately with pending endoscopy before recent admission. He was admitted 11/2016 with chest pain, dyspnea, nausea and troponin >65. Cath showed total occlusion of mid Cx s/p PTCA/DES. There were irregularities are noted in the LAD and right coronary but no significant obstructive lesions seen. He did have frequent atypical pain post-cath which was managed medically, felt possibly musculoskeletal. There was suspicion of pain medication seeking behavior. His cath also showed acute diastolic CHF with EF 71% and LVEDP 24, 2D echo 11/28/16 showed moderate LVH, EF 45-50%, hypokinesis of the basal-midlateral, inferolateral, and inferior myocardium, lipomatous hypertrophy, mildly dilated RV. Most recent labs showed K 4.0, Cr 1.08, normal CBC, normal A1c, LDL 199. On day of dc he was reported to have 3/10 discomfort but no pain with ambulation. He was discharged on ASA, Plavix, Imdur, metoprolol, and atorvastatin. He had TOC visit yesterday with PCP which appears unremarkable.  He comes in today report that ever since discharge to have chest pain 2-3x a day which he feels is somewhat similar to recent admitting angina - states it feels like he is  being kicked. It is somewhat worse with position changes like leaning forward, sleeping on his stomach or lifting his arm up. It has woken him out of sleep. He has a difficult time describing it. No SOB, palpitations, LEE, syncope. He thinks he's gained a few lb since discharge - states he's been eating healthier but as a result has been eating more overall. Denies any orthopnea or PND. He is experiencing it in the office 4/10, reported as a discomfort. He has not taken SL NTG because it actually seemed to make the pain worse last admission.   Past Medical History:  Diagnosis Date  . BPH (benign prostatic hyperplasia)   . CAD (coronary artery disease)    a. NSTEMI troponin >65 with cath 11/2016 S/p PTCA & DES to mid circumflex; irregularies in LAD & RCA, LVEDP 24, EF 50% and 45-50% by echo.  . Chronic diastolic CHF (congestive heart failure) ([place])   . Cluster headache    hx of  . Complication of anesthesia    difficult waking up"  . Diverticulosis   . Drug abuse    hx of  . Drug-seeking behavior   . GERD (gastroesophageal reflux disease)   . Hiatal hernia   . Hyperlipidemia   . Hypertension   . Hypothyroidism   . Nontoxic multinodular goiter   . NSTEMI (non-ST elevated myocardial infarction) ([place])   . Pulmonary nodule --- CT 10/19/2014: Nodule is stable, no further routine x-rays 01/06/2009  . TIA (transient ischemic attack)   . Trigeminal neuralgia   . Tubular adenoma of colon 12/2015  . Unspecified asthma(493.90)     Past Surgical History:  Procedure Laterality  Date  . APPENDECTOMY    . bowel obstruction    . CARDIAC CATHETERIZATION N/A 11/25/2014   Procedure: Left Heart Cath and Coronary Angiography;  Surgeon: Belva Crome, MD;  Location: [place];  Service: Cardiovascular;  Laterality: N/A;  . CORONARY STENT INTERVENTION N/A 11/27/2016   Procedure: CORONARY STENT INTERVENTION;  Surgeon: Belva Crome, MD;  Location: [facility];  Service: Cardiovascular;   Laterality: N/A;  . CORONARY STENT PLACEMENT  11/27/2016   STENT XIENCE ALPINE RX 3.0X15 drug eluting stent was successfully placed  . CYST EXCISION Left 10/31/2016   Jaw  . DESTRUCTION TRIGEMINAL NERVE VIA NEUROLYTIC AGENT  x 3 , R side last ~2010  . KNEE ARTHROSCOPY Right   . LEFT HEART CATH AND CORONARY ANGIOGRAPHY N/A 11/27/2016   Procedure: LEFT HEART CATH AND CORONARY ANGIOGRAPHY;  Surgeon: Belva Crome, MD;  Location: [facility];  Service: Cardiovascular;  Laterality: N/A;  . 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 Right Shoulder Redo-Reconstruction  06/21/10    Current Medications: Current Meds  Medication Sig  . aspirin EC 81 MG tablet Take 81 mg by mouth daily.  Marland Kitchen atorvastatin (LIPITOR) 80 MG tablet Take 1 tablet (80 mg total) by mouth at bedtime.  . Cetirizine-Pseudoephedrine (ZYRTEC-D PO) Take 1 tablet by mouth daily.  . chlorhexidine (PERIDEX) 0.12 % solution Use as directed 10 mLs in the mouth or throat 2 (two) times daily. As directed.  . clopidogrel (PLAVIX) 75 MG tablet Take 1 tablet (75 mg total) by mouth daily with breakfast.  . dicyclomine (BENTYL) 20 MG tablet Take 1 tablet (20 mg total) by mouth 3 (three) times daily before meals.  . isosorbide mononitrate (IMDUR) 30 MG 24 hr tablet Take 1 tablet (30 mg total) by mouth daily.  Marland Kitchen loperamide (IMODIUM A-D) 2 MG tablet Take 4 mg by mouth 4 (four) times daily as needed for diarrhea or loose stools.  Marland Kitchen losartan (COZAAR) 100 MG tablet Take 1 tablet (100 mg total) by mouth daily.  . metoprolol tartrate (LOPRESSOR) 25 MG tablet Take 1 tablet (25 mg total) by mouth 2 (two) times daily.  . nitroGLYCERIN (NITROSTAT) 0.4 MG SL tablet Place 1 tablet (0.4 mg total) under the tongue every 5 (five) minutes as needed for chest pain.  Marland Kitchen ondansetron (ZOFRAN) 8 MG tablet Take by mouth 2 (two) times daily.  . pantoprazole (PROTONIX) 40 MG tablet Take 1 tablet (40 mg total) by mouth daily.  .  promethazine (PHENERGAN) 12.5 MG tablet Take 12.5 mg by mouth every 6 (six) hours as needed for nausea or vomiting.  . psyllium (HYDROCIL/METAMUCIL) 95 % PACK Take 1 packet by mouth daily.  . ranitidine (ZANTAC) 150 MG tablet Take 1 tablet (150 mg total) by mouth at bedtime.  . traZODone (DESYREL) 50 MG tablet Take 0.5-1 tablets (25-50 mg total) by mouth at bedtime as needed for sleep.  . [DISCONTINUED] promethazine (PHENERGAN) 12.5 MG tablet Take 1/2 -1 tablet by mouth three times a day as needed (Patient taking differently: Take 1/2 -1 tablet by mouth three times a day as needed for night time nausea)     Allergies:   Patient has no known allergies.   Social History   Social History  . Marital status: Married    Spouse name: N/A  . Number of children: 2  . Years of education: N/A   Occupational History  . Merchandiser, retail [employer]  Social History Main Topics  . Smoking status: Former Smoker    Packs/day: 0.25    Years: 18.00    Types: Cigarettes    Quit date: 11/15/2016  . Smokeless tobacco: Never Used     Comment: smokes about 1 cigarette daily  . Alcohol use Yes     Comment: rare   . Drug use: No  . Sexual activity: Not Asked   Other Topics Concern  . None   Social History Narrative   HSG   Culinary school in [place]   Married - '84 - 5 years divorced; remarried '96   1 son - '85                  Family History:  Family History  Problem Relation Age of Onset  . Diabetes Mother   . Hyperlipidemia Mother   . Hypertension Mother   . Hypertension Father   . Heart disease Father        CAD/CHF  . Cancer Father        Mesothelioma  . Heart disease Maternal Aunt   . Prostate cancer Neg Hx   . Colon cancer Neg Hx     ROS:   Please see the history of present illness. Negative for fevers or chills. All other systems are reviewed and otherwise negative.    PHYSICAL EXAM:   VS:  BP 140/70   Pulse 65   Ht 5\' 10"  (1.778 m)   Wt 235 lb (106.6 kg)    BMI 33.72 kg/m   BMI: Body mass index is 33.72 kg/m. GEN: Well nourished, well developed AAM, obese, in no acute distress  HEENT: normocephalic, atraumatic Neck: no JVD, carotid bruits, or masses Cardiac: RRR; no murmurs, rubs, or gallops, no edema  Respiratory:  clear to auscultation bilaterally, normal work of breathing GI: soft, nontender, nondistended, + BS MS: no deformity or atrophy  Skin: warm and dry, no rash, right radial cath site without hematoma or ecchymosis; good pulse. Neuro:  Alert and Oriented x 3, Strength and sensation are intact, follows commands Psych: euthymic mood, full affect  Wt Readings from Last 3 Encounters:  12/07/16 235 lb (106.6 kg)  12/06/16 230 lb 3.2 oz (104.4 kg)  11/30/16 231 lb 7.7 oz (105 kg)      Studies/Labs Reviewed:   EKG:  EKG was ordered today and personally reviewed by me and demonstrates NSR 66bpm, nonspecific TW changes in III, avF, similar to prior  Recent Labs: 10/09/2016: Magnesium 2.1 11/24/2016: TSH 0.96 11/28/2016: Hemoglobin 14.6; Platelets 153 12/06/2016: ALT 22; BUN 20; Creatinine, Ser 1.08; Potassium 4.0; Sodium 138   Lipid Panel    Component Value Date/Time   CHOL 298 (H) 11/27/2016 0123   TRIG 98 11/27/2016 0123   HDL 79 11/27/2016 0123   CHOLHDL 3.8 11/27/2016 0123   VLDL 20 11/27/2016 0123   LDLCALC 199 (H) 11/27/2016 0123   LDLDIRECT 209.8 07/20/2009 0951    Additional studies/ records that were reviewed today include: Summarized above  Cath 11/27/16 Conclusion   Non-ST elevation myocardial infarction presentation with ongoing chest pain and rising troponin greater than 9.  Total occlusion of the mid circumflex with prolonged pain presentation due to the presence of collaterals.   Successful PCI of the mid circumflex total occlusion reducing the obstruction to less than 20% using a 3.0 Xience Alpine drug-eluting stent to restorie TIMI grade 3 flow. Final postdilatation balloon diameter was 3.5 [place]  balloon.  Irregularities are noted in the  LAD and right coronary but no significant obstructive lesions are seen.  Acute diastolic heart failure with LVEF 50%, and EDP 24 mmHg. RECOMMENDATIONS:  Aspirin and Plavix for at least 6 months and preferably 12.  Aggressive risk factor modification.    ASSESSMENT & PLAN:   1. CAD - he has had recurrent chest pain ever since NSTEMI. Post-cath troponins went from 65 to 6 to 7 then 3. He reports similar discomfort to recent angina. Prior EKG was unhelpful likely due to Cx lesion (troponin >65 initially). Etiology of persistent discomfort not totally clear. There is an atypical component as well. I discussed with Dr. Johnsie Cancel who feels the patient should be re-cathed to definitively assess whether recurrent obstruction is the cause. Risks and benefits of cardiac catheterization have been discussed with the patient.  These include bleeding, infection, kidney damage, stroke, heart attack, death.  The patient understands these risks and is willing to proceed. There are currently no beds available at [facility] so will send to ED but plan to add to cath board today. Spoke with cath lab. Will hold off on NTG as patient previously said this made him feel worse. If cath unrevealing, may need to consider empiric anti-inflammatory in case this represents Dressler's. Check CBC, PT/INR upon arrival to ED. 2. Chronic diastolic CHF - appears euvolemic on exam but recent LVEDP was up. His symptoms are atypical for volume overload but will check BNP; LVEDP will be reassessed by cath. 3. Essential HTN - BP recently 110/70 at PCP office. Follow.  4. Hyperlipidemia - upon future follow-up would arrange f/u lipids/LFTs once acute issues are resolved.  Disposition: F/u to be determined following outcome of above.   Medication Adjustments/Labs and Tests Ordered: Current medicines are reviewed at length with the patient today.  Concerns regarding medicines are outlined above.  Medication changes, Labs and Tests ordered today are summarized above and listed in the Patient Instructions accessible in Encounters.   Signed, [name], PA-C  12/07/2016 10:57 AM    [address], [place], [place]  [address] Phone: [phone]; Fax: [phone]

## 2016-12-07 NOTE — Progress Notes (Signed)
TR BAND REMOVAL  LOCATION:    right radial  DEFLATED PER PROTOCOL:    Yes.    TIME BAND OFF / DRESSING APPLIED:    1800   SITE UPON ARRIVAL:    Level 0  SITE AFTER BAND REMOVAL:    Level 0  CIRCULATION SENSATION AND MOVEMENT:    Within Normal Limits   Yes.    COMMENTS:   Tolerated well, remains tender to touch but soft. No bleeding noted. No hematoma noted. Educated on post removal instructions. Elevated on pillow. No S/S of distress noted or complaints voiced at this time. Teach back effective.

## 2016-12-07 NOTE — ED Notes (Signed)
Patient transported on tele by two RNs to the cath lab.  No distress during transport.  Belongings given to family waiting in the waiting room.  All paper work given to [employer].

## 2016-12-07 NOTE — Patient Instructions (Addendum)
Pt was sent to the ED by ambulance.

## 2016-12-07 NOTE — ED Provider Notes (Signed)
[facility] DEPT Provider Note   CSN: [id number] Arrival date & time: 12/07/16  1301     History   Chief Complaint Chief Complaint  Patient presents with  . Chest Pain    HPI [name] is a 56 y.o. male.  The history is provided by the patient and medical records.  Chest Pain   This is a new problem. The current episode started more than 2 days ago. The problem occurs constantly. The problem has not changed since onset.The pain is associated with exertion. The pain is present in the substernal region. The pain is at a severity of 6/10. The pain is moderate. The quality of the pain is described as exertional, heavy and pressure-like. The pain does not radiate. Duration of episode(s) is 3 days. The symptoms are aggravated by exertion. Associated symptoms include nausea and shortness of breath. Pertinent negatives include no abdominal pain, no back pain, no cough, no diaphoresis, no dizziness, no fever, no headaches, no hemoptysis and no sputum production. He has tried nothing for the symptoms. The treatment provided no relief. Risk factors include male gender.  His past medical history is significant for CAD and MI.    Past Medical History:  Diagnosis Date  . BPH (benign prostatic hyperplasia)   . CAD (coronary artery disease)    a. NSTEMI troponin >65 with cath 11/2016 S/p PTCA & DES to mid circumflex; irregularies in LAD & RCA, LVEDP 24, EF 50% and 45-50% by echo.  . Chronic diastolic CHF (congestive heart failure) ([place])   . Cluster headache    hx of  . Complication of anesthesia    difficult waking up"  . Diverticulosis   . Drug abuse    hx of  . Drug-seeking behavior   . GERD (gastroesophageal reflux disease)   . Hiatal hernia   . Hyperlipidemia   . Hypertension   . Hypothyroidism   . Nontoxic multinodular goiter   . NSTEMI (non-ST elevated myocardial infarction) ([place])   . Pulmonary nodule --- CT 10/19/2014: Nodule is stable, no further routine x-rays 01/06/2009   . TIA (transient ischemic attack)   . Trigeminal neuralgia   . Tubular adenoma of colon 12/2015  . Unspecified asthma(493.90)     Patient Active Problem List   Diagnosis Date Noted  . Chest pain 12/07/2016  . NSTEMI (non-ST elevated myocardial infarction) ([place]) 11/26/2016  . Intractable nausea and vomiting 10/09/2016  . Nausea vomiting and diarrhea 10/09/2016  . Enteritis due to Clostridium difficile 10/30/2015  . Malnutrition of moderate degree 10/21/2015  . Acute kidney injury ([place]) 10/20/2015  . PCP NOTES >>>>>>>>>>>>>>>>> 09/30/2015  . Hypercholesteremia 09/22/2015  . Renal mass, left 11/02/2014  . CAD (coronary artery disease)   . Drug abuse and dependence ([place]) 09/18/2014  . Visit for preventive health examination 02/04/2014  . Screening for ischemic heart disease 02/04/2014  . BPH associated with nocturia 12/24/2013  . Hypothyroidism 12/24/2013  . Chronic ethmoidal sinusitis 12/24/2013  . Low back pain radiating to right leg 05/10/2012  . Thrombocytopenia ([place]) 12/05/2011  . Tobacco use 11/30/2011  . Anxiety 12/26/2010  . Transient cerebral ischemia 01/07/2010  . Asthma 01/19/2009  . Pulmonary nodule --- CT 10/19/2014: Nodule is stable, no further routine x-rays 01/06/2009  . Nontoxic multinodular goiter 12/31/2008  . DYSPNEA ON EXERTION 12/15/2008  . Trigeminal neuralgia 07/24/2007  . Dyslipidemia 12/27/2006  . Cluster headache 12/27/2006  . Essential hypertension 12/27/2006  . GERD 12/27/2006    Past Surgical History:  Procedure Laterality Date  .  APPENDECTOMY    . bowel obstruction    . CARDIAC CATHETERIZATION N/A 11/25/2014   Procedure: Left Heart Cath and Coronary Angiography;  Surgeon: Belva Crome, MD;  Location: [facility];  Service: Cardiovascular;  Laterality: N/A;  . CORONARY STENT INTERVENTION N/A 11/27/2016   Procedure: CORONARY STENT INTERVENTION;  Surgeon: Belva Crome, MD;  Location: [facility];  Service: Cardiovascular;   Laterality: N/A;  . CORONARY STENT PLACEMENT  11/27/2016   STENT XIENCE ALPINE RX 3.0X15 drug eluting stent was successfully placed  . CYST EXCISION Left 10/31/2016   Jaw  . DESTRUCTION TRIGEMINAL NERVE VIA NEUROLYTIC AGENT  x 3 , R side last ~2010  . KNEE ARTHROSCOPY Right   . LEFT HEART CATH AND CORONARY ANGIOGRAPHY N/A 11/27/2016   Procedure: LEFT HEART CATH AND CORONARY ANGIOGRAPHY;  Surgeon: Belva Crome, MD;  Location: [facility];  Service: Cardiovascular;  Laterality: N/A;  . 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 Right Shoulder Redo-Reconstruction  06/21/10       Home Medications    Prior to Admission medications   Medication Sig Start Date End Date Taking? Authorizing Provider  aspirin EC 81 MG tablet Take 81 mg by mouth daily.    [provider]  atorvastatin (LIPITOR) 80 MG tablet Take 1 tablet (80 mg total) by mouth at bedtime. 11/30/16   Bhagat, Crista Luria, PA  Cetirizine-Pseudoephedrine (ZYRTEC-D PO) Take 1 tablet by mouth daily.    [provider]  chlorhexidine (PERIDEX) 0.12 % solution Use as directed 10 mLs in the mouth or throat 2 (two) times daily. As directed. 10/23/16   [provider]  clopidogrel (PLAVIX) 75 MG tablet Take 1 tablet (75 mg total) by mouth daily with breakfast. 11/30/16   Bhagat, Bhavinkumar, PA  dicyclomine (BENTYL) 20 MG tablet Take 1 tablet (20 mg total) by mouth 3 (three) times daily before meals. 10/13/16   [name], DO  isosorbide mononitrate (IMDUR) 30 MG 24 hr tablet Take 1 tablet (30 mg total) by mouth daily. 11/30/16   Bhagat, Crista Luria, PA  loperamide (IMODIUM A-D) 2 MG tablet Take 4 mg by mouth 4 (four) times daily as needed for diarrhea or loose stools.    [provider]  losartan (COZAAR) 100 MG tablet Take 1 tablet (100 mg total) by mouth daily. 12/06/16   [name], DO  metoprolol tartrate (LOPRESSOR) 25 MG tablet Take 1 tablet (25 mg  total) by mouth 2 (two) times daily. 11/30/16   Bhagat, Crista Luria, PA  nitroGLYCERIN (NITROSTAT) 0.4 MG SL tablet Place 1 tablet (0.4 mg total) under the tongue every 5 (five) minutes as needed for chest pain. 12/06/16   [name], DO  ondansetron (ZOFRAN) 8 MG tablet Take by mouth 2 (two) times daily.    [provider]  pantoprazole (PROTONIX) 40 MG tablet Take 1 tablet (40 mg total) by mouth daily. 11/30/16   Bhagat, Crista Luria, PA  promethazine (PHENERGAN) 12.5 MG tablet Take 12.5 mg by mouth every 6 (six) hours as needed for nausea or vomiting.    [provider]  psyllium (HYDROCIL/METAMUCIL) 95 % PACK Take 1 packet by mouth daily.    [provider]  ranitidine (ZANTAC) 150 MG tablet Take 1 tablet (150 mg total) by mouth at bedtime. 11/13/16 11/13/17  Ladene Artist, MD  traZODone (DESYREL) 50 MG tablet Take 0.5-1 tablets (25-50 mg total) by mouth at bedtime  as needed for sleep. 09/05/16   Debbrah Alar, NP    Family History Family History  Problem Relation Age of Onset  . Diabetes Mother   . Hyperlipidemia Mother   . Hypertension Mother   . Hypertension Father   . Heart disease Father        CAD/CHF  . Cancer Father        Mesothelioma  . Heart disease Maternal Aunt   . Prostate cancer Neg Hx   . Colon cancer Neg Hx     Social History Social History  Substance Use Topics  . Smoking status: Former Smoker    Packs/day: 0.25    Years: 18.00    Types: Cigarettes    Quit date: 11/15/2016  . Smokeless tobacco: Never Used     Comment: smokes about 1 cigarette daily  . Alcohol use Yes     Comment: rare      Allergies   Patient has no known allergies.   Review of Systems Review of Systems  Constitutional: Negative for appetite change, chills, diaphoresis and fever.  HENT: Negative for congestion.   Eyes: Negative for visual disturbance.  Respiratory: Positive for shortness of breath. Negative for cough, hemoptysis, sputum  production, chest tightness, wheezing and stridor.   Cardiovascular: Positive for chest pain.  Gastrointestinal: Positive for nausea. Negative for abdominal pain and diarrhea.  Genitourinary: Negative for dysuria.  Musculoskeletal: Negative for back pain, neck pain and neck stiffness.  Skin: Negative for rash and wound.  Neurological: Negative for dizziness and headaches.  Psychiatric/Behavioral: Negative for agitation and confusion.  All other systems reviewed and are negative.    Physical Exam Updated Vital Signs Ht 5\' 10"  (1.778 m)   Wt 104.3 kg (230 lb)   SpO2 98%   BMI 33.00 kg/m   Physical Exam  Constitutional: He is oriented to person, place, and time. He appears well-developed and well-nourished. No distress.  HENT:  Head: Normocephalic and atraumatic.  Mouth/Throat: Oropharynx is clear and moist. No oropharyngeal exudate.  Eyes: Pupils are equal, round, and reactive to light. Conjunctivae and EOM are normal.  Neck: Normal range of motion. Neck supple.  Cardiovascular: Normal rate, regular rhythm and intact distal pulses.   No murmur heard. Pulmonary/Chest: Effort normal and breath sounds normal. No stridor. No respiratory distress. He has no wheezes. He exhibits no tenderness.  Abdominal: Soft. There is no tenderness.  Musculoskeletal: He exhibits no edema or tenderness.  Neurological: He is alert and oriented to person, place, and time. No cranial nerve deficit or sensory deficit. He exhibits normal muscle tone.  Skin: Skin is warm and dry. Capillary refill takes less than 2 seconds. He is not diaphoretic. No erythema. No pallor.  Psychiatric: He has a normal mood and affect.  Nursing note and vitals reviewed.    ED Treatments / Results  Labs (all labs ordered are listed, but only abnormal results are displayed) Labs Reviewed  TROPONIN I - Abnormal; Notable for the following:       Result Value   Troponin I 0.07 (*)    All other components within normal limits    CBC  BASIC METABOLIC PANEL  PROTIME-INR  BRAIN NATRIURETIC PEPTIDE  CBC  BASIC METABOLIC PANEL    EKG  EKG Interpretation  Date/Time:  Thursday December 07 2016 13:06:04 EDT Ventricular Rate:  60 PR Interval:  182 QRS Duration: 92 QT Interval:  374 QTC Calculation: 374 R Axis:   75 Text Interpretation:  Normal sinus rhythm Abnormal  QRS-T angle, consider primary T wave abnormality Abnormal ECG When compared to prior, possible t wave inversion in lead 3 and AVF. No STEMI Confirmed by Antony Blackbird [phone]) on 12/07/2016 7:35:36 PM       Radiology Dg Chest 2 View  Result Date: 12/07/2016 CLINICAL DATA:  Left-sided chest pain for 1 week. EXAM: CHEST  2 VIEW COMPARISON:  November 26, 2016 FINDINGS: Mild atelectasis in the left base has increased in the interval. The heart, hila, mediastinum, lungs, and pleura are otherwise unremarkable. IMPRESSION: Mild increased atelectasis in the left base. No other acute abnormalities. Electronically Signed   By: Dorise Bullion III M.D   On: 12/07/2016 20:42    Procedures Procedures (including critical care time)  Medications Ordered in ED Medications  promethazine (PHENERGAN) tablet 12.5 mg (not administered)  losartan (COZAAR) tablet 100 mg (not administered)  nitroGLYCERIN (NITROSTAT) SL tablet 0.4 mg (not administered)  atorvastatin (LIPITOR) tablet 80 mg (not administered)  clopidogrel (PLAVIX) tablet 75 mg (not administered)  isosorbide mononitrate (IMDUR) 24 hr tablet 30 mg (not administered)  metoprolol tartrate (LOPRESSOR) tablet 25 mg (not administered)  pantoprazole (PROTONIX) EC tablet 40 mg (not administered)  psyllium (HYDROCIL/METAMUCIL) packet 1 packet (not administered)  aspirin EC tablet 81 mg (not administered)  ondansetron (ZOFRAN) tablet 8 mg (not administered)  famotidine (PEPCID) tablet 20 mg (not administered)  dicyclomine (BENTYL) tablet 20 mg (not administered)  traZODone (DESYREL) tablet 25-50 mg (not administered)   aspirin chewable tablet 324 mg (not administered)    Or  aspirin suppository 300 mg (not administered)  acetaminophen (TYLENOL) tablet 650 mg (not administered)  ondansetron (ZOFRAN) injection 4 mg (not administered)     Initial Impression / Assessment and Plan / ED Course  I have reviewed the triage vital signs and the nursing notes.  Pertinent labs & imaging results that were available during my care of the patient were reviewed by me and considered in my medical decision making (see chart for details).     [name] is a 56 y.o. male With a past medical history significant for hypertension, asthma, and CAD with recent NSTEMI last week who presents from his cardiologist office for chest pain. Patient reports that he was told by his cardiologist to come get admitted for heart catheterization. Patient says he has had several days of chest pain similar to his pain with his heart attack. He describes an aching pain and essential chest. He reports some nausea but no diaphoresis. He reports an interim and shortness of breath. He says rest makes it better. He said that nitroglycerin made it worse. He denies other complaints.  On exam, chest is nontender. Lungs are clear. Abdomen was nontender. No focal neurologic deficits.  EKG has slight t wave inversions in 3 and AVF from prior. Patient had screening laboratory testing performed.   Cardiology already aware of patient and will admit. Patient admitted without further problem for further management and heart catheterization today. Patient admitted in stable condition.   Final Clinical Impressions(s) / ED Diagnoses   Final diagnoses:  Chest pain    Clinical Impression: 1. Chest pain     Disposition: Admit to Cardiology    Chad Donoghue, Gwenyth Allegra, MD 12/07/16 2132

## 2016-12-07 NOTE — ED Triage Notes (Signed)
Arrived via EMS from [facility] office. Patient has cardiac stent placed and patient states since then chest pain radiating to left shoulder intermittent. Seen at [facility] office for check up and to be cleared to go back to work. Sent to the ED for evaluation. EMS administered 1 nitro SL and patient took aspirin 81 mg PO prior to arrival.

## 2016-12-08 ENCOUNTER — Encounter (HOSPITAL_COMMUNITY): Payer: Self-pay | Admitting: Internal Medicine

## 2016-12-08 ENCOUNTER — Telehealth: Payer: Self-pay | Admitting: Physician Assistant

## 2016-12-08 DIAGNOSIS — I208 Other forms of angina pectoris: Secondary | ICD-10-CM | POA: Diagnosis not present

## 2016-12-08 DIAGNOSIS — I2583 Coronary atherosclerosis due to lipid rich plaque: Secondary | ICD-10-CM

## 2016-12-08 DIAGNOSIS — I2510 Atherosclerotic heart disease of native coronary artery without angina pectoris: Secondary | ICD-10-CM | POA: Diagnosis not present

## 2016-12-08 DIAGNOSIS — I5032 Chronic diastolic (congestive) heart failure: Secondary | ICD-10-CM

## 2016-12-08 DIAGNOSIS — E669 Obesity, unspecified: Secondary | ICD-10-CM

## 2016-12-08 LAB — BASIC METABOLIC PANEL
Anion gap: 9 (ref 5–15)
BUN: 19 mg/dL (ref 6–20)
CO2: 25 mmol/L (ref 22–32)
Calcium: 8.9 mg/dL (ref 8.9–10.3)
Chloride: 104 mmol/L (ref 101–111)
Creatinine, Ser: 1.25 mg/dL — ABNORMAL HIGH (ref 0.61–1.24)
GFR calc Af Amer: >60 mL/min (ref >60)
GFR calc non Af Amer: >60 mL/min (ref >60)
Glucose, Bld: 87 mg/dL (ref 65–99)
Potassium: 4.1 mmol/L (ref 3.5–5.1)
Sodium: 138 mmol/L (ref 135–145)

## 2016-12-08 LAB — CBC
HCT: 42.7 % (ref 39.0–52.0)
Hemoglobin: 14.5 g/dL (ref 13.0–17.0)
MCH: 31.5 pg (ref 26.0–34.0)
MCHC: 34 g/dL (ref 30.0–36.0)
MCV: 92.8 fL (ref 78.0–100.0)
Platelets: 174 10*3/uL (ref 150–400)
RBC: 4.6 MIL/uL (ref 4.22–5.81)
RDW: 13.6 % (ref 11.5–15.5)
WBC: 7.2 10*3/uL (ref 4.0–10.5)

## 2016-12-08 MED ORDER — COLCHICINE 0.6 MG PO TABS
0.6000 mg | ORAL_TABLET | Freq: Two times a day (BID) | ORAL | Status: DC
  Administered 2016-12-08: 0.6 mg via ORAL
  Filled 2016-12-08: qty 1

## 2016-12-08 MED ORDER — COLCHICINE 0.6 MG PO TABS: 0.6000 mg | ORAL_TABLET | Freq: Two times a day (BID) | ORAL | 1 refills | Status: DC

## 2016-12-08 MED FILL — Heparin Sodium (Porcine) 2 Unit/ML in Sodium Chloride 0.9%: INTRAMUSCULAR | Qty: 500 | Status: AC

## 2016-12-08 MED FILL — Verapamil HCl IV Soln 2.5 MG/ML: INTRAVENOUS | Qty: 2 | Status: AC

## 2016-12-08 NOTE — Discharge Summary (Signed)
Discharge Summary    Patient ID: [name],  MRN: [id number], DOB/AGE: Oct 17, 1960 56 y.o.  Admit date: 12/07/2016 Discharge date: 12/08/2016  Primary Care Provider: [name] Primary Cardiologist: Dr. Meda Coffee  Discharge Diagnoses    Principal Problem:   Atypical chest pain Active Problems:   Essential hypertension   GERD   CAD (coronary artery disease)   Hypercholesteremia   Obesity   Chronic diastolic CHF (congestive heart failure) (HCC)    Diagnostic Studies/Procedures    Procedures   LEFT HEART CATH AND CORONARY ANGIOGRAPHY  Conclusion   Conclusions: 1. Mild to moderate, non-obstructive coronary artery disease similar to conclusion of prior catheterization on 11/27/16. 2. Widely patent LCx/OM stent. 3. Mildly elevated left ventricular filling pressure with LVEF 45%.  Recommendations: 1. Continue DAPT with ASA and clopidogrel x 1 year. 2. Start isosorbide mononitrate (as patient was not taking this medication at home after recent hospital discharge). 3. Begin furosemide for possible component of heart failure. 4. If pain continues despite aforementioned interventions, consider empiric treatment for pericarditis, given recent MI.    _____________     History of Present Illness     [name] is a 57 y.o. male with history of CAD (NSTEMI s/p DES to Cx 11/2016), recently diagnosed chronic diastolic CHF, HTN, HLD, tobacco abuse, GERD, chronic nausea, BPH, cluster headache, h/o drug abuse, pain-med-seeking behavior, thyroid goiter, hypothyroidism, TIA, trigeminal neuralgia, asthma who presents for f/u NSTEMI.  He was remotely cathed in 2010 with nonobstructive disease. He's been struggling with chronic nausea lately with pending endoscopy before recent admission. He was admitted 11/2016 with chest pain, dyspnea, nausea and troponin >65. Cath showed total occlusion of mid Cx s/p PTCA/DES. There were irregularities are noted in the LAD and right  coronary but no significant obstructive lesions seen. He did have frequent atypical pain post-cath which was managed medically, felt possibly musculoskeletal. There was suspicion of pain medication seeking behavior. His cath also suggested acute diastolic CHF with EF 84% and LVEDP 24, 2D echo 11/28/16 showed moderate LVH, EF 45-50%, hypokinesis of the basal-midlateral, inferolateral, and inferior myocardium, lipomatous hypertrophy, mildly dilated RV. Most recent labs showed K 4.0, Cr 1.08, normal CBC, normal A1c, LDL 199. On day of dc he was reported to have 3/10 discomfort but no pain with ambulation. He was discharged on ASA, Plavix, Imdur, metoprolol, and atorvastatin. He had TOC visit 12/06/16 with PCP which appeared unremarkable.   He was seen in clinic yesterday reporting that ever since discharge, he continued to have chest pain 2-3x a day which he feels is somewhat similar to recent admitting angina - states it feels like he is being kicked. There were also very atypical features such as an electric shock sensation occasionally, also sometimes worse with position changes like leaning forward, sleeping on his stomach or lifting his arm up. He reported compliance with meds. Felt different than prior GERD. No SOB, palpitations, LEE, syncope. Due to persistent pain including ongoing discomfort in the office, he was admitted for relook cath.  Hospital Course    1. Recurrent chest pain with mixed atypical/typical features, CAD - troponin was 0.07, significantly decreased from recent NSTEMI. WBC normal, cath unrevealing - showed mild to moderate, non-obstructive coronary artery disease similar to conclusion of prior catheterization on 11/27/16, widely patent LCx/OM stent, mildly elevated LV filling pressure with EF 45%. He was given a dose of Lasix post-cath without significant difference in symptoms. He did have good UOP. BNP was totally normal and  CXR without edema. Given that this did not affect symptoms and  dropped BP to borderline low, he was not sent home on this. BP has since come back up. He was also started on empiric colchicine in case his atypical symptoms reflected possibility of pericarditis. He was also asked to f/u PCP for evaluation of noncardiac causes of chest pain. Can reassess need for ongoing colchicine at f/u OV.  2. Chronic diastolic CHF - LVEDP was 24 by original cath, yesterday was 20 with EF 45%. BNP was totally normal. He appears euvolemic on exam and did not see any difference whatsoever with Lasix. Can consider PRN as outpatient if needed. HF lifestyle information provided. Cr was 1.25 today post-Lasix. Can consider f/u as OP.  3. Essential HTN - generally controlled.  4. Hyperlipidemia - upon future follow-up would arrange f/u lipids/LFTs once acute issues are resolved.  Work note given to remain out of work until cleared. F/u arranged. Dr. Gwenlyn Found has seen and examined the patient today and feels she is stable for discharge.  _____________  Discharge Vitals Blood pressure 138/87, pulse 62, temperature 98.1 F (36.7 C), temperature source Oral, resp. rate 18, height 5\' 10"  (1.778 m), weight 231 lb 9.6 oz (105.1 kg), SpO2 96 %.  Filed Weights   12/07/16 1313 12/08/16 0105  Weight: 230 lb (104.3 kg) 231 lb 9.6 oz (105.1 kg)    Labs & Radiologic Studies    CBC  Recent Labs  12/07/16 1402 12/08/16 0408  WBC 8.6 7.2  HGB 15.2 14.5  HCT 43.9 42.7  MCV 91.6 92.8  PLT 178 703   Basic Metabolic Panel  Recent Labs  12/07/16 1402 12/08/16 0408  NA 139 138  K 4.4 4.1  CL 106 104  CO2 24 25  GLUCOSE 99 87  BUN 19 19  CREATININE 1.20 1.25*  CALCIUM 9.1 8.9   Liver Function Tests  Recent Labs  12/06/16 0805  AST 18  ALT 22  ALKPHOS 41  BILITOT 0.4  PROT 6.6  ALBUMIN 4.2   No results for input(s): LIPASE, AMYLASE in the last 72 hours. Cardiac Enzymes  Recent Labs  12/07/16 1402  TROPONINI 0.07*   _____________  Dg Chest 2 View  Result  Date: 12/07/2016 CLINICAL DATA:  Left-sided chest pain for 1 week. EXAM: CHEST  2 VIEW COMPARISON:  November 26, 2016 FINDINGS: Mild atelectasis in the left base has increased in the interval. The heart, hila, mediastinum, lungs, and pleura are otherwise unremarkable. IMPRESSION: Mild increased atelectasis in the left base. No other acute abnormalities. Electronically Signed   By: Dorise Bullion III M.D   On: 12/07/2016 20:42   Dg Chest 2 View  Result Date: 11/26/2016 CLINICAL DATA:  Acute onset of chest pain since this morning beneath left arm. Dizziness and hypertension. Smoker. EXAM: CHEST  2 VIEW COMPARISON:  10/29/2015 CXR, 10/19/2014 CT FINDINGS: The heart size and mediastinal contours are within normal limits. Both lungs are clear. Nodular density seen on remote CT is not radiographically apparent. The visualized skeletal structures are unremarkable with partially included right shoulder arthroplasty noted. IMPRESSION: No active cardiopulmonary disease. Electronically Signed   By: Ashley Royalty M.D.   On: 11/26/2016 19:24   Ct Angio Chest Aorta W And/or Wo Contrast  Result Date: 11/26/2016 CLINICAL DATA:  Acute onset of left-sided chest pain. Nausea and belching. Initial encounter. EXAM: CT ANGIOGRAPHY CHEST, ABDOMEN AND PELVIS TECHNIQUE: Multidetector CT imaging through the chest, abdomen and pelvis was performed using the standard  protocol during bolus administration of intravenous contrast. Multiplanar reconstructed images and MIPs were obtained and reviewed to evaluate the vascular anatomy. CONTRAST:  100 mL of Isovue 370 IV contrast COMPARISON:  Chest radiograph performed earlier today at 7:01 p.m., and CTA of the chest performed 10/19/2014 FINDINGS: CTA CHEST FINDINGS Cardiovascular: There is no evidence of aortic dissection. There is no evidence of aneurysmal dilatation. No calcific atherosclerotic disease is seen along the thoracic aorta. The great vessels are grossly unremarkable in appearance.  There is no evidence of significant pulmonary embolus. The heart remains normal in size. Scattered coronary artery calcifications are seen. Mediastinum/Nodes: The mediastinum is unremarkable in appearance. No mediastinal lymphadenopathy is seen. No pericardial effusion is identified. A 3.7 cm left thyroid hypodensity is noted. A 1.6 cm right thyroid hypodensity is seen. These appear relatively stable from studies dating back to 2012, and are likely benign. No axillary lymphadenopathy is appreciated. Lungs/Pleura: Minimal bilateral dependent subsegmental atelectasis is noted. No pleural effusion or pneumothorax is seen. No masses are identified. Musculoskeletal: No acute osseous abnormalities are identified. The patient's right shoulder arthroplasty is incompletely imaged, but appears grossly unremarkable. The visualized musculature is unremarkable in appearance. Review of the MIP images confirms the above findings. CTA ABDOMEN AND PELVIS FINDINGS VASCULAR Aorta: There is no evidence of aortic dissection. There is no evidence of aneurysmal dilatation. Mild scattered calcification is seen along the abdominal aorta and its branches. Celiac: The celiac trunk appears fully patent. SMA: The superior mesenteric artery remains fully patent. Renals: The renal arteries appear patent bilaterally. Two right-sided renal arteries are seen. IMA: The inferior mesenteric artery remains patent. Inflow: Scattered calcification is noted along the common and internal iliac arteries bilaterally, and along the common femoral arteries bilaterally. Veins: Visualized venous structures are grossly unremarkable, though difficult to fully assess given the phase of contrast enhancement. Review of the MIP images confirms the above findings. NON-VASCULAR Hepatobiliary: The liver is unremarkable in appearance. The gallbladder is unremarkable in appearance. The common bile duct remains normal in caliber. Pancreas: The pancreas is within normal  limits. Spleen: The spleen is unremarkable in appearance. Adrenals/Urinary Tract: The adrenal glands are unremarkable in appearance. Scattered left renal cysts are seen. There is no evidence of hydronephrosis. No renal or ureteral stones are identified. No perinephric stranding is seen. A 1.5 cm heterogeneous nodule at the lateral aspect of the left kidney is grossly stable from 2013 and likely benign. Stomach/Bowel: The stomach is unremarkable in appearance. The small bowel is within normal limits. The appendix is normal in caliber, without evidence of appendicitis. Mild scattered diverticulosis is noted along the descending colon, without evidence of diverticulitis. Lymphatic: No retroperitoneal or pelvic sidewall lymphadenopathy is seen. Reproductive: The bladder is decompressed and not well assessed. The prostate remains normal in size. Other: No additional soft tissue abnormalities are seen. Musculoskeletal: No acute osseous abnormalities are identified. The visualized musculature is unremarkable in appearance. Review of the MIP images confirms the above findings. IMPRESSION: 1. No evidence of aortic dissection. No evidence of aneurysmal dilatation. Mild scattered aortic atherosclerosis. 2. No evidence of significant pulmonary embolus. 3. Stable thyroid hypodensities are likely benign. 4. Minimal bilateral dependent subsegmental atelectasis noted. Lungs otherwise clear. 5. Scattered left renal cysts. 6. 1.5 cm heterogeneous nodule at the lateral aspect of the left kidney is grossly stable from 2013 and likely benign. 7. Mild diverticulosis along the descending colon, without evidence of diverticulitis. Electronically Signed   By: Garald Balding M.D.   On: 11/26/2016 21:54  Ct Angio Abd/pel W And/or Wo Contrast  Result Date: 11/26/2016 CLINICAL DATA:  Acute onset of left-sided chest pain. Nausea and belching. Initial encounter. EXAM: CT ANGIOGRAPHY CHEST, ABDOMEN AND PELVIS TECHNIQUE: Multidetector CT  imaging through the chest, abdomen and pelvis was performed using the standard protocol during bolus administration of intravenous contrast. Multiplanar reconstructed images and MIPs were obtained and reviewed to evaluate the vascular anatomy. CONTRAST:  100 mL of Isovue 370 IV contrast COMPARISON:  Chest radiograph performed earlier today at 7:01 p.m., and CTA of the chest performed 10/19/2014 FINDINGS: CTA CHEST FINDINGS Cardiovascular: There is no evidence of aortic dissection. There is no evidence of aneurysmal dilatation. No calcific atherosclerotic disease is seen along the thoracic aorta. The great vessels are grossly unremarkable in appearance. There is no evidence of significant pulmonary embolus. The heart remains normal in size. Scattered coronary artery calcifications are seen. Mediastinum/Nodes: The mediastinum is unremarkable in appearance. No mediastinal lymphadenopathy is seen. No pericardial effusion is identified. A 3.7 cm left thyroid hypodensity is noted. A 1.6 cm right thyroid hypodensity is seen. These appear relatively stable from studies dating back to 2012, and are likely benign. No axillary lymphadenopathy is appreciated. Lungs/Pleura: Minimal bilateral dependent subsegmental atelectasis is noted. No pleural effusion or pneumothorax is seen. No masses are identified. Musculoskeletal: No acute osseous abnormalities are identified. The patient's right shoulder arthroplasty is incompletely imaged, but appears grossly unremarkable. The visualized musculature is unremarkable in appearance. Review of the MIP images confirms the above findings. CTA ABDOMEN AND PELVIS FINDINGS VASCULAR Aorta: There is no evidence of aortic dissection. There is no evidence of aneurysmal dilatation. Mild scattered calcification is seen along the abdominal aorta and its branches. Celiac: The celiac trunk appears fully patent. SMA: The superior mesenteric artery remains fully patent. Renals: The renal arteries appear  patent bilaterally. Two right-sided renal arteries are seen. IMA: The inferior mesenteric artery remains patent. Inflow: Scattered calcification is noted along the common and internal iliac arteries bilaterally, and along the common femoral arteries bilaterally. Veins: Visualized venous structures are grossly unremarkable, though difficult to fully assess given the phase of contrast enhancement. Review of the MIP images confirms the above findings. NON-VASCULAR Hepatobiliary: The liver is unremarkable in appearance. The gallbladder is unremarkable in appearance. The common bile duct remains normal in caliber. Pancreas: The pancreas is within normal limits. Spleen: The spleen is unremarkable in appearance. Adrenals/Urinary Tract: The adrenal glands are unremarkable in appearance. Scattered left renal cysts are seen. There is no evidence of hydronephrosis. No renal or ureteral stones are identified. No perinephric stranding is seen. A 1.5 cm heterogeneous nodule at the lateral aspect of the left kidney is grossly stable from 2013 and likely benign. Stomach/Bowel: The stomach is unremarkable in appearance. The small bowel is within normal limits. The appendix is normal in caliber, without evidence of appendicitis. Mild scattered diverticulosis is noted along the descending colon, without evidence of diverticulitis. Lymphatic: No retroperitoneal or pelvic sidewall lymphadenopathy is seen. Reproductive: The bladder is decompressed and not well assessed. The prostate remains normal in size. Other: No additional soft tissue abnormalities are seen. Musculoskeletal: No acute osseous abnormalities are identified. The visualized musculature is unremarkable in appearance. Review of the MIP images confirms the above findings. IMPRESSION: 1. No evidence of aortic dissection. No evidence of aneurysmal dilatation. Mild scattered aortic atherosclerosis. 2. No evidence of significant pulmonary embolus. 3. Stable thyroid hypodensities  are likely benign. 4. Minimal bilateral dependent subsegmental atelectasis noted. Lungs otherwise clear. 5. Scattered left  renal cysts. 6. 1.5 cm heterogeneous nodule at the lateral aspect of the left kidney is grossly stable from 2013 and likely benign. 7. Mild diverticulosis along the descending colon, without evidence of diverticulitis. Electronically Signed   By: Garald Balding M.D.   On: 11/26/2016 21:54   Disposition   Pt is being discharged home today in good condition.  Follow-up Plans & Appointments    Follow-up Information    [name], PA-C Follow up.   Specialties:  Cardiology, Radiology Why:  [facility] - 12/18/16 at 11am. Please arrive 15 minutes early to check in. Contact information: [address] [place] [address] [phone]        [name], DO Follow up.   Specialty:  Family Medicine Why:  To discuss evaluation of non-heart causes of chest pain  Contact information: [place] [id number] [phone]          Discharge Instructions    Diet - low sodium heart healthy    Complete by:  As directed    Increase activity slowly    Complete by:  As directed    Patients with heart disease should avoid stimulant-type medicines. This includes medicines like cold/allergy medicines that contain pseudoephedrine or phenylephrine. (Sometimes on the bottle it will say "-D" to indicate the decongestant, which you'll need to avoid.) This is why your Zyrtec-D was discontinued. You can take regular Zyrtec without the decongestant.  You were started on a medicine called colchicine in case your chest pain represents atypical inflammation of the heart that can sometimes happen in rare cases after a heart attack. We will re-evaluate at your follow-up visit whether or not you need to stay on this.  No driving for 2 days. No lifting over 5 lbs for 1 week. No sexual activity for 1 week. You may not return  to work until cleared by cardiology. Keep procedure site clean & dry. If you notice increased pain, swelling, bleeding or pus, call/return!  You may shower, but no soaking baths/hot tubs/pools for 1 week.  One of your heart tests showed weakness of the heart muscle from your recent heart attack (same as last admission). This may make you more susceptible to weight gain from fluid retention, which can lead to symptoms that we call heart failure. Please follow these special instructions:  1. Follow a low-salt diet - you are allowed no more than 2,000mg  of sodium per day. Watch your fluid intake. In general, you should not be taking in more than 2 liters of fluid per day (no more than 8 glasses per day). This includes sources of water in foods like soup, coffee, tea, milk, etc. 2. Weigh yourself on the same scale at same time of day and keep a log. 3. Call your doctor: (Anytime you feel any of the following symptoms)  - 3lb weight gain overnight or 5lb within a few days - Shortness of breath, with or without a dry hacking cough  - Swelling in the hands, feet or stomach  - If you have to sleep on extra pillows at night in order to breathe   IT IS IMPORTANT TO LET YOUR DOCTOR KNOW EARLY ON IF YOU ARE HAVING SYMPTOMS SO WE CAN HELP YOU!      Discharge Medications   Allergies as of 12/08/2016   No Known Allergies     Medication List    STOP taking these medications   lansoprazole 30  MG capsule Commonly known as:  PREVACID   ZYRTEC-D PO     TAKE these medications   acetaminophen 500 MG tablet Commonly known as:  TYLENOL Take 1,000 mg by mouth every 6 (six) hours as needed for mild pain or moderate pain.   aspirin EC 81 MG tablet Take 81 mg by mouth daily.   atorvastatin 80 MG tablet Commonly known as:  LIPITOR Take 1 tablet (80 mg total) by mouth at bedtime.   chlorhexidine 0.12 % solution Commonly known as:  PERIDEX Use as directed 10 mLs in the mouth or throat 2 (two) times  daily. As directed.   clopidogrel 75 MG tablet Commonly known as:  PLAVIX Take 1 tablet (75 mg total) by mouth daily with breakfast.   colchicine 0.6 MG tablet Take 1 tablet (0.6 mg total) by mouth 2 (two) times daily.   dicyclomine 20 MG tablet Commonly known as:  BENTYL Take 1 tablet (20 mg total) by mouth 3 (three) times daily before meals.   isosorbide mononitrate 30 MG 24 hr tablet Commonly known as:  IMDUR Take 1 tablet (30 mg total) by mouth daily.   losartan 100 MG tablet Commonly known as:  COZAAR Take 1 tablet (100 mg total) by mouth daily.   metoprolol tartrate 25 MG tablet Commonly known as:  LOPRESSOR Take 1 tablet (25 mg total) by mouth 2 (two) times daily.   nitroGLYCERIN 0.4 MG SL tablet Commonly known as:  NITROSTAT Place 1 tablet (0.4 mg total) under the tongue every 5 (five) minutes as needed for chest pain.   ondansetron 4 MG tablet Commonly known as:  ZOFRAN Take 4 mg by mouth every 8 (eight) hours as needed for nausea or vomiting.   pantoprazole 40 MG tablet Commonly known as:  PROTONIX Take 1 tablet (40 mg total) by mouth daily.   promethazine 12.5 MG tablet Commonly known as:  PHENERGAN Take 12.5 mg by mouth every 6 (six) hours as needed for nausea or vomiting.   psyllium 95 % Pack Commonly known as:  HYDROCIL/METAMUCIL Take 1 packet by mouth daily.   ranitidine 150 MG tablet Commonly known as:  ZANTAC Take 1 tablet (150 mg total) by mouth at bedtime.   traZODone 50 MG tablet Commonly known as:  DESYREL Take 0.5-1 tablets (25-50 mg total) by mouth at bedtime as needed for sleep.        Allergies:  No Known Allergies    Outstanding Labs/Studies   N/A  Duration of Discharge Encounter   Greater than 30 minutes including physician time.  Signed, Nedra Hai Dunn PA-C 12/08/2016, 10:12 AM

## 2016-12-08 NOTE — Progress Notes (Signed)
Progress Note  Patient Name: [name] Date of Encounter: 12/08/2016  Primary Cardiologist: Dr. Meda Coffee (saw in consult in 2016)  Subjective   Continues to report intermittent chest pain, nonexertional, not positional today (but has been in the past), dull pain. He feels much reassured that relook cath looked OK. No SOB or edema. No sig change with Lasix.  Inpatient Medications    Scheduled Meds: . aspirin EC  81 mg Oral Daily  . atorvastatin  80 mg Oral QHS  . clopidogrel  75 mg Oral Q breakfast  . dicyclomine  20 mg Oral TID AC  . enoxaparin (LOVENOX) injection  40 mg Subcutaneous Q24H  . famotidine  20 mg Oral QHS  . furosemide  40 mg Oral Daily  . isosorbide mononitrate  30 mg Oral Daily  . losartan  100 mg Oral Daily  . metoprolol tartrate  25 mg Oral BID  . pantoprazole  40 mg Oral Daily  . sodium chloride flush  3 mL Intravenous Q12H   Continuous Infusions: . sodium chloride     PRN Meds: sodium chloride, acetaminophen, nitroGLYCERIN, ondansetron (ZOFRAN) IV, ondansetron, oxyCODONE, sodium chloride flush, traZODone   Vital Signs    Vitals:   12/07/16 2000 12/08/16 0105 12/08/16 0719 12/08/16 0840  BP:  104/66 109/79 138/87  Pulse: 70 (!) 59 60 62  Resp: 17 18    Temp:  (!) 97.5 F (36.4 C) 98.1 F (36.7 C)   TempSrc:  Oral Oral   SpO2: 100% 97% 100%   Weight:  231 lb 9.6 oz (105.1 kg)    Height:        Intake/Output Summary (Last 24 hours) at 12/08/16 0846 Last data filed at 12/07/16 1912  Gross per 24 hour  Intake              280 ml  Output              600 ml  Net             -320 ml   Filed Weights   12/07/16 1313 12/08/16 0105  Weight: 230 lb (104.3 kg) 231 lb 9.6 oz (105.1 kg)    Telemetry    NSR/SB upper 40s-60s - Personally Reviewed  Physical exam: GEN: No acute distress.  HEENT: Normocephalic, atraumatic, sclera non-icteric. Neck: No JVD or bruits. Cardiac: RRR no murmurs, rubs, or gallops.  Radials/DP/PT 1+ and equal  bilaterally.  Respiratory: Clear to auscultation bilaterally. Breathing is unlabored. GI: Soft, nontender, non-distended, BS +x 4. MS: no deformity. Right radial cath site without hematoma or ecchymosis; good pulse. Extremities: No clubbing or cyanosis. No edema. Distal pedal pulses are 2+ and equal bilaterally. Neuro:  AAOx3. Follows commands. Psych:  Responds to questions appropriately with a normal affect.  Labs    Chemistry Recent Labs Lab 12/06/16 0805 12/07/16 1402 12/08/16 0408  NA 138 139 138  K 4.0 4.4 4.1  CL 106 106 104  CO2 24 24 25   GLUCOSE 112* 99 87  BUN 20 19 19   CREATININE 1.08 1.20 1.25*  CALCIUM 8.9 9.1 8.9  PROT 6.6  --   --   ALBUMIN 4.2  --   --   AST 18  --   --   ALT 22  --   --   ALKPHOS 41  --   --   BILITOT 0.4  --   --   GFRNONAA  --  >60 >60  GFRAA  --  >60 >60  ANIONGAP  --  9 9     Hematology Recent Labs Lab 12/07/16 1402 12/08/16 0408  WBC 8.6 7.2  RBC 4.79 4.60  HGB 15.2 14.5  HCT 43.9 42.7  MCV 91.6 92.8  MCH 31.7 31.5  MCHC 34.6 34.0  RDW 13.1 13.6  PLT 178 174    Cardiac Enzymes Recent Labs Lab 12/07/16 1402  TROPONINI 0.07*   No results for input(s): TROPIPOC in the last 168 hours.   BNP Recent Labs Lab 12/07/16 1402  BNP 64.6      Radiology    Dg Chest 2 View  Result Date: 12/07/2016 CLINICAL DATA:  Left-sided chest pain for 1 week. EXAM: CHEST  2 VIEW COMPARISON:  November 26, 2016 FINDINGS: Mild atelectasis in the left base has increased in the interval. The heart, hila, mediastinum, lungs, and pleura are otherwise unremarkable. IMPRESSION: Mild increased atelectasis in the left base. No other acute abnormalities. Electronically Signed   By: Dorise Bullion III M.D   On: 12/07/2016 20:42    Cardiac Studies   Cath 12/07/16 Conclusion  Conclusions: 1. Mild to moderate, non-obstructive coronary artery disease similar to conclusion of prior catheterization on 11/27/16. 2. Widely patent LCx/OM  stent. 3. Mildly elevated left ventricular filling pressure with LVEF 45%.  Recommendations: 1. Continue DAPT with ASA and clopidogrel x 1 year. 2. Start isosorbide mononitrate (as patient was not taking this medication at home after recent hospital discharge). 3. Begin furosemide for possible component of heart failure. 4. If pain continues despite aforementioned interventions, consider empiric treatment for pericarditis, given recent MI.  Nelva Bush, MD [facility] Pager: [phone]     Patient Profile     56 y.o. male with CAD (NSTEMI s/p DES to Cx 11/2016), recently diagnosed chronic diastolic CHF, HTN, HLD, tobacco abuse, GERD, chronic nausea, BPH, cluster headache, h/o drug abuse, pain-med-seeking behavior, thyroid goiter, hypothyroidism, TIA, trigeminal neuralgia, asthma who presents for f/u NSTEMI.  Assessment & Plan    1. Recurrent chest pain with mixed atypical/typical features, CAD - cath unrevealing. He's had this intermittent discomfort ever since PCI. Question hyperawareness of chest pain given recent MI; also has h/o acid reflux but states this is dissimilar. Yesterday he did report a positional component worse with leaning forward. Question whether trial of empiric colchicine would be helpful in case this represents pericarditis. Continue present regimen otherwise.  2. Chronic diastolic CHF - LVEDP was 24 by original cath, yesterday was 20 with EF 45%. BNP was totally normal. He appears euvolemic on exam and did not see any difference whatsoever with Lasix. I will hold and d/w MD.  3. Essential HTN - generally controlled.  4. Hyperlipidemia - upon future follow-up would arrange f/u lipids/LFTs once acute issues are resolved.  Signed, [name], PA-C  12/08/2016, 8:46 AM    Agree with findings by Melina Copa PA-C  Status Post recent circumflex stenting in the setting of a non-STEMI approximately a week ago. He was admitted from the office with  recurrent symptoms. He had a Physician from yesterday revealing a widely patent stent without culprit lesions. His enzymes are trending down from his prior admission. His symptoms were somewhat atypical, potentially "Dressler's". His exam is benign. He'll be discharged home today on colchicine and will follow-up with a local provider in one week.   Lorretta Harp, M.D., [place], [facility], [place] [place] [address]. [place], [place]  [address]  [phone] 12/08/2016 9:31 AM

## 2016-12-08 NOTE — Telephone Encounter (Signed)
New message    Pt c/o medication issue:  1. Name of Medication: colchicine   2. How are you currently taking this medication (dosage and times per day)? 0.6 mg  3. Are you having a reaction (difficulty breathing--STAT)? No   4. What is your medication issue? Pt states this medication was called into his pharmacy, but his insurance does not cover it.

## 2016-12-08 NOTE — Telephone Encounter (Signed)
This is no alternative. Unfortunately a few years ago, the makers of the medication decided to run a "trial" on it to prove that it works, and as a result, got a patent on it which drove up the cost for some insurances. Would recommend he call around to other pharmacies as well as it may vary in cost in other centers. Please see if our patient assistance coordinator can help. The only other option I know if is the [facility] website has an assistance program he can call and apply for. Dayna Dunn PA-C

## 2016-12-08 NOTE — Telephone Encounter (Signed)
Received a message from the pt re: Colchicine.  I placed a call to [employer], and [facility] states that the pt's insurance would not pay for it at all, it was not on their formulary and it would cost pt over $200 if paid for it  Please advise!

## 2016-12-13 NOTE — Telephone Encounter (Signed)
Returned pts call and let him know that there was no alternative for Colchicine.  He was advised of the reason and to call around and get prices from different pharmacies.  Pt was advised to call us back and let us know if he was able to start it or not.  Pt verbalized understanding.

## 2016-12-13 NOTE — Telephone Encounter (Signed)
New message    Please call pt was calling about lab results

## 2016-12-15 ENCOUNTER — Encounter: Payer: Self-pay | Admitting: Physician Assistant

## 2016-12-15 ENCOUNTER — Telehealth: Payer: Self-pay

## 2016-12-15 NOTE — Progress Notes (Signed)
Cardiology Office Note    Date:  12/18/2016  ID:  [name], DOB 25-May-1960, MRN [id number] PCP:  [name], DO  Cardiologist:  Dr. Meda Coffee   Chief Complaint: f/u chest pain   History of Present Illness:  [name] is a 56 y.o. male with history of CAD (NSTEMI s/p DES to Cx 11/2016), recently diagnosed chronic combined CHF, HTN, HLD, tobacco abuse, GERD, chronic nausea, BPH, cluster headache, h/o drug abuse, thyroid goiter, hypothyroidism, TIA, trigeminal neuralgia, asthma who presents for f/u NSTEMI.  He was remotely cathed in 2010 with nonobstructive disease. He's been struggling with chronic nausea lately with pending endoscopy before recent admission. He was admitted 11/2016 with chest pain, dyspnea, nausea and troponin peak of >65. Cath showed total occlusion of mid Cx s/p PTCA/DES. There were irregularities are noted in the LAD and right coronary but no significant obstructive lesions seen. He did have frequent atypical pain post-cath which was managed medically, felt possibly musculoskeletal. There was suspicion of pain medication seeking behavior. His cath also suggested acute diastolic CHF with EF 01% and LVEDP 24, 2D echo 11/28/16 showed moderate LVH, EF 45-50%, hypokinesis of the basal-midlateral, inferolateral, and inferior myocardium, lipomatous hypertrophy, mildly dilated RV.On day of dc he was reported to have 3/10 discomfort but no pain with ambulation. He was discharged on ASA, Plavix, Imdur, metoprolol, and atorvastatin. He had TOC visit 12/06/16 with PCP which appeared unremarkable. However, when seen in follow-up he was reporting frequent recurrences of chest pain - both similar to prior angina but also with atypical features like with position changes or associated with lightning-like shocks. He was admitted for relook cath showing mild to moderate, non-obstructive coronary artery disease similar to conclusion of prior catheterization on 11/27/16, widely patent  LCx/OM stent, mildly elevated LV filling pressure 9mmHg with EF 45%. He was given a dose of IV Lasix without significant improvement in pain. Colchicine was added in case of possible component of inflammation but he was unable to afford this. CBC wnl. Cr post-cath was 1.25 which was similar to prior (1.1-1.2.).  He returns for follow-up today. He states for several days after discharge he felt better but ever since lifting a weed whacker on Saturday he has felt constant left sided chest pain that goes under his left breast and to his back. This is worse with palpation, lifting his left arm, laying on that side, bending over and laying on his stomach. This is similar to recent admission. His family urged him to go back to the hospital but he declined because he felt this felt like a pulled muscle. He does not necessarily notice any increase in exertion but has not tried to exert himself. No significant associated sx. He is not SOB at rest. No LEE or erythema. No recent long travel. He does not feel he can return to work if he continues to notice this.   Past Medical History:  Diagnosis Date  . BPH (benign prostatic hyperplasia)   . CAD (coronary artery disease)    a. NSTEMI troponin >65 with cath 11/2016 S/p PTCA & DES to mid circumflex; irregularies in LAD & RCA, LVEDP 24, EF 50% and 45-50% by echo.  . Chronic combined systolic and diastolic CHF (congestive heart failure) (HCC)    a. EF 45%.  . Cluster headache    hx of  . Complication of anesthesia    difficult waking up"  . Diverticulosis   . Drug abuse    hx of  .  Drug-seeking behavior   . GERD (gastroesophageal reflux disease)   . Hiatal hernia   . Hyperlipidemia   . Hypertension   . Hypothyroidism   . Nontoxic multinodular goiter   . NSTEMI (non-ST elevated myocardial infarction) ([place])   . Pulmonary nodule --- CT 10/19/2014: Nodule is stable, no further routine x-rays 01/06/2009  . TIA (transient ischemic attack)   . Trigeminal  neuralgia   . Tubular adenoma of colon 12/2015  . Unspecified asthma(493.90)     Past Surgical History:  Procedure Laterality Date  . APPENDECTOMY    . CARDIAC CATHETERIZATION N/A 11/25/2014   Procedure: Left Heart Cath and Coronary Angiography;  Surgeon: Belva Crome, MD;  Location: [facility];  Service: Cardiovascular;  Laterality: N/A;  . CARDIAC CATHETERIZATION  12/08/2008   Archie Endo 08/24/2010  . COLECTOMY  ~ 1976   "I had a blockage"  . CORONARY STENT INTERVENTION N/A 11/27/2016   Procedure: CORONARY STENT INTERVENTION;  Surgeon: Belva Crome, MD;  Location: [facility];  Service: Cardiovascular;  Laterality: N/A;  . CORONARY STENT PLACEMENT  11/27/2016   STENT XIENCE ALPINE RX 3.0X15 drug eluting stent was successfully placed  . CYST EXCISION Left 10/31/2016   Jaw  . DESTRUCTION TRIGEMINAL NERVE VIA NEUROLYTIC AGENT  x 3 , R side last ~2010  . KNEE ARTHROSCOPY Right 06/26/2000   Arthroscopy followed by open lateral release/notes 09/06/2010  . LEFT HEART CATH AND CORONARY ANGIOGRAPHY N/A 11/27/2016   Procedure: LEFT HEART CATH AND CORONARY ANGIOGRAPHY;  Surgeon: Belva Crome, MD;  Location: [facility];  Service: Cardiovascular;  Laterality: N/A;  . LEFT HEART CATH AND CORONARY ANGIOGRAPHY N/A 12/07/2016   Procedure: LEFT HEART CATH AND CORONARY ANGIOGRAPHY;  Surgeon: Nelva Bush, MD;  Location: [facility];  Service: Cardiovascular;  Laterality: N/A;  . 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 Ardine Bjork 09/06/2010  . SHOULDER HEMI-ARTHROPLASTY Right 05/26/2010   revision/notes 05/27/2010    Current Medications: Current Meds  Medication Sig  . acetaminophen (TYLENOL) 500 MG tablet Take 1,000 mg by mouth every 6 (six) hours as needed for mild pain or moderate pain.  Marland Kitchen aspirin EC 81 MG tablet Take 81 mg by mouth daily.  Marland Kitchen atorvastatin (LIPITOR) 80 MG tablet Take 1 tablet (80 mg total) by mouth at bedtime.  . chlorhexidine  (PERIDEX) 0.12 % solution Use as directed 10 mLs in the mouth or throat 2 (two) times daily. As directed.  . clopidogrel (PLAVIX) 75 MG tablet Take 1 tablet (75 mg total) by mouth daily with breakfast.  . dicyclomine (BENTYL) 20 MG tablet Take 1 tablet (20 mg total) by mouth 3 (three) times daily before meals.  . isosorbide mononitrate (IMDUR) 30 MG 24 hr tablet Take 1 tablet (30 mg total) by mouth daily.  Marland Kitchen losartan (COZAAR) 100 MG tablet Take 1 tablet (100 mg total) by mouth daily.  . metoprolol tartrate (LOPRESSOR) 25 MG tablet Take 1 tablet (25 mg total) by mouth 2 (two) times daily.  . nitroGLYCERIN (NITROSTAT) 0.4 MG SL tablet Place 1 tablet (0.4 mg total) under the tongue every 5 (five) minutes as needed for chest pain.  Marland Kitchen ondansetron (ZOFRAN) 4 MG tablet Take 4 mg by mouth every 8 (eight) hours as needed for nausea or vomiting.  . pantoprazole (PROTONIX) 40 MG tablet Take 1 tablet (40 mg total) by mouth daily.  . promethazine (PHENERGAN) 12.5 MG tablet Take 12.5 mg by mouth every 6 (six)  hours as needed for nausea or vomiting.  . psyllium (HYDROCIL/METAMUCIL) 95 % PACK Take 1 packet by mouth daily.  . ranitidine (ZANTAC) 150 MG tablet Take 1 tablet (150 mg total) by mouth at bedtime.  . traZODone (DESYREL) 50 MG tablet Take 0.5-1 tablets (25-50 mg total) by mouth at bedtime as needed for sleep.     Allergies:   Patient has no known allergies.   Social History   Social History  . Marital status: Married    Spouse name: N/A  . Number of children: 2  . 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 Smoking status: Former Smoker    Packs/day: 0.25    Years: 12.00    Types: Cigarettes    Quit date: 11/15/2016  . Smokeless tobacco: Never Used  . Alcohol use Yes     Comment: 12/07/2016 "maybe 3 mixed drinks/month"  . Drug use: No  . Sexual activity: Yes   Other Topics Concern  . None   Social History Narrative     HSG   Culinary school in [place]   Married - '84 - 5 years divorced; remarried '96   1 son - '85                  Family History:  Family History  Problem Relation Age of Onset  . Diabetes Mother   . Hyperlipidemia Mother   . Hypertension Mother   . Hypertension Father   . Heart disease Father        CAD/CHF  . Cancer Father        Mesothelioma  . Heart disease Maternal Aunt   . Prostate cancer Neg Hx   . Colon cancer Neg Hx     ROS:   Please see the history of present illness. All other systems are reviewed and otherwise negative.    PHYSICAL EXAM:   VS:  BP 122/70   Pulse 68   Ht 5\' 10"  (1.778 m)   Wt 235 lb (106.6 kg)   SpO2 96%   BMI 33.72 kg/m   BMI: Body mass index is 33.72 kg/m. GEN: Well nourished, well developed AAM, in no acute distress  HEENT: normocephalic, atraumatic Neck: no JVD, carotid bruits, or masses Cardiac: RRR; no murmurs, rubs, or gallops, no edema. Pt indicates focal tenderness to palpation to left chest wall. Respiratory:  clear to auscultation bilaterally, normal work of breathing GI: soft, nontender, nondistended, + BS MS: no deformity or atrophy  Skin: warm and dry, no rash. Right radial cath site without hematoma or ecchymosis; good pulse. Neuro:  Alert and Oriented x 3, Strength and sensation are intact, follows commands Psych: euthymic mood, full affect  Wt Readings from Last 3 Encounters:  12/18/16 235 lb (106.6 kg)  12/08/16 231 lb 9.6 oz (105.1 kg)  12/07/16 235 lb (106.6 kg)      Studies/Labs Reviewed:   EKG:  EKG was not ordered today.  Recent Labs: 10/09/2016: Magnesium 2.1 11/24/2016: TSH 0.96 12/06/2016: ALT 22 12/07/2016: B Natriuretic Peptide 64.6 12/08/2016: BUN 19; Creatinine, Ser 1.25; Hemoglobin 14.5; Platelets 174; Potassium 4.1; Sodium 138   Lipid Panel    Component Value Date/Time   CHOL 298 (H) 11/27/2016 0123   TRIG 98 11/27/2016 0123   HDL 79 11/27/2016 0123   CHOLHDL 3.8 11/27/2016 0123    VLDL 20 11/27/2016 0123   LDLCALC 199 (H) 11/27/2016 0123  LDLDIRECT 209.8 07/20/2009 0951    Additional studies/ records that were reviewed today include: Summarized above   ASSESSMENT & PLAN:   1. CAD - he continues to have atypical chest pain, reproducible with palpation and with specific movements. I reviewed with Dr. Burt Knack (DOD) in clinic today. EKG is benign. No high risk symptoms. He is not tachycardic, tachypneic or hypoxic. We feel his CP is likely MSK at this point. Unfortunately therapies are limited given his recent MI - cannot take NSAIDs. We have advised OTC tylenol as directed and f/u with PCP for further monitoring. Given his recent MI with troponin >65 we have advised him we are OK with him remaining out of work for 6-8 weeks. He states he's not quite sure he'll need that long but does seem to appreciate the time to recover and get into cardiac rehab first. He'd like to follow-up in 2-4 weeks to create a gameplan for returning to work.  2. Chronic combined CHF - appears euvolemic on exam. Doubt symptoms are related to this. Cr post-cath was 1.25 which was similar to prior values. 3. Essential HTN - controlled. Continue present regimen. 4. Hyperlipidemia - if doing well at f/u appointment would arrange reheck CMET/lipids at that time.  Disposition: F/u with Dr. Rudi Coco team APP in 2-4 weeks.  Medication Adjustments/Labs and Tests Ordered: Current medicines are reviewed at length with the patient today.  Concerns regarding medicines are outlined above. Medication changes, Labs and Tests ordered today are summarized above and listed in the Patient Instructions accessible in Encounters.   Signed, [name], PA-C  12/18/2016 11:19 AM    [facility] [place], [place], [place]  [address] Phone: [phone]; Fax: [phone]

## 2016-12-15 NOTE — Telephone Encounter (Signed)
I explained to the patient that the [place] procedure has been cancelled.  I advised him that I will call back next week when Dr. Fuller Plan can review and advise the next step.  Cardiology already advised him that the chest pain may be GI in origin, but he may not come off of Plavix for at least 6 months.  Dr. Fuller Plan please advise .  Patient notified that I will call him back next week when Dr. Fuller Plan returns to the office

## 2016-12-15 NOTE — Telephone Encounter (Signed)
Patient with recent admission on 11/26/16 for NSTEMI he had recath on 12/07/16 and was started on Plavix.  He is currently scheduled for EGD on 01/03/17 to evaluate his nausea and vomiting.  He no longer qualifies for care in the [facility] and I will cancel the EGD.  Dr. Fuller Plan would you like case to be rescheduled to the hospital on Plavix or cancel for now?  Left message for patient to call back

## 2016-12-18 ENCOUNTER — Ambulatory Visit (INDEPENDENT_AMBULATORY_CARE_PROVIDER_SITE_OTHER): Payer: BLUE CROSS/BLUE SHIELD | Admitting: Physician Assistant

## 2016-12-18 ENCOUNTER — Encounter: Payer: Self-pay | Admitting: Physician Assistant

## 2016-12-18 VITALS — BP 122/70 | HR 68 | Ht 70.0 in | Wt 235.0 lb

## 2016-12-18 DIAGNOSIS — I2510 Atherosclerotic heart disease of native coronary artery without angina pectoris: Secondary | ICD-10-CM | POA: Diagnosis not present

## 2016-12-18 DIAGNOSIS — R079 Chest pain, unspecified: Secondary | ICD-10-CM | POA: Diagnosis not present

## 2016-12-18 DIAGNOSIS — I5042 Chronic combined systolic (congestive) and diastolic (congestive) heart failure: Secondary | ICD-10-CM | POA: Diagnosis not present

## 2016-12-18 DIAGNOSIS — E785 Hyperlipidemia, unspecified: Secondary | ICD-10-CM | POA: Diagnosis not present

## 2016-12-18 DIAGNOSIS — I10 Essential (primary) hypertension: Secondary | ICD-10-CM | POA: Diagnosis not present

## 2016-12-18 NOTE — Patient Instructions (Addendum)
Medication Instructions:  Your physician recommends that you continue on your current medications as directed. Please refer to the Current Medication list given to you today.   Labwork: None ordered  Testing/Procedures: None ordered  Follow-Up: Your physician recommends that you schedule a follow-up appointment in: 2-4 [place] APP ON HER CARETEAM  Any Other Special Instructions Will Be Listed Below (If Applicable).    If you need a refill on your cardiac medications before your next appointment, please call your pharmacy.

## 2016-12-18 NOTE — Telephone Encounter (Signed)
Ladene Artist, MD sent to Marlon Pel, RN        Needs to wait 3 months from MI for EGD at [facility] with cardiac clearance for a diagnostic EGD. For stricture dilation he will need to be off Plavix which would be 6 months. Even at hospital he should have the same parameters.    Left message for patient to call back

## 2016-12-19 NOTE — Addendum Note (Signed)
Addended by: Gaetano Net on: 12/19/2016 12:47 PM   Modules accepted: Orders

## 2016-12-22 ENCOUNTER — Ambulatory Visit: Payer: BLUE CROSS/BLUE SHIELD | Admitting: Family Medicine

## 2017-01-03 ENCOUNTER — Encounter: Payer: BLUE CROSS/BLUE SHIELD | Admitting: Gastroenterology

## 2017-01-05 ENCOUNTER — Ambulatory Visit (HOSPITAL_BASED_OUTPATIENT_CLINIC_OR_DEPARTMENT_OTHER)
Admission: RE | Admit: 2017-01-05 | Discharge: 2017-01-05 | Disposition: A | Discharge status: Home / Self Care | Payer: BLUE CROSS/BLUE SHIELD | Source: Ambulatory Visit | Attending: Family Medicine | Admitting: Family Medicine

## 2017-01-05 ENCOUNTER — Ambulatory Visit (INDEPENDENT_AMBULATORY_CARE_PROVIDER_SITE_OTHER): Payer: BLUE CROSS/BLUE SHIELD | Admitting: Family Medicine

## 2017-01-05 ENCOUNTER — Ambulatory Visit: Payer: BLUE CROSS/BLUE SHIELD | Admitting: Family Medicine

## 2017-01-05 ENCOUNTER — Encounter: Payer: Self-pay | Admitting: Family Medicine

## 2017-01-05 VITALS — BP 108/80 | HR 74 | Temp 97.9°F | Ht 70.0 in | Wt 237.4 lb

## 2017-01-05 DIAGNOSIS — Z96611 Presence of right artificial shoulder joint: Secondary | ICD-10-CM | POA: Insufficient documentation

## 2017-01-05 DIAGNOSIS — Z9889 Other specified postprocedural states: Secondary | ICD-10-CM | POA: Insufficient documentation

## 2017-01-05 DIAGNOSIS — M25511 Pain in right shoulder: Secondary | ICD-10-CM

## 2017-01-05 DIAGNOSIS — J302 Other seasonal allergic rhinitis: Secondary | ICD-10-CM

## 2017-01-05 MED ORDER — CETIRIZINE HCL 10 MG PO TABS: 10.0000 mg | ORAL_TABLET | Freq: Every day | ORAL | 11 refills | Status: DC

## 2017-01-05 MED ORDER — METHYLPREDNISOLONE 4 MG PO TBPK: ORAL_TABLET | ORAL | 0 refills | Status: DC

## 2017-01-05 MED ORDER — FLUTICASONE PROPIONATE 50 MCG/ACT NA SUSP: 2.0000 | Freq: Every day | NASAL | 2 refills | Status: DC

## 2017-01-05 NOTE — Progress Notes (Signed)
Musculoskeletal Exam  Patient: [name] DOB: 07/18/1960  DOS: 01/05/2017  SUBJECTIVE:  Chief Complaint:   Chief Complaint  Patient presents with  . Shoulder Pain    Bernabe Dorce is a 56 y.o.  male for evaluation and treatment of R shoulder pain.   Onset:  3 months ago. Sudden, no injury or change in activity.  Location: Top of R shoulder Character:  sharp  Progression of issue:  is unchanged Associated symptoms: Mild weakness, decreased ROM Treatment: to date has been- Tylenol; can't take motrin anymore due to recent MI. He was given home stretches and exercises, but has not been doing them after having to go back to hospital for another heart cath.   Neurovascular symptoms: no  ROS: Musculoskeletal/Extremities: +R shoulder pain Neurologic: no numbness, tingling  Past Medical History:  Diagnosis Date  . BPH (benign prostatic hyperplasia)   . CAD (coronary artery disease)    a. NSTEMI troponin >65 with cath 11/2016 S/p PTCA & DES to mid circumflex; irregularies in LAD & RCA, LVEDP 24, EF 50% and 45-50% by echo.  . Chronic combined systolic and diastolic CHF (congestive heart failure) (HCC)    a. EF 45%.  . Cluster headache    hx of  . Complication of anesthesia    difficult waking up"  . Diverticulosis   . Drug abuse    hx of  . Drug-seeking behavior   . GERD (gastroesophageal reflux disease)   . Hiatal hernia   . Hyperlipidemia   . Hypertension   . Hypothyroidism   . Nontoxic multinodular goiter   . NSTEMI (non-ST elevated myocardial infarction) ([name])   . Pulmonary nodule --- CT 10/19/2014: Nodule is stable, no further routine x-rays 01/06/2009  . TIA (transient ischemic attack)   . Trigeminal neuralgia   . Tubular adenoma of colon 12/2015  . Unspecified asthma(493.90)    Past Surgical History:  Procedure Laterality Date  . APPENDECTOMY    . CARDIAC CATHETERIZATION N/A 11/25/2014   Procedure: Left Heart Cath and Coronary Angiography;  Surgeon: Belva Crome, MD;  Location: [facility];  Service: Cardiovascular;  Laterality: N/A;  . CARDIAC CATHETERIZATION  12/08/2008   Archie Endo 08/24/2010  . COLECTOMY  ~ 1976   "I had a blockage"  . CORONARY STENT INTERVENTION N/A 11/27/2016   Procedure: CORONARY STENT INTERVENTION;  Surgeon: Belva Crome, MD;  Location: [facility];  Service: Cardiovascular;  Laterality: N/A;  . CORONARY STENT PLACEMENT  11/27/2016   STENT XIENCE ALPINE RX 3.0X15 drug eluting stent was successfully placed  . CYST EXCISION Left 10/31/2016   Jaw  . DESTRUCTION TRIGEMINAL NERVE VIA NEUROLYTIC AGENT  x 3 , R side last ~2010  . KNEE ARTHROSCOPY Right 06/26/2000   Arthroscopy followed by open lateral release/notes 09/06/2010  . LEFT HEART CATH AND CORONARY ANGIOGRAPHY N/A 11/27/2016   Procedure: LEFT HEART CATH AND CORONARY ANGIOGRAPHY;  Surgeon: Belva Crome, MD;  Location: [facility];  Service: Cardiovascular;  Laterality: N/A;  . LEFT HEART CATH AND CORONARY ANGIOGRAPHY N/A 12/07/2016   Procedure: LEFT HEART CATH AND CORONARY ANGIOGRAPHY;  Surgeon: Nelva Bush, MD;  Location: [facility];  Service: Cardiovascular;  Laterality: N/A;  . 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 Ardine Bjork 09/06/2010  . SHOULDER HEMI-ARTHROPLASTY Right 05/26/2010   revision/notes 05/27/2010   Family History  Problem Relation Age of Onset  . Diabetes Mother   . Hyperlipidemia Mother   . Hypertension  Mother   . Hypertension Father   . Heart disease Father        CAD/CHF  . Cancer Father        Mesothelioma  . Heart disease Maternal Aunt   . Prostate cancer Neg Hx   . Colon cancer Neg Hx    Current Outpatient Prescriptions  Medication Sig Dispense Refill  . acetaminophen (TYLENOL) 500 MG tablet Take 1,000 mg by mouth every 6 (six) hours as needed for mild pain or moderate pain.    Marland Kitchen aspirin EC 81 MG tablet Take 81 mg by mouth daily.    Marland Kitchen atorvastatin (LIPITOR) 80 MG tablet Take 1 tablet  (80 mg total) by mouth at bedtime. 30 tablet 6  . chlorhexidine (PERIDEX) 0.12 % solution Use as directed 10 mLs in the mouth or throat 2 (two) times daily. As directed.    . clopidogrel (PLAVIX) 75 MG tablet Take 1 tablet (75 mg total) by mouth daily with breakfast. 30 tablet 11  . colchicine 0.6 MG tablet Take 1 tablet (0.6 mg total) by mouth 2 (two) times daily. 60 tablet 1  . dicyclomine (BENTYL) 20 MG tablet Take 1 tablet (20 mg total) by mouth 3 (three) times daily before meals. 60 tablet 1  . isosorbide mononitrate (IMDUR) 30 MG 24 hr tablet Take 1 tablet (30 mg total) by mouth daily. 30 tablet 6  . losartan (COZAAR) 100 MG tablet Take 1 tablet (100 mg total) by mouth daily. 90 tablet 1  . metoprolol tartrate (LOPRESSOR) 25 MG tablet Take 1 tablet (25 mg total) by mouth 2 (two) times daily. 60 tablet 6  . nitroGLYCERIN (NITROSTAT) 0.4 MG SL tablet Place 1 tablet (0.4 mg total) under the tongue every 5 (five) minutes as needed for chest pain. 50 tablet 1  . ondansetron (ZOFRAN) 4 MG tablet Take 4 mg by mouth every 8 (eight) hours as needed for nausea or vomiting.    . pantoprazole (PROTONIX) 40 MG tablet Take 1 tablet (40 mg total) by mouth daily. 30 tablet 6  . promethazine (PHENERGAN) 12.5 MG tablet Take 12.5 mg by mouth every 6 (six) hours as needed for nausea or vomiting.    . psyllium (HYDROCIL/METAMUCIL) 95 % PACK Take 1 packet by mouth daily.    . ranitidine (ZANTAC) 150 MG tablet Take 1 tablet (150 mg total) by mouth at bedtime. 30 tablet 5  . traZODone (DESYREL) 50 MG tablet Take 0.5-1 tablets (25-50 mg total) by mouth at bedtime as needed for sleep. 30 tablet 3   No Known Allergies Social History   Social History  . Marital status: Married   Occupational History  . Executive [employer]    Social History Main Topics  . Smoking status: Former Smoker    Packs/day: 0.25    Years: 12.00    Types: Cigarettes    Quit date: 11/15/2016  . Smokeless tobacco: Never Used  .  Alcohol use Yes     Comment: 12/07/2016 "maybe 3 mixed drinks/month"  . Drug use: No  . 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 No acute distress. Cardiovascular: Brisk cap refill Thorax & Lungs: No accessory muscle use Extremities: No clubbing. No cyanosis. No edema.  Skin: Warm. Dry. No erythema. No rash.  Musculoskeletal: R shoulder.   Normal active range of motion: yes.   Normal passive range of motion: yes Tenderness to palpation: no Deformity: no Ecchymosis: no Tests positive: Neer's, Emtpy Can, Speed's, Hawkins Tests negative: Cross over, [employer] over Obrien's, Obrien's, Lift off 5/5 strength throughout in UE's with exception of external rotation of R shoulder Neurologic: Normal sensory function. No focal deficits noted.  Psychiatric: Normal mood. Age appropriate judgment and insight. Alert & oriented x 3.    Assessment:  Right shoulder pain, unspecified chronicity - Plan: methylPREDNISolone (MEDROL DOSEPAK) 4 MG TBPK tablet, DG Shoulder Right  Seasonal allergic rhinitis, unspecified trigger  Plan: Orders as above. Tylenol. Heat. XR to r/o impingement. Home stretches for rotator cuff at home, more for biceps tendon given. Offered PT and injections, but he declined at this time. F/u in 4 weeks if no better.  The patient voiced understanding and agreement to the plan.   [place], DO 01/05/17  9:37 AM

## 2017-01-05 NOTE — Progress Notes (Signed)
Pre visit review using our clinic review tool, if applicable. No additional management support is needed unless otherwise documented below in the visit note. 

## 2017-01-05 NOTE — Patient Instructions (Signed)
Biceps Tendon Disruption (Proximal) Rehab Ask your health care provider which exercises are safe for you. Do exercises exactly as told by your health care provider and adjust them as directed. It is normal to feel mild stretching, pulling, tightness, or discomfort as you do these exercises, but you should stop right away if you feel sudden pain or your pain gets worse.Do not begin these exercises until told by your health care provider. Stretching and range of motion exercises These exercises warm up your muscles and joints and improve the movement and flexibility of your arm and shoulder. These exercises also help to relieve pain and stiffness. Exercise A: Shoulder flexion, standing   1. Stand facing a wall. Put your left / right hand on the wall. 2. Slide your left / right hand up the wall. Stop when you feel a stretch in your shoulder, or when you reach the angle recommended by your health care provider.  Use your other hand to help raise your arm, if needed.  As your hand gets higher, you may need to step closer to the wall.  Avoid shrugging your shoulder while you raise your arm. To do this, keep your shoulder blade tucked down toward your spine. 3. Hold for 15-20 seconds. 4. Slowly return to the starting position. Use your other arm to help, if needed. Repeat 2-3 times. Complete this exercise 1-2 times a day. Exercise B: Pendulum   1. Stand near a wall or a surface that you can hold onto for balance. 2. Bend at the waist and let your left / right arm hang straight down. Use your other arm to support you. 3. Relax your arm and shoulder muscles, and move your hips and your trunk so your left / right arm swings freely. Your arm should swing because of the motion of your body, not because you are using your arm or shoulder muscles. 4. Keep moving so your arm swings in the following directions, as told by your health care provider:  Side to side.  Forward and backward.  In clockwise and  counterclockwise circles. 5. Slowly return to the starting position. Repeat 2-3 times. Complete this exercise 1-2 times a day.  Strengthening exercises These exercises build strength and endurance in your arm and shoulder. Endurance is the ability to use your muscles for a long time, even after your muscles get tired. Exercise C: Elbow flexion, neutral  1. Sit on a stable chair without armrests, or stand. 2. Hold a 2-5 lb weight in your left / right hand, or hold an exercise band with both hands. Your palms should face each other at the starting position. 3. Bend your left / right elbow and move your hand up toward your shoulder.  Lead with your thumb, and keep your palm facing the same direction.  Keep your other arm straight down, in the starting position. 4. Slowly return to the starting position. Repeat 2-3 times. Complete this exercise 1 time every othre day. Exercise D: Forearm supination   1. Sit with your left / right forearm on a table. Your elbow should be below shoulder height. Rest your hand over the edge of the table so your palm faces down. 2. If directed, hold a hammer with your left / right hand. 3. Without moving your elbow, slowly rotate your hand so your palm faces up toward the ceiling.  If you are holding a hammer, begin by holding the hammer near the head. When this exercise gets easier for you, hold the  hammer farther down the handle. 4. Hold for 15-20 seconds. 5. Slowly return to the starting position. Repeat 2-3 times. Complete this exercise every other day. Exercise E: Scapular retraction   1. Sit in a stable chair without armrests, or stand. 2. Secure an exercise band to a stable object in front of you so the band is at shoulder height. 3. Hold one end of the exercise band in each hand. 4. Squeeze your shoulder blades together and move your elbows slightly behind you. Do not shrug your shoulders. 5. Hold for 15-20 seconds. 6. Slowly return to the starting  position. Repeat 2-3 times. Complete this exercise every other day. Exercise F: Scapular protraction, supine   1. Lie on your back on a firm surface. Hold a 2-5 lb weight in your left / right hand. 2. Raise your left / right arm straight into the air so your hand is directly above your shoulder joint. 3. Push the weight into the air so your shoulder lifts off of the surface that you are lying on. Do not move your head, neck, or back. 4. Hold for 15-20 seconds. 5. Slowly return to the starting position. Let your muscles relax completely before you repeat this exercise. Repeat 2-3 times. Complete this exercise every other day. This information is not intended to replace advice given to you by your health care provider. Make sure you discuss any questions you have with your health care provider. Document Released: 04/10/2005 Document Revised: 12/16/2015 Document Reviewed: 03/19/2015 Elsevier Interactive Patient Education  2017 [place].  Ice/cold pack over area for 10-15 min every 2-3 hours while awake.  Heat (pad or rice pillow in microwave) over affected area, 10-15 minutes every 2-3 hours while awake.   OK to take Tylenol 1000 mg (2 extra strength tabs) or 975 mg (3 regular strength tabs) every 6 hours as needed.

## 2017-01-08 ENCOUNTER — Telehealth: Payer: Self-pay | Admitting: Cardiology

## 2017-01-08 NOTE — Telephone Encounter (Signed)
New Message     They needs the orders signed and sent back today for physical rehab ,     Fax [phone]

## 2017-01-08 NOTE — Progress Notes (Signed)
Cardiology Office Note    Date:  01/09/2017   ID:  [name], DOB [birth year], MRN [id number]  PCP:  [name], DO  Cardiologist: Dr. Meda Coffee  Chief Complaint  Patient presents with  . Follow-up    2 week     History of Present Illness:  [name] is a 56 y.o. male history of CAD status post NSTEMI treated with DES to the circumflex 11/2016 with irregularity in the LAD and RCA but no obstructive lesions. His cath also suggested acute diastolic CHF EF 02% and LVEDP 24. Relook cardiac catheterization for chest pain 12/06/16 showed mild to moderate nonobstructive coronary disease consistent with cath 11/27/16 with widely patent circumflex/OM stent, mildly elevated LV filling pressure 20 mmHg and EF 45%. He was given a dose of IV Lasix without significant improvement in pain. Colchicine was added for possible inflammation but he was unable to afford it. 2-D echo 11/28/16 moderate LVH EF 45-50% with hypokinesis of the basal mid lateral, inferolateral and inferior myocardium. He had post MI chest pain that was felt to be atypical. chronic combined CHF, HTN, HLD, tobacco abuse, GERD, history of drug abuse, TIA  Patient saw Melina Copa, PA-C 12/18/16 and he continued to have atypical chest pain reproducible with palpation and specific movements. EKG was benign. He was advised to take over-the-counter Tylenol as directed. He was told he could stay out of work for 6-8 weeks but he is not sure he wanted to stay out that long.  Patient comes in today feeling well. He has been exercising but still has occasional chest pain. He says last night when he was walking after he ate dinner he had some pressure that eased with rest. He says it was different from his MI pain. Thinks he is having indigestion. He has gained 10 pounds on his scales, 5 pounds on ours since his last office visit. He is trying to watch his sodium carefully but is drinking a lot of Sprite and flavored waters. Start cardiac  rehabilitation today. Would like to go back to work tomorrow. Started on a steroid Dosepak today for sinus problems by primary care.     Past Medical History:  Diagnosis Date  . BPH (benign prostatic hyperplasia)   . CAD (coronary artery disease)    a. NSTEMI troponin >65 with cath 11/2016 S/p PTCA & DES to mid circumflex; irregularies in LAD & RCA, LVEDP 24, EF 50% and 45-50% by echo.  . Chronic combined systolic and diastolic CHF (congestive heart failure) (HCC)    a. EF 45%.  . Cluster headache    hx of  . Complication of anesthesia    difficult waking up"  . Diverticulosis   . Drug abuse    hx of  . Drug-seeking behavior   . GERD (gastroesophageal reflux disease)   . Hiatal hernia   . Hyperlipidemia   . Hypertension   . Hypothyroidism   . Nontoxic multinodular goiter   . NSTEMI (non-ST elevated myocardial infarction) ([facility])   . Pulmonary nodule --- CT 10/19/2014: Nodule is stable, no further routine x-rays 01/06/2009  . TIA (transient ischemic attack)   . Trigeminal neuralgia   . Tubular adenoma of colon 12/2015  . Unspecified asthma(493.90)     Past Surgical History:  Procedure Laterality Date  . APPENDECTOMY    . CARDIAC CATHETERIZATION N/A 11/25/2014   Procedure: Left Heart Cath and Coronary Angiography;  Surgeon: Belva Crome, MD;  Location: [facility];  Service: Cardiovascular;  Laterality: N/A;  . CARDIAC CATHETERIZATION  12/08/2008   Archie Endo 08/24/2010  . COLECTOMY  ~ 1976   "I had a blockage"  . CORONARY STENT INTERVENTION N/A 11/27/2016   Procedure: CORONARY STENT INTERVENTION;  Surgeon: Belva Crome, MD;  Location: [facility];  Service: Cardiovascular;  Laterality: N/A;  . CORONARY STENT PLACEMENT  11/27/2016   STENT XIENCE ALPINE RX 3.0X15 drug eluting stent was successfully placed  . CYST EXCISION Left 10/31/2016   Jaw  . DESTRUCTION TRIGEMINAL NERVE VIA NEUROLYTIC AGENT  x 3 , R side last ~2010  . KNEE ARTHROSCOPY Right 06/26/2000    Arthroscopy followed by open lateral release/notes 09/06/2010  . LEFT HEART CATH AND CORONARY ANGIOGRAPHY N/A 11/27/2016   Procedure: LEFT HEART CATH AND CORONARY ANGIOGRAPHY;  Surgeon: Belva Crome, MD;  Location: [facility];  Service: Cardiovascular;  Laterality: N/A;  . LEFT HEART CATH AND CORONARY ANGIOGRAPHY N/A 12/07/2016   Procedure: LEFT HEART CATH AND CORONARY ANGIOGRAPHY;  Surgeon: Nelva Bush, MD;  Location: [facility];  Service: Cardiovascular;  Laterality: N/A;  . 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 Ardine Bjork 09/06/2010  . SHOULDER HEMI-ARTHROPLASTY Right 05/26/2010   revision/notes 05/27/2010    Current Medications: Current Meds  Medication Sig  . acetaminophen (TYLENOL) 500 MG tablet Take 1,000 mg by mouth every 6 (six) hours as needed for mild pain or moderate pain.  Marland Kitchen aspirin EC 81 MG tablet Take 81 mg by mouth daily.  Marland Kitchen atorvastatin (LIPITOR) 80 MG tablet Take 1 tablet (80 mg total) by mouth at bedtime.  . chlorhexidine (PERIDEX) 0.12 % solution Use as directed 10 mLs in the mouth or throat 2 (two) times daily. As directed.  . clopidogrel (PLAVIX) 75 MG tablet Take 1 tablet (75 mg total) by mouth daily with breakfast.  . colchicine 0.6 MG tablet Take 1 tablet (0.6 mg total) by mouth 2 (two) times daily.  . fluticasone (FLONASE) 50 MCG/ACT nasal spray Place 2 sprays into both nostrils daily.  . isosorbide mononitrate (IMDUR) 30 MG 24 hr tablet Take 1 tablet (30 mg total) by mouth daily.  Marland Kitchen losartan (COZAAR) 100 MG tablet Take 1 tablet (100 mg total) by mouth daily.  . metoprolol tartrate (LOPRESSOR) 25 MG tablet Take 1 tablet (25 mg total) by mouth 2 (two) times daily.  . nitroGLYCERIN (NITROSTAT) 0.4 MG SL tablet Place 1 tablet (0.4 mg total) under the tongue every 5 (five) minutes as needed for chest pain.  Marland Kitchen ondansetron (ZOFRAN) 4 MG tablet Take 4 mg by mouth every 8 (eight) hours as needed for nausea or vomiting.  . pantoprazole  (PROTONIX) 40 MG tablet Take 1 tablet (40 mg total) by mouth daily.  . promethazine (PHENERGAN) 12.5 MG tablet Take 12.5 mg by mouth every 6 (six) hours as needed for nausea or vomiting.  . ranitidine (ZANTAC) 150 MG tablet Take 1 tablet (150 mg total) by mouth at bedtime.  . traZODone (DESYREL) 50 MG tablet Take 0.5-1 tablets (25-50 mg total) by mouth at bedtime as needed for sleep.     Allergies:   Patient has no known allergies.   Social History   Social History  . Marital status: Married    Spouse name: N/A  . Number of children: 2  . 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 Smoking status: Former Smoker    Packs/day:  0.25    Years: 12.00    Types: Cigarettes    Quit date: 11/15/2016  . Smokeless tobacco: Never Used  . Alcohol use Yes     Comment: 12/07/2016 "maybe 3 mixed drinks/month"  . Drug use: No  . Sexual activity: Yes   Other Topics Concern  . None   Social History Narrative   HSG   Culinary school in [place]   Married - '84 - 5 years divorced; remarried '96   1 son - '85                  Family History:  The patient's   family history includes Cancer in his father; Diabetes in his mother; Heart disease in his father and maternal aunt; Hyperlipidemia in his mother; Hypertension in his father and mother.   ROS:   Please see the history of present illness.    Review of Systems  Constitution: Positive for weight gain.  HENT: Positive for congestion.   Cardiovascular: Positive for chest pain and leg swelling.  Respiratory: Negative.   Endocrine: Negative.   Hematologic/Lymphatic: Negative.   Musculoskeletal: Negative.   Gastrointestinal: Positive for heartburn.  Genitourinary: Negative.   Neurological: Negative.    All other systems reviewed and are negative.   PHYSICAL EXAM:   VS:  BP 130/88   Pulse 72   Ht 5\' 10"  (1.778 m)   Wt 240 lb 1.9 oz (108.9 kg)   BMI 34.45 kg/m    Physical Exam  GEN: Obese, in no acute distress  Neck: Slight increase JVD, no carotid bruits, or masses Cardiac:RRR; positive S4 no murmurs, rubs   Respiratory:  clear to auscultation bilaterally, normal work of breathing GI: soft, nontender, nondistended, + BS Ext: Trace of ankle edema without cyanosis, clubbing , Good distal pulses bilaterally Neuro:  Alert and Oriented x 3 Psych: euthymic mood, full affect  Wt Readings from Last 3 Encounters:  01/09/17 240 lb 1.9 oz (108.9 kg)  01/05/17 237 lb 6 oz (107.7 kg)  12/18/16 235 lb (106.6 kg)      Studies/Labs Reviewed:   EKG:  EKG is not ordered today.    Recent Labs: 10/09/2016: Magnesium 2.1 11/24/2016: TSH 0.96 12/06/2016: ALT 22 12/07/2016: B Natriuretic Peptide 64.6 12/08/2016: BUN 19; Creatinine, Ser 1.25; Hemoglobin 14.5; Platelets 174; Potassium 4.1; Sodium 138   Lipid Panel    Component Value Date/Time   CHOL 298 (H) 11/27/2016 0123   TRIG 98 11/27/2016 0123   HDL 79 11/27/2016 0123   CHOLHDL 3.8 11/27/2016 0123   VLDL 20 11/27/2016 0123   LDLCALC 199 (H) 11/27/2016 0123   LDLDIRECT 209.8 07/20/2009 0951    Additional studies/ records that were reviewed today include:  2-D echo 8/7/18Study Conclusions   - Left ventricle: The cavity size was normal. There was moderate   concentric hypertrophy. Systolic function was mildly reduced. The   estimated ejection fraction was in the range of 45% to 50%. There   is hypokinesis of the basal-midlateral, inferolateral, and   inferior myocardium. - Right ventricle: The cavity size was mildly dilated. Wall   thickness was normal. - Right atrium: The atrium was mildly to moderately dilated. - Atrial septum: There was increased thickness of the septum,   consistent with lipomatous hypertrophy. - Pulmonic valve: There was trivial regurgitation. - Pulmonary arteries: Systolic pressure could not be accurately   estimated.     ------------------------------------------------------------------- Study data:  Comparison was made to the study of 10/19/2014.  Study status:  Routine.  Procedure:  Transthoracic echocardiography. Image quality was adequate.  Study completion:  There were no complications.          Transthoracic echocardiography.  M-mode, complete 2D, spectral Doppler, and color Doppler.  Birthdate: Patient birthdate: 10-13-1960.  Age:  Patient is 56 yr old.  Sex: Gender: male.    BMI: 32.6 kg/m^2.  Blood pressure:     114/70 Patient status:  Inpatient.  Study date:  Study date: 11/28/2016.  Cardiac catheterization 8/6/18Conclusion   Non-ST elevation myocardial infarction presentation with ongoing chest pain and rising troponin greater than 9.  Total occlusion of the mid circumflex with prolonged pain presentation due to the presence of collaterals.   Successful PCI of the mid circumflex total occlusion reducing the obstruction to less than 20% using a 3.0 Xience Alpine drug-eluting stent to restorie TIMI grade 3 flow. Final postdilatation balloon diameter was 3.5 Tice balloon.  Irregularities are noted in the LAD and right coronary but no significant obstructive lesions are seen.  Acute diastolic heart failure with LVEF 50%, and EDP 24 mmHg.   RECOMMENDATIONS:    Aspirin and Plavix for at least 6 months and preferably 12.  Aggressive risk factor modification.    Cardiac cath 8/16/18Conclusion  Conclusions: 1. Mild to moderate, non-obstructive coronary artery disease similar to conclusion of prior catheterization on 11/27/16. 2. Widely patent LCx/OM stent. 3. Mildly elevated left ventricular filling pressure with LVEF 45%.   Recommendations: 1. Continue DAPT with ASA and clopidogrel x 1 year. 2. Start isosorbide mononitrate (as patient was not taking this medication at home after recent hospital discharge). 3. Begin furosemide for possible component of heart failure. 4. If pain continues  despite aforementioned interventions, consider empiric treatment for pericarditis, given recent MI.   Nelva Bush, MD [facility] Pager: [phone]       ASSESSMENT:    1. Coronary artery disease involving native coronary artery of native heart without angina pectoris   2. Essential hypertension   3. Chronic diastolic CHF (congestive heart failure) ([facility])   4. Hypercholesteremia      PLAN:  In order of problems listed above:  CAD status post NSTEMI 11/27/16 treated with DES to the mid circumflex otherwise nonobstructive disease. Follow-up cath for recurrent chest pain 12/07/2016 mild to moderate nonobstructive CAD widely patent complex/OM stent LVEF 45% with mildly elevated left ventricular filling pressures.Patient continues to have mild chest pains that are unlike his MI pain. He starts cardiac rehabilitation today which I think will be good for him. Follow-up with Dr. Meda Coffee next month. 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  Essential hypertension blood pressure control  Chronic diastolic CHF EF 09%. Patient has a low fluid overload today and thinks he may of Her sodium in his diet. He is trying to watch carefully but is getting extra portions. 5 pound weight gain on our scales 10 pound on his scales. We'll give low-dose Lasix 20 mg daily for 3 days.  Hypercholesterolemia. Fasting lipids and LFTs tomorrow.  Medication Adjustments/Labs and Tests Ordered: Current medicines are reviewed at length with the patient today.  Concerns regarding medicines are outlined above.  Medication changes, Labs and Tests ordered today are listed in the Patient Instructions below. There are no Patient Instructions on file for this visit.   Sumner Boast, PA-C  01/09/2017 9:05 AM    [facility] [place], [place], [place]  [address] Phone: [phone];  Fax: [phone]

## 2017-01-08 NOTE — Telephone Encounter (Signed)
Rehab aware form is awaiting signature Dr Meda Coffee not in office today  .Adonis Housekeeper

## 2017-01-09 ENCOUNTER — Encounter: Payer: Self-pay | Admitting: *Deleted

## 2017-01-09 ENCOUNTER — Ambulatory Visit (INDEPENDENT_AMBULATORY_CARE_PROVIDER_SITE_OTHER): Payer: BLUE CROSS/BLUE SHIELD | Admitting: Physician Assistant

## 2017-01-09 ENCOUNTER — Telehealth: Payer: Self-pay | Admitting: Cardiology

## 2017-01-09 ENCOUNTER — Encounter: Payer: Self-pay | Admitting: Physician Assistant

## 2017-01-09 VITALS — BP 130/88 | HR 72 | Ht 70.0 in | Wt 240.1 lb

## 2017-01-09 DIAGNOSIS — I5032 Chronic diastolic (congestive) heart failure: Secondary | ICD-10-CM | POA: Diagnosis not present

## 2017-01-09 DIAGNOSIS — E7800 Pure hypercholesterolemia, unspecified: Secondary | ICD-10-CM

## 2017-01-09 DIAGNOSIS — I10 Essential (primary) hypertension: Secondary | ICD-10-CM

## 2017-01-09 DIAGNOSIS — I2510 Atherosclerotic heart disease of native coronary artery without angina pectoris: Secondary | ICD-10-CM

## 2017-01-09 MED ORDER — FUROSEMIDE 20 MG PO TABS: 20.0000 mg | ORAL_TABLET | Freq: Every day | ORAL | 0 refills | Status: DC

## 2017-01-09 NOTE — Telephone Encounter (Signed)
F/u message  Delana Meyer from [facility] call to f/u on cardiac rehab orders. Please callback to discuss

## 2017-01-09 NOTE — Patient Instructions (Signed)
Medication Instructions:  Your physician has recommended you make the following change in your medication:  1. Take Lasix (20 mg) daily for three days, sent in today to patient's requested pharmacy.   Labwork: Your physician recommends that you return for a FASTING lipid profile: tomorrow, Wednesday, Sept 19.    Testing/Procedures: -None  Follow-Up: Your physician recommends that you keep your scheduled follow-up appointment with Dr. Meda Coffee.   Any Other Special Instructions Will Be Listed Below (If Applicable).   A return to work note was given to pt today.   If you need a refill on your cardiac medications before your next appointment, please call your pharmacy.

## 2017-01-09 NOTE — Telephone Encounter (Signed)
Informed [name] at Cardiac Rehab that we will fax this referral over now.  Informed [name] that this is the first day Dr Meda Coffee has been in the office, and was able to fill the forms out.  Advised [name] to call back if she has any issues receiving faxed referral.  [name] verbalized understanding and agrees with this plan.

## 2017-01-10 ENCOUNTER — Other Ambulatory Visit: Payer: BLUE CROSS/BLUE SHIELD | Admitting: *Deleted

## 2017-01-10 DIAGNOSIS — E7800 Pure hypercholesterolemia, unspecified: Secondary | ICD-10-CM

## 2017-01-10 LAB — HEPATIC FUNCTION PANEL
ALT: 27 IU/L (ref 0–44)
AST: 27 IU/L (ref 0–40)
Albumin: 4.9 g/dL (ref 3.5–5.5)
Alkaline Phosphatase: 57 IU/L (ref 39–117)
Bilirubin Total: 0.5 mg/dL (ref 0.0–1.2)
Bilirubin, Direct: 0.15 mg/dL (ref 0.00–0.40)
Total Protein: 7.9 g/dL (ref 6.0–8.5)

## 2017-01-10 LAB — LIPID PANEL
Chol/HDL Ratio: 2.6 ratio (ref 0.0–5.0)
Cholesterol, Total: 248 mg/dL — ABNORMAL HIGH (ref 100–199)
HDL: 95 mg/dL (ref >39)
LDL Calculated: 144 mg/dL — ABNORMAL HIGH (ref 0–99)
Triglycerides: 44 mg/dL (ref 0–149)
VLDL Cholesterol Cal: 9 mg/dL (ref 5–40)

## 2017-01-15 ENCOUNTER — Telehealth: Payer: Self-pay | Admitting: *Deleted

## 2017-01-15 ENCOUNTER — Encounter: Payer: Self-pay | Admitting: *Deleted

## 2017-01-15 DIAGNOSIS — Z79899 Other long term (current) drug therapy: Secondary | ICD-10-CM

## 2017-01-15 NOTE — Telephone Encounter (Signed)
Pt requesting for his hours at work be decreased to 6 hours a day from his 10 hour a day schedule. Pt is working and going to cardiac rehab and it is too much on him right now. Ok per Ermalinda Barrios, PA pt to do 6 hours a day for the next 4 weeks. If pt feels he needs to be at the 6 hour intervals longer he is aware to call and let Ermalinda Barrios, PA know. Pt thanked me for our help in this matter. Work note placed a the front desk for pt pick up.

## 2017-01-15 NOTE — Telephone Encounter (Signed)
It's fine to give him a note to work 6 hrs/day. Let me know if you need me to sign it. I'm here today.

## 2017-01-15 NOTE — Telephone Encounter (Signed)
Called and made pt aware of his lab results. He asked me to check with Ermalinda Barrios, PA-C, re: him returning back to work.  We had Dayna Dunn, PA-C sign off on it due to Selinda Eon being out of the office, but pt states that 10 hours are a little too much for him right now, and would like to have a note reducing him back to 6? I advised the pt that I would have to check with Ermalinda Barrios, PA-C, and get her recommendations and someone will call him back. Pt verbalized understanding.

## 2017-01-15 NOTE — Telephone Encounter (Signed)
Pt has been made aware that has been ok'd by Selinda Eon for him to return to work only 6 hours a day. Will get note ready and Selinda Eon will have to sign it and someone will call him when it is ready to be picked up. Pt thanked me for my call.

## 2017-01-15 NOTE — Telephone Encounter (Signed)
[name],  This patient is requesting a letter stating he can return to work for 6 hrs daily. Can you help me with this? Thanks, Selinda Eon

## 2017-01-15 NOTE — Telephone Encounter (Signed)
-----   Message from Imogene Burn, PA-C sent at 01/15/2017  8:05 AM EDT ----- Continue lipitor and repeat fasting lipids and lft's in 4 months

## 2017-01-22 ENCOUNTER — Encounter (HOSPITAL_BASED_OUTPATIENT_CLINIC_OR_DEPARTMENT_OTHER): Payer: Self-pay | Admitting: Emergency Medicine

## 2017-01-22 ENCOUNTER — Emergency Department (HOSPITAL_BASED_OUTPATIENT_CLINIC_OR_DEPARTMENT_OTHER)
Admission: EM | Admit: 2017-01-22 | Discharge: 2017-01-22 | Disposition: A | Discharge status: Home / Self Care | Payer: BLUE CROSS/BLUE SHIELD | Attending: Emergency Medicine | Admitting: Emergency Medicine

## 2017-01-22 ENCOUNTER — Telehealth: Payer: Self-pay | Admitting: Family Medicine

## 2017-01-22 DIAGNOSIS — Z8673 Personal history of transient ischemic attack (TIA), and cerebral infarction without residual deficits: Secondary | ICD-10-CM | POA: Diagnosis not present

## 2017-01-22 DIAGNOSIS — R040 Epistaxis: Secondary | ICD-10-CM | POA: Diagnosis present

## 2017-01-22 DIAGNOSIS — R195 Other fecal abnormalities: Secondary | ICD-10-CM | POA: Diagnosis not present

## 2017-01-22 DIAGNOSIS — I110 Hypertensive heart disease with heart failure: Secondary | ICD-10-CM | POA: Insufficient documentation

## 2017-01-22 DIAGNOSIS — J45909 Unspecified asthma, uncomplicated: Secondary | ICD-10-CM | POA: Diagnosis not present

## 2017-01-22 DIAGNOSIS — Z87891 Personal history of nicotine dependence: Secondary | ICD-10-CM | POA: Diagnosis not present

## 2017-01-22 DIAGNOSIS — I5042 Chronic combined systolic (congestive) and diastolic (congestive) heart failure: Secondary | ICD-10-CM | POA: Insufficient documentation

## 2017-01-22 DIAGNOSIS — I2510 Atherosclerotic heart disease of native coronary artery without angina pectoris: Secondary | ICD-10-CM | POA: Diagnosis not present

## 2017-01-22 DIAGNOSIS — E039 Hypothyroidism, unspecified: Secondary | ICD-10-CM | POA: Diagnosis not present

## 2017-01-22 DIAGNOSIS — K921 Melena: Secondary | ICD-10-CM

## 2017-01-22 LAB — COMPREHENSIVE METABOLIC PANEL
ALT: 25 U/L (ref 17–63)
AST: 27 U/L (ref 15–41)
Albumin: 4.2 g/dL (ref 3.5–5.0)
Alkaline Phosphatase: 45 U/L (ref 38–126)
Anion gap: 9 (ref 5–15)
BUN: 23 mg/dL — ABNORMAL HIGH (ref 6–20)
CO2: 24 mmol/L (ref 22–32)
Calcium: 9.2 mg/dL (ref 8.9–10.3)
Chloride: 104 mmol/L (ref 101–111)
Creatinine, Ser: 1.05 mg/dL (ref 0.61–1.24)
GFR calc Af Amer: >60 mL/min (ref >60)
GFR calc non Af Amer: >60 mL/min (ref >60)
Glucose, Bld: 90 mg/dL (ref 65–99)
Potassium: 3.9 mmol/L (ref 3.5–5.1)
Sodium: 137 mmol/L (ref 135–145)
Total Bilirubin: 0.4 mg/dL (ref 0.3–1.2)
Total Protein: 7.9 g/dL (ref 6.5–8.1)

## 2017-01-22 LAB — CBC WITH DIFFERENTIAL/PLATELET
Basophils Absolute: 0.1 10*3/uL (ref 0.0–0.1)
Basophils Relative: 1 %
Eosinophils Absolute: 0.1 10*3/uL (ref 0.0–0.7)
Eosinophils Relative: 1 %
HCT: 44.7 % (ref 39.0–52.0)
Hemoglobin: 15.6 g/dL (ref 13.0–17.0)
Lymphocytes Relative: 42 %
Lymphs Abs: 3.3 10*3/uL (ref 0.7–4.0)
MCH: 32 pg (ref 26.0–34.0)
MCHC: 34.9 g/dL (ref 30.0–36.0)
MCV: 91.6 fL (ref 78.0–100.0)
Monocytes Absolute: 0.7 10*3/uL (ref 0.1–1.0)
Monocytes Relative: 9 %
Neutro Abs: 3.6 10*3/uL (ref 1.7–7.7)
Neutrophils Relative %: 47 %
Platelets: 150 10*3/uL (ref 150–400)
RBC: 4.88 MIL/uL (ref 4.22–5.81)
RDW: 12.8 % (ref 11.5–15.5)
WBC: 7.8 10*3/uL (ref 4.0–10.5)

## 2017-01-22 LAB — OCCULT BLOOD X 1 CARD TO LAB, STOOL: Fecal Occult Bld: NEGATIVE

## 2017-01-22 NOTE — ED Notes (Signed)
Pt verbalizes understanding of d/c instructions and denies any further needs at this time. 

## 2017-01-22 NOTE — ED Provider Notes (Signed)
Emergency Department Provider Note   I have reviewed the triage vital signs and the nursing notes.   HISTORY  Chief Complaint Epistaxis   HPI [name] is a 56 y.o. male with PMH of CAD, HLD, HTN, and prior TIA presents emergency permit for evaluation of intermittent nosebleeds and intermittent bright red blood per rectum. Patient denies any pain with rectal bleeding. No abdominal discomfort. Describes the blood is bright red. Denies any black, sticky stools. He also has had intermittent nosebleeds from the right nostril over the past month. He recently had an acute myocardial infarction was started on aspirin and Plavix. His last colonoscopy was last year and reportedly normal. He reports waking in the morning with dried blood in his nose. He blows out the clot has mild bleeding afterwards which resolved spontaneously. He has not followed with ENT.    Past Medical History:  Diagnosis Date  . BPH (benign prostatic hyperplasia)   . CAD (coronary artery disease)    a. NSTEMI troponin >65 with cath 11/2016 S/p PTCA & DES to mid circumflex; irregularies in LAD & RCA, LVEDP 24, EF 50% and 45-50% by echo.  . Chronic combined systolic and diastolic CHF (congestive heart failure) (HCC)    a. EF 45%.  . Cluster headache    hx of  . Complication of anesthesia    difficult waking up"  . Diverticulosis   . Drug abuse ([place])    hx of  . Drug-seeking behavior   . GERD (gastroesophageal reflux disease)   . Hiatal hernia   . Hyperlipidemia   . Hypertension   . Hypothyroidism   . Nontoxic multinodular goiter   . NSTEMI (non-ST elevated myocardial infarction) ([place])   . Pulmonary nodule --- CT 10/19/2014: Nodule is stable, no further routine x-rays 01/06/2009  . TIA (transient ischemic attack)   . Trigeminal neuralgia   . Tubular adenoma of colon 12/2015  . Unspecified asthma(493.90)     Patient Active Problem List   Diagnosis Date Noted  . Obesity 12/08/2016  . Chronic diastolic  CHF (congestive heart failure) ([place]) 12/08/2016  . Atypical chest pain 12/07/2016  . NSTEMI (non-ST elevated myocardial infarction) ([place]) 11/26/2016  . Intractable nausea and vomiting 10/09/2016  . Nausea vomiting and diarrhea 10/09/2016  . Enteritis due to Clostridium difficile 10/30/2015  . Malnutrition of moderate degree 10/21/2015  . Acute kidney injury ([name]) 10/20/2015  . PCP NOTES >>>>>>>>>>>>>>>>> 09/30/2015  . Hypercholesteremia 09/22/2015  . Renal mass, left 11/02/2014  . CAD (coronary artery disease)   . Drug abuse and dependence ([place]) 09/18/2014  . Visit for preventive health examination 02/04/2014  . Screening for ischemic heart disease 02/04/2014  . BPH associated with nocturia 12/24/2013  . Hypothyroidism 12/24/2013  . Chronic ethmoidal sinusitis 12/24/2013  . Low back pain radiating to right leg 05/10/2012  . Thrombocytopenia ([place]) 12/05/2011  . Tobacco use 11/30/2011  . Anxiety 12/26/2010  . Transient cerebral ischemia 01/07/2010  . Asthma 01/19/2009  . Pulmonary nodule --- CT 10/19/2014: Nodule is stable, no further routine x-rays 01/06/2009  . Nontoxic multinodular goiter 12/31/2008  . DYSPNEA ON EXERTION 12/15/2008  . Trigeminal neuralgia 07/24/2007  . Dyslipidemia 12/27/2006  . Cluster headache 12/27/2006  . Essential hypertension 12/27/2006  . GERD 12/27/2006    Past Surgical History:  Procedure Laterality Date  . APPENDECTOMY    . CARDIAC CATHETERIZATION N/A 11/25/2014   Procedure: Left Heart Cath and Coronary Angiography;  Surgeon: Belva Crome, MD;  Location: [facility];  Service: Cardiovascular;  Laterality: N/A;  . CARDIAC CATHETERIZATION  12/08/2008   Archie Endo 08/24/2010  . COLECTOMY  ~ 1976   "I had a blockage"  . CORONARY STENT INTERVENTION N/A 11/27/2016   Procedure: CORONARY STENT INTERVENTION;  Surgeon: Belva Crome, MD;  Location: [facility];  Service: Cardiovascular;  Laterality: N/A;  . CORONARY STENT PLACEMENT  11/27/2016    STENT XIENCE ALPINE RX 3.0X15 drug eluting stent was successfully placed  . CYST EXCISION Left 10/31/2016   Jaw  . DESTRUCTION TRIGEMINAL NERVE VIA NEUROLYTIC AGENT  x 3 , R side last ~2010  . KNEE ARTHROSCOPY Right 06/26/2000   Arthroscopy followed by open lateral release/notes 09/06/2010  . LEFT HEART CATH AND CORONARY ANGIOGRAPHY N/A 11/27/2016   Procedure: LEFT HEART CATH AND CORONARY ANGIOGRAPHY;  Surgeon: Belva Crome, MD;  Location: [facility];  Service: Cardiovascular;  Laterality: N/A;  . LEFT HEART CATH AND CORONARY ANGIOGRAPHY N/A 12/07/2016   Procedure: LEFT HEART CATH AND CORONARY ANGIOGRAPHY;  Surgeon: Nelva Bush, MD;  Location: [facility];  Service: Cardiovascular;  Laterality: N/A;  . 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 Ardine Bjork 09/06/2010  . SHOULDER HEMI-ARTHROPLASTY Right 05/26/2010   revision/notes 05/27/2010    Current Outpatient Rx  . Order #: [id number] Class: Historical Med  . Order #: [id number] Class: Historical Med  . Order #: [id number] Class: Normal  . Order #: [id number] Class: Historical Med  . Order #: [id number] Class: Normal  . Order #: [id number] Class: Normal  . Order #: [id number] Class: Normal  . Order #: [id number] Class: Normal  . Order #: [id number] Class: Normal  . Order #: [id number] Class: Normal  . Order #: [id number] Class: Normal  . Order #: [id number] Class: Normal  . Order #: [id number] Class: Historical Med  . Order #: [id number] Class: Normal  . Order #: [id number] Class: Historical Med  . Order #: [id number] Class: Normal  . Order #: [id number] Class: Normal    Allergies Patient has no known allergies.  Family History  Problem Relation Age of Onset  . Diabetes Mother   . Hyperlipidemia Mother   . Hypertension Mother   . Hypertension Father   . Heart disease Father        CAD/CHF  . Cancer Father        Mesothelioma  . Heart disease Maternal Aunt   . Prostate cancer Neg Hx   . Colon cancer Neg Hx      Social History Social History  Substance Use Topics  . Smoking status: Former Smoker    Packs/day: 0.25    Years: 12.00    Types: Cigarettes    Quit date: 11/15/2016  . Smokeless tobacco: Never Used  . Alcohol use Yes     Comment: 12/07/2016 "maybe 3 mixed drinks/month"    Review of Systems  Constitutional: No fever/chills Eyes: No visual changes. ENT: No sore throat. Positive epistaxis.  Cardiovascular: Denies chest pain. Respiratory: Denies shortness of breath. Gastrointestinal: No abdominal pain.  No nausea, no vomiting.  No diarrhea.  No constipation. Genitourinary: Negative for dysuria. Positive stool.  Musculoskeletal: Negative for back pain. Skin: Negative for rash. Neurological: Negative for headaches, focal weakness or numbness.  10-point ROS otherwise negative.  ____________________________________________   PHYSICAL EXAM:  VITAL SIGNS: ED Triage Vitals  Enc Vitals Group     BP 01/22/17 1945 (!) 155/94     Pulse Rate 01/22/17 1945 92     Resp 01/22/17 1945 18     Temp 01/22/17 1945 98.5 F (36.9  C)     Temp Source 01/22/17 1945 Oral     SpO2 01/22/17 1945 100 %     Weight 01/22/17 1942 240 lb (108.9 kg)     Height 01/22/17 1942 5\' 10"  (1.778 m)    Constitutional: Alert and oriented. Well appearing and in no acute distress. Eyes: Conjunctivae are normal. Head: Atraumatic. Nose: No congestion/rhinnorhea. No scabbing or areas of oozing blood.  Mouth/Throat: Mucous membranes are moist.  Neck: No stridor.   Cardiovascular: Normal rate, regular rhythm. Good peripheral circulation. Grossly normal heart sounds.   Respiratory: Normal respiratory effort.  No retractions. Lungs CTAB. Gastrointestinal: Soft and nontender. No distention. Rectal exam normal with no BRB or melena.  Musculoskeletal: No lower extremity tenderness nor edema. No gross deformities of extremities. Neurologic:  Normal speech and language. No gross focal neurologic deficits are  appreciated.  Skin:  Skin is warm, dry and intact. No rash noted.   ____________________________________________   LABS (all labs ordered are listed, but only abnormal results are displayed)  Labs Reviewed  COMPREHENSIVE METABOLIC PANEL - Abnormal; Notable for the following:       Result Value   BUN 23 (*)    All other components within normal limits  CBC WITH DIFFERENTIAL/PLATELET  OCCULT BLOOD X 1 CARD TO LAB, STOOL    ____________________________________________   PROCEDURES  Procedure(s) performed:   Procedures  None ____________________________________________   INITIAL IMPRESSION / ASSESSMENT AND PLAN / ED COURSE  Pertinent labs & imaging results that were available during my care of the patient were reviewed by me and considered in my medical decision making (see chart for details).  Patient presents emergency department for evaluation of intermittent nosebleeds and bright red blood in the stools. Could be that blood from the nose is entering the GI tract and becoming visible in the stool but would not expect this to be bright red. On exam he has no fissures or visible hemorrhoids. No fever or chills. Nasal mucosa without bleeding or areas of recent clot or oozing. Plan for outpatient ENT follow up. Sent hemoccult and labs. Will reassess.   Labs and hemoccult negative. Plan for PCP and ENT follow up.   At this time, I do not feel there is any life-threatening condition present. I have reviewed and discussed all results (EKG, imaging, lab, urine as appropriate), exam findings with patient. I have reviewed nursing notes and appropriate previous records.  I feel the patient is safe to be discharged home without further emergent workup. Discussed usual and customary return precautions. Patient and family (if present) verbalize understanding and are comfortable with this plan.  Patient will follow-up with their primary care provider. If they do not have a primary care  provider, information for follow-up has been provided to them. All questions have been answered.  ____________________________________________  FINAL CLINICAL IMPRESSION(S) / ED DIAGNOSES  Final diagnoses:  Epistaxis  Blood in stool     MEDICATIONS GIVEN DURING THIS VISIT:  Medications - No data to display   NEW OUTPATIENT MEDICATIONS STARTED DURING THIS VISIT:  None   Note:  This document was prepared using Dragon voice recognition software and may include unintentional dictation errors.  Nanda Quinton, MD Emergency Medicine   Calvin Jablonowski, [facility], MD 01/23/17 [phone]

## 2017-01-22 NOTE — ED Triage Notes (Signed)
Patient states that he has had intermittent nose bleeds x 1 month. The patient reports that he also has some noted blood in his stools intermittently

## 2017-01-22 NOTE — ED Notes (Signed)
ED Provider at bedside. 

## 2017-01-22 NOTE — Telephone Encounter (Signed)
Patient Name: [name]  DOB: 1960/08/18    Initial Comment caller has blood in his stool and also has a nose bleed every morning    Nurse Assessment  Nurse: Genoveva Ill, RN, Lattie Haw Date/Time (Eastern Time): 01/22/2017 3:21:43 PM  Confirm and document reason for call. If symptomatic, describe symptoms. ---caller states has had rectal bleeding intermittently x 1 mth; also has coagulated blood in nostrils, blows it ,then bleeds briefly for about 1 mth; had MI in August; on Plavix, ASA  Does the patient have any new or worsening symptoms? ---Yes  Will a triage be completed? ---Yes  Related visit to physician within the last 2 weeks? ---No  Does the PT have any chronic conditions? (i.e. diabetes, asthma, etc.) ---Yes  List chronic conditions. ---MI August, HTN, acid reflux, trigeminal nerve block d/t cluster h/a's  Is this a behavioral health or substance abuse call? ---No     Guidelines    Guideline Title Affirmed Question Affirmed Notes  Nosebleed Taking Coumadin (warfarin) or other strong blood thinner, or known bleeding disorder (e.g., thrombocytopenia)   Rectal Bleeding [1] MODERATE rectal bleeding (small blood clots, passing blood without stool, or toilet water turns red) AND [2] more than once a day    Final Disposition User   Go to ED Now [place], RN, [employer]    Referrals  Designer, multimedia - ED   Caller Disagree/Comply Comply  Caller Understands Yes  PreDisposition Call Doctor

## 2017-01-22 NOTE — Discharge Instructions (Signed)

## 2017-01-25 ENCOUNTER — Encounter: Payer: Self-pay | Admitting: *Deleted

## 2017-02-02 ENCOUNTER — Ambulatory Visit (INDEPENDENT_AMBULATORY_CARE_PROVIDER_SITE_OTHER): Payer: BLUE CROSS/BLUE SHIELD | Admitting: Family Medicine

## 2017-02-02 ENCOUNTER — Encounter: Payer: Self-pay | Admitting: Family Medicine

## 2017-02-02 VITALS — BP 120/82 | HR 62 | Temp 97.9°F | Ht 70.0 in | Wt 238.5 lb

## 2017-02-02 DIAGNOSIS — R040 Epistaxis: Secondary | ICD-10-CM | POA: Diagnosis not present

## 2017-02-02 DIAGNOSIS — J3489 Other specified disorders of nose and nasal sinuses: Secondary | ICD-10-CM | POA: Diagnosis not present

## 2017-02-02 NOTE — Patient Instructions (Signed)
Try using Vaseline and/or triple antibiotic ointment on the inside of your R nostril (middle side against the septum/nose divider) twice daily.  Keep the environment moist (consider an air humidifier if not).  If you do not hear anything about your referral in the next several days, call our office and ask for an update.

## 2017-02-02 NOTE — Progress Notes (Signed)
Pre visit review using our clinic review tool, if applicable. No additional management support is needed unless otherwise documented below in the visit note. 

## 2017-02-02 NOTE — Progress Notes (Signed)
Chief Complaint  Patient presents with  . Follow-up    right shoulder pain    Subjective: Patient is a 56 y.o. male here for nosebleeds.  For around 3.5 weeks, the pt has been experiencing R sided nosebleeds. Slow ooze during day, will ruin his pillows and cases at night. He denies chronic use of his nasal spray or picking his nose. He was recently placed on Plavix and aspirin due to an NSTEMI. He does not have an air humidifier and has not been using anything in his nose. Denies any other forms of trauma. He was seen in the emergency department and told he may need to see an ear, nose and throat doctor but wanted to come here first.   ROS: HEENT: As noted in HPI  Family History  Problem Relation Age of Onset  . Diabetes Mother   . Hyperlipidemia Mother   . Hypertension Mother   . Hypertension Father   . Heart disease Father   . Cancer Father   . Sudden death Father        OCT 2009/08/10  . CAD Father   . Heart failure Father   . Mesothelioma Father   . Heart disease Maternal Aunt   . Prostate cancer Neg Hx   . Colon cancer Neg Hx    Past Medical History:  Diagnosis Date  . BPH (benign prostatic hyperplasia)   . CAD (coronary artery disease)    a. NSTEMI troponin >65 with cath 11/2016 S/p PTCA & DES to mid circumflex; irregularies in LAD & RCA, LVEDP 24, EF 50% and 45-50% by echo.  . Chronic combined systolic and diastolic CHF (congestive heart failure) (HCC)    a. EF 45%.  . Cluster headache    hx of  . Complication of anesthesia    difficult waking up"  . Diverticulosis   . Drug abuse ([place])    hx of  . GERD (gastroesophageal reflux disease)   . Hiatal hernia   . Hyperlipidemia   . Hypertension   . Hypothyroidism   . Nontoxic multinodular goiter   . NSTEMI (non-ST elevated myocardial infarction) ([place])   . Perforated nasal septum   . Pulmonary nodule --- CT 10/19/2014: Nodule is stable, no further routine x-rays 01/06/2009  . TIA (transient ischemic attack)   .  Trigeminal neuralgia   . Tubular adenoma of colon 12/2015  . Unspecified asthma(493.90)    No Known Allergies  Current Outpatient Prescriptions:  .  acetaminophen (TYLENOL) 500 MG tablet, Take 1,000 mg by mouth every 6 (six) hours as needed for mild pain or moderate pain., Disp: , Rfl:  .  aspirin EC 81 MG tablet, Take 81 mg by mouth daily., Disp: , Rfl:  .  atorvastatin (LIPITOR) 80 MG tablet, Take 1 tablet (80 mg total) by mouth at bedtime., Disp: 30 tablet, Rfl: 6 .  chlorhexidine (PERIDEX) 0.12 % solution, Use as directed 10 mLs in the mouth or throat 2 (two) times daily. As directed., Disp: , Rfl:  .  clopidogrel (PLAVIX) 75 MG tablet, Take 1 tablet (75 mg total) by mouth daily with breakfast., Disp: 30 tablet, Rfl: 11 .  colchicine 0.6 MG tablet, Take 1 tablet (0.6 mg total) by mouth 2 (two) times daily., Disp: 60 tablet, Rfl: 1 .  fluticasone (FLONASE) 50 MCG/ACT nasal spray, Place 2 sprays into both nostrils daily., Disp: 16 g, Rfl: 2 .  furosemide (LASIX) 20 MG tablet, Take 1 tablet (20 mg total) by mouth daily., Disp: 30  tablet, Rfl: 0 .  isosorbide mononitrate (IMDUR) 30 MG 24 hr tablet, Take 1 tablet (30 mg total) by mouth daily., Disp: 30 tablet, Rfl: 6 .  losartan (COZAAR) 100 MG tablet, Take 1 tablet (100 mg total) by mouth daily., Disp: 90 tablet, Rfl: 1 .  metoprolol tartrate (LOPRESSOR) 25 MG tablet, Take 1 tablet (25 mg total) by mouth 2 (two) times daily., Disp: 60 tablet, Rfl: 6 .  nitroGLYCERIN (NITROSTAT) 0.4 MG SL tablet, Place 1 tablet (0.4 mg total) under the tongue every 5 (five) minutes as needed for chest pain., Disp: 50 tablet, Rfl: 1 .  ondansetron (ZOFRAN) 4 MG tablet, Take 4 mg by mouth every 8 (eight) hours as needed for nausea or vomiting., Disp: , Rfl:  .  pantoprazole (PROTONIX) 40 MG tablet, Take 1 tablet (40 mg total) by mouth daily., Disp: 30 tablet, Rfl: 6 .  promethazine (PHENERGAN) 12.5 MG tablet, Take 12.5 mg by mouth every 6 (six) hours as needed for  nausea or vomiting., Disp: , Rfl:  .  ranitidine (ZANTAC) 150 MG tablet, Take 1 tablet (150 mg total) by mouth at bedtime., Disp: 30 tablet, Rfl: 5 .  traZODone (DESYREL) 50 MG tablet, Take 0.5-1 tablets (25-50 mg total) by mouth at bedtime as needed for sleep., Disp: 30 tablet, Rfl: 3  Objective: BP 120/82 (BP Location: Left Arm, Patient Position: Sitting, Cuff Size: Large)   Pulse 62   Temp 97.9 F (36.6 C) (Oral)   Ht 5\' 10"  (1.778 m)   Wt 238 lb 8 oz (108.2 kg)   SpO2 97%   BMI 34.22 kg/m  General: Awake, appears stated age HEENT: Perforation in septum noted, L nare neg w/o discharge or bleeding, R nare patent, R side of septum is with varicosity and oozing blood Lungs: No accessory muscle use Psych: Age appropriate judgment and insight, normal affect and mood  Assessment and Plan: Epistaxis - Plan: Ambulatory referral to ENT  Perforated nasal septum  Orders as above. Discussed the fact that I do not have adequate anesthetic here, but could cauterize chemically. He opted to see ENT. Recommended keeping home humid at night with humidifier but he stated it already is and he was considering a dehumidifier. Stay on Plavix and ASA for now given recent NSTEMI. Vaseline vs TAO for inside of nose BID.  F/u prn.  The patient voiced understanding and agreement to the plan.  Garibaldi, DO 02/02/17  9:54 AM

## 2017-02-05 ENCOUNTER — Ambulatory Visit (INDEPENDENT_AMBULATORY_CARE_PROVIDER_SITE_OTHER): Payer: BLUE CROSS/BLUE SHIELD | Admitting: Cardiology

## 2017-02-05 ENCOUNTER — Encounter: Payer: Self-pay | Admitting: Cardiology

## 2017-02-05 VITALS — BP 124/68 | HR 62 | Ht 70.0 in | Wt 240.0 lb

## 2017-02-05 DIAGNOSIS — I214 Non-ST elevation (NSTEMI) myocardial infarction: Secondary | ICD-10-CM

## 2017-02-05 DIAGNOSIS — I255 Ischemic cardiomyopathy: Secondary | ICD-10-CM | POA: Diagnosis not present

## 2017-02-05 DIAGNOSIS — I10 Essential (primary) hypertension: Secondary | ICD-10-CM

## 2017-02-05 DIAGNOSIS — I2510 Atherosclerotic heart disease of native coronary artery without angina pectoris: Secondary | ICD-10-CM | POA: Diagnosis not present

## 2017-02-05 DIAGNOSIS — R58 Hemorrhage, not elsewhere classified: Secondary | ICD-10-CM

## 2017-02-05 DIAGNOSIS — E785 Hyperlipidemia, unspecified: Secondary | ICD-10-CM | POA: Diagnosis not present

## 2017-02-05 MED ORDER — METOPROLOL TARTRATE 25 MG PO TABS: 12.5000 mg | ORAL_TABLET | Freq: Two times a day (BID) | ORAL | 2 refills | Status: DC

## 2017-02-05 MED ORDER — EZETIMIBE 10 MG PO TABS: 10.0000 mg | ORAL_TABLET | Freq: Every day | ORAL | 2 refills | Status: DC

## 2017-02-05 NOTE — Patient Instructions (Addendum)
Medication Instructions:   DECREASE YOUR METOPROLOL TO 12.5 MG BY MOUTH TWICE DAILY  START TAKING ZETIA 10 MG ONCE DAILY     Testing/Procedures:  Your physician has requested that you have an echocardiogram. Echocardiography is a painless test that uses sound waves to create images of your heart. It provides your doctor with information about the size and shape of your heart and how well your heart's chambers and valves are working. This procedure takes approximately one hour. There are no restrictions for this procedure.      Follow-Up:  IN December 2018 WITH DR NELSON---PLEASE ADD THIS PATIENT INTO ONE OF DR Maryruth Hancock DAYS--END SLOT--PER DR Meda Coffee       If you need a refill on your cardiac medications before your next appointment, please call your pharmacy.

## 2017-02-05 NOTE — Progress Notes (Signed)
Cardiology Office Note    Date:  02/05/2017   ID:  Marvion Bastidas, DOB [birth year], MRN [id number]  PCP:  [name], DO  Cardiologist: Dr. Meda Coffee  No chief complaint on file.   History of Present Illness:  Aldrick Derrig is a 56 y.o. male history of CAD status post NSTEMI treated with DES to the circumflex 11/2016 with irregularity in the LAD and RCA but no obstructive lesions. His cath also suggested acute diastolic CHF EF 82% and LVEDP 24. Relook cardiac catheterization for chest pain 12/06/16 showed mild to moderate nonobstructive coronary disease consistent with cath 11/27/16 with widely patent circumflex/OM stent, mildly elevated LV filling pressure 20 mmHg and EF 45%. He was given a dose of IV Lasix without significant improvement in pain. Colchicine was added for possible inflammation but he was unable to afford it. 2-D echo 11/28/16 moderate LVH EF 45-50% with hypokinesis of the basal mid lateral, inferolateral and inferior myocardium. He had post MI chest pain that was felt to be atypical. chronic combined CHF, HTN, HLD, tobacco abuse, GERD, history of drug abuse, TIA  Patient saw Melina Copa, PA-C 12/18/16 and he continued to have atypical chest pain reproducible with palpation and specific movements. EKG was benign. He was advised to take over-the-counter Tylenol as directed. He was told he could stay out of work for 6-8 weeks but he is not sure he wanted to stay out that long.  01/09/17 - Patient comes in today feeling well. He has been exercising but still has occasional chest pain. He says last night when he was walking after he ate dinner he had some pressure that eased with rest. He says it was different from his MI pain. Thinks he is having indigestion. He has gained 10 pounds on his scales, 5 pounds on ours since his last office visit. He is trying to watch his sodium carefully but is drinking a lot of Sprite and flavored waters. Start cardiac rehabilitation today. Would  like to go back to work tomorrow. Started on a steroid Dosepak today for sinus problems by primary care.  02/05/2017 - 1 months follow-up, the patient started to go to cardiac rehabilitation and experiences dizziness after he stops. He is also noticed exertional chest pain that resolves at rest. He has never tried nitroglycerin. He also experiences chest pain on deep inspiration. Has been compliant with his medication. Over the last month he has noticed nosebleeds every day and scheduled for cauterization he has also noticed bright red blood in his stool and went to the ER but no hemorrhoids were identified. He had colonoscopy 2 years ago that was normal. He walks 1/2 miles twice a week and walks a lot at work. He has no side effects from Lipitor. Denies palpitations or falls.   Past Medical History:  Diagnosis Date  . BPH (benign prostatic hyperplasia)   . CAD (coronary artery disease)    a. NSTEMI troponin >65 with cath 11/2016 S/p PTCA & DES to mid circumflex; irregularies in LAD & RCA, LVEDP 24, EF 50% and 45-50% by echo.  . Chronic combined systolic and diastolic CHF (congestive heart failure) (HCC)    a. EF 45%.  . Cluster headache    hx of  . Complication of anesthesia    difficult waking up"  . Diverticulosis   . Drug abuse ([place])    hx of  . GERD (gastroesophageal reflux disease)   . Hiatal hernia   . Hyperlipidemia   . Hypertension   .  Hypothyroidism   . Nontoxic multinodular goiter   . NSTEMI (non-ST elevated myocardial infarction) ([place])   . Perforated nasal septum   . Pulmonary nodule --- CT 10/19/2014: Nodule is stable, no further routine x-rays 01/06/2009  . TIA (transient ischemic attack)   . Trigeminal neuralgia   . Tubular adenoma of colon 12/2015  . Unspecified asthma(493.90)     Past Surgical History:  Procedure Laterality Date  . APPENDECTOMY    . CARDIAC CATHETERIZATION N/A 11/25/2014   Procedure: Left Heart Cath and Coronary Angiography;  Surgeon: Belva Crome,  MD;  Location: [facility];  Service: Cardiovascular;  Laterality: N/A;  . CARDIAC CATHETERIZATION  12/08/2008   Archie Endo 08/24/2010  . COLECTOMY  ~ 1976   "I had a blockage"  . CORONARY STENT INTERVENTION N/A 11/27/2016   Procedure: CORONARY STENT INTERVENTION;  Surgeon: Belva Crome, MD;  Location: [facility];  Service: Cardiovascular;  Laterality: N/A;  . CORONARY STENT PLACEMENT  11/27/2016   STENT XIENCE ALPINE RX 3.0X15 drug eluting stent was successfully placed  . CYST EXCISION Left 10/31/2016   Jaw  . DESTRUCTION TRIGEMINAL NERVE VIA NEUROLYTIC AGENT  x 3 , R side last ~2010  . KNEE ARTHROSCOPY Right 06/26/2000   Arthroscopy followed by open lateral release/notes 09/06/2010  . LEFT HEART CATH AND CORONARY ANGIOGRAPHY N/A 11/27/2016   Procedure: LEFT HEART CATH AND CORONARY ANGIOGRAPHY;  Surgeon: Belva Crome, MD;  Location: [facility];  Service: Cardiovascular;  Laterality: N/A;  . LEFT HEART CATH AND CORONARY ANGIOGRAPHY N/A 12/07/2016   Procedure: LEFT HEART CATH AND CORONARY ANGIOGRAPHY;  Surgeon: Nelva Bush, MD;  Location: [facility];  Service: Cardiovascular;  Laterality: N/A;  . 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 Ardine Bjork 09/06/2010  . SHOULDER HEMI-ARTHROPLASTY Right 05/26/2010   revision/notes 05/27/2010    Current Medications: Current Meds  Medication Sig  . aspirin EC 81 MG tablet Take 81 mg by mouth daily.  Marland Kitchen atorvastatin (LIPITOR) 80 MG tablet Take 1 tablet (80 mg total) by mouth at bedtime.  . chlorhexidine (PERIDEX) 0.12 % solution Use as directed 10 mLs in the mouth or throat 2 (two) times daily. As directed.  . clopidogrel (PLAVIX) 75 MG tablet Take 1 tablet (75 mg total) by mouth daily with breakfast.  . colchicine 0.6 MG tablet Take 1 tablet (0.6 mg total) by mouth 2 (two) times daily.  . fluticasone (FLONASE) 50 MCG/ACT nasal spray Place 2 sprays into both nostrils daily.  . furosemide (LASIX) 20 MG  tablet Take 1 tablet (20 mg total) by mouth daily.  . isosorbide mononitrate (IMDUR) 30 MG 24 hr tablet Take 1 tablet (30 mg total) by mouth daily.  Marland Kitchen losartan (COZAAR) 100 MG tablet Take 1 tablet (100 mg total) by mouth daily.  . nitroGLYCERIN (NITROSTAT) 0.4 MG SL tablet Place 1 tablet (0.4 mg total) under the tongue every 5 (five) minutes as needed for chest pain.  Marland Kitchen ondansetron (ZOFRAN) 4 MG tablet Take 4 mg by mouth every 8 (eight) hours as needed for nausea or vomiting.  . pantoprazole (PROTONIX) 40 MG tablet Take 1 tablet (40 mg total) by mouth daily.  . promethazine (PHENERGAN) 12.5 MG tablet Take 12.5 mg by mouth every 6 (six) hours as needed for nausea or vomiting.  . ranitidine (ZANTAC) 150 MG tablet Take 1 tablet (150 mg total) by mouth at bedtime.  . traZODone (DESYREL) 50 MG tablet Take 0.5-1 tablets (25-50  mg total) by mouth at bedtime as needed for sleep.  . [DISCONTINUED] metoprolol tartrate (LOPRESSOR) 25 MG tablet Take 1 tablet (25 mg total) by mouth 2 (two) times daily.     Allergies:   Patient has no known allergies.   Social History   Social History  . Marital status: Married    Spouse name: N/A  . Number of children: 2  . 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 Smoking status: Former Smoker    Packs/day: 0.25    Years: 12.00    Types: Cigarettes    Quit date: 11/15/2016  . Smokeless tobacco: Never Used  . Alcohol use Yes     Comment: 12/07/2016 "maybe 3 mixed drinks/month"  . Drug use: No  . Sexual activity: Yes   Other Topics Concern  . None   Social History Narrative   HSG   Culinary school in [place]   Married - '84 - 5 years divorced; remarried '96   1 son - '85                  Family History:  The patient's   family history includes CAD in his father; Cancer in his father; Diabetes in his mother; Heart disease in his father and maternal aunt; Heart failure in his  father; Hyperlipidemia in his mother; Hypertension in his father and mother; Mesothelioma in his father; Sudden death in his father.   ROS:   Please see the history of present illness.    Review of Systems  Constitution: Positive for weight gain.  HENT: Positive for congestion.   Cardiovascular: Positive for chest pain and leg swelling.  Respiratory: Negative.   Endocrine: Negative.   Hematologic/Lymphatic: Negative.   Musculoskeletal: Negative.   Gastrointestinal: Positive for heartburn.  Genitourinary: Negative.   Neurological: Negative.    All other systems reviewed and are negative.   PHYSICAL EXAM:   VS:  BP 124/68   Pulse 62   Ht 5\' 10"  (1.778 m)   Wt 240 lb (108.9 kg)   SpO2 97%   BMI 34.44 kg/m   Physical Exam  GEN: Obese, in no acute distress  Neck: Slight increase JVD, no carotid bruits, or masses Cardiac:RRR; positive S4 no murmurs, rubs   Respiratory:  clear to auscultation bilaterally, normal work of breathing GI: soft, nontender, nondistended, + BS Ext: Trace of ankle edema without cyanosis, clubbing , Good distal pulses bilaterally Neuro:  Alert and Oriented x 3 Psych: euthymic mood, full affect  Wt Readings from Last 3 Encounters:  02/05/17 240 lb (108.9 kg)  02/02/17 238 lb 8 oz (108.2 kg)  01/22/17 240 lb (108.9 kg)      Studies/Labs Reviewed:   EKG:  EKG is not ordered today.    Recent Labs: 10/09/2016: Magnesium 2.1 11/24/2016: TSH 0.96 12/07/2016: B Natriuretic Peptide 64.6 01/22/2017: ALT 25; BUN 23; Creatinine, Ser 1.05; Hemoglobin 15.6; Platelets 150; Potassium 3.9; Sodium 137   Lipid Panel    Component Value Date/Time   CHOL 248 (H) 01/10/2017 1159   TRIG 44 01/10/2017 1159   HDL 95 01/10/2017 1159   CHOLHDL 2.6 01/10/2017 1159   CHOLHDL 3.8 11/27/2016 0123   VLDL 20 11/27/2016 0123   LDLCALC 144 (H) 01/10/2017 1159   LDLDIRECT 209.8 07/20/2009 0951    Additional studies/ records that were reviewed today include:  2-D echo  8/7/18Study Conclusions   - Left  ventricle: The cavity size was normal. There was moderate   concentric hypertrophy. Systolic function was mildly reduced. The   estimated ejection fraction was in the range of 45% to 50%. There   is hypokinesis of the basal-midlateral, inferolateral, and   inferior myocardium. - Right ventricle: The cavity size was mildly dilated. Wall   thickness was normal. - Right atrium: The atrium was mildly to moderately dilated. - Atrial septum: There was increased thickness of the septum,   consistent with lipomatous hypertrophy. - Pulmonic valve: There was trivial regurgitation. - Pulmonary arteries: Systolic pressure could not be accurately   estimated.   ------------------------------------------------------------------- Study data:  Comparison was made to the study of 10/19/2014.  Study status:  Routine.  Procedure:  Transthoracic echocardiography. Image quality was adequate.  Study completion:  There were no complications.          Transthoracic echocardiography.  M-mode, complete 2D, spectral Doppler, and color Doppler.  Birthdate: Patient birthdate: [birth year].  Age:  Patient is 56 yr old.  Sex: Gender: male.    BMI: 32.6 kg/m^2.  Blood pressure:     114/70 Patient status:  Inpatient.  Study date:  Study date: 11/28/2016.  Cardiac catheterization 8/6/18Conclusion   Non-ST elevation myocardial infarction presentation with ongoing chest pain and rising troponin greater than 9.  Total occlusion of the mid circumflex with prolonged pain presentation due to the presence of collaterals.   Successful PCI of the mid circumflex total occlusion reducing the obstruction to less than 20% using a 3.0 Xience [place] drug-eluting stent to restorie TIMI grade 3 flow. Final postdilatation balloon diameter was 3.5 [place] balloon.  Irregularities are noted in the LAD and right coronary but no significant obstructive lesions are seen.  Acute diastolic heart failure with LVEF  50%, and EDP 24 mmHg.   RECOMMENDATIONS:    Aspirin and Plavix for at least 6 months and preferably 12.  Aggressive risk factor modification.    Cardiac cath 8/16/18Conclusion  Conclusions: 1. Mild to moderate, non-obstructive coronary artery disease similar to conclusion of prior catheterization on 11/27/16. 2. Widely patent LCx/OM stent. 3. Mildly elevated left ventricular filling pressure with LVEF 45%.   Recommendations: 1. Continue DAPT with ASA and clopidogrel x 1 year. 2. Start isosorbide mononitrate (as patient was not taking this medication at home after recent hospital discharge). 3. Begin furosemide for possible component of heart failure. 4. If pain continues despite aforementioned interventions, consider empiric treatment for pericarditis, given recent MI.   Nelva Bush, MD [facility] Pager: [phone]       ASSESSMENT:    1. Coronary artery disease involving native coronary artery of native heart without angina pectoris   2. Hyperlipidemia, unspecified hyperlipidemia type   3. NSTEMI (non-ST elevated myocardial infarction) ([place])   4. Ischemic cardiomyopathy   5. Essential hypertension   6. Bleeding      PLAN:  In order of problems listed above:  1. CAD status post NSTEMI 11/27/16 treated with DES to the mid circumflex otherwise nonobstructive disease. Follow-up cath for recurrent chest pain 12/07/2016 mild to moderate nonobstructive CAD widely patent complex/OM stent LVEF 45% with mildly elevated left ventricular filling pressures.Patient continues to have mild chest pains that are unlike his MI pain.  These pains are sometimes exertional and sometimes not, they're to same as before his repeat cath on 12/07/2016. He is encouraged to continue cardiac rehabilitation, and take sublingual nitroglycerin if needed. I will decreased dose of metoprolol to 12.5 by mouth twice a  day to decrease dizziness after exercise.  2. Bleeding - nosebleeds in his  schedule for cauterization as well as rectal bleeding. We will continue aspirin and Plavix for at least 3 months will follow in 2 months and if he continues to bleed or his symptoms get worse we will consider discontinuation of aspirin.  3. Ischemic cardiomyopathy - LVEF 45% after non-STEMI we'll repeat echocardiogram now.   4. Essential hypertension - well controlled.  5. Chronic diastolic CHF EF 01%. He appears euvolemic.  6. Hypercholesterolemia. Baseline LDL 209, he was started atorvastatin 80 mg daily. LDL now 144, we will add Zetia 10 mg po daily and recheck at the next visit.  Check Lipids, CMP, CBC at the next visit.  Medication Adjustments/Labs and Tests Ordered: Current medicines are reviewed at length with the patient today.  Concerns regarding medicines are outlined above.  Medication changes, Labs and Tests ordered today are listed in the Patient Instructions below. Patient Instructions  Medication Instructions:   DECREASE YOUR METOPROLOL TO 12.5 MG BY MOUTH TWICE DAILY     Testing/Procedures:  Your physician has requested that you have an echocardiogram. Echocardiography is a painless test that uses sound waves to create images of your heart. It provides your doctor with information about the size and shape of your heart and how well your heart's chambers and valves are working. This procedure takes approximately one hour. There are no restrictions for this procedure.      Follow-Up:  IN December 2018 WITH DR Bonnell Placzek---PLEASE ADD THIS PATIENT INTO ONE OF DR Maryruth Hancock DAYS--END SLOT--PER DR Meda Coffee       If you need a refill on your cardiac medications before your next appointment, please call your pharmacy.      Signed, Ena Dawley, MD  02/05/2017 10:38 AM    [address], [place], [place]  [address] Phone: [phone]; Fax: [phone]

## 2017-02-09 ENCOUNTER — Encounter (HOSPITAL_BASED_OUTPATIENT_CLINIC_OR_DEPARTMENT_OTHER): Payer: Self-pay | Admitting: *Deleted

## 2017-02-09 ENCOUNTER — Telehealth: Payer: Self-pay | Admitting: Family Medicine

## 2017-02-09 ENCOUNTER — Emergency Department (HOSPITAL_BASED_OUTPATIENT_CLINIC_OR_DEPARTMENT_OTHER)
Admission: EM | Admit: 2017-02-09 | Discharge: 2017-02-09 | Disposition: A | Discharge status: Home / Self Care | Payer: BLUE CROSS/BLUE SHIELD | Attending: Emergency Medicine | Admitting: Emergency Medicine

## 2017-02-09 DIAGNOSIS — J45909 Unspecified asthma, uncomplicated: Secondary | ICD-10-CM | POA: Insufficient documentation

## 2017-02-09 DIAGNOSIS — I5042 Chronic combined systolic (congestive) and diastolic (congestive) heart failure: Secondary | ICD-10-CM | POA: Insufficient documentation

## 2017-02-09 DIAGNOSIS — W57XXXA Bitten or stung by nonvenomous insect and other nonvenomous arthropods, initial encounter: Secondary | ICD-10-CM | POA: Insufficient documentation

## 2017-02-09 DIAGNOSIS — Y990 Civilian activity done for income or pay: Secondary | ICD-10-CM | POA: Diagnosis not present

## 2017-02-09 DIAGNOSIS — I110 Hypertensive heart disease with heart failure: Secondary | ICD-10-CM | POA: Diagnosis not present

## 2017-02-09 DIAGNOSIS — Z7982 Long term (current) use of aspirin: Secondary | ICD-10-CM | POA: Diagnosis not present

## 2017-02-09 DIAGNOSIS — S1086XA Insect bite of other specified part of neck, initial encounter: Secondary | ICD-10-CM | POA: Diagnosis present

## 2017-02-09 DIAGNOSIS — Y939 Activity, unspecified: Secondary | ICD-10-CM | POA: Diagnosis not present

## 2017-02-09 DIAGNOSIS — Z8673 Personal history of transient ischemic attack (TIA), and cerebral infarction without residual deficits: Secondary | ICD-10-CM | POA: Insufficient documentation

## 2017-02-09 DIAGNOSIS — I2510 Atherosclerotic heart disease of native coronary artery without angina pectoris: Secondary | ICD-10-CM | POA: Diagnosis not present

## 2017-02-09 DIAGNOSIS — E039 Hypothyroidism, unspecified: Secondary | ICD-10-CM | POA: Insufficient documentation

## 2017-02-09 DIAGNOSIS — Z87891 Personal history of nicotine dependence: Secondary | ICD-10-CM | POA: Insufficient documentation

## 2017-02-09 DIAGNOSIS — Z7902 Long term (current) use of antithrombotics/antiplatelets: Secondary | ICD-10-CM | POA: Insufficient documentation

## 2017-02-09 DIAGNOSIS — Y929 Unspecified place or not applicable: Secondary | ICD-10-CM | POA: Insufficient documentation

## 2017-02-09 DIAGNOSIS — Z79899 Other long term (current) drug therapy: Secondary | ICD-10-CM | POA: Insufficient documentation

## 2017-02-09 MED ORDER — SULFAMETHOXAZOLE-TRIMETHOPRIM 800-160 MG PO TABS: 1.0000 | ORAL_TABLET | Freq: Two times a day (BID) | ORAL | 0 refills | Status: AC

## 2017-02-09 MED FILL — SULFAMETHOXAZOLE/TMP DS TAB: 800-160 | 7 days supply | Qty: 14 | Fill #0

## 2017-02-09 NOTE — Discharge Instructions (Signed)
Take benadryl 50 mg every 6 hrs as needed for itchiness and swelling and trouble breathing. You likely had a reaction to the insect bite.   If the swelling is worse tomorrow, start bactrim twice daily for a week.   Return to ER if you have worse neck swelling, trouble breathing, worse rash, difficulty moving the neck, fevers, purulent discharge.

## 2017-02-09 NOTE — Telephone Encounter (Signed)
Patient Name: [name] DOB: 1960-12-01 Initial Comment Caller states he was bitten by something on the neck, some swallowing difficulty and labored breathing Nurse Assessment Nurse: Lavera Guise, RN, [facility]asta Date/Time (Eastern Time): 02/09/2017 12:41:05 PM Confirm and document reason for call. If symptomatic, describe symptoms. ---Caller states he was bitten by an insect or something on the neck about 1120 a.m. Now having some swallowing difficulty and labored breathing. Keeps trying to swallow and never does that. Breathing is laboring and neck is getting stiff. Area is swollen. Does the patient have any new or worsening symptoms? ---Yes Will a triage be completed? ---Yes Related visit to physician within the last 2 weeks? ---N/A Does the PT have any chronic conditions? (i.e. diabetes, asthma, etc.) ---Yes List chronic conditions. ---MI 2 months ago Is this a behavioral health or substance abuse call? ---No Guidelines Guideline Title Affirmed Question Affirmed Notes Breathing Difficulty [1] MODERATE difficulty breathing (e.g., speaks in phrases, SOB even at rest, pulse 100-120) AND [2] NEW-onset or WORSE than normal Final Disposition User Go to ED Now Lavera Guise, RN, [facility] Referrals [place] [facility] - ED Caller Disagree/Comply Comply Caller Understands Yes PreDisposition Call Doctor

## 2017-02-09 NOTE — ED Provider Notes (Signed)
[facility] EMERGENCY DEPARTMENT Provider Note   CSN: [id number] Arrival date & time: 02/09/17  1307     History   Chief Complaint Chief Complaint  Patient presents with  . Insect Bite    HPI [name] is a 56 y.o. male history of CAD status post stent, previous TIA here presenting with possible insect bite. Patient states that he walked into work and noticed that something bit him on the right side of his neck. He hit it and did not know what kind insect it was. He states that there is some swelling on the right side of his neck and he has some subjective shortness of breath but denies any throat closing. Did not take any medicines prior to arrival. He states that he felt that there may be a rash there but denies being itchy. Denies any previous skin infections and denies any fevers or drainage.  The history is provided by the patient.    Past Medical History:  Diagnosis Date  . BPH (benign prostatic hyperplasia)   . CAD (coronary artery disease)    a. NSTEMI troponin >65 with cath 11/2016 S/p PTCA & DES to mid circumflex; irregularies in LAD & RCA, LVEDP 24, EF 50% and 45-50% by echo.  . Chronic combined systolic and diastolic CHF (congestive heart failure) (HCC)    a. EF 45%.  . Cluster headache    hx of  . Complication of anesthesia    difficult waking up"  . Diverticulosis   . Drug abuse ([place])    hx of  . GERD (gastroesophageal reflux disease)   . Hiatal hernia   . Hyperlipidemia   . Hypertension   . Hypothyroidism   . Nontoxic multinodular goiter   . NSTEMI (non-ST elevated myocardial infarction) ([place])   . Perforated nasal septum   . Pulmonary nodule --- CT 10/19/2014: Nodule is stable, no further routine x-rays 01/06/2009  . TIA (transient ischemic attack)   . Trigeminal neuralgia   . Tubular adenoma of colon 12/2015  . Unspecified asthma(493.90)     Patient Active Problem List   Diagnosis Date Noted  . Ischemic cardiomyopathy 02/05/2017  .  Obesity 12/08/2016  . Chronic diastolic CHF (congestive heart failure) ([place]) 12/08/2016  . Atypical chest pain 12/07/2016  . NSTEMI (non-ST elevated myocardial infarction) ([place]) 11/26/2016  . Intractable nausea and vomiting 10/09/2016  . Nausea vomiting and diarrhea 10/09/2016  . Enteritis due to Clostridium difficile 10/30/2015  . Malnutrition of moderate degree 10/21/2015  . Acute kidney injury ([place]) 10/20/2015  . PCP NOTES >>>>>>>>>>>>>>>>> 09/30/2015  . Hypercholesteremia 09/22/2015  . Renal mass, left 11/02/2014  . CAD (coronary artery disease)   . Drug abuse and dependence ([place]) 09/18/2014  . Visit for preventive health examination 02/04/2014  . Screening for ischemic heart disease 02/04/2014  . BPH associated with nocturia 12/24/2013  . Hypothyroidism 12/24/2013  . Chronic ethmoidal sinusitis 12/24/2013  . Low back pain radiating to right leg 05/10/2012  . Thrombocytopenia ([place]) 12/05/2011  . Tobacco use 11/30/2011  . Anxiety 12/26/2010  . Transient cerebral ischemia 01/07/2010  . Asthma 01/19/2009  . Pulmonary nodule --- CT 10/19/2014: Nodule is stable, no further routine x-rays 01/06/2009  . Nontoxic multinodular goiter 12/31/2008  . DYSPNEA ON EXERTION 12/15/2008  . Trigeminal neuralgia 07/24/2007  . Dyslipidemia 12/27/2006  . Cluster headache 12/27/2006  . Essential hypertension 12/27/2006  . GERD 12/27/2006    Past Surgical History:  Procedure Laterality Date  . APPENDECTOMY    .  CARDIAC CATHETERIZATION N/A 11/25/2014   Procedure: Left Heart Cath and Coronary Angiography;  Surgeon: Belva Crome, MD;  Location: [facility];  Service: Cardiovascular;  Laterality: N/A;  . CARDIAC CATHETERIZATION  12/08/2008   Archie Endo 08/24/2010  . COLECTOMY  ~ 1976   "I had a blockage"  . CORONARY STENT INTERVENTION N/A 11/27/2016   Procedure: CORONARY STENT INTERVENTION;  Surgeon: Belva Crome, MD;  Location: [facility];  Service: Cardiovascular;  Laterality: N/A;    . CORONARY STENT PLACEMENT  11/27/2016   STENT XIENCE ALPINE RX 3.0X15 drug eluting stent was successfully placed  . CYST EXCISION Left 10/31/2016   Jaw  . DESTRUCTION TRIGEMINAL NERVE VIA NEUROLYTIC AGENT  x 3 , R side last ~2010  . KNEE ARTHROSCOPY Right 06/26/2000   Arthroscopy followed by open lateral release/notes 09/06/2010  . LEFT HEART CATH AND CORONARY ANGIOGRAPHY N/A 11/27/2016   Procedure: LEFT HEART CATH AND CORONARY ANGIOGRAPHY;  Surgeon: Belva Crome, MD;  Location: [facility];  Service: Cardiovascular;  Laterality: N/A;  . LEFT HEART CATH AND CORONARY ANGIOGRAPHY N/A 12/07/2016   Procedure: LEFT HEART CATH AND CORONARY ANGIOGRAPHY;  Surgeon: Nelva Bush, MD;  Location: [facility];  Service: Cardiovascular;  Laterality: N/A;  . 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 Ardine Bjork 09/06/2010  . SHOULDER HEMI-ARTHROPLASTY Right 05/26/2010   revision/notes 05/27/2010       Home Medications    Prior to Admission medications   Medication Sig Start Date End Date Taking? Authorizing Provider  aspirin EC 81 MG tablet Take 81 mg by mouth daily.    [provider]  atorvastatin (LIPITOR) 80 MG tablet Take 1 tablet (80 mg total) by mouth at bedtime. 11/30/16   Bhagat, Crista Luria, PA  chlorhexidine (PERIDEX) 0.12 % solution Use as directed 10 mLs in the mouth or throat 2 (two) times daily. As directed. 10/23/16   [provider]  clopidogrel (PLAVIX) 75 MG tablet Take 1 tablet (75 mg total) by mouth daily with breakfast. 11/30/16   Bhagat, Bhavinkumar, PA  colchicine 0.6 MG tablet Take 1 tablet (0.6 mg total) by mouth 2 (two) times daily. 12/08/16   Dunn, Nedra Hai, PA-C  ezetimibe (ZETIA) 10 MG tablet Take 1 tablet (10 mg total) by mouth daily. 02/05/17 05/06/17  Dorothy Spark, MD  fluticasone (FLONASE) 50 MCG/ACT nasal spray Place 2 sprays into both nostrils daily. 01/05/17   [name], DO  furosemide (LASIX) 20 MG  tablet Take 1 tablet (20 mg total) by mouth daily. 01/09/17 04/09/17  Imogene Burn, PA-C  isosorbide mononitrate (IMDUR) 30 MG 24 hr tablet Take 1 tablet (30 mg total) by mouth daily. 11/30/16   Bhagat, Crista Luria, PA  losartan (COZAAR) 100 MG tablet Take 1 tablet (100 mg total) by mouth daily. 12/06/16   [name], DO  metoprolol tartrate (LOPRESSOR) 25 MG tablet Take 0.5 tablets (12.5 mg total) by mouth 2 (two) times daily. 02/05/17 05/06/17  Dorothy Spark, MD  nitroGLYCERIN (NITROSTAT) 0.4 MG SL tablet Place 1 tablet (0.4 mg total) under the tongue every 5 (five) minutes as needed for chest pain. 12/06/16   [name], DO  ondansetron (ZOFRAN) 4 MG tablet Take 4 mg by mouth every 8 (eight) hours as needed for nausea or vomiting.    [provider]  pantoprazole (PROTONIX) 40 MG tablet Take 1 tablet (40 mg total) by mouth daily. 11/30/16   Leanor Kail,  PA  promethazine (PHENERGAN) 12.5 MG tablet Take 12.5 mg by mouth every 6 (six) hours as needed for nausea or vomiting.    [provider]  ranitidine (ZANTAC) 150 MG tablet Take 1 tablet (150 mg total) by mouth at bedtime. 11/13/16 11/13/17  Ladene Artist, MD  sulfamethoxazole-trimethoprim (BACTRIM DS,SEPTRA DS) 800-160 MG tablet Take 1 tablet by mouth 2 (two) times daily. 02/09/17 02/16/17  Drenda Freeze, MD  traZODone (DESYREL) 50 MG tablet Take 0.5-1 tablets (25-50 mg total) by mouth at bedtime as needed for sleep. 09/05/16   Debbrah Alar, NP    Family History Family History  Problem Relation Age of Onset  . Diabetes Mother   . Hyperlipidemia Mother   . Hypertension Mother   . Hypertension Father   . Heart disease Father   . Cancer Father   . Sudden death Father        OCT 07-21-2009  . CAD Father   . Heart failure Father   . Mesothelioma Father   . Heart disease Maternal Aunt   . Prostate cancer Neg Hx   . Colon cancer Neg Hx     Social History Social History   Substance Use Topics  . Smoking status: Former Smoker    Packs/day: 0.25    Years: 12.00    Types: Cigarettes    Quit date: 11/15/2016  . Smokeless tobacco: Never Used  . Alcohol use Yes     Comment: 12/07/2016 "maybe 3 mixed drinks/month"     Allergies   Patient has no known allergies.   Review of Systems Review of Systems  Skin: Positive for rash.  All other systems reviewed and are negative.    Physical Exam Updated Vital Signs BP 113/84 (BP Location: Left Arm)   Pulse 66   Temp 98.2 F (36.8 C) (Oral)   Resp 16   Ht 5\' 10"  (1.778 m)   Wt 108.9 kg (240 lb)   SpO2 100%   BMI 34.44 kg/m   Physical Exam  Constitutional: He is oriented to person, place, and time. He appears well-developed.  HENT:  Head: Normocephalic.  Right Ear: External ear normal.  Left Ear: External ear normal.  Mouth/Throat: Oropharynx is clear and moist.  OP clear   Eyes: Pupils are equal, round, and reactive to light. Conjunctivae and EOM are normal.  Neck: Normal range of motion. Neck supple.  There is an area on the right side of the neck slightly swollen and erythematous, able to range the neck. No obvious purulent discharge   Cardiovascular: Normal rate, regular rhythm and normal heart sounds.   Pulmonary/Chest: Effort normal and breath sounds normal. No respiratory distress. He has no wheezes.  Abdominal: Soft. Bowel sounds are normal. He exhibits no distension. There is no tenderness.  Musculoskeletal: Normal range of motion.  Neurological: He is alert and oriented to person, place, and time. No cranial nerve deficit. Coordination normal.  Skin: Skin is warm.  Psychiatric: He has a normal mood and affect.  Nursing note and vitals reviewed.    ED Treatments / Results  Labs (all labs ordered are listed, but only abnormal results are displayed) Labs Reviewed - No data to display  EKG  EKG Interpretation None       Radiology No results found.  Procedures Procedures  (including critical care time)  Medications Ordered in ED Medications - No data to display   Initial Impression / Assessment and Plan / ED Course  I have reviewed the triage  vital signs and the nursing notes.  Pertinent labs & imaging results that were available during my care of the patient were reviewed by me and considered in my medical decision making (see chart for details).     [name] is a 56 y.o. male here with R neck pain after insect bite. Local redness so I consider inflammation vs early cellulitis. Subjective SOB but lungs clear and OP clear and he is not hypoxic and has no signs of anaphylaxis. Recommend taking benadryl at home, start bactrim tomorrow if he has worsening redness or pain or swelling. Gave strict return precautions    Final Clinical Impressions(s) / ED Diagnoses   Final diagnoses:  Insect bite, initial encounter    New Prescriptions New Prescriptions   SULFAMETHOXAZOLE-TRIMETHOPRIM (BACTRIM DS,SEPTRA DS) 800-160 MG TABLET    Take 1 tablet by mouth 2 (two) times daily.     Drenda Freeze, MD 02/09/17 [phone]

## 2017-02-09 NOTE — ED Triage Notes (Signed)
While at work this am he felt something on his neck and slapped it. He never saw an insect but feels an insect bit him swelling to the right side of his neck.

## 2017-02-13 ENCOUNTER — Ambulatory Visit (HOSPITAL_COMMUNITY): Payer: BLUE CROSS/BLUE SHIELD | Attending: Cardiology

## 2017-02-13 ENCOUNTER — Other Ambulatory Visit: Payer: Self-pay

## 2017-02-13 DIAGNOSIS — I252 Old myocardial infarction: Secondary | ICD-10-CM | POA: Insufficient documentation

## 2017-02-13 DIAGNOSIS — Z720 Tobacco use: Secondary | ICD-10-CM | POA: Insufficient documentation

## 2017-02-13 DIAGNOSIS — I2510 Atherosclerotic heart disease of native coronary artery without angina pectoris: Secondary | ICD-10-CM | POA: Insufficient documentation

## 2017-02-13 DIAGNOSIS — I255 Ischemic cardiomyopathy: Secondary | ICD-10-CM | POA: Diagnosis not present

## 2017-02-13 DIAGNOSIS — I509 Heart failure, unspecified: Secondary | ICD-10-CM | POA: Insufficient documentation

## 2017-02-13 DIAGNOSIS — E785 Hyperlipidemia, unspecified: Secondary | ICD-10-CM | POA: Insufficient documentation

## 2017-02-13 DIAGNOSIS — E669 Obesity, unspecified: Secondary | ICD-10-CM | POA: Insufficient documentation

## 2017-02-13 DIAGNOSIS — I214 Non-ST elevation (NSTEMI) myocardial infarction: Secondary | ICD-10-CM | POA: Diagnosis not present

## 2017-02-13 DIAGNOSIS — I110 Hypertensive heart disease with heart failure: Secondary | ICD-10-CM | POA: Diagnosis not present

## 2017-02-23 ENCOUNTER — Encounter: Payer: Self-pay | Admitting: Family Medicine

## 2017-02-23 ENCOUNTER — Ambulatory Visit (INDEPENDENT_AMBULATORY_CARE_PROVIDER_SITE_OTHER): Payer: BLUE CROSS/BLUE SHIELD | Admitting: Family Medicine

## 2017-02-23 VITALS — BP 120/82 | HR 66 | Temp 98.3°F | Ht 70.0 in | Wt 240.2 lb

## 2017-02-23 DIAGNOSIS — R110 Nausea: Secondary | ICD-10-CM | POA: Diagnosis not present

## 2017-02-23 DIAGNOSIS — M545 Low back pain, unspecified: Secondary | ICD-10-CM

## 2017-02-23 DIAGNOSIS — Z23 Encounter for immunization: Secondary | ICD-10-CM

## 2017-02-23 LAB — POC URINALSYSI DIPSTICK (AUTOMATED)
Bilirubin, UA: NEGATIVE
Blood, UA: NEGATIVE
Glucose, UA: NEGATIVE
Ketones, UA: NEGATIVE
Leukocytes, UA: NEGATIVE
Nitrite, UA: NEGATIVE
Protein, UA: NEGATIVE
Spec Grav, UA: 1.025 (ref 1.010–1.025)
Urobilinogen, UA: 0.2 E.U./dL
pH, UA: 6 (ref 5.0–8.0)

## 2017-02-23 MED ORDER — METHYLPREDNISOLONE 4 MG PO TBPK: ORAL_TABLET | ORAL | 0 refills | Status: DC

## 2017-02-23 MED ORDER — ONDANSETRON HCL 4 MG PO TABS: 4.0000 mg | ORAL_TABLET | Freq: Three times a day (TID) | ORAL | 2 refills | Status: DC | PRN

## 2017-02-23 MED ORDER — CYCLOBENZAPRINE HCL 10 MG PO TABS: 10.0000 mg | ORAL_TABLET | Freq: Three times a day (TID) | ORAL | 0 refills | Status: DC | PRN

## 2017-02-23 NOTE — Addendum Note (Signed)
Addended by: Sharon Seller B on: 02/23/2017 03:41 PM   Modules accepted: Orders

## 2017-02-23 NOTE — Progress Notes (Signed)
Musculoskeletal Exam  Patient: [name] DOB: January 07, 1961  DOS: 02/23/2017  SUBJECTIVE:  Chief Complaint:   Chief Complaint  Patient presents with  . Nephrolithiasis    [name] is a 56 y.o.  male for evaluation and treatment of his back pain.   Onset:  6 days ago. Sudden, not related to any injury or change in activity.  Location: L lower Character:  aching and sharp  Progression of issue:  is unchanged Associated symptoms: None; hx of kidney stones and worried it may be that Denies bowel/bladder incontinence or weakness, no blood in urine or discharge/pain with urination, no fevers Treatment: to date has been acetaminophen.   Neurovascular symptoms: no  ROS: Gastrointestinal: no bowel incontinence Genitourinary: No bladder incontinence Musculoskeletal/Extremities: +back pain Neurologic: no numbness, tingling no weakness   Past Medical History:  Diagnosis Date  . BPH (benign prostatic hyperplasia)   . CAD (coronary artery disease)    a. NSTEMI troponin >65 with cath 11/2016 S/p PTCA & DES to mid circumflex; irregularies in LAD & RCA, LVEDP 24, EF 50% and 45-50% by echo.  . Chronic combined systolic and diastolic CHF (congestive heart failure) (HCC)    a. EF 45%.  . Cluster headache    hx of  . Complication of anesthesia    difficult waking up"  . Diverticulosis   . Drug abuse ([place])    hx of  . GERD (gastroesophageal reflux disease)   . Hiatal hernia   . Hyperlipidemia   . Hypertension   . Hypothyroidism   . Nontoxic multinodular goiter   . NSTEMI (non-ST elevated myocardial infarction) ([place])   . Perforated nasal septum   . Pulmonary nodule --- CT 10/19/2014: Nodule is stable, no further routine x-rays 01/06/2009  . TIA (transient ischemic attack)   . Trigeminal neuralgia   . Tubular adenoma of colon 12/2015  . Unspecified asthma(493.90)    Past Surgical History:  Procedure Laterality Date  . APPENDECTOMY    . CARDIAC CATHETERIZATION N/A  11/25/2014   Procedure: Left Heart Cath and Coronary Angiography;  Surgeon: Belva Crome, MD;  Location: [facility];  Service: Cardiovascular;  Laterality: N/A;  . CARDIAC CATHETERIZATION  12/08/2008   Archie Endo 08/24/2010  . COLECTOMY  ~ 1976   "I had a blockage"  . CORONARY STENT INTERVENTION N/A 11/27/2016   Procedure: CORONARY STENT INTERVENTION;  Surgeon: Belva Crome, MD;  Location: [facility];  Service: Cardiovascular;  Laterality: N/A;  . CORONARY STENT PLACEMENT  11/27/2016   STENT XIENCE ALPINE RX 3.0X15 drug eluting stent was successfully placed  . CYST EXCISION Left 10/31/2016   Jaw  . DESTRUCTION TRIGEMINAL NERVE VIA NEUROLYTIC AGENT  x 3 , R side last ~2010  . KNEE ARTHROSCOPY Right 06/26/2000   Arthroscopy followed by open lateral release/notes 09/06/2010  . LEFT HEART CATH AND CORONARY ANGIOGRAPHY N/A 11/27/2016   Procedure: LEFT HEART CATH AND CORONARY ANGIOGRAPHY;  Surgeon: Belva Crome, MD;  Location: [facility];  Service: Cardiovascular;  Laterality: N/A;  . LEFT HEART CATH AND CORONARY ANGIOGRAPHY N/A 12/07/2016   Procedure: LEFT HEART CATH AND CORONARY ANGIOGRAPHY;  Surgeon: Nelva Bush, MD;  Location: [facility];  Service: Cardiovascular;  Laterality: N/A;  . 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 Ardine Bjork 09/06/2010  . SHOULDER HEMI-ARTHROPLASTY Right 05/26/2010   revision/notes 05/27/2010   Family History  Problem Relation Age of Onset  . Diabetes Mother   . Hyperlipidemia Mother   .  Hypertension Mother   . Hypertension Father   . Heart disease Father   . Cancer Father   . Sudden death Father        OCT 08/17/2009  . CAD Father   . Heart failure Father   . Mesothelioma Father   . Heart disease Maternal Aunt   . Prostate cancer Neg Hx   . Colon cancer Neg Hx    Current Outpatient Prescriptions  Medication Sig Dispense Refill  . aspirin EC 81 MG tablet Take 81 mg by mouth daily.    Marland Kitchen atorvastatin (LIPITOR)  80 MG tablet Take 1 tablet (80 mg total) by mouth at bedtime. 30 tablet 6  . chlorhexidine (PERIDEX) 0.12 % solution Use as directed 10 mLs in the mouth or throat 2 (two) times daily. As directed.    . clopidogrel (PLAVIX) 75 MG tablet Take 1 tablet (75 mg total) by mouth daily with breakfast. 30 tablet 11  . colchicine 0.6 MG tablet Take 1 tablet (0.6 mg total) by mouth 2 (two) times daily. 60 tablet 1  . ezetimibe (ZETIA) 10 MG tablet Take 1 tablet (10 mg total) by mouth daily. 90 tablet 2  . fluticasone (FLONASE) 50 MCG/ACT nasal spray Place 2 sprays into both nostrils daily. 16 g 2  . furosemide (LASIX) 20 MG tablet Take 1 tablet (20 mg total) by mouth daily. 30 tablet 0  . isosorbide mononitrate (IMDUR) 30 MG 24 hr tablet Take 1 tablet (30 mg total) by mouth daily. 30 tablet 6  . losartan (COZAAR) 100 MG tablet Take 1 tablet (100 mg total) by mouth daily. 90 tablet 1  . metoprolol tartrate (LOPRESSOR) 25 MG tablet Take 0.5 tablets (12.5 mg total) by mouth 2 (two) times daily. 180 tablet 2  . nitroGLYCERIN (NITROSTAT) 0.4 MG SL tablet Place 1 tablet (0.4 mg total) under the tongue every 5 (five) minutes as needed for chest pain. 50 tablet 1  . ondansetron (ZOFRAN) 4 MG tablet Take 1 tablet (4 mg total) by mouth every 8 (eight) hours as needed for nausea or vomiting. 30 tablet 2  . pantoprazole (PROTONIX) 40 MG tablet Take 1 tablet (40 mg total) by mouth daily. 30 tablet 6  . promethazine (PHENERGAN) 12.5 MG tablet Take 12.5 mg by mouth every 6 (six) hours as needed for nausea or vomiting.    . ranitidine (ZANTAC) 150 MG tablet Take 1 tablet (150 mg total) by mouth at bedtime. 30 tablet 5  . traZODone (DESYREL) 50 MG tablet Take 0.5-1 tablets (25-50 mg total) by mouth at bedtime as needed for sleep. 30 tablet 3  . cyclobenzaprine (FLEXERIL) 10 MG tablet Take 1 tablet (10 mg total) by mouth 3 (three) times daily as needed for muscle spasms. 20 tablet 0  . methylPREDNISolone (MEDROL DOSEPAK) 4 MG  TBPK tablet Follow instructions on package. 21 tablet 0   No current facility-administered medications for this visit.    No Known Allergies Social History   Social History  . Marital status: Married    Spouse name: N/A  . Number of children: 2  . 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 Smoking status: Former Smoker    Packs/day: 0.25    Years: 12.00    Types: Cigarettes    Quit date: 11/15/2016  . Smokeless tobacco: Never Used  . Alcohol use Yes     Comment: 12/07/2016 "maybe 3 mixed drinks/month"  .  Drug use: No  . Sexual activity: Yes   Other Topics Concern  . Not on file   Social History Narrative   HSG   Culinary school in [place]   Married - '84 - 5 years divorced; remarried '96   1 son - '85                 Objective:  VITAL SIGNS: BP 120/82 (BP Location: Left Arm, Patient Position: Sitting, Cuff Size: Large)   Pulse 66   Temp 98.3 F (36.8 C) (Oral)   Ht 5\' 10"  (1.778 m)   Wt 240 lb 4 oz (109 kg)   SpO2 95%   BMI 34.47 kg/m  Constitutional: Well formed, well developed. No acute distress. HENT: Normocephalic, atraumatic.  Moist mucous membranes.  Eyes:  EOM grossly intact. Conjunctiva normal.  Neck:  Full range of motion.   Cardiovascular: RRR, no murmurs Thorax & Lungs:  CTAB, no wheezing or rales  Abd: BS+, soft, NT, ND Extremities: No clubbing. No cyanosis. No edema.  Skin: Warm. Dry. No erythema. No rash.  Musculoskeletal: low back.   Tenderness to palpation: yes- over L erector spinae muscle group Deformity: no Ecchymosis: no Straight leg test: negative for Neurologic: Normal sensory function. No focal deficits noted.  Psychiatric: Normal mood. Age appropriate judgment and insight. Alert & oriented x 3.    Assessment:  Acute left-sided low back pain without sciatica - Plan: methylPREDNISolone (MEDROL DOSEPAK) 4 MG TBPK tablet, cyclobenzaprine (FLEXERIL) 10 MG  tablet  Nausea - Plan: ondansetron (ZOFRAN) 4 MG tablet  Plan: Orders as above. UA neg, no fever, likely not stone or infxn.  No NSAIDs, will call in steroid and msc relaxant. Home stretches/exercises. Heat. Tylenol. F/u prn. The patient voiced understanding and agreement to the plan.   [place], DO 02/23/17  3:28 PM

## 2017-02-23 NOTE — Progress Notes (Signed)
Pre visit review using our clinic review tool, if applicable. No additional management support is needed unless otherwise documented below in the visit note. 

## 2017-02-23 NOTE — Patient Instructions (Addendum)
Heat (pad or rice pillow in microwave) over affected area, 10-15 minutes every 2-3 hours while awake.   OK to take Tylenol 1000 mg (2 extra strength tabs) or 975 mg (3 regular strength tabs) every 6 hours as needed.  Take Flexeril (cyclobenzaprine) 1-2 hours before planned bedtime. If it makes you drowsy, do not take during the day. You can try half a tab the following night.  EXERCISES  RANGE OF MOTION (ROM) AND STRETCHING EXERCISES - Low Back Sprain Most people with lower back pain will find that their symptoms get worse with excessive bending forward (flexion) or arching at the lower back (extension). The exercises that will help resolve your symptoms will focus on the opposite motion.  Your physician, physical therapist or athletic trainer will help you determine which exercises will be most helpful to resolve your lower back pain. Do not complete any exercises without first consulting with your caregiver. Discontinue any exercises which make your symptoms worse, until you speak to your caregiver. If you have pain, numbness or tingling which travels down into your buttocks, leg or foot, the goal of the therapy is for these symptoms to move closer to your back and eventually resolve. Sometimes, these leg symptoms will get better, but your lower back pain may worsen. This is often an indication of progress in your rehabilitation. Be very alert to any changes in your symptoms and the activities in which you participated in the 24 hours prior to the change. Sharing this information with your caregiver will allow him or her to most efficiently treat your condition. These exercises may help you when beginning to rehabilitate your injury. Your symptoms may resolve with or without further involvement from your physician, physical therapist or athletic trainer. While completing these exercises, remember:   Restoring tissue flexibility helps normal motion to return to the joints. This allows healthier, less  painful movement and activity.  An effective stretch should be held for at least 30 seconds.  A stretch should never be painful. You should only feel a gentle lengthening or release in the stretched tissue. FLEXION RANGE OF MOTION AND STRETCHING EXERCISES:  STRETCH - Flexion, Single Knee to Chest   Lie on a firm bed or floor with both legs extended in front of you.  Keeping one leg in contact with the floor, bring your opposite knee to your chest. Hold your leg in place by either grabbing behind your thigh or at your knee.  Pull until you feel a gentle stretch in your low back. Hold 15-20 seconds.  Slowly release your grasp and repeat the exercise with the opposite side. Repeat 2 times. Complete this exercise 1-2 times per day.   STRETCH - Flexion, Double Knee to Chest  Lie on a firm bed or floor with both legs extended in front of you.  Keeping one leg in contact with the floor, bring your opposite knee to your chest.  Tense your stomach muscles to support your back and then lift your other knee to your chest. Hold your legs in place by either grabbing behind your thighs or at your knees.  Pull both knees toward your chest until you feel a gentle stretch in your low back. Hold 15-20 seconds.  Tense your stomach muscles and slowly return one leg at a time to the floor. Repeat 2 times. Complete this exercise 1-2 times per day.   STRETCH - Low Trunk Rotation  Lie on a firm bed or floor. Keeping your legs in front of  you, bend your knees so they are both pointed toward the ceiling and your feet are flat on the floor.  Extend your arms out to the side. This will stabilize your upper body by keeping your shoulders in contact with the floor.  Gently and slowly drop both knees together to one side until you feel a gentle stretch in your low back. Hold for 15-20 seconds.  Tense your stomach muscles to support your lower back as you bring your knees back to the starting position. Repeat  the exercise to the other side. Repeat 2 times. Complete this exercise 1-2 times per day  EXTENSION RANGE OF MOTION AND FLEXIBILITY EXERCISES:  STRETCH - Extension, Prone on Elbows   Lie on your stomach on the floor, a bed will be too soft. Place your palms about shoulder width apart and at the height of your head.  Place your elbows under your shoulders. If this is too painful, stack pillows under your chest.  Allow your body to relax so that your hips drop lower and make contact more completely with the floor.  Hold this position for 15-20 seconds.  Slowly return to lying flat on the floor. Repeat 2 times. Complete this exercise 1-2 times per day.   RANGE OF MOTION - Extension, Prone Press Ups  Lie on your stomach on the floor, a bed will be too soft. Place your palms about shoulder width apart and at the height of your head.  Keeping your back as relaxed as possible, slowly straighten your elbows while keeping your hips on the floor. You may adjust the placement of your hands to maximize your comfort. As you gain motion, your hands will come more underneath your shoulders.  Hold this position 15-20 seconds.  Slowly return to lying flat on the floor. Repeat 2 times. Complete this exercise 1-2 times per day.   RANGE OF MOTION- Quadruped, Neutral Spine   Assume a hands and knees position on a firm surface. Keep your hands under your shoulders and your knees under your hips. You may place padding under your knees for comfort.  Drop your head and point your tailbone toward the ground below you. This will round out your lower back like an angry cat. Hold this position for 15-20 seconds.  Slowly lift your head and release your tail bone so that your back sags into a large arch, like an old horse.  Hold this position for 15-20 seconds.  Repeat this until you feel limber in your low back.  Now, find your "sweet spot." This will be the most comfortable position somewhere between the  two previous positions. This is your neutral spine. Once you have found this position, tense your stomach muscles to support your low back.  Hold this position for 15-20 seconds. Repeat 2 times. Complete this exercise 1-2 times per day.  STRENGTHENING EXERCISES - Low Back Sprain These exercises may help you when beginning to rehabilitate your injury. These exercises should be done near your "sweet spot." This is the neutral, low-back arch, somewhere between fully rounded and fully arched, that is your least painful position. When performed in this safe range of motion, these exercises can be used for people who have either a flexion or extension based injury. These exercises may resolve your symptoms with or without further involvement from your physician, physical therapist or athletic trainer. While completing these exercises, remember:   Muscles can gain both the endurance and the strength needed for everyday activities through controlled exercises.  Complete these exercises as instructed by your physician, physical therapist or athletic trainer. Increase the resistance and repetitions only as guided.  You may experience muscle soreness or fatigue, but the pain or discomfort you are trying to eliminate should never worsen during these exercises. If this pain does worsen, stop and make certain you are following the directions exactly. If the pain is still present after adjustments, discontinue the exercise until you can discuss the trouble with your caregiver.  STRENGTHENING - Deep Abdominals, Pelvic Tilt   Lie on a firm bed or floor. Keeping your legs in front of you, bend your knees so they are both pointed toward the ceiling and your feet are flat on the floor.  Tense your lower abdominal muscles to press your low back into the floor. This motion will rotate your pelvis so that your tail bone is scooping upwards rather than pointing at your feet or into the floor. With a gentle tension and  even breathing, hold this position for 10-15 seconds. Repeat 2 times. Complete this exercise 1 time per day.   STRENGTHENING - Abdominals, Crunches   Lie on a firm bed or floor. Keeping your legs in front of you, bend your knees so they are both pointed toward the ceiling and your feet are flat on the floor. Cross your arms over your chest.  Slightly tip your chin down without bending your neck.  Tense your abdominals and slowly lift your trunk high enough to just clear your shoulder blades. Lifting higher can put excessive stress on the lower back and does not further strengthen your abdominal muscles.  Control your return to the starting position. Repeat 2 times. Complete this exercise once every 1-2 days.   STRENGTHENING - Quadruped, Opposite UE/LE Lift   Assume a hands and knees position on a firm surface. Keep your hands under your shoulders and your knees under your hips. You may place padding under your knees for comfort.  Find your neutral spine and gently tense your abdominal muscles so that you can maintain this position. Your shoulders and hips should form a rectangle that is parallel with the floor and is not twisted.  Keeping your trunk steady, lift your right hand no higher than your shoulder and then your left leg no higher than your hip. Make sure you are not holding your breath. Hold this position for 15-20 seconds.  Continuing to keep your abdominal muscles tense and your back steady, slowly return to your starting position. Repeat with the opposite arm and leg. Repeat 2 times. Complete this exercise once every 1-2 days.   STRENGTHENING - Abdominals and Quadriceps, Straight Leg Raise   Lie on a firm bed or floor with both legs extended in front of you.  Keeping one leg in contact with the floor, bend the other knee so that your foot can rest flat on the floor.  Find your neutral spine, and tense your abdominal muscles to maintain your spinal position throughout the  exercise.  Slowly lift your straight leg off the floor about 6 inches for a count of 15, making sure to not hold your breath.  Still keeping your neutral spine, slowly lower your leg all the way to the floor. Repeat this exercise with each leg 2 times. Complete this exercise once every 1-2 days. POSTURE AND BODY MECHANICS CONSIDERATIONS - Low Back Sprain Keeping correct posture when sitting, standing or completing your activities will reduce the stress put on different body tissues, allowing injured tissues a  chance to heal and limiting painful experiences. The following are general guidelines for improved posture. Your physician or physical therapist will provide you with any instructions specific to your needs. While reading these guidelines, remember:  The exercises prescribed by your provider will help you have the flexibility and strength to maintain correct postures.  The correct posture provides the best environment for your joints to work. All of your joints have less wear and tear when properly supported by a spine with good posture. This means you will experience a healthier, less painful body.  Correct posture must be practiced with all of your activities, especially prolonged sitting and standing. Correct posture is as important when doing repetitive low-stress activities (typing) as it is when doing a single heavy-load activity (lifting).  RESTING POSITIONS Consider which positions are most painful for you when choosing a resting position. If you have pain with flexion-based activities (sitting, bending, stooping, squatting), choose a position that allows you to rest in a less flexed posture. You would want to avoid curling into a fetal position on your side. If your pain worsens with extension-based activities (prolonged standing, working overhead), avoid resting in an extended position such as sleeping on your stomach. Most people will find more comfort when they rest with their spine  in a more neutral position, neither too rounded nor too arched. Lying on a non-sagging bed on your side with a pillow between your knees, or on your back with a pillow under your knees will often provide some relief. Keep in mind, being in any one position for a prolonged period of time, no matter how correct your posture, can still lead to stiffness. PROPER SITTING POSTURE In order to minimize stress and discomfort on your spine, you must sit with correct posture. Sitting with good posture should be effortless for a healthy body. Returning to good posture is a gradual process. Many people can work toward this most comfortably by using various supports until they have the flexibility and strength to maintain this posture on their own. When sitting with proper posture, your ears will fall over your shoulders and your shoulders will fall over your hips. You should use the back of the chair to support your upper back. Your lower back will be in a neutral position, just slightly arched. You may place a small pillow or folded towel at the base of your lower back for  support.  When working at a desk, create an environment that supports good, upright posture. Without extra support, muscles tire, which leads to excessive strain on joints and other tissues. Keep these recommendations in mind:  CHAIR:  A chair should be able to slide under your desk when your back makes contact with the back of the chair. This allows you to work closely.  The chair's height should allow your eyes to be level with the upper part of your monitor and your hands to be slightly lower than your elbows.  BODY POSITION  Your feet should make contact with the floor. If this is not possible, use a foot rest.  Keep your ears over your shoulders. This will reduce stress on your neck and low back.  INCORRECT SITTING POSTURES  If you are feeling tired and unable to assume a healthy sitting posture, do not slouch or slump. This puts  excessive strain on your back tissues, causing more damage and pain. Healthier options include:  Using more support, like a lumbar pillow.  Switching tasks to something that requires you  to be upright or walking.  Talking a brief walk.  Lying down to rest in a neutral-spine position.  PROLONGED STANDING WHILE SLIGHTLY LEANING FORWARD  When completing a task that requires you to lean forward while standing in one place for a long time, place either foot up on a stationary 2-4 inch high object to help maintain the best posture. When both feet are on the ground, the lower back tends to lose its slight inward curve. If this curve flattens (or becomes too large), then the back and your other joints will experience too much stress, tire more quickly, and can cause pain.  CORRECT STANDING POSTURES Proper standing posture should be assumed with all daily activities, even if they only take a few moments, like when brushing your teeth. As in sitting, your ears should fall over your shoulders and your shoulders should fall over your hips. You should keep a slight tension in your abdominal muscles to brace your spine. Your tailbone should point down to the ground, not behind your body, resulting in an over-extended swayback posture.   INCORRECT STANDING POSTURES  Common incorrect standing postures include a forward head, locked knees and/or an excessive swayback. WALKING Walk with an upright posture. Your ears, shoulders and hips should all line-up.  PROLONGED ACTIVITY IN A FLEXED POSITION When completing a task that requires you to bend forward at your waist or lean over a low surface, try to find a way to stabilize 3 out of 4 of your limbs. You can place a hand or elbow on your thigh or rest a knee on the surface you are reaching across. This will provide you more stability, so that your muscles do not tire as quickly. By keeping your knees relaxed, or slightly bent, you will also reduce stress across  your lower back. CORRECT LIFTING TECHNIQUES  DO :  Assume a wide stance. This will provide you more stability and the opportunity to get as close as possible to the object which you are lifting.  Tense your abdominals to brace your spine. Bend at the knees and hips. Keeping your back locked in a neutral-spine position, lift using your leg muscles. Lift with your legs, keeping your back straight.  Test the weight of unknown objects before attempting to lift them.  Try to keep your elbows locked down at your sides in order get the best strength from your shoulders when carrying an object.     Always ask for help when lifting heavy or awkward objects. INCORRECT LIFTING TECHNIQUES DO NOT:   Lock your knees when lifting, even if it is a small object.  Bend and twist. Pivot at your feet or move your feet when needing to change directions.  Assume that you can safely pick up even a paperclip without proper posture.

## 2017-03-25 ENCOUNTER — Other Ambulatory Visit: Payer: Self-pay | Admitting: Family

## 2017-03-28 ENCOUNTER — Ambulatory Visit: Payer: BLUE CROSS/BLUE SHIELD | Admitting: Cardiology

## 2017-03-29 ENCOUNTER — Ambulatory Visit (INDEPENDENT_AMBULATORY_CARE_PROVIDER_SITE_OTHER): Payer: BLUE CROSS/BLUE SHIELD | Admitting: Cardiology

## 2017-03-29 ENCOUNTER — Other Ambulatory Visit: Payer: Self-pay

## 2017-03-29 ENCOUNTER — Emergency Department (HOSPITAL_BASED_OUTPATIENT_CLINIC_OR_DEPARTMENT_OTHER): Payer: BLUE CROSS/BLUE SHIELD

## 2017-03-29 ENCOUNTER — Encounter (HOSPITAL_BASED_OUTPATIENT_CLINIC_OR_DEPARTMENT_OTHER): Payer: Self-pay

## 2017-03-29 ENCOUNTER — Encounter: Payer: Self-pay | Admitting: Cardiology

## 2017-03-29 ENCOUNTER — Emergency Department (HOSPITAL_BASED_OUTPATIENT_CLINIC_OR_DEPARTMENT_OTHER)
Admission: EM | Admit: 2017-03-29 | Discharge: 2017-03-29 | Disposition: A | Discharge status: Home / Self Care | Payer: BLUE CROSS/BLUE SHIELD | Attending: Emergency Medicine | Admitting: Emergency Medicine

## 2017-03-29 VITALS — BP 124/74 | HR 75 | Ht 70.0 in | Wt 238.0 lb

## 2017-03-29 DIAGNOSIS — E785 Hyperlipidemia, unspecified: Secondary | ICD-10-CM

## 2017-03-29 DIAGNOSIS — I252 Old myocardial infarction: Secondary | ICD-10-CM | POA: Diagnosis not present

## 2017-03-29 DIAGNOSIS — I5022 Chronic systolic (congestive) heart failure: Secondary | ICD-10-CM | POA: Insufficient documentation

## 2017-03-29 DIAGNOSIS — I2510 Atherosclerotic heart disease of native coronary artery without angina pectoris: Secondary | ICD-10-CM

## 2017-03-29 DIAGNOSIS — I255 Ischemic cardiomyopathy: Secondary | ICD-10-CM

## 2017-03-29 DIAGNOSIS — Z23 Encounter for immunization: Secondary | ICD-10-CM | POA: Diagnosis not present

## 2017-03-29 DIAGNOSIS — I214 Non-ST elevation (NSTEMI) myocardial infarction: Secondary | ICD-10-CM | POA: Diagnosis not present

## 2017-03-29 DIAGNOSIS — S61311A Laceration without foreign body of left index finger with damage to nail, initial encounter: Secondary | ICD-10-CM | POA: Diagnosis not present

## 2017-03-29 DIAGNOSIS — E039 Hypothyroidism, unspecified: Secondary | ICD-10-CM | POA: Diagnosis not present

## 2017-03-29 DIAGNOSIS — Y929 Unspecified place or not applicable: Secondary | ICD-10-CM | POA: Diagnosis not present

## 2017-03-29 DIAGNOSIS — Z87891 Personal history of nicotine dependence: Secondary | ICD-10-CM | POA: Insufficient documentation

## 2017-03-29 DIAGNOSIS — I5042 Chronic combined systolic (congestive) and diastolic (congestive) heart failure: Secondary | ICD-10-CM

## 2017-03-29 DIAGNOSIS — J45909 Unspecified asthma, uncomplicated: Secondary | ICD-10-CM | POA: Diagnosis not present

## 2017-03-29 DIAGNOSIS — Z8673 Personal history of transient ischemic attack (TIA), and cerebral infarction without residual deficits: Secondary | ICD-10-CM | POA: Diagnosis not present

## 2017-03-29 DIAGNOSIS — Y990 Civilian activity done for income or pay: Secondary | ICD-10-CM | POA: Diagnosis not present

## 2017-03-29 DIAGNOSIS — W260XXA Contact with knife, initial encounter: Secondary | ICD-10-CM | POA: Diagnosis not present

## 2017-03-29 DIAGNOSIS — Y93G1 Activity, food preparation and clean up: Secondary | ICD-10-CM | POA: Insufficient documentation

## 2017-03-29 DIAGNOSIS — S6982XA Other specified injuries of left wrist, hand and finger(s), initial encounter: Secondary | ICD-10-CM | POA: Diagnosis present

## 2017-03-29 DIAGNOSIS — I110 Hypertensive heart disease with heart failure: Secondary | ICD-10-CM | POA: Insufficient documentation

## 2017-03-29 MED ORDER — AMLODIPINE BESYLATE 2.5 MG PO TABS: 2.5000 mg | ORAL_TABLET | Freq: Every day | ORAL | 3 refills | Status: DC

## 2017-03-29 MED ORDER — OXYCODONE-ACETAMINOPHEN 5-325 MG PO TABS
1.0000 | ORAL_TABLET | Freq: Once | ORAL | Status: AC
  Administered 2017-03-29: 1 via ORAL

## 2017-03-29 MED ORDER — CEPHALEXIN 500 MG PO CAPS: 500.0000 mg | ORAL_CAPSULE | Freq: Two times a day (BID) | ORAL | 0 refills | Status: AC

## 2017-03-29 MED ORDER — LANSOPRAZOLE 30 MG PO CPDR: 30.0000 mg | DELAYED_RELEASE_CAPSULE | Freq: Two times a day (BID) | ORAL | 3 refills | Status: DC

## 2017-03-29 MED ORDER — ROSUVASTATIN CALCIUM 10 MG PO TABS: 10.0000 mg | ORAL_TABLET | Freq: Every day | ORAL | 3 refills | Status: DC

## 2017-03-29 MED ORDER — TETANUS-DIPHTH-ACELL PERTUSSIS 5-2.5-18.5 LF-MCG/0.5 IM SUSP
INTRAMUSCULAR | Status: AC
  Filled 2017-03-29: qty 0.5

## 2017-03-29 MED ORDER — OXYCODONE-ACETAMINOPHEN 5-325 MG PO TABS: 1.0000 | ORAL_TABLET | ORAL | 0 refills | Status: DC | PRN

## 2017-03-29 MED ORDER — OXYCODONE-ACETAMINOPHEN 5-325 MG PO TABS
ORAL_TABLET | ORAL | Status: AC
  Filled 2017-03-29: qty 1

## 2017-03-29 MED ORDER — TETANUS-DIPHTH-ACELL PERTUSSIS 5-2.5-18.5 LF-MCG/0.5 IM SUSP
0.5000 mL | Freq: Once | INTRAMUSCULAR | Status: AC
  Administered 2017-03-29: 0.5 mL via INTRAMUSCULAR

## 2017-03-29 NOTE — Progress Notes (Signed)
Cardiology Office Note    Date:  03/30/2017   ID:  [name], DOB 1961-01-12, MRN [id number]  PCP:  [name], DO  Cardiologist: Dr. Meda Coffee  Chief Complaint  Patient presents with  . Appointment    has recently had some chest discomfort    History of Present Illness:  [name] is a 56 y.o. male history of CAD status post NSTEMI treated with DES to the circumflex 11/2016 with irregularity in the LAD and RCA but no obstructive lesions. His cath also suggested acute diastolic CHF EF 49% and LVEDP 24. Relook cardiac catheterization for chest pain 12/06/16 showed mild to moderate nonobstructive coronary disease consistent with cath 11/27/16 with widely patent circumflex/OM stent, mildly elevated LV filling pressure 20 mmHg and EF 45%. He was given a dose of IV Lasix without significant improvement in pain. Colchicine was added for possible inflammation but he was unable to afford it. 2-D echo 11/28/16 moderate LVH EF 45-50% with hypokinesis of the basal mid lateral, inferolateral and inferior myocardium. He had post MI chest pain that was felt to be atypical. chronic combined CHF, HTN, HLD, tobacco abuse, GERD, history of drug abuse, TIA  Patient saw Melina Copa, PA-C 12/18/16 and he continued to have atypical chest pain reproducible with palpation and specific movements. EKG was benign. He was advised to take over-the-counter Tylenol as directed. He was told he could stay out of work for 6-8 weeks but he is not sure he wanted to stay out that long.  01/09/17 - Patient comes in today feeling well. He has been exercising but still has occasional chest pain. He says last night when he was walking after he ate dinner he had some pressure that eased with rest. He says it was different from his MI pain. Thinks he is having indigestion. He has gained 10 pounds on his scales, 5 pounds on ours since his last office visit. He is trying to watch his sodium carefully but is drinking a lot  of Sprite and flavored waters. Start cardiac rehabilitation today. Would like to go back to work tomorrow. Started on a steroid Dosepak today for sinus problems by primary care.  02/05/2017 - 1 months follow-up, the patient started to go to cardiac rehabilitation and experiences dizziness after he stops. He is also noticed exertional chest pain that resolves at rest. He has never tried nitroglycerin. He also experiences chest pain on deep inspiration. Has been compliant with his medication. Over the last month he has noticed nosebleeds every day and scheduled for cauterization he has also noticed bright red blood in his stool and went to the ER but no hemorrhoids were identified. He had colonoscopy 2 years ago that was normal. He walks 1/2 miles twice a week and walks a lot at work. He has no side effects from Lipitor. Denies palpitations or falls.  03/29/2017, this is 6 weeks follow-up patient is complaining of chest pain when he is under stress, not on exertion, he states that it resolves with Protonix. He has known esophageal stricture and was supposed to have dilatation however this was postponed because of dual antiplatelet therapy post stent placement. He states that previously Prevacid was more efficient than Protonix. He has been experiencing bilateral hip pain with atorvastatin. He denies any exertional dyspnea. No palpitations no lower extremity edema no syncope.   Past Medical History:  Diagnosis Date  . BPH (benign prostatic hyperplasia)   . CAD (coronary artery disease)    a. NSTEMI troponin >  71 with cath 11/2016 S/p PTCA & DES to mid circumflex; irregularies in LAD & RCA, LVEDP 24, EF 50% and 45-50% by echo.  . Chronic combined systolic and diastolic CHF (congestive heart failure) (HCC)    a. EF 45%.  . Cluster headache    hx of  . Complication of anesthesia    difficult waking up"  . Diverticulosis   . Drug abuse ([place])    hx of  . GERD (gastroesophageal reflux disease)   . Hiatal  hernia   . Hyperlipidemia   . Hypertension   . Hypothyroidism   . Nontoxic multinodular goiter   . NSTEMI (non-ST elevated myocardial infarction) ([place])   . Perforated nasal septum   . Pulmonary nodule --- CT 10/19/2014: Nodule is stable, no further routine x-rays 01/06/2009  . TIA (transient ischemic attack)   . Trigeminal neuralgia   . Tubular adenoma of colon 12/2015  . Unspecified asthma(493.90)     Past Surgical History:  Procedure Laterality Date  . APPENDECTOMY    . CARDIAC CATHETERIZATION N/A 11/25/2014   Procedure: Left Heart Cath and Coronary Angiography;  Surgeon: Belva Crome, MD;  Location: [facility];  Service: Cardiovascular;  Laterality: N/A;  . CARDIAC CATHETERIZATION  12/08/2008   Archie Endo 08/24/2010  . COLECTOMY  ~ 1976   "I had a blockage"  . CORONARY STENT INTERVENTION N/A 11/27/2016   Procedure: CORONARY STENT INTERVENTION;  Surgeon: Belva Crome, MD;  Location: [facility];  Service: Cardiovascular;  Laterality: N/A;  . CORONARY STENT PLACEMENT  11/27/2016   STENT XIENCE ALPINE RX 3.0X15 drug eluting stent was successfully placed  . CYST EXCISION Left 10/31/2016   Jaw  . DESTRUCTION TRIGEMINAL NERVE VIA NEUROLYTIC AGENT  x 3 , R side last ~2010  . KNEE ARTHROSCOPY Right 06/26/2000   Arthroscopy followed by open lateral release/notes 09/06/2010  . LEFT HEART CATH AND CORONARY ANGIOGRAPHY N/A 11/27/2016   Procedure: LEFT HEART CATH AND CORONARY ANGIOGRAPHY;  Surgeon: Belva Crome, MD;  Location: [facility];  Service: Cardiovascular;  Laterality: N/A;  . LEFT HEART CATH AND CORONARY ANGIOGRAPHY N/A 12/07/2016   Procedure: LEFT HEART CATH AND CORONARY ANGIOGRAPHY;  Surgeon: Nelva Bush, MD;  Location: [facility];  Service: Cardiovascular;  Laterality: N/A;  . 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 Ardine Bjork 09/06/2010  . SHOULDER HEMI-ARTHROPLASTY Right 05/26/2010   revision/notes 05/27/2010    Current  Medications: Current Meds  Medication Sig  . aspirin EC 81 MG tablet Take 81 mg by mouth daily.  . chlorhexidine (PERIDEX) 0.12 % solution Use as directed 10 mLs in the mouth or throat 2 (two) times daily. As directed.  . clopidogrel (PLAVIX) 75 MG tablet Take 1 tablet (75 mg total) by mouth daily with breakfast.  . colchicine 0.6 MG tablet Take 1 tablet (0.6 mg total) by mouth 2 (two) times daily.  . cyclobenzaprine (FLEXERIL) 10 MG tablet Take 1 tablet (10 mg total) by mouth 3 (three) times daily as needed for muscle spasms.  Marland Kitchen ezetimibe (ZETIA) 10 MG tablet Take 1 tablet (10 mg total) by mouth daily.  . fluticasone (FLONASE) 50 MCG/ACT nasal spray Place 2 sprays into both nostrils daily.  . furosemide (LASIX) 20 MG tablet Take 1 tablet (20 mg total) by mouth daily.  Marland Kitchen losartan (COZAAR) 100 MG tablet Take 1 tablet (100 mg total) by mouth daily.  . methylPREDNISolone (MEDROL DOSEPAK) 4 MG TBPK tablet Follow instructions on  package.  . metoprolol tartrate (LOPRESSOR) 25 MG tablet Take 0.5 tablets (12.5 mg total) by mouth 2 (two) times daily.  . nitroGLYCERIN (NITROSTAT) 0.4 MG SL tablet Place 1 tablet (0.4 mg total) under the tongue every 5 (five) minutes as needed for chest pain.  Marland Kitchen ondansetron (ZOFRAN) 4 MG tablet Take 1 tablet (4 mg total) by mouth every 8 (eight) hours as needed for nausea or vomiting.  . promethazine (PHENERGAN) 12.5 MG tablet Take 12.5 mg by mouth every 6 (six) hours as needed for nausea or vomiting.  . ranitidine (ZANTAC) 150 MG tablet Take 1 tablet (150 mg total) by mouth at bedtime.  . traZODone (DESYREL) 50 MG tablet Take 0.5-1 tablets (25-50 mg total) by mouth at bedtime as needed for sleep.  . [DISCONTINUED] atorvastatin (LIPITOR) 80 MG tablet Take 1 tablet (80 mg total) by mouth at bedtime.  . [DISCONTINUED] isosorbide mononitrate (IMDUR) 30 MG 24 hr tablet Take 1 tablet (30 mg total) by mouth daily.  . [DISCONTINUED] pantoprazole (PROTONIX) 40 MG tablet Take 1  tablet (40 mg total) by mouth daily.     Allergies:   Patient has no known allergies.   Social History   Socioeconomic History  . Marital status: Married    Spouse name: None  . Number of children: 2  . Years of education: None  . Highest education level: None  Social Needs  . Financial resource strain: None  . Food insecurity - worry: None  . Food insecurity - inability: None  . Transportation needs - medical: None  . Transportation needs - non-medical: None  Occupational History  . Occupation: Statistician  Tobacco Use  . Smoking status: Former Smoker    Packs/day: 0.25    Years: 12.00    Pack years: 3.00    Types: Cigarettes    Last attempt to quit: 11/15/2016    Years since quitting: 0.3  . Smokeless tobacco: Never Used  Substance and Sexual Activity  . Alcohol use: Yes    Comment: 12/07/2016 "maybe 3 mixed drinks/month"  . Drug use: No  . Sexual activity: Yes  Other Topics Concern  . None  Social History Narrative   HSG   [employer] school in [place]   Married - '84 - 5 years divorced; remarried '96   1 son - '85               Family History:  The patient's   family history includes CAD in his father; Cancer in his father; Diabetes in his mother; Heart disease in his father and maternal aunt; Heart failure in his father; Hyperlipidemia in his mother; Hypertension in his father and mother; Mesothelioma in his father; Sudden death in his father.   ROS:   Please see the history of present illness.    Review of Systems  Constitution: Positive for weight gain.  HENT: Positive for congestion.   Cardiovascular: Positive for chest pain and leg swelling.  Respiratory: Negative.   Endocrine: Negative.   Hematologic/Lymphatic: Negative.   Musculoskeletal: Negative.   Gastrointestinal: Positive for heartburn.  Genitourinary: Negative.   Neurological: Negative.    All other systems reviewed and are negative.   PHYSICAL EXAM:   VS:  BP 124/74    Pulse 75   Ht 5\' 10"  (1.778 m)   Wt 238 lb (108 kg)   SpO2 98%   BMI 34.15 kg/m   Physical Exam  GEN: Obese, in no acute distress  Neck: Slight increase JVD,  no carotid bruits, or masses Cardiac:RRR; positive S4 no murmurs, rubs   Respiratory:  clear to auscultation bilaterally, normal work of breathing GI: soft, nontender, nondistended, + BS Ext: Trace of ankle edema without cyanosis, clubbing , Good distal pulses bilaterally Neuro:  Alert and Oriented x 3 Psych: euthymic mood, full affect  Wt Readings from Last 3 Encounters:  03/29/17 240 lb 4.8 oz (109 kg)  03/29/17 238 lb (108 kg)  02/23/17 240 lb 4 oz (109 kg)      Studies/Labs Reviewed:   EKG:  EKG is not ordered today.    Recent Labs: 10/09/2016: Magnesium 2.1 11/24/2016: TSH 0.96 12/07/2016: B Natriuretic Peptide 64.6 01/22/2017: ALT 25; BUN 23; Creatinine, Ser 1.05; Hemoglobin 15.6; Platelets 150; Potassium 3.9; Sodium 137   Lipid Panel    Component Value Date/Time   CHOL 248 (H) 01/10/2017 1159   TRIG 44 01/10/2017 1159   HDL 95 01/10/2017 1159   CHOLHDL 2.6 01/10/2017 1159   CHOLHDL 3.8 11/27/2016 0123   VLDL 20 11/27/2016 0123   LDLCALC 144 (H) 01/10/2017 1159   LDLDIRECT 209.8 07/20/2009 0951    Additional studies/ records that were reviewed today include:  2-D echo 8/7/18Study Conclusions   - Left ventricle: The cavity size was normal. There was moderate   concentric hypertrophy. Systolic function was mildly reduced. The   estimated ejection fraction was in the range of 45% to 50%. There   is hypokinesis of the basal-midlateral, inferolateral, and   inferior myocardium. - Right ventricle: The cavity size was mildly dilated. Wall   thickness was normal. - Right atrium: The atrium was mildly to moderately dilated. - Atrial septum: There was increased thickness of the septum,   consistent with lipomatous hypertrophy. - Pulmonic valve: There was trivial regurgitation. - Pulmonary arteries: Systolic  pressure could not be accurately   estimated.  Echocardiogram 02/13/2017 - Left ventricle: The cavity size was normal. Wall thickness was   normal. Systolic function was normal. The estimated ejection   fraction was in the range of 55% to 60%. Wall motion was normal;   there were no regional wall motion abnormalities. There was an   increased relative contribution of atrial contraction to   ventricular filling. Doppler parameters are consistent with   abnormal left ventricular relaxation (grade 1 diastolic   dysfunction).  Impressions:  - Normal study.   ------------------------------------------------------------------- Study data:  Comparison was made to the study of 10/19/2014.  Study status:  Routine.  Procedure:  Transthoracic echocardiography. Image quality was adequate.  Study completion:  There were no complications.          Transthoracic echocardiography.  M-mode, complete 2D, spectral Doppler, and color Doppler.  Birthdate: Patient birthdate: 01-25-1961.  Age:  Patient is 56 yr old.  Sex: Gender: male.    BMI: 32.6 kg/m^2.  Blood pressure:     114/70 Patient status:  Inpatient.  Study date:  Study date: 11/28/2016.  Cardiac catheterization 8/6/18Conclusion   Non-ST elevation myocardial infarction presentation with ongoing chest pain and rising troponin greater than 9.  Total occlusion of the mid circumflex with prolonged pain presentation due to the presence of collaterals.   Successful PCI of the mid circumflex total occlusion reducing the obstruction to less than 20% using a 3.0 Xience Alpine drug-eluting stent to restorie TIMI grade 3 flow. Final postdilatation balloon diameter was 3.5 Dinosaur balloon.  Irregularities are noted in the LAD and right coronary but no significant obstructive lesions are seen.  Acute diastolic heart  failure with LVEF 50%, and EDP 24 mmHg.   RECOMMENDATIONS:    Aspirin and Plavix for at least 6 months and preferably 12.  Aggressive  risk factor modification.    Cardiac cath 8/16/18Conclusion  Conclusions: 1. Mild to moderate, non-obstructive coronary artery disease similar to conclusion of prior catheterization on 11/27/16. 2. Widely patent LCx/OM stent. 3. Mildly elevated left ventricular filling pressure with LVEF 45%.   Recommendations: 1. Continue DAPT with ASA and clopidogrel x 1 year. 2. Start isosorbide mononitrate (as patient was not taking this medication at home after recent hospital discharge). 3. Begin furosemide for possible component of heart failure. 4. If pain continues despite aforementioned interventions, consider empiric treatment for pericarditis, given recent MI.   Nelva Bush, MD [facility] Pager: [phone]       ASSESSMENT:    1. Coronary artery disease involving native coronary artery of native heart without angina pectoris   2. Hyperlipidemia, unspecified hyperlipidemia type   3. NSTEMI (non-ST elevated myocardial infarction) ([place])      PLAN:  In order of problems listed above:  1. CAD status post NSTEMI 11/27/16 treated with DES to the mid circumflex otherwise nonobstructive disease. Follow-up cath for recurrent chest pain 12/07/2016 mild to moderate nonobstructive CAD widely patent complex/OM stent LVEF 45%, repeat echocardiogram in October 2018 showed recovery of LVEF, currently 55-60%. Patient completed cardiac rehabilitation, his chest pains are nonexertional and appeared to be related to his GI problems specifically GERD and esophageal stricture. I will switch Protonix to Prevacid 30 mg by mouth twice a day. I will also discontinue Imdur that is causing him headache and start amlodipine 2.5 mg daily.  2. The patient will most probably need dilatation of his esophagus, he is advised to schedule an appointment 6 months away from his stenting when we would be able to discontinue Plavix for week.  3. Ischemic cardiomyopathy - LVEF 45% after non-STEMI, repeat echo in  October 2018 showed LVEF 55-60% with no regional wall motion abnormalities. Patient is euvolemic.  4. Essential hypertension - well controlled.  6. Hypercholesterolemia. Baseline LDL 209, he was started atorvastatin 80 mg daily. LDL now 144, we will add Zetia 10 mg po daily, he is not tolerating atorvastatin well, will switch to Crestor 10 mg daily and reassess lipids at the next visit.  Medication Adjustments/Labs and Tests Ordered: Current medicines are reviewed at length with the patient today.  Concerns regarding medicines are outlined above.  Medication changes, Labs and Tests ordered today are listed in the Patient Instructions below. Patient Instructions  Medication Instructions:   STOP TAKING IMDUR NOW  STOP TAKING ATORVASTATIN NOW  STOP TAKING PROTONIX NOW  START TAKING AMLODIPINE 2.5 MG BY MOUTH ONCE DAILY  START TAKING PREVACID 30 MG BY MOUTH TWICE DAILY  START TAKING ROSUVASTATIN 10 MG ONCE DAILY--TAKE AT BEDTIME     Follow-Up:  3 MONTHS WITH DR Meda Coffee     If you need a refill on your cardiac medications before your next appointment, please call your pharmacy.      Signed, Ena Dawley, MD  03/30/2017 3:56 PM    [address], [place], [place]  [address] Phone: [phone]; Fax: [phone]

## 2017-03-29 NOTE — ED Notes (Signed)
ED Provider at bedside. 

## 2017-03-29 NOTE — ED Notes (Signed)
Patient transported to X-ray 

## 2017-03-29 NOTE — ED Provider Notes (Signed)
Emergency Department Provider Note   I have reviewed the triage vital signs and the nursing notes.   HISTORY  Chief Complaint Laceration   HPI Mina Carlisi is a 56 y.o. male with PMH of CAD on Plavix and ASA, HLD, and HTN presents to the ED for evaluation of finger injury. The patient works as a Biomedical scientist and sliced the left index finger while at work today. He had a recent AMI and was started on ASA and Plavix. He has injured this finger before and was not going to come in but he could not stop the bleeding.    Past Medical History:  Diagnosis Date  . BPH (benign prostatic hyperplasia)   . CAD (coronary artery disease)    a. NSTEMI troponin >65 with cath 11/2016 S/p PTCA & DES to mid circumflex; irregularies in LAD & RCA, LVEDP 24, EF 50% and 45-50% by echo.  . Chronic combined systolic and diastolic CHF (congestive heart failure) (HCC)    a. EF 45%.  . Cluster headache    hx of  . Complication of anesthesia    difficult waking up"  . Diverticulosis   . Drug abuse ([place])    hx of  . GERD (gastroesophageal reflux disease)   . Hiatal hernia   . Hyperlipidemia   . Hypertension   . Hypothyroidism   . Nontoxic multinodular goiter   . NSTEMI (non-ST elevated myocardial infarction) ([place])   . Perforated nasal septum   . Pulmonary nodule --- CT 10/19/2014: Nodule is stable, no further routine x-rays 01/06/2009  . TIA (transient ischemic attack)   . Trigeminal neuralgia   . Tubular adenoma of colon 12/2015  . Unspecified asthma(493.90)     Patient Active Problem List   Diagnosis Date Noted  . Ischemic cardiomyopathy 02/05/2017  . Obesity 12/08/2016  . Chronic diastolic CHF (congestive heart failure) ([place]) 12/08/2016  . Atypical chest pain 12/07/2016  . NSTEMI (non-ST elevated myocardial infarction) ([place]) 11/26/2016  . Intractable nausea and vomiting 10/09/2016  . Nausea vomiting and diarrhea 10/09/2016  . Enteritis due to Clostridium difficile 10/30/2015  . Malnutrition  of moderate degree 10/21/2015  . Acute kidney injury ([place]) 10/20/2015  . PCP NOTES >>>>>>>>>>>>>>>>> 09/30/2015  . Hypercholesteremia 09/22/2015  . Renal mass, left 11/02/2014  . CAD (coronary artery disease)   . Drug abuse and dependence ([place]) 09/18/2014  . Visit for preventive health examination 02/04/2014  . Screening for ischemic heart disease 02/04/2014  . BPH associated with nocturia 12/24/2013  . Hypothyroidism 12/24/2013  . Chronic ethmoidal sinusitis 12/24/2013  . Low back pain radiating to right leg 05/10/2012  . Thrombocytopenia ([place]) 12/05/2011  . Tobacco use 11/30/2011  . Anxiety 12/26/2010  . Transient cerebral ischemia 01/07/2010  . Asthma 01/19/2009  . Pulmonary nodule --- CT 10/19/2014: Nodule is stable, no further routine x-rays 01/06/2009  . Nontoxic multinodular goiter 12/31/2008  . DYSPNEA ON EXERTION 12/15/2008  . Trigeminal neuralgia 07/24/2007  . Dyslipidemia 12/27/2006  . Cluster headache 12/27/2006  . Essential hypertension 12/27/2006  . GERD 12/27/2006    Past Surgical History:  Procedure Laterality Date  . APPENDECTOMY    . CARDIAC CATHETERIZATION N/A 11/25/2014   Procedure: Left Heart Cath and Coronary Angiography;  Surgeon: Belva Crome, MD;  Location: [facility];  Service: Cardiovascular;  Laterality: N/A;  . CARDIAC CATHETERIZATION  12/08/2008   Archie Endo 08/24/2010  . COLECTOMY  ~ 1976   "I had a blockage"  . CORONARY STENT INTERVENTION N/A 11/27/2016  Procedure: CORONARY STENT INTERVENTION;  Surgeon: Belva Crome, MD;  Location: [facility];  Service: Cardiovascular;  Laterality: N/A;  . CORONARY STENT PLACEMENT  11/27/2016   STENT XIENCE ALPINE RX 3.0X15 drug eluting stent was successfully placed  . CYST EXCISION Left 10/31/2016   Jaw  . DESTRUCTION TRIGEMINAL NERVE VIA NEUROLYTIC AGENT  x 3 , R side last ~2010  . KNEE ARTHROSCOPY Right 06/26/2000   Arthroscopy followed by open lateral release/notes 09/06/2010  . LEFT HEART  CATH AND CORONARY ANGIOGRAPHY N/A 11/27/2016   Procedure: LEFT HEART CATH AND CORONARY ANGIOGRAPHY;  Surgeon: Belva Crome, MD;  Location: [facility];  Service: Cardiovascular;  Laterality: N/A;  . LEFT HEART CATH AND CORONARY ANGIOGRAPHY N/A 12/07/2016   Procedure: LEFT HEART CATH AND CORONARY ANGIOGRAPHY;  Surgeon: Nelva Bush, MD;  Location: [facility];  Service: Cardiovascular;  Laterality: N/A;  . 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 Ardine Bjork 09/06/2010  . SHOULDER HEMI-ARTHROPLASTY Right 05/26/2010   revision/notes 05/27/2010    Current Outpatient Rx  . Order #: [id number] Class: Normal  . Order #: [id number] Class: Historical Med  . Order #: [id number] Class: Print  . Order #: [id number] Class: Historical Med  . Order #: [id number] Class: Normal  . Order #: [id number] Class: Normal  . Order #: [id number] Class: Normal  . Order #: [id number] Class: Normal  . Order #: [id number] Class: Normal  . Order #: [id number] Class: Normal  . Order #: [id number] Class: Normal  . Order #: [id number] Class: Normal  . Order #: [id number] Class: Normal  . Order #: [id number] Class: Normal  . Order #: [id number] Class: Normal  . Order #: [id number] Class: Normal  . Order #: [id number] Class: Print  . Order #: [id number] Class: Historical Med  . Order #: [id number] Class: Normal  . Order #: [id number] Class: Normal  . Order #: [id number] Class: Normal    Allergies Patient has no known allergies.  Family History  Problem Relation Age of Onset  . Diabetes Mother   . Hyperlipidemia Mother   . Hypertension Mother   . Hypertension Father   . Heart disease Father   . Cancer Father   . Sudden death Father        OCT 08/02/09  . CAD Father   . Heart failure Father   . Mesothelioma Father   . Heart disease Maternal Aunt   . Prostate cancer Neg Hx   . Colon cancer Neg Hx     Social History Social History   Tobacco Use  . Smoking status: Former Smoker    Packs/day: 0.25    Years:  12.00    Pack years: 3.00    Types: Cigarettes    Last attempt to quit: 11/15/2016    Years since quitting: 0.3  . Smokeless tobacco: Never Used  Substance Use Topics  . Alcohol use: Yes    Comment: 12/07/2016 "maybe 3 mixed drinks/month"  . Drug use: No    Review of Systems  Constitutional: No fever/chills Eyes: No visual changes. ENT: No sore throat. Cardiovascular: Denies chest pain. Respiratory: Denies shortness of breath. Gastrointestinal: No abdominal pain.  No nausea, no vomiting.  No diarrhea.  No constipation. Genitourinary: Negative for dysuria. Musculoskeletal: Negative for back pain. Positive finger injury and bleeding.  Skin: Negative for rash. Neurological: Negative for headaches, focal weakness or numbness.  10-point ROS otherwise negative.  ____________________________________________   PHYSICAL EXAM:  VITAL SIGNS: ED Triage Vitals  Enc Vitals Group     BP 03/29/17 08/03/2130 (!) 153/99  Pulse Rate 03/29/17 2132 85     Resp 03/29/17 2132 18     Temp 03/29/17 2132 98.7 F (37.1 C)     Temp Source 03/29/17 2132 Oral     SpO2 03/29/17 2132 100 %     Weight 03/29/17 2129 240 lb 4.8 oz (109 kg)     Height 03/29/17 2129 5\' 10"  (1.778 m)     Pain Score 03/29/17 2131 6   Constitutional: Alert and oriented. Well appearing and in no acute distress. Eyes: Conjunctivae are normal.  Head: Atraumatic. Nose: No congestion/rhinnorhea. Mouth/Throat: Mucous membranes are moist.  Oropharynx non-erythematous. Neck: No stridor.  Cardiovascular: Normal rate, regular rhythm. Good peripheral circulation. Grossly normal heart sounds.   Respiratory: Normal respiratory effort.  No retractions. Lungs CTAB. Gastrointestinal: Soft and nontender. No distention.  Musculoskeletal: No lower extremity tenderness nor edema. No gross deformities of extremities. Neurologic:  Normal speech and language. No gross focal neurologic deficits are appreciated.  Skin:  Skin is warm and dry.  Oozing blood from the left index finger. Avulsion injury with no flap. Nail involved but not to the nail bed. No exposed bone.    ____________________________________________  RADIOLOGY  Dg Finger Index Left  Result Date: 03/29/2017 CLINICAL DATA:  56 y/o  M; distal left index finger laceration. EXAM: LEFT INDEX FINGER 2+V COMPARISON:  None. FINDINGS: There is no evidence of fracture or dislocation. There is no evidence of arthropathy or other focal bone abnormality. Extensive soft tissue irregularity of the distal second digit from laceration. No radiopaque foreign body identified. IMPRESSION: Distal second digit soft tissue laceration. No acute fracture or dislocation. No radiopaque foreign body identified. Electronically Signed   By: Kristine Garbe M.D.   On: 03/29/2017 22:39    ____________________________________________   PROCEDURES  Procedure(s) performed:   Procedures  None ____________________________________________   INITIAL IMPRESSION / ASSESSMENT AND PLAN / ED COURSE  Pertinent labs & imaging results that were available during my care of the patient were reviewed by me and considered in my medical decision making (see chart for details).  Patient with finger tip injury into the nail. No exposed bone. Bleeding stopped with pressure. Plain film with no FB or underlying fracture. Plan for wet-to-dry dressing, abx, and hand surgery follow up. Supplies provided and instruction provided at discharge.   At this time, I do not feel there is any life-threatening condition present. I have reviewed and discussed all results (EKG, imaging, lab, urine as appropriate), exam findings with patient. I have reviewed nursing notes and appropriate previous records.  I feel the patient is safe to be discharged home without further emergent workup. Discussed usual and customary return precautions. Patient and family (if present) verbalize understanding and are comfortable with this  plan.  Patient will follow-up with their primary care provider. If they do not have a primary care provider, information for follow-up has been provided to them. All questions have been answered.    ____________________________________________  FINAL CLINICAL IMPRESSION(S) / ED DIAGNOSES  Final diagnoses:  Laceration of left index finger without foreign body with damage to nail, initial encounter     MEDICATIONS GIVEN DURING THIS VISIT:  Medications  Tdap (BOOSTRIX) injection 0.5 mL (0.5 mLs Intramuscular Given 03/29/17 2200)  oxyCODONE-acetaminophen (PERCOCET/ROXICET) 5-325 MG per tablet 1 tablet (1 tablet Oral Given 03/29/17 2206)    Note:  This document was prepared using Dragon voice recognition software and may include unintentional dictation errors.  Nanda Quinton, MD Emergency Medicine  Margette Fast, MD 03/30/17 1007

## 2017-03-29 NOTE — ED Triage Notes (Addendum)
Pt states he cut left index finger on knife at work approx 15 min PTA-bleeding continues-? lac vs shear of skin noted-bulky gauze applied-NAD-steady gait

## 2017-03-29 NOTE — Patient Instructions (Signed)
Medication Instructions:   STOP TAKING IMDUR NOW  STOP TAKING ATORVASTATIN NOW  STOP TAKING PROTONIX NOW  START TAKING AMLODIPINE 2.5 MG BY MOUTH ONCE DAILY  START TAKING PREVACID 30 MG BY MOUTH TWICE DAILY  START TAKING ROSUVASTATIN 10 MG ONCE DAILY--TAKE AT BEDTIME     Follow-Up:  3 MONTHS WITH DR Meda Coffee     If you need a refill on your cardiac medications before your next appointment, please call your pharmacy.

## 2017-03-29 NOTE — Discharge Instructions (Signed)
You were seen in the ED today with a finger injury. We are starting you on an antibiotic and have attached the name and phone number of a hand surgeon that you will need to follow up with ASAP. Call tomorrow to schedule an appointment.   You will need to change the dressing on the finger in 48 hours. Apply a non-stick dressing directly against the wound and then use the materials given to wrap the area. Return to the ED with any bleeding that you are unable to control.

## 2017-03-30 ENCOUNTER — Telehealth: Payer: Self-pay | Admitting: Gastroenterology

## 2017-03-30 NOTE — Telephone Encounter (Signed)
Ok to schedule for Feb wit a previsit after he has completed Plavix rx

## 2017-04-04 NOTE — Telephone Encounter (Signed)
Patient will be able to come off his Plavix in Feb temporarily due to recent MI.  He will come in and discuss EGD on 05/23/17 and tentatively for 06/05/17

## 2017-04-04 NOTE — Telephone Encounter (Signed)
Left message for patient to call back and schedule appointment.

## 2017-04-04 NOTE — Telephone Encounter (Signed)
Patient called back stating that he needs our office to tell him when he can go off of plavix and when to start taking plavix again.

## 2017-04-07 ENCOUNTER — Other Ambulatory Visit: Payer: Self-pay | Admitting: Family Medicine

## 2017-04-07 DIAGNOSIS — Z09 Encounter for follow-up examination after completed treatment for conditions other than malignant neoplasm: Secondary | ICD-10-CM

## 2017-04-11 ENCOUNTER — Emergency Department (HOSPITAL_BASED_OUTPATIENT_CLINIC_OR_DEPARTMENT_OTHER)
Admission: EM | Admit: 2017-04-11 | Discharge: 2017-04-11 | Disposition: A | Discharge status: Home / Self Care | Payer: BLUE CROSS/BLUE SHIELD | Attending: Emergency Medicine | Admitting: Emergency Medicine

## 2017-04-11 ENCOUNTER — Encounter (HOSPITAL_BASED_OUTPATIENT_CLINIC_OR_DEPARTMENT_OTHER): Payer: Self-pay

## 2017-04-11 ENCOUNTER — Other Ambulatory Visit: Payer: Self-pay

## 2017-04-11 DIAGNOSIS — Z79899 Other long term (current) drug therapy: Secondary | ICD-10-CM | POA: Diagnosis not present

## 2017-04-11 DIAGNOSIS — S61311D Laceration without foreign body of left index finger with damage to nail, subsequent encounter: Secondary | ICD-10-CM | POA: Diagnosis present

## 2017-04-11 DIAGNOSIS — J45909 Unspecified asthma, uncomplicated: Secondary | ICD-10-CM | POA: Insufficient documentation

## 2017-04-11 DIAGNOSIS — E039 Hypothyroidism, unspecified: Secondary | ICD-10-CM | POA: Insufficient documentation

## 2017-04-11 DIAGNOSIS — I2510 Atherosclerotic heart disease of native coronary artery without angina pectoris: Secondary | ICD-10-CM | POA: Insufficient documentation

## 2017-04-11 DIAGNOSIS — Z8673 Personal history of transient ischemic attack (TIA), and cerebral infarction without residual deficits: Secondary | ICD-10-CM | POA: Insufficient documentation

## 2017-04-11 DIAGNOSIS — W260XXA Contact with knife, initial encounter: Secondary | ICD-10-CM | POA: Diagnosis not present

## 2017-04-11 DIAGNOSIS — S61301D Unspecified open wound of left index finger with damage to nail, subsequent encounter: Secondary | ICD-10-CM

## 2017-04-11 DIAGNOSIS — Z87891 Personal history of nicotine dependence: Secondary | ICD-10-CM | POA: Insufficient documentation

## 2017-04-11 MED ORDER — BACITRACIN ZINC 500 UNIT/GM EX OINT
1.0000 "application " | TOPICAL_OINTMENT | Freq: Two times a day (BID) | CUTANEOUS | Status: DC
  Administered 2017-04-11: 1 via TOPICAL

## 2017-04-11 MED ORDER — BUPIVACAINE HCL 0.25 % IJ SOLN
10.0000 mL | Freq: Once | INTRAMUSCULAR | Status: AC
  Administered 2017-04-11: 10 mL
  Filled 2017-04-11: qty 1

## 2017-04-11 NOTE — ED Provider Notes (Signed)
[facility] EMERGENCY DEPARTMENT Provider Note   CSN: [id number] Arrival date & time: 04/11/17  1943     History   Chief Complaint Chief Complaint  Patient presents with  . Hand Pain    HPI [name] is a 56 y.o. male.  56yo M w/ PMH including CAD s/p MI, CHF, TIA who p/w finger injury.  The patient presented here on 12/6 after he cut the tip of his left index finger with a knife.  The wound was cared for in the ED and he was started on a course of antibiotics.  Initially it seemed to be healing okay and then it began hurting more so he has been to the clinic for Worker's Comp 4 times. 3 days ago they started him on doxycycline due to drainage that was concerning for infection. They saw him again today for persistent pain and referred him to ED. he states that the pain is severe and constant and radiates down his finger into his thumb.  No fevers.  No history of diabetes. He reports swelling and pain has not improved on antibiotics.    The history is provided by the patient.  Hand Pain     Past Medical History:  Diagnosis Date  . BPH (benign prostatic hyperplasia)   . CAD (coronary artery disease)    a. NSTEMI troponin >65 with cath 11/2016 S/p PTCA & DES to mid circumflex; irregularies in LAD & RCA, LVEDP 24, EF 50% and 45-50% by echo.  . Chronic combined systolic and diastolic CHF (congestive heart failure) (HCC)    a. EF 45%.  . Cluster headache    hx of  . Complication of anesthesia    difficult waking up"  . Diverticulosis   . Drug abuse ([place])    hx of  . GERD (gastroesophageal reflux disease)   . Hiatal hernia   . Hyperlipidemia   . Hypertension   . Hypothyroidism   . Nontoxic multinodular goiter   . NSTEMI (non-ST elevated myocardial infarction) ([place])   . Perforated nasal septum   . Pulmonary nodule --- CT 10/19/2014: Nodule is stable, no further routine x-rays 01/06/2009  . TIA (transient ischemic attack)   . Trigeminal neuralgia   . Tubular  adenoma of colon 12/2015  . Unspecified asthma(493.90)     Patient Active Problem List   Diagnosis Date Noted  . Ischemic cardiomyopathy 02/05/2017  . Obesity 12/08/2016  . Chronic diastolic CHF (congestive heart failure) ([place]) 12/08/2016  . Atypical chest pain 12/07/2016  . NSTEMI (non-ST elevated myocardial infarction) ([place]) 11/26/2016  . Intractable nausea and vomiting 10/09/2016  . Nausea vomiting and diarrhea 10/09/2016  . Enteritis due to Clostridium difficile 10/30/2015  . Malnutrition of moderate degree 10/21/2015  . Acute kidney injury ([place]) 10/20/2015  . PCP NOTES >>>>>>>>>>>>>>>>> 09/30/2015  . Hypercholesteremia 09/22/2015  . Renal mass, left 11/02/2014  . CAD (coronary artery disease)   . Drug abuse and dependence ([place]) 09/18/2014  . Visit for preventive health examination 02/04/2014  . Screening for ischemic heart disease 02/04/2014  . BPH associated with nocturia 12/24/2013  . Hypothyroidism 12/24/2013  . Chronic ethmoidal sinusitis 12/24/2013  . Low back pain radiating to right leg 05/10/2012  . Thrombocytopenia ([place]) 12/05/2011  . Tobacco use 11/30/2011  . Anxiety 12/26/2010  . Transient cerebral ischemia 01/07/2010  . Asthma 01/19/2009  . Pulmonary nodule --- CT 10/19/2014: Nodule is stable, no further routine x-rays 01/06/2009  . Nontoxic multinodular goiter 12/31/2008  . DYSPNEA ON EXERTION  12/15/2008  . Trigeminal neuralgia 07/24/2007  . Dyslipidemia 12/27/2006  . Cluster headache 12/27/2006  . Essential hypertension 12/27/2006  . GERD 12/27/2006    Past Surgical History:  Procedure Laterality Date  . APPENDECTOMY    . CARDIAC CATHETERIZATION N/A 11/25/2014   Procedure: Left Heart Cath and Coronary Angiography;  Surgeon: Belva Crome, MD;  Location: [facility];  Service: Cardiovascular;  Laterality: N/A;  . CARDIAC CATHETERIZATION  12/08/2008   Archie Endo 08/24/2010  . COLECTOMY  ~ 1976   "I had a blockage"  . CORONARY STENT INTERVENTION N/A  11/27/2016   Procedure: CORONARY STENT INTERVENTION;  Surgeon: Belva Crome, MD;  Location: [facility];  Service: Cardiovascular;  Laterality: N/A;  . CORONARY STENT PLACEMENT  11/27/2016   STENT XIENCE ALPINE RX 3.0X15 drug eluting stent was successfully placed  . CYST EXCISION Left 10/31/2016   Jaw  . DESTRUCTION TRIGEMINAL NERVE VIA NEUROLYTIC AGENT  x 3 , R side last ~2010  . KNEE ARTHROSCOPY Right 06/26/2000   Arthroscopy followed by open lateral release/notes 09/06/2010  . LEFT HEART CATH AND CORONARY ANGIOGRAPHY N/A 11/27/2016   Procedure: LEFT HEART CATH AND CORONARY ANGIOGRAPHY;  Surgeon: Belva Crome, MD;  Location: [facility];  Service: Cardiovascular;  Laterality: N/A;  . LEFT HEART CATH AND CORONARY ANGIOGRAPHY N/A 12/07/2016   Procedure: LEFT HEART CATH AND CORONARY ANGIOGRAPHY;  Surgeon: Nelva Bush, MD;  Location: [facility];  Service: Cardiovascular;  Laterality: N/A;  . 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 Ardine Bjork 09/06/2010  . SHOULDER HEMI-ARTHROPLASTY Right 05/26/2010   revision/notes 05/27/2010       Home Medications    Prior to Admission medications   Medication Sig Start Date End Date Taking? Authorizing Provider  amLODipine (NORVASC) 2.5 MG tablet Take 1 tablet (2.5 mg total) by mouth daily. 03/29/17 06/27/17  Dorothy Spark, MD  aspirin EC 81 MG tablet Take 81 mg by mouth daily.    [provider]  chlorhexidine (PERIDEX) 0.12 % solution Use as directed 10 mLs in the mouth or throat 2 (two) times daily. As directed. 10/23/16   [provider]  clopidogrel (PLAVIX) 75 MG tablet Take 1 tablet (75 mg total) by mouth daily with breakfast. 11/30/16   Bhagat, Bhavinkumar, PA  colchicine 0.6 MG tablet Take 1 tablet (0.6 mg total) by mouth 2 (two) times daily. 12/08/16   Dunn, Nedra Hai, PA-C  cyclobenzaprine (FLEXERIL) 10 MG tablet Take 1 tablet (10 mg total) by mouth 3 (three) times daily as needed for  muscle spasms. 02/23/17   [name], DO  ezetimibe (ZETIA) 10 MG tablet Take 1 tablet (10 mg total) by mouth daily. 02/05/17 05/06/17  Dorothy Spark, MD  fluticasone (FLONASE) 50 MCG/ACT nasal spray Place 2 sprays into both nostrils daily. 01/05/17   [name], DO  furosemide (LASIX) 20 MG tablet Take 1 tablet (20 mg total) by mouth daily. 01/09/17 04/09/17  Imogene Burn, PA-C  lansoprazole (PREVACID) 30 MG capsule Take 1 capsule (30 mg total) by mouth 2 (two) times daily before a meal. 03/29/17   Dorothy Spark, MD  losartan (COZAAR) 100 MG tablet TAKE 1 TABLET BY MOUTH DAILY 04/09/17   [name], DO  methylPREDNISolone (MEDROL DOSEPAK) 4 MG TBPK tablet Follow instructions on package. 02/23/17   [name], DO  metoprolol tartrate (LOPRESSOR) 25 MG tablet Take 0.5 tablets (12.5 mg total) by mouth  2 (two) times daily. 02/05/17 05/06/17  Dorothy Spark, MD  nitroGLYCERIN (NITROSTAT) 0.4 MG SL tablet Place 1 tablet (0.4 mg total) under the tongue every 5 (five) minutes as needed for chest pain. 12/06/16   [name], DO  ondansetron (ZOFRAN) 4 MG tablet Take 1 tablet (4 mg total) by mouth every 8 (eight) hours as needed for nausea or vomiting. 02/23/17   [name], DO  oxyCODONE-acetaminophen (PERCOCET/ROXICET) 5-325 MG tablet Take 1 tablet by mouth every 4 (four) hours as needed for severe pain. 03/29/17   Long, Wonda Olds, MD  promethazine (PHENERGAN) 12.5 MG tablet Take 12.5 mg by mouth every 6 (six) hours as needed for nausea or vomiting.    [provider]  ranitidine (ZANTAC) 150 MG tablet Take 1 tablet (150 mg total) by mouth at bedtime. 11/13/16 11/13/17  Ladene Artist, MD  rosuvastatin (CRESTOR) 10 MG tablet Take 1 tablet (10 mg total) by mouth daily. 03/29/17 06/27/17  Dorothy Spark, MD  traZODone (DESYREL) 50 MG tablet Take 0.5-1 tablets (25-50 mg total) by mouth at bedtime as needed for sleep.  09/05/16   Debbrah Alar, NP    Family History Family History  Problem Relation Age of Onset  . Diabetes Mother   . Hyperlipidemia Mother   . Hypertension Mother   . Hypertension Father   . Heart disease Father   . Cancer Father   . Sudden death Father        OCT August 11, 2009  . CAD Father   . Heart failure Father   . Mesothelioma Father   . Heart disease Maternal Aunt   . Prostate cancer Neg Hx   . Colon cancer Neg Hx     Social History Social History   Tobacco Use  . Smoking status: Former Smoker    Packs/day: 0.25    Years: 12.00    Pack years: 3.00    Types: Cigarettes    Last attempt to quit: 11/15/2016    Years since quitting: 0.4  . Smokeless tobacco: Never Used  Substance Use Topics  . Alcohol use: Yes    Comment: occ  . Drug use: No     Allergies   Patient has no known allergies.   Review of Systems Review of Systems  Constitutional: Negative for fever.  Musculoskeletal: Positive for joint swelling.  Skin: Positive for wound.  Neurological: Negative for numbness.  All other systems reviewed and are negative.    Physical Exam Updated Vital Signs BP (!) 145/94 (BP Location: Left Arm)   Pulse 80   Temp 98.1 F (36.7 C) (Oral)   Resp 18   Ht 5\' 10"  (1.778 m)   Wt 107.5 kg (237 lb)   SpO2 96%   BMI 34.01 kg/m   Physical Exam  Constitutional: He is oriented to person, place, and time. He appears well-developed and well-nourished. No distress.  HENT:  Head: Normocephalic and atraumatic.  Eyes: Conjunctivae are normal.  Neck: Neck supple.  Musculoskeletal: Normal range of motion. He exhibits tenderness.  No tenderness of flexor tendon sheath of L index finger but tenderness of palmar fingertip  Neurological: He is alert and oriented to person, place, and time.  Skin: Skin is warm and dry.  See photo  Psychiatric: He has a normal mood and affect. Judgment normal.  Nursing note and vitals reviewed.       ED Treatments / Results   Labs (all labs ordered are listed, but only abnormal results are  displayed) Labs Reviewed - No data to display  EKG  EKG Interpretation None       Radiology No results found.  Procedures .Nerve Block Date/Time: 04/11/2017 10:38 PM Performed by: Sharlett Iles, MD Authorized by: Sharlett Iles, MD   Consent:    Consent obtained:  Verbal   Consent given by:  Patient Indications:    Indications:  Pain relief Location:    Body area:  Upper extremity   Upper extremity nerve blocked: index finger- digital nerve.   Laterality:  Left Pre-procedure details:    Skin preparation:  Alcohol Procedure details (see MAR for exact dosages):    Block needle gauge:  25 G   Anesthetic injected:  Bupivacaine 0.25% w/o epi   Injection procedure:  Anatomic landmarks identified, incremental injection, introduced needle, anatomic landmarks palpated and negative aspiration for blood Post-procedure details:    Dressing:  Sterile dressing   Outcome:  Pain relieved   Patient tolerance of procedure:  Tolerated well, no immediate complications   (including critical care time)  Medications Ordered in ED Medications  bacitracin ointment 1 application (not administered)  bupivacaine (MARCAINE) 0.25 % (with pres) injection 10 mL (10 mLs Infiltration Given 04/11/17 2120)     Initial Impression / Assessment and Plan / ED Course  I have reviewed the triage vital signs and the nursing notes.    Worsening pain of L index fingertip after cutting with knife 2 weeks ago. No signs of flexor tenosynovitis on exam.  He does have a persistent wound, difficult to assess whether there was drainage as finger already coated with bacitracin prior to my exam. I do not appreciate any signs of deep space infection. Performed digital nerve block for pain relief. Discussed with hand surgeon, Dr. Lenon Curt, appreciate assistance. He will follow pt in clinic. Pt to continue doxycycline course.  Placed in  splint for immobilization.  Discussed wound care instructions.  Extensively reviewed return precautions.  Patient voiced understanding. Final Clinical Impressions(s) / ED Diagnoses   Final diagnoses:  Open wound of left index finger with damage to nail, subsequent encounter    ED Discharge Orders    None       Little, Wenda Overland, MD 04/11/17 2242

## 2017-04-11 NOTE — Discharge Instructions (Signed)
KEEP TAKING DOXYCYLINE AS PRESCRIBED UNTIL IT IS COMPLETED. KEEP FINGER IN SPLINT UNLESS CHANGING BANDAGE/CLEANING WOUND. KEEP WOUND CLEAN AND DRY, ALWAYS CHECK BANDAGE AT LEAST ONCE DAILY, AND KEEP ELEVATED AS MUCH AS POSSIBLE. CONTACT DR. Brennan Bailey OFFICE FOR FOLLOW UP. RETURN TO ER IF ANY WORSENING DRAINAGE, WORSENING SWELLING, OR SEVERE PAIN.

## 2017-04-11 NOTE — ED Triage Notes (Signed)
Pt injured left index finger 12/6-states he feels is it not healing well-has been seen by [facility] x 4-last visit today and was advised to come to ED-NAD-steady gait

## 2017-04-13 ENCOUNTER — Telehealth: Payer: Self-pay | Admitting: Cardiology

## 2017-04-13 NOTE — Telephone Encounter (Signed)
Patient stated he has a laceration to his left index finger and was instructed to take an antiinflammatory to help with pain but he should discuss with MD prior to taking motrin. Patient states that tylenol doesn't help. I informed patient I would send to Dr. Meda Coffee for further recommendations. Patient verbalized understanding and thanked me for the call.

## 2017-04-13 NOTE — Telephone Encounter (Signed)
Pt would like to know weather he can take MOTRIN pain for a little while.

## 2017-04-20 NOTE — Telephone Encounter (Signed)
Spoke with the pt and informed him that per Dr Meda Coffee, short term use (less than 1 week) of anti-inflammatories is ok.  Pt verbalized understanding and agrees with this plan.

## 2017-04-20 NOTE — Telephone Encounter (Signed)
Short term = less than 1 week is ok

## 2017-05-23 ENCOUNTER — Encounter: Payer: Self-pay | Admitting: Gastroenterology

## 2017-05-23 ENCOUNTER — Telehealth: Payer: Self-pay

## 2017-05-23 ENCOUNTER — Ambulatory Visit: Payer: BLUE CROSS/BLUE SHIELD | Admitting: Gastroenterology

## 2017-05-23 VITALS — BP 120/84 | HR 72 | Ht 70.0 in | Wt 243.0 lb

## 2017-05-23 DIAGNOSIS — R079 Chest pain, unspecified: Secondary | ICD-10-CM

## 2017-05-23 DIAGNOSIS — R1310 Dysphagia, unspecified: Secondary | ICD-10-CM

## 2017-05-23 DIAGNOSIS — K219 Gastro-esophageal reflux disease without esophagitis: Secondary | ICD-10-CM | POA: Diagnosis not present

## 2017-05-23 DIAGNOSIS — R1013 Epigastric pain: Secondary | ICD-10-CM | POA: Diagnosis not present

## 2017-05-23 NOTE — Patient Instructions (Signed)
You have been scheduled for an endoscopy. Please follow written instructions given to you at your visit today. If you use inhalers (even only as needed), please bring them with you on the day of your procedure. Your physician has requested that you go to www.startemmi.com and enter the access code given to you at your visit today. This web site gives a general overview about your procedure. However, you should still follow specific instructions given to you by our office regarding your preparation for the procedure.  Normal BMI (Body Mass Index- based on height and weight) is between 19 and 25. Your BMI today is Body mass index is 34.87 kg/m. Marland Kitchen Please consider follow up  regarding your BMI with your Primary Care Provider.   Thank you for choosing me and [facility].  Pricilla Riffle. Dagoberto Ligas., MD., Marval Regal

## 2017-05-23 NOTE — Telephone Encounter (Signed)
Per Fabian Sharp, PA pt will need a pre op clearance appt before being cleared for procedure. Pt see's Dr. Meda Coffee and does have appt 06/29/17 with Dr. Meda Coffee, though pt's procedure is scheduled for 06/05/17. I lmtcb to schedule appt./

## 2017-05-23 NOTE — Progress Notes (Signed)
    History of Present Illness: This is a 57 year old male with intermittent epigastric pain and chest pain.  He is status post NSTEMI in October 2018 and a drug-eluting stent was placed.  He has been maintained on Plavix and aspirin.  He relates intermittent difficulties with sternal area chest pain and epigastric pain sometimes associated with nausea.  He notes occasional difficulty swallowing solids that has not been progressive.  Despite being maintaining on a PPI twice daily and ranitidine at bedtime he has intermittent symptoms.  Although he has had typical heartburn in the past his current symptoms do not appear to be responding.  Current Medications, Allergies, Past Medical History, Past Surgical History, Family History and Social History were reviewed in [employer] record.  Physical Exam: General: Well developed, well nourished, no acute distress Head: Normocephalic and atraumatic Eyes:  sclerae anicteric, EOMI Ears: Normal auditory acuity Mouth: No deformity or lesions Lungs: Clear throughout to auscultation Chest:Tenderness along left side of sternum Heart: Regular rate and rhythm; no murmurs, rubs or bruits Abdomen: Soft, mild epigastric tenderness and non distended. No masses, hepatosplenomegaly or hernias noted. Normal Bowel sounds Rectal: not done Musculoskeletal: Symmetrical with no gross deformities  Pulses:  Normal pulses noted Extremities: No clubbing, cyanosis, edema or deformities noted Neurological: Alert oriented x 4, grossly nonfocal Psychological:  Alert and cooperative. Normal mood and affect  Assessment and Recommendations:  1.  Epigastric pain, substernal area chest pain not responsive to antireflux therapy.  Rule out refractory GERD.  Suspect a component of musculoskeletal pain.  Continue Prevacid 30 mg twice daily and ranitidine 150 mg at bedtime follow standard antireflux measures.  Schedule EGD with possible biopsy and possible  dilation. The risks (including bleeding, perforation, infection, missed lesions, medication reactions and possible hospitalization or surgery if complications occur), benefits, and alternatives to endoscopy with possible biopsy and possible dilation were discussed with the patient and they consent to proceed.   2.  Intermittent nonprogressive solid food dysphasia.  Rule out esophageal stricture.  EGD as above.  3. Hold Plavix 5 days before procedure - will instruct when and how to resume after procedure. Low but real risk of cardiovascular event such as heart attack, stroke, embolism, thrombosis or ischemia/infarct of other organs off Plavix explained and need to seek urgent help if this occurs. The patient consents to proceed.  His cardiologist, Dr. Meda Coffee, indicated in her note from December that holding Plavix temporarily 6 months after the stent was reasonable if needed to proceed with EGD.  Since dilation will likely be needed we will need to hold Plavix. Will communicate by phone or EMR with patient's prescribing provider to confirm that holding Plavix is reasonable in this case.  He is advised to remain on aspirin daily.

## 2017-05-23 NOTE — Telephone Encounter (Signed)
   [name] 1961-01-01 [id number]  Dear Dr. Meda Coffee:  We have scheduled the above named patient for a(n) Upper Endoscopy procedure. Our records show that (s)he is on anticoagulation therapy.  Please advise as to whether the patient may come off their therapy of Plavix 5 days prior to their procedure which is scheduled for 06/05/17.  Please route your response to Marlon Pel, CMA or fax response to [phone].  Sincerely,    [facility]

## 2017-05-23 NOTE — Telephone Encounter (Signed)
   Primary Cardiologist:No primary care provider on file.  Chart reviewed as part of pre-operative protocol coverage. Because of [name] past medical history and time since last visit, he/she will require a follow-up visit in order to better assess preoperative cardiovascular risk.  Pre-op covering staff: - Please schedule appointment and call patient to inform them. - Please contact requesting surgeon's office via preferred method (i.e, phone, fax) to inform them of need for appointment prior to surgery.  Please schedule appt ASAP with Dr. Meda Coffee or with an APP on a day that Dr. Meda Coffee is in the office.  [name], PA  05/23/2017, 2:42 PM

## 2017-05-24 ENCOUNTER — Telehealth: Payer: Self-pay | Admitting: Cardiology

## 2017-05-24 NOTE — Telephone Encounter (Signed)
Placed call to pt.  He has been scheduled to see Richardson Dopp, PA-C 05/29/17.

## 2017-05-24 NOTE — Telephone Encounter (Signed)
Follow Up   Patient called back

## 2017-05-24 NOTE — Telephone Encounter (Signed)
Mr. Arocho is calling because he states that he saw Dr. Meda Coffee last month and she told him to go ahead and have the procedure . Not sure why he needs to come into see Dr. Meda Coffee when she told him to go ahead and have the procedure . Please call

## 2017-05-24 NOTE — Telephone Encounter (Signed)
Primary Cardiologist:No primary care provider on file.  Chart reviewed as part of pre-operative protocol coverage. Because of [name] past medical history and time since last visit, he/she will require a follow-up visit in order to better assess preoperative cardiovascular risk.  Pre-op covering staff: - Please schedule appointment and call patient to inform them. - Please contact requesting surgeon's office via preferred method (i.e, phone, fax) to inform them of need for appointment prior to surgery.  Please schedule appt ASAP with Dr. Meda Coffee or with an APP on a day that Dr. Meda Coffee is in the office.  Tami Lin Duke, PA  05/23/2017, 2:42 PM

## 2017-05-24 NOTE — Telephone Encounter (Signed)
Pt calling back to schedule his pre-op clearance appt, as advised by Fabian Sharp PA-C on 05/23/17 clearance note, sent by pts GI  MD.  Scheduled the pt to see Richardson Dopp PA-C on next Tuesday 05/29/17 at 0815, for the pt requested this day and cannot come any sooner.  Advised the pt to arrive 15 mins prior to his appt. Will send a note to pre-op call back pool that pts appt is made.  Pt verbalized understanding and agrees with this plan.

## 2017-05-24 NOTE — Telephone Encounter (Signed)
Left a message for the pt to call back.  

## 2017-05-25 ENCOUNTER — Encounter: Payer: Self-pay | Admitting: Gastroenterology

## 2017-05-29 ENCOUNTER — Telehealth: Payer: Self-pay

## 2017-05-29 ENCOUNTER — Ambulatory Visit (INDEPENDENT_AMBULATORY_CARE_PROVIDER_SITE_OTHER): Payer: BLUE CROSS/BLUE SHIELD | Admitting: Cardiology

## 2017-05-29 ENCOUNTER — Encounter: Payer: Self-pay | Admitting: Cardiology

## 2017-05-29 VITALS — BP 118/80 | HR 62 | Ht 70.0 in | Wt 243.4 lb

## 2017-05-29 DIAGNOSIS — Z01810 Encounter for preprocedural cardiovascular examination: Secondary | ICD-10-CM | POA: Diagnosis not present

## 2017-05-29 DIAGNOSIS — I10 Essential (primary) hypertension: Secondary | ICD-10-CM | POA: Diagnosis not present

## 2017-05-29 DIAGNOSIS — E785 Hyperlipidemia, unspecified: Secondary | ICD-10-CM

## 2017-05-29 DIAGNOSIS — I2510 Atherosclerotic heart disease of native coronary artery without angina pectoris: Secondary | ICD-10-CM

## 2017-05-29 DIAGNOSIS — E7800 Pure hypercholesterolemia, unspecified: Secondary | ICD-10-CM

## 2017-05-29 DIAGNOSIS — I255 Ischemic cardiomyopathy: Secondary | ICD-10-CM

## 2017-05-29 LAB — HEPATIC FUNCTION PANEL
ALT: 20 IU/L (ref 0–44)
AST: 23 IU/L (ref 0–40)
Albumin: 4.4 g/dL (ref 3.5–5.5)
Alkaline Phosphatase: 45 IU/L (ref 39–117)
Bilirubin Total: 0.3 mg/dL (ref 0.0–1.2)
Bilirubin, Direct: 0.08 mg/dL (ref 0.00–0.40)
Total Protein: 7.3 g/dL (ref 6.0–8.5)

## 2017-05-29 LAB — LIPID PANEL
Chol/HDL Ratio: 3.7 ratio (ref 0.0–5.0)
Cholesterol, Total: 245 mg/dL — ABNORMAL HIGH (ref 100–199)
HDL: 66 mg/dL (ref >39)
LDL Calculated: 160 mg/dL — ABNORMAL HIGH (ref 0–99)
Triglycerides: 93 mg/dL (ref 0–149)
VLDL Cholesterol Cal: 19 mg/dL (ref 5–40)

## 2017-05-29 MED ORDER — ROSUVASTATIN CALCIUM 20 MG PO TABS: 20.0000 mg | ORAL_TABLET | Freq: Every day | ORAL | 3 refills | Status: DC

## 2017-05-29 NOTE — Progress Notes (Signed)
Cardiology Office Note:    Date:  05/29/2017   ID:  [name], DOB 27-Dec-1960, MRN [id number]  PCP:  Jeffrey Pal, DO  Cardiologist:  Jeffrey Dawley, MD  Referring MD: [name]*   Chief Complaint  Patient presents with  . Pre-op Exam    History of Present Illness:    [name] is a 57 y.o. male with a past medical history significant for CAD, chronic combined CHF, HTN, HLD, tobacco use, GERD, history of drug use, TIA. [name] had a NSTEMI in 11/2016 treated with a DES to the circumflex with irregularity in the LAD and RCA but no obstructive lesions. His cath also suggested acute diastolic CHF EF 88% and LVEDP 24. Relook cardiac catheterization for chest pain 12/06/16 showed mild to moderate nonobstructive coronary disease consistent with cath 11/27/16 with widely patent circumflex/OM stent, mildly elevated LV filling pressure 20 mmHg and EF 45%. He was given a dose of IV Lasix without significant improvement in pain. Colchicine was added for possible inflammation but he was unable to afford it. 2-D echo 11/28/16 moderate LVH EF 45-50% with hypokinesis of the basal mid lateral, inferolateral and inferior myocardium.   [name] has had continued complaints of chest pain thought to be GI related, GERD and esophageal stricture. His last visit with [name] was on 03/29/2017 at which time she indicated possible need for esophageal dilatation and that he would be able to be off Plavix for 1 week surrounding the procedure as long as it had been 6 months since his stent placement.   [name] is scheduled for an upper endoscopy procedure on 06/05/17. [facility] has requested clearance to come off of Plavix for 5 days prior to the procedure.   Today he is here for pre-procedure cardiac evaluation. He continues to have lower chest discomfort with occ abdominal pain.cramping that is responsive to prevacid and Tums. It especially worse at night and after certain  foods like beef stew and chicken wings. His discomfort improved somewhat with the switch from protonix to prevacid but still quite significant. He has had a esophageal stretching about 8 years ago and is followed by GI.   [name] denies dyspnea, orthopnea, PND, edema, lightheadedness. He is tolerating the crestor without problems.   He is trying to lose wt and feels that he has cut out most unhealthy foods including sweets and white flour. He identifies portion control as a problem. He also eats lots of nuts.    Past Medical History:  Diagnosis Date  . BPH (benign prostatic hyperplasia)   . CAD (coronary artery disease)    a. NSTEMI troponin >65 with cath 11/2016 S/p PTCA & DES to mid circumflex; irregularies in LAD & RCA, LVEDP 24, EF 50% and 45-50% by echo.  . Chronic combined systolic and diastolic CHF (congestive heart failure) (HCC)    a. EF 45%.  . Cluster headache    hx of  . Complication of anesthesia    difficult waking up"  . Diverticulosis   . Drug abuse ([name])    hx of  . GERD (gastroesophageal reflux disease)   . Hiatal hernia   . Hyperlipidemia   . Hypertension   . Hypothyroidism   . Nontoxic multinodular goiter   . NSTEMI (non-ST elevated myocardial infarction) ([name])   . Perforated nasal septum   . Pulmonary nodule --- CT 10/19/2014: Nodule is stable, no further routine x-rays 01/06/2009  . TIA (transient ischemic attack)   . Trigeminal  neuralgia   . Tubular adenoma of colon 12/2015  . Unspecified asthma(493.90)     Past Surgical History:  Procedure Laterality Date  . APPENDECTOMY    . CARDIAC CATHETERIZATION N/A 11/25/2014   Procedure: Left Heart Cath and Coronary Angiography;  Surgeon: Jeffrey Crome, MD;  Location: [name];  Service: Cardiovascular;  Laterality: N/A;  . CARDIAC CATHETERIZATION  12/08/2008   [name] 08/24/2010  . COLECTOMY  ~ 1976   "I had a blockage"  . CORONARY STENT INTERVENTION N/A 11/27/2016   Procedure: CORONARY STENT  INTERVENTION;  Surgeon: Jeffrey Crome, MD;  Location: [name];  Service: Cardiovascular;  Laterality: N/A;  . CORONARY STENT PLACEMENT  11/27/2016   STENT XIENCE ALPINE RX 3.0X15 drug eluting stent was successfully placed  . CYST EXCISION Left 10/31/2016   Jaw  . DESTRUCTION TRIGEMINAL NERVE VIA NEUROLYTIC AGENT  x 3 , R side last ~2010  . KNEE ARTHROSCOPY Right 06/26/2000   Arthroscopy followed by open lateral release/notes 09/06/2010  . LEFT HEART CATH AND CORONARY ANGIOGRAPHY N/A 11/27/2016   Procedure: LEFT HEART CATH AND CORONARY ANGIOGRAPHY;  Surgeon: Jeffrey Crome, MD;  Location: [name];  Service: Cardiovascular;  Laterality: N/A;  . LEFT HEART CATH AND CORONARY ANGIOGRAPHY N/A 12/07/2016   Procedure: LEFT HEART CATH AND CORONARY ANGIOGRAPHY;  Surgeon: Jeffrey Bush, MD;  Location: [name];  Service: Cardiovascular;  Laterality: N/A;  . 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 SHOULDER HEMI-ARTHROPLASTY Right 05/26/2010   revision/notes 05/27/2010    Current Medications: Current Meds  Medication Sig  . amLODipine (NORVASC) 2.5 MG tablet Take 1 tablet (2.5 mg total) by mouth daily.  Marland Kitchen aspirin EC 81 MG tablet Take 81 mg by mouth daily.  . clopidogrel (PLAVIX) 75 MG tablet Take 1 tablet (75 mg total) by mouth daily with breakfast.  . colchicine 0.6 MG tablet Take 1 tablet (0.6 mg total) by mouth 2 (two) times daily.  Marland Kitchen ezetimibe (ZETIA) 10 MG tablet Take 1 tablet (10 mg total) by mouth daily.  . lansoprazole (PREVACID) 30 MG capsule Take 1 capsule (30 mg total) by mouth 2 (two) times daily before a meal.  . losartan (COZAAR) 100 MG tablet TAKE 1 TABLET BY MOUTH DAILY  . metoprolol tartrate (LOPRESSOR) 25 MG tablet Take 0.5 tablets (12.5 mg total) by mouth 2 (two) times daily.  . nitroGLYCERIN (NITROSTAT) 0.4 MG SL tablet Place 1 tablet (0.4 mg total) under the tongue every 5 (five) minutes as needed for chest  pain.  Marland Kitchen ondansetron (ZOFRAN) 4 MG tablet Take 1 tablet (4 mg total) by mouth every 8 (eight) hours as needed for nausea or vomiting.  . promethazine (PHENERGAN) 12.5 MG tablet Take 12.5 mg by mouth every 6 (six) hours as needed for nausea or vomiting.  . ranitidine (ZANTAC) 150 MG tablet Take 1 tablet (150 mg total) by mouth at bedtime.  . rosuvastatin (CRESTOR) 10 MG tablet Take 1 tablet (10 mg total) by mouth daily.  . traZODone (DESYREL) 50 MG tablet Take 0.5-1 tablets (25-50 mg total) by mouth at bedtime as needed for sleep.     Allergies:   Tramadol   Social History   Socioeconomic History  . Marital status: Married    Spouse name: None  . Number of children: 2  . Years of education: None  . Highest education level: None  Social Needs  . Financial resource strain: None  .  Food insecurity - worry: None  . Food insecurity - inability: None  . Transportation needs - medical: None  . Transportation needs - non-medical: None  Occupational History  . Occupation: Statistician  Tobacco Use  . Smoking status: Former Smoker    Packs/day: 0.25    Years: 12.00    Pack years: 3.00    Types: Cigarettes    Last attempt to quit: 11/15/2016    Years since quitting: 0.5  . Smokeless tobacco: Never Used  Substance and Sexual Activity  . Alcohol use: Yes    Comment: occ  . Drug use: No  . Sexual activity: None  Other Topics Concern  . None  Social History Narrative   HS[employer]   [employer] school in [place]   Married - '84 - 5 years divorced; remarried '96   1 son - '85               Family History: The patient's family history includes CAD in his father; Cancer in his father; Diabetes in his mother; Heart disease in his father and maternal aunt; Heart failure in his father; Hyperlipidemia in his mother; Hypertension in his father and mother; Mesothelioma in his father; Sudden death in his father. There is no history of Prostate cancer or Colon cancer. ROS:   Please  see the history of present illness.     All other systems reviewed and are negative.  EKGs/Labs/Other Studies Reviewed:    The following studies were reviewed today:  2-D echo 11/28/16 - Left ventricle: The cavity size was normal. There was moderate concentric hypertrophy. Systolic function was mildly reduced. The estimated ejection fraction was in the range of 45% to 50%. There is hypokinesis of the basal-midlateral, inferolateral, and inferior myocardium. - Right ventricle: The cavity size was mildly dilated. Wall thickness was normal. - Right atrium: The atrium was mildly to moderately dilated. - Atrial septum: There was increased thickness of the septum, consistent with lipomatous hypertrophy. - Pulmonic valve: There was trivial regurgitation. - Pulmonary arteries: Systolic pressure could not be accurately estimated.  Echocardiogram 02/13/2017 - Left ventricle: The cavity size was normal. Wall thickness was normal. Systolic function was normal. The estimated ejection fraction was in the range of 55% to 60%. Wall motion was normal; there were no regional wall motion abnormalities. There was an increased relative contribution of atrial contraction to ventricular filling. Doppler parameters are consistent with abnormal left ventricular relaxation (grade 1 diastolic dysfunction).  Impressions: - Normal study.  Cardiac catheterization 11/27/16 Conclusion   Non-ST elevation myocardial infarction presentation with ongoing chest pain and rising troponin greater than 9.  Total occlusion of the mid circumflex with prolonged pain presentation due to the presence of collaterals.   Successful PCI of the mid circumflex total occlusion reducing the obstruction to less than 20% using a 3.0 Xience Alpine drug-eluting stent to restorie TIMI grade 3 flow. Final postdilatation balloon diameter was 3.5 [place] balloon.  Irregularities are noted in the LAD and right  coronary but no significant obstructive lesions are seen.  Acute diastolic heart failure with LVEF 50%, and EDP 24 mmHg.  RECOMMENDATIONS:   Aspirin and Plavix for at least 6 months and preferably 12.  Aggressive risk factor modification.    Cardiac cath 12/07/16 Conclusions: 1. Mild to moderate, non-obstructive coronary artery disease similar to conclusion of prior catheterization on 11/27/16. 2. Widely patent LCx/OM stent. 3. Mildly elevated left ventricular filling pressure with LVEF 45%.  Recommendations: 1. Continue DAPT with  ASA and clopidogrel x 1 year. 2. Start isosorbide mononitrate (as patient was not taking this medication at home after recent hospital discharge). 3. Begin furosemide for possible component of heart failure. 4. If pain continues despite aforementioned interventions, consider empiric treatment for pericarditis, given recent MI.     EKG:  EKG is ordered today.  The ekg ordered today demonstrates NSR at 62 bpm, no changes.   Recent Labs: 10/09/2016: Magnesium 2.1 11/24/2016: TSH 0.96 12/07/2016: B Natriuretic Peptide 64.6 01/22/2017: ALT 25; BUN 23; Creatinine, Ser 1.05; Hemoglobin 15.6; Platelets 150; Potassium 3.9; Sodium 137   Recent Lipid Panel    Component Value Date/Time   CHOL 248 (H) 01/10/2017 1159   TRIG 44 01/10/2017 1159   HDL 95 01/10/2017 1159   CHOLHDL 2.6 01/10/2017 1159   CHOLHDL 3.8 11/27/2016 0123   VLDL 20 11/27/2016 0123   LDLCALC 144 (H) 01/10/2017 1159   LDLDIRECT 209.8 07/20/2009 0951    Physical Exam:    VS:  BP 118/80   Pulse 62   Ht 5\' 10"  (1.778 m)   Wt 243 lb 6.4 oz (110.4 kg)   SpO2 98%   BMI 34.92 kg/m     Wt Readings from Last 3 Encounters:  05/29/17 243 lb 6.4 oz (110.4 kg)  05/23/17 243 lb (110.2 kg)  04/11/17 237 lb (107.5 kg)     Physical Exam  Constitutional: He is oriented to person, place, and time. He appears well-developed and well-nourished. No distress.  HENT:  Head: Normocephalic and  atraumatic.  Neck: Normal range of motion. Neck supple. No JVD present.  Cardiovascular: Normal rate, regular rhythm and normal heart sounds. Exam reveals no gallop and no friction rub.  No murmur heard. Pulmonary/Chest: Effort normal and breath sounds normal. No respiratory distress. He has no wheezes. He has no rales.  Abdominal: Soft. Bowel sounds are normal. He exhibits no distension.  Musculoskeletal: Normal range of motion. He exhibits no edema or deformity.  Neurological: He is alert and oriented to person, place, and time.  Skin: Skin is warm and dry.  Psychiatric: He has a normal mood and affect. His behavior is normal. Thought content normal.     ASSESSMENT:    1. Preoperative cardiovascular examination   2. Coronary artery disease involving native coronary artery of native heart without angina pectoris   3. Ischemic cardiomyopathy   4. Essential hypertension   5. Hypercholesteremia    PLAN:    In order of problems listed above:  CAD: S/P NSTEMI 11/27/16 treated with DES to the mid circumflex, otherwise nonobstructive disease.  Patient had complaints of recurrent chest pain and a follow-up cath on 12/07/16 revealed widely patent circumflex stent and mild to moderate nonobstructive CAD, LVEF 45%.  Repeat echocardiogram in October 2018 showed recovery of LVEF to 55-60%.  Patient has completed cardiac rehab.  He has continued to have complaints of chest pains that are nonexertional and appear to be related to GI problems, specifically GERD and esophageal stricture.  Protonix was switched to Prevacid in December with mild improvement. Still having lower chest pain  And abdominal cramping, improved with prevacid and Tums.  Patient is planned for an EGD on 06/05/17 and [facility] is requesting recommendations for Plavix.  In December 2018, [name] recommended to schedule the procedure 6 months after his stenting at which time Plavix could be discontinued for a week. Reviewed with DOD who  defers to Dr. Francesca Oman prior recommendation.  Discussed small chance of stent thrombosis with  stopping Plavix with pt. He strongly feels that the benefit from the procedure outweighs the risk and wants to proceed. He will stop his Plavix today and continue aspirin. He is to restart Plavix as soon as OK with GI.   I will send a clearance letter to [facility].   Ischemic cardiomyopathy: LVEF 45% after his non-STEMI.  Repeat echo in 01/2017 showed LVEF 55-60% with no regional wall motion abnormalities.  Patient is euvolemic. Asymptomatic.  Essential hypertension: Pt is compliant with his meds. BP well controlled.   Hypercholesterolemia: Baseline LDL noted to be 209, he was started on high intensity statin with atorvastatin 80 mg daily with his MI.  Follow-up LDL was 144.  Zetia was added.  He was not tolerating the atorvastatin well so he was switched to Crestor 10 mg daily in December 2018.  Will obtain follow-up lipid panel and LFTs today. Discussed heart healthy diet and portion control.     Medication Adjustments/Labs and Tests Ordered: Current medicines are reviewed at length with the patient today.  Concerns regarding medicines are outlined above. Labs and tests ordered and medication changes are outlined in the patient instructions below:  There are no Patient Instructions on file for this visit.   Signed, Daune Perch, NP  05/29/2017 8:41 AM    [facility]

## 2017-05-29 NOTE — Telephone Encounter (Signed)
Notes recorded by Teressa Senter, RN on 05/29/2017 at 5:27 PM EST Patient made aware of results and Janine's recommendation to increase crestor to 20 mg once a day. Patient scheduled for repeat labs on 07/24/17. Patient in agreement with plan and thankful for the call.

## 2017-05-29 NOTE — Telephone Encounter (Signed)
   Primary Cardiologist: Ena Dawley, MD   [name] was seen on 05/29/2017 by Daune Perch, NP and has been doing well from a cardiac standpoint. He continues to have GI type lower chest/abdominal pain. EKG with no changes.   The Patient had cardiac stenting on 11/27/2016. He has been compliant with DAPT for 6 months. Per Dr. Meda Coffee, the patient may stop his Plavix for 7 days surrounding his procedure. He should continue aspirin and resume Plavix as soon as safe per GI.   The patient understands the small risk of stent thrombosis and feels that the benefit of the procedure outweighs the risk. This is reasonable.   I will route this recommendation to the requesting party via Epic fax function and remove from pre-op pool.  Please call with questions.  Daune Perch, NP 05/29/2017, 9:50 AM

## 2017-05-29 NOTE — Patient Instructions (Signed)
Medication Instructions:  1. Your physician recommends that you continue on your current medications as directed. Please refer to the Current Medication list given to you today.   Labwork: TODAY FASTING LIPID AND LIVER PANEL  Testing/Procedures: NONE ORDERED TODAY  Follow-Up: KEEP YOUR APPT WITH DR. Meda Coffee 06/29/17  Any Other Special Instructions Will Be Listed Below (If Applicable). YOU WILL NEED TO HOLD YOUR PLAVIX 7 DAYS PRIOR TO PROCEDURE; YOU WILL NEED TO RESUME PLAVIX AS SOON AS THE SURGEON HAS GIVEN CLEARANCE TO RESUME PLAVIX     If you need a refill on your cardiac medications before your next appointment, please call your pharmacy.

## 2017-05-29 NOTE — Telephone Encounter (Signed)
-----   Message from Daune Perch, NP sent at 05/29/2017  5:07 PM EST ----- The cholesterol test done today showed that his total cholesterol is about the same as it was in September, however, the LDL (bad cholesterol) has increased from 144 to 160. The goal for LDL for him is less than 70. I would like to increase his Crestor to 20 mg daily since he was tolerating the 10 mg if he agrees. He should at least try.  Daune Perch, NP

## 2017-06-05 ENCOUNTER — Encounter: Payer: Self-pay | Admitting: Gastroenterology

## 2017-06-05 ENCOUNTER — Ambulatory Visit (AMBULATORY_SURGERY_CENTER): Payer: BLUE CROSS/BLUE SHIELD | Admitting: Gastroenterology

## 2017-06-05 ENCOUNTER — Other Ambulatory Visit: Payer: Self-pay

## 2017-06-05 VITALS — BP 119/81 | HR 63 | Temp 98.0°F | Resp 13 | Ht 70.0 in | Wt 243.0 lb

## 2017-06-05 DIAGNOSIS — R1310 Dysphagia, unspecified: Secondary | ICD-10-CM | POA: Diagnosis present

## 2017-06-05 DIAGNOSIS — R1013 Epigastric pain: Secondary | ICD-10-CM

## 2017-06-05 DIAGNOSIS — K228 Other specified diseases of esophagus: Secondary | ICD-10-CM

## 2017-06-05 DIAGNOSIS — R079 Chest pain, unspecified: Secondary | ICD-10-CM | POA: Diagnosis not present

## 2017-06-05 DIAGNOSIS — K2950 Unspecified chronic gastritis without bleeding: Secondary | ICD-10-CM | POA: Diagnosis not present

## 2017-06-05 MED ORDER — SODIUM CHLORIDE 0.9 % IV SOLN: 500.0000 mL | INTRAVENOUS | Status: DC

## 2017-06-05 NOTE — Op Note (Signed)
[facility] Patient Name: Everet Flagg Procedure Date: 06/05/2017 8:04 AM MRN: [id number] Endoscopist: Ladene Artist , MD Age: 57 Referring MD:  Date of Birth: 1960-10-28 Gender: Male Account #: [id number] Procedure:                Upper GI endoscopy Indications:              Epigastric abdominal pain, Dysphagia, Unexplained                            chest pain Medicines:                Monitored Anesthesia Care Procedure:                Pre-Anesthesia Assessment:                           - Prior to the procedure, a History and Physical                            was performed, and patient medications and                            allergies were reviewed. The patient's tolerance of                            previous anesthesia was also reviewed. The risks                            and benefits of the procedure and the sedation                            options and risks were discussed with the patient.                            All questions were answered, and informed consent                            was obtained. Prior Anticoagulants: The patient has                            taken Plavix (clopidogrel), last dose was 5 days                            prior to procedure. ASA Grade Assessment: III - A                            patient with severe systemic disease. After                            reviewing the risks and benefits, the patient was                            deemed in satisfactory condition to undergo the  procedure.                           After obtaining informed consent, the endoscope was                            passed under direct vision. Throughout the                            procedure, the patient's blood pressure, pulse, and                            oxygen saturations were monitored continuously. The                            Model GIF-HQ190 [id number]) scope was introduced    through the mouth, and advanced to the second part                            of duodenum. The upper GI endoscopy was                            accomplished without difficulty. The patient                            tolerated the procedure well. Scope In: Scope Out: Findings:                 No endoscopic abnormality was evident in the                            esophagus to explain the patient's complaint of                            dysphagia. It was decided, however, to proceed with                            dilation of the entire esophagus. A guidewire was                            placed and the scope was withdrawn. Dilation was                            performed with a Savary dilator without resistance                            at 16 mm.                           Patchy mildly erythematous mucosa without bleeding                            was found in the gastric fundus and in the gastric  body. Estimated blood loss was minimal. Biopsies                            were taken with a cold forceps for histology.                           A small hiatal hernia was present.                           The exam of the stomach was otherwise normal.                           The duodenal bulb and second portion of the                            duodenum were normal. Complications:            No immediate complications. Estimated Blood Loss:     Estimated blood loss was minimal. Impression:               - No endoscopic esophageal abnormality to explain                            patient's dysphagia. Esophagus dilated.                           - Erythematous mucosa in the gastric fundus and                            gastric body. Biopsied.                           - Small hiatal hernia.                           - Normal duodenal bulb and second portion of the                            duodenum. Recommendation:           - Patient has a contact number  available for                            emergencies. The signs and symptoms of potential                            delayed complications were discussed with the                            patient. Return to normal activities tomorrow.                            Written discharge instructions were provided to the                            patient.                           -  Clear liquid diet for 2 hours, then advance as                            tolerated to soft diet today.                           - Continue present medications.                           - Await pathology results.                           - Resume Plavix (clopidogrel) at prior dose                            tomorrow. Refer to managing physician for further                            adjustment of therapy. Ladene Artist, MD 06/05/2017 8:23:26 AM This report has been signed electronically.

## 2017-06-05 NOTE — Progress Notes (Signed)
Report to PACU, RN, vss, BBS= Clear.  

## 2017-06-05 NOTE — Progress Notes (Signed)
Pt's states no medical or surgical changes since previsit or office visit. 

## 2017-06-05 NOTE — Progress Notes (Signed)
Called to room to assist during endoscopic procedure.  Patient ID and intended procedure confirmed with present staff. Received instructions for my participation in the procedure from the performing physician.  

## 2017-06-05 NOTE — Patient Instructions (Signed)
YOU HAD AN ENDOSCOPIC PROCEDURE TODAY AT [facility]:   Refer to the procedure report that was given to you for any specific questions about what was found during the examination.  If the procedure report does not answer your questions, please call your gastroenterologist to clarify.  If you requested that your care partner not be given the details of your procedure findings, then the procedure report has been included in a sealed envelope for you to review at your convenience later.  YOU SHOULD EXPECT: Some feelings of bloating in the abdomen. Passage of more gas than usual.  Walking can help get rid of the air that was put into your GI tract during the procedure and reduce the bloating. If you had a lower endoscopy (such as a colonoscopy or flexible sigmoidoscopy) you may notice spotting of blood in your stool or on the toilet paper. If you underwent a bowel prep for your procedure, you may not have a normal bowel movement for a few days.  Please Note:  You might notice some irritation and congestion in your nose or some drainage.  This is from the oxygen used during your procedure.  There is no need for concern and it should clear up in a day or so.  SYMPTOMS TO REPORT IMMEDIATELY:     Following upper endoscopy (EGD)  Vomiting of blood or coffee ground material  New chest pain or pain under the shoulder blades  Painful or persistently difficult swallowing  New shortness of breath  Fever of 100F or higher  Black, tarry-looking stools  For urgent or emergent issues, a gastroenterologist can be reached at any hour by calling [phone].   DIET:  Follow Dilation Handout..  ACTIVITY:  You should plan to take it easy for the rest of today and you should NOT DRIVE or use heavy machinery until tomorrow (because of the sedation medicines used during the test).    FOLLOW UP: Our staff will call the number listed on your records the next business day following your procedure  to check on you and address any questions or concerns that you may have regarding the information given to you following your procedure. If we do not reach you, we will leave a message.  However, if you are feeling well and you are not experiencing any problems, there is no need to return our call.  We will assume that you have returned to your regular daily activities without incident.  If any biopsies were taken you will be contacted by phone or by letter within the next 1-3 weeks.  Please call us at [phone] if you have not heard about the biopsies in 3 weeks.    SIGNATURES/CONFIDENTIALITY: You and/or your care partner have signed paperwork which will be entered into your electronic medical record.  These signatures attest to the fact that that the information above on your After Visit Summary has been reviewed and is understood.  Full responsibility of the confidentiality of this discharge information lies with you and/or your care-partner.   Resume Plavix at prior dose tomorrow,continue on remainder of medications. Information given on Hiatal Hernia and Gastritis.

## 2017-06-06 ENCOUNTER — Telehealth: Payer: Self-pay

## 2017-06-06 NOTE — Telephone Encounter (Signed)
  Follow up Call-  Call back number 06/05/2017 01/05/2016  Post procedure Call Back phone  # [phone]  Permission to leave phone message Yes Yes  Some recent data might be hidden     Patient questions:  Do you have a fever, pain , or abdominal swelling? No. Pain Score  0 *  Have you tolerated food without any problems? Yes.    Have you been able to return to your normal activities? Yes.    Do you have any questions about your discharge instructions: Diet   No. Medications  No. Follow up visit  No.  Do you have questions or concerns about your Care? No.  Actions: * If pain score is 4 or above: No action needed, pain <4.

## 2017-06-08 ENCOUNTER — Other Ambulatory Visit: Payer: Self-pay | Admitting: Family Medicine

## 2017-06-08 DIAGNOSIS — Z09 Encounter for follow-up examination after completed treatment for conditions other than malignant neoplasm: Secondary | ICD-10-CM

## 2017-06-15 ENCOUNTER — Encounter: Payer: Self-pay | Admitting: Gastroenterology

## 2017-06-29 ENCOUNTER — Ambulatory Visit: Payer: BLUE CROSS/BLUE SHIELD | Admitting: Cardiology

## 2017-06-29 DIAGNOSIS — R0989 Other specified symptoms and signs involving the circulatory and respiratory systems: Secondary | ICD-10-CM

## 2017-07-03 ENCOUNTER — Encounter: Payer: Self-pay | Admitting: Cardiology

## 2017-07-22 ENCOUNTER — Inpatient Hospital Stay (HOSPITAL_BASED_OUTPATIENT_CLINIC_OR_DEPARTMENT_OTHER)
Admission: EM | Admit: 2017-07-22 | Discharge: 2017-07-23 | DRG: 074 | Disposition: A | Discharge status: Home / Self Care | Payer: Self-pay | Attending: Cardiology | Admitting: Cardiology

## 2017-07-22 ENCOUNTER — Other Ambulatory Visit: Payer: Self-pay

## 2017-07-22 ENCOUNTER — Encounter (HOSPITAL_BASED_OUTPATIENT_CLINIC_OR_DEPARTMENT_OTHER): Payer: Self-pay | Admitting: *Deleted

## 2017-07-22 ENCOUNTER — Emergency Department (HOSPITAL_BASED_OUTPATIENT_CLINIC_OR_DEPARTMENT_OTHER): Payer: Self-pay

## 2017-07-22 DIAGNOSIS — Z8249 Family history of ischemic heart disease and other diseases of the circulatory system: Secondary | ICD-10-CM

## 2017-07-22 DIAGNOSIS — Z8349 Family history of other endocrine, nutritional and metabolic diseases: Secondary | ICD-10-CM

## 2017-07-22 DIAGNOSIS — I252 Old myocardial infarction: Secondary | ICD-10-CM

## 2017-07-22 DIAGNOSIS — Z7902 Long term (current) use of antithrombotics/antiplatelets: Secondary | ICD-10-CM

## 2017-07-22 DIAGNOSIS — Z8673 Personal history of transient ischemic attack (TIA), and cerebral infarction without residual deficits: Secondary | ICD-10-CM

## 2017-07-22 DIAGNOSIS — Z87891 Personal history of nicotine dependence: Secondary | ICD-10-CM

## 2017-07-22 DIAGNOSIS — R072 Precordial pain: Secondary | ICD-10-CM

## 2017-07-22 DIAGNOSIS — I2510 Atherosclerotic heart disease of native coronary artery without angina pectoris: Secondary | ICD-10-CM | POA: Diagnosis present

## 2017-07-22 DIAGNOSIS — Z96611 Presence of right artificial shoulder joint: Secondary | ICD-10-CM | POA: Diagnosis present

## 2017-07-22 DIAGNOSIS — M792 Neuralgia and neuritis, unspecified: Principal | ICD-10-CM | POA: Diagnosis present

## 2017-07-22 DIAGNOSIS — E785 Hyperlipidemia, unspecified: Secondary | ICD-10-CM | POA: Diagnosis present

## 2017-07-22 DIAGNOSIS — R079 Chest pain, unspecified: Secondary | ICD-10-CM

## 2017-07-22 DIAGNOSIS — Z7982 Long term (current) use of aspirin: Secondary | ICD-10-CM

## 2017-07-22 DIAGNOSIS — I110 Hypertensive heart disease with heart failure: Secondary | ICD-10-CM | POA: Diagnosis present

## 2017-07-22 DIAGNOSIS — K219 Gastro-esophageal reflux disease without esophagitis: Secondary | ICD-10-CM | POA: Diagnosis present

## 2017-07-22 DIAGNOSIS — I5042 Chronic combined systolic (congestive) and diastolic (congestive) heart failure: Secondary | ICD-10-CM | POA: Diagnosis present

## 2017-07-22 DIAGNOSIS — Z955 Presence of coronary angioplasty implant and graft: Secondary | ICD-10-CM

## 2017-07-22 LAB — CBC
HCT: 43.7 % (ref 39.0–52.0)
HCT: 43.7 % (ref 39.0–52.0)
Hemoglobin: 15.2 g/dL (ref 13.0–17.0)
Hemoglobin: 14.8 g/dL (ref 13.0–17.0)
MCH: 31.9 pg (ref 26.0–34.0)
MCH: 31.4 pg (ref 26.0–34.0)
MCHC: 34.8 g/dL (ref 30.0–36.0)
MCHC: 33.9 g/dL (ref 30.0–36.0)
MCV: 91.6 fL (ref 78.0–100.0)
MCV: 92.8 fL (ref 78.0–100.0)
Platelets: 151 10*3/uL (ref 150–400)
Platelets: 151 10*3/uL (ref 150–400)
RBC: 4.77 MIL/uL (ref 4.22–5.81)
RBC: 4.71 MIL/uL (ref 4.22–5.81)
RDW: 13.2 % (ref 11.5–15.5)
RDW: 13.3 % (ref 11.5–15.5)
WBC: 10.2 10*3/uL (ref 4.0–10.5)
WBC: 8.6 10*3/uL (ref 4.0–10.5)

## 2017-07-22 LAB — CREATININE, SERUM
Creatinine, Ser: 1.1 mg/dL (ref 0.61–1.24)
GFR calc Af Amer: >60 mL/min (ref >60)
GFR calc non Af Amer: >60 mL/min (ref >60)

## 2017-07-22 LAB — TROPONIN I
Troponin I: <0.03 ng/mL (ref <0.03)
Troponin I: <0.03 ng/mL (ref <0.03)
Troponin I: <0.03 ng/mL (ref <0.03)

## 2017-07-22 LAB — BASIC METABOLIC PANEL
Anion gap: 13 (ref 5–15)
BUN: 14 mg/dL (ref 6–20)
CO2: 22 mmol/L (ref 22–32)
Calcium: 9.5 mg/dL (ref 8.9–10.3)
Chloride: 105 mmol/L (ref 101–111)
Creatinine, Ser: 1.34 mg/dL — ABNORMAL HIGH (ref 0.61–1.24)
GFR calc Af Amer: >60 mL/min (ref >60)
GFR calc non Af Amer: 59 mL/min — ABNORMAL LOW (ref >60)
Glucose, Bld: 127 mg/dL — ABNORMAL HIGH (ref 65–99)
Potassium: 3.8 mmol/L (ref 3.5–5.1)
Sodium: 140 mmol/L (ref 135–145)

## 2017-07-22 LAB — BRAIN NATRIURETIC PEPTIDE: B Natriuretic Peptide: 24.8 pg/mL (ref 0.0–100.0)

## 2017-07-22 MED ORDER — NITROGLYCERIN 0.4 MG SL SUBL
0.4000 mg | SUBLINGUAL_TABLET | SUBLINGUAL | Status: DC | PRN
  Administered 2017-07-22 (×3): 0.4 mg via SUBLINGUAL
  Filled 2017-07-22: qty 1

## 2017-07-22 MED ORDER — NITROGLYCERIN 2 % TD OINT
1.0000 [in_us] | TOPICAL_OINTMENT | Freq: Once | TRANSDERMAL | Status: AC
  Administered 2017-07-22: 1 [in_us] via TOPICAL
  Filled 2017-07-22: qty 1

## 2017-07-22 MED ORDER — NITROGLYCERIN 0.4 MG SL SUBL
0.4000 mg | SUBLINGUAL_TABLET | SUBLINGUAL | Status: AC | PRN
  Administered 2017-07-22: 0.4 mg via SUBLINGUAL

## 2017-07-22 MED ORDER — AMLODIPINE BESYLATE 2.5 MG PO TABS
2.5000 mg | ORAL_TABLET | Freq: Every day | ORAL | Status: DC
  Administered 2017-07-22 – 2017-07-23 (×2): 2.5 mg via ORAL
  Filled 2017-07-22 (×2): qty 1

## 2017-07-22 MED ORDER — TRAZODONE HCL 50 MG PO TABS: 25.0000 mg | ORAL_TABLET | Freq: Every evening | ORAL | Status: DC | PRN

## 2017-07-22 MED ORDER — NITROGLYCERIN 0.4 MG SL SUBL
0.4000 mg | SUBLINGUAL_TABLET | SUBLINGUAL | Status: AC | PRN
  Administered 2017-07-22: 0.4 mg via SUBLINGUAL
  Filled 2017-07-22: qty 1

## 2017-07-22 MED ORDER — ACETAMINOPHEN 325 MG PO TABS: 650.0000 mg | ORAL_TABLET | ORAL | Status: DC | PRN

## 2017-07-22 MED ORDER — ONDANSETRON HCL 4 MG/2ML IJ SOLN: 4.0000 mg | Freq: Four times a day (QID) | INTRAMUSCULAR | Status: DC | PRN

## 2017-07-22 MED ORDER — CLOPIDOGREL BISULFATE 75 MG PO TABS
75.0000 mg | ORAL_TABLET | Freq: Every day | ORAL | Status: DC
  Administered 2017-07-22 – 2017-07-23 (×2): 75 mg via ORAL
  Filled 2017-07-22 (×2): qty 1

## 2017-07-22 MED ORDER — HYDROMORPHONE HCL 1 MG/ML IJ SOLN: 1.0000 mg | Freq: Once | INTRAMUSCULAR | Status: DC

## 2017-07-22 MED ORDER — NITROGLYCERIN 0.2 MG/HR TD PT24
0.2000 mg | MEDICATED_PATCH | Freq: Every day | TRANSDERMAL | Status: DC
  Filled 2017-07-22: qty 1

## 2017-07-22 MED ORDER — MORPHINE SULFATE (PF) 4 MG/ML IV SOLN
4.0000 mg | Freq: Once | INTRAVENOUS | Status: AC
  Administered 2017-07-22: 4 mg via INTRAVENOUS
  Filled 2017-07-22: qty 1

## 2017-07-22 MED ORDER — ASPIRIN EC 81 MG PO TBEC
81.0000 mg | DELAYED_RELEASE_TABLET | Freq: Every day | ORAL | Status: DC
  Administered 2017-07-23: 81 mg via ORAL
  Filled 2017-07-22: qty 1

## 2017-07-22 MED ORDER — LOSARTAN POTASSIUM 50 MG PO TABS
100.0000 mg | ORAL_TABLET | Freq: Every day | ORAL | Status: DC
Start: 2017-07-22 — End: 2017-07-23
  Administered 2017-07-22 – 2017-07-23 (×2): 100 mg via ORAL
  Filled 2017-07-22 (×2): qty 2

## 2017-07-22 MED ORDER — ALUM & MAG HYDROXIDE-SIMETH 200-200-20 MG/5ML PO SUSP
30.0000 mL | Freq: Four times a day (QID) | ORAL | Status: DC | PRN
  Administered 2017-07-22: 30 mL via ORAL
  Filled 2017-07-22: qty 30

## 2017-07-22 MED ORDER — ALUM & MAG HYDROXIDE-SIMETH 200-200-20 MG/5ML PO SUSP: 5.0000 mL | Freq: Four times a day (QID) | ORAL | Status: DC | PRN

## 2017-07-22 MED ORDER — HYDROCODONE-ACETAMINOPHEN 7.5-325 MG PO TABS
1.0000 | ORAL_TABLET | Freq: Four times a day (QID) | ORAL | Status: DC | PRN
  Administered 2017-07-22 – 2017-07-23 (×4): 1 via ORAL
  Filled 2017-07-22 (×5): qty 1

## 2017-07-22 MED ORDER — ROSUVASTATIN CALCIUM 20 MG PO TABS
20.0000 mg | ORAL_TABLET | Freq: Every day | ORAL | Status: DC
  Administered 2017-07-22 – 2017-07-23 (×2): 20 mg via ORAL
  Filled 2017-07-22 (×2): qty 1

## 2017-07-22 MED ORDER — PANTOPRAZOLE SODIUM 20 MG PO TBEC
20.0000 mg | DELAYED_RELEASE_TABLET | Freq: Every day | ORAL | Status: DC
  Administered 2017-07-23: 20 mg via ORAL
  Filled 2017-07-22: qty 1

## 2017-07-22 MED ORDER — HYDROMORPHONE HCL 1 MG/ML IJ SOLN
0.5000 mg | INTRAMUSCULAR | Status: DC | PRN
  Administered 2017-07-22: 0.5 mg via INTRAVENOUS
  Filled 2017-07-22: qty 0.5

## 2017-07-22 MED ORDER — METOPROLOL TARTRATE 12.5 MG HALF TABLET
12.5000 mg | ORAL_TABLET | Freq: Two times a day (BID) | ORAL | Status: DC
  Administered 2017-07-22 – 2017-07-23 (×2): 12.5 mg via ORAL
  Filled 2017-07-22 (×2): qty 1

## 2017-07-22 MED ORDER — ENOXAPARIN SODIUM 40 MG/0.4ML ~~LOC~~ SOLN
40.0000 mg | SUBCUTANEOUS | Status: DC
  Administered 2017-07-22: 40 mg via SUBCUTANEOUS
  Filled 2017-07-22: qty 0.4

## 2017-07-22 MED ORDER — EZETIMIBE 10 MG PO TABS
10.0000 mg | ORAL_TABLET | Freq: Every day | ORAL | Status: DC
  Administered 2017-07-22 – 2017-07-23 (×2): 10 mg via ORAL
  Filled 2017-07-22 (×2): qty 1

## 2017-07-22 MED ORDER — ALUM & MAG HYDROXIDE-SIMETH 200-200-20 MG/5 ML NICU TOPICAL
1.0000 "application " | Freq: Four times a day (QID) | TOPICAL | Status: DC | PRN
  Filled 2017-07-22: qty 355

## 2017-07-22 MED ORDER — NITROGLYCERIN IN D5W 200-5 MCG/ML-% IV SOLN
2.0000 ug/min | INTRAVENOUS | Status: DC
  Administered 2017-07-22: 10 ug/min via INTRAVENOUS
  Filled 2017-07-22: qty 250

## 2017-07-22 MED ORDER — MORPHINE SULFATE (PF) 2 MG/ML IV SOLN
2.0000 mg | Freq: Four times a day (QID) | INTRAVENOUS | Status: DC | PRN
Start: 2017-07-22 — End: 2017-07-23
  Filled 2017-07-22: qty 1

## 2017-07-22 MED ORDER — FAMOTIDINE 20 MG PO TABS: 20.0000 mg | ORAL_TABLET | Freq: Two times a day (BID) | ORAL | Status: DC

## 2017-07-22 MED ORDER — ASPIRIN 81 MG PO CHEW
324.0000 mg | CHEWABLE_TABLET | Freq: Once | ORAL | Status: AC
  Administered 2017-07-22: 324 mg via ORAL
  Filled 2017-07-22: qty 4

## 2017-07-22 NOTE — Progress Notes (Signed)
Patient currently having sudden increase in CP 8/10. On-Call MD paged and made aware. Obtaining stat EKG and giving morphine for pain.

## 2017-07-22 NOTE — ED Notes (Signed)
Pt reports an increase in his pain. Pt reassessed and EDP notified. See new orders.

## 2017-07-22 NOTE — ED Triage Notes (Addendum)
C/o anterior chest pain that started last night around 6pm. C/o soreness to left shoulder and tingling down left arm. C/o of feeling sob. C/o nausea. Denies vomitng. Pt does have a cardiac history. Pt took 2 nitro with no relief.

## 2017-07-22 NOTE — Progress Notes (Signed)
Patient arrived from [facility], attending MD paged and made aware.

## 2017-07-22 NOTE — ED Notes (Signed)
Patient transported to X-ray 

## 2017-07-22 NOTE — Progress Notes (Signed)
Pt reports 10/10 chest pain unchanged by IV ntg, vicodin, morphine, maalox interventions.  EKG obtained with no changes noted.  BP currently 160/93, HR 67, NSR.  MD notified of no change in pt pain.  Trop (-).  Will cont to monitor closely.

## 2017-07-22 NOTE — Progress Notes (Signed)
Received shift end report at bedside from [place], [place].  Pt grimacing and clenching mid chest stating CP 10/10 radiating to left arm.  Pt also noted to be hyopertensive.  Pt states morphine had no effect on chest pain.  O2 at 2L on pt, Carly administered 1 SL NTG with no relief.  BP 130/88, 2nd SL ntg given.  Per report EKG previously obtained with morphine administration with no changes noted.  MD made aware.  Will repage MD with update.

## 2017-07-22 NOTE — ED Provider Notes (Signed)
[facility] Provider Note   CSN: [id number] Arrival date & time: 07/22/17  0623     History   Chief Complaint Chief Complaint  Patient presents with  . Chest Pain    HPI Veryl Winemiller is a 57 y.o. male.  The history is provided by the patient and medical records.  Chest Pain   This is a recurrent problem. The current episode started yesterday. The problem occurs constantly. The problem has not changed since onset.The pain is associated with exertion. The pain is present in the lateral region. The pain is at a severity of 7/10. The pain is moderate. The quality of the pain is described as pressure-like, heavy and exertional. The pain radiates to the left jaw, left neck and left shoulder. Duration of episode(s) is 6 hours. The symptoms are aggravated by exertion. Associated symptoms include diaphoresis, exertional chest pressure, lower extremity edema (mild), malaise/fatigue, nausea, palpitations and shortness of breath. Pertinent negatives include no abdominal pain, no back pain, no claudication, no cough, no fever, no headaches, no irregular heartbeat, no sputum production, no syncope and no vomiting. He has tried nitroglycerin for the symptoms. The treatment provided mild relief. Risk factors include male gender and obesity.  His past medical history is significant for CAD, CHF and MI.    Past Medical History:  Diagnosis Date  . BPH (benign prostatic hyperplasia)   . CAD (coronary artery disease)    a. NSTEMI troponin >65 with cath 11/2016 S/p PTCA & DES to mid circumflex; irregularies in LAD & RCA, LVEDP 24, EF 50% and 45-50% by echo.  . Chronic combined systolic and diastolic CHF (congestive heart failure) (HCC)    a. EF 45%.  . Cluster headache    hx of  . Complication of anesthesia    difficult waking up"  . Diverticulosis   . Drug abuse ([place])    hx of  . GERD (gastroesophageal reflux disease)   . Hiatal hernia   . Hyperlipidemia   .  Hypertension   . Hypothyroidism   . Nontoxic multinodular goiter   . NSTEMI (non-ST elevated myocardial infarction) ([place])   . Perforated nasal septum   . Pulmonary nodule --- CT 10/19/2014: Nodule is stable, no further routine x-rays 01/06/2009  . TIA (transient ischemic attack)   . Trigeminal neuralgia   . Tubular adenoma of colon 12/2015  . Unspecified asthma(493.90)     Patient Active Problem List   Diagnosis Date Noted  . Ischemic cardiomyopathy 02/05/2017  . Obesity 12/08/2016  . Chronic diastolic CHF (congestive heart failure) ([place]) 12/08/2016  . Atypical chest pain 12/07/2016  . NSTEMI (non-ST elevated myocardial infarction) ([place]) 11/26/2016  . Intractable nausea and vomiting 10/09/2016  . Nausea vomiting and diarrhea 10/09/2016  . Enteritis due to Clostridium difficile 10/30/2015  . Malnutrition of moderate degree 10/21/2015  . Acute kidney injury ([place]) 10/20/2015  . PCP NOTES >>>>>>>>>>>>>>>>> 09/30/2015  . Hypercholesteremia 09/22/2015  . Renal mass, left 11/02/2014  . CAD (coronary artery disease)   . Drug abuse and dependence ([place]) 09/18/2014  . Visit for preventive health examination 02/04/2014  . Screening for ischemic heart disease 02/04/2014  . BPH associated with nocturia 12/24/2013  . Hypothyroidism 12/24/2013  . Chronic ethmoidal sinusitis 12/24/2013  . Low back pain radiating to right leg 05/10/2012  . Thrombocytopenia ([place]) 12/05/2011  . Tobacco use 11/30/2011  . Anxiety 12/26/2010  . Transient cerebral ischemia 01/07/2010  . Asthma 01/19/2009  . Pulmonary nodule --- CT 10/19/2014: Nodule  is stable, no further routine x-rays 01/06/2009  . Nontoxic multinodular goiter 12/31/2008  . DYSPNEA ON EXERTION 12/15/2008  . Trigeminal neuralgia 07/24/2007  . Dyslipidemia 12/27/2006  . Cluster headache 12/27/2006  . Essential hypertension 12/27/2006  . GERD 12/27/2006    Past Surgical History:  Procedure Laterality Date  . APPENDECTOMY    . CARDIAC  CATHETERIZATION N/A 11/25/2014   Procedure: Left Heart Cath and Coronary Angiography;  Surgeon: Belva Crome, MD;  Location: [facility];  Service: Cardiovascular;  Laterality: N/A;  . CARDIAC CATHETERIZATION  12/08/2008   Archie Endo 08/24/2010  . COLECTOMY  ~ 1976   "I had a blockage"  . CORONARY STENT INTERVENTION N/A 11/27/2016   Procedure: CORONARY STENT INTERVENTION;  Surgeon: Belva Crome, MD;  Location: [facility];  Service: Cardiovascular;  Laterality: N/A;  . CORONARY STENT PLACEMENT  11/27/2016   STENT XIENCE ALPINE RX 3.0X15 drug eluting stent was successfully placed  . CYST EXCISION Left 10/31/2016   Jaw  . DESTRUCTION TRIGEMINAL NERVE VIA NEUROLYTIC AGENT  x 3 , R side last ~2010  . KNEE ARTHROSCOPY Right 06/26/2000   Arthroscopy followed by open lateral release/notes 09/06/2010  . LEFT HEART CATH AND CORONARY ANGIOGRAPHY N/A 11/27/2016   Procedure: LEFT HEART CATH AND CORONARY ANGIOGRAPHY;  Surgeon: Belva Crome, MD;  Location: [facility];  Service: Cardiovascular;  Laterality: N/A;  . LEFT HEART CATH AND CORONARY ANGIOGRAPHY N/A 12/07/2016   Procedure: LEFT HEART CATH AND CORONARY ANGIOGRAPHY;  Surgeon: Nelva Bush, MD;  Location: [facility];  Service: Cardiovascular;  Laterality: N/A;  . 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 Ardine Bjork 09/06/2010  . SHOULDER HEMI-ARTHROPLASTY Right 05/26/2010   revision/notes 05/27/2010        Home Medications    Prior to Admission medications   Medication Sig Start Date End Date Taking? Authorizing Provider  amLODipine (NORVASC) 2.5 MG tablet Take 1 tablet (2.5 mg total) by mouth daily. 03/29/17 06/27/17  Dorothy Spark, MD  aspirin EC 81 MG tablet Take 81 mg by mouth daily.    [provider]  clopidogrel (PLAVIX) 75 MG tablet Take 1 tablet (75 mg total) by mouth daily with breakfast. 11/30/16   Bhagat, Bhavinkumar, PA  ezetimibe (ZETIA) 10 MG tablet Take 1 tablet (10 mg total)  by mouth daily. 02/05/17 05/29/17  Dorothy Spark, MD  lansoprazole (PREVACID) 30 MG capsule Take 1 capsule (30 mg total) by mouth 2 (two) times daily before a meal. 03/29/17   Dorothy Spark, MD  losartan (COZAAR) 100 MG tablet TAKE 1 TABLET BY MOUTH DAILY 06/08/17   [name], DO  metoprolol tartrate (LOPRESSOR) 25 MG tablet Take 0.5 tablets (12.5 mg total) by mouth 2 (two) times daily. 02/05/17 05/29/17  Dorothy Spark, MD  nitroGLYCERIN (NITROSTAT) 0.4 MG SL tablet Place 1 tablet (0.4 mg total) under the tongue every 5 (five) minutes as needed for chest pain. Patient not taking: Reported on 06/05/2017 12/06/16   [name], DO  ondansetron (ZOFRAN) 4 MG tablet Take 1 tablet (4 mg total) by mouth every 8 (eight) hours as needed for nausea or vomiting. Patient not taking: Reported on 06/05/2017 02/23/17   [name], DO  promethazine (PHENERGAN) 12.5 MG tablet Take 12.5 mg by mouth every 6 (six) hours as needed for nausea or vomiting.    [provider]  ranitidine (ZANTAC) 150 MG tablet Take 1 tablet (150 mg total)  by mouth at bedtime. 11/13/16 11/13/17  Ladene Artist, MD  rosuvastatin (CRESTOR) 20 MG tablet Take 1 tablet (20 mg total) by mouth daily. 05/29/17 08/27/17  Daune Perch, NP  traZODone (DESYREL) 50 MG tablet Take 0.5-1 tablets (25-50 mg total) by mouth at bedtime as needed for sleep. Patient not taking: Reported on 06/05/2017 09/05/16   Debbrah Alar, NP    Family History Family History  Problem Relation Age of Onset  . Diabetes Mother   . Hyperlipidemia Mother   . Hypertension Mother   . Hypertension Father   . Heart disease Father   . Cancer Father   . Sudden death Father        OCT 08/15/09  . CAD Father   . Heart failure Father   . Mesothelioma Father   . Heart disease Maternal Aunt   . Prostate cancer Neg Hx   . Colon cancer Neg Hx     Social History Social History   Tobacco Use  . Smoking status: Former  Smoker    Packs/day: 0.25    Years: 12.00    Pack years: 3.00    Types: Cigarettes    Last attempt to quit: 11/15/2016    Years since quitting: 0.6  . Smokeless tobacco: Never Used  Substance Use Topics  . Alcohol use: Yes    Comment: occ  . Drug use: No     Allergies   Tramadol   Review of Systems Review of Systems  Constitutional: Positive for diaphoresis and malaise/fatigue. Negative for chills, fatigue and fever.  HENT: Negative for congestion.   Eyes: Negative for visual disturbance.  Respiratory: Positive for shortness of breath. Negative for cough, sputum production, chest tightness, wheezing and stridor.   Cardiovascular: Positive for chest pain and palpitations. Negative for claudication and syncope.  Gastrointestinal: Positive for nausea. Negative for abdominal pain, constipation, diarrhea and vomiting.  Genitourinary: Negative for dysuria and flank pain.  Musculoskeletal: Negative for back pain, neck pain and neck stiffness.  Skin: Negative for wound.  Neurological: Negative for light-headedness and headaches.  Psychiatric/Behavioral: Negative for agitation.  All other systems reviewed and are negative.    Physical Exam Updated Vital Signs BP (!) 157/92   Pulse 73   Temp 98.3 F (36.8 C) (Oral)   Resp 12   Ht 5\' 10"  (1.778 m)   Wt 108.9 kg (240 lb)   SpO2 97%   BMI 34.44 kg/m   Physical Exam  Constitutional: He is oriented to person, place, and time. He appears well-developed and well-nourished.  Non-toxic appearance. He does not appear ill. No distress.  HENT:  Head: Normocephalic.  Mouth/Throat: Oropharynx is clear and moist.  Eyes: Pupils are equal, round, and reactive to light. Conjunctivae and EOM are normal.  Neck: Normal range of motion.  Cardiovascular: Normal rate.  No murmur heard. Pulmonary/Chest: Effort normal and breath sounds normal. No stridor. No respiratory distress. He has no wheezes. He has no rales. He exhibits no tenderness.    Abdominal: Soft. Bowel sounds are normal. He exhibits no distension. There is no tenderness.  Musculoskeletal: He exhibits no edema or tenderness.  Neurological: He is alert and oriented to person, place, and time. No sensory deficit.  Skin: Capillary refill takes less than 2 seconds. He is diaphoretic. No erythema. No pallor.  Psychiatric: He has a normal mood and affect.  Nursing note and vitals reviewed.    ED Treatments / Results  Labs (all labs ordered are listed, but only abnormal results  are displayed) Labs Reviewed  BASIC METABOLIC PANEL - Abnormal; Notable for the following components:      Result Value   Glucose, Bld 127 (*)    Creatinine, Ser 1.34 (*)    GFR calc non Af Amer 59 (*)    All other components within normal limits  CBC  TROPONIN I  BRAIN NATRIURETIC PEPTIDE  TROPONIN I    EKG EKG Interpretation  Date/Time:  Sunday July 22 2017 04:57:47 EDT Ventricular Rate:  91 PR Interval:    QRS Duration: 92 QT Interval:  341 QTC Calculation: 420 R Axis:   83 Text Interpretation:  Sinus rhythm Probable left atrial enlargement No significant change since last tracing Confirmed by Ripley Fraise [phone]) on 07/22/2017 5:19:14 AM   Radiology Dg Chest 2 View  Result Date: 07/22/2017 CLINICAL DATA:  Shortness of breath, chest pain. History of myocardial infarction. EXAM: CHEST - 2 VIEW COMPARISON:  Chest radiograph December 07, 2016. FINDINGS: Cardiac silhouette is upper limits of normal in size and unchanged. Stable bandlike density LEFT lung base. No pleural effusion or focal consolidation. No pneumothorax. RIGHT shoulder arthroplasty. IMPRESSION: Borderline cardiomegaly. Stable LEFT lung base atelectasis/scarring. Electronically Signed   By: Elon Alas M.D.   On: 07/22/2017 05:27    Procedures Procedures (including critical care time)  Medications Ordered in ED Medications  nitroGLYCERIN (NITRODUR - Dosed in mg/24 hr) patch 0.2 mg (has no administration  in time range)  nitroGLYCERIN (NITROSTAT) SL tablet 0.4 mg (0.4 mg Sublingual Given 07/22/17 0726)  aspirin chewable tablet 324 mg (324 mg Oral Given 07/22/17 0722)  nitroGLYCERIN (NITROSTAT) SL tablet 0.4 mg (0.4 mg Sublingual Given 07/22/17 0739)     Initial Impression / Assessment and Plan / ED Course  I have reviewed the triage vital signs and the nursing notes.  Pertinent labs & imaging results that were available during my care of the patient were reviewed by me and considered in my medical decision making (see chart for details).     Jaiceon Collister is a 57 y.o. male with a past medical history significant for CAD status post PCI, hypertension, hyperlipidemia, CHF, asthma, and GERD who presents with chest pain.  Patient reports that this feels the "exact same" as his last heart attack last year.  He describes it as a pressure and aching pain in his central chest that radiates to his left shoulder and left arm.  He reports associated shortness of breath, nausea, palpitations, diaphoresis.  He reports it is exertional.  He denies any recent trauma and denies other preceding complaints.  He reports taking 2 nitroglycerin with minimal relief in his pain.  He reports no recent fevers, chills, productive cough, urinary symptoms or other GI symptoms.  He reports some very minimal leg edema that is unchanged from prior.  On exam, lungs were clear.  No significant murmurs.  Chest was nontender and his pain was not reproducible.  Abdomen was nontender.  Legs had minimal edema.  Symmetric pulses in extremities.    Initial EKG showed no STEMI and appears similar to prior.  Initial troponin was negative, BNP not elevated, and metabolic panel showed slight elevation in creatinine.  No leukocytosis or anemia.  Chest x-ray shows stable left atelectasis and borderline cardiomegaly.    Patient was given 1 nitroglycerin sublingually without significant relief in pain.  Cardiology was called and agreed with  admission to [facility].  They recommended continue to trend his troponins as well as providing him  with a nitro patch.  They will admit him to telemetry service.  Patient will be admitted for further management of his high risk chest pain.  Prior to moving for his admission, patient continued to have chest pain that the nitroglycerin tablets and paste did not help.  He was given a dose of morphine to help with the pain.  Anticipate further management at [facility] for his chest pain.   Final Clinical Impressions(s) / ED Diagnoses   Final diagnoses:  Precordial pain    ED Discharge Orders    None      Clinical Impression: 1. Precordial pain     Disposition: Admit  This note was prepared with assistance of Dragon voice recognition software. Occasional wrong-word or sound-a-like substitutions may have occurred due to the inherent limitations of voice recognition software.     Tegeler, Gwenyth Allegra, MD 07/22/17 [phone]

## 2017-07-22 NOTE — Plan of Care (Signed)
  Problem: Clinical Measurements: Goal: Cardiovascular complication will be avoided Outcome: Progressing   Problem: Activity: Goal: Risk for activity intolerance will decrease Outcome: Progressing   Problem: Nutrition: Goal: Adequate nutrition will be maintained Outcome: Progressing   Problem: Coping: Goal: Level of anxiety will decrease Outcome: Progressing   Problem: Skin Integrity: Goal: Risk for impaired skin integrity will decrease Outcome: Progressing

## 2017-07-22 NOTE — H&P (Signed)
ADMISSION HISTORY & PHYSICAL  Patient Name: [name] Date of Encounter: 07/22/2017 Primary Care Physician: [name], DO Cardiologist: Ena Dawley, MD  Chief Complaint   Chest pain  Patient Profile   57 year old male with history of coronary artery disease, status post PCI to circumflex in August 6659, chronic diastolic heart failure, hypertension, dyslipidemia, tobacco abuse, cluster headaches, history of drug abuse and pain medication seeking behavior, recently readmitted for repeat chest pain episode in August 2018, approximately 1 week after his prior catheterization which showed patent stents.  Now presents to clinic with chest pain.  HPI   This is a 57 y.o. male with a past medical history significant as described above with history of PCI to circumflex in August 2018 and recurrent chest pain less than 1 week later.  He underwent repeat cardiac catheterization on 12/07/2016, this demonstrated widely patent stent in the circumflex and mild to moderate nonobstructive coronary disease.  LVEF was 45%.  He was seen in follow-up in August 2018.  At that time he continued to have recurrent chest pain.  Pain was felt to be reproducible and notable with movements, likely musculoskeletal and treated as such.  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  He was supposed to start cardiac rehabilitation.  He continues to have pain when follow-up with Dr. Meda Coffee in October 2018.  At that time a repeat echo showed improvement in LVEF to 55-60% with no regional wall motion abnormalities.  He now presented to [facility] with chest pain.  This is a recurrent problem that is somewhat constant.  He says the pain was a pressure sensation in the left chest radiating to left jaw neck and left shoulder lasting for about 6 hours.  He took nitroglycerin which provided no relief. Said he had recent EGD with Dr. Fuller Plan which was normal. He  presented to the emergency department today since the chest pain lasted for about 6 hours and did not go away.  EKG personally reviewed shows sinus rhythm at 74 without ischemic changes.  Troponin is a ready negative x2.  Blood pressure is elevated into the 150s over 100s.  Laboratory work is otherwise unremarkable with mildly elevated creatinine 1.34 and a normal BNP at 24.8.  Chest x-ray shows borderline cardiomegaly.  Patient was accepted by Dr. Marlou Porch and transferred to [facility] for further cardiovascular workup.  PMHx   Past Medical History:  Diagnosis Date  . BPH (benign prostatic hyperplasia)   . CAD (coronary artery disease)    a. NSTEMI troponin >65 with cath 11/2016 S/p PTCA & DES to mid circumflex; irregularies in LAD & RCA, LVEDP 24, EF 50% and 45-50% by echo.  . Chronic combined systolic and diastolic CHF (congestive heart failure) (HCC)    a. EF 45%.  . Cluster headache    hx of  . Complication of anesthesia    difficult waking up"  . Diverticulosis   . Drug abuse ([place])    hx of  . GERD (gastroesophageal reflux disease)   . Hiatal hernia   . Hyperlipidemia   . Hypertension   . Hypothyroidism   . Nontoxic multinodular goiter   . NSTEMI (non-ST elevated myocardial infarction) ([place])   . Perforated nasal septum   . Pulmonary nodule --- CT 10/19/2014: Nodule is stable, no further routine x-rays 01/06/2009  . TIA (transient ischemic attack)   . Trigeminal neuralgia   . Tubular adenoma of colon  12/2015  . Unspecified asthma(493.90)     Past Surgical History:  Procedure Laterality Date  . APPENDECTOMY    . CARDIAC CATHETERIZATION N/A 11/25/2014   Procedure: Left Heart Cath and Coronary Angiography;  Surgeon: Belva Crome, MD;  Location: [facility];  Service: Cardiovascular;  Laterality: N/A;  . CARDIAC CATHETERIZATION  12/08/2008   Archie Endo 08/24/2010  . COLECTOMY  ~ 1976   "I had a blockage"  . CORONARY STENT INTERVENTION N/A 11/27/2016   Procedure: CORONARY STENT  INTERVENTION;  Surgeon: Belva Crome, MD;  Location: [facility];  Service: Cardiovascular;  Laterality: N/A;  . CORONARY STENT PLACEMENT  11/27/2016   STENT XIENCE ALPINE RX 3.0X15 drug eluting stent was successfully placed  . CYST EXCISION Left 10/31/2016   Jaw  . DESTRUCTION TRIGEMINAL NERVE VIA NEUROLYTIC AGENT  x 3 , R side last ~2010  . KNEE ARTHROSCOPY Right 06/26/2000   Arthroscopy followed by open lateral release/notes 09/06/2010  . LEFT HEART CATH AND CORONARY ANGIOGRAPHY N/A 11/27/2016   Procedure: LEFT HEART CATH AND CORONARY ANGIOGRAPHY;  Surgeon: Belva Crome, MD;  Location: [facility];  Service: Cardiovascular;  Laterality: N/A;  . LEFT HEART CATH AND CORONARY ANGIOGRAPHY N/A 12/07/2016   Procedure: LEFT HEART CATH AND CORONARY ANGIOGRAPHY;  Surgeon: Nelva Bush, MD;  Location: [facility];  Service: Cardiovascular;  Laterality: N/A;  . 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 Ardine Bjork 09/06/2010  . SHOULDER HEMI-ARTHROPLASTY Right 05/26/2010   revision/notes 05/27/2010    FAMHx   Family History  Problem Relation Age of Onset  . Diabetes Mother   . Hyperlipidemia Mother   . Hypertension Mother   . Hypertension Father   . Heart disease Father   . Cancer Father   . Sudden death Father        OCT 2009-06-21  . CAD Father   . Heart failure Father   . Mesothelioma Father   . Heart disease Maternal Aunt   . Prostate cancer Neg Hx   . Colon cancer Neg Hx     SOCHx    reports that he quit smoking about 8 months ago. His smoking use included cigarettes. He has a 3.00 pack-year smoking history. He has never used smokeless tobacco. He reports that he drinks alcohol. He reports that he does not use drugs.  Outpatient Medications   No current facility-administered medications on file prior to encounter.    Current Outpatient Medications on File Prior to Encounter  Medication Sig Dispense Refill  . amLODipine (NORVASC) 2.5 MG  tablet Take 1 tablet (2.5 mg total) by mouth daily. 90 tablet 3  . aspirin EC 81 MG tablet Take 81 mg by mouth daily.    . clopidogrel (PLAVIX) 75 MG tablet Take 1 tablet (75 mg total) by mouth daily with breakfast. 30 tablet 11  . ezetimibe (ZETIA) 10 MG tablet Take 1 tablet (10 mg total) by mouth daily. 90 tablet 2  . lansoprazole (PREVACID) 30 MG capsule Take 1 capsule (30 mg total) by mouth 2 (two) times daily before a meal. 60 capsule 3  . losartan (COZAAR) 100 MG tablet TAKE 1 TABLET BY MOUTH DAILY 30 tablet 0  . metoprolol tartrate (LOPRESSOR) 25 MG tablet Take 0.5 tablets (12.5 mg total) by mouth 2 (two) times daily. 180 tablet 2  . nitroGLYCERIN (NITROSTAT) 0.4 MG SL tablet Place 1 tablet (0.4 mg total) under the tongue every 5 (five) minutes as needed  for chest pain. (Patient not taking: Reported on 06/05/2017) 50 tablet 1  . ondansetron (ZOFRAN) 4 MG tablet Take 1 tablet (4 mg total) by mouth every 8 (eight) hours as needed for nausea or vomiting. (Patient not taking: Reported on 06/05/2017) 30 tablet 2  . promethazine (PHENERGAN) 12.5 MG tablet Take 12.5 mg by mouth every 6 (six) hours as needed for nausea or vomiting.    . ranitidine (ZANTAC) 150 MG tablet Take 1 tablet (150 mg total) by mouth at bedtime. 30 tablet 5  . rosuvastatin (CRESTOR) 20 MG tablet Take 1 tablet (20 mg total) by mouth daily. 90 tablet 3  . traZODone (DESYREL) 50 MG tablet Take 0.5-1 tablets (25-50 mg total) by mouth at bedtime as needed for sleep. (Patient not taking: Reported on 06/05/2017) 30 tablet 3    Inpatient Medications    Scheduled Meds: . nitroGLYCERIN  0.2 mg Transdermal Daily    Continuous Infusions:   PRN Meds:    ALLERGIES   Allergies  Allergen Reactions  . Tramadol Palpitations    ROS   Pertinent items noted in HPI and remainder of comprehensive ROS otherwise negative.  Vitals   Vitals:   07/22/17 1300 07/22/17 1330 07/22/17 1355 07/22/17 1447  BP: (!) 149/102 (!) 131/93 (!)  131/96 (!) 154/107  Pulse: 74 75 83 84  Resp: 14 12 13 15   Temp:    97.8 F (36.6 C)  TempSrc:    Oral  SpO2: 96% 98% 96% 95%  Weight:    240 lb (108.9 kg)  Height:       No intake or output data in the 24 hours ending 07/22/17 1455 Filed Weights   07/22/17 0458 07/22/17 0503 07/22/17 1447  Weight: 240 lb (108.9 kg) 240 lb (108.9 kg) 240 lb (108.9 kg)    Physical Exam   General appearance: alert, mild distress and moderately obese Neck: no carotid bruit, no JVD and thyroid not enlarged, symmetric, no tenderness/mass/nodules Lungs: clear to auscultation bilaterally Heart: regular rate and rhythm Abdomen: soft, non-tender; bowel sounds normal; no masses,  no organomegaly Extremities: extremities normal, atraumatic, no cyanosis or edema Pulses: 2+ and symmetric Skin: Skin color, texture, turgor normal. No rashes or lesions Neurologic: Grossly normal Psych: Pleasant  Labs   Results for orders placed or performed during the hospital encounter of 07/22/17 (from the past 48 hour(s))  Basic metabolic panel     Status: Abnormal   Collection Time: 07/22/17  5:05 AM  Result Value Ref Range   Sodium 140 135 - 145 mmol/L   Potassium 3.8 3.5 - 5.1 mmol/L   Chloride 105 101 - 111 mmol/L   CO2 22 22 - 32 mmol/L   Glucose, Bld 127 (H) 65 - 99 mg/dL   BUN 14 6 - 20 mg/dL   Creatinine, Ser 1.34 (H) 0.61 - 1.24 mg/dL   Calcium 9.5 8.9 - 10.3 mg/dL   GFR calc non Af Amer 59 (L) >60 mL/min   GFR calc Af Amer >60 >60 mL/min    Comment: (NOTE) The eGFR has been calculated using the CKD EPI equation. This calculation has not been validated in all clinical situations. eGFR's persistently <60 mL/min signify possible Chronic Kidney Disease.    Anion gap 13 5 - 15    Comment: Performed at [facility], [address] [place], [place] [address]  CBC     Status: None   Collection Time: 07/22/17  5:05 AM  Result Value Ref Range  WBC 10.2 4.0 - 10.5 K/uL   RBC 4.77 4.22 - 5.81  MIL/uL   Hemoglobin 15.2 13.0 - 17.0 g/dL   HCT 43.7 39.0 - 52.0 %   MCV 91.6 78.0 - 100.0 fL   MCH 31.9 26.0 - 34.0 pg   MCHC 34.8 30.0 - 36.0 g/dL   RDW 13.2 11.5 - 15.5 %   Platelets 151 150 - 400 K/uL    Comment: Performed at [facility], [address] [place], [place] [address]  Troponin I     Status: None   Collection Time: 07/22/17  5:05 AM  Result Value Ref Range   Troponin I <0.03 <0.03 ng/mL    Comment: Performed at [facility], [address] [place], [place] [address]  Brain natriuretic peptide     Status: None   Collection Time: 07/22/17  5:05 AM  Result Value Ref Range   B Natriuretic Peptide 24.8 0.0 - 100.0 pg/mL    Comment: Performed at [facility], [address] [place], [place] [address]  Troponin I     Status: None   Collection Time: 07/22/17  8:49 AM  Result Value Ref Range   Troponin I <0.03 <0.03 ng/mL    Comment: Performed at [facility], [address] [place], [place] [address]    ECG   NSR at 74, no ischemic changes - Personally Reviewed  Telemetry   Sinus rhythm - Personally Reviewed  Radiology   Dg Chest 2 View  Result Date: 07/22/2017 CLINICAL DATA:  Shortness of breath, chest pain. History of myocardial infarction. EXAM: CHEST - 2 VIEW COMPARISON:  Chest radiograph December 07, 2016. FINDINGS: Cardiac silhouette is upper limits of normal in size and unchanged. Stable bandlike density LEFT lung base. No pleural effusion or focal consolidation. No pneumothorax. RIGHT shoulder arthroplasty. IMPRESSION: Borderline cardiomegaly. Stable LEFT lung base atelectasis/scarring. Electronically Signed   By: Elon Alas M.D.   On: 07/22/2017 05:27    Cardiac Studies   N/A  Assessment   1. Active Problems: 2.   Chest pain 3.   Plan   1. Story sounds like angina, however, work-up is completely benign. EKG is non-ischemic. Cardiac markers are negative. No benefit from nitro - pain is  not-reproducible on exam. Labs are unremarkable. Did not really seek narcotics when I suggested pain medication. He may need further ischemic testing tomorrow - ?myoview stress or cardiac CT. Would not recommend repeat cath since he had 2 about 9 months ago. He is allergic to tramadol - avoid NSAIDS with recent stent. ?add Ranexa - is this small vessel disease or a CRPS? Keep NPO p MN for possible further testing tomorrow.  Full Code.  Time Spent Directly with Patient:  I have spent a total of 45 minutes with patient reviewing hospital notes, telemetry, EKGs, labs and examining the patient as well as establishing an assessment and plan that was discussed with the patient. > 50% of time was spent in direct patient care.   Length of Stay:  LOS: 0 days   Pixie Casino, MD, [facility], [place] Director of the Advanced Lipid Disorders &  Cardiovascular Risk Reduction Clinic Diplomate of the American Board of Clinical Lipidology Attending Cardiologist  Direct Dial: [phone]  Fax: [phone]  Website:  [email or web address] 07/22/2017, 2:55 PM

## 2017-07-23 ENCOUNTER — Inpatient Hospital Stay (HOSPITAL_COMMUNITY): Payer: Self-pay

## 2017-07-23 ENCOUNTER — Encounter (HOSPITAL_COMMUNITY): Payer: Self-pay | Admitting: Radiology

## 2017-07-23 DIAGNOSIS — R079 Chest pain, unspecified: Secondary | ICD-10-CM

## 2017-07-23 LAB — BASIC METABOLIC PANEL
Anion gap: 10 (ref 5–15)
BUN: 14 mg/dL (ref 6–20)
CO2: 26 mmol/L (ref 22–32)
Calcium: 8.8 mg/dL — ABNORMAL LOW (ref 8.9–10.3)
Chloride: 100 mmol/L — ABNORMAL LOW (ref 101–111)
Creatinine, Ser: 1.19 mg/dL (ref 0.61–1.24)
GFR calc Af Amer: >60 mL/min (ref >60)
GFR calc non Af Amer: >60 mL/min (ref >60)
Glucose, Bld: 113 mg/dL — ABNORMAL HIGH (ref 65–99)
Potassium: 3.7 mmol/L (ref 3.5–5.1)
Sodium: 136 mmol/L (ref 135–145)

## 2017-07-23 LAB — NM MYOCAR MULTI W/SPECT W/WALL MOTION / EF
MPHR: 166 {beats}/min
Peak HR: 90 {beats}/min
Percent HR: 54 %
Rest HR: 67 {beats}/min

## 2017-07-23 LAB — LIPID PANEL
Cholesterol: 253 mg/dL — ABNORMAL HIGH (ref 0–200)
HDL: 67 mg/dL (ref >40)
LDL Cholesterol: 166 mg/dL — ABNORMAL HIGH (ref 0–99)
Total CHOL/HDL Ratio: 3.8 RATIO
Triglycerides: 99 mg/dL (ref <150)
VLDL: 20 mg/dL (ref 0–40)

## 2017-07-23 LAB — SEDIMENTATION RATE: Sed Rate: 14 mm/hr (ref 0–16)

## 2017-07-23 LAB — D-DIMER, QUANTITATIVE: D-Dimer, Quant: 0.6 ug/mL-FEU — ABNORMAL HIGH (ref 0.00–0.50)

## 2017-07-23 MED ORDER — IOPAMIDOL (ISOVUE-370) INJECTION 76%
INTRAVENOUS | Status: AC
  Administered 2017-07-23: 100 mL
  Filled 2017-07-23: qty 100

## 2017-07-23 MED ORDER — ACETAMINOPHEN 325 MG PO TABS: 650.0000 mg | ORAL_TABLET | ORAL | Status: DC | PRN

## 2017-07-23 MED ORDER — TECHNETIUM TC 99M TETROFOSMIN IV KIT
10.0000 | PACK | Freq: Once | INTRAVENOUS | Status: AC | PRN
  Administered 2017-07-23: 10 via INTRAVENOUS

## 2017-07-23 MED ORDER — TECHNETIUM TC 99M TETROFOSMIN IV KIT: 30.0000 | PACK | Freq: Once | INTRAVENOUS | Status: DC | PRN

## 2017-07-23 MED ORDER — GABAPENTIN 300 MG PO CAPS: 300.0000 mg | ORAL_CAPSULE | Freq: Two times a day (BID) | ORAL | 5 refills | Status: DC

## 2017-07-23 MED ORDER — REGADENOSON 0.4 MG/5ML IV SOLN
0.4000 mg | Freq: Once | INTRAVENOUS | Status: AC
  Administered 2017-07-23: 0.4 mg via INTRAVENOUS
  Filled 2017-07-23: qty 5

## 2017-07-23 MED ORDER — TECHNETIUM TC 99M TETROFOSMIN IV KIT
30.0000 | PACK | Freq: Once | INTRAVENOUS | Status: AC | PRN
  Administered 2017-07-23: 30 via INTRAVENOUS

## 2017-07-23 MED ORDER — REGADENOSON 0.4 MG/5ML IV SOLN
INTRAVENOUS | Status: AC
  Administered 2017-07-23: 0.4 mg via INTRAVENOUS
  Filled 2017-07-23: qty 5

## 2017-07-23 MED ORDER — GABAPENTIN 300 MG PO CAPS
300.0000 mg | ORAL_CAPSULE | Freq: Two times a day (BID) | ORAL | Status: DC
  Administered 2017-07-23: 300 mg via ORAL
  Filled 2017-07-23: qty 1

## 2017-07-23 MED ORDER — PANTOPRAZOLE SODIUM 40 MG PO TBEC: 40.0000 mg | DELAYED_RELEASE_TABLET | Freq: Every day | ORAL | Status: DC

## 2017-07-23 NOTE — Discharge Summary (Signed)
Discharge Summary    Patient ID: [name],  MRN: [id number], DOB/AGE: 1960/06/01 57 y.o.  Admit date: 07/22/2017 Discharge date: 07/23/2017  Primary Care Provider: [name] Primary Cardiologist: Ena Dawley, MD  Discharge Diagnoses    Active Problems:   Chest pain   Allergies Allergies  Allergen Reactions  . Tramadol Palpitations    Diagnostic Studies/Procedures    Lexiscan myoview 07/23/2017 IMPRESSION: 1. No reversible ischemia or infarction. Fixed perfusion defect involving the inferior wall is stable, without wall motion abnormality. 2. Global hypokinesis, similar to previous study. Progressive dilatation of the left ventricle. 3. Left ventricular ejection fraction 48% 4. Non invasive risk stratification*: Intermediate _____________   History of Present Illness     [name] is a 57 year old male with history of coronary artery disease, status post PCI to circumflex in August 0932, chronic diastolic heart failure, hypertension, dyslipidemia, tobacco abuse, cluster headaches, history of drug abuse and pain medication seeking behavior, recently readmitted for repeat chest pain episode in August 2018,approximately 1 week after his prior catheterization which showed patent stents.   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  He was supposed to start cardiac rehabilitation.  He continues to have pain when follow-up with Dr. Meda Coffee in October 2018.  At that time a repeat echo showed improvement in LVEF to 55-60% with no regional wall motion abnormalities.  He now presented to [facility] with chest pain.  This is a recurrent problem that is somewhat constant.  He says the pain was a pressure sensation in the left chest radiating to left jaw neck and left shoulder lasting for about 6 hours.  He took nitroglycerin which provided no relief. Said he had recent EGD with Dr. Fuller Plan which was normal. He  presented to the emergency department today since the chest pain lasted for about 6 hours and did not go away.  EKG personally reviewed shows sinus rhythm at 74 without ischemic changes.  Troponin is a ready negative x2.  Blood pressure is elevated into the 150s over 100s.  Laboratory work is otherwise unremarkable with mildly elevated creatinine 1.34 and a normal BNP at 24.8.  Chest x-ray shows borderline cardiomegaly.  Patient was accepted by Dr. Marlou Porch and transferred to [facility] for further cardiovascular workup.   Hospital Course     Consultants: None  Pt Continued to have 10/10 chest pain, not relieved by morphine. Labs, vitals and EKG all normal. Troponin negative. Low suspicion for angina. D-dimer was mildly elevated at 0.6. CTA chest was negative for PE.  Leane Call was done today showing no evidence of ischemia. Will start gabapentin for possible neuropathic pain. Follow up with Dr. Meda Coffee- not available until late July. Follow up appointment made with Sharrell Ku, PA.   Patient has been seen by Dr. Debara Pickett today and deemed ready for discharge home. All follow up appointments have been scheduled. Discharge medications are listed below.  _____________  Discharge Vitals Blood pressure 130/90, pulse 70, temperature 98.1 F (36.7 C), temperature source Oral, resp. rate 18, height 5\' 10"  (1.778 m), weight 245 lb 6.4 oz (111.3 kg), SpO2 99 %.  Filed Weights   07/22/17 1447 07/22/17 1554 07/23/17 0407  Weight: 240 lb (108.9 kg) 246 lb (111.6 kg) 245 lb 6.4 oz (111.3 kg)    Labs & Radiologic Studies    CBC Recent Labs    07/22/17 0505 07/22/17 1556  WBC 10.2 8.6  HGB 15.2 14.8  HCT 43.7 43.7  MCV 91.6 92.8  PLT 151 595   Basic Metabolic Panel Recent Labs    07/22/17 0505 07/22/17 1556 07/23/17 0623  NA 140  --  136  K 3.8  --  3.7  CL 105  --  100*  CO2 22  --  26  GLUCOSE 127*  --  113*  BUN 14  --  14  CREATININE 1.34* 1.10 1.19  CALCIUM 9.5  --  8.8*   Liver  Function Tests No results for input(s): AST, ALT, ALKPHOS, BILITOT, PROT, ALBUMIN in the last 72 hours. No results for input(s): LIPASE, AMYLASE in the last 72 hours. Cardiac Enzymes Recent Labs    07/22/17 0505 07/22/17 0849 07/22/17 2049  TROPONINI <0.03 <0.03 <0.03   BNP Invalid input(s): POCBNP D-Dimer Recent Labs    07/23/17 0846  DDIMER 0.60*   Hemoglobin A1C No results for input(s): HGBA1C in the last 72 hours. Fasting Lipid Panel Recent Labs    07/23/17 0623  CHOL 253*  HDL 67  LDLCALC 166*  TRIG 99  CHOLHDL 3.8   Thyroid Function Tests No results for input(s): TSH, T4TOTAL, T3FREE, THYROIDAB in the last 72 hours.  Invalid input(s): FREET3 _____________  Dg Chest 2 View  Result Date: 07/22/2017 CLINICAL DATA:  Shortness of breath, chest pain. History of myocardial infarction. EXAM: CHEST - 2 VIEW COMPARISON:  Chest radiograph December 07, 2016. FINDINGS: Cardiac silhouette is upper limits of normal in size and unchanged. Stable bandlike density LEFT lung base. No pleural effusion or focal consolidation. No pneumothorax. RIGHT shoulder arthroplasty. IMPRESSION: Borderline cardiomegaly. Stable LEFT lung base atelectasis/scarring. Electronically Signed   By: Elon Alas M.D.   On: 07/22/2017 05:27   Ct Angio Chest Pe W Or Wo Contrast  Result Date: 07/23/2017 CLINICAL DATA:  Chest pain. EXAM: CT ANGIOGRAPHY CHEST WITH CONTRAST TECHNIQUE: Multidetector CT imaging of the chest was performed using the standard protocol during bolus administration of intravenous contrast. Multiplanar CT image reconstructions and MIPs were obtained to evaluate the vascular anatomy. CONTRAST:  158mL ISOVUE-370 IOPAMIDOL (ISOVUE-370) INJECTION 76% COMPARISON:  07/22/2016, chest x-ray FINDINGS: Cardiovascular: Satisfactory opacification of the pulmonary arteries to the segmental level. No evidence of pulmonary embolism. Mildly enlarged heart. No pericardial effusion. Mild calcific  atherosclerotic disease of the coronary arteries. Mediastinum/Nodes: No enlarged mediastinal, hilar, or axillary lymph nodes. The trachea, and esophagus demonstrate no significant findings. Enlarged thyroid gland with several bilateral hypoattenuated nodules. Lungs/Pleura: Lungs are clear. No pleural effusion or pneumothorax. Lingular atelectasis. Upper Abdomen: No acute abnormality. Musculoskeletal: No chest wall abnormality. No acute or significant osseous findings. Review of the MIP images confirms the above findings. IMPRESSION: No evidence of pulmonary embolus or other acute vascular abnormality. Mild calcific atherosclerotic disease of the coronary arteries. Mildly enlarged heart. Clear lungs. Electronically Signed   By: Fidela Salisbury M.D.   On: 07/23/2017 12:47   Nm Myocar Multi W/spect W/wall Motion / Ef  Result Date: 07/23/2017 CLINICAL DATA:  Chest pain. History of myocardial infarction, cardiac catheterization and stroke. History smoking, hypertension and asthma. EXAM: MYOCARDIAL IMAGING WITH SPECT (REST AND PHARMACOLOGIC-STRESS) GATED LEFT VENTRICULAR WALL MOTION STUDY LEFT VENTRICULAR EJECTION FRACTION TECHNIQUE: Standard myocardial SPECT imaging was performed after resting intravenous injection of 10 mCi Tc-38m tetrofosmin. Subsequently, intravenous infusion of Lexiscan was performed under the supervision of the Cardiology staff. At peak effect of the drug, 30 mCi Tc-76m tetrofosmin was injected intravenously and standard myocardial SPECT imaging was performed. Quantitative  gated imaging was also performed to evaluate left ventricular wall motion, and estimate left ventricular ejection fraction. COMPARISON:  Chest radiographs 07/22/2017. Nuclear medicine myocardial scan 10/20/2014. FINDINGS: Perfusion: Decreased activity in the inferior wall appears similar to the prior study and fixed. No reversible perfusion defects are identified to suggest myocardial ischemia. Wall Motion: Mild global  hypokinesis. The left ventricle is moderately dilated, increased from previous study. No focal wall motion abnormality in the inferior wall. Left Ventricular Ejection Fraction: 48 % End diastolic volume 382 ml End systolic volume 85 ml IMPRESSION: 1. No reversible ischemia or infarction. Fixed perfusion defect involving the inferior wall is stable, without wall motion abnormality. 2. Global hypokinesis, similar to previous study. Progressive dilatation of the left ventricle. 3. Left ventricular ejection fraction 48% 4. Non invasive risk stratification*: Intermediate *2012 Appropriate Use Criteria for Coronary Revascularization Focused Update: J Am Coll Cardiol. 5053;97(6):734-193. http://content.airportbarriers.com.aspx?articleid=[email or web address] Electronically Signed   By: Richardean Sale M.D.   On: 07/23/2017 14:03   Disposition   Pt is being discharged home today in good condition.  Follow-up Plans & Appointments    Follow-up Information    [name], PA-C Follow up.   Specialties:  Cardiology, Radiology Why:  Cardiology follow up on May 6th at 1:30. Please arrive 15 minutes early for check in.  Contact information: [address] [place] [address] [phone]          Discharge Instructions    Diet - low sodium heart healthy   Complete by:  As directed    Increase activity slowly   Complete by:  As directed       Discharge Medications   Allergies as of 07/23/2017      Reactions   Tramadol Palpitations      Medication List    TAKE these medications   acetaminophen 325 MG tablet Commonly known as:  TYLENOL Take 2 tablets (650 mg total) by mouth every 4 (four) hours as needed for headache or mild pain.   amLODipine 2.5 MG tablet Commonly known as:  NORVASC Take 2.5 mg by mouth daily.   aspirin EC 81 MG tablet Take 81 mg by mouth daily.   clopidogrel 75 MG tablet Commonly known as:  PLAVIX Take 1 tablet (75 mg total) by mouth daily with  breakfast.   ezetimibe 10 MG tablet Commonly known as:  ZETIA Take 10 mg by mouth daily.   gabapentin 300 MG capsule Commonly known as:  NEURONTIN Take 1 capsule (300 mg total) by mouth 2 (two) times daily.   lansoprazole 30 MG capsule Commonly known as:  PREVACID Take 1 capsule (30 mg total) by mouth 2 (two) times daily before a meal. What changed:  when to take this   losartan 100 MG tablet Commonly known as:  COZAAR TAKE 1 TABLET BY MOUTH DAILY What changed:    how much to take  how to take this  when to take this   metoprolol tartrate 25 MG tablet Commonly known as:  LOPRESSOR Take 12.5 mg by mouth 2 (two) times daily.   nitroGLYCERIN 0.4 MG SL tablet Commonly known as:  NITROSTAT Place 1 tablet (0.4 mg total) under the tongue every 5 (five) minutes as needed for chest pain.   ondansetron 4 MG tablet Commonly known as:  ZOFRAN Take 1 tablet (4 mg total) by mouth every 8 (eight) hours as needed for nausea or vomiting.   promethazine 12.5 MG tablet Commonly known as:  PHENERGAN Take 12.5  mg by mouth every 6 (six) hours as needed for nausea or vomiting.   ranitidine 150 MG tablet Commonly known as:  ZANTAC Take 1 tablet (150 mg total) by mouth at bedtime.   rosuvastatin 20 MG tablet Commonly known as:  CRESTOR Take 1 tablet (20 mg total) by mouth daily. What changed:  when to take this   SYSTANE OP Place 1 drop into both eyes 5 (five) times daily as needed (dry eyes).   traZODone 50 MG tablet Commonly known as:  DESYREL Take 0.5-1 tablets (25-50 mg total) by mouth at bedtime as needed for sleep.          Outstanding Labs/Studies   none  Duration of Discharge Encounter   Greater than 30 minutes including physician time.  Signed, Daune Perch NP 07/23/2017, 4:42 PM

## 2017-07-23 NOTE — Progress Notes (Signed)
   [name] presented for a nuclear stress test today.  No immediate complications.  Stress imaging is pending at this time.  Preliminary EKG findings may be listed in the chart, but the stress test result will not be finalized until perfusion imaging is complete.  Abigail Butts, PA-C 07/23/2017, 11:05 AM

## 2017-07-23 NOTE — Progress Notes (Signed)
DAILY PROGRESS NOTE   Patient Name: [name] Date of Encounter: 07/23/2017  Chief Complaint   Persistent 10/10 pain overnight  Patient Profile   57 year old male with history of coronary artery disease, status post PCI to circumflex in August 8882, chronic diastolic heart failure, hypertension, dyslipidemia, tobacco abuse, cluster headaches, history of drug abuse and pain medication seeking behavior, recently readmitted for repeat chest pain episode in August 2018, approximately 1 week after his prior catheterization which showed patent stents.  Now presents to clinic with chest pain.  Subjective   Continues to have 10/10 chest pain, not relieved by morphine. Labs, vitals and EKG all normal. Troponin negative. Low suspicion for angina, but will send for stress myoview today.   Objective   Vitals:   07/22/17 2157 07/22/17 2332 07/23/17 0407 07/23/17 0753  BP: (!) 152/87 (!) 145/97 139/88   Pulse: 72 65 60 61  Resp:    19  Temp: 97.9 F (36.6 C) 98.2 F (36.8 C) 98 F (36.7 C) 98.1 F (36.7 C)  TempSrc: Oral Oral Oral Oral  SpO2: 100% 98% 99% 99%  Weight:   245 lb 6.4 oz (111.3 kg)   Height:        Intake/Output Summary (Last 24 hours) at 07/23/2017 0806 Last data filed at 07/23/2017 0408 Gross per 24 hour  Intake 287 ml  Output 500 ml  Net -213 ml   Filed Weights   07/22/17 1447 07/22/17 1554 07/23/17 0407  Weight: 240 lb (108.9 kg) 246 lb (111.6 kg) 245 lb 6.4 oz (111.3 kg)    Physical Exam   General appearance: alert and no distress Lungs: clear to auscultation bilaterally Heart: regular rate and rhythm, S1, S2 normal, no murmur, click, rub or gallop Extremities: extremities normal, atraumatic, no cyanosis or edema Neurologic: Grossly normal  Inpatient Medications    Scheduled Meds: . amLODipine  2.5 mg Oral Daily  . aspirin EC  81 mg Oral Daily  . clopidogrel  75 mg Oral Q breakfast  . enoxaparin (LOVENOX) injection  40 mg Subcutaneous Q24H  .  ezetimibe  10 mg Oral Daily  . losartan  100 mg Oral Daily  . metoprolol tartrate  12.5 mg Oral BID  . pantoprazole  20 mg Oral Daily  . rosuvastatin  20 mg Oral q1800    Continuous Infusions: . nitroGLYCERIN 15 mcg/min (07/23/17 0739)    PRN Meds: acetaminophen, alum & mag hydroxide-simeth, HYDROcodone-acetaminophen, HYDROmorphone (DILAUDID) injection, morphine injection, ondansetron (ZOFRAN) IV, traZODone   Labs   Results for orders placed or performed during the hospital encounter of 07/22/17 (from the past 48 hour(s))  Basic metabolic panel     Status: Abnormal   Collection Time: 07/22/17  5:05 AM  Result Value Ref Range   Sodium 140 135 - 145 mmol/L   Potassium 3.8 3.5 - 5.1 mmol/L   Chloride 105 101 - 111 mmol/L   CO2 22 22 - 32 mmol/L   Glucose, Bld 127 (H) 65 - 99 mg/dL   BUN 14 6 - 20 mg/dL   Creatinine, Ser 1.34 (H) 0.61 - 1.24 mg/dL   Calcium 9.5 8.9 - 10.3 mg/dL   GFR calc non Af Amer 59 (L) >60 mL/min   GFR calc Af Amer >60 >60 mL/min    Comment: (NOTE) The eGFR has been calculated using the CKD EPI equation. This calculation has not been validated in all clinical situations. eGFR's persistently <60 mL/min signify possible Chronic Kidney Disease.    Anion gap  13 5 - 15    Comment: Performed at [facility], [address] [place], [place] [address]  CBC     Status: None   Collection Time: 07/22/17  5:05 AM  Result Value Ref Range   WBC 10.2 4.0 - 10.5 K/uL   RBC 4.77 4.22 - 5.81 MIL/uL   Hemoglobin 15.2 13.0 - 17.0 g/dL   HCT 43.7 39.0 - 52.0 %   MCV 91.6 78.0 - 100.0 fL   MCH 31.9 26.0 - 34.0 pg   MCHC 34.8 30.0 - 36.0 g/dL   RDW 13.2 11.5 - 15.5 %   Platelets 151 150 - 400 K/uL    Comment: Performed at [facility], [address] [place], [place] [address]  Troponin I     Status: None   Collection Time: 07/22/17  5:05 AM  Result Value Ref Range   Troponin I <0.03 <0.03 ng/mL    Comment: Performed at [facility],  [address] [place], [place] [address]  Brain natriuretic peptide     Status: None   Collection Time: 07/22/17  5:05 AM  Result Value Ref Range   B Natriuretic Peptide 24.8 0.0 - 100.0 pg/mL    Comment: Performed at [facility], [address] [place], [place] [address]  Troponin I     Status: None   Collection Time: 07/22/17  8:49 AM  Result Value Ref Range   Troponin I <0.03 <0.03 ng/mL    Comment: Performed at [facility], [address] [place], [place] [address]  CBC     Status: None   Collection Time: 07/22/17  3:56 PM  Result Value Ref Range   WBC 8.6 4.0 - 10.5 K/uL   RBC 4.71 4.22 - 5.81 MIL/uL   Hemoglobin 14.8 13.0 - 17.0 g/dL   HCT 43.7 39.0 - 52.0 %   MCV 92.8 78.0 - 100.0 fL   MCH 31.4 26.0 - 34.0 pg   MCHC 33.9 30.0 - 36.0 g/dL   RDW 13.3 11.5 - 15.5 %   Platelets 151 150 - 400 K/uL    Comment: Performed at [facility], [place] [address], [place], [place] [address]  Creatinine, serum     Status: None   Collection Time: 07/22/17  3:56 PM  Result Value Ref Range   Creatinine, Ser 1.10 0.61 - 1.24 mg/dL   GFR calc non Af Amer >60 >60 mL/min   GFR calc Af Amer >60 >60 mL/min    Comment: (NOTE) The eGFR has been calculated using the CKD EPI equation. This calculation has not been validated in all clinical situations. eGFR's persistently <60 mL/min signify possible Chronic Kidney Disease. Performed at [facility], [place] [address], [place], [place] [address]   Troponin I     Status: None   Collection Time: 07/22/17  8:49 PM  Result Value Ref Range   Troponin I <0.03 <0.03 ng/mL    Comment: Performed at [facility] [address], [place], [place] [address]  Basic metabolic panel     Status: Abnormal   Collection Time: 07/23/17  6:23 AM  Result Value Ref Range   Sodium 136 135 - 145 mmol/L   Potassium 3.7 3.5 - 5.1 mmol/L   Chloride 100 (L) 101 - 111 mmol/L   CO2 26 22 - 32 mmol/L   Glucose, Bld 113 (H) 65 - 99  mg/dL  BUN 14 6 - 20 mg/dL   Creatinine, Ser 1.19 0.61 - 1.24 mg/dL   Calcium 8.8 (L) 8.9 - 10.3 mg/dL   GFR calc non Af Amer >60 >60 mL/min   GFR calc Af Amer >60 >60 mL/min    Comment: (NOTE) The eGFR has been calculated using the CKD EPI equation. This calculation has not been validated in all clinical situations. eGFR's persistently <60 mL/min signify possible Chronic Kidney Disease.    Anion gap 10 5 - 15    Comment: Performed at [facility] [address] [place], [place] [address]  Lipid panel     Status: Abnormal   Collection Time: 07/23/17  6:23 AM  Result Value Ref Range   Cholesterol 253 (H) 0 - 200 mg/dL   Triglycerides 99 <150 mg/dL   HDL 67 >40 mg/dL   Total CHOL/HDL Ratio 3.8 RATIO   VLDL 20 0 - 40 mg/dL   LDL Cholesterol 166 (H) 0 - 99 mg/dL    Comment:        Total Cholesterol/HDL:CHD Risk Coronary Heart Disease Risk Table                     Men   Women  1/2 Average Risk   3.4   3.3  Average Risk       5.0   4.4  2 X Average Risk   9.6   7.1  3 X Average Risk  23.4   11.0        Use the calculated Patient Ratio above and the CHD Risk Table to determine the patient's CHD Risk.        ATP III CLASSIFICATION (LDL):  <100     mg/dL   Optimal  100-129  mg/dL   Near or Above                    Optimal  130-159  mg/dL   Borderline  160-189  mg/dL   High  >190     mg/dL   Very High Performed at [facility] [address] [place], [place] [address]     ECG   NSR at 63, mild T wave changes - Personally Reviewed  Telemetry   Sinus rhythm - Personally Reviewed  Radiology    Dg Chest 2 View  Result Date: 07/22/2017 CLINICAL DATA:  Shortness of breath, chest pain. History of myocardial infarction. EXAM: CHEST - 2 VIEW COMPARISON:  Chest radiograph December 07, 2016. FINDINGS: Cardiac silhouette is upper limits of normal in size and unchanged. Stable bandlike density LEFT lung base. No pleural effusion or focal consolidation. No  pneumothorax. RIGHT shoulder arthroplasty. IMPRESSION: Borderline cardiomegaly. Stable LEFT lung base atelectasis/scarring. Electronically Signed   By: Elon Alas M.D.   On: 07/22/2017 05:27    Cardiac Studies   N/A  Assessment   1. Active Problems: 2.   Chest pain 3.   Plan   1. Plan lexiscan myoview today - no relief of pain with morphine, described as a 10/10, constant, left chest. If lexiscan is negative, ?further work-up for pain management, consider neuropathic pain med?  Time Spent Directly with Patient:  I have spent a total of 15 minutes with the patient reviewing hospital notes, telemetry, EKGs, labs and examining the patient as well as establishing an assessment and plan that was discussed personally with the patient. > 50% of time was spent in direct patient care.  Length of Stay:  LOS: 1 day   Pixie Casino, MD, FACC, [place] Director of the [facility]  Card[facility]ovascular [facility] Diplomate of the American Board of Clinical Lipidology Attending Cardiologist  Direct Dial: [phone]  Fax: [phone]  Website:  [email or web address] 07/23/2017, 8:06 AM

## 2017-07-24 ENCOUNTER — Other Ambulatory Visit: Payer: Self-pay

## 2017-08-24 ENCOUNTER — Encounter: Payer: Self-pay | Admitting: Physician Assistant

## 2017-08-24 NOTE — Progress Notes (Deleted)
Cardiology Office Note    Date:  08/24/2017  ID:  [name], DOB December 24, 1960, MRN [id number] PCP:  [name], DO  Cardiologist:  Dr. Meda Coffee   Chief Complaint: f/u chest pain  History of Present Illness:  [name] is a 57 y.o. male with history of CAD (NSTEMI s/p DES to Cx 11/2016), chronic combined CHF, CKD II, HTN, HLD, tobacco abuse, GERD, chronic nausea, BPH, cluster headache, h/o drug abuse, thyroid goiter, hypothyroidism, TIA, trigeminal neuralgia, asthma who presents for f/u hospitaization for CP.  He was remotely cathed in 2010 with nonobstructive disease. He's been struggling with chronic nausea lately with pending endoscopy before recent admission. He was admitted 11/2016 with chest pain, dyspnea, nausea and troponin peak of >65. Cath showed total occlusion of mid Cx s/p PTCA/DES. There were irregularities are noted in the LAD and right coronary but no significant obstructive lesions seen. He did have frequent atypical pain post-cath which was managed medically, felt possibly musculoskeletal. There was suspicion of pain medication seeking behavior. His cath also suggested acute diastolic CHF with EF 34% and LVEDP 24, 2D echo 11/28/16 showed moderate LVH, EF 45-50%, hypokinesis of the basal-midlateral, inferolateral, and inferior myocardium, lipomatous hypertrophy, mildly dilated RV.On day of dc he was reported to have 3/10 discomfort but no pain with ambulation. He was discharged on ASA, Plavix, Imdur, metoprolol, and atorvastatin. He had TOC visit 12/06/16 with PCP which appeared unremarkable. However, when seen in follow-up he was reporting frequent recurrences of chest pain - both similar to prior angina but also with atypical features like with position changes or associated with lightning-like shocks. He was admitted for relook cath showing mild to moderate, non-obstructive coronary artery disease similar to conclusion of prior catheterization on 11/27/16, widely  patent LCx/OM stent, mildly elevated LV filling pressure 29mHg with EF 45%. He was given a dose of IV Lasix without significant improvement in pain. Colchicine was added in case of possible component of inflammation but he was unable to afford this. His history has been complicated by intermittent chest pain since that time. Metoprolol was decreased due to dizziness. 2D Echo 01/2017 showed normalization of EF to 55-60% with grade 1 DD. He was recently admitted 3/31-4/1 with 10/10 chest pain with negative troponin. D-dimer 0.6, CTA neg for PE, ESR neg, BNP normal, LDL 166, glucose 113, Cr 1.19. Lexiscan nuclear stress test showed no reversible ischemia or infarction, fixed defect stable, global HK similar to prior study, EF 48%.  lipid clinic  Chronic chest pain CAD Chronic combined CHF Hyperlipidemia      Past Medical History:  Diagnosis Date  . BPH (benign prostatic hyperplasia)   . CAD (coronary artery disease)    a. NSTEMI troponin >65 with cath 11/2016 S/p PTCA & DES to mid circumflex; irregularies in LAD & RCA, LVEDP 24, EF 50% and 45-50% by echo.  . Chronic chest pain   . Chronic combined systolic and diastolic CHF (congestive heart failure) ([facility]    a. EF 45% in 2018. b. EF 55-60% by echo 01/2017. c. EF 48% by nuc 07/2017.  . CKD (chronic kidney disease), stage II   . Cluster headache    hx of  . Complication of anesthesia    difficult waking up"  . Diverticulosis   . Drug abuse ([facility]    hx of  . GERD (gastroesophageal reflux disease)   . Hiatal hernia   . Hyperlipidemia   . Hypertension   . Hypothyroidism   . Nontoxic multinodular  goiter   . NSTEMI (non-ST elevated myocardial infarction) ([place])   . Perforated nasal septum   . Pulmonary nodule --- CT 10/19/2014: Nodule is stable, no further routine x-rays 01/06/2009  . TIA (transient ischemic attack)   . Trigeminal neuralgia   . Tubular adenoma of colon 12/2015  . Unspecified asthma(493.90)     Past Surgical History:    Procedure Laterality Date  . APPENDECTOMY    . CARDIAC CATHETERIZATION N/A 11/25/2014   Procedure: Left Heart Cath and Coronary Angiography;  Surgeon: Belva Crome, MD;  Location: [facility];  Service: Cardiovascular;  Laterality: N/A;  . CARDIAC CATHETERIZATION  12/08/2008   Archie Endo 08/24/2010  . COLECTOMY  ~ 1976   "I had a blockage"  . CORONARY STENT INTERVENTION N/A 11/27/2016   Procedure: CORONARY STENT INTERVENTION;  Surgeon: Belva Crome, MD;  Location: [facility];  Service: Cardiovascular;  Laterality: N/A;  . CORONARY STENT PLACEMENT  11/27/2016   STENT XIENCE ALPINE RX 3.0X15 drug eluting stent was successfully placed  . CYST EXCISION Left 10/31/2016   Jaw  . DESTRUCTION TRIGEMINAL NERVE VIA NEUROLYTIC AGENT  x 3 , R side last ~2010  . KNEE ARTHROSCOPY Right 06/26/2000   Arthroscopy followed by open lateral release/notes 09/06/2010  . LEFT HEART CATH AND CORONARY ANGIOGRAPHY N/A 11/27/2016   Procedure: LEFT HEART CATH AND CORONARY ANGIOGRAPHY;  Surgeon: Belva Crome, MD;  Location: [facility];  Service: Cardiovascular;  Laterality: N/A;  . LEFT HEART CATH AND CORONARY ANGIOGRAPHY N/A 12/07/2016   Procedure: LEFT HEART CATH AND CORONARY ANGIOGRAPHY;  Surgeon: Nelva Bush, MD;  Location: [facility];  Service: Cardiovascular;  Laterality: N/A;  . 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 Ardine Bjork 09/06/2010  . SHOULDER HEMI-ARTHROPLASTY Right 05/26/2010   revision/notes 05/27/2010    Current Medications: No outpatient medications have been marked as taking for the 08/27/17 encounter (Appointment) with [name], PA-C.   ***   Allergies:   Tramadol   Social History   Socioeconomic History  . Marital status: Married    Spouse name: Not on file  . Number of children: 2  . Years of education: Not on file  . Highest education level: Not on file  Occupational History  . Occupation: Merchandiser, retail [employer]  . Financial resource strain: Not on file  . Food insecurity:    Worry: Not on file    Inability: Not on file  . Transportation needs:    Medical: Not on file    Non-medical: Not on file  Tobacco Use  . Smoking status: Former Smoker    Packs/day: 0.25    Years: 12.00    Pack years: 3.00    Types: Cigarettes    Last attempt to quit: 11/15/2016    Years since quitting: 0.7  . Smokeless tobacco: Never Used  Substance and Sexual Activity  . Alcohol use: Yes    Comment: occ  . Drug use: No  . Sexual activity: Not on file  Lifestyle  . Physical activity:    Days per week: Not on file    Minutes per session: Not on file  . Stress: Not on file  Relationships  . Social connections:    Talks on phone: Not on file    Gets together: Not on file    Attends religious service: Not on file    Active member of club or organization: Not on file  Attends meetings of clubs or organizations: Not on file    Relationship status: Not on file  Other Topics Concern  . Not on file  Social History Narrative   HSG   Culinary school in [place]   Married - July 05, 1982 - 5 years divorced; remarried '96   1 son - 06-Jul-1983               Family History:  Family History  Problem Relation Age of Onset  . Diabetes Mother   . Hyperlipidemia Mother   . Hypertension Mother   . Hypertension Father   . Heart disease Father   . Cancer Father   . Sudden death Father        OCT 2009-07-05  . CAD Father   . Heart failure Father   . Mesothelioma Father   . Heart disease Maternal Aunt   . Prostate cancer Neg Hx   . Colon cancer Neg Hx    ***  ROS:   Please see the history of present illness. Otherwise, review of systems is positive for ***.  All other systems are reviewed and otherwise negative.    PHYSICAL EXAM:   VS:  There were no vitals taken for this visit.  BMI: There is no height or weight on file to calculate BMI. GEN: Well nourished, well developed, in no acute distress  HEENT: normocephalic,  atraumatic Neck: no JVD, carotid bruits, or masses Cardiac: ***RRR; no murmurs, rubs, or gallops, no edema  Respiratory:  clear to auscultation bilaterally, normal work of breathing GI: soft, nontender, nondistended, + BS MS: no deformity or atrophy  Skin: warm and dry, no rash Neuro:  Alert and Oriented x 3, Strength and sensation are intact, follows commands Psych: euthymic mood, full affect  Wt Readings from Last 3 Encounters:  07/23/17 245 lb 6.4 oz (111.3 kg)  06/05/17 243 lb (110.2 kg)  05/29/17 243 lb 6.4 oz (110.4 kg)      Studies/Labs Reviewed:   EKG:  EKG was ordered today and personally reviewed by me and demonstrates *** EKG was not ordered today.***  Recent Labs: 10/09/2016: Magnesium 2.1 11/24/2016: TSH 0.96 05/29/2017: ALT 20 07/22/2017: B Natriuretic Peptide 24.8; Hemoglobin 14.8; Platelets 151 07/23/2017: BUN 14; Creatinine, Ser 1.19; Potassium 3.7; Sodium 136   Lipid Panel    Component Value Date/Time   CHOL 253 (H) 07/23/2017 0623   CHOL 245 (H) 05/29/2017 0915   TRIG 99 07/23/2017 0623   HDL 67 07/23/2017 0623   HDL 66 05/29/2017 0915   CHOLHDL 3.8 07/23/2017 0623   VLDL 20 07/23/2017 0623   LDLCALC 166 (H) 07/23/2017 0623   LDLCALC 160 (H) 05/29/2017 0915   LDLDIRECT 209.8 07/20/2009 0951    Additional studies/ records that were reviewed today include: Summarized above.***    ASSESSMENT & PLAN:   1. ***  Disposition: F/u with ***   Medication Adjustments/Labs and Tests Ordered: Current medicines are reviewed at length with the patient today.  Concerns regarding medicines are outlined above. Medication changes, Labs and Tests ordered today are summarized above and listed in the Patient Instructions accessible in Encounters.   Signed, [name], PA-C  08/24/2017 5:13 PM    [facility] [address], [place], [place]  [address] Phone: [phone]; Fax: [phone]

## 2017-08-27 ENCOUNTER — Ambulatory Visit: Payer: Self-pay | Admitting: Physician Assistant

## 2017-09-21 ENCOUNTER — Other Ambulatory Visit: Payer: Self-pay | Admitting: Cardiology

## 2017-12-31 ENCOUNTER — Other Ambulatory Visit: Payer: Self-pay | Admitting: Cardiology

## 2018-05-23 ENCOUNTER — Telehealth: Payer: Self-pay | Admitting: Cardiology

## 2018-05-23 MED ORDER — METOPROLOL TARTRATE 25 MG PO TABS: 12.5000 mg | ORAL_TABLET | Freq: Two times a day (BID) | ORAL | 0 refills | Status: DC

## 2018-05-23 NOTE — Telephone Encounter (Signed)
Pt is calling in to schedule an appt with Dr Meda Coffee or an APP next week, for intermittent chest pain for 2 months.  Pt has known CAD and is on Plavix.  Pt states that his chest pain is intermittent in nature, only lasting a few mins.  Pt has not used nitroglycerin at all.  Pt states sometimes he gets SOB with this.  Pt states he has NO N/V, dizziness, DOE, pre-syncopal or syncopal episodes when his symptoms occur.  Pt states he is out of town until this Sunday, in [place].  Pt is also requesting a small supply of his metoprolol tartrate [facility] [place], for he does not have enough to last until the next visit.  Scheduled the pt to see Estella Husk PA-C for next Tuesday 05/28/18 at 2 pm.  Pt aware to arrive 15 mins prior to that appt.  Advised the pt that if his symptoms worsen while he is out of town, he should report to the nearest ER for further eval.  Informed the pt that I will route this message to Dr Meda Coffee as a general FYI, to make her aware of current plan.  Pt states he will call our office right back with the address of the [facility] to [facility] his [facility] to. Pt verbalized understanding and agrees with this plan.

## 2018-05-23 NOTE — Telephone Encounter (Addendum)
Patient is calling back stating that the pharmacy only received the script for the metoprolol tartrate (LOPRESSOR).  He said the clopidogrel (PLAVIX) and one more (he can't remember the name) was going to be sent as well but wasn't.

## 2018-05-23 NOTE — Telephone Encounter (Signed)
New Message   Pt c/o of Chest Pain: STAT if CP now or developed within 24 hours  1. Are you having CP right now? Not at the moment he took medicine for Angina   2. Are you experiencing any other symptoms (ex. SOB, nausea, vomiting, sweating)? SOB when walking for a period of time, but not at the moment.   3. How long have you been experiencing CP? Over the last 2 months   4. Is your CP continuous or coming and going? Coming and going   5. Have you taken Nitroglycerin? No, last time was a month ago. ?

## 2018-05-23 NOTE — Telephone Encounter (Signed)
Follow up   [facility] [address], [place] [address]. Per the previous message.

## 2018-05-23 NOTE — Addendum Note (Signed)
Addended by: Nuala Alpha on: 05/23/2018 11:27 AM   Modules accepted: Orders

## 2018-05-23 NOTE — Telephone Encounter (Signed)
Prescription sent, per pts request.

## 2018-05-23 NOTE — Telephone Encounter (Signed)
Call came stating chest pain. See message, pt has been having cp over the last 12 months

## 2018-05-24 ENCOUNTER — Other Ambulatory Visit: Payer: Self-pay | Admitting: Cardiology

## 2018-05-24 MED ORDER — CLOPIDOGREL BISULFATE 75 MG PO TABS: 75.0000 mg | ORAL_TABLET | Freq: Every day | ORAL | 0 refills | Status: DC

## 2018-05-24 NOTE — Telephone Encounter (Signed)
°*  STAT* If patient is at the pharmacy, call can be transferred to refill team.   1. Which medications need to be refilled? (please list name of each medication and dose if known) Metoprolol Tartrate (LOPRESSOR) Plavix   2. Which pharmacy/location (including street and city if local pharmacy) is medication to [facility] sent to[facility] [address] [place]  [phone]  3. Do they need a 30 day or 90 day supply? [place]

## 2018-05-24 NOTE — Telephone Encounter (Signed)
° °  Patient calling to check the status of PLAVIX being sent to pharmacy

## 2018-05-27 ENCOUNTER — Encounter: Payer: Self-pay | Admitting: Family Medicine

## 2018-05-27 ENCOUNTER — Ambulatory Visit (INDEPENDENT_AMBULATORY_CARE_PROVIDER_SITE_OTHER): Payer: 59 | Admitting: Family Medicine

## 2018-05-27 VITALS — BP 152/92 | HR 89 | Temp 97.9°F | Ht 70.0 in | Wt 253.0 lb

## 2018-05-27 DIAGNOSIS — R05 Cough: Secondary | ICD-10-CM | POA: Diagnosis not present

## 2018-05-27 DIAGNOSIS — R053 Chronic cough: Secondary | ICD-10-CM | POA: Insufficient documentation

## 2018-05-27 DIAGNOSIS — I10 Essential (primary) hypertension: Secondary | ICD-10-CM

## 2018-05-27 DIAGNOSIS — I2510 Atherosclerotic heart disease of native coronary artery without angina pectoris: Secondary | ICD-10-CM | POA: Diagnosis not present

## 2018-05-27 DIAGNOSIS — R310 Gross hematuria: Secondary | ICD-10-CM | POA: Diagnosis not present

## 2018-05-27 LAB — URINALYSIS, ROUTINE W REFLEX MICROSCOPIC
Bilirubin Urine: NEGATIVE
Hgb urine dipstick: NEGATIVE
Leukocytes, UA: NEGATIVE
Nitrite: NEGATIVE
RBC / HPF: NONE SEEN (ref 0–?)
Specific Gravity, Urine: >=1.03 — AB (ref 1.000–1.030)
Total Protein, Urine: NEGATIVE
Urine Glucose: NEGATIVE
Urobilinogen, UA: 0.2 (ref 0.0–1.0)
WBC, UA: NONE SEEN (ref 0–?)
pH: 5.5 (ref 5.0–8.0)

## 2018-05-27 MED ORDER — EZETIMIBE 10 MG PO TABS
10.0000 mg | ORAL_TABLET | Freq: Every day | ORAL | 1 refills | Status: DC
Start: 2018-05-27 — End: 2019-02-06

## 2018-05-27 MED ORDER — AMLODIPINE BESYLATE 2.5 MG PO TABS
2.5000 mg | ORAL_TABLET | Freq: Every day | ORAL | 1 refills | Status: DC
Start: 2018-05-27 — End: 2018-06-07

## 2018-05-27 MED ORDER — LEVOCETIRIZINE DIHYDROCHLORIDE 5 MG PO TABS: 5.0000 mg | ORAL_TABLET | Freq: Every evening | ORAL | 2 refills | Status: AC

## 2018-05-27 MED ORDER — ROSUVASTATIN CALCIUM 20 MG PO TABS: 20.0000 mg | ORAL_TABLET | Freq: Every day | ORAL | 3 refills | Status: DC

## 2018-05-27 MED ORDER — LANSOPRAZOLE 30 MG PO CPDR: 30.0000 mg | DELAYED_RELEASE_CAPSULE | Freq: Two times a day (BID) | ORAL | 1 refills | Status: DC

## 2018-05-27 MED ORDER — FLUTICASONE PROPIONATE 50 MCG/ACT NA SUSP: 2.0000 | Freq: Every day | NASAL | 2 refills | Status: DC

## 2018-05-27 MED ORDER — LOSARTAN POTASSIUM 100 MG PO TABS: ORAL_TABLET | ORAL | 0 refills | Status: DC

## 2018-05-27 NOTE — Progress Notes (Signed)
Pre visit review using our clinic review tool, if applicable. No additional management support is needed unless otherwise documented below in the visit note. 

## 2018-05-27 NOTE — Progress Notes (Signed)
Chief Complaint  Patient presents with  . Cough    [name] is 58 y.o. and is here for a cough.  Duration: 7 months Productive? No Associated symptoms: dyspnea, wheezing, blood in urine Denies: fever, nasal congestion, sore throat, facial pain, chest pain, hemoptysis Hx of GERD? Yes ACEi? No   Hypertension Patient presents for hypertension follow up. He does not monitor home blood pressures. He has been out of his medication for several weeks. He is usually adhering to a healthy diet overall. Exercise: None  2 weeks ago he noticed blood in his urine.  No pain, discharge, fevers, injury.  No unintentional weight loss.  No episodes since then.  ROS:  Resp: +Cough Cardio: No chest pain  Past Medical History:  Diagnosis Date  . BPH (benign prostatic hyperplasia)   . CAD (coronary artery disease)    a. NSTEMI troponin >65 with cath 11/2016 S/p PTCA & DES to mid circumflex; irregularies in LAD & RCA, LVEDP 24, EF 50% and 45-50% by echo.  . Chronic chest pain   . Chronic combined systolic and diastolic CHF (congestive heart failure) ([place])    a. EF 45% in 10-Aug-2016. b. EF 55-60% by echo 01/2017. c. EF 48% by nuc 07/2017.  . CKD (chronic kidney disease), stage II   . Cluster headache    hx of  . Complication of anesthesia    difficult waking up"  . Diverticulosis   . Drug abuse ([place])    hx of  . GERD (gastroesophageal reflux disease)   . Hiatal hernia   . Hyperlipidemia   . Hypertension   . Hypothyroidism   . Nontoxic multinodular goiter   . NSTEMI (non-ST elevated myocardial infarction) ([place])   . Perforated nasal septum   . Pulmonary nodule --- CT 10/19/2014: Nodule is stable, no further routine x-rays 01/06/2009  . TIA (transient ischemic attack)   . Trigeminal neuralgia   . Tubular adenoma of colon 12/2015  . Unspecified asthma(493.90)    Family History  Problem Relation Age of Onset  . Diabetes Mother   . Hyperlipidemia Mother   . Hypertension Mother   .  Hypertension Father   . Heart disease Father   . Cancer Father   . Sudden death Father        OCT 2009/08/10  . CAD Father   . Heart failure Father   . Mesothelioma Father   . Heart disease Maternal Aunt   . Prostate cancer Neg Hx   . Colon cancer Neg Hx    Allergies as of 05/27/2018      Reactions   Tramadol Palpitations      Medication List       Accurate as of May 27, 2018  3:31 PM. Always use your most recent med list.        acetaminophen 325 MG tablet Commonly known as:  TYLENOL Take 2 tablets (650 mg total) by mouth every 4 (four) hours as needed for headache or mild pain.   amLODipine 2.5 MG tablet Commonly known as:  NORVASC Take 1 tablet (2.5 mg total) by mouth daily.   aspirin EC 81 MG tablet Take 81 mg by mouth daily.   clopidogrel 75 MG tablet Commonly known as:  PLAVIX Take 1 tablet (75 mg total) by mouth daily with breakfast. Please keep upcoming appt in February. Thank you   ezetimibe 10 MG tablet Commonly known as:  ZETIA Take 1 tablet (10 mg total) by mouth daily.   fluticasone 50  MCG/ACT nasal spray Commonly known as:  FLONASE Place 2 sprays into both nostrils daily.   gabapentin 300 MG capsule Commonly known as:  NEURONTIN Take 1 capsule (300 mg total) by mouth 2 (two) times daily.   lansoprazole 30 MG capsule Commonly known as:  PREVACID Take 1 capsule (30 mg total) by mouth 2 (two) times daily before a meal.   levocetirizine 5 MG tablet Commonly known as:  XYZAL Take 1 tablet (5 mg total) by mouth every evening.   losartan 100 MG tablet Commonly known as:  COZAAR TAKE 1 TABLET (100 MG)  BY MOUTH DAILY   metoprolol tartrate 25 MG tablet Commonly known as:  LOPRESSOR Take 0.5 tablets (12.5 mg total) by mouth 2 (two) times daily.   nitroGLYCERIN 0.4 MG SL tablet Commonly known as:  NITROSTAT Place 1 tablet (0.4 mg total) under the tongue every 5 (five) minutes as needed for chest pain.   promethazine 12.5 MG tablet Commonly known  as:  PHENERGAN Take 12.5 mg by mouth every 6 (six) hours as needed for nausea or vomiting.   rosuvastatin 20 MG tablet Commonly known as:  CRESTOR Take 1 tablet (20 mg total) by mouth at bedtime.   SYSTANE OP Place 1 drop into both eyes 5 (five) times daily as needed (dry eyes).   traZODone 50 MG tablet Commonly known as:  DESYREL Take 0.5-1 tablets (25-50 mg total) by mouth at bedtime as needed for sleep.       BP (!) 152/92 (BP Location: Left Arm, Patient Position: Sitting, Cuff Size: Large)   Pulse 89   Temp 97.9 F (36.6 C) (Oral)   Ht 5\' 10"  (1.778 m)   Wt 253 lb (114.8 kg)   SpO2 97%   BMI 36.30 kg/m  Gen: Awake, alert, appears stated age HEENT: Ears neg, nares patent without D/C, turbinates unremarkable, Pharynx pink without exudate Neck: Supple, no masses or asymmetry, no tenderness Heart: RRR, no bruits, no LE edema Lungs: CTAB, normal effort, no accessory muscle use Psych: Age appropriate judgement and insight, normal mood and affect  Essential hypertension - Plan: amLODipine (NORVASC) 2.5 MG tablet, losartan (COZAAR) 100 MG tablet  Chronic cough - Plan: fluticasone (FLONASE) 50 MCG/ACT nasal spray, levocetirizine (XYZAL) 5 MG tablet  Coronary artery disease involving native coronary artery of native heart without angina pectoris - Plan: ezetimibe (ZETIA) 10 MG tablet, rosuvastatin (CRESTOR) 20 MG tablet  Gross hematuria - Plan: Urinalysis, Routine w reflex microscopic  Reorder blood pressure medicine.  Counseled on diet and exercise. We will treat for upper airway cough syndrome.  We will have a low threshold to obtain PFTs and trial SABA +/- ICS.  He has an appointment with his cardiologist tomorrow. Check urine.  If microscopy positive, will recheck. F/u in 4 weeks. The patient voiced understanding and agreement to the plan.  [place], DO 05/27/18 3:31 PM

## 2018-05-27 NOTE — Patient Instructions (Addendum)
Keep the diet clean and stay active.  Aim to do some physical exertion for 150 minutes per week. This is typically divided into 5 days per week, 30 minutes per day. The activity should be enough to get your heart rate up. Anything is better than nothing if you have time constraints.  Claritin (loratadine), Allegra (fexofenadine), Zyrtec (cetirizine); these are listed in order from weakest to strongest. Generic, and therefore cheaper, options are in the parentheses.   Flonase (fluticasone); nasal spray that is over the counter. 2 sprays each nostril, once daily. Aim towards the same side eye when you spray.  There are available OTC, and the generic versions, which may be cheaper, are in parentheses. Show this to a pharmacist if you have trouble finding any of these items.  We will be in touch regarding your urine results and follow up.   Let us know if you need anything.

## 2018-05-28 ENCOUNTER — Ambulatory Visit (INDEPENDENT_AMBULATORY_CARE_PROVIDER_SITE_OTHER): Payer: 59 | Admitting: Physician Assistant

## 2018-05-28 ENCOUNTER — Encounter: Payer: Self-pay | Admitting: Physician Assistant

## 2018-05-28 VITALS — BP 114/82 | HR 74 | Ht 70.0 in | Wt 258.8 lb

## 2018-05-28 DIAGNOSIS — E785 Hyperlipidemia, unspecified: Secondary | ICD-10-CM

## 2018-05-28 DIAGNOSIS — I2510 Atherosclerotic heart disease of native coronary artery without angina pectoris: Secondary | ICD-10-CM | POA: Diagnosis not present

## 2018-05-28 DIAGNOSIS — I10 Essential (primary) hypertension: Secondary | ICD-10-CM | POA: Diagnosis not present

## 2018-05-28 DIAGNOSIS — I5032 Chronic diastolic (congestive) heart failure: Secondary | ICD-10-CM | POA: Diagnosis not present

## 2018-05-28 DIAGNOSIS — R079 Chest pain, unspecified: Secondary | ICD-10-CM | POA: Diagnosis not present

## 2018-05-28 DIAGNOSIS — I2583 Coronary atherosclerosis due to lipid rich plaque: Secondary | ICD-10-CM

## 2018-05-28 DIAGNOSIS — R002 Palpitations: Secondary | ICD-10-CM

## 2018-05-28 MED ORDER — NITROGLYCERIN 0.4 MG SL SUBL: 0.4000 mg | SUBLINGUAL_TABLET | SUBLINGUAL | 5 refills | Status: AC | PRN

## 2018-05-28 MED ORDER — NITROGLYCERIN 0.4 MG SL SUBL: 0.4000 mg | SUBLINGUAL_TABLET | SUBLINGUAL | 5 refills | Status: DC | PRN

## 2018-05-28 MED ORDER — METOPROLOL TARTRATE 25 MG PO TABS: 25.0000 mg | ORAL_TABLET | Freq: Two times a day (BID) | ORAL | 3 refills | Status: DC

## 2018-05-28 NOTE — Patient Instructions (Signed)
Medication Instructions:  INCREASE; Metoprolol to 25 mg twice a day  If you need a refill on your cardiac medications before your next appointment, please call your pharmacy.   Lab work: FUTURE: LIPIDS, CMET & CBC to be done same day as stress test   If you have labs (blood work) drawn today and your tests are completely normal, you will receive your results only by: Marland Kitchen MyChart Message (if you have MyChart) OR . A paper copy in the mail If you have any lab test that is abnormal or we need to change your treatment, we will call you to review the results.  Testing/Procedures: Your physician has requested that you have an exercise stress myoview. For further information please visit [email or web address]. Please follow instruction sheet, as given.  Your physician has requested that you wear a long term monitor for 2 weeks    Follow-Up: At [facility], you and your health needs are our priority.  As part of our continuing mission to provide you with exceptional heart care, we have created designated Provider Care Teams.  These Care Teams include your primary Cardiologist (physician) and Advanced Practice Providers (APPs -  Physician Assistants and Nurse Practitioners) who all work together to provide you with the care you need, when you need it. You will need a follow up appointment in 3 weeks after testing is done.  Please call our office 2 months in advance to schedule this appointment.  You may see Ena Dawley, MD or one of the following Advanced Practice Providers on your designated Care Team:   [place], PA-C Melina Copa, PA-C . Ermalinda Barrios, PA-C  Any Other Special Instructions Will Be Listed Below (If Applicable).

## 2018-05-28 NOTE — Progress Notes (Addendum)
Cardiology Office Note    Date:  05/28/2018   ID:  [name], DOB 1960/12/29, MRN [id number]  PCP:  Jeffrey Pal, DO  Cardiologist: Jeffrey Dawley, MD EPS: None  Chief Complaint  Patient presents with  . Chest Pain  . Shortness of Breath  . Palpitations    History of Present Illness:  [name] is a 58 y.o. male with history of CAD status post and STEMI treated with DES to circumflex 11/2016 with irregularity in the LAD and RCA but no obstructive lesions, repeat cath 1 week later showed patent stent, follow-up echo 01/2017 LVEF improved to 55 to 60%, chest pain 07/2017 Lexiscan Myoview no ischemia LVEF 48% with fixed perfusion defect of the inferior wall stable.  Patient started on gabapentin for possible neuropathic pain.  Patient called the office 05/23/2018 complaining of intermittent chest pain and was added onto my schedule.ran out of his Plavix a month ago but got some: And has been taking for the past 2 weeks.  Patient says over the past 2 months it's gotten much worse. Left chest pain-pressure, short of breath, heart racing when going up a flight of stairs. Also occurred while driving and he had to pull over. Can last a couple minutes to an hour.Says it's unbearable if he doesn't take the gabapentin. Also takes motrin and lays down. No benefit from NTG-makes it worse. 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 When he goes to the gym his heart feels like it's beating out of his chest and he has to stop. Was exercising 5 days/week but now only 2-3 days. Does cardio for 1 1/2 hrs when he goes.Heart rate used to be in the 60's but now in the 90's at rest.  Also thinks he is retaining fluid and following a low-salt diet.  Patient saw PCP yesterday for hematuria and had a urinalysis done which did not show any RBCs and have a trace of ketones.  Past Medical History:  Diagnosis Date  . BPH (benign prostatic hyperplasia)   . CAD (coronary artery disease)    a. NSTEMI troponin >65 with cath 11/2016 S/p PTCA & DES to mid circumflex; irregularies in LAD & RCA, LVEDP 24, EF 50% and 45-50% by echo.  . Chronic chest pain   . Chronic combined systolic and diastolic CHF (congestive heart failure) ([place])    a. EF 45% in 2018. b. EF 55-60% by echo 01/2017. c. EF 48% by nuc 07/2017.  . CKD (chronic kidney disease), stage II   . Cluster headache    hx of  . Complication of anesthesia    difficult waking up"  . Diverticulosis   . Drug abuse ([place])    hx of  . GERD (gastroesophageal reflux disease)   . Hiatal hernia   . Hyperlipidemia   . Hypertension   . Hypothyroidism   . Nontoxic multinodular goiter   . NSTEMI (non-ST elevated myocardial infarction) ([place])   . Perforated nasal septum   . Pulmonary nodule --- CT 10/19/2014: Nodule is stable, no further routine x-rays 01/06/2009  . TIA (transient ischemic attack)   . Trigeminal neuralgia   . Tubular adenoma of colon 12/2015  . Unspecified asthma(493.90)     Past Surgical History:  Procedure Laterality Date  . APPENDECTOMY    . CARDIAC CATHETERIZATION N/A 11/25/2014   Procedure: Left Heart Cath and Coronary Angiography;  Surgeon: Belva Crome, MD;  Location: [facility];  Service: Cardiovascular;  Laterality: N/A;  .  CARDIAC CATHETERIZATION  12/08/2008   Archie Endo 08/24/2010  . COLECTOMY  ~ 1976   "I had a blockage"  . CORONARY STENT INTERVENTION N/A 11/27/2016   Procedure: CORONARY STENT INTERVENTION;  Surgeon: Belva Crome, MD;  Location: [facility];  Service: Cardiovascular;  Laterality: N/A;  . CORONARY STENT PLACEMENT  11/27/2016   STENT XIENCE ALPINE RX 3.0X15 drug eluting stent was successfully placed  . CYST EXCISION Left 10/31/2016   Jaw  . DESTRUCTION TRIGEMINAL NERVE VIA NEUROLYTIC AGENT  x 3 , R side last ~2010  . KNEE ARTHROSCOPY Right 06/26/2000   Arthroscopy followed by open lateral release/notes 09/06/2010  . LEFT HEART CATH AND CORONARY ANGIOGRAPHY N/A 11/27/2016    Procedure: LEFT HEART CATH AND CORONARY ANGIOGRAPHY;  Surgeon: Belva Crome, MD;  Location: [facility];  Service: Cardiovascular;  Laterality: N/A;  . LEFT HEART CATH AND CORONARY ANGIOGRAPHY N/A 12/07/2016   Procedure: LEFT HEART CATH AND CORONARY ANGIOGRAPHY;  Surgeon: Nelva Bush, MD;  Location: [facility];  Service: Cardiovascular;  Laterality: N/A;  . 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 Ardine Bjork 09/06/2010  . SHOULDER HEMI-ARTHROPLASTY Right 05/26/2010   revision/notes 05/27/2010    Current Medications: Current Meds  Medication Sig  . amLODipine (NORVASC) 2.5 MG tablet Take 1 tablet (2.5 mg total) by mouth daily.  Marland Kitchen aspirin EC 81 MG tablet Take 81 mg by mouth daily.  Marland Kitchen ezetimibe (ZETIA) 10 MG tablet Take 1 tablet (10 mg total) by mouth daily.  . fluticasone (FLONASE) 50 MCG/ACT nasal spray Place 2 sprays into both nostrils daily.  Marland Kitchen gabapentin (NEURONTIN) 300 MG capsule Take 1 capsule (300 mg total) by mouth 2 (two) times daily.  . lansoprazole (PREVACID) 30 MG capsule Take 1 capsule (30 mg total) by mouth 2 (two) times daily before a meal.  . levocetirizine (XYZAL) 5 MG tablet Take 1 tablet (5 mg total) by mouth every evening.  Marland Kitchen losartan (COZAAR) 100 MG tablet TAKE 1 TABLET (100 MG)  BY MOUTH DAILY  . metoprolol tartrate (LOPRESSOR) 25 MG tablet Take 1 tablet (25 mg total) by mouth 2 (two) times daily.  . nitroGLYCERIN (NITROSTAT) 0.4 MG SL tablet Place 1 tablet (0.4 mg total) under the tongue every 5 (five) minutes as needed for chest pain.  Vladimir Faster Glycol-Propyl Glycol (SYSTANE OP) Place 1 drop into both eyes 5 (five) times daily as needed (dry eyes).  . promethazine (PHENERGAN) 12.5 MG tablet Take 12.5 mg by mouth every 6 (six) hours as needed for nausea or vomiting.  . rosuvastatin (CRESTOR) 20 MG tablet Take 1 tablet (20 mg total) by mouth at bedtime.  . [DISCONTINUED] metoprolol tartrate (LOPRESSOR) 25 MG tablet Take 0.5 tablets  (12.5 mg total) by mouth 2 (two) times daily.  . [DISCONTINUED] nitroGLYCERIN (NITROSTAT) 0.4 MG SL tablet Place 1 tablet (0.4 mg total) under the tongue every 5 (five) minutes as needed for chest pain.  . [DISCONTINUED] nitroGLYCERIN (NITROSTAT) 0.4 MG SL tablet Place 1 tablet (0.4 mg total) under the tongue every 5 (five) minutes as needed for chest pain.     Allergies:   Tramadol   Social History   Socioeconomic History  . Marital status: Married    Spouse name: Not on file  . Number of children: 2  . Years of education: Not on file  . Highest education level: Not on file  Occupational History  . Occupation: Merchandiser, retail [employer]  .  Financial resource strain: Not on file  . Food insecurity:    Worry: Not on file    Inability: Not on file  . Transportation needs:    Medical: Not on file    Non-medical: Not on file  Tobacco Use  . Smoking status: Former Smoker    Packs/day: 0.25    Years: 12.00    Pack years: 3.00    Types: Cigarettes    Last attempt to quit: 11/15/2016    Years since quitting: 1.5  . Smokeless tobacco: Never Used  Substance and Sexual Activity  . Alcohol use: Yes    Comment: occ  . Drug use: No  . Sexual activity: Not on file  Lifestyle  . Physical activity:    Days per week: Not on file    Minutes per session: Not on file  . Stress: Not on file  Relationships  . Social connections:    Talks on phone: Not on file    Gets together: Not on file    Attends religious service: Not on file    Active member of club or organization: Not on file    Attends meetings of clubs or organizations: Not on file    Relationship status: Not on file  Other Topics Concern  . Not on file  Social History Narrative   HS[employer]   Culinary school in [place]   Married - '84 - 5 years divorced; remarried '96   1 son - '85               Family History:  The patient's family history includes CAD in his father; Cancer in his father; Diabetes in his  mother; Heart disease in his father and maternal aunt; Heart failure in his father; Hyperlipidemia in his mother; Hypertension in his father and mother; Mesothelioma in his father; Sudden death in his father.   ROS:   Please see the history of present illness.    Review of Systems  Constitution: Positive for weight gain.  HENT: Negative.   Cardiovascular: Positive for chest pain and dyspnea on exertion.  Respiratory: Negative.   Endocrine: Negative.   Hematologic/Lymphatic: Negative.   Musculoskeletal: Negative.   Gastrointestinal: Positive for nausea and vomiting.  Genitourinary: Negative.   Neurological: Negative.   Psychiatric/Behavioral: The patient is nervous/anxious.    All other systems reviewed and are negative.   PHYSICAL EXAM:   VS:  BP 114/82   Pulse 74   Ht 5\' 10"  (1.778 m)   Wt 258 lb 12.8 oz (117.4 kg)   SpO2 91%   BMI 37.13 kg/m   Physical Exam  GEN: Obese, in no acute distress  Neck: no JVD, carotid bruits, or masses Cardiac:RRR; no murmurs, rubs, or gallops  Respiratory:  clear to auscultation bilaterally, normal work of breathing GI: soft, nontender, nondistended, + BS Ext: without cyanosis, clubbing, or edema, Good distal pulses bilaterally Neuro:  Alert and Oriented x 3 Psych: euthymic mood, full affect  Wt Readings from Last 3 Encounters:  05/28/18 258 lb 12.8 oz (117.4 kg)  05/27/18 253 lb (114.8 kg)  07/23/17 245 lb 6.4 oz (111.3 kg)      Studies/Labs Reviewed:   EKG:  EKG is ordered today.  The ekg ordered today demonstrates normal sinus rhythm, incomplete right bundle branch block, inferior Q waves, no acute change  Recent Labs: 05/29/2017: ALT 20 07/22/2017: B Natriuretic Peptide 24.8; Hemoglobin 14.8; Platelets 151 07/23/2017: BUN 14; Creatinine, Ser 1.19; Potassium 3.7; Sodium 136   Lipid  Panel    Component Value Date/Time   CHOL 253 (H) 07/23/2017 0623   CHOL 245 (H) 05/29/2017 0915   TRIG 99 07/23/2017 0623   HDL 67 07/23/2017 0623     HDL 66 05/29/2017 0915   CHOLHDL 3.8 07/23/2017 0623   VLDL 20 07/23/2017 0623   LDLCALC 166 (H) 07/23/2017 0623   LDLCALC 160 (H) 05/29/2017 0915   LDLDIRECT 209.8 07/20/2009 0951    Additional studies/ records that were reviewed today include:  NST 06/05/18 Study Highlights      The left ventricular ejection fraction is moderately decreased (30-44%).  Nuclear stress EF: 43%.  Blood pressure demonstrated a hypertensive response to stress.  There was no ST segment deviation noted during stress.  Defect 1: There is a small defect of mild severity present in the apical inferior and apex location.  Defect 2: There is a small defect of moderate severity present in the apical anterior and apex location.  Defect 3: There is a small defect of moderate severity present in the basal inferolateral location.  Findings consistent with ischemia and prior myocardial infarction with peri-infarct ischemia.  This is an intermediate risk study.   Ejection fraction is reduced with global hypokinesis.  There is a small moderate partially reversible defect in the anteroapex that goes to mild and small moderate partially reversible defect in the basal inferolateral wall that goes to mild.  The suggest possible peri-infarct ischemia.  There is a reversible mild defect in the inferoapex that returns to normal.  This suggests ischemia.  Prior exam noted in medical record documentation is not available for review.        Lexiscan myoview 07/23/2017 IMPRESSION: 1. No reversible ischemia or infarction. Fixed perfusion defect involving the inferior wall is stable, without wall motion abnormality. 2. Global hypokinesis, similar to previous study. Progressive dilatation of the left ventricle. 3. Left ventricular ejection fraction 48% 4. Non invasive risk stratification*: Intermediate  2D echo 10/23/2018Study Conclusions   - Left ventricle: The cavity size was normal. Wall thickness was   normal.  Systolic function was normal. The estimated ejection   fraction was in the range of 55% to 60%. Wall motion was normal;   there were no regional wall motion abnormalities. There was an   increased relative contribution of atrial contraction to   ventricular filling. Doppler parameters are consistent with   abnormal left ventricular relaxation (grade 1 diastolic   dysfunction).   Impressions:   - Normal study.    __ Cardiac catheterization 8/6/18Conclusion   Non-ST elevation myocardial infarction presentation with ongoing chest pain and rising troponin greater than 9.  Total occlusion of the mid circumflex with prolonged pain presentation due to the presence of collaterals.   Successful PCI of the mid circumflex total occlusion reducing the obstruction to less than 20% using a 3.0 Xience Alpine drug-eluting stent to restorie TIMI grade 3 flow. Final postdilatation balloon diameter was 3.5 [place] balloon.  Irregularities are noted in the LAD and right coronary but no significant obstructive lesions are seen.  Acute diastolic heart failure with LVEF 50%, and EDP 24 mmHg.   RECOMMENDATIONS:    Aspirin and Plavix for at least 6 months and preferably 12.  Aggressive risk factor modification.      Cardiac cath 8/16/18Conclusion  Conclusions: 1. Mild to moderate, non-obstructive coronary artery disease similar to conclusion of prior catheterization on 11/27/16. 2. Widely patent LCx/OM stent. 3. Mildly elevated left ventricular filling pressure with LVEF 45%.  Recommendations: 1. Continue DAPT with ASA and clopidogrel x 1 year. 2. Start isosorbide mononitrate (as patient was not taking this medication at home after recent hospital discharge). 3. Begin furosemide for possible component of heart failure. 4. If pain continues despite aforementioned interventions, consider empiric treatment for pericarditis, given recent MI.   Nelva Bush, MD [facility] Pager: [phone]              ASSESSMENT:    1. Chest pain, unspecified type   2. Coronary artery disease due to lipid rich plaque   3. Essential hypertension   4. Chronic diastolic CHF (congestive heart failure) ([place])   5. Dyslipidemia   6. Palpitations      PLAN:  In order of problems listed above:  Chest pain worse over the past 2 months.  He did run out of his Plavix for a month and has restarted it.  Chest pain worse when he misses his gabapentin.  With history of CAD we will proceed with exercise Myoview.  Follow-up with myself or Dr. Meda Coffee after  CAD status post and STEMI treated with DES to the circumflex 11/2016 relook 1 week later patent stent, no obstructive disease in the LAD or RCA.  Palpitations patient says his baseline heart rate is staying in the 90s and it used to be in the 60s.  Will increase metoprolol to 25 mg twice daily and placed 2-week monitor to rule out arrhythmia  Essential hypertension blood pressure is stable  Chronic diastolic CHF patient has a trace of edema.  Follows a low-sodium diet.  Will hold off on diuretic at this time.  Depending on above studies may consider repeat echo.  Hypercholesterolemia on Zetia but has not had lipids checked since it was started.  Will check fasting lipids as well as surveillance labs when he comes back  Addendum: NST 06/05/18 abnormal with 3 defects and ischemia as well as peri-infarct ischemia.Also hypertensive response to exercise. PCP adjusted his meds today. Dr. Meda Coffee reviewed and recommends left cardiac catheterization and possible PCI. I spoke with patient over the phone and he is agreeable to proceed. He hasn't had any chest pain since stress test and was told to go to ED if prolonged chest pain unrelieved with NTG SL. He voices understanding. Cath set up for Tues 06/11/18. I have reviewed the risks, indications, and alternatives to angioplasty and stenting with the patient. Risks include but are not limited to bleeding, infection,  vascular injury, stroke, myocardial infection, arrhythmia, kidney injury, radiation-related injury in the case of prolonged fluoroscopy use, emergency cardiac surgery, and death. The patient understands the risks of serious complication is low (<8%) and patient agrees to proceed.       Medication Adjustments/Labs and Tests Ordered: Current medicines are reviewed at length with the patient today.  Concerns regarding medicines are outlined above.  Medication changes, Labs and Tests ordered today are listed in the Patient Instructions below. Patient Instructions  Medication Instructions:  INCREASE; Metoprolol to 25 mg twice a day  If you need a refill on your cardiac medications before your next appointment, please call your pharmacy.   Lab work: FUTURE: LIPIDS, CMET & CBC to be done same day as stress test   If you have labs (blood work) drawn today and your tests are completely normal, you will receive your results only by: Marland Kitchen MyChart Message (if you have MyChart) OR . A paper copy in the mail If you have any lab test that is abnormal or  we need to change your treatment, we will call you to review the results.  Testing/Procedures: Your physician has requested that you have an exercise stress myoview. For further information please visit [email or web address]. Please follow instruction sheet, as given.  Your physician has requested that you wear a long term monitor for 2 weeks    Follow-Up: At [facility], you and your health needs are our priority.  As part of our continuing mission to provide you with exceptional heart care, we have created designated Provider Care Teams.  These Care Teams include your primary Cardiologist (physician) and Advanced Practice Providers (APPs -  Physician Assistants and Nurse Practitioners) who all work together to provide you with the care you need, when you need it. You will need a follow up appointment in 3 weeks after testing is done.  Please call our  office 2 months in advance to schedule this appointment.  You may see Jeffrey Dawley, MD or one of the following Advanced Practice Providers on your designated Care Team:   [place], PA-C Melina Copa, PA-C . Ermalinda Barrios, PA-C  Any Other Special Instructions Will Be Listed Below (If Applicable).       Signed, Ermalinda Barrios, PA-C  05/28/2018 2:25 PM    [facility] [address], [place], [place]  [address] Phone: [phone]; Fax: [phone]

## 2018-06-03 ENCOUNTER — Telehealth (HOSPITAL_COMMUNITY): Payer: Self-pay | Admitting: *Deleted

## 2018-06-03 NOTE — Telephone Encounter (Signed)
Patient given detailed instructions per Myocardial Perfusion Study Information Sheet for the test on  06/05/18. Patient notified to arrive 15 minutes early and that it is imperative to arrive on time for appointment to keep from having the test rescheduled.  If you need to cancel or reschedule your appointment, please call the office within 24 hours of your appointment. . Patient verbalized understanding. Kirstie Peri

## 2018-06-05 ENCOUNTER — Ambulatory Visit (HOSPITAL_BASED_OUTPATIENT_CLINIC_OR_DEPARTMENT_OTHER): Payer: 59

## 2018-06-05 ENCOUNTER — Other Ambulatory Visit (INDEPENDENT_AMBULATORY_CARE_PROVIDER_SITE_OTHER): Payer: 59

## 2018-06-05 ENCOUNTER — Other Ambulatory Visit: Payer: Self-pay | Admitting: Physician Assistant

## 2018-06-05 VITALS — Ht 70.0 in | Wt 258.0 lb

## 2018-06-05 DIAGNOSIS — R002 Palpitations: Secondary | ICD-10-CM

## 2018-06-05 DIAGNOSIS — I214 Non-ST elevation (NSTEMI) myocardial infarction: Secondary | ICD-10-CM

## 2018-06-05 DIAGNOSIS — R110 Nausea: Secondary | ICD-10-CM | POA: Insufficient documentation

## 2018-06-05 DIAGNOSIS — I2510 Atherosclerotic heart disease of native coronary artery without angina pectoris: Secondary | ICD-10-CM

## 2018-06-05 DIAGNOSIS — R079 Chest pain, unspecified: Secondary | ICD-10-CM

## 2018-06-05 LAB — MYOCARDIAL PERFUSION IMAGING
LV dias vol: 138 mL (ref 62–150)
LV sys vol: 78 mL
Peak HR: 123 {beats}/min
Rest HR: 74 {beats}/min
SDS: 3
SRS: 0
SSS: 3
TID: 1.15

## 2018-06-05 MED ORDER — TECHNETIUM TC 99M TETROFOSMIN IV KIT
10.2000 | PACK | Freq: Once | INTRAVENOUS | Status: AC | PRN
  Administered 2018-06-05: 10.2 via INTRAVENOUS
  Filled 2018-06-05: qty 11

## 2018-06-05 MED ORDER — REGADENOSON 0.4 MG/5ML IV SOLN
0.4000 mg | Freq: Once | INTRAVENOUS | Status: AC
  Administered 2018-06-05: 0.4 mg via INTRAVENOUS

## 2018-06-05 MED ORDER — TECHNETIUM TC 99M TETROFOSMIN IV KIT
31.6000 | PACK | Freq: Once | INTRAVENOUS | Status: AC | PRN
  Administered 2018-06-05: 31.6 via INTRAVENOUS
  Filled 2018-06-05: qty 32

## 2018-06-05 MED ORDER — AMINOPHYLLINE 25 MG/ML IV SOLN
75.0000 mg | Freq: Once | INTRAVENOUS | Status: AC
  Administered 2018-06-05: 75 mg via INTRAVENOUS

## 2018-06-06 ENCOUNTER — Ambulatory Visit: Payer: Self-pay

## 2018-06-06 NOTE — Telephone Encounter (Signed)
Pt. Reports he had a stress test yesterday and during the test his BP went up to 216/130 and his heart rate went up as well. They stopped the test and continued with the "test they give you the medicine instead of walking on the tread mill."  " they kept me until my blood pressure came down and I have a heart monitor on now." Reports he was told to follow up with his PCP. No chest pain or shortness of breath now. Is going to purchase a BP monitor now. Appointment made for tomorrow morning. Instructed if he has chest pain, shortness of breath to go to ED.Verbalizes understanding. Reason for Disposition . Systolic BP  >= 732 OR Diastolic >= 202  Answer Assessment - Initial Assessment Questions 1. BLOOD PRESSURE: "What is the blood pressure?" "Did you take at least two measurements 5 minutes apart?"     Yesterday having a stress test 2. ONSET: "When did you take your blood pressure?"     Yesterday morning 3. HOW: "How did you obtain the blood pressure?" (e.g., visiting nurse, automatic home BP monitor)     During the test 4. HISTORY: "Do you have a history of high blood pressure?"     Yes 5. MEDICATIONS: "Are you taking any medications for blood pressure?" "Have you missed any doses recently?"     No 6. OTHER SYMPTOMS: "Do you have any symptoms?" (e.g., headache, chest pain, blurred vision, difficulty breathing, weakness)     Some chest pain - has a cardiac monitor on today 7. PREGNANCY: "Is there any chance you are pregnant?" "When was your last menstrual period?"     n/a  Protocols used: HIGH BLOOD PRESSURE-A-AH

## 2018-06-07 ENCOUNTER — Ambulatory Visit (INDEPENDENT_AMBULATORY_CARE_PROVIDER_SITE_OTHER): Payer: 59 | Admitting: Family Medicine

## 2018-06-07 ENCOUNTER — Other Ambulatory Visit: Payer: 59 | Admitting: *Deleted

## 2018-06-07 ENCOUNTER — Other Ambulatory Visit: Payer: Self-pay

## 2018-06-07 ENCOUNTER — Encounter: Payer: Self-pay | Admitting: Family Medicine

## 2018-06-07 ENCOUNTER — Inpatient Hospital Stay (HOSPITAL_BASED_OUTPATIENT_CLINIC_OR_DEPARTMENT_OTHER)
Admission: EM | Admit: 2018-06-07 | Discharge: 2018-06-11 | DRG: 247 | Disposition: A | Discharge status: Home / Self Care | Payer: 59 | Attending: Interventional Cardiology | Admitting: Interventional Cardiology

## 2018-06-07 ENCOUNTER — Encounter (HOSPITAL_BASED_OUTPATIENT_CLINIC_OR_DEPARTMENT_OTHER): Payer: Self-pay | Admitting: Emergency Medicine

## 2018-06-07 ENCOUNTER — Emergency Department (HOSPITAL_BASED_OUTPATIENT_CLINIC_OR_DEPARTMENT_OTHER): Payer: 59

## 2018-06-07 ENCOUNTER — Telehealth: Payer: Self-pay | Admitting: Physician Assistant

## 2018-06-07 VITALS — BP 158/104 | HR 74 | Temp 98.0°F | Ht 70.0 in | Wt 253.4 lb

## 2018-06-07 DIAGNOSIS — I25110 Atherosclerotic heart disease of native coronary artery with unstable angina pectoris: Principal | ICD-10-CM | POA: Diagnosis present

## 2018-06-07 DIAGNOSIS — E042 Nontoxic multinodular goiter: Secondary | ICD-10-CM | POA: Diagnosis present

## 2018-06-07 DIAGNOSIS — K219 Gastro-esophageal reflux disease without esophagitis: Secondary | ICD-10-CM | POA: Diagnosis present

## 2018-06-07 DIAGNOSIS — Z79899 Other long term (current) drug therapy: Secondary | ICD-10-CM

## 2018-06-07 DIAGNOSIS — R9439 Abnormal result of other cardiovascular function study: Secondary | ICD-10-CM

## 2018-06-07 DIAGNOSIS — G500 Trigeminal neuralgia: Secondary | ICD-10-CM | POA: Diagnosis present

## 2018-06-07 DIAGNOSIS — Z955 Presence of coronary angioplasty implant and graft: Secondary | ICD-10-CM

## 2018-06-07 DIAGNOSIS — R079 Chest pain, unspecified: Secondary | ICD-10-CM

## 2018-06-07 DIAGNOSIS — Z95820 Peripheral vascular angioplasty status with implants and grafts: Secondary | ICD-10-CM

## 2018-06-07 DIAGNOSIS — Z8679 Personal history of other diseases of the circulatory system: Secondary | ICD-10-CM

## 2018-06-07 DIAGNOSIS — E039 Hypothyroidism, unspecified: Secondary | ICD-10-CM | POA: Diagnosis present

## 2018-06-07 DIAGNOSIS — I5032 Chronic diastolic (congestive) heart failure: Secondary | ICD-10-CM

## 2018-06-07 DIAGNOSIS — I200 Unstable angina: Secondary | ICD-10-CM | POA: Diagnosis present

## 2018-06-07 DIAGNOSIS — R0789 Other chest pain: Secondary | ICD-10-CM | POA: Diagnosis not present

## 2018-06-07 DIAGNOSIS — I252 Old myocardial infarction: Secondary | ICD-10-CM

## 2018-06-07 DIAGNOSIS — I5042 Chronic combined systolic (congestive) and diastolic (congestive) heart failure: Secondary | ICD-10-CM | POA: Diagnosis present

## 2018-06-07 DIAGNOSIS — G8929 Other chronic pain: Secondary | ICD-10-CM | POA: Diagnosis present

## 2018-06-07 DIAGNOSIS — Z7982 Long term (current) use of aspirin: Secondary | ICD-10-CM

## 2018-06-07 DIAGNOSIS — N182 Chronic kidney disease, stage 2 (mild): Secondary | ICD-10-CM | POA: Diagnosis present

## 2018-06-07 DIAGNOSIS — J45909 Unspecified asthma, uncomplicated: Secondary | ICD-10-CM | POA: Diagnosis present

## 2018-06-07 DIAGNOSIS — Z8249 Family history of ischemic heart disease and other diseases of the circulatory system: Secondary | ICD-10-CM

## 2018-06-07 DIAGNOSIS — Z01812 Encounter for preprocedural laboratory examination: Secondary | ICD-10-CM | POA: Diagnosis not present

## 2018-06-07 DIAGNOSIS — Z8673 Personal history of transient ischemic attack (TIA), and cerebral infarction without residual deficits: Secondary | ICD-10-CM

## 2018-06-07 DIAGNOSIS — Z7951 Long term (current) use of inhaled steroids: Secondary | ICD-10-CM

## 2018-06-07 DIAGNOSIS — Z87891 Personal history of nicotine dependence: Secondary | ICD-10-CM

## 2018-06-07 DIAGNOSIS — I10 Essential (primary) hypertension: Secondary | ICD-10-CM

## 2018-06-07 DIAGNOSIS — N400 Enlarged prostate without lower urinary tract symptoms: Secondary | ICD-10-CM | POA: Diagnosis present

## 2018-06-07 DIAGNOSIS — R002 Palpitations: Secondary | ICD-10-CM | POA: Diagnosis not present

## 2018-06-07 DIAGNOSIS — I130 Hypertensive heart and chronic kidney disease with heart failure and stage 1 through stage 4 chronic kidney disease, or unspecified chronic kidney disease: Secondary | ICD-10-CM | POA: Diagnosis present

## 2018-06-07 DIAGNOSIS — Z885 Allergy status to narcotic agent status: Secondary | ICD-10-CM

## 2018-06-07 DIAGNOSIS — E785 Hyperlipidemia, unspecified: Secondary | ICD-10-CM | POA: Diagnosis present

## 2018-06-07 DIAGNOSIS — R911 Solitary pulmonary nodule: Secondary | ICD-10-CM | POA: Diagnosis present

## 2018-06-07 DIAGNOSIS — Z7902 Long term (current) use of antithrombotics/antiplatelets: Secondary | ICD-10-CM

## 2018-06-07 DIAGNOSIS — Z765 Malingerer [conscious simulation]: Secondary | ICD-10-CM

## 2018-06-07 DIAGNOSIS — F4321 Adjustment disorder with depressed mood: Secondary | ICD-10-CM

## 2018-06-07 DIAGNOSIS — Z6835 Body mass index (BMI) 35.0-35.9, adult: Secondary | ICD-10-CM

## 2018-06-07 DIAGNOSIS — E669 Obesity, unspecified: Secondary | ICD-10-CM | POA: Diagnosis present

## 2018-06-07 LAB — BASIC METABOLIC PANEL
Anion gap: 17 — ABNORMAL HIGH (ref 5–15)
BUN/Creatinine Ratio: 13 (ref 9–20)
BUN: 23 mg/dL (ref 6–24)
BUN: 23 mg/dL — ABNORMAL HIGH (ref 6–20)
CO2: 24 mmol/L (ref 20–29)
CO2: 19 mmol/L — ABNORMAL LOW (ref 22–32)
Calcium: 9.5 mg/dL (ref 8.7–10.2)
Calcium: 9.4 mg/dL (ref 8.9–10.3)
Chloride: 98 mmol/L (ref 96–106)
Chloride: 100 mmol/L (ref 98–111)
Creatinine, Ser: 1.76 mg/dL — ABNORMAL HIGH (ref 0.76–1.27)
Creatinine, Ser: 1.58 mg/dL — ABNORMAL HIGH (ref 0.61–1.24)
GFR calc Af Amer: 49 mL/min/{1.73_m2} — ABNORMAL LOW (ref >59)
GFR calc Af Amer: 56 mL/min — ABNORMAL LOW (ref >60)
GFR calc non Af Amer: 43 mL/min/{1.73_m2} — ABNORMAL LOW (ref >59)
GFR calc non Af Amer: 49 mL/min — ABNORMAL LOW (ref >60)
Glucose, Bld: 103 mg/dL — ABNORMAL HIGH (ref 70–99)
Glucose: 108 mg/dL — ABNORMAL HIGH (ref 65–99)
Potassium: 4.1 mmol/L (ref 3.5–5.2)
Potassium: 4 mmol/L (ref 3.5–5.1)
Sodium: 141 mmol/L (ref 134–144)
Sodium: 136 mmol/L (ref 135–145)

## 2018-06-07 LAB — CBC
HCT: 46.5 % (ref 39.0–52.0)
Hematocrit: 43.7 % (ref 37.5–51.0)
Hemoglobin: 15.7 g/dL (ref 13.0–17.7)
Hemoglobin: 15.6 g/dL (ref 13.0–17.0)
MCH: 33.2 pg — ABNORMAL HIGH (ref 26.6–33.0)
MCH: 32.3 pg (ref 26.0–34.0)
MCHC: 35.9 g/dL — ABNORMAL HIGH (ref 31.5–35.7)
MCHC: 33.5 g/dL (ref 30.0–36.0)
MCV: 92 fL (ref 79–97)
MCV: 96.3 fL (ref 80.0–100.0)
Platelets: 136 10*3/uL — ABNORMAL LOW (ref 150–450)
Platelets: 141 10*3/uL — ABNORMAL LOW (ref 150–400)
RBC: 4.73 x10E6/uL (ref 4.14–5.80)
RBC: 4.83 MIL/uL (ref 4.22–5.81)
RDW: 15.1 % (ref 11.6–15.4)
RDW: 14 % (ref 11.5–15.5)
WBC: 6.6 10*3/uL (ref 3.4–10.8)
WBC: 8.6 10*3/uL (ref 4.0–10.5)
nRBC: 0 % (ref 0.0–0.2)

## 2018-06-07 LAB — TROPONIN I: Troponin I: 0.03 ng/mL (ref <0.03)

## 2018-06-07 MED ORDER — AMLODIPINE BESYLATE 10 MG PO TABS: 10.0000 mg | ORAL_TABLET | Freq: Every day | ORAL | 3 refills | Status: DC

## 2018-06-07 MED ORDER — NITROGLYCERIN 0.4 MG SL SUBL
0.4000 mg | SUBLINGUAL_TABLET | SUBLINGUAL | Status: AC | PRN
  Administered 2018-06-08 (×3): 0.4 mg via SUBLINGUAL
  Filled 2018-06-07 (×2): qty 1

## 2018-06-07 MED ORDER — ASPIRIN 81 MG PO CHEW
324.0000 mg | CHEWABLE_TABLET | Freq: Once | ORAL | Status: AC
  Administered 2018-06-08: 324 mg via ORAL
  Filled 2018-06-07: qty 4

## 2018-06-07 MED ORDER — MORPHINE SULFATE (PF) 4 MG/ML IV SOLN
4.0000 mg | Freq: Once | INTRAVENOUS | Status: AC
  Administered 2018-06-08: 4 mg via INTRAVENOUS
  Filled 2018-06-07: qty 1

## 2018-06-07 MED ORDER — SODIUM CHLORIDE 0.9% FLUSH
3.0000 mL | Freq: Once | INTRAVENOUS | Status: DC
  Filled 2018-06-07: qty 3

## 2018-06-07 MED ORDER — FLUOXETINE HCL 20 MG PO TABS: 20.0000 mg | ORAL_TABLET | Freq: Every day | ORAL | 3 refills | Status: DC

## 2018-06-07 NOTE — ED Notes (Signed)
ED Provider at bedside. 

## 2018-06-07 NOTE — Telephone Encounter (Signed)
Left message for patient to call back  

## 2018-06-07 NOTE — Progress Notes (Signed)
Chief Complaint  Patient presents with  . Hypertension    Subjective [name] is a 59 y.o. male who presents for hypertension follow up. He was at stress test 2 days ago and it was high.  He does not check his blood pressures at home but did get a monitor today. He is compliant with medications- Norvasc 2.5 mg/d, losartan 100 mg/d. Patient has these side effects of medication: none He is adhering to a healthy diet overall. Current exercise: none  Patient is been dealing with situational depression.  He has been more emotional as of late.  His wife notices the change more than anyone.  This is been going on for several months.  He does attribute his health to be one of the stressors.  He is not following with a counselor or psychologist.  Work tends to be stressful.  He has been on Zoloft in the past, but affected his work.  He felt that he had less drive.  No thoughts of harming himself or others.  Feels tightness in his neck, sensation from trig nerve destruction returning.  No recent injury.  He has not been sick recently. There is no pain. No ST.   Past Medical History:  Diagnosis Date  . BPH (benign prostatic hyperplasia)   . CAD (coronary artery disease)    a. NSTEMI troponin >65 with cath 11/2016 S/p PTCA & DES to mid circumflex; irregularies in LAD & RCA, LVEDP 24, EF 50% and 45-50% by echo.  . Chronic chest pain   . Chronic combined systolic and diastolic CHF (congestive heart failure) ([place])    a. EF 45% in 2018. b. EF 55-60% by echo 01/2017. c. EF 48% by nuc 07/2017.  . CKD (chronic kidney disease), stage II   . Cluster headache    hx of  . Complication of anesthesia    difficult waking up"  . Diverticulosis   . Drug abuse ([place])    hx of  . GERD (gastroesophageal reflux disease)   . Hiatal hernia   . Hyperlipidemia   . Hypertension   . Hypothyroidism   . Nontoxic multinodular goiter   . NSTEMI (non-ST elevated myocardial infarction) ([place])   . Perforated nasal  septum   . Pulmonary nodule --- CT 10/19/2014: Nodule is stable, no further routine x-rays 01/06/2009  . TIA (transient ischemic attack)   . Trigeminal neuralgia   . Tubular adenoma of colon 12/2015  . Unspecified asthma(493.90)     Review of Systems Cardiovascular: no chest pain Respiratory:  no shortness of breath  Exam BP (!) 158/104 (BP Location: Left Arm, Patient Position: Sitting, Cuff Size: Large)   Pulse 74   Temp 98 F (36.7 C) (Oral)   Ht 5\' 10"  (1.778 m)   Wt 253 lb 6 oz (114.9 kg)   SpO2 97%   BMI 36.36 kg/m  General:  well developed, well nourished, in no apparent distress HEENT: MMM, no erythema or exudate in pharynx, no asymmetry, submand glands appreciated b/l, reproducing discomfort experienced by pt.  Heart: RRR, no bruits, no LE edema Lungs: clear to auscultation, no accessory muscle use Psych: well oriented with normal range of affect and appropriate judgment/insight  Essential hypertension - Plan: amLODipine (NORVASC) 10 MG tablet  Situational depression - Plan: FLUoxetine (PROZAC) 20 MG tablet  Continue losartan.  Increase dose of amlodipine from 2.5 mg daily to 10 mg daily.  Counseled on diet and exercise.  Decrease sodium intake. Start Prozac daily.  Half tab for  the first 2 weeks.  Number for counseling given. F/u in 2 weeks to reck BP. The patient voiced understanding and agreement to the plan.  [place], DO 06/07/18  11:59 AM

## 2018-06-07 NOTE — Telephone Encounter (Signed)
Patient called back. Made him aware of abnormal stress test and that Selinda Eon reviewed with Dr. Meda Coffee and we will need to arrange for a cath. Patient states that he would be fine with speaking with Selinda Eon over the phone and will come in for labs. Made patient aware that Selinda Eon would be calling him and we will arrange for cath next week. Patient verbalized understanding.

## 2018-06-07 NOTE — ED Triage Notes (Signed)
Pt having heart palpitation since 6 pm not getting better with 7/10 cp, pt is scheduled to have a hearth cath this week.

## 2018-06-07 NOTE — Telephone Encounter (Signed)
-----   Message from Imogene Burn, PA-C sent at 06/07/2018  8:50 AM EST ----- [place], I just tried calling Mr. Iten to tell him Dr. Meda Coffee wants me to schedule him for a cath because of abnormal NST. I asked him to call the office and ask for you. I also saw he called yest because of elevated BP and he was directed to PCP?? Anyway, I'm happy to speak with him over the phone about the cath since I just saw him on the 4th and addend my note or we can add him onto my schedule next week and I can address BP and go over cath with him. It may have been high b/c he held his lopressor.

## 2018-06-07 NOTE — Telephone Encounter (Signed)
Preliminary pre-cath labs reviewed by nurse Cr- 1.76 GFR-49 Patient on losartan 100 mg QD Discussed with Ermalinda Barrios, PA and since patient has been hypertensive, patient will only take 1/2 tablet the day before the procedure and will hold the morning of the procedure. Called and amde the patient aware of results and reviewed new pre-cath instructions with the patient. Patient verbalized understanding and thanked me for the call.   [address] [place] [address] Dept: [phone] Loc: [place]                   06/07/2018  You are scheduled for a Cardiac Catheterization on Tuesday, February 18 with Dr. Larae Grooms.    1. Please arrive at the [facility] (Main Entrance A) at [facility]: [address] [place], [place] [address] at 10:00 AM (This time is two hours before your procedure to ensure your preparation). Free valet parking service is available.   Special note: Every effort is made to have your procedure done on time. Please understand that emergencies sometimes delay scheduled procedures.  2. Diet: Do not eat solid foods after midnight.  The patient may have clear liquids until 5am upon the day of the procedure.  3. Labs: You will need to have blood drawn on Friday, February 14 at [facility] at [facility]. [address]  Open: 7:30am - 5pm    Phone: [phone]. You do not need to be fasting.  4. Medication instructions in preparation for your procedure:   Contrast Allergy: No  Take 1/2 tablet of losartan the day before your procedure.  DO NOT take losartan the morning of the procedure.  On the morning of your procedure, take your Aspirin and Plavix/Clopidogrel and any of your other regular morning medicines NOT listed above. You may use sips of water.  5. Plan for one night stay--bring  personal belongings. 6. Bring a current list of your medications and current insurance cards. 7. You MUST have a responsible person to drive you home. 8. Someone MUST be with you the first 24 hours after you arrive home or your discharge will be delayed. 9. Please wear clothes that are easy to get on and off and wear slip-on shoes.  Thank you for allowing [place] to care for you!   --  Invasive Cardiovascular services

## 2018-06-07 NOTE — Patient Instructions (Addendum)
Please consider counseling. Contact [phone] to schedule an appointment or inquire about cost/insurance coverage.  Around 3 times per week, check your blood pressure 4 times per day. Twice in the morning and twice in the evening. The readings should be at least one minute apart. Write down these values and bring them to your next nurse visit/appointment.  When you check your BP, make sure you have been doing something calm/relaxing 5 minutes prior to checking. Both feet should be flat on the floor and you should be sitting. Use your left arm and make sure it is in a relaxed position (on a table), and that the cuff is at the approximate level/height of your heart.  Keep the diet clean and stay active.  Cut back on the soups. These are high in sodium and can contribute to higher blood pressures.   I am feeling your submandibular glands in your neck. These are normal and will likely go down on their own.   Let us know if you need anything.

## 2018-06-07 NOTE — Telephone Encounter (Signed)
[name] has explained the risks and benefits of the procedure to the patient and he would like to proceed with LHC. LHC scheduled with Dr. Irish Lack on 06/11/18 at 12:00 PM. Patient is coming in for labs today. Instruction letter below will be given to the patient when he comes in for labs.      [place] [address] Dept: [phone] Loc: [place]  06/07/2018  You are scheduled for a Cardiac Catheterization on Tuesday, February 18 with Dr. Larae Grooms.  1. Please arrive at the [facility] (Main Entrance A) at [facility]: [address] [place],  [address] at 10:00 AM (This time is two hours before your procedure to ensure your preparation). Free valet parking service is available.   Special note: Every effort is made to have your procedure done on time. Please understand that emergencies sometimes delay scheduled procedures.  2. Diet: Do not eat solid foods after midnight.  The patient may have clear liquids until 5am upon the day of the procedure.  3. Labs: You will need to have blood drawn on Friday, February 14 at [facility] at [facility]. [address]  Open: 7:30am - 5pm    Phone: [phone]. You do not need to be fasting.  4. Medication instructions in preparation for your procedure:   Contrast Allergy: No  On the morning of your procedure, take your Aspirin and Plavix/Clopidogrel and any of your other regular morning medicines. You may use sips of water.  5. Plan for one night stay--bring personal belongings. 6. Bring a current list of your medications and current insurance cards. 7. You MUST have a responsible person to drive you home. 8. Someone MUST be with you the first 24 hours after you arrive home or your discharge will be delayed. 9. Please wear clothes that are easy to get on and  off and wear slip-on shoes.  Thank you for allowing [place] to care for you!   -- [place] [facility]

## 2018-06-07 NOTE — ED Notes (Signed)
Patient transported to X-ray 

## 2018-06-07 NOTE — Telephone Encounter (Signed)
Patient called for results to his stress test.

## 2018-06-08 DIAGNOSIS — Z95820 Peripheral vascular angioplasty status with implants and grafts: Secondary | ICD-10-CM

## 2018-06-08 DIAGNOSIS — R002 Palpitations: Secondary | ICD-10-CM | POA: Diagnosis not present

## 2018-06-08 DIAGNOSIS — Z8673 Personal history of transient ischemic attack (TIA), and cerebral infarction without residual deficits: Secondary | ICD-10-CM | POA: Diagnosis not present

## 2018-06-08 DIAGNOSIS — G8929 Other chronic pain: Secondary | ICD-10-CM | POA: Diagnosis not present

## 2018-06-08 DIAGNOSIS — G500 Trigeminal neuralgia: Secondary | ICD-10-CM | POA: Diagnosis present

## 2018-06-08 DIAGNOSIS — I252 Old myocardial infarction: Secondary | ICD-10-CM | POA: Diagnosis not present

## 2018-06-08 DIAGNOSIS — R0789 Other chest pain: Secondary | ICD-10-CM | POA: Diagnosis not present

## 2018-06-08 DIAGNOSIS — I200 Unstable angina: Secondary | ICD-10-CM

## 2018-06-08 DIAGNOSIS — I5042 Chronic combined systolic (congestive) and diastolic (congestive) heart failure: Secondary | ICD-10-CM | POA: Diagnosis not present

## 2018-06-08 DIAGNOSIS — Z6835 Body mass index (BMI) 35.0-35.9, adult: Secondary | ICD-10-CM | POA: Diagnosis not present

## 2018-06-08 DIAGNOSIS — Z87891 Personal history of nicotine dependence: Secondary | ICD-10-CM | POA: Diagnosis not present

## 2018-06-08 DIAGNOSIS — Z7902 Long term (current) use of antithrombotics/antiplatelets: Secondary | ICD-10-CM | POA: Diagnosis not present

## 2018-06-08 DIAGNOSIS — Z8249 Family history of ischemic heart disease and other diseases of the circulatory system: Secondary | ICD-10-CM | POA: Diagnosis not present

## 2018-06-08 DIAGNOSIS — K219 Gastro-esophageal reflux disease without esophagitis: Secondary | ICD-10-CM | POA: Diagnosis present

## 2018-06-08 DIAGNOSIS — E042 Nontoxic multinodular goiter: Secondary | ICD-10-CM | POA: Diagnosis not present

## 2018-06-08 DIAGNOSIS — J45909 Unspecified asthma, uncomplicated: Secondary | ICD-10-CM | POA: Diagnosis present

## 2018-06-08 DIAGNOSIS — N182 Chronic kidney disease, stage 2 (mild): Secondary | ICD-10-CM | POA: Diagnosis not present

## 2018-06-08 DIAGNOSIS — Z7982 Long term (current) use of aspirin: Secondary | ICD-10-CM | POA: Diagnosis not present

## 2018-06-08 DIAGNOSIS — I5032 Chronic diastolic (congestive) heart failure: Secondary | ICD-10-CM

## 2018-06-08 DIAGNOSIS — I130 Hypertensive heart and chronic kidney disease with heart failure and stage 1 through stage 4 chronic kidney disease, or unspecified chronic kidney disease: Secondary | ICD-10-CM | POA: Diagnosis not present

## 2018-06-08 DIAGNOSIS — E669 Obesity, unspecified: Secondary | ICD-10-CM | POA: Diagnosis present

## 2018-06-08 DIAGNOSIS — Z8679 Personal history of other diseases of the circulatory system: Secondary | ICD-10-CM | POA: Diagnosis not present

## 2018-06-08 DIAGNOSIS — Z955 Presence of coronary angioplasty implant and graft: Secondary | ICD-10-CM | POA: Diagnosis not present

## 2018-06-08 DIAGNOSIS — E785 Hyperlipidemia, unspecified: Secondary | ICD-10-CM | POA: Diagnosis not present

## 2018-06-08 DIAGNOSIS — Z765 Malingerer [conscious simulation]: Secondary | ICD-10-CM | POA: Diagnosis not present

## 2018-06-08 DIAGNOSIS — R079 Chest pain, unspecified: Secondary | ICD-10-CM | POA: Diagnosis not present

## 2018-06-08 DIAGNOSIS — N400 Enlarged prostate without lower urinary tract symptoms: Secondary | ICD-10-CM | POA: Diagnosis present

## 2018-06-08 DIAGNOSIS — Z885 Allergy status to narcotic agent status: Secondary | ICD-10-CM | POA: Diagnosis not present

## 2018-06-08 DIAGNOSIS — R911 Solitary pulmonary nodule: Secondary | ICD-10-CM | POA: Diagnosis present

## 2018-06-08 DIAGNOSIS — E039 Hypothyroidism, unspecified: Secondary | ICD-10-CM | POA: Diagnosis not present

## 2018-06-08 DIAGNOSIS — I25110 Atherosclerotic heart disease of native coronary artery with unstable angina pectoris: Secondary | ICD-10-CM | POA: Diagnosis not present

## 2018-06-08 LAB — LIPID PANEL
Cholesterol: 292 mg/dL — ABNORMAL HIGH (ref 0–200)
HDL: 110 mg/dL (ref >40)
LDL Cholesterol: 174 mg/dL — ABNORMAL HIGH (ref 0–99)
Total CHOL/HDL Ratio: 2.7 RATIO
Triglycerides: 39 mg/dL (ref <150)
VLDL: 8 mg/dL (ref 0–40)

## 2018-06-08 LAB — HEPARIN LEVEL (UNFRACTIONATED): Heparin Unfractionated: 0.44 IU/mL (ref 0.30–0.70)

## 2018-06-08 LAB — TROPONIN I
Troponin I: <0.03 ng/mL (ref <0.03)
Troponin I: <0.03 ng/mL (ref <0.03)
Troponin I: <0.03 ng/mL (ref <0.03)

## 2018-06-08 MED ORDER — DICYCLOMINE HCL 10 MG PO CAPS
10.0000 mg | ORAL_CAPSULE | Freq: Once | ORAL | Status: AC
  Administered 2018-06-08: 10 mg via ORAL
  Filled 2018-06-08: qty 1

## 2018-06-08 MED ORDER — FLUTICASONE PROPIONATE 50 MCG/ACT NA SUSP
2.0000 | Freq: Every day | NASAL | Status: DC
  Administered 2018-06-08 – 2018-06-09 (×2): 2 via NASAL
  Filled 2018-06-08: qty 16

## 2018-06-08 MED ORDER — FLUOXETINE HCL 10 MG PO CAPS
10.0000 mg | ORAL_CAPSULE | Freq: Every day | ORAL | Status: DC
  Administered 2018-06-08 – 2018-06-11 (×4): 10 mg via ORAL
  Filled 2018-06-08 (×4): qty 1

## 2018-06-08 MED ORDER — PROMETHAZINE HCL 25 MG PO TABS
12.5000 mg | ORAL_TABLET | Freq: Four times a day (QID) | ORAL | Status: DC | PRN
  Administered 2018-06-08 – 2018-06-11 (×8): 12.5 mg via ORAL
  Filled 2018-06-08 (×8): qty 1

## 2018-06-08 MED ORDER — LORATADINE 10 MG PO TABS
10.0000 mg | ORAL_TABLET | Freq: Every evening | ORAL | Status: DC
  Administered 2018-06-08 – 2018-06-10 (×3): 10 mg via ORAL
  Filled 2018-06-08 (×3): qty 1

## 2018-06-08 MED ORDER — IBUPROFEN 200 MG PO TABS: 400.0000 mg | ORAL_TABLET | Freq: Three times a day (TID) | ORAL | Status: DC | PRN

## 2018-06-08 MED ORDER — HEPARIN (PORCINE) 25000 UT/250ML-% IV SOLN
1350.0000 [IU]/h | INTRAVENOUS | Status: DC
  Administered 2018-06-08 (×3): 1300 [IU]/h via INTRAVENOUS
  Administered 2018-06-09 – 2018-06-10 (×2): 1350 [IU]/h via INTRAVENOUS
  Filled 2018-06-08 (×4): qty 250

## 2018-06-08 MED ORDER — NITROGLYCERIN IN D5W 200-5 MCG/ML-% IV SOLN
2.0000 ug/min | INTRAVENOUS | Status: DC
  Administered 2018-06-08: 10 ug/min via INTRAVENOUS
  Administered 2018-06-08: 60 ug/min via INTRAVENOUS
  Administered 2018-06-09: 65 ug/min via INTRAVENOUS
  Administered 2018-06-09: 60 ug/min via INTRAVENOUS
  Administered 2018-06-10: 70 ug/min via INTRAVENOUS
  Filled 2018-06-08 (×5): qty 250

## 2018-06-08 MED ORDER — HEPARIN BOLUS VIA INFUSION
4000.0000 [IU] | Freq: Once | INTRAVENOUS | Status: AC
  Administered 2018-06-08: 4000 [IU] via INTRAVENOUS
  Filled 2018-06-08: qty 4000

## 2018-06-08 MED ORDER — ALUM & MAG HYDROXIDE-SIMETH 200-200-20 MG/5ML PO SUSP
30.0000 mL | Freq: Once | ORAL | Status: AC
  Administered 2018-06-08: 30 mL via ORAL
  Filled 2018-06-08: qty 30

## 2018-06-08 MED ORDER — ROSUVASTATIN CALCIUM 20 MG PO TABS
40.0000 mg | ORAL_TABLET | Freq: Every day | ORAL | Status: DC
  Administered 2018-06-08 – 2018-06-10 (×3): 40 mg via ORAL
  Filled 2018-06-08 (×3): qty 2

## 2018-06-08 MED ORDER — ROSUVASTATIN CALCIUM 20 MG PO TABS: 20.0000 mg | ORAL_TABLET | Freq: Every day | ORAL | Status: DC

## 2018-06-08 MED ORDER — CARVEDILOL 6.25 MG PO TABS
6.2500 mg | ORAL_TABLET | Freq: Two times a day (BID) | ORAL | Status: DC
  Administered 2018-06-08: 6.25 mg via ORAL
  Filled 2018-06-08 (×2): qty 1

## 2018-06-08 MED ORDER — ACETAMINOPHEN 325 MG PO TABS
650.0000 mg | ORAL_TABLET | ORAL | Status: DC | PRN
  Administered 2018-06-08: 650 mg via ORAL
  Filled 2018-06-08: qty 2

## 2018-06-08 MED ORDER — PANTOPRAZOLE SODIUM 40 MG PO TBEC
40.0000 mg | DELAYED_RELEASE_TABLET | Freq: Every day | ORAL | Status: DC
  Administered 2018-06-08 – 2018-06-11 (×4): 40 mg via ORAL
  Filled 2018-06-08 (×4): qty 1

## 2018-06-08 MED ORDER — HYDRALAZINE HCL 20 MG/ML IJ SOLN
10.0000 mg | Freq: Once | INTRAMUSCULAR | Status: AC
  Administered 2018-06-08: 10 mg via INTRAVENOUS
  Filled 2018-06-08: qty 1

## 2018-06-08 MED ORDER — LEVOCETIRIZINE DIHYDROCHLORIDE 5 MG PO TABS: 5.0000 mg | ORAL_TABLET | Freq: Every evening | ORAL | Status: DC

## 2018-06-08 MED ORDER — NITROGLYCERIN 0.4 MG SL SUBL: 0.4000 mg | SUBLINGUAL_TABLET | SUBLINGUAL | Status: DC | PRN

## 2018-06-08 MED ORDER — KETOROLAC TROMETHAMINE 30 MG/ML IJ SOLN
30.0000 mg | Freq: Three times a day (TID) | INTRAMUSCULAR | Status: DC | PRN
  Administered 2018-06-08 – 2018-06-09 (×4): 30 mg via INTRAVENOUS
  Filled 2018-06-08 (×4): qty 1

## 2018-06-08 MED ORDER — LOSARTAN POTASSIUM 50 MG PO TABS
100.0000 mg | ORAL_TABLET | Freq: Every day | ORAL | Status: DC
  Administered 2018-06-08 – 2018-06-11 (×4): 100 mg via ORAL
  Filled 2018-06-08 (×4): qty 2

## 2018-06-08 MED ORDER — EZETIMIBE 10 MG PO TABS
10.0000 mg | ORAL_TABLET | Freq: Every day | ORAL | Status: DC
  Administered 2018-06-08 – 2018-06-11 (×4): 10 mg via ORAL
  Filled 2018-06-08 (×4): qty 1

## 2018-06-08 MED ORDER — ALUM & MAG HYDROXIDE-SIMETH 200-200-20 MG/5ML PO SUSP
30.0000 mL | Freq: Four times a day (QID) | ORAL | Status: DC | PRN
  Administered 2018-06-08: 30 mL via ORAL
  Filled 2018-06-08: qty 30

## 2018-06-08 MED ORDER — CARVEDILOL 6.25 MG PO TABS
9.3750 mg | ORAL_TABLET | Freq: Two times a day (BID) | ORAL | Status: DC
  Administered 2018-06-08 – 2018-06-10 (×5): 9.375 mg via ORAL
  Filled 2018-06-08 (×4): qty 1

## 2018-06-08 MED ORDER — GABAPENTIN 300 MG PO CAPS
300.0000 mg | ORAL_CAPSULE | Freq: Two times a day (BID) | ORAL | Status: DC
  Administered 2018-06-08 – 2018-06-11 (×7): 300 mg via ORAL
  Filled 2018-06-08 (×7): qty 1

## 2018-06-08 MED ORDER — ONDANSETRON HCL 4 MG/2ML IJ SOLN: 4.0000 mg | Freq: Four times a day (QID) | INTRAMUSCULAR | Status: DC | PRN

## 2018-06-08 MED ORDER — ASPIRIN EC 81 MG PO TBEC
81.0000 mg | DELAYED_RELEASE_TABLET | Freq: Every day | ORAL | Status: DC
  Administered 2018-06-08 – 2018-06-09 (×2): 81 mg via ORAL
  Filled 2018-06-08 (×2): qty 1

## 2018-06-08 MED ORDER — CLOPIDOGREL BISULFATE 75 MG PO TABS
75.0000 mg | ORAL_TABLET | Freq: Every day | ORAL | Status: DC
  Administered 2018-06-08 – 2018-06-10 (×3): 75 mg via ORAL
  Filled 2018-06-08 (×3): qty 1

## 2018-06-08 MED ORDER — AMLODIPINE BESYLATE 10 MG PO TABS
10.0000 mg | ORAL_TABLET | Freq: Every day | ORAL | Status: DC
  Administered 2018-06-08 – 2018-06-11 (×4): 10 mg via ORAL
  Filled 2018-06-08 (×4): qty 1

## 2018-06-08 MED ORDER — RANOLAZINE ER 500 MG PO TB12
500.0000 mg | ORAL_TABLET | Freq: Two times a day (BID) | ORAL | Status: DC
  Administered 2018-06-08 – 2018-06-11 (×8): 500 mg via ORAL
  Filled 2018-06-08 (×8): qty 1

## 2018-06-08 NOTE — Progress Notes (Signed)
Patient c/o nausea, given 12.5mg  phenergan.  Continues to have chest pain, unchanged since arrival and NTG gtt started, now at 65mcg/min.  Cardiology PA paged.

## 2018-06-08 NOTE — ED Notes (Signed)
ED Provider at bedside. Aware of pt's chest pain 7/10. Pt appears comfortable.

## 2018-06-08 NOTE — Progress Notes (Signed)
Progress Note  Patient Name: [name] Date of Encounter: 06/08/2018   Primary Cardiologist: Ena Dawley, MD    Subjective   Asked to see patient with ongoing chest pain Pain is atypical for angina and not releived With iv nitro Seems comfortable ECG and troponin's are non acute   Inpatient Medications    Scheduled Meds: . amLODipine  10 mg Oral Daily  . aspirin EC  81 mg Oral Daily  . carvedilol  9.375 mg Oral BID WC  . clopidogrel  75 mg Oral Daily  . ezetimibe  10 mg Oral Daily  . FLUoxetine  10 mg Oral Daily  . fluticasone  2 spray Each Nare Daily  . gabapentin  300 mg Oral BID  . loratadine  10 mg Oral QPM  . losartan  100 mg Oral Daily  . pantoprazole  40 mg Oral Daily  . ranolazine  500 mg Oral BID  . rosuvastatin  40 mg Oral QHS  . sodium chloride flush  3 mL Intravenous Once   Continuous Infusions: . heparin Stopped (06/08/18 0416)  . nitroGLYCERIN 60 mcg/min (06/08/18 0800)   PRN Meds: acetaminophen, ibuprofen, nitroGLYCERIN, ondansetron (ZOFRAN) IV, promethazine   Vital Signs    Vitals:   06/08/18 0850 06/08/18 0931 06/08/18 1012 06/08/18 1013  BP:  119/83 111/90 111/90  Pulse:   87   Resp:      Temp:      TempSrc:      SpO2: 97%     Weight:      Height:        Intake/Output Summary (Last 24 hours) at 06/08/2018 1017 Last data filed at 06/08/2018 0800 Gross per 24 hour  Intake 568.16 ml  Output 100 ml  Net 468.16 ml   Last 3 Weights 06/08/2018 06/07/2018 06/07/2018  Weight (lbs) 249 lb 3.2 oz 253 lb 6 oz 253 lb 6 oz  Weight (kg) 113.036 kg 114.93 kg 114.93 kg      Telemetry    NSR  - Personally Reviewed  ECG    NSR no acute ST changes  - Personally Reviewed  Physical Exam  Overweight black male  GEN: No acute distress.   Neck: No JVD Cardiac: RRR, no murmurs, rubs, or gallops.  Respiratory: Clear to auscultation bilaterally. GI: Soft, nontender, non-distended  MS: No edema; No deformity. Neuro:  Nonfocal  Psych:  Normal affect   Labs    Chemistry Recent Labs  Lab 06/07/18 1443 06/07/18 2256  NA 141 136  K 4.1 4.0  CL 98 100  CO2 24 19*  GLUCOSE 108* 103*  BUN 23 23*  CREATININE 1.76* 1.58*  CALCIUM 9.5 9.4  GFRNONAA 43* 49*  GFRAA 49* 56*  ANIONGAP  --  17*     Hematology Recent Labs  Lab 06/07/18 1443 06/07/18 2256  WBC 6.6 8.6  RBC 4.73 4.83  HGB 15.7 15.6  HCT 43.7 46.5  MCV 92 96.3  MCH 33.2* 32.3  MCHC 35.9* 33.5  RDW 15.1 14.0  PLT 136* 141*    Cardiac Enzymes Recent Labs  Lab 06/07/18 2256 06/08/18 0315  TROPONINI 0.03* <0.03   No results for input(s): TROPIPOC in the last 168 hours.   BNPNo results for input(s): BNP, PROBNP in the last 168 hours.   DDimer No results for input(s): DDIMER in the last 168 hours.   Radiology    Dg Chest 2 View  Result Date: 06/07/2018 CLINICAL DATA:  Heart palpitations EXAM: CHEST - 2 VIEW  COMPARISON:  Chest radiograph 07/22/2017 FINDINGS: Mild cardiomegaly. No focal airspace consolidation or pulmonary edema. No pleural effusion or pneumothorax. IMPRESSION: No active cardiopulmonary disease. Electronically Signed   By: Ulyses Jarred M.D.   On: 06/07/2018 23:05    Cardiac Studies   06/05/18 Myovue    The left ventricular ejection fraction is moderately decreased (30-44%).  Nuclear stress EF: 43%.  Blood pressure demonstrated a hypertensive response to stress.  There was no ST segment deviation noted during stress.  Defect 1: There is a small defect of mild severity present in the apical inferior and apex location.  Defect 2: There is a small defect of moderate severity present in the apical anterior and apex location.  Defect 3: There is a small defect of moderate severity present in the basal inferolateral location.  Findings consistent with ischemia and prior myocardial infarction with peri-infarct ischemia.  This is an intermediate risk study.   Ejection fraction is reduced with global hypokinesis.  There is  a small moderate partially reversible defect in the anteroapex that goes to mild and small moderate partially reversible defect in the basal inferolateral wall that goes to mild.  The suggest possible peri-infarct ischemia.  There is a reversible mild defect in the inferoapex that returns to normal.  This suggests ischemia.  Prior exam noted in medical record documentation is not available for review.  Patient Profile     [name] is a 58 y.o. male with history of HTN, Chronic HFpef, DLD, CAD status post and STEMI treated with DES to circumflex 11/2016 with irregularityin the LAD and RCA but no obstructive lesions, repeat cath 1 week later showed patent stent, follow-up echo 01/2017 LVEF improved to 55 to 60%, chest pain 07/2017 Lexiscan Myoview no ischemia LVEF 48% with fixed perfusion defect of the inferior wall stable  Assessment & Plan    Chest Pain:  Not clear that he is having acute coronary syndrome Prolonged pain but negative troponin x 3 and no acute ECG changes.  Had DES to circumflex August 2018 without significant disease in RCA and LAD. Myovue 07/23/17 considered low risk but did have peri infarct ischemia see above. Continue heparin and iv nitro Avoid MSO4 ? Drug seeking behavior can use Toradol for pain Cath scheduled for Monday  Critical Care Time:  Time spent reviewing chart examining patient and discussing care with nurse 30 minutes       For questions or updates, please contact [place] Please consult [email or web address] for contact info under        Signed, Jenkins Rouge, MD  06/08/2018, 10:17 AM

## 2018-06-08 NOTE — H&P (Signed)
Cardiology Admission History and Physical:   Patient ID: [name] MRN: [id number]; DOB: 02-01-1961   Admission date: 06/07/2018  Primary Care Provider: [name], DO Primary Cardiologist: Ena Dawley, MD  Primary Electrophysiologist:  None   Chief Complaint:  Chest pain  Patient Profile:   [name] is a 58 y.o. male with history of HTN, Chronic HFpef, DLD, CAD status post and STEMI treated with DES to circumflex 11/2016 with irregularity in the LAD and RCA but no obstructive lesions, repeat cath 1 week later showed patent stent, follow-up echo 01/2017 LVEF improved to 55 to 60%, chest pain 07/2017 Lexiscan Myoview no ischemia LVEF 48% with fixed perfusion defect of the inferior wall stable.  History of Present Illness:   [name] has the above history with recent nuclear stress status below which was abnormal with evidence of some ischemia along with infarction pattern.  He states over the last few months he has noticed chest pain in the left chest which is new compared to his prior chest pain episodes when he per her prior to her stent.  Episode is moderate intensity with radiation across left chest.  He was scheduled for cath on Tuesday due to abnormal Past nuclear stress test however due to these symptoms presented earlier to the ER.  Troponins were mildly elevated on initial check.  No significant EKG changes of concern however may be in the circumflex distribution if there is significant lesion.  Has been on aspirin and Plavix and the rest of his medicines.  Did notice his heart rate has been faster than he normally has been in the 80s and 90s at in the 50s or 60s.  SPECT: 01/20  The left ventricular ejection fraction is moderately decreased (30-44%).  Nuclear stress EF: 43%.  Blood pressure demonstrated a hypertensive response to stress.  There was no ST segment deviation noted during stress.  Defect 1: There is a small defect of mild severity  present in the apical inferior and apex location.  Defect 2: There is a small defect of moderate severity present in the apical anterior and apex location.  Defect 3: There is a small defect of moderate severity present in the basal inferolateral location.  Findings consistent with ischemia and prior myocardial infarction with peri-infarct ischemia.  This is an intermediate risk study.   LHC: 8/18 Conclusions: 1. Mild to moderate, non-obstructive coronary artery disease similar to conclusion of prior catheterization on 11/27/16. 2. Widely patent LCx/OM stent. 3. Mildly elevated left ventricular filling pressure with LVEF 45%.   Past Medical History:  Diagnosis Date  . BPH (benign prostatic hyperplasia)   . CAD (coronary artery disease)    a. NSTEMI troponin >65 with cath 11/2016 S/p PTCA & DES to mid circumflex; irregularies in LAD & RCA, LVEDP 24, EF 50% and 45-50% by echo.  . Chronic chest pain   . Chronic combined systolic and diastolic CHF (congestive heart failure) ([place])    a. EF 45% in 2018. b. EF 55-60% by echo 01/2017. c. EF 48% by nuc 07/2017.  . CKD (chronic kidney disease), stage II   . Cluster headache    hx of  . Complication of anesthesia    difficult waking up"  . Diverticulosis   . Drug abuse ([place])    hx of  . GERD (gastroesophageal reflux disease)   . Hiatal hernia   . Hyperlipidemia   . Hypertension   . Hypothyroidism   . Nontoxic multinodular goiter   . NSTEMI (  non-ST elevated myocardial infarction) ([place])   . Perforated nasal septum   . Pulmonary nodule --- CT 10/19/2014: Nodule is stable, no further routine x-rays 01/06/2009  . TIA (transient ischemic attack)   . Trigeminal neuralgia   . Tubular adenoma of colon 12/2015  . Unspecified asthma(493.90)     Past Surgical History:  Procedure Laterality Date  . APPENDECTOMY    . CARDIAC CATHETERIZATION N/A 11/25/2014   Procedure: Left Heart Cath and Coronary Angiography;  Surgeon: Belva Crome, MD;   Location: [facility];  Service: Cardiovascular;  Laterality: N/A;  . CARDIAC CATHETERIZATION  12/08/2008   Archie Endo 08/24/2010  . COLECTOMY  ~ 1976   "I had a blockage"  . CORONARY STENT INTERVENTION N/A 11/27/2016   Procedure: CORONARY STENT INTERVENTION;  Surgeon: Belva Crome, MD;  Location: [facility];  Service: Cardiovascular;  Laterality: N/A;  . CORONARY STENT PLACEMENT  11/27/2016   STENT XIENCE ALPINE RX 3.0X15 drug eluting stent was successfully placed  . CYST EXCISION Left 10/31/2016   Jaw  . DESTRUCTION TRIGEMINAL NERVE VIA NEUROLYTIC AGENT  x 3 , R side last ~2010  . KNEE ARTHROSCOPY Right 06/26/2000   Arthroscopy followed by open lateral release/notes 09/06/2010  . LEFT HEART CATH AND CORONARY ANGIOGRAPHY N/A 11/27/2016   Procedure: LEFT HEART CATH AND CORONARY ANGIOGRAPHY;  Surgeon: Belva Crome, MD;  Location: [facility];  Service: Cardiovascular;  Laterality: N/A;  . LEFT HEART CATH AND CORONARY ANGIOGRAPHY N/A 12/07/2016   Procedure: LEFT HEART CATH AND CORONARY ANGIOGRAPHY;  Surgeon: Nelva Bush, MD;  Location: [facility];  Service: Cardiovascular;  Laterality: N/A;  . 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 Ardine Bjork 09/06/2010  . SHOULDER HEMI-ARTHROPLASTY Right 05/26/2010   revision/notes 05/27/2010     Medications Prior to Admission: Prior to Admission medications   Medication Sig Start Date End Date Taking? Authorizing Provider  amLODipine (NORVASC) 10 MG tablet Take 1 tablet (10 mg total) by mouth daily. 06/07/18  Yes [name], DO  aspirin EC 81 MG tablet Take 81 mg by mouth daily.   Yes [provider]  clopidogrel (PLAVIX) 75 MG tablet Take 75 mg by mouth daily.   Yes [provider]  ezetimibe (ZETIA) 10 MG tablet Take 1 tablet (10 mg total) by mouth daily. 05/27/18  Yes [name], DO  FLUoxetine (PROZAC) 20 MG tablet Take 1 tablet (20 mg total) by mouth daily. Take  1/2 tab daily for the first 2 weeks. Patient taking differently: Take 10-20 mg by mouth See admin instructions. Take 0.5 tablet (10 mg) by mouth for 14 days, then increase to 1 tablet (10 mg) by mouth daily 06/07/18  Yes Wendling, Crosby Oyster, DO  fluticasone [facility]) 50 MCG/ACT nasal spray Place 2 sprays into both nostrils daily. 05/27/18  Yes [name], DO  gabapentin (NEURONTIN) 300 MG capsule Take 1 capsule (300 mg total) by mouth 2 (two) times daily. 07/23/17  Yes Daune Perch, NP  ibuprofen (ADVIL,MOTRIN) 200 MG tablet Take 400 mg by mouth every 8 (eight) hours as needed (pain.).   Yes [provider]  lansoprazole (PREVACID) 30 MG capsule Take 1 capsule (30 mg total) by mouth 2 (two) times daily before a meal. 05/27/18  Yes Wendling, Crosby Oyster, DO  levocetirizine (XYZAL) 5 MG tablet Take 1 tablet (5 mg total) by mouth every evening. 05/27/18  Yes [name], DO  losartan (COZAAR) 100 MG  tablet TAKE 1 TABLET (100 MG)  BY MOUTH DAILY Patient taking differently: Take 100 mg by mouth daily.  05/27/18  Yes [name], DO  metoprolol tartrate (LOPRESSOR) 25 MG tablet Take 1 tablet (25 mg total) by mouth 2 (two) times daily. 05/28/18  Yes Imogene Burn, PA-C  nitroGLYCERIN (NITROSTAT) 0.4 MG SL tablet Place 1 tablet (0.4 mg total) under the tongue every 5 (five) minutes as needed for chest pain. 05/28/18  Yes Imogene Burn, PA-C  Polyethyl Glycol-Propyl Glycol (SYSTANE OP) Place 1 drop into both eyes every 3 (three) hours as needed (dry eyes).    Yes [provider]  promethazine (PHENERGAN) 12.5 MG tablet Take 12.5 mg by mouth every 6 (six) hours as needed for nausea or vomiting.   Yes [provider]  rosuvastatin (CRESTOR) 20 MG tablet Take 1 tablet (20 mg total) by mouth at bedtime. 05/27/18 05/22/19 Yes Wendling, Crosby Oyster, DO     Allergies:    Allergies  Allergen Reactions  . Tramadol Palpitations    Social History:     Social History   Socioeconomic History  . Marital status: Married    Spouse name: Not on file  . Number of children: 2  . Years of education: Not on file  . Highest education level: Not on file  Occupational History  . Occupation: Merchandiser, retail [employer]  . Financial resource strain: Not on file  . Food insecurity:    Worry: Not on file    Inability: Not on file  . Transportation needs:    Medical: Not on file    Non-medical: Not on file  Tobacco Use  . Smoking status: Former Smoker    Packs/day: 0.25    Years: 12.00    Pack years: 3.00    Types: Cigarettes    Last attempt to quit: 11/15/2016    Years since quitting: 1.5  . Smokeless tobacco: Never Used  Substance and Sexual Activity  . Alcohol use: Yes    Comment: occ  . Drug use: No  . Sexual activity: Not on file  Lifestyle  . Physical activity:    Days per week: Not on file    Minutes per session: Not on file  . Stress: Not on file  Relationships  . Social connections:    Talks on phone: Not on file    Gets together: Not on file    Attends religious service: Not on file    Active member of club or organization: Not on file    Attends meetings of clubs or organizations: Not on file    Relationship status: Not on file  . Intimate partner violence:    Fear of current or ex partner: Not on file    Emotionally abused: Not on file    Physically abused: Not on file    Forced sexual activity: Not on file  Other Topics Concern  . Not on file  Social History Narrative   [employer]   Culinary school in [place]   Married - '84 - 5 years divorced; remarried '96   1 son - '85              Family History:   The patient's family history includes CAD in his father; Cancer in his father; Diabetes in his mother; Heart disease in his father and maternal aunt; Heart failure in his father; Hyperlipidemia in his mother; Hypertension in his father and mother; Mesothelioma in his father; Sudden  death in his  father. There is no history of Prostate cancer or Colon cancer.     Review of Systems: [y] = yes, [ ]  = no   . General: Weight gain [ ] ; Weight loss [ ] ; Anorexia [ ] ; Fatigue [ ] ; Fever [ ] ; Chills [ ] ; Weakness [ ]   . Cardiac: Chest pain/pressure Blue.Reese ]; Resting SOB [ ] ; Exertional SOB [ ] ; Orthopnea [ ] ; Pedal Edema [ ] ; Palpitations [ ] ; Syncope [ ] ; Presyncope [ ] ; Paroxysmal nocturnal dyspnea[ ]   . Pulmonary: Cough [ ] ; Wheezing[ ] ; Hemoptysis[ ] ; Sputum [ ] ; Snoring [ ]   . GI: Vomiting[ ] ; Dysphagia[ ] ; Melena[ ] ; Hematochezia [ ] ; Heartburn[ ] ; Abdominal pain [ ] ; Constipation [ ] ; Diarrhea [ ] ; BRBPR [ ]   . GU: Hematuria[ ] ; Dysuria [ ] ; Nocturia[ ]   . Vascular: Pain in legs with walking [ ] ; Pain in feet with lying flat [ ] ; Non-healing sores [ ] ; Stroke [ ] ; TIA [ ] ; Slurred speech [ ] ;  . Neuro: Headaches[ ] ; Vertigo[ ] ; Seizures[ ] ; Paresthesias[ ] ;Blurred vision [ ] ; Diplopia [ ] ; Vision changes [ ]   . Ortho/Skin: Arthritis [ ] ; Joint pain [ ] ; Muscle pain [ ] ; Joint swelling [ ] ; Back Pain [ ] ; Rash [ ]   . Psych: Depression[ ] ; Anxiety[ ]   . Heme: Bleeding problems [ ] ; Clotting disorders [ ] ; Anemia [ ]   . Endocrine: Diabetes [ ] ; Thyroid dysfunction[ ]   Physical Exam/Data:   Vitals:   06/08/18 0000 06/08/18 0017 06/08/18 0100 06/08/18 0235  BP: (!) 168/112 (!) 144/105 136/89 (!) 158/103  Pulse: 75 88 74 75  Resp: 12 19 15 20   Temp:    (!) 97.5 F (36.4 C)  TempSrc:    Oral  SpO2: 98% 96% 97% 98%  Weight:    113 kg  Height:    5\' 10"  (1.778 m)   No intake or output data in the 24 hours ending 06/08/18 0321 Filed Weights   06/07/18 2247 06/08/18 0235  Weight: 114.9 kg 113 kg   Body mass index is 35.76 kg/m.  General:  Well nourished, well developed, in no acute distress HEENT: normal Lymph: no adenopathy Neck: no JVD Endocrine:  No thryomegaly Vascular: No carotid bruits; FA pulses 2+ bilaterally without bruits  Cardiac:  normal S1, S2; RRR; no murmur  Lungs:   clear to auscultation bilaterally, no wheezing, rhonchi or rales  Abd: soft, nontender, no hepatomegaly  Ext: no edema Musculoskeletal:  No deformities, BUE and BLE strength normal and equal Skin: warm and dry  Neuro:  CNs 2-12 intact, no focal abnormalities noted Psych:  Normal affect    EKG:  The ECG that was done today was personally reviewed and demonstrates normal sinus rhythm with T wave flattening in the lateral leads.  Relevant CV Studies: Troponin mildly elevated 0.03.  Laboratory Data:  Chemistry Recent Labs  Lab 06/07/18 1443 06/07/18 2256  NA 141 136  K 4.1 4.0  CL 98 100  CO2 24 19*  GLUCOSE 108* 103*  BUN 23 23*  CREATININE 1.76* 1.58*  CALCIUM 9.5 9.4  GFRNONAA 43* 49*  GFRAA 49* 56*  ANIONGAP  --  17*    No results for input(s): PROT, ALBUMIN, AST, ALT, ALKPHOS, BILITOT in the last 168 hours. Hematology Recent Labs  Lab 06/07/18 1443 06/07/18 2256  WBC 6.6 8.6  RBC 4.73 4.83  HGB 15.7 15.6  HCT 43.7 46.5  MCV 92 96.3  MCH  33.2* 32.3  MCHC 35.9* 33.5  RDW 15.1 14.0  PLT 136* 141*   Cardiac Enzymes Recent Labs  Lab 06/07/18 2256  TROPONINI 0.03*   No results for input(s): TROPIPOC in the last 168 hours.  BNPNo results for input(s): BNP, PROBNP in the last 168 hours.  DDimer No results for input(s): DDIMER in the last 168 hours.  Radiology/Studies:  Dg Chest 2 View  Result Date: 06/07/2018 CLINICAL DATA:  Heart palpitations EXAM: CHEST - 2 VIEW COMPARISON:  Chest radiograph 07/22/2017 FINDINGS: Mild cardiomegaly. No focal airspace consolidation or pulmonary edema. No pleural effusion or pneumothorax. IMPRESSION: No active cardiopulmonary disease. Electronically Signed   By: Ulyses Jarred M.D.   On: 06/07/2018 23:05    Assessment and Plan:   1. Unstable angina 2. Abnormal nuclear stress test 3. Coronary atherosclerotic heart disease status post PCI drug-eluting stent 11/2016 to circumflex 4. Dyslipidemia 5. Chronic heart failure with  preserved ejection fraction, Euvolemic     -We will trend troponins with a stat troponin now. -We will start heparin drip and nitroglycerin drip and closely monitor blood pressure. -Change metoprolol to carvedilol. -Continue aspirin and Plavix.  May consider escalating to Brilinta. -We will escalate Crestor to 40 mg daily along with Zetia 10 mg.  We will check lipids. -Left heart catheterization likely Monday.   Severity of Illness: The appropriate patient status for this patient is INPATIENT. Inpatient status is judged to be reasonable and necessary in order to provide the required intensity of service to ensure the patient's safety. The patient's presenting symptoms, physical exam findings, and initial radiographic and laboratory data in the context of their chronic comorbidities is felt to place them at high risk for further clinical deterioration. Furthermore, it is not anticipated that the patient will be medically stable for discharge from the hospital within 2 midnights of admission. The following factors support the patient status of inpatient.   " The patient's presenting symptoms include chest discomfort on and off for the last 2 months. " The worrisome physical exam findings include mild distress. " The initial radiographic and laboratory data are worrisome because of mild troponin elevation. " The chronic co-morbidities include obesity, known coronary disease.   * I certify that at the point of admission it is my clinical judgment that the patient will require inpatient hospital care spanning beyond 2 midnights from the point of admission due to high intensity of service, high risk for further deterioration and high frequency of surveillance required.*    For questions or updates, please contact [place] Please consult [email or web address] for contact info under        Signed, Chancy Milroy, MD  06/08/2018 3:21 AM

## 2018-06-08 NOTE — ED Provider Notes (Signed)
[facility] EMERGENCY DEPARTMENT Provider Note   CSN: [id number] Arrival date & time: 06/07/18  2240     History   Chief Complaint Chief Complaint  Patient presents with  . Palpitations    HPI [name] is a 58 y.o. male.  Patient is a 58 year old male with past medical history of coronary artery disease with stent in 2018.  He has recently been experiencing chest discomfort and had a stress test performed 2 days ago.  He was called by the cardiology office and was informed today that it was abnormal.  He began experiencing worsening discomfort this evening and presents for evaluation of this.  He describes a tightness in the center of his chest with associated nausea and shortness of breath.  This feels similar to symptoms he experienced in 2018 with his prior heart attack.  The history is provided by the patient.  Chest Pain  Pain location:  Substernal area Pain quality: pressure   Pain radiates to:  L shoulder Pain severity:  Moderate Onset quality:  Gradual Duration:  1 week Timing:  Intermittent Progression:  Worsening Chronicity:  New Relieved by:  Nothing Worsened by:  Nothing   Past Medical History:  Diagnosis Date  . BPH (benign prostatic hyperplasia)   . CAD (coronary artery disease)    a. NSTEMI troponin >65 with cath 11/2016 S/p PTCA & DES to mid circumflex; irregularies in LAD & RCA, LVEDP 24, EF 50% and 45-50% by echo.  . Chronic chest pain   . Chronic combined systolic and diastolic CHF (congestive heart failure) ([place])    a. EF 45% in 2018. b. EF 55-60% by echo 01/2017. c. EF 48% by nuc 07/2017.  . CKD (chronic kidney disease), stage II   . Cluster headache    hx of  . Complication of anesthesia    difficult waking up"  . Diverticulosis   . Drug abuse ([place])    hx of  . GERD (gastroesophageal reflux disease)   . Hiatal hernia   . Hyperlipidemia   . Hypertension   . Hypothyroidism   . Nontoxic multinodular goiter   . NSTEMI (non-ST  elevated myocardial infarction) ([place])   . Perforated nasal septum   . Pulmonary nodule --- CT 10/19/2014: Nodule is stable, no further routine x-rays 01/06/2009  . TIA (transient ischemic attack)   . Trigeminal neuralgia   . Tubular adenoma of colon 12/2015  . Unspecified asthma(493.90)     Patient Active Problem List   Diagnosis Date Noted  . Situational depression 06/07/2018  . Palpitations 05/28/2018  . Chronic cough 05/27/2018  . Gross hematuria 05/27/2018  . Chest pain 07/22/2017  . Ischemic cardiomyopathy 02/05/2017  . Obesity 12/08/2016  . Chronic diastolic CHF (congestive heart failure) ([place]) 12/08/2016  . Atypical chest pain 12/07/2016  . NSTEMI (non-ST elevated myocardial infarction) ([place]) 11/26/2016  . Intractable nausea and vomiting 10/09/2016  . Nausea vomiting and diarrhea 10/09/2016  . Enteritis due to Clostridium difficile 10/30/2015  . Malnutrition of moderate degree 10/21/2015  . Acute kidney injury ([place]) 10/20/2015  . PCP NOTES >>>>>>>>>>>>>>>>> 09/30/2015  . Hypercholesteremia 09/22/2015  . Renal mass, left 11/02/2014  . CAD (coronary artery disease)   . Drug abuse and dependence ([place]) 09/18/2014  . Visit for preventive health examination 02/04/2014  . Screening for ischemic heart disease 02/04/2014  . BPH associated with nocturia 12/24/2013  . Hypothyroidism 12/24/2013  . Chronic ethmoidal sinusitis 12/24/2013  . Low back pain radiating to right leg 05/10/2012  .  Thrombocytopenia ([place]) 12/05/2011  . Tobacco use 11/30/2011  . Anxiety 12/26/2010  . Transient cerebral ischemia 01/07/2010  . Asthma 01/19/2009  . Pulmonary nodule --- CT 10/19/2014: Nodule is stable, no further routine x-rays 01/06/2009  . Nontoxic multinodular goiter 12/31/2008  . DYSPNEA ON EXERTION 12/15/2008  . Trigeminal neuralgia 07/24/2007  . Dyslipidemia 12/27/2006  . Cluster headache 12/27/2006  . Essential hypertension 12/27/2006  . GERD 12/27/2006    Past Surgical History:    Procedure Laterality Date  . APPENDECTOMY    . CARDIAC CATHETERIZATION N/A 11/25/2014   Procedure: Left Heart Cath and Coronary Angiography;  Surgeon: Belva Crome, MD;  Location: [facility];  Service: Cardiovascular;  Laterality: N/A;  . CARDIAC CATHETERIZATION  12/08/2008   Archie Endo 08/24/2010  . COLECTOMY  ~ 1976   "I had a blockage"  . CORONARY STENT INTERVENTION N/A 11/27/2016   Procedure: CORONARY STENT INTERVENTION;  Surgeon: Belva Crome, MD;  Location: [facility];  Service: Cardiovascular;  Laterality: N/A;  . CORONARY STENT PLACEMENT  11/27/2016   STENT XIENCE ALPINE RX 3.0X15 drug eluting stent was successfully placed  . CYST EXCISION Left 10/31/2016   Jaw  . DESTRUCTION TRIGEMINAL NERVE VIA NEUROLYTIC AGENT  x 3 , R side last ~2010  . KNEE ARTHROSCOPY Right 06/26/2000   Arthroscopy followed by open lateral release/notes 09/06/2010  . LEFT HEART CATH AND CORONARY ANGIOGRAPHY N/A 11/27/2016   Procedure: LEFT HEART CATH AND CORONARY ANGIOGRAPHY;  Surgeon: Belva Crome, MD;  Location: [facility];  Service: Cardiovascular;  Laterality: N/A;  . LEFT HEART CATH AND CORONARY ANGIOGRAPHY N/A 12/07/2016   Procedure: LEFT HEART CATH AND CORONARY ANGIOGRAPHY;  Surgeon: Nelva Bush, MD;  Location: [facility];  Service: Cardiovascular;  Laterality: N/A;  . 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 Ardine Bjork 09/06/2010  . SHOULDER HEMI-ARTHROPLASTY Right 05/26/2010   revision/notes 05/27/2010        Home Medications    Prior to Admission medications   Medication Sig Start Date End Date Taking? Authorizing Provider  amLODipine (NORVASC) 10 MG tablet Take 1 tablet (10 mg total) by mouth daily. 06/07/18   [name], DO  aspirin EC 81 MG tablet Take 81 mg by mouth daily.    [provider]  clopidogrel (PLAVIX) 75 MG tablet Take 75 mg by mouth daily.    [provider]  ezetimibe (ZETIA) 10 MG tablet Take 1  tablet (10 mg total) by mouth daily. 05/27/18   [name], DO  FLUoxetine (PROZAC) 20 MG tablet Take 1 tablet (20 mg total) by mouth daily. Take 1/2 tab daily for the first 2 weeks. Patient taking differently: Take 10-20 mg by mouth See admin instructions. Take 0.5 tablet (10 mg) by mouth for 14 days, then increase to 1 tablet (10 mg) by mouth daily 06/07/18   Nani Ravens, Crosby Oyster, DO  fluticasone [facility]) 50 MCG/ACT nasal spray Place 2 sprays into both nostrils daily. 05/27/18   [name], DO  gabapentin (NEURONTIN) 300 MG capsule Take 1 capsule (300 mg total) by mouth 2 (two) times daily. 07/23/17   Daune Perch, NP  ibuprofen (ADVIL,MOTRIN) 200 MG tablet Take 400 mg by mouth every 8 (eight) hours as needed (pain.).    [provider]  lansoprazole (PREVACID) 30 MG capsule Take 1 capsule (30 mg total) by mouth 2 (two) times daily before a meal. 05/27/18   Wendling, Crosby Oyster, DO  levocetirizine (  XYZAL) 5 MG tablet Take 1 tablet (5 mg total) by mouth every evening. 05/27/18   [name], DO  losartan (COZAAR) 100 MG tablet TAKE 1 TABLET (100 MG)  BY MOUTH DAILY Patient taking differently: Take 100 mg by mouth daily.  05/27/18   [name], DO  metoprolol tartrate (LOPRESSOR) 25 MG tablet Take 1 tablet (25 mg total) by mouth 2 (two) times daily. 05/28/18   Imogene Burn, PA-C  nitroGLYCERIN (NITROSTAT) 0.4 MG SL tablet Place 1 tablet (0.4 mg total) under the tongue every 5 (five) minutes as needed for chest pain. 05/28/18   Imogene Burn, PA-C  Polyethyl Glycol-Propyl Glycol (SYSTANE OP) Place 1 drop into both eyes every 3 (three) hours as needed (dry eyes).     [provider]  promethazine (PHENERGAN) 12.5 MG tablet Take 12.5 mg by mouth every 6 (six) hours as needed for nausea or vomiting.    [provider]  rosuvastatin (CRESTOR) 20 MG tablet Take 1 tablet (20 mg total) by mouth at bedtime. 05/27/18 05/22/19  [name], DO    Family History Family History  Problem Relation Age of Onset  . Diabetes Mother   . Hyperlipidemia Mother   . Hypertension Mother   . Hypertension Father   . Heart disease Father   . Cancer Father   . Sudden death Father        OCT 08-08-09  . CAD Father   . Heart failure Father   . Mesothelioma Father   . Heart disease Maternal Aunt   . Prostate cancer Neg Hx   . Colon cancer Neg Hx     Social History Social History   Tobacco Use  . Smoking status: Former Smoker    Packs/day: 0.25    Years: 12.00    Pack years: 3.00    Types: Cigarettes    Last attempt to quit: 11/15/2016    Years since quitting: 1.5  . Smokeless tobacco: Never Used  Substance Use Topics  . Alcohol use: Yes    Comment: occ  . Drug use: No     Allergies   Tramadol   Review of Systems Review of Systems  Cardiovascular: Positive for chest pain.  All other systems reviewed and are negative.    Physical Exam Updated Vital Signs BP (!) 144/105   Pulse 88   Temp 97.9 F (36.6 C) (Oral)   Resp 19   Ht 5\' 10"  (1.778 m)   Wt 114.9 kg   SpO2 96%   BMI 36.36 kg/m   Physical Exam Vitals signs and nursing note reviewed.  Constitutional:      General: He is not in acute distress.    Appearance: He is well-developed. He is not diaphoretic.  HENT:     Head: Normocephalic and atraumatic.  Neck:     Musculoskeletal: Normal range of motion and neck supple.  Cardiovascular:     Rate and Rhythm: Normal rate and regular rhythm.     Heart sounds: No murmur. No friction rub.  Pulmonary:     Effort: Pulmonary effort is normal. No respiratory distress.     Breath sounds: Normal breath sounds. No wheezing or rales.  Abdominal:     General: Bowel sounds are normal. There is no distension.     Palpations: Abdomen is soft.     Tenderness: There is no abdominal tenderness.  Musculoskeletal: Normal range of motion.        General: No swelling  or tenderness.     Right lower leg: No  edema.     Left lower leg: No edema.  Skin:    General: Skin is warm and dry.  Neurological:     Mental Status: He is alert and oriented to person, place, and time.     Coordination: Coordination normal.      ED Treatments / Results  Labs (all labs ordered are listed, but only abnormal results are displayed) Labs Reviewed  BASIC METABOLIC PANEL - Abnormal; Notable for the following components:      Result Value   CO2 19 (*)    Glucose, Bld 103 (*)    BUN 23 (*)    Creatinine, Ser 1.58 (*)    GFR calc non Af Amer 49 (*)    GFR calc Af Amer 56 (*)    Anion gap 17 (*)    All other components within normal limits  CBC - Abnormal; Notable for the following components:   Platelets 141 (*)    All other components within normal limits  TROPONIN I - Abnormal; Notable for the following components:   Troponin I 0.03 (*)    All other components within normal limits    EKG EKG Interpretation  Date/Time:  Friday June 07 2018 22:46:07 EST Ventricular Rate:  86 PR Interval:    QRS Duration: 96 QT Interval:  354 QTC Calculation: 424 R Axis:   71 Text Interpretation:  Sinus rhythm Borderline T abnormalities, lateral leads Baseline wander in lead(s) V5 V6 change of slope to st segment of v2 Otherwise no significant change Confirmed by Deno Etienne [phone]) on 06/07/2018 11:58:57 PM   Radiology Dg Chest 2 View  Result Date: 06/07/2018 CLINICAL DATA:  Heart palpitations EXAM: CHEST - 2 VIEW COMPARISON:  Chest radiograph 07/22/2017 FINDINGS: Mild cardiomegaly. No focal airspace consolidation or pulmonary edema. No pleural effusion or pneumothorax. IMPRESSION: No active cardiopulmonary disease. Electronically Signed   By: Ulyses Jarred M.D.   On: 06/07/2018 23:05    Procedures Procedures (including critical care time)  Medications Ordered in ED Medications  sodium chloride flush (NS) 0.9 % injection 3 mL (3 mLs Intravenous Not Given 06/07/18 2259)  nitroGLYCERIN (NITROSTAT) SL  tablet 0.4 mg (0.4 mg Sublingual Given 06/08/18 0018)  morphine 4 MG/ML injection 4 mg (4 mg Intravenous Given 06/08/18 0000)  aspirin chewable tablet 324 mg (324 mg Oral Given 06/08/18 0004)     Initial Impression / Assessment and Plan / ED Course  I have reviewed the triage vital signs and the nursing notes.  Pertinent labs & imaging results that were available during my care of the patient were reviewed by me and considered in my medical decision making (see chart for details).  Patient with history of coronary artery disease with stent in 2018 and recent abnormal stress test awaiting heart cath.  He presents today with complaints of chest discomfort.  His EKG is unchanged and troponin is negative.  Patient is experiencing ongoing discomfort.  Care was discussed with Dr. Shirlee Latch from cardiology who agrees to accept the patient in transfer.  Final Clinical Impressions(s) / ED Diagnoses   Final diagnoses:  None    ED Discharge Orders    None       Veryl Speak, MD 06/08/18 [phone]

## 2018-06-08 NOTE — Progress Notes (Signed)
[place] for heparin Indication: chest pain/ACS  Allergies  Allergen Reactions  . Tramadol Palpitations    Patient Measurements: Height: 5\' 10"  (177.8 cm) Weight: 249 lb 3.2 oz (113 kg) IBW/kg (Calculated) : 73 Heparin Dosing Weight: 97kg  Vital Signs: Temp: 97.5 F (36.4 C) (02/15 0838) Temp Source: Oral (02/15 0838) BP: 111/90 (02/15 1013) Pulse Rate: 87 (02/15 1012)  Labs: Recent Labs    06/07/18 1443 06/07/18 2256 06/08/18 0315 06/08/18 1032  HGB 15.7 15.6  --   --   HCT 43.7 46.5  --   --   PLT 136* 141*  --   --   HEPARINUNFRC  --   --   --  0.44  CREATININE 1.76* 1.58*  --   --   TROPONINI  --  0.03* <0.03 <0.03    Estimated Creatinine Clearance: 66.5 mL/min (A) (by C-G formula based on SCr of 1.58 mg/dL (H)).   Medical History: Past Medical History:  Diagnosis Date  . BPH (benign prostatic hyperplasia)   . CAD (coronary artery disease)    a. NSTEMI troponin >65 with cath 11/2016 S/p PTCA & DES to mid circumflex; irregularies in LAD & RCA, LVEDP 24, EF 50% and 45-50% by echo.  . Chronic chest pain   . Chronic combined systolic and diastolic CHF (congestive heart failure) ([place])    a. EF 45% in 2018. b. EF 55-60% by echo 01/2017. c. EF 48% by nuc 07/2017.  . CKD (chronic kidney disease), stage II   . Cluster headache    hx of  . Complication of anesthesia    difficult waking up"  . Diverticulosis   . Drug abuse ([place])    hx of  . GERD (gastroesophageal reflux disease)   . Hiatal hernia   . Hyperlipidemia   . Hypertension   . Hypothyroidism   . Nontoxic multinodular goiter   . NSTEMI (non-ST elevated myocardial infarction) ([place])   . Perforated nasal septum   . Pulmonary nodule --- CT 10/19/2014: Nodule is stable, no further routine x-rays 01/06/2009  . TIA (transient ischemic attack)   . Trigeminal neuralgia   . Tubular adenoma of colon 12/2015  . Unspecified asthma(493.90)     Medications:  Infusions:  .  heparin Stopped (06/08/18 0416)  . nitroGLYCERIN 60 mcg/min (06/08/18 1000)    Assessment: 35 yom presented to the ED with heart palpitations. To start IV heparin. Baseline Hgb is WNL and platelets are slightly low. He is not on anticoagulation PTA. Troponin is mildly elevated.   Heparin level therapeutic. No bleed issues documented.  Goal of Therapy:  Heparin level 0.3-0.7 units/ml Monitor platelets by anticoagulation protocol: Yes   Plan:  Continue heparin gtt at 1300 units/hr Monitor daily heparin level and CBC, s/sx bleeding Cath planned on Monday  Elicia Lamp, PharmD, BCPS Please check AMION for all [facility] contact numbers Clinical Pharmacist 06/08/2018 12:07 PM

## 2018-06-08 NOTE — Progress Notes (Signed)
   Called by RN Patient still complaining of chest pain.   Unchanged since admit.  Troponins have thus far been 0.03 - 0.03.  ECG without acute changes.  He is on 60 mcg NTG gtt.  BP remains elevated and HR in the 90s.  Caution was used yesterday given Morphine due to concerns of possible drug seeking behavior. PLAN:  1. Increase Coreg to 9.375 mg bid 2. Hold off on Morphine for now. 3. Continue to cycle enzymes as planned. Richardson Dopp, PA-C    06/08/2018 9:14 AM

## 2018-06-08 NOTE — Progress Notes (Signed)
ANTICOAGULATION CONSULT NOTE - Initial Consult  Pharmacy Consult for heparin Indication: chest pain/ACS  Allergies  Allergen Reactions  . Tramadol Palpitations    Patient Measurements: Height: 5\' 10"  (177.8 cm) Weight: 249 lb 3.2 oz (113 kg) IBW/kg (Calculated) : 73 Heparin Dosing Weight: 97kg  Vital Signs: Temp: 97.5 F (36.4 C) (02/15 0235) Temp Source: Oral (02/15 0235) BP: 158/103 (02/15 0235) Pulse Rate: 75 (02/15 0235)  Labs: Recent Labs    06/07/18 1443 06/07/18 2256  HGB 15.7 15.6  HCT 43.7 46.5  PLT 136* 141*  CREATININE 1.76* 1.58*  TROPONINI  --  0.03*    Estimated Creatinine Clearance: 66.5 mL/min (A) (by C-G formula based on SCr of 1.58 mg/dL (H)).   Medical History: Past Medical History:  Diagnosis Date  . BPH (benign prostatic hyperplasia)   . CAD (coronary artery disease)    a. NSTEMI troponin >65 with cath 11/2016 S/p PTCA & DES to mid circumflex; irregularies in LAD & RCA, LVEDP 24, EF 50% and 45-50% by echo.  . Chronic chest pain   . Chronic combined systolic and diastolic CHF (congestive heart failure) ([place])    a. EF 45% in 2018. b. EF 55-60% by echo 01/2017. c. EF 48% by nuc 07/2017.  . CKD (chronic kidney disease), stage II   . Cluster headache    hx of  . Complication of anesthesia    difficult waking up"  . Diverticulosis   . Drug abuse ([place])    hx of  . GERD (gastroesophageal reflux disease)   . Hiatal hernia   . Hyperlipidemia   . Hypertension   . Hypothyroidism   . Nontoxic multinodular goiter   . NSTEMI (non-ST elevated myocardial infarction) ([place])   . Perforated nasal septum   . Pulmonary nodule --- CT 10/19/2014: Nodule is stable, no further routine x-rays 01/06/2009  . TIA (transient ischemic attack)   . Trigeminal neuralgia   . Tubular adenoma of colon 12/2015  . Unspecified asthma(493.90)     Medications:  Infusions:  . heparin    . nitroGLYCERIN      Assessment: 78 yom presented to the ED with heart  palpitations. To start IV heparin. Baseline Hgb is WNL and platelets are slightly low. He is not on anticoagulation PTA. Troponin is mildly elevated.   Goal of Therapy:  Heparin level 0.3-0.7 units/ml Monitor platelets by anticoagulation protocol: Yes   Plan:  Heparin bolus 4000 units IV x 1 Heparin gtt 1300 units/hr Check a 6 hr heparin level Daily heparin level and CBC  Lan Entsminger, Rande Lawman 06/08/2018,3:31 AM

## 2018-06-08 NOTE — ED Notes (Signed)
ED Provider at bedside. 

## 2018-06-08 NOTE — ED Notes (Signed)
[facility] notified (tammy) - patient ready for transport

## 2018-06-09 ENCOUNTER — Other Ambulatory Visit (HOSPITAL_COMMUNITY): Payer: Self-pay

## 2018-06-09 ENCOUNTER — Other Ambulatory Visit: Payer: Self-pay

## 2018-06-09 LAB — CBC
HCT: 39.6 % (ref 39.0–52.0)
Hemoglobin: 13.8 g/dL (ref 13.0–17.0)
MCH: 33.6 pg (ref 26.0–34.0)
MCHC: 34.8 g/dL (ref 30.0–36.0)
MCV: 96.4 fL (ref 80.0–100.0)
Platelets: 115 10*3/uL — ABNORMAL LOW (ref 150–400)
RBC: 4.11 MIL/uL — ABNORMAL LOW (ref 4.22–5.81)
RDW: 14.1 % (ref 11.5–15.5)
WBC: 5.6 10*3/uL (ref 4.0–10.5)
nRBC: 0 % (ref 0.0–0.2)

## 2018-06-09 LAB — HIV ANTIBODY (ROUTINE TESTING W REFLEX): HIV Screen 4th Generation wRfx: NONREACTIVE

## 2018-06-09 LAB — HEPARIN LEVEL (UNFRACTIONATED): Heparin Unfractionated: 0.32 IU/mL (ref 0.30–0.70)

## 2018-06-09 NOTE — Progress Notes (Signed)
Progress Note  Patient Name: [name] Date of Encounter: 06/09/2018   Primary Cardiologist: Ena Dawley, MD    Subjective   No pain concerned about palpitations prior to admission but no arrhythmia on monitor to date  Inpatient Medications    Scheduled Meds: . amLODipine  10 mg Oral Daily  . aspirin EC  81 mg Oral Daily  . carvedilol  9.375 mg Oral BID WC  . clopidogrel  75 mg Oral Daily  . ezetimibe  10 mg Oral Daily  . FLUoxetine  10 mg Oral Daily  . fluticasone  2 spray Each Nare Daily  . gabapentin  300 mg Oral BID  . loratadine  10 mg Oral QPM  . losartan  100 mg Oral Daily  . pantoprazole  40 mg Oral Daily  . ranolazine  500 mg Oral BID  . rosuvastatin  40 mg Oral QHS  . sodium chloride flush  3 mL Intravenous Once   Continuous Infusions: . heparin 1,350 Units/hr (06/09/18 1047)  . nitroGLYCERIN 65 mcg/min (06/09/18 0851)   PRN Meds: acetaminophen, alum & mag hydroxide-simeth, ketorolac, nitroGLYCERIN, ondansetron (ZOFRAN) IV, promethazine   Vital Signs    Vitals:   06/09/18 0548 06/09/18 0848 06/09/18 1156 06/09/18 1235  BP:  119/72 108/72 111/84  Pulse:  71  75  Resp:      Temp:  97.7 F (36.5 C)    TempSrc:  Oral    SpO2:  96%  95%  Weight: 133.5 kg     Height:        Intake/Output Summary (Last 24 hours) at 06/09/2018 1243 Last data filed at 06/09/2018 0700 Gross per 24 hour  Intake 1163.67 ml  Output -  Net 1163.67 ml   Last 3 Weights 06/09/2018 06/08/2018 06/07/2018  Weight (lbs) 294 lb 6.4 oz 249 lb 3.2 oz 253 lb 6 oz  Weight (kg) 133.539 kg 113.036 kg 114.93 kg      Telemetry    NSR  - Personally Reviewed  ECG    NSR no acute ST changes  - Personally Reviewed  Physical Exam  Overweight black male  GEN: No acute distress.   Neck: No JVD Cardiac: RRR, no murmurs, rubs, or gallops.  Respiratory: Clear to auscultation bilaterally. GI: Soft, nontender, non-distended  MS: No edema; No deformity. Neuro:  Nonfocal  Psych:  Normal affect   Labs    Chemistry Recent Labs  Lab 06/07/18 1443 06/07/18 2256  NA 141 136  K 4.1 4.0  CL 98 100  CO2 24 19*  GLUCOSE 108* 103*  BUN 23 23*  CREATININE 1.76* 1.58*  CALCIUM 9.5 9.4  GFRNONAA 43* 49*  GFRAA 49* 56*  ANIONGAP  --  17*     Hematology Recent Labs  Lab 06/07/18 1443 06/07/18 2256 06/09/18 0448  WBC 6.6 8.6 5.6  RBC 4.73 4.83 4.11*  HGB 15.7 15.6 13.8  HCT 43.7 46.5 39.6  MCV 92 96.3 96.4  MCH 33.2* 32.3 33.6  MCHC 35.9* 33.5 34.8  RDW 15.1 14.0 14.1  PLT 136* 141* 115*    Cardiac Enzymes Recent Labs  Lab 06/07/18 2256 06/08/18 0315 06/08/18 1032 06/08/18 1623  TROPONINI 0.03* <0.03 <0.03 <0.03   No results for input(s): TROPIPOC in the last 168 hours.   BNPNo results for input(s): BNP, PROBNP in the last 168 hours.   DDimer No results for input(s): DDIMER in the last 168 hours.   Radiology    Dg Chest 2 View  Result  Date: 06/07/2018 CLINICAL DATA:  Heart palpitations EXAM: CHEST - 2 VIEW COMPARISON:  Chest radiograph 07/22/2017 FINDINGS: Mild cardiomegaly. No focal airspace consolidation or pulmonary edema. No pleural effusion or pneumothorax. IMPRESSION: No active cardiopulmonary disease. Electronically Signed   By: Ulyses Jarred M.D.   On: 06/07/2018 23:05    Cardiac Studies   06/05/18 Myovue    The left ventricular ejection fraction is moderately decreased (30-44%).  Nuclear stress EF: 43%.  Blood pressure demonstrated a hypertensive response to stress.  There was no ST segment deviation noted during stress.  Defect 1: There is a small defect of mild severity present in the apical inferior and apex location.  Defect 2: There is a small defect of moderate severity present in the apical anterior and apex location.  Defect 3: There is a small defect of moderate severity present in the basal inferolateral location.  Findings consistent with ischemia and prior myocardial infarction with peri-infarct  ischemia.  This is an intermediate risk study.   Ejection fraction is reduced with global hypokinesis.  There is a small moderate partially reversible defect in the anteroapex that goes to mild and small moderate partially reversible defect in the basal inferolateral wall that goes to mild.  The suggest possible peri-infarct ischemia.  There is a reversible mild defect in the inferoapex that returns to normal.  This suggests ischemia.  Prior exam noted in medical record documentation is not available for review.  Patient Profile     [name] is a 58 y.o. male with history of HTN, Chronic HFpef, DLD, CAD status post and STEMI treated with DES to circumflex 11/2016 with irregularityin the LAD and RCA but no obstructive lesions, repeat cath 1 week later showed patent stent, follow-up echo 01/2017 LVEF improved to 55 to 60%, chest pain 07/2017 Lexiscan Myoview no ischemia LVEF 48% with fixed perfusion defect of the inferior wall stable  Assessment & Plan    Chest Pain:  Not clear that he is having acute coronary syndrome Prolonged pain but negative troponin x 3 and no acute ECG changes.  Had DES to circumflex August 2018 without significant disease in RCA and LAD. Myovue 07/23/17 considered low risk but did have peri infarct ischemia see above. Continue heparin and iv nitro Avoid MSO4 ? Drug seeking behavior can use Toradol for pain Cath scheduled for tomorrow with [facility] at noon.   Palpitations: no arrhythmia on monitor continue beta blocker        For questions or updates, please contact [place] Please consult [email or web address] for contact info under        Signed, Jenkins Rouge, MD  06/09/2018, 12:43 PM

## 2018-06-09 NOTE — Progress Notes (Signed)
[place] for heparin Indication: chest pain/ACS  Allergies  Allergen Reactions  . Tramadol Palpitations    Patient Measurements: Height: 5\' 10"  (177.8 cm) Weight: 294 lb 6.4 oz (133.5 kg) IBW/kg (Calculated) : 73 Heparin Dosing Weight: 97kg  Vital Signs: Temp: 97.7 F (36.5 C) (02/16 0848) Temp Source: Oral (02/16 0848) BP: 119/72 (02/16 0848) Pulse Rate: 71 (02/16 0848)  Labs: Recent Labs    06/07/18 1443  06/07/18 2256 06/08/18 0315 06/08/18 1032 06/08/18 1623 06/09/18 0448  HGB 15.7  --  15.6  --   --   --  13.8  HCT 43.7  --  46.5  --   --   --  39.6  PLT 136*  --  141*  --   --   --  115*  HEPARINUNFRC  --   --   --   --  0.44  --  0.32  CREATININE 1.76*  --  1.58*  --   --   --   --   TROPONINI  --    < > 0.03* <0.03 <0.03 <0.03  --    < > = values in this interval not displayed.    Estimated Creatinine Clearance: 72.6 mL/min (A) (by C-G formula based on SCr of 1.58 mg/dL (H)).   Medical History: Past Medical History:  Diagnosis Date  . BPH (benign prostatic hyperplasia)   . CAD (coronary artery disease)    a. NSTEMI troponin >65 with cath 11/2016 S/p PTCA & DES to mid circumflex; irregularies in LAD & RCA, LVEDP 24, EF 50% and 45-50% by echo.  . Chronic chest pain   . Chronic combined systolic and diastolic CHF (congestive heart failure) ([place])    a. EF 45% in 2018. b. EF 55-60% by echo 01/2017. c. EF 48% by nuc 07/2017.  . CKD (chronic kidney disease), stage II   . Cluster headache    hx of  . Complication of anesthesia    difficult waking up"  . Diverticulosis   . Drug abuse ([place])    hx of  . GERD (gastroesophageal reflux disease)   . Hiatal hernia   . Hyperlipidemia   . Hypertension   . Hypothyroidism   . Nontoxic multinodular goiter   . NSTEMI (non-ST elevated myocardial infarction) ([place])   . Perforated nasal septum   . Pulmonary nodule --- CT 10/19/2014: Nodule is stable, no further routine x-rays  01/06/2009  . TIA (transient ischemic attack)   . Trigeminal neuralgia   . Tubular adenoma of colon 12/2015  . Unspecified asthma(493.90)     Medications:  Infusions:  . heparin 1,300 Units/hr (06/08/18 1751)  . nitroGLYCERIN 65 mcg/min (06/09/18 0851)    Assessment: 34 yom presented to the ED with heart palpitations. To start IV heparin. Baseline Hgb is WNL and platelets are slightly low. He is not on anticoagulation PTA. Troponin is mildly elevated.   Heparin level therapeutic at low end of range at 0.32. Hg wnl, plt down 141>115. No bleed or issues with infusion per discussion with RN.  Goal of Therapy:  Heparin level 0.3-0.7 units/ml Monitor platelets by anticoagulation protocol: Yes   Plan:  Increase heparin gtt slightly to 1350 units/hr to ensure stays in range Monitor daily heparin level and CBC, s/sx bleeding Cath planned on Monday  Elicia Lamp, PharmD, BCPS Please check AMION for all [facility] contact numbers Clinical Pharmacist 06/09/2018 10:28 AM

## 2018-06-09 NOTE — H&P (View-Only) (Signed)
Progress Note  Patient Name: [name] Date of Encounter: 06/09/2018   Primary Cardiologist: Ena Dawley, MD    Subjective   No pain concerned about palpitations prior to admission but no arrhythmia on monitor to date  Inpatient Medications    Scheduled Meds: . amLODipine  10 mg Oral Daily  . aspirin EC  81 mg Oral Daily  . carvedilol  9.375 mg Oral BID WC  . clopidogrel  75 mg Oral Daily  . ezetimibe  10 mg Oral Daily  . FLUoxetine  10 mg Oral Daily  . fluticasone  2 spray Each Nare Daily  . gabapentin  300 mg Oral BID  . loratadine  10 mg Oral QPM  . losartan  100 mg Oral Daily  . pantoprazole  40 mg Oral Daily  . ranolazine  500 mg Oral BID  . rosuvastatin  40 mg Oral QHS  . sodium chloride flush  3 mL Intravenous Once   Continuous Infusions: . heparin 1,350 Units/hr (06/09/18 1047)  . nitroGLYCERIN 65 mcg/min (06/09/18 0851)   PRN Meds: acetaminophen, alum & mag hydroxide-simeth, ketorolac, nitroGLYCERIN, ondansetron (ZOFRAN) IV, promethazine   Vital Signs    Vitals:   06/09/18 0548 06/09/18 0848 06/09/18 1156 06/09/18 1235  BP:  119/72 108/72 111/84  Pulse:  71  75  Resp:      Temp:  97.7 F (36.5 C)    TempSrc:  Oral    SpO2:  96%  95%  Weight: 133.5 kg     Height:        Intake/Output Summary (Last 24 hours) at 06/09/2018 1243 Last data filed at 06/09/2018 0700 Gross per 24 hour  Intake 1163.67 ml  Output -  Net 1163.67 ml   Last 3 Weights 06/09/2018 06/08/2018 06/07/2018  Weight (lbs) 294 lb 6.4 oz 249 lb 3.2 oz 253 lb 6 oz  Weight (kg) 133.539 kg 113.036 kg 114.93 kg      Telemetry    NSR  - Personally Reviewed  ECG    NSR no acute ST changes  - Personally Reviewed  Physical Exam  Overweight black male  GEN: No acute distress.   Neck: No JVD Cardiac: RRR, no murmurs, rubs, or gallops.  Respiratory: Clear to auscultation bilaterally. GI: Soft, nontender, non-distended  MS: No edema; No deformity. Neuro:  Nonfocal  Psych:  Normal affect   Labs    Chemistry Recent Labs  Lab 06/07/18 1443 06/07/18 2256  NA 141 136  K 4.1 4.0  CL 98 100  CO2 24 19*  GLUCOSE 108* 103*  BUN 23 23*  CREATININE 1.76* 1.58*  CALCIUM 9.5 9.4  GFRNONAA 43* 49*  GFRAA 49* 56*  ANIONGAP  --  17*     Hematology Recent Labs  Lab 06/07/18 1443 06/07/18 2256 06/09/18 0448  WBC 6.6 8.6 5.6  RBC 4.73 4.83 4.11*  HGB 15.7 15.6 13.8  HCT 43.7 46.5 39.6  MCV 92 96.3 96.4  MCH 33.2* 32.3 33.6  MCHC 35.9* 33.5 34.8  RDW 15.1 14.0 14.1  PLT 136* 141* 115*    Cardiac Enzymes Recent Labs  Lab 06/07/18 2256 06/08/18 0315 06/08/18 1032 06/08/18 1623  TROPONINI 0.03* <0.03 <0.03 <0.03   No results for input(s): TROPIPOC in the last 168 hours.   BNPNo results for input(s): BNP, PROBNP in the last 168 hours.   DDimer No results for input(s): DDIMER in the last 168 hours.   Radiology    Dg Chest 2 View  Result  Date: 06/07/2018 CLINICAL DATA:  Heart palpitations EXAM: CHEST - 2 VIEW COMPARISON:  Chest radiograph 07/22/2017 FINDINGS: Mild cardiomegaly. No focal airspace consolidation or pulmonary edema. No pleural effusion or pneumothorax. IMPRESSION: No active cardiopulmonary disease. Electronically Signed   By: Ulyses Jarred M.D.   On: 06/07/2018 23:05    Cardiac Studies   06/05/18 Myovue    The left ventricular ejection fraction is moderately decreased (30-44%).  Nuclear stress EF: 43%.  Blood pressure demonstrated a hypertensive response to stress.  There was no ST segment deviation noted during stress.  Defect 1: There is a small defect of mild severity present in the apical inferior and apex location.  Defect 2: There is a small defect of moderate severity present in the apical anterior and apex location.  Defect 3: There is a small defect of moderate severity present in the basal inferolateral location.  Findings consistent with ischemia and prior myocardial infarction with peri-infarct  ischemia.  This is an intermediate risk study.   Ejection fraction is reduced with global hypokinesis.  There is a small moderate partially reversible defect in the anteroapex that goes to mild and small moderate partially reversible defect in the basal inferolateral wall that goes to mild.  The suggest possible peri-infarct ischemia.  There is a reversible mild defect in the inferoapex that returns to normal.  This suggests ischemia.  Prior exam noted in medical record documentation is not available for review.  Patient Profile     [name] is a 58 y.o. male with history of HTN, Chronic HFpef, DLD, CAD status post and STEMI treated with DES to circumflex 11/2016 with irregularityin the LAD and RCA but no obstructive lesions, repeat cath 1 week later showed patent stent, follow-up echo 01/2017 LVEF improved to 55 to 60%, chest pain 07/2017 Lexiscan Myoview no ischemia LVEF 48% with fixed perfusion defect of the inferior wall stable  Assessment & Plan    Chest Pain:  Not clear that he is having acute coronary syndrome Prolonged pain but negative troponin x 3 and no acute ECG changes.  Had DES to circumflex August 2018 without significant disease in RCA and LAD. Myovue 07/23/17 considered low risk but did have peri infarct ischemia see above. Continue heparin and iv nitro Avoid MSO4 ? Drug seeking behavior can use Toradol for pain Cath scheduled for tomorrow with [place] [facility] at noon.   Palpitations: no arrhythmia on monitor continue beta blocker        For questions or updates, please contact [place] Please consult [email or web address] for contact info under        Signed, Jenkins Rouge, MD  06/09/2018, 12:43 PM

## 2018-06-10 ENCOUNTER — Inpatient Hospital Stay (HOSPITAL_COMMUNITY): Payer: 59

## 2018-06-10 ENCOUNTER — Other Ambulatory Visit (HOSPITAL_COMMUNITY): Payer: Self-pay

## 2018-06-10 ENCOUNTER — Other Ambulatory Visit: Payer: Self-pay

## 2018-06-10 ENCOUNTER — Encounter (HOSPITAL_COMMUNITY)
Admission: EM | Disposition: A | Discharge status: Home / Self Care | Payer: Self-pay | Source: Home / Self Care | Attending: Internal Medicine

## 2018-06-10 DIAGNOSIS — I25110 Atherosclerotic heart disease of native coronary artery with unstable angina pectoris: Principal | ICD-10-CM

## 2018-06-10 DIAGNOSIS — Z95820 Peripheral vascular angioplasty status with implants and grafts: Secondary | ICD-10-CM

## 2018-06-10 DIAGNOSIS — E785 Hyperlipidemia, unspecified: Secondary | ICD-10-CM

## 2018-06-10 DIAGNOSIS — R079 Chest pain, unspecified: Secondary | ICD-10-CM

## 2018-06-10 HISTORY — PX: CORONARY STENT INTERVENTION: CATH118234

## 2018-06-10 HISTORY — PX: LEFT HEART CATH AND CORONARY ANGIOGRAPHY: CATH118249

## 2018-06-10 LAB — HEPARIN LEVEL (UNFRACTIONATED): Heparin Unfractionated: 0.31 IU/mL (ref 0.30–0.70)

## 2018-06-10 LAB — BASIC METABOLIC PANEL
Anion gap: 11 (ref 5–15)
BUN: 22 mg/dL — ABNORMAL HIGH (ref 6–20)
CO2: 22 mmol/L (ref 22–32)
Calcium: 8.6 mg/dL — ABNORMAL LOW (ref 8.9–10.3)
Chloride: 102 mmol/L (ref 98–111)
Creatinine, Ser: 1.53 mg/dL — ABNORMAL HIGH (ref 0.61–1.24)
GFR calc Af Amer: 58 mL/min — ABNORMAL LOW (ref >60)
GFR calc non Af Amer: 50 mL/min — ABNORMAL LOW (ref >60)
Glucose, Bld: 111 mg/dL — ABNORMAL HIGH (ref 70–99)
Potassium: 3.7 mmol/L (ref 3.5–5.1)
Sodium: 135 mmol/L (ref 135–145)

## 2018-06-10 LAB — CBC
HCT: 38.9 % — ABNORMAL LOW (ref 39.0–52.0)
Hemoglobin: 13.2 g/dL (ref 13.0–17.0)
MCH: 32.4 pg (ref 26.0–34.0)
MCHC: 33.9 g/dL (ref 30.0–36.0)
MCV: 95.6 fL (ref 80.0–100.0)
Platelets: 103 10*3/uL — ABNORMAL LOW (ref 150–400)
RBC: 4.07 MIL/uL — ABNORMAL LOW (ref 4.22–5.81)
RDW: 13.7 % (ref 11.5–15.5)
WBC: 5.1 10*3/uL (ref 4.0–10.5)
nRBC: 0 % (ref 0.0–0.2)

## 2018-06-10 LAB — ECHOCARDIOGRAM COMPLETE
Height: 70 in
Weight: 4033.6 oz

## 2018-06-10 LAB — POCT ACTIVATED CLOTTING TIME
Activated Clotting Time: 296 seconds
Activated Clotting Time: 301 seconds

## 2018-06-10 SURGERY — LEFT HEART CATH AND CORONARY ANGIOGRAPHY
Anesthesia: LOCAL

## 2018-06-10 MED ORDER — HYDRALAZINE HCL 20 MG/ML IJ SOLN: 5.0000 mg | INTRAMUSCULAR | Status: AC | PRN

## 2018-06-10 MED ORDER — HEPARIN (PORCINE) IN NACL 1000-0.9 UT/500ML-% IV SOLN
INTRAVENOUS | Status: DC | PRN
  Administered 2018-06-10: 500 mL

## 2018-06-10 MED ORDER — HEPARIN SODIUM (PORCINE) 1000 UNIT/ML IJ SOLN
INTRAMUSCULAR | Status: AC
  Filled 2018-06-10: qty 1

## 2018-06-10 MED ORDER — LIDOCAINE HCL (PF) 1 % IJ SOLN
INTRAMUSCULAR | Status: DC | PRN
  Administered 2018-06-10: 2 mL

## 2018-06-10 MED ORDER — CLOPIDOGREL BISULFATE 300 MG PO TABS
ORAL_TABLET | ORAL | Status: AC
  Filled 2018-06-10: qty 1

## 2018-06-10 MED ORDER — FENTANYL CITRATE (PF) 100 MCG/2ML IJ SOLN
INTRAMUSCULAR | Status: DC | PRN
  Administered 2018-06-10: 25 ug via INTRAVENOUS
  Administered 2018-06-10: 50 ug via INTRAVENOUS
  Administered 2018-06-10: 25 ug via INTRAVENOUS

## 2018-06-10 MED ORDER — ASPIRIN 81 MG PO CHEW
81.0000 mg | CHEWABLE_TABLET | ORAL | Status: AC
  Administered 2018-06-10: 81 mg via ORAL
  Filled 2018-06-10: qty 1

## 2018-06-10 MED ORDER — CLOPIDOGREL BISULFATE 75 MG PO TABS
75.0000 mg | ORAL_TABLET | Freq: Every day | ORAL | Status: DC
  Administered 2018-06-11: 08:00:00 75 mg via ORAL
  Filled 2018-06-10: qty 1

## 2018-06-10 MED ORDER — NITROGLYCERIN 1 MG/10 ML FOR IR/CATH LAB
INTRA_ARTERIAL | Status: DC | PRN
  Administered 2018-06-10 (×2): 100 ug via INTRACORONARY

## 2018-06-10 MED ORDER — INFLUENZA VAC SPLIT QUAD 0.5 ML IM SUSY: 0.5000 mL | PREFILLED_SYRINGE | INTRAMUSCULAR | Status: DC

## 2018-06-10 MED ORDER — MIDAZOLAM HCL 2 MG/2ML IJ SOLN
INTRAMUSCULAR | Status: DC | PRN
  Administered 2018-06-10 (×2): 1 mg via INTRAVENOUS

## 2018-06-10 MED ORDER — SODIUM CHLORIDE 0.9% FLUSH
3.0000 mL | Freq: Two times a day (BID) | INTRAVENOUS | Status: DC
  Administered 2018-06-10: 3 mL via INTRAVENOUS

## 2018-06-10 MED ORDER — ASPIRIN EC 81 MG PO TBEC
81.0000 mg | DELAYED_RELEASE_TABLET | Freq: Every day | ORAL | Status: DC
  Administered 2018-06-11: 81 mg via ORAL
  Filled 2018-06-10: qty 1

## 2018-06-10 MED ORDER — SODIUM CHLORIDE 0.9 % WEIGHT BASED INFUSION: 1.0000 mL/kg/h | INTRAVENOUS | Status: DC

## 2018-06-10 MED ORDER — HEPARIN (PORCINE) IN NACL 1000-0.9 UT/500ML-% IV SOLN
INTRAVENOUS | Status: DC | PRN
  Administered 2018-06-10 (×2): 500 mL

## 2018-06-10 MED ORDER — SODIUM CHLORIDE 0.9% FLUSH: 3.0000 mL | INTRAVENOUS | Status: DC | PRN

## 2018-06-10 MED ORDER — SODIUM CHLORIDE 0.9 % IV SOLN: 250.0000 mL | INTRAVENOUS | Status: DC | PRN

## 2018-06-10 MED ORDER — SODIUM CHLORIDE 0.9 % IV SOLN
INTRAVENOUS | Status: AC
  Administered 2018-06-10: 17:00:00 via INTRAVENOUS

## 2018-06-10 MED ORDER — VERAPAMIL HCL 2.5 MG/ML IV SOLN
INTRAVENOUS | Status: DC | PRN
  Administered 2018-06-10: 10 mL via INTRA_ARTERIAL

## 2018-06-10 MED ORDER — HEPARIN (PORCINE) IN NACL 1000-0.9 UT/500ML-% IV SOLN
INTRAVENOUS | Status: AC
  Filled 2018-06-10: qty 500

## 2018-06-10 MED ORDER — HEPARIN SODIUM (PORCINE) 5000 UNIT/ML IJ SOLN
5000.0000 [IU] | Freq: Three times a day (TID) | INTRAMUSCULAR | Status: DC
  Administered 2018-06-10: 22:00:00 5000 [IU] via SUBCUTANEOUS
  Filled 2018-06-10: qty 1

## 2018-06-10 MED ORDER — IOHEXOL 350 MG/ML SOLN
INTRAVENOUS | Status: DC | PRN
  Administered 2018-06-10: 110 mL via INTRA_ARTERIAL

## 2018-06-10 MED ORDER — VERAPAMIL HCL 2.5 MG/ML IV SOLN
INTRAVENOUS | Status: AC
  Filled 2018-06-10: qty 2

## 2018-06-10 MED ORDER — LABETALOL HCL 5 MG/ML IV SOLN: 10.0000 mg | INTRAVENOUS | Status: AC | PRN

## 2018-06-10 MED ORDER — CLOPIDOGREL BISULFATE 300 MG PO TABS
ORAL_TABLET | ORAL | Status: DC | PRN
  Administered 2018-06-10: 300 mg via ORAL

## 2018-06-10 MED ORDER — LIDOCAINE HCL (PF) 1 % IJ SOLN
INTRAMUSCULAR | Status: AC
  Filled 2018-06-10: qty 30

## 2018-06-10 MED ORDER — HEPARIN SODIUM (PORCINE) 1000 UNIT/ML IJ SOLN
INTRAMUSCULAR | Status: DC | PRN
  Administered 2018-06-10: 2000 [IU] via INTRAVENOUS
  Administered 2018-06-10: 6000 [IU] via INTRAVENOUS
  Administered 2018-06-10: 5000 [IU] via INTRAVENOUS

## 2018-06-10 MED ORDER — NITROGLYCERIN 1 MG/10 ML FOR IR/CATH LAB
INTRA_ARTERIAL | Status: AC
  Filled 2018-06-10: qty 10

## 2018-06-10 MED ORDER — THE SENSUOUS HEART BOOK
Freq: Once | Status: AC
  Administered 2018-06-10: 22:00:00
  Filled 2018-06-10: qty 1

## 2018-06-10 MED ORDER — SODIUM CHLORIDE 0.9 % WEIGHT BASED INFUSION
3.0000 mL/kg/h | INTRAVENOUS | Status: DC
  Administered 2018-06-10: 3 mL/kg/h via INTRAVENOUS

## 2018-06-10 MED ORDER — MIDAZOLAM HCL 2 MG/2ML IJ SOLN
INTRAMUSCULAR | Status: AC
  Filled 2018-06-10: qty 2

## 2018-06-10 MED ORDER — FENTANYL CITRATE (PF) 100 MCG/2ML IJ SOLN
INTRAMUSCULAR | Status: AC
  Filled 2018-06-10: qty 2

## 2018-06-10 MED ORDER — ANGIOPLASTY BOOK
Freq: Once | Status: AC
  Administered 2018-06-10: 22:00:00
  Filled 2018-06-10: qty 1

## 2018-06-10 MED ORDER — CARVEDILOL 12.5 MG PO TABS
12.5000 mg | ORAL_TABLET | Freq: Two times a day (BID) | ORAL | Status: DC
  Administered 2018-06-10 – 2018-06-11 (×2): 12.5 mg via ORAL
  Filled 2018-06-10 (×2): qty 1

## 2018-06-10 SURGICAL SUPPLY — 18 items
BALLN EMERGE MR 2.5X8 (BALLOONS) ×2
BALLN SAPPHIRE ~~LOC~~ 3.5X12 (BALLOONS) ×2 IMPLANT
BALLN WOLVERINE 3.00X10 (BALLOONS) ×2
BALLOON EMERGE MR 2.5X8 (BALLOONS) ×1 IMPLANT
BALLOON WOLVERINE 3.00X10 (BALLOONS) ×1 IMPLANT
CATH LAUNCHER 6FR JR4 (CATHETERS) ×2 IMPLANT
CATH OPTITORQUE TIG 4.0 5F (CATHETERS) ×2 IMPLANT
DEVICE RAD COMP TR BAND LRG (VASCULAR PRODUCTS) ×2 IMPLANT
GLIDESHEATH SLEND SS 6F .021 (SHEATH) ×2 IMPLANT
GUIDEWIRE INQWIRE 1.5J.035X260 (WIRE) ×1 IMPLANT
INQWIRE 1.5J .035X260CM (WIRE) ×2
KIT ENCORE 26 ADVANTAGE (KITS) ×4 IMPLANT
KIT HEART LEFT (KITS) ×2 IMPLANT
PACK CARDIAC CATHETERIZATION (CUSTOM PROCEDURE TRAY) ×2 IMPLANT
STENT RESOLUTE ONYX 3.5X15 (Permanent Stent) ×2 IMPLANT
TRANSDUCER W/STOPCOCK (MISCELLANEOUS) ×2 IMPLANT
TUBING CIL FLEX 10 FLL-RA (TUBING) ×2 IMPLANT
WIRE MINAMO 190 (WIRE) ×2 IMPLANT

## 2018-06-10 NOTE — Progress Notes (Signed)
[place] for heparin Indication: chest pain/ACS  Allergies  Allergen Reactions  . Tramadol Palpitations    Patient Measurements: Height: 5\' 10"  (177.8 cm) Weight: 252 lb 1.6 oz (114.4 kg) IBW/kg (Calculated) : 73 Heparin Dosing Weight: 97kg  Vital Signs: Temp: 97.3 F (36.3 C) (02/17 0826) Temp Source: Oral (02/17 0826) BP: 115/89 (02/17 0811) Pulse Rate: 73 (02/17 0826)  Labs: Recent Labs    06/07/18 1443  06/07/18 2256 06/08/18 0315 06/08/18 1032 06/08/18 1623 06/09/18 0448 06/10/18 0703  HGB 15.7  --  15.6  --   --   --  13.8  --   HCT 43.7  --  46.5  --   --   --  39.6  --   PLT 136*  --  141*  --   --   --  115*  --   HEPARINUNFRC  --   --   --   --  0.44  --  0.32 0.31  CREATININE 1.76*  --  1.58*  --   --   --   --  1.53*  TROPONINI  --    < > 0.03* <0.03 <0.03 <0.03  --   --    < > = values in this interval not displayed.    Estimated Creatinine Clearance: 69.1 mL/min (A) (by C-G formula based on SCr of 1.53 mg/dL (H)).   Medical History: Past Medical History:  Diagnosis Date  . BPH (benign prostatic hyperplasia)   . CAD (coronary artery disease)    a. NSTEMI troponin >65 with cath 11/2016 S/p PTCA & DES to mid circumflex; irregularies in LAD & RCA, LVEDP 24, EF 50% and 45-50% by echo.  . Chronic chest pain   . Chronic combined systolic and diastolic CHF (congestive heart failure) ([place])    a. EF 45% in 2018. b. EF 55-60% by echo 01/2017. c. EF 48% by nuc 07/2017.  . CKD (chronic kidney disease), stage II   . Cluster headache    hx of  . Complication of anesthesia    difficult waking up"  . Diverticulosis   . Drug abuse ([place])    hx of  . GERD (gastroesophageal reflux disease)   . Hiatal hernia   . Hyperlipidemia   . Hypertension   . Hypothyroidism   . Nontoxic multinodular goiter   . NSTEMI (non-ST elevated myocardial infarction) ([place])   . Perforated nasal septum   . Pulmonary nodule --- CT 10/19/2014:  Nodule is stable, no further routine x-rays 01/06/2009  . TIA (transient ischemic attack)   . Trigeminal neuralgia   . Tubular adenoma of colon 12/2015  . Unspecified asthma(493.90)     Medications:  Infusions:  . sodium chloride    . sodium chloride 1 mL/kg/hr (06/10/18 0654)  . heparin 1,350 Units/hr (06/10/18 0558)  . nitroGLYCERIN 70 mcg/min (06/10/18 0810)    Assessment: 56 yom presented to the ED with heart palpitations and CP. On IV heparin (not on anticoagulation PTA). Plans for cath today -heparin level at goal, CBC stable   Goal of Therapy:  Heparin level 0.3-0.7 units/ml Monitor platelets by anticoagulation protocol: Yes   Plan:  -No heparin changes needed -Daily heparin level and CBC -Will follow plans post cath  Hildred Laser, PharmD Clinical Pharmacist **Pharmacist phone directory can now be found on [email or web address] ([email or web address]).  Listed under [place].

## 2018-06-10 NOTE — Progress Notes (Signed)
  Echocardiogram 2D Echocardiogram has been performed.  [name] 06/10/2018, 3:40 PM

## 2018-06-10 NOTE — Progress Notes (Addendum)
The patient has been seen in conjunction with Reino Bellis, NP. All aspects of care have been considered and discussed. The patient has been personally interviewed, examined, and all clinical data has been reviewed.   The patient is stable.  Successful PCI earlier today.  Plan discharge tomorrow if no clinical problems.  Progress Note  Patient Name: [name] Date of Encounter: 06/10/2018  Primary Cardiologist: Ena Dawley, MD   Subjective   Still with intermittent chest pain while on nitro, but improved since admission.   Inpatient Medications    Scheduled Meds: . amLODipine  10 mg Oral Daily  . [START ON 06/11/2018] aspirin EC  81 mg Oral Daily  . carvedilol  12.5 mg Oral BID WC  . clopidogrel  75 mg Oral Daily  . ezetimibe  10 mg Oral Daily  . FLUoxetine  10 mg Oral Daily  . fluticasone  2 spray Each Nare Daily  . gabapentin  300 mg Oral BID  . [START ON 06/11/2018] Influenza vac split quadrivalent PF  0.5 mL Intramuscular Tomorrow-1000  . loratadine  10 mg Oral QPM  . losartan  100 mg Oral Daily  . pantoprazole  40 mg Oral Daily  . ranolazine  500 mg Oral BID  . rosuvastatin  40 mg Oral QHS  . sodium chloride flush  3 mL Intravenous Once  . sodium chloride flush  3 mL Intravenous Q12H   Continuous Infusions: . sodium chloride    . sodium chloride 1 mL/kg/hr (06/10/18 0654)  . heparin 1,350 Units/hr (06/10/18 0558)  . nitroGLYCERIN 70 mcg/min (06/10/18 0810)   PRN Meds: sodium chloride, acetaminophen, alum & mag hydroxide-simeth, ketorolac, nitroGLYCERIN, ondansetron (ZOFRAN) IV, promethazine, sodium chloride flush   Vital Signs    Vitals:   06/09/18 2357 06/10/18 0415 06/10/18 0811 06/10/18 0826  BP: 111/75 102/70 115/89   Pulse: 71 72 70 73  Resp:      Temp: 97.8 F (36.6 C) 98 F (36.7 C)  (!) 97.3 F (36.3 C)  TempSrc: Oral Oral  Oral  SpO2: 90% 91%  95%  Weight:  114.4 kg    Height:        Intake/Output Summary (Last 24 hours) at  06/10/2018 0907 Last data filed at 06/10/2018 0700 Gross per 24 hour  Intake 1276.1 ml  Output -  Net 1276.1 ml   Last 3 Weights 06/10/2018 06/09/2018 06/08/2018  Weight (lbs) 252 lb 1.6 oz 294 lb 6.4 oz 249 lb 3.2 oz  Weight (kg) 114.352 kg 133.539 kg 113.036 kg      Telemetry    SR - Personally Reviewed  ECG    SR with TWI in lateral leads - Personally Reviewed  Physical Exam   GEN: No acute distress.   Neck: No JVD Cardiac: RRR, no murmurs, rubs, or gallops.  Respiratory: Clear to auscultation bilaterally. GI: Soft, nontender, non-distended  MS: No edema; No deformity. Neuro:  Nonfocal  Psych: Normal affect   Labs    Chemistry Recent Labs  Lab 06/07/18 1443 06/07/18 2256 06/10/18 0703  NA 141 136 135  K 4.1 4.0 3.7  CL 98 100 102  CO2 24 19* 22  GLUCOSE 108* 103* 111*  BUN 23 23* 22*  CREATININE 1.76* 1.58* 1.53*  CALCIUM 9.5 9.4 8.6*  GFRNONAA 43* 49* 50*  GFRAA 49* 56* 58*  ANIONGAP  --  17* 11     Hematology Recent Labs  Lab 06/07/18 1443 06/07/18 2256 06/09/18 0448  WBC 6.6 8.6 5.6  RBC 4.73 4.83 4.11*  HGB 15.7 15.6 13.8  HCT 43.7 46.5 39.6  MCV 92 96.3 96.4  MCH 33.2* 32.3 33.6  MCHC 35.9* 33.5 34.8  RDW 15.1 14.0 14.1  PLT 136* 141* 115*    Cardiac Enzymes Recent Labs  Lab 06/07/18 2256 06/08/18 0315 06/08/18 1032 06/08/18 1623  TROPONINI 0.03* <0.03 <0.03 <0.03   No results for input(s): TROPIPOC in the last 168 hours.   BNPNo results for input(s): BNP, PROBNP in the last 168 hours.   DDimer No results for input(s): DDIMER in the last 168 hours.   Radiology    No results found.  Cardiac Studies     Patient Profile     58 y.o. male with history ofHTN, Chronic HFpef, DLD,CAD status post and STEMI treated with DES to circumflex 11/2016 with irregularityin the LAD and RCA but no obstructive lesions, repeat cath 1 week later showed patent stent, follow-up echo 01/2017 LVEF improved to 55 to 60%, chest pain 07/2017 Lexiscan  Myoview no ischemia LVEF 48% with fixed perfusion defect of the inferior wall stable. Admitted with chest pain and planned for cardiac cath.   Assessment & Plan    1. Chest Pain: Neg troponins, but continues to have intermittent chest pain while on nitro drip. Planned for cardiac cath today.  -- continue IV heparin  2. HL: LDL above goal. Crestor increased to 40mg  this admission.  3. HTN: stable with current therapy.  For questions or updates, please contact [place] Please consult [email or web address] for contact info under      Signed, Reino Bellis, NP  06/10/2018, 9:07 AM

## 2018-06-10 NOTE — Progress Notes (Signed)
Patient has remained pain free and his nitroglycerin infusion is being titrated down with no chest pain and maintaining an appropriate blood pressure. Will continue to decrease as patient tolerates. Patient had reported a headache on arrival to unit and reviewed plan to decrease nitro infusion which may be contributing to this. Patient verbalized good understanding of plan of care after reviewed, including explanation of TR band application, blood thinner and removal of band, IV fluids and treatment to avoid injury to kidneys and titration of nitroglycerin which was treating chest pain and importance of reporting any recurring chest pain. Patient aware he needs to call for assist to get out of bed and needs to have his urine saved to record output.

## 2018-06-10 NOTE — Progress Notes (Signed)
TR BAND REMOVAL  LOCATION:    right radial  DEFLATED PER PROTOCOL:    Yes.    TIME BAND OFF / DRESSING APPLIED:   1600 SITE UPON ARRIVAL:    Level 0  SITE AFTER BAND REMOVAL:    Level 0  CIRCULATION SENSATION AND MOVEMENT:    Within Normal Limits   Yes.    COMMENTS:   Rechecked site often during shift with no change noted

## 2018-06-10 NOTE — Brief Op Note (Signed)
BRIEF CARDIAC CATHETERIZATION NOTE  06/10/2018  11:38 AM  PATIENT:  [name]  58 y.o. male  PRE-OPERATIVE DIAGNOSIS:  unstable angina  POST-OPERATIVE DIAGNOSIS:  same  PROCEDURE:  Procedure(s): LEFT HEART CATH AND CORONARY ANGIOGRAPHY (N/A) CORONARY STENT INTERVENTION (N/A)  SURGEON:  Surgeon(s) and Role:    * Zooey Schreurs, Harrell Gave, MD - Primary  FINDINGS: 1. Significant 2-vessel CAD involving mid RCA 75% and inferior branch of OM2 (70%). 2. Moderate, non-obstructive LAD disease. 3. Widely patent OM2 stent. 4. Normal LVEDP. 5. Successful PCI to mid RCA using Resolute Onyx 3.5 x 15 mm DES with 0% residual stenosis and TIMI-3 flow.  RECOMMENDATIONS: 1. Continue DAPT with aspirin and clopidogrel for at least 12 months, ideally longer. 2. Wean NTG infusion as tolerated.  If patient has recurrent chest pain refractory to antianginal therapy, PCI to inferior branch of OM2 could be considered (this is a small vessel). 3. Aggressive secondary prevention. 4. Follow-up echo for evaluation of LVEF.  Nelva Bush, MD [facility] Pager: [phone]

## 2018-06-10 NOTE — Interval H&P Note (Signed)
History and Physical Interval Note:  06/10/2018 10:21 AM  [name]  has presented today for cardiac catheterization, with the diagnosis of unstable angina  The various methods of treatment have been discussed with the patient and family. After consideration of risks, benefits and other options for treatment, the patient has consented to  Procedure(s): LEFT HEART CATH AND CORONARY ANGIOGRAPHY (N/A) as a surgical intervention .  The patient's history has been reviewed, patient examined, no change in status, stable for surgery.  I have reviewed the patient's chart and labs.  Questions were answered to the patient's satisfaction.    Cath Lab Visit (complete for each Cath Lab visit)  Clinical Evaluation Leading to the Procedure:   ACS: Yes.    Non-ACS:  N/A  Keely Drennan

## 2018-06-11 ENCOUNTER — Ambulatory Visit: Payer: Self-pay | Admitting: Cardiology

## 2018-06-11 ENCOUNTER — Encounter

## 2018-06-11 ENCOUNTER — Ambulatory Visit (HOSPITAL_COMMUNITY): Admission: RE | Admit: 2018-06-11 | Payer: 59 | Source: Home / Self Care | Admitting: Interventional Cardiology

## 2018-06-11 ENCOUNTER — Encounter (HOSPITAL_COMMUNITY): Admission: RE | Payer: Self-pay | Source: Home / Self Care

## 2018-06-11 ENCOUNTER — Encounter (HOSPITAL_COMMUNITY): Payer: Self-pay | Admitting: Internal Medicine

## 2018-06-11 DIAGNOSIS — Z955 Presence of coronary angioplasty implant and graft: Secondary | ICD-10-CM

## 2018-06-11 DIAGNOSIS — I5032 Chronic diastolic (congestive) heart failure: Secondary | ICD-10-CM

## 2018-06-11 DIAGNOSIS — Z8679 Personal history of other diseases of the circulatory system: Secondary | ICD-10-CM

## 2018-06-11 LAB — BASIC METABOLIC PANEL
Anion gap: 11 (ref 5–15)
BUN: 15 mg/dL (ref 6–20)
CO2: 21 mmol/L — ABNORMAL LOW (ref 22–32)
Calcium: 9.3 mg/dL (ref 8.9–10.3)
Chloride: 104 mmol/L (ref 98–111)
Creatinine, Ser: 1.37 mg/dL — ABNORMAL HIGH (ref 0.61–1.24)
GFR calc Af Amer: >60 mL/min (ref >60)
GFR calc non Af Amer: 58 mL/min — ABNORMAL LOW (ref >60)
Glucose, Bld: 97 mg/dL (ref 70–99)
Potassium: 3.9 mmol/L (ref 3.5–5.1)
Sodium: 136 mmol/L (ref 135–145)

## 2018-06-11 LAB — CBC
HCT: 42.3 % (ref 39.0–52.0)
Hemoglobin: 14.4 g/dL (ref 13.0–17.0)
MCH: 32.6 pg (ref 26.0–34.0)
MCHC: 34 g/dL (ref 30.0–36.0)
MCV: 95.7 fL (ref 80.0–100.0)
Platelets: 113 10*3/uL — ABNORMAL LOW (ref 150–400)
RBC: 4.42 MIL/uL (ref 4.22–5.81)
RDW: 13.6 % (ref 11.5–15.5)
WBC: 4.9 10*3/uL (ref 4.0–10.5)
nRBC: 0 % (ref 0.0–0.2)

## 2018-06-11 SURGERY — LEFT HEART CATH AND CORONARY ANGIOGRAPHY
Anesthesia: LOCAL

## 2018-06-11 MED ORDER — CARVEDILOL 12.5 MG PO TABS: 12.5000 mg | ORAL_TABLET | Freq: Two times a day (BID) | ORAL | 1 refills | Status: DC

## 2018-06-11 MED ORDER — ROSUVASTATIN CALCIUM 40 MG PO TABS: 40.0000 mg | ORAL_TABLET | Freq: Every day | ORAL | 1 refills | Status: DC

## 2018-06-11 MED ORDER — RANOLAZINE ER 500 MG PO TB12: 500.0000 mg | ORAL_TABLET | Freq: Two times a day (BID) | ORAL | 1 refills | Status: DC

## 2018-06-11 MED FILL — RANOLAZINE ER 500 MG TABLET: 500 | 60 days supply | Qty: 60 | Fill #0 | Status: TO

## 2018-06-11 MED FILL — CARVEDILOL 12.5 MG TABLET: 12.5 | 30 days supply | Qty: 60 | Fill #0 | Status: TO

## 2018-06-11 MED FILL — ROSUVASTATIN CALCIUM 40 MG: 40 | 30 days supply | Qty: 30 | Fill #0 | Status: TO

## 2018-06-11 NOTE — Progress Notes (Signed)
CARDIAC REHAB PHASE I   PRE:  Rate/Rhythm: 74 SR  BP:  Supine:   Sitting: 154/114, 155/111  Standing:    SaO2:   MODE:  Ambulation: 550 ft   POST:  Rate/Rhythm: 83 SR  BP:  Supine:   Sitting: left 164/110, right 163/113  Standing:    SaO2: 99%RA [id number] Pt walked 550 ft on RA with steady gait. No CP. Slightly SOB. Sats good on RA. Cardiology aware of elevated BP. Stressed importance of plavix with stent. Reviewed NTG use, ex ed, heart healthy food choices and CRP 2. Pt attended [place] CRP 2 before. Referring back to [facility] CR.   Graylon Good, RN BSN  06/11/2018 8:52 AM

## 2018-06-11 NOTE — Discharge Summary (Signed)
Discharge Summary    Patient ID: [name],  MRN: [id number], DOB/AGE: September 29, 1960 58 y.o.  Admit date: 06/07/2018 Discharge date: 06/11/2018  Primary Care Provider: [name] Primary Cardiologist: Dr. Meda Coffee   Discharge Diagnoses    Active Problems:   Chest pain   Unstable angina ([place])   S/P angioplasty with stent   Chronic heart failure with preserved ejection fraction (HFpEF) (HCC)   Allergies Allergies  Allergen Reactions  . Tramadol Palpitations    Diagnostic Studies/Procedures    TTE: 06/10/2018  IMPRESSIONS   1. The left ventricle has normal systolic function with an ejection fraction of 60-65%. The cavity size was normal. There is mildly increased left ventricular wall thickness. Left ventricular diastolic Doppler parameters are consistent with impaired  relaxation Indeterminent filling pressures The E/e' is 8-15.  2. The right ventricle has normal systolic function. The cavity was normal. There is no increase in right ventricular wall thickness.  3. The mitral valve is normal in structure.  4. The tricuspid valve is normal in structure.  5. The aortic valve is normal in structure.  6. The aortic root and ascending aorta are normal in size and structure.  7. The interatrial septum was not well visualized.  8. When compared to the prior study: Compared to a prior study in 01/2017, there are no new changes.   Cath: 06/10/2018  Conclusions: 1. Significant two-vessel coronary artery disease involving mid RCA (75%) and inferior branch of OM2 (70%). 2. Moderate, non-obstructive LAD disease. 3. Widely patent OM2 stent. 4. Normal left ventricular filling pressure. 5. Successful PCI to mid RCA using Resolute Onyx 3.5 x 15 mm DES with 0% residual stenosis and TIMI-3 flow.  Recommendations: 1. Continue dual antiplatelet therapy with aspirin and clopidogrel for at least 12 months, ideally longer. 2. Wean nitroglycerin infusion as tolerated.  If patient has recurrent chest pain refractory to antianginal therapy, PCI to inferior branch of OM2 could be considered (this is a small vessel). 3. Aggressive secondary prevention. 4. Follow-up echo for evaluation of left ventricular systolic function.  Nelva Bush, MD _____________   History of Present Illness     58 y.o. male with history of HTN, Chronic HFpEF, DLD, CAD s/p and STEMI treated with DES to circumflex 11/2016 with irregularityin the LAD and RCA but no obstructive lesions, repeat cath 1 week later showed patent stent, follow-up echo 01/2017 LVEF improved to 55 to 60%, chest pain 07/2017 Lexiscan Myoview no ischemia LVEF 48% with fixed perfusion defect of the inferior wall stable.  [name] has the above history with recent nuclear stress which was abnormal with evidence of some ischemia along with infarction pattern.  He stated over the last few months he had noticed chest pain in the left chest which was new compared to his prior chest pain episodes when he had prior to her stent.  Episode was moderate intensity with radiation across left chest.  He was scheduled for cath on Tuesday due to abnormal nuclear stress test however due to these symptoms presented earlier to the ER.  Troponins were mildly elevated on initial check.  No significant EKG changes of concern.  Has been on aspirin and Plavix and the rest of his medicines. He was placed on IV nitro and heparin, and admitted for further work up.   Hospital Course     Consultants: None  Ranxea 500mg  BID added on admission. Underwent cardiac cath noted above with successful PCI/DES to the [facility] with patient stent  in the OM. Will plan to continue DAPT with ASA/Plavix for at least 12 months. He was able to wean from the IV nitro. Echo with normal EF and no WMA noted. LDL above goal at 174, therefore his Crestor was increased to 40mg  daily. Also his metoprolol was switched to coreg and titrated to 25mg  BID with better blood  pressure control.   General: Well developed, well nourished, male appearing in no acute distress. Head: Normocephalic, atraumatic.  Neck: Supple without bruits, JVD. Lungs:  Resp regular and unlabored, CTA. Heart: RRR, S1, S2, no S3, S4, or murmur; no rub. Abdomen: Soft, non-tender, non-distended with normoactive bowel sounds. No hepatomegaly. No rebound/guarding. No obvious abdominal masses. Extremities: No clubbing, cyanosis, edema. Distal pedal pulses are 2+ bilaterally. Right radial cath site stable without bruising or hematoma Neuro: Alert and oriented X 3. Moves all extremities spontaneously. Psych: Normal affect.  [name] was seen by Dr. Tamala Julian and determined stable for discharge home. Follow up in the office has been arranged. Medications are listed below.   _____________  Discharge Vitals Blood pressure (!) 148/99, pulse 73, temperature (!) 97.4 F (36.3 C), temperature source Oral, resp. rate 14, height 5\' 10"  (1.778 m), weight 112.7 kg, SpO2 98 %.  Filed Weights   06/09/18 0548 06/10/18 0415 06/11/18 0604  Weight: 133.5 kg 114.4 kg 112.7 kg    Labs & Radiologic Studies    CBC Recent Labs    06/10/18 0703 06/11/18 0320  WBC 5.1 4.9  HGB 13.2 14.4  HCT 38.9* 42.3  MCV 95.6 95.7  PLT 103* 277*   Basic Metabolic Panel Recent Labs    06/10/18 0703 06/11/18 0320  NA 135 136  K 3.7 3.9  CL 102 104  CO2 22 21*  GLUCOSE 111* 97  BUN 22* 15  CREATININE 1.53* 1.37*  CALCIUM 8.6* 9.3   Liver Function Tests No results for input(s): AST, ALT, ALKPHOS, BILITOT, PROT, ALBUMIN in the last 72 hours. No results for input(s): LIPASE, AMYLASE in the last 72 hours. Cardiac Enzymes Recent Labs    06/08/18 1032 06/08/18 1623  TROPONINI <0.03 <0.03   BNP Invalid input(s): POCBNP D-Dimer No results for input(s): DDIMER in the last 72 hours. Hemoglobin A1C No results for input(s): HGBA1C in the last 72 hours. Fasting Lipid Panel No results for input(s):  CHOL, HDL, LDLCALC, TRIG, CHOLHDL, LDLDIRECT in the last 72 hours. Thyroid Function Tests No results for input(s): TSH, T4TOTAL, T3FREE, THYROIDAB in the last 72 hours.  Invalid input(s): FREET3 _____________  Dg Chest 2 View  Result Date: 06/07/2018 CLINICAL DATA:  Heart palpitations EXAM: CHEST - 2 VIEW COMPARISON:  Chest radiograph 07/22/2017 FINDINGS: Mild cardiomegaly. No focal airspace consolidation or pulmonary edema. No pleural effusion or pneumothorax. IMPRESSION: No active cardiopulmonary disease. Electronically Signed   By: Ulyses Jarred M.D.   On: 06/07/2018 23:05   Disposition   Pt is being discharged home today in good condition.  Follow-up Plans & Appointments    Follow-up Information    Imogene Burn, PA-C Follow up on 06/26/2018.   Specialty:  Cardiology Why:  at 10am for your follow up appt.  Contact information: [address] [place] [address] [phone]          Discharge Instructions    Amb Referral to Cardiac Rehabilitation   Complete by:  As directed    Referring to [place] CRP 2   Diagnosis:  Coronary Stents   Diet - low sodium  heart healthy   Complete by:  As directed    Discharge instructions   Complete by:  As directed    Radial Site Care Refer to this sheet in the next few weeks. These instructions provide you with information on caring for yourself after your procedure. Your caregiver may also give you more specific instructions. Your treatment has been planned according to current medical practices, but problems sometimes occur. Call your caregiver if you have any problems or questions after your procedure. HOME CARE INSTRUCTIONS You may shower the day after the procedure.Remove the bandage (dressing) and gently wash the site with plain soap and water.Gently pat the site dry.  Do not apply powder or lotion to the site.  Do not submerge the affected site in water for 3 to 5 days.  Inspect the site at least twice  daily.  Do not flex or bend the affected arm for 24 hours.  No lifting over 5 pounds (2.3 kg) for 5 days after your procedure.  Do not drive home if you are discharged the same day of the procedure. Have someone else drive you.  You may drive 24 hours after the procedure unless otherwise instructed by your caregiver.  What to expect: Any bruising will usually fade within 1 to 2 weeks.  Blood that collects in the tissue (hematoma) may be painful to the touch. It should usually decrease in size and tenderness within 1 to 2 weeks.  SEEK IMMEDIATE MEDICAL CARE IF: You have unusual pain at the radial site.  You have redness, warmth, swelling, or pain at the radial site.  You have drainage (other than a small amount of blood on the dressing).  You have chills.  You have a fever or persistent symptoms for more than 72 hours.  You have a fever and your symptoms suddenly get worse.  Your arm becomes pale, cool, tingly, or numb.  You have heavy bleeding from the site. Hold pressure on the site.   PLEASE DO NOT MISS ANY DOSES OF YOUR PLAVIX!!!!! Also keep a log of you blood pressures and bring back to your follow up appt. Please call the office with any questions.   Patients taking blood thinners should generally stay away from medicines like ibuprofen, Advil, Motrin, naproxen, and Aleve due to risk of stomach bleeding. You may take Tylenol as directed or talk to your primary doctor about alternatives.   Increase activity slowly   Complete by:  As directed        Discharge Medications     Medication List    STOP taking these medications   metoprolol tartrate 25 MG tablet Commonly known as:  LOPRESSOR     TAKE these medications   amLODipine 10 MG tablet Commonly known as:  NORVASC Take 1 tablet (10 mg total) by mouth daily.   aspirin EC 81 MG tablet Take 81 mg by mouth daily.   carvedilol 12.5 MG tablet Commonly known as:  COREG Take 1 tablet (12.5 mg total) by mouth 2 (two) times  daily with a meal.   clopidogrel 75 MG tablet Commonly known as:  PLAVIX Take 75 mg by mouth daily.   ezetimibe 10 MG tablet Commonly known as:  ZETIA Take 1 tablet (10 mg total) by mouth daily.   FLUoxetine 20 MG tablet Commonly known as:  PROZAC Take 1 tablet (20 mg total) by mouth daily. Take 1/2 tab daily for the first 2 weeks. What changed:  additional instructions   fluticasone 50 MCG/ACT  nasal spray Commonly known as:  FLONASE Place 2 sprays into both nostrils daily.   gabapentin 300 MG capsule Commonly known as:  NEURONTIN Take 1 capsule (300 mg total) by mouth 2 (two) times daily.   lansoprazole 30 MG capsule Commonly known as:  PREVACID Take 1 capsule (30 mg total) by mouth 2 (two) times daily before a meal.   levocetirizine 5 MG tablet Commonly known as:  XYZAL Take 1 tablet (5 mg total) by mouth every evening.   losartan 100 MG tablet Commonly known as:  COZAAR TAKE 1 TABLET (100 MG)  BY MOUTH DAILY What changed:    how much to take  how to take this  when to take this  additional instructions   nitroGLYCERIN 0.4 MG SL tablet Commonly known as:  NITROSTAT Place 1 tablet (0.4 mg total) under the tongue every 5 (five) minutes as needed for chest pain.   promethazine 12.5 MG tablet Commonly known as:  PHENERGAN Take 12.5 mg by mouth every 6 (six) hours as needed for nausea or vomiting.   ranolazine 500 MG 12 hr tablet Commonly known as:  RANEXA Take 1 tablet (500 mg total) by mouth 2 (two) times daily.   rosuvastatin 40 MG tablet Commonly known as:  CRESTOR Take 1 tablet (40 mg total) by mouth at bedtime. What changed:    medication strength  how much to take   SYSTANE OP Place 1 drop into both eyes every 3 (three) hours as needed (dry eyes).        Acute coronary syndrome (MI, NSTEMI, STEMI, etc) this admission?: Yes.     AHA/ACC Clinical Performance & Quality Measures: 5. Aspirin prescribed? - Yes 6. ADP Receptor Inhibitor  (Plavix/Clopidogrel, Brilinta/Ticagrelor or Effient/Prasugrel) prescribed (includes medically managed patients)? - Yes 7. Beta Blocker prescribed? - Yes 8. High Intensity Statin (Lipitor 40-80mg  or Crestor 20-40mg ) prescribed? - Yes 9. EF assessed during THIS hospitalization? - Yes 10. For EF <40%, was ACEI/ARB prescribed? - Yes 11. For EF <40%, Aldosterone Antagonist (Spironolactone or Eplerenone) prescribed? - Not Applicable (EF >/= 30%) 12. Cardiac Rehab Phase II ordered (Included Medically managed Patients)? - Yes     Outstanding Labs/Studies   N/a   Duration of Discharge Encounter   Greater than 30 minutes including physician time.  Signed, Reino Bellis NP-C 06/11/2018, 9:15 AM

## 2018-06-13 ENCOUNTER — Telehealth: Payer: Self-pay | Admitting: Cardiology

## 2018-06-13 NOTE — Telephone Encounter (Signed)
The nurse who discharged the patient 2 days ago, Mickel Baas, just informed me that the patient told her that he was not tolerating the gabapentin well. He reported dizziness to her and he does not like the way it feels. This was initially started last year for possible neuropathic cause of chest pain (per Dr. Debara Pickett). He now has undergone stenting and may be able to wean off gabapentin.  I will forward this to Estella Husk, PA to address at pt hospital follow up on 06/25/18.

## 2018-06-21 ENCOUNTER — Encounter: Payer: Self-pay | Admitting: Family Medicine

## 2018-06-21 ENCOUNTER — Ambulatory Visit: Payer: 59 | Admitting: Family Medicine

## 2018-06-21 VITALS — BP 120/88 | HR 69 | Temp 97.6°F | Ht 70.0 in | Wt 248.5 lb

## 2018-06-21 DIAGNOSIS — T7840XD Allergy, unspecified, subsequent encounter: Secondary | ICD-10-CM

## 2018-06-21 DIAGNOSIS — I10 Essential (primary) hypertension: Secondary | ICD-10-CM | POA: Diagnosis not present

## 2018-06-21 NOTE — Progress Notes (Signed)
Chief Complaint  Patient presents with  . Follow-up    congestion  . Neck Pain    Subjective [name] is a 58 y.o. male who presents for hypertension follow up. He does monitor home blood pressures. Blood pressures ranging from 120-130's/80's on average. He is compliant with medications- Norvasc 10 mg/d, losartan 100 mg/d. Patient has these side effects of medication: none He is adhering to a healthy diet overall. Current exercise: walking  Nasal congestion and neck pain have improved. He is using Xyzal and Flonase that have been helpful. No longer having drainage.    Past Medical History:  Diagnosis Date  . BPH (benign prostatic hyperplasia)   . CAD (coronary artery disease)    a. NSTEMI troponin >65 with cath 11/2016 S/p PTCA & DES to mid circumflex; irregularies in LAD & RCA, LVEDP 24, EF 50% and 45-50% by echo.  . Chronic chest pain   . Chronic combined systolic and diastolic CHF (congestive heart failure) ([place])    a. EF 45% in 2018. b. EF 55-60% by echo 01/2017. c. EF 48% by nuc 07/2017.  . CKD (chronic kidney disease), stage II   . Cluster headache    hx of  . Complication of anesthesia    difficult waking up"  . Diverticulosis   . Drug abuse ([place])    hx of  . GERD (gastroesophageal reflux disease)   . Hiatal hernia   . Hyperlipidemia   . Hypertension   . Hypothyroidism   . Nontoxic multinodular goiter   . NSTEMI (non-ST elevated myocardial infarction) ([place])   . Perforated nasal septum   . Pulmonary nodule --- CT 10/19/2014: Nodule is stable, no further routine x-rays 01/06/2009  . TIA (transient ischemic attack)   . Trigeminal neuralgia   . Tubular adenoma of colon 12/2015  . Unspecified asthma(493.90)     Review of Systems Cardiovascular: no chest pain Respiratory:  no shortness of breath  Exam BP 120/88 (BP Location: Left Arm, Patient Position: Sitting, Cuff Size: Large)   Pulse 69   Temp 97.6 F (36.4 C) (Oral)   Ht 5\' 10"  (1.778 m)   Wt 248  lb 8 oz (112.7 kg)   SpO2 95%   BMI 35.66 kg/m  General:  well developed, well nourished, in no apparent distress HEENT: Turbinates neg, +septal perforation Heart: RRR, no bruits, no LE edema Lungs: clear to auscultation, no accessory muscle use Psych: well oriented with normal range of affect and appropriate judgment/insight  Essential hypertension  Allergic state, subsequent encounter  Orders as above. Counseled on diet and exercise. F/u in 2 weeks for BP ck w nurse, bring monitor and ck w ours and his monitor. If different, get new cuff. If similar, will double dose of Coreg.  The patient voiced understanding and agreement to the plan.  [place], DO 06/21/18  9:45 AM

## 2018-06-21 NOTE — Patient Instructions (Addendum)
Keep the diet clean and stay active.  You may be able to stop medicine for your allergies depending on the season.  Bring your blood pressure monitor to your next visit.   Let us know if you need anything.

## 2018-06-25 NOTE — Progress Notes (Signed)
Cardiology Office Note    Date:  06/26/2018   ID:  [name], DOB 01/10/1961, MRN [id number]  PCP:  [name], DO  Cardiologist: Ena Dawley, MD EPS: None  Chief Complaint  Patient presents with  . Hospitalization Follow-up    History of Present Illness:  [name] is a 58 y.o. male with history of CAD status post STEMI treated with DES to the circumflex OM 11/2016 with follow-up cath 1 week later showed patent stent and LVEF improved to 55 to 60%.  No ischemia on Lexiscan 07/2017.  Patient had repeat nuclear stress test for chest pain and had evidence of ischemia along the a.m. Patient underwent cardiac catheterization 06/10/2018 and had successful DES to the mid RCA and had residual 70% inferior branch of the OM 2 with patent OM 2 stent and moderate nonobstructive LAD disease.  If patient has recurrent chest pain refractory to antianginal therapy PCI to inferior branch to the OM 2 could be considered although this is a small vessel.  Aggressive medical management recommended.  Patient called in because he was having dizziness on gabapentin that was started last year by Dr. Debara Pickett for possible neuropathic pain.  Patient wants to wean off the gabapentin.  Patient comes in for f/u. Stent has made all the difference in the world for him.  No further chest pain.  Complains of insomnia.  Blood pressure has been running high at home.  Past Medical History:  Diagnosis Date  . BPH (benign prostatic hyperplasia)   . CAD (coronary artery disease)    a. NSTEMI troponin >65 with cath 11/2016 S/p PTCA & DES to mid circumflex; irregularies in LAD & RCA, LVEDP 24, EF 50% and 45-50% by echo.  . Chronic chest pain   . Chronic combined systolic and diastolic CHF (congestive heart failure) ([place])    a. EF 45% in 2018. b. EF 55-60% by echo 01/2017. c. EF 48% by nuc 07/2017.  . CKD (chronic kidney disease), stage II   . Cluster headache    hx of  . Complication of anesthesia      difficult waking up"  . Diverticulosis   . Drug abuse ([place])    hx of  . GERD (gastroesophageal reflux disease)   . Hiatal hernia   . Hyperlipidemia   . Hypertension   . Hypothyroidism   . Nontoxic multinodular goiter   . NSTEMI (non-ST elevated myocardial infarction) ([place])   . Perforated nasal septum   . Pulmonary nodule --- CT 10/19/2014: Nodule is stable, no further routine x-rays 01/06/2009  . TIA (transient ischemic attack)   . Trigeminal neuralgia   . Tubular adenoma of colon 12/2015  . Unspecified asthma(493.90)     Past Surgical History:  Procedure Laterality Date  . APPENDECTOMY    . CARDIAC CATHETERIZATION N/A 11/25/2014   Procedure: Left Heart Cath and Coronary Angiography;  Surgeon: Belva Crome, MD;  Location: [facility];  Service: Cardiovascular;  Laterality: N/A;  . CARDIAC CATHETERIZATION  12/08/2008   Archie Endo 08/24/2010  . COLECTOMY  ~ 1976   "I had a blockage"  . CORONARY STENT INTERVENTION N/A 11/27/2016   Procedure: CORONARY STENT INTERVENTION;  Surgeon: Belva Crome, MD;  Location: [facility];  Service: Cardiovascular;  Laterality: N/A;  . CORONARY STENT INTERVENTION N/A 06/10/2018   Procedure: CORONARY STENT INTERVENTION;  Surgeon: Nelva Bush, MD;  Location: [facility];  Service: Cardiovascular;  Laterality: N/A;  . CORONARY STENT PLACEMENT  11/27/2016   STENT XIENCE ALPINE RX 3.0X15 drug eluting stent was successfully placed  . CYST EXCISION Left 10/31/2016   Jaw  . DESTRUCTION TRIGEMINAL NERVE VIA NEUROLYTIC AGENT  x 3 , R side last ~2010  . KNEE ARTHROSCOPY Right 06/26/2000   Arthroscopy followed by open lateral release/notes 09/06/2010  . LEFT HEART CATH AND CORONARY ANGIOGRAPHY N/A 11/27/2016   Procedure: LEFT HEART CATH AND CORONARY ANGIOGRAPHY;  Surgeon: Belva Crome, MD;  Location: [facility];  Service: Cardiovascular;  Laterality: N/A;  . LEFT HEART CATH AND CORONARY ANGIOGRAPHY N/A 12/07/2016   Procedure: LEFT  HEART CATH AND CORONARY ANGIOGRAPHY;  Surgeon: Nelva Bush, MD;  Location: [facility];  Service: Cardiovascular;  Laterality: N/A;  . LEFT HEART CATH AND CORONARY ANGIOGRAPHY N/A 06/10/2018   Procedure: LEFT HEART CATH AND CORONARY ANGIOGRAPHY;  Surgeon: Nelva Bush, MD;  Location: [facility];  Service: Cardiovascular;  Laterality: N/A;  . 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 Ardine Bjork 09/06/2010  . SHOULDER HEMI-ARTHROPLASTY Right 05/26/2010   revision/notes 05/27/2010    Current Medications: Current Meds  Medication Sig  . amLODipine (NORVASC) 10 MG tablet Take 1 tablet (10 mg total) by mouth daily.  Marland Kitchen aspirin EC 81 MG tablet Take 81 mg by mouth daily.  . clopidogrel (PLAVIX) 75 MG tablet Take 75 mg by mouth daily.  Marland Kitchen ezetimibe (ZETIA) 10 MG tablet Take 1 tablet (10 mg total) by mouth daily.  Marland Kitchen FLUoxetine (PROZAC) 20 MG tablet Take 1 tablet (20 mg total) by mouth daily. Take 1/2 tab daily for the first 2 weeks.  . fluticasone (FLONASE) 50 MCG/ACT nasal spray Place 2 sprays into both nostrils daily.  Marland Kitchen gabapentin (NEURONTIN) 300 MG capsule Take 1 capsule (300 mg total) by mouth as directed.  . lansoprazole (PREVACID) 30 MG capsule Take 1 capsule (30 mg total) by mouth 2 (two) times daily before a meal.  . levocetirizine (XYZAL) 5 MG tablet Take 1 tablet (5 mg total) by mouth every evening.  Marland Kitchen losartan (COZAAR) 100 MG tablet TAKE 1 TABLET (100 MG)  BY MOUTH DAILY  . nitroGLYCERIN (NITROSTAT) 0.4 MG SL tablet Place 1 tablet (0.4 mg total) under the tongue every 5 (five) minutes as needed for chest pain.  Vladimir Faster Glycol-Propyl Glycol (SYSTANE OP) Place 1 drop into both eyes every 3 (three) hours as needed (dry eyes).   . promethazine (PHENERGAN) 12.5 MG tablet Take 12.5 mg by mouth every 6 (six) hours as needed for nausea or vomiting.  . ranolazine (RANEXA) 500 MG 12 hr tablet Take 1 tablet (500 mg total) by mouth 2 (two) times daily.  .  rosuvastatin (CRESTOR) 40 MG tablet Take 1 tablet (40 mg total) by mouth at bedtime.  . [DISCONTINUED] carvedilol (COREG) 12.5 MG tablet Take 1 tablet (12.5 mg total) by mouth 2 (two) times daily with a meal.  . [DISCONTINUED] gabapentin (NEURONTIN) 300 MG capsule Take 1 capsule (300 mg total) by mouth 2 (two) times daily.     Allergies:   Tramadol   Social History   Socioeconomic History  . Marital status: Married    Spouse name: Not on file  . Number of children: 2  . Years of education: Not on file  . Highest education level: Not on file  Occupational History  . Occupation: Merchandiser, retail [employer]  . Financial resource strain: Not on file  . Food insecurity:    Worry: Not  on file    Inability: Not on file  . Transportation needs:    Medical: Not on file    Non-medical: Not on file  Tobacco Use  . Smoking status: Former Smoker    Packs/day: 0.25    Years: 12.00    Pack years: 3.00    Types: Cigarettes    Last attempt to quit: 11/15/2016    Years since quitting: 1.6  . Smokeless tobacco: Never Used  Substance and Sexual Activity  . Alcohol use: Yes    Comment: occ  . Drug use: No  . Sexual activity: Not on file  Lifestyle  . Physical activity:    Days per week: Not on file    Minutes per session: Not on file  . Stress: Not on file  Relationships  . Social connections:    Talks on phone: Not on file    Gets together: Not on file    Attends religious service: Not on file    Active member of club or organization: Not on file    Attends meetings of clubs or organizations: Not on file    Relationship status: Not on file  Other Topics Concern  . Not on file  Social History Narrative   HSG   C[employer]inary school in [place]   Married - '84 - 5 years divorced; remarried '96   1 son - '85               Family History:  The patient's family history includes CAD in his father; Cancer in his father; Diabetes in his mother; Heart disease in his father  and maternal aunt; Heart failure in his father; Hyperlipidemia in his mother; Hypertension in his father and mother; Mesothelioma in his father; Sudden death in his father.   ROS:   Please see the history of present illness.    Review of Systems  Constitution: Negative.  HENT: Negative.   Cardiovascular: Negative.   Respiratory: Negative.   Endocrine: Negative.   Hematologic/Lymphatic: Negative.   Musculoskeletal: Negative.   Gastrointestinal: Negative.   Genitourinary: Negative.   Neurological: Negative.   Psychiatric/Behavioral: The patient has insomnia.    All other systems reviewed and are negative.   PHYSICAL EXAM:   VS:  BP (!) 130/92   Pulse 80   Ht 5\' 10"  (1.778 m)   Wt 245 lb 12.8 oz (111.5 kg)   SpO2 91%   BMI 35.27 kg/m   Physical Exam  GEN: Well nourished, well developed, in no acute distress  Neck: no JVD, carotid bruits, or masses Cardiac:RRR; no murmurs, rubs, or gallops  Respiratory:  clear to auscultation bilaterally, normal work of breathing GI: soft, nontender, nondistended, + BS Ext: without cyanosis, clubbing, or edema, Good distal pulses bilaterally Neuro:  Alert and Oriented x 3 Psych: euthymic mood, full affect  Wt Readings from Last 3 Encounters:  06/26/18 245 lb 12.8 oz (111.5 kg)  06/21/18 248 lb 8 oz (112.7 kg)  06/11/18 248 lb 7.3 oz (112.7 kg)      Studies/Labs Reviewed:   EKG:  EKG is not ordered today.    Recent Labs: 07/22/2017: B Natriuretic Peptide 24.8 06/11/2018: BUN 15; Creatinine, Ser 1.37; Hemoglobin 14.4; Platelets 113; Potassium 3.9; Sodium 136   Lipid Panel    Component Value Date/Time   CHOL 292 (H) 06/08/2018 0523   CHOL 245 (H) 05/29/2017 0915   TRIG 39 06/08/2018 0523   HDL 110 06/08/2018 0523   HDL 66 05/29/2017 0915  CHOLHDL 2.7 06/08/2018 0523   VLDL 8 06/08/2018 0523   LDLCALC 174 (H) 06/08/2018 0523   LDLCALC 160 (H) 05/29/2017 0915   LDLDIRECT 209.8 07/20/2009 0951    Additional studies/ records that  were reviewed today include:    TTE: 06/10/2018   IMPRESSIONS    1. The left ventricle has normal systolic function with an ejection fraction of 60-65%. The cavity size was normal. There is mildly increased left ventricular wall thickness. Left ventricular diastolic Doppler parameters are consistent with impaired  relaxation Indeterminent filling pressures The E/e' is 8-15.  2. The right ventricle has normal systolic function. The cavity was normal. There is no increase in right ventricular wall thickness.  3. The mitral valve is normal in structure.  4. The tricuspid valve is normal in structure.  5. The aortic valve is normal in structure.  6. The aortic root and ascending aorta are normal in size and structure.  7. The interatrial septum was not well visualized.  8. When compared to the prior study: Compared to a prior study in 01/2017, there are no new changes.     1. Significant two-vessel coronary artery disease involving mid RCA (75%) and inferior branch of OM2 (70%). 2. Moderate, non-obstructive LAD disease. 3. Widely patent OM2 stent. 4. Normal left ventricular filling pressure. 5. Successful PCI to mid RCA using Resolute Onyx 3.5 x 15 mm DES with 0% residual stenosis and TIMI-3 flow.   Recommendations: 1. Continue dual antiplatelet therapy with aspirin and clopidogrel for at least 12 months, ideally longer. 2. Wean nitroglycerin infusion as tolerated.  If patient has recurrent chest pain refractory to antianginal therapy, PCI to inferior branch of OM2 could be considered (this is a small vessel). 3. Aggressive secondary prevention. 4. Follow-up echo for evaluation of left ventricular systolic function.     ASSESSMENT:    1. Coronary artery disease due to lipid rich plaque   2. Chronic heart failure with preserved ejection fraction (HFpEF) ([place])   3. Essential hypertension   4. Dyslipidemia      PLAN:  In order of problems listed above:  CAD status post STEMI  treated with DES to the circumflex OM 11/2016.  Abnormal nuclear stress test with DES to the mid RCA 06/10/2018 with residual 70% inferior branch to the OM 2 and patent OM 2 stent with moderate nonobstructive CAD to the LAD.  Consideration for PCI to the inferior OM 2 branch if refractory chest pain.  Patient feeling so much better without recurrent chest pain since his stent.  Wants to wean off gabapentin which was given to him by Dr. Debara Pickett for possible neuropathic pain.  Have discussed wean with pharmacist who recommend 300 mg once a day for 2 weeks then stop.  Chronic heart failure with preserved ejection fraction no heart failure on exam  Essential hypertension blood pressure running high.  Will increase carvedilol to 25 mg twice daily  Dyslipidemia on Zetia LDL 174 06/08/2018.  Will repeat lipid panel and LFTs at next office visit.    Medication Adjustments/Labs and Tests Ordered: Current medicines are reviewed at length with the patient today.  Concerns regarding medicines are outlined above.  Medication changes, Labs and Tests ordered today are listed in the Patient Instructions below. Patient Instructions  Medication Instructions:  1.) INCREASE: Carvedilol to 25 mg twice a day   2.) Take Gabapentin 300 mg once a day for 2 weeks and then STOP  If you need a refill on your cardiac medications  before your next appointment, please call your pharmacy.   Lab work: None  If you have labs (blood work) drawn today and your tests are completely normal, you will receive your results only by: Marland Kitchen MyChart Message (if you have MyChart) OR . A paper copy in the mail If you have any lab test that is abnormal or we need to change your treatment, we will call you to review the results.  Testing/Procedures:   Follow-Up: Follow up with Dr. Meda Coffee at her 1st available appointment   Any Other Special Instructions Will Be Listed Below (If Applicable).       Sumner Boast, PA-C  06/26/2018  10:41 AM    [facility] [address], [place], [place]  [address] Phone: [phone]; Fax: [phone]

## 2018-06-26 ENCOUNTER — Ambulatory Visit: Payer: 59 | Admitting: Physician Assistant

## 2018-06-26 ENCOUNTER — Encounter: Payer: Self-pay | Admitting: Physician Assistant

## 2018-06-26 VITALS — BP 130/92 | HR 80 | Ht 70.0 in | Wt 245.8 lb

## 2018-06-26 DIAGNOSIS — E785 Hyperlipidemia, unspecified: Secondary | ICD-10-CM | POA: Diagnosis not present

## 2018-06-26 DIAGNOSIS — I2510 Atherosclerotic heart disease of native coronary artery without angina pectoris: Secondary | ICD-10-CM

## 2018-06-26 DIAGNOSIS — I10 Essential (primary) hypertension: Secondary | ICD-10-CM | POA: Diagnosis not present

## 2018-06-26 DIAGNOSIS — I2583 Coronary atherosclerosis due to lipid rich plaque: Secondary | ICD-10-CM

## 2018-06-26 DIAGNOSIS — I5032 Chronic diastolic (congestive) heart failure: Secondary | ICD-10-CM | POA: Diagnosis not present

## 2018-06-26 DIAGNOSIS — R002 Palpitations: Secondary | ICD-10-CM | POA: Diagnosis not present

## 2018-06-26 MED ORDER — GABAPENTIN 300 MG PO CAPS: 300.0000 mg | ORAL_CAPSULE | ORAL | 0 refills | Status: DC

## 2018-06-26 MED ORDER — CARVEDILOL 25 MG PO TABS: 25.0000 mg | ORAL_TABLET | Freq: Two times a day (BID) | ORAL | 3 refills | Status: DC

## 2018-06-26 NOTE — Patient Instructions (Addendum)
Medication Instructions:  1.) INCREASE: Carvedilol to 25 mg twice a day   2.) Take Gabapentin 300 mg once a day for 2 weeks and then STOP  If you need a refill on your cardiac medications before your next appointment, please call your pharmacy.   Lab work: None  If you have labs (blood work) drawn today and your tests are completely normal, you will receive your results only by: Marland Kitchen MyChart Message (if you have MyChart) OR . A paper copy in the mail If you have any lab test that is abnormal or we need to change your treatment, we will call you to review the results.  Testing/Procedures:   Follow-Up: Follow up with Dr. Meda Coffee at her 1st available appointment   Any Other Special Instructions Will Be Listed Below (If Applicable).

## 2018-06-27 ENCOUNTER — Ambulatory Visit: Payer: Self-pay | Admitting: Family Medicine

## 2018-07-04 ENCOUNTER — Other Ambulatory Visit: Payer: Self-pay | Admitting: Cardiology

## 2018-07-05 ENCOUNTER — Ambulatory Visit: Payer: Self-pay

## 2018-07-09 ENCOUNTER — Ambulatory Visit: Payer: Self-pay

## 2018-07-15 ENCOUNTER — Other Ambulatory Visit: Payer: Self-pay

## 2018-07-15 DIAGNOSIS — I10 Essential (primary) hypertension: Secondary | ICD-10-CM

## 2018-07-15 MED ORDER — LOSARTAN POTASSIUM 100 MG PO TABS: ORAL_TABLET | ORAL | 3 refills | Status: DC

## 2018-07-23 ENCOUNTER — Encounter (HOSPITAL_BASED_OUTPATIENT_CLINIC_OR_DEPARTMENT_OTHER): Payer: Self-pay | Admitting: Emergency Medicine

## 2018-07-23 ENCOUNTER — Other Ambulatory Visit: Payer: Self-pay

## 2018-07-23 ENCOUNTER — Observation Stay (HOSPITAL_BASED_OUTPATIENT_CLINIC_OR_DEPARTMENT_OTHER)
Admission: EM | Admit: 2018-07-23 | Discharge: 2018-07-25 | Disposition: A | Discharge status: Home / Self Care | Payer: 59 | Attending: Internal Medicine | Admitting: Internal Medicine

## 2018-07-23 ENCOUNTER — Emergency Department (HOSPITAL_BASED_OUTPATIENT_CLINIC_OR_DEPARTMENT_OTHER): Payer: 59

## 2018-07-23 ENCOUNTER — Ambulatory Visit: Payer: Self-pay | Admitting: Family Medicine

## 2018-07-23 DIAGNOSIS — E7800 Pure hypercholesterolemia, unspecified: Secondary | ICD-10-CM | POA: Diagnosis not present

## 2018-07-23 DIAGNOSIS — E872 Acidosis, unspecified: Secondary | ICD-10-CM | POA: Diagnosis present

## 2018-07-23 DIAGNOSIS — R0789 Other chest pain: Secondary | ICD-10-CM | POA: Diagnosis present

## 2018-07-23 DIAGNOSIS — R112 Nausea with vomiting, unspecified: Secondary | ICD-10-CM | POA: Diagnosis present

## 2018-07-23 DIAGNOSIS — Z9861 Coronary angioplasty status: Secondary | ICD-10-CM

## 2018-07-23 DIAGNOSIS — I10 Essential (primary) hypertension: Secondary | ICD-10-CM

## 2018-07-23 DIAGNOSIS — K219 Gastro-esophageal reflux disease without esophagitis: Secondary | ICD-10-CM | POA: Diagnosis not present

## 2018-07-23 DIAGNOSIS — Z7982 Long term (current) use of aspirin: Secondary | ICD-10-CM | POA: Diagnosis not present

## 2018-07-23 DIAGNOSIS — I5042 Chronic combined systolic (congestive) and diastolic (congestive) heart failure: Secondary | ICD-10-CM | POA: Insufficient documentation

## 2018-07-23 DIAGNOSIS — I200 Unstable angina: Secondary | ICD-10-CM | POA: Diagnosis present

## 2018-07-23 DIAGNOSIS — J45909 Unspecified asthma, uncomplicated: Secondary | ICD-10-CM | POA: Diagnosis not present

## 2018-07-23 DIAGNOSIS — Z8673 Personal history of transient ischemic attack (TIA), and cerebral infarction without residual deficits: Secondary | ICD-10-CM | POA: Insufficient documentation

## 2018-07-23 DIAGNOSIS — N182 Chronic kidney disease, stage 2 (mild): Secondary | ICD-10-CM | POA: Diagnosis not present

## 2018-07-23 DIAGNOSIS — Z7902 Long term (current) use of antithrombotics/antiplatelets: Secondary | ICD-10-CM | POA: Insufficient documentation

## 2018-07-23 DIAGNOSIS — Z955 Presence of coronary angioplasty implant and graft: Secondary | ICD-10-CM | POA: Diagnosis not present

## 2018-07-23 DIAGNOSIS — Z9049 Acquired absence of other specified parts of digestive tract: Secondary | ICD-10-CM | POA: Insufficient documentation

## 2018-07-23 DIAGNOSIS — I130 Hypertensive heart and chronic kidney disease with heart failure and stage 1 through stage 4 chronic kidney disease, or unspecified chronic kidney disease: Secondary | ICD-10-CM | POA: Diagnosis not present

## 2018-07-23 DIAGNOSIS — Z8249 Family history of ischemic heart disease and other diseases of the circulatory system: Secondary | ICD-10-CM | POA: Diagnosis not present

## 2018-07-23 DIAGNOSIS — I25119 Atherosclerotic heart disease of native coronary artery with unspecified angina pectoris: Secondary | ICD-10-CM | POA: Diagnosis not present

## 2018-07-23 DIAGNOSIS — I252 Old myocardial infarction: Secondary | ICD-10-CM | POA: Diagnosis not present

## 2018-07-23 DIAGNOSIS — Z79899 Other long term (current) drug therapy: Secondary | ICD-10-CM | POA: Diagnosis not present

## 2018-07-23 DIAGNOSIS — Z87891 Personal history of nicotine dependence: Secondary | ICD-10-CM | POA: Diagnosis not present

## 2018-07-23 DIAGNOSIS — Z885 Allergy status to narcotic agent status: Secondary | ICD-10-CM | POA: Insufficient documentation

## 2018-07-23 DIAGNOSIS — R079 Chest pain, unspecified: Secondary | ICD-10-CM

## 2018-07-23 DIAGNOSIS — I5032 Chronic diastolic (congestive) heart failure: Secondary | ICD-10-CM | POA: Diagnosis not present

## 2018-07-23 DIAGNOSIS — G500 Trigeminal neuralgia: Secondary | ICD-10-CM | POA: Diagnosis not present

## 2018-07-23 DIAGNOSIS — Z7951 Long term (current) use of inhaled steroids: Secondary | ICD-10-CM | POA: Diagnosis not present

## 2018-07-23 DIAGNOSIS — E039 Hypothyroidism, unspecified: Secondary | ICD-10-CM | POA: Insufficient documentation

## 2018-07-23 DIAGNOSIS — I2510 Atherosclerotic heart disease of native coronary artery without angina pectoris: Secondary | ICD-10-CM | POA: Diagnosis present

## 2018-07-23 LAB — BASIC METABOLIC PANEL
Anion gap: >20 — ABNORMAL HIGH (ref 5–15)
Anion gap: 26 — ABNORMAL HIGH (ref 5–15)
BUN: 14 mg/dL (ref 6–20)
BUN: 15 mg/dL (ref 6–20)
CO2: 10 mmol/L — ABNORMAL LOW (ref 22–32)
CO2: 10 mmol/L — ABNORMAL LOW (ref 22–32)
Calcium: 9.1 mg/dL (ref 8.9–10.3)
Calcium: 8.9 mg/dL (ref 8.9–10.3)
Chloride: 96 mmol/L — ABNORMAL LOW (ref 98–111)
Chloride: 98 mmol/L (ref 98–111)
Creatinine, Ser: 1.22 mg/dL (ref 0.61–1.24)
Creatinine, Ser: 1.15 mg/dL (ref 0.61–1.24)
GFR calc Af Amer: >60 mL/min (ref >60)
GFR calc Af Amer: >60 mL/min (ref >60)
GFR calc non Af Amer: >60 mL/min (ref >60)
GFR calc non Af Amer: >60 mL/min (ref >60)
Glucose, Bld: 101 mg/dL — ABNORMAL HIGH (ref 70–99)
Glucose, Bld: 103 mg/dL — ABNORMAL HIGH (ref 70–99)
Potassium: 4.7 mmol/L (ref 3.5–5.1)
Potassium: 5.1 mmol/L (ref 3.5–5.1)
Sodium: 133 mmol/L — ABNORMAL LOW (ref 135–145)
Sodium: 134 mmol/L — ABNORMAL LOW (ref 135–145)

## 2018-07-23 LAB — SALICYLATE LEVEL: Salicylate Lvl: <7 mg/dL (ref 2.8–30.0)

## 2018-07-23 LAB — POCT I-STAT EG7
Acid-base deficit: 13 mmol/L — ABNORMAL HIGH (ref 0.0–2.0)
Bicarbonate: 10.6 mmol/L — ABNORMAL LOW (ref 20.0–28.0)
Calcium, Ion: 1.07 mmol/L — ABNORMAL LOW (ref 1.15–1.40)
HCT: 47 % (ref 39.0–52.0)
Hemoglobin: 16 g/dL (ref 13.0–17.0)
O2 Saturation: 94 %
Potassium: 5 mmol/L (ref 3.5–5.1)
Sodium: 133 mmol/L — ABNORMAL LOW (ref 135–145)
TCO2: 11 mmol/L — ABNORMAL LOW (ref 22–32)
pCO2, Ven: 20.1 mmHg — ABNORMAL LOW (ref 44.0–60.0)
pH, Ven: 7.33 (ref 7.250–7.430)
pO2, Ven: 71 mmHg — ABNORMAL HIGH (ref 32.0–45.0)

## 2018-07-23 LAB — URINALYSIS, ROUTINE W REFLEX MICROSCOPIC
Glucose, UA: NEGATIVE mg/dL
Ketones, ur: >80 mg/dL — AB
Leukocytes,Ua: NEGATIVE
Nitrite: NEGATIVE
Protein, ur: 100 mg/dL — AB
Specific Gravity, Urine: >1.03 — ABNORMAL HIGH (ref 1.005–1.030)
pH: 6 (ref 5.0–8.0)

## 2018-07-23 LAB — URINALYSIS, MICROSCOPIC (REFLEX)

## 2018-07-23 LAB — RAPID URINE DRUG SCREEN, HOSP PERFORMED
Amphetamines: NOT DETECTED
Barbiturates: NOT DETECTED
Benzodiazepines: NOT DETECTED
Cocaine: NOT DETECTED
Opiates: NOT DETECTED
Tetrahydrocannabinol: NOT DETECTED

## 2018-07-23 LAB — LACTIC ACID, PLASMA
Lactic Acid, Venous: 3.8 mmol/L (ref 0.5–1.9)
Lactic Acid, Venous: 4.4 mmol/L (ref 0.5–1.9)
Lactic Acid, Venous: 4 mmol/L (ref 0.5–1.9)
Lactic Acid, Venous: 2.6 mmol/L (ref 0.5–1.9)
Lactic Acid, Venous: 1.4 mmol/L (ref 0.5–1.9)

## 2018-07-23 LAB — CBC
HCT: 48.4 % (ref 39.0–52.0)
Hemoglobin: 15.8 g/dL (ref 13.0–17.0)
MCH: 32.2 pg (ref 26.0–34.0)
MCHC: 32.6 g/dL (ref 30.0–36.0)
MCV: 98.6 fL (ref 80.0–100.0)
Platelets: 157 10*3/uL (ref 150–400)
RBC: 4.91 MIL/uL (ref 4.22–5.81)
RDW: 14.3 % (ref 11.5–15.5)
WBC: 7.6 10*3/uL (ref 4.0–10.5)
nRBC: 0 % (ref 0.0–0.2)

## 2018-07-23 LAB — I-STAT TROPONIN, ED
Troponin i, poc: 0.01 ng/mL (ref 0.00–0.08)
Troponin i, poc: 0 ng/mL (ref 0.00–0.08)

## 2018-07-23 LAB — TROPONIN I
Troponin I: <0.03 ng/mL (ref <0.03)
Troponin I: <0.03 ng/mL (ref <0.03)

## 2018-07-23 LAB — MAGNESIUM: Magnesium: 2 mg/dL (ref 1.7–2.4)

## 2018-07-23 MED ORDER — AMLODIPINE BESYLATE 10 MG PO TABS
10.0000 mg | ORAL_TABLET | Freq: Every day | ORAL | Status: DC
  Administered 2018-07-24 – 2018-07-25 (×2): 10 mg via ORAL
  Filled 2018-07-23 (×2): qty 1

## 2018-07-23 MED ORDER — CLOPIDOGREL BISULFATE 75 MG PO TABS
75.0000 mg | ORAL_TABLET | Freq: Every day | ORAL | Status: DC
  Administered 2018-07-23 – 2018-07-25 (×2): 75 mg via ORAL
  Filled 2018-07-23 (×3): qty 1

## 2018-07-23 MED ORDER — LACTATED RINGERS IV BOLUS
1000.0000 mL | Freq: Once | INTRAVENOUS | Status: AC
  Administered 2018-07-23: 1000 mL via INTRAVENOUS

## 2018-07-23 MED ORDER — NITROGLYCERIN IN D5W 200-5 MCG/ML-% IV SOLN
0.0000 ug/min | INTRAVENOUS | Status: DC
  Administered 2018-07-23: 35 ug/min via INTRAVENOUS
  Administered 2018-07-23: 5 ug/min via INTRAVENOUS
  Filled 2018-07-23: qty 250

## 2018-07-23 MED ORDER — PROMETHAZINE HCL 25 MG/ML IJ SOLN
INTRAMUSCULAR | Status: AC
  Filled 2018-07-23: qty 1

## 2018-07-23 MED ORDER — FENTANYL CITRATE (PF) 100 MCG/2ML IJ SOLN
100.0000 ug | INTRAMUSCULAR | Status: DC | PRN
  Administered 2018-07-23 – 2018-07-25 (×5): 100 ug via INTRAVENOUS
  Filled 2018-07-23 (×5): qty 2

## 2018-07-23 MED ORDER — ACETAMINOPHEN 325 MG PO TABS: 650.0000 mg | ORAL_TABLET | Freq: Four times a day (QID) | ORAL | Status: DC | PRN

## 2018-07-23 MED ORDER — LOSARTAN POTASSIUM 50 MG PO TABS
100.0000 mg | ORAL_TABLET | Freq: Every day | ORAL | Status: DC
  Administered 2018-07-24 – 2018-07-25 (×2): 100 mg via ORAL
  Filled 2018-07-23 (×2): qty 2

## 2018-07-23 MED ORDER — SODIUM CHLORIDE 0.9 % IV BOLUS
1000.0000 mL | Freq: Once | INTRAVENOUS | Status: AC
  Administered 2018-07-23: 1000 mL via INTRAVENOUS

## 2018-07-23 MED ORDER — NITROGLYCERIN 0.4 MG SL SUBL
0.4000 mg | SUBLINGUAL_TABLET | SUBLINGUAL | Status: DC | PRN
  Administered 2018-07-23: 0.4 mg via SUBLINGUAL
  Filled 2018-07-23: qty 1

## 2018-07-23 MED ORDER — PROCHLORPERAZINE EDISYLATE 10 MG/2ML IJ SOLN
5.0000 mg | Freq: Once | INTRAMUSCULAR | Status: AC
  Administered 2018-07-23: 5 mg via INTRAVENOUS
  Filled 2018-07-23: qty 1

## 2018-07-23 MED ORDER — ASPIRIN 81 MG PO CHEW
324.0000 mg | CHEWABLE_TABLET | Freq: Once | ORAL | Status: AC
  Administered 2018-07-23: 324 mg via ORAL
  Filled 2018-07-23: qty 4

## 2018-07-23 MED ORDER — PANTOPRAZOLE SODIUM 40 MG IV SOLR
40.0000 mg | Freq: Two times a day (BID) | INTRAVENOUS | Status: DC
  Administered 2018-07-23 – 2018-07-24 (×4): 40 mg via INTRAVENOUS
  Filled 2018-07-23 (×4): qty 40

## 2018-07-23 MED ORDER — NITROGLYCERIN IN D5W 200-5 MCG/ML-% IV SOLN
INTRAVENOUS | Status: AC
  Filled 2018-07-23: qty 250

## 2018-07-23 MED ORDER — PROCHLORPERAZINE EDISYLATE 10 MG/2ML IJ SOLN
10.0000 mg | Freq: Four times a day (QID) | INTRAMUSCULAR | Status: DC | PRN
  Administered 2018-07-23 – 2018-07-24 (×3): 10 mg via INTRAVENOUS
  Filled 2018-07-23 (×4): qty 2

## 2018-07-23 MED ORDER — SODIUM CHLORIDE 0.45 % IV SOLN: INTRAVENOUS | Status: DC

## 2018-07-23 MED ORDER — ROSUVASTATIN CALCIUM 20 MG PO TABS
40.0000 mg | ORAL_TABLET | Freq: Every day | ORAL | Status: DC
  Administered 2018-07-24: 40 mg via ORAL
  Filled 2018-07-23: qty 2

## 2018-07-23 MED ORDER — PROMETHAZINE HCL 25 MG/ML IJ SOLN
25.0000 mg | Freq: Once | INTRAMUSCULAR | Status: AC
  Administered 2018-07-23: 25 mg via INTRAMUSCULAR

## 2018-07-23 MED ORDER — METOPROLOL TARTRATE 5 MG/5ML IV SOLN
5.0000 mg | INTRAVENOUS | Status: DC
  Administered 2018-07-23 (×2): 5 mg via INTRAVENOUS
  Filled 2018-07-23 (×2): qty 5

## 2018-07-23 MED ORDER — CARVEDILOL 25 MG PO TABS
25.0000 mg | ORAL_TABLET | Freq: Two times a day (BID) | ORAL | Status: DC
  Administered 2018-07-24 – 2018-07-25 (×3): 25 mg via ORAL
  Filled 2018-07-23 (×3): qty 1

## 2018-07-23 MED ORDER — KETOROLAC TROMETHAMINE 15 MG/ML IJ SOLN
15.0000 mg | Freq: Once | INTRAMUSCULAR | Status: AC
  Administered 2018-07-23: 15 mg via INTRAVENOUS
  Filled 2018-07-23: qty 1

## 2018-07-23 MED ORDER — FENTANYL CITRATE (PF) 100 MCG/2ML IJ SOLN
75.0000 ug | Freq: Once | INTRAMUSCULAR | Status: AC
  Administered 2018-07-23: 75 ug via INTRAVENOUS
  Filled 2018-07-23: qty 2

## 2018-07-23 MED ORDER — SODIUM CHLORIDE 0.9% FLUSH
3.0000 mL | Freq: Once | INTRAVENOUS | Status: DC
  Filled 2018-07-23: qty 3

## 2018-07-23 MED ORDER — ASPIRIN 81 MG PO CHEW
81.0000 mg | CHEWABLE_TABLET | Freq: Every day | ORAL | Status: DC
  Administered 2018-07-24 – 2018-07-25 (×2): 81 mg via ORAL
  Filled 2018-07-23 (×2): qty 1

## 2018-07-23 MED ORDER — ACETAMINOPHEN 650 MG RE SUPP: 650.0000 mg | Freq: Four times a day (QID) | RECTAL | Status: DC | PRN

## 2018-07-23 MED ORDER — ENOXAPARIN SODIUM 40 MG/0.4ML ~~LOC~~ SOLN: 40.0000 mg | SUBCUTANEOUS | Status: DC

## 2018-07-23 MED ORDER — EZETIMIBE 10 MG PO TABS
10.0000 mg | ORAL_TABLET | Freq: Every day | ORAL | Status: DC
  Administered 2018-07-24 – 2018-07-25 (×2): 10 mg via ORAL
  Filled 2018-07-23 (×2): qty 1

## 2018-07-23 MED ORDER — SODIUM CHLORIDE 0.9 % IV SOLN
INTRAVENOUS | Status: DC
  Administered 2018-07-23 (×2): via INTRAVENOUS

## 2018-07-23 MED ORDER — SODIUM CHLORIDE 0.9 % IV SOLN
Freq: Once | INTRAVENOUS | Status: AC
  Administered 2018-07-23: 10:00:00 via INTRAVENOUS

## 2018-07-23 NOTE — ED Notes (Signed)
Pt assisted to [facility]

## 2018-07-23 NOTE — Telephone Encounter (Signed)
Pt called in c/o having a cough that has been getting worse, very tired, vomiting, and having chest pain.   He has a strong cardiac history, (MI and stents).   See triage notes.  I have referred him to the ED.   He stated he only lives 5 minutes away from the [facility] ED on [address] so he is going to drive himself there now.  I sent these notes to Dr. Nani Ravens so he would be aware.   Reason for Disposition . [1] Chest pain lasts > 5 minutes AND [2] history of heart disease  (i.e., heart attack, bypass surgery, angina, angioplasty, CHF; not just a heart murmur)  Answer Assessment - Initial Assessment Questions 1. VOMITING SEVERITY: "How many times have you vomited in the past 24 hours?"     - MILD:  1 - 2 times/day    - MODERATE: 3 - 5 times/day, decreased oral intake without significant weight loss or symptoms of dehydration    - SEVERE: 6 or more times/day, vomits everything or nearly everything, with significant weight loss, symptoms of dehydration      I've had this cough for 2 wks and it's getting worse.   I vomited 5-6 times yesterday.  This morning clear liquid twice. 2. ONSET: "When did the vomiting begin?"      Day before yesterday.   I had an appt last Tuesday for the cough but I didn't want to be around anyone else.   I canceled that appt. 3. FLUIDS: "What fluids or food have you vomited up today?" "Have you been able to keep any fluids down?"     Clear  Fluids this morning.  I've been drinking water.   I've not been able to eat for the last 2 days.   4. ABDOMINAL PAIN: "Are your having any abdominal pain?" If yes : "How bad is it and what does it feel like?" (e.g., crampy, dull, intermittent, constant)      No   I've had some chest pain yesterday. 5. DIARRHEA: "Is there any diarrhea?" If so, ask: "How many times today?"      No 6. CONTACTS: "Is there anyone else in the family with the same symptoms?"      I live with my wife and we have not been out. 7. CAUSE:  "What do you think is causing your vomiting?"     I have no idea. 8. HYDRATION STATUS: "Any signs of dehydration?" (e.g., dry mouth [not only dry lips], too weak to stand) "When did you last urinate?"     I'm drinking fluids. 9. OTHER SYMPTOMS: "Do you have any other symptoms?" (e.g., fever, headache, vertigo, vomiting blood or coffee grounds, recent head injury)     Coughing but not coughing up anything.   I don't know about fever.   I was chilly last night.   No energy what so ever. 10. PREGNANCY: "Is there any chance you are pregnant?" "When was your last menstrual period?"       N/A  Answer Assessment - Initial Assessment Questions 1. LOCATION: "Where does it hurt?"       I've had 2 stents put in   One last month.   Hurts under my chest bone and to the center on the left side. 2. RADIATION: "Does the pain go anywhere else?" (e.g., into neck, jaw, arms, back)     No 3. ONSET: "When did the chest pain begin?" (Minutes, hours or days)  It began yesterday or day before. 4. PATTERN "Does the pain come and go, or has it been constant since it started?"  "Does it get worse with exertion?"      It comes and goes. 5. DURATION: "How long does it last" (e.g., seconds, minutes, hours)     It lasts probably 10-15 minutes.   I stopped the gabapentin the doctor told me to stop it on Friday. 6. SEVERITY: "How bad is the pain?"  (e.g., Scale 1-10; mild, moderate, or severe)    - MILD (1-3): doesn't interfere with normal activities     - MODERATE (4-7): interferes with normal activities or awakens from sleep    - SEVERE (8-10): excruciating pain, unable to do any normal activities       5 when it happens then goes away.   It does not wake me.   It'll happen when I'm sitting around. 7. CARDIAC RISK FACTORS: "Do you have any history of heart problems or risk factors for heart disease?" (e.g., prior heart attack, angina; high blood pressure, diabetes, being overweight, high cholesterol, smoking, or  strong family history of heart disease)     Had stents put in.  One stent done a month ago.    First time I had a heart attack, the next time they saw a blockage and put in a stent.   Not diabetic. 8. PULMONARY RISK FACTORS: "Do you have any history of lung disease?"  (e.g., blood clots in lung, asthma, emphysema, birth control pills)     None of the above. 9. CAUSE: "What do you think is causing the chest pain?"     When I had heart attack I didn't know I was having one.   With the 2nd stent I had pain.    I pain in my chest and under my arm.    But now just a little in my chest. 10. OTHER SYMPTOMS: "Do you have any other symptoms?" (e.g., dizziness, nausea, vomiting, sweating, fever, difficulty breathing, cough)       See vomiting algarhytm..    Dizzy when got out of the shower this morning.  I had to come lay down on the bed.   I was dizzy for 10-15 minutes.  I was sweating but I just got out of the hot shower.   I've had the chest pain and vomiting this morning.    I'm coughing but it's not dry but nothing is coming up.  Yesterday it was brown stuff vomited up.   I had not eaten anything yesterday. 11. PREGNANCY: "Is there any chance you are pregnant?" "When was your last menstrual period?"       N/A  Protocols used: CHEST PAIN-A-AH, VOMITING-A-AH

## 2018-07-23 NOTE — ED Notes (Signed)
ED Provider at bedside. 

## 2018-07-23 NOTE — Progress Notes (Signed)
Pt arrived from [place]. VSS. Pt reporting CP still level 6/10. VSS. Hospitalist notified of pt arrival. CHG complete. Telemetry applied. Pt oriented to room and unit. Will continue to monitor.  Clyde Canterbury, RN

## 2018-07-23 NOTE — ED Notes (Signed)
Report to Jarrett Soho, RN at [facility] 4E

## 2018-07-23 NOTE — Telephone Encounter (Signed)
FYI

## 2018-07-23 NOTE — ED Provider Notes (Signed)
[facility]ERGENC[facility] DEPARTMENT Provider Note   CSN: [id number] Arrival date & time: 07/23/18  0846    History   Chief Complaint Chief Complaint  Patient presents with   Chest Pain    HPI [name] is a 58 y.o. male. HPI  58 year old male presents with chest pain.  Has been having intermittent chest pain for several days, probably about 4.  Comes and goes multiple times per day, lasting about 20 minutes at a time.  He has not tried nitroglycerin.  It is not specifically exertional and usually at rest.  There is no shortness of breath with it.  He carries a chronic cough but no new cough or fever or contact with anyone with COVID-19.  The patient states that he has had some intermittent vomiting but not every time.  However this morning the chest pain was more severe around 8 out of 10 and made him very lightheaded to the point he had to lower himself to the ground.  He did vomit as well.  The chest pain is better at this time but is still 5 out of 10.  It is middle and left-sided and feels like pressure.  Feels like prior anginal equivalent.  No pleuritic pain or leg swelling.  Past Medical History:  Diagnosis Date   BPH (benign prostatic hyperplasia)    CAD (coronary artery disease)    a. NSTEMI troponin >65 with cath 11/2016 S/p PTCA & DES to mid circumflex; irregularies in LAD & RCA, LVEDP 24, EF 50% and 45-50% by echo.   Chronic chest pain    Chronic combined systolic and diastolic CHF (congestive heart failure) ([place])    a. EF 45% in 2018. b. EF 55-60% by echo 01/2017. c. EF 48% by nuc 07/2017.   CKD (chronic kidney disease), stage II    Cluster headache    hx of   Complication of anesthesia    difficult waking up"   Diverticulosis    Drug abuse ([place])    hx of   GERD (gastroesophageal reflux disease)    Hiatal hernia    Hyperlipidemia    Hypertension    Hypothyroidism    Nontoxic multinodular goiter    NSTEMI (non-ST elevated myocardial  infarction) ([place])    Perforated nasal septum    Pulmonary nodule --- CT 10/19/2014: Nodule is stable, no further routine x-rays 01/06/2009   TIA (transient ischemic attack)    Trigeminal neuralgia    Tubular adenoma of colon 12/2015   Unspecified asthma(493.90)     Patient Active Problem List   Diagnosis Date Noted   Status post coronary artery stent placement    History of coronary artery disease    Unstable angina ([place]) 06/08/2018   S/P angioplasty with stent    Chronic heart failure with preserved ejection fraction (HFpEF) ([place])    Situational depression 06/07/2018   Palpitations 05/28/2018   Chronic cough 05/27/2018   Gross hematuria 05/27/2018   Chest pain 07/22/2017   Ischemic cardiomyopathy 02/05/2017   Obesity 12/08/2016   Chronic diastolic CHF (congestive heart failure) ([place]) 12/08/2016   Atypical chest pain 12/07/2016   NSTEMI (non-ST elevated myocardial infarction) ([place]) 11/26/2016   Intractable nausea and vomiting 10/09/2016   Nausea vomiting and diarrhea 10/09/2016   Enteritis due to Clostridium difficile 10/30/2015   Malnutrition of moderate degree 10/21/2015   Acute kidney injury ([place]) 10/20/2015   PCP NOTES >>>>>>>>>>>>>>>>> 09/30/2015   Hypercholesteremia 09/22/2015   Renal mass, left 11/02/2014   CAD (  coronary artery disease)    Drug abuse and dependence ([place]) 09/18/2014   Visit for preventive health examination 02/04/2014   Screening for ischemic heart disease 02/04/2014   BPH associated with nocturia 12/24/2013   Hypothyroidism 12/24/2013   Chronic ethmoidal sinusitis 12/24/2013   Low back pain radiating to right leg 05/10/2012   Thrombocytopenia ([place]) 12/05/2011   Tobacco use 11/30/2011   Anxiety 12/26/2010   Transient cerebral ischemia 01/07/2010   Asthma 01/19/2009   Pulmonary nodule --- CT 10/19/2014: Nodule is stable, no further routine x-rays 01/06/2009   Nontoxic multinodular goiter 12/31/2008    DYSPNEA ON EXERTION 12/15/2008   Trigeminal neuralgia 07/24/2007   Dyslipidemia 12/27/2006   Cluster headache 12/27/2006   Essential hypertension 12/27/2006   GERD 12/27/2006    Past Surgical History:  Procedure Laterality Date   APPENDECTOMY     CARDIAC CATHETERIZATION N/A 11/25/2014   Procedure: Left Heart Cath and Coronary Angiography;  Surgeon: Belva Crome, MD;  Location: [facility];  Service: Cardiovascular;  Laterality: N/A;   CARDIAC CATHETERIZATION  12/08/2008   Archie Endo 08/24/2010   COLECTOMY  ~ 1976   "I had a blockage"   CORONARY STENT INTERVENTION N/A 11/27/2016   Procedure: CORONARY STENT INTERVENTION;  Surgeon: Belva Crome, MD;  Location: [facility];  Service: Cardiovascular;  Laterality: N/A;   CORONARY STENT INTERVENTION N/A 06/10/2018   Procedure: CORONARY STENT INTERVENTION;  Surgeon: Nelva Bush, MD;  Location: [facility];  Service: Cardiovascular;  Laterality: N/A;   CORONARY STENT PLACEMENT  11/27/2016   STENT XIENCE ALPINE RX 3.0X15 drug eluting stent was successfully placed   CYST EXCISION Left 10/31/2016   Jaw   DESTRUCTION TRIGEMINAL NERVE VIA NEUROLYTIC AGENT  x 3 , R side last ~2010   KNEE ARTHROSCOPY Right 06/26/2000   Arthroscopy followed by open lateral release/notes 09/06/2010   LEFT HEART CATH AND CORONARY ANGIOGRAPHY N/A 11/27/2016   Procedure: LEFT HEART CATH AND CORONARY ANGIOGRAPHY;  Surgeon: Belva Crome, MD;  Location: [facility];  Service: Cardiovascular;  Laterality: N/A;   LEFT HEART CATH AND CORONARY ANGIOGRAPHY N/A 12/07/2016   Procedure: LEFT HEART CATH AND CORONARY ANGIOGRAPHY;  Surgeon: Nelva Bush, MD;  Location: [facility];  Service: Cardiovascular;  Laterality: N/A;   LEFT HEART CATH AND CORONARY ANGIOGRAPHY N/A 06/10/2018   Procedure: LEFT HEART CATH AND CORONARY ANGIOGRAPHY;  Surgeon: Nelva Bush, MD;  Location: [facility];  Service: Cardiovascular;   Laterality: N/A;   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 Ardine Bjork 09/06/2010   SHOULDER HEMI-ARTHROPLASTY Right 05/26/2010   revision/notes 05/27/2010        Home Medications    Prior to Admission medications   Medication Sig Start Date End Date Taking? Authorizing Provider  amLODipine (NORVASC) 10 MG tablet Take 1 tablet (10 mg total) by mouth daily. 06/07/18   [name], DO  aspirin EC 81 MG tablet Take 81 mg by mouth daily.    [provider]  carvedilol (COREG) 25 MG tablet Take 1 tablet (25 mg total) by mouth 2 (two) times daily. 06/26/18   Imogene Burn, PA-C  clopidogrel (PLAVIX) 75 MG tablet TAKE ONE TABLET BY MOUTH DAILY WITH BREAKFAST 07/04/18   Imogene Burn, PA-C  ezetimibe (ZETIA) 10 MG tablet Take 1 tablet (10 mg total) by mouth daily. 05/27/18   [name], DO  FLUoxetine (PROZAC) 20 MG tablet Take 1 tablet (20 mg  total) by mouth daily. Take 1/2 tab daily for the first 2 weeks. 06/07/18   [name], DO  fluticasone (FLONASE) 50 MCG/ACT nasal spray Place 2 sprays into both nostrils daily. 05/27/18   [name], DO  gabapentin (NEURONTIN) 300 MG capsule Take 1 capsule (300 mg total) by mouth as directed. 06/26/18   Imogene Burn, PA-C  lansoprazole (PREVACID) 30 MG capsule Take 1 capsule (30 mg total) by mouth 2 (two) times daily before a meal. 05/27/18   Wendling, Crosby Oyster, DO  levocetirizine (XYZAL) 5 MG tablet Take 1 tablet (5 mg total) by mouth every evening. 05/27/18   [name], DO  losartan (COZAAR) 100 MG tablet TAKE 1 TABLET (100 MG)  BY MOUTH DAILY 07/15/18   Imogene Burn, PA-C  nitroGLYCERIN (NITROSTAT) 0.4 MG SL tablet Place 1 tablet (0.4 mg total) under the tongue every 5 (five) minutes as needed for chest pain. 05/28/18   Imogene Burn, PA-C  Polyethyl Glycol-Propyl Glycol (SYSTANE OP) Place 1 drop into both eyes every 3 (three) hours as needed (dry eyes).      [provider]  promethazine (PHENERGAN) 12.5 MG tablet Take 12.5 mg by mouth every 6 (six) hours as needed for nausea or vomiting.    [provider]  ranolazine (RANEXA) 500 MG 12 hr tablet Take 1 tablet (500 mg total) by mouth 2 (two) times daily. 06/11/18   Cheryln Manly, NP  rosuvastatin (CRESTOR) 40 MG tablet Take 1 tablet (40 mg total) by mouth at bedtime. 06/11/18   Cheryln Manly, NP    Family History Family History  Problem Relation Age of Onset   Diabetes Mother    Hyperlipidemia Mother    Hypertension Mother    Hypertension Father    Heart disease Father    Cancer Father    Sudden death Father        OCT August 09, 2009   CAD Father    Heart failure Father    Mesothelioma Father    Heart disease Maternal Aunt    Prostate cancer Neg Hx    Colon cancer Neg Hx     Social History Social History   Tobacco Use   Smoking status: Former Smoker    Packs/day: 0.25    Years: 12.00    Pack years: 3.00    Types: Cigarettes    Last attempt to quit: 11/15/2016    Years since quitting: 1.6   Smokeless tobacco: Never Used  Substance Use Topics   Alcohol use: Yes    Comment: occ   Drug use: No     Allergies   Tramadol   Review of Systems Review of Systems  Constitutional: Negative for fever.  Respiratory: Negative for cough and shortness of breath.   Cardiovascular: Positive for chest pain. Negative for leg swelling.  Gastrointestinal: Positive for vomiting. Negative for abdominal pain.  Musculoskeletal: Negative for back pain.  Neurological: Positive for light-headedness.  All other systems reviewed and are negative.    Physical Exam Updated Vital Signs BP 131/89    Pulse 99    Temp 98.3 F (36.8 C) (Oral)    Resp (!) 22    Ht 5\' 10"  (1.778 m)    Wt 108.9 kg    SpO2 95%    BMI 34.44 kg/m   Physical Exam Vitals signs and nursing note reviewed.  Constitutional:      General: He is not in acute distress.    Appearance: He  is  well-developed. He is obese. He is not ill-appearing or diaphoretic.  HENT:     Head: Normocephalic and atraumatic.     Right Ear: External ear normal.     Left Ear: External ear normal.     Nose: Nose normal.  Eyes:     General:        Right eye: No discharge.        Left eye: No discharge.  Neck:     Musculoskeletal: Neck supple.  Cardiovascular:     Rate and Rhythm: Normal rate and regular rhythm.     Heart sounds: Normal heart sounds.  Pulmonary:     Effort: Pulmonary effort is normal. No tachypnea or accessory muscle usage.  Chest:     Chest wall: No tenderness.  Abdominal:     Palpations: Abdomen is soft.     Tenderness: There is no abdominal tenderness.  Musculoskeletal:     Right lower leg: No edema.     Left lower leg: No edema.  Skin:    General: Skin is warm and dry.  Neurological:     Mental Status: He is alert.  Psychiatric:        Mood and Affect: Mood is not anxious.      ED Treatments / Results  Labs (all labs ordered are listed, but only abnormal results are displayed) Labs Reviewed  BASIC METABOLIC PANEL - Abnormal; Notable for the following components:      Result Value   Sodium 133 (*)    Chloride 96 (*)    CO2 10 (*)    Glucose, Bld 101 (*)    Anion gap >20 (*)    All other components within normal limits  POCT I-STAT EG7 - Abnormal; Notable for the following components:   pCO2, Ven 20.1 (*)    pO2, Ven 71.0 (*)    Bicarbonate 10.6 (*)    TCO2 11 (*)    Acid-base deficit 13.0 (*)    Sodium 133 (*)    Calcium, Ion 1.07 (*)    All other components within normal limits  CBC  TROPONIN I  LACTIC ACID, PLASMA  LACTIC ACID, PLASMA  BASIC METABOLIC PANEL  SALICYLATE LEVEL  URINALYSIS, ROUTINE W REFLEX MICROSCOPIC  RAPID URINE DRUG SCREEN, HOSP PERFORMED  I-STAT TROPONIN, ED  I-STAT VENOUS BLOOD GAS, ED    EKG EKG Interpretation  Date/Time:  Tuesday July 23 2018 08:57:09 EDT Ventricular Rate:  96 PR Interval:    QRS  Duration: 102 QT Interval:  352 QTC Calculation: 445 R Axis:   95 Text Interpretation:  Sinus rhythm Borderline right axis deviation Nonspecific T abnrm, anterolateral leads Anterior T wave changes new compared to Feb 2020 Confirmed by Sherwood Gambler [phone]) on 07/23/2018 9:07:59 AM   Radiology Dg Chest 2 View  Result Date: 07/23/2018 CLINICAL DATA:  Chest pain and nausea. History of coronary artery disease with prior PCI. EXAM: CHEST - 2 VIEW COMPARISON:  06/07/2018 FINDINGS: Stable heart size. Interval removal of loop recorder device. There is no evidence of pulmonary edema, consolidation, pneumothorax, nodule or pleural fluid. Visualized bony structures are unremarkable. IMPRESSION: No active cardiopulmonary disease. Electronically Signed   By: Aletta Edouard M.D.   On: 07/23/2018 09:32    Procedures .Critical Care Performed by: Sherwood Gambler, MD Authorized by: Sherwood Gambler, MD   Critical care provider statement:    Critical care time (minutes):  35   Critical care time was exclusive of:  Separately billable procedures and  treating other patients   Critical care was necessary to treat or prevent imminent or life-threatening deterioration of the following conditions:  Metabolic crisis and cardiac failure   Critical care was time spent personally by me on the following activities:  Discussions with consultants, evaluation of patient's response to treatment, examination of patient, obtaining history from patient or surrogate, ordering and performing treatments and interventions, ordering and review of laboratory studies, ordering and review of radiographic studies, pulse oximetry, re-evaluation of patient's condition and review of old charts   (including critical care time)  Medications Ordered in ED Medications  sodium chloride flush (NS) 0.9 % injection 3 mL (3 mLs Intravenous Not Given 07/23/18 0918)  nitroGLYCERIN (NITROSTAT) SL tablet 0.4 mg (0.4 mg Sublingual Given 07/23/18  0937)  nitroGLYCERIN 50 mg in dextrose 5 % 250 mL (0.2 mg/mL) infusion (20 mcg/min Intravenous Rate/Dose Change 07/23/18 1040)  nitroGLYCERIN 0.2 mg/mL in dextrose 5 % infusion (has no administration in time range)  promethazine (PHENERGAN) injection 25 mg (has no administration in time range)  aspirin chewable tablet 324 mg (324 mg Oral Given 07/23/18 0915)  0.9 %  sodium chloride infusion ( Intravenous New Bag/Given 07/23/18 0959)  lactated ringers bolus 1,000 mL (1,000 mLs Intravenous New Bag/Given 07/23/18 1107)     Initial Impression / Assessment and Plan / ED Course  I have reviewed the triage vital signs and the nursing notes.  Pertinent labs & imaging results that were available during my care of the patient were reviewed by me and considered in my medical decision making (see chart for details).     HEAR Score: 5  Patient overall appears well.  Initially, high concern for ACS.  Anterior T wave changes are new.  Initial troponin is negative but his symptoms overall started around 6 AM.  However his metabolic panel shows concern for metabolic acidosis.  This was repeated as well as a lactate and blood gas.  The blood gas confirms the acidosis with compensation with low CO2.  While he does have a little tachypnea, he is not in respiratory distress and has no respiratory complaints, including no shortness of breath and has not had a fever.  He reports a chronic allergic cough but no new cough.  He was given an IV fluid bolus of lactated Ringer's.  His lactate has come back at 3.8.  Nonspecific, unclear if this is related to vomiting.  He endorses that he has not had any diarrhea, no abdominal pain and no abdominal tenderness on repeat exams.  He states he is been drinking a lot of Sprite 0 but not as much water.  Specifically denies extra aspirin or illicit substances such as ethylene glycol.  Originally he was going to be admitted to the cardiology service but with this metabolic finding I think  to be better served on the hospital service with cardiology consulting.  He is also on a low-dose nitroglycerin drip for chest pain which is improving with nitro bolus and drip. Originally patient was discussed with Dr. Stanford Breed. Dr. Demetra Shiner of hospitalist service will admit.  His vital signs have remained stable in the emergency department.  He did vomit once but after this he has been doing well.  [name] was evaluated in Emergency Department on 07/23/2018 for the symptoms described in the history of present illness. He was evaluated in the context of the global COVID-19 pandemic, which necessitated consideration that the patient might be at risk for infection with the SARS-CoV-2 virus that causes  COVID-19. Institutional protocols and algorithms that pertain to the evaluation of patients at risk for COVID-19 are in a state of rapid change based on information released by regulatory bodies including the CDC and federal and state organizations. These policies and algorithms were followed during the patient's care in the ED.   Final Clinical Impressions(s) / ED Diagnoses   Final diagnoses:  Chest pain at rest  Metabolic acidosis    ED Discharge Orders    None       Sherwood Gambler, MD 07/23/18 [phone]

## 2018-07-23 NOTE — Progress Notes (Signed)
CRITICAL VALUE ALERT  Critical Value:  Lactic Acid 2.6  Date & Time Notied:  07/23/2018 1848  Provider Notified: Olevia Bowens, MD

## 2018-07-23 NOTE — ED Notes (Addendum)
ED Provider at bedside. Not giving any more NTG SL at this time due to bp drop.  Will start ntg drip.

## 2018-07-23 NOTE — H&P (Addendum)
History and Physical    [name] WUX:[id number] DOB: 23-Nov-1960 DOA: 07/23/2018  PCP: [name], DO   Patient coming from: Home.  I have personally briefly reviewed patient's old medical records in [place]  Chief Complaint: Chest pain.  HPI: [name] is a 58 y.o. male with medical history significant of BPH, CAD, history of NSTEMI in August of 2018, history of chronic chest pain, chronic diastolic CHF, chronic kidney disease, cluster headaches, trigeminal neuralgia, diverticulosis, history of drug abuse, GERD/hiatal hernia, hyperlipidemia, hypertension, hypothyroidism with history of nontoxic multinodular goiter, perforated nasal septum, pulmonary nodule on CT, history of TIA, tubular adenoma of colon, unspecified asthma who is sent from [facility] after presenting to the emergency department there with complaints of a week and a half of on/off chest pain, sharp, radiated to his lower and lateral chest wall, associated with mild dyspnea, palpitations and mild dizziness.  No worsening or relieving factors.  However, he states that nitroglycerin helped to a certain extent, but has given him a headache.  He denies diaphoresis, PND, orthopnea or pitting edema of the lower extremities.  He has been having multiple episodes of nausea and emesis since the weekend.  On Sunday, he had a single episode of coffee ground emesis, but states that he has had about 8 episodes of emesis daily since Monday.  He denies fever, rhinorrhea, sore throat, but complains of chronic cough.  No wheezing or hemoptysis.  Denies abdominal pain, diarrhea, constipation, melena or hematochezia.  No dysuria, frequency or hematuria.  No polyuria, polydipsia, polyphagia or blurred vision.  ED Course: Initial vital signs temperature 98.3 F, pulse 101, respirations 16, blood pressure 165/112 mmHg and O2 sat 98% on room air.  He was given 324 mg of chewable aspirin, a sublingual  nitroglycerin and then started on a nitro throw glycerin infusion.  He also received a 1000 mL LR bolus, Toradol 15 mg IVP x1 and Phenergan 25 mg IM x1 dose.  EKG showed new T wave changes.  Troponin has been negative.  CBC was normal with a white count of 7.6, hemoglobin 15.8 g/dL and platelets 157.  Lactic acid 3.8 mmol/L.  POCT I-Stat EG7 showed a pH of 7.33, PCO2 of 20.01 and PO2 of 71.0 mmol/L. Bicarbonate was 10.6 and T CO2 11 mmol/L.  Acid base deficit was 13.0.  BMP showed a sodium of 134, potassium 5.1, chloride 98 and CO2 10 mmol/L.  Anion gap was 26.  BUN was 15, creatinine 1.15 and glucose 103 mg/dL.  His chest radiograph did not show any active cardiopulmonary pathology.  Review of Systems: As per HPI otherwise 10 point review of systems negative.   Past Medical History:  Diagnosis Date  . BPH (benign prostatic hyperplasia)   . CAD (coronary artery disease)    a. NSTEMI troponin >65 with cath 11/2016 S/p PTCA & DES to mid circumflex; irregularies in LAD & RCA, LVEDP 24, EF 50% and 45-50% by echo.  . Chronic chest pain   . Chronic combined systolic and diastolic CHF (congestive heart failure) ([place])    a. EF 45% in 2018. b. EF 55-60% by echo 01/2017. c. EF 48% by nuc 07/2017.  . CKD (chronic kidney disease), stage II   . Cluster headache    hx of  . Complication of anesthesia    difficult waking up"  . Diverticulosis   . Drug abuse ([place])    hx of  . GERD (gastroesophageal reflux disease)   .  Hiatal hernia   . Hyperlipidemia   . Hypertension   . Hypothyroidism   . Nontoxic multinodular goiter   . NSTEMI (non-ST elevated myocardial infarction) ([place])   . Perforated nasal septum   . Pulmonary nodule --- CT 10/19/2014: Nodule is stable, no further routine x-rays 01/06/2009  . TIA (transient ischemic attack)   . Trigeminal neuralgia   . Tubular adenoma of colon 12/2015  . Unspecified asthma(493.90)     Past Surgical History:  Procedure Laterality Date  . APPENDECTOMY    .  CARDIAC CATHETERIZATION N/A 11/25/2014   Procedure: Left Heart Cath and Coronary Angiography;  Surgeon: Belva Crome, MD;  Location: [facility];  Service: Cardiovascular;  Laterality: N/A;  . CARDIAC CATHETERIZATION  12/08/2008   Archie Endo 08/24/2010  . COLECTOMY  ~ 1976   "I had a blockage"  . CORONARY STENT INTERVENTION N/A 11/27/2016   Procedure: CORONARY STENT INTERVENTION;  Surgeon: Belva Crome, MD;  Location: [facility];  Service: Cardiovascular;  Laterality: N/A;  . CORONARY STENT INTERVENTION N/A 06/10/2018   Procedure: CORONARY STENT INTERVENTION;  Surgeon: Nelva Bush, MD;  Location: [facility];  Service: Cardiovascular;  Laterality: N/A;  . CORONARY STENT PLACEMENT  11/27/2016   STENT XIENCE ALPINE RX 3.0X15 drug eluting stent was successfully placed  . CYST EXCISION Left 10/31/2016   Jaw  . DESTRUCTION TRIGEMINAL NERVE VIA NEUROLYTIC AGENT  x 3 , R side last ~2010  . KNEE ARTHROSCOPY Right 06/26/2000   Arthroscopy followed by open lateral release/notes 09/06/2010  . LEFT HEART CATH AND CORONARY ANGIOGRAPHY N/A 11/27/2016   Procedure: LEFT HEART CATH AND CORONARY ANGIOGRAPHY;  Surgeon: Belva Crome, MD;  Location: [facility];  Service: Cardiovascular;  Laterality: N/A;  . LEFT HEART CATH AND CORONARY ANGIOGRAPHY N/A 12/07/2016   Procedure: LEFT HEART CATH AND CORONARY ANGIOGRAPHY;  Surgeon: Nelva Bush, MD;  Location: [facility];  Service: Cardiovascular;  Laterality: N/A;  . LEFT HEART CATH AND CORONARY ANGIOGRAPHY N/A 06/10/2018   Procedure: LEFT HEART CATH AND CORONARY ANGIOGRAPHY;  Surgeon: Nelva Bush, MD;  Location: [facility];  Service: Cardiovascular;  Laterality: N/A;  . 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 Ardine Bjork 09/06/2010  . SHOULDER HEMI-ARTHROPLASTY Right 05/26/2010   revision/notes 05/27/2010     reports that he quit smoking about 20 months ago. His smoking use included cigarettes. He  has a 3.00 pack-year smoking history. He has never used smokeless tobacco. He reports current alcohol use. He reports that he does not use drugs.  Allergies  Allergen Reactions  . Tramadol Palpitations    Family History  Problem Relation Age of Onset  . Diabetes Mother   . Hyperlipidemia Mother   . Hypertension Mother   . Hypertension Father   . Heart disease Father   . Cancer Father   . Sudden death Father        OCT Jul 21, 2009  . CAD Father   . Heart failure Father   . Mesothelioma Father   . Heart disease Maternal Aunt   . Prostate cancer Neg Hx   . Colon cancer Neg Hx    Prior to Admission medications   Medication Sig Start Date End Date Taking? Authorizing Provider  amLODipine (NORVASC) 10 MG tablet Take 1 tablet (10 mg total) by mouth daily. 06/07/18   [name], DO  aspirin EC 81 MG tablet Take 81 mg by mouth daily.  [provider]  carvedilol (COREG) 25 MG tablet Take 1 tablet (25 mg total) by mouth 2 (two) times daily. 06/26/18   Imogene Burn, PA-C  clopidogrel (PLAVIX) 75 MG tablet TAKE ONE TABLET BY MOUTH DAILY WITH BREAKFAST 07/04/18   Imogene Burn, PA-C  ezetimibe (ZETIA) 10 MG tablet Take 1 tablet (10 mg total) by mouth daily. 05/27/18   [name], DO  FLUoxetine (PROZAC) 20 MG tablet Take 1 tablet (20 mg total) by mouth daily. Take 1/2 tab daily for the first 2 weeks. 06/07/18   [name], DO  fluticasone (FLONASE) 50 MCG/ACT nasal spray Place 2 sprays into both nostrils daily. 05/27/18   [name], DO  gabapentin (NEURONTIN) 300 MG capsule Take 1 capsule (300 mg total) by mouth as directed. 06/26/18   Imogene Burn, PA-C  lansoprazole (PREVACID) 30 MG capsule Take 1 capsule (30 mg total) by mouth 2 (two) times daily before a meal. 05/27/18   Wendling, Crosby Oyster, DO  levocetirizine (XYZAL) 5 MG tablet Take 1 tablet (5 mg total) by mouth every evening. 05/27/18   [name], DO  losartan  (COZAAR) 100 MG tablet TAKE 1 TABLET (100 MG)  BY MOUTH DAILY 07/15/18   Imogene Burn, PA-C  nitroGLYCERIN (NITROSTAT) 0.4 MG SL tablet Place 1 tablet (0.4 mg total) under the tongue every 5 (five) minutes as needed for chest pain. 05/28/18   Imogene Burn, PA-C  Polyethyl Glycol-Propyl Glycol (SYSTANE OP) Place 1 drop into both eyes every 3 (three) hours as needed (dry eyes).     [provider]  promethazine (PHENERGAN) 12.5 MG tablet Take 12.5 mg by mouth every 6 (six) hours as needed for nausea or vomiting.    [provider]  ranolazine (RANEXA) 500 MG 12 hr tablet Take 1 tablet (500 mg total) by mouth 2 (two) times daily. 06/11/18   Cheryln Manly, NP  rosuvastatin (CRESTOR) 40 MG tablet Take 1 tablet (40 mg total) by mouth at bedtime. 06/11/18   Cheryln Manly, NP    Physical Exam: Vitals:   07/23/18 1215 07/23/18 1230 07/23/18 1334 07/23/18 1346  BP: (!) 149/101 (!) 147/95 (!) 149/97   Pulse: 88 93 99   Resp: 20 19 (!) 22   Temp:   97.7 F (36.5 C)   TempSrc:   Oral   SpO2: 97% 95% 97%   Weight:    105.4 kg  Height:    5\' 10"  (1.778 m)    Constitutional: NAD, calm, comfortable Eyes: PERRL, lids and conjunctivae normal ENMT: Mucous membranes are dry. Posterior pharynx clear of any exudate or lesions. Neck: normal, supple, no masses, no thyromegaly Respiratory: Decreased breath sounds on bases, otherwise clear to auscultation bilaterally, no wheezing, no crackles. Normal respiratory effort. No accessory muscle use.  Cardiovascular: Regular rate and rhythm, no murmurs / rubs / gallops. No extremity edema. 2+ pedal pulses. No carotid bruits.  Abdomen: Obese, soft, no tenderness, no masses palpated. No hepatosplenomegaly. Bowel sounds positive.  Musculoskeletal: no clubbing / cyanosis. Good ROM, no contractures. Normal muscle tone.  Skin: no rashes, lesions, ulcers on limited dermatological examination. Neurologic: CN 2-12 grossly intact. Sensation  intact, DTR normal. Strength 5/5 in all 4.  Psychiatric: Normal judgment and insight. Alert and oriented x 3. Normal mood.   Labs on Admission: I have personally reviewed following labs and imaging studies  CBC: Recent Labs  Lab 07/23/18 0912 07/23/18 1047  WBC 7.6  --  HGB 15.8 16.0  HCT 48.4 47.0  MCV 98.6  --   PLT 157  --    Basic Metabolic Panel: Recent Labs  Lab 07/23/18 0937 07/23/18 1039 07/23/18 1047 07/23/18 1137  NA 133* 134* 133*  --   K 4.7 5.1 5.0  --   CL 96* 98  --   --   CO2 10* 10*  --   --   GLUCOSE 101* 103*  --   --   BUN 14 15  --   --   CREATININE 1.22 1.15  --   --   CALCIUM 9.1 8.9  --   --   MG  --   --   --  2.0   GFR: Estimated Creatinine Clearance: 88.3 mL/min (by C-G formula based on SCr of 1.15 mg/dL). Liver Function Tests: No results for input(s): AST, ALT, ALKPHOS, BILITOT, PROT, ALBUMIN in the last 168 hours. No results for input(s): LIPASE, AMYLASE in the last 168 hours. No results for input(s): AMMONIA in the last 168 hours. Coagulation Profile: No results for input(s): INR, PROTIME in the last 168 hours. Cardiac Enzymes: Recent Labs  Lab 07/23/18 0912  TROPONINI <0.03   BNP (last 3 results) No results for input(s): PROBNP in the last 8760 hours. HbA1C: No results for input(s): HGBA1C in the last 72 hours. CBG: No results for input(s): GLUCAP in the last 168 hours. Lipid Profile: No results for input(s): CHOL, HDL, LDLCALC, TRIG, CHOLHDL, LDLDIRECT in the last 72 hours. Thyroid Function Tests: No results for input(s): TSH, T4TOTAL, FREET4, T3FREE, THYROIDAB in the last 72 hours. Anemia Panel: No results for input(s): VITAMINB12, FOLATE, FERRITIN, TIBC, IRON, RETICCTPCT in the last 72 hours. Urine analysis:    Component Value Date/Time   COLORURINE YELLOW 07/23/2018 1152   APPEARANCEUR CLEAR 07/23/2018 1152   LABSPEC >1.030 (H) 07/23/2018 1152   PHURINE 6.0 07/23/2018 1152   GLUCOSEU NEGATIVE 07/23/2018 1152    GLUCOSEU NEGATIVE 05/27/2018 1421   HGBUR SMALL (A) 07/23/2018 1152   BILIRUBINUR SMALL (A) 07/23/2018 1152   BILIRUBINUR negative 02/23/2017 1539   KETONESUR >80 (A) 07/23/2018 1152   PROTEINUR 100 (A) 07/23/2018 1152   UROBILINOGEN 0.2 05/27/2018 1421   NITRITE NEGATIVE 07/23/2018 1152   LEUKOCYTESUR NEGATIVE 07/23/2018 1152    Radiological Exams on Admission: Dg Chest 2 View  Result Date: 07/23/2018 CLINICAL DATA:  Chest pain and nausea. History of coronary artery disease with prior PCI. EXAM: CHEST - 2 VIEW COMPARISON:  06/07/2018 FINDINGS: Stable heart size. Interval removal of loop recorder device. There is no evidence of pulmonary edema, consolidation, pneumothorax, nodule or pleural fluid. Visualized bony structures are unremarkable. IMPRESSION: No active cardiopulmonary disease. Electronically Signed   By: Aletta Edouard M.D.   On: 07/23/2018 09:32   Left Heart Catheterization 06/10/2018: Conclusions: 1. Significant two-vessel coronary artery disease involving mid RCA (75%) and inferior branch of OM2 (70%). 2. Moderate, non-obstructive LAD disease. 3. Widely patent OM2 stent. 4. Normal left ventricular filling pressure. 5. Successful PCI to mid RCA using Resolute Onyx 3.5 x 15 mm DES with 0% residual stenosis and TIMI-3 flow.  Recommendations: 1. Continue dual antiplatelet therapy with aspirin and clopidogrel for at least 12 months, ideally longer. 2. Wean nitroglycerin infusion as tolerated. If patient has recurrent chest pain refractory to antianginal therapy, PCI to inferior branch of OM2 could be considered (this is a small vessel). 3. Aggressive secondary prevention. 4. Follow-up echo for evaluation of left ventricular systolic function. _______________  Echocardiogram 06/10/2018: 1. The left ventricle has normal systolic function with an ejection fraction of 60-65%. The cavity size was normal. There is mildly increased left ventricular wall thickness. Left ventricular  diastolic Doppler parameters are consistent with impaired  relaxation Indeterminent filling pressures The E/e' is 8-15. 2. The right ventricle has normal systolic function. The cavity was normal. There is no increase in right ventricular wall thickness. 3. The mitral valve is normal in structure. 4. The tricuspid valve is normal in structure. 5. The aortic valve is normal in structure. 6. The aortic root and ascending aorta are normal in size and structure. 7. The interatrial septum was not well visualized. 8. When compared to the prior study: Compared to a prior study in 01/2017, there are no new changes.  Summary: LVEF 60-65%, mild LVH, normal wall motion, grade 1 DD, indeterminate LV filling pressure, normal LA size, trivial TR, normal IVC  EKG: Independently reviewed. Vent. rate 87 BPM PR interval 158 ms QRS duration 80 ms QT/QTc 354/425 ms P-R-T axes 65 83 81 Normal sinus rhythm Nonspecific T wave abnormality Abnormal ECG  Assessment/Plan Principal Problem:   Unstable angina (HCC)   CAD (coronary artery disease) Observation/progressive unit. Trend troponin level. Serial EKG. Continue nitroglycerin infusion. Fentanyl as needed for chest pain or NTG headache. IV metoprolol 5 mg every 4 hours while n.p.o.  Active Problems:   Lactic acidosis Continue IV fluids. Follow-up lactic acid level.    Intractable nausea and vomiting Continue gentle and time-limited IV hydration. Antiemetics as needed. Questionable hematemesis 2 days ago. However, no decrease in H&H. Will start Protonix 40 mg IVP every 12 hours.    Chronic diastolic CHF (congestive heart failure) (HCC) The patient is volume depleted without symptoms of CHF. Continue IV hydration. Monitor intake and output.    Essential hypertension Hold losartan and carvedilol. Metoprolol 5 mg IVP every 4 hours while n.p.o. Monitor blood pressure and heart rate.    Hypothyroidism Not on levothyroxine.     Hypercholesteremia Hold Zetia and Crestor.       DVT prophylaxis: SCDs. Code Status: Full code. Family Communication: Disposition Plan: Observation for symptoms treatment and cardiology evaluation. Consults called: Cardiology (Dr. Stanford Breed). Admission status: Progressive/observation.   Reubin Milan MD [facility]  07/23/2018, 3:01 PM   This document was prepared using Dragon voice recognition software and contains some unintended transcription errors.

## 2018-07-23 NOTE — Consult Note (Signed)
Cardiology Consult    Patient ID: Blong Busk MRN: [id number], DOB/AGE: [birth year]   Admit date: 07/23/2018 Date of Consult: 07/23/2018  Primary Physician: [name], DO Primary Cardiologist: Ena Dawley, MD Requesting Provider: Sherwood Gambler, MD  Patient Profile    [name] is a 58 y.o. male with a history of CAD s/p DES to mid circumflex/OM in 2018 and DES to RCA, chronic chest pain, chronic combined CHF with EF of 60-65 on Echo in 05/2018, TIA, hypertension, hyperlipidemia, hypothyroidism, CKD stage II, and trigeminal neuralgia, who is being seen today for the evaluation of chest pain at the request of Dr. Regenia Skeeter.  History of Present Illness    [name] is a 58 year old African-American male with the above history who is followed by Dr. Meda Coffee. Patient had an abnormal nuclear stress test on 06/05/2018 and was set up for an outpatient cardiac catheterization; however before this could be done, patient presented to the ED on 06/08/2018 with unstable angina. Patient underwent left heart catheterization on 06/10/2018 which showed significant two vessel CAD with 75% stenosis of the mid RCA and 70% stenosis of he inferior branch of OM2. Moderate, non-obstructive disease was noted in the LAD but previous stent in circumflex/OM2 was widely patent. Patient underwent successful PCI with DES to the mid RCA. Echocardiogram at that time showed LVEF of 60 to 65% with mild LVH, grade 1 diastolic dysfunction, and indeterminate LV filling pressures. Patient was discharged on Aspirin 81mg  daily, Plavix 75mg  daily, Crestor 40mg  daily, Zetia 10mg  daily, Coreg 25mg  twice daily, Ranexa 500mg  twice daily, Amlodipine 10mg  daily, and Losartan 100mg  daily.   Patient states he was doing well since being discharged until about 1 week ago he developed intermittent chest pain. Patient reports intermittent substernal/left sided chest pain over the last week that he describes as a dull sharp  pain. Pain has radiated some to his left side. Pain occurs both at rest and with exertion but is worse with exertion and can last as long as 30 minutes at a time. Patient feels like pain has been getting worse over the last week. This morning, patient had an episode of 9/10 chest pain after getting out of the shower with associated diaphoresis, lightheadedness, and nausea and vomited. Pain is not related to meals or position. Patient states this pain is similar to his prior cardiac pain. Patient also reports more dyspnea with exertion recently which he has not had since before his first stent was placed in 2018. Patient reports having some shortness of breath at night but denies any significant orthopnea, PND, or edema. He occasionally feels like his heart is racing. He denies any syncopal episodes. Patient denies any recent fevers, chills, or body aches but states he has had about 10 episodes of vomiting since yesterday. Patient feels like his vomiting and chest pain are 2 separate problems. He has chronic cough due to allergies but denies any recent travel or exposure to COVID-19. Patient decided to come to the ED for further evaluation after his episode of severe chest pain this morning.  In the ED, patient hypertensive with BP as high as 165/112 and mildly tachycardic. EKG showed normal sinus rhythm with new T wave inversions in anterior leads. I-stat troponin negative. Chest x-ray showed no acute findings. CBC unremarkable. Na 133, K 4.7, Glucose 101, SCr 1.22. Metabolic panel also concerning for metabolic acidosis with CO2 of 10. Lactic acid elevated at 4.4 improved to 3.8 after IV bolus of LR.  Currently,  patient states he is still having 5/10 chest pain.  Patient is former smoker but states he quit 2 years ago. He reports occasional alcohol use but denies any recreational drug use. He has a family history of heart disease with his mom dying from a heart attack in her late 48's.   Past Medical History    Past Medical History:  Diagnosis Date  . BPH (benign prostatic hyperplasia)   . CAD (coronary artery disease)    a. NSTEMI troponin >65 with cath 11/2016 S/p PTCA & DES to mid circumflex; irregularies in LAD & RCA, LVEDP 24, EF 50% and 45-50% by echo.  . Chronic chest pain   . Chronic combined systolic and diastolic CHF (congestive heart failure) ([place])    a. EF 45% in 2018. b. EF 55-60% by echo 01/2017. c. EF 48% by nuc 07/2017.  . CKD (chronic kidney disease), stage II   . Cluster headache    hx of  . Complication of anesthesia    difficult waking up"  . Diverticulosis   . Drug abuse ([place])    hx of  . GERD (gastroesophageal reflux disease)   . Hiatal hernia   . Hyperlipidemia   . Hypertension   . Hypothyroidism   . Nontoxic multinodular goiter   . NSTEMI (non-ST elevated myocardial infarction) ([place])   . Perforated nasal septum   . Pulmonary nodule --- CT 10/19/2014: Nodule is stable, no further routine x-rays 01/06/2009  . TIA (transient ischemic attack)   . Trigeminal neuralgia   . Tubular adenoma of colon 12/2015  . Unspecified asthma(493.90)     Past Surgical History:  Procedure Laterality Date  . APPENDECTOMY    . CARDIAC CATHETERIZATION N/A 11/25/2014   Procedure: Left Heart Cath and Coronary Angiography;  Surgeon: Belva Crome, MD;  Location: [facility];  Service: Cardiovascular;  Laterality: N/A;  . CARDIAC CATHETERIZATION  12/08/2008   Archie Endo 08/24/2010  . COLECTOMY  ~ 1976   "I had a blockage"  . CORONARY STENT INTERVENTION N/A 11/27/2016   Procedure: CORONARY STENT INTERVENTION;  Surgeon: Belva Crome, MD;  Location: [facility];  Service: Cardiovascular;  Laterality: N/A;  . CORONARY STENT INTERVENTION N/A 06/10/2018   Procedure: CORONARY STENT INTERVENTION;  Surgeon: Nelva Bush, MD;  Location: [facility];  Service: Cardiovascular;  Laterality: N/A;  . CORONARY STENT PLACEMENT  11/27/2016   STENT XIENCE ALPINE RX 3.0X15 drug eluting  stent was successfully placed  . CYST EXCISION Left 10/31/2016   Jaw  . DESTRUCTION TRIGEMINAL NERVE VIA NEUROLYTIC AGENT  x 3 , R side last ~2010  . KNEE ARTHROSCOPY Right 06/26/2000   Arthroscopy followed by open lateral release/notes 09/06/2010  . LEFT HEART CATH AND CORONARY ANGIOGRAPHY N/A 11/27/2016   Procedure: LEFT HEART CATH AND CORONARY ANGIOGRAPHY;  Surgeon: Belva Crome, MD;  Location: [facility];  Service: Cardiovascular;  Laterality: N/A;  . LEFT HEART CATH AND CORONARY ANGIOGRAPHY N/A 12/07/2016   Procedure: LEFT HEART CATH AND CORONARY ANGIOGRAPHY;  Surgeon: Nelva Bush, MD;  Location: [facility];  Service: Cardiovascular;  Laterality: N/A;  . LEFT HEART CATH AND CORONARY ANGIOGRAPHY N/A 06/10/2018   Procedure: LEFT HEART CATH AND CORONARY ANGIOGRAPHY;  Surgeon: Nelva Bush, MD;  Location: [facility];  Service: Cardiovascular;  Laterality: N/A;  . 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 Ardine Bjork 09/06/2010  . SHOULDER HEMI-ARTHROPLASTY Right 05/26/2010   revision/notes 05/27/2010  Allergies  Allergies  Allergen Reactions  . Tramadol Palpitations    Inpatient Medications    . enoxaparin (LOVENOX) injection  40 mg Subcutaneous Q24H  . sodium chloride flush  3 mL Intravenous Once    Family History    Family History  Problem Relation Age of Onset  . Diabetes Mother   . Hyperlipidemia Mother   . Hypertension Mother   . Hypertension Father   . Heart disease Father   . Cancer Father   . Sudden death Father        OCT 07/17/09  . CAD Father   . Heart failure Father   . Mesothelioma Father   . Heart disease Maternal Aunt   . Prostate cancer Neg Hx   . Colon cancer Neg Hx    He indicated that his mother is deceased. He indicated that his father is deceased. He indicated that his maternal grandmother is deceased. He indicated that his maternal grandfather is deceased. He indicated that his paternal grandmother is  deceased. He indicated that his paternal grandfather is deceased. He indicated that the status of his maternal aunt is unknown. He indicated that the status of his neg hx is unknown.   Social History    Social History   Socioeconomic History  . Marital status: Married    Spouse name: Not on file  . Number of children: 2  . Years of education: Not on file  . Highest education level: Not on file  Occupational History  . Occupation: Merchandiser, retail [employer]  . Financial resource strain: Not on file  . Food insecurity:    Worry: Not on file    Inability: Not on file  . Transportation needs:    Medical: Not on file    Non-medical: Not on file  Tobacco Use  . Smoking status: Former Smoker    Packs/day: 0.25    Years: 12.00    Pack years: 3.00    Types: Cigarettes    Last attempt to quit: 11/15/2016    Years since quitting: 1.6  . Smokeless tobacco: Never Used  Substance and Sexual Activity  . Alcohol use: Yes    Comment: occ  . Drug use: No  . Sexual activity: Not on file  Lifestyle  . Physical activity:    Days per week: Not on file    Minutes per session: Not on file  . Stress: Not on file  Relationships  . Social connections:    Talks on phone: Not on file    Gets together: Not on file    Attends religious service: Not on file    Active member of club or organization: Not on file    Attends meetings of clubs or organizations: Not on file    Relationship status: Not on file  . Intimate partner violence:    Fear of current or ex partner: Not on file    Emotionally abused: Not on file    Physically abused: Not on file    Forced sexual activity: Not on file  Other Topics Concern  . Not on file  Social History Narrative   [employer]   Culinary school in [place]   Married - 07-18-1982 - 5 years divorced; remarried Jul 18, 1994   1 son - 18-Jul-1983               Review of Systems    Review of Systems  Constitutional: Positive for diaphoresis. Negative for chills and  fever.  HENT:  Negative for congestion and sore throat.   Respiratory: Positive for cough and shortness of breath. Negative for hemoptysis.   Cardiovascular: Positive for chest pain and palpitations. Negative for orthopnea, leg swelling and PND.  Gastrointestinal: Positive for abdominal pain and vomiting. Negative for blood in stool.  Genitourinary: Negative for hematuria.  Musculoskeletal: Negative for falls, joint pain and myalgias.  Neurological: Positive for dizziness. Negative for loss of consciousness.  Endo/Heme/Allergies: Positive for environmental allergies.  Psychiatric/Behavioral: Negative for memory loss and substance abuse (former tobacco use). The patient is not nervous/anxious and does not have insomnia.   All other systems reviewed and are negative.   Physical Exam   Physical Exam per Dr. Ellyn Hack:  Blood pressure (!) 149/97, pulse 99, temperature 97.7 F (36.5 C), temperature source Oral, resp. rate (!) 22, height 5\' 10"  (1.778 m), weight 108.9 kg, SpO2 97 %.  General: 58 y.o. male resting comfortably in no acute distress.  Healthy-appearing.  pleasant and cooperative. HEENT: Normal  Neck: Supple. No carotid bruits or JVD appreciated. Lungs: No increased work of breathing. Clear to auscultation bilaterally. No wheezes, rhonchi, or rales. Heart: RRR. Distinct S1 and S2. No murmurs, gallops, or rubs.  Abdomen: Soft, non-distended, and non-tender to palpation. Bowel sounds present in all 4 quadrants.   Extremities: No clubbing, cyanosis or edema. Radial, posterior tibial, and distal pedal pulses 2+ and equal bilaterally. Skin: Warm and dry. Neuro: Alert and oriented x3. No focal deficits. Moves all extremities spontaneously. Psych: Normal affect.  Labs    Troponin [facility] Test) Recent Labs    07/23/18 1143  TROPIPOC 0.00   Recent Labs    07/23/18 0912  TROPONINI <0.03   Lab Results  Component Value Date   WBC 7.6 07/23/2018   HGB 16.0 07/23/2018   HCT  47.0 07/23/2018   MCV 98.6 07/23/2018   PLT 157 07/23/2018    Recent Labs  Lab 07/23/18 1039 07/23/18 1047  NA 134* 133*  K 5.1 5.0  CL 98  --   CO2 10*  --   BUN 15  --   CREATININE 1.15  --   CALCIUM 8.9  --   GLUCOSE 103*  --    Lab Results  Component Value Date   CHOL 292 (H) 06/08/2018   HDL 110 06/08/2018   LDLCALC 174 (H) 06/08/2018   TRIG 39 06/08/2018   Lab Results  Component Value Date   DDIMER 0.60 (H) 07/23/2017     Radiology Studies    Dg Chest 2 View  Result Date: 07/23/2018 CLINICAL DATA:  Chest pain and nausea. History of coronary artery disease with prior PCI. EXAM: CHEST - 2 VIEW COMPARISON:  06/07/2018 FINDINGS: Stable heart size. Interval removal of loop recorder device. There is no evidence of pulmonary edema, consolidation, pneumothorax, nodule or pleural fluid. Visualized bony structures are unremarkable. IMPRESSION: No active cardiopulmonary disease. Electronically Signed   By: Aletta Edouard M.D.   On: 07/23/2018 09:32    EKG     EKG: EKG was personally reviewed and demonstrates: Normal sinus rhythm with new T wave inversions in anterior leads.  Telemetry: Telemetry was personally reviewed and demonstrates: Sinus rhythm  Cardiac Imaging    Left Heart Catheterization 06/10/2018: Conclusions: 1. Significant two-vessel coronary artery disease involving mid RCA (75%) and inferior branch of OM2 (70%). 2. Moderate, non-obstructive LAD disease. 3. Widely patent OM2 stent. 4. Normal left ventricular filling pressure. 5. Successful PCI to mid RCA using Resolute Onyx 3.5 x 15  mm DES with 0% residual stenosis and TIMI-3 flow.  Recommendations: 1. Continue dual antiplatelet therapy with aspirin and clopidogrel for at least 12 months, ideally longer. 2. Wean nitroglycerin infusion as tolerated. If patient has recurrent chest pain refractory to antianginal therapy, PCI to inferior branch of OM2 could be considered (this is a small vessel). 3.  Aggressive secondary prevention. 4. Follow-up echo for evaluation of left ventricular systolic function. _______________  Echocardiogram 06/10/2018: 1. The left ventricle has normal systolic function with an ejection fraction of 60-65%. The cavity size was normal. There is mildly increased left ventricular wall thickness. Left ventricular diastolic Doppler parameters are consistent with impaired  relaxation Indeterminent filling pressures The E/e' is 8-15.  2. The right ventricle has normal systolic function. The cavity was normal. There is no increase in right ventricular wall thickness.  3. The mitral valve is normal in structure.  4. The tricuspid valve is normal in structure.  5. The aortic valve is normal in structure.  6. The aortic root and ascending aorta are normal in size and structure.  7. The interatrial septum was not well visualized.  8. When compared to the prior study: Compared to a prior study in 01/2017, there are no new changes.  Summary: LVEF 60-65%, mild LVH, normal wall motion, grade 1 DD, indeterminate LV filling pressure, normal LA size, trivial TR, normal IVC  Assessment & Plan    Chest Pain with Known CAD-status post recent PCI:  Patient presented with intermittent worsening chest pain over the past week both at rest and with exertion but worse with exertion as well as recent dyspnea on exertion. Patient recently had PCI with DES to RCA in 05/2018.   EKG now has T wave inversion in anterior leads.- Initial troponin negative. Continue to trend.   Patient continues to have intermittent 5/10 chest pain at this time - regardless of NTG gtt.   Currently on Nitroglycerin drip --however he is having nausea with this, and we may need to stop. - Patient is current NPO due to intractable vomiting and possible hematemesis. Given recent stenting, he is at an increased risk of in-stent restenosis with Aspirin and Plavix held.   Regardless of whether he is n.p.o. overnight,  he needs to take his Plavix.  Can otherwise hold other medications including aspirin, beta-blocker and statin when able to take PO.  - Patient presents with chest pain with some typical and atypical features but states it is similar to his prior cardiac pain. However, patient also reports 10 episodes of vomiting yesterday so there may be a GI component to this. May  need to titrate medical therapy and increase Ranexa to 1000mg  twice daily. Will discuss with MD about whether repeat cardiac catheterization is needed pending GI work-up   Would like to see GI issues somewhat stabilized, especially ensuring lactic acidosis is improved prior to considering relook catheterization. -->  Reassess in the morning.  Will need to ensure stable renal function and improved lactate levels.  Chronic Combined CHF -  LVEF of 60-65% on recent Echo in 05/2018. - Will need to monitor volume status closely with IV fluids.  - Monitor daily weights, strict I/O's, and renal function.  Hypertension - Most recent BP 149/97. - Current on IV Metoprolol 5mg  every 4 hours while NPO. - Restart home medications as able: Amlodipine 10mg  daily, Coreg 25mg  twice daily, Losartan 100mg  daily.  Hyperlipidemia - On Crestor 40mg  daily and Zetia 10mg  daily at home. Currently held due to NPO.  Restart as able.  Metabolic Acidosis - Metabolic panel and venous blood gas showed concerning for metabolic acidosis with low CO2. - Lactic acid elevated at 4.4 in ED and improved slightly to 3.8 after bolus of LR. - Management per primary team.  Otherwise, per primary team  Signed, Darreld Mclean, PA-C 07/23/2018, 1:45 PM  For questions or updates, please contact   Please consult [email or web address] for contact info under Cardiology/STEMI.  ATTENDING ATTESTATION  I have seen, examined and evaluated the patient this PM along with phone interview by Sande Rives, PA-C.  After reviewing all the available data and chart, we discussed the  patients laboratory, study & physical findings as well as symptoms in detail. I agree with her findings, examination as well as impression recommendations as per our discussion.    Attending adjustments noted in italics.   Very confusing situation with symptoms concerning for unstable angina, but also protracted nausea vomiting with metabolic acidosis.  We need to see the metabolic acidosis improve and ensure that his renal function stable prior to considering cardiac catheterization.  He is on nitroglycerin infusion which is not always helping his chest pain which makes me wonder if this is truly cardiac or not.  However with recent PCI, he will need a heart catheterization.  Will reassess in the morning.  He does need to be on Plavix even if all the medicines are held.    Glenetta Hew, M.D., M.S. Interventional Cardiologist   Pager # [phone] Phone # [phone] [address] [place], [place] [address]

## 2018-07-23 NOTE — ED Notes (Signed)
Patient transported to X-ray 

## 2018-07-23 NOTE — ED Triage Notes (Signed)
Intermittent episodes of CP while at rest x2 days.  Left sided - sharp and pressure.  Had stents placed 1 month ago. Dr. Meda Coffee is his cardiologist.

## 2018-07-23 NOTE — Progress Notes (Addendum)
CRITICAL VALUE ALERT  Critical Value:  Lactic Acid 4.0  Date & Time Notied:  07/23/2018 1606  Provider Notified: Olevia Bowens, MD  Orders Received/Actions taken: bolus ordered. See MAR.

## 2018-07-24 ENCOUNTER — Encounter (HOSPITAL_COMMUNITY)
Admission: EM | Disposition: A | Discharge status: Home / Self Care | Payer: Self-pay | Source: Home / Self Care | Attending: Emergency Medicine

## 2018-07-24 DIAGNOSIS — Z955 Presence of coronary angioplasty implant and graft: Secondary | ICD-10-CM | POA: Diagnosis not present

## 2018-07-24 DIAGNOSIS — I25110 Atherosclerotic heart disease of native coronary artery with unstable angina pectoris: Secondary | ICD-10-CM

## 2018-07-24 DIAGNOSIS — I25119 Atherosclerotic heart disease of native coronary artery with unspecified angina pectoris: Secondary | ICD-10-CM | POA: Diagnosis not present

## 2018-07-24 DIAGNOSIS — I130 Hypertensive heart and chronic kidney disease with heart failure and stage 1 through stage 4 chronic kidney disease, or unspecified chronic kidney disease: Secondary | ICD-10-CM | POA: Diagnosis not present

## 2018-07-24 DIAGNOSIS — I2510 Atherosclerotic heart disease of native coronary artery without angina pectoris: Secondary | ICD-10-CM | POA: Diagnosis not present

## 2018-07-24 DIAGNOSIS — I5042 Chronic combined systolic (congestive) and diastolic (congestive) heart failure: Secondary | ICD-10-CM | POA: Diagnosis not present

## 2018-07-24 DIAGNOSIS — I200 Unstable angina: Secondary | ICD-10-CM | POA: Diagnosis not present

## 2018-07-24 HISTORY — PX: LEFT HEART CATH AND CORONARY ANGIOGRAPHY: CATH118249

## 2018-07-24 LAB — CBC WITH DIFFERENTIAL/PLATELET
Abs Immature Granulocytes: 0.03 10*3/uL (ref 0.00–0.07)
Basophils Absolute: 0 10*3/uL (ref 0.0–0.1)
Basophils Relative: 1 %
Eosinophils Absolute: 0.1 10*3/uL (ref 0.0–0.5)
Eosinophils Relative: 1 %
HCT: 36.2 % — ABNORMAL LOW (ref 39.0–52.0)
Hemoglobin: 12.3 g/dL — ABNORMAL LOW (ref 13.0–17.0)
Immature Granulocytes: 1 %
Lymphocytes Relative: 40 %
Lymphs Abs: 2.4 10*3/uL (ref 0.7–4.0)
MCH: 32.3 pg (ref 26.0–34.0)
MCHC: 34 g/dL (ref 30.0–36.0)
MCV: 95 fL (ref 80.0–100.0)
Monocytes Absolute: 0.7 10*3/uL (ref 0.1–1.0)
Monocytes Relative: 12 %
Neutro Abs: 2.8 10*3/uL (ref 1.7–7.7)
Neutrophils Relative %: 45 %
Platelets: 117 10*3/uL — ABNORMAL LOW (ref 150–400)
RBC: 3.81 MIL/uL — ABNORMAL LOW (ref 4.22–5.81)
RDW: 14 % (ref 11.5–15.5)
WBC: 6 10*3/uL (ref 4.0–10.5)
nRBC: 0 % (ref 0.0–0.2)

## 2018-07-24 LAB — COMPREHENSIVE METABOLIC PANEL
ALT: 61 U/L — ABNORMAL HIGH (ref 0–44)
AST: 54 U/L — ABNORMAL HIGH (ref 15–41)
Albumin: 3.6 g/dL (ref 3.5–5.0)
Alkaline Phosphatase: 42 U/L (ref 38–126)
Anion gap: 16 — ABNORMAL HIGH (ref 5–15)
BUN: 11 mg/dL (ref 6–20)
CO2: 16 mmol/L — ABNORMAL LOW (ref 22–32)
Calcium: 8.7 mg/dL — ABNORMAL LOW (ref 8.9–10.3)
Chloride: 102 mmol/L (ref 98–111)
Creatinine, Ser: 1.24 mg/dL (ref 0.61–1.24)
GFR calc Af Amer: >60 mL/min (ref >60)
GFR calc non Af Amer: >60 mL/min (ref >60)
Glucose, Bld: 109 mg/dL — ABNORMAL HIGH (ref 70–99)
Potassium: 3.5 mmol/L (ref 3.5–5.1)
Sodium: 134 mmol/L — ABNORMAL LOW (ref 135–145)
Total Bilirubin: 2 mg/dL — ABNORMAL HIGH (ref 0.3–1.2)
Total Protein: 6.9 g/dL (ref 6.5–8.1)

## 2018-07-24 LAB — TROPONIN I
Troponin I: 0.03 ng/mL (ref <0.03)
Troponin I: 0.03 ng/mL (ref <0.03)
Troponin I: 0.03 ng/mL (ref <0.03)

## 2018-07-24 SURGERY — LEFT HEART CATH AND CORONARY ANGIOGRAPHY
Anesthesia: LOCAL

## 2018-07-24 MED ORDER — SODIUM CHLORIDE 0.9 % IV SOLN
INTRAVENOUS | Status: DC
  Administered 2018-07-24: 01:00:00 via INTRAVENOUS

## 2018-07-24 MED ORDER — MORPHINE SULFATE (PF) 2 MG/ML IV SOLN: 1.0000 mg | INTRAVENOUS | Status: DC | PRN

## 2018-07-24 MED ORDER — LIDOCAINE HCL (PF) 1 % IJ SOLN
INTRAMUSCULAR | Status: DC | PRN
  Administered 2018-07-24: 2 mL

## 2018-07-24 MED ORDER — HYDRALAZINE HCL 20 MG/ML IJ SOLN: 10.0000 mg | INTRAMUSCULAR | Status: AC | PRN

## 2018-07-24 MED ORDER — LABETALOL HCL 5 MG/ML IV SOLN: 10.0000 mg | INTRAVENOUS | Status: AC | PRN

## 2018-07-24 MED ORDER — ACETAMINOPHEN 325 MG PO TABS: 650.0000 mg | ORAL_TABLET | ORAL | Status: DC | PRN

## 2018-07-24 MED ORDER — IOHEXOL 350 MG/ML SOLN
INTRAVENOUS | Status: DC | PRN
  Administered 2018-07-24: 75 mL via INTRA_ARTERIAL

## 2018-07-24 MED ORDER — SODIUM CHLORIDE 0.9% FLUSH
3.0000 mL | Freq: Two times a day (BID) | INTRAVENOUS | Status: DC
  Administered 2018-07-24: 3 mL via INTRAVENOUS

## 2018-07-24 MED ORDER — FENTANYL CITRATE (PF) 100 MCG/2ML IJ SOLN
INTRAMUSCULAR | Status: DC | PRN
  Administered 2018-07-24: 25 ug via INTRAVENOUS

## 2018-07-24 MED ORDER — VERAPAMIL HCL 2.5 MG/ML IV SOLN
INTRAVENOUS | Status: AC
  Filled 2018-07-24: qty 2

## 2018-07-24 MED ORDER — SODIUM CHLORIDE 0.9 % IV SOLN: 250.0000 mL | INTRAVENOUS | Status: DC | PRN

## 2018-07-24 MED ORDER — SODIUM CHLORIDE 0.9% FLUSH
3.0000 mL | Freq: Two times a day (BID) | INTRAVENOUS | Status: DC
  Administered 2018-07-25: 3 mL via INTRAVENOUS

## 2018-07-24 MED ORDER — HEPARIN (PORCINE) IN NACL 1000-0.9 UT/500ML-% IV SOLN
INTRAVENOUS | Status: AC
  Filled 2018-07-24: qty 1500

## 2018-07-24 MED ORDER — SODIUM CHLORIDE 0.9 % IV SOLN
INTRAVENOUS | Status: DC
  Administered 2018-07-24: 07:00:00 via INTRAVENOUS

## 2018-07-24 MED ORDER — HEPARIN SODIUM (PORCINE) 1000 UNIT/ML IJ SOLN
INTRAMUSCULAR | Status: DC | PRN
  Administered 2018-07-24: 5000 [IU] via INTRAVENOUS

## 2018-07-24 MED ORDER — SODIUM CHLORIDE 0.9 % IV SOLN
INTRAVENOUS | Status: AC
  Administered 2018-07-24: 16:00:00 via INTRAVENOUS

## 2018-07-24 MED ORDER — VERAPAMIL HCL 2.5 MG/ML IV SOLN
INTRAVENOUS | Status: DC | PRN
  Administered 2018-07-24: 10 mL via INTRA_ARTERIAL

## 2018-07-24 MED ORDER — CLOPIDOGREL BISULFATE 75 MG PO TABS
75.0000 mg | ORAL_TABLET | ORAL | Status: AC
  Administered 2018-07-24: 75 mg via ORAL

## 2018-07-24 MED ORDER — SODIUM CHLORIDE 0.9% FLUSH: 3.0000 mL | INTRAVENOUS | Status: DC | PRN

## 2018-07-24 MED ORDER — MIDAZOLAM HCL 2 MG/2ML IJ SOLN
INTRAMUSCULAR | Status: DC | PRN
  Administered 2018-07-24: 1 mg via INTRAVENOUS

## 2018-07-24 MED ORDER — HEPARIN (PORCINE) IN NACL 1000-0.9 UT/500ML-% IV SOLN
INTRAVENOUS | Status: DC | PRN
  Administered 2018-07-24 (×3): 500 mL

## 2018-07-24 MED ORDER — MIDAZOLAM HCL 2 MG/2ML IJ SOLN
INTRAMUSCULAR | Status: AC
  Filled 2018-07-24: qty 2

## 2018-07-24 MED ORDER — ONDANSETRON HCL 4 MG/2ML IJ SOLN
4.0000 mg | Freq: Four times a day (QID) | INTRAMUSCULAR | Status: DC | PRN
  Administered 2018-07-25: 4 mg via INTRAVENOUS
  Filled 2018-07-24: qty 2

## 2018-07-24 MED ORDER — HEPARIN SODIUM (PORCINE) 1000 UNIT/ML IJ SOLN
INTRAMUSCULAR | Status: AC
  Filled 2018-07-24: qty 1

## 2018-07-24 MED ORDER — HEPARIN SODIUM (PORCINE) 5000 UNIT/ML IJ SOLN
5000.0000 [IU] | Freq: Three times a day (TID) | INTRAMUSCULAR | Status: DC
  Filled 2018-07-24: qty 1

## 2018-07-24 MED ORDER — FENTANYL CITRATE (PF) 100 MCG/2ML IJ SOLN
INTRAMUSCULAR | Status: AC
  Filled 2018-07-24: qty 2

## 2018-07-24 MED ORDER — LIDOCAINE HCL (PF) 1 % IJ SOLN
INTRAMUSCULAR | Status: AC
  Filled 2018-07-24: qty 30

## 2018-07-24 SURGICAL SUPPLY — 9 items
CATH 5FR JL3.5 JR4 ANG PIG MP (CATHETERS) ×2 IMPLANT
DEVICE RAD COMP TR BAND LRG (VASCULAR PRODUCTS) ×2 IMPLANT
GLIDESHEATH SLEND A-KIT 6F 22G (SHEATH) ×2 IMPLANT
GUIDEWIRE INQWIRE 1.5J.035X260 (WIRE) ×1 IMPLANT
INQWIRE 1.5J .035X260CM (WIRE) ×2
KIT HEART LEFT (KITS) ×2 IMPLANT
PACK CARDIAC CATHETERIZATION (CUSTOM PROCEDURE TRAY) ×2 IMPLANT
TRANSDUCER W/STOPCOCK (MISCELLANEOUS) ×2 IMPLANT
TUBING CIL FLEX 10 FLL-RA (TUBING) ×2 IMPLANT

## 2018-07-24 NOTE — H&P (View-Only) (Signed)
Progress Note  Patient Name: [name] Date of Encounter: 07/24/2018  Primary Cardiologist: Ena Dawley, MD   Subjective   Nausea and vomiting has improved.  Still has a headache from nitroglycerin.  Still having intermittent chest pain but better than prior to arrival.  Less dyspneic.  Inpatient Medications    Scheduled Meds: . amLODipine  10 mg Oral Daily  . aspirin  81 mg Oral Daily  . carvedilol  25 mg Oral BID WC  . clopidogrel  75 mg Oral Daily  . ezetimibe  10 mg Oral Daily  . losartan  100 mg Oral Daily  . pantoprazole (PROTONIX) IV  40 mg Intravenous Q12H  . rosuvastatin  40 mg Oral q1800  . sodium chloride flush  3 mL Intravenous Once  . sodium chloride flush  3 mL Intravenous Q12H   Continuous Infusions: . sodium chloride    . sodium chloride 75 mL/hr at 07/24/18 0631  . sodium chloride 75 mL/hr at 07/24/18 0122  . nitroGLYCERIN 15 mcg/min (07/24/18 0650)   PRN Meds: sodium chloride, fentaNYL (SUBLIMAZE) injection, nitroGLYCERIN, prochlorperazine, sodium chloride flush   Vital Signs    Vitals:   07/23/18 2337 07/24/18 0328 07/24/18 0531 07/24/18 0553  BP: 130/85 130/80  (!) 140/96  Pulse: 91 83  81  Resp: 16 16  15   Temp: (!) 97.3 F (36.3 C) (!) 97.4 F (36.3 C)  98.2 F (36.8 C)  TempSrc: Oral Oral  Oral  SpO2: 93% 98%  97%  Weight:   105.9 kg   Height:        Intake/Output Summary (Last 24 hours) at 07/24/2018 0755 Last data filed at 07/23/2018 2145 Gross per 24 hour  Intake 2428.52 ml  Output -  Net 3557.52 ml   Last 3 Weights 07/24/2018 07/23/2018 07/23/2018  Weight (lbs) 233 lb 8 oz 232 lb 5.8 oz 240 lb  Weight (kg) 105.915 kg 105.4 kg 108.863 kg      Telemetry    Sinus rhythm mostly in the 60s with a brief episode of sinus tachycardia while going to the bathroom this morning- Personally Reviewed  ECG    Sinus rhythm, rate 76.  New anterior T wave inversions.- Personally Reviewed  Physical Exam   GEN:  Resting comfortably  in bed.  No acute distress.   Neck: No JVD or bruit Cardiac: RRR, normal S1 and S2 (distant) no murmurs, rubs, or gallops.  Respiratory: Clear to auscultation bilaterally.  Nonlabored, good air movement GI: Soft, nontender, non-distended.  No HSM MS: No edema; No deformity. Neuro:  Nonfocal.  CN II-XII grossly intact. Psych: Normal affect   Labs    Chemistry Recent Labs  Lab 07/23/18 3220 07/23/18 1039 07/23/18 1047 07/24/18 0607  NA 133* 134* 133* 134*  K 4.7 5.1 5.0 3.5  CL 96* 98  --  102  CO2 10* 10*  --  16*  GLUCOSE 101* 103*  --  109*  BUN 14 15  --  11  CREATININE 1.22 1.15  --  1.24  CALCIUM 9.1 8.9  --  8.7*  PROT  --   --   --  6.9  ALBUMIN  --   --   --  3.6  AST  --   --   --  54*  ALT  --   --   --  61*  ALKPHOS  --   --   --  42  BILITOT  --   --   --  2.0*  GFRNONAA >60 >60  --  >60  GFRAA >60 >60  --  >60  ANIONGAP >20* 26*  --  16*     Hematology Recent Labs  Lab 07/23/18 0912 07/23/18 1047 07/24/18 0607  WBC 7.6  --  6.0  RBC 4.91  --  3.81*  HGB 15.8 16.0 12.3*  HCT 48.4 47.0 36.2*  MCV 98.6  --  95.0  MCH 32.2  --  32.3  MCHC 32.6  --  34.0  RDW 14.3  --  14.0  PLT 157  --  117*    Cardiac Enzymes Recent Labs  Lab 07/23/18 0912 07/23/18 1803 07/24/18 0003 07/24/18 0607  TROPONINI <0.03 <0.03 0.03* 0.03*    Recent Labs  Lab 07/23/18 0930 07/23/18 1143  TROPIPOC 0.01 0.00     BNPNo results for input(s): BNP, PROBNP in the last 168 hours.   DDimer No results for input(s): DDIMER in the last 168 hours.   Radiology    Dg Chest 2 View  Result Date: 07/23/2018 CLINICAL DATA:  Chest pain and nausea. History of coronary artery disease with prior PCI. EXAM: CHEST - 2 VIEW COMPARISON:  06/07/2018 FINDINGS: Stable heart size. Interval removal of loop recorder device. There is no evidence of pulmonary edema, consolidation, pneumothorax, nodule or pleural fluid. Visualized bony structures are unremarkable. IMPRESSION: No active  cardiopulmonary disease. Electronically Signed   By: Aletta Edouard M.D.   On: 07/23/2018 09:32    Cardiac Studies   Cath pending today  Patient Profile     58 y.o. 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   Assessment & Plan    1. Chest Pain with Known CAD-status post recent PCI: Patient presented with intermittent worsening chest pain over the past week both at rest and with exertion but worse with exertion as well as recent dyspnea on exertion. Patient recently had PCI with DES to RCA in 05/2018.  -- EKG this morning with more pronounced T wave inversion in anterior leads.Troponin 0.03 x2. -- remains on Nitroglycerin drip -- Patient is current NPO, will plan for cardiac cath today. MD to discuss plan and risk/benefit of cath. -- continue on ASA, plavix, BB, ARB and statin -> cannot be off of Plavix.  If other medications need to be held that would be okay, but would like to least continue Plavix.  2. Chronic Combined CHF: LVEF of 60-65% on recent Echo in 05/2018.  Euvolemic on exam. - Will need to monitor volume status closely with IV fluids. Net + 2.4L, no output recorded -was likely significantly dehydrated upon arrival.  Seems euvolemic now.  3. Hypertension - Blood pressures have improved. Now back on PO meds. - Amlodipine 10mg  daily, Coreg 25mg  twice daily, Losartan 100mg  daily.  4. Hyperlipidemia - On Crestor 40mg  daily and Zetia 10mg  daily  5. Metabolic Acidosis - Metabolic panel and venous blood gas showed concerning for metabolic acidosis with low CO2 on admission. - Lactic acid elevated at 4.4 in ED, now trending down 1.4.       Signed, Reino Bellis, NP  07/24/2018, 7:55 AM    ATTENDING ATTESTATION  I have seen, examined and evaluated  the patient this AM.  After reviewing all the available data and chart, we discussed the patients  laboratory, study & physical findings as well as symptoms in detail. I agree with the findings, examination as well as impression recommendations as per our discussion.  Exam and history performed by me.  Attending adjustments noted in italics.   Presented with 2 different constellation of symptoms including intractable nausea vomiting which seems to be improving with hydration along with atypical chest tightness pressure symptoms consistent with prior angina. Metabolic acidosis is resolved with hydration.  Renal function stable. Plan for cardiac cath today to evaluate stent patency.  Needs to be on Plavix even if other medications have to be held because of nausea.    Glenetta Hew, M.D., M.S. Interventional Cardiologist   Pager # [phone] Phone # [phone] [address] [place], [place] [address]    For questions or updates, please contact [place] Please consult [email or web address] for contact info under

## 2018-07-24 NOTE — Interval H&P Note (Signed)
History and Physical Interval Note:  07/24/2018 Cath Lab Visit (complete for each Cath Lab visit)  Clinical Evaluation Leading to the Procedure:   ACS: Yes.    Non-ACS:    Anginal Classification: CCS [name]  Anti-ischemic medical therapy: Minimal Therapy (1 class of medications)  Non-Invasive Test Results: No non-invasive testing performed  Prior CABG: No previous CABG       2:53 PM  [name]  has presented today for surgery, with the diagnosis of Chest Pain.  The various methods of treatment have been discussed with the patient and family. After consideration of risks, benefits and other options for treatment, the patient has consented to  Procedure(s): LEFT HEART CATH AND CORONARY ANGIOGRAPHY (N/A) as a surgical intervention.  The patient's history has been reviewed, patient examined, no change in status, stable for surgery.  I have reviewed the patient's chart and labs.  Questions were answered to the patient's satisfaction.     [name]

## 2018-07-24 NOTE — Progress Notes (Signed)
CRITICAL VALUE ALERT  Critical Value:  Troponin 0.03  Date & Time Notied:  07/24/2018 0120  Provider Notified: Baltazar Najjar  Orders Received/Actions taken: Awaiting orders

## 2018-07-24 NOTE — Progress Notes (Signed)
PROGRESS NOTE    [name]  ALP:[id number] DOB: May 26, 1960 DOA: 07/23/2018 PCP: [name], DO   Brief Narrative:  58 y.o. BM PMHx  BPH, CAD, history of NSTEMI in August of 2018, history of chronic chest pain, chronic diastolic CHF, CKD, cluster headaches, trigeminal neuralgia, diverticulosis, Drug abuse, GERD/hiatal hernia, HLD, HTN, Hypothyroidism with history of nontoxic multinodular goiter, perforated nasal septum, pulmonary nodule on CT, history of TIA, tubular adenoma of colon, unspecified asthma   Sent from [facility] after presenting to the emergency department there with complaints of a week and a half of on/off chest pain, sharp, radiated to his lower and lateral chest wall, associated with mild dyspnea, palpitations and mild dizziness.  No worsening or relieving factors.  However, he states that nitroglycerin helped to a certain extent, but has given him a headache.  He denies diaphoresis, PND, orthopnea or pitting edema of the lower extremities.  He has been having multiple episodes of nausea and emesis since the weekend.  On Sunday, he had a single episode of coffee ground emesis, but states that he has had about 8 episodes of emesis daily since Monday.  He denies fever, rhinorrhea, sore throat, but complains of chronic cough.  No wheezing or hemoptysis.  Denies abdominal pain, diarrhea, constipation, melena or hematochezia.  No dysuria, frequency or hematuria.  No polyuria, polydipsia, polyphagia or blurred vision.     Subjective: 4/1 A/O x4, negative S OB, positive CP rated 4/10, positive radiation under left arm, negative diaphoresis.  States prior to admission when exiting shower pain rated 8/10, positive diaphoresis, S OB.    Assessment & Plan:   Principal Problem:   Unstable angina (HCC) Active Problems:   Essential hypertension   Hypothyroidism   CAD (coronary artery disease)   Hypercholesteremia   Intractable nausea and vomiting  Chronic diastolic CHF (congestive heart failure) (HCC)   Lactic acidosis   Unstable angina (HCC)   CAD (coronary artery disease) -Scheduled for cardiac catheterization -Await recommendations from cardiology -Add morphine as needed for pain not relieved by NTG drip -Continue NTG drip -Amlodipine 10 mg daily -Coreg 25 mg twice daily - Plavix 75 mg daily - Hydralazine PRN - Labetalol PRN - Losartan 100 mg daily  Chronic diastolic CHF -See unstable angina -Strict in and out -Daily weight  Essential HTN - See CAD    Lactic acidosis -Resolved    Intractable nausea and vomiting -Resolved, most likely secondary to his chest pain.  -Anti-emetics as needed -Protonix   40 mg twice daily  Hypothyroidism -Not on levothyroxine -TSH pending  Hypothyroidism     HLD - Crestor 40 mg daily -Zetia 10 mg daily -Lipid panel pending.    DVT prophylaxis: Heparin subcu Code Status: Full Family Communication: None Disposition Plan: Cardiology   Consultants:  Cardiology    Procedures/Significant Events:  4/1 cardiac catheterization   I have personally reviewed and interpreted all radiology studies and my findings are as above.  VENTILATOR SETTINGS: None   Cultures None  Antimicrobials: None   Devices None   LINES / TUBES:  None    Continuous Infusions: . sodium chloride    . sodium chloride 75 mL/hr at 07/24/18 0631  . nitroGLYCERIN 15 mcg/min (07/24/18 0650)     Objective: Vitals:   07/24/18 0328 07/24/18 0531 07/24/18 0553 07/24/18 0753  BP: 130/80  (!) 140/96 124/89  Pulse: 83  81 89  Resp: 16  15   Temp: (!) 97.4 F (36.3 C)  98.2 F (36.8 C) 97.8 F (36.6 C)  TempSrc: Oral  Oral Oral  SpO2: 98%  97% 94%  Weight:  105.9 kg    Height:        Intake/Output Summary (Last 24 hours) at 07/24/2018 1354 Last data filed at 07/23/2018 2145 Gross per 24 hour  Intake 1428.5 ml  Output -  Net 1428.5 ml   Filed Weights   07/23/18 0853 07/23/18  1346 07/24/18 0531  Weight: 108.9 kg 105.4 kg 105.9 kg    Examination:  General: A/O x4, no acute respiratory distress Eyes: negative scleral hemorrhage, negative anisocoria, negative icterus ENT: Negative Runny nose, negative gingival bleeding, Neck:  Negative scars, masses, torticollis, lymphadenopathy, JVD Lungs: Clear to auscultation bilaterally without wheezes or crackles, chest pain nonreproducible with palpation Cardiovascular: Regular rate and rhythm without murmur gallop or rub normal S1 and S2 Abdomen: negative abdominal pain, nondistended, positive soft, bowel sounds, no rebound, no ascites, no appreciable mass Extremities: No significant cyanosis, clubbing, or edema bilateral lower extremities Skin: Negative rashes, lesions, ulcers Psychiatric:  Negative depression, negative anxiety, negative fatigue, negative mania  Central nervous system:  Cranial nerves II through XII intact, tongue/uvula midline, all extremities muscle strength 5/5, sensation intact throughout, negative dysarthria, negative expressive aphasia, negative receptive aphasia.  .     Data Reviewed: Care during the described time interval was provided by me .  I have reviewed this patient's available data, including medical history, events of note, physical examination, and all test results as part of my evaluation.   CBC: Recent Labs  Lab 07/23/18 0912 07/23/18 1047 07/24/18 0607  WBC 7.6  --  6.0  NEUTROABS  --   --  2.8  HGB 15.8 16.0 12.3*  HCT 48.4 47.0 36.2*  MCV 98.6  --  95.0  PLT 157  --  875*   Basic Metabolic Panel: Recent Labs  Lab 07/23/18 0937 07/23/18 1039 07/23/18 1047 07/23/18 1137 07/24/18 0607  NA 133* 134* 133*  --  134*  K 4.7 5.1 5.0  --  3.5  CL 96* 98  --   --  102  CO2 10* 10*  --   --  16*  GLUCOSE 101* 103*  --   --  109*  BUN 14 15  --   --  11  CREATININE 1.22 1.15  --   --  1.24  CALCIUM 9.1 8.9  --   --  8.7*  MG  --   --   --  2.0  --    GFR: Estimated  Creatinine Clearance: 82.1 mL/min (by C-G formula based on SCr of 1.24 mg/dL). Liver Function Tests: Recent Labs  Lab 07/24/18 0607  AST 54*  ALT 61*  ALKPHOS 42  BILITOT 2.0*  PROT 6.9  ALBUMIN 3.6   No results for input(s): LIPASE, AMYLASE in the last 168 hours. No results for input(s): AMMONIA in the last 168 hours. Coagulation Profile: No results for input(s): INR, PROTIME in the last 168 hours. Cardiac Enzymes: Recent Labs  Lab 07/23/18 0912 07/23/18 1803 07/24/18 0003 07/24/18 0607 07/24/18 1129  TROPONINI <0.03 <0.03 0.03* 0.03* 0.03*   BNP (last 3 results) No results for input(s): PROBNP in the last 8760 hours. HbA1C: No results for input(s): HGBA1C in the last 72 hours. CBG: No results for input(s): GLUCAP in the last 168 hours. Lipid Profile: No results for input(s): CHOL, HDL, LDLCALC, TRIG, CHOLHDL, LDLDIRECT in the last 72 hours. Thyroid Function Tests: No results for input(s):  TSH, T4TOTAL, FREET4, T3FREE, THYROIDAB in the last 72 hours. Anemia Panel: No results for input(s): VITAMINB12, FOLATE, FERRITIN, TIBC, IRON, RETICCTPCT in the last 72 hours. Urine analysis:    Component Value Date/Time   COLORURINE YELLOW 07/23/2018 1152   APPEARANCEUR CLEAR 07/23/2018 1152   LABSPEC >1.030 (H) 07/23/2018 1152   PHURINE 6.0 07/23/2018 1152   GLUCOSEU NEGATIVE 07/23/2018 1152   GLUCOSEU NEGATIVE 05/27/2018 1421   HGBUR SMALL (A) 07/23/2018 1152   BILIRUBINUR SMALL (A) 07/23/2018 1152   BILIRUBINUR negative 02/23/2017 1539   KETONESUR >80 (A) 07/23/2018 1152   PROTEINUR 100 (A) 07/23/2018 1152   UROBILINOGEN 0.2 05/27/2018 1421   NITRITE NEGATIVE 07/23/2018 1152   LEUKOCYTESUR NEGATIVE 07/23/2018 1152   Sepsis Labs: @LABRCNTIP (procalcitonin:4,lacticidven:4)  )No results found for this or any previous visit (from the past 240 hour(s)).       Radiology Studies: Dg Chest 2 View  Result Date: 07/23/2018 CLINICAL DATA:  Chest pain and nausea. History  of coronary artery disease with prior PCI. EXAM: CHEST - 2 VIEW COMPARISON:  06/07/2018 FINDINGS: Stable heart size. Interval removal of loop recorder device. There is no evidence of pulmonary edema, consolidation, pneumothorax, nodule or pleural fluid. Visualized bony structures are unremarkable. IMPRESSION: No active cardiopulmonary disease. Electronically Signed   By: Aletta Edouard M.D.   On: 07/23/2018 09:32        Scheduled Meds: . amLODipine  10 mg Oral Daily  . aspirin  81 mg Oral Daily  . carvedilol  25 mg Oral BID WC  . clopidogrel  75 mg Oral Daily  . ezetimibe  10 mg Oral Daily  . losartan  100 mg Oral Daily  . pantoprazole (PROTONIX) IV  40 mg Intravenous Q12H  . rosuvastatin  40 mg Oral q1800  . sodium chloride flush  3 mL Intravenous Once  . sodium chloride flush  3 mL Intravenous Q12H   Continuous Infusions: . sodium chloride    . sodium chloride 75 mL/hr at 07/24/18 0631  . nitroGLYCERIN 15 mcg/min (07/24/18 0650)     LOS: 0 days    Time spent: 40 minutes    , Geraldo Docker, MD [facility] Pager [phone]   If 7PM-7AM, please contact night-coverage [email or web address] Password [email or web address] 07/24/2018, 1:54 PM

## 2018-07-24 NOTE — CV Procedure (Addendum)
   Left heart cath with coronary angiography in the setting of chest pain unresponsive to IV nitroglycerin.  Access via real-time vascular ultrasound.  Anatomy is unchanged when compared to post RCA stent procedure performed 2 months ago.  RCA and circumflex stents are widely patent.  No complications.  Chest pain seems to be nonischemic in etiology.  Metabolic acidosis of uncertain etiology.

## 2018-07-24 NOTE — Progress Notes (Signed)
Progress Note  Patient Name: [name] Date of Encounter: 07/24/2018  Primary Cardiologist: Ena Dawley, MD   Subjective   Nausea and vomiting has improved.  Still has a headache from nitroglycerin.  Still having intermittent chest pain but better than prior to arrival.  Less dyspneic.  Inpatient Medications    Scheduled Meds: . amLODipine  10 mg Oral Daily  . aspirin  81 mg Oral Daily  . carvedilol  25 mg Oral BID WC  . clopidogrel  75 mg Oral Daily  . ezetimibe  10 mg Oral Daily  . losartan  100 mg Oral Daily  . pantoprazole (PROTONIX) IV  40 mg Intravenous Q12H  . rosuvastatin  40 mg Oral q1800  . sodium chloride flush  3 mL Intravenous Once  . sodium chloride flush  3 mL Intravenous Q12H   Continuous Infusions: . sodium chloride    . sodium chloride 75 mL/hr at 07/24/18 0631  . sodium chloride 75 mL/hr at 07/24/18 0122  . nitroGLYCERIN 15 mcg/min (07/24/18 0650)   PRN Meds: sodium chloride, fentaNYL (SUBLIMAZE) injection, nitroGLYCERIN, prochlorperazine, sodium chloride flush   Vital Signs    Vitals:   07/23/18 2337 07/24/18 0328 07/24/18 0531 07/24/18 0553  BP: 130/85 130/80  (!) 140/96  Pulse: 91 83  81  Resp: 16 16  15   Temp: (!) 97.3 F (36.3 C) (!) 97.4 F (36.3 C)  98.2 F (36.8 C)  TempSrc: Oral Oral  Oral  SpO2: 93% 98%  97%  Weight:   105.9 kg   Height:        Intake/Output Summary (Last 24 hours) at 07/24/2018 0755 Last data filed at 07/23/2018 2145 Gross per 24 hour  Intake 2428.52 ml  Output -  Net 5732.52 ml   Last 3 Weights 07/24/2018 07/23/2018 07/23/2018  Weight (lbs) 233 lb 8 oz 232 lb 5.8 oz 240 lb  Weight (kg) 105.915 kg 105.4 kg 108.863 kg      Telemetry    Sinus rhythm mostly in the 60s with a brief episode of sinus tachycardia while going to the bathroom this morning- Personally Reviewed  ECG    Sinus rhythm, rate 76.  New anterior T wave inversions.- Personally Reviewed  Physical Exam   GEN:  Resting comfortably  in bed.  No acute distress.   Neck: No JVD or bruit Cardiac: RRR, normal S1 and S2 (distant) no murmurs, rubs, or gallops.  Respiratory: Clear to auscultation bilaterally.  Nonlabored, good air movement GI: Soft, nontender, non-distended.  No HSM MS: No edema; No deformity. Neuro:  Nonfocal.  CN II-XII grossly intact. Psych: Normal affect   Labs    Chemistry Recent Labs  Lab 07/23/18 2025 07/23/18 1039 07/23/18 1047 07/24/18 0607  NA 133* 134* 133* 134*  K 4.7 5.1 5.0 3.5  CL 96* 98  --  102  CO2 10* 10*  --  16*  GLUCOSE 101* 103*  --  109*  BUN 14 15  --  11  CREATININE 1.22 1.15  --  1.24  CALCIUM 9.1 8.9  --  8.7*  PROT  --   --   --  6.9  ALBUMIN  --   --   --  3.6  AST  --   --   --  54*  ALT  --   --   --  61*  ALKPHOS  --   --   --  42  BILITOT  --   --   --  2.0*  GFRNONAA >60 >60  --  >60  GFRAA >60 >60  --  >60  ANIONGAP >20* 26*  --  16*     Hematology Recent Labs  Lab 07/23/18 0912 07/23/18 1047 07/24/18 0607  WBC 7.6  --  6.0  RBC 4.91  --  3.81*  HGB 15.8 16.0 12.3*  HCT 48.4 47.0 36.2*  MCV 98.6  --  95.0  MCH 32.2  --  32.3  MCHC 32.6  --  34.0  RDW 14.3  --  14.0  PLT 157  --  117*    Cardiac Enzymes Recent Labs  Lab 07/23/18 0912 07/23/18 1803 07/24/18 0003 07/24/18 0607  TROPONINI <0.03 <0.03 0.03* 0.03*    Recent Labs  Lab 07/23/18 0930 07/23/18 1143  TROPIPOC 0.01 0.00     BNPNo results for input(s): BNP, PROBNP in the last 168 hours.   DDimer No results for input(s): DDIMER in the last 168 hours.   Radiology    Dg Chest 2 View  Result Date: 07/23/2018 CLINICAL DATA:  Chest pain and nausea. History of coronary artery disease with prior PCI. EXAM: CHEST - 2 VIEW COMPARISON:  06/07/2018 FINDINGS: Stable heart size. Interval removal of loop recorder device. There is no evidence of pulmonary edema, consolidation, pneumothorax, nodule or pleural fluid. Visualized bony structures are unremarkable. IMPRESSION: No active  cardiopulmonary disease. Electronically Signed   By: Aletta Edouard M.D.   On: 07/23/2018 09:32    Cardiac Studies   Cath pending today  Patient Profile     58 y.o. 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   Assessment & Plan    1. Chest Pain with Known CAD-status post recent PCI: Patient presented with intermittent worsening chest pain over the past week both at rest and with exertion but worse with exertion as well as recent dyspnea on exertion. Patient recently had PCI with DES to RCA in 05/2018.  -- EKG this morning with more pronounced T wave inversion in anterior leads.Troponin 0.03 x2. -- remains on Nitroglycerin drip -- Patient is current NPO, will plan for cardiac cath today. MD to discuss plan and risk/benefit of cath. -- continue on ASA, plavix, BB, ARB and statin -> cannot be off of Plavix.  If other medications need to be held that would be okay, but would like to least continue Plavix.  2. Chronic Combined CHF: LVEF of 60-65% on recent Echo in 05/2018.  Euvolemic on exam. - Will need to monitor volume status closely with IV fluids. Net + 2.4L, no output recorded -was likely significantly dehydrated upon arrival.  Seems euvolemic now.  3. Hypertension - Blood pressures have improved. Now back on PO meds. - Amlodipine 10mg  daily, Coreg 25mg  twice daily, Losartan 100mg  daily.  4. Hyperlipidemia - On Crestor 40mg  daily and Zetia 10mg  daily  5. Metabolic Acidosis - Metabolic panel and venous blood gas showed concerning for metabolic acidosis with low CO2 on admission. - Lactic acid elevated at 4.4 in ED, now trending down 1.4.       Signed, Reino Bellis, NP  07/24/2018, 7:55 AM    ATTENDING ATTESTATION  I have seen, examined and evaluated  the patient this AM.  After reviewing all the available data and chart, we discussed the patients  laboratory, study & physical findings as well as symptoms in detail. I agree with the findings, examination as well as impression recommendations as per our discussion.  Exam and history performed by me.  Attending adjustments noted in italics.   Presented with 2 different constellation of symptoms including intractable nausea vomiting which seems to be improving with hydration along with atypical chest tightness pressure symptoms consistent with prior angina. Metabolic acidosis is resolved with hydration.  Renal function stable. Plan for cardiac cath today to evaluate stent patency.  Needs to be on Plavix even if other medications have to be held because of nausea.    Glenetta Hew, M.D., M.S. Interventional Cardiologist   Pager # [phone] Phone # [phone] [address] [place], [place] [address]    For questions or updates, please contact [place] Please consult [email or web address] for contact info under

## 2018-07-25 ENCOUNTER — Telehealth: Payer: Self-pay | Admitting: Gastroenterology

## 2018-07-25 ENCOUNTER — Encounter (HOSPITAL_COMMUNITY): Payer: Self-pay | Admitting: Interventional Cardiology

## 2018-07-25 DIAGNOSIS — I10 Essential (primary) hypertension: Secondary | ICD-10-CM | POA: Diagnosis not present

## 2018-07-25 DIAGNOSIS — I25110 Atherosclerotic heart disease of native coronary artery with unstable angina pectoris: Secondary | ICD-10-CM | POA: Diagnosis not present

## 2018-07-25 DIAGNOSIS — I2510 Atherosclerotic heart disease of native coronary artery without angina pectoris: Secondary | ICD-10-CM | POA: Diagnosis not present

## 2018-07-25 DIAGNOSIS — I200 Unstable angina: Secondary | ICD-10-CM | POA: Diagnosis not present

## 2018-07-25 DIAGNOSIS — I5032 Chronic diastolic (congestive) heart failure: Secondary | ICD-10-CM | POA: Diagnosis not present

## 2018-07-25 LAB — CBC
HCT: 39.9 % (ref 39.0–52.0)
Hemoglobin: 13.6 g/dL (ref 13.0–17.0)
MCH: 32.3 pg (ref 26.0–34.0)
MCHC: 34.1 g/dL (ref 30.0–36.0)
MCV: 94.8 fL (ref 80.0–100.0)
Platelets: 109 10*3/uL — ABNORMAL LOW (ref 150–400)
RBC: 4.21 MIL/uL — ABNORMAL LOW (ref 4.22–5.81)
RDW: 13.7 % (ref 11.5–15.5)
WBC: 5.7 10*3/uL (ref 4.0–10.5)
nRBC: 0.3 % — ABNORMAL HIGH (ref 0.0–0.2)

## 2018-07-25 LAB — BASIC METABOLIC PANEL
Anion gap: 10 (ref 5–15)
BUN: 13 mg/dL (ref 6–20)
CO2: 22 mmol/L (ref 22–32)
Calcium: 9 mg/dL (ref 8.9–10.3)
Chloride: 101 mmol/L (ref 98–111)
Creatinine, Ser: 1.12 mg/dL (ref 0.61–1.24)
GFR calc Af Amer: >60 mL/min (ref >60)
GFR calc non Af Amer: >60 mL/min (ref >60)
Glucose, Bld: 123 mg/dL — ABNORMAL HIGH (ref 70–99)
Potassium: 3.7 mmol/L (ref 3.5–5.1)
Sodium: 133 mmol/L — ABNORMAL LOW (ref 135–145)

## 2018-07-25 LAB — MAGNESIUM: Magnesium: 1.9 mg/dL (ref 1.7–2.4)

## 2018-07-25 LAB — TSH: TSH: 2.925 u[IU]/mL (ref 0.350–4.500)

## 2018-07-25 LAB — LIPID PANEL
Cholesterol: 262 mg/dL — ABNORMAL HIGH (ref 0–200)
HDL: 97 mg/dL (ref >40)
LDL Cholesterol: 135 mg/dL — ABNORMAL HIGH (ref 0–99)
Total CHOL/HDL Ratio: 2.7 RATIO
Triglycerides: 151 mg/dL — ABNORMAL HIGH (ref <150)
VLDL: 30 mg/dL (ref 0–40)

## 2018-07-25 MED ORDER — PANTOPRAZOLE SODIUM 40 MG PO TBEC
40.0000 mg | DELAYED_RELEASE_TABLET | Freq: Two times a day (BID) | ORAL | Status: DC
  Administered 2018-07-25: 40 mg via ORAL
  Filled 2018-07-25: qty 1

## 2018-07-25 NOTE — Telephone Encounter (Signed)
Patient was instructed to call GI for his recent admission for CP, N&V.  Per progress note the N&V was thought to be related to CP .  This and CP have resolved and patient was discharged.  Patient is not sure why he needs to come to GI.  He has some mild nausea now.  He is asked to follow up with his primary and cardiology for now.  He will call back if they think he needs GI workup.  No other GI complaints.  Please advise if you feel different.

## 2018-07-25 NOTE — Progress Notes (Signed)
Pt discharged home with family. IV and telemetry box removed. Pt received discharge instructions and all questions were answered. Pt left with all of his belongings. Pt discharged via wheelchair and was accompanied by this RN.   Ara Kussmaul BSN, RN

## 2018-07-25 NOTE — Discharge Summary (Signed)
Physician Discharge Summary  [name] MQK:[id number] DOB: Sep 02, 1960 DOA: 07/23/2018  PCP: [name], DO  Admit date: 07/23/2018 Discharge date: 07/31/2018  Time spent: 30 minutes  Recommendations for Outpatient Follow-up:  -Per cardiology keep previously scheduled follow-up appointment   Discharge Diagnoses:  Principal Problem:   Unstable angina [facility]) Active Problems:   Essential hypertension   Hypothyroidism   CAD (coronary artery disease)   Hypercholesteremia   Intractable nausea and vomiting   Chronic diastolic CHF (congestive heart failure) (HCC)   Lactic acidosis   Discharge Condition:   Diet recommendation: Heart healthy  Filed Weights   07/23/18 1346 07/24/18 0531 08/07/2018 0620  Weight: 105.4 kg 105.9 kg 106.1 kg    History of present illness:  58 y.o.BM PMHx BPH, CAD, history ofNSTEMIin August of 2018, history of chronic chest pain, chronic diastolic CHF, CKD, cluster headaches, trigeminal neuralgia, diverticulosis, Drug abuse, GERD/hiatal hernia, HLD, HTN, Hypothyroidism with history of nontoxic multinodular goiter, perforated nasal septum, pulmonary nodule on CT, history of TIA, tubular adenoma of colon, unspecified asthma   Sent from [place] after presenting to the emergency department there with complaints of a week and a half of on/off chest pain, sharp, radiated to his lower and lateral chest wall, associated with mild dyspnea, palpitations and mild dizziness. No worsening or relieving factors. However, he states that nitroglycerin helped to a certain extent, but has given him a headache. He denies diaphoresis, PND, orthopnea or pitting edema of the lower extremities. He has been having multiple episodes of nausea and emesis since the weekend. On Sunday, he had a single episode of coffee ground emesis, but states that he has had about 8 episodes of emesis daily since Monday. He denies fever, rhinorrhea, sore throat,  but complains of chronic cough. No wheezing or hemoptysis. Denies abdominal pain, diarrhea, constipation, melena or hematochezia. No dysuria, frequency or hematuria. No polyuria, polydipsia, polyphagia or blurred vision.   Hospital Course:  Unstable angina (HCC) CAD (coronary artery disease) -S/p cardiac catheterization -Amlodipine 10 mg daily -Coreg 25 mg twice daily - Plavix 75 mg daily - Losartan 100 mg daily  Chronic diastolic CHF -See unstable angina -Strict in and out +2.6L -Daily weight Weight change: -2.767 kg -Per cardiology keep previously scheduled follow-up appointment  Essential HTN - See CAD  Lactic acidosis -Resolved  Intractable nausea and vomiting -Resolved, most likely secondary to his chest pain.  -Anti-emetics as needed -Prevacid 30 mg twice daily  Hypothyroidism -Not on levothyroxine -TSH; WNL   HLD - Crestor 40 mg daily -Zetia 10 mg daily -Lipid panel pending.   Procedures: 4/1 Left Heart Cath;   Stable coronary anatomy when compared to post PCI images performed in February 2020.  Short, widely patent left main.  Large LAD that wraps around the left ventricular apex.  Irregularities up to 30% proximal.  Widely patent circumflex with mid vessel 20 to 30% eccentric narrowing, and widely patent stent and the dominant obtuse marginal.  Small branch of the distal circumflex contains 70% narrowing and is stable over time.  Widely patent right coronary including wide patency of the mid to distal stent placed in February 2020.  Low normal LV systolic function with EF 50%.  Normal filling pressures.  Consultations: Cardiology    Discharge Exam: Vitals:   07/24/18 2028 08/16/2018 0620 07/24/2018 0818 08/08/2018 0933  BP: 131/80  125/75 114/85  Pulse: 76  85 87  Resp: 20 20 19    Temp: 98 F (36.7 C)  98.1 F (36.7 C)   TempSrc: Oral  Oral   SpO2: 94%  100%   Weight:  106.1 kg    Height:        General: No acute respiratory  distress Neck:  Negative scars, masses, torticollis, lymphadenopathy, JVD Lungs: Clear to auscultation bilaterally without wheezes or crackles Cardiovascular: Regular rate and rhythm without murmur gallop or rub normal S1 and S2  Discharge Instructions   Allergies as of 08/10/2018      Reactions   Tramadol Palpitations      Medication List    STOP taking these medications   gabapentin 300 MG capsule Commonly known as:  NEURONTIN     TAKE these medications   amLODipine 10 MG tablet Commonly known as:  NORVASC Take 1 tablet (10 mg total) by mouth daily.   aspirin EC 81 MG tablet Take 81 mg by mouth daily.   carvedilol 25 MG tablet Commonly known as:  COREG Take 1 tablet (25 mg total) by mouth 2 (two) times daily.   clopidogrel 75 MG tablet Commonly known as:  PLAVIX TAKE ONE TABLET BY MOUTH DAILY WITH BREAKFAST What changed:  when to take this   ezetimibe 10 MG tablet Commonly known as:  ZETIA Take 1 tablet (10 mg total) by mouth daily.   FLUoxetine 20 MG tablet Commonly known as:  PROZAC Take 1 tablet (20 mg total) by mouth daily. Take 1/2 tab daily for the first 2 weeks. What changed:  additional instructions   fluticasone 50 MCG/ACT nasal spray Commonly known as:  FLONASE Place 2 sprays into both nostrils daily.   lansoprazole 30 MG capsule Commonly known as:  PREVACID Take 1 capsule (30 mg total) by mouth 2 (two) times daily before a meal.   levocetirizine 5 MG tablet Commonly known as:  XYZAL Take 1 tablet (5 mg total) by mouth every evening.   losartan 100 MG tablet Commonly known as:  COZAAR TAKE 1 TABLET (100 MG)  BY MOUTH DAILY What changed:    how much to take  how to take this  when to take this  additional instructions   nitroGLYCERIN 0.4 MG SL tablet Commonly known as:  NITROSTAT Place 1 tablet (0.4 mg total) under the tongue every 5 (five) minutes as needed for chest pain.   promethazine 12.5 MG tablet Commonly known as:   PHENERGAN Take 12.5 mg by mouth every 6 (six) hours as needed for nausea or vomiting.   ranolazine 500 MG 12 hr tablet Commonly known as:  RANEXA Take 1 tablet (500 mg total) by mouth 2 (two) times daily.   rosuvastatin 40 MG tablet Commonly known as:  CRESTOR Take 1 tablet (40 mg total) by mouth at bedtime.   SYSTANE OP Place 1 drop into both eyes every 3 (three) hours as needed (dry eyes).      Allergies  Allergen Reactions  . Tramadol Palpitations   Follow-up Information    Dorothy Spark, MD Follow up.   Specialty:  Cardiology Why:  Keep May 20 follow-up appointment Contact information: [address] [place] [address] [phone]            The results of significant diagnostics from this hospitalization (including imaging, microbiology, ancillary and laboratory) are listed below for reference.    Significant Diagnostic Studies: Dg Chest 2 View  Result Date: 07/23/2018 CLINICAL DATA:  Chest pain and nausea. History of coronary artery disease with prior PCI. EXAM: CHEST - 2 VIEW  COMPARISON:  06/07/2018 FINDINGS: Stable heart size. Interval removal of loop recorder device. There is no evidence of pulmonary edema, consolidation, pneumothorax, nodule or pleural fluid. Visualized bony structures are unremarkable. IMPRESSION: No active cardiopulmonary disease. Electronically Signed   By: Aletta Edouard M.D.   On: 07/23/2018 09:32    Microbiology: No results found for this or any previous visit (from the past 240 hour(s)).   Labs: Basic Metabolic Panel: Recent Labs  Lab 07/23/18 0937 07/23/18 1039 07/23/18 1047 07/23/18 1137 07/24/18 0607 08/08/2018 0326  NA 133* 134* 133*  --  134* 133*  K 4.7 5.1 5.0  --  3.5 3.7  CL 96* 98  --   --  102 101  CO2 10* 10*  --   --  16* 22  GLUCOSE 101* 103*  --   --  109* 123*  BUN 14 15  --   --  11 13  CREATININE 1.22 1.15  --   --  1.24 1.12  CALCIUM 9.1 8.9  --   --  8.7* 9.0  MG  --   --   --   2.0  --  1.9   Liver Function Tests: Recent Labs  Lab 07/24/18 0607  AST 54*  ALT 61*  ALKPHOS 42  BILITOT 2.0*  PROT 6.9  ALBUMIN 3.6   No results for input(s): LIPASE, AMYLASE in the last 168 hours. No results for input(s): AMMONIA in the last 168 hours. CBC: Recent Labs  Lab 07/23/18 0912 07/23/18 1047 07/24/18 0607 08/18/2018 0326  WBC 7.6  --  6.0 5.7  NEUTROABS  --   --  2.8  --   HGB 15.8 16.0 12.3* 13.6  HCT 48.4 47.0 36.2* 39.9  MCV 98.6  --  95.0 94.8  PLT 157  --  117* 109*   Cardiac Enzymes: Recent Labs  Lab 07/23/18 0912 07/23/18 1803 07/24/18 0003 07/24/18 0607 07/24/18 1129  TROPONINI <0.03 <0.03 0.03* 0.03* 0.03*   BNP: BNP (last 3 results) No results for input(s): BNP in the last 8760 hours.  ProBNP (last 3 results) No results for input(s): PROBNP in the last 8760 hours.  CBG: No results for input(s): GLUCAP in the last 168 hours.     Signed:  Dia Crawford, MD [facility] [phone] pager

## 2018-07-25 NOTE — Telephone Encounter (Signed)
The follow up plans are not clear to me from the discharge summary. We are under COVID-19 scheduling restrictions. He should follow up with his PCP and they can determine if GI follow up is needed.

## 2018-07-25 NOTE — Progress Notes (Signed)
Progress Note  Patient Name: [name] Date of Encounter: 08/13/2018  Primary Cardiologist: Ena Dawley, MD   Subjective   CP is improved, relieved to know cath was clean. No SOB.  Inpatient Medications    Scheduled Meds: . amLODipine  10 mg Oral Daily  . aspirin  81 mg Oral Daily  . carvedilol  25 mg Oral BID WC  . clopidogrel  75 mg Oral Daily  . ezetimibe  10 mg Oral Daily  . heparin  5,000 Units Subcutaneous Q8H  . losartan  100 mg Oral Daily  . pantoprazole  40 mg Oral BID  . rosuvastatin  40 mg Oral q1800  . sodium chloride flush  3 mL Intravenous Once  . sodium chloride flush  3 mL Intravenous Q12H   Continuous Infusions: . sodium chloride     PRN Meds: sodium chloride, acetaminophen, fentaNYL (SUBLIMAZE) injection, morphine injection, nitroGLYCERIN, ondansetron (ZOFRAN) IV, prochlorperazine, sodium chloride flush   Vital Signs    Vitals:   07/24/18 1625 07/24/18 2028 08/11/2018 0620 08/12/2018 0818  BP: 114/80 131/80  125/75  Pulse: 67 76  85  Resp: 13 20 20 19   Temp: 97.7 F (36.5 C) 98 F (36.7 C)  98.1 F (36.7 C)  TempSrc: Oral Oral  Oral  SpO2: 95% 94%  100%  Weight:   106.1 kg   Height:       No intake or output data in the 24 hours ending 08/17/2018 0924 Last 3 Weights 08/18/2018 07/24/2018 07/23/2018  Weight (lbs) 233 lb 14.4 oz 233 lb 8 oz 232 lb 5.8 oz  Weight (kg) 106.096 kg 105.915 kg 105.4 kg      Telemetry    SR, ST, no sig ectopy- Personally Reviewed  ECG    07/24/2018: SR, rate 77, anterolateral T wave inversions, different from 03/31.- Personally Reviewed  Physical Exam   General: Well developed, well nourished, male in no acute distress Head: Eyes PERRLA, No xanthomas.   Normocephalic and atraumatic Lungs: Clear bilaterally to auscultation. Heart: HRRR S1 S2, without MRG.  Pulses are 2+ & equal. No JVD. R radial cath site w/out ecchymosis or hematoma Abdomen: Bowel sounds are present, abdomen soft and non-tender without  masses or  hernias noted. Msk: Normal strength and tone for age. Extremities: No clubbing, cyanosis or edema.    Skin:  No rashes or lesions noted. Neuro: Alert and oriented X 3. Psych:  Good affect, responds appropriately  Labs    Chemistry Recent Labs  Lab 07/23/18 1039 07/23/18 1047 07/24/18 0607 07/26/2018 0326  NA 134* 133* 134* 133*  K 5.1 5.0 3.5 3.7  CL 98  --  102 101  CO2 10*  --  16* 22  GLUCOSE 103*  --  109* 123*  BUN 15  --  11 13  CREATININE 1.15  --  1.24 1.12  CALCIUM 8.9  --  8.7* 9.0  PROT  --   --  6.9  --   ALBUMIN  --   --  3.6  --   AST  --   --  54*  --   ALT  --   --  61*  --   ALKPHOS  --   --  42  --   BILITOT  --   --  2.0*  --   GFRNONAA >60  --  >60 >60  GFRAA >60  --  >60 >60  ANIONGAP 26*  --  16* 10     Hematology Recent  Labs  Lab 07/23/18 0912 07/23/18 1047 07/24/18 0607 08/06/2018 0326  WBC 7.6  --  6.0 5.7  RBC 4.91  --  3.81* 4.21*  HGB 15.8 16.0 12.3* 13.6  HCT 48.4 47.0 36.2* 39.9  MCV 98.6  --  95.0 94.8  MCH 32.2  --  32.3 32.3  MCHC 32.6  --  34.0 34.1  RDW 14.3  --  14.0 13.7  PLT 157  --  117* 109*    Cardiac Enzymes Recent Labs  Lab 07/23/18 1803 07/24/18 0003 07/24/18 0607 07/24/18 1129  TROPONINI <0.03 0.03* 0.03* 0.03*    Recent Labs  Lab 07/23/18 0930 07/23/18 1143  TROPIPOC 0.01 0.00     Radiology    Dg Chest 2 View  Result Date: 07/23/2018 CLINICAL DATA:  Chest pain and nausea. History of coronary artery disease with prior PCI. EXAM: CHEST - 2 VIEW COMPARISON:  06/07/2018 FINDINGS: Stable heart size. Interval removal of loop recorder device. There is no evidence of pulmonary edema, consolidation, pneumothorax, nodule or pleural fluid. Visualized bony structures are unremarkable. IMPRESSION: No active cardiopulmonary disease. Electronically Signed   By: Aletta Edouard M.D.   On: 07/23/2018 09:32    Cardiac Studies   CARDIAC CATH: 07/24/2018  Stable coronary anatomy when compared to post PCI  images performed in February 2020.  Short, widely patent left main.  Large LAD that wraps around the left ventricular apex.  Irregularities up to 30% proximal.  Widely patent circumflex with mid vessel 20 to 30% eccentric narrowing, and widely patent stent and the dominant obtuse marginal.  Small branch of the distal circumflex contains 70% narrowing and is stable over time.  Widely patent right coronary including wide patency of the mid to distal stent placed in February 2020.  Low normal LV systolic function with EF 50%.  Normal filling pressures.  RECOMMENDATIONS:   IV nitroglycerin was discontinued.  Consider alternative explanations for the patient's chest pain.  Patient Profile     58 y.o. 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   Assessment & Plan    1. Chest Pain with Known CAD-status post recent PCI:  -Status post DES RCA 05/2018, admitted 3/31 with worsening chest pain - Enzymes not significantly elevated - Previously placed stent patent and no significant progression of residual disease on cath 4/1, he had 50% - The reason for the new T wave inversions on his ECG is unclear -Continue ASA, BB, ARB, statin and especially Plavix.  Continue Ranexa -No documented spasm, but is already on a beta-blocker and amlodipine 10 mg daily - No further cardiac work-up is indicated at this time -Keep previously scheduled follow-up appointment  2. Chronic Combined CHF:  - EF 50% at cath yesterday - History of diastolic dysfunction on echo - Encourage daily weights and a low-sodium diet as an outpatient. -May have been dehydrated on admission, suspect 234 pounds is dry weight -He was not on a diuretic prior to admission, creatinine 1.37 at discharge and  1.22 on admission  3. Hypertension -Blood pressure well controlled on current meds  4. Hyperlipidemia - Continue Crestor 40 mg daily and Zetia 10 mg daily -Reinforce compliance  5. Metabolic Acidosis - seen on admission w/ elevated Lactate as well - all have improved - per IM  Signed, Rosaria Ferries, PA-C  08/16/2018, 9:24 AM    [facility] will sign off.   Medication Recommendations:  See note above, continue home meds Other recommendations (labs, testing, etc):  none Follow up as an outpatient:  Keep f/u appt

## 2018-07-25 NOTE — Telephone Encounter (Signed)
Pt was just d/c from hospital, he was told that his blood was too acidic and to follow up with hi GI MD. Pls advise on scheduling.

## 2018-07-30 DIAGNOSIS — I5032 Chronic diastolic (congestive) heart failure: Secondary | ICD-10-CM | POA: Diagnosis not present

## 2018-08-23 NOTE — Death Summary Note (Signed)
.  cjw

## 2018-08-23 DEATH — deceased

## 2018-08-29 ENCOUNTER — Telehealth: Payer: Self-pay | Admitting: *Deleted

## 2018-08-29 NOTE — Telephone Encounter (Signed)
..   Virtual Visit Pre-Appointment Phone Call  "(Name), I am calling you today to discuss your upcoming appointment. We are currently trying to limit exposure to the virus that causes COVID-19 by seeing patients at home rather than in the office."  1. "What is the BEST phone number to call the day of the visit?" - include this in appointment notes  2. Do you have or have access to (through a family member/friend) a smartphone with video capability that we can use for your visit?" a. If yes - list this number in appt notes as cell (if different from BEST phone #) and list the appointment type as a VIDEO visit in appointment notes b. If no - list the appointment type as a PHONE visit in appointment notes  3. Confirm consent - "In the setting of the current Covid19 crisis, you are scheduled for a (phone or video) visit with your provider on (date) at (time).  Just as we do with many in-office visits, in order for you to participate in this visit, we must obtain consent.  If you'd like, I can send this to your mychart (if signed up) or email for you to review.  Otherwise, I can obtain your verbal consent now.  All virtual visits are billed to your insurance company just like a normal visit would be.  By agreeing to a virtual visit, we'd like you to understand that the technology does not allow for your provider to perform an examination, and thus may limit your provider's ability to fully assess your condition. If your provider identifies any concerns that need to be evaluated in person, we will make arrangements to do so.  Finally, though the technology is pretty good, we cannot assure that it will always work on either your or our end, and in the setting of a video visit, we may have to convert it to a phone-only visit.  In either situation, we cannot ensure that we have a secure connection.  Are you willing to proceed?" STAFF: Did the patient verbally acknowledge consent to telehealth visit? Document  YES/NO here: YES  4. Advise patient to be prepared - "Two hours prior to your appointment, go ahead and check your blood pressure, pulse, oxygen saturation, and your weight (if you have the equipment to check those) and write them all down. When your visit starts, your provider will ask you for this information. If you have an Apple Watch or Kardia device, please plan to have heart rate information ready on the day of your appointment. Please have a pen and paper handy nearby the day of the visit as well."  5. Give patient instructions for MyChart download to smartphone OR [email or web address] as below if video visit (depending on what platform provider is using)YES  6. Inform patient they will receive a phone call 15 minutes prior to their appointment time (may be from unknown caller ID) so they should be prepared to answer YES    TELEPHONE CALL NOTE  [name] has been deemed a candidate for a follow-up tele-health visit to limit community exposure during the Covid-19 pandemic. I spoke with the patient via phone to ensure availability of phone/video source, confirm preferred email & phone number, and discuss instructions and expectations.  I reminded [name] to be prepared with any vital sign and/or heart rhythm information that could potentially be obtained via home monitoring, at the time of his visit. I reminded [name] to expect a phone call prior to his  visit.  [name], CMA 08/29/2018 1:59 PM    IF USING DOXIMITY or DOXY.ME - The patient will receive a link just prior to their visit by text.YES HE IS AWARE     FULL LENGTH CONSENT FOR TELE-HEALTH VISIT   I hereby voluntarily request, consent and authorize [facility] and its employed or contracted physicians, physician assistants, nurse practitioners or other licensed health care professionals (the Practitioner), to provide me with telemedicine health care services (the Services") as deemed necessary by  the treating Practitioner. I acknowledge and consent to receive the Services by the Practitioner via telemedicine. I understand that the telemedicine visit will involve communicating with the Practitioner through live audiovisual communication technology and the disclosure of certain medical information by electronic transmission. I acknowledge that I have been given the opportunity to request an in-person assessment or other available alternative prior to the telemedicine visit and am voluntarily participating in the telemedicine visit.  I understand that I have the right to withhold or withdraw my consent to the use of telemedicine in the course of my care at any time, without affecting my right to future care or treatment, and that the Practitioner or I may terminate the telemedicine visit at any time. I understand that I have the right to inspect all information obtained and/or recorded in the course of the telemedicine visit and may receive copies of available information for a reasonable fee.  I understand that some of the potential risks of receiving the Services via telemedicine include:   Delay or interruption in medical evaluation due to technological equipment failure or disruption;  Information transmitted may not be sufficient (e.g. poor resolution of images) to allow for appropriate medical decision making by the Practitioner; and/or   In rare instances, security protocols could fail, causing a breach of personal health information.  Furthermore, I acknowledge that it is my responsibility to provide information about my medical history, conditions and care that is complete and accurate to the best of my ability. I acknowledge that Practitioner's advice, recommendations, and/or decision may be based on factors not within their control, such as incomplete or inaccurate data provided by me or distortions of diagnostic images or specimens that may result from electronic transmissions. I  understand that the practice of medicine is not an exact science and that Practitioner makes no warranties or guarantees regarding treatment outcomes. I acknowledge that I will receive a copy of this consent concurrently upon execution via email to the email address I last provided but may also request a printed copy by calling the office of [facility].    I understand that my insurance will be billed for this visit.   I have read or had this consent read to me.  I understand the contents of this consent, which adequately explains the benefits and risks of the Services being provided via telemedicine.   I have been provided ample opportunity to ask questions regarding this consent and the Services and have had my questions answered to my satisfaction.  I give my informed consent for the services to be provided through the use of telemedicine in my medical care  By participating in this telemedicine visit I agree to the above. PATIENT AWARE THAT CONSENT TO TREAT WILL BE SENT TO HIS ACTIVE MYCHART FOR HIM TO REVIEW PRIOR TO HIS 09/11/2018 APPT

## 2018-09-11 ENCOUNTER — Telehealth (INDEPENDENT_AMBULATORY_CARE_PROVIDER_SITE_OTHER): Payer: 59 | Admitting: Cardiology

## 2018-09-11 ENCOUNTER — Telehealth (HOSPITAL_COMMUNITY): Payer: Self-pay | Admitting: *Deleted

## 2018-09-11 ENCOUNTER — Telehealth: Payer: Self-pay | Admitting: Cardiology

## 2018-09-11 ENCOUNTER — Encounter: Payer: Self-pay | Admitting: Cardiology

## 2018-09-11 ENCOUNTER — Other Ambulatory Visit: Payer: Self-pay

## 2018-09-11 VITALS — BP 134/92 | HR 72 | Ht 70.5 in | Wt 232.0 lb

## 2018-09-11 DIAGNOSIS — E785 Hyperlipidemia, unspecified: Secondary | ICD-10-CM

## 2018-09-11 DIAGNOSIS — I2510 Atherosclerotic heart disease of native coronary artery without angina pectoris: Secondary | ICD-10-CM

## 2018-09-11 DIAGNOSIS — I214 Non-ST elevation (NSTEMI) myocardial infarction: Secondary | ICD-10-CM

## 2018-09-11 DIAGNOSIS — Z95820 Peripheral vascular angioplasty status with implants and grafts: Secondary | ICD-10-CM

## 2018-09-11 DIAGNOSIS — I300 Acute nonspecific idiopathic pericarditis: Secondary | ICD-10-CM

## 2018-09-11 DIAGNOSIS — I2583 Coronary atherosclerosis due to lipid rich plaque: Secondary | ICD-10-CM

## 2018-09-11 DIAGNOSIS — Z955 Presence of coronary angioplasty implant and graft: Secondary | ICD-10-CM

## 2018-09-11 MED ORDER — COLCHICINE 0.6 MG PO TABS: 0.6000 mg | ORAL_TABLET | Freq: Two times a day (BID) | ORAL | 0 refills | Status: DC

## 2018-09-11 NOTE — Telephone Encounter (Signed)
Called pt for advisement of cardiac rehab referral. Pt had completed orientation at [facility] but did not start exercise due to covid - 19 concerns. Pt is interested in virtual cardiac rehab.  Pt  who was driving to [place] in the rain requested that I call him back on tomorrow to discuss in more detail. Cherre Huger, BSN Cardiac and Training and development officer

## 2018-09-11 NOTE — Addendum Note (Signed)
Addended by: Nuala Alpha on: 09/11/2018 01:52 PM   Modules accepted: Orders

## 2018-09-11 NOTE — Patient Instructions (Addendum)
Medication Instructions:   START TAKING COLCHICINE 0.6 MG BY MOUTH TWICE DAILY WITH MEALS FOR 3 MONTHS ONLY.  If you need a refill on your cardiac medications before your next appointment, please call your pharmacy.    *You have been referred back to [facility]--WE SENT A MESSAGE TO [place] RN AT [facility] TO REACH OUT TO YOU AND GET YOU SET BACK UP WITH THEIR SERVICES.  THEY WILL BE IN CONTACT WITH YOU IN THE NEAR FUTURE.   *You have been referred to OUR [facility] TO SEE OUR PHARMACIST FOR CONSULTATION AND  CONSIDERATION OF PCSK9-INHIBITORS-OUR OFFICE WILL BE IN CONTACT WITH YOU TO ARRANGE THIS CONSULT APPOINTMENT--THIS WILL MORE THAN LIKELY BE A VIRTUAL CONSULT APPOINTMENT.    Follow-Up:  IN 4 WEEKS WITH AN EXTENDER ON DR NELSON'S TEAM--THIS WILL BE MORE THAN LIKELY ANOTHER VIRTUAL VISIT--OUR SCHEDULING DEPARTMENT WILL BE IN CONTACT WITH YOU SOON TO ARRANGE THIS APPOINTMENT.

## 2018-09-11 NOTE — Telephone Encounter (Signed)
New message:    Patient states some one was going to call him back after his appt. Did not see a note.

## 2018-09-11 NOTE — Progress Notes (Signed)
Virtual Visit via Video Note   This visit type was conducted due to national recommendations for restrictions regarding the COVID-19 Pandemic (e.g. social distancing) in an effort to limit this patient's exposure and mitigate transmission in our community.  Due to his co-morbid illnesses, this patient is at least at moderate risk for complications without adequate follow up.  This format is felt to be most appropriate for this patient at this time.  All issues noted in this document were discussed and addressed.  A limited physical exam was performed with this format.  Please refer to the patient's chart for his consent to telehealth for [facility].   Date:  09/11/2018   ID:  [name], DOB 28-Dec-1960, MRN [id number]  Patient Location: Home Provider Location: Home  PCP:  [name], DO  Cardiologist:  Ena Dawley, MD  Electrophysiologist:  None   Evaluation Performed:  Follow-Up Visit  Chief Complaint: Chest pain, palpitations.  History of Present Illness:    [name] is a 58 y.o. male with history of CAD status post STEMI treated with DES to the circumflex OM 11/2016 with follow-up cath 1 week later showed patent stent and LVEF improved to 55 to 60%.  No ischemia on Lexiscan 07/2017.  Patient had repeat nuclear stress test for chest pain and had evidence of ischemia along the a.m. Patient underwent cardiac catheterization 06/10/2018 and had successful DES to the mid RCA and had residual 70% inferior branch of the OM 2 with patent OM 2 stent and moderate nonobstructive LAD disease.  If patient has recurrent chest pain refractory to antianginal therapy PCI to inferior branch to the OM 2 could be considered although this is a small vessel.  Aggressive medical management recommended.  He was seen by Estella Husk on 06/26/2018 and stated that RCA stent has made all the difference in the world for him.  No further chest pain.  Complains of insomnia.  Blood  pressure has been running high at home.  He was again readmitted on July 23, 2018 with chest pains and underwent repeat cardiac catheterization that showed patent RCA stent with no progression of disease.  09/11/2018 -today he states that he continues to have chest pains not related to exertion, might be slightly worse on deep inspiration, it comes and goes, is left-sided and sharp.  Not associated with shortness of breath.  He has been compliant with rosuvastatin but his LDL is 135 despite that.  He was previously enrolled in cardiac rehab but this was uninterrupted because of COVID-19.  The patient does not have symptoms concerning for COVID-19 infection (fever, chills, cough, or new shortness of breath).    Past Medical History:  Diagnosis Date  . BPH (benign prostatic hyperplasia)   . CAD (coronary artery disease)    a. NSTEMI troponin >65 with cath 11/2016 S/p PTCA & DES to mid circumflex; irregularies in LAD & RCA, LVEDP 24, EF 50% and 45-50% by echo.  . Chronic chest pain   . Chronic combined systolic and diastolic CHF (congestive heart failure) ([place])    a. EF 45% in 2018. b. EF 55-60% by echo 01/2017. c. EF 48% by nuc 07/2017.  . CKD (chronic kidney disease), stage II   . Cluster headache    hx of  . Complication of anesthesia    difficult waking up"  . Diverticulosis   . Drug abuse ([place])    hx of  . GERD (gastroesophageal reflux disease)   . Hiatal hernia   .  Hyperlipidemia   . Hypertension   . Hypothyroidism   . Nontoxic multinodular goiter   . NSTEMI (non-ST elevated myocardial infarction) ([place])   . Perforated nasal septum   . Pulmonary nodule --- CT 10/19/2014: Nodule is stable, no further routine x-rays 01/06/2009  . TIA (transient ischemic attack)   . Trigeminal neuralgia   . Tubular adenoma of colon 12/2015  . Unspecified asthma(493.90)    Past Surgical History:  Procedure Laterality Date  . APPENDECTOMY    . CARDIAC CATHETERIZATION N/A 11/25/2014   Procedure:  Left Heart Cath and Coronary Angiography;  Surgeon: Belva Crome, MD;  Location: [place] [facility];  Service: Cardiovascular;  Laterality: N/A;  . CARDIAC CATHETERIZATION  12/08/2008   Archie Endo 08/24/2010  . COLECTOMY  ~ 1976   "I had a blockage"  . CORONARY STENT INTERVENTION N/A 11/27/2016   Procedure: CORONARY STENT INTERVENTION;  Surgeon: Belva Crome, MD;  Location: [place] [facility];  Service: Cardiovascular;  Laterality: N/A;  . CORONARY STENT INTERVENTION N/A 06/10/2018   Procedure: CORONARY STENT INTERVENTION;  Surgeon: Nelva Bush, MD;  Location: [place] [facility];  Service: Cardiovascular;  Laterality: N/A;  . CORONARY STENT PLACEMENT  11/27/2016   STENT XIENCE ALPINE RX 3.0X15 drug eluting stent was successfully placed  . CYST EXCISION Left 10/31/2016   Jaw  . DESTRUCTION TRIGEMINAL NERVE VIA NEUROLYTIC AGENT  x 3 , R side last ~2010  . KNEE ARTHROSCOPY Right 06/26/2000   Arthroscopy followed by open lateral release/notes 09/06/2010  . LEFT HEART CATH AND CORONARY ANGIOGRAPHY N/A 11/27/2016   Procedure: LEFT HEART CATH AND CORONARY ANGIOGRAPHY;  Surgeon: Belva Crome, MD;  Location: [place] [facility];  Service: Cardiovascular;  Laterality: N/A;  . LEFT HEART CATH AND CORONARY ANGIOGRAPHY N/A 12/07/2016   Procedure: LEFT HEART CATH AND CORONARY ANGIOGRAPHY;  Surgeon: Nelva Bush, MD;  Location: [place] [facility];  Service: Cardiovascular;  Laterality: N/A;  . LEFT HEART CATH AND CORONARY ANGIOGRAPHY N/A 06/10/2018   Procedure: LEFT HEART CATH AND CORONARY ANGIOGRAPHY;  Surgeon: Nelva Bush, MD;  Location: [place] [facility];  Service: Cardiovascular;  Laterality: N/A;  . LEFT HEART CATH AND CORONARY ANGIOGRAPHY N/A 07/24/2018   Procedure: LEFT HEART CATH AND CORONARY ANGIOGRAPHY;  Surgeon: Belva Crome, MD;  Location: [place] [facility];  Service: Cardiovascular;  Laterality: N/A;  . 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 Ardine Bjork 09/06/2010  .  SHOULDER HEMI-ARTHROPLASTY Right 05/26/2010   revision/notes 05/27/2010     Current Meds  Medication Sig  . amLODipine (NORVASC) 10 MG tablet Take 1 tablet (10 mg total) by mouth daily.  Marland Kitchen aspirin EC 81 MG tablet Take 81 mg by mouth daily.  . carvedilol (COREG) 25 MG tablet Take 1 tablet (25 mg total) by mouth 2 (two) times daily.  . clopidogrel (PLAVIX) 75 MG tablet Take 75 mg by mouth daily.  Marland Kitchen ezetimibe (ZETIA) 10 MG tablet Take 1 tablet (10 mg total) by mouth daily.  Marland Kitchen FLUoxetine (PROZAC) 20 MG tablet Take 20 mg by mouth daily.  . fluticasone (FLONASE) 50 MCG/ACT nasal spray Place 2 sprays into both nostrils daily.  . lansoprazole (PREVACID) 30 MG capsule Take 1 capsule (30 mg total) by mouth 2 (two) times daily before a meal.  . levocetirizine (XYZAL) 5 MG tablet Take 1 tablet (5 mg total) by mouth every evening.  Marland Kitchen losartan (COZAAR) 100 MG tablet Take 100 mg by mouth daily.  . nitroGLYCERIN (  NITROSTAT) 0.4 MG SL tablet Place 1 tablet (0.4 mg total) under the tongue every 5 (five) minutes as needed for chest pain.  Vladimir Faster Glycol-Propyl Glycol (SYSTANE OP) Place 1 drop into both eyes every 3 (three) hours as needed (dry eyes).   . promethazine (PHENERGAN) 12.5 MG tablet Take 12.5 mg by mouth every 6 (six) hours as needed for nausea or vomiting.  . ranolazine (RANEXA) 500 MG 12 hr tablet Take 1 tablet (500 mg total) by mouth 2 (two) times daily.  . rosuvastatin (CRESTOR) 40 MG tablet Take 1 tablet (40 mg total) by mouth at bedtime.     Allergies:   Tramadol   Social History   Tobacco Use  . Smoking status: Former Smoker    Packs/day: 0.25    Years: 12.00    Pack years: 3.00    Types: Cigarettes    Last attempt to quit: 11/15/2016    Years since quitting: 1.8  . Smokeless tobacco: Never Used  Substance Use Topics  . Alcohol use: Yes    Comment: occ  . Drug use: No     Family Hx: The patient's family history includes CAD in his father; Cancer in his father; Diabetes in  his mother; Heart disease in his father and maternal aunt; Heart failure in his father; Hyperlipidemia in his mother; Hypertension in his father and mother; Mesothelioma in his father; Sudden death in his father. There is no history of Prostate cancer or Colon cancer.  ROS:   Please see the history of present illness.    All other systems reviewed and are negative.   Prior CV studies:   The following studies were reviewed today:   Labs/Other Tests and Data Reviewed:    EKG:  No ECG reviewed.  Recent Labs: 07/24/2018: ALT 61 08/09/2018: BUN 13; Creatinine, Ser 1.12; Hemoglobin 13.6; Magnesium 1.9; Platelets 109; Potassium 3.7; Sodium 133; TSH 2.925   Recent Lipid Panel Lab Results  Component Value Date/Time   CHOL 262 (H) 08/08/2018 03:26 AM   CHOL 245 (H) 05/29/2017 09:15 AM   TRIG 151 (H) 08/10/2018 03:26 AM   HDL 97 07/29/2018 03:26 AM   HDL 66 05/29/2017 09:15 AM   CHOLHDL 2.7 08/09/2018 03:26 AM   LDLCALC 135 (H) 08/19/2018 03:26 AM   LDLCALC 160 (H) 05/29/2017 09:15 AM   LDLDIRECT 209.8 07/20/2009 09:51 AM    Wt Readings from Last 3 Encounters:  09/11/18 232 lb (105.2 kg)  08/16/2018 233 lb 14.4 oz (106.1 kg)  06/26/18 245 lb 12.8 oz (111.5 kg)     Objective:    Vital Signs:  BP (!) 134/92   Pulse 72   Ht 5' 10.5" (1.791 m)   Wt 232 lb (105.2 kg)   BMI 32.82 kg/m    VITAL SIGNS:  reviewed     ASSESSMENT & PLAN:    1. Chest Pain with Known CAD-status post recent PCI:  -Status post DES RCA 05/2018, admitted 3/31 with worsening chest pain - Enzymes not significantly elevated - Previously placed stent patent and no significant progression of residual disease on cath 4/1, he had 50% -Continue ASA, BB, ARB, statin and especially Plavix.  Continue Ranexa -His pain right now is very atypical suspicious for acute pericarditis, I will add colchicine 0.6 mg p.o. twice daily for 3 months and follow-up in 4 weeks to see what his symptoms are at this point.  I will not add  ibuprofen given that he is already on dual antiplatelet therapy. -We  will re-enroll in cardiac rehab, currently virtual rehab.  2. Chronic Combined CHF: - EF 50% at cath yesterday - History of diastolic dysfunction on echo - He appears euvolemic now.  3. Hypertension -Blood pressure well controlled on current meds  4. Hyperlipidemia -Continue Crestor 40 mg daily and Zetia 10 mg daily for now. -Pete lipids in April showed LDL of 135 and triglycerides of 151, we will refer to the lipid clinic for consideration of PCSK9 inhibitors.   COVID-19 Education: The signs and symptoms of COVID-19 were discussed with the patient and how to seek care for testing (follow up with PCP or arrange E-visit).  The importance of social distancing was discussed today.  Time:   Today, I have spent 25 minutes with the patient with telehealth technology discussing the above problems.     Medication Adjustments/Labs and Tests Ordered: Current medicines are reviewed at length with the patient today.  Concerns regarding medicines are outlined above.   Tests Ordered: No orders of the defined types were placed in this encounter.   Medication Changes: No orders of the defined types were placed in this encounter.   Disposition:  Follow up in 3 month(s)  Signed, Ena Dawley, MD  09/11/2018 10:57 AM    [place]

## 2018-09-11 NOTE — Telephone Encounter (Signed)
Went over the pts AVS from today with Dr Meda Coffee.  He is aware he will receive several calls from our office and cardiac rehab, to arrange appts needed. Pt verbalized understanding and agrees with this plan.

## 2018-09-12 ENCOUNTER — Telehealth (INDEPENDENT_AMBULATORY_CARE_PROVIDER_SITE_OTHER): Payer: Self-pay | Admitting: Pharmacist

## 2018-09-12 ENCOUNTER — Telehealth (HOSPITAL_COMMUNITY): Payer: Self-pay | Admitting: *Deleted

## 2018-09-12 ENCOUNTER — Telehealth: Payer: Self-pay | Admitting: Cardiology

## 2018-09-12 ENCOUNTER — Telehealth: Payer: Self-pay | Admitting: *Deleted

## 2018-09-12 DIAGNOSIS — E785 Hyperlipidemia, unspecified: Secondary | ICD-10-CM

## 2018-09-12 MED ORDER — EVOLOCUMAB 140 MG/ML ~~LOC~~ SOAJ: 140.0000 mg | SUBCUTANEOUS | 11 refills | Status: DC

## 2018-09-12 NOTE — Telephone Encounter (Signed)
-----   Message from Holley Dexter sent at 09/12/2018 10:38 AM EDT ----- Regarding: lipid clinic/4week follow Need consult with Lipid Clinic per AVS 09-11-18 Pt is aware of 4 week follow up 10-17-18(First available Dayna Dunn,PA-C) Pt informed 10-17-18 visit may be in office or virtual. (not determined at this point) Pt informed to expect a call from [facility].  Thanks renee

## 2018-09-12 NOTE — Telephone Encounter (Signed)
Called as pt had requested from yesterday.  Voicemail full unable to leave message for pt.  Will try later. Cherre Huger, BSN Cardiac and Training and development officer

## 2018-09-12 NOTE — Telephone Encounter (Signed)
New Message          Patient is returning someone's call would like a call back

## 2018-09-12 NOTE — Progress Notes (Addendum)
Patient ID: [name]                 DOB: [birth year]                    MRN: [id number]     HPI: [name] is a 58 y.o. male patient referred to lipid clinic by Dr. Meda Coffee. PMH is significant for CAD s/p STEMI with DES in 2018 and 6269, mixed systolic and diastolic CHF, GERD, CKD stage II, HTN, and hyperlipidemia. Last lipid panels in April 2020 report continued elevated LDL at 135.  This visit was conducted via telephone due to national recommendations for restrictions regarding the COVID-19 Pandemic in efforts to limit in office visits. Spoke with patient on 09/12/18 to discuss cholesterol management. Patient reports compliance with rosuvastatin 40 mg daily and Zetia 10 mg daily and reports using a weekly pill box that helps him remember to take medications daily. Patient denies experiencing any side effects or muscle weakness on current cholesterol therapy and also reports that he does not recall having any side effects or muscle pains on statin therapy he has been on in the past (atorvastatin and pravastatin).  Current Medications: rosuvastatin 40 mg daily, Zetia 10 mg daily  Intolerances: atorvastatin and pravastatin documented, however, patient does not recall having any side effects on any of his statin medications.  Risk Factors: clinical ASCVD (CAD s/p STEMI with DES placed)  LDL goal: <70  Diet: Patient is a Art gallery manager and states he has been watching his diet by limiting his sodium intake, limiting trans and saturated fats, and reducing the amount of starch in his diet. For a typical breakfast, patient reports having oatmeal, yogurt, and a couple of eggs. Patient report lunch normally just consists of a salad (chicken caesar salad, mixed greens, cranberries, and boiled eggs). Will also occasionally make homemade soup with fresh vegetables or chicken. For dinner, patient reports having a chicken salad, crab cake, seafood, and a lot of vegetables. Patient states he avoids  shrimp because of the high cholesterol content.   Exercise: Patient reports walking up and down stairs 8x times per day and is walking about 2 miles every day. Patient states he was going to cardiac rehab but has since stopped since the Coronavirus pandemic.   Family History: The patient's family history includes CAD in his father; Cancer in his father; Diabetes in his mother; Heart disease in his father and maternal aunt; Heart failure in his father; Hyperlipidemia in his mother; Hypertension in his father and mother; Mesothelioma in his father; Sudden death in his father. There is no history of Prostate cancer or Colon cancer.  Social History: Former smoker, occasional alcohol use, denies illicit drug use  Labs: Lipid panel 07/28/2018: TC 262, TG 151, HDL 97, LDL 135 (rosuvastatin 40 mg daily, Zetia 10 mg daily)  Lipid panel 06/08/2018: TC 292, TG 39, HDL 110, LDL 174 (rosuvastatin 40 mg daily, Zetia 10 mg daily)  Lipid panel 07/23/2017: TC 253, TG 99, HDL 67, LDL 166 (rosuvastatin 40 mg daily, Zetia 10 mg daily)   Past Medical History:  Diagnosis Date  . BPH (benign prostatic hyperplasia)   . CAD (coronary artery disease)    a. NSTEMI troponin >65 with cath 11/2016 S/p PTCA & DES to mid circumflex; irregularies in LAD & RCA, LVEDP 24, EF 50% and 45-50% by echo.  . Chronic chest pain   . Chronic combined systolic and diastolic CHF (congestive heart failure) ([place])  a. EF 45% in 2018. b. EF 55-60% by echo 01/2017. c. EF 48% by nuc 07/2017.  . CKD (chronic kidney disease), stage II   . Cluster headache    hx of  . Complication of anesthesia    difficult waking up"  . Diverticulosis   . Drug abuse ([place])    hx of  . GERD (gastroesophageal reflux disease)   . Hiatal hernia   . Hyperlipidemia   . Hypertension   . Hypothyroidism   . Nontoxic multinodular goiter   . NSTEMI (non-ST elevated myocardial infarction) ([place])   . Perforated nasal septum   . Pulmonary nodule --- CT 10/19/2014: Nodule  is stable, no further routine x-rays 01/06/2009  . TIA (transient ischemic attack)   . Trigeminal neuralgia   . Tubular adenoma of colon 12/2015  . Unspecified asthma(493.90)     Current Outpatient Medications on File Prior to Visit  Medication Sig Dispense Refill  . amLODipine (NORVASC) 10 MG tablet Take 1 tablet (10 mg total) by mouth daily. 30 tablet 3  . aspirin EC 81 MG tablet Take 81 mg by mouth daily.    . carvedilol (COREG) 25 MG tablet Take 1 tablet (25 mg total) by mouth 2 (two) times daily. 180 tablet 3  . clopidogrel (PLAVIX) 75 MG tablet Take 75 mg by mouth daily.    . colchicine 0.6 MG tablet Take 1 tablet (0.6 mg total) by mouth 2 (two) times daily. Take for 3 months only. 180 tablet 0  . ezetimibe (ZETIA) 10 MG tablet Take 1 tablet (10 mg total) by mouth daily. 90 tablet 1  . FLUoxetine (PROZAC) 20 MG tablet Take 20 mg by mouth daily.    . fluticasone (FLONASE) 50 MCG/ACT nasal spray Place 2 sprays into both nostrils daily. 16 g 2  . lansoprazole (PREVACID) 30 MG capsule Take 1 capsule (30 mg total) by mouth 2 (two) times daily before a meal. 180 capsule 1  . levocetirizine (XYZAL) 5 MG tablet Take 1 tablet (5 mg total) by mouth every evening. 30 tablet 2  . losartan (COZAAR) 100 MG tablet Take 100 mg by mouth daily.    . nitroGLYCERIN (NITROSTAT) 0.4 MG SL tablet Place 1 tablet (0.4 mg total) under the tongue every 5 (five) minutes as needed for chest pain. 25 tablet 5  . Polyethyl Glycol-Propyl Glycol (SYSTANE OP) Place 1 drop into both eyes every 3 (three) hours as needed (dry eyes).     . promethazine (PHENERGAN) 12.5 MG tablet Take 12.5 mg by mouth every 6 (six) hours as needed for nausea or vomiting.    . ranolazine (RANEXA) 500 MG 12 hr tablet Take 1 tablet (500 mg total) by mouth 2 (two) times daily. 60 tablet 1  . rosuvastatin (CRESTOR) 40 MG tablet Take 1 tablet (40 mg total) by mouth at bedtime. 30 tablet 1   No current facility-administered medications on file  prior to visit.     Allergies  Allergen Reactions  . Tramadol Palpitations    Assessment/Plan:  1. Hyperlipidemia - Patient was seen by lipid clinic for cholesterol management. Patient continues to have elevated LDL levels at 135, which is above his goal of <70 (with history of clinical ASCVD). Instructed patient to continue taking rosuvastatin 40 mg daily and Zetia 10 mg daily. Also spoke to patient about the benefits of adding a PCSK9 inhibitor to therapy as it would assist in further LDL reductions. Patient is agreeable to starting Repatha and states he does not  mind using injectable therapy as his is already using Imitrex. Repatha prior authorization has been sent and approved through 02/2020. Repatha prescription has been sent to pharmacy and $5 copay card has been activated and information given to pharmacy. Patient has been educated on use of Repatha injection and will start using on every other Friday starting 09/13/2018. Next lipid panel has also been scheduled for August 3rd - patient instructed to stop eating/drinking at 8 pm the night before.   Gwenlyn Found, Sherian Rein D PGY1 Pharmacy Resident  Phone [phone] 09/12/2018   12:40 PM

## 2018-09-12 NOTE — Addendum Note (Signed)
Addended by: Arby Barrette on: 09/12/2018 03:46 PM   Modules accepted: Orders

## 2018-09-13 ENCOUNTER — Other Ambulatory Visit: Payer: Self-pay

## 2018-09-13 ENCOUNTER — Telehealth: Payer: Self-pay

## 2018-09-13 DIAGNOSIS — I214 Non-ST elevation (NSTEMI) myocardial infarction: Secondary | ICD-10-CM

## 2018-09-13 DIAGNOSIS — E785 Hyperlipidemia, unspecified: Secondary | ICD-10-CM

## 2018-09-13 DIAGNOSIS — Z95820 Peripheral vascular angioplasty status with implants and grafts: Secondary | ICD-10-CM

## 2018-09-13 DIAGNOSIS — Z955 Presence of coronary angioplasty implant and graft: Secondary | ICD-10-CM

## 2018-09-13 DIAGNOSIS — I2510 Atherosclerotic heart disease of native coronary artery without angina pectoris: Secondary | ICD-10-CM

## 2018-09-13 MED ORDER — COLCHICINE 0.6 MG PO CAPS: 0.6000 mg | ORAL_CAPSULE | Freq: Two times a day (BID) | ORAL | 2 refills | Status: DC

## 2018-09-13 NOTE — Telephone Encounter (Signed)
**Note De-Identified  Obfuscation** We received a Colchicine PA request from [place]. I changed the RX to [facility] and then contacted Mickel Baas at [facility] who advised me that the [employer]  did go through and that the pt picked up on Wednesday 09/11/2018.

## 2018-09-25 ENCOUNTER — Other Ambulatory Visit: Payer: Self-pay

## 2018-09-25 ENCOUNTER — Ambulatory Visit (INDEPENDENT_AMBULATORY_CARE_PROVIDER_SITE_OTHER): Payer: 59 | Admitting: Family Medicine

## 2018-09-25 ENCOUNTER — Encounter: Payer: Self-pay | Admitting: Family Medicine

## 2018-09-25 ENCOUNTER — Ambulatory Visit: Payer: Self-pay

## 2018-09-25 DIAGNOSIS — G4700 Insomnia, unspecified: Secondary | ICD-10-CM | POA: Diagnosis not present

## 2018-09-25 DIAGNOSIS — T887XXA Unspecified adverse effect of drug or medicament, initial encounter: Secondary | ICD-10-CM

## 2018-09-25 MED ORDER — TRAZODONE HCL 50 MG PO TABS: 25.0000 mg | ORAL_TABLET | Freq: Every evening | ORAL | 3 refills | Status: DC | PRN

## 2018-09-25 NOTE — Telephone Encounter (Signed)
Patient called and says he's been feeling tired since yesterday. He says yesterday he passed out, woke up on the floor. He checked his BP and it was 88/64, then went up to as high as 93/64. Today his blood pressure ranged from 104/73-110/64, with the most recent 30 minutes before calling 106/72, P 86. He says he doesn't know if he took too many of his BP medication or not. He denies any other symptoms. I called the office and spoke to Elnita Maxwell, [facility] who asked to speak to the patient, the call was connected successfully.  Answer Assessment - Initial Assessment Questions 1. BLOOD PRESSURE: "What is the blood pressure?" "Did you take at least two measurements 5 minutes apart?"     88/64 after passing out; today 106/72 30 mins ago 2. ONSET: "When did you take your blood pressure?"     Yesterday and today 3. HOW: "How did you obtain the blood pressure?" (e.g., visiting nurse, automatic home BP monitor)     Automatic home BP monitor 4. HISTORY: "Do you have a history of low blood pressure?" "What is your blood pressure normally?"     Not normally low 5. MEDICATIONS: "Are you taking any medications for blood pressure?" If yes: "Have they been changed recently?"     Yes 6. PULSE RATE: "Do you know what your pulse rate is?"      86 7. OTHER SYMPTOMS: "Have you been sick recently?" "Have you had a recent injury?"     No 8. PREGNANCY: "Is there any chance you are pregnant?" "When was your last menstrual period?"     N/A  Protocols used: LOW BLOOD PRESSURE-A-AH

## 2018-09-25 NOTE — Progress Notes (Signed)
CC: passed out  Subjective: Patient is a 58 y.o. male here for low BP. Due to COVID-19 pandemic, we are interacting via phone. I verified patient's ID using 2 identifiers. Patient agreed to proceed with visit via this method. Patient is at home, I am at office. Patient and I are present for visit.   Pt w hx of HTN, taking Norvasc 10 mg/d, Coreg 25 mg bid, and losartan 100 mg/d. Reports compliance, yesterday may have taken too much. BP was in 80's-90's over 40-50's. He remembers going to bed and woke up on the floor. Did not take Coreg last night, BP's in 110's/70's. Feels better today. No CP or SOB.  Many years of insomnia. Has been on Xanax and Ambien in past, did not like. Goes to sleep for 4-5 hrs and wakes up, unable to fall back asleep. Very tired during day.   ROS: Heart: Denies chest pain  Lungs: Denies SOB   Past Medical History:  Diagnosis Date  . BPH (benign prostatic hyperplasia)   . CAD (coronary artery disease)    a. NSTEMI troponin >65 with cath 11/2016 S/p PTCA & DES to mid circumflex; irregularies in LAD & RCA, LVEDP 24, EF 50% and 45-50% by echo.  . Chronic chest pain   . Chronic combined systolic and diastolic CHF (congestive heart failure) ([place])    a. EF 45% in 2018. b. EF 55-60% by echo 01/2017. c. EF 48% by nuc 07/2017.  . CKD (chronic kidney disease), stage II   . Cluster headache    hx of  . Complication of anesthesia    difficult waking up"  . Diverticulosis   . Drug abuse ([place])    hx of  . GERD (gastroesophageal reflux disease)   . Hiatal hernia   . Hyperlipidemia   . Hypertension   . Hypothyroidism   . Nontoxic multinodular goiter   . NSTEMI (non-ST elevated myocardial infarction) ([place])   . Perforated nasal septum   . Pulmonary nodule --- CT 10/19/2014: Nodule is stable, no further routine x-rays 01/06/2009  . TIA (transient ischemic attack)   . Trigeminal neuralgia   . Tubular adenoma of colon 12/2015  . Unspecified asthma(493.90)     Objective: No  conversational dyspnea Age appropriate judgment and insight Nml affect and mood  Assessment and Plan: Adverse drug event  Insomnia, unspecified type - Plan: traZODone (DESYREL) 50 MG tablet  1- Cut Coreg in half. Be very careful when setting out meds in pill box. Monitor BP's, let us know if anything changes. 2- Start trazodone. Sleep hygiene provided. [facility] # for CBT provided.  Total time spent: 21 min F/u in 4 weeks to reck insomnia.  The patient voiced understanding and agreement to the plan.  [place], DO 09/25/18  4:42 PM

## 2018-09-26 ENCOUNTER — Ambulatory Visit (INDEPENDENT_AMBULATORY_CARE_PROVIDER_SITE_OTHER): Payer: 59 | Admitting: Family Medicine

## 2018-09-26 ENCOUNTER — Encounter: Payer: Self-pay | Admitting: Family Medicine

## 2018-09-26 ENCOUNTER — Encounter (HOSPITAL_BASED_OUTPATIENT_CLINIC_OR_DEPARTMENT_OTHER): Payer: Self-pay | Admitting: Emergency Medicine

## 2018-09-26 ENCOUNTER — Emergency Department (HOSPITAL_BASED_OUTPATIENT_CLINIC_OR_DEPARTMENT_OTHER)
Admission: EM | Admit: 2018-09-26 | Discharge: 2018-09-26 | Disposition: A | Discharge status: Home / Self Care | Payer: 59 | Attending: Emergency Medicine | Admitting: Emergency Medicine

## 2018-09-26 ENCOUNTER — Other Ambulatory Visit: Payer: Self-pay

## 2018-09-26 ENCOUNTER — Emergency Department (HOSPITAL_BASED_OUTPATIENT_CLINIC_OR_DEPARTMENT_OTHER): Payer: 59

## 2018-09-26 VITALS — BP 102/70 | HR 96 | Temp 97.9°F | Resp 16 | Ht 70.5 in | Wt 236.0 lb

## 2018-09-26 DIAGNOSIS — R55 Syncope and collapse: Secondary | ICD-10-CM | POA: Diagnosis present

## 2018-09-26 DIAGNOSIS — N179 Acute kidney failure, unspecified: Secondary | ICD-10-CM

## 2018-09-26 DIAGNOSIS — R42 Dizziness and giddiness: Secondary | ICD-10-CM

## 2018-09-26 DIAGNOSIS — R531 Weakness: Secondary | ICD-10-CM

## 2018-09-26 DIAGNOSIS — I959 Hypotension, unspecified: Secondary | ICD-10-CM

## 2018-09-26 LAB — COMPREHENSIVE METABOLIC PANEL
ALT: 65 U/L — ABNORMAL HIGH (ref 0–44)
AST: 73 U/L — ABNORMAL HIGH (ref 15–41)
Albumin: 4.2 g/dL (ref 3.5–5.0)
Alkaline Phosphatase: 45 U/L (ref 38–126)
Anion gap: 17 — ABNORMAL HIGH (ref 5–15)
BUN: 25 mg/dL — ABNORMAL HIGH (ref 6–20)
CO2: 19 mmol/L — ABNORMAL LOW (ref 22–32)
Calcium: 9 mg/dL (ref 8.9–10.3)
Chloride: 97 mmol/L — ABNORMAL LOW (ref 98–111)
Creatinine, Ser: 2.18 mg/dL — ABNORMAL HIGH (ref 0.61–1.24)
GFR calc Af Amer: 38 mL/min — ABNORMAL LOW (ref >60)
GFR calc non Af Amer: 33 mL/min — ABNORMAL LOW (ref >60)
Glucose, Bld: 109 mg/dL — ABNORMAL HIGH (ref 70–99)
Potassium: 3.8 mmol/L (ref 3.5–5.1)
Sodium: 133 mmol/L — ABNORMAL LOW (ref 135–145)
Total Bilirubin: 1.1 mg/dL (ref 0.3–1.2)
Total Protein: 8 g/dL (ref 6.5–8.1)

## 2018-09-26 LAB — CBC WITH DIFFERENTIAL/PLATELET
Abs Immature Granulocytes: 0.02 10*3/uL (ref 0.00–0.07)
Basophils Absolute: 0 10*3/uL (ref 0.0–0.1)
Basophils Relative: 1 %
Eosinophils Absolute: 0 10*3/uL (ref 0.0–0.5)
Eosinophils Relative: 1 %
HCT: 42.3 % (ref 39.0–52.0)
Hemoglobin: 14.6 g/dL (ref 13.0–17.0)
Immature Granulocytes: 0 %
Lymphocytes Relative: 41 %
Lymphs Abs: 2.3 10*3/uL (ref 0.7–4.0)
MCH: 34 pg (ref 26.0–34.0)
MCHC: 34.5 g/dL (ref 30.0–36.0)
MCV: 98.4 fL (ref 80.0–100.0)
Monocytes Absolute: 1 10*3/uL (ref 0.1–1.0)
Monocytes Relative: 18 %
Neutro Abs: 2.2 10*3/uL (ref 1.7–7.7)
Neutrophils Relative %: 39 %
Platelets: 148 10*3/uL — ABNORMAL LOW (ref 150–400)
RBC: 4.3 MIL/uL (ref 4.22–5.81)
RDW: 14.6 % (ref 11.5–15.5)
WBC: 5.6 10*3/uL (ref 4.0–10.5)
nRBC: 0 % (ref 0.0–0.2)

## 2018-09-26 LAB — TROPONIN I: Troponin I: <0.03 ng/mL (ref <0.03)

## 2018-09-26 MED ORDER — SODIUM CHLORIDE 0.9 % IV BOLUS
1000.0000 mL | Freq: Once | INTRAVENOUS | Status: AC
  Administered 2018-09-26: 1000 mL via INTRAVENOUS

## 2018-09-26 NOTE — ED Notes (Signed)
Pt on monitor 

## 2018-09-26 NOTE — ED Triage Notes (Addendum)
Pt reports he woke up on the floor Tues; denies injuries; has been intermittently dizzy since; was told to stop carvedilol; pt stopped the medication Tues pm, but took a dose this morning

## 2018-09-26 NOTE — Progress Notes (Signed)
[place] at [facility] [address], [place], [place] [address] [phone]  Date:  09/26/2018   Name:  [name]   DOB:  1960-05-28   MRN:  [id number]  PCP:  [name], DO    Chief Complaint: Hypotension (weakness, after eating lunch, dizziness,passed out Tuesday, started beginning of the week)   History of Present Illness:  [name] is a 58 y.o. very pleasant male patient who presents with the following:  Pt of Dr. Nani Ravens who I have not seen in the past -brought in today due to recent syncopal episode, hypotension and malaise He has history of CAD/N STEMI treated with a stent in 2018, repeat stent in February 2020. Most recently admitted to the hospital from 3/31 to 4/2-with unstable angina.  He had another cardiac cath at that time, but did not have PCI  Today is Thursday. On Tuesday his BP was ok in the am, but after eating lunch he was not feeling well- he felt dizzy and lightheaded.  He stood up to go and lie down- then he apparently fainted, woke up on the floor.  He was home alone so this was not witnessed He totally lost consciousness-however he is not sure how long he was out. He did not urinate on himself and did not get hurt Did not hit his head  He came to, went to his bed and checked his BP- it was no higher than 90/60s for the rest of that day He thought that maybe he took an extra BP pill by mistake but was not really sure.  This am his BP was 122/82, then 125/90- he went ahead and took his BP meds:  amlodipine 10, coreg, losartan However he was just not feeling well today.  He cannot really put his finger on it, but feels kind of dizzy and weak.  He denies any chest pain or pressure, but does admit to some shortness of breath with activity.  He thinks this is about at baseline  No orthopnea or lower extremity edema  Wt Readings from Last 3 Encounters:  09/26/18 236 lb (107 kg)   09/26/18 236 lb (107 kg)  09/11/18 232 lb (105.2 kg)    He is on repatha for his cholesterol, as well as Crestor, Zetia. Also amlodipine 10, aspirin 81, carvedilol, Plavix, colchicine for pericarditis, losartan Trazodone, Prozac  He is a Biomedical scientist at a [employer]  Patient Active Problem List   Diagnosis Date Noted  . Lactic acidosis 07/23/2018  . Status post coronary artery stent placement   . History of coronary artery disease   . Unstable angina ([place]) 06/08/2018  . S/P angioplasty with stent   . Chronic heart failure with preserved ejection fraction (HFpEF) ([place])   . Situational depression 06/07/2018  . Palpitations 05/28/2018  . Chronic cough 05/27/2018  . Gross hematuria 05/27/2018  . Chest pain 07/22/2017  . Ischemic cardiomyopathy 02/05/2017  . Obesity 12/08/2016  . Chronic diastolic CHF (congestive heart failure) ([place]) 12/08/2016  . Atypical chest pain 12/07/2016  . NSTEMI (non-ST elevated myocardial infarction) ([place]) 11/26/2016  . Intractable nausea and vomiting 10/09/2016  . Nausea vomiting and diarrhea 10/09/2016  . Enteritis due to Clostridium difficile 10/30/2015  . Malnutrition of moderate degree 10/21/2015  . Acute kidney injury ([place]) 10/20/2015  . PCP NOTES >>>>>>>>>>>>>>>>> 09/30/2015  . Hypercholesteremia 09/22/2015  . Renal mass, left 11/02/2014  . CAD (coronary artery disease)   . Drug  abuse and dependence ([place]) 09/18/2014  . Visit for preventive health examination 02/04/2014  . Screening for ischemic heart disease 02/04/2014  . BPH associated with nocturia 12/24/2013  . Hypothyroidism 12/24/2013  . Chronic ethmoidal sinusitis 12/24/2013  . Low back pain radiating to right leg 05/10/2012  . Thrombocytopenia ([place]) 12/05/2011  . Tobacco use 11/30/2011  . Anxiety 12/26/2010  . Transient cerebral ischemia 01/07/2010  . Asthma 01/19/2009  . Pulmonary nodule --- CT 10/19/2014: Nodule is stable, no further routine x-rays 01/06/2009  . Nontoxic  multinodular goiter 12/31/2008  . DYSPNEA ON EXERTION 12/15/2008  . Trigeminal neuralgia 07/24/2007  . Dyslipidemia 12/27/2006  . Cluster headache 12/27/2006  . Essential hypertension 12/27/2006  . GERD 12/27/2006    Past Medical History:  Diagnosis Date  . BPH (benign prostatic hyperplasia)   . CAD (coronary artery disease)    a. NSTEMI troponin >65 with cath 11/2016 S/p PTCA & DES to mid circumflex; irregularies in LAD & RCA, LVEDP 24, EF 50% and 45-50% by echo.  . Chronic chest pain   . Chronic combined systolic and diastolic CHF (congestive heart failure) ([place])    a. EF 45% in 2018. b. EF 55-60% by echo 01/2017. c. EF 48% by nuc 07/2017.  . CKD (chronic kidney disease), stage II   . Cluster headache    hx of  . Complication of anesthesia    difficult waking up"  . Diverticulosis   . Drug abuse ([place])    hx of  . GERD (gastroesophageal reflux disease)   . Hiatal hernia   . Hyperlipidemia   . Hypertension   . Hypothyroidism   . Nontoxic multinodular goiter   . NSTEMI (non-ST elevated myocardial infarction) ([place])   . Perforated nasal septum   . Pulmonary nodule --- CT 10/19/2014: Nodule is stable, no further routine x-rays 01/06/2009  . TIA (transient ischemic attack)   . Trigeminal neuralgia   . Tubular adenoma of colon 12/2015  . Unspecified asthma(493.90)     Past Surgical History:  Procedure Laterality Date  . APPENDECTOMY    . CARDIAC CATHETERIZATION N/A 11/25/2014   Procedure: Left Heart Cath and Coronary Angiography;  Surgeon: Belva Crome, MD;  Location: [facility];  Service: Cardiovascular;  Laterality: N/A;  . CARDIAC CATHETERIZATION  12/08/2008   Archie Endo 08/24/2010  . COLECTOMY  ~ 1976   "I had a blockage"  . CORONARY STENT INTERVENTION N/A 11/27/2016   Procedure: CORONARY STENT INTERVENTION;  Surgeon: Belva Crome, MD;  Location: [facility];  Service: Cardiovascular;  Laterality: N/A;  . CORONARY STENT INTERVENTION N/A 06/10/2018   Procedure:  CORONARY STENT INTERVENTION;  Surgeon: Nelva Bush, MD;  Location: [facility];  Service: Cardiovascular;  Laterality: N/A;  . CORONARY STENT PLACEMENT  11/27/2016   STENT XIENCE ALPINE RX 3.0X15 drug eluting stent was successfully placed  . CYST EXCISION Left 10/31/2016   Jaw  . DESTRUCTION TRIGEMINAL NERVE VIA NEUROLYTIC AGENT  x 3 , R side last ~2010  . KNEE ARTHROSCOPY Right 06/26/2000   Arthroscopy followed by open lateral release/notes 09/06/2010  . LEFT HEART CATH AND CORONARY ANGIOGRAPHY N/A 11/27/2016   Procedure: LEFT HEART CATH AND CORONARY ANGIOGRAPHY;  Surgeon: Belva Crome, MD;  Location: [facility];  Service: Cardiovascular;  Laterality: N/A;  . LEFT HEART CATH AND CORONARY ANGIOGRAPHY N/A 12/07/2016   Procedure: LEFT HEART CATH AND CORONARY ANGIOGRAPHY;  Surgeon: Nelva Bush, MD;  Location: [facility];  Service: Cardiovascular;  Laterality: N/A;  . LEFT HEART CATH AND CORONARY ANGIOGRAPHY N/A 06/10/2018   Procedure: LEFT HEART CATH AND CORONARY ANGIOGRAPHY;  Surgeon: Nelva Bush, MD;  Location: [facility];  Service: Cardiovascular;  Laterality: N/A;  . LEFT HEART CATH AND CORONARY ANGIOGRAPHY N/A 07/24/2018   Procedure: LEFT HEART CATH AND CORONARY ANGIOGRAPHY;  Surgeon: Belva Crome, MD;  Location: [facility];  Service: Cardiovascular;  Laterality: N/A;  . 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 Ardine Bjork 09/06/2010  . SHOULDER HEMI-ARTHROPLASTY Right 05/26/2010   revision/notes 05/27/2010    Social History   Tobacco Use  . Smoking status: Former Smoker    Packs/day: 0.25    Years: 12.00    Pack years: 3.00    Types: Cigarettes    Last attempt to quit: 11/15/2016    Years since quitting: 1.8  . Smokeless tobacco: Never Used  Substance Use Topics  . Alcohol use: Yes    Comment: occ  . Drug use: No    Family History  Problem Relation Age of Onset  . Diabetes Mother   . Hyperlipidemia Mother    . Hypertension Mother   . Hypertension Father   . Heart disease Father   . Cancer Father   . Sudden death Father        OCT 07-20-09  . CAD Father   . Heart failure Father   . Mesothelioma Father   . Heart disease Maternal Aunt   . Prostate cancer Neg Hx   . Colon cancer Neg Hx     Allergies  Allergen Reactions  . Tramadol Palpitations    Medication list has been reviewed and updated.  Current Outpatient Medications on File Prior to Visit  Medication Sig Dispense Refill  . amLODipine (NORVASC) 10 MG tablet Take 1 tablet (10 mg total) by mouth daily. 30 tablet 3  . aspirin EC 81 MG tablet Take 81 mg by mouth daily.    . carvedilol (COREG) 25 MG tablet Take 1 tablet (25 mg total) by mouth 2 (two) times daily. 180 tablet 3  . clopidogrel (PLAVIX) 75 MG tablet Take 75 mg by mouth daily.    . Colchicine (MITIGARE) 0.6 MG CAPS Take 0.6 mg by mouth 2 (two) times a day. 60 capsule 2  . Evolocumab (REPATHA SURECLICK) 701 MG/ML SOAJ Inject 140 mg into the skin every 14 (fourteen) days. 2 pen 11  . ezetimibe (ZETIA) 10 MG tablet Take 1 tablet (10 mg total) by mouth daily. 90 tablet 1  . FLUoxetine (PROZAC) 20 MG tablet Take 20 mg by mouth daily.    . fluticasone (FLONASE) 50 MCG/ACT nasal spray Place 2 sprays into both nostrils daily. 16 g 2  . lansoprazole (PREVACID) 30 MG capsule Take 1 capsule (30 mg total) by mouth 2 (two) times daily before a meal. 180 capsule 1  . levocetirizine (XYZAL) 5 MG tablet Take 1 tablet (5 mg total) by mouth every evening. 30 tablet 2  . losartan (COZAAR) 100 MG tablet Take 100 mg by mouth daily.    . nitroGLYCERIN (NITROSTAT) 0.4 MG SL tablet Place 1 tablet (0.4 mg total) under the tongue every 5 (five) minutes as needed for chest pain. 25 tablet 5  . Polyethyl Glycol-Propyl Glycol (SYSTANE OP) Place 1 drop into both eyes every 3 (three) hours as needed (dry eyes).     . promethazine (PHENERGAN) 12.5 MG tablet Take 12.5 mg by mouth every 6 (six) hours as  needed for nausea or vomiting.    . ranolazine (RANEXA) 500 MG 12 hr tablet Take 1 tablet (500 mg total) by mouth 2 (two) times daily. 60 tablet 1  . rosuvastatin (CRESTOR) 40 MG tablet Take 1 tablet (40 mg total) by mouth at bedtime. 30 tablet 1  . traZODone (DESYREL) 50 MG tablet Take 0.5-1 tablets (25-50 mg total) by mouth at bedtime as needed for sleep. 30 tablet 3   No current facility-administered medications on file prior to visit.     Review of Systems:  As per HPI- otherwise negative.   Physical Examination: Vitals:   09/26/18 1458  BP: 102/70  Pulse: 96  Resp: 16  Temp: 97.9 F (36.6 C)  SpO2: 94%   Vitals:   09/26/18 1458  Weight: 236 lb (107 kg)  Height: 5' 10.5" (1.791 m)   Body mass index is 33.38 kg/m. Ideal Body Weight: Weight in (lb) to have BMI = 25: 176.4  GEN: WDWN, NAD, Non-toxic, A & O x 3, overweight, does not appear to feel well HEENT: Atraumatic, Normocephalic. Neck supple. No masses, No LAD. Ears and Nose: No external deformity. CV: RRR, No M/G/R. No JVD. No thrill. No extra heart sounds. PULM: CTA B, no wheezes, crackles, rhonchi. No retractions. No resp. distress. No accessory muscle use. ABD: S, NT, ND. No rebound. No HSM. EXTR: No c/c/e NEURO Normal gait.  PSYCH: Normally interactive. Conversant. Not depressed or anxious appearing.  Calm demeanor.   EKG: Sinus rhythm with nonspecific T wave changes . Compared with tracing from April, EKG appears more normal Assessment and Plan: Dizziness - Plan: EKG 12-Lead  Syncope, unspecified syncope type  Patient with history of end STEMI and CAD, status post stent in February of this year.  He had a syncopal episode and hypotension 2 days ago.  He continues to feel nonspecifically ill, and he does not look very well. I am concerned that there may be undiagnosed acute pathology, would like patient to go the emergency room.  He is in agreement.  Is escorted to the ER by staff  Signed Lamar Blinks, MD

## 2018-09-26 NOTE — ED Provider Notes (Signed)
[facility] EMERGENCY DEPARTMENT Provider Note   CSN: [id number] Arrival date & time: 09/26/18  1540    History   Chief Complaint Chief Complaint  Patient presents with  . Near Syncope    HPI [name] is a 58 y.o. male.     Patient is a 58 year old male of coronary artery disease with stent, most recently in April of this year, chronic renal insufficiency, hypertension, hyperlipidemia.  He presents today for evaluation of weakness.  He reports having a syncopal episode 2 days ago while at home.  He was checking his blood pressures and they were low at the time.  He had a follow-up appointment with his doctor today who wanted him to come to the ER for further testing.  The patient denies to me he is having any chest pain, difficulty breathing, headache, or other symptoms.  His appetite is been normal and has been stooling and voiding normally.  The history is provided by the patient.  Near Syncope  This is a new problem. The current episode started 2 days ago. The problem has not changed since onset.Nothing aggravates the symptoms. Nothing relieves the symptoms. He has tried nothing for the symptoms.    Past Medical History:  Diagnosis Date  . BPH (benign prostatic hyperplasia)   . CAD (coronary artery disease)    a. NSTEMI troponin >65 with cath 11/2016 S/p PTCA & DES to mid circumflex; irregularies in LAD & RCA, LVEDP 24, EF 50% and 45-50% by echo.  . Chronic chest pain   . Chronic combined systolic and diastolic CHF (congestive heart failure) ([place])    a. EF 45% in 2018. b. EF 55-60% by echo 01/2017. c. EF 48% by nuc 07/2017.  . CKD (chronic kidney disease), stage II   . Cluster headache    hx of  . Complication of anesthesia    difficult waking up"  . Diverticulosis   . Drug abuse ([place])    hx of  . GERD (gastroesophageal reflux disease)   . Hiatal hernia   . Hyperlipidemia   . Hypertension   . Hypothyroidism   . Nontoxic multinodular goiter   . NSTEMI  (non-ST elevated myocardial infarction) ([place])   . Perforated nasal septum   . Pulmonary nodule --- CT 10/19/2014: Nodule is stable, no further routine x-rays 01/06/2009  . TIA (transient ischemic attack)   . Trigeminal neuralgia   . Tubular adenoma of colon 12/2015  . Unspecified asthma(493.90)     Patient Active Problem List   Diagnosis Date Noted  . Lactic acidosis 07/23/2018  . Status post coronary artery stent placement   . History of coronary artery disease   . Unstable angina ([place]) 06/08/2018  . S/P angioplasty with stent   . Chronic heart failure with preserved ejection fraction (HFpEF) ([place])   . Situational depression 06/07/2018  . Palpitations 05/28/2018  . Chronic cough 05/27/2018  . Gross hematuria 05/27/2018  . Chest pain 07/22/2017  . Ischemic cardiomyopathy 02/05/2017  . Obesity 12/08/2016  . Chronic diastolic CHF (congestive heart failure) ([place]) 12/08/2016  . Atypical chest pain 12/07/2016  . NSTEMI (non-ST elevated myocardial infarction) ([place]) 11/26/2016  . Intractable nausea and vomiting 10/09/2016  . Nausea vomiting and diarrhea 10/09/2016  . Enteritis due to Clostridium difficile 10/30/2015  . Malnutrition of moderate degree 10/21/2015  . Acute kidney injury ([place]) 10/20/2015  . PCP NOTES >>>>>>>>>>>>>>>>> 09/30/2015  . Hypercholesteremia 09/22/2015  . Renal mass, left 11/02/2014  . CAD (coronary artery disease)   .  Drug abuse and dependence ([place]) 09/18/2014  . Visit for preventive health examination 02/04/2014  . Screening for ischemic heart disease 02/04/2014  . BPH associated with nocturia 12/24/2013  . Hypothyroidism 12/24/2013  . Chronic ethmoidal sinusitis 12/24/2013  . Low back pain radiating to right leg 05/10/2012  . Thrombocytopenia ([place]) 12/05/2011  . Tobacco use 11/30/2011  . Anxiety 12/26/2010  . Transient cerebral ischemia 01/07/2010  . Asthma 01/19/2009  . Pulmonary nodule --- CT 10/19/2014: Nodule is stable, no further routine x-rays  01/06/2009  . Nontoxic multinodular goiter 12/31/2008  . DYSPNEA ON EXERTION 12/15/2008  . Trigeminal neuralgia 07/24/2007  . Dyslipidemia 12/27/2006  . Cluster headache 12/27/2006  . Essential hypertension 12/27/2006  . GERD 12/27/2006    Past Surgical History:  Procedure Laterality Date  . APPENDECTOMY    . CARDIAC CATHETERIZATION N/A 11/25/2014   Procedure: Left Heart Cath and Coronary Angiography;  Surgeon: Belva Crome, MD;  Location: [facility];  Service: Cardiovascular;  Laterality: N/A;  . CARDIAC CATHETERIZATION  12/08/2008   Archie Endo 08/24/2010  . COLECTOMY  ~ 1976   "I had a blockage"  . CORONARY STENT INTERVENTION N/A 11/27/2016   Procedure: CORONARY STENT INTERVENTION;  Surgeon: Belva Crome, MD;  Location: [facility];  Service: Cardiovascular;  Laterality: N/A;  . CORONARY STENT INTERVENTION N/A 06/10/2018   Procedure: CORONARY STENT INTERVENTION;  Surgeon: Nelva Bush, MD;  Location: [facility];  Service: Cardiovascular;  Laterality: N/A;  . CORONARY STENT PLACEMENT  11/27/2016   STENT XIENCE ALPINE RX 3.0X15 drug eluting stent was successfully placed  . CYST EXCISION Left 10/31/2016   Jaw  . DESTRUCTION TRIGEMINAL NERVE VIA NEUROLYTIC AGENT  x 3 , R side last ~2010  . KNEE ARTHROSCOPY Right 06/26/2000   Arthroscopy followed by open lateral release/notes 09/06/2010  . LEFT HEART CATH AND CORONARY ANGIOGRAPHY N/A 11/27/2016   Procedure: LEFT HEART CATH AND CORONARY ANGIOGRAPHY;  Surgeon: Belva Crome, MD;  Location: [facility];  Service: Cardiovascular;  Laterality: N/A;  . LEFT HEART CATH AND CORONARY ANGIOGRAPHY N/A 12/07/2016   Procedure: LEFT HEART CATH AND CORONARY ANGIOGRAPHY;  Surgeon: Nelva Bush, MD;  Location: [facility];  Service: Cardiovascular;  Laterality: N/A;  . LEFT HEART CATH AND CORONARY ANGIOGRAPHY N/A 06/10/2018   Procedure: LEFT HEART CATH AND CORONARY ANGIOGRAPHY;  Surgeon: Nelva Bush, MD;   Location: [facility];  Service: Cardiovascular;  Laterality: N/A;  . LEFT HEART CATH AND CORONARY ANGIOGRAPHY N/A 07/24/2018   Procedure: LEFT HEART CATH AND CORONARY ANGIOGRAPHY;  Surgeon: Belva Crome, MD;  Location: [facility];  Service: Cardiovascular;  Laterality: N/A;  . 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 Ardine Bjork 09/06/2010  . SHOULDER HEMI-ARTHROPLASTY Right 05/26/2010   revision/notes 05/27/2010        Home Medications    Prior to Admission medications   Medication Sig Start Date End Date Taking? Authorizing Provider  amLODipine (NORVASC) 10 MG tablet Take 1 tablet (10 mg total) by mouth daily. 06/07/18   [name], DO  aspirin EC 81 MG tablet Take 81 mg by mouth daily.    [provider]  carvedilol (COREG) 25 MG tablet Take 1 tablet (25 mg total) by mouth 2 (two) times daily. 06/26/18   Imogene Burn, PA-C  clopidogrel (PLAVIX) 75 MG tablet Take 75 mg by mouth daily.    [provider]  Colchicine (MITIGARE) 0.6 MG  CAPS Take 0.6 mg by mouth 2 (two) times a day. 09/13/18   Dorothy Spark, MD  Evolocumab (REPATHA SURECLICK) 518 MG/ML SOAJ Inject 140 mg into the skin every 14 (fourteen) days. 09/12/18   Dorothy Spark, MD  ezetimibe (ZETIA) 10 MG tablet Take 1 tablet (10 mg total) by mouth daily. 05/27/18   [name], DO  FLUoxetine (PROZAC) 20 MG tablet Take 20 mg by mouth daily.    [provider]  fluticasone (FLONASE) 50 MCG/ACT nasal spray Place 2 sprays into both nostrils daily. 05/27/18   [name], DO  lansoprazole (PREVACID) 30 MG capsule Take 1 capsule (30 mg total) by mouth 2 (two) times daily before a meal. 05/27/18   Wendling, Crosby Oyster, DO  levocetirizine (XYZAL) 5 MG tablet Take 1 tablet (5 mg total) by mouth every evening. 05/27/18   [name], DO  losartan (COZAAR) 100 MG tablet Take 100 mg by mouth daily.    [provider]   nitroGLYCERIN (NITROSTAT) 0.4 MG SL tablet Place 1 tablet (0.4 mg total) under the tongue every 5 (five) minutes as needed for chest pain. 05/28/18   Imogene Burn, PA-C  Polyethyl Glycol-Propyl Glycol (SYSTANE OP) Place 1 drop into both eyes every 3 (three) hours as needed (dry eyes).     [provider]  promethazine (PHENERGAN) 12.5 MG tablet Take 12.5 mg by mouth every 6 (six) hours as needed for nausea or vomiting.    [provider]  ranolazine (RANEXA) 500 MG 12 hr tablet Take 1 tablet (500 mg total) by mouth 2 (two) times daily. 06/11/18   Cheryln Manly, NP  rosuvastatin (CRESTOR) 40 MG tablet Take 1 tablet (40 mg total) by mouth at bedtime. 06/11/18   Cheryln Manly, NP  traZODone (DESYREL) 50 MG tablet Take 0.5-1 tablets (25-50 mg total) by mouth at bedtime as needed for sleep. 09/25/18   [name], DO    Family History Family History  Problem Relation Age of Onset  . Diabetes Mother   . Hyperlipidemia Mother   . Hypertension Mother   . Hypertension Father   . Heart disease Father   . Cancer Father   . Sudden death Father        OCT 07-20-09  . CAD Father   . Heart failure Father   . Mesothelioma Father   . Heart disease Maternal Aunt   . Prostate cancer Neg Hx   . Colon cancer Neg Hx     Social History Social History   Tobacco Use  . Smoking status: Former Smoker    Packs/day: 0.25    Years: 12.00    Pack years: 3.00    Types: Cigarettes    Last attempt to quit: 11/15/2016    Years since quitting: 1.8  . Smokeless tobacco: Never Used  Substance Use Topics  . Alcohol use: Yes    Comment: occ  . Drug use: No     Allergies   Tramadol   Review of Systems Review of Systems  Cardiovascular: Positive for near-syncope.  All other systems reviewed and are negative.    Physical Exam Updated Vital Signs BP 106/63   Pulse 74   Temp 98 F (36.7 C) (Oral)   Resp 17   Ht 5\' 10"  (1.778 m)   Wt 107 kg   SpO2 96%   BMI  33.86 kg/m   Physical Exam Vitals signs and nursing note reviewed.  Constitutional:  General: He is not in acute distress.    Appearance: He is well-developed. He is not diaphoretic.  HENT:     Head: Normocephalic and atraumatic.     Mouth/Throat:     Mouth: Mucous membranes are moist.  Eyes:     Extraocular Movements: Extraocular movements intact.     Pupils: Pupils are equal, round, and reactive to light.  Neck:     Musculoskeletal: Normal range of motion and neck supple. No neck rigidity or muscular tenderness.  Cardiovascular:     Rate and Rhythm: Normal rate and regular rhythm.     Heart sounds: No murmur. No friction rub.  Pulmonary:     Effort: Pulmonary effort is normal. No respiratory distress.     Breath sounds: Normal breath sounds. No wheezing or rales.  Abdominal:     General: Bowel sounds are normal. There is no distension.     Palpations: Abdomen is soft.     Tenderness: There is no abdominal tenderness.  Musculoskeletal: Normal range of motion.  Skin:    General: Skin is warm and dry.  Neurological:     General: No focal deficit present.     Mental Status: He is alert and oriented to person, place, and time.     Cranial Nerves: No cranial nerve deficit.     Motor: No weakness.     Coordination: Coordination normal.      ED Treatments / Results  Labs (all labs ordered are listed, but only abnormal results are displayed) Labs Reviewed  CBC WITH DIFFERENTIAL/PLATELET - Abnormal; Notable for the following components:      Result Value   Platelets 148 (*)    All other components within normal limits  COMPREHENSIVE METABOLIC PANEL - Abnormal; Notable for the following components:   Sodium 133 (*)    Chloride 97 (*)    CO2 19 (*)    Glucose, Bld 109 (*)    BUN 25 (*)    Creatinine, Ser 2.18 (*)    AST 73 (*)    ALT 65 (*)    GFR calc non Af Amer 33 (*)    GFR calc Af Amer 38 (*)    Anion gap 17 (*)    All other components within normal limits   TROPONIN I    EKG EKG Interpretation  Date/Time:  Thursday September 26 2018 16:12:35 EDT Ventricular Rate:  81 PR Interval:    QRS Duration: 96 QT Interval:  446 QTC Calculation: 518 R Axis:   87 Text Interpretation:  Sinus rhythm Borderline T abnormalities, lateral leads Minimal ST elevation, inferior leads Prolonged QT interval Baseline wander in lead(s) II III aVF Confirmed by Veryl Speak [phone]) on 09/26/2018 6:18:29 PM   Radiology No results found.  Procedures Procedures (including critical care time)  Medications Ordered in ED Medications - No data to display   Initial Impression / Assessment and Plan / ED Course  I have reviewed the triage vital signs and the nursing notes.  Pertinent labs & imaging results that were available during my care of the patient were reviewed by me and considered in my medical decision making (see chart for details).  Patient referred here by primary doctor for evaluation of low blood pressure and general weakness.  The patient's work-up shows a slight bump in his creatinine.  Today it is 2.2 which is increased from his baseline.  Patient is mildly hypotensive here in the ER, however this responded well to IV fluids.  He  is now normotensive and feeling much better.  Remainder of work-up shows no abnormality of his head CT and unchanged EKG.  Patient's primary doctor recommended cutting his carvedilol dose in half and I am in agreement with this.  Patient was also advised to consume extra fluids for the next 2 days.  He is to follow-up next week with his primary doctor for a recheck of his creatinine.  Final Clinical Impressions(s) / ED Diagnoses   Final diagnoses:  None    ED Discharge Orders    None       Veryl Speak, MD 09/26/18 2012

## 2018-09-26 NOTE — Patient Instructions (Signed)
Please proceed to the ER downstairs for further evaluation.  I hope that you feel much better soon!

## 2018-09-26 NOTE — ED Notes (Signed)
Dr. Delo at bedside. 

## 2018-09-26 NOTE — Telephone Encounter (Signed)
Called and scheduled with DOD today Dr. Lorelei Pont --- IN person confirmed no COVID symptoms Will send this msg. To covering MD to see before seeing the pat

## 2018-09-26 NOTE — Discharge Instructions (Signed)
Cut your carvedilol dose in half until followed up by your primary doctor.  Increase your fluid intake by two 8 ounce glasses of water per day for the next several days.  You should follow-up with your primary doctor next week for a recheck of your creatinine, and return to the emergency department if you develop worsening fatigue, chest pain, difficulty breathing, high fever, or other new and concerning symptoms.

## 2018-09-27 ENCOUNTER — Ambulatory Visit: Payer: Self-pay

## 2018-09-27 NOTE — Telephone Encounter (Signed)
Incoming call from Patient who complains  Of his blood pressure. He take 3  Blood pressure medications. Reports after he take the three  His blood Pressures it starts to drop.   Patient reports that he feels week.  Patient reports that he is going call back on Monday and make an appointment  Answer Assessment - Initial Assessment Questions 1. BLOOD PRESSURE: "What is the blood pressure?" "Did you take at least two measurements 5 minutes apart?"      2. ONSET: "When did you take your blood pressure?"     yesterday 3. HOW: "How did you obtain the blood pressure?" (e.g., visiting nurse, automatic home BP monitor)     *No Answer* 4. HISTORY: "Do you have a history of low blood pressure?" "What is your blood pressure normally?"     No on three different high blood medication 5. MEDICATIONS: "Are you taking any medications for blood pressure?" If yes: "Have they been changed recently?"      6. PULSE RATE: "Do you know what your pulse rate is?"      75 7. OTHER SYMPTOMS: "Have you been sick recently?" "Have you had a recent injury?"     Low energy   Feeling weak.   8. PREGNANCY: "Is there any chance you are pregnant?" "When was your last menstrual period?"     *na  Protocols used: LOW BLOOD PRESSURE-A-AH

## 2018-10-01 ENCOUNTER — Telehealth: Payer: Self-pay | Admitting: Cardiology

## 2018-10-01 NOTE — Telephone Encounter (Signed)
Pt called to report that on 6/2 he is not sure but he may have passed out and when he woke up he had a low BP 68/56. On 6/3 he had a televisit with his PCP Dr. Nani Ravens and he had him cut the Coreg in half to 12.5 BID. On 6/4 he wasn't feeling well so they brought him into the office and says his BP was low but he was not sure what it was but they sent him to the ED because he did not look well. He says after being treated with fluids they had him stop his Coreg.   On 6/4 in the ED his BUN was 25, Creatinin 2.18, AST 73, and ALT 65.   Since he has been home his BP is now running 127/87, 138/98, 122/86, and heart rate staying aorund 68-70. He is asking if he can stay off of the Coreg because he feels it was making him feel bad all along. I advised him I would forward to Dr. Meda Coffee for her review.  I have also asked him to follow up with Dr. Nani Ravens so he can have his lab work followed up on, he says he is waiting on them to call him since they were already addressing that issue. He denies being sick prior to all of this that could have attributed to his Creat being elevated and he says no.    I have also advised him to continue to monitor his BP and he has agreed.

## 2018-10-01 NOTE — Telephone Encounter (Signed)
Patient states he went his PCP for low BP and they sent him to the Emergency room, they told him it was probably due to his medication they advised him to stop taking one medication and advised him to call his cardiologist.   First they told him to cut carvedilol (COREG) 25 MG tablet in half if he conutine to half low BP to stop taking it and call us. Which he is still having low BP. He wants to know what he should do now.

## 2018-10-01 NOTE — Telephone Encounter (Signed)
Its ok to hold carvedilol

## 2018-10-02 ENCOUNTER — Other Ambulatory Visit: Payer: Self-pay

## 2018-10-02 ENCOUNTER — Telehealth: Payer: Self-pay | Admitting: Family Medicine

## 2018-10-02 NOTE — Telephone Encounter (Signed)
OK to sched ER f/u w me.

## 2018-10-02 NOTE — Telephone Encounter (Signed)
Copied from [place] [id number]. Topic: General - Other >> Oct 01, 2018  5:08 PM [name], Jeffrey wrote: Reason for CRM: Pt stated he was advised that his kidney function was high and he was advised by both the emergency dept and the cardiologist to follow up with pcp. Pt requests call back. Cb# [phone]  Called left detailed message to call and schedule ED/F/U appt with PCP

## 2018-10-02 NOTE — Telephone Encounter (Signed)
Pt advised... pt will continue to monitor BP and will be sure to follow up on labs with Dr. Nani Ravens.

## 2018-10-08 NOTE — Telephone Encounter (Signed)
appt scheduled

## 2018-10-09 ENCOUNTER — Ambulatory Visit (INDEPENDENT_AMBULATORY_CARE_PROVIDER_SITE_OTHER): Payer: 59 | Admitting: Family Medicine

## 2018-10-09 ENCOUNTER — Other Ambulatory Visit: Payer: Self-pay

## 2018-10-09 ENCOUNTER — Other Ambulatory Visit: Payer: Self-pay | Admitting: Family Medicine

## 2018-10-09 ENCOUNTER — Encounter: Payer: Self-pay | Admitting: Family Medicine

## 2018-10-09 VITALS — BP 124/86 | HR 81 | Temp 97.7°F | Ht 70.0 in | Wt 240.0 lb

## 2018-10-09 DIAGNOSIS — G4700 Insomnia, unspecified: Secondary | ICD-10-CM | POA: Diagnosis not present

## 2018-10-09 DIAGNOSIS — N179 Acute kidney failure, unspecified: Secondary | ICD-10-CM | POA: Diagnosis not present

## 2018-10-09 DIAGNOSIS — I10 Essential (primary) hypertension: Secondary | ICD-10-CM

## 2018-10-09 DIAGNOSIS — T887XXA Unspecified adverse effect of drug or medicament, initial encounter: Secondary | ICD-10-CM

## 2018-10-09 LAB — BASIC METABOLIC PANEL
BUN: 16 mg/dL (ref 6–23)
CO2: 22 mEq/L (ref 19–32)
Calcium: 8.9 mg/dL (ref 8.4–10.5)
Chloride: 97 mEq/L (ref 96–112)
Creatinine, Ser: 1.27 mg/dL (ref 0.40–1.50)
GFR: 71.1 mL/min (ref >60.00)
Glucose, Bld: 81 mg/dL (ref 70–99)
Potassium: 3.8 mEq/L (ref 3.5–5.1)
Sodium: 135 mEq/L (ref 135–145)

## 2018-10-09 MED ORDER — METOPROLOL SUCCINATE ER 50 MG PO TB24: 50.0000 mg | ORAL_TABLET | Freq: Every day | ORAL | 3 refills | Status: DC

## 2018-10-09 NOTE — Progress Notes (Signed)
Chief Complaint  Patient presents with  . Follow-up    Subjective: Patient is a 58 y.o. male here for f/u.  The patient was having issues with hypotension. Cards took him off of h is carvedilol and he has been feeling much better.  Home blood pressures have been 120-160s/70s-100s.  He has been compliant with losartan and amlodipine. Diet fair, little exercise recently. No Cp or SOB. Went to ED where he received fluids and labs, AKI evident.   Still having insomnia, trazodone not helpful. Did not get set up with therapy. Wakes up and has difficulty falling back asleep.   ROS: Heart: Denies chest pain  Lungs: Denies SOB   Past Medical History:  Diagnosis Date  . BPH (benign prostatic hyperplasia)   . CAD (coronary artery disease)    a. NSTEMI troponin >65 with cath 11/2016 S/p PTCA & DES to mid circumflex; irregularies in LAD & RCA, LVEDP 24, EF 50% and 45-50% by echo.  . Chronic chest pain   . Chronic combined systolic and diastolic CHF (congestive heart failure) ([place])    a. EF 45% in 2018. b. EF 55-60% by echo 01/2017. c. EF 48% by nuc 07/2017.  . CKD (chronic kidney disease), stage II   . Cluster headache    hx of  . Complication of anesthesia    difficult waking up"  . Diverticulosis   . Drug abuse ([place])    hx of  . GERD (gastroesophageal reflux disease)   . Hiatal hernia   . Hyperlipidemia   . Hypertension   . Hypothyroidism   . Nontoxic multinodular goiter   . NSTEMI (non-ST elevated myocardial infarction) ([place])   . Perforated nasal septum   . Pulmonary nodule --- CT 10/19/2014: Nodule is stable, no further routine x-rays 01/06/2009  . TIA (transient ischemic attack)   . Trigeminal neuralgia   . Tubular adenoma of colon 12/2015  . Unspecified asthma(493.90)    Objective: BP 124/86 (BP Location: Left Arm, Patient Position: Sitting, Cuff Size: Normal)   Pulse 81   Temp 97.7 F (36.5 C) (Oral)   Ht 5\' 10"  (1.778 m)   Wt 240 lb (108.9 kg)   SpO2 99%   BMI 34.44  kg/m  General: Awake, appears stated age HEENT: MMM, EOMi Heart: RRR, no murmurs Lungs: CTAB, no rales, wheezes or rhonchi. No accessory muscle use Psych: Age appropriate judgment and insight, normal affect and mood  Assessment and Plan: AKI (acute kidney injury) ([place]) - Plan: Basic metabolic panel, reck, may need to hold ARB for a few days depending on results.   Adverse drug event - Plan: hold off on carvedilol per cards rec, may add back metoprolol.   Essential hypertension - Plan: Ck labs, will add metoprolol if WNL  Insomnia, unspecified type - Plan: increase dose to 100 mg trazodone nightly. [facility] # given again.   The patient voiced understanding and agreement to the plan.  [place], DO 10/09/18  12:42 PM

## 2018-10-09 NOTE — Patient Instructions (Addendum)
Give [place] 2-3 business days to get the results of your labs back.   Stay hydrated.  Keep the diet clean and stay active.  Sleep is important to [place] all. Getting good sleep is imperative to adequate functioning during the day. Work with our counselors who are trained to help people obtain quality sleep. Call [phone] to schedule an appointment or if you are curious about insurance coverage/cost.  Take 2 tabs of your trazodone and let me know how you do.   Let us know if you need anything.

## 2018-10-10 ENCOUNTER — Telehealth: Payer: Self-pay | Admitting: *Deleted

## 2018-10-10 NOTE — Telephone Encounter (Signed)
Virtual Visit Pre-Appointment Phone Call  "(Name), I am calling you today to discuss your upcoming appointment. We are currently trying to limit exposure to the virus that causes COVID-19 by seeing patients at home rather than in the office."  1. "What is the BEST phone number to call the day of the visit?" - include this in appointment notes  2. "Do you have or have access to (through a family member/friend) a smartphone with video capability that we can use for your visit?" a. If yes - list this number in appt notes as "cell" (if different from BEST phone #) and list the appointment type as a VIDEO visit in appointment notes b. If no - list the appointment type as a PHONE visit in appointment notes  3. Confirm consent - "In the setting of the current Covid19 crisis, you are scheduled for a (phone or video) visit with your provider on (date) at (time).  Just as we do with many in-office visits, in order for you to participate in this visit, we must obtain consent.  If you'd like, I can send this to your mychart (if signed up) or email for you to review.  Otherwise, I can obtain your verbal consent now.  All virtual visits are billed to your insurance company just like a normal visit would be.  By agreeing to a virtual visit, we'd like you to understand that the technology does not allow for your provider to perform an examination, and thus may limit your provider's ability to fully assess your condition. If your provider identifies any concerns that need to be evaluated in person, we will make arrangements to do so.  Finally, though the technology is pretty good, we cannot assure that it will always work on either your or our end, and in the setting of a video visit, we may have to convert it to a phone-only visit.  In either situation, we cannot ensure that we have a secure connection.  Are you willing to proceed?" STAFF: Did the patient verbally acknowledge consent to telehealth visit? Document  YES/NO here: YES  4. Advise patient to be prepared - "Two hours prior to your appointment, go ahead and check your blood pressure, pulse, oxygen saturation, and your weight (if you have the equipment to check those) and write them all down. When your visit starts, your provider will ask you for this information. If you have an Apple Watch or Kardia device, please plan to have heart rate information ready on the day of your appointment. Please have a pen and paper handy nearby the day of the visit as well."  5. Give patient instructions for MyChart download to smartphone OR Doximity/Doxy.me as below if video visit (depending on what platform provider is using)  6. Inform patient they will receive a phone call 15 minutes prior to their appointment time (may be from unknown caller ID) so they should be prepared to answer    TELEPHONE CALL NOTE  [name] has been deemed a candidate for a follow-up tele-health visit to limit community exposure during the Covid-19 pandemic. I spoke with the patient via phone to ensure availability of phone/video source, confirm preferred email & phone number, and discuss instructions and expectations.  I reminded [name] to be prepared with any vital sign and/or heart rhythm information that could potentially be obtained via home monitoring, at the time of his visit. I reminded [name] to expect a phone call prior to his visit.  [name], [place] 10/10/2018 9:50 AM   INSTRUCTIONS FOR DOWNLOADING THE MYCHART APP TO SMARTPHONE  - The patient must first make sure to have activated MyChart and know their login information - If Apple, go to [employer] and type in MyChart in the search bar and download the app. If Android, ask patient to go to [employer] and type in [place] in the search bar and download the app. The app is free but as with any other app downloads, their phone may require them to verify saved payment information or  Apple/Android password.  - The patient will need to then log into the app with their MyChart username and password, and select [facility] as their healthcare provider to link the account. When it is time for your visit, go to the MyChart app, find appointments, and click Begin Video Visit. Be sure to Select Allow for your device to access the Microphone and Camera for your visit. You will then be connected, and your provider will be with you shortly.  **If they have any issues connecting, or need assistance please contact MyChart service desk [phone])**  **If using a computer, in order to ensure the best quality for their visit they will need to use either of the following Internet Browsers: [employer], or Google Chrome**  IF USING DOXIMITY or DOXY.ME - The patient will receive a link just prior to their visit by text.     FULL LENGTH CONSENT FOR TELE-HEALTH VISIT   I hereby voluntarily request, consent and authorize [facility] and its employed or contracted physicians, physician assistants, nurse practitioners or other licensed health care professionals (the Practitioner), to provide me with telemedicine health care services (the "Services") as deemed necessary by the treating Practitioner. I acknowledge and consent to receive the Services by the Practitioner via telemedicine. I understand that the telemedicine visit will involve communicating with the Practitioner through live audiovisual communication technology and the disclosure of certain medical information by electronic transmission. I acknowledge that I have been given the opportunity to request an in-person assessment or other available alternative prior to the telemedicine visit and am voluntarily participating in the telemedicine visit.  I understand that I have the right to withhold or withdraw my consent to the use of telemedicine in the course of my care at any time, without affecting my right to future care  or treatment, and that the Practitioner or I may terminate the telemedicine visit at any time. I understand that I have the right to inspect all information obtained and/or recorded in the course of the telemedicine visit and may receive copies of available information for a reasonable fee.  I understand that some of the potential risks of receiving the Services via telemedicine include:  Marland Kitchen Delay or interruption in medical evaluation due to technological equipment failure or disruption; . Information transmitted may not be sufficient (e.g. poor resolution of images) to allow for appropriate medical decision making by the Practitioner; and/or  . In rare instances, security protocols could fail, causing a breach of personal health information.  Furthermore, I acknowledge that it is my responsibility to provide information about my medical history, conditions and care that is complete and accurate to the best of my ability. I acknowledge that Practitioner's advice, recommendations, and/or decision may be based on factors not within their control, such as incomplete or inaccurate data provided by me or distortions of diagnostic images or specimens that may result from electronic transmissions. I understand that the  practice of medicine is not an Chief Strategy Officer and that Practitioner makes no warranties or guarantees regarding treatment outcomes. I acknowledge that I will receive a copy of this consent concurrently upon execution via email to the email address I last provided but may also request a printed copy by calling the office of [facility].    I understand that my insurance will be billed for this visit.   I have read or had this consent read to me. . I understand the contents of this consent, which adequately explains the benefits and risks of the Services being provided via telemedicine.  . I have been provided ample opportunity to ask questions regarding this consent and the Services and have had  my questions answered to my satisfaction. . I give my informed consent for the services to be provided through the use of telemedicine in my medical care  By participating in this telemedicine visit I agree to the above.

## 2018-10-14 ENCOUNTER — Encounter: Payer: Self-pay | Admitting: Physician Assistant

## 2018-10-14 NOTE — Progress Notes (Deleted)
{Choose 1 Note Type (Telehealth Visit or Telephone Visit):[phone]}   Date:  10/14/2018   ID:  [name], DOB November 01, 1960, MRN [id number]  Patient Location: Home Provider Location: Home  PCP:  [name], DO  Cardiologist:  Ena Dawley, MD  Electrophysiologist:  None   Evaluation Performed:  Follow-Up Visit  Chief Complaint:  F/u syncope  History of Present Illness:    [name] is a 58 y.o. male with CAD (MI s/p DES to Cx 11/2016), chronic combined CHF with normalized LVEF, HTN, HLD, tobacco abuse, GERD, chronic nausea, BPH, cluster headache, h/o drug abuse, thyroid goiter, hypothyroidism, TIA, trigeminal neuralgia, CKD III, asthma, thrombocytopenia and abnormal LFTs by labs who presents for f/u syncope.  He was remotely cathed in 2010 with nonobstructive disease. He was admitted 11/2016 with MI and troponin >65. Cath showed total occlusion of mid Cx s/p PTCA/DES. There were irregularities are noted in the LAD and right coronary but no significant obstructive lesions seen. EF was 45-50%. He did have frequent atypical pain post-cath which was managed medically, felt possibly musculoskeletal. There was also previous concern for drug seeking behavior. He underwent relook cath for recurrent pain with stable anatomy. Colchicine was initially added for component of inflammation but he was unable to afford this. In 05/2018 he had an abnormal stress test and underwent repeat cath resulting in DES to [facility] with recommendation for medical therapy of 70% OM2. 2D echo 05/2018 showed EF 60-65%, normal RV. Event monitor 05/2018 showed on arrhythmias. He was readmitted 07/2018 with chest pain and underwent cath without acute findings (30% prox LAD, 70% distal Cx felt to be stable, patent RCA, EF 50% with normal filling pressures). UDS negative. He saw Dr. Meda Coffee in telemed f/u with again repeated chest pain and she recommended to add colchicine. Ibuprofen was not added as he was on  DAPT. In early June 2020 he had an episode of syncope with hypotension in the setting of possibly having taken an extra BP pill earlier that morning. He had had some low readings. He was sent to the ER and found to have AKI with Cr of 2.18, hyponatremia, elevated LFTs, negative troponin. His carvedilol was stopped although continued on ARB. Primary care rechecked BMET 10/09/18 and it had normalized back to baseline with Cr 1.27, K 3.8. His PCP started him on metoprolol. Last labs otherwise 07/2018 showed LDL 135, Hgb 13.6, plt 109.  The patient {does/does not:200015} have symptoms concerning for COVID-19 infection (fever, chills, cough, or new shortness of breath).   30 day monitor repatha started 08/2018 aug 3rd F/u PCP lfts, tcp  Syncope associated with hypotension and AKI CAD Chronic chest pain Hyperlipidemia    Past Medical History:  Diagnosis Date  . BPH (benign prostatic hyperplasia)   . CAD (coronary artery disease)    a. NSTEMI troponin >65 with cath 11/2016 S/p PTCA & DES to mid circumflex. b. 05/2018 s/p DES to [facility].  Marland Kitchen Chronic chest pain   . Chronic combined systolic and diastolic CHF (congestive heart failure) ([place])    a. EF 45% in 2018. b. EF 55-60% by echo 01/2017. c. EF 48% by nuc 07/2017.  . CKD (chronic kidney disease), stage II   . Cluster headache    hx of  . Complication of anesthesia    difficult waking up"  . Diverticulosis   . Drug abuse ([place])    hx of  . GERD (gastroesophageal reflux disease)   . Hiatal hernia   . Hyperlipidemia   .  Hypertension   . Hypothyroidism   . Nontoxic multinodular goiter   . NSTEMI (non-ST elevated myocardial infarction) ([place])   . Perforated nasal septum   . Pulmonary nodule --- CT 10/19/2014: Nodule is stable, no further routine x-rays 01/06/2009  . Thrombocytopenia ([place])   . TIA (transient ischemic attack)   . Trigeminal neuralgia   . Tubular adenoma of colon 12/2015  . Unspecified asthma(493.90)    Past Surgical History:   Procedure Laterality Date  . APPENDECTOMY    . CARDIAC CATHETERIZATION N/A 11/25/2014   Procedure: Left Heart Cath and Coronary Angiography;  Surgeon: Belva Crome, MD;  Location: [facility];  Service: Cardiovascular;  Laterality: N/A;  . CARDIAC CATHETERIZATION  12/08/2008   Archie Endo 08/24/2010  . COLECTOMY  ~ 1976   "I had a blockage"  . CORONARY STENT INTERVENTION N/A 11/27/2016   Procedure: CORONARY STENT INTERVENTION;  Surgeon: Belva Crome, MD;  Location: [facility];  Service: Cardiovascular;  Laterality: N/A;  . CORONARY STENT INTERVENTION N/A 06/10/2018   Procedure: CORONARY STENT INTERVENTION;  Surgeon: Nelva Bush, MD;  Location: [facility];  Service: Cardiovascular;  Laterality: N/A;  . CORONARY STENT PLACEMENT  11/27/2016   STENT XIENCE ALPINE RX 3.0X15 drug eluting stent was successfully placed  . CYST EXCISION Left 10/31/2016   Jaw  . DESTRUCTION TRIGEMINAL NERVE VIA NEUROLYTIC AGENT  x 3 , R side last ~2010  . KNEE ARTHROSCOPY Right 06/26/2000   Arthroscopy followed by open lateral release/notes 09/06/2010  . LEFT HEART CATH AND CORONARY ANGIOGRAPHY N/A 11/27/2016   Procedure: LEFT HEART CATH AND CORONARY ANGIOGRAPHY;  Surgeon: Belva Crome, MD;  Location: [facility];  Service: Cardiovascular;  Laterality: N/A;  . LEFT HEART CATH AND CORONARY ANGIOGRAPHY N/A 12/07/2016   Procedure: LEFT HEART CATH AND CORONARY ANGIOGRAPHY;  Surgeon: Nelva Bush, MD;  Location: [facility];  Service: Cardiovascular;  Laterality: N/A;  . LEFT HEART CATH AND CORONARY ANGIOGRAPHY N/A 06/10/2018   Procedure: LEFT HEART CATH AND CORONARY ANGIOGRAPHY;  Surgeon: Nelva Bush, MD;  Location: [facility];  Service: Cardiovascular;  Laterality: N/A;  . LEFT HEART CATH AND CORONARY ANGIOGRAPHY N/A 07/24/2018   Procedure: LEFT HEART CATH AND CORONARY ANGIOGRAPHY;  Surgeon: Belva Crome, MD;  Location: [facility];  Service: Cardiovascular;   Laterality: N/A;  . 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 Ardine Bjork 09/06/2010  . SHOULDER HEMI-ARTHROPLASTY Right 05/26/2010   revision/notes 05/27/2010     No outpatient medications have been marked as taking for the 10/17/18 encounter (Appointment) with [name], PA-C.     Allergies:   Tramadol   Social History   Tobacco Use  . Smoking status: Former Smoker    Packs/day: 0.25    Years: 12.00    Pack years: 3.00    Types: Cigarettes    Quit date: 11/15/2016    Years since quitting: 1.9  . Smokeless tobacco: Never Used  Substance Use Topics  . Alcohol use: Yes    Comment: occ  . Drug use: No     Family Hx: The patient's family history includes CAD in his father; Cancer in his father; Diabetes in his mother; Heart disease in his father and maternal aunt; Heart failure in his father; Hyperlipidemia in his mother; Hypertension in his father and mother; Mesothelioma in his father; Sudden death in his father. There is no history of Prostate cancer or Colon cancer.  ROS:   Please see the history of present illness.    All other systems reviewed and are negative.   Prior CV studies:    Most recent pertinent cardiac studies are outlined above.  Labs/Other Tests and Data Reviewed:    EKG:  An ECG dated 09/26/18 was personally reviewed today and demonstrated:  81bpm, poor quality tracing due to baseline wander and artifact, nonspecific changes - QTc reported to be prolonged but tracing not sufficient to accurately estimate. Prior QTc 443ms.  Recent Labs: 07/26/2018: Magnesium 1.9; TSH 2.925 09/26/2018: ALT 65; Hemoglobin 14.6; Platelets 148 10/09/2018: BUN 16; Creatinine, Ser 1.27; Potassium 3.8; Sodium 135   Recent Lipid Panel Lab Results  Component Value Date/Time   CHOL 262 (H) 07/29/2018 03:26 AM   CHOL 245 (H) 05/29/2017 09:15 AM   TRIG 151 (H) 07/31/2018 03:26 AM   HDL 97 08/15/2018 03:26 AM   HDL 66 05/29/2017 09:15 AM   CHOLHDL 2.7 08/15/2018  03:26 AM   LDLCALC 135 (H) 07/24/2018 03:26 AM   LDLCALC 160 (H) 05/29/2017 09:15 AM   LDLDIRECT 209.8 07/20/2009 09:51 AM    Wt Readings from Last 3 Encounters:  10/09/18 240 lb (108.9 kg)  09/26/18 236 lb (107 kg)  09/26/18 236 lb (107 kg)     Objective:    Vital Signs:  There were no vitals taken for this visit.   VS reviewed. General - *** in no acute distress Pulm - No labored breathing, no coughing during visit, no audible wheezing, speaking in full sentences Neuro - A+Ox3, no slurred speech, answers questions appropriately Psych - Pleasant affect     ASSESSMENT & PLAN:    1. ***  COVID-19 Education: The signs and symptoms of COVID-19 were discussed with the patient and how to seek care for testing (follow up with PCP or arrange E-visit).  ***The importance of social distancing was discussed today.  Time:   Today, I have spent *** minutes with the patient with telehealth technology discussing the above problems.     Medication Adjustments/Labs and Tests Ordered: Current medicines are reviewed at length with the patient today.  Concerns regarding medicines are outlined above.   Disposition:  Follow up in ***  Signed, [name], PA-C  10/14/2018 10:36 AM    [facility]

## 2018-10-16 ENCOUNTER — Telehealth: Payer: Self-pay | Admitting: Physician Assistant

## 2018-10-16 NOTE — Telephone Encounter (Signed)
Returned pts call and voicemail is full and couldn't leave a message. Dayna isn't in the office on 10/17/2018, if pt wants a in-office visit, he will have to reschedule.

## 2018-10-16 NOTE — Telephone Encounter (Signed)
New Message      Spoke with the pt to confirm his appt. He says he would like to be seen in the office and not virtually. He says he thought it was suppose to be an in office visit     Please call

## 2018-10-16 NOTE — Telephone Encounter (Signed)
New Message     Patient called and will keep virtual appt.

## 2018-10-17 ENCOUNTER — Other Ambulatory Visit: Payer: Self-pay

## 2018-10-17 ENCOUNTER — Telehealth: Payer: Self-pay | Admitting: Radiology

## 2018-10-17 ENCOUNTER — Encounter: Payer: Self-pay | Admitting: Physician Assistant

## 2018-10-17 ENCOUNTER — Telehealth: Payer: Self-pay | Admitting: Physician Assistant

## 2018-10-17 ENCOUNTER — Telehealth (INDEPENDENT_AMBULATORY_CARE_PROVIDER_SITE_OTHER): Payer: 59 | Admitting: Physician Assistant

## 2018-10-17 VITALS — BP 135/96 | HR 78 | Ht 70.0 in | Wt 234.0 lb

## 2018-10-17 DIAGNOSIS — R55 Syncope and collapse: Secondary | ICD-10-CM | POA: Diagnosis not present

## 2018-10-17 DIAGNOSIS — I2510 Atherosclerotic heart disease of native coronary artery without angina pectoris: Secondary | ICD-10-CM

## 2018-10-17 DIAGNOSIS — R079 Chest pain, unspecified: Secondary | ICD-10-CM

## 2018-10-17 DIAGNOSIS — R0789 Other chest pain: Secondary | ICD-10-CM

## 2018-10-17 DIAGNOSIS — G8929 Other chronic pain: Secondary | ICD-10-CM

## 2018-10-17 DIAGNOSIS — E785 Hyperlipidemia, unspecified: Secondary | ICD-10-CM

## 2018-10-17 NOTE — Telephone Encounter (Signed)
   I wanted to add one thing to Mr. Bechler's instructions that we did not get to at his visit. For an episode of syncope, the [place] DMV typically advises a patient should not drive for 6 months following the event if unprovoked. I suspect his episode was due to low BP but would recommend avoiding driving until the monitor result comes back. This is due to the possibility that the event could recur and he be on the road. Dayna Dunn PA-C

## 2018-10-17 NOTE — Progress Notes (Signed)
Virtual Visit via Video Note   This visit type was conducted due to national recommendations for restrictions regarding the COVID-19 Pandemic (e.g. social distancing) in an effort to limit this patient's exposure and mitigate transmission in our community.  Due to his co-morbid illnesses, this patient is at least at moderate risk for complications without adequate follow up.  This format is felt to be most appropriate for this patient at this time.  All issues noted in this document were discussed and addressed.  A limited physical exam was performed with this format.  Please refer to the patient's chart for his consent to telehealth for [facility].   Date:  10/17/2018   ID:  [name], DOB 09/22/1960, MRN [id number]  Patient Location: Home Provider Location: Home  PCP:  [name], DO  Cardiologist:  Ena Dawley, MD  Electrophysiologist:  None   Evaluation Performed:  Follow-Up Visit  Chief Complaint:  F/u syncope  History of Present Illness:    [name] is a 58 y.o. male with CAD (MI s/p DES to Cx 11/2016), chronic combined CHF with normalized LVEF, HTN, HLD, tobacco abuse, GERD, chronic nausea, BPH, cluster headache, prior drug abuse, thyroid goiter, hypothyroidism, TIA, trigeminal neuralgia, CKD III, asthma, thrombocytopenia and abnormal LFTs by labs who presents for f/u syncope and chest pain.  He was remotely cathed in 2010 with nonobstructive disease. He was admitted 11/2016 with MI and troponin >65. Cath showed total occlusion of mid Cx s/p PTCA/DES. There were irregularities are noted in the LAD and right coronary but no significant obstructive lesions seen. EF was 45-50%. He has had chronic atypical pain post-cath. Initially this was felt possibly musculoskeletal. There was also previous concern for drug seeking behavior. He underwent relook cath for recurrent pain with stable anatomy. Colchicine was initially added for component of inflammation  but he was unable to afford this. In 05/2018 he had an abnormal stress test and underwent repeat cath resulting in DES to [facility] with recommendation for medical therapy of 70% OM2. 2D echo 05/2018 showed EF 60-65%, normal RV. 14 day monitor 05/2018 showed no arrhythmias. He was readmitted 07/2018 with chest pain and underwent cath without acute findings (30% prox LAD, 70% distal Cx felt to be stable, patent RCA, EF 50% with normal filling pressures). UDS was negative. He saw Dr. Meda Coffee in telemed f/u with again repeated chest pain and she recommended to add colchicine. Ibuprofen was not added as he was on DAPT. In early June 2020 he had an episode of syncope with hypotension in the setting of possibly having taken an extra BP pill earlier that morning. He had had some low readings. His BP was in the 80's that morning. He had stood up and suddenly fainted. He was home alone so it was unwitnessed. He didn't really have any acute preceding symptoms but just didn't feel well that morning. He was seen by PCP and was sent to the ER and found to have AKI with Cr of 2.18, hyponatremia, elevated LFTs, negative troponin. His carvedilol was stopped although continued on ARB. Primary care rechecked BMET 10/09/18 and it had normalized back to baseline with Cr 1.27, K 3.8. His PCP started him on metoprolol. Last labs otherwise 07/2018 showed LDL 135, Hgb 13.6, plt 109.  He returns for follow-up feeling "so-so as usual." He continues to have intermittent atypical chest pains which occur completely at random, not really pleuritic or positional or exertional in nature. They can last for varying periods of  time and resolve spontaneously. He has chronic DOE. He does not feel the colchicine nor the Ranexa ever really made a difference. Interestingly he says what helped him the most in the past was gabapentin, which he was weaned off of during hospitalization 07/2018 for unclear reasons. He does report continued intermittent palpitations. No  recurrent syncope. BP creeping up but has not picked up metoprolol yet. The patient does not have symptoms concerning for COVID-19 infection (fever, chills, cough, or new shortness of breath).    Past Medical History:  Diagnosis Date   BPH (benign prostatic hyperplasia)    CAD (coronary artery disease)    a. NSTEMI troponin >65 with cath 11/2016 S/p PTCA & DES to mid circumflex. b. 05/2018 s/p DES to [facility]   Chronic chest pain    Chronic combined systolic and diastolic CHF (congestive heart failure) ([place])    a. EF 45% in 2018. b. EF 55-60% by echo 01/2017. c. EF 48% by nuc 07/2017.   CKD (chronic kidney disease), stage II    Cluster headache    hx of   Complication of anesthesia    difficult waking up"   Diverticulosis    Drug abuse (HCC)    hx of   GERD (gastroesophageal reflux disease)    Hiatal hernia    Hyperlipidemia    Hypertension    Hypothyroidism    Nontoxic multinodular goiter    NSTEMI (non-ST elevated myocardial infarction) ([place])    Perforated nasal septum    Pulmonary nodule --- CT 10/19/2014: Nodule is stable, no further routine x-rays 01/06/2009   Thrombocytopenia (HCC)    TIA (transient ischemic attack)    Trigeminal neuralgia    Tubular adenoma of colon 12/2015   Unspecified asthma(493.90)    Past Surgical History:  Procedure Laterality Date   APPENDECTOMY     CARDIAC CATHETERIZATION N/A 11/25/2014   Procedure: Left Heart Cath and Coronary Angiography;  Surgeon: Belva Crome, MD;  Location: [place] [facility];  Service: Cardiovascular;  Laterality: N/A;   CARDIAC CATHETERIZATION  12/08/2008   Archie Endo 08/24/2010   COLECTOMY  ~ 1976   "I had a blockage"   CORONARY STENT INTERVENTION N/A 11/27/2016   Procedure: CORONARY STENT INTERVENTION;  Surgeon: Belva Crome, MD;  Location: [place] [facility];  Service: Cardiovascular;  Laterality: N/A;   CORONARY STENT INTERVENTION N/A 06/10/2018   Procedure: CORONARY STENT INTERVENTION;  Surgeon:  Nelva Bush, MD;  Location: [place] [facility];  Service: Cardiovascular;  Laterality: N/A;   CORONARY STENT PLACEMENT  11/27/2016   STENT XIENCE ALPINE RX 3.0X15 drug eluting stent was successfully placed   CYST EXCISION Left 10/31/2016   Jaw   DESTRUCTION TRIGEMINAL NERVE VIA NEUROLYTIC AGENT  x 3 , R side last ~2010   KNEE ARTHROSCOPY Right 06/26/2000   Arthroscopy followed by open lateral release/notes 09/06/2010   LEFT HEART CATH AND CORONARY ANGIOGRAPHY N/A 11/27/2016   Procedure: LEFT HEART CATH AND CORONARY ANGIOGRAPHY;  Surgeon: Belva Crome, MD;  Location: [place] [facility];  Service: Cardiovascular;  Laterality: N/A;   LEFT HEART CATH AND CORONARY ANGIOGRAPHY N/A 12/07/2016   Procedure: LEFT HEART CATH AND CORONARY ANGIOGRAPHY;  Surgeon: Nelva Bush, MD;  Location: [place] [facility];  Service: Cardiovascular;  Laterality: N/A;   LEFT HEART CATH AND CORONARY ANGIOGRAPHY N/A 06/10/2018   Procedure: LEFT HEART CATH AND CORONARY ANGIOGRAPHY;  Surgeon: Nelva Bush, MD;  Location: [place] [facility];  Service: Cardiovascular;  Laterality: N/A;  LEFT HEART CATH AND CORONARY ANGIOGRAPHY N/A 07/24/2018   Procedure: LEFT HEART CATH AND CORONARY ANGIOGRAPHY;  Surgeon: Belva Crome, MD;  Location: [place] [facility];  Service: Cardiovascular;  Laterality: N/A;   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 Ardine Bjork 09/06/2010   SHOULDER HEMI-ARTHROPLASTY Right 05/26/2010   revision/notes 05/27/2010     Current Meds  Medication Sig   amLODipine (NORVASC) 10 MG tablet Take 1 tablet (10 mg total) by mouth daily.   aspirin EC 81 MG tablet Take 81 mg by mouth daily.   clopidogrel (PLAVIX) 75 MG tablet Take 75 mg by mouth daily.   Evolocumab (REPATHA SURECLICK) 364 MG/ML SOAJ Inject 140 mg into the skin every 14 (fourteen) days.   ezetimibe (ZETIA) 10 MG tablet Take 1 tablet (10 mg total) by mouth daily.   FLUoxetine (PROZAC) 20 MG tablet Take 20 mg  by mouth daily.   fluticasone (FLONASE) 50 MCG/ACT nasal spray Place 2 sprays into both nostrils daily.   lansoprazole (PREVACID) 30 MG capsule Take 1 capsule (30 mg total) by mouth 2 (two) times daily before a meal.   levocetirizine (XYZAL) 5 MG tablet Take 1 tablet (5 mg total) by mouth every evening.   losartan (COZAAR) 100 MG tablet Take 100 mg by mouth daily.   metoprolol succinate (TOPROL-XL) 50 MG 24 hr tablet Take 1 tablet (50 mg total) by mouth daily. Take with or immediately following a meal.   nitroGLYCERIN (NITROSTAT) 0.4 MG SL tablet Place 1 tablet (0.4 mg total) under the tongue every 5 (five) minutes as needed for chest pain.   Polyethyl Glycol-Propyl Glycol (SYSTANE OP) Place 1 drop into both eyes every 3 (three) hours as needed (dry eyes).    promethazine (PHENERGAN) 12.5 MG tablet Take 12.5 mg by mouth every 6 (six) hours as needed for nausea or vomiting.   rosuvastatin (CRESTOR) 40 MG tablet Take 1 tablet (40 mg total) by mouth at bedtime.   traZODone (DESYREL) 50 MG tablet Take 0.5-1 tablets (25-50 mg total) by mouth at bedtime as needed for sleep.   [DISCONTINUED] Colchicine (MITIGARE) 0.6 MG CAPS Take 0.6 mg by mouth 2 (two) times a day.   [DISCONTINUED] ranolazine (RANEXA) 500 MG 12 hr tablet Take 1 tablet (500 mg total) by mouth 2 (two) times daily.     Allergies:   Tramadol   Social History   Tobacco Use   Smoking status: Former Smoker    Packs/day: 0.25    Years: 12.00    Pack years: 3.00    Types: Cigarettes    Quit date: 11/15/2016    Years since quitting: 1.9   Smokeless tobacco: Never Used  Substance Use Topics   Alcohol use: Yes    Comment: occ   Drug use: No     Family Hx: The patient's family history includes CAD in his father; Cancer in his father; Diabetes in his mother; Heart disease in his father and maternal aunt; Heart failure in his father; Hyperlipidemia in his mother; Hypertension in his father and mother; Mesothelioma in his  father; Sudden death in his father. There is no history of Prostate cancer or Colon cancer.  ROS:   Please see the history of present illness.    All other systems reviewed and are negative.   Prior CV studies:    Most recent pertinent cardiac studies are outlined above.  Labs/Other Tests and Data Reviewed:    EKG:  An ECG dated 09/26/18 was personally reviewed  today and demonstrated:  81bpm, poor quality tracing due to baseline wander and artifact, nonspecific changes - QTc reported to be prolonged but tracing not sufficient to accurately estimate. Prior QTc 473m.  Recent Labs: 08/03/2018: Magnesium 1.9; TSH 2.925 09/26/2018: ALT 65; Hemoglobin 14.6; Platelets 148 10/09/2018: BUN 16; Creatinine, Ser 1.27; Potassium 3.8; Sodium 135   Recent Lipid Panel Lab Results  Component Value Date/Time   CHOL 262 (H) 08/03/2018 03:26 AM   CHOL 245 (H) 05/29/2017 09:15 AM   TRIG 151 (H) 08/13/2018 03:26 AM   HDL 97 08/15/2018 03:26 AM   HDL 66 05/29/2017 09:15 AM   CHOLHDL 2.7 07/31/2018 03:26 AM   LDLCALC 135 (H) 08/13/2018 03:26 AM   LDLCALC 160 (H) 05/29/2017 09:15 AM   LDLDIRECT 209.8 07/20/2009 09:51 AM    Wt Readings from Last 3 Encounters:  10/17/18 234 lb (106.1 kg)  10/09/18 240 lb (108.9 kg)  09/26/18 236 lb (107 kg)     Objective:    Vital Signs:  BP (!) 135/96 Comment: bp as of 10/17/18   Pulse 78    Ht 5' 10"  (1.778 m)    Wt 234 lb (106.1 kg)    BMI 33.58 kg/m    VS reviewed. General - obese AAM in no acute distress HEENT - NCAT, EOM intact Pulm - No labored breathing, no coughing during visit, no audible wheezing, speaking in full sentences Neuro - A+Ox3, no slurred speech, answers questions appropriately Psych - Pleasant affect    ASSESSMENT & PLAN:    1. Syncope associated with hypotension and AKI - likely due to hypotension but not really sure why he had such profound AKI. He is not on any diuretics. Renal function has since been rechecked by PCP and was normal.  Agree with resumption of metoprolol. The patient will continue to follow BP and call if any abnormal parameters. Will arrange repeat 30 day monitor for completeness given syncope and intermittent palpitations. 2. CAD - continue present regimen. He will follow BP on metoprolol as above. 3. Chronic chest pain - remains atypical, cannot really be explained thus far from a cardiac standpoint. I have met Mr. Leever several times and each time he has always complained of chest pain, therefore I remain concerned there is an alternative etiology since cardiac interventions have not really helped him. Relook cath for this symptom in 07/2018 was unrevealing and volume status has been stable. Sed rate in 07/2017 was normal. The idea of pericarditis has been entertained several times. Would prefer to lay this issue to rest by performing cardiac MRI to evaluate for late enhancement. This will also help u[place] any infiltrative disease as well. I did encourage Mr. Oatis to touch base with primary care about considering re-trial of gabapentin since this is the only thing that has helped so far. This leads me to believe his pain may be neuropathic in nature. With his prior h/o drug abuse I question whether he could have a component of hyperalgesia manifesting similar to fibromyalgia, which would corroborate why gabapentin helped to some degree. It remains unclear to me why he was weaned off. The patient is concerned he is overmedicated. Since Mitigare and Ranexa have not provided any relief, we will stop these. There was question of QT prolongation in the past although EKG was confounded by baseline wander. This would be another reason to halt the Ranexa. Would suggest f/u EKG when he returns to the office for f/u.  4. Hyperlipidemia - recently started on  Repatha, has labs already arranged for August through lipid clinic to f/u. If he requires re-initiation of colchicine in the future for any reason, would be cautious to  also down-titrate statin at the same time given that they can increase statin concentration.  COVID-19 Education: The signs and symptoms of COVID-19 were discussed with the patient and how to seek care for testing (follow up with PCP or arrange E-visit).  The importance of social distancing was discussed today.  Time:   Today, I have spent 20 minutes with the patient with telehealth technology discussing the above problems.     Medication Adjustments/Labs and Tests Ordered: Current medicines are reviewed at length with the patient today.  Concerns regarding medicines are outlined above.   Disposition:  Follow up in 6 weeks in-person with Dr. Meda Coffee (per patient request, wants to see MD not APP since he has not seen her since 2018 aside from telehealth)  Signed, [name], PA-C  10/17/2018 10:45 AM    [place]

## 2018-10-17 NOTE — Telephone Encounter (Signed)
Pt returned my call and he has been made aware of the recommendations of not driving until his monitor results have came back due to the syncope episode. Pt also scheduled virtual appt with Dr. Meda Coffee, 7/24 at 10:40.

## 2018-10-17 NOTE — Telephone Encounter (Signed)
Called pt re: phone note below, left a message for pt to call back. Also sent message on mychart along with updated AVS.

## 2018-10-17 NOTE — Patient Instructions (Addendum)
Medication Instructions:  Your physician has recommended you make the following change in your medication:  1.  STOP the Colchicine 2.  STOP the Ranexa  If you need a refill on your cardiac medications before your next appointment, please call your pharmacy.   Lab work: None ordered  If you have labs (blood work) drawn today and your tests are completely normal, you will receive your results only by: Marland Kitchen MyChart Message (if you have MyChart) OR . A paper copy in the mail If you have any lab test that is abnormal or we need to change your treatment, we will call you to review the results.  Testing/Procedures: Your physician has requested that you have a cardiac MRI. SOMEONE WILL REACH OUT TO YOU TO GET THIS SCHEDULED ONCE THEY RECEIVE AUTHORIZATION FROM YOUR INSURANCE. Cardiac MRI uses a computer to create images of your heart as its beating, producing both still and moving pictures of your heart and major blood vessels. For further information please visit [email or web address] Please follow the instruction sheet given to you today for more information.   Your physician has recommended that you wear an event monitor. SOMEONE WILL REACH OUT TO YOU TO GET THIS SCHEDULED.  Event monitors are medical devices that record the heart's electrical activity. Doctors most often [place] these monitors to diagnose arrhythmias. Arrhythmias are problems with the speed or rhythm of the heartbeat. The monitor is a small, portable device. You can wear one while you do your normal daily activities. This is usually used to diagnose what is causing palpitations/syncope (passing out).    Follow-Up: At [facility], you and your health needs are our priority.  As part of our continuing mission to provide you with exceptional heart care, we have created designated Provider Care Teams.  These Care Teams include your primary Cardiologist (physician) and Advanced Practice Providers (APPs -  Physician Assistants and Nurse  Practitioners) who all work together to provide you with the care you need, when you need it. You will need a follow up appointment in 6 weeks.  I WILL HAVE SOMEONE CALL YOU WITH THIS APPOINTMENT.  You may see Ena Dawley, MD or one of the following Advanced Practice Providers on your designated Care Team:   [place], PA-C Melina Copa, PA-C . Ermalinda Barrios, PA-C  Any Other Special Instructions Will Be Listed Below (If Applicable). Ask your primary care provider about a re-trial of gabapentin or similar alternative   For an episode of syncope, the [place] DMV typically advises a patient should not drive for 6 months following the event if unprovoked. I suspect his episode was due to low BP but would recommend avoiding driving until the monitor result comes back. This is due to the possibility that the event could recur and he be on the road

## 2018-10-17 NOTE — Telephone Encounter (Signed)
Enrolled patient for a 30 Day [employer] Event monitor to be mailed. Brief instructions were gone over with the patient and he knows to expect the monitor to arrive in 3-4 days 

## 2018-10-18 ENCOUNTER — Telehealth (HOSPITAL_COMMUNITY): Payer: Self-pay

## 2018-10-18 NOTE — Telephone Encounter (Signed)
No response from pt, closed referral. °

## 2018-10-29 ENCOUNTER — Other Ambulatory Visit: Payer: Self-pay

## 2018-10-29 ENCOUNTER — Emergency Department (HOSPITAL_BASED_OUTPATIENT_CLINIC_OR_DEPARTMENT_OTHER): Payer: 59

## 2018-10-29 ENCOUNTER — Observation Stay (HOSPITAL_BASED_OUTPATIENT_CLINIC_OR_DEPARTMENT_OTHER)
Admission: EM | Admit: 2018-10-29 | Discharge: 2018-11-01 | Disposition: A | Discharge status: Home / Self Care | Payer: 59 | Attending: Internal Medicine | Admitting: Internal Medicine

## 2018-10-29 ENCOUNTER — Encounter (HOSPITAL_COMMUNITY): Payer: Self-pay

## 2018-10-29 DIAGNOSIS — Z7982 Long term (current) use of aspirin: Secondary | ICD-10-CM | POA: Insufficient documentation

## 2018-10-29 DIAGNOSIS — Z96611 Presence of right artificial shoulder joint: Secondary | ICD-10-CM | POA: Insufficient documentation

## 2018-10-29 DIAGNOSIS — R079 Chest pain, unspecified: Secondary | ICD-10-CM | POA: Diagnosis present

## 2018-10-29 DIAGNOSIS — G500 Trigeminal neuralgia: Secondary | ICD-10-CM | POA: Diagnosis present

## 2018-10-29 DIAGNOSIS — E876 Hypokalemia: Secondary | ICD-10-CM | POA: Diagnosis not present

## 2018-10-29 DIAGNOSIS — E860 Dehydration: Secondary | ICD-10-CM | POA: Diagnosis present

## 2018-10-29 DIAGNOSIS — K828 Other specified diseases of gallbladder: Secondary | ICD-10-CM | POA: Insufficient documentation

## 2018-10-29 DIAGNOSIS — R002 Palpitations: Secondary | ICD-10-CM | POA: Insufficient documentation

## 2018-10-29 DIAGNOSIS — K219 Gastro-esophageal reflux disease without esophagitis: Secondary | ICD-10-CM | POA: Diagnosis not present

## 2018-10-29 DIAGNOSIS — I2510 Atherosclerotic heart disease of native coronary artery without angina pectoris: Secondary | ICD-10-CM | POA: Insufficient documentation

## 2018-10-29 DIAGNOSIS — D696 Thrombocytopenia, unspecified: Secondary | ICD-10-CM | POA: Insufficient documentation

## 2018-10-29 DIAGNOSIS — R0609 Other forms of dyspnea: Secondary | ICD-10-CM | POA: Diagnosis not present

## 2018-10-29 DIAGNOSIS — I5032 Chronic diastolic (congestive) heart failure: Secondary | ICD-10-CM | POA: Diagnosis present

## 2018-10-29 DIAGNOSIS — Z7902 Long term (current) use of antithrombotics/antiplatelets: Secondary | ICD-10-CM | POA: Insufficient documentation

## 2018-10-29 DIAGNOSIS — R7989 Other specified abnormal findings of blood chemistry: Secondary | ICD-10-CM

## 2018-10-29 DIAGNOSIS — R000 Tachycardia, unspecified: Secondary | ICD-10-CM | POA: Diagnosis not present

## 2018-10-29 DIAGNOSIS — E039 Hypothyroidism, unspecified: Secondary | ICD-10-CM | POA: Insufficient documentation

## 2018-10-29 DIAGNOSIS — R945 Abnormal results of liver function studies: Secondary | ICD-10-CM | POA: Diagnosis not present

## 2018-10-29 DIAGNOSIS — R319 Hematuria, unspecified: Secondary | ICD-10-CM | POA: Insufficient documentation

## 2018-10-29 DIAGNOSIS — I10 Essential (primary) hypertension: Secondary | ICD-10-CM | POA: Diagnosis present

## 2018-10-29 DIAGNOSIS — I130 Hypertensive heart and chronic kidney disease with heart failure and stage 1 through stage 4 chronic kidney disease, or unspecified chronic kidney disease: Secondary | ICD-10-CM | POA: Diagnosis not present

## 2018-10-29 DIAGNOSIS — N281 Cyst of kidney, acquired: Secondary | ICD-10-CM | POA: Diagnosis not present

## 2018-10-29 DIAGNOSIS — Z8679 Personal history of other diseases of the circulatory system: Secondary | ICD-10-CM

## 2018-10-29 DIAGNOSIS — I5042 Chronic combined systolic (congestive) and diastolic (congestive) heart failure: Secondary | ICD-10-CM | POA: Diagnosis not present

## 2018-10-29 DIAGNOSIS — E785 Hyperlipidemia, unspecified: Secondary | ICD-10-CM | POA: Insufficient documentation

## 2018-10-29 DIAGNOSIS — R0789 Other chest pain: Principal | ICD-10-CM | POA: Insufficient documentation

## 2018-10-29 DIAGNOSIS — N400 Enlarged prostate without lower urinary tract symptoms: Secondary | ICD-10-CM | POA: Insufficient documentation

## 2018-10-29 DIAGNOSIS — R112 Nausea with vomiting, unspecified: Secondary | ICD-10-CM | POA: Insufficient documentation

## 2018-10-29 DIAGNOSIS — Z955 Presence of coronary angioplasty implant and graft: Secondary | ICD-10-CM | POA: Insufficient documentation

## 2018-10-29 DIAGNOSIS — Z1159 Encounter for screening for other viral diseases: Secondary | ICD-10-CM | POA: Insufficient documentation

## 2018-10-29 DIAGNOSIS — Z8249 Family history of ischemic heart disease and other diseases of the circulatory system: Secondary | ICD-10-CM | POA: Insufficient documentation

## 2018-10-29 DIAGNOSIS — N183 Chronic kidney disease, stage 3 (moderate): Secondary | ICD-10-CM | POA: Insufficient documentation

## 2018-10-29 DIAGNOSIS — Z885 Allergy status to narcotic agent status: Secondary | ICD-10-CM | POA: Insufficient documentation

## 2018-10-29 DIAGNOSIS — Z79899 Other long term (current) drug therapy: Secondary | ICD-10-CM | POA: Insufficient documentation

## 2018-10-29 DIAGNOSIS — K429 Umbilical hernia without obstruction or gangrene: Secondary | ICD-10-CM | POA: Insufficient documentation

## 2018-10-29 DIAGNOSIS — Z8673 Personal history of transient ischemic attack (TIA), and cerebral infarction without residual deficits: Secondary | ICD-10-CM | POA: Insufficient documentation

## 2018-10-29 DIAGNOSIS — I252 Old myocardial infarction: Secondary | ICD-10-CM | POA: Insufficient documentation

## 2018-10-29 DIAGNOSIS — Z87891 Personal history of nicotine dependence: Secondary | ICD-10-CM | POA: Insufficient documentation

## 2018-10-29 DIAGNOSIS — K760 Fatty (change of) liver, not elsewhere classified: Secondary | ICD-10-CM | POA: Insufficient documentation

## 2018-10-29 DIAGNOSIS — N179 Acute kidney failure, unspecified: Secondary | ICD-10-CM | POA: Insufficient documentation

## 2018-10-29 LAB — CBC WITH DIFFERENTIAL/PLATELET
Abs Immature Granulocytes: 0.07 10*3/uL (ref 0.00–0.07)
Basophils Absolute: 0 10*3/uL (ref 0.0–0.1)
Basophils Relative: 0 %
Eosinophils Absolute: 0 10*3/uL (ref 0.0–0.5)
Eosinophils Relative: 0 %
HCT: 46.9 % (ref 39.0–52.0)
Hemoglobin: 16.4 g/dL (ref 13.0–17.0)
Immature Granulocytes: 1 %
Lymphocytes Relative: 18 %
Lymphs Abs: 1.2 10*3/uL (ref 0.7–4.0)
MCH: 34.7 pg — ABNORMAL HIGH (ref 26.0–34.0)
MCHC: 35 g/dL (ref 30.0–36.0)
MCV: 99.2 fL (ref 80.0–100.0)
Monocytes Absolute: 0.8 10*3/uL (ref 0.1–1.0)
Monocytes Relative: 11 %
Neutro Abs: 4.7 10*3/uL (ref 1.7–7.7)
Neutrophils Relative %: 70 %
Platelets: 85 10*3/uL — ABNORMAL LOW (ref 150–400)
RBC: 4.73 MIL/uL (ref 4.22–5.81)
RDW: 14.3 % (ref 11.5–15.5)
WBC: 6.7 10*3/uL (ref 4.0–10.5)
nRBC: 0.6 % — ABNORMAL HIGH (ref 0.0–0.2)

## 2018-10-29 LAB — URINALYSIS, ROUTINE W REFLEX MICROSCOPIC
Glucose, UA: NEGATIVE mg/dL
Ketones, ur: 40 mg/dL — AB
Leukocytes,Ua: NEGATIVE
Nitrite: NEGATIVE
Protein, ur: 100 mg/dL — AB
Specific Gravity, Urine: 1.015 (ref 1.005–1.030)
pH: 6 (ref 5.0–8.0)

## 2018-10-29 LAB — COMPREHENSIVE METABOLIC PANEL
ALT: 125 U/L — ABNORMAL HIGH (ref 0–44)
AST: 227 U/L — ABNORMAL HIGH (ref 15–41)
Albumin: 5 g/dL (ref 3.5–5.0)
Alkaline Phosphatase: 70 U/L (ref 38–126)
Anion gap: 31 — ABNORMAL HIGH (ref 5–15)
BUN: 23 mg/dL — ABNORMAL HIGH (ref 6–20)
CO2: 11 mmol/L — ABNORMAL LOW (ref 22–32)
Calcium: 10.3 mg/dL (ref 8.9–10.3)
Chloride: 90 mmol/L — ABNORMAL LOW (ref 98–111)
Creatinine, Ser: 1.44 mg/dL — ABNORMAL HIGH (ref 0.61–1.24)
GFR calc Af Amer: >60 mL/min (ref >60)
GFR calc non Af Amer: 54 mL/min — ABNORMAL LOW (ref >60)
Glucose, Bld: 116 mg/dL — ABNORMAL HIGH (ref 70–99)
Potassium: 4.1 mmol/L (ref 3.5–5.1)
Sodium: 132 mmol/L — ABNORMAL LOW (ref 135–145)
Total Bilirubin: 3.7 mg/dL — ABNORMAL HIGH (ref 0.3–1.2)
Total Protein: 9.6 g/dL — ABNORMAL HIGH (ref 6.5–8.1)

## 2018-10-29 LAB — BASIC METABOLIC PANEL
Anion gap: 23 — ABNORMAL HIGH (ref 5–15)
BUN: 27 mg/dL — ABNORMAL HIGH (ref 6–20)
CO2: 13 mmol/L — ABNORMAL LOW (ref 22–32)
Calcium: 8.8 mg/dL — ABNORMAL LOW (ref 8.9–10.3)
Chloride: 96 mmol/L — ABNORMAL LOW (ref 98–111)
Creatinine, Ser: 1.72 mg/dL — ABNORMAL HIGH (ref 0.61–1.24)
GFR calc Af Amer: 51 mL/min — ABNORMAL LOW (ref >60)
GFR calc non Af Amer: 44 mL/min — ABNORMAL LOW (ref >60)
Glucose, Bld: 115 mg/dL — ABNORMAL HIGH (ref 70–99)
Potassium: 4 mmol/L (ref 3.5–5.1)
Sodium: 132 mmol/L — ABNORMAL LOW (ref 135–145)

## 2018-10-29 LAB — URINALYSIS, MICROSCOPIC (REFLEX)

## 2018-10-29 LAB — HEPARIN LEVEL (UNFRACTIONATED): Heparin Unfractionated: 0.53 IU/mL (ref 0.30–0.70)

## 2018-10-29 LAB — MRSA PCR SCREENING: MRSA by PCR: NEGATIVE

## 2018-10-29 LAB — TROPONIN I (HIGH SENSITIVITY)
Troponin I (High Sensitivity): 20 ng/L — ABNORMAL HIGH (ref <18)
Troponin I (High Sensitivity): 29 ng/L — ABNORMAL HIGH (ref <18)

## 2018-10-29 LAB — APTT: aPTT: 27 seconds (ref 24–36)

## 2018-10-29 LAB — PROTIME-INR
INR: 1.2 (ref 0.8–1.2)
Prothrombin Time: 14.9 seconds (ref 11.4–15.2)

## 2018-10-29 LAB — SARS CORONAVIRUS 2 AG (30 MIN TAT): SARS Coronavirus 2 Ag: NEGATIVE

## 2018-10-29 LAB — BRAIN NATRIURETIC PEPTIDE: B Natriuretic Peptide: 37 pg/mL (ref 0.0–100.0)

## 2018-10-29 LAB — ETHANOL: Alcohol, Ethyl (B): <10 mg/dL (ref <10)

## 2018-10-29 LAB — D-DIMER, QUANTITATIVE: D-Dimer, Quant: 3.89 ug/mL-FEU — ABNORMAL HIGH (ref 0.00–0.50)

## 2018-10-29 LAB — LACTIC ACID, PLASMA: Lactic Acid, Venous: 1.6 mmol/L (ref 0.5–1.9)

## 2018-10-29 LAB — CK: Total CK: 241 U/L (ref 49–397)

## 2018-10-29 MED ORDER — NITROGLYCERIN 0.4 MG SL SUBL
SUBLINGUAL_TABLET | SUBLINGUAL | Status: AC
  Filled 2018-10-29: qty 1

## 2018-10-29 MED ORDER — HEPARIN BOLUS VIA INFUSION
4000.0000 [IU] | Freq: Once | INTRAVENOUS | Status: AC
  Administered 2018-10-29: 4000 [IU] via INTRAVENOUS

## 2018-10-29 MED ORDER — TRAZODONE HCL 50 MG PO TABS: 25.0000 mg | ORAL_TABLET | Freq: Every evening | ORAL | Status: DC | PRN

## 2018-10-29 MED ORDER — ORAL CARE MOUTH RINSE
15.0000 mL | Freq: Two times a day (BID) | OROMUCOSAL | Status: DC
  Administered 2018-10-29 – 2018-10-30 (×2): 15 mL via OROMUCOSAL

## 2018-10-29 MED ORDER — SODIUM CHLORIDE 0.9 % IV SOLN
INTRAVENOUS | Status: DC | PRN
  Administered 2018-10-29: 500 mL via INTRAVENOUS

## 2018-10-29 MED ORDER — FENTANYL CITRATE (PF) 100 MCG/2ML IJ SOLN
INTRAMUSCULAR | Status: AC
  Filled 2018-10-29: qty 2

## 2018-10-29 MED ORDER — NITROGLYCERIN 0.4 MG SL SUBL
0.4000 mg | SUBLINGUAL_TABLET | SUBLINGUAL | Status: DC | PRN
  Administered 2018-10-29: 0.4 mg via SUBLINGUAL

## 2018-10-29 MED ORDER — SODIUM CHLORIDE 0.9 % IV SOLN
INTRAVENOUS | Status: AC
  Administered 2018-10-29: via INTRAVENOUS

## 2018-10-29 MED ORDER — CLOPIDOGREL BISULFATE 75 MG PO TABS
75.0000 mg | ORAL_TABLET | Freq: Every day | ORAL | Status: DC
  Administered 2018-10-30 – 2018-11-01 (×3): 75 mg via ORAL
  Filled 2018-10-29 (×3): qty 1

## 2018-10-29 MED ORDER — METOCLOPRAMIDE HCL 5 MG/ML IJ SOLN
10.0000 mg | INTRAMUSCULAR | Status: AC
  Administered 2018-10-29: 10 mg via INTRAVENOUS
  Filled 2018-10-29: qty 2

## 2018-10-29 MED ORDER — ONDANSETRON HCL 4 MG/2ML IJ SOLN
4.0000 mg | Freq: Four times a day (QID) | INTRAMUSCULAR | Status: DC | PRN
  Administered 2018-10-30 – 2018-11-01 (×8): 4 mg via INTRAVENOUS
  Filled 2018-10-29 (×8): qty 2

## 2018-10-29 MED ORDER — FENTANYL CITRATE (PF) 100 MCG/2ML IJ SOLN
50.0000 ug | INTRAMUSCULAR | Status: DC | PRN
  Administered 2018-10-30 – 2018-11-01 (×11): 50 ug via INTRAVENOUS
  Filled 2018-10-29 (×11): qty 2

## 2018-10-29 MED ORDER — PANTOPRAZOLE SODIUM 20 MG PO TBEC
20.0000 mg | DELAYED_RELEASE_TABLET | Freq: Every day | ORAL | Status: DC
  Administered 2018-10-30 – 2018-11-01 (×3): 20 mg via ORAL
  Filled 2018-10-29 (×3): qty 1

## 2018-10-29 MED ORDER — ONDANSETRON HCL 4 MG PO TABS: 4.0000 mg | ORAL_TABLET | Freq: Four times a day (QID) | ORAL | Status: DC | PRN

## 2018-10-29 MED ORDER — HEPARIN (PORCINE) 25000 UT/250ML-% IV SOLN
1350.0000 [IU]/h | INTRAVENOUS | Status: DC
  Filled 2018-10-29: qty 250

## 2018-10-29 MED ORDER — EZETIMIBE 10 MG PO TABS
10.0000 mg | ORAL_TABLET | Freq: Every day | ORAL | Status: DC
  Administered 2018-10-30 – 2018-11-01 (×3): 10 mg via ORAL
  Filled 2018-10-29 (×3): qty 1

## 2018-10-29 MED ORDER — SODIUM CHLORIDE 0.9 % IV BOLUS
500.0000 mL | Freq: Once | INTRAVENOUS | Status: AC
  Administered 2018-10-29: 500 mL via INTRAVENOUS

## 2018-10-29 MED ORDER — ASPIRIN EC 81 MG PO TBEC
81.0000 mg | DELAYED_RELEASE_TABLET | Freq: Every day | ORAL | Status: DC
  Administered 2018-10-30 – 2018-11-01 (×3): 81 mg via ORAL
  Filled 2018-10-29 (×3): qty 1

## 2018-10-29 MED ORDER — IOHEXOL 350 MG/ML SOLN
100.0000 mL | Freq: Once | INTRAVENOUS | Status: AC | PRN
  Administered 2018-10-29: 100 mL via INTRAVENOUS

## 2018-10-29 MED ORDER — FENTANYL CITRATE (PF) 100 MCG/2ML IJ SOLN
100.0000 ug | Freq: Once | INTRAMUSCULAR | Status: AC
  Administered 2018-10-29: 16:00:00 100 ug via INTRAVENOUS
  Filled 2018-10-29: qty 2

## 2018-10-29 MED ORDER — AMLODIPINE BESYLATE 10 MG PO TABS
10.0000 mg | ORAL_TABLET | Freq: Every day | ORAL | Status: DC
  Administered 2018-10-30 – 2018-11-01 (×3): 10 mg via ORAL
  Filled 2018-10-29 (×3): qty 1

## 2018-10-29 MED ORDER — METOPROLOL SUCCINATE ER 50 MG PO TB24
50.0000 mg | ORAL_TABLET | Freq: Every day | ORAL | Status: DC
  Administered 2018-10-30 – 2018-11-01 (×3): 50 mg via ORAL
  Filled 2018-10-29 (×3): qty 1

## 2018-10-29 MED ORDER — LOSARTAN POTASSIUM 50 MG PO TABS
100.0000 mg | ORAL_TABLET | Freq: Every day | ORAL | Status: DC
  Administered 2018-10-30 – 2018-11-01 (×3): 100 mg via ORAL
  Filled 2018-10-29 (×3): qty 2

## 2018-10-29 MED ORDER — HEPARIN (PORCINE) 25000 UT/250ML-% IV SOLN
1350.0000 [IU]/h | INTRAVENOUS | Status: DC
  Administered 2018-10-29: 1350 [IU]/h via INTRAVENOUS

## 2018-10-29 MED ORDER — ASPIRIN 81 MG PO CHEW
324.0000 mg | CHEWABLE_TABLET | Freq: Once | ORAL | Status: AC
  Administered 2018-10-29: 324 mg via ORAL
  Filled 2018-10-29: qty 4

## 2018-10-29 MED ORDER — SODIUM CHLORIDE 0.9 % IV BOLUS
1000.0000 mL | Freq: Once | INTRAVENOUS | Status: AC
  Administered 2018-10-29: 1000 mL via INTRAVENOUS

## 2018-10-29 MED ORDER — NITROGLYCERIN 0.4 MG SL SUBL: 0.4000 mg | SUBLINGUAL_TABLET | SUBLINGUAL | Status: DC | PRN

## 2018-10-29 MED ORDER — FENTANYL CITRATE (PF) 100 MCG/2ML IJ SOLN
100.0000 ug | Freq: Once | INTRAMUSCULAR | Status: AC
  Administered 2018-10-29: 100 ug via INTRAVENOUS
  Filled 2018-10-29: qty 2

## 2018-10-29 MED ORDER — FLUOXETINE HCL 20 MG PO CAPS
20.0000 mg | ORAL_CAPSULE | Freq: Every day | ORAL | Status: DC
  Administered 2018-10-30 – 2018-11-01 (×3): 20 mg via ORAL
  Filled 2018-10-29 (×3): qty 1

## 2018-10-29 MED ORDER — SODIUM CHLORIDE 0.9 % IV SOLN: Freq: Once | INTRAVENOUS | Status: DC

## 2018-10-29 MED ORDER — FENTANYL CITRATE (PF) 100 MCG/2ML IJ SOLN
50.0000 ug | Freq: Once | INTRAMUSCULAR | Status: AC
  Administered 2018-10-29: 20:00:00 50 ug via INTRAVENOUS

## 2018-10-29 NOTE — ED Notes (Signed)
Pts b/p dropped to 98/76 after nitro administration. EDP notified

## 2018-10-29 NOTE — ED Notes (Signed)
Unsuccesful IV attempt in [place].

## 2018-10-29 NOTE — H&P (Signed)
History and Physical    [name] IEP:[id number] DOB: 1960-08-16 DOA: 10/29/2018  PCP: [name], DO  Patient coming from: Home.  Chief Complaint: Chest pain nausea vomiting.  HPI: [name] is a 58 y.o. male with history of CAD status post ending in February 2020, hypertension, chronic disease stage III, drug abuse, trigeminal neuralgia, hyperlipidemia hypothyroidism presents to the ER at [facility] with complaints of persistent chest pain with nausea vomiting over the last 3 days.  Patient had a similar picture and presentation in April of this year when cardiac cath was done and was unremarkable.  Patient stated chest pain is retrosternal stabbing in nature nonradiating present even at rest increased on exertion.  No associated shortness of breath.  Nausea vomiting is persistent has no relation to food and no blood in the vomitus.  Had noted some blood in the urine.  ED Course: In the ER admits in [facility] EKG showing normal sinus rhythm nonspecific ST changes.  Labs revealed elevated LFTs with AST 227 ALT 125 total bilirubin 3.7 sonogram of the abdomen was unremarkable CT angiogram of the chest and abdomen pelvis was also showing nothing acute.  UA showing nonspecific changes.  Due to some ketones.  Patient was started on IV fluids pain relief medication admitted for persistent chest pain with nausea vomiting.  Cause not clear.  Platelets were 85 decreased from previous.  Patient denies drinking alcohol recently.  Review of Systems: As per HPI, rest all negative.   Past Medical History:  Diagnosis Date   BPH (benign prostatic hyperplasia)    CAD (coronary artery disease)    a. NSTEMI troponin >65 with cath 11/2016 S/p PTCA & DES to mid circumflex. b. 05/2018 s/p DES to [facility].   Chronic chest pain    Chronic combined systolic and diastolic CHF (congestive heart failure) ([place])    a. EF 45% in 2018. b. EF 55-60% by echo 01/2017. c. EF 48% by nuc  07/2017.   CKD (chronic kidney disease), stage II    Cluster headache    hx of   Complication of anesthesia    difficult waking up"   Diverticulosis    Drug abuse (HCC)    hx of   GERD (gastroesophageal reflux disease)    Hiatal hernia    Hyperlipidemia    Hypertension    Hypothyroidism    Nontoxic multinodular goiter    NSTEMI (non-ST elevated myocardial infarction) ([place])    Perforated nasal septum    Pulmonary nodule --- CT 10/19/2014: Nodule is stable, no further routine x-rays 01/06/2009   Thrombocytopenia (HCC)    TIA (transient ischemic attack)    Trigeminal neuralgia    Tubular adenoma of colon 12/2015   Unspecified asthma(493.90)     Past Surgical History:  Procedure Laterality Date   APPENDECTOMY     CARDIAC CATHETERIZATION N/A 11/25/2014   Procedure: Left Heart Cath and Coronary Angiography;  Surgeon: Belva Crome, MD;  Location: [place] [facility];  Service: Cardiovascular;  Laterality: N/A;   CARDIAC CATHETERIZATION  12/08/2008   Archie Endo 08/24/2010   COLECTOMY  ~ 1976   "I had a blockage"   CORONARY STENT INTERVENTION N/A 11/27/2016   Procedure: CORONARY STENT INTERVENTION;  Surgeon: Belva Crome, MD;  Location: [place] [facility];  Service: Cardiovascular;  Laterality: N/A;   CORONARY STENT INTERVENTION N/A 06/10/2018   Procedure: CORONARY STENT INTERVENTION;  Surgeon: Nelva Bush, MD;  Location: [place] [facility];  Service: Cardiovascular;  Laterality: N/A;   CORONARY STENT PLACEMENT  11/27/2016   STENT XIENCE ALPINE RX 3.0X15 drug eluting stent was successfully placed   CYST EXCISION Left 10/31/2016   Jaw   DESTRUCTION TRIGEMINAL NERVE VIA NEUROLYTIC AGENT  x 3 , R side last ~2010   KNEE ARTHROSCOPY Right 06/26/2000   Arthroscopy followed by open lateral release/notes 09/06/2010   LEFT HEART CATH AND CORONARY ANGIOGRAPHY N/A 11/27/2016   Procedure: LEFT HEART CATH AND CORONARY ANGIOGRAPHY;  Surgeon: Belva Crome, MD;  Location:  [place] [facility];  Service: Cardiovascular;  Laterality: N/A;   LEFT HEART CATH AND CORONARY ANGIOGRAPHY N/A 12/07/2016   Procedure: LEFT HEART CATH AND CORONARY ANGIOGRAPHY;  Surgeon: Nelva Bush, MD;  Location: [place] [facility];  Service: Cardiovascular;  Laterality: N/A;   LEFT HEART CATH AND CORONARY ANGIOGRAPHY N/A 06/10/2018   Procedure: LEFT HEART CATH AND CORONARY ANGIOGRAPHY;  Surgeon: Nelva Bush, MD;  Location: [place] [facility];  Service: Cardiovascular;  Laterality: N/A;   LEFT HEART CATH AND CORONARY ANGIOGRAPHY N/A 07/24/2018   Procedure: LEFT HEART CATH AND CORONARY ANGIOGRAPHY;  Surgeon: Belva Crome, MD;  Location: [place] [facility];  Service: Cardiovascular;  Laterality: N/A;   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 Ardine Bjork 09/06/2010   SHOULDER HEMI-ARTHROPLASTY Right 05/26/2010   revision/notes 05/27/2010     reports that he quit smoking about 1 years ago. His smoking use included cigarettes. He has a 3.00 pack-year smoking history. He has never used smokeless tobacco. He reports current alcohol use of about 1.0 standard drinks of alcohol per week. He reports that he does not use drugs.  Allergies  Allergen Reactions   Tramadol Palpitations    Family History  Problem Relation Age of Onset   Diabetes Mother    Hyperlipidemia Mother    Hypertension Mother    Hypertension Father    Heart disease Father    Cancer Father    Sudden death Father        OCT 24-Jul-2009   CAD Father    Heart failure Father    Mesothelioma Father    Heart disease Maternal Aunt    Prostate cancer Neg Hx    Colon cancer Neg Hx     Prior to Admission medications   Medication Sig Start Date End Date Taking? Authorizing Provider  amLODipine (NORVASC) 10 MG tablet Take 1 tablet (10 mg total) by mouth daily. 06/07/18  Yes [name], DO  aspirin EC 81 MG tablet Take 81 mg by mouth daily.   Yes [provider]  clopidogrel  (PLAVIX) 75 MG tablet Take 75 mg by mouth daily.   Yes [provider]  Evolocumab (REPATHA SURECLICK) 638 MG/ML SOAJ Inject 140 mg into the skin every 14 (fourteen) days. 09/12/18  Yes Dorothy Spark, MD  ezetimibe (ZETIA) 10 MG tablet Take 1 tablet (10 mg total) by mouth daily. 05/27/18  Yes [name], DO  FLUoxetine (PROZAC) 20 MG tablet Take 20 mg by mouth daily.   Yes [provider]  fluticasone (FLONASE) 50 MCG/ACT nasal spray Place 2 sprays into both nostrils daily. 05/27/18  Yes [name], DO  lansoprazole (PREVACID) 30 MG capsule Take 1 capsule (30 mg total) by mouth 2 (two) times daily before a meal. 05/27/18  Yes Wendling, Crosby Oyster, DO  levocetirizine (XYZAL) 5 MG tablet Take 1 tablet (5 mg total) by mouth every evening. 05/27/18  Yes Wendling,  Crosby Oyster, DO  losartan (COZAAR) 100 MG tablet Take 100 mg by mouth daily.   Yes [provider]  metoprolol succinate (TOPROL-XL) 50 MG 24 hr tablet Take 1 tablet (50 mg total) by mouth daily. Take with or immediately following a meal. 10/09/18  Yes Wendling, Crosby Oyster, DO  nitroGLYCERIN (NITROSTAT) 0.4 MG SL tablet Place 1 tablet (0.4 mg total) under the tongue every 5 (five) minutes as needed for chest pain. 05/28/18  Yes Imogene Burn, PA-C  Polyethyl Glycol-Propyl Glycol (SYSTANE OP) Place 1 drop into both eyes every 3 (three) hours as needed (dry eyes).    Yes [provider]  rosuvastatin (CRESTOR) 40 MG tablet Take 1 tablet (40 mg total) by mouth at bedtime. 06/11/18  Yes Cheryln Manly, NP  traZODone (DESYREL) 50 MG tablet Take 0.5-1 tablets (25-50 mg total) by mouth at bedtime as needed for sleep. 09/25/18  Yes [name], DO  promethazine (PHENERGAN) 12.5 MG tablet Take 12.5 mg by mouth every 6 (six) hours as needed for nausea or vomiting.    [provider]    Physical Exam: Constitutional: Moderately built and nourished. Vitals:   10/29/18  1830 10/29/18 1945 10/29/18 2100 10/29/18 2200  BP: 109/82 (!) 122/91  114/76  Pulse: 89   88  Resp: 17   14  Temp:      TempSrc:      SpO2: 94%   98%  Weight:   102.7 kg   Height:   5\' 10"  (1.778 m)    Eyes: Anicteric no pallor. ENMT: No discharge from the ears eyes nose and mouth. Neck: No mass felt.  No neck rigidity. Respiratory: No rhonchi or crepitations. Cardiovascular: S1-S2 heard. Abdomen: Soft nontender bowel sounds present. Musculoskeletal: No edema.  No joint effusion. Skin: No rash. Neurologic: Alert awake oriented to time place and person.  Moves all extremities. Psychiatric: Appears normal per normal affect.   Labs on Admission: I have personally reviewed following labs and imaging studies  CBC: Recent Labs  Lab 10/29/18 1003  WBC 6.7  NEUTROABS 4.7  HGB 16.4  HCT 46.9  MCV 99.2  PLT 85*   Basic Metabolic Panel: Recent Labs  Lab 10/29/18 1003 10/29/18 1520  NA 132* 132*  K 4.1 4.0  CL 90* 96*  CO2 11* 13*  GLUCOSE 116* 115*  BUN 23* 27*  CREATININE 1.44* 1.72*  CALCIUM 10.3 8.8*   GFR: Estimated Creatinine Clearance: 58.3 mL/min (A) (by C-G formula based on SCr of 1.72 mg/dL (H)). Liver Function Tests: Recent Labs  Lab 10/29/18 1003  AST 227*  ALT 125*  ALKPHOS 70  BILITOT 3.7*  PROT 9.6*  ALBUMIN 5.0   No results for input(s): LIPASE, AMYLASE in the last 168 hours. No results for input(s): AMMONIA in the last 168 hours. Coagulation Profile: Recent Labs  Lab 10/29/18 1550  INR 1.2   Cardiac Enzymes: Recent Labs  Lab 10/29/18 1642  CKTOTAL 241   BNP (last 3 results) No results for input(s): PROBNP in the last 8760 hours. HbA1C: No results for input(s): HGBA1C in the last 72 hours. CBG: No results for input(s): GLUCAP in the last 168 hours. Lipid Profile: No results for input(s): CHOL, HDL, LDLCALC, TRIG, CHOLHDL, LDLDIRECT in the last 72 hours. Thyroid Function Tests: No results for input(s): TSH, T4TOTAL, FREET4,  T3FREE, THYROIDAB in the last 72 hours. Anemia Panel: No results for input(s): VITAMINB12, FOLATE, FERRITIN, TIBC, IRON, RETICCTPCT in the last 72 hours. Urine  analysis:    Component Value Date/Time   COLORURINE BROWN (A) 10/29/2018 1707   APPEARANCEUR HAZY (A) 10/29/2018 1707   LABSPEC 1.015 10/29/2018 1707   PHURINE 6.0 10/29/2018 1707   GLUCOSEU NEGATIVE 10/29/2018 1707   GLUCOSEU NEGATIVE 05/27/2018 1421   HGBUR MODERATE (A) 10/29/2018 1707   BILIRUBINUR MODERATE (A) 10/29/2018 1707   BILIRUBINUR negative 02/23/2017 1539   KETONESUR 40 (A) 10/29/2018 1707   PROTEINUR 100 (A) 10/29/2018 1707   UROBILINOGEN 0.2 05/27/2018 1421   NITRITE NEGATIVE 10/29/2018 1707   LEUKOCYTESUR NEGATIVE 10/29/2018 1707   Sepsis Labs: @LABRCNTIP (procalcitonin:4,lacticidven:4) ) Recent Results (from the past 240 hour(s))  SARS Coronavirus 2 (Hosp order,Performed in [facility] via [facility])     Status: None   Collection Time: 10/29/18 11:36 AM   Specimen: Dry Nasal Swab ([facility] Now)  Result Value Ref Range Status   SARS Coronavirus 2 ([facility] Now) NEGATIVE NEGATIVE Final    Comment: (NOTE) SARS-CoV-2 target nucleic acids are NOT DETECTED. The SARS-CoV-2 RNA is generally detectable in upper and lower respiratory specimens during the acute phase of infection.  Negativeresults do not preclude SARS-CoV-2 infection, do not rule out coinfections with other pathogens, and should not be used as the  sole basis for treatment or other patient management decisions.  Negative results must be combined with clinical observations, patient history, and epidemiological information. The expected result is Negative. Fact Sheet for Patients: GolfingFamily.no Fact Sheet for Healthcare Providers: https://www.hernandez-brewer.com/ This test is not yet approved or cleared by the [place] FDA and  has been authorized for detection and/or diagnosis of SARS-CoV-2  by FDA under an Emergency Use Authorization (EUA).  This EUA will remain in effect (meaning this test can be used) for the duration of  the COVID19 declaration under Section 5 64(b)(1) of the Act, 21 U.S.C.  section [phone] 3(b)(1), unless the authorization is terminated or revoked sooner. Performed at [facility], [address] [place], [place] [address]   MRSA PCR Screening     Status: None   Collection Time: 10/29/18  8:07 PM   Specimen: Nasal Mucosa; Nasopharyngeal  Result Value Ref Range Status   MRSA by PCR NEGATIVE NEGATIVE Final    Comment:        The GeneXpert MRSA Assay (FDA approved for NASAL specimens only), is one component of a comprehensive MRSA colonization surveillance program. It is not intended to diagnose MRSA infection nor to guide or monitor treatment for MRSA infections. Performed at [facility], [place] [address], [place], [place] [address]      Radiological Exams on Admission: Dg Chest 2 View  Result Date: 10/29/2018 CLINICAL DATA:  Shortness of breath EXAM: CHEST - 2 VIEW COMPARISON:  July 23, 2018 FINDINGS: There is slight bibasilar atelectasis. There is no edema or consolidation. The heart size and pulmonary vascularity are normal. No adenopathy. 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 IMPRESSION: Slight bibasilar atelectasis.  No edema or consolidation. Electronically Signed   By: Lowella Grip III M.D.   On: 10/29/2018 11:07   Ct Angio Chest Pe W/cm &/or Wo Cm  Result Date: 10/29/2018 CLINICAL DATA:  Chest pain, shortness of breath, tachycardia, no abdominal pain EXAM: CT ANGIOGRAPHY CHEST CT ABDOMEN AND PELVIS WITH CONTRAST TECHNIQUE: Multidetector CT imaging of the chest was performed using the standard protocol during bolus administration of intravenous contrast. Multiplanar CT image reconstructions and MIPs were obtained to evaluate the vascular anatomy.  Multidetector CT imaging of the abdomen and pelvis  was performed using the standard protocol during bolus administration of intravenous contrast. CONTRAST:  172mL OMNIPAQUE IOHEXOL 350 MG/ML SOLN COMPARISON:  None. FINDINGS: CTA CHEST FINDINGS Cardiovascular: Satisfactory opacification of the pulmonary arteries to the segmental level. No evidence of pulmonary embolism. Normal heart size. No pericardial effusion. Thoracic aorta is normal in caliber. No thoracic aortic dissection. Mediastinum/Nodes: No enlarged mediastinal, hilar, or axillary lymph nodes. Thyroid gland, trachea, and esophagus demonstrate no significant findings. Lungs/Pleura: Lungs are clear. No pleural effusion or pneumothorax. Musculoskeletal: No acute osseous abnormality. No aggressive osseous lesion. Review of the MIP images confirms the above findings. CT ABDOMEN and PELVIS FINDINGS Hepatobiliary: Diffuse low attenuation of the liver as can be seen with hepatic steatosis. No intrahepatic or extrahepatic biliary duct dilatation. No hepatic mass. High-density material within the gallbladder likely reflecting gallbladder sludge. Pancreas: Unremarkable. No pancreatic ductal dilatation or surrounding inflammatory changes. Spleen: Normal in size without focal abnormality. Adrenals/Urinary Tract: Normal adrenal glands. Two 15 mm hypodense, fluid attenuating left renal masses consistent with cysts. Kidneys are otherwise unremarkable. No urolithiasis or obstructive uropathy. Stomach/Bowel: Stomach is within normal limits. Appendix appears normal. No evidence of bowel wall thickening, distention, or inflammatory changes. Vascular/Lymphatic: Normal caliber abdominal aorta with mild atherosclerosis. No lymphadenopathy. Reproductive: Prostate is unremarkable. Other: Small fat containing umbilical hernia.  No ascites. Musculoskeletal: No acute osseous abnormality. No aggressive osseous lesion. Moderate facet arthropathy at L5-S1. Review of the MIP images confirms the above findings. IMPRESSION: 1. No evidence  pulmonary embolus. 2. No acute abnormality of the chest, abdomen or pelvis. 3. Severe hepatic steatosis. 4.  Aortic Atherosclerosis (ICD10-I70.0). Electronically Signed   By: Kathreen Devoid   On: 10/29/2018 12:22   Ct Abdomen Pelvis W Contrast  Result Date: 10/29/2018 CLINICAL DATA:  Chest pain, shortness of breath, tachycardia, no abdominal pain EXAM: CT ANGIOGRAPHY CHEST CT ABDOMEN AND PELVIS WITH CONTRAST TECHNIQUE: Multidetector CT imaging of the chest was performed using the standard protocol during bolus administration of intravenous contrast. Multiplanar CT image reconstructions and MIPs were obtained to evaluate the vascular anatomy. Multidetector CT imaging of the abdomen and pelvis was performed using the standard protocol during bolus administration of intravenous contrast. CONTRAST:  126mL OMNIPAQUE IOHEXOL 350 MG/ML SOLN COMPARISON:  None. FINDINGS: CTA CHEST FINDINGS Cardiovascular: Satisfactory opacification of the pulmonary arteries to the segmental level. No evidence of pulmonary embolism. Normal heart size. No pericardial effusion. Thoracic aorta is normal in caliber. No thoracic aortic dissection. Mediastinum/Nodes: No enlarged mediastinal, hilar, or axillary lymph nodes. Thyroid gland, trachea, and esophagus demonstrate no significant findings. Lungs/Pleura: Lungs are clear. No pleural effusion or pneumothorax. Musculoskeletal: No acute osseous abnormality. No aggressive osseous lesion. Review of the MIP images confirms the above findings. CT ABDOMEN and PELVIS FINDINGS Hepatobiliary: Diffuse low attenuation of the liver as can be seen with hepatic steatosis. No intrahepatic or extrahepatic biliary duct dilatation. No hepatic mass. High-density material within the gallbladder likely reflecting gallbladder sludge. Pancreas: Unremarkable. No pancreatic ductal dilatation or surrounding inflammatory changes. Spleen: Normal in size without focal abnormality. Adrenals/Urinary Tract: Normal adrenal  glands. Two 15 mm hypodense, fluid attenuating left renal masses consistent with cysts. Kidneys are otherwise unremarkable. No urolithiasis or obstructive uropathy. Stomach/Bowel: Stomach is within normal limits. Appendix appears normal. No evidence of bowel wall thickening, distention, or inflammatory changes. Vascular/Lymphatic: Normal caliber abdominal aorta with mild atherosclerosis. No lymphadenopathy. Reproductive: Prostate is unremarkable. Other: Small fat containing umbilical hernia.  No ascites. Musculoskeletal:  No acute osseous abnormality. No aggressive osseous lesion. Moderate facet arthropathy at L5-S1. Review of the MIP images confirms the above findings. IMPRESSION: 1. No evidence pulmonary embolus. 2. No acute abnormality of the chest, abdomen or pelvis. 3. Severe hepatic steatosis. 4.  Aortic Atherosclerosis (ICD10-I70.0). Electronically Signed   By: Kathreen Devoid   On: 10/29/2018 12:22   US Abdomen Limited  Result Date: 10/29/2018 CLINICAL DATA:  Vomiting.  Elevated liver function tests. EXAM: ULTRASOUND ABDOMEN LIMITED RIGHT UPPER QUADRANT COMPARISON:  None. FINDINGS: Gallbladder: No gallstones or wall thickening visualized. No sonographic Murphy sign noted by sonographer. Small amount of sludge in the gallbladder noted. Common bile duct: Diameter: 0.5 cm Liver: Echogenicity is increased consistent with fatty infiltration. No focal lesion. Portal vein is patent on color Doppler imaging with normal direction of blood flow towards the liver. IMPRESSION: Negative for gallstones or acute cholecystitis. Small volume of sludge in the gallbladder noted. Fatty infiltration of the liver. Electronically Signed   By: Inge Rise M.D.   On: 10/29/2018 14:48    EKG: Independently reviewed.  Normal sinus rhythm with nonspecific ST-T changes.    Assessment/Plan Active Problems:   Trigeminal neuralgia   Essential hypertension   Hypothyroidism   Intractable nausea and vomiting   Chest pain    Chronic heart failure with preserved ejection fraction (HFpEF) (HCC)   History of coronary artery disease   Dehydration   Nausea & vomiting   Elevated LFTs    1. Chest pain with nausea vomiting -cause not clear.  Patient was started on heparin in the ER.  I have stopped it because of frank hematuria.  Will continue antiplatelet agents beta-blockers and will get cardiology consult.  Patient had a similar picture in April 2020.  Check urine drug screen. 2. Hematuria -we will hold heparin.  And continue to observe.  May need urology consult if there is persistent hematuria. 3. Elevated LFTs -not sure if patient passed a stone.  But CT scan of the abdomen does not show any acute.  Abdomen appears nontender on exam.  Patient denies drinking alcohol recently.  Will follow LFTs INR.  Holding statin until LFTs improving. 4. Hypertension on amlodipine Cozaar and metoprolol.  Note that patient has chronic kidney disease and is on Cozaar. 5. Chronic disease stage III moderate patient is on Cozaar. 6. Thrombocytopenia appears to have worsened from previous.  Closely follow CBC.   DVT prophylaxis: SCDs due to hematuria. Code Status: Full code. Family Communication: Discussed with patient. Disposition Plan: Home. Consults called: Cardiology. Admission status: Observation.   Rise Patience MD [facility] Pager [phone].  If 7PM-7AM, please contact night-coverage [email or web address] Password [facility]  10/29/2018, 10:38 PM

## 2018-10-29 NOTE — ED Notes (Addendum)
Heparin order discontinued per EDP [place]. Infusion discontinued.

## 2018-10-29 NOTE — ED Notes (Signed)
Pt c/o sob with cp, and N/V that pt states started on Friday after working in yard. Pt verbalizes decreased appetite and episodes of vomiting after eating or drinking taht has continued to today. Pt vomited small amount in room when being assessed. Pt denies fever, denies abdominal pain

## 2018-10-29 NOTE — Progress Notes (Signed)
58 year old male history of coronary disease (troponins equivocal to negative x2 in the ER) who presents the emergency department complaining of chest pain and shortness of breath.  He worked in the yard for 3 days prior to waking up short of breath and could not do very much.  That was 3 days ago and he is still short of breath.  Full evaluation in the emergency department reveals elevated LFTs and a total bilirubin of 3.7.  Imaging significant for hepatic steatosis.  Right upper quadrant ultrasound unremarkable.  Cath in April and current troponins feel that his symptoms are unrelated to ACS per cardiology.  Patient referred to me for further evaluation and management.  He has multiple metabolic abnormalities that seem to point to a GI origin.  Possibility includes a passed gallstone but currently am unsure.  Also he has a 2-1 AST ALT ratio suggestive of alcohol use but denies any significant use.  Will observe overnight and hydrate and reassess.

## 2018-10-29 NOTE — ED Notes (Signed)
ED Provider at bedside. 

## 2018-10-29 NOTE — ED Notes (Signed)
Pt given ice chips

## 2018-10-29 NOTE — ED Notes (Signed)
Per Dr Evangeline Gula admitting Dr - order and consult with Pharmacist Hank, Heparin order followed.

## 2018-10-29 NOTE — ED Notes (Signed)
Pt c/o increased sob and chest pain after receiving nitro. Monitored pt, pt respirations WDL,. O2 sats 98%

## 2018-10-29 NOTE — ED Provider Notes (Signed)
[facility] EMERGENCY DEPARTMENT Provider Note   CSN: [id number] Arrival date & time: 10/29/18  0941    History   Chief Complaint Chief Complaint  Patient presents with   Shortness of Breath   Chest Pain    HPI [name] is a 58 y.o. male.     58yo M w/ PMH including CAD s/p stenting, CHF EF 45%, CKD, TIA, HTN, hypothyroidism who p/w SOB, chest pain, and vomiting.  Patient states that he worked in the yard last week every day for 3 days and was feeling fine while he was doing the work.  He woke up 3 days ago and began having shortness of breath with minimal exertion associated with nonradiating chest pain.  He states the symptoms are similar to previous heart attack requiring stenting.  He has had associated intractable nausea and vomiting had and has not been able to tolerate any p.o.  His chest pain has been relatively constant with worsening with any exertion.  Currently pain is 7/10 in intensity.  He denies any cough/cold symptoms, fevers, or sick contacts.  No lower extremity edema.  He has not been able to tolerate his medications for the past few days. No drug or alcohol use.  The history is provided by the patient.  Shortness of Breath Associated symptoms: chest pain   Chest Pain Associated symptoms: shortness of breath     Past Medical History:  Diagnosis Date   BPH (benign prostatic hyperplasia)    CAD (coronary artery disease)    a. NSTEMI troponin >65 with cath 11/2016 S/p PTCA & DES to mid circumflex. b. 05/2018 s/p DES to [facility].   Chronic chest pain    Chronic combined systolic and diastolic CHF (congestive heart failure) ([place])    a. EF 45% in 2018. b. EF 55-60% by echo 01/2017. c. EF 48% by nuc 07/2017.   CKD (chronic kidney disease), stage II    Cluster headache    hx of   Complication of anesthesia    difficult waking up"   Diverticulosis    Drug abuse ([place])    hx of   GERD (gastroesophageal reflux disease)    Hiatal hernia     Hyperlipidemia    Hypertension    Hypothyroidism    Nontoxic multinodular goiter    NSTEMI (non-ST elevated myocardial infarction) ([place])    Perforated nasal septum    Pulmonary nodule --- CT 10/19/2014: Nodule is stable, no further routine x-rays 01/06/2009   Thrombocytopenia (HCC)    TIA (transient ischemic attack)    Trigeminal neuralgia    Tubular adenoma of colon 12/2015   Unspecified asthma(493.90)     Patient Active Problem List   Diagnosis Date Noted   Lactic acidosis 07/23/2018   Status post coronary artery stent placement    History of coronary artery disease    Unstable angina ([place]) 06/08/2018   S/P angioplasty with stent    Chronic heart failure with preserved ejection fraction (HFpEF) ([place])    Situational depression 06/07/2018   Palpitations 05/28/2018   Chronic cough 05/27/2018   Gross hematuria 05/27/2018   Chest pain 07/22/2017   Ischemic cardiomyopathy 02/05/2017   Obesity 12/08/2016   Chronic diastolic CHF (congestive heart failure) ([place]) 12/08/2016   Atypical chest pain 12/07/2016   NSTEMI (non-ST elevated myocardial infarction) ([place]) 11/26/2016   Intractable nausea and vomiting 10/09/2016   Nausea vomiting and diarrhea 10/09/2016   Enteritis due to Clostridium difficile 10/30/2015   Malnutrition of moderate degree 10/21/2015  Acute kidney injury ([place]) 10/20/2015   PCP NOTES >>>>>>>>>>>>>>>>> 09/30/2015   Hypercholesteremia 09/22/2015   Renal mass, left 11/02/2014   CAD (coronary artery disease)    Drug abuse and dependence ([place]) 09/18/2014   Visit for preventive health examination 02/04/2014   Screening for ischemic heart disease 02/04/2014   BPH associated with nocturia 12/24/2013   Hypothyroidism 12/24/2013   Chronic ethmoidal sinusitis 12/24/2013   Low back pain radiating to right leg 05/10/2012   Thrombocytopenia ([place]) 12/05/2011   Tobacco use 11/30/2011   Anxiety 12/26/2010   Transient cerebral  ischemia 01/07/2010   Asthma 01/19/2009   Pulmonary nodule --- CT 10/19/2014: Nodule is stable, no further routine x-rays 01/06/2009   Nontoxic multinodular goiter 12/31/2008   DYSPNEA ON EXERTION 12/15/2008   Trigeminal neuralgia 07/24/2007   Dyslipidemia 12/27/2006   Cluster headache 12/27/2006   Essential hypertension 12/27/2006   GERD 12/27/2006    Past Surgical History:  Procedure Laterality Date   APPENDECTOMY     CARDIAC CATHETERIZATION N/A 11/25/2014   Procedure: Left Heart Cath and Coronary Angiography;  Surgeon: Belva Crome, MD;  Location: [facility];  Service: Cardiovascular;  Laterality: N/A;   CARDIAC CATHETERIZATION  12/08/2008   Archie Endo 08/24/2010   COLECTOMY  ~ 1976   "I had a blockage"   CORONARY STENT INTERVENTION N/A 11/27/2016   Procedure: CORONARY STENT INTERVENTION;  Surgeon: Belva Crome, MD;  Location: [place] [facility] LAB;  Service: Cardiovascular;  Laterality: N/A;   CORONARY STENT INTERVENTION N/A 06/10/2018   Procedure: CORONARY STENT INTERVENTION;  Surgeon: Nelva Bush, MD;  Location: [place] [facility];  Service: Cardiovascular;  Laterality: N/A;   CORONARY STENT PLACEMENT  11/27/2016   STENT XIENCE ALPINE RX 3.0X15 drug eluting stent was successfully placed   CYST EXCISION Left 10/31/2016   Jaw   DESTRUCTION TRIGEMINAL NERVE VIA NEUROLYTIC AGENT  x 3 , R side last ~2010   KNEE ARTHROSCOPY Right 06/26/2000   Arthroscopy followed by open lateral release/notes 09/06/2010   LEFT HEART CATH AND CORONARY ANGIOGRAPHY N/A 11/27/2016   Procedure: LEFT HEART CATH AND CORONARY ANGIOGRAPHY;  Surgeon: Belva Crome, MD;  Location: [place] [facility];  Service: Cardiovascular;  Laterality: N/A;   LEFT HEART CATH AND CORONARY ANGIOGRAPHY N/A 12/07/2016   Procedure: LEFT HEART CATH AND CORONARY ANGIOGRAPHY;  Surgeon: Nelva Bush, MD;  Location: [place] [facility] LAB;  Service: Cardiovascular;  Laterality: N/A;   LEFT HEART CATH AND  CORONARY ANGIOGRAPHY N/A 06/10/2018   Procedure: LEFT HEART CATH AND CORONARY ANGIOGRAPHY;  Surgeon: Nelva Bush, MD;  Location: [facility];  Service: Cardiovascular;  Laterality: N/A;   LEFT HEART CATH AND CORONARY ANGIOGRAPHY N/A 07/24/2018   Procedure: LEFT HEART CATH AND CORONARY ANGIOGRAPHY;  Surgeon: Belva Crome, MD;  Location: [facility];  Service: Cardiovascular;  Laterality: N/A;   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 Ardine Bjork 09/06/2010   SHOULDER HEMI-ARTHROPLASTY Right 05/26/2010   revision/notes 05/27/2010        Home Medications    Prior to Admission medications   Medication Sig Start Date End Date Taking? Authorizing Provider  amLODipine (NORVASC) 10 MG tablet Take 1 tablet (10 mg total) by mouth daily. 06/07/18   [name], DO  aspirin EC 81 MG tablet Take 81 mg by mouth daily.    [provider]  clopidogrel (PLAVIX) 75 MG tablet Take 75 mg by mouth daily.    [provider]  Evolocumab (REPATHA SURECLICK) 099 MG/ML SOAJ Inject 140 mg into the skin every 14 (fourteen) days. 09/12/18   Dorothy Spark, MD  ezetimibe (ZETIA) 10 MG tablet Take 1 tablet (10 mg total) by mouth daily. 05/27/18   [name], DO  FLUoxetine (PROZAC) 20 MG tablet Take 20 mg by mouth daily.    [provider]  fluticasone (FLONASE) 50 MCG/ACT nasal spray Place 2 sprays into both nostrils daily. 05/27/18   [name], DO  lansoprazole (PREVACID) 30 MG capsule Take 1 capsule (30 mg total) by mouth 2 (two) times daily before a meal. 05/27/18   Wendling, Crosby Oyster, DO  levocetirizine (XYZAL) 5 MG tablet Take 1 tablet (5 mg total) by mouth every evening. 05/27/18   [name], DO  losartan (COZAAR) 100 MG tablet Take 100 mg by mouth daily.    [provider]  metoprolol succinate (TOPROL-XL) 50 MG 24 hr tablet Take 1 tablet (50 mg total) by mouth daily. Take with or  immediately following a meal. 10/09/18   Wendling, Crosby Oyster, DO  nitroGLYCERIN (NITROSTAT) 0.4 MG SL tablet Place 1 tablet (0.4 mg total) under the tongue every 5 (five) minutes as needed for chest pain. 05/28/18   Imogene Burn, PA-C  Polyethyl Glycol-Propyl Glycol (SYSTANE OP) Place 1 drop into both eyes every 3 (three) hours as needed (dry eyes).     [provider]  promethazine (PHENERGAN) 12.5 MG tablet Take 12.5 mg by mouth every 6 (six) hours as needed for nausea or vomiting.    [provider]  rosuvastatin (CRESTOR) 40 MG tablet Take 1 tablet (40 mg total) by mouth at bedtime. 06/11/18   Cheryln Manly, NP  traZODone (DESYREL) 50 MG tablet Take 0.5-1 tablets (25-50 mg total) by mouth at bedtime as needed for sleep. 09/25/18   [name], DO    Family History Family History  Problem Relation Age of Onset   Diabetes Mother    Hyperlipidemia Mother    Hypertension Mother    Hypertension Father    Heart disease Father    Cancer Father    Sudden death Father        OCT 08/10/09   CAD Father    Heart failure Father    Mesothelioma Father    Heart disease Maternal Aunt    Prostate cancer Neg Hx    Colon cancer Neg Hx     Social History Social History   Tobacco Use   Smoking status: Former Smoker    Packs/day: 0.25    Years: 12.00    Pack years: 3.00    Types: Cigarettes    Quit date: 11/15/2016    Years since quitting: 1.9   Smokeless tobacco: Never Used  Substance Use Topics   Alcohol use: Yes    Comment: occ   Drug use: No     Allergies   Tramadol   Review of Systems Review of Systems  Respiratory: Positive for shortness of breath.   Cardiovascular: Positive for chest pain.   All other systems reviewed and are negative except that which was mentioned in HPI   Physical Exam Updated Vital Signs BP 106/78    Pulse 97    Temp 98.2 F (36.8 C) (Oral)    Resp 19    Ht 5\' 10"  (1.778 m)    Wt 105.2 kg    SpO2  95%    BMI 33.29 kg/m   Physical Exam Vitals signs and  nursing note reviewed.  Constitutional:      General: He is not in acute distress.    Appearance: He is well-developed.     Comments: Uncomfortable, dyspneic  HENT:     Head: Normocephalic and atraumatic.  Eyes:     Conjunctiva/sclera: Conjunctivae normal.  Neck:     Musculoskeletal: Neck supple.  Cardiovascular:     Rate and Rhythm: Regular rhythm. Tachycardia present.     Heart sounds: Normal heart sounds. No murmur.  Pulmonary:     Effort: Pulmonary effort is normal. Tachypnea present.     Breath sounds: Normal breath sounds. No wheezing or rales.  Abdominal:     General: Bowel sounds are normal. There is no distension.     Palpations: Abdomen is soft.     Tenderness: There is no abdominal tenderness.  Musculoskeletal:     Right lower leg: No edema.     Left lower leg: No edema.  Skin:    General: Skin is warm and dry.  Neurological:     Mental Status: He is alert and oriented to person, place, and time.     Comments: Fluent speech  Psychiatric:        Judgment: Judgment normal.      ED Treatments / Results  Labs (all labs ordered are listed, but only abnormal results are displayed) Labs Reviewed  COMPREHENSIVE METABOLIC PANEL - Abnormal; Notable for the following components:      Result Value   Sodium 132 (*)    Chloride 90 (*)    CO2 11 (*)    Glucose, Bld 116 (*)    BUN 23 (*)    Creatinine, Ser 1.44 (*)    Total Protein 9.6 (*)    AST 227 (*)    ALT 125 (*)    Total Bilirubin 3.7 (*)    GFR calc non Af Amer 54 (*)    Anion gap 31 (*)    All other components within normal limits  TROPONIN I (HIGH SENSITIVITY) - Abnormal; Notable for the following components:   Troponin I (High Sensitivity) 20 (*)    All other components within normal limits  TROPONIN I (HIGH SENSITIVITY) - Abnormal; Notable for the following components:   Troponin I (High Sensitivity) 29 (*)    All other components within  normal limits  CBC WITH DIFFERENTIAL/PLATELET - Abnormal; Notable for the following components:   MCH 34.7 (*)    Platelets 85 (*)    nRBC 0.6 (*)    All other components within normal limits  D-DIMER, QUANTITATIVE (NOT AT [facility]) - Abnormal; Notable for the following components:   D-Dimer, Quant 3.89 (*)    All other components within normal limits  SARS CORONAVIRUS 2 (HOSP ORDER, PERFORMED IN [facility] VIA ABBOTT ID)  BRAIN NATRIURETIC PEPTIDE  BASIC METABOLIC PANEL  APTT  PROTIME-INR    EKG EKG Interpretation  Date/Time:  Tuesday October 29 2018 10:05:52 EDT Ventricular Rate:  117 PR Interval:    QRS Duration: 88 QT Interval:  294 QTC Calculation: 411 R Axis:   109 Text Interpretation:  Sinus tachycardia Probable left atrial enlargement Right axis deviation Borderline T wave abnormalities Baseline wander in lead(s) II III aVR aVL aVF rate faster, otherwise similar to previous Confirmed by Theotis Burrow [phone]) on 10/29/2018 10:14:14 AM   Radiology Dg Chest 2 View  Result Date: 10/29/2018 CLINICAL DATA:  Shortness of breath EXAM: CHEST - 2 VIEW COMPARISON:  July 23, 2018 FINDINGS: There is  slight bibasilar atelectasis. There is no edema or consolidation. The heart size and pulmonary vascularity are normal. No adenopathy. 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 IMPRESSION: Slight bibasilar atelectasis.  No edema or consolidation. Electronically Signed   By: Lowella Grip III M.D.   On: 10/29/2018 11:07   Ct Angio Chest Pe W/cm &/or Wo Cm  Result Date: 10/29/2018 CLINICAL DATA:  Chest pain, shortness of breath, tachycardia, no abdominal pain EXAM: CT ANGIOGRAPHY CHEST CT ABDOMEN AND PELVIS WITH CONTRAST TECHNIQUE: Multidetector CT imaging of the chest was performed using the standard protocol during bolus administration of intravenous contrast. Multiplanar CT image reconstructions and MIPs were obtained to evaluate the vascular anatomy. Multidetector CT  imaging of the abdomen and pelvis was performed using the standard protocol during bolus administration of intravenous contrast. CONTRAST:  162mL OMNIPAQUE IOHEXOL 350 MG/ML SOLN COMPARISON:  None. FINDINGS: CTA CHEST FINDINGS Cardiovascular: Satisfactory opacification of the pulmonary arteries to the segmental level. No evidence of pulmonary embolism. Normal heart size. No pericardial effusion. Thoracic aorta is normal in caliber. No thoracic aortic dissection. Mediastinum/Nodes: No enlarged mediastinal, hilar, or axillary lymph nodes. Thyroid gland, trachea, and esophagus demonstrate no significant findings. Lungs/Pleura: Lungs are clear. No pleural effusion or pneumothorax. Musculoskeletal: No acute osseous abnormality. No aggressive osseous lesion. Review of the MIP images confirms the above findings. CT ABDOMEN and PELVIS FINDINGS Hepatobiliary: Diffuse low attenuation of the liver as can be seen with hepatic steatosis. No intrahepatic or extrahepatic biliary duct dilatation. No hepatic mass. High-density material within the gallbladder likely reflecting gallbladder sludge. Pancreas: Unremarkable. No pancreatic ductal dilatation or surrounding inflammatory changes. Spleen: Normal in size without focal abnormality. Adrenals/Urinary Tract: Normal adrenal glands. Two 15 mm hypodense, fluid attenuating left renal masses consistent with cysts. Kidneys are otherwise unremarkable. No urolithiasis or obstructive uropathy. Stomach/Bowel: Stomach is within normal limits. Appendix appears normal. No evidence of bowel wall thickening, distention, or inflammatory changes. Vascular/Lymphatic: Normal caliber abdominal aorta with mild atherosclerosis. No lymphadenopathy. Reproductive: Prostate is unremarkable. Other: Small fat containing umbilical hernia.  No ascites. Musculoskeletal: No acute osseous abnormality. No aggressive osseous lesion. Moderate facet arthropathy at L5-S1. Review of the MIP images confirms the above  findings. IMPRESSION: 1. No evidence pulmonary embolus. 2. No acute abnormality of the chest, abdomen or pelvis. 3. Severe hepatic steatosis. 4.  Aortic Atherosclerosis (ICD10-I70.0). Electronically Signed   By: Kathreen Devoid   On: 10/29/2018 12:22   Ct Abdomen Pelvis W Contrast  Result Date: 10/29/2018 CLINICAL DATA:  Chest pain, shortness of breath, tachycardia, no abdominal pain EXAM: CT ANGIOGRAPHY CHEST CT ABDOMEN AND PELVIS WITH CONTRAST TECHNIQUE: Multidetector CT imaging of the chest was performed using the standard protocol during bolus administration of intravenous contrast. Multiplanar CT image reconstructions and MIPs were obtained to evaluate the vascular anatomy. Multidetector CT imaging of the abdomen and pelvis was performed using the standard protocol during bolus administration of intravenous contrast. CONTRAST:  173mL OMNIPAQUE IOHEXOL 350 MG/ML SOLN COMPARISON:  None. FINDINGS: CTA CHEST FINDINGS Cardiovascular: Satisfactory opacification of the pulmonary arteries to the segmental level. No evidence of pulmonary embolism. Normal heart size. No pericardial effusion. Thoracic aorta is normal in caliber. No thoracic aortic dissection. Mediastinum/Nodes: No enlarged mediastinal, hilar, or axillary lymph nodes. Thyroid gland, trachea, and esophagus demonstrate no significant findings. Lungs/Pleura: Lungs are clear. No pleural effusion or pneumothorax. Musculoskeletal: No acute osseous abnormality. No aggressive osseous lesion. Review of the MIP images confirms the above findings. CT ABDOMEN and PELVIS  FINDINGS Hepatobiliary: Diffuse low attenuation of the liver as can be seen with hepatic steatosis. No intrahepatic or extrahepatic biliary duct dilatation. No hepatic mass. High-density material within the gallbladder likely reflecting gallbladder sludge. Pancreas: Unremarkable. No pancreatic ductal dilatation or surrounding inflammatory changes. Spleen: Normal in size without focal abnormality.  Adrenals/Urinary Tract: Normal adrenal glands. Two 15 mm hypodense, fluid attenuating left renal masses consistent with cysts. Kidneys are otherwise unremarkable. No urolithiasis or obstructive uropathy. Stomach/Bowel: Stomach is within normal limits. Appendix appears normal. No evidence of bowel wall thickening, distention, or inflammatory changes. Vascular/Lymphatic: Normal caliber abdominal aorta with mild atherosclerosis. No lymphadenopathy. Reproductive: Prostate is unremarkable. Other: Small fat containing umbilical hernia.  No ascites. Musculoskeletal: No acute osseous abnormality. No aggressive osseous lesion. Moderate facet arthropathy at L5-S1. Review of the MIP images confirms the above findings. IMPRESSION: 1. No evidence pulmonary embolus. 2. No acute abnormality of the chest, abdomen or pelvis. 3. Severe hepatic steatosis. 4.  Aortic Atherosclerosis (ICD10-I70.0). Electronically Signed   By: Kathreen Devoid   On: 10/29/2018 12:22   US Abdomen Limited  Result Date: 10/29/2018 CLINICAL DATA:  Vomiting.  Elevated liver function tests. EXAM: ULTRASOUND ABDOMEN LIMITED RIGHT UPPER QUADRANT COMPARISON:  None. FINDINGS: Gallbladder: No gallstones or wall thickening visualized. No sonographic Murphy sign noted by sonographer. Small amount of sludge in the gallbladder noted. Common bile duct: Diameter: 0.5 cm Liver: Echogenicity is increased consistent with fatty infiltration. No focal lesion. Portal vein is patent on color Doppler imaging with normal direction of blood flow towards the liver. IMPRESSION: Negative for gallstones or acute cholecystitis. Small volume of sludge in the gallbladder noted. Fatty infiltration of the liver. Electronically Signed   By: Inge Rise M.D.   On: 10/29/2018 14:48    Procedures Procedures (including critical care time)  Medications Ordered in ED Medications  nitroGLYCERIN (NITROSTAT) SL tablet 0.4 mg (0.4 mg Sublingual Given 10/29/18 1041)  heparin ADULT infusion  100 units/mL (25000 units/240mL sodium chloride 0.45%) (has no administration in time range)  heparin bolus via infusion 4,000 Units (has no administration in time range)  metoCLOPramide (REGLAN) injection 10 mg (10 mg Intravenous Given 10/29/18 1019)  aspirin chewable tablet 324 mg (324 mg Oral Given 10/29/18 1114)  sodium chloride 0.9 % bolus 500 mL (0 mLs Intravenous Stopped 10/29/18 1231)  fentaNYL (SUBLIMAZE) injection 100 mcg (100 mcg Intravenous Given 10/29/18 1122)  sodium chloride 0.9 % bolus 500 mL (0 mLs Intravenous Stopped 10/29/18 1409)  iohexol (OMNIPAQUE) 350 MG/ML injection 100 mL (100 mLs Intravenous Contrast Given 10/29/18 1156)  sodium chloride 0.9 % bolus 1,000 mL (1,000 mLs Intravenous New Bag/Given 10/29/18 1409)     Initial Impression / Assessment and Plan / ED Course  I have reviewed the triage vital signs and the nursing notes.  Pertinent labs & imaging results that were available during my care of the patient were reviewed by me and considered in my medical decision making (see chart for details).       He was uncomfortable and tachycardic on exam, mildly elevated BP, O2 saturation mid 90s on room air.  He was dyspneic but clear breath sounds. EKG without acute ischemic changes. DDx includes NSTEMI, PE, CHF exacerbation.   Lab work shows sodium 132, chloride 90, bicarb 11, glucose 116, BUN 23, creatinine 1.44, anion gap 31 which I feel is likely due to dehydration.  Initial HS troponin 20, PLT 85.  D-dimer elevated therefore obtain CTA of chest to rule out PE.  His LFTs  are also elevated at AST 227, ALT 125, total bilirubin 3.7, therefore added abdomen pelvis to evaluate liver and gallbladder.  Imaging notable only for severe hepatic steatosis.  Because of his significantly elevated LFTs and bilirubin, held off on initiating heparin while pending right upper quadrant ultrasound to r/o cholecystitis as this would potentially make patient surgical candidate. [place] negative. Repeat HS  trop 29. discussed w/ cardiology, Dr. Marlou Porch, who felt that given recent cath in April and current trops/EKG, his symptoms were unlikely to be related to ACS. HE recommended obs with trending troponins and working up non-cardiac causes.  I have placed a page to the hospitalist and am awaiting callback.  Patient signed out to oncoming provider pending admission to [facility].  Final Clinical Impressions(s) / ED Diagnoses   Final diagnoses:  None    ED Discharge Orders    None       Jasmene Goswami, Wenda Overland, MD 10/29/18 1542

## 2018-10-29 NOTE — ED Notes (Signed)
Patient transported to X-ray and CT 

## 2018-10-29 NOTE — Progress Notes (Signed)
ANTICOAGULATION CONSULT NOTE - Initial Consult  Pharmacy Consult for heparin Indication: chest pain/ACS  Allergies  Allergen Reactions  . Tramadol Palpitations    Patient Measurements: Height: 5\' 10"  (177.8 cm) Weight: 232 lb (105.2 kg) IBW/kg (Calculated) : 73 Heparin Dosing Weight: 95kg   Vital Signs: Temp: 98.2 F (36.8 C) (07/07 0952) Temp Source: Oral (07/07 0952) BP: 106/78 (07/07 1400) Pulse Rate: 97 (07/07 1400)  Labs: Recent Labs    10/29/18 1003 10/29/18 1137  HGB 16.4  --   HCT 46.9  --   PLT 85*  --   CREATININE 1.44*  --   TROPONINIHS 20* 29*    Estimated Creatinine Clearance: 70.4 mL/min (A) (by C-G formula based on SCr of 1.44 mg/dL (H)).   Medical History: Past Medical History:  Diagnosis Date  . BPH (benign prostatic hyperplasia)   . CAD (coronary artery disease)    a. NSTEMI troponin >65 with cath 11/2016 S/p PTCA & DES to mid circumflex. b. 05/2018 s/p DES to [facility].  Marland Kitchen Chronic chest pain   . Chronic combined systolic and diastolic CHF (congestive heart failure) ([place])    a. EF 45% in 2018. b. EF 55-60% by echo 01/2017. c. EF 48% by nuc 07/2017.  . CKD (chronic kidney disease), stage II   . Cluster headache    hx of  . Complication of anesthesia    difficult waking up"  . Diverticulosis   . Drug abuse ([place])    hx of  . GERD (gastroesophageal reflux disease)   . Hiatal hernia   . Hyperlipidemia   . Hypertension   . Hypothyroidism   . Nontoxic multinodular goiter   . NSTEMI (non-ST elevated myocardial infarction) ([place])   . Perforated nasal septum   . Pulmonary nodule --- CT 10/19/2014: Nodule is stable, no further routine x-rays 01/06/2009  . Thrombocytopenia ([place])   . TIA (transient ischemic attack)   . Trigeminal neuralgia   . Tubular adenoma of colon 12/2015  . Unspecified asthma(493.90)     Medications:  (Not in a hospital admission)   Assessment: ACS Goal of Therapy:  Heparin level 0.3-0.7 units/ml Monitor platelets by  anticoagulation protocol: Yes   Plan:  Give 4000 units bolus x 1 Start heparin infusion at 1350 units/hr Check anti-Xa level in 6 hours and daily while on heparin Continue to monitor H&H and platelets  Mallie Mussel A Davante Gerke 10/29/2018,3:03 PM

## 2018-10-29 NOTE — ED Notes (Signed)
1 nitro administered per Dr Rex Kras.

## 2018-10-29 NOTE — ED Triage Notes (Addendum)
Pt c/o heart palpitations and sob that increases with activity. Denies fever, also c/o CP when activity. Pt has hx of MI. Pt c/o N/V, and decreased appeatite. Pt states he vomits when eating or drinking.

## 2018-10-29 NOTE — Progress Notes (Deleted)
[place] for heparin Indication: chest pain/ACS  Allergies  Allergen Reactions  . Tramadol Palpitations    Patient Measurements: Height: 5\' 10"  (177.8 cm) Weight: 226 lb 6.6 oz (102.7 kg) IBW/kg (Calculated) : 73 Heparin Dosing Weight: 95kg   Vital Signs: BP: 114/76 (07/07 2200) Pulse Rate: 88 (07/07 2200)  Labs: Recent Labs    10/29/18 1003 10/29/18 1137 10/29/18 1520 10/29/18 1550 10/29/18 1642 10/29/18 2236  HGB 16.4  --   --   --   --   --   HCT 46.9  --   --   --   --   --   PLT 85*  --   --   --   --   --   APTT  --   --   --  27  --   --   LABPROT  --   --   --  14.9  --   --   INR  --   --   --  1.2  --   --   HEPARINUNFRC  --   --   --   --   --  0.53  CREATININE 1.44*  --  1.72*  --   --   --   CKTOTAL  --   --   --   --  241  --   TROPONINIHS 20* 29*  --   --   --   --     Estimated Creatinine Clearance: 58.3 mL/min (A) (by C-G formula based on SCr of 1.72 mg/dL (H)).   Assessment: 58 y.o. male with chest pain for heparin  Goal of Therapy:  Heparin level 0.3-0.7 units/ml Monitor platelets by anticoagulation protocol: Yes   Plan:  Continue Heparin at current rate Follow-up am labs.  Caryl Pina 10/29/2018,11:58 PM

## 2018-10-30 ENCOUNTER — Observation Stay (HOSPITAL_COMMUNITY): Payer: 59

## 2018-10-30 DIAGNOSIS — R945 Abnormal results of liver function studies: Secondary | ICD-10-CM

## 2018-10-30 DIAGNOSIS — G500 Trigeminal neuralgia: Secondary | ICD-10-CM

## 2018-10-30 DIAGNOSIS — R0789 Other chest pain: Secondary | ICD-10-CM

## 2018-10-30 DIAGNOSIS — I200 Unstable angina: Secondary | ICD-10-CM

## 2018-10-30 DIAGNOSIS — R310 Gross hematuria: Secondary | ICD-10-CM | POA: Diagnosis not present

## 2018-10-30 DIAGNOSIS — I5032 Chronic diastolic (congestive) heart failure: Secondary | ICD-10-CM

## 2018-10-30 DIAGNOSIS — E860 Dehydration: Secondary | ICD-10-CM

## 2018-10-30 DIAGNOSIS — Z8679 Personal history of other diseases of the circulatory system: Secondary | ICD-10-CM

## 2018-10-30 DIAGNOSIS — E039 Hypothyroidism, unspecified: Secondary | ICD-10-CM

## 2018-10-30 DIAGNOSIS — R112 Nausea with vomiting, unspecified: Secondary | ICD-10-CM | POA: Diagnosis not present

## 2018-10-30 LAB — URINE CULTURE: Culture: NO GROWTH

## 2018-10-30 LAB — BASIC METABOLIC PANEL
Anion gap: 19 — ABNORMAL HIGH (ref 5–15)
BUN: 23 mg/dL — ABNORMAL HIGH (ref 6–20)
CO2: 15 mmol/L — ABNORMAL LOW (ref 22–32)
Calcium: 9.2 mg/dL (ref 8.9–10.3)
Chloride: 97 mmol/L — ABNORMAL LOW (ref 98–111)
Creatinine, Ser: 1.76 mg/dL — ABNORMAL HIGH (ref 0.61–1.24)
GFR calc Af Amer: 49 mL/min — ABNORMAL LOW (ref >60)
GFR calc non Af Amer: 43 mL/min — ABNORMAL LOW (ref >60)
Glucose, Bld: 135 mg/dL — ABNORMAL HIGH (ref 70–99)
Potassium: 3.3 mmol/L — ABNORMAL LOW (ref 3.5–5.1)
Sodium: 131 mmol/L — ABNORMAL LOW (ref 135–145)

## 2018-10-30 LAB — CBC
HCT: 39.4 % (ref 39.0–52.0)
HCT: 40.4 % (ref 39.0–52.0)
Hemoglobin: 14 g/dL (ref 13.0–17.0)
Hemoglobin: 14.7 g/dL (ref 13.0–17.0)
MCH: 34.7 pg — ABNORMAL HIGH (ref 26.0–34.0)
MCH: 34.7 pg — ABNORMAL HIGH (ref 26.0–34.0)
MCHC: 35.5 g/dL (ref 30.0–36.0)
MCHC: 36.4 g/dL — ABNORMAL HIGH (ref 30.0–36.0)
MCV: 97.8 fL (ref 80.0–100.0)
MCV: 95.3 fL (ref 80.0–100.0)
Platelets: 62 10*3/uL — ABNORMAL LOW (ref 150–400)
Platelets: 55 10*3/uL — ABNORMAL LOW (ref 150–400)
RBC: 4.03 MIL/uL — ABNORMAL LOW (ref 4.22–5.81)
RBC: 4.24 MIL/uL (ref 4.22–5.81)
RDW: 14.6 % (ref 11.5–15.5)
RDW: 14.6 % (ref 11.5–15.5)
WBC: 6 10*3/uL (ref 4.0–10.5)
WBC: 6.7 10*3/uL (ref 4.0–10.5)
nRBC: 1 % — ABNORMAL HIGH (ref 0.0–0.2)
nRBC: 1 % — ABNORMAL HIGH (ref 0.0–0.2)

## 2018-10-30 LAB — CBC WITH DIFFERENTIAL/PLATELET
Abs Immature Granulocytes: 0.03 10*3/uL (ref 0.00–0.07)
Basophils Absolute: 0 10*3/uL (ref 0.0–0.1)
Basophils Relative: 1 %
Eosinophils Absolute: 0 10*3/uL (ref 0.0–0.5)
Eosinophils Relative: 0 %
HCT: 38.6 % — ABNORMAL LOW (ref 39.0–52.0)
Hemoglobin: 13.8 g/dL (ref 13.0–17.0)
Immature Granulocytes: 1 %
Lymphocytes Relative: 20 %
Lymphs Abs: 1.2 10*3/uL (ref 0.7–4.0)
MCH: 34.8 pg — ABNORMAL HIGH (ref 26.0–34.0)
MCHC: 35.8 g/dL (ref 30.0–36.0)
MCV: 97.2 fL (ref 80.0–100.0)
Monocytes Absolute: 0.7 10*3/uL (ref 0.1–1.0)
Monocytes Relative: 12 %
Neutro Abs: 4.1 10*3/uL (ref 1.7–7.7)
Neutrophils Relative %: 66 %
Platelets: 67 10*3/uL — ABNORMAL LOW (ref 150–400)
RBC: 3.97 MIL/uL — ABNORMAL LOW (ref 4.22–5.81)
RDW: 14.5 % (ref 11.5–15.5)
WBC: 6.1 10*3/uL (ref 4.0–10.5)
nRBC: 1 % — ABNORMAL HIGH (ref 0.0–0.2)

## 2018-10-30 LAB — LACTIC ACID, PLASMA
Lactic Acid, Venous: 1.3 mmol/L (ref 0.5–1.9)
Lactic Acid, Venous: 1.2 mmol/L (ref 0.5–1.9)

## 2018-10-30 LAB — RAPID URINE DRUG SCREEN, HOSP PERFORMED
Amphetamines: NOT DETECTED
Amphetamines: NOT DETECTED
Barbiturates: NOT DETECTED
Barbiturates: NOT DETECTED
Benzodiazepines: NOT DETECTED
Benzodiazepines: NOT DETECTED
Cocaine: NOT DETECTED
Cocaine: NOT DETECTED
Opiates: NOT DETECTED
Opiates: NOT DETECTED
Tetrahydrocannabinol: NOT DETECTED
Tetrahydrocannabinol: NOT DETECTED

## 2018-10-30 LAB — GAMMA GT: GGT: 1216 U/L — ABNORMAL HIGH (ref 7–50)

## 2018-10-30 LAB — HEPATITIS PANEL, ACUTE
HCV Ab: <0.1 s/co ratio (ref 0.0–0.9)
Hep A IgM: NEGATIVE
Hep B C IgM: NEGATIVE
Hepatitis B Surface Ag: NEGATIVE

## 2018-10-30 LAB — HEPATIC FUNCTION PANEL
ALT: 92 U/L — ABNORMAL HIGH (ref 0–44)
AST: 177 U/L — ABNORMAL HIGH (ref 15–41)
Albumin: 3.6 g/dL (ref 3.5–5.0)
Alkaline Phosphatase: 53 U/L (ref 38–126)
Bilirubin, Direct: 1.2 mg/dL — ABNORMAL HIGH (ref 0.0–0.2)
Indirect Bilirubin: 2 mg/dL — ABNORMAL HIGH (ref 0.3–0.9)
Total Bilirubin: 3.2 mg/dL — ABNORMAL HIGH (ref 0.3–1.2)
Total Protein: 7.3 g/dL (ref 6.5–8.1)

## 2018-10-30 LAB — TYPE AND SCREEN
ABO/RH(D): B POS
Antibody Screen: NEGATIVE

## 2018-10-30 LAB — ACETAMINOPHEN LEVEL: Acetaminophen (Tylenol), Serum: <10 ug/mL — ABNORMAL LOW (ref 10–30)

## 2018-10-30 MED ORDER — CHLORHEXIDINE GLUCONATE 0.12 % MT SOLN
15.0000 mL | Freq: Two times a day (BID) | OROMUCOSAL | Status: DC
  Administered 2018-10-31 – 2018-11-01 (×2): 15 mL via OROMUCOSAL
  Filled 2018-10-30 (×3): qty 15

## 2018-10-30 MED ORDER — LORAZEPAM 2 MG/ML IJ SOLN
1.0000 mg | Freq: Once | INTRAMUSCULAR | Status: AC
  Administered 2018-10-30: 12:00:00 1 mg via INTRAVENOUS
  Filled 2018-10-30: qty 1

## 2018-10-30 MED ORDER — POTASSIUM CHLORIDE CRYS ER 20 MEQ PO TBCR
40.0000 meq | EXTENDED_RELEASE_TABLET | Freq: Once | ORAL | Status: AC
  Administered 2018-10-30: 40 meq via ORAL
  Filled 2018-10-30: qty 2

## 2018-10-30 MED ORDER — ORAL CARE MOUTH RINSE
15.0000 mL | Freq: Two times a day (BID) | OROMUCOSAL | Status: DC
  Administered 2018-10-31 – 2018-11-01 (×2): 15 mL via OROMUCOSAL

## 2018-10-30 NOTE — Progress Notes (Addendum)
PROGRESS NOTE    [name]  HGD:[id number] DOB: 10-Jul-1960 DOA: 10/29/2018 PCP: [name], DO   Brief Narrative:  On 10/29/2018 by Dr. Gean Birchwood [name] is a 58 y.o. male with history of CAD status post ending in February 2020, hypertension, chronic disease stage III, drug abuse, trigeminal neuralgia, hyperlipidemia hypothyroidism presents to the ER at [facility] with complaints of persistent chest pain with nausea vomiting over the last 3 days.  Patient had a similar picture and presentation in April of this year when cardiac cath was done and was unremarkable.  Patient stated chest pain is retrosternal stabbing in nature nonradiating present even at rest increased on exertion.  No associated shortness of breath.  Nausea vomiting is persistent has no relation to food and no blood in the vomitus.  Had noted some blood in the urine. Assessment & Plan   Chest pain -Atypical, has been ongoing since Saturday, 10/26/2018 -Has had intermittent nausea and vomiting -Troponin currently cycled, 20, 29-appears flat -Echocardiogram 06/10/2018: EF of 60 to 65%.  LV diastolic Doppler parameters consistent with impaired relaxation. (Grade 1 diastolic dysfunction) -Patient did have heart catheterization 07/24/2018 which showed short, widely patent left main.  Large LAD, irregularities up to 30% proximal.  Widely patent circumflex with mid vessel 20 to 30% eccentric narrowing.  EF 50%, low normal LV systolic function -CTA shows no evidence of pulmonary embolus  Nausea and vomiting -possibly secondary to the above vs GI source -Continue antiemetics PRN, IVF  Elevated FLTs/Bilirubin -Hepatitis panel unremarkable -INR 1.2 -Abdominal ultrasound: negative for gallstones or acute cholecystitis.  Small volume of sludge in the gallbladder.  Fatty infiltration of liver. -CT abdomen pelvis showed no acute abnormality of the chest, abdomen, pelvis.  Severe hepatic steatosis.   No intra-or extrahepatic biliary duct dilatation. -AST up to 227, ALT 125 on admission -Possibly due to dehydration from nausea and vomiting -Continue to monitor CMP and give IV fluids -Discussed in passing with Gastroenterology PA- will obtain MRCP. She recommended surgical consult  Hematuria -Unknown cause -Hemoglobin currently stable, 14.7 -UA unremarkable for infection -CT abdomen pelvis with contrast showed 215 mm hypodense fluid attenuating left renal masses consistent with cysts.  Kidneys are otherwise unremarkable.  No urolithiasis or obstructive uropathy. -Continue to monitor  Thrombocytopenia -Chronic, however worsening -Platelets currently 62, however appears that baseline has been low 100s -Continue to monitor   Acute kidney injury vs CKD, stage II -Creatinine up to 1.76 today -Baseline creatinine has ranged from approximately 1.1 up to 1.4 -Continue IV fluids and monitor BMP  Essential hypertension -Continue amlodipine, metoprolol -Hold Cozaar  DVT Prophylaxis  SCDs  Code Status: Full  Family Communication: None at bedside  Disposition Plan: Observation. Feel patient will require additional hospital stay given LFTs, ongoing workup for chest pain. Home when stable.   Consultants Cardiology  Procedures  Abdominal US  Antibiotics   Anti-infectives (From admission, onward)   None      Subjective:   [name] seen and examined today.  Continues to have intermittent left-sided chest pain with nausea.  No current vomiting.  Denies shortness of breath, abdominal pain, dizziness or headache, diarrhea or constipation.  Patient does report seeing blood in his urine prior to admission.  Objective:   Vitals:   10/29/18 2200 10/30/18 0006 10/30/18 0345 10/30/18 0916  BP: 114/76 114/85 103/83 (!) 136/95  Pulse: 88 86 86 78  Resp: 14 20 13 18   Temp:  98.1 F (36.7 C) 98.7  F (37.1 C) 98 F (36.7 C)  TempSrc:   Oral Oral  SpO2: 98% 98% 98% 99%    Weight:      Height:        Intake/Output Summary (Last 24 hours) at 10/30/2018 0935 Last data filed at 10/30/2018 0500 Gross per 24 hour  Intake 2192.59 ml  Output 325 ml  Net 1867.59 ml   Filed Weights   10/29/18 0954 10/29/18 2100  Weight: 105.2 kg 102.7 kg    Exam  General: Well developed, well nourished, NAD, appears stated age  34: NCAT, mucous membranes moist.   Neck: Supple  Cardiovascular: S1 S2 auscultated, RRR, no murmur  Respiratory: Clear to auscultation bilaterally with equal chest rise  Abdomen: Soft, nontender, mildly distended, + bowel sounds  Extremities: warm dry without cyanosis clubbing  Neuro: AAOx3, nonfocal  Psych: Appropriate mood and affect, pleasant    Data Reviewed: I have personally reviewed following labs and imaging studies  CBC: Recent Labs  Lab 10/29/18 1003 10/30/18 0037 10/30/18 0557 10/30/18 0838  WBC 6.7 6.1 6.0 6.7  NEUTROABS 4.7 4.1  --   --   HGB 16.4 13.8 14.0 14.7  HCT 46.9 38.6* 39.4 40.4  MCV 99.2 97.2 97.8 95.3  PLT 85* 67* 62* 55*   Basic Metabolic Panel: Recent Labs  Lab 10/29/18 1003 10/29/18 1520 10/30/18 0037  NA 132* 132* 131*  K 4.1 4.0 3.3*  CL 90* 96* 97*  CO2 11* 13* 15*  GLUCOSE 116* 115* 135*  BUN 23* 27* 23*  CREATININE 1.44* 1.72* 1.76*  CALCIUM 10.3 8.8* 9.2   GFR: Estimated Creatinine Clearance: 56.9 mL/min (A) (by C-G formula based on SCr of 1.76 mg/dL (H)). Liver Function Tests: Recent Labs  Lab 10/29/18 1003 10/30/18 0037  AST 227* 177*  ALT 125* 92*  ALKPHOS 70 53  BILITOT 3.7* 3.2*  PROT 9.6* 7.3  ALBUMIN 5.0 3.6   No results for input(s): LIPASE, AMYLASE in the last 168 hours. No results for input(s): AMMONIA in the last 168 hours. Coagulation Profile: Recent Labs  Lab 10/29/18 1550  INR 1.2   Cardiac Enzymes: Recent Labs  Lab 10/29/18 1642  CKTOTAL 241   BNP (last 3 results) No results for input(s): PROBNP in the last 8760 hours. HbA1C: No results for  input(s): HGBA1C in the last 72 hours. CBG: No results for input(s): GLUCAP in the last 168 hours. Lipid Profile: No results for input(s): CHOL, HDL, LDLCALC, TRIG, CHOLHDL, LDLDIRECT in the last 72 hours. Thyroid Function Tests: No results for input(s): TSH, T4TOTAL, FREET4, T3FREE, THYROIDAB in the last 72 hours. Anemia Panel: No results for input(s): VITAMINB12, FOLATE, FERRITIN, TIBC, IRON, RETICCTPCT in the last 72 hours. Urine analysis:    Component Value Date/Time   COLORURINE BROWN (A) 10/29/2018 1707   APPEARANCEUR HAZY (A) 10/29/2018 1707   LABSPEC 1.015 10/29/2018 1707   PHURINE 6.0 10/29/2018 1707   GLUCOSEU NEGATIVE 10/29/2018 1707   GLUCOSEU NEGATIVE 05/27/2018 1421   HGBUR MODERATE (A) 10/29/2018 1707   BILIRUBINUR MODERATE (A) 10/29/2018 1707   BILIRUBINUR negative 02/23/2017 1539   KETONESUR 40 (A) 10/29/2018 1707   PROTEINUR 100 (A) 10/29/2018 1707   UROBILINOGEN 0.2 05/27/2018 1421   NITRITE NEGATIVE 10/29/2018 1707   LEUKOCYTESUR NEGATIVE 10/29/2018 1707   Sepsis Labs: @LABRCNTIP (procalcitonin:4,lacticidven:4)  ) Recent Results (from the past 240 hour(s))  SARS Coronavirus 2 (Hosp order,Performed in [facility] lab via [facility] ID)     Status: None   Collection Time:  10/29/18 11:36 AM   Specimen: Dry Nasal Swab ([facility] ID Now)  Result Value Ref Range Status   SARS Coronavirus 2 ([employer] ID Now) NEGATIVE NEGATIVE Final    Comment: (NOTE) SARS-CoV-2 target nucleic acids are NOT DETECTED. The SARS-CoV-2 RNA is generally detectable in upper and lower respiratory specimens during the acute phase of infection.  Negativeresults do not preclude SARS-CoV-2 infection, do not rule out coinfections with other pathogens, and should not be used as the  sole basis for treatment or other patient management decisions.  Negative results must be combined with clinical observations, patient history, and epidemiological information. The expected result is Negative. Fact  Sheet for Patients: GolfingFamily.no Fact Sheet for Healthcare Providers: https://www.hernandez-brewer.com/ This test is not yet approved or cleared by the [place] FDA and  has been authorized for detection and/or diagnosis of SARS-CoV-2 by FDA under an Emergency Use Authorization (EUA).  This EUA will remain in effect (meaning this test can be used) for the duration of  the COVID19 declaration under Section 5 64(b)(1) of the Act, 21 U.S.C.  section 3255000010 3(b)(1), unless the authorization is terminated or revoked sooner. Performed at [facility], [address] [place], [place] [address]   MRSA PCR Screening     Status: None   Collection Time: 10/29/18  8:07 PM   Specimen: Nasal Mucosa; Nasopharyngeal  Result Value Ref Range Status   MRSA by PCR NEGATIVE NEGATIVE Final    Comment:        The GeneXpert MRSA Assay (FDA approved for NASAL specimens only), is one component of a comprehensive MRSA colonization surveillance program. It is not intended to diagnose MRSA infection nor to guide or monitor treatment for MRSA infections. Performed at [facility], [place] [address], [place], [place] [address]       Radiology Studies: Dg Chest 2 View  Result Date: 10/29/2018 CLINICAL DATA:  Shortness of breath EXAM: CHEST - 2 VIEW COMPARISON:  July 23, 2018 FINDINGS: There is slight bibasilar atelectasis. There is no edema or consolidation. The heart size and pulmonary vascularity are normal. No adenopathy. 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 IMPRESSION: Slight bibasilar atelectasis.  No edema or consolidation. Electronically Signed   By: Lowella Grip III M.D.   On: 10/29/2018 11:07   Ct Angio Chest Pe W/cm &/or Wo Cm  Result Date: 10/29/2018 CLINICAL DATA:  Chest pain, shortness of breath, tachycardia, no abdominal pain EXAM: CT ANGIOGRAPHY CHEST CT ABDOMEN AND PELVIS WITH CONTRAST TECHNIQUE:  Multidetector CT imaging of the chest was performed using the standard protocol during bolus administration of intravenous contrast. Multiplanar CT image reconstructions and MIPs were obtained to evaluate the vascular anatomy. Multidetector CT imaging of the abdomen and pelvis was performed using the standard protocol during bolus administration of intravenous contrast. CONTRAST:  173mL OMNIPAQUE IOHEXOL 350 MG/ML SOLN COMPARISON:  None. FINDINGS: CTA CHEST FINDINGS Cardiovascular: Satisfactory opacification of the pulmonary arteries to the segmental level. No evidence of pulmonary embolism. Normal heart size. No pericardial effusion. Thoracic aorta is normal in caliber. No thoracic aortic dissection. Mediastinum/Nodes: No enlarged mediastinal, hilar, or axillary lymph nodes. Thyroid gland, trachea, and esophagus demonstrate no significant findings. Lungs/Pleura: Lungs are clear. No pleural effusion or pneumothorax. Musculoskeletal: No acute osseous abnormality. No aggressive osseous lesion. Review of the MIP images confirms the above findings. CT ABDOMEN and PELVIS FINDINGS Hepatobiliary: Diffuse low attenuation of the liver as can be seen with hepatic steatosis. No intrahepatic  or extrahepatic biliary duct dilatation. No hepatic mass. High-density material within the gallbladder likely reflecting gallbladder sludge. Pancreas: Unremarkable. No pancreatic ductal dilatation or surrounding inflammatory changes. Spleen: Normal in size without focal abnormality. Adrenals/Urinary Tract: Normal adrenal glands. Two 15 mm hypodense, fluid attenuating left renal masses consistent with cysts. Kidneys are otherwise unremarkable. No urolithiasis or obstructive uropathy. Stomach/Bowel: Stomach is within normal limits. Appendix appears normal. No evidence of bowel wall thickening, distention, or inflammatory changes. Vascular/Lymphatic: Normal caliber abdominal aorta with mild atherosclerosis. No lymphadenopathy. Reproductive:  Prostate is unremarkable. Other: Small fat containing umbilical hernia.  No ascites. Musculoskeletal: No acute osseous abnormality. No aggressive osseous lesion. Moderate facet arthropathy at L5-S1. Review of the MIP images confirms the above findings. IMPRESSION: 1. No evidence pulmonary embolus. 2. No acute abnormality of the chest, abdomen or pelvis. 3. Severe hepatic steatosis. 4.  Aortic Atherosclerosis (ICD10-I70.0). Electronically Signed   By: Kathreen Devoid   On: 10/29/2018 12:22   Ct Abdomen Pelvis W Contrast  Result Date: 10/29/2018 CLINICAL DATA:  Chest pain, shortness of breath, tachycardia, no abdominal pain EXAM: CT ANGIOGRAPHY CHEST CT ABDOMEN AND PELVIS WITH CONTRAST TECHNIQUE: Multidetector CT imaging of the chest was performed using the standard protocol during bolus administration of intravenous contrast. Multiplanar CT image reconstructions and MIPs were obtained to evaluate the vascular anatomy. Multidetector CT imaging of the abdomen and pelvis was performed using the standard protocol during bolus administration of intravenous contrast. CONTRAST:  142mL OMNIPAQUE IOHEXOL 350 MG/ML SOLN COMPARISON:  None. FINDINGS: CTA CHEST FINDINGS Cardiovascular: Satisfactory opacification of the pulmonary arteries to the segmental level. No evidence of pulmonary embolism. Normal heart size. No pericardial effusion. Thoracic aorta is normal in caliber. No thoracic aortic dissection. Mediastinum/Nodes: No enlarged mediastinal, hilar, or axillary lymph nodes. Thyroid gland, trachea, and esophagus demonstrate no significant findings. Lungs/Pleura: Lungs are clear. No pleural effusion or pneumothorax. Musculoskeletal: No acute osseous abnormality. No aggressive osseous lesion. Review of the MIP images confirms the above findings. CT ABDOMEN and PELVIS FINDINGS Hepatobiliary: Diffuse low attenuation of the liver as can be seen with hepatic steatosis. No intrahepatic or extrahepatic biliary duct dilatation. No  hepatic mass. High-density material within the gallbladder likely reflecting gallbladder sludge. Pancreas: Unremarkable. No pancreatic ductal dilatation or surrounding inflammatory changes. Spleen: Normal in size without focal abnormality. Adrenals/Urinary Tract: Normal adrenal glands. Two 15 mm hypodense, fluid attenuating left renal masses consistent with cysts. Kidneys are otherwise unremarkable. No urolithiasis or obstructive uropathy. Stomach/Bowel: Stomach is within normal limits. Appendix appears normal. No evidence of bowel wall thickening, distention, or inflammatory changes. Vascular/Lymphatic: Normal caliber abdominal aorta with mild atherosclerosis. No lymphadenopathy. Reproductive: Prostate is unremarkable. Other: Small fat containing umbilical hernia.  No ascites. Musculoskeletal: No acute osseous abnormality. No aggressive osseous lesion. Moderate facet arthropathy at L5-S1. Review of the MIP images confirms the above findings. IMPRESSION: 1. No evidence pulmonary embolus. 2. No acute abnormality of the chest, abdomen or pelvis. 3. Severe hepatic steatosis. 4.  Aortic Atherosclerosis (ICD10-I70.0). Electronically Signed   By: Kathreen Devoid   On: 10/29/2018 12:22   US Abdomen Limited  Result Date: 10/29/2018 CLINICAL DATA:  Vomiting.  Elevated liver function tests. EXAM: ULTRASOUND ABDOMEN LIMITED RIGHT UPPER QUADRANT COMPARISON:  None. FINDINGS: Gallbladder: No gallstones or wall thickening visualized. No sonographic Murphy sign noted by sonographer. Small amount of sludge in the gallbladder noted. Common bile duct: Diameter: 0.5 cm Liver: Echogenicity is increased consistent with fatty infiltration. No focal lesion. Portal vein is patent on color  Doppler imaging with normal direction of blood flow towards the liver. IMPRESSION: Negative for gallstones or acute cholecystitis. Small volume of sludge in the gallbladder noted. Fatty infiltration of the liver. Electronically Signed   By: Inge Rise M.D.   On: 10/29/2018 14:48     Scheduled Meds:  amLODipine  10 mg Oral Daily   aspirin EC  81 mg Oral Daily   clopidogrel  75 mg Oral Daily   ezetimibe  10 mg Oral Daily   FLUoxetine  20 mg Oral Daily   losartan  100 mg Oral Daily   mouth rinse  15 mL Mouth Rinse BID   metoprolol succinate  50 mg Oral Daily   pantoprazole  20 mg Oral Daily   Continuous Infusions:  sodium chloride 100 mL/hr at 10/29/18 2346     LOS: 0 days   Time Spent in minutes   45 minutes  Priyanka Causey D.O. on 10/30/2018 at 9:35 AM  Between 7am to 7pm - Please see pager noted on [email or web address]  After 7pm go to [email or web address]  And look for the night coverage person covering for me after hours  [facility] Office  [phone]

## 2018-10-30 NOTE — Progress Notes (Signed)
Pt rec'd from EMS via stretcher, moved to bed without assist. Pt A&O x 4, oriented to room and unit. Pt verbalizes understanding of pain scale, reports CP 7/10. Charge RN paged MD for RX, see MAR for intervention. Pt agrees to inform staff if pain worsens, or if diaphoresis or SOB manifest. Pt acknowledges need to inform staff before getting OOB. Plan of care discussed, questions answered to pt satisfaction.

## 2018-10-30 NOTE — Consult Note (Addendum)
Cardiology Consultation:   Patient ID: [name] MRN: [id number]; DOB: November 23, 1960  Admit date: 10/29/2018 Date of Consult: 10/30/2018  Primary Care Provider: [name], DO Primary Cardiologist: Ena Dawley, MD  Primary Electrophysiologist:  None    Patient Profile:   [name] is a 58 y.o. male with a hx of CAD s/p STEMI in 2018 w/ LCx PCI and recent PCI to the mid RCA 05/2018, issues with atypical CP since cath with repeat cath in April showing patent stents and nonobstructive CAD, HFpEF and DLD who is being seen today for the evaluation of recurrent CP at the request of Dr. Ree Kida, Internal Medicine.   History of Present Illness:   58 y.o.malewith history ofHTN, Chronic HFpEF, DLD,CAD s/p and STEMI treated with DES to circumflex 11/2016 with irregularityin the LAD and RCA but no obstructive lesions, repeat cath 1 week later showed patent stent, follow-up echo 01/2017 LVEF improved to 55 to 60%, chest pain 07/2017 Lexiscan Myoview no ischemia LVEF 48% with fixed perfusion defect of the inferior wall stable.  He was admitted again, this past February, for CP and had ischemic w/u. He ruled out for acute MI with negative cardiac enzymes in Feb but had had an abnormal NST 06/05/18 that was an intermediate risk study, showing  small moderate partially reversible defect in the anteroapex that goes to mild and small moderate partially reversible defect in the basal inferolateral wall that goes to mild.  The suggest possible peri-infarct ischemia.  There is a reversible mild defect in the inferoapex that returns to normal.  This suggested ischemia. Subsequent LHC on 06/10/18 showed significant two-vessel coronary artery disease involving mid RCA (75%) and inferior branch of OM2 (70%), moderate, non-obstructive LAD disease, widely patent OM2 stent and ormal left ventricular filling pressure. He underwent successful PCI to mid RCA using Resolute Onyx 3.5 x 15 mm DES with 0%  residual stenosis and TIMI-3 flow. He was placed on DAPT w/ ASA and Plavix. Echo that admit showed normal LVEF w/ G1DD and no significant valvular abnormalties. In addition to DAPT, he was also discharged home on Crestor, Zetia, Coreg, Ranexa, amlodipine and Losartan.    He presented back to the hospital several weeks later on 3/31 w/ recurrent CP and intractable nausea. In the ED, he was noted to be hypertensive w/ BP as high as 165/112 and mildly tachycardic. EKG showed normal sinus rhythm with new T wave inversions in anterior leads. I-stat troponin negative. Chest x-ray showed no acute findings. Labs were c/w metabolic acidosis, which improved w/ hydration. Given his recent PCI and recurrent CP, he was referred for repeat LHC on 07/24/18 which showed widely patent stents in the RCA and LCx and no obstructive CAD. His CP was ruled at that time as nonischemic.   He had a telehealth visit in May w/ Dr. Meda Coffee and continued to endorse similar, atypical CP, not related to exertion. She had concerns for possible acute pericarditis and started him on colchicine BID x 3 months. No ibuprofen given he was on DAPT. He had 1 month f/u again in June and noted no significant improvement w/ colchicine and continued to endorse CP. Given persistent symptoms, a cardiac MRI was recommended to exclude infiltrative disease, however this has not yet been performed.   Pt now presents back to [facility] with recurrent CP. Has been admitted by IM. Came in on 7/7. Complained of persistent CP w/ nausea x 3 ays. Pain described as retrosternal, and stabbing in nature but some occasional  pressure. Non radiating. Associated exertional dyspnea. He gets SOB walking short distances and has to stop and rest. has been mostly compliant w/ DAPT but admits that he missed a dose this past Saturday due to persistent n/v. Chest CT negative for PE. Labs notable for elevated LFTs and bilirubin. AST up to 227, ALT 125 on admission. Abdominal ultrasound  negative for gallstones or acute cholecystitis.  Small volume of sludge in the gallbladder.  Fatty infiltration of liver. CT abdomen pelvis showed no acute abnormality of the chest, abdomen, pelvis.  Severe hepatic steatosis.  No intra-or extrahepatic biliary duct dilatation. GI has been consulted. Plan is for MRCP and may need surgical consult. Cardiology also re consulted for CP. Hs Troponin is mildly elevated but low level at 20>>29. UDS negative. Admits to drinking alcohol. A few mixed drinks a week. COVID negative. EKG shows sinus tachycardia, 112 bpm, probable LAE, no significant ST abnormalties   Heart Pathway Score:     Past Medical History:  Diagnosis Date   BPH (benign prostatic hyperplasia)    CAD (coronary artery disease)    a. NSTEMI troponin >65 with cath 11/2016 S/p PTCA & DES to mid circumflex. b. 05/2018 s/p DES to [facility].   Chronic chest pain    Chronic combined systolic and diastolic CHF (congestive heart failure) ([place])    a. EF 45% in 2018. b. EF 55-60% by echo 01/2017. c. EF 48% by nuc 07/2017.   CKD (chronic kidney disease), stage II    Cluster headache    hx of   Complication of anesthesia    difficult waking up"   Diverticulosis    Drug abuse (HCC)    hx of   GERD (gastroesophageal reflux disease)    Hiatal hernia    Hyperlipidemia    Hypertension    Hypothyroidism    Nontoxic multinodular goiter    NSTEMI (non-ST elevated myocardial infarction) ([place])    Perforated nasal septum    Pulmonary nodule --- CT 10/19/2014: Nodule is stable, no further routine x-rays 01/06/2009   Thrombocytopenia (HCC)    TIA (transient ischemic attack)    Trigeminal neuralgia    Tubular adenoma of colon 12/2015   Unspecified asthma(493.90)     Past Surgical History:  Procedure Laterality Date   APPENDECTOMY     CARDIAC CATHETERIZATION N/A 11/25/2014   Procedure: Left Heart Cath and Coronary Angiography;  Surgeon: Belva Crome, MD;  Location: [place] [facility];  Service: Cardiovascular;  Laterality: N/A;   CARDIAC CATHETERIZATION  12/08/2008   Archie Endo 08/24/2010   COLECTOMY  ~ 1976   "I had a blockage"   CORONARY STENT INTERVENTION N/A 11/27/2016   Procedure: CORONARY STENT INTERVENTION;  Surgeon: Belva Crome, MD;  Location: [place] [facility];  Service: Cardiovascular;  Laterality: N/A;   CORONARY STENT INTERVENTION N/A 06/10/2018   Procedure: CORONARY STENT INTERVENTION;  Surgeon: Nelva Bush, MD;  Location: [place] [facility];  Service: Cardiovascular;  Laterality: N/A;   CORONARY STENT PLACEMENT  11/27/2016   STENT XIENCE ALPINE RX 3.0X15 drug eluting stent was successfully placed   CYST EXCISION Left 10/31/2016   Jaw   DESTRUCTION TRIGEMINAL NERVE VIA NEUROLYTIC AGENT  x 3 , R side last ~2010   KNEE ARTHROSCOPY Right 06/26/2000   Arthroscopy followed by open lateral release/notes 09/06/2010   LEFT HEART CATH AND CORONARY ANGIOGRAPHY N/A 11/27/2016   Procedure: LEFT HEART CATH AND CORONARY ANGIOGRAPHY;  Surgeon: Belva Crome, MD;  Location: [place]  [facility];  Service: Cardiovascular;  Laterality: N/A;   LEFT HEART CATH AND CORONARY ANGIOGRAPHY N/A 12/07/2016   Procedure: LEFT HEART CATH AND CORONARY ANGIOGRAPHY;  Surgeon: Nelva Bush, MD;  Location: [place] [facility];  Service: Cardiovascular;  Laterality: N/A;   LEFT HEART CATH AND CORONARY ANGIOGRAPHY N/A 06/10/2018   Procedure: LEFT HEART CATH AND CORONARY ANGIOGRAPHY;  Surgeon: Nelva Bush, MD;  Location: [place] [facility];  Service: Cardiovascular;  Laterality: N/A;   LEFT HEART CATH AND CORONARY ANGIOGRAPHY N/A 07/24/2018   Procedure: LEFT HEART CATH AND CORONARY ANGIOGRAPHY;  Surgeon: Belva Crome, MD;  Location: [place] [facility];  Service: Cardiovascular;  Laterality: N/A;   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 Ardine Bjork 09/06/2010   SHOULDER HEMI-ARTHROPLASTY Right 05/26/2010   revision/notes 05/27/2010     Home Medications:    Prior to Admission medications   Medication Sig Start Date End Date Taking? Authorizing Provider  amLODipine (NORVASC) 10 MG tablet Take 1 tablet (10 mg total) by mouth daily. 06/07/18  Yes [name], DO  aspirin EC 81 MG tablet Take 81 mg by mouth daily.   Yes [provider]  clopidogrel (PLAVIX) 75 MG tablet Take 75 mg by mouth daily.   Yes [provider]  Evolocumab (REPATHA SURECLICK) 751 MG/ML SOAJ Inject 140 mg into the skin every 14 (fourteen) days. 09/12/18  Yes Dorothy Spark, MD  ezetimibe (ZETIA) 10 MG tablet Take 1 tablet (10 mg total) by mouth daily. 05/27/18  Yes [name], DO  FLUoxetine (PROZAC) 20 MG tablet Take 20 mg by mouth daily.   Yes [provider]  fluticasone (FLONASE) 50 MCG/ACT nasal spray Place 2 sprays into both nostrils daily. 05/27/18  Yes [name], DO  lansoprazole (PREVACID) 30 MG capsule Take 1 capsule (30 mg total) by mouth 2 (two) times daily before a meal. 05/27/18  Yes Wendling, Crosby Oyster, DO  levocetirizine (XYZAL) 5 MG tablet Take 1 tablet (5 mg total) by mouth every evening. 05/27/18  Yes [name], DO  losartan (COZAAR) 100 MG tablet Take 100 mg by mouth daily.   Yes [provider]  metoprolol succinate (TOPROL-XL) 50 MG 24 hr tablet Take 1 tablet (50 mg total) by mouth daily. Take with or immediately following a meal. 10/09/18  Yes Wendling, Crosby Oyster, DO  nitroGLYCERIN (NITROSTAT) 0.4 MG SL tablet Place 1 tablet (0.4 mg total) under the tongue every 5 (five) minutes as needed for chest pain. 05/28/18  Yes Imogene Burn, PA-C  Polyethyl Glycol-Propyl Glycol (SYSTANE OP) Place 1 drop into both eyes every 3 (three) hours as needed (dry eyes).    Yes [provider]  rosuvastatin (CRESTOR) 40 MG tablet Take 1 tablet (40 mg total) by mouth at bedtime. 06/11/18  Yes Cheryln Manly, NP  traZODone (DESYREL) 50 MG tablet Take 0.5-1 tablets (25-50 mg  total) by mouth at bedtime as needed for sleep. 09/25/18  Yes [name], DO  promethazine (PHENERGAN) 12.5 MG tablet Take 12.5 mg by mouth every 6 (six) hours as needed for nausea or vomiting.    [provider]    Inpatient Medications: Scheduled Meds:  amLODipine  10 mg Oral Daily   aspirin EC  81 mg Oral Daily   clopidogrel  75 mg Oral Daily   ezetimibe  10 mg Oral Daily   FLUoxetine  20 mg Oral Daily   losartan  100 mg Oral Daily  mouth rinse  15 mL Mouth Rinse BID   metoprolol succinate  50 mg Oral Daily   pantoprazole  20 mg Oral Daily   Continuous Infusions:  sodium chloride 100 mL/hr at 10/29/18 2346   PRN Meds: fentaNYL (SUBLIMAZE) injection, nitroGLYCERIN, ondansetron **OR** ondansetron (ZOFRAN) IV, traZODone  Allergies:    Allergies  Allergen Reactions   Tramadol Palpitations    Social History:   Social History   Socioeconomic History   Marital status: Married    Spouse name: Not on file   Number of children: 2   Years of education: Not on file   Highest education level: Not on file  Occupational History   Occupation: Merchandiser, retail [employer] resource strain: Not hard at all   Food insecurity    Worry: Never true    Inability: Never true   Transportation needs    Medical: No    Non-medical: No  Tobacco Use   Smoking status: Former Smoker    Packs/day: 0.25    Years: 12.00    Pack years: 3.00    Types: Cigarettes    Quit date: 11/15/2016    Years since quitting: 1.9   Smokeless tobacco: Never Used  Substance and Sexual Activity   Alcohol use: Yes    Alcohol/week: 1.0 standard drinks    Types: 1 Shots of liquor per week    Comment: occ   Drug use: No   Sexual activity: Yes  Lifestyle   Physical activity    Days per week: 3 days    Minutes per session: 30 min   Stress: Only a little  Relationships   Social connections    Talks on phone: More than three times a  week    Gets together: Three times a week    Attends religious service: More than 4 times per year    Active member of club or organization: No    Attends meetings of clubs or organizations: Not on file    Relationship status: Married   Intimate partner violence    Fear of current or ex partner: No    Emotionally abused: No    Physically abused: No    Forced sexual activity: No  Other Topics Concern   Not on file  Social History Narrative   HSG   Culinary school in [place]   Married - August 12, 1982 - 5 years divorced; remarried '96   1 son - 1983/08/12              Family History:    Family History  Problem Relation Age of Onset   Diabetes Mother    Hyperlipidemia Mother    Hypertension Mother    Hypertension Father    Heart disease Father    Cancer Father    Sudden death Father        OCT 08/11/09   CAD Father    Heart failure Father    Mesothelioma Father    Heart disease Maternal Aunt    Prostate cancer Neg Hx    Colon cancer Neg Hx      ROS:  Please see the history of present illness.   All other ROS reviewed and negative.     Physical Exam/Data:   Vitals:   10/30/18 0006 10/30/18 0345 10/30/18 0916 10/30/18 1048  BP: 114/85 103/83 (!) 136/95 136/88  Pulse: 86 86 78 72  Resp: 20 13 18 16   Temp: 98.1 F (36.7 C) 98.7 F (37.1  C) 98 F (36.7 C) 98.2 F (36.8 C)  TempSrc:  Oral Oral Oral  SpO2: 98% 98% 99% 97%  Weight:      Height:        Intake/Output Summary (Last 24 hours) at 10/30/2018 1239 Last data filed at 10/30/2018 0500 Gross per 24 hour  Intake 1692.58 ml  Output 325 ml  Net 1367.58 ml   Last 3 Weights 10/29/2018 10/29/2018 10/17/2018  Weight (lbs) 226 lb 6.6 oz 232 lb 234 lb  Weight (kg) 102.7 kg 105.235 kg 106.142 kg     Body mass index is 32.49 kg/m.  General:  Moderately obese middle aged male, well nourished, well developed, in no acute distress HEENT: normal Lymph: no adenopathy Neck: no JVD Endocrine:  No thryomegaly Vascular: No  carotid bruits; FA pulses 2+ bilaterally without bruits  Cardiac:  normal S1, S2; RRR; no murmur Lungs:  clear to auscultation bilaterally, no wheezing, rhonchi or rales  Abd: obese abdomen, mildly distended but nontender Ext: no edema Musculoskeletal:  No deformities, BUE and BLE strength normal and equal Skin: warm and dry  Neuro:  CNs 2-12 intact, no focal abnormalities noted Psych:  Normal affect   EKG:  The EKG was personally reviewed and demonstrates:  Sinus tachycardia, 112 bpm, probable LAE, no significant ST abnormalties  Telemetry:  Telemetry was personally reviewed and demonstrates:  NSR no arrhthymias.   Relevant CV Studies: NST 06/05/18  Study Highlights    The left ventricular ejection fraction is moderately decreased (30-44%).  Nuclear stress EF: 43%.  Blood pressure demonstrated a hypertensive response to stress.  There was no ST segment deviation noted during stress.  Defect 1: There is a small defect of mild severity present in the apical inferior and apex location.  Defect 2: There is a small defect of moderate severity present in the apical anterior and apex location.  Defect 3: There is a small defect of moderate severity present in the basal inferolateral location.  Findings consistent with ischemia and prior myocardial infarction with peri-infarct ischemia.  This is an intermediate risk study.   Ejection fraction is reduced with global hypokinesis.  There is a small moderate partially reversible defect in the anteroapex that goes to mild and small moderate partially reversible defect in the basal inferolateral wall that goes to mild.  The suggest possible peri-infarct ischemia.  There is a reversible mild defect in the inferoapex that returns to normal.  This suggests ischemia.  Prior exam noted in medical record documentation is not available for review.   2D Echo 06/10/18  IMPRESSIONS    1. The left ventricle has normal systolic function with an  ejection fraction of 60-65%. The cavity size was normal. There is mildly increased left ventricular wall thickness. Left ventricular diastolic Doppler parameters are consistent with impaired  relaxation Indeterminent filling pressures The E/e' is 8-15.  2. The right ventricle has normal systolic function. The cavity was normal. There is no increase in right ventricular wall thickness.  3. The mitral valve is normal in structure.  4. The tricuspid valve is normal in structure.  5. The aortic valve is normal in structure.  6. The aortic root and ascending aorta are normal in size and structure.  7. The interatrial septum was not well visualized.  8. When compared to the prior study: Compared to a prior study in 01/2017, there are no new changes.  LHC 06/10/18 LEFT HEART CATH AND CORONARY ANGIOGRAPHY W/ INTERVENTION   Conclusion  Conclusions: 1.  Significant two-vessel coronary artery disease involving mid RCA (75%) and inferior branch of OM2 (70%). 2. Moderate, non-obstructive LAD disease. 3. Widely patent OM2 stent. 4. Normal left ventricular filling pressure. 5. Successful PCI to mid RCA using Resolute Onyx 3.5 x 15 mm DES with 0% residual stenosis and TIMI-3 flow.  Recommendations: 1. Continue dual antiplatelet therapy with aspirin and clopidogrel for at least 12 months, ideally longer. 2. Wean nitroglycerin infusion as tolerated. If patient has recurrent chest pain refractory to antianginal therapy, PCI to inferior branch of OM2 could be considered (this is a small vessel). 3. Aggressive secondary prevention. 4. Follow-up echo for evaluation of left ventricular systolic function.     DIAGNOSTIC ONLY LEFT HEART CATH AND CORONARY ANGIOGRAPHY 07/24/2018  Conclusion   Stable coronary anatomy when compared to post PCI images performed in February 2020.  Short, widely patent left main.  Large LAD that wraps around the left ventricular apex.  Irregularities up to 30%  proximal.  Widely patent circumflex with mid vessel 20 to 30% eccentric narrowing, and widely patent stent and the dominant obtuse marginal.  Small branch of the distal circumflex contains 70% narrowing and is stable over time.  Widely patent right coronary including wide patency of the mid to distal stent placed in February 2020.  Low normal LV systolic function with EF 50%.  Normal filling pressures.  RECOMMENDATIONS:   IV nitroglycerin was discontinued.  Consider alternative explanations for the patient's chest pain.     Laboratory Data:  High Sensitivity Troponin:   Recent Labs  Lab 10/29/18 1003 10/29/18 1137  TROPONINIHS 20* 29*     Cardiac EnzymesNo results for input(s): TROPONINI in the last 168 hours. No results for input(s): TROPIPOC in the last 168 hours.  Chemistry Recent Labs  Lab 10/29/18 1003 10/29/18 1520 10/30/18 0037  NA 132* 132* 131*  K 4.1 4.0 3.3*  CL 90* 96* 97*  CO2 11* 13* 15*  GLUCOSE 116* 115* 135*  BUN 23* 27* 23*  CREATININE 1.44* 1.72* 1.76*  CALCIUM 10.3 8.8* 9.2  GFRNONAA 54* 44* 43*  GFRAA >60 51* 49*  ANIONGAP 31* 23* 19*    Recent Labs  Lab 10/29/18 1003 10/30/18 0037  PROT 9.6* 7.3  ALBUMIN 5.0 3.6  AST 227* 177*  ALT 125* 92*  ALKPHOS 70 53  BILITOT 3.7* 3.2*   Hematology Recent Labs  Lab 10/30/18 0037 10/30/18 0557 10/30/18 0838  WBC 6.1 6.0 6.7  RBC 3.97* 4.03* 4.24  HGB 13.8 14.0 14.7  HCT 38.6* 39.4 40.4  MCV 97.2 97.8 95.3  MCH 34.8* 34.7* 34.7*  MCHC 35.8 35.5 36.4*  RDW 14.5 14.6 14.6  PLT 67* 62* 55*   BNP Recent Labs  Lab 10/29/18 1003  BNP 37.0    DDimer  Recent Labs  Lab 10/29/18 1003  DDIMER 3.89*     Radiology/Studies:  Dg Chest 2 View  Result Date: 10/29/2018 CLINICAL DATA:  Shortness of breath EXAM: CHEST - 2 VIEW COMPARISON:  July 23, 2018 FINDINGS: There is slight bibasilar atelectasis. There is no edema or consolidation. The heart size and pulmonary vascularity are normal.  No adenopathy. 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 IMPRESSION: Slight bibasilar atelectasis.  No edema or consolidation. Electronically Signed   By: Lowella Grip III M.D.   On: 10/29/2018 11:07   Ct Angio Chest Pe W/cm &/or Wo Cm  Result Date: 10/29/2018 CLINICAL DATA:  Chest pain, shortness of breath, tachycardia, no abdominal pain EXAM:  CT ANGIOGRAPHY CHEST CT ABDOMEN AND PELVIS WITH CONTRAST TECHNIQUE: Multidetector CT imaging of the chest was performed using the standard protocol during bolus administration of intravenous contrast. Multiplanar CT image reconstructions and MIPs were obtained to evaluate the vascular anatomy. Multidetector CT imaging of the abdomen and pelvis was performed using the standard protocol during bolus administration of intravenous contrast. CONTRAST:  166mL OMNIPAQUE IOHEXOL 350 MG/ML SOLN COMPARISON:  None. FINDINGS: CTA CHEST FINDINGS Cardiovascular: Satisfactory opacification of the pulmonary arteries to the segmental level. No evidence of pulmonary embolism. Normal heart size. No pericardial effusion. Thoracic aorta is normal in caliber. No thoracic aortic dissection. Mediastinum/Nodes: No enlarged mediastinal, hilar, or axillary lymph nodes. Thyroid gland, trachea, and esophagus demonstrate no significant findings. Lungs/Pleura: Lungs are clear. No pleural effusion or pneumothorax. Musculoskeletal: No acute osseous abnormality. No aggressive osseous lesion. Review of the MIP images confirms the above findings. CT ABDOMEN and PELVIS FINDINGS Hepatobiliary: Diffuse low attenuation of the liver as can be seen with hepatic steatosis. No intrahepatic or extrahepatic biliary duct dilatation. No hepatic mass. High-density material within the gallbladder likely reflecting gallbladder sludge. Pancreas: Unremarkable. No pancreatic ductal dilatation or surrounding inflammatory changes. Spleen: Normal in size without focal abnormality. Adrenals/Urinary  Tract: Normal adrenal glands. Two 15 mm hypodense, fluid attenuating left renal masses consistent with cysts. Kidneys are otherwise unremarkable. No urolithiasis or obstructive uropathy. Stomach/Bowel: Stomach is within normal limits. Appendix appears normal. No evidence of bowel wall thickening, distention, or inflammatory changes. Vascular/Lymphatic: Normal caliber abdominal aorta with mild atherosclerosis. No lymphadenopathy. Reproductive: Prostate is unremarkable. Other: Small fat containing umbilical hernia.  No ascites. Musculoskeletal: No acute osseous abnormality. No aggressive osseous lesion. Moderate facet arthropathy at L5-S1. Review of the MIP images confirms the above findings. IMPRESSION: 1. No evidence pulmonary embolus. 2. No acute abnormality of the chest, abdomen or pelvis. 3. Severe hepatic steatosis. 4.  Aortic Atherosclerosis (ICD10-I70.0). Electronically Signed   By: Kathreen Devoid   On: 10/29/2018 12:22   Ct Abdomen Pelvis W Contrast  Result Date: 10/29/2018 CLINICAL DATA:  Chest pain, shortness of breath, tachycardia, no abdominal pain EXAM: CT ANGIOGRAPHY CHEST CT ABDOMEN AND PELVIS WITH CONTRAST TECHNIQUE: Multidetector CT imaging of the chest was performed using the standard protocol during bolus administration of intravenous contrast. Multiplanar CT image reconstructions and MIPs were obtained to evaluate the vascular anatomy. Multidetector CT imaging of the abdomen and pelvis was performed using the standard protocol during bolus administration of intravenous contrast. CONTRAST:  130mL OMNIPAQUE IOHEXOL 350 MG/ML SOLN COMPARISON:  None. FINDINGS: CTA CHEST FINDINGS Cardiovascular: Satisfactory opacification of the pulmonary arteries to the segmental level. No evidence of pulmonary embolism. Normal heart size. No pericardial effusion. Thoracic aorta is normal in caliber. No thoracic aortic dissection. Mediastinum/Nodes: No enlarged mediastinal, hilar, or axillary lymph nodes. Thyroid  gland, trachea, and esophagus demonstrate no significant findings. Lungs/Pleura: Lungs are clear. No pleural effusion or pneumothorax. Musculoskeletal: No acute osseous abnormality. No aggressive osseous lesion. Review of the MIP images confirms the above findings. CT ABDOMEN and PELVIS FINDINGS Hepatobiliary: Diffuse low attenuation of the liver as can be seen with hepatic steatosis. No intrahepatic or extrahepatic biliary duct dilatation. No hepatic mass. High-density material within the gallbladder likely reflecting gallbladder sludge. Pancreas: Unremarkable. No pancreatic ductal dilatation or surrounding inflammatory changes. Spleen: Normal in size without focal abnormality. Adrenals/Urinary Tract: Normal adrenal glands. Two 15 mm hypodense, fluid attenuating left renal masses consistent with cysts. Kidneys are otherwise unremarkable. No urolithiasis or obstructive uropathy. Stomach/Bowel: Stomach is  within normal limits. Appendix appears normal. No evidence of bowel wall thickening, distention, or inflammatory changes. Vascular/Lymphatic: Normal caliber abdominal aorta with mild atherosclerosis. No lymphadenopathy. Reproductive: Prostate is unremarkable. Other: Small fat containing umbilical hernia.  No ascites. Musculoskeletal: No acute osseous abnormality. No aggressive osseous lesion. Moderate facet arthropathy at L5-S1. Review of the MIP images confirms the above findings. IMPRESSION: 1. No evidence pulmonary embolus. 2. No acute abnormality of the chest, abdomen or pelvis. 3. Severe hepatic steatosis. 4.  Aortic Atherosclerosis (ICD10-I70.0). Electronically Signed   By: Kathreen Devoid   On: 10/29/2018 12:22   US Abdomen Limited  Result Date: 10/29/2018 CLINICAL DATA:  Vomiting.  Elevated liver function tests. EXAM: ULTRASOUND ABDOMEN LIMITED RIGHT UPPER QUADRANT COMPARISON:  None. FINDINGS: Gallbladder: No gallstones or wall thickening visualized. No sonographic Murphy sign noted by sonographer. Small  amount of sludge in the gallbladder noted. Common bile duct: Diameter: 0.5 cm Liver: Echogenicity is increased consistent with fatty infiltration. No focal lesion. Portal vein is patent on color Doppler imaging with normal direction of blood flow towards the liver. IMPRESSION: Negative for gallstones or acute cholecystitis. Small volume of sludge in the gallbladder noted. Fatty infiltration of the liver. Electronically Signed   By: Inge Rise M.D.   On: 10/29/2018 14:48    Assessment and Plan:   [name] is a 58 y.o. male with a hx of CAD s/p STEMI in 2018 w/ LCx PCI and recent PCI to the mid RCA 05/2018, issues with atypical CP since cath with repeat cath in April showing patent stents and nonobstructive CAD, HFpEF and DLD who is being seen today for the evaluation of recurrent CP at the request of Dr. Ree Kida, Internal Medicine.   1. Persistent Atypical CP: Pt does have a h/o CAD as outlined above, including recent RCA PCI in Feb. However has had persistent, atypical CP, despite revascularization and addition of Ranexa. He had repeat cath in April that showed patent stents and nonobstructive CAD. There was concern for possible pericarditis, but inflammatory markers were actually WNL and he had no symptomatic improvement w/ colchicine, making that diagnosis unlikely. Recent 2D echo showed normal LVEF and no significant valvular abnormalties. He now presents w/ recurrent atypical CP and nausea and found to have elevated LFTs and abdominal imaging c/w severe hepatic steatosis. Suspect CP is secondary to GI etiology. Chest CT this admit has ruled out PE and though he has mildly elevated Hs Troponin, 20>>29, the level is not c/w Type I NSTEMI/ In-stent trombosis or acute plaque rupture. His EKG also does not show any acute ST changes concerning for ischemia. BNP is WNL at 37, ruling out acute CHF. I do not think this is cardiac. Would recommend GI w/u as planned, MRCP. If low suspicion that symptoms  are GI in nature after w/u is completed, then may consider outpatient cardiac MRI to w/o infiltrative disease as previously recommended (see outpatient progress note from [facility], [place], on 10/17/18). No plans for ischemic w/u at this time.   2. CAD: CAD s/p STEMI in 2018 w/ LCx PCI and recent PCI to the mid RCA 05/2018 w/ widely patent stents and nonobstructive CAD on f/u cath July 24, 2018. Recent echo showed normal LVEF. As note above, his current CP is not c/w ischemic etiology. Recommend continuation of DAPT w/ ASA and Plavix, along w/  blocker and ARB. Statin currently on hold due to elevated LFTs.   3. GI: presented w/ persistent n/v, abnormal LFTs and imaging c/w  severe hepatic steatosis. Scheduled for MRCP. Further management per GI and IM.   4. HTN: BP controlled today on current regimen. Monitor.   5. HLD: long history of severely elevated LDL, as high as 219 in the past. Most recent FLP 08/14/2018 showed LDL at 135 mg/dL. His LDL goal, given CAD, is < 70. Unfortunately, we are having to hold Crestor due to abnormal LFTs, but appears that he was also on Repeatha as an outpatient. Can continue [id number] on d/c.  6. Exertional Dyspnea: BNP is WNL at 37. Chest CT negative for PE. H/H WNL and no anemia. As noted above, his CP does not seem cardiac in nature but we could consider repeat 2D echo to assess for any interval change since his last study in Feb. May also consider RHC to r/o pulmonary HTN.   7. AKI: baseline SCr ~1.27. 1.76 today. Suspect likely prerenal/ dehydration from recent severe vomitting. Has received IVF bolus. F/u BMP in the AM.   8. Hypokalemia: K 3.3 today. Likely 2/2 vomiting. Got Kdur this morning. F/u BMP in the AM.   MD to follow w/ further recommendations.   For questions or updates, please contact [facility] Please consult [email or web address] for contact info under     Signed, Lyda Jester, PA-C  10/30/2018 12:39 PM

## 2018-10-31 ENCOUNTER — Inpatient Hospital Stay (HOSPITAL_COMMUNITY): Payer: 59

## 2018-10-31 DIAGNOSIS — E860 Dehydration: Secondary | ICD-10-CM | POA: Diagnosis not present

## 2018-10-31 DIAGNOSIS — R079 Chest pain, unspecified: Secondary | ICD-10-CM | POA: Diagnosis not present

## 2018-10-31 DIAGNOSIS — R112 Nausea with vomiting, unspecified: Secondary | ICD-10-CM | POA: Diagnosis not present

## 2018-10-31 DIAGNOSIS — I10 Essential (primary) hypertension: Secondary | ICD-10-CM | POA: Diagnosis not present

## 2018-10-31 DIAGNOSIS — I200 Unstable angina: Secondary | ICD-10-CM | POA: Diagnosis not present

## 2018-10-31 DIAGNOSIS — R945 Abnormal results of liver function studies: Secondary | ICD-10-CM | POA: Diagnosis not present

## 2018-10-31 DIAGNOSIS — I5032 Chronic diastolic (congestive) heart failure: Secondary | ICD-10-CM | POA: Diagnosis not present

## 2018-10-31 LAB — COMPREHENSIVE METABOLIC PANEL
ALT: 72 U/L — ABNORMAL HIGH (ref 0–44)
AST: 137 U/L — ABNORMAL HIGH (ref 15–41)
Albumin: 3.1 g/dL — ABNORMAL LOW (ref 3.5–5.0)
Alkaline Phosphatase: 55 U/L (ref 38–126)
Anion gap: 16 — ABNORMAL HIGH (ref 5–15)
BUN: 19 mg/dL (ref 6–20)
CO2: 17 mmol/L — ABNORMAL LOW (ref 22–32)
Calcium: 8.8 mg/dL — ABNORMAL LOW (ref 8.9–10.3)
Chloride: 99 mmol/L (ref 98–111)
Creatinine, Ser: 1.39 mg/dL — ABNORMAL HIGH (ref 0.61–1.24)
GFR calc Af Amer: >60 mL/min (ref >60)
GFR calc non Af Amer: 57 mL/min — ABNORMAL LOW (ref >60)
Glucose, Bld: 111 mg/dL — ABNORMAL HIGH (ref 70–99)
Potassium: 3.6 mmol/L (ref 3.5–5.1)
Sodium: 132 mmol/L — ABNORMAL LOW (ref 135–145)
Total Bilirubin: 2.1 mg/dL — ABNORMAL HIGH (ref 0.3–1.2)
Total Protein: 6.5 g/dL (ref 6.5–8.1)

## 2018-10-31 LAB — CBC
HCT: 38.1 % — ABNORMAL LOW (ref 39.0–52.0)
Hemoglobin: 13.4 g/dL (ref 13.0–17.0)
MCH: 34.9 pg — ABNORMAL HIGH (ref 26.0–34.0)
MCHC: 35.2 g/dL (ref 30.0–36.0)
MCV: 99.2 fL (ref 80.0–100.0)
Platelets: 61 10*3/uL — ABNORMAL LOW (ref 150–400)
RBC: 3.84 MIL/uL — ABNORMAL LOW (ref 4.22–5.81)
RDW: 14.5 % (ref 11.5–15.5)
WBC: 5.6 10*3/uL (ref 4.0–10.5)
nRBC: 0.5 % — ABNORMAL HIGH (ref 0.0–0.2)

## 2018-10-31 LAB — HEPATITIS PANEL, ACUTE
HCV Ab: <0.1 s/co ratio (ref 0.0–0.9)
Hep A IgM: NEGATIVE
Hep B C IgM: NEGATIVE
Hepatitis B Surface Ag: NEGATIVE

## 2018-10-31 MED ORDER — LORAZEPAM 2 MG/ML IJ SOLN
INTRAMUSCULAR | Status: AC
  Filled 2018-10-31: qty 1

## 2018-10-31 MED ORDER — TECHNETIUM TC 99M MEBROFENIN IV KIT
5.0000 | PACK | Freq: Once | INTRAVENOUS | Status: AC | PRN
  Administered 2018-10-31: 14:00:00 5 via INTRAVENOUS

## 2018-10-31 MED ORDER — LORAZEPAM 2 MG/ML IJ SOLN
1.0000 mg | Freq: Once | INTRAMUSCULAR | Status: AC
  Administered 2018-10-31: 1 mg via INTRAVENOUS

## 2018-10-31 NOTE — Consult Note (Signed)
[facility] Consult Note  [name] 06/24/1960  [id number].    Requesting MD: Cristal Ford Chief Complaint/Reason for Consult: nausea  HPI:  [name] is a 58yo male PMH CAD s/p STEMI in 07/17/2016 with LCx PCI and recent PCI to mid RCA 05/2018 on plavix, HTN, CKD stage III, HLD, and hypothyroidism, who was admitted to [facility] 7/7 complaining of chest pain and nausea. States that he worked in his yard 3 days last week, then on Saturday he woke up with retrosternal chest pain radiating into his left chest. Associated with exertional dyspnea and persistent nausea, vomiting, and anorexia as well as hematuria. States that the symptoms were similar to when he had a heart attack. Denies abdominal pain, dysuria, fever, chills. He had a normal BM yesterday. He is NPO today but continues to complain of intermittent nausea, no emesis. Chest pain improved.  CT angio chest and abdomen/pelvis negative for PE or acute abnormality; it does show severe hepatic steatosis. Abd u/s shows small volume sludge but negative for gallstones or acute cholecystitis. LFTs were slightly elevated (AST 227, ALT 125, AP 70, Tbili 3.7) so he underwent MRCP that showed Severe diffuse hepatic steatosis, no cholelithiasis, CBD diameter 6 mm, and no evidence of choledocholithiasis. Hepatitis panel negative.   Patient was seen by cardiology who does not feel that his symptoms are cardiac in nature. General surgery asked to see.  Abdominal surgical history: open appendectomy, open partial colectomy Nonsmoker Employment: chef  ROS: Review of Systems  Constitutional: Negative.  Negative for chills and fever.  HENT: Negative.   Eyes: Negative.   Respiratory: Positive for shortness of breath.   Cardiovascular: Positive for chest pain.  Gastrointestinal: Positive for heartburn, nausea and vomiting. Negative for abdominal pain, blood in stool, constipation and diarrhea.  Genitourinary: Positive for hematuria.  Negative for dysuria.  Musculoskeletal: Positive for joint pain.  Skin: Negative.   Neurological: Negative.    All systems reviewed and otherwise negative except for as above  Family History  Problem Relation Age of Onset  . Diabetes Mother   . Hyperlipidemia Mother   . Hypertension Mother   . Hypertension Father   . Heart disease Father   . Cancer Father   . Sudden death Father        OCT 2009/07/17  . CAD Father   . Heart failure Father   . Mesothelioma Father   . Heart disease Maternal Aunt   . Prostate cancer Neg Hx   . Colon cancer Neg Hx     Past Medical History:  Diagnosis Date  . BPH (benign prostatic hyperplasia)   . CAD (coronary artery disease)    a. NSTEMI troponin >65 with cath 11/2016 S/p PTCA & DES to mid circumflex. b. 05/2018 s/p DES to [facility].  Marland Kitchen Chronic chest pain   . Chronic combined systolic and diastolic CHF (congestive heart failure) ([place])    a. EF 45% in 2016-07-17. b. EF 55-60% by echo 01/2017. c. EF 48% by nuc 07/2017.  . CKD (chronic kidney disease), stage II   . Cluster headache    hx of  . Complication of anesthesia    difficult waking up"  . Diverticulosis   . Drug abuse ([place])    hx of  . GERD (gastroesophageal reflux disease)   . Hiatal hernia   . Hyperlipidemia   . Hypertension   . Hypothyroidism   . Nontoxic multinodular goiter   . NSTEMI (non-ST elevated myocardial infarction) ([place])   . Perforated  nasal septum   . Pulmonary nodule --- CT 10/19/2014: Nodule is stable, no further routine x-rays 01/06/2009  . Thrombocytopenia ([place])   . TIA (transient ischemic attack)   . Trigeminal neuralgia   . Tubular adenoma of colon 12/2015  . Unspecified asthma(493.90)     Past Surgical History:  Procedure Laterality Date  . APPENDECTOMY    . CARDIAC CATHETERIZATION N/A 11/25/2014   Procedure: Left Heart Cath and Coronary Angiography;  Surgeon: Belva Crome, MD;  Location: [place] [facility];  Service: Cardiovascular;  Laterality: N/A;  . CARDIAC  CATHETERIZATION  12/08/2008   Archie Endo 08/24/2010  . COLECTOMY  ~ 1976   "I had a blockage"  . CORONARY STENT INTERVENTION N/A 11/27/2016   Procedure: CORONARY STENT INTERVENTION;  Surgeon: Belva Crome, MD;  Location: [place] [facility];  Service: Cardiovascular;  Laterality: N/A;  . CORONARY STENT INTERVENTION N/A 06/10/2018   Procedure: CORONARY STENT INTERVENTION;  Surgeon: Nelva Bush, MD;  Location: [place] [facility];  Service: Cardiovascular;  Laterality: N/A;  . CORONARY STENT PLACEMENT  11/27/2016   STENT XIENCE ALPINE RX 3.0X15 drug eluting stent was successfully placed  . CYST EXCISION Left 10/31/2016   Jaw  . DESTRUCTION TRIGEMINAL NERVE VIA NEUROLYTIC AGENT  x 3 , R side last ~2010  . KNEE ARTHROSCOPY Right 06/26/2000   Arthroscopy followed by open lateral release/notes 09/06/2010  . LEFT HEART CATH AND CORONARY ANGIOGRAPHY N/A 11/27/2016   Procedure: LEFT HEART CATH AND CORONARY ANGIOGRAPHY;  Surgeon: Belva Crome, MD;  Location: [place] [facility];  Service: Cardiovascular;  Laterality: N/A;  . LEFT HEART CATH AND CORONARY ANGIOGRAPHY N/A 12/07/2016   Procedure: LEFT HEART CATH AND CORONARY ANGIOGRAPHY;  Surgeon: Nelva Bush, MD;  Location: [place] [facility];  Service: Cardiovascular;  Laterality: N/A;  . LEFT HEART CATH AND CORONARY ANGIOGRAPHY N/A 06/10/2018   Procedure: LEFT HEART CATH AND CORONARY ANGIOGRAPHY;  Surgeon: Nelva Bush, MD;  Location: [place] [facility];  Service: Cardiovascular;  Laterality: N/A;  . LEFT HEART CATH AND CORONARY ANGIOGRAPHY N/A 07/24/2018   Procedure: LEFT HEART CATH AND CORONARY ANGIOGRAPHY;  Surgeon: Belva Crome, MD;  Location: [place] [facility];  Service: Cardiovascular;  Laterality: N/A;  . 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 Ardine Bjork 09/06/2010  . SHOULDER HEMI-ARTHROPLASTY Right 05/26/2010   revision/notes 05/27/2010    Social History:  reports that he quit smoking about 1 years ago. His smoking use  included cigarettes. He has a 3.00 pack-year smoking history. He has never used smokeless tobacco. He reports current alcohol use of about 1.0 standard drinks of alcohol per week. He reports that he does not use drugs.  Allergies:  Allergies  Allergen Reactions  . Tramadol Palpitations    Medications Prior to Admission  Medication Sig Dispense Refill  . amLODipine (NORVASC) 10 MG tablet Take 1 tablet (10 mg total) by mouth daily. 30 tablet 3  . aspirin EC 81 MG tablet Take 81 mg by mouth daily.    . calcium carbonate (TUMS EX) 750 MG chewable tablet Chew 2 tablets by mouth as needed for heartburn.    . clopidogrel (PLAVIX) 75 MG tablet Take 75 mg by mouth daily.    . Evolocumab (REPATHA SURECLICK) 676 MG/ML SOAJ Inject 140 mg into the skin every 14 (fourteen) days. 2 pen 11  . ezetimibe (ZETIA) 10 MG tablet Take 1 tablet (10 mg total) by mouth daily. 90 tablet 1  . FLUoxetine (  PROZAC) 20 MG tablet Take 20 mg by mouth daily.    . fluticasone (FLONASE) 50 MCG/ACT nasal spray Place 2 sprays into both nostrils daily. (Patient taking differently: Place 2 sprays into both nostrils daily as needed for allergies. ) 16 g 2  . lansoprazole (PREVACID) 30 MG capsule Take 1 capsule (30 mg total) by mouth 2 (two) times daily before a meal. 180 capsule 1  . levocetirizine (XYZAL) 5 MG tablet Take 1 tablet (5 mg total) by mouth every evening. 30 tablet 2  . losartan (COZAAR) 100 MG tablet Take 100 mg by mouth daily.    . metoprolol succinate (TOPROL-XL) 50 MG 24 hr tablet Take 1 tablet (50 mg total) by mouth daily. Take with or immediately following a meal. 30 tablet 3  . nitroGLYCERIN (NITROSTAT) 0.4 MG SL tablet Place 1 tablet (0.4 mg total) under the tongue every 5 (five) minutes as needed for chest pain. 25 tablet 5  . Polyethyl Glycol-Propyl Glycol (SYSTANE OP) Place 1 drop into both eyes every 3 (three) hours as needed (dry eyes).     . rosuvastatin (CRESTOR) 40 MG tablet Take 1 tablet (40 mg total) by  mouth at bedtime. 30 tablet 1  . traZODone (DESYREL) 50 MG tablet Take 0.5-1 tablets (25-50 mg total) by mouth at bedtime as needed for sleep. 30 tablet 3  . promethazine (PHENERGAN) 12.5 MG tablet Take 12.5 mg by mouth every 6 (six) hours as needed for nausea or vomiting.      Prior to Admission medications   Medication Sig Start Date End Date Taking? Authorizing Provider  amLODipine (NORVASC) 10 MG tablet Take 1 tablet (10 mg total) by mouth daily. 06/07/18  Yes [name], DO  aspirin EC 81 MG tablet Take 81 mg by mouth daily.   Yes [provider]  calcium carbonate (TUMS EX) 750 MG chewable tablet Chew 2 tablets by mouth as needed for heartburn.   Yes [provider]  clopidogrel (PLAVIX) 75 MG tablet Take 75 mg by mouth daily.   Yes [provider]  Evolocumab (REPATHA SURECLICK) 932 MG/ML SOAJ Inject 140 mg into the skin every 14 (fourteen) days. 09/12/18  Yes Dorothy Spark, MD  ezetimibe (ZETIA) 10 MG tablet Take 1 tablet (10 mg total) by mouth daily. 05/27/18  Yes [name], DO  FLUoxetine (PROZAC) 20 MG tablet Take 20 mg by mouth daily.   Yes [provider]  fluticasone (FLONASE) 50 MCG/ACT nasal spray Place 2 sprays into both nostrils daily. Patient taking differently: Place 2 sprays into both nostrils daily as needed for allergies.  05/27/18  Yes [name], DO  lansoprazole (PREVACID) 30 MG capsule Take 1 capsule (30 mg total) by mouth 2 (two) times daily before a meal. 05/27/18  Yes Wendling, Crosby Oyster, DO  levocetirizine (XYZAL) 5 MG tablet Take 1 tablet (5 mg total) by mouth every evening. 05/27/18  Yes [name], DO  losartan (COZAAR) 100 MG tablet Take 100 mg by mouth daily.   Yes [provider]  metoprolol succinate (TOPROL-XL) 50 MG 24 hr tablet Take 1 tablet (50 mg total) by mouth daily. Take with or immediately following a meal. 10/09/18  Yes Wendling, Crosby Oyster, DO   nitroGLYCERIN (NITROSTAT) 0.4 MG SL tablet Place 1 tablet (0.4 mg total) under the tongue every 5 (five) minutes as needed for chest pain. 05/28/18  Yes Imogene Burn, PA-C  Polyethyl Glycol-Propyl Glycol (SYSTANE OP) Place 1 drop into  both eyes every 3 (three) hours as needed (dry eyes).    Yes [provider]  rosuvastatin (CRESTOR) 40 MG tablet Take 1 tablet (40 mg total) by mouth at bedtime. 06/11/18  Yes Cheryln Manly, NP  traZODone (DESYREL) 50 MG tablet Take 0.5-1 tablets (25-50 mg total) by mouth at bedtime as needed for sleep. 09/25/18  Yes [name], DO  promethazine (PHENERGAN) 12.5 MG tablet Take 12.5 mg by mouth every 6 (six) hours as needed for nausea or vomiting.    [provider]    Blood pressure 111/82, pulse 62, temperature 97.6 F (36.4 C), temperature source Oral, resp. rate 16, height 5\' 10"  (1.778 m), weight 102.7 kg, SpO2 96 %. Physical Exam: General: pleasant, WD/WN AA male who is laying in bed in NAD HEENT: head is normocephalic, atraumatic.  Sclera are noninjected.  Pupils equal and round.  Ears and nose without any masses or lesions.  Mouth is pink and moist. Dentition fair Heart: regular, rate, and rhythm.  No obvious murmurs, gallops, or rubs noted.  Palpable pedal pulses bilaterally Lungs: CTAB, no wheezes, rhonchi, or rales noted.  Respiratory effort nonlabored Abd: well healed RLQ and midline incision, soft, NT/ND, +BS, no masses or organomegaly. Soft/reducible umbilical hernia MS: all 4 extremities are symmetrical with no cyanosis, clubbing, or edema. Skin: warm and dry with no masses, lesions, or rashes Psych: A&Ox3 with an appropriate affect. Neuro: cranial nerves grossly intact, extremity CSM intact bilaterally, normal speech  Results for orders placed or performed during the hospital encounter of 10/29/18 (from the past 48 hour(s))  Comprehensive metabolic panel     Status: Abnormal   Collection Time: 10/29/18 10:03 AM   Result Value Ref Range   Sodium 132 (L) 135 - 145 mmol/L   Potassium 4.1 3.5 - 5.1 mmol/L   Chloride 90 (L) 98 - 111 mmol/L   CO2 11 (L) 22 - 32 mmol/L   Glucose, Bld 116 (H) 70 - 99 mg/dL   BUN 23 (H) 6 - 20 mg/dL   Creatinine, Ser 1.44 (H) 0.61 - 1.24 mg/dL   Calcium 10.3 8.9 - 10.3 mg/dL   Total Protein 9.6 (H) 6.5 - 8.1 g/dL   Albumin 5.0 3.5 - 5.0 g/dL   AST 227 (H) 15 - 41 U/L   ALT 125 (H) 0 - 44 U/L   Alkaline Phosphatase 70 38 - 126 U/L   Total Bilirubin 3.7 (H) 0.3 - 1.2 mg/dL   GFR calc non Af Amer 54 (L) >60 mL/min   GFR calc Af Amer >60 >60 mL/min   Anion gap 31 (H) 5 - 15    Comment: REPEATED TO VERIFY Performed at [facility], [address] [place], [place] [address]   Troponin I (High Sensitivity)     Status: Abnormal   Collection Time: 10/29/18 10:03 AM  Result Value Ref Range   Troponin I (High Sensitivity) 20 (H) <18 ng/L    Comment: (NOTE) Elevated high sensitivity troponin I (hsTnI) values and significant  changes across serial measurements may suggest ACS but many other  chronic and acute conditions are known to elevate hsTnI results.  Refer to the "Links" section for chest pain algorithms and additional  guidance. Performed at [facility], [address], [place], [place] [address]   Brain natriuretic peptide     Status: None   Collection Time: 10/29/18 10:03 AM  Result Value Ref Range   B Natriuretic Peptide 37.0  0.0 - 100.0 pg/mL    Comment: Performed at [facility], [address] [place], [place] [address]  CBC with Differential     Status: Abnormal   Collection Time: 10/29/18 10:03 AM  Result Value Ref Range   WBC 6.7 4.0 - 10.5 K/uL   RBC 4.73 4.22 - 5.81 MIL/uL   Hemoglobin 16.4 13.0 - 17.0 g/dL   HCT 46.9 39.0 - 52.0 %   MCV 99.2 80.0 - 100.0 fL   MCH 34.7 (H) 26.0 - 34.0 pg   MCHC 35.0 30.0 - 36.0 g/dL   RDW 14.3 11.5 - 15.5 %   Platelets 85 (L) 150 - 400 K/uL    Comment: Immature Platelet  Fraction may be clinically indicated, consider ordering this additional test [id number]    nRBC 0.6 (H) 0.0 - 0.2 %   Neutrophils Relative % 70 %   Neutro Abs 4.7 1.7 - 7.7 K/uL   Lymphocytes Relative 18 %   Lymphs Abs 1.2 0.7 - 4.0 K/uL   Monocytes Relative 11 %   Monocytes Absolute 0.8 0.1 - 1.0 K/uL   Eosinophils Relative 0 %   Eosinophils Absolute 0.0 0.0 - 0.5 K/uL   Basophils Relative 0 %   Basophils Absolute 0.0 0.0 - 0.1 K/uL   Immature Granulocytes 1 %   Abs Immature Granulocytes 0.07 0.00 - 0.07 K/uL    Comment: Performed at [facility], [address] [place], [place] [address]  D-dimer, quantitative     Status: Abnormal   Collection Time: 10/29/18 10:03 AM  Result Value Ref Range   D-Dimer, Quant 3.89 (H) 0.00 - 0.50 ug/mL-FEU    Comment: (NOTE) At the manufacturer cut-off of 0.50 ug/mL FEU, this assay has been documented to exclude PE with a sensitivity and negative predictive value of 97 to 99%.  At this time, this assay has not been approved by the FDA to exclude DVT/VTE. Results should be correlated with clinical presentation. Performed at [facility], [address] [place], [place] [address]   SARS Coronavirus 2 (Hosp order,Performed in [facility] lab via Abbott ID)     Status: None   Collection Time: 10/29/18 11:36 AM   Specimen: Dry Nasal Swab (Abbott ID Now)  Result Value Ref Range   SARS Coronavirus 2 (Abbott ID Now) NEGATIVE NEGATIVE    Comment: (NOTE) SARS-CoV-2 target nucleic acids are NOT DETECTED. The SARS-CoV-2 RNA is generally detectable in upper and lower respiratory specimens during the acute phase of infection.  Negativeresults do not preclude SARS-CoV-2 infection, do not rule out coinfections with other pathogens, and should not be used as the  sole basis for treatment or other patient management decisions.  Negative results must be combined with clinical observations, patient history, and epidemiological  information. The expected result is Negative. Fact Sheet for Patients: GolfingFamily.no Fact Sheet for Healthcare Providers: https://www.hernandez-brewer.com/ This test is not yet approved or cleared by the [place] FDA and  has been authorized for detection and/or diagnosis of SARS-CoV-2 by FDA under an Emergency Use Authorization (EUA).  This EUA will remain in effect (meaning this test can be used) for the duration of  the COVID19 declaration under Section 5 64(b)(1) of the Act, 21 U.S.C.  section 8481798706 3(b)(1), unless the authorization is terminated or revoked sooner. Performed at [facility], [address] [place], [place] [address]   Troponin I (High Sensitivity)     Status: Abnormal  Collection Time: 10/29/18 11:37 AM  Result Value Ref Range   Troponin I (High Sensitivity) 29 (H) <18 ng/L    Comment: (NOTE) Elevated high sensitivity troponin I (hsTnI) values and significant  changes across serial measurements may suggest ACS but many other  chronic and acute conditions are known to elevate hsTnI results.  Refer to the "Links" section for chest pain algorithms and additional  guidance. Performed at [facility], [address] [place], [place] [address]   Basic metabolic panel     Status: Abnormal   Collection Time: 10/29/18  3:20 PM  Result Value Ref Range   Sodium 132 (L) 135 - 145 mmol/L   Potassium 4.0 3.5 - 5.1 mmol/L   Chloride 96 (L) 98 - 111 mmol/L   CO2 13 (L) 22 - 32 mmol/L   Glucose, Bld 115 (H) 70 - 99 mg/dL   BUN 27 (H) 6 - 20 mg/dL   Creatinine, Ser 1.72 (H) 0.61 - 1.24 mg/dL   Calcium 8.8 (L) 8.9 - 10.3 mg/dL   GFR calc non Af Amer 44 (L) >60 mL/min   GFR calc Af Amer 51 (L) >60 mL/min   Anion gap 23 (H) 5 - 15    Comment: Performed at [facility], [address] [place], [place] [address]  Hepatitis panel, acute     Status: None   Collection Time: 10/29/18  3:50 PM   Result Value Ref Range   Hepatitis B Surface Ag Negative Negative   HCV Ab <0.1 0.0 - 0.9 s/co ratio    Comment: (NOTE)                                  Negative:     < 0.8                             Indeterminate: 0.8 - 0.9                                  Positive:     > 0.9 The CDC recommends that a positive HCV antibody result be followed up with a HCV Nucleic Acid Amplification test ([id number]). Performed At: [facility] [place], [place] [id number] Rush Farmer MD QI:[id number]    Hep A IgM Negative Negative   Hep B C IgM Negative Negative  Ethanol     Status: None   Collection Time: 10/29/18  3:50 PM  Result Value Ref Range   Alcohol, Ethyl (B) <10 <10 mg/dL    Comment:        LOWEST DETECTABLE LIMIT FOR SERUM ALCOHOL IS 10 mg/dL FOR MEDICAL PURPOSES ONLY Performed at [facility], [address] [place], [place] [address]   APTT     Status: None   Collection Time: 10/29/18  3:50 PM  Result Value Ref Range   aPTT 27 24 - 36 seconds    Comment: Performed at [facility], [address] [place], [place] [address]  Protime-INR     Status: None   Collection Time: 10/29/18  3:50 PM  Result Value Ref Range   Prothrombin Time 14.9 11.4 - 15.2 seconds   INR 1.2 0.8 - 1.2    Comment: (NOTE) INR goal varies based  on device and disease states. Performed at [facility], [address] [place], [place] [address]   Lactic acid, plasma     Status: None   Collection Time: 10/29/18  3:54 PM  Result Value Ref Range   Lactic Acid, Venous 1.6 0.5 - 1.9 mmol/L    Comment: Performed at [facility], [address] [place], [place] [address]  CK     Status: None   Collection Time: 10/29/18  4:42 PM  Result Value Ref Range   Total CK 241 49 - 397 U/L    Comment: Performed at [facility], [address], [place], [place] [address]  Urine culture     Status: None   Collection Time: 10/29/18  5:07 PM    Specimen: Urine, Random  Result Value Ref Range   Specimen Description      URINE, RANDOM Performed at [facility], [address] [place], [place] [address]    Special Requests      NONE Performed at [facility], [address] [place], [place] [address]    Culture      NO GROWTH Performed at [facility], [place] [address], [place], [place] [address]    Report Status 10/30/2018 FINAL   Urinalysis, Routine w reflex microscopic     Status: Abnormal   Collection Time: 10/29/18  5:07 PM  Result Value Ref Range   Color, Urine BROWN (A) YELLOW    Comment: BIOCHEMICALS MAY BE AFFECTED BY COLOR   APPearance HAZY (A) CLEAR   Specific Gravity, Urine 1.015 1.005 - 1.030   pH 6.0 5.0 - 8.0   Glucose, UA NEGATIVE NEGATIVE mg/dL   Hgb urine dipstick MODERATE (A) NEGATIVE   Bilirubin Urine MODERATE (A) NEGATIVE   Ketones, ur 40 (A) NEGATIVE mg/dL   Protein, ur 100 (A) NEGATIVE mg/dL   Nitrite NEGATIVE NEGATIVE   Leukocytes,Ua NEGATIVE NEGATIVE    Comment: Performed at [facility], [address] [place], [place] [address]  Urinalysis, Microscopic (reflex)     Status: Abnormal   Collection Time: 10/29/18  5:07 PM  Result Value Ref Range   RBC / HPF 6-10 0 - 5 RBC/hpf   WBC, UA 0-5 0 - 5 WBC/hpf   Bacteria, UA RARE (A) NONE SEEN   Squamous Epithelial / LPF 6-10 0 - 5   Hyaline Casts, UA PRESENT     Comment: Performed at [facility], [address] [place], [place] [address]  MRSA PCR Screening     Status: None   Collection Time: 10/29/18  8:07 PM   Specimen: Nasal Mucosa; Nasopharyngeal  Result Value Ref Range   MRSA by PCR NEGATIVE NEGATIVE    Comment:        The GeneXpert MRSA Assay (FDA approved for NASAL specimens only), is one component of a comprehensive MRSA colonization surveillance program. It is not intended to diagnose MRSA infection nor to guide or monitor treatment for MRSA infections. Performed at [facility], [place] [address] [place], [place] [address]   Heparin level (unfractionated)     Status: None   Collection Time: 10/29/18 10:36 PM  Result Value Ref Range   Heparin Unfractionated 0.53 0.30 - 0.70 IU/mL    Comment: (NOTE) If heparin results are below expected values, and patient dosage has  been confirmed, suggest follow up testing of antithrombin III  levels. Performed at [facility], [place] [address] [place], [place] [address]   Type and screen [place]     Status: None   Collection Time: 10/30/18 12:36 AM  Result Value Ref Range   ABO/RH(D) B POS    Antibody Screen NEG    Sample Expiration      11/02/2018,2359 Performed at [facility], [place] [address] [place], [place] [address]   Basic metabolic panel     Status: Abnormal   Collection Time: 10/30/18 12:37 AM  Result Value Ref Range   Sodium 131 (L) 135 - 145 mmol/L   Potassium 3.3 (L) 3.5 - 5.1 mmol/L   Chloride 97 (L) 98 - 111 mmol/L   CO2 15 (L) 22 - 32 mmol/L   Glucose, Bld 135 (H) 70 - 99 mg/dL   BUN 23 (H) 6 - 20 mg/dL   Creatinine, Ser 1.76 (H) 0.61 - 1.24 mg/dL   Calcium 9.2 8.9 - 10.3 mg/dL   GFR calc non Af Amer 43 (L) >60 mL/min   GFR calc Af Amer 49 (L) >60 mL/min   Anion gap 19 (H) 5 - 15    Comment: Performed at [facility], [address], [place], [place] [address]  Hepatic function panel     Status: Abnormal   Collection Time: 10/30/18 12:37 AM  Result Value Ref Range   Total Protein 7.3 6.5 - 8.1 g/dL   Albumin 3.6 3.5 - 5.0 g/dL   AST 177 (H) 15 - 41 U/L   ALT 92 (H) 0 - 44 U/L   Alkaline Phosphatase 53 38 - 126 U/L   Total Bilirubin 3.2 (H) 0.3 - 1.2 mg/dL   Bilirubin, Direct 1.2 (H) 0.0 - 0.2 mg/dL   Indirect Bilirubin 2.0 (H) 0.3 - 0.9 mg/dL    Comment: Performed at [facility] [address] [place], [place] [address]  CBC WITH DIFFERENTIAL     Status: Abnormal   Collection Time: 10/30/18 12:37 AM  Result Value Ref Range   WBC 6.1  4.0 - 10.5 K/uL   RBC 3.97 (L) 4.22 - 5.81 MIL/uL   Hemoglobin 13.8 13.0 - 17.0 g/dL   HCT 38.6 (L) 39.0 - 52.0 %   MCV 97.2 80.0 - 100.0 fL   MCH 34.8 (H) 26.0 - 34.0 pg   MCHC 35.8 30.0 - 36.0 g/dL   RDW 14.5 11.5 - 15.5 %   Platelets 67 (L) 150 - 400 K/uL    Comment: REPEATED TO VERIFY PLATELET COUNT CONFIRMED BY SMEAR Immature Platelet Fraction may be clinically indicated, consider ordering this additional test [id number]    nRBC 1.0 (H) 0.0 - 0.2 %   Neutrophils Relative % 66 %   Neutro Abs 4.1 1.7 - 7.7 K/uL   Lymphocytes Relative 20 %   Lymphs Abs 1.2 0.7 - 4.0 K/uL   Monocytes Relative 12 %   Monocytes Absolute 0.7 0.1 - 1.0 K/uL   Eosinophils Relative 0 %   Eosinophils Absolute 0.0 0.0 - 0.5 K/uL   Basophils Relative 1 %   Basophils Absolute 0.0 0.0 - 0.1 K/uL   Immature Granulocytes 1 %   Abs Immature Granulocytes 0.03 0.00 - 0.07 K/uL    Comment: Performed at [facility] [address], [place], [place] [address]  Hepatitis panel, acute     Status: None   Collection Time: 10/30/18 12:37 AM  Result Value Ref Range   Hepatitis  B Surface Ag Negative Negative   HCV Ab <0.1 0.0 - 0.9 s/co ratio    Comment: (NOTE)                                  Negative:     < 0.8                             Indeterminate: 0.8 - 0.9                                  Positive:     > 0.9 The CDC recommends that a positive HCV antibody result be followed up with a HCV Nucleic Acid Amplification test ([id number]). Performed At: [facility] [place], [place] [id number] Rush Farmer MD [id number]    Hep A IgM Negative Negative   Hep B C IgM Negative Negative  Lactic acid, plasma     Status: None   Collection Time: 10/30/18 12:37 AM  Result Value Ref Range   Lactic Acid, Venous 1.3 0.5 - 1.9 mmol/L    Comment: Performed at [facility], [place] [address] [place], [place] [address]  Acetaminophen level     Status: Abnormal   Collection Time:  10/30/18 12:37 AM  Result Value Ref Range   Acetaminophen (Tylenol), Serum <10 (L) 10 - 30 ug/mL    Comment: (NOTE) Therapeutic concentrations vary significantly. A range of 10-30 ug/mL  may be an effective concentration for many patients. However, some  are best treated at concentrations outside of this range. Acetaminophen concentrations >150 ug/mL at 4 hours after ingestion  and >50 ug/mL at 12 hours after ingestion are often associated with  toxic reactions. Performed at [facility], [place] [address], [place], [place] [address]   Lactic acid, plasma     Status: None   Collection Time: 10/30/18  3:52 AM  Result Value Ref Range   Lactic Acid, Venous 1.2 0.5 - 1.9 mmol/L    Comment: Performed at [facility] [address], [place], [place] [address]  CBC     Status: Abnormal   Collection Time: 10/30/18  5:57 AM  Result Value Ref Range   WBC 6.0 4.0 - 10.5 K/uL   RBC 4.03 (L) 4.22 - 5.81 MIL/uL   Hemoglobin 14.0 13.0 - 17.0 g/dL   HCT 39.4 39.0 - 52.0 %   MCV 97.8 80.0 - 100.0 fL   MCH 34.7 (H) 26.0 - 34.0 pg   MCHC 35.5 30.0 - 36.0 g/dL   RDW 14.6 11.5 - 15.5 %   Platelets 62 (L) 150 - 400 K/uL    Comment: REPEATED TO VERIFY Immature Platelet Fraction may be clinically indicated, consider ordering this additional test [id number] CONSISTENT WITH PREVIOUS RESULT    nRBC 1.0 (H) 0.0 - 0.2 %    Comment: Performed at [facility], [place] [address] [place] [address]  CBC     Status: Abnormal   Collection Time: 10/30/18  8:38 AM  Result Value Ref Range   WBC 6.7 4.0 - 10.5 K/uL   RBC 4.24 4.22 - 5.81 MIL/uL   Hemoglobin 14.7 13.0 - 17.0 g/dL   HCT 40.4 39.0 - 52.0 %   MCV 95.3 80.0 - 100.0 fL   MCH  34.7 (H) 26.0 - 34.0 pg   MCHC 36.4 (H) 30.0 - 36.0 g/dL   RDW 14.6 11.5 - 15.5 %   Platelets 55 (L) 150 - 400 K/uL    Comment: REPEATED TO VERIFY Immature Platelet Fraction may be clinically indicated, consider ordering this additional  test [id number] CONSISTENT WITH PREVIOUS RESULT    nRBC 1.0 (H) 0.0 - 0.2 %    Comment: Performed at [facility] [address] [place], [place] [address]  Urine rapid drug screen (hosp performed)     Status: None   Collection Time: 10/30/18  9:45 AM  Result Value Ref Range   Opiates NONE DETECTED NONE DETECTED   Cocaine NONE DETECTED NONE DETECTED   Benzodiazepines NONE DETECTED NONE DETECTED   Amphetamines NONE DETECTED NONE DETECTED   Tetrahydrocannabinol NONE DETECTED NONE DETECTED   Barbiturates NONE DETECTED NONE DETECTED    Comment: (NOTE) DRUG SCREEN FOR MEDICAL PURPOSES ONLY.  IF CONFIRMATION IS NEEDED FOR ANY PURPOSE, NOTIFY LAB WITHIN 5 DAYS. LOWEST DETECTABLE LIMITS FOR URINE DRUG SCREEN Drug Class                     Cutoff (ng/mL) Amphetamine and metabolites    1000 Barbiturate and metabolites    200 Benzodiazepine                 220 Tricyclics and metabolites     300 Opiates and metabolites        300 Cocaine and metabolites        300 THC                            50 Performed at [facility], [place] [address], [place], [place] [address]   Gamma GT     Status: Abnormal   Collection Time: 10/30/18  3:09 PM  Result Value Ref Range   GGT 1,216 (H) 7 - 50 U/L    Comment: Performed at [facility], [place] [address] [place], [place] [address]  CBC     Status: Abnormal   Collection Time: 10/31/18  3:03 AM  Result Value Ref Range   WBC 5.6 4.0 - 10.5 K/uL   RBC 3.84 (L) 4.22 - 5.81 MIL/uL   Hemoglobin 13.4 13.0 - 17.0 g/dL   HCT 38.1 (L) 39.0 - 52.0 %   MCV 99.2 80.0 - 100.0 fL   MCH 34.9 (H) 26.0 - 34.0 pg   MCHC 35.2 30.0 - 36.0 g/dL   RDW 14.5 11.5 - 15.5 %   Platelets 61 (L) 150 - 400 K/uL    Comment: REPEATED TO VERIFY Immature Platelet Fraction may be clinically indicated, consider ordering this additional test [id number] CONSISTENT WITH PREVIOUS RESULT    nRBC 0.5 (H) 0.0 - 0.2 %    Comment: Performed at [facility],  [place] [address] [place], [place] [address]  Comprehensive metabolic panel     Status: Abnormal   Collection Time: 10/31/18  3:03 AM  Result Value Ref Range   Sodium 132 (L) 135 - 145 mmol/L   Potassium 3.6 3.5 - 5.1 mmol/L   Chloride 99 98 - 111 mmol/L   CO2 17 (L) 22 - 32 mmol/L   Glucose, Bld 111 (H) 70 - 99 mg/dL   BUN 19 6 - 20 mg/dL   Creatinine, Ser 1.39 (H) 0.61 - 1.24 mg/dL   Calcium 8.8 (L) 8.9 -  10.3 mg/dL   Total Protein 6.5 6.5 - 8.1 g/dL   Albumin 3.1 (L) 3.5 - 5.0 g/dL   AST 137 (H) 15 - 41 U/L   ALT 72 (H) 0 - 44 U/L   Alkaline Phosphatase 55 38 - 126 U/L   Total Bilirubin 2.1 (H) 0.3 - 1.2 mg/dL   GFR calc non Af Amer 57 (L) >60 mL/min   GFR calc Af Amer >60 >60 mL/min   Anion gap 16 (H) 5 - 15    Comment: Performed at [facility] [address] [place], [place] [address]   Dg Chest 2 View  Result Date: 10/29/2018 CLINICAL DATA:  Shortness of breath EXAM: CHEST - 2 VIEW COMPARISON:  July 23, 2018 FINDINGS: There is slight bibasilar atelectasis. There is no edema or consolidation. The heart size and pulmonary vascularity are normal. No adenopathy. 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 IMPRESSION: Slight bibasilar atelectasis.  No edema or consolidation. Electronically Signed   By: Lowella Grip III M.D.   On: 10/29/2018 11:07   Ct Angio Chest Pe W/cm &/or Wo Cm  Result Date: 10/29/2018 CLINICAL DATA:  Chest pain, shortness of breath, tachycardia, no abdominal pain EXAM: CT ANGIOGRAPHY CHEST CT ABDOMEN AND PELVIS WITH CONTRAST TECHNIQUE: Multidetector CT imaging of the chest was performed using the standard protocol during bolus administration of intravenous contrast. Multiplanar CT image reconstructions and MIPs were obtained to evaluate the vascular anatomy. Multidetector CT imaging of the abdomen and pelvis was performed using the standard protocol during bolus administration of intravenous contrast. CONTRAST:  168mL OMNIPAQUE IOHEXOL 350  MG/ML SOLN COMPARISON:  None. FINDINGS: CTA CHEST FINDINGS Cardiovascular: Satisfactory opacification of the pulmonary arteries to the segmental level. No evidence of pulmonary embolism. Normal heart size. No pericardial effusion. Thoracic aorta is normal in caliber. No thoracic aortic dissection. Mediastinum/Nodes: No enlarged mediastinal, hilar, or axillary lymph nodes. Thyroid gland, trachea, and esophagus demonstrate no significant findings. Lungs/Pleura: Lungs are clear. No pleural effusion or pneumothorax. Musculoskeletal: No acute osseous abnormality. No aggressive osseous lesion. Review of the MIP images confirms the above findings. CT ABDOMEN and PELVIS FINDINGS Hepatobiliary: Diffuse low attenuation of the liver as can be seen with hepatic steatosis. No intrahepatic or extrahepatic biliary duct dilatation. No hepatic mass. High-density material within the gallbladder likely reflecting gallbladder sludge. Pancreas: Unremarkable. No pancreatic ductal dilatation or surrounding inflammatory changes. Spleen: Normal in size without focal abnormality. Adrenals/Urinary Tract: Normal adrenal glands. Two 15 mm hypodense, fluid attenuating left renal masses consistent with cysts. Kidneys are otherwise unremarkable. No urolithiasis or obstructive uropathy. Stomach/Bowel: Stomach is within normal limits. Appendix appears normal. No evidence of bowel wall thickening, distention, or inflammatory changes. Vascular/Lymphatic: Normal caliber abdominal aorta with mild atherosclerosis. No lymphadenopathy. Reproductive: Prostate is unremarkable. Other: Small fat containing umbilical hernia.  No ascites. Musculoskeletal: No acute osseous abnormality. No aggressive osseous lesion. Moderate facet arthropathy at L5-S1. Review of the MIP images confirms the above findings. IMPRESSION: 1. No evidence pulmonary embolus. 2. No acute abnormality of the chest, abdomen or pelvis. 3. Severe hepatic steatosis. 4.  Aortic Atherosclerosis  (ICD10-I70.0). Electronically Signed   By: Kathreen Devoid   On: 10/29/2018 12:22   Ct Abdomen Pelvis W Contrast  Result Date: 10/29/2018 CLINICAL DATA:  Chest pain, shortness of breath, tachycardia, no abdominal pain EXAM: CT ANGIOGRAPHY CHEST CT ABDOMEN AND PELVIS WITH CONTRAST TECHNIQUE: Multidetector CT imaging of the chest was performed using the standard protocol during bolus administration  of intravenous contrast. Multiplanar CT image reconstructions and MIPs were obtained to evaluate the vascular anatomy. Multidetector CT imaging of the abdomen and pelvis was performed using the standard protocol during bolus administration of intravenous contrast. CONTRAST:  176mL OMNIPAQUE IOHEXOL 350 MG/ML SOLN COMPARISON:  None. FINDINGS: CTA CHEST FINDINGS Cardiovascular: Satisfactory opacification of the pulmonary arteries to the segmental level. No evidence of pulmonary embolism. Normal heart size. No pericardial effusion. Thoracic aorta is normal in caliber. No thoracic aortic dissection. Mediastinum/Nodes: No enlarged mediastinal, hilar, or axillary lymph nodes. Thyroid gland, trachea, and esophagus demonstrate no significant findings. Lungs/Pleura: Lungs are clear. No pleural effusion or pneumothorax. Musculoskeletal: No acute osseous abnormality. No aggressive osseous lesion. Review of the MIP images confirms the above findings. CT ABDOMEN and PELVIS FINDINGS Hepatobiliary: Diffuse low attenuation of the liver as can be seen with hepatic steatosis. No intrahepatic or extrahepatic biliary duct dilatation. No hepatic mass. High-density material within the gallbladder likely reflecting gallbladder sludge. Pancreas: Unremarkable. No pancreatic ductal dilatation or surrounding inflammatory changes. Spleen: Normal in size without focal abnormality. Adrenals/Urinary Tract: Normal adrenal glands. Two 15 mm hypodense, fluid attenuating left renal masses consistent with cysts. Kidneys are otherwise unremarkable. No  urolithiasis or obstructive uropathy. Stomach/Bowel: Stomach is within normal limits. Appendix appears normal. No evidence of bowel wall thickening, distention, or inflammatory changes. Vascular/Lymphatic: Normal caliber abdominal aorta with mild atherosclerosis. No lymphadenopathy. Reproductive: Prostate is unremarkable. Other: Small fat containing umbilical hernia.  No ascites. Musculoskeletal: No acute osseous abnormality. No aggressive osseous lesion. Moderate facet arthropathy at L5-S1. Review of the MIP images confirms the above findings. IMPRESSION: 1. No evidence pulmonary embolus. 2. No acute abnormality of the chest, abdomen or pelvis. 3. Severe hepatic steatosis. 4.  Aortic Atherosclerosis (ICD10-I70.0). Electronically Signed   By: Kathreen Devoid   On: 10/29/2018 12:22   Mr Abdomen Mrcp Wo Contrast  Result Date: 10/30/2018 CLINICAL DATA:  Inpatient. Chest pain. Dyspnea. Nausea and vomiting. Hepatic steatosis. Elevated liver function tests. EXAM: MRI ABDOMEN WITHOUT CONTRAST  (INCLUDING MRCP) TECHNIQUE: Multiplanar multisequence MR imaging of the abdomen was performed. Heavily T2-weighted images of the biliary and pancreatic ducts were obtained, and three-dimensional MRCP images were rendered by post processing. COMPARISON:  10/29/2018 CT abdomen/pelvis and abdominal sonogram. 10/09/2016 MRI abdomen. FINDINGS: Lower chest: No acute abnormality at the lung bases. Hepatobiliary: Normal liver size and configuration. Severe diffuse hepatic steatosis. No liver mass. Normal gallbladder with no cholelithiasis. No significant biliary ductal dilatation. Common bile duct diameter 6 mm. No evidence of choledocholithiasis. No biliary strictures, beading or masses. Pancreas: No pancreatic mass or duct dilation.  No pancreas divisum. Spleen: Normal size. No mass.  Splenic hemosiderosis. Adrenals/Urinary Tract: Normal adrenals. No hydronephrosis. No right renal masses. Simple appearing 2.6 cm anterior upper left renal  cyst. Indeterminate 2.7 cm interpolar left renal cortical lesion (series 4/image 30) with T1 hyperintensity and heterogeneous T2 hypointensity. Stomach/Bowel: Normal non-distended stomach. Visualized small and large bowel is normal caliber, with no bowel wall thickening. Vascular/Lymphatic: Normal caliber abdominal aorta. No pathologically enlarged lymph nodes in the abdomen. Other: No abdominal ascites or focal fluid collection. Musculoskeletal: No aggressive appearing focal osseous lesions. IMPRESSION: 1. Severe diffuse hepatic steatosis. No morphologic changes of hepatic cirrhosis. No liver masses. 2. No cholelithiasis. Bile ducts are within normal limits. CBD diameter 6 mm. No evidence of choledocholithiasis. 3. Indeterminate hemorrhagic/proteinaceous 2.7 cm interpolar left renal cortical lesion, incompletely characterized on this noncontrast MRI study. Suggest attention on follow-up outpatient MRI abdomen without and with IV  contrast in 3 months. 4. Splenic hemosiderosis. Electronically Signed   By: Ilona Sorrel M.D.   On: 10/30/2018 14:20   Mr 3d Recon At Scanner  Result Date: 10/30/2018 CLINICAL DATA:  Inpatient. Chest pain. Dyspnea. Nausea and vomiting. Hepatic steatosis. Elevated liver function tests. EXAM: MRI ABDOMEN WITHOUT CONTRAST  (INCLUDING MRCP) TECHNIQUE: Multiplanar multisequence MR imaging of the abdomen was performed. Heavily T2-weighted images of the biliary and pancreatic ducts were obtained, and three-dimensional MRCP images were rendered by post processing. COMPARISON:  10/29/2018 CT abdomen/pelvis and abdominal sonogram. 10/09/2016 MRI abdomen. FINDINGS: Lower chest: No acute abnormality at the lung bases. Hepatobiliary: Normal liver size and configuration. Severe diffuse hepatic steatosis. No liver mass. Normal gallbladder with no cholelithiasis. No significant biliary ductal dilatation. Common bile duct diameter 6 mm. No evidence of choledocholithiasis. No biliary strictures, beading  or masses. Pancreas: No pancreatic mass or duct dilation.  No pancreas divisum. Spleen: Normal size. No mass.  Splenic hemosiderosis. Adrenals/Urinary Tract: Normal adrenals. No hydronephrosis. No right renal masses. Simple appearing 2.6 cm anterior upper left renal cyst. Indeterminate 2.7 cm interpolar left renal cortical lesion (series 4/image 30) with T1 hyperintensity and heterogeneous T2 hypointensity. Stomach/Bowel: Normal non-distended stomach. Visualized small and large bowel is normal caliber, with no bowel wall thickening. Vascular/Lymphatic: Normal caliber abdominal aorta. No pathologically enlarged lymph nodes in the abdomen. Other: No abdominal ascites or focal fluid collection. Musculoskeletal: No aggressive appearing focal osseous lesions. IMPRESSION: 1. Severe diffuse hepatic steatosis. No morphologic changes of hepatic cirrhosis. No liver masses. 2. No cholelithiasis. Bile ducts are within normal limits. CBD diameter 6 mm. No evidence of choledocholithiasis. 3. Indeterminate hemorrhagic/proteinaceous 2.7 cm interpolar left renal cortical lesion, incompletely characterized on this noncontrast MRI study. Suggest attention on follow-up outpatient MRI abdomen without and with IV contrast in 3 months. 4. Splenic hemosiderosis. Electronically Signed   By: Ilona Sorrel M.D.   On: 10/30/2018 14:20   US Abdomen Limited  Result Date: 10/29/2018 CLINICAL DATA:  Vomiting.  Elevated liver function tests. EXAM: ULTRASOUND ABDOMEN LIMITED RIGHT UPPER QUADRANT COMPARISON:  None. FINDINGS: Gallbladder: No gallstones or wall thickening visualized. No sonographic Murphy sign noted by sonographer. Small amount of sludge in the gallbladder noted. Common bile duct: Diameter: 0.5 cm Liver: Echogenicity is increased consistent with fatty infiltration. No focal lesion. Portal vein is patent on color Doppler imaging with normal direction of blood flow towards the liver. IMPRESSION: Negative for gallstones or acute  cholecystitis. Small volume of sludge in the gallbladder noted. Fatty infiltration of the liver. Electronically Signed   By: Inge Rise M.D.   On: 10/29/2018 14:48   Anti-infectives (From admission, onward)   None        Assessment/Plan CAD s/p STEMI in 2018 with LCx PCI and recent PCI to mid RCA 05/2018 on plavix HTN CKD stage III HLD Hypothyroidism  Chest pain Nausea and vomiting Elevated LFTs - Patient with mildly elevated LFTs, chest pain, and nausea/vomiting. Abdomen completely benign on exam. Abd u/s shows small volume sludge but negative for gallstones or acute cholecystitis. MRCP showed severe diffuse hepatic steatosis, no cholelithiasis, CBD diameter 6 mm, and no evidence of choledocholithiasis. Story not consistent with cholecystitis, but will obtain HIDA scan for further evaluation.   ID - none VTE - SCDs FEN - IVF, NPO for scan Foley - none Follow up - TBD  [place], [facility] Surgery 10/31/2018, 10:00 AM Pager: [phone] Mon-Thurs 7:00 am-4:30 pm Fri 7:00 am -11:30 AM Sat-Sun 7:00  am-11:30 am

## 2018-10-31 NOTE — Discharge Summary (Signed)
Physician Discharge Summary  [name] YWV:[id number] DOB: Apr 22, 1961 DOA: 10/29/2018  PCP: [name], DO  Admit date: 10/29/2018 Discharge date: 11/01/2018  Time spent: 45 minutes  Recommendations for Outpatient Follow-up:  Patient will be discharged to home.  Patient will need to follow up with primary care provider within one week of discharge, repeat CBC and CMP.  Follow up with cardiology, gastroenterology, and general surgery. Patient should continue medications as prescribed.  Patient should follow a low fat/heart healthy diet.   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 Kathlene Cote Shearinis a 58 y.o.malewithhistory of CAD status post ending in February 2020, hypertension, chronic disease stage III, drug abuse, trigeminal neuralgia, hyperlipidemia hypothyroidism presents to the ER at [facility] with complaints of persistent chest pain with nausea vomiting over the last 3 days. Patient had a similar picture and presentation in April of this year when cardiac cath was done and was unremarkable. Patient stated chest pain is retrosternal stabbing in nature nonradiating present even at rest increased on exertion. No associated shortness of breath. Nausea vomiting is persistent has no relation to food and no blood in the vomitus. Had noted some blood in the urine.  Hospital Course:  Chest pain -Atypical, has been ongoing since Saturday, 10/26/2018 -Has had intermittent nausea and vomiting -Troponin currently cycled, 20, 29-appears flat -Echocardiogram 06/10/2018: EF of 60 to 65%.  LV diastolic Doppler parameters consistent with  impaired relaxation. (Grade 1 diastolic dysfunction) -Patient did have heart catheterization 07/24/2018 which showed short, widely patent left main.  Large LAD, irregularities up to 30% proximal.  Widely patent circumflex with mid vessel 20 to 30% eccentric narrowing.  EF 50%, low normal LV systolic function -CTA shows no evidence of pulmonary embolus -Cardiology consulted and appreciated- recommended GI workup. Do not feel that patient's symptoms are cardiac. -continues to complain of dyspnea with ambulation -patient has been receiving fentanyl for palpitations- and not true pain   Nausea and vomiting -possibly secondary to the above vs GI source -Continue antiemetics PRN -continues to have nausea -nausea usually starts about 45 minutes after liquid intake -see discussion below -Will discharge patient with antiemetics   Elevated LFTs/Bilirubin -Hepatitis panel unremarkable -INR 1.2 -Abdominal ultrasound: negative for gallstones or acute cholecystitis.  Small volume of sludge in the gallbladder.  Fatty infiltration of liver. -CT abdomen pelvis showed no acute abnormality of the chest, abdomen, pelvis.  Severe hepatic steatosis.  No intra-or extrahepatic biliary duct dilatation. -AST up to 227, ALT 125 on admission, Bilirubin up to 3.7 -- all are trending downward -Possibly due to dehydration from nausea and vomiting -MRCP: Severe diffuse hepatic steatosis.  No morphological changes of hepatic cirrhosis.  No liver masses.  No cholelithiasis.  Bile ducts are within normal limits.  CBD diameter 6 mm.  No evidence of choledocholithiasis. -Continue to monitor CMP and give IV fluids -HIDA: Nonvisualization of the gallbladder, felt to be indicative of cystic duct obstruction indicative of acute cholecystitis. -Gastroenterology consulted and appreciated-not planning to do EGD as patient had 25 May 2017 with similar issues and the EGD was essentially normal.  Seems that patient is safe to  discharge from GI perspective.  Follow-up in the office  in 4 to 6 weeks and repeat LFTs. -General surgery consulted and appreciated- Discussed with Dr. Windle Guard, feels patient should be followed as an outpatient to assess whether he needs to have his gallbladder removed.  Hematuria -Unknown cause -Hemoglobin currently stable, 13.1 -UA unremarkable for infection -CT abdomen pelvis with contrast showed 215 mm hypodense fluid attenuating left renal masses consistent with cysts.  Kidneys are otherwise unremarkable.  No urolithiasis or obstructive uropathy. -Continue to monitor  Thrombocytopenia -Chronic -Platelets currently 89, however appears that baseline has been low 100s -Continue to monitor  -no evidence of bleeding   Acute kidney injury vs CKD, stage II -Creatinine peaked to 1.76. Down to 1.33 today -Baseline creatinine has ranged from approximately 1.1 up to 1.4  Essential hypertension -Continue amlodipine, metoprolol -Hold Cozaar- may resume on discharge  Renal lesion -Noted on MRCP: Indeterminate hemorrhagic/proteinaceous 2.7 cm interpolar left renal cortical lesion.  Suggest outpatient MRI of the abdomen with and without contrast in 3 months.  GERD -currently on protonix -added famotidine during hospitalization as patient stated protonix does not work well for him  Hypokalemia -resolved with replacement  -obtained magnesium 2.1  Consultants Cardiology General sugery Gastroenterology  Procedures  Abdominal US MRCP HIDA  Discharge Exam: Vitals:   11/01/18 1128 11/01/18 1443  BP: 108/79 110/79  Pulse: 69 72  Resp: 18 14  Temp: 98 F (36.7 C) 98 F (36.7 C)  SpO2: 96% 95%     General: Well developed, well nourished, NAD, appears stated age  HEENT: NCAT, mucous membranes moist.  Cardiovascular: S1 S2 auscultated, RRR, no murmur  Respiratory: Clear to auscultation bilaterally with equal chest rise  Abdomen: Soft, nontender, nondistended, + bowel  sounds  Extremities: warm dry without cyanosis clubbing or edema  Neuro: AAOx3, nonfocal  Psych: Normal affect and demeanor   Discharge Instructions Discharge Instructions    Discharge instructions   Complete by: As directed    Patient will be discharged to home.  Patient will need to follow up with primary care provider within one week of discharge, repeat CBC and CMP.  Follow up with cardiology, gastroenterology, and general surgery. Patient should continue medications as prescribed.  Patient should follow a low fat/heart healthy diet.     Allergies as of 11/01/2018      Reactions   Tramadol Palpitations      Medication List    STOP taking these medications   rosuvastatin 40 MG tablet Commonly known as: CRESTOR     TAKE these medications   amLODipine 10 MG tablet Commonly known as: NORVASC Take 1 tablet (10 mg total) by mouth daily.   aspirin EC 81 MG tablet Take 81 mg by mouth daily.   calcium carbonate 750 MG chewable tablet Commonly known as: TUMS EX Chew 2 tablets by mouth as needed for heartburn.   clopidogrel 75 MG tablet Commonly known as: PLAVIX Take 75 mg by mouth daily.   Evolocumab 140 MG/ML Soaj Commonly known as: Repatha SureClick Inject 510 mg into the skin every 14 (fourteen) days.   ezetimibe 10 MG tablet Commonly known as: ZETIA Take 1 tablet (10 mg total) by mouth daily.   FLUoxetine 20 MG tablet Commonly known as: PROZAC Take 20 mg by mouth daily.   fluticasone 50 MCG/ACT nasal spray Commonly known as: FLONASE Place 2 sprays into both nostrils daily. What changed:   when to take this  reasons to take this   lansoprazole 30 MG capsule Commonly known as: PREVACID Take 1 capsule (30  mg total) by mouth 2 (two) times daily before a meal.   levocetirizine 5 MG tablet Commonly known as: XYZAL Take 1 tablet (5 mg total) by mouth every evening.   losartan 100 MG tablet Commonly known as: COZAAR Take 100 mg by mouth daily.     metoprolol succinate 50 MG 24 hr tablet Commonly known as: TOPROL-XL Take 1 tablet (50 mg total) by mouth daily. Take with or immediately following a meal.   nitroGLYCERIN 0.4 MG SL tablet Commonly known as: NITROSTAT Place 1 tablet (0.4 mg total) under the tongue every 5 (five) minutes as needed for chest pain.   ondansetron 4 MG tablet Commonly known as: ZOFRAN Take 1 tablet (4 mg total) by mouth every 6 (six) hours as needed for nausea.   promethazine 12.5 MG tablet Commonly known as: PHENERGAN Take 1 tablet (12.5 mg total) by mouth every 6 (six) hours as needed for refractory nausea / vomiting. What changed: reasons to take this   SYSTANE OP Place 1 drop into both eyes every 3 (three) hours as needed (dry eyes).   traZODone 50 MG tablet Commonly known as: DESYREL Take 0.5-1 tablets (25-50 mg total) by mouth at bedtime as needed for sleep.      Allergies  Allergen Reactions   Tramadol Palpitations   Follow-up [facility], [place]. Call.   Specialty: General Surgery Why: Call as needed if nausea persists to discuss the possibility of having your gallbladder removed Contact information: [address] [place], [place], DO. Schedule an appointment as soon as possible for a visit in 1 week(s).   Specialty: Family Medicine Why: Hospital follow up Contact information: [address] [place] [address] [phone]        Dorothy Spark, MD .   Specialty: Cardiology Contact information: [address] [place] [address] [phone]        Ladene Artist, MD. Schedule an appointment as soon as possible for a visit in 4 week(s).   Specialty: Gastroenterology Why: Hospital follow up Contact information: [address] [place] [address] [phone]            The results of significant diagnostics  from this hospitalization (including imaging, microbiology, ancillary and laboratory) are listed below for reference.    Significant Diagnostic Studies: Dg Chest 2 View  Result Date: 10/29/2018 CLINICAL DATA:  Shortness of breath EXAM: CHEST - 2 VIEW COMPARISON:  July 23, 2018 FINDINGS: There is slight bibasilar atelectasis. There is no edema or consolidation. The heart size and pulmonary vascularity are normal. No adenopathy. 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 IMPRESSION: Slight bibasilar atelectasis.  No edema or consolidation. Electronically Signed   By: Lowella Grip III M.D.   On: 10/29/2018 11:07   Ct Angio Chest Pe W/cm &/or Wo Cm  Result Date: 10/29/2018 CLINICAL DATA:  Chest pain, shortness of breath, tachycardia, no abdominal pain EXAM: CT ANGIOGRAPHY CHEST CT ABDOMEN AND PELVIS WITH CONTRAST TECHNIQUE: Multidetector CT imaging of the chest was performed using the standard protocol during bolus administration of intravenous contrast. Multiplanar CT image reconstructions and MIPs were obtained to evaluate the vascular anatomy. Multidetector CT imaging of the abdomen and pelvis was performed using the standard protocol during bolus administration of intravenous contrast. CONTRAST:  142mL OMNIPAQUE IOHEXOL 350 MG/ML SOLN COMPARISON:  None.  FINDINGS: CTA CHEST FINDINGS Cardiovascular: Satisfactory opacification of the pulmonary arteries to the segmental level. No evidence of pulmonary embolism. Normal heart size. No pericardial effusion. Thoracic aorta is normal in caliber. No thoracic aortic dissection. Mediastinum/Nodes: No enlarged mediastinal, hilar, or axillary lymph nodes. Thyroid gland, trachea, and esophagus demonstrate no significant findings. Lungs/Pleura: Lungs are clear. No pleural effusion or pneumothorax. Musculoskeletal: No acute osseous abnormality. No aggressive osseous lesion. Review of the MIP images confirms the above findings. CT ABDOMEN and PELVIS  FINDINGS Hepatobiliary: Diffuse low attenuation of the liver as can be seen with hepatic steatosis. No intrahepatic or extrahepatic biliary duct dilatation. No hepatic mass. High-density material within the gallbladder likely reflecting gallbladder sludge. Pancreas: Unremarkable. No pancreatic ductal dilatation or surrounding inflammatory changes. Spleen: Normal in size without focal abnormality. Adrenals/Urinary Tract: Normal adrenal glands. Two 15 mm hypodense, fluid attenuating left renal masses consistent with cysts. Kidneys are otherwise unremarkable. No urolithiasis or obstructive uropathy. Stomach/Bowel: Stomach is within normal limits. Appendix appears normal. No evidence of bowel wall thickening, distention, or inflammatory changes. Vascular/Lymphatic: Normal caliber abdominal aorta with mild atherosclerosis. No lymphadenopathy. Reproductive: Prostate is unremarkable. Other: Small fat containing umbilical hernia.  No ascites. Musculoskeletal: No acute osseous abnormality. No aggressive osseous lesion. Moderate facet arthropathy at L5-S1. Review of the MIP images confirms the above findings. IMPRESSION: 1. No evidence pulmonary embolus. 2. No acute abnormality of the chest, abdomen or pelvis. 3. Severe hepatic steatosis. 4.  Aortic Atherosclerosis (ICD10-I70.0). Electronically Signed   By: Kathreen Devoid   On: 10/29/2018 12:22   Ct Abdomen Pelvis W Contrast  Result Date: 10/29/2018 CLINICAL DATA:  Chest pain, shortness of breath, tachycardia, no abdominal pain EXAM: CT ANGIOGRAPHY CHEST CT ABDOMEN AND PELVIS WITH CONTRAST TECHNIQUE: Multidetector CT imaging of the chest was performed using the standard protocol during bolus administration of intravenous contrast. Multiplanar CT image reconstructions and MIPs were obtained to evaluate the vascular anatomy. Multidetector CT imaging of the abdomen and pelvis was performed using the standard protocol during bolus administration of intravenous contrast.  CONTRAST:  139mL OMNIPAQUE IOHEXOL 350 MG/ML SOLN COMPARISON:  None. FINDINGS: CTA CHEST FINDINGS Cardiovascular: Satisfactory opacification of the pulmonary arteries to the segmental level. No evidence of pulmonary embolism. Normal heart size. No pericardial effusion. Thoracic aorta is normal in caliber. No thoracic aortic dissection. Mediastinum/Nodes: No enlarged mediastinal, hilar, or axillary lymph nodes. Thyroid gland, trachea, and esophagus demonstrate no significant findings. Lungs/Pleura: Lungs are clear. No pleural effusion or pneumothorax. Musculoskeletal: No acute osseous abnormality. No aggressive osseous lesion. Review of the MIP images confirms the above findings. CT ABDOMEN and PELVIS FINDINGS Hepatobiliary: Diffuse low attenuation of the liver as can be seen with hepatic steatosis. No intrahepatic or extrahepatic biliary duct dilatation. No hepatic mass. High-density material within the gallbladder likely reflecting gallbladder sludge. Pancreas: Unremarkable. No pancreatic ductal dilatation or surrounding inflammatory changes. Spleen: Normal in size without focal abnormality. Adrenals/Urinary Tract: Normal adrenal glands. Two 15 mm hypodense, fluid attenuating left renal masses consistent with cysts. Kidneys are otherwise unremarkable. No urolithiasis or obstructive uropathy. Stomach/Bowel: Stomach is within normal limits. Appendix appears normal. No evidence of bowel wall thickening, distention, or inflammatory changes. Vascular/Lymphatic: Normal caliber abdominal aorta with mild atherosclerosis. No lymphadenopathy. Reproductive: Prostate is unremarkable. Other: Small fat containing umbilical hernia.  No ascites. Musculoskeletal: No acute osseous abnormality. No aggressive osseous lesion. Moderate facet arthropathy at L5-S1. Review of the MIP images confirms the above findings. IMPRESSION: 1. No evidence pulmonary embolus. 2. No acute abnormality  of the chest, abdomen or pelvis. 3. Severe hepatic  steatosis. 4.  Aortic Atherosclerosis (ICD10-I70.0). Electronically Signed   By: Kathreen Devoid   On: 10/29/2018 12:22   Mr Abdomen Mrcp Wo Contrast  Result Date: 10/30/2018 CLINICAL DATA:  Inpatient. Chest pain. Dyspnea. Nausea and vomiting. Hepatic steatosis. Elevated liver function tests. EXAM: MRI ABDOMEN WITHOUT CONTRAST  (INCLUDING MRCP) TECHNIQUE: Multiplanar multisequence MR imaging of the abdomen was performed. Heavily T2-weighted images of the biliary and pancreatic ducts were obtained, and three-dimensional MRCP images were rendered by post processing. COMPARISON:  10/29/2018 CT abdomen/pelvis and abdominal sonogram. 10/09/2016 MRI abdomen. FINDINGS: Lower chest: No acute abnormality at the lung bases. Hepatobiliary: Normal liver size and configuration. Severe diffuse hepatic steatosis. No liver mass. Normal gallbladder with no cholelithiasis. No significant biliary ductal dilatation. Common bile duct diameter 6 mm. No evidence of choledocholithiasis. No biliary strictures, beading or masses. Pancreas: No pancreatic mass or duct dilation.  No pancreas divisum. Spleen: Normal size. No mass.  Splenic hemosiderosis. Adrenals/Urinary Tract: Normal adrenals. No hydronephrosis. No right renal masses. Simple appearing 2.6 cm anterior upper left renal cyst. Indeterminate 2.7 cm interpolar left renal cortical lesion (series 4/image 30) with T1 hyperintensity and heterogeneous T2 hypointensity. Stomach/Bowel: Normal non-distended stomach. Visualized small and large bowel is normal caliber, with no bowel wall thickening. Vascular/Lymphatic: Normal caliber abdominal aorta. No pathologically enlarged lymph nodes in the abdomen. Other: No abdominal ascites or focal fluid collection. Musculoskeletal: No aggressive appearing focal osseous lesions. IMPRESSION: 1. Severe diffuse hepatic steatosis. No morphologic changes of hepatic cirrhosis. No liver masses. 2. No cholelithiasis. Bile ducts are within normal limits. CBD  diameter 6 mm. No evidence of choledocholithiasis. 3. Indeterminate hemorrhagic/proteinaceous 2.7 cm interpolar left renal cortical lesion, incompletely characterized on this noncontrast MRI study. Suggest attention on follow-up outpatient MRI abdomen without and with IV contrast in 3 months. 4. Splenic hemosiderosis. Electronically Signed   By: Ilona Sorrel M.D.   On: 10/30/2018 14:20   Mr 3d Recon At Scanner  Result Date: 10/30/2018 CLINICAL DATA:  Inpatient. Chest pain. Dyspnea. Nausea and vomiting. Hepatic steatosis. Elevated liver function tests. EXAM: MRI ABDOMEN WITHOUT CONTRAST  (INCLUDING MRCP) TECHNIQUE: Multiplanar multisequence MR imaging of the abdomen was performed. Heavily T2-weighted images of the biliary and pancreatic ducts were obtained, and three-dimensional MRCP images were rendered by post processing. COMPARISON:  10/29/2018 CT abdomen/pelvis and abdominal sonogram. 10/09/2016 MRI abdomen. FINDINGS: Lower chest: No acute abnormality at the lung bases. Hepatobiliary: Normal liver size and configuration. Severe diffuse hepatic steatosis. No liver mass. Normal gallbladder with no cholelithiasis. No significant biliary ductal dilatation. Common bile duct diameter 6 mm. No evidence of choledocholithiasis. No biliary strictures, beading or masses. Pancreas: No pancreatic mass or duct dilation.  No pancreas divisum. Spleen: Normal size. No mass.  Splenic hemosiderosis. Adrenals/Urinary Tract: Normal adrenals. No hydronephrosis. No right renal masses. Simple appearing 2.6 cm anterior upper left renal cyst. Indeterminate 2.7 cm interpolar left renal cortical lesion (series 4/image 30) with T1 hyperintensity and heterogeneous T2 hypointensity. Stomach/Bowel: Normal non-distended stomach. Visualized small and large bowel is normal caliber, with no bowel wall thickening. Vascular/Lymphatic: Normal caliber abdominal aorta. No pathologically enlarged lymph nodes in the abdomen. Other: No abdominal ascites  or focal fluid collection. Musculoskeletal: No aggressive appearing focal osseous lesions. IMPRESSION: 1. Severe diffuse hepatic steatosis. No morphologic changes of hepatic cirrhosis. No liver masses. 2. No cholelithiasis. Bile ducts are within normal limits. CBD diameter 6 mm. No evidence of choledocholithiasis. 3. Indeterminate hemorrhagic/proteinaceous 2.7  cm interpolar left renal cortical lesion, incompletely characterized on this noncontrast MRI study. Suggest attention on follow-up outpatient MRI abdomen without and with IV contrast in 3 months. 4. Splenic hemosiderosis. Electronically Signed   By: Ilona Sorrel M.D.   On: 10/30/2018 14:20   Nm Hepato W/eject Fract  Result Date: 11/01/2018 CLINICAL DATA:  Elevated liver enzymes. Abnormal gallbladder appearance on CT EXAM: NUCLEAR MEDICINE HEPATOBILIARY IMAGING TECHNIQUE: Sequential images of the abdomen were obtained out to 90 minutes following intravenous administration of radiopharmaceutical. Patient unable to receive opioids. Delayed 6 hour image therefore obtained. RADIOPHARMACEUTICALS:  5.2 mCi Tc-50m  Choletec IV COMPARISON:  None. FINDINGS: Liver uptake of radiotracer is unremarkable. There is prompt visualization of small bowel, indicating patency of the common bile duct. Gallbladder did not visualize during the course of this study. IMPRESSION: Nonvisualization of the gallbladder, a finding felt to be indicative of cystic duct obstruction. Cystic duct obstruction is indicative of acute cholecystitis. Common bile duct is patent as is evidenced by visualization of small bowel. Electronically Signed   By: Lowella Grip III M.D.   On: 11/01/2018 08:14   US Abdomen Limited  Result Date: 10/29/2018 CLINICAL DATA:  Vomiting.  Elevated liver function tests. EXAM: ULTRASOUND ABDOMEN LIMITED RIGHT UPPER QUADRANT COMPARISON:  None. FINDINGS: Gallbladder: No gallstones or wall thickening visualized. No sonographic Murphy sign noted by sonographer.  Small amount of sludge in the gallbladder noted. Common bile duct: Diameter: 0.5 cm Liver: Echogenicity is increased consistent with fatty infiltration. No focal lesion. Portal vein is patent on color Doppler imaging with normal direction of blood flow towards the liver. IMPRESSION: Negative for gallstones or acute cholecystitis. Small volume of sludge in the gallbladder noted. Fatty infiltration of the liver. Electronically Signed   By: Inge Rise M.D.   On: 10/29/2018 14:48    Microbiology: Recent Results (from the past 240 hour(s))  SARS Coronavirus 2 (Hosp order,Performed in [facility] lab via [employer] ID)     Status: None   Collection Time: 10/29/18 11:36 AM   Specimen: Dry Nasal Swab ([employer] ID Now)  Result Value Ref Range Status   SARS Coronavirus 2 ([employer] Now) NEGATIVE NEGATIVE Final    Comment: (NOTE) SARS-CoV-2 target nucleic acids are NOT DETECTED. The SARS-CoV-2 RNA is generally detectable in upper and lower respiratory specimens during the acute phase of infection.  Negativeresults do not preclude SARS-CoV-2 infection, do not rule out coinfections with other pathogens, and should not be used as the  sole basis for treatment or other patient management decisions.  Negative results must be combined with clinical observations, patient history, and epidemiological information. The expected result is Negative. Fact Sheet for Patients: GolfingFamily.no Fact Sheet for Healthcare Providers: https://www.hernandez-brewer.com/ This test is not yet approved or cleared by the [place] FDA and  has been authorized for detection and/or diagnosis of SARS-CoV-2 by FDA under an Emergency Use Authorization (EUA).  This EUA will remain in effect (meaning this test can be used) for the duration of  the COVID19 declaration under Section 5 64(b)(1) of the Act, 21 U.S.C.  section [phone] 3(b)(1), unless the authorization is terminated or revoked  sooner. Performed at [facility], [address] [place], [place] [address]   Urine culture     Status: None   Collection Time: 10/29/18  5:07 PM   Specimen: Urine, Random  Result Value Ref Range Status   Specimen Description   Final    URINE, RANDOM Performed at [facility], [address] [place], [place] [address]    Special Requests   Final    NONE Performed at [facility], [address] [place], [place] [address]    Culture   Final    NO GROWTH Performed at [facility], [place] [address] [place], [place] [address]    Report Status 10/30/2018 FINAL  Final  MRSA PCR Screening     Status: None   Collection Time: 10/29/18  8:07 PM   Specimen: Nasal Mucosa; Nasopharyngeal  Result Value Ref Range Status   MRSA by PCR NEGATIVE NEGATIVE Final    Comment:        The GeneXpert MRSA Assay (FDA approved for NASAL specimens only), is one component of a comprehensive MRSA colonization surveillance program. It is not intended to diagnose MRSA infection nor to guide or monitor treatment for MRSA infections. Performed at [facility], [place] [address], [place], [place] [address]      Labs: Basic Metabolic Panel: Recent Labs  Lab 10/29/18 1003 10/29/18 1520 10/30/18 0037 10/31/18 0303 11/01/18 0219 11/01/18 1715  NA 132* 132* 131* 132* 135  --   K 4.1 4.0 3.3* 3.6 2.9* 3.7  CL 90* 96* 97* 99 100  --   CO2 11* 13* 15* 17* 23  --   GLUCOSE 116* 115* 135* 111* 145*  --   BUN 23* 27* 23* 19 15  --   CREATININE 1.44* 1.72* 1.76* 1.39* 1.33*  --   CALCIUM 10.3 8.8* 9.2 8.8* 9.2  --   MG  --   --   --   --  2.1  --    Liver Function Tests: Recent Labs  Lab 10/29/18 1003 10/30/18 0037 10/31/18 0303 11/01/18 0219  AST 227* 177* 137* 102*  ALT 125* 92* 72* 66*  ALKPHOS 70 53 55 72  BILITOT 3.7* 3.2* 2.1* 1.3*  PROT 9.6* 7.3 6.5 6.5  ALBUMIN 5.0 3.6 3.1* 3.1*   No results for input(s): LIPASE, AMYLASE in the last 168  hours. No results for input(s): AMMONIA in the last 168 hours. CBC: Recent Labs  Lab 10/29/18 1003 10/30/18 0037 10/30/18 0557 10/30/18 0838 10/31/18 0303 11/01/18 0219  WBC 6.7 6.1 6.0 6.7 5.6 6.1  NEUTROABS 4.7 4.1  --   --   --   --   HGB 16.4 13.8 14.0 14.7 13.4 13.1  HCT 46.9 38.6* 39.4 40.4 38.1* 36.9*  MCV 99.2 97.2 97.8 95.3 99.2 97.6  PLT 85* 67* 62* 55* 61* 89*   Cardiac Enzymes: Recent Labs  Lab 10/29/18 1642  CKTOTAL 241   BNP: BNP (last 3 results) Recent Labs    10/29/18 1003  BNP 37.0    ProBNP (last 3 results) No results for input(s): PROBNP in the last 8760 hours.  CBG: No results for input(s): GLUCAP in the last 168 hours.     Signed:  Cristal Ford  [facility] 11/01/2018, 6:04 PM

## 2018-10-31 NOTE — Progress Notes (Signed)
DAILY PROGRESS NOTE   Patient Name: [name] Date of Encounter: 10/31/2018 Cardiologist: Ena Dawley, MD  Chief Complaint   No chest pain  Patient Profile   [name] is a 58 y.o. male with a hx of CAD s/p STEMI in 2018 w/ LCx PCI and recent PCI to the mid RCA 05/2018, issues with atypical CP since cath with repeat cath in April showing patent stents and nonobstructive CAD, HFpEF and DLD who is being seen today for the evaluation of recurrent CP at the request of Dr. Ree Kida, Internal Medicine.    Subjective   Labs improved today. MRCP not remarkable. No chest pain. Gets short of breath walking with increase in HR.  Objective   Vitals:   10/30/18 1930 10/30/18 2324 10/31/18 0314 10/31/18 0736  BP: 119/74 107/76 107/83   Pulse: 75   62  Resp: 16 16    Temp: 97.6 F (36.4 C) 98 F (36.7 C) 98.4 F (36.9 C) 97.6 F (36.4 C)  TempSrc: Oral Oral Oral Oral  SpO2: 96% 98% 96% 96%  Weight:      Height:        Intake/Output Summary (Last 24 hours) at 10/31/2018 0921 Last data filed at 10/30/2018 2300 Gross per 24 hour  Intake 1769.56 ml  Output --  Net 1769.56 ml   Filed Weights   10/29/18 0954 10/29/18 2100  Weight: 105.2 kg 102.7 kg    Physical Exam   General appearance: alert and no distress Lungs: clear to auscultation bilaterally Heart: regular rate and rhythm Extremities: extremities normal, atraumatic, no cyanosis or edema Neurologic: Grossly normal  Inpatient Medications    Scheduled Meds:  amLODipine  10 mg Oral Daily   aspirin EC  81 mg Oral Daily   chlorhexidine  15 mL Mouth Rinse BID   clopidogrel  75 mg Oral Daily   ezetimibe  10 mg Oral Daily   FLUoxetine  20 mg Oral Daily   losartan  100 mg Oral Daily   mouth rinse  15 mL Mouth Rinse q12n4p   metoprolol succinate  50 mg Oral Daily   pantoprazole  20 mg Oral Daily    Continuous Infusions:   PRN Meds: fentaNYL (SUBLIMAZE) injection, nitroGLYCERIN, ondansetron  **OR** ondansetron (ZOFRAN) IV, traZODone   Labs   Results for orders placed or performed during the hospital encounter of 10/29/18 (from the past 48 hour(s))  Comprehensive metabolic panel     Status: Abnormal   Collection Time: 10/29/18 10:03 AM  Result Value Ref Range   Sodium 132 (L) 135 - 145 mmol/L   Potassium 4.1 3.5 - 5.1 mmol/L   Chloride 90 (L) 98 - 111 mmol/L   CO2 11 (L) 22 - 32 mmol/L   Glucose, Bld 116 (H) 70 - 99 mg/dL   BUN 23 (H) 6 - 20 mg/dL   Creatinine, Ser 1.44 (H) 0.61 - 1.24 mg/dL   Calcium 10.3 8.9 - 10.3 mg/dL   Total Protein 9.6 (H) 6.5 - 8.1 g/dL   Albumin 5.0 3.5 - 5.0 g/dL   AST 227 (H) 15 - 41 U/L   ALT 125 (H) 0 - 44 U/L   Alkaline Phosphatase 70 38 - 126 U/L   Total Bilirubin 3.7 (H) 0.3 - 1.2 mg/dL   GFR calc non Af Amer 54 (L) >60 mL/min   GFR calc Af Amer >60 >60 mL/min   Anion gap 31 (H) 5 - 15    Comment: REPEATED TO VERIFY Performed at [facility], [address] [place], [place] [address]   Troponin I (High Sensitivity)     Status: Abnormal   Collection Time: 10/29/18 10:03 AM  Result Value Ref Range   Troponin I (High Sensitivity) 20 (H) <18 ng/L    Comment: (NOTE) Elevated high sensitivity troponin I (hsTnI) values and significant  changes across serial measurements may suggest ACS but many other  chronic and acute conditions are known to elevate hsTnI results.  Refer to the "Links" section for chest pain algorithms and additional  guidance. Performed at [facility], [address] [place], [place] [address]   Brain natriuretic peptide     Status: None   Collection Time: 10/29/18 10:03 AM  Result Value Ref Range   B Natriuretic Peptide 37.0 0.0 - 100.0 pg/mL    Comment: Performed at [facility], [address] [place], [place] [address]  CBC with Differential     Status: Abnormal   Collection Time: 10/29/18 10:03 AM  Result Value Ref Range   WBC 6.7 4.0 - 10.5 K/uL   RBC 4.73 4.22 - 5.81  MIL/uL   Hemoglobin 16.4 13.0 - 17.0 g/dL   HCT 46.9 39.0 - 52.0 %   MCV 99.2 80.0 - 100.0 fL   MCH 34.7 (H) 26.0 - 34.0 pg   MCHC 35.0 30.0 - 36.0 g/dL   RDW 14.3 11.5 - 15.5 %   Platelets 85 (L) 150 - 400 K/uL    Comment: Immature Platelet Fraction may be clinically indicated, consider ordering this additional test [id number]    nRBC 0.6 (H) 0.0 - 0.2 %   Neutrophils Relative % 70 %   Neutro Abs 4.7 1.7 - 7.7 K/uL   Lymphocytes Relative 18 %   Lymphs Abs 1.2 0.7 - 4.0 K/uL   Monocytes Relative 11 %   Monocytes Absolute 0.8 0.1 - 1.0 K/uL   Eosinophils Relative 0 %   Eosinophils Absolute 0.0 0.0 - 0.5 K/uL   Basophils Relative 0 %   Basophils Absolute 0.0 0.0 - 0.1 K/uL   Immature Granulocytes 1 %   Abs Immature Granulocytes 0.07 0.00 - 0.07 K/uL    Comment: Performed at [facility], [address] [place], [place] [address]  D-dimer, quantitative     Status: Abnormal   Collection Time: 10/29/18 10:03 AM  Result Value Ref Range   D-Dimer, Quant 3.89 (H) 0.00 - 0.50 ug/mL-FEU    Comment: (NOTE) At the manufacturer cut-off of 0.50 ug/mL FEU, this assay has been documented to exclude PE with a sensitivity and negative predictive value of 97 to 99%.  At this time, this assay has not been approved by the FDA to exclude DVT/VTE. Results should be correlated with clinical presentation. Performed at [facility], [address] [place], [place] [address]   SARS Coronavirus 2 (Hosp order,Performed in [facility] lab via Abbott ID)     Status: None   Collection Time: 10/29/18 11:36 AM   Specimen: Dry Nasal Swab (Abbott ID Now)  Result Value Ref Range   SARS Coronavirus 2 (Abbott ID Now) NEGATIVE NEGATIVE    Comment: (NOTE) SARS-CoV-2 target nucleic acids are NOT DETECTED. The SARS-CoV-2 RNA is generally detectable in upper and lower respiratory specimens during the acute phase of infection.  Negativeresults do not preclude SARS-CoV-2 infection, do not  rule out coinfections with other pathogens, and should not be used as the  sole  basis for treatment or other patient management decisions.  Negative results must be combined with clinical observations, patient history, and epidemiological information. The expected result is Negative. Fact Sheet for Patients: GolfingFamily.no Fact Sheet for Healthcare Providers: https://www.hernandez-brewer.com/ This test is not yet approved or cleared by the [place] FDA and  has been authorized for detection and/or diagnosis of SARS-CoV-2 by FDA under an Emergency Use Authorization (EUA).  This EUA will remain in effect (meaning this test can be used) for the duration of  the COVID19 declaration under Section 5 64(b)(1) of the Act, 21 U.S.C.  section [phone] 3(b)(1), unless the authorization is terminated or revoked sooner. Performed at [facility], [address] [place], [place] [address]   Troponin I (High Sensitivity)     Status: Abnormal   Collection Time: 10/29/18 11:37 AM  Result Value Ref Range   Troponin I (High Sensitivity) 29 (H) <18 ng/L    Comment: (NOTE) Elevated high sensitivity troponin I (hsTnI) values and significant  changes across serial measurements may suggest ACS but many other  chronic and acute conditions are known to elevate hsTnI results.  Refer to the "Links" section for chest pain algorithms and additional  guidance. Performed at [facility], [address] [place], [place] [address]   Basic metabolic panel     Status: Abnormal   Collection Time: 10/29/18  3:20 PM  Result Value Ref Range   Sodium 132 (L) 135 - 145 mmol/L   Potassium 4.0 3.5 - 5.1 mmol/L   Chloride 96 (L) 98 - 111 mmol/L   CO2 13 (L) 22 - 32 mmol/L   Glucose, Bld 115 (H) 70 - 99 mg/dL   BUN 27 (H) 6 - 20 mg/dL   Creatinine, Ser 1.72 (H) 0.61 - 1.24 mg/dL   Calcium 8.8 (L) 8.9 - 10.3 mg/dL   GFR calc non Af Amer 44 (L) >60 mL/min    GFR calc Af Amer 51 (L) >60 mL/min   Anion gap 23 (H) 5 - 15    Comment: Performed at [facility], [address] [place], [place] [address]  Hepatitis panel, acute     Status: None   Collection Time: 10/29/18  3:50 PM  Result Value Ref Range   Hepatitis B Surface Ag Negative Negative   HCV Ab <0.1 0.0 - 0.9 s/co ratio    Comment: (NOTE)                                  Negative:     < 0.8                             Indeterminate: 0.8 - 0.9                                  Positive:     > 0.9 The CDC recommends that a positive HCV antibody result be followed up with a HCV Nucleic Acid Amplification test ([id number]). Performed At: [facility] [place], [place] [id number] Rush Farmer MD [id number]    Hep A IgM Negative Negative   Hep B C IgM Negative Negative  Ethanol     Status: None   Collection Time: 10/29/18  3:50 PM  Result Value Ref Range  Alcohol, Ethyl (B) <10 <10 mg/dL    Comment:        LOWEST DETECTABLE LIMIT FOR SERUM ALCOHOL IS 10 mg/dL FOR MEDICAL PURPOSES ONLY Performed at [facility], [address] [place], [place] [address]   APTT     Status: None   Collection Time: 10/29/18  3:50 PM  Result Value Ref Range   aPTT 27 24 - 36 seconds    Comment: Performed at [facility], [address] [place], [place] [address]  Protime-INR     Status: None   Collection Time: 10/29/18  3:50 PM  Result Value Ref Range   Prothrombin Time 14.9 11.4 - 15.2 seconds   INR 1.2 0.8 - 1.2    Comment: (NOTE) INR goal varies based on device and disease states. Performed at [facility], [address] [place], [place] [address]   Lactic acid, plasma     Status: None   Collection Time: 10/29/18  3:54 PM  Result Value Ref Range   Lactic Acid, Venous 1.6 0.5 - 1.9 mmol/L    Comment: Performed at [facility], [address] [place], [place] [address]  CK     Status: None   Collection Time:  10/29/18  4:42 PM  Result Value Ref Range   Total CK 241 49 - 397 U/L    Comment: Performed at [facility], [address] [place], [place] [address]  Urine culture     Status: None   Collection Time: 10/29/18  5:07 PM   Specimen: Urine, Random  Result Value Ref Range   Specimen Description      URINE, RANDOM Performed at [facility], [address] [place], [place] [address]    Special Requests      NONE Performed at [facility], [address] [place], [place] [address]    Culture      NO GROWTH Performed at [facility], [place] [address] [place], [place] [address]    Report Status 10/30/2018 FINAL   Urinalysis, Routine w reflex microscopic     Status: Abnormal   Collection Time: 10/29/18  5:07 PM  Result Value Ref Range   Color, Urine BROWN (A) YELLOW    Comment: BIOCHEMICALS MAY BE AFFECTED BY COLOR   APPearance HAZY (A) CLEAR   Specific Gravity, Urine 1.015 1.005 - 1.030   pH 6.0 5.0 - 8.0   Glucose, UA NEGATIVE NEGATIVE mg/dL   Hgb urine dipstick MODERATE (A) NEGATIVE   Bilirubin Urine MODERATE (A) NEGATIVE   Ketones, ur 40 (A) NEGATIVE mg/dL   Protein, ur 100 (A) NEGATIVE mg/dL   Nitrite NEGATIVE NEGATIVE   Leukocytes,Ua NEGATIVE NEGATIVE    Comment: Performed at [facility], [address] [place], [place] [address]  Urinalysis, Microscopic (reflex)     Status: Abnormal   Collection Time: 10/29/18  5:07 PM  Result Value Ref Range   RBC / HPF 6-10 0 - 5 RBC/hpf   WBC, UA 0-5 0 - 5 WBC/hpf   Bacteria, UA RARE (A) NONE SEEN   Squamous Epithelial / LPF 6-10 0 - 5   Hyaline Casts, UA PRESENT     Comment: Performed at [facility], [address], [place], [place] [address]  MRSA PCR Screening     Status: None   Collection Time: 10/29/18  8:07 PM  Specimen: Nasal Mucosa; Nasopharyngeal  Result Value Ref Range   MRSA by PCR NEGATIVE NEGATIVE    Comment:        The GeneXpert MRSA Assay  (FDA approved for NASAL specimens only), is one component of a comprehensive MRSA colonization surveillance program. It is not intended to diagnose MRSA infection nor to guide or monitor treatment for MRSA infections. Performed at [facility], [place] [address] [place], [place] [address]   Heparin level (unfractionated)     Status: None   Collection Time: 10/29/18 10:36 PM  Result Value Ref Range   Heparin Unfractionated 0.53 0.30 - 0.70 IU/mL    Comment: (NOTE) If heparin results are below expected values, and patient dosage has  been confirmed, suggest follow up testing of antithrombin III levels. Performed at [facility], [place] [address] [place], [place] [address]   Type and screen [place]     Status: None   Collection Time: 10/30/18 12:36 AM  Result Value Ref Range   ABO/RH(D) B POS    Antibody Screen NEG    Sample Expiration      11/02/2018,2359 Performed at [facility], [place] [address], [place], [place] [address]   Basic metabolic panel     Status: Abnormal   Collection Time: 10/30/18 12:37 AM  Result Value Ref Range   Sodium 131 (L) 135 - 145 mmol/L   Potassium 3.3 (L) 3.5 - 5.1 mmol/L   Chloride 97 (L) 98 - 111 mmol/L   CO2 15 (L) 22 - 32 mmol/L   Glucose, Bld 135 (H) 70 - 99 mg/dL   BUN 23 (H) 6 - 20 mg/dL   Creatinine, Ser 1.76 (H) 0.61 - 1.24 mg/dL   Calcium 9.2 8.9 - 10.3 mg/dL   GFR calc non Af Amer 43 (L) >60 mL/min   GFR calc Af Amer 49 (L) >60 mL/min   Anion gap 19 (H) 5 - 15    Comment: Performed at [facility], [address], [place], [place] [address]  Hepatic function panel     Status: Abnormal   Collection Time: 10/30/18 12:37 AM  Result Value Ref Range   Total Protein 7.3 6.5 - 8.1 g/dL   Albumin 3.6 3.5 - 5.0 g/dL   AST 177 (H) 15 - 41 U/L   ALT 92 (H) 0 - 44 U/L   Alkaline Phosphatase 53 38 - 126 U/L   Total Bilirubin 3.2 (H) 0.3 - 1.2 mg/dL   Bilirubin, Direct 1.2 (H) 0.0 - 0.2 mg/dL    Indirect Bilirubin 2.0 (H) 0.3 - 0.9 mg/dL    Comment: Performed at [facility] [address] [place], [place] [address]  CBC WITH DIFFERENTIAL     Status: Abnormal   Collection Time: 10/30/18 12:37 AM  Result Value Ref Range   WBC 6.1 4.0 - 10.5 K/uL   RBC 3.97 (L) 4.22 - 5.81 MIL/uL   Hemoglobin 13.8 13.0 - 17.0 g/dL   HCT 38.6 (L) 39.0 - 52.0 %   MCV 97.2 80.0 - 100.0 fL   MCH 34.8 (H) 26.0 - 34.0 pg   MCHC 35.8 30.0 - 36.0 g/dL   RDW 14.5 11.5 - 15.5 %   Platelets 67 (L) 150 - 400 K/uL    Comment: REPEATED TO VERIFY PLATELET COUNT CONFIRMED BY SMEAR Immature Platelet Fraction may be clinically indicated, consider ordering this additional test [id number]    nRBC 1.0 (H) 0.0 -  0.2 %   Neutrophils Relative % 66 %   Neutro Abs 4.1 1.7 - 7.7 K/uL   Lymphocytes Relative 20 %   Lymphs Abs 1.2 0.7 - 4.0 K/uL   Monocytes Relative 12 %   Monocytes Absolute 0.7 0.1 - 1.0 K/uL   Eosinophils Relative 0 %   Eosinophils Absolute 0.0 0.0 - 0.5 K/uL   Basophils Relative 1 %   Basophils Absolute 0.0 0.0 - 0.1 K/uL   Immature Granulocytes 1 %   Abs Immature Granulocytes 0.03 0.00 - 0.07 K/uL    Comment: Performed at [facility] [address], [place], [place] [address]  Lactic acid, plasma     Status: None   Collection Time: 10/30/18 12:37 AM  Result Value Ref Range   Lactic Acid, Venous 1.3 0.5 - 1.9 mmol/L    Comment: Performed at [facility] [address] [place] [address] [place], [place] [address]  Acetaminophen level     Status: Abnormal   Collection Time: 10/30/18 12:37 AM  Result Value Ref Range   Acetaminophen (Tylenol), Serum <10 (L) 10 - 30 ug/mL    Comment: (NOTE) Therapeutic concentrations vary significantly. A range of 10-30 ug/mL  may be an effective concentration for many patients. However, some  are best treated at concentrations outside of this range. Acetaminophen concentrations >150 ug/mL at 4 hours after ingestion  and >50 ug/mL at 12 hours after  ingestion are often associated with  toxic reactions. Performed at [facility], [place] [address], [place], [place] [address]   Lactic acid, plasma     Status: None   Collection Time: 10/30/18  3:52 AM  Result Value Ref Range   Lactic Acid, Venous 1.2 0.5 - 1.9 mmol/L    Comment: Performed at [facility] [address] [place], [place] [address]  CBC     Status: Abnormal   Collection Time: 10/30/18  5:57 AM  Result Value Ref Range   WBC 6.0 4.0 - 10.5 K/uL   RBC 4.03 (L) 4.22 - 5.81 MIL/uL   Hemoglobin 14.0 13.0 - 17.0 g/dL   HCT 39.4 39.0 - 52.0 %   MCV 97.8 80.0 - 100.0 fL   MCH 34.7 (H) 26.0 - 34.0 pg   MCHC 35.5 30.0 - 36.0 g/dL   RDW 14.6 11.5 - 15.5 %   Platelets 62 (L) 150 - 400 K/uL    Comment: REPEATED TO VERIFY Immature Platelet Fraction may be clinically indicated, consider ordering this additional test [id number] CONSISTENT WITH PREVIOUS RESULT    nRBC 1.0 (H) 0.0 - 0.2 %    Comment: Performed at [facility], [place] [address], [place], [place] [address]  CBC     Status: Abnormal   Collection Time: 10/30/18  8:38 AM  Result Value Ref Range   WBC 6.7 4.0 - 10.5 K/uL   RBC 4.24 4.22 - 5.81 MIL/uL   Hemoglobin 14.7 13.0 - 17.0 g/dL   HCT 40.4 39.0 - 52.0 %   MCV 95.3 80.0 - 100.0 fL   MCH 34.7 (H) 26.0 - 34.0 pg   MCHC 36.4 (H) 30.0 - 36.0 g/dL   RDW 14.6 11.5 - 15.5 %   Platelets 55 (L) 150 - 400 K/uL    Comment: REPEATED TO VERIFY Immature Platelet Fraction may be clinically indicated, consider ordering this additional test [id number] CONSISTENT WITH PREVIOUS RESULT    nRBC 1.0 (H) 0.0 - 0.2 %    Comment: Performed at  [facility], [place] [address], [place], [place] [address]  Urine rapid drug screen (hosp performed)     Status: None   Collection Time: 10/30/18  9:45 AM  Result Value Ref Range   Opiates NONE DETECTED NONE DETECTED   Cocaine NONE DETECTED NONE DETECTED   Benzodiazepines NONE DETECTED NONE DETECTED   Amphetamines  NONE DETECTED NONE DETECTED   Tetrahydrocannabinol NONE DETECTED NONE DETECTED   Barbiturates NONE DETECTED NONE DETECTED    Comment: (NOTE) DRUG SCREEN FOR MEDICAL PURPOSES ONLY.  IF CONFIRMATION IS NEEDED FOR ANY PURPOSE, NOTIFY LAB WITHIN 5 DAYS. LOWEST DETECTABLE LIMITS FOR URINE DRUG SCREEN Drug Class                     Cutoff (ng/mL) Amphetamine and metabolites    1000 Barbiturate and metabolites    200 Benzodiazepine                 528 Tricyclics and metabolites     300 Opiates and metabolites        300 Cocaine and metabolites        300 THC                            50 Performed at [facility], [place] [address] [place], [place] [address]   Gamma GT     Status: Abnormal   Collection Time: 10/30/18  3:09 PM  Result Value Ref Range   GGT 1,216 (H) 7 - 50 U/L    Comment: Performed at [facility], [place] [address], [place], [place] [address]  CBC     Status: Abnormal   Collection Time: 10/31/18  3:03 AM  Result Value Ref Range   WBC 5.6 4.0 - 10.5 K/uL   RBC 3.84 (L) 4.22 - 5.81 MIL/uL   Hemoglobin 13.4 13.0 - 17.0 g/dL   HCT 38.1 (L) 39.0 - 52.0 %   MCV 99.2 80.0 - 100.0 fL   MCH 34.9 (H) 26.0 - 34.0 pg   MCHC 35.2 30.0 - 36.0 g/dL   RDW 14.5 11.5 - 15.5 %   Platelets 61 (L) 150 - 400 K/uL    Comment: REPEATED TO VERIFY Immature Platelet Fraction may be clinically indicated, consider ordering this additional test [id number] CONSISTENT WITH PREVIOUS RESULT    nRBC 0.5 (H) 0.0 - 0.2 %    Comment: Performed at [facility], [place] [address], [place],  [address]  Comprehensive metabolic panel     Status: Abnormal   Collection Time: 10/31/18  3:03 AM  Result Value Ref Range   Sodium 132 (L) 135 - 145 mmol/L   Potassium 3.6 3.5 - 5.1 mmol/L   Chloride 99 98 - 111 mmol/L   CO2 17 (L) 22 - 32 mmol/L   Glucose, Bld 111 (H) 70 - 99 mg/dL   BUN 19 6 - 20 mg/dL   Creatinine, Ser 1.39 (H) 0.61 - 1.24 mg/dL   Calcium 8.8 (L) 8.9 - 10.3 mg/dL    Total Protein 6.5 6.5 - 8.1 g/dL   Albumin 3.1 (L) 3.5 - 5.0 g/dL   AST 137 (H) 15 - 41 U/L   ALT 72 (H) 0 - 44 U/L   Alkaline Phosphatase 55 38 - 126 U/L   Total Bilirubin 2.1 (H) 0.3 - 1.2 mg/dL   GFR calc non Af Amer 57 (L) >60 mL/min   GFR  calc Af Amer >60 >60 mL/min   Anion gap 16 (H) 5 - 15    Comment: Performed at [facility] [address] [place], [place] [address]    ECG   N/A  Telemetry   Sinus rhythm - Personally Reviewed  Radiology    Dg Chest 2 View  Result Date: 10/29/2018 CLINICAL DATA:  Shortness of breath EXAM: CHEST - 2 VIEW COMPARISON:  July 23, 2018 FINDINGS: There is slight bibasilar atelectasis. There is no edema or consolidation. The heart size and pulmonary vascularity are normal. No adenopathy. 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 IMPRESSION: Slight bibasilar atelectasis.  No edema or consolidation. Electronically Signed   By: Lowella Grip III M.D.   On: 10/29/2018 11:07   Ct Angio Chest Pe W/cm &/or Wo Cm  Result Date: 10/29/2018 CLINICAL DATA:  Chest pain, shortness of breath, tachycardia, no abdominal pain EXAM: CT ANGIOGRAPHY CHEST CT ABDOMEN AND PELVIS WITH CONTRAST TECHNIQUE: Multidetector CT imaging of the chest was performed using the standard protocol during bolus administration of intravenous contrast. Multiplanar CT image reconstructions and MIPs were obtained to evaluate the vascular anatomy. Multidetector CT imaging of the abdomen and pelvis was performed using the standard protocol during bolus administration of intravenous contrast. CONTRAST:  177mL OMNIPAQUE IOHEXOL 350 MG/ML SOLN COMPARISON:  None. FINDINGS: CTA CHEST FINDINGS Cardiovascular: Satisfactory opacification of the pulmonary arteries to the segmental level. No evidence of pulmonary embolism. Normal heart size. No pericardial effusion. Thoracic aorta is normal in caliber. No thoracic aortic dissection. Mediastinum/Nodes: No enlarged mediastinal,  hilar, or axillary lymph nodes. Thyroid gland, trachea, and esophagus demonstrate no significant findings. Lungs/Pleura: Lungs are clear. No pleural effusion or pneumothorax. Musculoskeletal: No acute osseous abnormality. No aggressive osseous lesion. Review of the MIP images confirms the above findings. CT ABDOMEN and PELVIS FINDINGS Hepatobiliary: Diffuse low attenuation of the liver as can be seen with hepatic steatosis. No intrahepatic or extrahepatic biliary duct dilatation. No hepatic mass. High-density material within the gallbladder likely reflecting gallbladder sludge. Pancreas: Unremarkable. No pancreatic ductal dilatation or surrounding inflammatory changes. Spleen: Normal in size without focal abnormality. Adrenals/Urinary Tract: Normal adrenal glands. Two 15 mm hypodense, fluid attenuating left renal masses consistent with cysts. Kidneys are otherwise unremarkable. No urolithiasis or obstructive uropathy. Stomach/Bowel: Stomach is within normal limits. Appendix appears normal. No evidence of bowel wall thickening, distention, or inflammatory changes. Vascular/Lymphatic: Normal caliber abdominal aorta with mild atherosclerosis. No lymphadenopathy. Reproductive: Prostate is unremarkable. Other: Small fat containing umbilical hernia.  No ascites. Musculoskeletal: No acute osseous abnormality. No aggressive osseous lesion. Moderate facet arthropathy at L5-S1. Review of the MIP images confirms the above findings. IMPRESSION: 1. No evidence pulmonary embolus. 2. No acute abnormality of the chest, abdomen or pelvis. 3. Severe hepatic steatosis. 4.  Aortic Atherosclerosis (ICD10-I70.0). Electronically Signed   By: Kathreen Devoid   On: 10/29/2018 12:22   Ct Abdomen Pelvis W Contrast  Result Date: 10/29/2018 CLINICAL DATA:  Chest pain, shortness of breath, tachycardia, no abdominal pain EXAM: CT ANGIOGRAPHY CHEST CT ABDOMEN AND PELVIS WITH CONTRAST TECHNIQUE: Multidetector CT imaging of the chest was performed  using the standard protocol during bolus administration of intravenous contrast. Multiplanar CT image reconstructions and MIPs were obtained to evaluate the vascular anatomy. Multidetector CT imaging of the abdomen and pelvis was performed using the standard protocol during bolus administration of intravenous contrast. CONTRAST:  168mL OMNIPAQUE IOHEXOL 350 MG/ML SOLN COMPARISON:  None. FINDINGS: CTA CHEST FINDINGS Cardiovascular:  Satisfactory opacification of the pulmonary arteries to the segmental level. No evidence of pulmonary embolism. Normal heart size. No pericardial effusion. Thoracic aorta is normal in caliber. No thoracic aortic dissection. Mediastinum/Nodes: No enlarged mediastinal, hilar, or axillary lymph nodes. Thyroid gland, trachea, and esophagus demonstrate no significant findings. Lungs/Pleura: Lungs are clear. No pleural effusion or pneumothorax. Musculoskeletal: No acute osseous abnormality. No aggressive osseous lesion. Review of the MIP images confirms the above findings. CT ABDOMEN and PELVIS FINDINGS Hepatobiliary: Diffuse low attenuation of the liver as can be seen with hepatic steatosis. No intrahepatic or extrahepatic biliary duct dilatation. No hepatic mass. High-density material within the gallbladder likely reflecting gallbladder sludge. Pancreas: Unremarkable. No pancreatic ductal dilatation or surrounding inflammatory changes. Spleen: Normal in size without focal abnormality. Adrenals/Urinary Tract: Normal adrenal glands. Two 15 mm hypodense, fluid attenuating left renal masses consistent with cysts. Kidneys are otherwise unremarkable. No urolithiasis or obstructive uropathy. Stomach/Bowel: Stomach is within normal limits. Appendix appears normal. No evidence of bowel wall thickening, distention, or inflammatory changes. Vascular/Lymphatic: Normal caliber abdominal aorta with mild atherosclerosis. No lymphadenopathy. Reproductive: Prostate is unremarkable. Other: Small fat containing  umbilical hernia.  No ascites. Musculoskeletal: No acute osseous abnormality. No aggressive osseous lesion. Moderate facet arthropathy at L5-S1. Review of the MIP images confirms the above findings. IMPRESSION: 1. No evidence pulmonary embolus. 2. No acute abnormality of the chest, abdomen or pelvis. 3. Severe hepatic steatosis. 4.  Aortic Atherosclerosis (ICD10-I70.0). Electronically Signed   By: Kathreen Devoid   On: 10/29/2018 12:22   Mr Abdomen Mrcp Wo Contrast  Result Date: 10/30/2018 CLINICAL DATA:  Inpatient. Chest pain. Dyspnea. Nausea and vomiting. Hepatic steatosis. Elevated liver function tests. EXAM: MRI ABDOMEN WITHOUT CONTRAST  (INCLUDING MRCP) TECHNIQUE: Multiplanar multisequence MR imaging of the abdomen was performed. Heavily T2-weighted images of the biliary and pancreatic ducts were obtained, and three-dimensional MRCP images were rendered by post processing. COMPARISON:  10/29/2018 CT abdomen/pelvis and abdominal sonogram. 10/09/2016 MRI abdomen. FINDINGS: Lower chest: No acute abnormality at the lung bases. Hepatobiliary: Normal liver size and configuration. Severe diffuse hepatic steatosis. No liver mass. Normal gallbladder with no cholelithiasis. No significant biliary ductal dilatation. Common bile duct diameter 6 mm. No evidence of choledocholithiasis. No biliary strictures, beading or masses. Pancreas: No pancreatic mass or duct dilation.  No pancreas divisum. Spleen: Normal size. No mass.  Splenic hemosiderosis. Adrenals/Urinary Tract: Normal adrenals. No hydronephrosis. No right renal masses. Simple appearing 2.6 cm anterior upper left renal cyst. Indeterminate 2.7 cm interpolar left renal cortical lesion (series 4/image 30) with T1 hyperintensity and heterogeneous T2 hypointensity. Stomach/Bowel: Normal non-distended stomach. Visualized small and large bowel is normal caliber, with no bowel wall thickening. Vascular/Lymphatic: Normal caliber abdominal aorta. No pathologically enlarged  lymph nodes in the abdomen. Other: No abdominal ascites or focal fluid collection. Musculoskeletal: No aggressive appearing focal osseous lesions. IMPRESSION: 1. Severe diffuse hepatic steatosis. No morphologic changes of hepatic cirrhosis. No liver masses. 2. No cholelithiasis. Bile ducts are within normal limits. CBD diameter 6 mm. No evidence of choledocholithiasis. 3. Indeterminate hemorrhagic/proteinaceous 2.7 cm interpolar left renal cortical lesion, incompletely characterized on this noncontrast MRI study. Suggest attention on follow-up outpatient MRI abdomen without and with IV contrast in 3 months. 4. Splenic hemosiderosis. Electronically Signed   By: Ilona Sorrel M.D.   On: 10/30/2018 14:20   Mr 3d Recon At Scanner  Result Date: 10/30/2018 CLINICAL DATA:  Inpatient. Chest pain. Dyspnea. Nausea and vomiting. Hepatic steatosis. Elevated liver function tests. EXAM: MRI ABDOMEN WITHOUT  CONTRAST  (INCLUDING MRCP) TECHNIQUE: Multiplanar multisequence MR imaging of the abdomen was performed. Heavily T2-weighted images of the biliary and pancreatic ducts were obtained, and three-dimensional MRCP images were rendered by post processing. COMPARISON:  10/29/2018 CT abdomen/pelvis and abdominal sonogram. 10/09/2016 MRI abdomen. FINDINGS: Lower chest: No acute abnormality at the lung bases. Hepatobiliary: Normal liver size and configuration. Severe diffuse hepatic steatosis. No liver mass. Normal gallbladder with no cholelithiasis. No significant biliary ductal dilatation. Common bile duct diameter 6 mm. No evidence of choledocholithiasis. No biliary strictures, beading or masses. Pancreas: No pancreatic mass or duct dilation.  No pancreas divisum. Spleen: Normal size. No mass.  Splenic hemosiderosis. Adrenals/Urinary Tract: Normal adrenals. No hydronephrosis. No right renal masses. Simple appearing 2.6 cm anterior upper left renal cyst. Indeterminate 2.7 cm interpolar left renal cortical lesion (series 4/image 30)  with T1 hyperintensity and heterogeneous T2 hypointensity. Stomach/Bowel: Normal non-distended stomach. Visualized small and large bowel is normal caliber, with no bowel wall thickening. Vascular/Lymphatic: Normal caliber abdominal aorta. No pathologically enlarged lymph nodes in the abdomen. Other: No abdominal ascites or focal fluid collection. Musculoskeletal: No aggressive appearing focal osseous lesions. IMPRESSION: 1. Severe diffuse hepatic steatosis. No morphologic changes of hepatic cirrhosis. No liver masses. 2. No cholelithiasis. Bile ducts are within normal limits. CBD diameter 6 mm. No evidence of choledocholithiasis. 3. Indeterminate hemorrhagic/proteinaceous 2.7 cm interpolar left renal cortical lesion, incompletely characterized on this noncontrast MRI study. Suggest attention on follow-up outpatient MRI abdomen without and with IV contrast in 3 months. 4. Splenic hemosiderosis. Electronically Signed   By: Ilona Sorrel M.D.   On: 10/30/2018 14:20   US Abdomen Limited  Result Date: 10/29/2018 CLINICAL DATA:  Vomiting.  Elevated liver function tests. EXAM: ULTRASOUND ABDOMEN LIMITED RIGHT UPPER QUADRANT COMPARISON:  None. FINDINGS: Gallbladder: No gallstones or wall thickening visualized. No sonographic Murphy sign noted by sonographer. Small amount of sludge in the gallbladder noted. Common bile duct: Diameter: 0.5 cm Liver: Echogenicity is increased consistent with fatty infiltration. No focal lesion. Portal vein is patent on color Doppler imaging with normal direction of blood flow towards the liver. IMPRESSION: Negative for gallstones or acute cholecystitis. Small volume of sludge in the gallbladder noted. Fatty infiltration of the liver. Electronically Signed   By: Inge Rise M.D.   On: 10/29/2018 14:48    Cardiac Studies   N/A  Assessment   1. Active Problems: 2.   Trigeminal neuralgia 3.   Essential hypertension 4.   Hypothyroidism 5.   Intractable nausea and vomiting 6.    Chest pain 7.   Chronic heart failure with preserved ejection fraction (HFpEF) ([place]) 8.   History of coronary artery disease 9.   Dehydration 10.   Nausea & vomiting 11.   Elevated LFTs 12.   Plan   1. Suspect non-cardiac chest pain. Dyspnea and tachycardia with exertion may be metabolic. Surgery has evaluated today. One issue may be antiplatelet therapy with surgery if he needs it - since he had PCI in 05/2018, could theoretically bridge with IV antiplatelet, but ideally would not want to stop DAPT for surgery unless really necessary.  Time Spent Directly with Patient:  I have spent a total of 25 minutes with the patient reviewing hospital notes, telemetry, EKGs, labs and examining the patient as well as establishing an assessment and plan that was discussed personally with the patient.  > 50% of time was spent in direct patient care.  Length of Stay:  LOS: 0 days   Nadean Corwin.  Debara Pickett, MD, [facility], [place] Director of the [facility]  [facility] Diplomate of the American Board of Clinical Lipidology Attending Cardiologist  Direct Dial: [phone]   Fax: [phone]  Website:  [email or web address] 10/31/2018, 9:21 AM

## 2018-10-31 NOTE — Progress Notes (Signed)
PROGRESS NOTE    [name]  OZH:[id number] DOB: [birth year] DOA: 10/29/2018 PCP: [name], DO   Brief Narrative:  On 10/29/2018 by Dr. Gean Birchwood [name] is a 58 y.o. male with history of CAD status post ending in February 2020, hypertension, chronic disease stage III, drug abuse, trigeminal neuralgia, hyperlipidemia hypothyroidism presents to the ER at [facility] with complaints of persistent chest pain with nausea vomiting over the last 3 days.  Patient had a similar picture and presentation in April of this year when cardiac cath was done and was unremarkable.  Patient stated chest pain is retrosternal stabbing in nature nonradiating present even at rest increased on exertion.  No associated shortness of breath.  Nausea vomiting is persistent has no relation to food and no blood in the vomitus.  Had noted some blood in the urine. Assessment & Plan   Chest pain -Atypical, has been ongoing since Saturday, 10/26/2018 -Has had intermittent nausea and vomiting -Troponin currently cycled, 20, 29-appears flat -Echocardiogram 06/10/2018: EF of 60 to 65%.  LV diastolic Doppler parameters consistent with impaired relaxation. (Grade 1 diastolic dysfunction) -Patient did have heart catheterization 07/24/2018 which showed short, widely patent left main.  Large LAD, irregularities up to 30% proximal.  Widely patent circumflex with mid vessel 20 to 30% eccentric narrowing.  EF 50%, low normal LV systolic function -CTA shows no evidence of pulmonary embolus -Cardiology consulted and appreciated- recommended GI workup. Do not feel that patient's symptoms are cardiac. -continues to complain of dyspnea with ambulation  Nausea and vomiting -possibly secondary to the above vs GI source -Continue antiemetics PRN, IVF -continues to have nausea- states he had one episode of vomiting yesterday -nausea usually starts about 45 minutes after liquid intake  Elevated  FLTs/Bilirubin -Hepatitis panel unremarkable -INR 1.2 -Abdominal ultrasound: negative for gallstones or acute cholecystitis.  Small volume of sludge in the gallbladder.  Fatty infiltration of liver. -CT abdomen pelvis showed no acute abnormality of the chest, abdomen, pelvis.  Severe hepatic steatosis.  No intra-or extrahepatic biliary duct dilatation. -AST up to 227, ALT 125 on admission, Bilirubin up to 3.7 -- all are trending downward -Possibly due to dehydration from nausea and vomiting -MRCP: Severe diffuse hepatic steatosis.  No morphological changes of hepatic cirrhosis.  No liver masses.  No cholelithiasis.  Bile ducts are within normal limits.  CBD diameter 6 mm.  No evidence of choledocholithiasis. -Continue to monitor CMP and give IV fluids -Having continued nausea as above, discussed with general surgery.  Recommended HIDA scan.  Hematuria -Unknown cause -Hemoglobin currently stable, 13.4 -UA unremarkable for infection -CT abdomen pelvis with contrast showed 215 mm hypodense fluid attenuating left renal masses consistent with cysts.  Kidneys are otherwise unremarkable.  No urolithiasis or obstructive uropathy. -Continue to monitor  Thrombocytopenia -Chronic, however worsening -Platelets currently 61, however appears that baseline has been low 100s -Continue to monitor  -no evidence of bleeding   Acute kidney injury vs CKD, stage II -Creatinine peaked to 1.76. Down to 1.39 today -Baseline creatinine has ranged from approximately 1.1 up to 1.4 -Continue IV fluids and monitor BMP  Essential hypertension -Continue amlodipine, metoprolol -Hold Cozaar  Renal lesion -Noted on MRCP: Indeterminate hemorrhagic/proteinaceous 2.7 cm interpolar left renal cortical lesion.  Suggest outpatient MRI of the abdomen with and without contrast in 3 months.  DVT Prophylaxis  SCDs  Code Status: Full  Family Communication: None at bedside  Disposition Plan: Observation.  Patient will  likely need another  night in the hospital given his continued nausea.  Also pending HIDA scan.  Suspect home on discharge.    Consultants Cardiology  Procedures  Abdominal US MRCP  Antibiotics   Anti-infectives (From admission, onward)   None      Subjective:   [name] seen and examined today.  Denies current abdominal pain.  States he had one episode of vomiting yesterday.  Continues to have nausea approximately 45 minutes after liquid intake.  Denies current diarrhea or constipation, dizziness or headache.  Denies chest pain.  Complains of shortness of breath with minimal exertion especially after taking approximately 5 steps.  Objective:   Vitals:   10/30/18 1930 10/30/18 2324 10/31/18 0314 10/31/18 0736  BP: 119/74 107/76 107/83   Pulse: 75   62  Resp: 16 16    Temp: 97.6 F (36.4 C) 98 F (36.7 C) 98.4 F (36.9 C) 97.6 F (36.4 C)  TempSrc: Oral Oral Oral Oral  SpO2: 96% 98% 96% 96%  Weight:      Height:        Intake/Output Summary (Last 24 hours) at 10/31/2018 0934 Last data filed at 10/30/2018 2300 Gross per 24 hour  Intake 1769.56 ml  Output --  Net 1769.56 ml   Filed Weights   10/29/18 0954 10/29/18 2100  Weight: 105.2 kg 102.7 kg   Exam  General: Well developed, well nourished, NAD, appears stated age  37: NCAT, mucous membranes moist.   Neck: Supple  Cardiovascular: S1 S2 auscultated, no murmur, RRR  Respiratory: Clear to auscultation bilaterally with equal chest rise  Abdomen: Soft, obese, nontender, nondistended, + bowel sounds  Extremities: warm dry without cyanosis clubbing or edema  Neuro: AAOx3, nonfocal  Psych: Appropriate mood and affect  Data Reviewed: I have personally reviewed following labs and imaging studies  CBC: Recent Labs  Lab 10/29/18 1003 10/30/18 0037 10/30/18 0557 10/30/18 0838 10/31/18 0303  WBC 6.7 6.1 6.0 6.7 5.6  NEUTROABS 4.7 4.1  --   --   --   HGB 16.4 13.8 14.0 14.7 13.4  HCT 46.9 38.6*  39.4 40.4 38.1*  MCV 99.2 97.2 97.8 95.3 99.2  PLT 85* 67* 62* 55* 61*   Basic Metabolic Panel: Recent Labs  Lab 10/29/18 1003 10/29/18 1520 10/30/18 0037 10/31/18 0303  NA 132* 132* 131* 132*  K 4.1 4.0 3.3* 3.6  CL 90* 96* 97* 99  CO2 11* 13* 15* 17*  GLUCOSE 116* 115* 135* 111*  BUN 23* 27* 23* 19  CREATININE 1.44* 1.72* 1.76* 1.39*  CALCIUM 10.3 8.8* 9.2 8.8*   GFR: Estimated Creatinine Clearance: 72.1 mL/min (A) (by C-G formula based on SCr of 1.39 mg/dL (H)). Liver Function Tests: Recent Labs  Lab 10/29/18 1003 10/30/18 0037 10/31/18 0303  AST 227* 177* 137*  ALT 125* 92* 72*  ALKPHOS 70 53 55  BILITOT 3.7* 3.2* 2.1*  PROT 9.6* 7.3 6.5  ALBUMIN 5.0 3.6 3.1*   No results for input(s): LIPASE, AMYLASE in the last 168 hours. No results for input(s): AMMONIA in the last 168 hours. Coagulation Profile: Recent Labs  Lab 10/29/18 1550  INR 1.2   Cardiac Enzymes: Recent Labs  Lab 10/29/18 1642  CKTOTAL 241   BNP (last 3 results) No results for input(s): PROBNP in the last 8760 hours. HbA1C: No results for input(s): HGBA1C in the last 72 hours. CBG: No results for input(s): GLUCAP in the last 168 hours. Lipid Profile: No results for input(s): CHOL, HDL, LDLCALC, TRIG, CHOLHDL, LDLDIRECT  in the last 72 hours. Thyroid Function Tests: No results for input(s): TSH, T4TOTAL, FREET4, T3FREE, THYROIDAB in the last 72 hours. Anemia Panel: No results for input(s): VITAMINB12, FOLATE, FERRITIN, TIBC, IRON, RETICCTPCT in the last 72 hours. Urine analysis:    Component Value Date/Time   COLORURINE BROWN (A) 10/29/2018 1707   APPEARANCEUR HAZY (A) 10/29/2018 1707   LABSPEC 1.015 10/29/2018 1707   PHURINE 6.0 10/29/2018 1707   GLUCOSEU NEGATIVE 10/29/2018 1707   GLUCOSEU NEGATIVE 05/27/2018 1421   HGBUR MODERATE (A) 10/29/2018 1707   BILIRUBINUR MODERATE (A) 10/29/2018 1707   BILIRUBINUR negative 02/23/2017 1539   KETONESUR 40 (A) 10/29/2018 1707   PROTEINUR  100 (A) 10/29/2018 1707   UROBILINOGEN 0.2 05/27/2018 1421   NITRITE NEGATIVE 10/29/2018 1707   LEUKOCYTESUR NEGATIVE 10/29/2018 1707   Sepsis Labs: @LABRCNTIP (procalcitonin:4,lacticidven:4)  ) Recent Results (from the past 240 hour(s))  SARS Coronavirus 2 (Hosp order,Performed in [place] lab via [facility] ID)     Status: None   Collection Time: 10/29/18 11:36 AM   Specimen: Dry Nasal Swab ([facility] ID Now)  Result Value Ref Range Status   SARS Coronavirus 2 ([facility] Now) NEGATIVE NEGATIVE Final    Comment: (NOTE) SARS-CoV-2 target nucleic acids are NOT DETECTED. The SARS-CoV-2 RNA is generally detectable in upper and lower respiratory specimens during the acute phase of infection.  Negativeresults do not preclude SARS-CoV-2 infection, do not rule out coinfections with other pathogens, and should not be used as the  sole basis for treatment or other patient management decisions.  Negative results must be combined with clinical observations, patient history, and epidemiological information. The expected result is Negative. Fact Sheet for Patients: GolfingFamily.no Fact Sheet for Healthcare Providers: https://www.hernandez-brewer.com/ This test is not yet approved or cleared by the [place] FDA and  has been authorized for detection and/or diagnosis of SARS-CoV-2 by FDA under an Emergency Use Authorization (EUA).  This EUA will remain in effect (meaning this test can be used) for the duration of  the COVID19 declaration under Section 5 64(b)(1) of the Act, 21 U.S.C.  section [phone] 3(b)(1), unless the authorization is terminated or revoked sooner. Performed at [facility], [address], [place], [place] [address]   Urine culture     Status: None   Collection Time: 10/29/18  5:07 PM   Specimen: Urine, Random  Result Value Ref Range Status   Specimen Description   Final    URINE, RANDOM Performed at [facility],  [address] [place], [place] [address]    Special Requests   Final    NONE Performed at [facility], [address] [place], [place] [address]    Culture   Final    NO GROWTH Performed at [facility], [place] [address], [place], [place] [address]    Report Status 10/30/2018 FINAL  Final  MRSA PCR Screening     Status: None   Collection Time: 10/29/18  8:07 PM   Specimen: Nasal Mucosa; Nasopharyngeal  Result Value Ref Range Status   MRSA by PCR NEGATIVE NEGATIVE Final    Comment:        The GeneXpert MRSA Assay (FDA approved for NASAL specimens only), is one component of a comprehensive MRSA colonization surveillance program. It is not intended to diagnose MRSA infection nor to guide or monitor treatment for MRSA infections. Performed at [facility], [place] [address], [place], [place] [address]  Radiology Studies: Dg Chest 2 View  Result Date: 10/29/2018 CLINICAL DATA:  Shortness of breath EXAM: CHEST - 2 VIEW COMPARISON:  July 23, 2018 FINDINGS: There is slight bibasilar atelectasis. There is no edema or consolidation. The heart size and pulmonary vascularity are normal. No adenopathy. 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 IMPRESSION: Slight bibasilar atelectasis.  No edema or consolidation. Electronically Signed   By: Lowella Grip III M.D.   On: 10/29/2018 11:07   Ct Angio Chest Pe W/cm &/or Wo Cm  Result Date: 10/29/2018 CLINICAL DATA:  Chest pain, shortness of breath, tachycardia, no abdominal pain EXAM: CT ANGIOGRAPHY CHEST CT ABDOMEN AND PELVIS WITH CONTRAST TECHNIQUE: Multidetector CT imaging of the chest was performed using the standard protocol during bolus administration of intravenous contrast. Multiplanar CT image reconstructions and MIPs were obtained to evaluate the vascular anatomy. Multidetector CT imaging of the abdomen and pelvis was performed using the standard protocol during bolus administration  of intravenous contrast. CONTRAST:  151mL OMNIPAQUE IOHEXOL 350 MG/ML SOLN COMPARISON:  None. FINDINGS: CTA CHEST FINDINGS Cardiovascular: Satisfactory opacification of the pulmonary arteries to the segmental level. No evidence of pulmonary embolism. Normal heart size. No pericardial effusion. Thoracic aorta is normal in caliber. No thoracic aortic dissection. Mediastinum/Nodes: No enlarged mediastinal, hilar, or axillary lymph nodes. Thyroid gland, trachea, and esophagus demonstrate no significant findings. Lungs/Pleura: Lungs are clear. No pleural effusion or pneumothorax. Musculoskeletal: No acute osseous abnormality. No aggressive osseous lesion. Review of the MIP images confirms the above findings. CT ABDOMEN and PELVIS FINDINGS Hepatobiliary: Diffuse low attenuation of the liver as can be seen with hepatic steatosis. No intrahepatic or extrahepatic biliary duct dilatation. No hepatic mass. High-density material within the gallbladder likely reflecting gallbladder sludge. Pancreas: Unremarkable. No pancreatic ductal dilatation or surrounding inflammatory changes. Spleen: Normal in size without focal abnormality. Adrenals/Urinary Tract: Normal adrenal glands. Two 15 mm hypodense, fluid attenuating left renal masses consistent with cysts. Kidneys are otherwise unremarkable. No urolithiasis or obstructive uropathy. Stomach/Bowel: Stomach is within normal limits. Appendix appears normal. No evidence of bowel wall thickening, distention, or inflammatory changes. Vascular/Lymphatic: Normal caliber abdominal aorta with mild atherosclerosis. No lymphadenopathy. Reproductive: Prostate is unremarkable. Other: Small fat containing umbilical hernia.  No ascites. Musculoskeletal: No acute osseous abnormality. No aggressive osseous lesion. Moderate facet arthropathy at L5-S1. Review of the MIP images confirms the above findings. IMPRESSION: 1. No evidence pulmonary embolus. 2. No acute abnormality of the chest, abdomen or  pelvis. 3. Severe hepatic steatosis. 4.  Aortic Atherosclerosis (ICD10-I70.0). Electronically Signed   By: Kathreen Devoid   On: 10/29/2018 12:22   Ct Abdomen Pelvis W Contrast  Result Date: 10/29/2018 CLINICAL DATA:  Chest pain, shortness of breath, tachycardia, no abdominal pain EXAM: CT ANGIOGRAPHY CHEST CT ABDOMEN AND PELVIS WITH CONTRAST TECHNIQUE: Multidetector CT imaging of the chest was performed using the standard protocol during bolus administration of intravenous contrast. Multiplanar CT image reconstructions and MIPs were obtained to evaluate the vascular anatomy. Multidetector CT imaging of the abdomen and pelvis was performed using the standard protocol during bolus administration of intravenous contrast. CONTRAST:  165mL OMNIPAQUE IOHEXOL 350 MG/ML SOLN COMPARISON:  None. FINDINGS: CTA CHEST FINDINGS Cardiovascular: Satisfactory opacification of the pulmonary arteries to the segmental level. No evidence of pulmonary embolism. Normal heart size. No pericardial effusion. Thoracic aorta is normal in caliber. No thoracic aortic dissection. Mediastinum/Nodes: No enlarged mediastinal, hilar, or axillary lymph nodes. Thyroid gland, trachea, and esophagus demonstrate no significant findings. Lungs/Pleura: Lungs  are clear. No pleural effusion or pneumothorax. Musculoskeletal: No acute osseous abnormality. No aggressive osseous lesion. Review of the MIP images confirms the above findings. CT ABDOMEN and PELVIS FINDINGS Hepatobiliary: Diffuse low attenuation of the liver as can be seen with hepatic steatosis. No intrahepatic or extrahepatic biliary duct dilatation. No hepatic mass. High-density material within the gallbladder likely reflecting gallbladder sludge. Pancreas: Unremarkable. No pancreatic ductal dilatation or surrounding inflammatory changes. Spleen: Normal in size without focal abnormality. Adrenals/Urinary Tract: Normal adrenal glands. Two 15 mm hypodense, fluid attenuating left renal masses  consistent with cysts. Kidneys are otherwise unremarkable. No urolithiasis or obstructive uropathy. Stomach/Bowel: Stomach is within normal limits. Appendix appears normal. No evidence of bowel wall thickening, distention, or inflammatory changes. Vascular/Lymphatic: Normal caliber abdominal aorta with mild atherosclerosis. No lymphadenopathy. Reproductive: Prostate is unremarkable. Other: Small fat containing umbilical hernia.  No ascites. Musculoskeletal: No acute osseous abnormality. No aggressive osseous lesion. Moderate facet arthropathy at L5-S1. Review of the MIP images confirms the above findings. IMPRESSION: 1. No evidence pulmonary embolus. 2. No acute abnormality of the chest, abdomen or pelvis. 3. Severe hepatic steatosis. 4.  Aortic Atherosclerosis (ICD10-I70.0). Electronically Signed   By: Kathreen Devoid   On: 10/29/2018 12:22   Mr Abdomen Mrcp Wo Contrast  Result Date: 10/30/2018 CLINICAL DATA:  Inpatient. Chest pain. Dyspnea. Nausea and vomiting. Hepatic steatosis. Elevated liver function tests. EXAM: MRI ABDOMEN WITHOUT CONTRAST  (INCLUDING MRCP) TECHNIQUE: Multiplanar multisequence MR imaging of the abdomen was performed. Heavily T2-weighted images of the biliary and pancreatic ducts were obtained, and three-dimensional MRCP images were rendered by post processing. COMPARISON:  10/29/2018 CT abdomen/pelvis and abdominal sonogram. 10/09/2016 MRI abdomen. FINDINGS: Lower chest: No acute abnormality at the lung bases. Hepatobiliary: Normal liver size and configuration. Severe diffuse hepatic steatosis. No liver mass. Normal gallbladder with no cholelithiasis. No significant biliary ductal dilatation. Common bile duct diameter 6 mm. No evidence of choledocholithiasis. No biliary strictures, beading or masses. Pancreas: No pancreatic mass or duct dilation.  No pancreas divisum. Spleen: Normal size. No mass.  Splenic hemosiderosis. Adrenals/Urinary Tract: Normal adrenals. No hydronephrosis. No right  renal masses. Simple appearing 2.6 cm anterior upper left renal cyst. Indeterminate 2.7 cm interpolar left renal cortical lesion (series 4/image 30) with T1 hyperintensity and heterogeneous T2 hypointensity. Stomach/Bowel: Normal non-distended stomach. Visualized small and large bowel is normal caliber, with no bowel wall thickening. Vascular/Lymphatic: Normal caliber abdominal aorta. No pathologically enlarged lymph nodes in the abdomen. Other: No abdominal ascites or focal fluid collection. Musculoskeletal: No aggressive appearing focal osseous lesions. IMPRESSION: 1. Severe diffuse hepatic steatosis. No morphologic changes of hepatic cirrhosis. No liver masses. 2. No cholelithiasis. Bile ducts are within normal limits. CBD diameter 6 mm. No evidence of choledocholithiasis. 3. Indeterminate hemorrhagic/proteinaceous 2.7 cm interpolar left renal cortical lesion, incompletely characterized on this noncontrast MRI study. Suggest attention on follow-up outpatient MRI abdomen without and with IV contrast in 3 months. 4. Splenic hemosiderosis. Electronically Signed   By: Ilona Sorrel M.D.   On: 10/30/2018 14:20   Mr 3d Recon At Scanner  Result Date: 10/30/2018 CLINICAL DATA:  Inpatient. Chest pain. Dyspnea. Nausea and vomiting. Hepatic steatosis. Elevated liver function tests. EXAM: MRI ABDOMEN WITHOUT CONTRAST  (INCLUDING MRCP) TECHNIQUE: Multiplanar multisequence MR imaging of the abdomen was performed. Heavily T2-weighted images of the biliary and pancreatic ducts were obtained, and three-dimensional MRCP images were rendered by post processing. COMPARISON:  10/29/2018 CT abdomen/pelvis and abdominal sonogram. 10/09/2016 MRI abdomen. FINDINGS: Lower chest: No acute abnormality  at the lung bases. Hepatobiliary: Normal liver size and configuration. Severe diffuse hepatic steatosis. No liver mass. Normal gallbladder with no cholelithiasis. No significant biliary ductal dilatation. Common bile duct diameter 6 mm. No  evidence of choledocholithiasis. No biliary strictures, beading or masses. Pancreas: No pancreatic mass or duct dilation.  No pancreas divisum. Spleen: Normal size. No mass.  Splenic hemosiderosis. Adrenals/Urinary Tract: Normal adrenals. No hydronephrosis. No right renal masses. Simple appearing 2.6 cm anterior upper left renal cyst. Indeterminate 2.7 cm interpolar left renal cortical lesion (series 4/image 30) with T1 hyperintensity and heterogeneous T2 hypointensity. Stomach/Bowel: Normal non-distended stomach. Visualized small and large bowel is normal caliber, with no bowel wall thickening. Vascular/Lymphatic: Normal caliber abdominal aorta. No pathologically enlarged lymph nodes in the abdomen. Other: No abdominal ascites or focal fluid collection. Musculoskeletal: No aggressive appearing focal osseous lesions. IMPRESSION: 1. Severe diffuse hepatic steatosis. No morphologic changes of hepatic cirrhosis. No liver masses. 2. No cholelithiasis. Bile ducts are within normal limits. CBD diameter 6 mm. No evidence of choledocholithiasis. 3. Indeterminate hemorrhagic/proteinaceous 2.7 cm interpolar left renal cortical lesion, incompletely characterized on this noncontrast MRI study. Suggest attention on follow-up outpatient MRI abdomen without and with IV contrast in 3 months. 4. Splenic hemosiderosis. Electronically Signed   By: Ilona Sorrel M.D.   On: 10/30/2018 14:20   US Abdomen Limited  Result Date: 10/29/2018 CLINICAL DATA:  Vomiting.  Elevated liver function tests. EXAM: ULTRASOUND ABDOMEN LIMITED RIGHT UPPER QUADRANT COMPARISON:  None. FINDINGS: Gallbladder: No gallstones or wall thickening visualized. No sonographic Murphy sign noted by sonographer. Small amount of sludge in the gallbladder noted. Common bile duct: Diameter: 0.5 cm Liver: Echogenicity is increased consistent with fatty infiltration. No focal lesion. Portal vein is patent on color Doppler imaging with normal direction of blood flow towards  the liver. IMPRESSION: Negative for gallstones or acute cholecystitis. Small volume of sludge in the gallbladder noted. Fatty infiltration of the liver. Electronically Signed   By: Inge Rise M.D.   On: 10/29/2018 14:48     Scheduled Meds:  amLODipine  10 mg Oral Daily   aspirin EC  81 mg Oral Daily   chlorhexidine  15 mL Mouth Rinse BID   clopidogrel  75 mg Oral Daily   ezetimibe  10 mg Oral Daily   FLUoxetine  20 mg Oral Daily   losartan  100 mg Oral Daily   mouth rinse  15 mL Mouth Rinse q12n4p   metoprolol succinate  50 mg Oral Daily   pantoprazole  20 mg Oral Daily   Continuous Infusions:    LOS: 0 days   Time Spent in minutes   45 minutes  Elonda Giuliano D.O. on 10/31/2018 at 9:34 AM  Between 7am to 7pm - Please see pager noted on [email or web address]  After 7pm go to [email or web address]  And look for the night coverage person covering for me after hours  [facility] Office  [phone]

## 2018-11-01 ENCOUNTER — Encounter: Payer: Self-pay | Admitting: *Deleted

## 2018-11-01 DIAGNOSIS — R945 Abnormal results of liver function studies: Secondary | ICD-10-CM | POA: Diagnosis not present

## 2018-11-01 DIAGNOSIS — I5032 Chronic diastolic (congestive) heart failure: Secondary | ICD-10-CM | POA: Diagnosis not present

## 2018-11-01 DIAGNOSIS — R0789 Other chest pain: Secondary | ICD-10-CM | POA: Diagnosis not present

## 2018-11-01 DIAGNOSIS — E860 Dehydration: Secondary | ICD-10-CM | POA: Diagnosis not present

## 2018-11-01 DIAGNOSIS — R112 Nausea with vomiting, unspecified: Secondary | ICD-10-CM | POA: Diagnosis not present

## 2018-11-01 LAB — CBC
HCT: 36.9 % — ABNORMAL LOW (ref 39.0–52.0)
Hemoglobin: 13.1 g/dL (ref 13.0–17.0)
MCH: 34.7 pg — ABNORMAL HIGH (ref 26.0–34.0)
MCHC: 35.5 g/dL (ref 30.0–36.0)
MCV: 97.6 fL (ref 80.0–100.0)
Platelets: 89 10*3/uL — ABNORMAL LOW (ref 150–400)
RBC: 3.78 MIL/uL — ABNORMAL LOW (ref 4.22–5.81)
RDW: 14.1 % (ref 11.5–15.5)
WBC: 6.1 10*3/uL (ref 4.0–10.5)
nRBC: 0 % (ref 0.0–0.2)

## 2018-11-01 LAB — COMPREHENSIVE METABOLIC PANEL
ALT: 66 U/L — ABNORMAL HIGH (ref 0–44)
AST: 102 U/L — ABNORMAL HIGH (ref 15–41)
Albumin: 3.1 g/dL — ABNORMAL LOW (ref 3.5–5.0)
Alkaline Phosphatase: 72 U/L (ref 38–126)
Anion gap: 12 (ref 5–15)
BUN: 15 mg/dL (ref 6–20)
CO2: 23 mmol/L (ref 22–32)
Calcium: 9.2 mg/dL (ref 8.9–10.3)
Chloride: 100 mmol/L (ref 98–111)
Creatinine, Ser: 1.33 mg/dL — ABNORMAL HIGH (ref 0.61–1.24)
GFR calc Af Amer: >60 mL/min (ref >60)
GFR calc non Af Amer: 60 mL/min — ABNORMAL LOW (ref >60)
Glucose, Bld: 145 mg/dL — ABNORMAL HIGH (ref 70–99)
Potassium: 2.9 mmol/L — ABNORMAL LOW (ref 3.5–5.1)
Sodium: 135 mmol/L (ref 135–145)
Total Bilirubin: 1.3 mg/dL — ABNORMAL HIGH (ref 0.3–1.2)
Total Protein: 6.5 g/dL (ref 6.5–8.1)

## 2018-11-01 LAB — POTASSIUM: Potassium: 3.7 mmol/L (ref 3.5–5.1)

## 2018-11-01 LAB — MAGNESIUM: Magnesium: 2.1 mg/dL (ref 1.7–2.4)

## 2018-11-01 MED ORDER — ONDANSETRON HCL 4 MG PO TABS: 4.0000 mg | ORAL_TABLET | Freq: Four times a day (QID) | ORAL | 0 refills | Status: DC | PRN

## 2018-11-01 MED ORDER — ALUM & MAG HYDROXIDE-SIMETH 200-200-20 MG/5ML PO SUSP
30.0000 mL | Freq: Once | ORAL | Status: AC
  Administered 2018-11-01: 06:00:00 30 mL via ORAL
  Filled 2018-11-01: qty 30

## 2018-11-01 MED ORDER — PROMETHAZINE HCL 12.5 MG PO TABS: 12.5000 mg | ORAL_TABLET | Freq: Four times a day (QID) | ORAL | 0 refills | Status: DC | PRN

## 2018-11-01 MED ORDER — OXYCODONE HCL 5 MG PO TABS
5.0000 mg | ORAL_TABLET | ORAL | Status: DC | PRN
  Administered 2018-11-01 (×3): 5 mg via ORAL
  Filled 2018-11-01 (×3): qty 1

## 2018-11-01 MED ORDER — POTASSIUM CHLORIDE CRYS ER 20 MEQ PO TBCR
40.0000 meq | EXTENDED_RELEASE_TABLET | ORAL | Status: AC
  Administered 2018-11-01 (×2): 40 meq via ORAL
  Filled 2018-11-01 (×2): qty 2

## 2018-11-01 MED ORDER — LIDOCAINE VISCOUS HCL 2 % MT SOLN
15.0000 mL | Freq: Once | OROMUCOSAL | Status: AC
  Administered 2018-11-01: 15 mL via ORAL
  Filled 2018-11-01: qty 15

## 2018-11-01 MED ORDER — FAMOTIDINE 20 MG PO TABS
20.0000 mg | ORAL_TABLET | Freq: Two times a day (BID) | ORAL | Status: DC
  Administered 2018-11-01: 20 mg via ORAL
  Filled 2018-11-01 (×2): qty 1

## 2018-11-01 NOTE — Progress Notes (Signed)
PROGRESS NOTE    [name]  VVO:[id number] DOB: 1961-03-05 DOA: 10/29/2018 PCP: [name], DO   Brief Narrative:  On 10/29/2018 by Dr. Gean Birchwood Gregory [name] is a 58 y.o. male with history of CAD status post ending in February 2020, hypertension, chronic disease stage III, drug abuse, trigeminal neuralgia, hyperlipidemia hypothyroidism presents to the ER at [facility] with complaints of persistent chest pain with nausea vomiting over the last 3 days.  Patient had a similar picture and presentation in April of this year when cardiac cath was done and was unremarkable.  Patient stated chest pain is retrosternal stabbing in nature nonradiating present even at rest increased on exertion.  No associated shortness of breath.  Nausea vomiting is persistent has no relation to food and no blood in the vomitus.  Had noted some blood in the urine.  Interim history Admitted for chest pain, however likely not cardiac in nature per cardiology. Patient with elevated LFTs, no abdominal pain, but persistent nausea. MRCP, CT, abd US unremarkable. HIDA ordered showing nonfunctioning gallbladder. General surgery and GI consulted.  Assessment & Plan   Chest pain -Atypical, has been ongoing since Saturday, 10/26/2018 -Has had intermittent nausea and vomiting -Troponin currently cycled, 20, 29-appears flat -Echocardiogram 06/10/2018: EF of 60 to 65%.  LV diastolic Doppler parameters consistent with impaired relaxation. (Grade 1 diastolic dysfunction) -Patient did have heart catheterization 07/24/2018 which showed short, widely patent left main.  Large LAD, irregularities up to 30% proximal.  Widely patent circumflex with mid vessel 20 to 30% eccentric narrowing.  EF 50%, low normal LV systolic function -CTA shows no evidence of pulmonary embolus -Cardiology consulted and appreciated- recommended GI workup. Do not feel that patient's symptoms are cardiac. -continues to complain of  dyspnea with ambulation  Nausea and vomiting -possibly secondary to the above vs GI source -Continue antiemetics PRN, IVF -continues to have nausea -nausea usually starts about 45 minutes after liquid intake -see discussion below  Elevated FLTs/Bilirubin -Hepatitis panel unremarkable -INR 1.2 -Abdominal ultrasound: negative for gallstones or acute cholecystitis.  Small volume of sludge in the gallbladder.  Fatty infiltration of liver. -CT abdomen pelvis showed no acute abnormality of the chest, abdomen, pelvis.  Severe hepatic steatosis.  No intra-or extrahepatic biliary duct dilatation. -AST up to 227, ALT 125 on admission, Bilirubin up to 3.7 -- all are trending downward -Possibly due to dehydration from nausea and vomiting -MRCP: Severe diffuse hepatic steatosis.  No morphological changes of hepatic cirrhosis.  No liver masses.  No cholelithiasis.  Bile ducts are within normal limits.  CBD diameter 6 mm.  No evidence of choledocholithiasis. -Continue to monitor CMP and give IV fluids -HIDA: Nonvisualization of the gallbladder, felt to be indicative of cystic duct obstruction indicative of acute cholecystitis. -General surgery and gastroenterology consulted and appreciated  Hematuria -Unknown cause -Hemoglobin currently stable, 13.1 -UA unremarkable for infection -CT abdomen pelvis with contrast showed 215 mm hypodense fluid attenuating left renal masses consistent with cysts.  Kidneys are otherwise unremarkable.  No urolithiasis or obstructive uropathy. -Continue to monitor  Thrombocytopenia -Chronic -Platelets currently 89, however appears that baseline has been low 100s -Continue to monitor  -no evidence of bleeding   Acute kidney injury vs CKD, stage II -Creatinine peaked to 1.76. Down to 1.33 today -Baseline creatinine has ranged from approximately 1.1 up to 1.4 -Continue IV fluids and monitor BMP  Essential hypertension -Continue amlodipine, metoprolol -Hold  Cozaar  Renal lesion -Noted on MRCP: Indeterminate hemorrhagic/proteinaceous 2.7  cm interpolar left renal cortical lesion.  Suggest outpatient MRI of the abdomen with and without contrast in 3 months.  GERD -currently on protonix -added famotidine  Hypokalemia -will replace and continue to monitor   -obtained magnesium 2.1  DVT Prophylaxis  SCDs  Code Status: Full  Family Communication: None at bedside  Disposition Plan: Mated.  Given results of HIDA scan, general surgery as well as gastroenterology consulted and appreciated.  Patient currently on DAPT making possible surgical intervention tricky as these medications would need to be held.  Consultants Cardiology General sugery Gastroenterology  Procedures  Abdominal US MRCP HIDA  Antibiotics   Anti-infectives (From admission, onward)   None      Subjective:   [name] seen and examined today.  Continues to have episodes of nausea and feels he may have had one episode of vomiting.  Denies current abdominal pain.  Feels some epigastric pain from time to time and feels that Protonix does not help.  Denies current chest pain or shortness of breath, dizziness or headache.  Objective:   Vitals:   11/01/18 0017 11/01/18 0401 11/01/18 0725 11/01/18 1128  BP: 114/84 102/76 113/85 108/79  Pulse: 60  66 69  Resp: 15 13 15 18   Temp: 97.9 F (36.6 C) 98.1 F (36.7 C) 98.1 F (36.7 C) 98 F (36.7 C)  TempSrc: Oral Oral Oral Oral  SpO2: 100%   96%  Weight:      Height:        Intake/Output Summary (Last 24 hours) at 11/01/2018 1131 Last data filed at 11/01/2018 0900 Gross per 24 hour  Intake 240 ml  Output --  Net 240 ml   Filed Weights   10/29/18 0954 10/29/18 2100  Weight: 105.2 kg 102.7 kg   Exam  General: Well developed, well nourished, NAD, appears stated age  71: NCAT,  mucous membranes moist.   Cardiovascular: S1 S2 auscultated, RRR, no murmur  Respiratory: Clear to auscultation  bilaterally with equal chest rise  Abdomen: Soft, obese, nontender, nondistended, + bowel sounds  Extremities: warm dry without cyanosis clubbing or edema  Neuro: AAOx3, nonfocal  Psych: Appropriate mood and affect  Data Reviewed: I have personally reviewed following labs and imaging studies  CBC: Recent Labs  Lab 10/29/18 1003 10/30/18 0037 10/30/18 0557 10/30/18 0838 10/31/18 0303 11/01/18 0219  WBC 6.7 6.1 6.0 6.7 5.6 6.1  NEUTROABS 4.7 4.1  --   --   --   --   HGB 16.4 13.8 14.0 14.7 13.4 13.1  HCT 46.9 38.6* 39.4 40.4 38.1* 36.9*  MCV 99.2 97.2 97.8 95.3 99.2 97.6  PLT 85* 67* 62* 55* 61* 89*   Basic Metabolic Panel: Recent Labs  Lab 10/29/18 1003 10/29/18 1520 10/30/18 0037 10/31/18 0303 11/01/18 0219  NA 132* 132* 131* 132* 135  K 4.1 4.0 3.3* 3.6 2.9*  CL 90* 96* 97* 99 100  CO2 11* 13* 15* 17* 23  GLUCOSE 116* 115* 135* 111* 145*  BUN 23* 27* 23* 19 15  CREATININE 1.44* 1.72* 1.76* 1.39* 1.33*  CALCIUM 10.3 8.8* 9.2 8.8* 9.2  MG  --   --   --   --  2.1   GFR: Estimated Creatinine Clearance: 75.4 mL/min (A) (by C-G formula based on SCr of 1.33 mg/dL (H)). Liver Function Tests: Recent Labs  Lab 10/29/18 1003 10/30/18 0037 10/31/18 0303 11/01/18 0219  AST 227* 177* 137* 102*  ALT 125* 92* 72* 66*  ALKPHOS 70 53 55 72  BILITOT 3.7* 3.2* 2.1* 1.3*  PROT 9.6* 7.3 6.5 6.5  ALBUMIN 5.0 3.6 3.1* 3.1*   No results for input(s): LIPASE, AMYLASE in the last 168 hours. No results for input(s): AMMONIA in the last 168 hours. Coagulation Profile: Recent Labs  Lab 10/29/18 1550  INR 1.2   Cardiac Enzymes: Recent Labs  Lab 10/29/18 1642  CKTOTAL 241   BNP (last 3 results) No results for input(s): PROBNP in the last 8760 hours. HbA1C: No results for input(s): HGBA1C in the last 72 hours. CBG: No results for input(s): GLUCAP in the last 168 hours. Lipid Profile: No results for input(s): CHOL, HDL, LDLCALC, TRIG, CHOLHDL, LDLDIRECT in the last  72 hours. Thyroid Function Tests: No results for input(s): TSH, T4TOTAL, FREET4, T3FREE, THYROIDAB in the last 72 hours. Anemia Panel: No results for input(s): VITAMINB12, FOLATE, FERRITIN, TIBC, IRON, RETICCTPCT in the last 72 hours. Urine analysis:    Component Value Date/Time   COLORURINE BROWN (A) 10/29/2018 1707   APPEARANCEUR HAZY (A) 10/29/2018 1707   LABSPEC 1.015 10/29/2018 1707   PHURINE 6.0 10/29/2018 1707   GLUCOSEU NEGATIVE 10/29/2018 1707   GLUCOSEU NEGATIVE 05/27/2018 1421   HGBUR MODERATE (A) 10/29/2018 1707   BILIRUBINUR MODERATE (A) 10/29/2018 1707   BILIRUBINUR negative 02/23/2017 1539   KETONESUR 40 (A) 10/29/2018 1707   PROTEINUR 100 (A) 10/29/2018 1707   UROBILINOGEN 0.2 05/27/2018 1421   NITRITE NEGATIVE 10/29/2018 1707   LEUKOCYTESUR NEGATIVE 10/29/2018 1707   Sepsis Labs: @LABRCNTIP (procalcitonin:4,lacticidven:4)  ) Recent Results (from the past 240 hour(s))  SARS Coronavirus 2 (Hosp order,Performed in [place] lab via [employer] ID)     Status: None   Collection Time: 10/29/18 11:36 AM   Specimen: Dry Nasal Swab ([employer] [facility] Now)  Result Value Ref Range Status   SARS Coronavirus 2 ([employer] Now) NEGATIVE NEGATIVE Final    Comment: (NOTE) SARS-CoV-2 target nucleic acids are NOT DETECTED. The SARS-CoV-2 RNA is generally detectable in upper and lower respiratory specimens during the acute phase of infection.  Negativeresults do not preclude SARS-CoV-2 infection, do not rule out coinfections with other pathogens, and should not be used as the  sole basis for treatment or other patient management decisions.  Negative results must be combined with clinical observations, patient history, and epidemiological information. The expected result is Negative. Fact Sheet for Patients: GolfingFamily.no Fact Sheet for Healthcare Providers: https://www.hernandez-brewer.com/ This test is not yet approved or cleared by the [place] FDA and  has been authorized for detection and/or diagnosis of SARS-CoV-2 by FDA under an Emergency Use Authorization (EUA).  This EUA will remain in effect (meaning this test can be used) for the duration of  the COVID19 declaration under Section 5 64(b)(1) of the Act, 21 U.S.C.  section [phone] 3(b)(1), unless the authorization is terminated or revoked sooner. Performed at [facility], [address], [place], [place] [address]   Urine culture     Status: None   Collection Time: 10/29/18  5:07 PM   Specimen: Urine, Random  Result Value Ref Range Status   Specimen Description   Final    URINE, RANDOM Performed at [facility], [address] [place], [place] [address]    Special Requests   Final    NONE Performed at [facility], [address] [place], [place] [address]    Culture   Final    NO GROWTH Performed at [facility], [place]  [address] [place], [place] [address]    Report Status 10/30/2018 FINAL  Final  MRSA PCR Screening     Status: None   Collection Time: 10/29/18  8:07 PM   Specimen: Nasal Mucosa; Nasopharyngeal  Result Value Ref Range Status   MRSA by PCR NEGATIVE NEGATIVE Final    Comment:        The GeneXpert MRSA Assay (FDA approved for NASAL specimens only), is one component of a comprehensive MRSA colonization surveillance program. It is not intended to diagnose MRSA infection nor to guide or monitor treatment for MRSA infections. Performed at [facility], [place] [address], [place], [place] [address]       Radiology Studies: Mr Abdomen Mrcp Wo Contrast  Result Date: 10/30/2018 CLINICAL DATA:  Inpatient. Chest pain. Dyspnea. Nausea and vomiting. Hepatic steatosis. Elevated liver function tests. EXAM: MRI ABDOMEN WITHOUT CONTRAST  (INCLUDING MRCP) TECHNIQUE: Multiplanar multisequence MR imaging of the abdomen was performed. Heavily T2-weighted images of the biliary and pancreatic ducts were  obtained, and three-dimensional MRCP images were rendered by post processing. COMPARISON:  10/29/2018 CT abdomen/pelvis and abdominal sonogram. 10/09/2016 MRI abdomen. FINDINGS: Lower chest: No acute abnormality at the lung bases. Hepatobiliary: Normal liver size and configuration. Severe diffuse hepatic steatosis. No liver mass. Normal gallbladder with no cholelithiasis. No significant biliary ductal dilatation. Common bile duct diameter 6 mm. No evidence of choledocholithiasis. No biliary strictures, beading or masses. Pancreas: No pancreatic mass or duct dilation.  No pancreas divisum. Spleen: Normal size. No mass.  Splenic hemosiderosis. Adrenals/Urinary Tract: Normal adrenals. No hydronephrosis. No right renal masses. Simple appearing 2.6 cm anterior upper left renal cyst. Indeterminate 2.7 cm interpolar left renal cortical lesion (series 4/image 30) with T1 hyperintensity and heterogeneous T2 hypointensity. Stomach/Bowel: Normal non-distended stomach. Visualized small and large bowel is normal caliber, with no bowel wall thickening. Vascular/Lymphatic: Normal caliber abdominal aorta. No pathologically enlarged lymph nodes in the abdomen. Other: No abdominal ascites or focal fluid collection. Musculoskeletal: No aggressive appearing focal osseous lesions. IMPRESSION: 1. Severe diffuse hepatic steatosis. No morphologic changes of hepatic cirrhosis. No liver masses. 2. No cholelithiasis. Bile ducts are within normal limits. CBD diameter 6 mm. No evidence of choledocholithiasis. 3. Indeterminate hemorrhagic/proteinaceous 2.7 cm interpolar left renal cortical lesion, incompletely characterized on this noncontrast MRI study. Suggest attention on follow-up outpatient MRI abdomen without and with IV contrast in 3 months. 4. Splenic hemosiderosis. Electronically Signed   By: Ilona Sorrel M.D.   On: 10/30/2018 14:20   Mr 3d Recon At Scanner  Result Date: 10/30/2018 CLINICAL DATA:  Inpatient. Chest pain. Dyspnea.  Nausea and vomiting. Hepatic steatosis. Elevated liver function tests. EXAM: MRI ABDOMEN WITHOUT CONTRAST  (INCLUDING MRCP) TECHNIQUE: Multiplanar multisequence MR imaging of the abdomen was performed. Heavily T2-weighted images of the biliary and pancreatic ducts were obtained, and three-dimensional MRCP images were rendered by post processing. COMPARISON:  10/29/2018 CT abdomen/pelvis and abdominal sonogram. 10/09/2016 MRI abdomen. FINDINGS: Lower chest: No acute abnormality at the lung bases. Hepatobiliary: Normal liver size and configuration. Severe diffuse hepatic steatosis. No liver mass. Normal gallbladder with no cholelithiasis. No significant biliary ductal dilatation. Common bile duct diameter 6 mm. No evidence of choledocholithiasis. No biliary strictures, beading or masses. Pancreas: No pancreatic mass or duct dilation.  No pancreas divisum. Spleen: Normal size. No mass.  Splenic hemosiderosis. Adrenals/Urinary Tract: Normal adrenals. No hydronephrosis. No right renal masses. Simple appearing 2.6 cm anterior upper left renal cyst. Indeterminate 2.7 cm interpolar left renal cortical lesion (series  4/image 30) with T1 hyperintensity and heterogeneous T2 hypointensity. Stomach/Bowel: Normal non-distended stomach. Visualized small and large bowel is normal caliber, with no bowel wall thickening. Vascular/Lymphatic: Normal caliber abdominal aorta. No pathologically enlarged lymph nodes in the abdomen. Other: No abdominal ascites or focal fluid collection. Musculoskeletal: No aggressive appearing focal osseous lesions. IMPRESSION: 1. Severe diffuse hepatic steatosis. No morphologic changes of hepatic cirrhosis. No liver masses. 2. No cholelithiasis. Bile ducts are within normal limits. CBD diameter 6 mm. No evidence of choledocholithiasis. 3. Indeterminate hemorrhagic/proteinaceous 2.7 cm interpolar left renal cortical lesion, incompletely characterized on this noncontrast MRI study. Suggest attention on  follow-up outpatient MRI abdomen without and with IV contrast in 3 months. 4. Splenic hemosiderosis. Electronically Signed   By: Ilona Sorrel M.D.   On: 10/30/2018 14:20   Nm Hepato W/eject Fract  Result Date: 11/01/2018 CLINICAL DATA:  Elevated liver enzymes. Abnormal gallbladder appearance on CT EXAM: NUCLEAR MEDICINE HEPATOBILIARY IMAGING TECHNIQUE: Sequential images of the abdomen were obtained out to 90 minutes following intravenous administration of radiopharmaceutical. Patient unable to receive opioids. Delayed 6 hour image therefore obtained. RADIOPHARMACEUTICALS:  5.2 mCi Tc-31m  Choletec IV COMPARISON:  None. FINDINGS: Liver uptake of radiotracer is unremarkable. There is prompt visualization of small bowel, indicating patency of the common bile duct. Gallbladder did not visualize during the course of this study. IMPRESSION: Nonvisualization of the gallbladder, a finding felt to be indicative of cystic duct obstruction. Cystic duct obstruction is indicative of acute cholecystitis. Common bile duct is patent as is evidenced by visualization of small bowel. Electronically Signed   By: Lowella Grip III M.D.   On: 11/01/2018 08:14     Scheduled Meds:  amLODipine  10 mg Oral Daily   aspirin EC  81 mg Oral Daily   chlorhexidine  15 mL Mouth Rinse BID   clopidogrel  75 mg Oral Daily   ezetimibe  10 mg Oral Daily   famotidine  20 mg Oral BID   FLUoxetine  20 mg Oral Daily   losartan  100 mg Oral Daily   mouth rinse  15 mL Mouth Rinse q12n4p   metoprolol succinate  50 mg Oral Daily   pantoprazole  20 mg Oral Daily   potassium chloride  40 mEq Oral Q4H   Continuous Infusions:    LOS: 1 day   Time Spent in minutes   45 minutes  Isamu Trammel D.O. on 11/01/2018 at 11:31 AM  Between 7am to 7pm - Please see pager noted on [email or web address]  After 7pm go to [email or web address]  And look for the night coverage person covering for me after hours  [facility] Office   [phone]

## 2018-11-01 NOTE — Progress Notes (Signed)
Dr. Barbaraann Rondo updated RN that surgery plans to follow as an outpatient.  A STAT potassium level is ordered to ensure patient's potassium is back within normal range; if so, patient will discharge this evening.  This has been explained to patient and he is in agreement.  Will await K+ level.

## 2018-11-01 NOTE — Plan of Care (Signed)

## 2018-11-01 NOTE — Discharge Instructions (Signed)
Nausea, Adult Nausea is feeling sick to your stomach or feeling that you are about to throw up (vomit). Feeling sick to your stomach is usually not serious, but it may be an early sign of a more serious medical problem. As you feel sicker to your stomach, you may throw up. If you throw up, or if you are not able to drink enough fluids, there is a risk that you may lose too much water in your body (get dehydrated). If you lose too much water in your body, you may:  Feel tired.  Feel thirsty.  Have a dry mouth.  Have cracked lips.  Go pee (urinate) less often. Older adults and people who have other diseases or a weak body defense system (immune system) have a higher risk of losing too much water in the body. The main goals of treating this condition are:  To relieve your nausea.  To ensure your nausea occurs less often.  To prevent throwing up and losing too much fluid. Follow these instructions at home: Watch your symptoms for any changes. Tell your doctor about them. Follow these instructions as told by your doctor. Eating and drinking      Take an ORS (oral rehydration solution). This is a drink that is sold at pharmacies and stores.  Drink clear fluids in small amounts as you are able. These include: ? Water. ? Ice chips. ? Fruit juice that has water added (diluted fruit juice). ? Low-calorie sports drinks.  Eat bland, easy-to-digest foods in small amounts as you are able, such as: ? Bananas. ? Applesauce. ? Rice. ? Low-fat (lean) meats. ? Toast. ? Crackers.  Avoid drinking fluids that have a lot of sugar or caffeine in them. This includes energy drinks, sports drinks, and soda.  Avoid alcohol.  Avoid spicy or fatty foods. General instructions  Take over-the-counter and prescription medicines only as told by your doctor.  Rest at home while you get better.  Drink enough fluid to keep your pee (urine) pale yellow.  Take slow and deep breaths when you feel  sick to your stomach.  Avoid food or things that have strong smells.  Wash your hands often with soap and water. If you cannot use soap and water, use hand sanitizer.  Make sure that all people in your home wash their hands well and often.  Keep all follow-up visits as told by your doctor. This is important. Contact a doctor if:  You feel sicker to your stomach.  You feel sick to your stomach for more than 2 days.  You throw up.  You are not able to drink fluids without throwing up.  You have new symptoms.  You have a fever.  You have a headache.  You have muscle cramps.  You have a rash.  You have pain while peeing.  You feel light-headed or dizzy. Get help right away if:  You have pain in your chest, neck, arm, or jaw.  You feel very weak or you pass out (faint).  You have throw up that is bright red or looks like coffee grounds.  You have bloody or black poop (stools) or poop that looks like tar.  You have a very bad headache, a stiff neck, or both.  You have very bad pain, cramping, or bloating in your belly (abdomen).  You have trouble breathing or you are breathing very quickly.  Your heart is beating very quickly.  Your skin feels cold and clammy.  You feel confused.  You have signs of losing too much water in your body, such as: ? Dark pee, very little pee, or no pee. ? Cracked lips. ? Dry mouth. ? Sunken eyes. ? Sleepiness. ? Weakness. These symptoms may be an emergency. Do not wait to see if the symptoms will go away. Get medical help right away. Call your local emergency services (911 in the [place] Do not drive yourself to the hospital. Summary  Nausea is feeling sick to your stomach or feeling that you are about to throw up (vomit).  If you throw up, or if you are not able to drink enough fluids, there is a risk that you may lose too much water in your body (get dehydrated).  Eat and drink what your doctor tells you. Take  over-the-counter and prescription medicines only as told by your doctor.  Contact a doctor right away if your symptoms get worse or you have new symptoms.  Keep all follow-up visits as told by your doctor. This is important. This information is not intended to replace advice given to you by your health care provider. Make sure you discuss any questions you have with your health care provider. Document Released: 03/30/2011 Document Revised: 09/18/2017 Document Reviewed: 09/18/2017 Elsevier Patient Education  2020 [employer].

## 2018-11-01 NOTE — Consult Note (Addendum)
Referring Provider:  Dr. Ree Kida, [facility] Primary Care Physician:  [name], DO Primary Gastroenterologist:  Dr. Fuller Plan  Reason for Consultation:  Elevated LFT's  HPI: [name] is a 58 y.o. male with extensive PMH as listed below.  Admitted to the hospital with complaints of chest pain, SOB on exertion, nausea, and vomiting that he says began on 10/26/2018.  Extensive cardiac evaluation proved unremarkable.  LFT's slightly elevated at AST 102, ALT 66, ALP 72, and total bili 1.3  Two days ago AST was 177, ALT 92, ALP 53, and total bili 3.2.  Looks like first elevation seen on 07/24/2018.  HIDA scan showed the following:  IMPRESSION: Nonvisualization of the gallbladder, a finding felt to be indicative of cystic duct obstruction. Cystic duct obstruction is indicative of acute cholecystitis. Common bile duct is patent as is evidenced by visualization of small Bowel.  Surgery not convinced that he has acute cholecystitis.  MRI abdomen/MRCP showed the following:  IMPRESSION: 1. Severe diffuse hepatic steatosis. No morphologic changes of hepatic cirrhosis. No liver masses. 2. No cholelithiasis. Bile ducts are within normal limits. CBD diameter 6 mm. No evidence of choledocholithiasis. 3. Indeterminate hemorrhagic/proteinaceous 2.7 cm interpolar left renal cortical lesion, incompletely characterized on this noncontrast MRI study. Suggest attention on follow-up outpatient MRI abdomen without and with IV contrast in 3 months. 4. Splenic hemosiderosis.  EGD 05/2017 by Dr. Fuller Plan for complaints of epigastric pain, atypical chest pain, and dysphagia showed the following:  - No endoscopic esophageal abnormality to explain patient's dysphagia. Esophagus dilated. - Erythematous mucosa in the gastric fundus and gastric body. Biopsied. - Small hiatal hernia. - Normal duodenal bulb and second portion of the duodenum.  Has been on prevacid 30 mg BID at home and protonix 20 mg daily  and pepcid 20 mg BID here in hospital.  Says that overall he feels like his reflux was well controlled at home but was taking Tums twice a week or so.  No abdominal pain.   Past Medical History:  Diagnosis Date   BPH (benign prostatic hyperplasia)    CAD (coronary artery disease)    a. NSTEMI troponin >65 with cath 11/2016 S/p PTCA & DES to mid circumflex. b. 05/2018 s/p DES to [facility]   Chronic chest pain    Chronic combined systolic and diastolic CHF (congestive heart failure) ([place])    a. EF 45% in 2018. b. EF 55-60% by echo 01/2017. c. EF 48% by nuc 07/2017.   CKD (chronic kidney disease), stage II    Cluster headache    hx of   Complication of anesthesia    difficult waking up"   Diverticulosis    Drug abuse (HCC)    hx of   GERD (gastroesophageal reflux disease)    Hiatal hernia    Hyperlipidemia    Hypertension    Hypothyroidism    Nontoxic multinodular goiter    NSTEMI (non-ST elevated myocardial infarction) ([place])    Perforated nasal septum    Pulmonary nodule --- CT 10/19/2014: Nodule is stable, no further routine x-rays 01/06/2009   Thrombocytopenia (HCC)    TIA (transient ischemic attack)    Trigeminal neuralgia    Tubular adenoma of colon 12/2015   Unspecified asthma(493.90)     Past Surgical History:  Procedure Laterality Date   APPENDECTOMY     CARDIAC CATHETERIZATION N/A 11/25/2014   Procedure: Left Heart Cath and Coronary Angiography;  Surgeon: Belva Crome, MD;  Location: [place] [facility];  Service: Cardiovascular;  Laterality: N/A;   CARDIAC CATHETERIZATION  12/08/2008   Archie Endo 08/24/2010   COLECTOMY  ~ 1976   "I had a blockage"   CORONARY STENT INTERVENTION N/A 11/27/2016   Procedure: CORONARY STENT INTERVENTION;  Surgeon: Belva Crome, MD;  Location: [place] [facility];  Service: Cardiovascular;  Laterality: N/A;   CORONARY STENT INTERVENTION N/A 06/10/2018   Procedure: CORONARY STENT INTERVENTION;  Surgeon: Nelva Bush, MD;   Location: [place] [facility];  Service: Cardiovascular;  Laterality: N/A;   CORONARY STENT PLACEMENT  11/27/2016   STENT XIENCE ALPINE RX 3.0X15 drug eluting stent was successfully placed   CYST EXCISION Left 10/31/2016   Jaw   DESTRUCTION TRIGEMINAL NERVE VIA NEUROLYTIC AGENT  x 3 , R side last ~2010   KNEE ARTHROSCOPY Right 06/26/2000   Arthroscopy followed by open lateral release/notes 09/06/2010   LEFT HEART CATH AND CORONARY ANGIOGRAPHY N/A 11/27/2016   Procedure: LEFT HEART CATH AND CORONARY ANGIOGRAPHY;  Surgeon: Belva Crome, MD;  Location: [place] [facility];  Service: Cardiovascular;  Laterality: N/A;   LEFT HEART CATH AND CORONARY ANGIOGRAPHY N/A 12/07/2016   Procedure: LEFT HEART CATH AND CORONARY ANGIOGRAPHY;  Surgeon: Nelva Bush, MD;  Location: [place] [facility];  Service: Cardiovascular;  Laterality: N/A;   LEFT HEART CATH AND CORONARY ANGIOGRAPHY N/A 06/10/2018   Procedure: LEFT HEART CATH AND CORONARY ANGIOGRAPHY;  Surgeon: Nelva Bush, MD;  Location: [place] [facility];  Service: Cardiovascular;  Laterality: N/A;   LEFT HEART CATH AND CORONARY ANGIOGRAPHY N/A 07/24/2018   Procedure: LEFT HEART CATH AND CORONARY ANGIOGRAPHY;  Surgeon: Belva Crome, MD;  Location: [place] [facility];  Service: Cardiovascular;  Laterality: N/A;   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 Ardine Bjork 09/06/2010   SHOULDER HEMI-ARTHROPLASTY Right 05/26/2010   revision/notes 05/27/2010    Prior to Admission medications   Medication Sig Start Date End Date Taking? Authorizing Provider  amLODipine (NORVASC) 10 MG tablet Take 1 tablet (10 mg total) by mouth daily. 06/07/18  Yes [name], DO  aspirin EC 81 MG tablet Take 81 mg by mouth daily.   Yes [provider]  calcium carbonate (TUMS EX) 750 MG chewable tablet Chew 2 tablets by mouth as needed for heartburn.   Yes [provider]  clopidogrel (PLAVIX) 75 MG tablet Take 75 mg by  mouth daily.   Yes [provider]  Evolocumab (REPATHA SURECLICK) 626 MG/ML SOAJ Inject 140 mg into the skin every 14 (fourteen) days. 09/12/18  Yes Dorothy Spark, MD  ezetimibe (ZETIA) 10 MG tablet Take 1 tablet (10 mg total) by mouth daily. 05/27/18  Yes [name], DO  FLUoxetine (PROZAC) 20 MG tablet Take 20 mg by mouth daily.   Yes [provider]  fluticasone (FLONASE) 50 MCG/ACT nasal spray Place 2 sprays into both nostrils daily. Patient taking differently: Place 2 sprays into both nostrils daily as needed for allergies.  05/27/18  Yes [name], DO  lansoprazole (PREVACID) 30 MG capsule Take 1 capsule (30 mg total) by mouth 2 (two) times daily before a meal. 05/27/18  Yes Wendling, Crosby Oyster, DO  levocetirizine (XYZAL) 5 MG tablet Take 1 tablet (5 mg total) by mouth every evening. 05/27/18  Yes [name], DO  losartan (COZAAR) 100 MG tablet Take 100 mg by mouth daily.   Yes [provider]  metoprolol succinate (TOPROL-XL) 50 MG 24 hr tablet Take 1 tablet (50 mg total)  by mouth daily. Take with or immediately following a meal. 10/09/18  Yes Wendling, Crosby Oyster, DO  nitroGLYCERIN (NITROSTAT) 0.4 MG SL tablet Place 1 tablet (0.4 mg total) under the tongue every 5 (five) minutes as needed for chest pain. 05/28/18  Yes Imogene Burn, PA-C  Polyethyl Glycol-Propyl Glycol (SYSTANE OP) Place 1 drop into both eyes every 3 (three) hours as needed (dry eyes).    Yes [provider]  rosuvastatin (CRESTOR) 40 MG tablet Take 1 tablet (40 mg total) by mouth at bedtime. 06/11/18  Yes Cheryln Manly, NP  traZODone (DESYREL) 50 MG tablet Take 0.5-1 tablets (25-50 mg total) by mouth at bedtime as needed for sleep. 09/25/18  Yes [name], DO  promethazine (PHENERGAN) 12.5 MG tablet Take 12.5 mg by mouth every 6 (six) hours as needed for nausea or vomiting.    [provider]    Current  Facility-Administered Medications  Medication Dose Route Frequency Provider Last Rate Last Dose   amLODipine (NORVASC) tablet 10 mg  10 mg Oral Daily Rise Patience, MD   10 mg at 11/01/18 0840   aspirin EC tablet 81 mg  81 mg Oral Daily Rise Patience, MD   81 mg at 11/01/18 6834   chlorhexidine (PERIDEX) 0.12 % solution 15 mL  15 mL Mouth Rinse BID Cristal Ford, DO   15 mL at 11/01/18 1962   clopidogrel (PLAVIX) tablet 75 mg  75 mg Oral Daily Rise Patience, MD   75 mg at 11/01/18 2297   ezetimibe (ZETIA) tablet 10 mg  10 mg Oral Daily Rise Patience, MD   10 mg at 11/01/18 0839   famotidine (PEPCID) tablet 20 mg  20 mg Oral BID Cristal Ford, DO   20 mg at 11/01/18 0848   FLUoxetine (PROZAC) capsule 20 mg  20 mg Oral Daily Rise Patience, MD   20 mg at 11/01/18 0840   losartan (COZAAR) tablet 100 mg  100 mg Oral Daily Rise Patience, MD   100 mg at 11/01/18 0840   MEDLINE mouth rinse  15 mL Mouth Rinse q12n4p Cristal Ford, DO   15 mL at 10/31/18 1243   metoprolol succinate (TOPROL-XL) 24 hr tablet 50 mg  50 mg Oral Daily Rise Patience, MD   50 mg at 11/01/18 0839   nitroGLYCERIN (NITROSTAT) SL tablet 0.4 mg  0.4 mg Sublingual Q5 Min x 3 PRN Rise Patience, MD   0.4 mg at 10/29/18 1041   ondansetron (ZOFRAN) tablet 4 mg  4 mg Oral Q6H PRN Rise Patience, MD       Or   ondansetron [facility]) injection 4 mg  4 mg Intravenous Q6H PRN Rise Patience, MD   4 mg at 11/01/18 9892   oxyCODONE (Oxy IR/ROXICODONE) immediate release tablet 5 mg  5 mg Oral Q4H PRN Cristal Ford, DO   5 mg at 11/01/18 1194   pantoprazole (PROTONIX) EC tablet 20 mg  20 mg Oral Daily Rise Patience, MD   20 mg at 11/01/18 0847   potassium chloride SA (K-DUR) CR tablet 40 mEq  40 mEq Oral Q4H Mikhail, [place], DO   40 mEq at 11/01/18 1740   traZODone (DESYREL) tablet 25-50 mg  25-50 mg Oral QHS PRN Rise Patience, MD         Allergies as of 10/29/2018 - Review Complete 10/29/2018  Allergen Reaction Noted   Tramadol Palpitations 04/18/2017    Family History  Problem Relation Age of Onset   Diabetes Mother    Hyperlipidemia Mother    Hypertension Mother    Hypertension Father    Heart disease Father    Cancer Father    Sudden death Father        OCT 13-Aug-2009   CAD Father    Heart failure Father    Mesothelioma Father    Heart disease Maternal Aunt    Prostate cancer Neg Hx    Colon cancer Neg Hx     Social History   Socioeconomic History   Marital status: Married    Spouse name: Not on file   Number of children: 2   Years of education: Not on file   Highest education level: Not on file  Occupational History   Occupation: Merchandiser, retail [employer] resource strain: Not hard at all   Food insecurity    Worry: Never true    Inability: Never true   Transportation needs    Medical: No    Non-medical: No  Tobacco Use   Smoking status: Former Smoker    Packs/day: 0.25    Years: 12.00    Pack years: 3.00    Types: Cigarettes    Quit date: 11/15/2016    Years since quitting: 1.9   Smokeless tobacco: Never Used  Substance and Sexual Activity   Alcohol use: Yes    Alcohol/week: 1.0 standard drinks    Types: 1 Shots of liquor per week    Comment: occ   Drug use: No   Sexual activity: Yes  Lifestyle   Physical activity    Days per week: 3 days    Minutes per session: 30 min   Stress: Only a little  Relationships   Social connections    Talks on phone: More than three times a week    Gets together: Three times a week    Attends religious service: More than 4 times per year    Active member of club or organization: No    Attends meetings of clubs or organizations: Not on file    Relationship status: Married   Intimate partner violence    Fear of current or ex partner: No    Emotionally abused: No    Physically abused: No     Forced sexual activity: No  Other Topics Concern   Not on file  Social History Narrative   HS[employer]   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  Physical Exam: Vital signs in last 24 hours: Temp:  [97.6 F (36.4 C)-98.3 F (36.8 C)] 98.1 F (36.7 C) (07/10 0725) Pulse Rate:  [60-66] 66 (07/10 0725) Resp:  [13-16] 15 (07/10 0725) BP: (102-114)/(73-85) 113/85 (07/10 0725) SpO2:  [98 %-100 %] 100 % (07/10 0017) Last BM Date: 10/29/18 General:  Alert, Well-developed, well-nourished, pleasant and cooperative in NAD Head:  Normocephalic and atraumatic. Eyes:  Sclera clear, no icterus.  Conjunctiva pink. Ears:  Normal auditory acuity. Mouth:  No deformity or lesions.   Lungs:  Clear throughout to auscultation.  No wheezes, crackles, or rhonchi.  Heart:  Regular rate and rhythm. Abdomen:  Soft, non-distended.  BS present.  Non-tender. Msk:  Symmetrical without gross deformities. Pulses:  Normal pulses noted. Extremities:  Without clubbing or edema. Neurologic:  Alert and oriented x 4;  grossly normal neurologically. Skin:  Intact without significant lesions or rashes. Psych:  Alert and cooperative. Normal mood and affect.  Lab Results: Recent Labs    10/30/18 0838 10/31/18 0303 11/01/18 0219  WBC 6.7 5.6 6.1  HGB 14.7 13.4 13.1  HCT 40.4 38.1* 36.9*  PLT 55* 61* 89*   BMET Recent Labs    10/30/18 0037 10/31/18 0303 11/01/18 0219  NA 131* 132* 135  K 3.3* 3.6 2.9*  CL 97* 99 100  CO2 15* 17* 23  GLUCOSE 135* 111* 145*  BUN 23* 19 15  CREATININE 1.76* 1.39* 1.33*  CALCIUM 9.2 8.8* 9.2   LFT Recent Labs    10/30/18 0037  11/01/18 0219  PROT 7.3   < > 6.5  ALBUMIN 3.6   < > 3.1*  AST 177*   < > 102*  ALT 92*   < > 66*  ALKPHOS 53   < > 72  BILITOT 3.2*   < > 1.3*  BILIDIR 1.2*  --   --   IBILI 2.0*  --   --    < > =  values in this interval not displayed.   PT/INR Recent Labs    10/29/18 1550  LABPROT 14.9  INR 1.2   Hepatitis Panel Recent Labs    10/30/18 0037  HEPBSAG Negative  HCVAB <0.1  HEPAIGM Negative  HEPBIGM Negative   Studies/Results: Mr Abdomen Mrcp Wo Contrast  Result Date: 10/30/2018 CLINICAL DATA:  Inpatient. Chest pain. Dyspnea. Nausea and vomiting. Hepatic steatosis. Elevated liver function tests. EXAM: MRI ABDOMEN WITHOUT CONTRAST  (INCLUDING MRCP) TECHNIQUE: Multiplanar multisequence MR imaging of the abdomen was performed. Heavily T2-weighted images of the biliary and pancreatic ducts were obtained, and three-dimensional MRCP images were rendered by post processing. COMPARISON:  10/29/2018 CT abdomen/pelvis and abdominal sonogram. 10/09/2016 MRI abdomen. FINDINGS: Lower chest: No acute abnormality at the lung bases. Hepatobiliary: Normal liver size and configuration. Severe diffuse hepatic steatosis. No liver mass. Normal gallbladder with no cholelithiasis. No significant biliary ductal dilatation. Common bile duct diameter 6 mm. No evidence of choledocholithiasis. No biliary strictures, beading or masses. Pancreas: No pancreatic mass or duct dilation.  No pancreas divisum. Spleen: Normal size. No mass.  Splenic hemosiderosis. Adrenals/Urinary Tract: Normal adrenals. No hydronephrosis. No right renal masses. Simple appearing 2.6 cm anterior upper left renal cyst. Indeterminate 2.7 cm interpolar left renal cortical lesion (series 4/image 30) with T1 hyperintensity and heterogeneous T2 hypointensity. Stomach/Bowel: Normal non-distended stomach. Visualized small and large bowel is normal caliber, with no bowel wall thickening. Vascular/Lymphatic: Normal caliber abdominal aorta. No pathologically enlarged lymph nodes in the abdomen. Other: No abdominal ascites or focal fluid collection. Musculoskeletal: No aggressive appearing focal osseous lesions. IMPRESSION: 1. Severe diffuse hepatic  steatosis. No morphologic changes of hepatic cirrhosis. No liver masses. 2. No cholelithiasis. Bile ducts are within normal limits. CBD diameter 6 mm. No evidence of choledocholithiasis. 3. Indeterminate hemorrhagic/proteinaceous 2.7 cm interpolar left renal cortical lesion, incompletely characterized on this noncontrast MRI study. Suggest attention on follow-up outpatient MRI abdomen without and with IV contrast in 3 months. 4. Splenic hemosiderosis. Electronically Signed   By: Ilona Sorrel M.D.   On: 10/30/2018 14:20   Mr 3d Recon At Scanner  Result Date: 10/30/2018 CLINICAL DATA:  Inpatient. Chest pain. Dyspnea. Nausea and vomiting. Hepatic steatosis. Elevated liver function tests. EXAM: MRI ABDOMEN WITHOUT CONTRAST  (INCLUDING MRCP) TECHNIQUE: Multiplanar multisequence MR imaging of  the abdomen was performed. Heavily T2-weighted images of the biliary and pancreatic ducts were obtained, and three-dimensional MRCP images were rendered by post processing. COMPARISON:  10/29/2018 CT abdomen/pelvis and abdominal sonogram. 10/09/2016 MRI abdomen. FINDINGS: Lower chest: No acute abnormality at the lung bases. Hepatobiliary: Normal liver size and configuration. Severe diffuse hepatic steatosis. No liver mass. Normal gallbladder with no cholelithiasis. No significant biliary ductal dilatation. Common bile duct diameter 6 mm. No evidence of choledocholithiasis. No biliary strictures, beading or masses. Pancreas: No pancreatic mass or duct dilation.  No pancreas divisum. Spleen: Normal size. No mass.  Splenic hemosiderosis. Adrenals/Urinary Tract: Normal adrenals. No hydronephrosis. No right renal masses. Simple appearing 2.6 cm anterior upper left renal cyst. Indeterminate 2.7 cm interpolar left renal cortical lesion (series 4/image 30) with T1 hyperintensity and heterogeneous T2 hypointensity. Stomach/Bowel: Normal non-distended stomach. Visualized small and large bowel is normal caliber, with no bowel wall  thickening. Vascular/Lymphatic: Normal caliber abdominal aorta. No pathologically enlarged lymph nodes in the abdomen. Other: No abdominal ascites or focal fluid collection. Musculoskeletal: No aggressive appearing focal osseous lesions. IMPRESSION: 1. Severe diffuse hepatic steatosis. No morphologic changes of hepatic cirrhosis. No liver masses. 2. No cholelithiasis. Bile ducts are within normal limits. CBD diameter 6 mm. No evidence of choledocholithiasis. 3. Indeterminate hemorrhagic/proteinaceous 2.7 cm interpolar left renal cortical lesion, incompletely characterized on this noncontrast MRI study. Suggest attention on follow-up outpatient MRI abdomen without and with IV contrast in 3 months. 4. Splenic hemosiderosis. Electronically Signed   By: Ilona Sorrel M.D.   On: 10/30/2018 14:20   Nm Hepato W/eject Fract  Result Date: 11/01/2018 CLINICAL DATA:  Elevated liver enzymes. Abnormal gallbladder appearance on CT EXAM: NUCLEAR MEDICINE HEPATOBILIARY IMAGING TECHNIQUE: Sequential images of the abdomen were obtained out to 90 minutes following intravenous administration of radiopharmaceutical. Patient unable to receive opioids. Delayed 6 hour image therefore obtained. RADIOPHARMACEUTICALS:  5.2 mCi Tc-38m  Choletec IV COMPARISON:  None. FINDINGS: Liver uptake of radiotracer is unremarkable. There is prompt visualization of small bowel, indicating patency of the common bile duct. Gallbladder did not visualize during the course of this study. IMPRESSION: Nonvisualization of the gallbladder, a finding felt to be indicative of cystic duct obstruction. Cystic duct obstruction is indicative of acute cholecystitis. Common bile duct is patent as is evidenced by visualization of small bowel. Electronically Signed   By: Lowella Grip III M.D.   On: 11/01/2018 08:14   IMPRESSION:  *Elevated LFT's:  HIDA suggestive of acute cholecystitis.  Came in with complaints of nausea, vomiting, atypical chest pain.  Acute hep  panel negative.  Multiple other imaging studies suggestive of only severe hepatic steatosis.  LFT's trending down now. *Nausea, vomiting and atypical chest pain:  Sounds like it certainly could be gallbladder source as he had these same complaints in the past and had EGD for evaluation of these issues in 2019.  Is on PPI twice daily at home.  PLAN: -Will discuss with Dr. Ardis Hughs, but sounds like he may need cholecystectomy if he is a surgical candidate.     Laban Emperor. Zehr  11/01/2018, 9:42 AM   ________________________________________________________________________  Velora Heckler GI MD note:  I personally examined the patient, reviewed the data and agree with the assessment and plan described above.  He was admitted with epigastric and chest pains.  LFTs were elevated and are normalizing.  HIDA scan is not normal and may indicate cholecystitis.  General surgery to decide if he needs cholecystectomy.  I am not planning EGD because  he had one 05/2017 for similar issues and it was essentially normal.  If he is not going to have his gallbladder removed, he is safe for d/c from my perspective. In that case he would need follow up in our office in 4-6 weeks to repeat his LFTs and see how he is doing.  Please call or page with any further questions or concerns.   Owens Loffler, MD [facility] Gastroenterology Pager [phone]

## 2018-11-05 ENCOUNTER — Telehealth: Payer: Self-pay | Admitting: *Deleted

## 2018-11-05 NOTE — Telephone Encounter (Signed)
Transition Care Management Follow-up Telephone Call   Date discharged? 11/01/18   How have you been since you were released from the hospital? "Doing well"   Do you understand why you were in the hospital? yes   Do you understand the discharge instructions? yes   Where were you discharged to? Home   Items Reviewed:  Medications reviewed: pt states he will discuss at PCP f/u visit  Allergies reviewed: yes  Dietary changes reviewed: yes  Referrals reviewed: yes   Functional Questionnaire:   Activities of Daily Living (ADLs):   He states they are independent in the following: ambulation, bathing and hygiene, feeding, continence, grooming, toileting and dressing States they require assistance with the following: na   Any transportation issues/concerns?: no   Any patient concerns? no   Pt  will call back to scheduled appt.  Confirmed with patient if condition begins to worsen call PCP or go to the ER.  Patient was given the office number and encouraged to call back with question or concerns.  : yes

## 2018-11-06 ENCOUNTER — Encounter: Payer: Self-pay | Admitting: Family Medicine

## 2018-11-06 ENCOUNTER — Ambulatory Visit (INDEPENDENT_AMBULATORY_CARE_PROVIDER_SITE_OTHER): Payer: 59 | Admitting: Family Medicine

## 2018-11-06 ENCOUNTER — Other Ambulatory Visit: Payer: Self-pay

## 2018-11-06 DIAGNOSIS — R945 Abnormal results of liver function studies: Secondary | ICD-10-CM

## 2018-11-06 DIAGNOSIS — N179 Acute kidney failure, unspecified: Secondary | ICD-10-CM

## 2018-11-06 DIAGNOSIS — R319 Hematuria, unspecified: Secondary | ICD-10-CM | POA: Diagnosis not present

## 2018-11-06 DIAGNOSIS — R7989 Other specified abnormal findings of blood chemistry: Secondary | ICD-10-CM

## 2018-11-06 DIAGNOSIS — K760 Fatty (change of) liver, not elsewhere classified: Secondary | ICD-10-CM

## 2018-11-06 NOTE — Progress Notes (Signed)
Chief Complaint  Patient presents with  . Hospitalization Follow-up    HPI [name] is a 58 y.o. y.o. male who presents for a transition of care visit.  Pt was discharged from [facility] on 11/01/2018.  Within 48 business hours of discharge our office contacted pt via telephone to coordinate care and needs.   Concerned for MI, went to ED at our office's rec. Found to have AKI and dehydration. Received IVF's and improved. Scanning showed likely renal cysts in setting of gross hematuria. He has not had issues since being dc'd. Is on DAPT for CAD. Appetite is back. Drinking lots of fluids. Denies injury, fevers, CP, SOB, vision changes.   Past Medical History:  Diagnosis Date  . BPH (benign prostatic hyperplasia)   . CAD (coronary artery disease)    a. NSTEMI troponin >65 with cath 11/2016 S/p PTCA & DES to mid circumflex. b. 05/2018 s/p DES to [facility].  Marland Kitchen Chronic chest pain   . Chronic combined systolic and diastolic CHF (congestive heart failure) ([place])    a. EF 45% in 2016/07/27. b. EF 55-60% by echo 01/2017. c. EF 48% by nuc 07/2017.  . CKD (chronic kidney disease), stage II   . Cluster headache    hx of  . Complication of anesthesia    difficult waking up"  . Diverticulosis   . Drug abuse ([place])    hx of  . GERD (gastroesophageal reflux disease)   . Hiatal hernia   . Hyperlipidemia   . Hypertension   . Hypothyroidism   . Nontoxic multinodular goiter   . NSTEMI (non-ST elevated myocardial infarction) ([place])   . Perforated nasal septum   . Pulmonary nodule --- CT 10/19/2014: Nodule is stable, no further routine x-rays 01/06/2009  . Thrombocytopenia ([place])   . TIA (transient ischemic attack)   . Trigeminal neuralgia   . Tubular adenoma of colon 12/2015  . Unspecified asthma(493.90)    @SURG @ Family History  Problem Relation Age of Onset  . Diabetes Mother   . Hyperlipidemia Mother   . Hypertension Mother   . Hypertension Father   . Heart disease Father   . Cancer Father   . Sudden  death Father        OCT July 27, 2009  . CAD Father   . Heart failure Father   . Mesothelioma Father   . Heart disease Maternal Aunt   . Prostate cancer Neg Hx   . Colon cancer Neg Hx    Allergies as of 11/06/2018      Reactions   Tramadol Palpitations      Medication List       Accurate as of November 06, 2018 11:47 AM. If you have any questions, ask your nurse or doctor.        amLODipine 10 MG tablet Commonly known as: NORVASC Take 1 tablet (10 mg total) by mouth daily.   aspirin EC 81 MG tablet Take 81 mg by mouth daily.   calcium carbonate 750 MG chewable tablet Commonly known as: TUMS EX Chew 2 tablets by mouth as needed for heartburn.   clopidogrel 75 MG tablet Commonly known as: PLAVIX Take 75 mg by mouth daily.   Evolocumab 140 MG/ML Soaj Commonly known as: Repatha SureClick Inject 825 mg into the skin every 14 (fourteen) days.   ezetimibe 10 MG tablet Commonly known as: ZETIA Take 1 tablet (10 mg total) by mouth daily.   FLUoxetine 20 MG tablet Commonly known as: PROZAC Take 20 mg by mouth  daily.   fluticasone 50 MCG/ACT nasal spray Commonly known as: FLONASE Place 2 sprays into both nostrils daily. What changed:   when to take this  reasons to take this   lansoprazole 30 MG capsule Commonly known as: PREVACID Take 1 capsule (30 mg total) by mouth 2 (two) times daily before a meal.   levocetirizine 5 MG tablet Commonly known as: XYZAL Take 1 tablet (5 mg total) by mouth every evening.   losartan 100 MG tablet Commonly known as: COZAAR Take 100 mg by mouth daily.   metoprolol succinate 50 MG 24 hr tablet Commonly known as: TOPROL-XL Take 1 tablet (50 mg total) by mouth daily. Take with or immediately following a meal.   nitroGLYCERIN 0.4 MG SL tablet Commonly known as: NITROSTAT Place 1 tablet (0.4 mg total) under the tongue every 5 (five) minutes as needed for chest pain.   ondansetron 4 MG tablet Commonly known as: ZOFRAN Take 1 tablet (4 mg  total) by mouth every 6 (six) hours as needed for nausea.   promethazine 12.5 MG tablet Commonly known as: PHENERGAN Take 1 tablet (12.5 mg total) by mouth every 6 (six) hours as needed for refractory nausea / vomiting.   SYSTANE OP Place 1 drop into both eyes every 3 (three) hours as needed (dry eyes).   traZODone 50 MG tablet Commonly known as: DESYREL Take 0.5-1 tablets (25-50 mg total) by mouth at bedtime as needed for sleep.       ROS:  Constitutional: No fevers or chills, no weight loss HEENT: No headaches, hearing loss, or runny nose, no sore throat Heart: No chest pain Lungs: No SOB, no cough Abd: No bowel changes, no pain, no N/V GU: No current urinary complaints Neuro: No numbness, tingling or weakness Msk: No joint or muscle pain  Objective No conversational dyspnea Age appropriate judgment and insight Nml affect and mood  AKI (acute kidney injury) ([place]) - Plan: Cont pushing fluids, reck Cr.   Elevated LFTs - Plan: Reck  Fatty liver - Plan: Reck; hold off on GS referral for now, cont with GI.  Hematuria, unspecified type - Plan: ck microscopy. If present, may need to involve Urology team. I will check 2-3 times to ensure resolution if normal.   Discharge summary and medication list have been reviewed/reconciled.  Labs pending at the time of discharge have been reviewed or are still pending at the time of this visit.  Follow-up labs and appointments have been ordered and/or coordinated appropriately.  TRANSITIONAL CARE MANAGEMENT CERTIFICATION:  I certify the following are true:   1. Communication with the patient/care giver was made within 2 business days of discharge.  2. Complexity of Medical decision making is moderate.  3. Face to face visit occurred within 14 days of discharge.   F/u as originally scheduled. The patient voiced understanding and agreement to the plan.  [place], DO 11/06/18 11:47 AM

## 2018-11-12 NOTE — Progress Notes (Signed)
Virtual Visit via Video Note   This visit type was conducted due to national recommendations for restrictions regarding the COVID-19 Pandemic (e.g. social distancing) in an effort to limit this patient's exposure and mitigate transmission in our community.  Due to his co-morbid illnesses, this patient is at least at moderate risk for complications without adequate follow up.  This format is felt to be most appropriate for this patient at this time.  All issues noted in this document were discussed and addressed.  A limited physical exam was performed with this format.  Please refer to the patient's chart for his consent to telehealth for [facility].   Date:  11/15/2018   ID:  [name], DOB [birth year], MRN [id number]  Patient Location: Home Provider Location: Home  PCP:  [name], DO  Cardiologist:  Ena Dawley, MD  Electrophysiologist:  None   Evaluation Performed:  Follow-Up Visit  After 2 months Chief Complaint: Chest pain, palpitations.  History of Present Illness:    [name] is a 58 y.o. male with history of CAD status post STEMI treated with DES to the circumflex OM 11/2016 with follow-up cath 1 week later showed patent stent and LVEF improved to 55 to 60%.  No ischemia on Lexiscan 07/2017.  Patient had repeat nuclear stress test for chest pain and had evidence of ischemia along the a.m. Patient underwent cardiac catheterization 06/10/2018 and had successful DES to the mid RCA and had residual 70% inferior branch of the OM 2 with patent OM 2 stent and moderate nonobstructive LAD disease.  If patient has recurrent chest pain refractory to antianginal therapy PCI to inferior branch to the OM 2 could be considered although this is a small vessel.  Aggressive medical management recommended.  He was seen by Estella Husk on 06/26/2018 and stated that RCA stent has made all the difference in the world for him.  No further chest pain.  Complains of insomnia.   Blood pressure has been running high at home.  He was again readmitted on July 23, 2018 with chest pains and underwent repeat cardiac catheterization that showed patent RCA stent with no progression of disease.  09/11/2018 -he states that he continues to have chest pains not related to exertion, might be slightly worse on deep inspiration, it comes and goes, is left-sided and sharp.  Not associated with shortness of breath.  He has been compliant with rosuvastatin but his LDL is 135 despite that.  He was previously enrolled in cardiac rehab but this was uninterrupted because of COVID-19.  11/15/2018 - at the last visit complained of atypical chest pain suspicious for acute pericarditis, we added colchicine 0.6 mg p.o. twice daily for 3 months and follow-up in 4 weeks to see what his symptoms are at this point.  I did not add ibuprofen given that he is already on dual antiplatelet therapy. He was admitted on 11/01/2018 with nausea/vomiting, chest pain, CTA shows no evidence of pulmonary embolus, cardiology was consulted and didn't feel that symptoms were cardiac in origin. He was discharged on antiemetics. He also had elevated LFTs. Hepatitis panel unremarkable. Abdominal ultrasound: negative for gallstones or acute cholecystitis. Small volume of sludge in the gallbladder. Fatty infiltration of liver. Attributed to dehydration. OP follow up with general surgery to evaluate for GB removal.  He continues to have dyspnea on exertion but no chest pain, he describes situations like when cleaning his house when he has to stop and sit down.  He is getting  enrolled in cardiac rehab.  He has no lower extremity edema no orthopnea proximal nocturnal dyspnea.  He has been compliant with his medications.  He has recently lost 15 pounds.  The patient does not have symptoms concerning for COVID-19 infection (fever, chills, cough, or new shortness of breath).   Past Medical History:  Diagnosis Date  . BPH (benign  prostatic hyperplasia)   . CAD (coronary artery disease)    a. NSTEMI troponin >65 with cath 11/2016 S/p PTCA & DES to mid circumflex. b. 05/2018 s/p DES to [facility].  Marland Kitchen Chronic chest pain   . Chronic combined systolic and diastolic CHF (congestive heart failure) ([place])    a. EF 45% in 2018. b. EF 55-60% by echo 01/2017. c. EF 48% by nuc 07/2017.  . CKD (chronic kidney disease), stage II   . Cluster headache    hx of  . Complication of anesthesia    difficult waking up"  . Diverticulosis   . Drug abuse ([place])    hx of  . GERD (gastroesophageal reflux disease)   . Hiatal hernia   . Hyperlipidemia   . Hypertension   . Hypothyroidism   . Nontoxic multinodular goiter   . NSTEMI (non-ST elevated myocardial infarction) ([place])   . Perforated nasal septum   . Pulmonary nodule --- CT 10/19/2014: Nodule is stable, no further routine x-rays 01/06/2009  . Thrombocytopenia ([place])   . TIA (transient ischemic attack)   . Trigeminal neuralgia   . Tubular adenoma of colon 12/2015  . Unspecified asthma(493.90)    Past Surgical History:  Procedure Laterality Date  . APPENDECTOMY    . CARDIAC CATHETERIZATION N/A 11/25/2014   Procedure: Left Heart Cath and Coronary Angiography;  Surgeon: Belva Crome, MD;  Location: [place] [facility];  Service: Cardiovascular;  Laterality: N/A;  . CARDIAC CATHETERIZATION  12/08/2008   Archie Endo 08/24/2010  . COLECTOMY  ~ 1976   "I had a blockage"  . CORONARY STENT INTERVENTION N/A 11/27/2016   Procedure: CORONARY STENT INTERVENTION;  Surgeon: Belva Crome, MD;  Location: [place] [facility];  Service: Cardiovascular;  Laterality: N/A;  . CORONARY STENT INTERVENTION N/A 06/10/2018   Procedure: CORONARY STENT INTERVENTION;  Surgeon: Nelva Bush, MD;  Location: [place] [facility];  Service: Cardiovascular;  Laterality: N/A;  . CORONARY STENT PLACEMENT  11/27/2016   STENT XIENCE ALPINE RX 3.0X15 drug eluting stent was successfully placed  . CYST EXCISION Left 10/31/2016   Jaw   . DESTRUCTION TRIGEMINAL NERVE VIA NEUROLYTIC AGENT  x 3 , R side last ~2010  . KNEE ARTHROSCOPY Right 06/26/2000   Arthroscopy followed by open lateral release/notes 09/06/2010  . LEFT HEART CATH AND CORONARY ANGIOGRAPHY N/A 11/27/2016   Procedure: LEFT HEART CATH AND CORONARY ANGIOGRAPHY;  Surgeon: Belva Crome, MD;  Location: [place] [facility];  Service: Cardiovascular;  Laterality: N/A;  . LEFT HEART CATH AND CORONARY ANGIOGRAPHY N/A 12/07/2016   Procedure: LEFT HEART CATH AND CORONARY ANGIOGRAPHY;  Surgeon: Nelva Bush, MD;  Location: [place] [facility];  Service: Cardiovascular;  Laterality: N/A;  . LEFT HEART CATH AND CORONARY ANGIOGRAPHY N/A 06/10/2018   Procedure: LEFT HEART CATH AND CORONARY ANGIOGRAPHY;  Surgeon: Nelva Bush, MD;  Location: [place] [facility];  Service: Cardiovascular;  Laterality: N/A;  . LEFT HEART CATH AND CORONARY ANGIOGRAPHY N/A 07/24/2018   Procedure: LEFT HEART CATH AND CORONARY ANGIOGRAPHY;  Surgeon: Belva Crome, MD;  Location: [place] [facility];  Service: Cardiovascular;  Laterality: N/A;  . 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 Ardine Bjork 09/06/2010  . SHOULDER HEMI-ARTHROPLASTY Right 05/26/2010   revision/notes 05/27/2010     Current Meds  Medication Sig  . aspirin EC 81 MG tablet Take 81 mg by mouth daily.  . calcium carbonate (TUMS EX) 750 MG chewable tablet Chew 2 tablets by mouth as needed for heartburn.  . clopidogrel (PLAVIX) 75 MG tablet Take 75 mg by mouth daily.  . Evolocumab (REPATHA SURECLICK) 329 MG/ML SOAJ Inject 140 mg into the skin every 14 (fourteen) days.  Marland Kitchen ezetimibe (ZETIA) 10 MG tablet Take 1 tablet (10 mg total) by mouth daily.  Marland Kitchen FLUoxetine (PROZAC) 20 MG tablet Take 20 mg by mouth daily.  . fluticasone (FLONASE) 50 MCG/ACT nasal spray Place 2 sprays into both nostrils daily as needed for allergies or rhinitis.  Marland Kitchen lansoprazole (PREVACID) 30 MG capsule Take 1 capsule (30 mg total) by mouth 2 (two)  times daily before a meal.  . levocetirizine (XYZAL) 5 MG tablet Take 1 tablet (5 mg total) by mouth every evening.  Marland Kitchen losartan (COZAAR) 100 MG tablet Take 100 mg by mouth daily.  . metoprolol succinate (TOPROL-XL) 50 MG 24 hr tablet Take 1 tablet (50 mg total) by mouth daily. Take with or immediately following a meal.  . nitroGLYCERIN (NITROSTAT) 0.4 MG SL tablet Place 1 tablet (0.4 mg total) under the tongue every 5 (five) minutes as needed for chest pain.  Marland Kitchen ondansetron (ZOFRAN) 4 MG tablet Take 1 tablet (4 mg total) by mouth every 6 (six) hours as needed for nausea.  Vladimir Faster Glycol-Propyl Glycol (SYSTANE OP) Place 1 drop into both eyes every 3 (three) hours as needed (dry eyes).   . promethazine (PHENERGAN) 12.5 MG tablet Take 1 tablet (12.5 mg total) by mouth every 6 (six) hours as needed for refractory nausea / vomiting.  . traZODone (DESYREL) 50 MG tablet Take 0.5-1 tablets (25-50 mg total) by mouth at bedtime as needed for sleep.  . [DISCONTINUED] amLODipine (NORVASC) 10 MG tablet Take 1 tablet (10 mg total) by mouth daily.     Allergies:   Tramadol   Social History   Tobacco Use  . Smoking status: Former Smoker    Packs/day: 0.25    Years: 12.00    Pack years: 3.00    Types: Cigarettes    Quit date: 11/15/2016    Years since quitting: 2.0  . Smokeless tobacco: Never Used  Substance Use Topics  . Alcohol use: Yes    Alcohol/week: 1.0 standard drinks    Types: 1 Shots of liquor per week    Comment: occ  . Drug use: No     Family Hx: The patient's family history includes CAD in his father; Cancer in his father; Diabetes in his mother; Heart disease in his father and maternal aunt; Heart failure in his father; Hyperlipidemia in his mother; Hypertension in his father and mother; Mesothelioma in his father; Sudden death in his father. There is no history of Prostate cancer or Colon cancer.  ROS:   Please see the history of present illness.    All other systems reviewed and  are negative.  Prior CV studies:   The following studies were reviewed today:  Labs/Other Tests and Data Reviewed:    EKG:  No ECG reviewed.  Recent Labs: 07/26/2018: TSH 2.925 10/29/2018: B Natriuretic Peptide 37.0 11/01/2018: ALT 66; BUN 15; Creatinine, Ser 1.33; Hemoglobin 13.1; Magnesium 2.1; Platelets 89; Potassium 3.7; Sodium  135   Recent Lipid Panel Lab Results  Component Value Date/Time   CHOL 262 (H) 08/03/2018 03:26 AM   CHOL 245 (H) 05/29/2017 09:15 AM   TRIG 151 (H) 08/12/2018 03:26 AM   HDL 97 08/17/2018 03:26 AM   HDL 66 05/29/2017 09:15 AM   CHOLHDL 2.7 08/02/2018 03:26 AM   LDLCALC 135 (H) 08/21/2018 03:26 AM   LDLCALC 160 (H) 05/29/2017 09:15 AM   LDLDIRECT 209.8 07/20/2009 09:51 AM   Wt Readings from Last 3 Encounters:  11/15/18 226 lb (102.5 kg)  10/29/18 226 lb 6.6 oz (102.7 kg)  10/17/18 234 lb (106.1 kg)    Objective:    Vital Signs:  BP (!) 135/92   Wt 226 lb (102.5 kg)   BMI 32.43 kg/m    VITAL SIGNS:  reviewed     ASSESSMENT & PLAN:    1. Chest Pain with Known CAD-status post recent PCI:  -Status post DES RCA 05/2018, Previously placed stent patent and no significant progression of residual disease on cath 4/1, he had 50% -Continue ASA, BB, ARB, statin and especially Plavix.  Continue Ranexa, add imdur -He is doing better with improved chest pain but ongoing dyspnea on exertion, we will add Imdur 30 mg daily, he is encouraged to continue losing weight and exercising and continue cardiac rehab. Follow-up in 4 months.  2. Chronic Combined CHF: - LVEF now 62% on cardiac MRI today with only 25 to 50% endocardial scar in the basal and mid inferolateral walls with mild hypokinesis in the mid inferolateral wall.  Previously LVEF 50% on cardiac cath. - He appears euvolemic now.  3. Hypertension -Blood pressure still elevated, will add Imdur 30 mg daily.  4. Hyperlipidemia -He was started on Repatha, and Zetia, he has follow-up appointment in  the lipid clinic and labs on December 03, 2018.   COVID-19 Education: The signs and symptoms of COVID-19 were discussed with the patient and how to seek care for testing (follow up with PCP or arrange E-visit).  The importance of social distancing was discussed today.  Time:   Today, I have spent 25 minutes with the patient with telehealth technology discussing the above problems.     Medication Adjustments/Labs and Tests Ordered: Current medicines are reviewed at length with the patient today.  Concerns regarding medicines are outlined above.   Tests Ordered: No orders of the defined types were placed in this encounter.   Medication Changes: No orders of the defined types were placed in this encounter.   Disposition:  Follow up in 4 month(s)  Signed, Ena Dawley, MD  11/15/2018 11:17 AM    [place]

## 2018-11-13 ENCOUNTER — Telehealth (HOSPITAL_COMMUNITY): Payer: Self-pay | Admitting: Emergency Medicine

## 2018-11-13 NOTE — Telephone Encounter (Signed)
Left message on voicemail with name and callback number Nery Frappier RN Navigator Cardiac Imaging [facility] [phone] Office [phone] Cell  

## 2018-11-14 ENCOUNTER — Telehealth (HOSPITAL_COMMUNITY): Payer: Self-pay | Admitting: Emergency Medicine

## 2018-11-14 MED ORDER — LORAZEPAM 2 MG/ML IJ SOLN
1.0000 mg | Freq: Once | INTRAMUSCULAR | Status: DC
  Administered 2018-11-15: 1 mg via INTRAVENOUS

## 2018-11-14 NOTE — Telephone Encounter (Signed)
Reaching out to patient to offer assistance regarding upcoming cardiac imaging study; pt verbalizes understanding of appt date/time, parking situation and where to check in, pre-test NPO status and medications ordered, and verified current allergies; name and call back number provided for further questions should they arise Inette Doubrava RN Navigator Cardiac Imaging [facility] [phone] office [phone] cell 

## 2018-11-15 ENCOUNTER — Encounter: Payer: Self-pay | Admitting: Cardiology

## 2018-11-15 ENCOUNTER — Encounter: Payer: Self-pay | Admitting: *Deleted

## 2018-11-15 ENCOUNTER — Other Ambulatory Visit: Payer: Self-pay | Admitting: Family Medicine

## 2018-11-15 ENCOUNTER — Other Ambulatory Visit: Payer: Self-pay

## 2018-11-15 ENCOUNTER — Ambulatory Visit (HOSPITAL_COMMUNITY)
Admission: RE | Admit: 2018-11-15 | Discharge: 2018-11-15 | Disposition: A | Discharge status: Home / Self Care | Payer: 59 | Source: Ambulatory Visit | Attending: Physician Assistant | Admitting: Physician Assistant

## 2018-11-15 ENCOUNTER — Telehealth (INDEPENDENT_AMBULATORY_CARE_PROVIDER_SITE_OTHER): Payer: 59 | Admitting: Cardiology

## 2018-11-15 VITALS — BP 135/92 | Wt 226.0 lb

## 2018-11-15 DIAGNOSIS — Z95820 Peripheral vascular angioplasty status with implants and grafts: Secondary | ICD-10-CM

## 2018-11-15 DIAGNOSIS — I10 Essential (primary) hypertension: Secondary | ICD-10-CM

## 2018-11-15 DIAGNOSIS — E785 Hyperlipidemia, unspecified: Secondary | ICD-10-CM

## 2018-11-15 DIAGNOSIS — R0789 Other chest pain: Secondary | ICD-10-CM | POA: Insufficient documentation

## 2018-11-15 DIAGNOSIS — I2510 Atherosclerotic heart disease of native coronary artery without angina pectoris: Secondary | ICD-10-CM

## 2018-11-15 MED ORDER — ISOSORBIDE MONONITRATE ER 30 MG PO TB24: 30.0000 mg | ORAL_TABLET | Freq: Every day | ORAL | 2 refills | Status: AC

## 2018-11-15 MED ORDER — GADOBUTROL 1 MMOL/ML IV SOLN
14.0000 mL | Freq: Once | INTRAVENOUS | Status: AC | PRN
  Administered 2018-11-15: 14 mL via INTRAVENOUS

## 2018-11-15 MED ORDER — LORAZEPAM 2 MG/ML IJ SOLN
INTRAMUSCULAR | Status: AC
  Administered 2018-11-15: 1 mg via INTRAVENOUS
  Filled 2018-11-15: qty 1

## 2018-11-15 NOTE — Patient Instructions (Addendum)
Medication Instructions:   START TAKING IMDUR (ISOSORBIDE MONONITRATE) 30 MG BY MOUTH DAILY  If you need a refill on your cardiac medications before your next appointment, please call your pharmacy.    Lab work:  AS PREVIOUSLY SCHEDULED FOR 11/25/2018 AT 8:00 AM  If you have labs (blood work) drawn today and your tests are completely normal, you will receive your results only by: Marland Kitchen MyChart Message (if you have MyChart) OR . A paper copy in the mail If you have any lab test that is abnormal or we need to change your treatment, we will call you to review the results.    Follow-Up:  KEEP YOUR LAB APPOINTMENT FOR November 25, 2018 AT 8 AM AS PREVIOUSLY SCHEDULED TO CHECK YOUR CHOLESTEROL LEVELS-COME FASTING TO THIS LAB APPOINTMENT, MEANING DO NOT EAT OR DRINK ANYTHING AFTER MIDNIGHT THE NIGHT BEFORE, BUT YOU MAY HAVE WATER WITH YOUR MEDS THE MORNING OF YOUR LAB APPOINTMENT.  KEEP YOUR SCHEDULED APPOINTMENT WITH OUR PHARMACIST IN LIPID CLINIC FOR 12/03/18 AT 9:30 AM  YOU WILL FOLLOW-UP WITH DR Meda Coffee IN 4 MONTHS AS A REGULAR OFFICE VISIT ON 03/26/19 AT 10:20 AM

## 2018-11-17 ENCOUNTER — Other Ambulatory Visit: Payer: Self-pay | Admitting: Family Medicine

## 2018-11-20 ENCOUNTER — Other Ambulatory Visit: Payer: Self-pay | Admitting: Family Medicine

## 2018-11-20 ENCOUNTER — Other Ambulatory Visit: Payer: Self-pay

## 2018-11-20 ENCOUNTER — Ambulatory Visit: Payer: 59 | Admitting: Family Medicine

## 2018-11-20 ENCOUNTER — Encounter: Payer: Self-pay | Admitting: Family Medicine

## 2018-11-20 ENCOUNTER — Telehealth: Payer: Self-pay | Admitting: Family Medicine

## 2018-11-20 VITALS — BP 122/84 | HR 101 | Temp 97.9°F | Ht 70.5 in | Wt 231.4 lb

## 2018-11-20 DIAGNOSIS — R319 Hematuria, unspecified: Secondary | ICD-10-CM | POA: Diagnosis not present

## 2018-11-20 DIAGNOSIS — R945 Abnormal results of liver function studies: Secondary | ICD-10-CM

## 2018-11-20 DIAGNOSIS — G4700 Insomnia, unspecified: Secondary | ICD-10-CM | POA: Diagnosis not present

## 2018-11-20 DIAGNOSIS — R7989 Other specified abnormal findings of blood chemistry: Secondary | ICD-10-CM

## 2018-11-20 DIAGNOSIS — N179 Acute kidney failure, unspecified: Secondary | ICD-10-CM

## 2018-11-20 DIAGNOSIS — R5383 Other fatigue: Secondary | ICD-10-CM

## 2018-11-20 DIAGNOSIS — K760 Fatty (change of) liver, not elsewhere classified: Secondary | ICD-10-CM

## 2018-11-20 DIAGNOSIS — I10 Essential (primary) hypertension: Secondary | ICD-10-CM

## 2018-11-20 DIAGNOSIS — E876 Hypokalemia: Secondary | ICD-10-CM

## 2018-11-20 LAB — CBC
HCT: 40.3 % (ref 39.0–52.0)
Hemoglobin: 13.5 g/dL (ref 13.0–17.0)
MCHC: 33.5 g/dL (ref 30.0–36.0)
MCV: 102.7 fl — ABNORMAL HIGH (ref 78.0–100.0)
Platelets: 128 10*3/uL — ABNORMAL LOW (ref 150.0–400.0)
RBC: 3.93 Mil/uL — ABNORMAL LOW (ref 4.22–5.81)
RDW: 17.2 % — ABNORMAL HIGH (ref 11.5–15.5)
WBC: 5.5 10*3/uL (ref 4.0–10.5)

## 2018-11-20 LAB — COMPREHENSIVE METABOLIC PANEL
ALT: 70 U/L — ABNORMAL HIGH (ref 0–53)
AST: 106 U/L — ABNORMAL HIGH (ref 0–37)
Albumin: 4.1 g/dL (ref 3.5–5.2)
Alkaline Phosphatase: 52 U/L (ref 39–117)
BUN: 10 mg/dL (ref 6–23)
CO2: 23 mEq/L (ref 19–32)
Calcium: 8.7 mg/dL (ref 8.4–10.5)
Chloride: 98 mEq/L (ref 96–112)
Creatinine, Ser: 1.19 mg/dL (ref 0.40–1.50)
GFR: 76.62 mL/min (ref >60.00)
Glucose, Bld: 100 mg/dL — ABNORMAL HIGH (ref 70–99)
Potassium: 3.2 mEq/L — ABNORMAL LOW (ref 3.5–5.1)
Sodium: 141 mEq/L (ref 135–145)
Total Bilirubin: 0.6 mg/dL (ref 0.2–1.2)
Total Protein: 7.2 g/dL (ref 6.0–8.3)

## 2018-11-20 LAB — URINALYSIS, MICROSCOPIC ONLY: RBC / HPF: NONE SEEN (ref 0–?)

## 2018-11-20 MED ORDER — DOXEPIN HCL 10 MG PO CAPS: 10.0000 mg | ORAL_CAPSULE | Freq: Every evening | ORAL | 2 refills | Status: DC | PRN

## 2018-11-20 NOTE — Patient Instructions (Signed)
Give us 2-3 business days to get the results of your labs back.   Keep the diet clean and stay active.  If you do not hear anything about your referral in the next 1-2 weeks, call our office and ask for an update.  Let us know if you need anything. 

## 2018-11-20 NOTE — Progress Notes (Signed)
Chief Complaint  Patient presents with  . Follow-up    Subjective [name] is a 57 y.o. male who presents for hypertension follow up. He does monitor home blood pressures. Blood pressures ranging from 120's/80's on average. He is compliant with medications- Norvasc 10 mg/d, losartan 100 mg/d. Patient has these side effects of medication: none He is usually adhering to a healthy diet overall. Current exercise: cardiac rehab  Insomnia persists. Goes to sleep around 10 PM, wakes up at 3 AM without being able to fall bac asleep. Melatonin was not helpful. Started on trazodone and that did not help. He does wake up w a dry mouth, neg sleep study 15 yrs ago. Will sometimes wake up like he has not slept at all. +fatigue.    Past Medical History:  Diagnosis Date  . BPH (benign prostatic hyperplasia)   . CAD (coronary artery disease)    a. NSTEMI troponin >65 with cath 11/2016 S/p PTCA & DES to mid circumflex. b. 05/2018 s/p DES to [facility].  Marland Kitchen Chronic chest pain   . Chronic combined systolic and diastolic CHF (congestive heart failure) ([place])    a. EF 45% in 2018. b. EF 55-60% by echo 01/2017. c. EF 48% by nuc 07/2017.  . CKD (chronic kidney disease), stage II   . Cluster headache    hx of  . Complication of anesthesia    difficult waking up"  . Diverticulosis   . Drug abuse ([place])    hx of  . GERD (gastroesophageal reflux disease)   . Hiatal hernia   . Hyperlipidemia   . Hypertension   . Hypothyroidism   . Nontoxic multinodular goiter   . NSTEMI (non-ST elevated myocardial infarction) ([place])   . Perforated nasal septum   . Pulmonary nodule --- CT 10/19/2014: Nodule is stable, no further routine x-rays 01/06/2009  . Thrombocytopenia ([place])   . TIA (transient ischemic attack)   . Trigeminal neuralgia   . Tubular adenoma of colon 12/2015  . Unspecified asthma(493.90)     Review of Systems Cardiovascular: no chest pain Respiratory:  no shortness of breath  Exam BP 122/84 (BP  Location: Left Arm, Patient Position: Sitting, Cuff Size: Normal)   Pulse (!) 101   Temp 97.9 F (36.6 C) (Oral)   Ht 5' 10.5" (1.791 m)   Wt 231 lb 6 oz (105 kg)   SpO2 94%   BMI 32.73 kg/m  General:  well developed, well nourished, in no apparent distress Heart: RRR, no bruits, no LE edema Lungs: clear to auscultation, no accessory muscle use Psych: well oriented with normal range of affect and appropriate judgment/insight  Essential hypertension - Plan: cont meds.   Insomnia, unspecified type - Plan: Ambulatory referral to Pulmonology, trial low dose Doxepin. No stronger TCA's if undx'd osa.   Fatigue, unspecified type - Plan: Ambulatory referral to Pulmonology  Hematuria, unspecified type - Plan: Urine Microscopic Only  AKI (acute kidney injury) ([place]) - Plan: Comprehensive metabolic panel, CBC  Elevated LFTs - Plan: Comprehensive metabolic panel  Fatty liver - Plan: Comprehensive metabolic panel  Counseled on diet and exercise. F/u in 6 mo for CPE or prn. The patient voiced understanding and agreement to the plan.  [place], DO 11/20/18  11:57 AM

## 2018-11-20 NOTE — Progress Notes (Unsigned)
bmp 
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
## 2018-11-21 ENCOUNTER — Telehealth: Payer: Self-pay | Admitting: *Deleted

## 2018-11-21 NOTE — Telephone Encounter (Signed)
Patient notified of results and appointment made.  Copied from [place] [phone]. Topic: Appointment Scheduling - Scheduling Inquiry for Clinic >> Nov 20, 2018  4:54 PM Valla Leaver wrote: Reason for CRM: Patient returning call for lab results and to get scheduled for additional labs this Friday per Dr. Serita Sheller request.

## 2018-11-22 ENCOUNTER — Other Ambulatory Visit: Payer: 59

## 2018-11-25 ENCOUNTER — Other Ambulatory Visit: Payer: 59

## 2018-12-02 ENCOUNTER — Encounter (HOSPITAL_BASED_OUTPATIENT_CLINIC_OR_DEPARTMENT_OTHER): Payer: Self-pay | Admitting: *Deleted

## 2018-12-02 ENCOUNTER — Inpatient Hospital Stay (HOSPITAL_COMMUNITY): Payer: 59

## 2018-12-02 ENCOUNTER — Inpatient Hospital Stay (HOSPITAL_BASED_OUTPATIENT_CLINIC_OR_DEPARTMENT_OTHER)
Admission: EM | Admit: 2018-12-02 | Discharge: 2018-12-11 | DRG: 418 | Disposition: A | Discharge status: Home / Self Care | Payer: 59 | Attending: Internal Medicine | Admitting: Internal Medicine

## 2018-12-02 ENCOUNTER — Emergency Department (HOSPITAL_BASED_OUTPATIENT_CLINIC_OR_DEPARTMENT_OTHER): Payer: 59

## 2018-12-02 ENCOUNTER — Other Ambulatory Visit: Payer: Self-pay

## 2018-12-02 DIAGNOSIS — Z79899 Other long term (current) drug therapy: Secondary | ICD-10-CM | POA: Diagnosis not present

## 2018-12-02 DIAGNOSIS — Z955 Presence of coronary angioplasty implant and graft: Secondary | ICD-10-CM

## 2018-12-02 DIAGNOSIS — K429 Umbilical hernia without obstruction or gangrene: Secondary | ICD-10-CM | POA: Diagnosis present

## 2018-12-02 DIAGNOSIS — Z7982 Long term (current) use of aspirin: Secondary | ICD-10-CM

## 2018-12-02 DIAGNOSIS — Z7951 Long term (current) use of inhaled steroids: Secondary | ICD-10-CM | POA: Diagnosis not present

## 2018-12-02 DIAGNOSIS — Z6831 Body mass index (BMI) 31.0-31.9, adult: Secondary | ICD-10-CM

## 2018-12-02 DIAGNOSIS — I252 Old myocardial infarction: Secondary | ICD-10-CM | POA: Diagnosis not present

## 2018-12-02 DIAGNOSIS — J45909 Unspecified asthma, uncomplicated: Secondary | ICD-10-CM | POA: Diagnosis present

## 2018-12-02 DIAGNOSIS — K851 Biliary acute pancreatitis without necrosis or infection: Secondary | ICD-10-CM | POA: Diagnosis present

## 2018-12-02 DIAGNOSIS — R112 Nausea with vomiting, unspecified: Secondary | ICD-10-CM | POA: Diagnosis not present

## 2018-12-02 DIAGNOSIS — N183 Chronic kidney disease, stage 3 (moderate): Secondary | ICD-10-CM | POA: Diagnosis present

## 2018-12-02 DIAGNOSIS — I10 Essential (primary) hypertension: Secondary | ICD-10-CM | POA: Diagnosis not present

## 2018-12-02 DIAGNOSIS — E876 Hypokalemia: Secondary | ICD-10-CM

## 2018-12-02 DIAGNOSIS — I130 Hypertensive heart and chronic kidney disease with heart failure and stage 1 through stage 4 chronic kidney disease, or unspecified chronic kidney disease: Secondary | ICD-10-CM | POA: Diagnosis present

## 2018-12-02 DIAGNOSIS — I2510 Atherosclerotic heart disease of native coronary artery without angina pectoris: Secondary | ICD-10-CM | POA: Diagnosis present

## 2018-12-02 DIAGNOSIS — R748 Abnormal levels of other serum enzymes: Secondary | ICD-10-CM | POA: Diagnosis present

## 2018-12-02 DIAGNOSIS — N210 Calculus in bladder: Secondary | ICD-10-CM | POA: Diagnosis present

## 2018-12-02 DIAGNOSIS — Z9049 Acquired absence of other specified parts of digestive tract: Secondary | ICD-10-CM

## 2018-12-02 DIAGNOSIS — E7800 Pure hypercholesterolemia, unspecified: Secondary | ICD-10-CM | POA: Diagnosis present

## 2018-12-02 DIAGNOSIS — E860 Dehydration: Secondary | ICD-10-CM | POA: Diagnosis present

## 2018-12-02 DIAGNOSIS — E039 Hypothyroidism, unspecified: Secondary | ICD-10-CM | POA: Diagnosis present

## 2018-12-02 DIAGNOSIS — K859 Acute pancreatitis without necrosis or infection, unspecified: Secondary | ICD-10-CM

## 2018-12-02 DIAGNOSIS — N179 Acute kidney failure, unspecified: Secondary | ICD-10-CM | POA: Diagnosis present

## 2018-12-02 DIAGNOSIS — K760 Fatty (change of) liver, not elsewhere classified: Secondary | ICD-10-CM | POA: Diagnosis present

## 2018-12-02 DIAGNOSIS — Z8673 Personal history of transient ischemic attack (TIA), and cerebral infarction without residual deficits: Secondary | ICD-10-CM

## 2018-12-02 DIAGNOSIS — Z20828 Contact with and (suspected) exposure to other viral communicable diseases: Secondary | ICD-10-CM | POA: Diagnosis present

## 2018-12-02 DIAGNOSIS — K8011 Calculus of gallbladder with chronic cholecystitis with obstruction: Secondary | ICD-10-CM | POA: Diagnosis present

## 2018-12-02 DIAGNOSIS — F329 Major depressive disorder, single episode, unspecified: Secondary | ICD-10-CM | POA: Diagnosis present

## 2018-12-02 DIAGNOSIS — Z7902 Long term (current) use of antithrombotics/antiplatelets: Secondary | ICD-10-CM

## 2018-12-02 DIAGNOSIS — Z01810 Encounter for preprocedural cardiovascular examination: Secondary | ICD-10-CM | POA: Diagnosis not present

## 2018-12-02 DIAGNOSIS — E785 Hyperlipidemia, unspecified: Secondary | ICD-10-CM | POA: Diagnosis present

## 2018-12-02 DIAGNOSIS — R140 Abdominal distension (gaseous): Secondary | ICD-10-CM

## 2018-12-02 DIAGNOSIS — R1110 Vomiting, unspecified: Secondary | ICD-10-CM | POA: Diagnosis present

## 2018-12-02 DIAGNOSIS — I5042 Chronic combined systolic (congestive) and diastolic (congestive) heart failure: Secondary | ICD-10-CM | POA: Diagnosis present

## 2018-12-02 DIAGNOSIS — D6959 Other secondary thrombocytopenia: Secondary | ICD-10-CM | POA: Diagnosis present

## 2018-12-02 DIAGNOSIS — R17 Unspecified jaundice: Secondary | ICD-10-CM

## 2018-12-02 DIAGNOSIS — E872 Acidosis, unspecified: Secondary | ICD-10-CM | POA: Diagnosis present

## 2018-12-02 DIAGNOSIS — N400 Enlarged prostate without lower urinary tract symptoms: Secondary | ICD-10-CM | POA: Diagnosis present

## 2018-12-02 DIAGNOSIS — R0602 Shortness of breath: Secondary | ICD-10-CM

## 2018-12-02 DIAGNOSIS — R945 Abnormal results of liver function studies: Secondary | ICD-10-CM | POA: Diagnosis not present

## 2018-12-02 DIAGNOSIS — K819 Cholecystitis, unspecified: Secondary | ICD-10-CM

## 2018-12-02 DIAGNOSIS — D696 Thrombocytopenia, unspecified: Secondary | ICD-10-CM | POA: Diagnosis not present

## 2018-12-02 DIAGNOSIS — K219 Gastro-esophageal reflux disease without esophagitis: Secondary | ICD-10-CM | POA: Diagnosis present

## 2018-12-02 DIAGNOSIS — Z87891 Personal history of nicotine dependence: Secondary | ICD-10-CM

## 2018-12-02 DIAGNOSIS — R7989 Other specified abnormal findings of blood chemistry: Secondary | ICD-10-CM | POA: Diagnosis present

## 2018-12-02 LAB — POCT I-STAT EG7
Acid-base deficit: 11 mmol/L — ABNORMAL HIGH (ref 0.0–2.0)
Bicarbonate: 11.5 mmol/L — ABNORMAL LOW (ref 20.0–28.0)
Calcium, Ion: 1.16 mmol/L (ref 1.15–1.40)
HCT: 38 % — ABNORMAL LOW (ref 39.0–52.0)
Hemoglobin: 12.9 g/dL — ABNORMAL LOW (ref 13.0–17.0)
O2 Saturation: 98 %
Patient temperature: 98
Potassium: 3.8 mmol/L (ref 3.5–5.1)
Sodium: 130 mmol/L — ABNORMAL LOW (ref 135–145)
TCO2: 12 mmol/L — ABNORMAL LOW (ref 22–32)
pCO2, Ven: 18.3 mmHg — CL (ref 44.0–60.0)
pH, Ven: 7.406 (ref 7.250–7.430)
pO2, Ven: 103 mmHg — ABNORMAL HIGH (ref 32.0–45.0)

## 2018-12-02 LAB — SARS CORONAVIRUS 2 BY RT PCR (HOSPITAL ORDER, PERFORMED IN ~~LOC~~ HOSPITAL LAB): SARS Coronavirus 2: NEGATIVE

## 2018-12-02 LAB — COMPREHENSIVE METABOLIC PANEL
ALT: 74 U/L — ABNORMAL HIGH (ref 0–44)
AST: 163 U/L — ABNORMAL HIGH (ref 15–41)
Albumin: 4.3 g/dL (ref 3.5–5.0)
Alkaline Phosphatase: 64 U/L (ref 38–126)
Anion gap: 31 — ABNORMAL HIGH (ref 5–15)
BUN: 22 mg/dL — ABNORMAL HIGH (ref 6–20)
CO2: 8 mmol/L — ABNORMAL LOW (ref 22–32)
Calcium: 9.7 mg/dL (ref 8.9–10.3)
Chloride: 94 mmol/L — ABNORMAL LOW (ref 98–111)
Creatinine, Ser: 1.5 mg/dL — ABNORMAL HIGH (ref 0.61–1.24)
GFR calc Af Amer: 60 mL/min — ABNORMAL LOW (ref >60)
GFR calc non Af Amer: 52 mL/min — ABNORMAL LOW (ref >60)
Glucose, Bld: 138 mg/dL — ABNORMAL HIGH (ref 70–99)
Potassium: 3.9 mmol/L (ref 3.5–5.1)
Sodium: 133 mmol/L — ABNORMAL LOW (ref 135–145)
Total Bilirubin: 5.1 mg/dL — ABNORMAL HIGH (ref 0.3–1.2)
Total Protein: 8.8 g/dL — ABNORMAL HIGH (ref 6.5–8.1)

## 2018-12-02 LAB — TROPONIN I (HIGH SENSITIVITY)
Troponin I (High Sensitivity): 48 ng/L — ABNORMAL HIGH (ref <18)
Troponin I (High Sensitivity): 44 ng/L — ABNORMAL HIGH (ref <18)

## 2018-12-02 LAB — BLOOD GAS, ARTERIAL
Acid-base deficit: 8 mmol/L — ABNORMAL HIGH (ref 0.0–2.0)
Bicarbonate: 16.1 mmol/L — ABNORMAL LOW (ref 20.0–28.0)
FIO2: 0.28
O2 Saturation: 97.7 %
Patient temperature: 98.6
pCO2 arterial: 27.7 mmHg — ABNORMAL LOW (ref 32.0–48.0)
pH, Arterial: 7.381 (ref 7.350–7.450)
pO2, Arterial: 117 mmHg — ABNORMAL HIGH (ref 83.0–108.0)

## 2018-12-02 LAB — CBC WITH DIFFERENTIAL/PLATELET
Abs Immature Granulocytes: 0.14 10*3/uL — ABNORMAL HIGH (ref 0.00–0.07)
Basophils Absolute: 0 10*3/uL (ref 0.0–0.1)
Basophils Relative: 0 %
Eosinophils Absolute: 0 10*3/uL (ref 0.0–0.5)
Eosinophils Relative: 0 %
HCT: 38.4 % — ABNORMAL LOW (ref 39.0–52.0)
Hemoglobin: 13.1 g/dL (ref 13.0–17.0)
Immature Granulocytes: 2 %
Lymphocytes Relative: 15 %
Lymphs Abs: 1 10*3/uL (ref 0.7–4.0)
MCH: 33.2 pg (ref 26.0–34.0)
MCHC: 34.1 g/dL (ref 30.0–36.0)
MCV: 97.5 fL (ref 80.0–100.0)
Monocytes Absolute: 0.8 10*3/uL (ref 0.1–1.0)
Monocytes Relative: 12 %
Neutro Abs: 4.6 10*3/uL (ref 1.7–7.7)
Neutrophils Relative %: 71 %
Platelets: 43 10*3/uL — ABNORMAL LOW (ref 150–400)
RBC: 3.94 MIL/uL — ABNORMAL LOW (ref 4.22–5.81)
RDW: 15.3 % (ref 11.5–15.5)
Smear Review: DECREASED
WBC: 6.4 10*3/uL (ref 4.0–10.5)
nRBC: 0.5 % — ABNORMAL HIGH (ref 0.0–0.2)

## 2018-12-02 LAB — BASIC METABOLIC PANEL
Anion gap: 23 — ABNORMAL HIGH (ref 5–15)
BUN: 22 mg/dL — ABNORMAL HIGH (ref 6–20)
CO2: 15 mmol/L — ABNORMAL LOW (ref 22–32)
Calcium: 9.6 mg/dL (ref 8.9–10.3)
Chloride: 97 mmol/L — ABNORMAL LOW (ref 98–111)
Creatinine, Ser: 1.41 mg/dL — ABNORMAL HIGH (ref 0.61–1.24)
GFR calc Af Amer: >60 mL/min (ref >60)
GFR calc non Af Amer: 56 mL/min — ABNORMAL LOW (ref >60)
Glucose, Bld: 143 mg/dL — ABNORMAL HIGH (ref 70–99)
Potassium: 3.8 mmol/L (ref 3.5–5.1)
Sodium: 135 mmol/L (ref 135–145)

## 2018-12-02 LAB — ETHANOL: Alcohol, Ethyl (B): <10 mg/dL (ref <10)

## 2018-12-02 LAB — LACTIC ACID, PLASMA: Lactic Acid, Venous: 1.8 mmol/L (ref 0.5–1.9)

## 2018-12-02 LAB — BRAIN NATRIURETIC PEPTIDE: B Natriuretic Peptide: 47 pg/mL (ref 0.0–100.0)

## 2018-12-02 LAB — SALICYLATE LEVEL: Salicylate Lvl: <7 mg/dL (ref 2.8–30.0)

## 2018-12-02 LAB — CBG MONITORING, ED: Glucose-Capillary: 135 mg/dL — ABNORMAL HIGH (ref 70–99)

## 2018-12-02 LAB — ACETAMINOPHEN LEVEL: Acetaminophen (Tylenol), Serum: <10 ug/mL — ABNORMAL LOW (ref 10–30)

## 2018-12-02 LAB — LIPASE, BLOOD: Lipase: 346 U/L — ABNORMAL HIGH (ref 11–51)

## 2018-12-02 LAB — MAGNESIUM: Magnesium: 1.6 mg/dL — ABNORMAL LOW (ref 1.7–2.4)

## 2018-12-02 MED ORDER — ONDANSETRON HCL 4 MG/2ML IJ SOLN
INTRAMUSCULAR | Status: AC
  Filled 2018-12-02: qty 2

## 2018-12-02 MED ORDER — PROMETHAZINE HCL 25 MG/ML IJ SOLN
12.5000 mg | Freq: Four times a day (QID) | INTRAMUSCULAR | Status: DC | PRN
  Administered 2018-12-02 – 2018-12-03 (×2): 12.5 mg via INTRAVENOUS
  Filled 2018-12-02: qty 1

## 2018-12-02 MED ORDER — MAGNESIUM SULFATE IN D5W 1-5 GM/100ML-% IV SOLN
1.0000 g | Freq: Once | INTRAVENOUS | Status: AC
  Administered 2018-12-03: 1 g via INTRAVENOUS
  Filled 2018-12-02: qty 100

## 2018-12-02 MED ORDER — IOHEXOL 300 MG/ML  SOLN
100.0000 mL | Freq: Once | INTRAMUSCULAR | Status: AC | PRN
  Administered 2018-12-02: 100 mL via INTRAVENOUS

## 2018-12-02 MED ORDER — PROMETHAZINE HCL 25 MG/ML IJ SOLN
INTRAMUSCULAR | Status: AC
  Administered 2018-12-02: 12.5 mg via INTRAVENOUS
  Filled 2018-12-02: qty 1

## 2018-12-02 MED ORDER — SODIUM CHLORIDE 0.9 % IV BOLUS
500.0000 mL | Freq: Once | INTRAVENOUS | Status: AC
  Administered 2018-12-02: 500 mL via INTRAVENOUS

## 2018-12-02 MED ORDER — ONDANSETRON HCL 4 MG/2ML IJ SOLN
4.0000 mg | Freq: Once | INTRAMUSCULAR | Status: AC
  Administered 2018-12-02: 4 mg via INTRAVENOUS
  Filled 2018-12-02: qty 2

## 2018-12-02 MED ORDER — SODIUM CHLORIDE 0.9 % IV SOLN
INTRAVENOUS | Status: AC
  Administered 2018-12-02 – 2018-12-03 (×2): via INTRAVENOUS

## 2018-12-02 MED ORDER — MORPHINE SULFATE (PF) 4 MG/ML IV SOLN
4.0000 mg | Freq: Once | INTRAVENOUS | Status: AC
  Administered 2018-12-02: 4 mg via INTRAVENOUS
  Filled 2018-12-02: qty 1

## 2018-12-02 MED ORDER — HYDROMORPHONE HCL 1 MG/ML IJ SOLN
1.0000 mg | Freq: Once | INTRAMUSCULAR | Status: AC
  Administered 2018-12-02: 1 mg via INTRAVENOUS
  Filled 2018-12-02: qty 1

## 2018-12-02 MED ORDER — MORPHINE SULFATE (PF) 2 MG/ML IV SOLN
1.0000 mg | INTRAVENOUS | Status: DC | PRN
  Filled 2018-12-02: qty 1

## 2018-12-02 MED ORDER — ONDANSETRON HCL 4 MG/2ML IJ SOLN
4.0000 mg | Freq: Once | INTRAMUSCULAR | Status: AC
  Administered 2018-12-02: 4 mg via INTRAVENOUS

## 2018-12-02 NOTE — ED Provider Notes (Signed)
[facility] EMERGENCY DEPARTMENT Provider Note   CSN: [id number] Arrival date & time: 12/02/18  1108     History   Chief Complaint Chief Complaint  Patient presents with   Shortness of Breath   Emesis   Chest Pain    HPI [name] is a 58 y.o. male.     Patient is a 58 year old male with extensive past medical history including coronary artery disease with stent, CHF, cardiomyopathy, hypertension, TIA, CRI.  He presents today with complaints of a 2-day history of nausea, vomiting, and upper abdominal pain.  Patient states he has not been able to keep anything down for the past 2 days.  He feels dehydrated and short of breath.  He denies any fevers or chills.  Patient does have history of known gallstones, however no surgery has been arranged.  The history is provided by the patient.  Abdominal Pain Pain location:  Epigastric Pain quality: cramping   Pain radiates to:  Does not radiate Pain severity:  Severe Onset quality:  Sudden Duration:  2 days Timing:  Constant Progression:  Worsening Chronicity:  New Relieved by:  Nothing Worsened by:  Nothing Ineffective treatments:  None tried   Past Medical History:  Diagnosis Date   BPH (benign prostatic hyperplasia)    CAD (coronary artery disease)    a. NSTEMI troponin >65 with cath 11/2016 S/p PTCA & DES to mid circumflex. b. 05/2018 s/p DES to [facility].   Chronic chest pain    Chronic combined systolic and diastolic CHF (congestive heart failure) ([place])    a. EF 45% in 2018. b. EF 55-60% by echo 01/2017. c. EF 48% by nuc 07/2017.   CKD (chronic kidney disease), stage II    Cluster headache    hx of   Complication of anesthesia    difficult waking up"   Diverticulosis    Drug abuse ([place])    hx of   GERD (gastroesophageal reflux disease)    Hiatal hernia    Hyperlipidemia    Hypertension    Hypothyroidism    Nontoxic multinodular goiter    NSTEMI (non-ST elevated myocardial  infarction) ([place])    Perforated nasal septum    Pulmonary nodule --- CT 10/19/2014: Nodule is stable, no further routine x-rays 01/06/2009   Thrombocytopenia (HCC)    TIA (transient ischemic attack)    Trigeminal neuralgia    Tubular adenoma of colon 12/2015   Unspecified asthma(493.90)     Patient Active Problem List   Diagnosis Date Noted   Insomnia 11/20/2018   Fatty liver 11/06/2018   Dehydration 10/29/2018   Nausea & vomiting 10/29/2018   Elevated LFTs 10/29/2018   Lactic acidosis 07/23/2018   Status post coronary artery stent placement    History of coronary artery disease    Unstable angina ([place]) 06/08/2018   S/P angioplasty with stent    Chronic heart failure with preserved ejection fraction (HFpEF) ([place])    Situational depression 06/07/2018   Palpitations 05/28/2018   Chronic cough 05/27/2018   Gross hematuria 05/27/2018   Chest pain 07/22/2017   Ischemic cardiomyopathy 02/05/2017   Obesity 12/08/2016   Chronic diastolic CHF (congestive heart failure) ([place]) 12/08/2016   Atypical chest pain 12/07/2016   NSTEMI (non-ST elevated myocardial infarction) ([place]) 11/26/2016   Intractable nausea and vomiting 10/09/2016   Nausea vomiting and diarrhea 10/09/2016   Enteritis due to Clostridium difficile 10/30/2015   Malnutrition of moderate degree 10/21/2015   Acute kidney injury ([place]) 10/20/2015   PCP  NOTES >>>>>>>>>>>>>>>>> 09/30/2015   Hypercholesteremia 09/22/2015   Renal mass, left 11/02/2014   CAD (coronary artery disease)    Drug abuse and dependence ([place]) 09/18/2014   Visit for preventive health examination 02/04/2014   Screening for ischemic heart disease 02/04/2014   BPH associated with nocturia 12/24/2013   Hypothyroidism 12/24/2013   Chronic ethmoidal sinusitis 12/24/2013   Low back pain radiating to right leg 05/10/2012   Thrombocytopenia ([place]) 12/05/2011   Tobacco use 11/30/2011   Anxiety 12/26/2010    Transient cerebral ischemia 01/07/2010   Asthma 01/19/2009   Pulmonary nodule --- CT 10/19/2014: Nodule is stable, no further routine x-rays 01/06/2009   Nontoxic multinodular goiter 12/31/2008   DYSPNEA ON EXERTION 12/15/2008   Trigeminal neuralgia 07/24/2007   Dyslipidemia 12/27/2006   Cluster headache 12/27/2006   Essential hypertension 12/27/2006   GERD 12/27/2006    Past Surgical History:  Procedure Laterality Date   APPENDECTOMY     CARDIAC CATHETERIZATION N/A 11/25/2014   Procedure: Left Heart Cath and Coronary Angiography;  Surgeon: Belva Crome, MD;  Location: [facility] LAB;  Service: Cardiovascular;  Laterality: N/A;   CARDIAC CATHETERIZATION  12/08/2008   Archie Endo 08/24/2010   COLECTOMY  ~ 1976   "I had a blockage"   CORONARY STENT INTERVENTION N/A 11/27/2016   Procedure: CORONARY STENT INTERVENTION;  Surgeon: Belva Crome, MD;  Location: [facility];  Service: Cardiovascular;  Laterality: N/A;   CORONARY STENT INTERVENTION N/A 06/10/2018   Procedure: CORONARY STENT INTERVENTION;  Surgeon: Nelva Bush, MD;  Location: [facility];  Service: Cardiovascular;  Laterality: N/A;   CORONARY STENT PLACEMENT  11/27/2016   STENT XIENCE ALPINE RX 3.0X15 drug eluting stent was successfully placed   CYST EXCISION Left 10/31/2016   Jaw   DESTRUCTION TRIGEMINAL NERVE VIA NEUROLYTIC AGENT  x 3 , R side last ~2010   KNEE ARTHROSCOPY Right 06/26/2000   Arthroscopy followed by open lateral release/notes 09/06/2010   LEFT HEART CATH AND CORONARY ANGIOGRAPHY N/A 11/27/2016   Procedure: LEFT HEART CATH AND CORONARY ANGIOGRAPHY;  Surgeon: Belva Crome, MD;  Location: [facility];  Service: Cardiovascular;  Laterality: N/A;   LEFT HEART CATH AND CORONARY ANGIOGRAPHY N/A 12/07/2016   Procedure: LEFT HEART CATH AND CORONARY ANGIOGRAPHY;  Surgeon: Nelva Bush, MD;  Location: [facility];  Service: Cardiovascular;  Laterality: N/A;   LEFT  HEART CATH AND CORONARY ANGIOGRAPHY N/A 06/10/2018   Procedure: LEFT HEART CATH AND CORONARY ANGIOGRAPHY;  Surgeon: Nelva Bush, MD;  Location: [facility];  Service: Cardiovascular;  Laterality: N/A;   LEFT HEART CATH AND CORONARY ANGIOGRAPHY N/A 07/24/2018   Procedure: LEFT HEART CATH AND CORONARY ANGIOGRAPHY;  Surgeon: Belva Crome, MD;  Location: [facility];  Service: Cardiovascular;  Laterality: N/A;   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 Ardine Bjork 09/06/2010   SHOULDER HEMI-ARTHROPLASTY Right 05/26/2010   revision/notes 05/27/2010        Home Medications    Prior to Admission medications   Medication Sig Start Date End Date Taking? Authorizing Provider  amLODipine (NORVASC) 10 MG tablet TAKE ONE TABLET BY MOUTH DAILY 11/15/18   [name], DO  aspirin EC 81 MG tablet Take 81 mg by mouth daily.    [provider]  calcium carbonate (TUMS EX) 750 MG chewable tablet Chew 2 tablets by mouth as needed for heartburn.    [provider]  clopidogrel (PLAVIX) 75 MG  tablet Take 75 mg by mouth daily.    [provider]  doxepin (SINEQUAN) 10 MG capsule Take 1 capsule (10 mg total) by mouth at bedtime as needed (Sleep). 11/20/18   Wendling, Crosby Oyster, DO  Evolocumab (REPATHA SURECLICK) 268 MG/ML SOAJ Inject 140 mg into the skin every 14 (fourteen) days. 09/12/18   Dorothy Spark, MD  ezetimibe (ZETIA) 10 MG tablet Take 1 tablet (10 mg total) by mouth daily. 05/27/18   [name], DO  FLUoxetine (PROZAC) 20 MG tablet Take 20 mg by mouth daily.    [provider]  fluticasone (FLONASE) 50 MCG/ACT nasal spray Place 2 sprays into both nostrils daily as needed for allergies or rhinitis.    [provider]  isosorbide mononitrate (IMDUR) 30 MG 24 hr tablet Take 1 tablet (30 mg total) by mouth daily. 11/15/18   Dorothy Spark, MD  lansoprazole (PREVACID) 30 MG capsule TAKE ONE CAPSULE  BY MOUTH TWICE A DAY BEFORE A MEAL 11/18/18   Wendling, Crosby Oyster, DO  levocetirizine (XYZAL) 5 MG tablet Take 1 tablet (5 mg total) by mouth every evening. 05/27/18   [name], DO  losartan (COZAAR) 100 MG tablet Take 100 mg by mouth daily.    [provider]  metoprolol succinate (TOPROL-XL) 50 MG 24 hr tablet Take 1 tablet (50 mg total) by mouth daily. Take with or immediately following a meal. 10/09/18   Wendling, Crosby Oyster, DO  nitroGLYCERIN (NITROSTAT) 0.4 MG SL tablet Place 1 tablet (0.4 mg total) under the tongue every 5 (five) minutes as needed for chest pain. 05/28/18   Imogene Burn, PA-C  ondansetron (ZOFRAN) 4 MG tablet Take 1 tablet (4 mg total) by mouth every 6 (six) hours as needed for nausea. 11/01/18   Mikhail, Velta Addison, DO  Polyethyl Glycol-Propyl Glycol (SYSTANE OP) Place 1 drop into both eyes every 3 (three) hours as needed (dry eyes).     [provider]  promethazine (PHENERGAN) 12.5 MG tablet Take 1 tablet (12.5 mg total) by mouth every 6 (six) hours as needed for refractory nausea / vomiting. 11/01/18   Cristal Ford, DO    Family History Family History  Problem Relation Age of Onset   Diabetes Mother    Hyperlipidemia Mother    Hypertension Mother    Hypertension Father    Heart disease Father    Cancer Father    Sudden death Father        OCT August 14, 2009   CAD Father    Heart failure Father    Mesothelioma Father    Heart disease Maternal Aunt    Prostate cancer Neg Hx    Colon cancer Neg Hx     Social History Social History   Tobacco Use   Smoking status: Former Smoker    Packs/day: 0.25    Years: 12.00    Pack years: 3.00    Types: Cigarettes    Quit date: 11/15/2016    Years since quitting: 2.0   Smokeless tobacco: Never Used  Substance Use Topics   Alcohol use: Yes    Alcohol/week: 1.0 standard drinks    Types: 1 Shots of liquor per week    Comment: occ   Drug use: No     Allergies     Tramadol   Review of Systems Review of Systems  Gastrointestinal: Positive for abdominal pain.  All other systems reviewed and are negative.    Physical Exam Updated Vital Signs BP Marland Kitchen)  145/116    Pulse (!) 129    Temp 97.7 F (36.5 C) (Oral)    Resp (!) 26    Ht 5\' 10"  (1.778 m)    Wt 99.8 kg    SpO2 100%    BMI 31.57 kg/m   Physical Exam Vitals signs and nursing note reviewed.  Constitutional:      General: He is not in acute distress.    Appearance: He is well-developed. He is not diaphoretic.  HENT:     Head: Normocephalic and atraumatic.  Neck:     Musculoskeletal: Normal range of motion and neck supple.  Cardiovascular:     Rate and Rhythm: Normal rate and regular rhythm.     Heart sounds: No murmur. No friction rub.  Pulmonary:     Effort: Pulmonary effort is normal. Tachypnea present. No respiratory distress.     Breath sounds: Normal breath sounds. No wheezing or rales.  Abdominal:     General: Bowel sounds are normal. There is no distension.     Palpations: Abdomen is soft.     Tenderness: There is abdominal tenderness. There is no guarding or rebound.  Musculoskeletal: Normal range of motion.     Right lower leg: He exhibits no tenderness. No edema.     Left lower leg: He exhibits no tenderness. No edema.  Skin:    General: Skin is warm and dry.  Neurological:     Mental Status: He is alert and oriented to person, place, and time.     Coordination: Coordination normal.      ED Treatments / Results  Labs (all labs ordered are listed, but only abnormal results are displayed) Labs Reviewed  CBC WITH DIFFERENTIAL/PLATELET - Abnormal; Notable for the following components:      Result Value   RBC 3.94 (*)    HCT 38.4 (*)    Platelets 43 (*)    nRBC 0.5 (*)    Abs Immature Granulocytes 0.14 (*)    All other components within normal limits  COMPREHENSIVE METABOLIC PANEL - Abnormal; Notable for the following components:   Sodium 133 (*)    Chloride 94  (*)    CO2 8 (*)    Glucose, Bld 138 (*)    BUN 22 (*)    Creatinine, Ser 1.50 (*)    Total Protein 8.8 (*)    AST 163 (*)    ALT 74 (*)    Total Bilirubin 5.1 (*)    GFR calc non Af Amer 52 (*)    GFR calc Af Amer 60 (*)    Anion gap 31 (*)    All other components within normal limits  LIPASE, BLOOD - Abnormal; Notable for the following components:   Lipase 346 (*)    All other components within normal limits  CBG MONITORING, ED - Abnormal; Notable for the following components:   Glucose-Capillary 135 (*)    All other components within normal limits  TROPONIN I (HIGH SENSITIVITY) - Abnormal; Notable for the following components:   Troponin I (High Sensitivity) 48 (*)    All other components within normal limits  SARS CORONAVIRUS 2  BRAIN NATRIURETIC PEPTIDE  TROPONIN I (HIGH SENSITIVITY)    EKG EKG Interpretation  Date/Time:  Monday December 02 2018 11:22:49 EDT Ventricular Rate:  122 PR Interval:    QRS Duration: 79 QT Interval:  354 QTC Calculation: 505 R Axis:   82 Text Interpretation:  Sinus tachycardia Probable left atrial enlargement Borderline repolarization abnormality  Confirmed by Veryl Speak [phone]) on 12/02/2018 11:41:20 AM   Radiology Ct Abdomen Pelvis W Contrast  Result Date: 12/02/2018 CLINICAL DATA:  Pain. Acute. Generalized. Personal history tubular adenoma the colon. EXAM: CT ABDOMEN AND PELVIS WITH CONTRAST TECHNIQUE: Multidetector CT imaging of the abdomen and pelvis was performed using the standard protocol following bolus administration of intravenous contrast. CONTRAST:  130mL OMNIPAQUE IOHEXOL 300 MG/ML  SOLN COMPARISON:  CT of the abdomen and pelvis 10/29/2018 FINDINGS: Lower chest: The lung bases are clear without focal nodule, mass, or airspace disease. Heart size is normal. Coronary artery calcifications are again noted. No significant pleural or pericardial effusion is present. Hepatobiliary: There is diffuse fatty infiltration of the liver.  Homogeneous hyperdense material is present in the gallbladder. Common bile duct is normal. No focal hepatic lesions are present. Pancreas: Unremarkable. No pancreatic ductal dilatation or surrounding inflammatory changes. Spleen: Normal in size without focal abnormality. Adrenals/Urinary Tract: Adrenal glands are normal bilaterally. Exophytic cyst of the left kidney are again noted. There is no stone or mass lesion. Ureters are within normal limits. Posterior bladder stone the left is stable. Is fat in the bladder wall anteriorly, stable. No other discrete lesions are present. Stomach/Bowel: Stomach and duodenum are within normal limits. Small bowel is unremarkable. Terminal ileum is within normal limits. Appendectomy is noted. Ascending and proximal transverse colon are mostly collapsed. Transverse and descending colon are within normal limits. Sigmoid colon is unremarkable. Vascular/Lymphatic: Atherosclerotic calcifications are present in the aorta without aneurysm. No significant adenopathy is present. Reproductive: Prostate is mildly enlarged. No discrete lesions are present. Other: No abdominal wall hernia or abnormality. No abdominopelvic ascites. Musculoskeletal: Mild degenerative changes are noted in the lower lumbar spine. Degenerative changes are noted at the SI joints. Hips are within normal limits. IMPRESSION: 1. No acute or focal lesion to explain the patient's abdominal pain. 2. Extensive hepatic steatosis. 3.  Aortic Atherosclerosis (ICD10-I70.0). 4. Coronary artery disease. 5. Degenerative changes in the lower lumbar spine. Electronically Signed   By: San Morelle M.D.   On: 12/02/2018 13:27    Procedures Procedures (including critical care time)  Medications Ordered in ED Medications  ondansetron (ZOFRAN) injection 4 mg (4 mg Intravenous Given 12/02/18 1134)  sodium chloride 0.9 % bolus 500 mL (0 mLs Intravenous Stopped 12/02/18 1356)  iohexol (OMNIPAQUE) 300 MG/ML solution 100 mL  (100 mLs Intravenous Contrast Given 12/02/18 1305)  morphine 4 MG/ML injection 4 mg (4 mg Intravenous Given 12/02/18 1333)  promethazine (PHENERGAN) 25 MG/ML injection (12.5 mg  Given 12/02/18 1333)     Initial Impression / Assessment and Plan / ED Course  I have reviewed the triage vital signs and the nursing notes.  Pertinent labs & imaging results that were available during my care of the patient were reviewed by me and considered in my medical decision making (see chart for details).  Patient is a 58 year old male with extensive past medical history including congestive heart failure, coronary artery disease.  He presents today with complaints of abdominal pain and vomiting.  This is been persistent for the past 2 days and he feels dehydrated.  Patient is tender in the epigastric region and work-up shows an elevated lipase of 350.  There is no CT evidence of pancreatitis, however I have found nothing else to explain his pain.  His cardiac work-up is thus far unremarkable.  Patient does have an acidosis with CO2 of 8, the etiology of which I am uncertain.  Patient hydrated and will be  admitted to the hospitalist service under the care of Dr. Lorin Mercy.  Final Clinical Impressions(s) / ED Diagnoses   Final diagnoses:  None    ED Discharge Orders    None       Veryl Speak, MD 12/02/18 1456

## 2018-12-02 NOTE — ED Notes (Signed)
Pt instructed to attempt to slow breathing down.

## 2018-12-02 NOTE — ED Notes (Signed)
Pt requesting pain medication at this time. MD made aware.

## 2018-12-02 NOTE — ED Notes (Signed)
ED Provider at bedside. 

## 2018-12-02 NOTE — Progress Notes (Addendum)
Pt arrived by [facility], via stretcher; admitted to %W11. \Drowsy, but easily arousable. Pt answers questions appropriately.Oriented X4 MAE well. Pt has mild SOB on exertion. Placed on step down monitor,VS taken, placed on , and oriented to room. Hospitalist is aware that pt is here.

## 2018-12-02 NOTE — ED Triage Notes (Signed)
Sob. Vomiting x 2 days.

## 2018-12-02 NOTE — Progress Notes (Addendum)
Called from [facility] regarding this patient with h/o asthma; TIA; CAD with NSTEMI 8/18 and 2/20 with stent placements); multinodular goiter with hypothyroidism; HTN; HLD; h/o drug abuse; chronic combined CHF; and stage 2 CKD presenting with SOB and vomiting.   Unable to keep anything down for 2 days, known gallstones. CT unremarkable. EKG ok, troponin ok.  Lipase at 350.  CO2 is 8, but not in DKA - uncertain acidosis.  ?COVID - not yet swabbed.  LFTs up a bit, ?pancreatitis.  Bilirubin is 5.1.  Will check ABG, appears to be hyperventilating.  Will admit to progressive for now since the patient appears to be quite ill with uncertain etiology.  Likely needs RUQ [place].  Will be PUI at this time.  Carlyon Shadow, M.D.   Please call the Patient Placement RN of [facility] at [phone] when the patient arrives to floor.

## 2018-12-02 NOTE — H&P (Signed)
History and Physical    [name] BWG:[id number] DOB: Dec 22, 1960 DOA: 12/02/2018  PCP: [name], DO Patient coming from: [facility]  Chief Complaint: Emesis  HPI: [name] is a 58 y.o. male with medical history significant of asthma, TIA, thrombocytopenia, CAD, hypertension, hyperlipidemia, hypothyroidism, GERD, diverticulosis, CKD stage II, chronic combined systolic and diastolic congestive heart failure, BPH presenting to the hospital for evaluation of emesis. History limited as patient was somnolent.  Patient reports 2-day history of vomiting.  States he has not been able to keep any food down.  He is also having intermittent periumbilical abdominal pain.  No diarrhea.  He has also been feeling short of breath for the past 2 days.  States he had chest pain 3 weeks ago.  Denies any chest pain at present.  Denies any fevers or chills.  States he takes 1 aspirin daily.  Denies acetaminophen use.  States he drinks alcohol only occasionally.  No additional history could be obtained from him.  ED Course: Afebrile.  Persistently tachycardic with heart rate in the 110s to 120s.  Tachypneic.  Not hypotensive.  SPO2 99-100%.  Placed on 2 L supplemental oxygen for comfort.  No leukocytosis.  Hemoglobin 13.1.  Platelet count 43,000.  Patient has chronic thrombocytopenia and platelet count was 128,000 on 7/29.  Bicarb 8.  Blood glucose 138.  Anion gap 31.  BUN 22, creatinine 1.5.  Creatinine was 1.1 on 7/29.  Lipase 346.  AST 163, ALT 74, and T bili 5.1.  Alk phos normal.  AST and ALT have previously been elevated as well but T bili was previously normal.  High-sensitivity troponin 48 >44.  EKG without acute ischemic changes.  BNP normal.  VBG with pH 7.40.  COVID-19 rapid test negative.  CT abdomen pelvis with contrast showing no acute or focal lesion to explain the patient's abdominal pain.  Extensive hepatic steatosis.  Homogenous hyperdense material present in the gallbladder.  Common  bile duct is normal.  Pancreas unremarkable.  No pancreatic ductal dilatation or surrounding inflammatory changes. Patient received a dose of Dilaudid, morphine, and Zofran.  Also received a 500 cc IV fluid bolus.  Review of Systems:  All systems reviewed and apart from history of presenting illness, are negative.  Past Medical History:  Diagnosis Date   BPH (benign prostatic hyperplasia)    CAD (coronary artery disease)    a. NSTEMI troponin >65 with cath 11/2016 S/p PTCA & DES to mid circumflex. b. 05/2018 s/p DES to [facility].   Chronic chest pain    Chronic combined systolic and diastolic CHF (congestive heart failure) ([place])    a. EF 45% in 2018. b. EF 55-60% by echo 01/2017. c. EF 48% by nuc 07/2017.   CKD (chronic kidney disease), stage II    Cluster headache    hx of   Complication of anesthesia    difficult waking up"   Diverticulosis    Drug abuse ([place])    hx of   GERD (gastroesophageal reflux disease)    Hiatal hernia    Hyperlipidemia    Hypertension    Hypothyroidism    Nontoxic multinodular goiter    NSTEMI (non-ST elevated myocardial infarction) ([place])    Perforated nasal septum    Pulmonary nodule --- CT 10/19/2014: Nodule is stable, no further routine x-rays 01/06/2009   Thrombocytopenia (HCC)    TIA (transient ischemic attack)    Trigeminal neuralgia    Tubular adenoma of colon 12/2015   Unspecified asthma(493.90)  Past Surgical History:  Procedure Laterality Date   APPENDECTOMY     CARDIAC CATHETERIZATION N/A 11/25/2014   Procedure: Left Heart Cath and Coronary Angiography;  Surgeon: Belva Crome, MD;  Location: [place] [facility];  Service: Cardiovascular;  Laterality: N/A;   CARDIAC CATHETERIZATION  12/08/2008   Archie Endo 08/24/2010   COLECTOMY  ~ 06/17/1974   "I had a blockage"   CORONARY STENT INTERVENTION N/A 11/27/2016   Procedure: CORONARY STENT INTERVENTION;  Surgeon: Belva Crome, MD;  Location: [place] [facility];  Service:  Cardiovascular;  Laterality: N/A;   CORONARY STENT INTERVENTION N/A 06/10/2018   Procedure: CORONARY STENT INTERVENTION;  Surgeon: Nelva Bush, MD;  Location: [place] [facility];  Service: Cardiovascular;  Laterality: N/A;   CORONARY STENT PLACEMENT  11/27/2016   STENT XIENCE ALPINE RX 3.0X15 drug eluting stent was successfully placed   CYST EXCISION Left 10/31/2016   Jaw   DESTRUCTION TRIGEMINAL NERVE VIA NEUROLYTIC AGENT  x 3 , R side last ~2010   KNEE ARTHROSCOPY Right 06/26/2000   Arthroscopy followed by open lateral release/notes 09/06/2010   LEFT HEART CATH AND CORONARY ANGIOGRAPHY N/A 11/27/2016   Procedure: LEFT HEART CATH AND CORONARY ANGIOGRAPHY;  Surgeon: Belva Crome, MD;  Location: [place] [facility];  Service: Cardiovascular;  Laterality: N/A;   LEFT HEART CATH AND CORONARY ANGIOGRAPHY N/A 12/07/2016   Procedure: LEFT HEART CATH AND CORONARY ANGIOGRAPHY;  Surgeon: Nelva Bush, MD;  Location: [place] [facility];  Service: Cardiovascular;  Laterality: N/A;   LEFT HEART CATH AND CORONARY ANGIOGRAPHY N/A 06/10/2018   Procedure: LEFT HEART CATH AND CORONARY ANGIOGRAPHY;  Surgeon: Nelva Bush, MD;  Location: [place] [facility];  Service: Cardiovascular;  Laterality: N/A;   LEFT HEART CATH AND CORONARY ANGIOGRAPHY N/A 07/24/2018   Procedure: LEFT HEART CATH AND CORONARY ANGIOGRAPHY;  Surgeon: Belva Crome, MD;  Location: [place] [facility];  Service: Cardiovascular;  Laterality: N/A;   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 Ardine Bjork 09/06/2010   SHOULDER HEMI-ARTHROPLASTY Right 05/26/2010   revision/notes 05/27/2010     reports that he quit smoking about 2 years ago. His smoking use included cigarettes. He has a 3.00 pack-year smoking history. He has never used smokeless tobacco. He reports current alcohol use of about 1.0 standard drinks of alcohol per week. He reports that he does not use drugs.  Allergies  Allergen Reactions   Tramadol  Palpitations    Family History  Problem Relation Age of Onset   Diabetes Mother    Hyperlipidemia Mother    Hypertension Mother    Hypertension Father    Heart disease Father    Cancer Father    Sudden death Father        OCT 2009/06/17   CAD Father    Heart failure Father    Mesothelioma Father    Heart disease Maternal Aunt    Prostate cancer Neg Hx    Colon cancer Neg Hx     Prior to Admission medications   Medication Sig Start Date End Date Taking? Authorizing Provider  amLODipine (NORVASC) 10 MG tablet TAKE ONE TABLET BY MOUTH DAILY 11/15/18   [name], DO  aspirin EC 81 MG tablet Take 81 mg by mouth daily.    [provider]  calcium carbonate (TUMS EX) 750 MG chewable tablet Chew 2 tablets by mouth as needed for heartburn.    [provider]  clopidogrel (PLAVIX) 75 MG tablet Take  75 mg by mouth daily.    [provider]  doxepin (SINEQUAN) 10 MG capsule Take 1 capsule (10 mg total) by mouth at bedtime as needed (Sleep). 11/20/18   Wendling, Crosby Oyster, DO  Evolocumab (REPATHA SURECLICK) 161 MG/ML SOAJ Inject 140 mg into the skin every 14 (fourteen) days. 09/12/18   Dorothy Spark, MD  ezetimibe (ZETIA) 10 MG tablet Take 1 tablet (10 mg total) by mouth daily. 05/27/18   [name], DO  FLUoxetine (PROZAC) 20 MG tablet Take 20 mg by mouth daily.    [provider]  fluticasone (FLONASE) 50 MCG/ACT nasal spray Place 2 sprays into both nostrils daily as needed for allergies or rhinitis.    [provider]  isosorbide mononitrate (IMDUR) 30 MG 24 hr tablet Take 1 tablet (30 mg total) by mouth daily. 11/15/18   Dorothy Spark, MD  lansoprazole (PREVACID) 30 MG capsule TAKE ONE CAPSULE BY MOUTH TWICE A DAY BEFORE A MEAL 11/18/18   Wendling, Crosby Oyster, DO  levocetirizine (XYZAL) 5 MG tablet Take 1 tablet (5 mg total) by mouth every evening. 05/27/18   [name], DO  losartan (COZAAR)  100 MG tablet Take 100 mg by mouth daily.    [provider]  metoprolol succinate (TOPROL-XL) 50 MG 24 hr tablet Take 1 tablet (50 mg total) by mouth daily. Take with or immediately following a meal. 10/09/18   Wendling, Crosby Oyster, DO  nitroGLYCERIN (NITROSTAT) 0.4 MG SL tablet Place 1 tablet (0.4 mg total) under the tongue every 5 (five) minutes as needed for chest pain. 05/28/18   Imogene Burn, PA-C  ondansetron (ZOFRAN) 4 MG tablet Take 1 tablet (4 mg total) by mouth every 6 (six) hours as needed for nausea. 11/01/18   Mikhail, Velta Addison, DO  Polyethyl Glycol-Propyl Glycol (SYSTANE OP) Place 1 drop into both eyes every 3 (three) hours as needed (dry eyes).     [provider]  promethazine (PHENERGAN) 12.5 MG tablet Take 1 tablet (12.5 mg total) by mouth every 6 (six) hours as needed for refractory nausea / vomiting. 11/01/18   Cristal Ford, DO    Physical Exam: Vitals:   12/02/18 1915 12/02/18 1930 12/02/18 2103 12/02/18 2327  BP: (!) 143/108 (!) 139/99 (!) 145/100 (!) 124/100  Pulse: (!) 113 (!) 117 (!) 115   Resp: _0 Temp:   99.5 F (37.5 C) 99.1 F (37.3 C)  TempSrc:   Oral Oral  SpO2: 95% 98% 98% 98%  Weight:   97.5 kg   Height:   5' 10" (1.778 m)     Physical Exam  Constitutional: No distress.  HENT:  Head: Normocephalic.  Eyes: Right eye exhibits no discharge. Left eye exhibits no discharge.  Neck: Neck supple.  Cardiovascular: Normal rate, regular rhythm and intact distal pulses.  Pulmonary/Chest: Effort normal. No respiratory distress. He has no wheezes. He has no rales.  Abdominal: Soft. Bowel sounds are normal. He exhibits no distension. There is no abdominal tenderness. There is no rebound and no guarding.  Musculoskeletal:        General: No edema.  Neurological:  Somnolent, waking up intermittently to answer questions  Skin: Skin is warm and dry. He is not diaphoretic.     Labs on Admission: I have personally reviewed  following labs and imaging studies  CBC: Recent Labs  Lab 12/02/18 1135 12/02/18 1508  WBC 6.4  --   NEUTROABS 4.6  --  HGB 13.1 12.9*  HCT 38.4* 38.0*  MCV 97.5  --   PLT 43*  --    Basic Metabolic Panel: Recent Labs  Lab 12/02/18 1135 12/02/18 1508 12/02/18 2249  NA 133* 130* 135  K 3.9 3.8 3.8  CL 94*  --  97*  CO2 8*  --  15*  GLUCOSE 138*  --  143*  BUN 22*  --  22*  CREATININE 1.50*  --  1.41*  CALCIUM 9.7  --  9.6  MG  --   --  1.6*   GFR: Estimated Creatinine Clearance: 69.3 mL/min (A) (by C-G formula based on SCr of 1.41 mg/dL (H)). Liver Function Tests: Recent Labs  Lab 12/02/18 1135  AST 163*  ALT 74*  ALKPHOS 64  BILITOT 5.1*  PROT 8.8*  ALBUMIN 4.3   Recent Labs  Lab 12/02/18 1135  LIPASE 346*   No results for input(s): AMMONIA in the last 168 hours. Coagulation Profile: No results for input(s): INR, PROTIME in the last 168 hours. Cardiac Enzymes: No results for input(s): CKTOTAL, CKMB, CKMBINDEX, TROPONINI in the last 168 hours. BNP (last 3 results) No results for input(s): PROBNP in the last 8760 hours. HbA1C: No results for input(s): HGBA1C in the last 72 hours. CBG: Recent Labs  Lab 12/02/18 1127  GLUCAP 135*   Lipid Profile: No results for input(s): CHOL, HDL, LDLCALC, TRIG, CHOLHDL, LDLDIRECT in the last 72 hours. Thyroid Function Tests: No results for input(s): TSH, T4TOTAL, FREET4, T3FREE, THYROIDAB in the last 72 hours. Anemia Panel: No results for input(s): VITAMINB12, FOLATE, FERRITIN, TIBC, IRON, RETICCTPCT in the last 72 hours. Urine analysis:    Component Value Date/Time   COLORURINE BROWN (A) 10/29/2018 1707   APPEARANCEUR HAZY (A) 10/29/2018 1707   LABSPEC 1.015 10/29/2018 1707   PHURINE 6.0 10/29/2018 1707   GLUCOSEU NEGATIVE 10/29/2018 1707   GLUCOSEU NEGATIVE 05/27/2018 1421   HGBUR MODERATE (A) 10/29/2018 1707   BILIRUBINUR MODERATE (A) 10/29/2018 1707   BILIRUBINUR negative 02/23/2017 1539   KETONESUR  40 (A) 10/29/2018 1707   PROTEINUR 100 (A) 10/29/2018 1707   UROBILINOGEN 0.2 05/27/2018 1421   NITRITE NEGATIVE 10/29/2018 1707   LEUKOCYTESUR NEGATIVE 10/29/2018 1707    Radiological Exams on Admission: Dg Chest 2 View  Result Date: 12/02/2018 CLINICAL DATA:  Shortness of breath EXAM: CHEST - 2 VIEW COMPARISON:  CTA chest dated 10/29/2018. Partial comparison to CT abdomen/pelvis dated 12/02/2018 at 0111 hours FINDINGS: Mild left lower lobe opacity, new from recent CT abdomen/pelvis, favoring atelectasis. Right lung is clear, noting eventration right hemidiaphragm. No pleural effusion or pneumothorax. Cardiomegaly. Visualized osseous structures are within normal limits. IMPRESSION: Mild left lower lobe opacity, new from recent CT abdomen/pelvis earlier today, favoring atelectasis. Electronically Signed   By: Julian Hy M.D.   On: 12/02/2018 23:15   Ct Abdomen Pelvis W Contrast  Result Date: 12/02/2018 CLINICAL DATA:  Pain. Acute. Generalized. Personal history tubular adenoma the colon. EXAM: CT ABDOMEN AND PELVIS WITH CONTRAST TECHNIQUE: Multidetector CT imaging of the abdomen and pelvis was performed using the standard protocol following bolus administration of intravenous contrast. CONTRAST:  124m OMNIPAQUE IOHEXOL 300 MG/ML  SOLN COMPARISON:  CT of the abdomen and pelvis 10/29/2018 FINDINGS: Lower chest: The lung bases are clear without focal nodule, mass, or airspace disease. Heart size is normal. Coronary artery calcifications are again noted. No significant pleural or pericardial effusion is present. Hepatobiliary: There is diffuse fatty infiltration of the liver. Homogeneous hyperdense material is present  in the gallbladder. Common bile duct is normal. No focal hepatic lesions are present. Pancreas: Unremarkable. No pancreatic ductal dilatation or surrounding inflammatory changes. Spleen: Normal in size without focal abnormality. Adrenals/Urinary Tract: Adrenal glands are normal  bilaterally. Exophytic cyst of the left kidney are again noted. There is no stone or mass lesion. Ureters are within normal limits. Posterior bladder stone the left is stable. Is fat in the bladder wall anteriorly, stable. No other discrete lesions are present. Stomach/Bowel: Stomach and duodenum are within normal limits. Small bowel is unremarkable. Terminal ileum is within normal limits. Appendectomy is noted. Ascending and proximal transverse colon are mostly collapsed. Transverse and descending colon are within normal limits. Sigmoid colon is unremarkable. Vascular/Lymphatic: Atherosclerotic calcifications are present in the aorta without aneurysm. No significant adenopathy is present. Reproductive: Prostate is mildly enlarged. No discrete lesions are present. Other: No abdominal wall hernia or abnormality. No abdominopelvic ascites. Musculoskeletal: Mild degenerative changes are noted in the lower lumbar spine. Degenerative changes are noted at the SI joints. Hips are within normal limits. IMPRESSION: 1. No acute or focal lesion to explain the patient's abdominal pain. 2. Extensive hepatic steatosis. 3.  Aortic Atherosclerosis (ICD10-I70.0). 4. Coronary artery disease. 5. Degenerative changes in the lower lumbar spine. Electronically Signed   By: San Morelle M.D.   On: 12/02/2018 13:27   US Abdomen Limited Ruq  Result Date: 12/02/2018 CLINICAL DATA:  58 year old male with elevated bilirubin. EXAM: ULTRASOUND ABDOMEN LIMITED RIGHT UPPER QUADRANT COMPARISON:  CT of the abdomen pelvis dated 12/02/2018 FINDINGS: Gallbladder: There is sludge in the gallbladder. No gallbladder wall thickening or pericholecystic fluid. Negative sonographic Murphy's sign. Common bile duct: Diameter: 8 mm Liver: The diffuse increased liver echogenicity most consistent with fatty infiltration. Superimposed fibrosis or inflammation is not excluded. Portal vein is patent on color Doppler imaging with normal direction of blood  flow towards the liver. Other: None. IMPRESSION: 1. Gallbladder sludge. No sonographic evidence of acute cholecystitis. 2. Fatty liver. Electronically Signed   By: Anner Crete M.D.   On: 12/02/2018 23:31    EKG: Independently reviewed.  Sinus tachycardia, heart rate 122.  QTc 505.  Diffuse T wave abnormality similar to prior tracing from October 29, 2018.  Assessment/Plan Principal Problem:   Intractable vomiting with nausea Active Problems:   Thrombocytopenia (HCC)   Elevated LFTs   Elevated lipase   Metabolic acidosis   Intractable nausea and vomiting, elevated lipase and LFTs  Unclear exact etiology,?acute pancreatitis. Patient is presenting with 2-day history of abdominal pain and emesis.  History was limited as patient was somnolent.  He described pain as being intermittent and periumbilical.  However, patient reported upper abdominal pain to ED provider.  Afebrile and no leukocytosis. Lipase 346.  AST 163, ALT 74, and T bili 5.1.  Alk phos normal.  AST and ALT have previously been elevated as well but T bili was previously normal.  CT abdomen pelvis with contrast showing no acute or focal lesion to explain the patient's abdominal pain.  Extensive hepatic steatosis.  Homogenous hyperdense material present in the gallbladder.  Common bile duct is normal.  Pancreas unremarkable.  No pancreatic ductal dilatation or surrounding inflammatory changes.  Right upper quadrant ultrasound showing gallbladder sludge and no sonographic evidence of acute cholecystitis.  CBD 8 mm in diameter. -Patient was admitted 7/7 to 7/10 and work-up done was done at that time for elevated LFTs and bilirubin.  Hepatitis panel at that time unremarkable.  MRCP without evidence of cholelithiasis or  choledocholithiasis.  Bile ducts within normal limits.  HIDA with nonvisualization of the gallbladder felt to be indicative of cystic duct obstruction indicative of acute cholecystitis.  GI and general surgery were consulted  during this hospitalization.  Please see discharge summary. -IV fluid hydration -IV Phenergan PRN nausea/vomiting -Continue to monitor LFTs -Morphine PRN pain -Check PT/INR -Consult GI in a.m.  High anion gap metabolic acidosis with respiratory compensation Bicarb 8.  Anion gap 31.  ABG with pH 7.38, PCO2 27, and PO2 117.  DKA less likely given no significant elevation of blood glucose.  Patient is not on an SGLT2 inhibitor at home.  Blood ethanol, acetaminophen, and salicylate levels not elevated.  Lactic acid normal.  Repeat labs after 500 cc IV fluid bolus in the ED showing improvement.  Bicarb 15 and anion gap 23.  AKI also likely contributing to metabolic acidosis. -IV fluid hydration -Check volatile alcohol levels (acetone, isopropyl, methanol) -BMP every 4 hours  Shortness of breath Likely due to hyperventilation as a compensation for severe metabolic acidosis.  Not hypoxic.  Placed on 2 L supplemental oxygen for comfort.  Lower lungs visualized on CT without infiltrate.  Chest x-ray with mild left lower lobe opacity, new from CT abdomen/pelvis, favoring atelectasis. -Continuous pulse ox, continue to monitor  Persistent tachycardia Infectious etiology less likely given no fever or leukocytosis.  Lactic acid normal.  Patient takes a beta-blocker at home. -Lopressor PRN  Acute on chronic thrombocytopenia Platelet count 43,000.  Patient has chronic thrombocytopenia and platelet count was 128,000 on 7/29.  No signs of active bleeding. -Repeat CBC in a.m. to confirm -Immature platelet fraction  QT prolongation on EKG -Cardiac monitoring -Avoid QT prolonging drugs if possible -Keep K >4 and Mag >2 -Repeat EKG in a.m.  AKI BUN 22, creatinine 1.5.  Creatinine was 1.1 on 7/29.  Likely prerenal due to dehydration from vomiting. -IV fluid hydration -Continue to monitor renal function -Monitor urine output -Urinalysis  Chronic combined systolic and diastolic congestive heart  failure Not volume overloaded on exam.  Chest x-ray without evidence of vascular congestion. -Hold diuretics at this time  Asthma -Stable.  No bronchospasm.  DuoNebs PRN.  Unable to safely order home medications at this time as pharmacy medication reconciliation is pending.  DVT prophylaxis: SCDs given thrombocytopenia Code Status: Full code Family Communication: No family available at this time. Disposition Plan: Anticipate discharge after clinical improvement. Consults called: None Admission status: It is my clinical opinion that admission to INPATIENT is reasonable and necessary in this 58 y.o. male  presenting with elevated lipase and LFTs, high anion gap metabolic acidosis, and AKI.  Needs IV fluid hydration, IV antiemetic, and careful monitoring of labs.  Given the aforementioned, the predictability of an adverse outcome is felt to be significant. I expect that the patient will require at least 2 midnights in the hospital to treat this condition.   The medical decision making on this patient was of high complexity and the patient is at high risk for clinical deterioration, therefore this is a level 3 visit.  Shela Leff MD [facility] Pager [phone]  If 7PM-7AM, please contact night-coverage [email or web address] Password [facility]  12/03/2018, 2:39 AM

## 2018-12-03 ENCOUNTER — Inpatient Hospital Stay (HOSPITAL_COMMUNITY): Payer: 59

## 2018-12-03 ENCOUNTER — Ambulatory Visit: Payer: 59

## 2018-12-03 DIAGNOSIS — R748 Abnormal levels of other serum enzymes: Secondary | ICD-10-CM | POA: Diagnosis present

## 2018-12-03 DIAGNOSIS — E872 Acidosis, unspecified: Secondary | ICD-10-CM | POA: Diagnosis present

## 2018-12-03 LAB — URINALYSIS, ROUTINE W REFLEX MICROSCOPIC
Bilirubin Urine: NEGATIVE
Glucose, UA: NEGATIVE mg/dL
Ketones, ur: 20 mg/dL — AB
Leukocytes,Ua: NEGATIVE
Nitrite: NEGATIVE
Protein, ur: 100 mg/dL — AB
Specific Gravity, Urine: 1.039 — ABNORMAL HIGH (ref 1.005–1.030)
pH: 6 (ref 5.0–8.0)

## 2018-12-03 LAB — CBC
HCT: 36.2 % — ABNORMAL LOW (ref 39.0–52.0)
Hemoglobin: 12.7 g/dL — ABNORMAL LOW (ref 13.0–17.0)
MCH: 34.1 pg — ABNORMAL HIGH (ref 26.0–34.0)
MCHC: 35.1 g/dL (ref 30.0–36.0)
MCV: 97.3 fL (ref 80.0–100.0)
Platelets: 47 10*3/uL — ABNORMAL LOW (ref 150–400)
RBC: 3.72 MIL/uL — ABNORMAL LOW (ref 4.22–5.81)
RDW: 15.5 % (ref 11.5–15.5)
WBC: 5.8 10*3/uL (ref 4.0–10.5)
nRBC: 0.5 % — ABNORMAL HIGH (ref 0.0–0.2)

## 2018-12-03 LAB — BASIC METABOLIC PANEL
Anion gap: 22 — ABNORMAL HIGH (ref 5–15)
Anion gap: 20 — ABNORMAL HIGH (ref 5–15)
BUN: 24 mg/dL — ABNORMAL HIGH (ref 6–20)
BUN: 24 mg/dL — ABNORMAL HIGH (ref 6–20)
CO2: 14 mmol/L — ABNORMAL LOW (ref 22–32)
CO2: 17 mmol/L — ABNORMAL LOW (ref 22–32)
Calcium: 9.6 mg/dL (ref 8.9–10.3)
Calcium: 9.6 mg/dL (ref 8.9–10.3)
Chloride: 97 mmol/L — ABNORMAL LOW (ref 98–111)
Chloride: 99 mmol/L (ref 98–111)
Creatinine, Ser: 1.39 mg/dL — ABNORMAL HIGH (ref 0.61–1.24)
Creatinine, Ser: 1.33 mg/dL — ABNORMAL HIGH (ref 0.61–1.24)
GFR calc Af Amer: >60 mL/min (ref >60)
GFR calc Af Amer: >60 mL/min (ref >60)
GFR calc non Af Amer: 57 mL/min — ABNORMAL LOW (ref >60)
GFR calc non Af Amer: 60 mL/min — ABNORMAL LOW (ref >60)
Glucose, Bld: 162 mg/dL — ABNORMAL HIGH (ref 70–99)
Glucose, Bld: 141 mg/dL — ABNORMAL HIGH (ref 70–99)
Potassium: 3.8 mmol/L (ref 3.5–5.1)
Potassium: 3.9 mmol/L (ref 3.5–5.1)
Sodium: 133 mmol/L — ABNORMAL LOW (ref 135–145)
Sodium: 136 mmol/L (ref 135–145)

## 2018-12-03 LAB — HEPATIC FUNCTION PANEL
ALT: 68 U/L — ABNORMAL HIGH (ref 0–44)
AST: 153 U/L — ABNORMAL HIGH (ref 15–41)
Albumin: 3.8 g/dL (ref 3.5–5.0)
Alkaline Phosphatase: 63 U/L (ref 38–126)
Bilirubin, Direct: 1.6 mg/dL — ABNORMAL HIGH (ref 0.0–0.2)
Indirect Bilirubin: 1.7 mg/dL — ABNORMAL HIGH (ref 0.3–0.9)
Total Bilirubin: 3.3 mg/dL — ABNORMAL HIGH (ref 0.3–1.2)
Total Protein: 7.9 g/dL (ref 6.5–8.1)

## 2018-12-03 LAB — IMMATURE PLATELET FRACTION: Immature Platelet Fraction: 13.1 % — ABNORMAL HIGH (ref 1.2–8.6)

## 2018-12-03 LAB — VOLATILES,BLD-ACETONE,ETHANOL,ISOPROP,METHANOL
Acetone, blood: 0.013 % (ref 0.000–0.010)
Ethanol, blood: NEGATIVE % (ref 0.000–0.010)
Isopropanol, blood: NEGATIVE % (ref 0.000–0.010)
Methanol, blood: NEGATIVE % (ref 0.000–0.010)

## 2018-12-03 LAB — PROTIME-INR
INR: 1.2 (ref 0.8–1.2)
Prothrombin Time: 15.1 seconds (ref 11.4–15.2)

## 2018-12-03 MED ORDER — LORATADINE 10 MG PO TABS
10.0000 mg | ORAL_TABLET | Freq: Every day | ORAL | Status: DC
  Administered 2018-12-03 – 2018-12-10 (×8): 10 mg via ORAL
  Filled 2018-12-03 (×8): qty 1

## 2018-12-03 MED ORDER — HYDROMORPHONE HCL 1 MG/ML IJ SOLN
0.5000 mg | INTRAMUSCULAR | Status: DC | PRN
  Administered 2018-12-03 – 2018-12-10 (×37): 0.5 mg via INTRAVENOUS
  Filled 2018-12-03 (×37): qty 0.5

## 2018-12-03 MED ORDER — AMLODIPINE BESYLATE 10 MG PO TABS
10.0000 mg | ORAL_TABLET | Freq: Every day | ORAL | Status: DC
  Administered 2018-12-03 – 2018-12-11 (×9): 10 mg via ORAL
  Filled 2018-12-03 (×10): qty 1

## 2018-12-03 MED ORDER — NITROGLYCERIN 0.4 MG SL SUBL: 0.4000 mg | SUBLINGUAL_TABLET | SUBLINGUAL | Status: DC | PRN

## 2018-12-03 MED ORDER — IPRATROPIUM-ALBUTEROL 0.5-2.5 (3) MG/3ML IN SOLN: 3.0000 mL | Freq: Four times a day (QID) | RESPIRATORY_TRACT | Status: DC | PRN

## 2018-12-03 MED ORDER — FAMOTIDINE IN NACL 20-0.9 MG/50ML-% IV SOLN
20.0000 mg | Freq: Two times a day (BID) | INTRAVENOUS | Status: DC
  Administered 2018-12-03 – 2018-12-09 (×13): 20 mg via INTRAVENOUS
  Filled 2018-12-03 (×17): qty 50

## 2018-12-03 MED ORDER — METOPROLOL SUCCINATE ER 50 MG PO TB24
50.0000 mg | ORAL_TABLET | Freq: Every day | ORAL | Status: DC
  Administered 2018-12-03 – 2018-12-11 (×9): 50 mg via ORAL
  Filled 2018-12-03 (×9): qty 1

## 2018-12-03 MED ORDER — PROMETHAZINE HCL 25 MG/ML IJ SOLN
6.2500 mg | Freq: Four times a day (QID) | INTRAMUSCULAR | Status: DC | PRN
  Administered 2018-12-03 – 2018-12-11 (×24): 6.25 mg via INTRAVENOUS
  Filled 2018-12-03 (×24): qty 1

## 2018-12-03 MED ORDER — POTASSIUM CHLORIDE CRYS ER 20 MEQ PO TBCR
20.0000 meq | EXTENDED_RELEASE_TABLET | Freq: Once | ORAL | Status: AC
  Administered 2018-12-03: 20 meq via ORAL
  Filled 2018-12-03: qty 1

## 2018-12-03 MED ORDER — EZETIMIBE 10 MG PO TABS
10.0000 mg | ORAL_TABLET | Freq: Every day | ORAL | Status: DC
  Administered 2018-12-03 – 2018-12-11 (×9): 10 mg via ORAL
  Filled 2018-12-03 (×9): qty 1

## 2018-12-03 MED ORDER — LORAZEPAM 2 MG/ML IJ SOLN
1.0000 mg | Freq: Once | INTRAMUSCULAR | Status: AC
  Administered 2018-12-03: 1 mg via INTRAVENOUS
  Filled 2018-12-03: qty 1

## 2018-12-03 MED ORDER — FLUOXETINE HCL 20 MG PO CAPS
20.0000 mg | ORAL_CAPSULE | Freq: Every day | ORAL | Status: DC
  Administered 2018-12-03 – 2018-12-11 (×9): 20 mg via ORAL
  Filled 2018-12-03 (×11): qty 1

## 2018-12-03 MED ORDER — LEVOCETIRIZINE DIHYDROCHLORIDE 5 MG PO TABS: 5.0000 mg | ORAL_TABLET | Freq: Every evening | ORAL | Status: DC

## 2018-12-03 MED ORDER — METOPROLOL TARTRATE 5 MG/5ML IV SOLN: 5.0000 mg | INTRAVENOUS | Status: DC | PRN

## 2018-12-03 MED ORDER — ISOSORBIDE MONONITRATE ER 30 MG PO TB24
30.0000 mg | ORAL_TABLET | Freq: Every day | ORAL | Status: DC
  Administered 2018-12-03 – 2018-12-11 (×9): 30 mg via ORAL
  Filled 2018-12-03 (×9): qty 1

## 2018-12-03 MED ORDER — GADOBUTROL 1 MMOL/ML IV SOLN
10.0000 mL | Freq: Once | INTRAVENOUS | Status: AC | PRN
  Administered 2018-12-03: 10 mL via INTRAVENOUS

## 2018-12-03 MED ORDER — TRAZODONE HCL 50 MG PO TABS: 25.0000 mg | ORAL_TABLET | Freq: Every evening | ORAL | Status: DC | PRN

## 2018-12-03 MED ORDER — SODIUM CHLORIDE 0.9 % IV SOLN
INTRAVENOUS | Status: DC
  Administered 2018-12-03 – 2018-12-05 (×5): via INTRAVENOUS
  Administered 2018-12-06: 1000 mL via INTRAVENOUS
  Administered 2018-12-06 – 2018-12-09 (×8): via INTRAVENOUS

## 2018-12-03 NOTE — Plan of Care (Signed)
  Problem: Education: Goal: Knowledge of General Education information will improve Description: Including pain rating scale, medication(s)/side effects and non-pharmacologic comfort measures Outcome: Progressing   Problem: Clinical Measurements: Goal: Ability to maintain clinical measurements within normal limits will improve Outcome: Progressing   

## 2018-12-03 NOTE — Progress Notes (Signed)
PROGRESS NOTE    [name]  LKT:[id number] DOB: 09/26/1960 DOA: 12/02/2018 PCP: [name], DO    Brief Narrative:  Patient admitted with very interesting episodic abdominal pain that is associated with elevated LFTs, thrombocytopenia.  58 year old gentleman with history of asthma, TIA, thrombocytopenia, coronary artery disease, hypertension, hyperlipidemia, hypothyroidism, GERD and stage II chronic kidney disease and combined heart failure presented to the emergency room with 2 days of vomiting.  Patient had intermittent periumbilical abdominal pain and epigastric pain, vomiting and unable to eat anything since last 2 days.  Interestingly he was admitted to the hospital multiple times in the past with similar symptoms including 1 about a month ago where he underwent extensive evaluation including MRCP that did not show any biliary obstruction, HIDA scan showed nonvisualized gallbladder.  Was seen by surgery and asked to follow-up outpatient as he had improvement in symptoms.  Patient is poor historian, however he states that he was feeling well until 2 days ago.  He has not taken Plavix since Friday as he continued to have vomiting.  Occasional alcohol.  Denies drugs and UDS was negative. In the emergency room, tachypneic and tachycardic, on room air.  Elevated lipase and mild elevation of transaminases.  Metabolic acidosis with anion gap of 31 and mildly elevated creatinine from baseline.  Platelets 43.  CT scan of the abdomen pelvis as well as right upper quadrant ultrasound was normal.  Assessment & Plan:   Principal Problem:   Intractable vomiting with nausea Active Problems:   Essential hypertension   GERD   Thrombocytopenia (HCC)   Hypercholesteremia   Elevated LFTs   Elevated lipase   Metabolic acidosis  Intractable nausea and vomiting, elevated lipase and LFTs: Chronic and recurrent with severe symptoms on presentation. Symptomatic gallbladder disease?  Passed  biliary stone is also possibility. No evidence of acute cholecystitis. Continue IV fluids.  N.p.o.  Adequate IV opiates for pain relief.  Nausea medications.  No indication for antibiotics at this time. According to previous plan, I called and discussed case with general surgery, last Plavix taken was on 8/8.  He has not taken any Plavix since then. Ordering MRCP and tentative plan for cholecystectomy.  Abnormal LFTs and thrombocytopenia: Extensively investigated in earlier admission.  Hepatitis panel negative.  Does have fatty infiltration of the liver.  Fairly stable.  High anion gap metabolic acidosis: Presentation with bicarb of 8, anion gap 31.  Treated with isotonic fluid with clinical improvement.  No osmolar gap.  No evidence of significantly elevated acetone level in the body.  Negative for ethanol, isopropanol and methanol.  Probably related to starvation ketoacidosis.  Treated with aggressive IV fluids and already improving.  GERD: On PPI continue.  Thrombocytopenia: Acute on chronic.  Interestingly improves with improvement of liver functions in the past.  Will need close monitoring.  Coronary artery disease: Cardiac cath on 05/2018.  No new stent placed.  Resume his nitrates, beta-blockers, antilipid medications.  Holding aspirin and Plavix in anticipation of procedures.  History of depression: On Prozac, resume.  DVT prophylaxis: SCDs Code Status: Full code Family Communication: None Disposition Plan: Medical telemetry.  Home pending clinical improvement.   Consultants:   General surgery  Procedures:   None  Antimicrobials:   None   Subjective: Patient seen and examined.  No overnight events.  He complains of periumbilical pain that exacerbated after nurses checked on his belly.  Has been n.p.o., no active vomiting.  Morphine causes allergies, he wants Dilaudid.  Objective:  Vitals:   12/02/18 1930 12/02/18 2103 12/02/18 2327 12/03/18 0507  BP: (!) 139/99 (!)  145/100 (!) 124/100 (!) 134/98  Pulse: (!) 117 (!) 115  (!) 112  Resp: 19 20 20 15   Temp:  99.5 F (37.5 C) 99.1 F (37.3 C) 98.4 F (36.9 C)  TempSrc:  Oral Oral Oral  SpO2: 98% 98% 98% 99%  Weight:  97.5 kg    Height:  5\' 10"  (1.778 m)      Intake/Output Summary (Last 24 hours) at 12/03/2018 1344 Last data filed at 12/03/2018 0600 Gross per 24 hour  Intake 1110 ml  Output 750 ml  Net 360 ml   Filed Weights   12/02/18 1115 12/02/18 2103  Weight: 99.8 kg 97.5 kg    Examination:  General exam: Appears calm and comfortable, chronically sick looking, flat affect. Respiratory system: Clear to auscultation. Respiratory effort normal. Cardiovascular system: S1 & S2 heard, RRR. No JVD, murmurs, rubs, gallops or clicks. No pedal edema. Gastrointestinal system: Abdomen is nondistended, soft and nontender. No organomegaly or masses felt. Normal bowel sounds heard.  No residual guarding.  He has midline surgical scars that are healthy. Central nervous system: Alert and oriented. No focal neurological deficits. Extremities: Symmetric 5 x 5 power. Skin: No rashes, lesions or ulcers Psychiatry: Judgement and insight appear normal. Mood & affect anxious and flat.    Data Reviewed: I have personally reviewed following labs and imaging studies  CBC: Recent Labs  Lab 12/02/18 1135 12/02/18 1508 12/03/18 0244  WBC 6.4  --  5.8  NEUTROABS 4.6  --   --   HGB 13.1 12.9* 12.7*  HCT 38.4* 38.0* 36.2*  MCV 97.5  --  97.3  PLT 43*  --  47*   Basic Metabolic Panel: Recent Labs  Lab 12/02/18 1135 12/02/18 1508 12/02/18 2249 12/03/18 0244 12/03/18 0704  NA 133* 130* 135 133* 136  K 3.9 3.8 3.8 3.8 3.9  CL 94*  --  97* 97* 99  CO2 8*  --  15* 14* 17*  GLUCOSE 138*  --  143* 162* 141*  BUN 22*  --  22* 24* 24*  CREATININE 1.50*  --  1.41* 1.39* 1.33*  CALCIUM 9.7  --  9.6 9.6 9.6  MG  --   --  1.6*  --   --    GFR: Estimated Creatinine Clearance: 73.5 mL/min (A) (by C-G formula  based on SCr of 1.33 mg/dL (H)). Liver Function Tests: Recent Labs  Lab 12/02/18 1135 12/03/18 0244  AST 163* 153*  ALT 74* 68*  ALKPHOS 64 63  BILITOT 5.1* 3.3*  PROT 8.8* 7.9  ALBUMIN 4.3 3.8   Recent Labs  Lab 12/02/18 1135  LIPASE 346*   No results for input(s): AMMONIA in the last 168 hours. Coagulation Profile: Recent Labs  Lab 12/03/18 0244  INR 1.2   Cardiac Enzymes: No results for input(s): CKTOTAL, CKMB, CKMBINDEX, TROPONINI in the last 168 hours. BNP (last 3 results) No results for input(s): PROBNP in the last 8760 hours. HbA1C: No results for input(s): HGBA1C in the last 72 hours. CBG: Recent Labs  Lab 12/02/18 1127  GLUCAP 135*   Lipid Profile: No results for input(s): CHOL, HDL, LDLCALC, TRIG, CHOLHDL, LDLDIRECT in the last 72 hours. Thyroid Function Tests: No results for input(s): TSH, T4TOTAL, FREET4, T3FREE, THYROIDAB in the last 72 hours. Anemia Panel: No results for input(s): VITAMINB12, FOLATE, FERRITIN, TIBC, IRON, RETICCTPCT in the last 72 hours. Sepsis Labs: Recent Labs  Lab 12/02/18 2249  LATICACIDVEN 1.8    Recent Results (from the past 240 hour(s))  SARS Coronavirus 2 [facility] order, Performed in [facility] hospital lab) Nasopharyngeal Nasopharyngeal Swab     Status: None   Collection Time: 12/02/18  3:14 PM   Specimen: Nasopharyngeal Swab  Result Value Ref Range Status   SARS Coronavirus 2 NEGATIVE NEGATIVE Final    Comment: (NOTE) If result is NEGATIVE SARS-CoV-2 target nucleic acids are NOT DETECTED. The SARS-CoV-2 RNA is generally detectable in upper and lower  respiratory specimens during the acute phase of infection. The lowest  concentration of SARS-CoV-2 viral copies this assay can detect is 250  copies / mL. A negative result does not preclude SARS-CoV-2 infection  and should not be used as the sole basis for treatment or other  patient management decisions.  A negative result may occur with  improper specimen  collection / handling, submission of specimen other  than nasopharyngeal swab, presence of viral mutation(s) within the  areas targeted by this assay, and inadequate number of viral copies  (<250 copies / mL). A negative result must be combined with clinical  observations, patient history, and epidemiological information. If result is POSITIVE SARS-CoV-2 target nucleic acids are DETECTED. The SARS-CoV-2 RNA is generally detectable in upper and lower  respiratory specimens dur ing the acute phase of infection.  Positive  results are indicative of active infection with SARS-CoV-2.  Clinical  correlation with patient history and other diagnostic information is  necessary to determine patient infection status.  Positive results do  not rule out bacterial infection or co-infection with other viruses. If result is PRESUMPTIVE POSTIVE SARS-CoV-2 nucleic acids MAY BE PRESENT.   A presumptive positive result was obtained on the submitted specimen  and confirmed on repeat testing.  While 2019 novel coronavirus  (SARS-CoV-2) nucleic acids may be present in the submitted sample  additional confirmatory testing may be necessary for epidemiological  and / or clinical management purposes  to differentiate between  SARS-CoV-2 and other Sarbecovirus currently known to infect humans.  If clinically indicated additional testing with an alternate test  methodology [phone]) is advised. The SARS-CoV-2 RNA is generally  detectable in upper and lower respiratory sp ecimens during the acute  phase of infection. The expected result is Negative. Fact Sheet for Patients:  StrictlyIdeas.no Fact Sheet for Healthcare Providers: BankingDealers.co.za This test is not yet approved or cleared by the [place] FDA and has been authorized for detection and/or diagnosis of SARS-CoV-2 by FDA under an Emergency Use Authorization (EUA).  This EUA will remain in effect  (meaning this test can be used) for the duration of the COVID-19 declaration under Section 564(b)(1) of the Act, 21 U.S.C. section 360bbb-3(b)(1), unless the authorization is terminated or revoked sooner. Performed at [facility], [address], [place], [place] [address]          Radiology Studies: Dg Chest 2 View  Result Date: 12/02/2018 CLINICAL DATA:  Shortness of breath EXAM: CHEST - 2 VIEW COMPARISON:  CTA chest dated 10/29/2018. Partial comparison to CT abdomen/pelvis dated 12/02/2018 at 0111 hours FINDINGS: Mild left lower lobe opacity, new from recent CT abdomen/pelvis, favoring atelectasis. Right lung is clear, noting eventration right hemidiaphragm. No pleural effusion or pneumothorax. Cardiomegaly. Visualized osseous structures are within normal limits. IMPRESSION: Mild left lower lobe opacity, new from recent CT abdomen/pelvis earlier today, favoring atelectasis. Electronically Signed   By: Julian Hy M.D.   On: 12/02/2018 23:15  Ct Abdomen Pelvis W Contrast  Result Date: 12/02/2018 CLINICAL DATA:  Pain. Acute. Generalized. Personal history tubular adenoma the colon. EXAM: CT ABDOMEN AND PELVIS WITH CONTRAST TECHNIQUE: Multidetector CT imaging of the abdomen and pelvis was performed using the standard protocol following bolus administration of intravenous contrast. CONTRAST:  132mL OMNIPAQUE IOHEXOL 300 MG/ML  SOLN COMPARISON:  CT of the abdomen and pelvis 10/29/2018 FINDINGS: Lower chest: The lung bases are clear without focal nodule, mass, or airspace disease. Heart size is normal. Coronary artery calcifications are again noted. No significant pleural or pericardial effusion is present. Hepatobiliary: There is diffuse fatty infiltration of the liver. Homogeneous hyperdense material is present in the gallbladder. Common bile duct is normal. No focal hepatic lesions are present. Pancreas: Unremarkable. No pancreatic ductal dilatation or surrounding inflammatory  changes. Spleen: Normal in size without focal abnormality. Adrenals/Urinary Tract: Adrenal glands are normal bilaterally. Exophytic cyst of the left kidney are again noted. There is no stone or mass lesion. Ureters are within normal limits. Posterior bladder stone the left is stable. Is fat in the bladder wall anteriorly, stable. No other discrete lesions are present. Stomach/Bowel: Stomach and duodenum are within normal limits. Small bowel is unremarkable. Terminal ileum is within normal limits. Appendectomy is noted. Ascending and proximal transverse colon are mostly collapsed. Transverse and descending colon are within normal limits. Sigmoid colon is unremarkable. Vascular/Lymphatic: Atherosclerotic calcifications are present in the aorta without aneurysm. No significant adenopathy is present. Reproductive: Prostate is mildly enlarged. No discrete lesions are present. Other: No abdominal wall hernia or abnormality. No abdominopelvic ascites. Musculoskeletal: Mild degenerative changes are noted in the lower lumbar spine. Degenerative changes are noted at the SI joints. Hips are within normal limits. IMPRESSION: 1. No acute or focal lesion to explain the patient's abdominal pain. 2. Extensive hepatic steatosis. 3.  Aortic Atherosclerosis (ICD10-I70.0). 4. Coronary artery disease. 5. Degenerative changes in the lower lumbar spine. Electronically Signed   By: San Morelle M.D.   On: 12/02/2018 13:27   US Abdomen Limited Ruq  Result Date: 12/02/2018 CLINICAL DATA:  58 year old male with elevated bilirubin. EXAM: ULTRASOUND ABDOMEN LIMITED RIGHT UPPER QUADRANT COMPARISON:  CT of the abdomen pelvis dated 12/02/2018 FINDINGS: Gallbladder: There is sludge in the gallbladder. No gallbladder wall thickening or pericholecystic fluid. Negative sonographic Murphy's sign. Common bile duct: Diameter: 8 mm Liver: The diffuse increased liver echogenicity most consistent with fatty infiltration. Superimposed fibrosis or  inflammation is not excluded. Portal vein is patent on color Doppler imaging with normal direction of blood flow towards the liver. Other: None. IMPRESSION: 1. Gallbladder sludge. No sonographic evidence of acute cholecystitis. 2. Fatty liver. Electronically Signed   By: Anner Crete M.D.   On: 12/02/2018 23:31        Scheduled Meds: Continuous Infusions: . sodium chloride    . famotidine (PEPCID) IV 20 mg (12/03/18 1149)     LOS: 1 day    Time spent: 35 minutes.    Barb Merino, MD [facility] Pager [phone]  If 7PM-7AM, please contact night-coverage [email or web address] Password [facility] 12/03/2018, 1:44 PM

## 2018-12-03 NOTE — Progress Notes (Signed)
[facility] Progress Note     Subjective: CC-  [name] is a 58yo male PMH CAD s/p STEMI in 2018 with LCx PCI and recent PCI to mid RCA 05/2018 on plavix (last dose 8/7), HTN, CKD stage III, HLD, and hypothyroidism, who presented to [facility] with abdominal pain, nausea, and vomiting. States that this started 8/8 and gradually got worse. Pain is in his chest/epigastric region. Constant, worse with PO intake. He reports multiple episodes of n/v. Last BM was this morning and loose. Denies fever or chills. He does report chest pain and SOB. States that he gets short of breath just ambulating in his room to the door.  Of note, patient was admitted to the hospital 7/7 through 7/10 with similar symptoms and elevated LFTs. Cardiac workup negative. MRCP negative for cholelithiasis, no choledocholithiasis. He did have a HIDA scan at that time that showed nonvisualization of the gallbladder. His pain/symptoms resolved and he was tolerating a diet therefore he was discharged home without intervention. States that his symptoms completely resolved until 8/8.  ED workup included CT scan that showed no acute abnormalities. U/s shows gallbladder sludge but no sonographic evidence of acute cholecystitis. WBC 6.4. Platelets 43. Creatinine 1.5. Lipase 346. AST 163, ALT 74, Alk phos 64, Tbili 5.1. Troponin 48>>44.   Abdominal surgical history: open appendectomy, open partial colectomy Nonsmoker Drinks alcohol only occassionally Employment: chef  Objective: Vital signs in last 24 hours: Temp:  [97.7 F (36.5 C)-99.5 F (37.5 C)] 98.4 F (36.9 C) (08/11 0507) Pulse Rate:  [112-129] 112 (08/11 0507) Resp:  [15-40] 15 (08/11 0507) BP: (124-159)/(98-131) 134/98 (08/11 0507) SpO2:  [95 %-100 %] 99 % (08/11 0507) Weight:  [97.5 kg-99.8 kg] 97.5 kg (08/10 2103)    Intake/Output from previous day: 08/10 0701 - 08/11 0700 In: 1110 [P.O.:60; I.V.:1050] Out: 750 [Urine:750] Intake/Output this  shift: No intake/output data recorded.  PE: General: pleasant, WD/WN AA male who is lying in bed in NAD HEENT: head is normocephalic, atraumatic.  Sclera are noninjected.  Pupils equal and round.  Ears and nose without any masses or lesions.  Mouth is pink and moist. Dentition fair Heart: regular, rate, and rhythm.  No obvious murmurs, gallops, or rubs noted.  Palpable pedal pulses bilaterally Lungs: CTAB, no wheezes, rhonchi, or rales noted.  Respiratory effort nonlabored Abd: well healed RLQ and midline incisions, soft, nondistended, mild epigastric TTP without rebound or guarding, +BS, no masses or organomegaly. Soft/reducible umbilical hernia MS: all 4 extremities are symmetrical with no cyanosis, clubbing, or edema. Skin: warm and dry with no masses, lesions, or rashes Psych: A&Ox3 with an appropriate affect. Neuro: cranial nerves grossly intact, extremity CSM intact bilaterally, normal speech  Lab Results:  Recent Labs    12/02/18 1135 12/02/18 1508 12/03/18 0244  WBC 6.4  --  5.8  HGB 13.1 12.9* 12.7*  HCT 38.4* 38.0* 36.2*  PLT 43*  --  47*   BMET Recent Labs    12/03/18 0244 12/03/18 0704  NA 133* 136  K 3.8 3.9  CL 97* 99  CO2 14* 17*  GLUCOSE 162* 141*  BUN 24* 24*  CREATININE 1.39* 1.33*  CALCIUM 9.6 9.6   PT/INR Recent Labs    12/03/18 0244  LABPROT 15.1  INR 1.2   CMP     Component Value Date/Time   NA 136 12/03/2018 0704   NA 141 06/07/2018 1443   K 3.9 12/03/2018 0704   CL 99 12/03/2018 0704   CO2 17 (  L) 12/03/2018 0704   GLUCOSE 141 (H) 12/03/2018 0704   BUN 24 (H) 12/03/2018 0704   BUN 23 06/07/2018 1443   CREATININE 1.33 (H) 12/03/2018 0704   CALCIUM 9.6 12/03/2018 0704   PROT 7.9 12/03/2018 0244   PROT 7.3 05/29/2017 0915   ALBUMIN 3.8 12/03/2018 0244   ALBUMIN 4.4 05/29/2017 0915   AST 153 (H) 12/03/2018 0244   ALT 68 (H) 12/03/2018 0244   ALKPHOS 63 12/03/2018 0244   BILITOT 3.3 (H) 12/03/2018 0244   BILITOT 0.3 05/29/2017 0915    GFRNONAA 60 (L) 12/03/2018 0704   GFRAA >60 12/03/2018 0704   Lipase     Component Value Date/Time   LIPASE 346 (H) 12/02/2018 1135       Studies/Results: Dg Chest 2 View  Result Date: 12/02/2018 CLINICAL DATA:  Shortness of breath EXAM: CHEST - 2 VIEW COMPARISON:  CTA chest dated 10/29/2018. Partial comparison to CT abdomen/pelvis dated 12/02/2018 at 0111 hours FINDINGS: Mild left lower lobe opacity, new from recent CT abdomen/pelvis, favoring atelectasis. Right lung is clear, noting eventration right hemidiaphragm. No pleural effusion or pneumothorax. Cardiomegaly. Visualized osseous structures are within normal limits. IMPRESSION: Mild left lower lobe opacity, new from recent CT abdomen/pelvis earlier today, favoring atelectasis. Electronically Signed   By: Julian Hy M.D.   On: 12/02/2018 23:15   Ct Abdomen Pelvis W Contrast  Result Date: 12/02/2018 CLINICAL DATA:  Pain. Acute. Generalized. Personal history tubular adenoma the colon. EXAM: CT ABDOMEN AND PELVIS WITH CONTRAST TECHNIQUE: Multidetector CT imaging of the abdomen and pelvis was performed using the standard protocol following bolus administration of intravenous contrast. CONTRAST:  136m OMNIPAQUE IOHEXOL 300 MG/ML  SOLN COMPARISON:  CT of the abdomen and pelvis 10/29/2018 FINDINGS: Lower chest: The lung bases are clear without focal nodule, mass, or airspace disease. Heart size is normal. Coronary artery calcifications are again noted. No significant pleural or pericardial effusion is present. Hepatobiliary: There is diffuse fatty infiltration of the liver. Homogeneous hyperdense material is present in the gallbladder. Common bile duct is normal. No focal hepatic lesions are present. Pancreas: Unremarkable. No pancreatic ductal dilatation or surrounding inflammatory changes. Spleen: Normal in size without focal abnormality. Adrenals/Urinary Tract: Adrenal glands are normal bilaterally. Exophytic cyst of the left kidney  are again noted. There is no stone or mass lesion. Ureters are within normal limits. Posterior bladder stone the left is stable. Is fat in the bladder wall anteriorly, stable. No other discrete lesions are present. Stomach/Bowel: Stomach and duodenum are within normal limits. Small bowel is unremarkable. Terminal ileum is within normal limits. Appendectomy is noted. Ascending and proximal transverse colon are mostly collapsed. Transverse and descending colon are within normal limits. Sigmoid colon is unremarkable. Vascular/Lymphatic: Atherosclerotic calcifications are present in the aorta without aneurysm. No significant adenopathy is present. Reproductive: Prostate is mildly enlarged. No discrete lesions are present. Other: No abdominal wall hernia or abnormality. No abdominopelvic ascites. Musculoskeletal: Mild degenerative changes are noted in the lower lumbar spine. Degenerative changes are noted at the SI joints. Hips are within normal limits. IMPRESSION: 1. No acute or focal lesion to explain the patient's abdominal pain. 2. Extensive hepatic steatosis. 3.  Aortic Atherosclerosis (ICD10-I70.0). 4. Coronary artery disease. 5. Degenerative changes in the lower lumbar spine. Electronically Signed   By: CSan MorelleM.D.   On: 12/02/2018 13:27   [place]Abdomen Limited Ruq  Result Date: 12/02/2018 CLINICAL DATA:  58year old male with elevated bilirubin. EXAM: ULTRASOUND ABDOMEN LIMITED RIGHT UPPER QUADRANT COMPARISON:  CT of the abdomen pelvis dated 12/02/2018 FINDINGS: Gallbladder: There is sludge in the gallbladder. No gallbladder wall thickening or pericholecystic fluid. Negative sonographic Murphy's sign. Common bile duct: Diameter: 8 mm Liver: The diffuse increased liver echogenicity most consistent with fatty infiltration. Superimposed fibrosis or inflammation is not excluded. Portal vein is patent on color Doppler imaging with normal direction of blood flow towards the liver. Other: None.  IMPRESSION: 1. Gallbladder sludge. No sonographic evidence of acute cholecystitis. 2. Fatty liver. Electronically Signed   By: Anner Crete M.D.   On: 12/02/2018 23:31    Anti-infectives: Anti-infectives (From admission, onward)   None       Assessment/Plan CAD s/p STEMI in 2018 with LCx PCI and recent PCI to mid RCA 05/2018 on plavix (last dose 8/7) Chronic combined systolic and diastolic congestive heart failure  Asthma HTN AKI on CKD stage III HLD Hypothyroidism Thombocytopenia Elevated troponin - trending down  Chest pain Nausea and vomiting Elevated LFTs Elevated lipase - With elevated bilirubin and lipase, patient may have passed a stone and caused mild pancreatitis. Will order MRCP to evaluate for possible choledocholithiasis. Repeat CMP, lipase, and CBC in AM. May require laparoscopic cholecystectomy vs percutaneous cholecystostomy tube placement this admission. Please continue to hold plavix.  ID - none FEN - NPO VTE - SCDs, per primary, no chemical DVT prophylaxis due to thrombocytopenia Foley - none Follow up - TBD   LOS: 1 day    Wellington [place] , [facility] 12/03/2018, 11:07 AM Pager: [phone] Mon-Thurs 7:00 am-4:30 pm Fri 7:00 am -11:30 AM Sat-Sun 7:00 am-11:30 am

## 2018-12-03 NOTE — Progress Notes (Signed)
CRITICAL VALUE ALERT  Critical Value:  Acetone 0.013  Date & Time Notied:  12/03/18 at 1445  Provider Notified: Ghimire  Orders Received/Actions taken: Pending

## 2018-12-04 DIAGNOSIS — I10 Essential (primary) hypertension: Secondary | ICD-10-CM

## 2018-12-04 DIAGNOSIS — E785 Hyperlipidemia, unspecified: Secondary | ICD-10-CM

## 2018-12-04 DIAGNOSIS — Z01810 Encounter for preprocedural cardiovascular examination: Secondary | ICD-10-CM

## 2018-12-04 LAB — COMPREHENSIVE METABOLIC PANEL
ALT: 57 U/L — ABNORMAL HIGH (ref 0–44)
AST: 137 U/L — ABNORMAL HIGH (ref 15–41)
Albumin: 3.3 g/dL — ABNORMAL LOW (ref 3.5–5.0)
Alkaline Phosphatase: 60 U/L (ref 38–126)
Anion gap: 17 — ABNORMAL HIGH (ref 5–15)
BUN: 23 mg/dL — ABNORMAL HIGH (ref 6–20)
CO2: 19 mmol/L — ABNORMAL LOW (ref 22–32)
Calcium: 9.4 mg/dL (ref 8.9–10.3)
Chloride: 104 mmol/L (ref 98–111)
Creatinine, Ser: 1.22 mg/dL (ref 0.61–1.24)
GFR calc Af Amer: >60 mL/min (ref >60)
GFR calc non Af Amer: >60 mL/min (ref >60)
Glucose, Bld: 121 mg/dL — ABNORMAL HIGH (ref 70–99)
Potassium: 3.6 mmol/L (ref 3.5–5.1)
Sodium: 140 mmol/L (ref 135–145)
Total Bilirubin: 2.7 mg/dL — ABNORMAL HIGH (ref 0.3–1.2)
Total Protein: 7 g/dL (ref 6.5–8.1)

## 2018-12-04 LAB — CBC
HCT: 32.9 % — ABNORMAL LOW (ref 39.0–52.0)
Hemoglobin: 11.7 g/dL — ABNORMAL LOW (ref 13.0–17.0)
MCH: 34.5 pg — ABNORMAL HIGH (ref 26.0–34.0)
MCHC: 35.6 g/dL (ref 30.0–36.0)
MCV: 97.1 fL (ref 80.0–100.0)
Platelets: 62 10*3/uL — ABNORMAL LOW (ref 150–400)
RBC: 3.39 MIL/uL — ABNORMAL LOW (ref 4.22–5.81)
RDW: 15.6 % — ABNORMAL HIGH (ref 11.5–15.5)
WBC: 7.2 10*3/uL (ref 4.0–10.5)
nRBC: 0 % (ref 0.0–0.2)

## 2018-12-04 LAB — MAGNESIUM: Magnesium: 1.9 mg/dL (ref 1.7–2.4)

## 2018-12-04 LAB — LIPASE, BLOOD: Lipase: 173 U/L — ABNORMAL HIGH (ref 11–51)

## 2018-12-04 MED ORDER — CALCIUM CARBONATE ANTACID 500 MG PO CHEW
1.0000 | CHEWABLE_TABLET | Freq: Three times a day (TID) | ORAL | Status: DC | PRN
  Administered 2018-12-04: 200 mg via ORAL
  Filled 2018-12-04: qty 1

## 2018-12-04 MED ORDER — ENSURE ENLIVE PO LIQD
237.0000 mL | Freq: Two times a day (BID) | ORAL | Status: DC
  Administered 2018-12-06 – 2018-12-08 (×4): 237 mL via ORAL

## 2018-12-04 NOTE — Progress Notes (Signed)
[facility] Progress Note     Subjective: CC-  Comfortable this morning. Abdominal pain somewhat improved. States that he did have 1 episodes of increased epigastric pain and n/v after eating some ice last night, but that it a little better this AM. BM yesterday. Lipase and LFTs trending down. MRCP without signs of choledocholithiasis.  Objective: Vital signs in last 24 hours: Temp:  [98 F (36.7 C)-99.1 F (37.3 C)] 99 F (37.2 C) (08/12 0559) Pulse Rate:  [79-99] 79 (08/12 0820) Resp:  [14-22] 14 (08/12 0559) BP: (120-141)/(87-103) 132/87 (08/12 0820) SpO2:  [96 %-100 %] 100 % (08/12 0559) Last BM Date: 12/03/18  Intake/Output from previous day: No intake/output data recorded. Intake/Output this shift: No intake/output data recorded.  PE: Gen:  Alert, NAD, pleasant HEENT: EOM's intact, pupils equal and round Pulm:  Rate and effort normal Abd: well healed RLQ and midlineincisions, soft, nondistended, mild epigastric TTP without rebound or guarding, +BS, no masses or organomegaly. Soft/reducible umbilical hernia Ext:  No BLE edema Skin: no rashes noted, warm and dry  Lab Results:  Recent Labs    12/03/18 0244 12/04/18 0210  WBC 5.8 7.2  HGB 12.7* 11.7*  HCT 36.2* 32.9*  PLT 47* 62*   BMET Recent Labs    12/03/18 0704 12/04/18 0210  NA 136 140  K 3.9 3.6  CL 99 104  CO2 17* 19*  GLUCOSE 141* 121*  BUN 24* 23*  CREATININE 1.33* 1.22  CALCIUM 9.6 9.4   PT/INR Recent Labs    12/03/18 0244  LABPROT 15.1  INR 1.2   CMP     Component Value Date/Time   NA 140 12/04/2018 0210   NA 141 06/07/2018 1443   K 3.6 12/04/2018 0210   CL 104 12/04/2018 0210   CO2 19 (L) 12/04/2018 0210   GLUCOSE 121 (H) 12/04/2018 0210   BUN 23 (H) 12/04/2018 0210   BUN 23 06/07/2018 1443   CREATININE 1.22 12/04/2018 0210   CALCIUM 9.4 12/04/2018 0210   PROT 7.0 12/04/2018 0210   PROT 7.3 05/29/2017 0915   ALBUMIN 3.3 (L) 12/04/2018 0210   ALBUMIN 4.4  05/29/2017 0915   AST 137 (H) 12/04/2018 0210   ALT 57 (H) 12/04/2018 0210   ALKPHOS 60 12/04/2018 0210   BILITOT 2.7 (H) 12/04/2018 0210   BILITOT 0.3 05/29/2017 0915   GFRNONAA >60 12/04/2018 0210   GFRAA >60 12/04/2018 0210   Lipase     Component Value Date/Time   LIPASE 173 (H) 12/04/2018 0210       Studies/Results: Dg Chest 2 View  Result Date: 12/02/2018 CLINICAL DATA:  Shortness of breath EXAM: CHEST - 2 VIEW COMPARISON:  CTA chest dated 10/29/2018. Partial comparison to CT abdomen/pelvis dated 12/02/2018 at 0111 hours FINDINGS: Mild left lower lobe opacity, new from recent CT abdomen/pelvis, favoring atelectasis. Right lung is clear, noting eventration right hemidiaphragm. No pleural effusion or pneumothorax. Cardiomegaly. Visualized osseous structures are within normal limits. IMPRESSION: Mild left lower lobe opacity, new from recent CT abdomen/pelvis earlier today, favoring atelectasis. Electronically Signed   By: Julian Hy M.D.   On: 12/02/2018 23:15   Ct Abdomen Pelvis W Contrast  Result Date: 12/02/2018 CLINICAL DATA:  Pain. Acute. Generalized. Personal history tubular adenoma the colon. EXAM: CT ABDOMEN AND PELVIS WITH CONTRAST TECHNIQUE: Multidetector CT imaging of the abdomen and pelvis was performed using the standard protocol following bolus administration of intravenous contrast. CONTRAST:  170mL OMNIPAQUE IOHEXOL 300 MG/ML  SOLN COMPARISON:  CT of the abdomen and pelvis 10/29/2018 FINDINGS: Lower chest: The lung bases are clear without focal nodule, mass, or airspace disease. Heart size is normal. Coronary artery calcifications are again noted. No significant pleural or pericardial effusion is present. Hepatobiliary: There is diffuse fatty infiltration of the liver. Homogeneous hyperdense material is present in the gallbladder. Common bile duct is normal. No focal hepatic lesions are present. Pancreas: Unremarkable. No pancreatic ductal dilatation or surrounding  inflammatory changes. Spleen: Normal in size without focal abnormality. Adrenals/Urinary Tract: Adrenal glands are normal bilaterally. Exophytic cyst of the left kidney are again noted. There is no stone or mass lesion. Ureters are within normal limits. Posterior bladder stone the left is stable. Is fat in the bladder wall anteriorly, stable. No other discrete lesions are present. Stomach/Bowel: Stomach and duodenum are within normal limits. Small bowel is unremarkable. Terminal ileum is within normal limits. Appendectomy is noted. Ascending and proximal transverse colon are mostly collapsed. Transverse and descending colon are within normal limits. Sigmoid colon is unremarkable. Vascular/Lymphatic: Atherosclerotic calcifications are present in the aorta without aneurysm. No significant adenopathy is present. Reproductive: Prostate is mildly enlarged. No discrete lesions are present. Other: No abdominal wall hernia or abnormality. No abdominopelvic ascites. Musculoskeletal: Mild degenerative changes are noted in the lower lumbar spine. Degenerative changes are noted at the SI joints. Hips are within normal limits. IMPRESSION: 1. No acute or focal lesion to explain the patient's abdominal pain. 2. Extensive hepatic steatosis. 3.  Aortic Atherosclerosis (ICD10-I70.0). 4. Coronary artery disease. 5. Degenerative changes in the lower lumbar spine. Electronically Signed   By: San Morelle M.D.   On: 12/02/2018 13:27   Mr 3d Recon At Scanner  Result Date: 12/03/2018 CLINICAL DATA:  Abnormal LFTs EXAM: MRI ABDOMEN WITHOUT AND WITH CONTRAST (INCLUDING MRCP) TECHNIQUE: Multiplanar multisequence MR imaging of the abdomen was performed both before and after the administration of intravenous contrast. Heavily T2-weighted images of the biliary and pancreatic ducts were obtained, and three-dimensional MRCP images were rendered by post processing. CONTRAST:  10 mL Gadovist IV COMPARISON:  CT abdomen/pelvis dated  12/02/2018. MRI abdomen dated 10/30/2018. FINDINGS: Motion degraded images. Lower chest: Lung bases are clear. Hepatobiliary: Severe hepatic steatosis. No focal hepatic lesions, noting that the dynamic postcontrast imaging is severely motion degraded. Gallbladder is unremarkable. No intrahepatic or extrahepatic ductal dilatation. Pancreas:  Within normal limits. Spleen:  Within normal limits. Adrenals/Urinary Tract:  Adrenal glands are within normal limits. Two left renal cysts measuring up to 2.8 cm, simple. Right kidney is within normal limits. No hydronephrosis. Stomach/Bowel: Stomach is within normal limits. Visualized bowel is unremarkable. Vascular/Lymphatic:  No evidence of abdominal aortic aneurysm. No suspicious abdominal lymphadenopathy. Other:  No abdominal ascites. Musculoskeletal: No focal osseous lesions. IMPRESSION: Motion degraded images. Severe hepatic steatosis. Otherwise unremarkable MRI abdomen. Electronically Signed   By: Julian Hy M.D.   On: 12/03/2018 20:54   Mr Abdomen Mrcp Moise Boring Contast  Result Date: 12/03/2018 CLINICAL DATA:  Abnormal LFTs EXAM: MRI ABDOMEN WITHOUT AND WITH CONTRAST (INCLUDING MRCP) TECHNIQUE: Multiplanar multisequence MR imaging of the abdomen was performed both before and after the administration of intravenous contrast. Heavily T2-weighted images of the biliary and pancreatic ducts were obtained, and three-dimensional MRCP images were rendered by post processing. CONTRAST:  10 mL Gadovist IV COMPARISON:  CT abdomen/pelvis dated 12/02/2018. MRI abdomen dated 10/30/2018. FINDINGS: Motion degraded images. Lower chest: Lung bases are clear. Hepatobiliary: Severe hepatic steatosis. No focal hepatic lesions, noting that the dynamic postcontrast imaging  is severely motion degraded. Gallbladder is unremarkable. No intrahepatic or extrahepatic ductal dilatation. Pancreas:  Within normal limits. Spleen:  Within normal limits. Adrenals/Urinary Tract:  Adrenal glands are  within normal limits. Two left renal cysts measuring up to 2.8 cm, simple. Right kidney is within normal limits. No hydronephrosis. Stomach/Bowel: Stomach is within normal limits. Visualized bowel is unremarkable. Vascular/Lymphatic:  No evidence of abdominal aortic aneurysm. No suspicious abdominal lymphadenopathy. Other:  No abdominal ascites. Musculoskeletal: No focal osseous lesions. IMPRESSION: Motion degraded images. Severe hepatic steatosis. Otherwise unremarkable MRI abdomen. Electronically Signed   By: Julian Hy M.D.   On: 12/03/2018 20:54   US Abdomen Limited Ruq  Result Date: 12/02/2018 CLINICAL DATA:  58 year old male with elevated bilirubin. EXAM: ULTRASOUND ABDOMEN LIMITED RIGHT UPPER QUADRANT COMPARISON:  CT of the abdomen pelvis dated 12/02/2018 FINDINGS: Gallbladder: There is sludge in the gallbladder. No gallbladder wall thickening or pericholecystic fluid. Negative sonographic Murphy's sign. Common bile duct: Diameter: 8 mm Liver: The diffuse increased liver echogenicity most consistent with fatty infiltration. Superimposed fibrosis or inflammation is not excluded. Portal vein is patent on color Doppler imaging with normal direction of blood flow towards the liver. Other: None. IMPRESSION: 1. Gallbladder sludge. No sonographic evidence of acute cholecystitis. 2. Fatty liver. Electronically Signed   By: Anner Crete M.D.   On: 12/02/2018 23:31    Anti-infectives: Anti-infectives (From admission, onward)   None       Assessment/Plan CADs/p STEMI in 2018 with LCx PCI and recent PCI to mid RCA 05/2018 on plavix (last dose 8/7) Chronic combined systolic and diastolic congestive heart failure  Asthma HTN AKI on CKDstage III HLD Hypothyroidism Umbilical hernia Thombocytopenia Elevated troponin - trending down  Chest/epigastric pain Nausea and vomiting Elevated LFTs Elevated lipase - MRCP negative for choledocholithiasis - lipase and LFTs trending down today  but not yet normalized. AST 137, ALT 57, AP 60, Tbili 2.7, lipase 173 - Will ask cardiology to see today for surgical cardiac clearance. Ok for clear liquids, NPO after midnight. Repeat labs in AM. Please continue to hold plavix.  ID - none. WBC 7.2, afebrile FEN - IVF @ 150cc/hr, CLD, NPO after MN VTE - SCDs, per primary, no chemical DVT prophylaxis due to thrombocytopenia Foley - none Follow up - TBD   LOS: 2 days    Wellington [place] , [facility] 12/04/2018, 9:30 AM Pager: [phone] Mon-Thurs 7:00 am-4:30 pm Fri 7:00 am -11:30 AM Sat-Sun 7:00 am-11:30 am

## 2018-12-04 NOTE — Progress Notes (Addendum)
PROGRESS NOTE    [name]  NTI:[id number] DOB: [birth year] DOA: 12/02/2018 PCP: [name], DO    Brief Narrative:  Patient admitted with very interesting episodic abdominal pain that is associated with elevated LFTs, thrombocytopenia.  58 year old gentleman with history of asthma, TIA, thrombocytopenia, coronary artery disease, hypertension, hyperlipidemia, hypothyroidism, GERD and stage II chronic kidney disease and combined heart failure presented to the emergency room with 2 days of vomiting.  Patient had intermittent periumbilical abdominal pain and epigastric pain, vomiting and unable to eat anything since last 2 days.  Interestingly he was admitted to the hospital multiple times in the past with similar symptoms including 1 about a month ago where he underwent extensive evaluation including MRCP that did not show any biliary obstruction, HIDA scan showed nonvisualized gallbladder.  Was seen by surgery and asked to follow-up outpatient as he had improvement in symptoms.  Patient is poor historian, however he states that he was feeling well until 2 days ago.  He has not taken Plavix since Friday as he continued to have vomiting.  Occasional alcohol.  Denies drugs and UDS was negative. In the emergency room, tachypneic and tachycardic, on room air.  Elevated lipase and mild elevation of transaminases.  Metabolic acidosis with anion gap of 31 and mildly elevated creatinine from baseline.  Platelets 43.  CT scan of the abdomen pelvis as well as right upper quadrant ultrasound was normal.  Assessment & Plan:   Principal Problem:   Intractable vomiting with nausea Active Problems:   Essential hypertension   GERD   Thrombocytopenia (HCC)   Hypercholesteremia   Elevated LFTs   Elevated lipase   Metabolic acidosis  Intractable nausea and vomiting, elevated lipase and LFTs: Chronic and recurrent with severe symptoms on presentation. Symptomatic gallbladder disease?  Passed  biliary stone is also possibility. No evidence of acute cholecystitis.  MRCP negative for any CBD stone, cholelithiasis or cholecystitis.  Continue IV fluids with symptomatic treatment.  Surgery on board and potential plan for cholecystectomy tomorrow.  Abnormal LFTs and thrombocytopenia: Extensively investigated in earlier admission.  Hepatitis panel negative.  Does have fatty infiltration of the liver.  Fairly stable and labs improving.  High anion gap metabolic acidosis: Presentation with bicarb of 8, anion gap 31.  Treated with isotonic fluid with clinical improvement.  No osmolar gap.  No evidence of significantly elevated acetone level in the body.  Negative for ethanol, isopropanol and methanol.  Probably related to starvation ketoacidosis.  Treated with aggressive IV fluids and already improving.  GERD: On PPI continue.  CKD stage III: At baseline.  Continue to watch.  Thrombocytopenia: Acute on chronic.  Interestingly improves with improvement of liver functions in the past.  Will need close monitoring.  Currently 41.  No signs of bleeding.  Coronary artery disease: Cardiac cath on 05/2018.  No new stent placed.  Continue nitrates, beta-blockers, antilipid medications.  Holding aspirin and Plavix in anticipation of procedures.  History of depression: On Prozac, resume.  DVT prophylaxis: SCDs Code Status: Full code Family Communication: None Disposition Plan: Medical telemetry.  Home pending clinical improvement.   Consultants:   General surgery  Procedures:   None  Antimicrobials:   None   Subjective: Patient seen and examined.  He still has some pain which he is pointing towards epigastric and periumbilical region.  3 out of 10.  No other complaint.  He is alert and oriented x4 however he is slow in response.  Objective: Vitals:   12/03/18 1412  12/03/18 2155 12/04/18 0559 12/04/18 0820  BP: (!) 141/103 (!) 139/97 120/87 132/87  Pulse:  99 89 79  Resp: 18 (!) 22 14    Temp: 98 F (36.7 C) 99.1 F (37.3 C) 99 F (37.2 C)   TempSrc: Oral Oral Oral   SpO2: 96%  100%   Weight:      Height:       No intake or output data in the 24 hours ending 12/04/18 1159 Filed Weights   12/02/18 1115 12/02/18 2103  Weight: 99.8 kg 97.5 kg    Examination:  General exam: Appears calm and comfortable, obese Respiratory system: Clear to auscultation. Respiratory effort normal. Cardiovascular system: S1 & S2 heard, RRR. No JVD, murmurs, rubs, gallops or clicks. No pedal edema. Gastrointestinal system: Abdomen is nondistended, soft and mild epigastric and right upper quadrant tenderness. No organomegaly or masses felt. Normal bowel sounds heard. Central nervous system: Alert and oriented. No focal neurological deficits. Extremities: Symmetric 5 x 5 power. Skin: No rashes, lesions or ulcers Psychiatry: Judgement and insight appear poor mood & affect flat   Data Reviewed: I have personally reviewed following labs and imaging studies  CBC: Recent Labs  Lab 12/02/18 1135 12/02/18 1508 12/03/18 0244 12/04/18 0210  WBC 6.4  --  5.8 7.2  NEUTROABS 4.6  --   --   --   HGB 13.1 12.9* 12.7* 11.7*  HCT 38.4* 38.0* 36.2* 32.9*  MCV 97.5  --  97.3 97.1  PLT 43*  --  47* 62*   Basic Metabolic Panel: Recent Labs  Lab 12/02/18 1135 12/02/18 1508 12/02/18 2249 12/03/18 0244 12/03/18 0704 12/04/18 0210  NA 133* 130* 135 133* 136 140  K 3.9 3.8 3.8 3.8 3.9 3.6  CL 94*  --  97* 97* 99 104  CO2 8*  --  15* 14* 17* 19*  GLUCOSE 138*  --  143* 162* 141* 121*  BUN 22*  --  22* 24* 24* 23*  CREATININE 1.50*  --  1.41* 1.39* 1.33* 1.22  CALCIUM 9.7  --  9.6 9.6 9.6 9.4  MG  --   --  1.6*  --   --   --    GFR: Estimated Creatinine Clearance: 80.1 mL/min (by C-G formula based on SCr of 1.22 mg/dL). Liver Function Tests: Recent Labs  Lab 12/02/18 1135 12/03/18 0244 12/04/18 0210  AST 163* 153* 137*  ALT 74* 68* 57*  ALKPHOS 64 63 60  BILITOT 5.1* 3.3* 2.7*    PROT 8.8* 7.9 7.0  ALBUMIN 4.3 3.8 3.3*   Recent Labs  Lab 12/02/18 1135 12/04/18 0210  LIPASE 346* 173*   No results for input(s): AMMONIA in the last 168 hours. Coagulation Profile: Recent Labs  Lab 12/03/18 0244  INR 1.2   Cardiac Enzymes: No results for input(s): CKTOTAL, CKMB, CKMBINDEX, TROPONINI in the last 168 hours. BNP (last 3 results) No results for input(s): PROBNP in the last 8760 hours. HbA1C: No results for input(s): HGBA1C in the last 72 hours. CBG: Recent Labs  Lab 12/02/18 1127  GLUCAP 135*   Lipid Profile: No results for input(s): CHOL, HDL, LDLCALC, TRIG, CHOLHDL, LDLDIRECT in the last 72 hours. Thyroid Function Tests: No results for input(s): TSH, T4TOTAL, FREET4, T3FREE, THYROIDAB in the last 72 hours. Anemia Panel: No results for input(s): VITAMINB12, FOLATE, FERRITIN, TIBC, IRON, RETICCTPCT in the last 72 hours. Sepsis Labs: Recent Labs  Lab 12/02/18 2249  LATICACIDVEN 1.8    Recent Results (from the past  240 hour(s))  SARS Coronavirus 2 [facility] order, Performed in [facility] hospital lab) Nasopharyngeal Nasopharyngeal Swab     Status: None   Collection Time: 12/02/18  3:14 PM   Specimen: Nasopharyngeal Swab  Result Value Ref Range Status   SARS Coronavirus 2 NEGATIVE NEGATIVE Final    Comment: (NOTE) If result is NEGATIVE SARS-CoV-2 target nucleic acids are NOT DETECTED. The SARS-CoV-2 RNA is generally detectable in upper and lower  respiratory specimens during the acute phase of infection. The lowest  concentration of SARS-CoV-2 viral copies this assay can detect is 250  copies / mL. A negative result does not preclude SARS-CoV-2 infection  and should not be used as the sole basis for treatment or other  patient management decisions.  A negative result may occur with  improper specimen collection / handling, submission of specimen other  than nasopharyngeal swab, presence of viral mutation(s) within the  areas targeted by this  assay, and inadequate number of viral copies  (<250 copies / mL). A negative result must be combined with clinical  observations, patient history, and epidemiological information. If result is POSITIVE SARS-CoV-2 target nucleic acids are DETECTED. The SARS-CoV-2 RNA is generally detectable in upper and lower  respiratory specimens dur ing the acute phase of infection.  Positive  results are indicative of active infection with SARS-CoV-2.  Clinical  correlation with patient history and other diagnostic information is  necessary to determine patient infection status.  Positive results do  not rule out bacterial infection or co-infection with other viruses. If result is PRESUMPTIVE POSTIVE SARS-CoV-2 nucleic acids MAY BE PRESENT.   A presumptive positive result was obtained on the submitted specimen  and confirmed on repeat testing.  While 2019 novel coronavirus  (SARS-CoV-2) nucleic acids may be present in the submitted sample  additional confirmatory testing may be necessary for epidemiological  and / or clinical management purposes  to differentiate between  SARS-CoV-2 and other Sarbecovirus currently known to infect humans.  If clinically indicated additional testing with an alternate test  methodology [phone]) is advised. The SARS-CoV-2 RNA is generally  detectable in upper and lower respiratory sp ecimens during the acute  phase of infection. The expected result is Negative. Fact Sheet for Patients:  StrictlyIdeas.no Fact Sheet for Healthcare Providers: BankingDealers.co.za This test is not yet approved or cleared by the [place] FDA and has been authorized for detection and/or diagnosis of SARS-CoV-2 by FDA under an Emergency Use Authorization (EUA).  This EUA will remain in effect (meaning this test can be used) for the duration of the COVID-19 declaration under Section 564(b)(1) of the Act, 21 U.S.C. section 360bbb-3(b)(1),  unless the authorization is terminated or revoked sooner. Performed at [facility], [address], [place], [place] [address]          Radiology Studies: Dg Chest 2 View  Result Date: 12/02/2018 CLINICAL DATA:  Shortness of breath EXAM: CHEST - 2 VIEW COMPARISON:  CTA chest dated 10/29/2018. Partial comparison to CT abdomen/pelvis dated 12/02/2018 at 0111 hours FINDINGS: Mild left lower lobe opacity, new from recent CT abdomen/pelvis, favoring atelectasis. Right lung is clear, noting eventration right hemidiaphragm. No pleural effusion or pneumothorax. Cardiomegaly. Visualized osseous structures are within normal limits. IMPRESSION: Mild left lower lobe opacity, new from recent CT abdomen/pelvis earlier today, favoring atelectasis. Electronically Signed   By: Julian Hy M.D.   On: 12/02/2018 23:15   Ct Abdomen Pelvis W Contrast  Result Date: 12/02/2018 CLINICAL DATA:  Pain.  Acute. Generalized. Personal history tubular adenoma the colon. EXAM: CT ABDOMEN AND PELVIS WITH CONTRAST TECHNIQUE: Multidetector CT imaging of the abdomen and pelvis was performed using the standard protocol following bolus administration of intravenous contrast. CONTRAST:  126mL OMNIPAQUE IOHEXOL 300 MG/ML  SOLN COMPARISON:  CT of the abdomen and pelvis 10/29/2018 FINDINGS: Lower chest: The lung bases are clear without focal nodule, mass, or airspace disease. Heart size is normal. Coronary artery calcifications are again noted. No significant pleural or pericardial effusion is present. Hepatobiliary: There is diffuse fatty infiltration of the liver. Homogeneous hyperdense material is present in the gallbladder. Common bile duct is normal. No focal hepatic lesions are present. Pancreas: Unremarkable. No pancreatic ductal dilatation or surrounding inflammatory changes. Spleen: Normal in size without focal abnormality. Adrenals/Urinary Tract: Adrenal glands are normal bilaterally. Exophytic cyst of the left  kidney are again noted. There is no stone or mass lesion. Ureters are within normal limits. Posterior bladder stone the left is stable. Is fat in the bladder wall anteriorly, stable. No other discrete lesions are present. Stomach/Bowel: Stomach and duodenum are within normal limits. Small bowel is unremarkable. Terminal ileum is within normal limits. Appendectomy is noted. Ascending and proximal transverse colon are mostly collapsed. Transverse and descending colon are within normal limits. Sigmoid colon is unremarkable. Vascular/Lymphatic: Atherosclerotic calcifications are present in the aorta without aneurysm. No significant adenopathy is present. Reproductive: Prostate is mildly enlarged. No discrete lesions are present. Other: No abdominal wall hernia or abnormality. No abdominopelvic ascites. Musculoskeletal: Mild degenerative changes are noted in the lower lumbar spine. Degenerative changes are noted at the SI joints. Hips are within normal limits. IMPRESSION: 1. No acute or focal lesion to explain the patient's abdominal pain. 2. Extensive hepatic steatosis. 3.  Aortic Atherosclerosis (ICD10-I70.0). 4. Coronary artery disease. 5. Degenerative changes in the lower lumbar spine. Electronically Signed   By: San Morelle M.D.   On: 12/02/2018 13:27   Mr 3d Recon At Scanner  Result Date: 12/03/2018 CLINICAL DATA:  Abnormal LFTs EXAM: MRI ABDOMEN WITHOUT AND WITH CONTRAST (INCLUDING MRCP) TECHNIQUE: Multiplanar multisequence MR imaging of the abdomen was performed both before and after the administration of intravenous contrast. Heavily T2-weighted images of the biliary and pancreatic ducts were obtained, and three-dimensional MRCP images were rendered by post processing. CONTRAST:  10 mL Gadovist IV COMPARISON:  CT abdomen/pelvis dated 12/02/2018. MRI abdomen dated 10/30/2018. FINDINGS: Motion degraded images. Lower chest: Lung bases are clear. Hepatobiliary: Severe hepatic steatosis. No focal hepatic  lesions, noting that the dynamic postcontrast imaging is severely motion degraded. Gallbladder is unremarkable. No intrahepatic or extrahepatic ductal dilatation. Pancreas:  Within normal limits. Spleen:  Within normal limits. Adrenals/Urinary Tract:  Adrenal glands are within normal limits. Two left renal cysts measuring up to 2.8 cm, simple. Right kidney is within normal limits. No hydronephrosis. Stomach/Bowel: Stomach is within normal limits. Visualized bowel is unremarkable. Vascular/Lymphatic:  No evidence of abdominal aortic aneurysm. No suspicious abdominal lymphadenopathy. Other:  No abdominal ascites. Musculoskeletal: No focal osseous lesions. IMPRESSION: Motion degraded images. Severe hepatic steatosis. Otherwise unremarkable MRI abdomen. Electronically Signed   By: Julian Hy M.D.   On: 12/03/2018 20:54   Mr Abdomen Mrcp Moise Boring Contast  Result Date: 12/03/2018 CLINICAL DATA:  Abnormal LFTs EXAM: MRI ABDOMEN WITHOUT AND WITH CONTRAST (INCLUDING MRCP) TECHNIQUE: Multiplanar multisequence MR imaging of the abdomen was performed both before and after the administration of intravenous contrast. Heavily T2-weighted images of the biliary and pancreatic ducts were obtained, and three-dimensional  MRCP images were rendered by post processing. CONTRAST:  10 mL Gadovist IV COMPARISON:  CT abdomen/pelvis dated 12/02/2018. MRI abdomen dated 10/30/2018. FINDINGS: Motion degraded images. Lower chest: Lung bases are clear. Hepatobiliary: Severe hepatic steatosis. No focal hepatic lesions, noting that the dynamic postcontrast imaging is severely motion degraded. Gallbladder is unremarkable. No intrahepatic or extrahepatic ductal dilatation. Pancreas:  Within normal limits. Spleen:  Within normal limits. Adrenals/Urinary Tract:  Adrenal glands are within normal limits. Two left renal cysts measuring up to 2.8 cm, simple. Right kidney is within normal limits. No hydronephrosis. Stomach/Bowel: Stomach is within  normal limits. Visualized bowel is unremarkable. Vascular/Lymphatic:  No evidence of abdominal aortic aneurysm. No suspicious abdominal lymphadenopathy. Other:  No abdominal ascites. Musculoskeletal: No focal osseous lesions. IMPRESSION: Motion degraded images. Severe hepatic steatosis. Otherwise unremarkable MRI abdomen. Electronically Signed   By: Julian Hy M.D.   On: 12/03/2018 20:54   US Abdomen Limited Ruq  Result Date: 12/02/2018 CLINICAL DATA:  58 year old male with elevated bilirubin. EXAM: ULTRASOUND ABDOMEN LIMITED RIGHT UPPER QUADRANT COMPARISON:  CT of the abdomen pelvis dated 12/02/2018 FINDINGS: Gallbladder: There is sludge in the gallbladder. No gallbladder wall thickening or pericholecystic fluid. Negative sonographic Murphy's sign. Common bile duct: Diameter: 8 mm Liver: The diffuse increased liver echogenicity most consistent with fatty infiltration. Superimposed fibrosis or inflammation is not excluded. Portal vein is patent on color Doppler imaging with normal direction of blood flow towards the liver. Other: None. IMPRESSION: 1. Gallbladder sludge. No sonographic evidence of acute cholecystitis. 2. Fatty liver. Electronically Signed   By: Anner Crete M.D.   On: 12/02/2018 23:31        Scheduled Meds:  amLODipine  10 mg Oral Daily   ezetimibe  10 mg Oral Daily   FLUoxetine  20 mg Oral Daily   isosorbide mononitrate  30 mg Oral Daily   loratadine  10 mg Oral QHS   metoprolol succinate  50 mg Oral Daily   Continuous Infusions:  sodium chloride 150 mL/hr at 12/03/18 1419   famotidine (PEPCID) IV 20 mg (12/04/18 0855)     LOS: 2 days    Time spent: 30 minutes.  Darliss Cheney, MD [facility] Pager [phone]  If 7PM-7AM, please contact night-coverage [email or web address] Password [facility] 12/04/2018, 11:59 AM

## 2018-12-04 NOTE — Consult Note (Signed)
Cardiology Consultation:   Patient ID: [name] MRN: [id number]; DOB: 1961/04/03  Admit date: 12/02/2018 Date of Consult: 12/04/2018  Primary Care Provider: [name], DO Primary Cardiologist: Ena Dawley, MD  Primary Electrophysiologist:  None    Patient Profile:   [name] is a 58 y.o. male with a hx of CAD, combined systolic and diastolic heart failure, hypertension, hyperlipidemia, chronic chest pain, hypothyroidism and chronic nausea who is being seen today for preoperative clearance at the request of Dr. Doristine Bosworth.  History of Present Illness:   [name] is a 58 year old male with past medical history of CAD, combined systolic and diastolic heart failure with improved EF, hypertension, hyperlipidemia, chronic chest pain, and chronic nausea.  Remote cardiac catheterization in 2010 showed nonobstructive disease.  He had a MRI in August 2018, and had a DES/PTCA to mid left circumflex.  Post-cath, he had chronic atypical chest pain.  Relook cath showed no change anatomy.  In February 2020, he had abnormal stress test and underwent repeat cardiac catheterization which showed significant RCA disease, this was treated with another DES.  He also had a 70% OM 2 lesion that was treated medically.  Echocardiogram showed improved EF of 60 to 65% by February 2020.  Due to recurrent chest pain, he was readmitted in April 2020 and a relook cath showed patent stents, 70% distal left circumflex artery lesion that was stable when compared to the previous cath, 30% proximal LAD lesion.  No culprit lesion was identified to explain his chest pain.  Urine drug test was negative.    He did have a episode of syncope related to hypotension as result of possibly taking an extra blood pressure pill in the morning.  His blood pressure was in the 80s.  Patient was admitted again in July 2020 with recurrent chest pain and dyspnea on exertion.  Cardiology service evaluated the patient at  that time who felt that the chest pain is likely noncardiac and further GI evaluation.  He also had elevated LFT.  Gallbladder ultrasound was unremarkable.  CT of abdomen pelvis showed no acute abnormality, severe hepatic steatosis, no biliary duct dilatation.  MRCP demonstrated severe diffuse hepatic steatosis, no cholelithiasis, no evidence of choledocholithiasis.  Surgery was consulted at the time who recommended outpatient reevaluation to determine when his gallbladder should be removed.  Since discharge, he has seen by Dr. Meda Coffee via telehealth visit on 11/15/2018 at which time he was doing okay.  Cardiac MRI obtained on the same day showed no evidence of pericardial effusion or pericarditis, EF was normal at 62%, mild hypokinesis in the mid inferolateral wall, 25 to 50% endocardial late gadolinium enhancement in the basal and mid inferolateral wall.  No further work-up is recommended at the time.  Patient returned to the hospital 12/02/2018 with persistent nausea and vomiting since last Friday.  He says he took the last dose of aspirin and Plavix last Friday and he has not been able to keep anything down.  He was also instructed by one of his providers to stop the dual antiplatelet therapy anticipation of the surgery. He says the last episode of chest discomfort was during the previous admission in July.  He has not had any further chest discomfort since.  He does continue to complain of dyspnea on exertion however no lower extremity edema, orthopnea or PND.  Liver function test continue to be elevated during this admission.  Repeat CT of the abdomen and pelvis showed no acute lesion to explain his symptom,  extensive hepatic steatosis.  Blood toxicity screen is negative.  Repeat abdominal ultrasound obtained on 8/10 showed gallbladder sludge but no sonographic evidence of acute cholecystitis.  MRI of the abdomen is also negative.  Patient is planning to proceed with gallbladder surgery tomorrow.  Cardiology  has been consulted for preop clearance.    Heart Pathway Score:     Past Medical History:  Diagnosis Date   BPH (benign prostatic hyperplasia)    CAD (coronary artery disease)    a. NSTEMI troponin >65 with cath 11/2016 S/p PTCA & DES to mid circumflex. b. 05/2018 s/p DES to [facility].   Chronic chest pain    Chronic combined systolic and diastolic CHF (congestive heart failure) ([place])    a. EF 45% in 2018. b. EF 55-60% by echo 01/2017. c. EF 48% by nuc 07/2017.   CKD (chronic kidney disease), stage II    Cluster headache    hx of   Complication of anesthesia    difficult waking up"   Diverticulosis    Drug abuse (HCC)    hx of   GERD (gastroesophageal reflux disease)    Hiatal hernia    Hyperlipidemia    Hypertension    Hypothyroidism    Nontoxic multinodular goiter    NSTEMI (non-ST elevated myocardial infarction) ([place])    Perforated nasal septum    Pulmonary nodule --- CT 10/19/2014: Nodule is stable, no further routine x-rays 01/06/2009   Thrombocytopenia (HCC)    TIA (transient ischemic attack)    Trigeminal neuralgia    Tubular adenoma of colon 12/2015   Unspecified asthma(493.90)     Past Surgical History:  Procedure Laterality Date   APPENDECTOMY     CARDIAC CATHETERIZATION N/A 11/25/2014   Procedure: Left Heart Cath and Coronary Angiography;  Surgeon: Belva Crome, MD;  Location: [place] [facility];  Service: Cardiovascular;  Laterality: N/A;   CARDIAC CATHETERIZATION  12/08/2008   Archie Endo 08/24/2010   COLECTOMY  ~ 1976   "I had a blockage"   CORONARY STENT INTERVENTION N/A 11/27/2016   Procedure: CORONARY STENT INTERVENTION;  Surgeon: Belva Crome, MD;  Location: [place] [facility];  Service: Cardiovascular;  Laterality: N/A;   CORONARY STENT INTERVENTION N/A 06/10/2018   Procedure: CORONARY STENT INTERVENTION;  Surgeon: Nelva Bush, MD;  Location: [place] [facility];  Service: Cardiovascular;  Laterality: N/A;   CORONARY STENT  PLACEMENT  11/27/2016   STENT XIENCE ALPINE RX 3.0X15 drug eluting stent was successfully placed   CYST EXCISION Left 10/31/2016   Jaw   DESTRUCTION TRIGEMINAL NERVE VIA NEUROLYTIC AGENT  x 3 , R side last ~2010   KNEE ARTHROSCOPY Right 06/26/2000   Arthroscopy followed by open lateral release/notes 09/06/2010   LEFT HEART CATH AND CORONARY ANGIOGRAPHY N/A 11/27/2016   Procedure: LEFT HEART CATH AND CORONARY ANGIOGRAPHY;  Surgeon: Belva Crome, MD;  Location: [place] [facility];  Service: Cardiovascular;  Laterality: N/A;   LEFT HEART CATH AND CORONARY ANGIOGRAPHY N/A 12/07/2016   Procedure: LEFT HEART CATH AND CORONARY ANGIOGRAPHY;  Surgeon: Nelva Bush, MD;  Location: [place] [facility];  Service: Cardiovascular;  Laterality: N/A;   LEFT HEART CATH AND CORONARY ANGIOGRAPHY N/A 06/10/2018   Procedure: LEFT HEART CATH AND CORONARY ANGIOGRAPHY;  Surgeon: Nelva Bush, MD;  Location: [place] [facility];  Service: Cardiovascular;  Laterality: N/A;   LEFT HEART CATH AND CORONARY ANGIOGRAPHY N/A 07/24/2018   Procedure: LEFT HEART CATH AND CORONARY ANGIOGRAPHY;  Surgeon: Belva Crome,  MD;  Location: [place] [facility];  Service: Cardiovascular;  Laterality: N/A;   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 Ardine Bjork 09/06/2010   SHOULDER HEMI-ARTHROPLASTY Right 05/26/2010   revision/notes 05/27/2010     Home Medications:  Prior to Admission medications   Medication Sig Start Date End Date Taking? Authorizing Provider  amLODipine (NORVASC) 10 MG tablet TAKE ONE TABLET BY MOUTH DAILY 11/15/18  Yes [name], DO  aspirin EC 81 MG tablet Take 81 mg by mouth daily.   Yes [provider]  calcium carbonate (TUMS EX) 750 MG chewable tablet Chew 2 tablets by mouth as needed for heartburn.   Yes [provider]  ezetimibe (ZETIA) 10 MG tablet Take 1 tablet (10 mg total) by mouth daily. 05/27/18  Yes [name], DO  fluticasone  (FLONASE) 50 MCG/ACT nasal spray Place 2 sprays into both nostrils daily as needed for allergies or rhinitis.   Yes [provider]  isosorbide mononitrate (IMDUR) 30 MG 24 hr tablet Take 1 tablet (30 mg total) by mouth daily. 11/15/18  Yes Dorothy Spark, MD  lansoprazole (PREVACID) 30 MG capsule TAKE ONE CAPSULE BY MOUTH TWICE A DAY BEFORE A MEAL Patient taking differently: Take 30 mg by mouth 2 (two) times daily before a meal.  11/18/18  Yes Wendling, Crosby Oyster, DO  levocetirizine (XYZAL) 5 MG tablet Take 1 tablet (5 mg total) by mouth every evening. 05/27/18  Yes [name], DO  losartan (COZAAR) 100 MG tablet Take 100 mg by mouth daily.   Yes [provider]  metoprolol succinate (TOPROL-XL) 50 MG 24 hr tablet Take 1 tablet (50 mg total) by mouth daily. Take with or immediately following a meal. 10/09/18  Yes Wendling, Crosby Oyster, DO  nitroGLYCERIN (NITROSTAT) 0.4 MG SL tablet Place 1 tablet (0.4 mg total) under the tongue every 5 (five) minutes as needed for chest pain. 05/28/18  Yes Imogene Burn, PA-C  ondansetron (ZOFRAN) 4 MG tablet Take 1 tablet (4 mg total) by mouth every 6 (six) hours as needed for nausea. 11/01/18  Yes Mikhail, [place], DO  Polyethyl Glycol-Propyl Glycol (SYSTANE OP) Place 1 drop into both eyes every 3 (three) hours as needed (dry eyes).    Yes [provider]  promethazine (PHENERGAN) 12.5 MG tablet Take 1 tablet (12.5 mg total) by mouth every 6 (six) hours as needed for refractory nausea / vomiting. 11/01/18  Yes Mikhail, Velta Addison, DO  traZODone (DESYREL) 50 MG tablet Take 25-50 mg by mouth at bedtime as needed for sleep.   Yes [provider]  clopidogrel (PLAVIX) 75 MG tablet Take 75 mg by mouth daily.    [provider]  Evolocumab (REPATHA SURECLICK) 193 MG/ML SOAJ Inject 140 mg into the skin every 14 (fourteen) days. 09/12/18   Dorothy Spark, MD  FLUoxetine (PROZAC) 20 MG tablet Take 20 mg by mouth  daily.    [provider]    Inpatient Medications: Scheduled Meds:  amLODipine  10 mg Oral Daily   ezetimibe  10 mg Oral Daily   feeding supplement (ENSURE ENLIVE)  237 mL Oral BID BM   FLUoxetine  20 mg Oral Daily   isosorbide mononitrate  30 mg Oral Daily   loratadine  10 mg Oral QHS   metoprolol succinate  50 mg Oral Daily   Continuous Infusions:  sodium chloride 150 mL/hr at 12/03/18 1419   famotidine (PEPCID) IV 20 mg (12/04/18 0855)   PRN  Meds: HYDROmorphone (DILAUDID) injection, ipratropium-albuterol, metoprolol tartrate, nitroGLYCERIN, promethazine, traZODone  Allergies:    Allergies  Allergen Reactions   Tramadol Palpitations    Social History:   Social History   Socioeconomic History   Marital status: Married    Spouse name: Not on file   Number of children: 2   Years of education: Not on file   Highest education level: Not on file  Occupational History   Occupation: Merchandiser, retail [employer] resource strain: Not hard at all   Food insecurity    Worry: Never true    Inability: Never true   Transportation needs    Medical: No    Non-medical: No  Tobacco Use   Smoking status: Former Smoker    Packs/day: 0.25    Years: 12.00    Pack years: 3.00    Types: Cigarettes    Quit date: 11/15/2016    Years since quitting: 2.0   Smokeless tobacco: Never Used  Substance and Sexual Activity   Alcohol use: Yes    Alcohol/week: 1.0 standard drinks    Types: 1 Shots of liquor per week    Comment: occ   Drug use: No   Sexual activity: Yes  Lifestyle   Physical activity    Days per week: 3 days    Minutes per session: 30 min   Stress: Only a little  Relationships   Social connections    Talks on phone: More than three times a week    Gets together: Three times a week    Attends religious service: More than 4 times per year    Active member of club or organization: No    Attends meetings of  clubs or organizations: Not on file    Relationship status: Married   Intimate partner violence    Fear of current or ex partner: No    Emotionally abused: No    Physically abused: No    Forced sexual activity: No  Other Topics Concern   Not on file  Social History Narrative   HSG   Culinary school in [place]   Married - 07/20/82 - 5 years divorced; remarried '96   1 son - 20-Jul-1983              Family History:    Family History  Problem Relation Age of Onset   Diabetes Mother    Hyperlipidemia Mother    Hypertension Mother    Hypertension Father    Heart disease Father    Cancer Father    Sudden death Father        OCT 19-Jul-2009   CAD Father    Heart failure Father    Mesothelioma Father    Heart disease Maternal Aunt    Prostate cancer Neg Hx    Colon cancer Neg Hx      ROS:  Please see the history of present illness.   All other ROS reviewed and negative.     Physical Exam/Data:   Vitals:   12/03/18 2153-07-19 12/04/18 0559 12/04/18 0820 12/04/18 1349  BP: (!) 139/97 120/87 132/87 101/76  Pulse: 99 89 79 78  Resp: (!) 22 14  17   Temp: 99.1 F (37.3 C) 99 F (37.2 C)  98.3 F (36.8 C)  TempSrc: Oral Oral  Oral  SpO2:  100%  100%  Weight:      Height:        Intake/Output Summary (Last 24 hours) at 12/04/2018  [place] filed at 12/04/2018 1404 Gross per 24 hour  Intake 0 ml  Output --  Net 0 ml   Last 3 Weights 12/02/2018 12/02/2018 11/20/2018  Weight (lbs) 214 lb 15.2 oz 220 lb 231 lb 6 oz  Weight (kg) 97.5 kg 99.791 kg 104.951 kg     Body mass index is 30.84 kg/m.  General:  Well nourished, well developed, in no acute distress HEENT: normal Lymph: no adenopathy Neck: no JVD Endocrine:  No thryomegaly Vascular: No carotid bruits; FA pulses 2+ bilaterally without bruits  Cardiac:  normal S1, S2; RRR; no murmur  Lungs:  clear to auscultation bilaterally, no wheezing, rhonchi or rales  Abd: soft, nontender, no hepatomegaly  Ext: no  edema Musculoskeletal:  No deformities, BUE and BLE strength normal and equal Skin: warm and dry  Neuro:  CNs 2-12 intact, no focal abnormalities noted Psych:  Normal affect   EKG:  The EKG was personally reviewed and demonstrates:  NSR without significant ST-T wave changes Telemetry:  Telemetry was personally reviewed and demonstrates:  NSR without significant ventricular ectopy  Relevant CV Studies:  Echo 06/10/2018 IMPRESSIONS    1. The left ventricle has normal systolic function with an ejection fraction of 60-65%. The cavity size was normal. There is mildly increased left ventricular wall thickness. Left ventricular diastolic Doppler parameters are consistent with impaired  relaxation Indeterminent filling pressures The E/e' is 8-15.  2. The right ventricle has normal systolic function. The cavity was normal. There is no increase in right ventricular wall thickness.  3. The mitral valve is normal in structure.  4. The tricuspid valve is normal in structure.  5. The aortic valve is normal in structure.  6. The aortic root and ascending aorta are normal in size and structure.  7. The interatrial septum was not well visualized.  8. When compared to the prior study: Compared to a prior study in 01/2017, there are no new changes.   Cath 07/24/2018   Stable coronary anatomy when compared to post PCI images performed in February 2020.  Short, widely patent left main.  Large LAD that wraps around the left ventricular apex.  Irregularities up to 30% proximal.  Widely patent circumflex with mid vessel 20 to 30% eccentric narrowing, and widely patent stent and the dominant obtuse marginal.  Small branch of the distal circumflex contains 70% narrowing and is stable over time.  Widely patent right coronary including wide patency of the mid to distal stent placed in February 2020.  Low normal LV systolic function with EF 50%.  Normal filling pressures.  RECOMMENDATIONS:   IV  nitroglycerin was discontinued.  Consider alternative explanations for the patient's chest pain.   Cardiac MRI 11/15/2018 IMPRESSION: 1. Mildly dilated let ventricle with normal wall thickness and systolic function (LVEF =34%). There is mild hypokinesis in the mid inferolateral wall.  There is 25-50% endocardial late gadolinium enhancement in the basal and mid inferolateral walls (good prognosis of recovery if revascularized).  2. Normal right ventricular size, thickness and systolic function (LVEF = 52%). There are no regional wall motion abnormalities.  3.  Mildly dilated left atrium, normal size of the right atrium.  4. Normal size of the aortic root, ascending aorta. Mildly dilated pulmonary artery measuring 34 mm.  5.  Mild mitral and trivial tricuspid regurgitation.  6. Normal pericardium. No pericardial effusion. No evidence for pericarditis.  Laboratory Data:  High Sensitivity Troponin:   Recent Labs  Lab 12/02/18 1135 12/02/18 1343  TROPONINIHS 48* 44*     Cardiac EnzymesNo results for input(s): TROPONINI in the last 168 hours. No results for input(s): TROPIPOC in the last 168 hours.  Chemistry Recent Labs  Lab 12/03/18 0244 12/03/18 0704 12/04/18 0210  NA 133* 136 140  K 3.8 3.9 3.6  CL 97* 99 104  CO2 14* 17* 19*  GLUCOSE 162* 141* 121*  BUN 24* 24* 23*  CREATININE 1.39* 1.33* 1.22  CALCIUM 9.6 9.6 9.4  GFRNONAA 57* 60* >60  GFRAA >60 >60 >60  ANIONGAP 22* 20* 17*    Recent Labs  Lab 12/02/18 1135 12/03/18 0244 12/04/18 0210  PROT 8.8* 7.9 7.0  ALBUMIN 4.3 3.8 3.3*  AST 163* 153* 137*  ALT 74* 68* 57*  ALKPHOS 64 63 60  BILITOT 5.1* 3.3* 2.7*   Hematology Recent Labs  Lab 12/02/18 1135 12/02/18 1508 12/03/18 0244 12/04/18 0210  WBC 6.4  --  5.8 7.2  RBC 3.94*  --  3.72* 3.39*  HGB 13.1 12.9* 12.7* 11.7*  HCT 38.4* 38.0* 36.2* 32.9*  MCV 97.5  --  97.3 97.1  MCH 33.2  --  34.1* 34.5*  MCHC 34.1  --  35.1 35.6  RDW 15.3   --  15.5 15.6*  PLT 43*  --  47* 62*   BNP Recent Labs  Lab 12/02/18 1135  BNP 47.0    DDimer No results for input(s): DDIMER in the last 168 hours.   Radiology/Studies:  Dg Chest 2 View  Result Date: 12/02/2018 CLINICAL DATA:  Shortness of breath EXAM: CHEST - 2 VIEW COMPARISON:  CTA chest dated 10/29/2018. Partial comparison to CT abdomen/pelvis dated 12/02/2018 at 0111 hours FINDINGS: Mild left lower lobe opacity, new from recent CT abdomen/pelvis, favoring atelectasis. Right lung is clear, noting eventration right hemidiaphragm. No pleural effusion or pneumothorax. Cardiomegaly. Visualized osseous structures are within normal limits. IMPRESSION: Mild left lower lobe opacity, new from recent CT abdomen/pelvis earlier today, favoring atelectasis. Electronically Signed   By: Julian Hy M.D.   On: 12/02/2018 23:15   Ct Abdomen Pelvis W Contrast  Result Date: 12/02/2018 CLINICAL DATA:  Pain. Acute. Generalized. Personal history tubular adenoma the colon. EXAM: CT ABDOMEN AND PELVIS WITH CONTRAST TECHNIQUE: Multidetector CT imaging of the abdomen and pelvis was performed using the standard protocol following bolus administration of intravenous contrast. CONTRAST:  133mL OMNIPAQUE IOHEXOL 300 MG/ML  SOLN COMPARISON:  CT of the abdomen and pelvis 10/29/2018 FINDINGS: Lower chest: The lung bases are clear without focal nodule, mass, or airspace disease. Heart size is normal. Coronary artery calcifications are again noted. No significant pleural or pericardial effusion is present. Hepatobiliary: There is diffuse fatty infiltration of the liver. Homogeneous hyperdense material is present in the gallbladder. Common bile duct is normal. No focal hepatic lesions are present. Pancreas: Unremarkable. No pancreatic ductal dilatation or surrounding inflammatory changes. Spleen: Normal in size without focal abnormality. Adrenals/Urinary Tract: Adrenal glands are normal bilaterally. Exophytic cyst of the  left kidney are again noted. There is no stone or mass lesion. Ureters are within normal limits. Posterior bladder stone the left is stable. Is fat in the bladder wall anteriorly, stable. No other discrete lesions are present. Stomach/Bowel: Stomach and duodenum are within normal limits. Small bowel is unremarkable. Terminal ileum is within normal limits. Appendectomy is noted. Ascending and proximal transverse colon are mostly collapsed. Transverse and descending colon are within normal limits. Sigmoid colon is unremarkable. Vascular/Lymphatic: Atherosclerotic calcifications are present in the aorta without aneurysm. No significant  adenopathy is present. Reproductive: Prostate is mildly enlarged. No discrete lesions are present. Other: No abdominal wall hernia or abnormality. No abdominopelvic ascites. Musculoskeletal: Mild degenerative changes are noted in the lower lumbar spine. Degenerative changes are noted at the SI joints. Hips are within normal limits. IMPRESSION: 1. No acute or focal lesion to explain the patient's abdominal pain. 2. Extensive hepatic steatosis. 3.  Aortic Atherosclerosis (ICD10-I70.0). 4. Coronary artery disease. 5. Degenerative changes in the lower lumbar spine. Electronically Signed   By: San Morelle M.D.   On: 12/02/2018 13:27   Mr 3d Recon At Scanner  Result Date: 12/03/2018 CLINICAL DATA:  Abnormal LFTs EXAM: MRI ABDOMEN WITHOUT AND WITH CONTRAST (INCLUDING MRCP) TECHNIQUE: Multiplanar multisequence MR imaging of the abdomen was performed both before and after the administration of intravenous contrast. Heavily T2-weighted images of the biliary and pancreatic ducts were obtained, and three-dimensional MRCP images were rendered by post processing. CONTRAST:  10 mL Gadovist IV COMPARISON:  CT abdomen/pelvis dated 12/02/2018. MRI abdomen dated 10/30/2018. FINDINGS: Motion degraded images. Lower chest: Lung bases are clear. Hepatobiliary: Severe hepatic steatosis. No focal  hepatic lesions, noting that the dynamic postcontrast imaging is severely motion degraded. Gallbladder is unremarkable. No intrahepatic or extrahepatic ductal dilatation. Pancreas:  Within normal limits. Spleen:  Within normal limits. Adrenals/Urinary Tract:  Adrenal glands are within normal limits. Two left renal cysts measuring up to 2.8 cm, simple. Right kidney is within normal limits. No hydronephrosis. Stomach/Bowel: Stomach is within normal limits. Visualized bowel is unremarkable. Vascular/Lymphatic:  No evidence of abdominal aortic aneurysm. No suspicious abdominal lymphadenopathy. Other:  No abdominal ascites. Musculoskeletal: No focal osseous lesions. IMPRESSION: Motion degraded images. Severe hepatic steatosis. Otherwise unremarkable MRI abdomen. Electronically Signed   By: Julian Hy M.D.   On: 12/03/2018 20:54   Mr Abdomen Mrcp Moise Boring Contast  Result Date: 12/03/2018 CLINICAL DATA:  Abnormal LFTs EXAM: MRI ABDOMEN WITHOUT AND WITH CONTRAST (INCLUDING MRCP) TECHNIQUE: Multiplanar multisequence MR imaging of the abdomen was performed both before and after the administration of intravenous contrast. Heavily T2-weighted images of the biliary and pancreatic ducts were obtained, and three-dimensional MRCP images were rendered by post processing. CONTRAST:  10 mL Gadovist IV COMPARISON:  CT abdomen/pelvis dated 12/02/2018. MRI abdomen dated 10/30/2018. FINDINGS: Motion degraded images. Lower chest: Lung bases are clear. Hepatobiliary: Severe hepatic steatosis. No focal hepatic lesions, noting that the dynamic postcontrast imaging is severely motion degraded. Gallbladder is unremarkable. No intrahepatic or extrahepatic ductal dilatation. Pancreas:  Within normal limits. Spleen:  Within normal limits. Adrenals/Urinary Tract:  Adrenal glands are within normal limits. Two left renal cysts measuring up to 2.8 cm, simple. Right kidney is within normal limits. No hydronephrosis. Stomach/Bowel: Stomach is  within normal limits. Visualized bowel is unremarkable. Vascular/Lymphatic:  No evidence of abdominal aortic aneurysm. No suspicious abdominal lymphadenopathy. Other:  No abdominal ascites. Musculoskeletal: No focal osseous lesions. IMPRESSION: Motion degraded images. Severe hepatic steatosis. Otherwise unremarkable MRI abdomen. Electronically Signed   By: Julian Hy M.D.   On: 12/03/2018 20:54   US Abdomen Limited Ruq  Result Date: 12/02/2018 CLINICAL DATA:  58 year old male with elevated bilirubin. EXAM: ULTRASOUND ABDOMEN LIMITED RIGHT UPPER QUADRANT COMPARISON:  CT of the abdomen pelvis dated 12/02/2018 FINDINGS: Gallbladder: There is sludge in the gallbladder. No gallbladder wall thickening or pericholecystic fluid. Negative sonographic Murphy's sign. Common bile duct: Diameter: 8 mm Liver: The diffuse increased liver echogenicity most consistent with fatty infiltration. Superimposed fibrosis or inflammation is not excluded. Portal vein is  patent on color Doppler imaging with normal direction of blood flow towards the liver. Other: None. IMPRESSION: 1. Gallbladder sludge. No sonographic evidence of acute cholecystitis. 2. Fatty liver. Electronically Signed   By: Anner Crete M.D.   On: 12/02/2018 23:31    Assessment and Plan:   1. Preoperative clearance:   - DES to RCA placed in February 2020 with 70% OM 2 residual that was managed medically.  Repeat cardiac catheterization on 07/24/2018 showed no acute changes.   - Although he does continue to have shortness of breath with exertion in the past month, there has been no recurrent chest pain since about a month ago.  Overall, I think patient is stable to proceed with gallbladder surgery.    - Current problem has to do with his antiplatelet therapy, since he just had a drug-eluting stent placed in February 2020, ideally he was instructed to continue dual antiplatelet therapy include aspirin and Plavix for at least 12 months.  Instead, patient  says he was told to start holding both aspirin and Plavix prior to the surgery, the last dose for both medication was 5 days ago on 11/29/2018.  I do not think cardiology was informed of this.  It is likely too late to add on IV antiplatelet therapy, fortunately, there has been no symptom or EKG changes to suggest acute in-stent restenosis.  - Will discuss with MD.  We likely will need to monitor the patient closely and restart the aspirin and Plavix as soon as possible after the surgery at the surgeon's discretion.  2. Hypertension: Controlled on amlodipine, metoprolol and Imdur.  Home losartan stopped  3. Hyperlipidemia: On Zetia and Repatha     For questions or updates, please contact [place] Please consult [email or web address] for contact info under     Hilbert Corrigan, [place]  12/04/2018 3:12 PM

## 2018-12-04 NOTE — Consult Note (Signed)
   [facility] Inpatient Consult   12/04/2018  [name] Apr 30, 1960 [id number]    Patientwas screenedforpotentialneeds for[facility] Care Management services under his [employer] plan with an extreme high risk score of 30% for unplanned readmission; has 4 hospitalizations and 1 ED in the past 6 months.   Per chart reviewand history & physical on 12/02/18 reveals as follows: [name] is a 58 y.o. male with medical history significant of asthma, TIA, thrombocytopenia, CAD, hypertension, hyperlipidemia, hypothyroidism, GERD, diverticulosis, CKD stage II, chronic combined systolic and diastolic congestive heart failure, BPH presenting to the hospital for evaluation of emesis (abdominal pain, nausea, and vomiting)  Interestingly he was admitted to the hospital multiple times in the past with similar symptoms including 1 about a month ago where he underwent extensive evaluation including MRCP that did not show any biliary obstruction, HIDA scan showed nonvisualized gallbladder.  Was seen by surgery and asked to follow-up as outpatient as he had improvement in symptoms.  Primary care provider isDr. Riki Sheer with [facility], listed as providing transition of care.   Per MD notes, patient's current disposition ishome pending clinical improvement, with surgery on board and potential plan for cholecystectomy tomorrow 8/13.  Will follow for progress and disposition/needs. , Please place a [place] Management referralfor any needs for community follow-up as appropriate.  Of note, [place] Care Management services does not replace or interfere with any services that are arranged bytransition of carecase management or social work.   For questions and referral, please contact:  Dell Briner A. Adonia Porada, BSN, RN-BC [facility] Liaison Cell: [phone]

## 2018-12-05 ENCOUNTER — Inpatient Hospital Stay (HOSPITAL_COMMUNITY): Payer: 59

## 2018-12-05 LAB — CBC
HCT: 29.6 % — ABNORMAL LOW (ref 39.0–52.0)
Hemoglobin: 10.5 g/dL — ABNORMAL LOW (ref 13.0–17.0)
MCH: 34.2 pg — ABNORMAL HIGH (ref 26.0–34.0)
MCHC: 35.5 g/dL (ref 30.0–36.0)
MCV: 96.4 fL (ref 80.0–100.0)
Platelets: 88 10*3/uL — ABNORMAL LOW (ref 150–400)
RBC: 3.07 MIL/uL — ABNORMAL LOW (ref 4.22–5.81)
RDW: 15.5 % (ref 11.5–15.5)
WBC: 8.2 10*3/uL (ref 4.0–10.5)
nRBC: 0 % (ref 0.0–0.2)

## 2018-12-05 LAB — COMPREHENSIVE METABOLIC PANEL
ALT: 49 U/L — ABNORMAL HIGH (ref 0–44)
AST: 113 U/L — ABNORMAL HIGH (ref 15–41)
Albumin: 3.1 g/dL — ABNORMAL LOW (ref 3.5–5.0)
Alkaline Phosphatase: 68 U/L (ref 38–126)
Anion gap: 14 (ref 5–15)
BUN: 13 mg/dL (ref 6–20)
CO2: 22 mmol/L (ref 22–32)
Calcium: 9 mg/dL (ref 8.9–10.3)
Chloride: 102 mmol/L (ref 98–111)
Creatinine, Ser: 0.88 mg/dL (ref 0.61–1.24)
GFR calc Af Amer: >60 mL/min (ref >60)
GFR calc non Af Amer: >60 mL/min (ref >60)
Glucose, Bld: 119 mg/dL — ABNORMAL HIGH (ref 70–99)
Potassium: 3.1 mmol/L — ABNORMAL LOW (ref 3.5–5.1)
Sodium: 138 mmol/L (ref 135–145)
Total Bilirubin: 2.3 mg/dL — ABNORMAL HIGH (ref 0.3–1.2)
Total Protein: 6.7 g/dL (ref 6.5–8.1)

## 2018-12-05 LAB — LIPASE, BLOOD: Lipase: 278 U/L — ABNORMAL HIGH (ref 11–51)

## 2018-12-05 MED ORDER — POTASSIUM CHLORIDE 10 MEQ/100ML IV SOLN
10.0000 meq | INTRAVENOUS | Status: AC
  Administered 2018-12-05 (×5): 10 meq via INTRAVENOUS
  Filled 2018-12-05 (×6): qty 100

## 2018-12-05 MED ORDER — TECHNETIUM TC 99M MEBROFENIN IV KIT
5.2000 | PACK | Freq: Once | INTRAVENOUS | Status: AC | PRN
  Administered 2018-12-05: 5.2 via INTRAVENOUS

## 2018-12-05 NOTE — Progress Notes (Signed)
PROGRESS NOTE    [name]  KKX:[id number] DOB: [birth year] DOA: 12/02/2018 PCP: [name], DO    Brief Narrative:  Patient admitted with very interesting episodic abdominal pain that is associated with elevated LFTs, thrombocytopenia.  58 year old gentleman with history of asthma, TIA, thrombocytopenia, coronary artery disease, hypertension, hyperlipidemia, hypothyroidism, GERD and stage II chronic kidney disease and combined heart failure presented to the emergency room with 2 days of vomiting.  Patient had intermittent periumbilical abdominal pain and epigastric pain, vomiting and unable to eat anything since last 2 days.  Interestingly he was admitted to the hospital multiple times in the past with similar symptoms including 1 about a month ago where he underwent extensive evaluation including MRCP that did not show any biliary obstruction, HIDA scan showed nonvisualized gallbladder.  Was seen by surgery and asked to follow-up outpatient as he had improvement in symptoms.  Patient is poor historian, however he states that he was feeling well until 2 days ago.  He has not taken Plavix since Friday as he continued to have vomiting.  Occasional alcohol.  Denies drugs and UDS was negative. In the emergency room, tachypneic and tachycardic, on room air.  Elevated lipase and mild elevation of transaminases.  Metabolic acidosis with anion gap of 31 and mildly elevated creatinine from baseline.  Platelets 43.  CT scan of the abdomen pelvis as well as right upper quadrant ultrasound was normal.  MRCP of abdomen during this hospitalization was also unremarkable.  General surgery consulted IR on 12/05/2018 for percutaneous cholecystostomy tube.  Assessment & Plan:   Principal Problem:   Intractable vomiting with nausea Active Problems:   Essential hypertension   GERD   Thrombocytopenia (HCC)   Hypercholesteremia   Elevated LFTs   Elevated lipase   Metabolic acidosis  Intractable  nausea and vomiting, elevated lipase with presumed acute biliary pancreatitis and elevated LFTs: Chronic and recurrent with severe symptoms on presentation. Symptomatic gallbladder disease?  Passed biliary stone is also possibility. No evidence of acute cholecystitis.  MRCP negative for any CBD stone, cholelithiasis or cholecystitis.  Continue IV fluids with symptomatic treatment.  Surgery on board and potential plan for cholecystostomy tube placement by IR today.  Abnormal LFTs and thrombocytopenia: Extensively investigated in earlier admission.  Hepatitis panel negative.  Does have fatty infiltration of the liver.  Fairly stable and labs improving.  High anion gap metabolic acidosis: Presentation with bicarb of 8, anion gap 31.  Treated with isotonic fluid with clinical improvement.  No osmolar gap.  No evidence of significantly elevated acetone level in the body.  Negative for ethanol, isopropanol and methanol.  Probably related to starvation ketoacidosis.  Treated with aggressive IV fluids and already improving.  GERD: On PPI continue.  CKD stage III: At baseline.  Continue to watch.  Thrombocytopenia: Acute on chronic.  Interestingly improves with improvement of liver functions in the past.  Will need close monitoring.  Currently 4.  No signs of bleeding.  Coronary artery disease: Cardiac cath on 05/2018.  No new stent placed.  Continue nitrates, beta-blockers, antilipid medications.  Patient's aspirin and Plavix has been on hold for last 6 days.  Patient was seen by cardiology and they recommend resuming these 2 medications as soon as possible per general surgery's discretion.  Will defer that to general surgery.  History of depression: On Prozac, resume.  DVT prophylaxis: SCDs Code Status: Full code Family Communication: None Disposition Plan: Medical telemetry.  Home pending clinical improvement.   Consultants:  General surgery  Procedures:   None  Antimicrobials:    None   Subjective: Patient seen and examined.  Once again he continues to complain of abdominal pain 4 out of 5.  At periumbilical area.  No other complaint.  Objective: Vitals:   12/04/18 0820 12/04/18 1349 12/04/18 2200 12/05/18 0619  BP: 132/87 101/76 119/89 127/83  Pulse: 79 78 70 73  Resp:  17 18 18   Temp:  98.3 F (36.8 C) 98.6 F (37 C) 98.4 F (36.9 C)  TempSrc:  Oral Oral Oral  SpO2:  100% 99% 99%  Weight:      Height:        Intake/Output Summary (Last 24 hours) at 12/05/2018 1151 Last data filed at 12/04/2018 1404 Gross per 24 hour  Intake 0 ml  Output --  Net 0 ml   Filed Weights   12/02/18 1115 12/02/18 2103  Weight: 99.8 kg 97.5 kg    Examination:  General exam: Appears calm and comfortable, morbidly obese Respiratory system: Clear to auscultation. Respiratory effort normal. Cardiovascular system: S1 & S2 heard, RRR. No JVD, murmurs, rubs, gallops or clicks. No pedal edema. Gastrointestinal system: Abdomen is nondistended, soft and periumbilical tenderness. No organomegaly or masses felt. Normal bowel sounds heard. Central nervous system: Alert and oriented. No focal neurological deficits. Extremities: Symmetric 5 x 5 power. Skin: No rashes, lesions or ulcers Psychiatry: Judgement and insight appear poor mood & affect flat   Data Reviewed: I have personally reviewed following labs and imaging studies  CBC: Recent Labs  Lab 12/02/18 1135 12/02/18 1508 12/03/18 0244 12/04/18 0210 12/05/18 0519  WBC 6.4  --  5.8 7.2 8.2  NEUTROABS 4.6  --   --   --   --   HGB 13.1 12.9* 12.7* 11.7* 10.5*  HCT 38.4* 38.0* 36.2* 32.9* 29.6*  MCV 97.5  --  97.3 97.1 96.4  PLT 43*  --  47* 62* 88*   Basic Metabolic Panel: Recent Labs  Lab 12/02/18 2249 12/03/18 0244 12/03/18 0704 12/04/18 0210 12/05/18 0519  NA 135 133* 136 140 138  K 3.8 3.8 3.9 3.6 3.1*  CL 97* 97* 99 104 102  CO2 15* 14* 17* 19* 22  GLUCOSE 143* 162* 141* 121* 119*  BUN 22* 24*  24* 23* 13  CREATININE 1.41* 1.39* 1.33* 1.22 0.88  CALCIUM 9.6 9.6 9.6 9.4 9.0  MG 1.6*  --   --  1.9  --    GFR: Estimated Creatinine Clearance: 111.1 mL/min (by C-G formula based on SCr of 0.88 mg/dL). Liver Function Tests: Recent Labs  Lab 12/02/18 1135 12/03/18 0244 12/04/18 0210 12/05/18 0519  AST 163* 153* 137* 113*  ALT 74* 68* 57* 49*  ALKPHOS 64 63 60 68  BILITOT 5.1* 3.3* 2.7* 2.3*  PROT 8.8* 7.9 7.0 6.7  ALBUMIN 4.3 3.8 3.3* 3.1*   Recent Labs  Lab 12/02/18 1135 12/04/18 0210 12/05/18 0519  LIPASE 346* 173* 278*   No results for input(s): AMMONIA in the last 168 hours. Coagulation Profile: Recent Labs  Lab 12/03/18 0244  INR 1.2   Cardiac Enzymes: No results for input(s): CKTOTAL, CKMB, CKMBINDEX, TROPONINI in the last 168 hours. BNP (last 3 results) No results for input(s): PROBNP in the last 8760 hours. HbA1C: No results for input(s): HGBA1C in the last 72 hours. CBG: Recent Labs  Lab 12/02/18 1127  GLUCAP 135*   Lipid Profile: No results for input(s): CHOL, HDL, LDLCALC, TRIG, CHOLHDL, LDLDIRECT in the last 72  hours. Thyroid Function Tests: No results for input(s): TSH, T4TOTAL, FREET4, T3FREE, THYROIDAB in the last 72 hours. Anemia Panel: No results for input(s): VITAMINB12, FOLATE, FERRITIN, TIBC, IRON, RETICCTPCT in the last 72 hours. Sepsis Labs: Recent Labs  Lab 12/02/18 2249  LATICACIDVEN 1.8    Recent Results (from the past 240 hour(s))  SARS Coronavirus 2 [facility] order, Performed in [facility] hospital lab) Nasopharyngeal Nasopharyngeal Swab     Status: None   Collection Time: 12/02/18  3:14 PM   Specimen: Nasopharyngeal Swab  Result Value Ref Range Status   SARS Coronavirus 2 NEGATIVE NEGATIVE Final    Comment: (NOTE) If result is NEGATIVE SARS-CoV-2 target nucleic acids are NOT DETECTED. The SARS-CoV-2 RNA is generally detectable in upper and lower  respiratory specimens during the acute phase of infection. The lowest   concentration of SARS-CoV-2 viral copies this assay can detect is 250  copies / mL. A negative result does not preclude SARS-CoV-2 infection  and should not be used as the sole basis for treatment or other  patient management decisions.  A negative result may occur with  improper specimen collection / handling, submission of specimen other  than nasopharyngeal swab, presence of viral mutation(s) within the  areas targeted by this assay, and inadequate number of viral copies  (<250 copies / mL). A negative result must be combined with clinical  observations, patient history, and epidemiological information. If result is POSITIVE SARS-CoV-2 target nucleic acids are DETECTED. The SARS-CoV-2 RNA is generally detectable in upper and lower  respiratory specimens dur ing the acute phase of infection.  Positive  results are indicative of active infection with SARS-CoV-2.  Clinical  correlation with patient history and other diagnostic information is  necessary to determine patient infection status.  Positive results do  not rule out bacterial infection or co-infection with other viruses. If result is PRESUMPTIVE POSTIVE SARS-CoV-2 nucleic acids MAY BE PRESENT.   A presumptive positive result was obtained on the submitted specimen  and confirmed on repeat testing.  While 2019 novel coronavirus  (SARS-CoV-2) nucleic acids may be present in the submitted sample  additional confirmatory testing may be necessary for epidemiological  and / or clinical management purposes  to differentiate between  SARS-CoV-2 and other Sarbecovirus currently known to infect humans.  If clinically indicated additional testing with an alternate test  methodology [phone]) is advised. The SARS-CoV-2 RNA is generally  detectable in upper and lower respiratory sp ecimens during the acute  phase of infection. The expected result is Negative. Fact Sheet for Patients:  StrictlyIdeas.no Fact Sheet  for Healthcare Providers: BankingDealers.co.za This test is not yet approved or cleared by the [place] FDA and has been authorized for detection and/or diagnosis of SARS-CoV-2 by FDA under an Emergency Use Authorization (EUA).  This EUA will remain in effect (meaning this test can be used) for the duration of the COVID-19 declaration under Section 564(b)(1) of the Act, 21 U.S.C. section 360bbb-3(b)(1), unless the authorization is terminated or revoked sooner. Performed at [facility], [address], [place], [place] [address]          Radiology Studies: Mr 3d Recon At Scanner  Result Date: 12/03/2018 CLINICAL DATA:  Abnormal LFTs EXAM: MRI ABDOMEN WITHOUT AND WITH CONTRAST (INCLUDING MRCP) TECHNIQUE: Multiplanar multisequence MR imaging of the abdomen was performed both before and after the administration of intravenous contrast. Heavily T2-weighted images of the biliary and pancreatic ducts were obtained, and three-dimensional MRCP images were rendered  by post processing. CONTRAST:  10 mL Gadovist IV COMPARISON:  CT abdomen/pelvis dated 12/02/2018. MRI abdomen dated 10/30/2018. FINDINGS: Motion degraded images. Lower chest: Lung bases are clear. Hepatobiliary: Severe hepatic steatosis. No focal hepatic lesions, noting that the dynamic postcontrast imaging is severely motion degraded. Gallbladder is unremarkable. No intrahepatic or extrahepatic ductal dilatation. Pancreas:  Within normal limits. Spleen:  Within normal limits. Adrenals/Urinary Tract:  Adrenal glands are within normal limits. Two left renal cysts measuring up to 2.8 cm, simple. Right kidney is within normal limits. No hydronephrosis. Stomach/Bowel: Stomach is within normal limits. Visualized bowel is unremarkable. Vascular/Lymphatic:  No evidence of abdominal aortic aneurysm. No suspicious abdominal lymphadenopathy. Other:  No abdominal ascites. Musculoskeletal: No focal osseous lesions.  IMPRESSION: Motion degraded images. Severe hepatic steatosis. Otherwise unremarkable MRI abdomen. Electronically Signed   By: Julian Hy M.D.   On: 12/03/2018 20:54   Mr Abdomen Mrcp Moise Boring Contast  Result Date: 12/03/2018 CLINICAL DATA:  Abnormal LFTs EXAM: MRI ABDOMEN WITHOUT AND WITH CONTRAST (INCLUDING MRCP) TECHNIQUE: Multiplanar multisequence MR imaging of the abdomen was performed both before and after the administration of intravenous contrast. Heavily T2-weighted images of the biliary and pancreatic ducts were obtained, and three-dimensional MRCP images were rendered by post processing. CONTRAST:  10 mL Gadovist IV COMPARISON:  CT abdomen/pelvis dated 12/02/2018. MRI abdomen dated 10/30/2018. FINDINGS: Motion degraded images. Lower chest: Lung bases are clear. Hepatobiliary: Severe hepatic steatosis. No focal hepatic lesions, noting that the dynamic postcontrast imaging is severely motion degraded. Gallbladder is unremarkable. No intrahepatic or extrahepatic ductal dilatation. Pancreas:  Within normal limits. Spleen:  Within normal limits. Adrenals/Urinary Tract:  Adrenal glands are within normal limits. Two left renal cysts measuring up to 2.8 cm, simple. Right kidney is within normal limits. No hydronephrosis. Stomach/Bowel: Stomach is within normal limits. Visualized bowel is unremarkable. Vascular/Lymphatic:  No evidence of abdominal aortic aneurysm. No suspicious abdominal lymphadenopathy. Other:  No abdominal ascites. Musculoskeletal: No focal osseous lesions. IMPRESSION: Motion degraded images. Severe hepatic steatosis. Otherwise unremarkable MRI abdomen. Electronically Signed   By: Julian Hy M.D.   On: 12/03/2018 20:54        Scheduled Meds:  amLODipine  10 mg Oral Daily   ezetimibe  10 mg Oral Daily   feeding supplement (ENSURE ENLIVE)  237 mL Oral BID BM   FLUoxetine  20 mg Oral Daily   isosorbide mononitrate  30 mg Oral Daily   loratadine  10 mg Oral QHS    metoprolol succinate  50 mg Oral Daily   Continuous Infusions:  sodium chloride 150 mL/hr at 12/05/18 1137   famotidine (PEPCID) IV 20 mg (12/04/18 2138)   potassium chloride 10 mEq (12/05/18 1139)     LOS: 3 days    Time spent: 28 minutes.  Darliss Cheney, MD [facility] Pager [phone]  If 7PM-7AM, please contact night-coverage [email or web address] Password [facility] 12/05/2018, 11:51 AM

## 2018-12-05 NOTE — Progress Notes (Signed)
Request to IR for percutaneous cholecystostomy placement.   Patient chart and imaging reviewed today by Dr. Kathlene Cote who states that patient has fatty liver disease without evidence of cholecystitis by recent imaging. A repeat HIDA scan would be needed before consideration of percutaneous cholecystostomy as MR, [place] and CT over last 3 days show no evidence of cholecystitis or biliary obstruction and the patient is afebrile without leukocytosis.  This information has been relayed to [employer], PA-C today.  Please call with questions or concerns.  Candiss Norse, PA-C Pager# [phone]

## 2018-12-05 NOTE — Progress Notes (Signed)
[facility] Progress Note     Subjective: CC-  Continues to complain of epigastric abdominal pain. Some nausea, no emesis yesterday. Tolerating few clear liquids. BM yesterday. Lipase up today 278, Tbili down but still elevated 2.3  Objective: Vital signs in last 24 hours: Temp:  [98.3 F (36.8 C)-98.6 F (37 C)] 98.4 F (36.9 C) (08/13 0619) Pulse Rate:  [70-78] 73 (08/13 0619) Resp:  [17-18] 18 (08/13 0619) BP: (101-127)/(76-89) 127/83 (08/13 0619) SpO2:  [99 %-100 %] 99 % (08/13 0619) Last BM Date: 12/04/18  Intake/Output from previous day: No intake/output data recorded. Intake/Output this shift: No intake/output data recorded.  PE: Gen:  Alert, NAD, pleasant HEENT: EOM's intact, pupils equal and round Pulm:  Rate and effort normal Abd: well healed RLQ and midlineincisions, soft,nondistended, mild epigastric and LUQ TTP without rebound or guarding, no RUQ TTP, +BS, no masses or organomegaly. Soft/reducible umbilical hernia Ext:  No BLE edema Skin: no rashes noted, warm and dry  Lab Results:  Recent Labs    12/04/18 0210 12/05/18 0519  WBC 7.2 8.2  HGB 11.7* 10.5*  HCT 32.9* 29.6*  PLT 62* 88*   BMET Recent Labs    12/04/18 0210 12/05/18 0519  NA 140 138  K 3.6 3.1*  CL 104 102  CO2 19* 22  GLUCOSE 121* 119*  BUN 23* 13  CREATININE 1.22 0.88  CALCIUM 9.4 9.0   PT/INR Recent Labs    12/03/18 0244  LABPROT 15.1  INR 1.2   CMP     Component Value Date/Time   NA 138 12/05/2018 0519   NA 141 06/07/2018 1443   K 3.1 (L) 12/05/2018 0519   CL 102 12/05/2018 0519   CO2 22 12/05/2018 0519   GLUCOSE 119 (H) 12/05/2018 0519   BUN 13 12/05/2018 0519   BUN 23 06/07/2018 1443   CREATININE 0.88 12/05/2018 0519   CALCIUM 9.0 12/05/2018 0519   PROT 6.7 12/05/2018 0519   PROT 7.3 05/29/2017 0915   ALBUMIN 3.1 (L) 12/05/2018 0519   ALBUMIN 4.4 05/29/2017 0915   AST 113 (H) 12/05/2018 0519   ALT 49 (H) 12/05/2018 0519   ALKPHOS 68  12/05/2018 0519   BILITOT 2.3 (H) 12/05/2018 0519   BILITOT 0.3 05/29/2017 0915   GFRNONAA >60 12/05/2018 0519   GFRAA >60 12/05/2018 0519   Lipase     Component Value Date/Time   LIPASE 278 (H) 12/05/2018 0519       Studies/Results: Mr 3d Recon At Scanner  Result Date: 12/03/2018 CLINICAL DATA:  Abnormal LFTs EXAM: MRI ABDOMEN WITHOUT AND WITH CONTRAST (INCLUDING MRCP) TECHNIQUE: Multiplanar multisequence MR imaging of the abdomen was performed both before and after the administration of intravenous contrast. Heavily T2-weighted images of the biliary and pancreatic ducts were obtained, and three-dimensional MRCP images were rendered by post processing. CONTRAST:  10 mL Gadovist IV COMPARISON:  CT abdomen/pelvis dated 12/02/2018. MRI abdomen dated 10/30/2018. FINDINGS: Motion degraded images. Lower chest: Lung bases are clear. Hepatobiliary: Severe hepatic steatosis. No focal hepatic lesions, noting that the dynamic postcontrast imaging is severely motion degraded. Gallbladder is unremarkable. No intrahepatic or extrahepatic ductal dilatation. Pancreas:  Within normal limits. Spleen:  Within normal limits. Adrenals/Urinary Tract:  Adrenal glands are within normal limits. Two left renal cysts measuring up to 2.8 cm, simple. Right kidney is within normal limits. No hydronephrosis. Stomach/Bowel: Stomach is within normal limits. Visualized bowel is unremarkable. Vascular/Lymphatic:  No evidence of abdominal aortic aneurysm. No suspicious abdominal lymphadenopathy. Other:  No abdominal ascites. Musculoskeletal: No focal osseous lesions. IMPRESSION: Motion degraded images. Severe hepatic steatosis. Otherwise unremarkable MRI abdomen. Electronically Signed   By: Julian Hy M.D.   On: 12/03/2018 20:54   Mr Abdomen Mrcp Moise Boring Contast  Result Date: 12/03/2018 CLINICAL DATA:  Abnormal LFTs EXAM: MRI ABDOMEN WITHOUT AND WITH CONTRAST (INCLUDING MRCP) TECHNIQUE: Multiplanar multisequence MR imaging  of the abdomen was performed both before and after the administration of intravenous contrast. Heavily T2-weighted images of the biliary and pancreatic ducts were obtained, and three-dimensional MRCP images were rendered by post processing. CONTRAST:  10 mL Gadovist IV COMPARISON:  CT abdomen/pelvis dated 12/02/2018. MRI abdomen dated 10/30/2018. FINDINGS: Motion degraded images. Lower chest: Lung bases are clear. Hepatobiliary: Severe hepatic steatosis. No focal hepatic lesions, noting that the dynamic postcontrast imaging is severely motion degraded. Gallbladder is unremarkable. No intrahepatic or extrahepatic ductal dilatation. Pancreas:  Within normal limits. Spleen:  Within normal limits. Adrenals/Urinary Tract:  Adrenal glands are within normal limits. Two left renal cysts measuring up to 2.8 cm, simple. Right kidney is within normal limits. No hydronephrosis. Stomach/Bowel: Stomach is within normal limits. Visualized bowel is unremarkable. Vascular/Lymphatic:  No evidence of abdominal aortic aneurysm. No suspicious abdominal lymphadenopathy. Other:  No abdominal ascites. Musculoskeletal: No focal osseous lesions. IMPRESSION: Motion degraded images. Severe hepatic steatosis. Otherwise unremarkable MRI abdomen. Electronically Signed   By: Julian Hy M.D.   On: 12/03/2018 20:54    Anti-infectives: Anti-infectives (From admission, onward)   None       Assessment/Plan CADs/p STEMI in 2018 with LCx PCI and recent PCI to mid RCA 05/2018 on plavix(last dose 8/7) Chronic combined systolic and diastolic congestive heart failure Asthma HTN AKI onCKDstage III HLD Hypothyroidism Umbilical hernia Thombocytopenia  Acute pancreatitis Cholecystitis - HIDA+ 7/9 Elevated LFTs - MRCP negative for choledocholithiasis - LFTs slowly trending down, Tbili 2.3 - lipase is up today 278 from 173, and patient is still tender epigastric/LUQ - Discussed risks/benefits of perc chole vs lap chole with  MD and patient. With his pancreatitis and MMP recommend proceeding with perc chole at this time and plan for lap chole several weeks down the road once medically more optimal for an operation. Once perc chole is placed patient could be restarted on his antiplatelet therapy quicker. Will consult IR.  ID -none. WBC 8.2, afebrile FEN -IVF @ 150cc/hr, NPO VTE -SCDs, per primary, no chemical DVT prophylaxis due to thrombocytopenia Foley -none Follow up -TBD    LOS: 3 days    Wellington Hampshire , [facility] 12/05/2018, 10:37 AM Pager: [phone] Mon-Thurs 7:00 am-4:30 pm Fri 7:00 am -11:30 AM Sat-Sun 7:00 am-11:30 am

## 2018-12-06 DIAGNOSIS — R112 Nausea with vomiting, unspecified: Secondary | ICD-10-CM

## 2018-12-06 LAB — CBC
HCT: 27.8 % — ABNORMAL LOW (ref 39.0–52.0)
Hemoglobin: 9.8 g/dL — ABNORMAL LOW (ref 13.0–17.0)
MCH: 34.4 pg — ABNORMAL HIGH (ref 26.0–34.0)
MCHC: 35.3 g/dL (ref 30.0–36.0)
MCV: 97.5 fL (ref 80.0–100.0)
Platelets: 108 10*3/uL — ABNORMAL LOW (ref 150–400)
RBC: 2.85 MIL/uL — ABNORMAL LOW (ref 4.22–5.81)
RDW: 15.4 % (ref 11.5–15.5)
WBC: 7.5 10*3/uL (ref 4.0–10.5)
nRBC: 0 % (ref 0.0–0.2)

## 2018-12-06 LAB — COMPREHENSIVE METABOLIC PANEL
ALT: 42 U/L (ref 0–44)
AST: 81 U/L — ABNORMAL HIGH (ref 15–41)
Albumin: 2.8 g/dL — ABNORMAL LOW (ref 3.5–5.0)
Alkaline Phosphatase: 65 U/L (ref 38–126)
Anion gap: 13 (ref 5–15)
BUN: 6 mg/dL (ref 6–20)
CO2: 24 mmol/L (ref 22–32)
Calcium: 8.7 mg/dL — ABNORMAL LOW (ref 8.9–10.3)
Chloride: 99 mmol/L (ref 98–111)
Creatinine, Ser: 0.78 mg/dL (ref 0.61–1.24)
GFR calc Af Amer: >60 mL/min (ref >60)
GFR calc non Af Amer: >60 mL/min (ref >60)
Glucose, Bld: 113 mg/dL — ABNORMAL HIGH (ref 70–99)
Potassium: 2.9 mmol/L — ABNORMAL LOW (ref 3.5–5.1)
Sodium: 136 mmol/L (ref 135–145)
Total Bilirubin: 1.6 mg/dL — ABNORMAL HIGH (ref 0.3–1.2)
Total Protein: 6.6 g/dL (ref 6.5–8.1)

## 2018-12-06 LAB — POTASSIUM
Potassium: 3.3 mmol/L — ABNORMAL LOW (ref 3.5–5.1)
Potassium: 3.4 mmol/L — ABNORMAL LOW (ref 3.5–5.1)

## 2018-12-06 LAB — LIPASE, BLOOD: Lipase: 215 U/L — ABNORMAL HIGH (ref 11–51)

## 2018-12-06 MED ORDER — ASPIRIN EC 81 MG PO TBEC
81.0000 mg | DELAYED_RELEASE_TABLET | Freq: Every day | ORAL | Status: DC
  Administered 2018-12-06 – 2018-12-11 (×5): 81 mg via ORAL
  Filled 2018-12-06 (×6): qty 1

## 2018-12-06 MED ORDER — POTASSIUM CHLORIDE 10 MEQ/50ML IV SOLN
10.0000 meq | INTRAVENOUS | Status: DC
  Filled 2018-12-06 (×5): qty 50

## 2018-12-06 MED ORDER — POTASSIUM CHLORIDE 10 MEQ/100ML IV SOLN
10.0000 meq | INTRAVENOUS | Status: AC
  Administered 2018-12-06 (×5): 10 meq via INTRAVENOUS
  Filled 2018-12-06 (×5): qty 100

## 2018-12-06 NOTE — Progress Notes (Signed)
Patient ID: [name], male   DOB: 06-16-1960, 58 y.o.   MRN: [id number]   HIDA scan Neg today per Dr Anselm Pancoast  No indication for cholecystostomy drain placement  Will cancel perc chole order  CCS aware

## 2018-12-06 NOTE — Progress Notes (Signed)
[facility]/Trauma Progress Note      Assessment/Plan CADs/p STEMI in 2018 with LCx PCI and recent PCI to mid RCA 05/2018 on plavix(last dose 8/7) Chronic combined systolic and diastolic congestive heart failure Asthma HTN AKI onCKDstage III HLD Hypothyroidism Umbilical hernia Thombocytopenia  Acute pancreatitis Cholecystitis - HIDA+ 7/9 Elevated LFTs -MRCP negative for choledocholithiasis - LFTs slowly trending down, Tbili down to 1.6 - lipase down to 215, but patient is still tender on exam - HIDA neg so IR did not place perc chole - recommend taking pt to OR for lap chole once pancreatitis resolves  ID -none. WBC 7.5, afebrile FEN -IVF, CLD VTE -SCDs, per primary, okay to restart ASA but continue to hold plavix Foley -none Follow up -TBD    LOS: 4 days    Subjective: CC: abdominal pain and nausea  Pt is having continued epigastric abdominal pain and nausea but no vomiting. Discussed waiting for pancreatitis to resolve before lap chole. Also discussed HIDA results. Pt expresses understanding. No family at bedside.   Objective: Vital signs in last 24 hours: Temp:  [98.5 F (36.9 C)-98.6 F (37 C)] 98.5 F (36.9 C) (08/14 0549) Pulse Rate:  [77-111] 79 (08/14 0920) Resp:  [16-19] 16 (08/14 0549) BP: (115-144)/(74-101) 141/92 (08/14 0919) SpO2:  [93 %-100 %] 93 % (08/14 0549) Last BM Date: 12/04/18  Intake/Output from previous day: 08/13 0701 - 08/14 0700 In: 7079 [I.V.:7079] Out: -  Intake/Output this shift: No intake/output data recorded.  PE: Gen: Alert, NAD, pleasant HEENT: pupils equal and round Pulm:Rate andeffort normal DVV:OHYWV, soft,nondistended, epigastric and RUQ TTP with some guarding, +BS, no masses or organomegaly.  Skin: no rashes noted, warm and dry  Anti-infectives: Anti-infectives (From admission, onward)   None      Lab Results:  Recent Labs    12/05/18 0519 12/06/18 0248  WBC 8.2 7.5  HGB  10.5* 9.8*  HCT 29.6* 27.8*  PLT 88* 108*   BMET Recent Labs    12/05/18 0519 12/06/18 0248  NA 138 136  K 3.1* 2.9*  CL 102 99  CO2 22 24  GLUCOSE 119* 113*  BUN 13 6  CREATININE 0.88 0.78  CALCIUM 9.0 8.7*   PT/INR No results for input(s): LABPROT, INR in the last 72 hours. CMP     Component Value Date/Time   NA 136 12/06/2018 0248   NA 141 06/07/2018 1443   K 2.9 (L) 12/06/2018 0248   CL 99 12/06/2018 0248   CO2 24 12/06/2018 0248   GLUCOSE 113 (H) 12/06/2018 0248   BUN 6 12/06/2018 0248   BUN 23 06/07/2018 1443   CREATININE 0.78 12/06/2018 0248   CALCIUM 8.7 (L) 12/06/2018 0248   PROT 6.6 12/06/2018 0248   PROT 7.3 05/29/2017 0915   ALBUMIN 2.8 (L) 12/06/2018 0248   ALBUMIN 4.4 05/29/2017 0915   AST 81 (H) 12/06/2018 0248   ALT 42 12/06/2018 0248   ALKPHOS 65 12/06/2018 0248   BILITOT 1.6 (H) 12/06/2018 0248   BILITOT 0.3 05/29/2017 0915   GFRNONAA >60 12/06/2018 0248   GFRAA >60 12/06/2018 0248   Lipase     Component Value Date/Time   LIPASE 215 (H) 12/06/2018 0248    Studies/Results: Nm Hepatobiliary Liver Func  Result Date: 12/05/2018 CLINICAL DATA:  Abdominal pain EXAM: NUCLEAR MEDICINE HEPATOBILIARY IMAGING TECHNIQUE: Sequential images of the abdomen were obtained out to 60 minutes following intravenous administration of radiopharmaceutical. RADIOPHARMACEUTICALS:  5.2 mCi Tc-75m  Choletec IV COMPARISON:  None.  FINDINGS: Prompt uptake and biliary excretion of activity by the liver is seen. Gallbladder activity is visualized, consistent with patency of cystic duct. Biliary activity passes into small bowel, consistent with patent common bile duct. IMPRESSION: Normal exam. Electronically Signed   By: Prudencio Pair M.D.   On: 12/05/2018 16:48      Kalman Drape , [facility] 12/06/2018, 9:27 AM  Pager: [phone] Mon-Wed, Friday 7:00am-4:30pm Thurs 7am-11:30am  Consults: [phone]

## 2018-12-06 NOTE — Progress Notes (Signed)
PROGRESS NOTE    [name]  BMW:[id number] DOB: [birth year] DOA: 12/02/2018 PCP: [name], DO    Brief Narrative:  Patient admitted with very interesting episodic abdominal pain that is associated with elevated LFTs, thrombocytopenia.  58 year old gentleman with history of asthma, TIA, thrombocytopenia, coronary artery disease, hypertension, hyperlipidemia, hypothyroidism, GERD and stage II chronic kidney disease and combined heart failure presented to the emergency room with 2 days of vomiting.  Patient had intermittent periumbilical abdominal pain and epigastric pain, vomiting and unable to eat anything since last 2 days.  Interestingly he was admitted to the hospital multiple times in the past with similar symptoms including 1 about a month ago where he underwent extensive evaluation including MRCP that did not show any biliary obstruction, HIDA scan showed nonvisualized gallbladder.  Was seen by surgery and asked to follow-up outpatient as he had improvement in symptoms.  Patient is poor historian, however he states that he was feeling well until 2 days ago.  He has not taken Plavix since Friday as he continued to have vomiting.  Occasional alcohol.  Denies drugs and UDS was negative. In the emergency room, tachypneic and tachycardic, on room air.  Elevated lipase and mild elevation of transaminases.  Metabolic acidosis with anion gap of 31 and mildly elevated creatinine from baseline.  Platelets 43.  CT scan of the abdomen pelvis as well as right upper quadrant ultrasound was normal.  MRCP of abdomen during this hospitalization was also unremarkable.  General surgery consulted IR on 12/05/2018 for percutaneous cholecystostomy tube however IR needed repeat HIDA scan in order to do that.  Repeat HIDA scan on 12/05/2018 was negative.  No indication for cholecystostomy tube per IR.  General surgery consulted cardiology for cardiac clearance and they recommended resuming his aspirin and  Plavix as soon as possible per surgery's discretion as he had DES placed in 6 months ago.  General surgery now plans to do laparoscopic cholecystectomy once pancreatitis resolved and recommends to hold aspirin and Plavix for now.  Assessment & Plan:   Principal Problem:   Intractable vomiting with nausea Active Problems:   Essential hypertension   GERD   Thrombocytopenia (HCC)   Hypercholesteremia   Elevated LFTs   Elevated lipase   Metabolic acidosis  Intractable nausea and vomiting, elevated lipase with presumed acute biliary pancreatitis and elevated LFTs: Chronic and recurrent with severe symptoms on presentation. Symptomatic gallbladder disease?  Passed biliary stone is also possibility. No evidence of acute cholecystitis.  MRCP negative for any CBD stone, cholelithiasis or cholecystitis.  Continue IV fluids with symptomatic treatment.  Surgery on board.  Consulted cardiology for clearance.  Cardiology recommends resuming aspirin and Plavix as soon as possible per surgery's discretion.  I had required repeat HIDA scan which was negative yesterday for any acute cholecystitis.  Plans for cholecystostomy tube were canceled.  General surgery now plans on doing laparoscopic cholecystectomy and they recommend continuing to hold aspirin and Plavix as patient may get this planned anytime.  Abnormal LFTs and thrombocytopenia: Extensively investigated in earlier admission.  Hepatitis panel negative.  Does have fatty infiltration of the liver.  Fairly stable and labs improving.  High anion gap metabolic acidosis: Presentation with bicarb of 8, anion gap 31.  Treated with isotonic fluid with clinical improvement.  No osmolar gap.  No evidence of significantly elevated acetone level in the body.  Negative for ethanol, isopropanol and methanol.  Probably related to starvation ketoacidosis.  Treated with aggressive IV fluids and already improving.  GERD: On PPI continue.  CKD stage III: At baseline.   Continue to watch.  Thrombocytopenia: Acute on chronic.  Interestingly improves with improvement of liver functions in the past.  Will need close monitoring.  Currently 108.  No signs of bleeding.  Coronary artery disease: Cardiac cath on 05/2018.  No new stent placed.  Continue nitrates, beta-blockers, antilipid medications.  Patient's aspirin and Plavix has been on hold for last 7 days.  Patient was seen by cardiology and they recommend resuming these 2 medications as soon as possible per general surgery's discretion.  Will defer that to general surgery.  Please see above about my discussion with them.  History of depression: On Prozac, resume.  DVT prophylaxis: SCDs Code Status: Full code Family Communication: None Disposition Plan:  Home pending clinical improvement.   Consultants:   General surgery Cardiology  Procedures:   None  Antimicrobials:   None   Subjective: Patient seen and examined.  Still has abdominal pain 5 out of 10, periumbilical.  No other complaint.  Objective: Vitals:   12/05/18 2200 12/06/18 0549 12/06/18 0919 12/06/18 0920  BP: 115/78 (!) 132/92 (!) 141/92   Pulse: 77 (!) 111  79  Resp: 16 16    Temp: 98.5 F (36.9 C) 98.5 F (36.9 C)    TempSrc: Oral Oral    SpO2: 99% 93%    Weight:      Height:        Intake/Output Summary (Last 24 hours) at 12/06/2018 1014 Last data filed at 12/06/2018 0917 Gross per 24 hour  Intake 7897.23 ml  Output --  Net 7897.23 ml   Filed Weights   12/02/18 1115 12/02/18 2103  Weight: 99.8 kg 97.5 kg    Examination:  General exam: Appears calm and comfortable, morbidly obese Respiratory system: Clear to auscultation. Respiratory effort normal. Cardiovascular system: S1 & S2 heard, RRR. No JVD, murmurs, rubs, gallops or clicks. No pedal edema. Gastrointestinal system: Abdomen is nondistended, soft and mild epigastric tenderness. No organomegaly or masses felt. Normal bowel sounds heard. Central nervous  system: Alert and oriented. No focal neurological deficits. Extremities: Symmetric 5 x 5 power. Skin: No rashes, lesions or ulcers Psychiatry: Judgement and insight appear poor. Mood & affect flat   Data Reviewed: I have personally reviewed following labs and imaging studies  CBC: Recent Labs  Lab 12/02/18 1135 12/02/18 1508 12/03/18 0244 12/04/18 0210 12/05/18 0519 12/06/18 0248  WBC 6.4  --  5.8 7.2 8.2 7.5  NEUTROABS 4.6  --   --   --   --   --   HGB 13.1 12.9* 12.7* 11.7* 10.5* 9.8*  HCT 38.4* 38.0* 36.2* 32.9* 29.6* 27.8*  MCV 97.5  --  97.3 97.1 96.4 97.5  PLT 43*  --  47* 62* 88* 789*   Basic Metabolic Panel: Recent Labs  Lab 12/02/18 2249 12/03/18 0244 12/03/18 0704 12/04/18 0210 12/05/18 0519 12/06/18 0248  NA 135 133* 136 140 138 136  K 3.8 3.8 3.9 3.6 3.1* 2.9*  CL 97* 97* 99 104 102 99  CO2 15* 14* 17* 19* 22 24  GLUCOSE 143* 162* 141* 121* 119* 113*  BUN 22* 24* 24* 23* 13 6  CREATININE 1.41* 1.39* 1.33* 1.22 0.88 0.78  CALCIUM 9.6 9.6 9.6 9.4 9.0 8.7*  MG 1.6*  --   --  1.9  --   --    GFR: Estimated Creatinine Clearance: 122.2 mL/min (by C-G formula based on SCr of 0.78 mg/dL). Liver Function  Tests: Recent Labs  Lab 12/02/18 1135 12/03/18 0244 12/04/18 0210 12/05/18 0519 12/06/18 0248  AST 163* 153* 137* 113* 81*  ALT 74* 68* 57* 49* 42  ALKPHOS 64 63 60 68 65  BILITOT 5.1* 3.3* 2.7* 2.3* 1.6*  PROT 8.8* 7.9 7.0 6.7 6.6  ALBUMIN 4.3 3.8 3.3* 3.1* 2.8*   Recent Labs  Lab 12/02/18 1135 12/04/18 0210 12/05/18 0519 12/06/18 0248  LIPASE 346* 173* 278* 215*   No results for input(s): AMMONIA in the last 168 hours. Coagulation Profile: Recent Labs  Lab 12/03/18 0244  INR 1.2   Cardiac Enzymes: No results for input(s): CKTOTAL, CKMB, CKMBINDEX, TROPONINI in the last 168 hours. BNP (last 3 results) No results for input(s): PROBNP in the last 8760 hours. HbA1C: No results for input(s): HGBA1C in the last 72 hours. CBG: Recent  Labs  Lab 12/02/18 1127  GLUCAP 135*   Lipid Profile: No results for input(s): CHOL, HDL, LDLCALC, TRIG, CHOLHDL, LDLDIRECT in the last 72 hours. Thyroid Function Tests: No results for input(s): TSH, T4TOTAL, FREET4, T3FREE, THYROIDAB in the last 72 hours. Anemia Panel: No results for input(s): VITAMINB12, FOLATE, FERRITIN, TIBC, IRON, RETICCTPCT in the last 72 hours. Sepsis Labs: Recent Labs  Lab 12/02/18 2249  LATICACIDVEN 1.8    Recent Results (from the past 240 hour(s))  SARS Coronavirus 2 [facility] order, Performed in [facility] hospital lab) Nasopharyngeal Nasopharyngeal Swab     Status: None   Collection Time: 12/02/18  3:14 PM   Specimen: Nasopharyngeal Swab  Result Value Ref Range Status   SARS Coronavirus 2 NEGATIVE NEGATIVE Final    Comment: (NOTE) If result is NEGATIVE SARS-CoV-2 target nucleic acids are NOT DETECTED. The SARS-CoV-2 RNA is generally detectable in upper and lower  respiratory specimens during the acute phase of infection. The lowest  concentration of SARS-CoV-2 viral copies this assay can detect is 250  copies / mL. A negative result does not preclude SARS-CoV-2 infection  and should not be used as the sole basis for treatment or other  patient management decisions.  A negative result may occur with  improper specimen collection / handling, submission of specimen other  than nasopharyngeal swab, presence of viral mutation(s) within the  areas targeted by this assay, and inadequate number of viral copies  (<250 copies / mL). A negative result must be combined with clinical  observations, patient history, and epidemiological information. If result is POSITIVE SARS-CoV-2 target nucleic acids are DETECTED. The SARS-CoV-2 RNA is generally detectable in upper and lower  respiratory specimens dur ing the acute phase of infection.  Positive  results are indicative of active infection with SARS-CoV-2.  Clinical  correlation with patient history and  other diagnostic information is  necessary to determine patient infection status.  Positive results do  not rule out bacterial infection or co-infection with other viruses. If result is PRESUMPTIVE POSTIVE SARS-CoV-2 nucleic acids MAY BE PRESENT.   A presumptive positive result was obtained on the submitted specimen  and confirmed on repeat testing.  While 2019 novel coronavirus  (SARS-CoV-2) nucleic acids may be present in the submitted sample  additional confirmatory testing may be necessary for epidemiological  and / or clinical management purposes  to differentiate between  SARS-CoV-2 and other Sarbecovirus currently known to infect humans.  If clinically indicated additional testing with an alternate test  methodology [phone]) is advised. The SARS-CoV-2 RNA is generally  detectable in upper and lower respiratory sp ecimens during the acute  phase of infection.  The expected result is Negative. Fact Sheet for Patients:  StrictlyIdeas.no Fact Sheet for Healthcare Providers: BankingDealers.co.za This test is not yet approved or cleared by the [place] FDA and has been authorized for detection and/or diagnosis of SARS-CoV-2 by FDA under an Emergency Use Authorization (EUA).  This EUA will remain in effect (meaning this test can be used) for the duration of the COVID-19 declaration under Section 564(b)(1) of the Act, 21 U.S.C. section 360bbb-3(b)(1), unless the authorization is terminated or revoked sooner. Performed at [facility], [address] [place], [place] [address]          Radiology Studies: Nm Hepatobiliary Liver Func  Result Date: 12/05/2018 CLINICAL DATA:  Abdominal pain EXAM: NUCLEAR MEDICINE HEPATOBILIARY IMAGING TECHNIQUE: Sequential images of the abdomen were obtained out to 60 minutes following intravenous administration of radiopharmaceutical. RADIOPHARMACEUTICALS:  5.2 mCi Tc-66m  Choletec IV  COMPARISON:  None. FINDINGS: Prompt uptake and biliary excretion of activity by the liver is seen. Gallbladder activity is visualized, consistent with patency of cystic duct. Biliary activity passes into small bowel, consistent with patent common bile duct. IMPRESSION: Normal exam. Electronically Signed   By: Prudencio Pair M.D.   On: 12/05/2018 16:48        Scheduled Meds:  amLODipine  10 mg Oral Daily   ezetimibe  10 mg Oral Daily   feeding supplement (ENSURE ENLIVE)  237 mL Oral BID BM   FLUoxetine  20 mg Oral Daily   isosorbide mononitrate  30 mg Oral Daily   loratadine  10 mg Oral QHS   metoprolol succinate  50 mg Oral Daily   Continuous Infusions:  sodium chloride 150 mL/hr at 12/06/18 0917   famotidine (PEPCID) IV Stopped (12/05/18 2228)   potassium chloride 10 mEq (12/06/18 0917)     LOS: 4 days    Time spent: 30 minutes.  Darliss Cheney, MD [facility] Pager [phone]  If 7PM-7AM, please contact night-coverage [email or web address] Password [email or web address] 12/06/2018, 10:14 AM

## 2018-12-06 NOTE — Progress Notes (Signed)
   Notes reviewed.  If no IR or surgical treatment performed, please be sure to restart dual antiplatelet therapy, ASA 81 and Plavix 75.  DES placed about 6 months ago.   Thanks  No further recommendations at this time.  Please see Dr Hilty/Meng consult note for prior details.   Signing off.   Candee Furbish, MD

## 2018-12-07 DIAGNOSIS — K859 Acute pancreatitis without necrosis or infection, unspecified: Secondary | ICD-10-CM

## 2018-12-07 DIAGNOSIS — E876 Hypokalemia: Secondary | ICD-10-CM

## 2018-12-07 LAB — COMPREHENSIVE METABOLIC PANEL
ALT: 38 U/L (ref 0–44)
AST: 65 U/L — ABNORMAL HIGH (ref 15–41)
Albumin: 2.9 g/dL — ABNORMAL LOW (ref 3.5–5.0)
Alkaline Phosphatase: 62 U/L (ref 38–126)
Anion gap: 11 (ref 5–15)
BUN: <5 mg/dL — ABNORMAL LOW (ref 6–20)
CO2: 26 mmol/L (ref 22–32)
Calcium: 8.9 mg/dL (ref 8.9–10.3)
Chloride: 101 mmol/L (ref 98–111)
Creatinine, Ser: 0.67 mg/dL (ref 0.61–1.24)
GFR calc Af Amer: >60 mL/min (ref >60)
GFR calc non Af Amer: >60 mL/min (ref >60)
Glucose, Bld: 129 mg/dL — ABNORMAL HIGH (ref 70–99)
Potassium: 2.9 mmol/L — ABNORMAL LOW (ref 3.5–5.1)
Sodium: 138 mmol/L (ref 135–145)
Total Bilirubin: 1.3 mg/dL — ABNORMAL HIGH (ref 0.3–1.2)
Total Protein: 6.6 g/dL (ref 6.5–8.1)

## 2018-12-07 LAB — CBC
HCT: 28.6 % — ABNORMAL LOW (ref 39.0–52.0)
Hemoglobin: 10.1 g/dL — ABNORMAL LOW (ref 13.0–17.0)
MCH: 34 pg (ref 26.0–34.0)
MCHC: 35.3 g/dL (ref 30.0–36.0)
MCV: 96.3 fL (ref 80.0–100.0)
Platelets: 133 10*3/uL — ABNORMAL LOW (ref 150–400)
RBC: 2.97 MIL/uL — ABNORMAL LOW (ref 4.22–5.81)
RDW: 15.1 % (ref 11.5–15.5)
WBC: 7 10*3/uL (ref 4.0–10.5)
nRBC: 1.3 % — ABNORMAL HIGH (ref 0.0–0.2)

## 2018-12-07 MED ORDER — POTASSIUM CHLORIDE CRYS ER 20 MEQ PO TBCR
40.0000 meq | EXTENDED_RELEASE_TABLET | ORAL | Status: AC
  Administered 2018-12-07 (×2): 40 meq via ORAL
  Filled 2018-12-07 (×2): qty 2

## 2018-12-07 NOTE — Progress Notes (Addendum)
PROGRESS NOTE  [name] YIF:[id number] DOB: 01-15-1961 DOA: 12/02/2018 PCP: [name], DO  Brief Narrative:  Patient admitted with very interesting episodic abdominal pain that is associated with elevated LFTs, thrombocytopenia.  58 year old gentleman with history of asthma, TIA, thrombocytopenia, coronary artery disease, hypertension, hyperlipidemia, hypothyroidism, GERD and stage II chronic kidney disease and combined heart failure presented to the emergency room with 2 days of vomiting.  Patient had intermittent periumbilical abdominal pain and epigastric pain, vomiting and unable to eat anything since last 2 days.  Interestingly he was admitted to the hospital multiple times in the past with similar symptoms including 1 about a month ago where he underwent extensive evaluation including MRCP that did not show any biliary obstruction, HIDA scan showed nonvisualized gallbladder.  Was seen by surgery and asked to follow-up outpatient as he had improvement in symptoms.  Patient is poor historian, however he states that he was feeling well until 2 days ago.  He has not taken Plavix since Friday as he continued to have vomiting.  Occasional alcohol.  Denies drugs and UDS was negative. In the emergency room, tachypneic and tachycardic, on room air.  Elevated lipase and mild elevation of transaminases.  Metabolic acidosis with anion gap of 31 and mildly elevated creatinine from baseline.  Platelets 43.  CT scan of the abdomen pelvis as well as right upper quadrant ultrasound was normal.  MRCP of abdomen during this hospitalization was also unremarkable.  General surgery consulted IR on 12/05/2018 for percutaneous cholecystostomy tube however IR needed repeat HIDA scan in order to do that.  Repeat HIDA scan on 12/05/2018 was negative.  No indication for cholecystostomy tube per IR.  General surgery consulted cardiology for cardiac clearance and they recommended resuming his aspirin and Plavix  as soon as possible per surgery's discretion as he had DES placed in 6 months ago.  General surgery now plans to do laparoscopic cholecystectomy once pancreatitis resolved and recommends to hold aspirin and Plavix for now    HPI/Recap of past 24 hours:  + ab pain, dry heaving, no vomiting Report left forearm swelling  Assessment/Plan: Principal Problem:   Intractable vomiting with nausea Active Problems:   Essential hypertension   GERD   Thrombocytopenia (HCC)   Hypercholesteremia   Elevated LFTs   Elevated lipase   Metabolic acidosis  Acute pancreatitis, likely gallstone pancreatitis  -reports Chronic and recurrent with severe symptoms with ab pain, n/v. Recent ab [place] from 10/2018 + Small volume of sludge in the gallbladder -no lipid level obtained this hospitalization, but per chart review, it has been checked numerous times in the past, triglyceride ranged from 39-151 in the past. -MRCP 8/11 negative for choledocholithiasis -HIDA 8/13 neg so IR did not place perc chole, no acute cholecystitis -lft trending down -general surgery following, lap chole per general surgery, Patient's  Plavix has been on hold , asa restarted on 8/14  High anion gap metabolic acidosis: Presentation with bicarb of 8, anion gap 31.  Treated with isotonic fluid with clinical improvement.  No osmolar gap.  No evidence of significantly elevated acetone level in the body.  Negative for ethanol, isopropanol and methanol.  Probably related to starvation ketoacidosis.  Treated with aggressive IV fluids and already improving.  Severe hepatic steatosis He does reports 1shots of liquor daily On zetia, follow up with pcp  Thrombocytopenia  Improved, plt 43 on presentation, today is 133  Hypokalemia k 2.9, keep on tele Replace, repeat level in am , check mag  AKI on CKDII -cr on presentation was 1.39, cr today is 0.67 -home meds cozaar  Held since admission  HTN:  Currently on toprol xl, imdur, norvasc,  cozaar  Held since admission, likely able to resume at discharge.   Coronary artery disease: Cardiac cath on 05/2018. DES placed about 6 months ago.    Continue nitrates, beta-blockers, antilipid medications.  per chart review, he is getting Evolocumab [phone] Patient's aspirin and Plavix has been on hold since admission, asa restarted on 8/14.  Patient was seen by cardiology and they recommend resuming these 2 medications as soon as possible per general surgery's discretion.  Please see cards consult note from 8/12. Time to resume plavix per general surgery.   History of depression: On Prozac,   Left forearm swelling, likely iv infiltrate, remove iv, elevate left arm, discussed with RN at bedside  DVT Prophylaxis: scd  Code Status: full  Family Communication: patient   Disposition Plan: after cholecystectomy , needs general surgery clearance   Consultants:  General surgery  IR  cardiology  Procedures:  mrcp on 8/11 negative for choledocholithiasis  HIDA scan on 8/13 negative for acute cholecystitis  Antibiotics:  none   Objective: BP (!) 146/95 (BP Location: Left Arm)   Pulse 87   Temp (!) 97.5 F (36.4 C) (Oral)   Resp 18   Ht 5\' 10"  (1.778 m)   Wt 97.5 kg   SpO2 96%   BMI 30.84 kg/m   Intake/Output Summary (Last 24 hours) at 12/07/2018 0859 Last data filed at 12/06/2018 1831 Gross per 24 hour  Intake 2851.25 ml  Output -  Net 2851.25 ml   Filed Weights   12/02/18 1115 12/02/18 2103  Weight: 99.8 kg 97.5 kg    Exam: Patient is examined daily including today on 12/07/2018, exams remain the same as of yesterday except that has changed    General:  NAD  Cardiovascular: RRR  Respiratory: CTABL  Abdomen: mild epigastric tenderness, no rebound,  positive BS  Musculoskeletal: No Edema  Neuro: alert, oriented   Data Reviewed: Basic Metabolic Panel: Recent Labs  Lab 12/02/18 2249  12/03/18 0704 12/04/18 0210 12/05/18 0519 12/06/18 0248  12/06/18 1433 12/06/18 1702 12/07/18 0307  NA 135   < > 136 140 138 136  --   --  138  K 3.8   < > 3.9 3.6 3.1* 2.9* 3.3* 3.4* 2.9*  CL 97*   < > 99 104 102 99  --   --  101  CO2 15*   < > 17* 19* 22 24  --   --  26  GLUCOSE 143*   < > 141* 121* 119* 113*  --   --  129*  BUN 22*   < > 24* 23* 13 6  --   --  <5*  CREATININE 1.41*   < > 1.33* 1.22 0.88 0.78  --   --  0.67  CALCIUM 9.6   < > 9.6 9.4 9.0 8.7*  --   --  8.9  MG 1.6*  --   --  1.9  --   --   --   --   --    < > = values in this interval not displayed.   Liver Function Tests: Recent Labs  Lab 12/03/18 0244 12/04/18 0210 12/05/18 0519 12/06/18 0248 12/07/18 0307  AST 153* 137* 113* 81* 65*  ALT 68* 57* 49* 42 38  ALKPHOS 63 60 68 65 62  BILITOT 3.3* 2.7* 2.3*  1.6* 1.3*  PROT 7.9 7.0 6.7 6.6 6.6  ALBUMIN 3.8 3.3* 3.1* 2.8* 2.9*   Recent Labs  Lab 12/02/18 1135 12/04/18 0210 12/05/18 0519 12/06/18 0248  LIPASE 346* 173* 278* 215*   No results for input(s): AMMONIA in the last 168 hours. CBC: Recent Labs  Lab 12/02/18 1135  12/03/18 0244 12/04/18 0210 12/05/18 0519 12/06/18 0248 12/07/18 0307  WBC 6.4  --  5.8 7.2 8.2 7.5 7.0  NEUTROABS 4.6  --   --   --   --   --   --   HGB 13.1   < > 12.7* 11.7* 10.5* 9.8* 10.1*  HCT 38.4*   < > 36.2* 32.9* 29.6* 27.8* 28.6*  MCV 97.5  --  97.3 97.1 96.4 97.5 96.3  PLT 43*  --  47* 62* 88* 108* 133*   < > = values in this interval not displayed.   Cardiac Enzymes:   No results for input(s): CKTOTAL, CKMB, CKMBINDEX, TROPONINI in the last 168 hours. BNP (last 3 results) Recent Labs    10/29/18 1003 12/02/18 1135  BNP 37.0 47.0    ProBNP (last 3 results) No results for input(s): PROBNP in the last 8760 hours.  CBG: Recent Labs  Lab 12/02/18 1127  GLUCAP 135*    Recent Results (from the past 240 hour(s))  SARS Coronavirus 2 [facility] order, Performed in [facility] hospital lab) Nasopharyngeal Nasopharyngeal Swab     Status: None   Collection Time:  12/02/18  3:14 PM   Specimen: Nasopharyngeal Swab  Result Value Ref Range Status   SARS Coronavirus 2 NEGATIVE NEGATIVE Final    Comment: (NOTE) If result is NEGATIVE SARS-CoV-2 target nucleic acids are NOT DETECTED. The SARS-CoV-2 RNA is generally detectable in upper and lower  respiratory specimens during the acute phase of infection. The lowest  concentration of SARS-CoV-2 viral copies this assay can detect is 250  copies / mL. A negative result does not preclude SARS-CoV-2 infection  and should not be used as the sole basis for treatment or other  patient management decisions.  A negative result may occur with  improper specimen collection / handling, submission of specimen other  than nasopharyngeal swab, presence of viral mutation(s) within the  areas targeted by this assay, and inadequate number of viral copies  (<250 copies / mL). A negative result must be combined with clinical  observations, patient history, and epidemiological information. If result is POSITIVE SARS-CoV-2 target nucleic acids are DETECTED. The SARS-CoV-2 RNA is generally detectable in upper and lower  respiratory specimens dur ing the acute phase of infection.  Positive  results are indicative of active infection with SARS-CoV-2.  Clinical  correlation with patient history and other diagnostic information is  necessary to determine patient infection status.  Positive results do  not rule out bacterial infection or co-infection with other viruses. If result is PRESUMPTIVE POSTIVE SARS-CoV-2 nucleic acids MAY BE PRESENT.   A presumptive positive result was obtained on the submitted specimen  and confirmed on repeat testing.  While 2019 novel coronavirus  (SARS-CoV-2) nucleic acids may be present in the submitted sample  additional confirmatory testing may be necessary for epidemiological  and / or clinical management purposes  to differentiate between  SARS-CoV-2 and other Sarbecovirus currently known to  infect humans.  If clinically indicated additional testing with an alternate test  methodology [phone]) is advised. The SARS-CoV-2 RNA is generally  detectable in upper and lower respiratory sp ecimens during the acute  phase of infection. The expected result is Negative. Fact Sheet for Patients:  StrictlyIdeas.no Fact Sheet for Healthcare Providers: BankingDealers.co.za This test is not yet approved or cleared by the [place] FDA and has been authorized for detection and/or diagnosis of SARS-CoV-2 by FDA under an Emergency Use Authorization (EUA).  This EUA will remain in effect (meaning this test can be used) for the duration of the COVID-19 declaration under Section 564(b)(1) of the Act, 21 U.S.C. section 360bbb-3(b)(1), unless the authorization is terminated or revoked sooner. Performed at [facility], [address] [place], [place] [address]      Studies: No results found.  Scheduled Meds: . amLODipine  10 mg Oral Daily  . aspirin EC  81 mg Oral Daily  . ezetimibe  10 mg Oral Daily  . feeding supplement (ENSURE ENLIVE)  237 mL Oral BID BM  . FLUoxetine  20 mg Oral Daily  . isosorbide mononitrate  30 mg Oral Daily  . loratadine  10 mg Oral QHS  . metoprolol succinate  50 mg Oral Daily  . potassium chloride  40 mEq Oral Q4H    Continuous Infusions: . sodium chloride 1,000 mL (12/06/18 1758)  . famotidine (PEPCID) IV 20 mg (12/06/18 2200)     Time spent: 3mins I have personally reviewed and interpreted on  12/07/2018 daily labs, tele strips, imagings as discussed above under date review session and assessment and plans.  I reviewed all nursing notes, pharmacy notes, consultant notes,  vitals, pertinent old records  I have discussed plan of care as described above with RN , patient  on 12/07/2018   Florencia Reasons MD, PhD, FACP  [facility] Pager [phone]. If 7PM-7AM, please contact night-coverage  at [email or web address], password [facility] 12/07/2018, 8:59 AM  LOS: 5 days

## 2018-12-07 NOTE — Progress Notes (Signed)
[facility] Progress Note     Subjective: CC-  Ok today. States that he feels hungry. Continues to have some left sided abdominal pain, but overall it is less than upon admission. States that the nausea is worse than the pain. No emesis. Nausea is intermittent, not related to PO intake. BM this AM.  Objective: Vital signs in last 24 hours: Temp:  [97.4 F (36.3 C)-98.6 F (37 C)] 97.5 F (36.4 C) (08/15 0529) Pulse Rate:  [77-87] 87 (08/15 0529) Resp:  [16-18] 18 (08/15 0529) BP: (115-146)/(76-95) 146/95 (08/15 0529) SpO2:  [96 %-98 %] 96 % (08/15 0529) Last BM Date: 12/05/18  Intake/Output from previous day: 08/14 0701 - 08/15 0700 In: 2851.3 [P.O.:300; I.V.:1640.5; IV Piggyback:910.8] Out: -  Intake/Output this shift: No intake/output data recorded.  PE: Gen: Alert, NAD, pleasant HEENT: pupils equal and round Pulm:Rate andeffort normal SWF:UXNAT, soft,nondistended, epigastricand LUQ TTP with no rebound or guarding, +BS, no masses or organomegaly.  Skin: no rashes noted, warm and dry   Lab Results:  Recent Labs    12/06/18 0248 12/07/18 0307  WBC 7.5 7.0  HGB 9.8* 10.1*  HCT 27.8* 28.6*  PLT 108* 133*   BMET Recent Labs    12/06/18 0248  12/06/18 1702 12/07/18 0307  NA 136  --   --  138  K 2.9*   < > 3.4* 2.9*  CL 99  --   --  101  CO2 24  --   --  26  GLUCOSE 113*  --   --  129*  BUN 6  --   --  <5*  CREATININE 0.78  --   --  0.67  CALCIUM 8.7*  --   --  8.9   < > = values in this interval not displayed.   PT/INR No results for input(s): LABPROT, INR in the last 72 hours. CMP     Component Value Date/Time   NA 138 12/07/2018 0307   NA 141 06/07/2018 1443   K 2.9 (L) 12/07/2018 0307   CL 101 12/07/2018 0307   CO2 26 12/07/2018 0307   GLUCOSE 129 (H) 12/07/2018 0307   BUN <5 (L) 12/07/2018 0307   BUN 23 06/07/2018 1443   CREATININE 0.67 12/07/2018 0307   CALCIUM 8.9 12/07/2018 0307   PROT 6.6 12/07/2018 0307   PROT 7.3  05/29/2017 0915   ALBUMIN 2.9 (L) 12/07/2018 0307   ALBUMIN 4.4 05/29/2017 0915   AST 65 (H) 12/07/2018 0307   ALT 38 12/07/2018 0307   ALKPHOS 62 12/07/2018 0307   BILITOT 1.3 (H) 12/07/2018 0307   BILITOT 0.3 05/29/2017 0915   GFRNONAA >60 12/07/2018 0307   GFRAA >60 12/07/2018 0307   Lipase     Component Value Date/Time   LIPASE 215 (H) 12/06/2018 0248       Studies/Results: Nm Hepatobiliary Liver Func  Result Date: 12/05/2018 CLINICAL DATA:  Abdominal pain EXAM: NUCLEAR MEDICINE HEPATOBILIARY IMAGING TECHNIQUE: Sequential images of the abdomen were obtained out to 60 minutes following intravenous administration of radiopharmaceutical. RADIOPHARMACEUTICALS:  5.2 mCi Tc-67m  Choletec IV COMPARISON:  None. FINDINGS: Prompt uptake and biliary excretion of activity by the liver is seen. Gallbladder activity is visualized, consistent with patency of cystic duct. Biliary activity passes into small bowel, consistent with patent common bile duct. IMPRESSION: Normal exam. Electronically Signed   By: Prudencio Pair M.D.   On: 12/05/2018 16:48    Anti-infectives: Anti-infectives (From admission, onward)   None  Assessment/Plan CADs/p STEMI in 2018 with LCx PCI and recent PCI to mid RCA 05/2018 on plavix(last dose 8/7) Chronic combined systolic and diastolic congestive heart failure Asthma HTN AKI onCKDstage III HLD Hypothyroidism Umbilical hernia Thombocytopenia  Acute pancreatitis Elevated LFTs -MRCP 8/11 negative for choledocholithiasis - LFTs slowly trending down, Tbili down to 1.3 - lipase down to 215 (8/14), but patient is still tender on exam - HIDA 8/13 neg so IR did not place perc chole, no acute cholecystitis - recommend taking pt to OR for lap chole once pancreatitis resolves  ID -none. WBC7, afebrile FEN -IVF, CLD VTE -SCDs, per primary, continue to hold plavix and ASA Foley -none Follow up -TBD  Plan: Patient still tender on exam but  overall less so. Continue clears today. Mobilize. Repeat labs in AM.   LOS: 5 days    Wellington Hampshire , [facility] 12/07/2018, 9:16 AM Pager: [phone] Mon-Thurs 7:00 am-4:30 pm Fri 7:00 am -11:30 AM Sat-Sun 7:00 am-11:30 am

## 2018-12-08 DIAGNOSIS — D696 Thrombocytopenia, unspecified: Secondary | ICD-10-CM

## 2018-12-08 DIAGNOSIS — E7800 Pure hypercholesterolemia, unspecified: Secondary | ICD-10-CM

## 2018-12-08 DIAGNOSIS — I10 Essential (primary) hypertension: Secondary | ICD-10-CM

## 2018-12-08 DIAGNOSIS — R945 Abnormal results of liver function studies: Secondary | ICD-10-CM

## 2018-12-08 LAB — COMPREHENSIVE METABOLIC PANEL
ALT: 36 U/L (ref 0–44)
AST: 59 U/L — ABNORMAL HIGH (ref 15–41)
Albumin: 2.9 g/dL — ABNORMAL LOW (ref 3.5–5.0)
Alkaline Phosphatase: 57 U/L (ref 38–126)
Anion gap: 13 (ref 5–15)
BUN: <5 mg/dL — ABNORMAL LOW (ref 6–20)
CO2: 26 mmol/L (ref 22–32)
Calcium: 8.7 mg/dL — ABNORMAL LOW (ref 8.9–10.3)
Chloride: 98 mmol/L (ref 98–111)
Creatinine, Ser: 0.73 mg/dL (ref 0.61–1.24)
GFR calc Af Amer: >60 mL/min (ref >60)
GFR calc non Af Amer: >60 mL/min (ref >60)
Glucose, Bld: 128 mg/dL — ABNORMAL HIGH (ref 70–99)
Potassium: 3 mmol/L — ABNORMAL LOW (ref 3.5–5.1)
Sodium: 137 mmol/L (ref 135–145)
Total Bilirubin: 1.1 mg/dL (ref 0.3–1.2)
Total Protein: 6.5 g/dL (ref 6.5–8.1)

## 2018-12-08 LAB — MAGNESIUM: Magnesium: 1.3 mg/dL — ABNORMAL LOW (ref 1.7–2.4)

## 2018-12-08 LAB — LIPASE, BLOOD: Lipase: 188 U/L — ABNORMAL HIGH (ref 11–51)

## 2018-12-08 LAB — CBC
HCT: 29.7 % — ABNORMAL LOW (ref 39.0–52.0)
Hemoglobin: 10.1 g/dL — ABNORMAL LOW (ref 13.0–17.0)
MCH: 33.8 pg (ref 26.0–34.0)
MCHC: 34 g/dL (ref 30.0–36.0)
MCV: 99.3 fL (ref 80.0–100.0)
Platelets: 129 10*3/uL — ABNORMAL LOW (ref 150–400)
RBC: 2.99 MIL/uL — ABNORMAL LOW (ref 4.22–5.81)
RDW: 15.3 % (ref 11.5–15.5)
WBC: 6.1 10*3/uL (ref 4.0–10.5)
nRBC: 1.5 % — ABNORMAL HIGH (ref 0.0–0.2)

## 2018-12-08 MED ORDER — POTASSIUM CHLORIDE 10 MEQ/100ML IV SOLN
10.0000 meq | INTRAVENOUS | Status: AC
  Administered 2018-12-08 (×4): 10 meq via INTRAVENOUS
  Filled 2018-12-08 (×4): qty 100

## 2018-12-08 MED ORDER — SODIUM CHLORIDE 0.9 % IV SOLN
4.0000 g | Freq: Once | INTRAVENOUS | Status: AC
  Administered 2018-12-08: 4 g via INTRAVENOUS
  Filled 2018-12-08: qty 8

## 2018-12-08 NOTE — Progress Notes (Signed)
[facility] Progress Note     Subjective: CC-  Feeling a little better today than yesterday. Continues to have some left sided abdominal pain but it is less. Nausea also a little less. No emesis. Tolerating clear liquids. BM x2 yesterday. Already ambulated in the halls this morning. Lipase down to 188 from 215  Objective: Vital signs in last 24 hours: Temp:  [97.7 F (36.5 C)-98.1 F (36.7 C)] 98.1 F (36.7 C) (08/16 0525) Pulse Rate:  [74-80] 76 (08/16 0525) Resp:  [17-18] 18 (08/16 0525) BP: (120-149)/(85-103) 145/98 (08/16 0525) SpO2:  [94 %-100 %] 94 % (08/16 0525) Last BM Date: 12/06/18  Intake/Output from previous day: 08/15 0701 - 08/16 0700 In: 3988.3 [P.O.:1050; I.V.:2788.3; IV Piggyback:150] Out: -  Intake/Output this shift: Total I/O In: 222 [P.O.:222] Out: -   PE: Gen: Alert, NAD, pleasant HEENT: pupils equal and round Pulm:Rate andeffort normal VOJ:JKKXF,GHWE,XHBZJIRCVELF, mild epigastricandLUQ TTP withno rebound or guarding, +BS, no masses or organomegaly.  Skin: no rashes noted, warm and dry   Lab Results:  Recent Labs    12/07/18 0307 12/08/18 0238  WBC 7.0 6.1  HGB 10.1* 10.1*  HCT 28.6* 29.7*  PLT 133* 129*   BMET Recent Labs    12/07/18 0307 12/08/18 0238  NA 138 137  K 2.9* 3.0*  CL 101 98  CO2 26 26  GLUCOSE 129* 128*  BUN <5* <5*  CREATININE 0.67 0.73  CALCIUM 8.9 8.7*   PT/INR No results for input(s): LABPROT, INR in the last 72 hours. CMP     Component Value Date/Time   NA 137 12/08/2018 0238   NA 141 06/07/2018 1443   K 3.0 (L) 12/08/2018 0238   CL 98 12/08/2018 0238   CO2 26 12/08/2018 0238   GLUCOSE 128 (H) 12/08/2018 0238   BUN <5 (L) 12/08/2018 0238   BUN 23 06/07/2018 1443   CREATININE 0.73 12/08/2018 0238   CALCIUM 8.7 (L) 12/08/2018 0238   PROT 6.5 12/08/2018 0238   PROT 7.3 05/29/2017 0915   ALBUMIN 2.9 (L) 12/08/2018 0238   ALBUMIN 4.4 05/29/2017 0915   AST 59 (H) 12/08/2018 0238    ALT 36 12/08/2018 0238   ALKPHOS 57 12/08/2018 0238   BILITOT 1.1 12/08/2018 0238   BILITOT 0.3 05/29/2017 0915   GFRNONAA >60 12/08/2018 0238   GFRAA >60 12/08/2018 0238   Lipase     Component Value Date/Time   LIPASE 188 (H) 12/08/2018 0238       Studies/Results: No results found.  Anti-infectives: Anti-infectives (From admission, onward)   None       Assessment/Plan CADs/p STEMI in 2018 with LCx PCI and recent PCI to mid RCA 05/2018 on plavix(last dose 8/7) Chronic combined systolic and diastolic congestive heart failure Asthma HTN AKI onCKDstage III HLD Hypothyroidism Umbilical hernia Thombocytopenia  Acute pancreatitis Elevated LFTs -MRCP 8/11 negative for choledocholithiasis - LFTs slowly trending down, Tbili normalized - lipasedown to 188 from 215 (8/14)  -HIDA 8/13 neg so IR did not place perc chole, no acute cholecystitis - recommend taking pt to OR for lap chole once pancreatitis resolves  ID -none. WBC6.1, afebrile FEN -IVF, CLD, NPO after MN VTE -SCDs, per primary, continue to hold plavix and ASA Foley -none Follow up -TBD  Plan: Lipase coming down and patient less tender on exam. Hopeful we can proceed with surgery in the next couple of day. Will make him NPO after midnight in case he is ready tomorrow. Repeat labs in AM.  LOS: 6 days    Wellington Hampshire , [facility] Surgery 12/08/2018, 11:04 AM Pager: [phone] Mon-Thurs 7:00 am-4:30 pm Fri 7:00 am -11:30 AM Sat-Sun 7:00 am-11:30 am

## 2018-12-08 NOTE — Progress Notes (Signed)
PROGRESS NOTE  [name] ZJI:[id number] DOB: Sep 08, 1960 DOA: 12/02/2018 PCP: [name], DO  Brief Narrative:  Patient admitted with very interesting episodic abdominal pain that is associated with elevated LFTs, thrombocytopenia.  58 year old gentleman with history of asthma, TIA, thrombocytopenia, coronary artery disease, hypertension, hyperlipidemia, hypothyroidism, GERD and stage II chronic kidney disease and combined heart failure presented to the emergency room with 2 days of vomiting.  Patient had intermittent periumbilical abdominal pain and epigastric pain, vomiting and unable to eat anything since last 2 days.  Interestingly he was admitted to the hospital multiple times in the past with similar symptoms including 1 about a month ago where he underwent extensive evaluation including MRCP that did not show any biliary obstruction, HIDA scan showed nonvisualized gallbladder.  Was seen by surgery and asked to follow-up outpatient as he had improvement in symptoms.  Patient is poor historian, however he states that he was feeling well until 2 days ago.  He has not taken Plavix since Friday as he continued to have vomiting.  Occasional alcohol.  Denies drugs and UDS was negative. In the emergency room, tachypneic and tachycardic, on room air.  Elevated lipase and mild elevation of transaminases.  Metabolic acidosis with anion gap of 31 and mildly elevated creatinine from baseline.  Platelets 43.  CT scan of the abdomen pelvis as well as right upper quadrant ultrasound was normal.  MRCP of abdomen during this hospitalization was also unremarkable.  General surgery consulted IR on 12/05/2018 for percutaneous cholecystostomy tube however IR needed repeat HIDA scan in order to do that.  Repeat HIDA scan on 12/05/2018 was negative.  No indication for cholecystostomy tube per IR.  General surgery consulted cardiology for cardiac clearance and they recommended resuming his aspirin and Plavix  as soon as possible per surgery's discretion as he had DES placed in 6 months ago.  General surgery now plans to do laparoscopic cholecystectomy once pancreatitis resolved and recommends to hold Plavix for now    HPI/Recap of past 24 hours:  Continues to have periumbilical abdominal pain, although does feel like it is improving. Had some vomiting early this morning/overnight, but none since then  Assessment/Plan: Principal Problem:   Intractable vomiting with nausea Active Problems:   Essential hypertension   GERD   Thrombocytopenia (HCC)   Hypercholesteremia   Elevated LFTs   Elevated lipase   Metabolic acidosis   Acute pancreatitis   Hypokalemia  Acute pancreatitis, likely gallstone pancreatitis  -reports Chronic and recurrent with severe symptoms with ab pain, n/v. Recent ab [place] from 10/2018 + Small volume of sludge in the gallbladder -no lipid level obtained this hospitalization, but per chart review, it has been checked numerous times in the past, triglyceride ranged from 39-151 in the past. -MRCP 8/11 negative for choledocholithiasis -HIDA 8/13 neg so IR did not place perc chole, no acute cholecystitis -lft trending down -general surgery following, lap chole per general surgery, Patient's  Plavix has been on hold , asa restarted on 8/14  High anion gap metabolic acidosis: Presentation with bicarb of 8, anion gap 31.  Treated with isotonic fluid with clinical improvement.  No osmolar gap.  No evidence of significantly elevated acetone level in the body.  Negative for ethanol, isopropanol and methanol.  Probably related to starvation ketoacidosis.  Treated with aggressive IV fluids. Now resolved.  Severe hepatic steatosis He does reports 1shots of liquor daily On zetia, follow up with pcp  Thrombocytopenia  Improved, plt 43 on presentation, today is  129  Hypokalemia k 3.0, keep on tele Replace, repeat level in am , Mag is also low at 1.3 and will be replaced  AKI on  CKDII -cr on presentation was 1.39, cr today is 0.73 -home meds cozaar  Held since admission  HTN:  Currently on toprol xl, imdur, norvasc, cozaar  Held since admission, likely able to resume at discharge.   Coronary artery disease: Cardiac cath on 05/2018. DES placed about 6 months ago.    Continue nitrates, beta-blockers, antilipid medications.  per chart review, he is getting Evolocumab [phone] Patient is continued on aspirin but Plavix has been on hold since admission.  Patient was seen by cardiology and they recommend resuming these 2 medications as soon as possible per general surgery's discretion.  Please see cards consult note from 8/12. Timing to resume plavix per general surgery.   History of depression: On Prozac,   Left forearm swelling, likely iv infiltrate, remove iv, elevate left arm, discussed with RN at bedside  DVT Prophylaxis: scd  Code Status: full  Family Communication: patient   Disposition Plan: after cholecystectomy , needs general surgery clearance   Consultants:  General surgery  IR  cardiology  Procedures:  mrcp on 8/11 negative for choledocholithiasis  HIDA scan on 8/13 negative for acute cholecystitis  Antibiotics:  none   Objective: BP 125/89 (BP Location: Left Arm)   Pulse 82   Temp 97.7 F (36.5 C) (Oral)   Resp 18   Ht 5\' 10"  (1.778 m)   Wt 97.5 kg   SpO2 98%   BMI 30.84 kg/m   Intake/Output Summary (Last 24 hours) at 12/08/2018 1434 Last data filed at 12/08/2018 1107 Gross per 24 hour  Intake 3856.25 ml  Output -  Net 3856.25 ml   Filed Weights   12/02/18 1115 12/02/18 2103  Weight: 99.8 kg 97.5 kg    Exam: General exam: Alert, awake, oriented x 3 Respiratory system: Clear to auscultation. Respiratory effort normal. Cardiovascular system:RRR. No murmurs, rubs, gallops. Gastrointestinal system: Abdomen is nondistended, soft and tender in periumbilical region. No organomegaly or masses felt. Normal bowel sounds  heard. Central nervous system: Alert and oriented. No focal neurological deficits. Extremities: No C/C/E, +pedal pulses Skin: No rashes, lesions or ulcers  Psychiatry: Judgement and insight appear normal. Mood & affect appropriate.     Data Reviewed: Basic Metabolic Panel: Recent Labs  Lab 12/02/18 2249  12/04/18 0210 12/05/18 0519 12/06/18 0248 12/06/18 1433 12/06/18 1702 12/07/18 0307 12/08/18 0238  NA 135   < > 140 138 136  --   --  138 137  K 3.8   < > 3.6 3.1* 2.9* 3.3* 3.4* 2.9* 3.0*  CL 97*   < > 104 102 99  --   --  101 98  CO2 15*   < > 19* 22 24  --   --  26 26  GLUCOSE 143*   < > 121* 119* 113*  --   --  129* 128*  BUN 22*   < > 23* 13 6  --   --  <5* <5*  CREATININE 1.41*   < > 1.22 0.88 0.78  --   --  0.67 0.73  CALCIUM 9.6   < > 9.4 9.0 8.7*  --   --  8.9 8.7*  MG 1.6*  --  1.9  --   --   --   --   --  1.3*   < > = values in this interval not  displayed.   Liver Function Tests: Recent Labs  Lab 12/04/18 0210 12/05/18 0519 12/06/18 0248 12/07/18 0307 12/08/18 0238  AST 137* 113* 81* 65* 59*  ALT 57* 49* 42 38 36  ALKPHOS 60 68 65 62 57  BILITOT 2.7* 2.3* 1.6* 1.3* 1.1  PROT 7.0 6.7 6.6 6.6 6.5  ALBUMIN 3.3* 3.1* 2.8* 2.9* 2.9*   Recent Labs  Lab 12/02/18 1135 12/04/18 0210 12/05/18 0519 12/06/18 0248 12/08/18 0238  LIPASE 346* 173* 278* 215* 188*   No results for input(s): AMMONIA in the last 168 hours. CBC: Recent Labs  Lab 12/02/18 1135  12/04/18 0210 12/05/18 0519 12/06/18 0248 12/07/18 0307 12/08/18 0238  WBC 6.4   < > 7.2 8.2 7.5 7.0 6.1  NEUTROABS 4.6  --   --   --   --   --   --   HGB 13.1   < > 11.7* 10.5* 9.8* 10.1* 10.1*  HCT 38.4*   < > 32.9* 29.6* 27.8* 28.6* 29.7*  MCV 97.5   < > 97.1 96.4 97.5 96.3 99.3  PLT 43*   < > 62* 88* 108* 133* 129*   < > = values in this interval not displayed.   Cardiac Enzymes:   No results for input(s): CKTOTAL, CKMB, CKMBINDEX, TROPONINI in the last 168 hours. BNP (last 3 results)  Recent Labs    10/29/18 1003 12/02/18 1135  BNP 37.0 47.0    ProBNP (last 3 results) No results for input(s): PROBNP in the last 8760 hours.  CBG: Recent Labs  Lab 12/02/18 1127  GLUCAP 135*    Recent Results (from the past 240 hour(s))  SARS Coronavirus 2 [facility] order, Performed in [facility] hospital lab) Nasopharyngeal Nasopharyngeal Swab     Status: None   Collection Time: 12/02/18  3:14 PM   Specimen: Nasopharyngeal Swab  Result Value Ref Range Status   SARS Coronavirus 2 NEGATIVE NEGATIVE Final    Comment: (NOTE) If result is NEGATIVE SARS-CoV-2 target nucleic acids are NOT DETECTED. The SARS-CoV-2 RNA is generally detectable in upper and lower  respiratory specimens during the acute phase of infection. The lowest  concentration of SARS-CoV-2 viral copies this assay can detect is 250  copies / mL. A negative result does not preclude SARS-CoV-2 infection  and should not be used as the sole basis for treatment or other  patient management decisions.  A negative result may occur with  improper specimen collection / handling, submission of specimen other  than nasopharyngeal swab, presence of viral mutation(s) within the  areas targeted by this assay, and inadequate number of viral copies  (<250 copies / mL). A negative result must be combined with clinical  observations, patient history, and epidemiological information. If result is POSITIVE SARS-CoV-2 target nucleic acids are DETECTED. The SARS-CoV-2 RNA is generally detectable in upper and lower  respiratory specimens dur ing the acute phase of infection.  Positive  results are indicative of active infection with SARS-CoV-2.  Clinical  correlation with patient history and other diagnostic information is  necessary to determine patient infection status.  Positive results do  not rule out bacterial infection or co-infection with other viruses. If result is PRESUMPTIVE POSTIVE SARS-CoV-2 nucleic acids MAY BE  PRESENT.   A presumptive positive result was obtained on the submitted specimen  and confirmed on repeat testing.  While 2019 novel coronavirus  (SARS-CoV-2) nucleic acids may be present in the submitted sample  additional confirmatory testing may be necessary for epidemiological  and /  or clinical management purposes  to differentiate between  SARS-CoV-2 and other Sarbecovirus currently known to infect humans.  If clinically indicated additional testing with an alternate test  methodology [phone]) is advised. The SARS-CoV-2 RNA is generally  detectable in upper and lower respiratory sp ecimens during the acute  phase of infection. The expected result is Negative. Fact Sheet for Patients:  StrictlyIdeas.no Fact Sheet for Healthcare Providers: BankingDealers.co.za This test is not yet approved or cleared by the [place] FDA and has been authorized for detection and/or diagnosis of SARS-CoV-2 by FDA under an Emergency Use Authorization (EUA).  This EUA will remain in effect (meaning this test can be used) for the duration of the COVID-19 declaration under Section 564(b)(1) of the Act, 21 U.S.C. section 360bbb-3(b)(1), unless the authorization is terminated or revoked sooner. Performed at [facility], [address] [place], [place] [address]      Studies: No results found.  Scheduled Meds: . amLODipine  10 mg Oral Daily  . aspirin EC  81 mg Oral Daily  . ezetimibe  10 mg Oral Daily  . feeding supplement (ENSURE ENLIVE)  237 mL Oral BID BM  . FLUoxetine  20 mg Oral Daily  . isosorbide mononitrate  30 mg Oral Daily  . loratadine  10 mg Oral QHS  . metoprolol succinate  50 mg Oral Daily    Continuous Infusions: . sodium chloride 150 mL/hr at 12/08/18 0130  . famotidine (PEPCID) IV 20 mg (12/08/18 1129)     Time spent: 4mins I have personally reviewed and interpreted on  12/08/2018 daily labs, tele strips,  imagings as discussed above under date review session and assessment and plans.  I reviewed all nursing notes, pharmacy notes, consultant notes,  vitals, pertinent old records  I have discussed plan of care as described above with RN , patient  on 12/08/2018   Kathie Dike MD  [facility]  If 7PM-7AM, please contact night-coverage at [email or web address] 12/08/2018, 2:34 PM  LOS: 6 days

## 2018-12-08 NOTE — Progress Notes (Signed)
Pt order for NPO after MN 8/16. Lap Chol not scheduled until 8/17. Have paged the doctor to see if we can continue clear liquid diet and start NPO after midnight 8/17.    Spoke with MD. He clarified that General Surgery is going to try and perform lap chol 8/16 pending labs. Patient has been NPO since 0001 on 12/08/2018. Will continue to monitor.

## 2018-12-08 NOTE — Progress Notes (Signed)
Checked on pt, pt resting with eyes closed, no apparent distress. Will continue to monitor.

## 2018-12-08 NOTE — Progress Notes (Signed)
Pt ambulating in halls with visitor.

## 2018-12-09 ENCOUNTER — Ambulatory Visit: Payer: 59 | Admitting: Gastroenterology

## 2018-12-09 ENCOUNTER — Inpatient Hospital Stay (HOSPITAL_COMMUNITY): Payer: 59 | Admitting: Certified Registered"

## 2018-12-09 ENCOUNTER — Inpatient Hospital Stay (HOSPITAL_COMMUNITY): Payer: 59

## 2018-12-09 ENCOUNTER — Encounter (HOSPITAL_COMMUNITY): Payer: Self-pay | Admitting: Certified Registered"

## 2018-12-09 ENCOUNTER — Encounter (HOSPITAL_COMMUNITY)
Admission: EM | Disposition: A | Discharge status: Home / Self Care | Payer: Self-pay | Source: Home / Self Care | Attending: Family Medicine

## 2018-12-09 HISTORY — PX: CHOLECYSTECTOMY: SHX55

## 2018-12-09 LAB — CBC
HCT: 30 % — ABNORMAL LOW (ref 39.0–52.0)
Hemoglobin: 10.3 g/dL — ABNORMAL LOW (ref 13.0–17.0)
MCH: 34.1 pg — ABNORMAL HIGH (ref 26.0–34.0)
MCHC: 34.3 g/dL (ref 30.0–36.0)
MCV: 99.3 fL (ref 80.0–100.0)
Platelets: 165 10*3/uL (ref 150–400)
RBC: 3.02 MIL/uL — ABNORMAL LOW (ref 4.22–5.81)
RDW: 15.5 % (ref 11.5–15.5)
WBC: 6 10*3/uL (ref 4.0–10.5)
nRBC: 0.8 % — ABNORMAL HIGH (ref 0.0–0.2)

## 2018-12-09 LAB — COMPREHENSIVE METABOLIC PANEL
ALT: 37 U/L (ref 0–44)
AST: 61 U/L — ABNORMAL HIGH (ref 15–41)
Albumin: 2.9 g/dL — ABNORMAL LOW (ref 3.5–5.0)
Alkaline Phosphatase: 61 U/L (ref 38–126)
Anion gap: 12 (ref 5–15)
BUN: <5 mg/dL — ABNORMAL LOW (ref 6–20)
CO2: 26 mmol/L (ref 22–32)
Calcium: 8.5 mg/dL — ABNORMAL LOW (ref 8.9–10.3)
Chloride: 102 mmol/L (ref 98–111)
Creatinine, Ser: 0.65 mg/dL (ref 0.61–1.24)
GFR calc Af Amer: >60 mL/min (ref >60)
GFR calc non Af Amer: >60 mL/min (ref >60)
Glucose, Bld: 108 mg/dL — ABNORMAL HIGH (ref 70–99)
Potassium: 3.2 mmol/L — ABNORMAL LOW (ref 3.5–5.1)
Sodium: 140 mmol/L (ref 135–145)
Total Bilirubin: 1.2 mg/dL (ref 0.3–1.2)
Total Protein: 6.3 g/dL — ABNORMAL LOW (ref 6.5–8.1)

## 2018-12-09 LAB — SURGICAL PCR SCREEN
MRSA, PCR: NEGATIVE
Staphylococcus aureus: POSITIVE — AB

## 2018-12-09 LAB — LIPASE, BLOOD: Lipase: 152 U/L — ABNORMAL HIGH (ref 11–51)

## 2018-12-09 LAB — MAGNESIUM: Magnesium: 1.9 mg/dL (ref 1.7–2.4)

## 2018-12-09 SURGERY — LAPAROSCOPIC CHOLECYSTECTOMY WITH INTRAOPERATIVE CHOLANGIOGRAM
Anesthesia: General | Site: Abdomen

## 2018-12-09 MED ORDER — CEFAZOLIN SODIUM-DEXTROSE 2-4 GM/100ML-% IV SOLN
INTRAVENOUS | Status: AC
  Filled 2018-12-09: qty 100

## 2018-12-09 MED ORDER — POTASSIUM CHLORIDE 10 MEQ/100ML IV SOLN
10.0000 meq | INTRAVENOUS | Status: AC
  Administered 2018-12-09: 10 meq via INTRAVENOUS
  Filled 2018-12-09 (×2): qty 100

## 2018-12-09 MED ORDER — SUGAMMADEX SODIUM 200 MG/2ML IV SOLN
INTRAVENOUS | Status: DC | PRN
  Administered 2018-12-09: 300 mg via INTRAVENOUS

## 2018-12-09 MED ORDER — 0.9 % SODIUM CHLORIDE (POUR BTL) OPTIME
TOPICAL | Status: DC | PRN
  Administered 2018-12-09: 09:00:00 1000 mL

## 2018-12-09 MED ORDER — FENTANYL CITRATE (PF) 100 MCG/2ML IJ SOLN
INTRAMUSCULAR | Status: DC | PRN
  Administered 2018-12-09 (×2): 100 ug via INTRAVENOUS
  Administered 2018-12-09: 50 ug via INTRAVENOUS

## 2018-12-09 MED ORDER — PHENYLEPHRINE 40 MCG/ML (10ML) SYRINGE FOR IV PUSH (FOR BLOOD PRESSURE SUPPORT)
PREFILLED_SYRINGE | INTRAVENOUS | Status: AC
  Filled 2018-12-09: qty 10

## 2018-12-09 MED ORDER — MIDAZOLAM HCL 2 MG/2ML IJ SOLN
INTRAMUSCULAR | Status: AC
  Filled 2018-12-09: qty 2

## 2018-12-09 MED ORDER — MUPIROCIN 2 % EX OINT
1.0000 "application " | TOPICAL_OINTMENT | Freq: Two times a day (BID) | CUTANEOUS | Status: DC
  Administered 2018-12-09 – 2018-12-10 (×4): 1 via NASAL
  Filled 2018-12-09: qty 22

## 2018-12-09 MED ORDER — HYDROCORTISONE 1 % EX CREA: 1.0000 "application " | TOPICAL_CREAM | Freq: Three times a day (TID) | CUTANEOUS | Status: DC | PRN

## 2018-12-09 MED ORDER — GABAPENTIN 300 MG PO CAPS
ORAL_CAPSULE | ORAL | Status: AC
  Administered 2018-12-09: 300 mg via ORAL
  Filled 2018-12-09: qty 1

## 2018-12-09 MED ORDER — CHLORHEXIDINE GLUCONATE CLOTH 2 % EX PADS
6.0000 | MEDICATED_PAD | Freq: Once | CUTANEOUS | Status: DC
  Administered 2018-12-09: 6 via TOPICAL

## 2018-12-09 MED ORDER — CHLORHEXIDINE GLUCONATE CLOTH 2 % EX PADS
6.0000 | MEDICATED_PAD | Freq: Every day | CUTANEOUS | Status: DC
  Administered 2018-12-10: 6 via TOPICAL

## 2018-12-09 MED ORDER — FENTANYL CITRATE (PF) 250 MCG/5ML IJ SOLN
INTRAMUSCULAR | Status: AC
  Filled 2018-12-09: qty 5

## 2018-12-09 MED ORDER — SODIUM CHLORIDE 0.9 % IV SOLN
INTRAVENOUS | Status: DC | PRN
  Administered 2018-12-09: 11:00:00 20 mL

## 2018-12-09 MED ORDER — CHLORHEXIDINE GLUCONATE CLOTH 2 % EX PADS: 6.0000 | MEDICATED_PAD | Freq: Once | CUTANEOUS | Status: DC

## 2018-12-09 MED ORDER — LIP MEDEX EX OINT: 1.0000 "application " | TOPICAL_OINTMENT | CUTANEOUS | Status: DC | PRN

## 2018-12-09 MED ORDER — PHENYLEPHRINE 40 MCG/ML (10ML) SYRINGE FOR IV PUSH (FOR BLOOD PRESSURE SUPPORT)
PREFILLED_SYRINGE | INTRAVENOUS | Status: DC | PRN
  Administered 2018-12-09 (×2): 40 ug via INTRAVENOUS
  Administered 2018-12-09: 80 ug via INTRAVENOUS
  Administered 2018-12-09 (×2): 120 ug via INTRAVENOUS
  Administered 2018-12-09: 80 ug via INTRAVENOUS

## 2018-12-09 MED ORDER — LIDOCAINE 2% (20 MG/ML) 5 ML SYRINGE
INTRAMUSCULAR | Status: AC
  Filled 2018-12-09: qty 5

## 2018-12-09 MED ORDER — ALUM & MAG HYDROXIDE-SIMETH 200-200-20 MG/5ML PO SUSP: 30.0000 mL | ORAL | Status: DC | PRN

## 2018-12-09 MED ORDER — ONDANSETRON HCL 4 MG/2ML IJ SOLN
INTRAMUSCULAR | Status: AC
  Filled 2018-12-09: qty 2

## 2018-12-09 MED ORDER — ROCURONIUM BROMIDE 10 MG/ML (PF) SYRINGE
PREFILLED_SYRINGE | INTRAVENOUS | Status: AC
  Filled 2018-12-09: qty 10

## 2018-12-09 MED ORDER — POLYVINYL ALCOHOL 1.4 % OP SOLN: 1.0000 [drp] | OPHTHALMIC | Status: DC | PRN

## 2018-12-09 MED ORDER — GABAPENTIN 300 MG PO CAPS
300.0000 mg | ORAL_CAPSULE | ORAL | Status: AC
  Administered 2018-12-09: 09:00:00 300 mg via ORAL

## 2018-12-09 MED ORDER — SODIUM CHLORIDE 0.9 % IR SOLN
Status: DC | PRN
  Administered 2018-12-09: 1000 mL

## 2018-12-09 MED ORDER — PROMETHAZINE HCL 25 MG/ML IJ SOLN: 6.2500 mg | INTRAMUSCULAR | Status: DC | PRN

## 2018-12-09 MED ORDER — ACETAMINOPHEN 500 MG PO TABS: 1000.0000 mg | ORAL_TABLET | ORAL | Status: DC

## 2018-12-09 MED ORDER — PHENOL 1.4 % MT LIQD: 1.0000 | OROMUCOSAL | Status: DC | PRN

## 2018-12-09 MED ORDER — DEXAMETHASONE SODIUM PHOSPHATE 10 MG/ML IJ SOLN
INTRAMUSCULAR | Status: DC | PRN
  Administered 2018-12-09: 10 mg via INTRAVENOUS

## 2018-12-09 MED ORDER — POLYETHYLENE GLYCOL 3350 17 G PO PACK: 17.0000 g | PACK | Freq: Every day | ORAL | Status: DC | PRN

## 2018-12-09 MED ORDER — POTASSIUM CHLORIDE 10 MEQ/100ML IV SOLN
10.0000 meq | INTRAVENOUS | Status: AC
  Administered 2018-12-09 (×3): 10 meq via INTRAVENOUS
  Filled 2018-12-09 (×2): qty 100

## 2018-12-09 MED ORDER — OXYCODONE-ACETAMINOPHEN 5-325 MG PO TABS: 1.0000 | ORAL_TABLET | ORAL | Status: DC | PRN

## 2018-12-09 MED ORDER — HYDROCODONE-ACETAMINOPHEN 7.5-325 MG PO TABS: 1.0000 | ORAL_TABLET | Freq: Once | ORAL | Status: DC | PRN

## 2018-12-09 MED ORDER — HYDROMORPHONE HCL 1 MG/ML IJ SOLN: 0.2500 mg | INTRAMUSCULAR | Status: DC | PRN

## 2018-12-09 MED ORDER — PROPOFOL 10 MG/ML IV BOLUS
INTRAVENOUS | Status: DC | PRN
  Administered 2018-12-09: 150 mg via INTRAVENOUS

## 2018-12-09 MED ORDER — LIDOCAINE 2% (20 MG/ML) 5 ML SYRINGE
INTRAMUSCULAR | Status: DC | PRN
  Administered 2018-12-09: 100 mg via INTRAVENOUS

## 2018-12-09 MED ORDER — LORATADINE 10 MG PO TABS: 10.0000 mg | ORAL_TABLET | Freq: Every day | ORAL | Status: DC | PRN

## 2018-12-09 MED ORDER — METRONIDAZOLE IN NACL 5-0.79 MG/ML-% IV SOLN
500.0000 mg | INTRAVENOUS | Status: AC
  Administered 2018-12-09: 500 mg via INTRAVENOUS
  Filled 2018-12-09: qty 100

## 2018-12-09 MED ORDER — HYDRALAZINE HCL 20 MG/ML IJ SOLN: 10.0000 mg | INTRAMUSCULAR | Status: DC | PRN

## 2018-12-09 MED ORDER — ROCURONIUM BROMIDE 50 MG/5ML IV SOSY
PREFILLED_SYRINGE | INTRAVENOUS | Status: DC | PRN
  Administered 2018-12-09: 50 mg via INTRAVENOUS
  Administered 2018-12-09: 10 mg via INTRAVENOUS

## 2018-12-09 MED ORDER — ACETAMINOPHEN 500 MG PO TABS
ORAL_TABLET | ORAL | Status: AC
  Filled 2018-12-09: qty 2

## 2018-12-09 MED ORDER — MEPERIDINE HCL 25 MG/ML IJ SOLN: 6.2500 mg | INTRAMUSCULAR | Status: DC | PRN

## 2018-12-09 MED ORDER — CEFAZOLIN SODIUM-DEXTROSE 2-4 GM/100ML-% IV SOLN
2.0000 g | INTRAVENOUS | Status: AC
  Administered 2018-12-09: 2 g via INTRAVENOUS

## 2018-12-09 MED ORDER — SALINE SPRAY 0.65 % NA SOLN: 1.0000 | NASAL | Status: DC | PRN

## 2018-12-09 MED ORDER — SUCCINYLCHOLINE CHLORIDE 200 MG/10ML IV SOSY
PREFILLED_SYRINGE | INTRAVENOUS | Status: DC | PRN
  Administered 2018-12-09: 120 mg via INTRAVENOUS

## 2018-12-09 MED ORDER — HYDROCORTISONE (PERIANAL) 2.5 % EX CREA: 1.0000 "application " | TOPICAL_CREAM | Freq: Four times a day (QID) | CUTANEOUS | Status: DC | PRN

## 2018-12-09 MED ORDER — LACTATED RINGERS IV SOLN
INTRAVENOUS | Status: DC | PRN
  Administered 2018-12-09 (×2): via INTRAVENOUS

## 2018-12-09 MED ORDER — MUSCLE RUB 10-15 % EX CREA: 1.0000 "application " | TOPICAL_CREAM | CUTANEOUS | Status: DC | PRN

## 2018-12-09 MED ORDER — SUCCINYLCHOLINE CHLORIDE 200 MG/10ML IV SOSY
PREFILLED_SYRINGE | INTRAVENOUS | Status: AC
  Filled 2018-12-09: qty 10

## 2018-12-09 MED ORDER — BUPIVACAINE-EPINEPHRINE (PF) 0.25% -1:200000 IJ SOLN
INTRAMUSCULAR | Status: AC
  Filled 2018-12-09: qty 30

## 2018-12-09 MED ORDER — BUPIVACAINE-EPINEPHRINE 0.25% -1:200000 IJ SOLN
INTRAMUSCULAR | Status: DC | PRN
  Administered 2018-12-09: 8 mL
  Administered 2018-12-09: 30 mL

## 2018-12-09 MED ORDER — SODIUM CHLORIDE 0.9 % IV SOLN
INTRAVENOUS | Status: DC | PRN
  Administered 2018-12-09: 50 ug/min via INTRAVENOUS

## 2018-12-09 MED ORDER — EPHEDRINE SULFATE-NACL 50-0.9 MG/10ML-% IV SOSY
PREFILLED_SYRINGE | INTRAVENOUS | Status: DC | PRN
  Administered 2018-12-09: 10 mg via INTRAVENOUS

## 2018-12-09 MED ORDER — MIDAZOLAM HCL 5 MG/5ML IJ SOLN
INTRAMUSCULAR | Status: DC | PRN
  Administered 2018-12-09: 2 mg via INTRAVENOUS

## 2018-12-09 MED ORDER — DEXAMETHASONE SODIUM PHOSPHATE 10 MG/ML IJ SOLN
INTRAMUSCULAR | Status: AC
  Filled 2018-12-09: qty 1

## 2018-12-09 MED ORDER — ACETAMINOPHEN 10 MG/ML IV SOLN: 1000.0000 mg | Freq: Once | INTRAVENOUS | Status: DC | PRN

## 2018-12-09 MED ORDER — CALCIUM CARBONATE ANTACID 500 MG PO CHEW: 3.0000 | CHEWABLE_TABLET | ORAL | Status: DC | PRN

## 2018-12-09 MED ORDER — ALBUMIN HUMAN 5 % IV SOLN
INTRAVENOUS | Status: DC | PRN
  Administered 2018-12-09: 10:00:00 via INTRAVENOUS

## 2018-12-09 MED ORDER — PROPOFOL 10 MG/ML IV BOLUS
INTRAVENOUS | Status: AC
  Filled 2018-12-09: qty 20

## 2018-12-09 MED ORDER — SENNOSIDES-DOCUSATE SODIUM 8.6-50 MG PO TABS: 2.0000 | ORAL_TABLET | Freq: Every evening | ORAL | Status: DC | PRN

## 2018-12-09 MED ORDER — ONDANSETRON HCL 4 MG/2ML IJ SOLN
INTRAMUSCULAR | Status: DC | PRN
  Administered 2018-12-09: 4 mg via INTRAVENOUS

## 2018-12-09 SURGICAL SUPPLY — 45 items
APPLIER CLIP ROT 10 11.4 M/L (STAPLE) ×2
BLADE CLIPPER SURG (BLADE) IMPLANT
CANISTER SUCT 3000ML PPV (MISCELLANEOUS) ×2 IMPLANT
CHLORAPREP W/TINT 26 (MISCELLANEOUS) ×2 IMPLANT
CLIP APPLIE ROT 10 11.4 M/L (STAPLE) ×1 IMPLANT
COVER MAYO STAND STRL (DRAPES) ×2 IMPLANT
COVER SURGICAL LIGHT HANDLE (MISCELLANEOUS) ×2 IMPLANT
COVER WAND RF STERILE (DRAPES) ×1 IMPLANT
DERMABOND ADVANCED (GAUZE/BANDAGES/DRESSINGS) ×1
DERMABOND ADVANCED .7 DNX12 (GAUZE/BANDAGES/DRESSINGS) ×1 IMPLANT
DRAPE C-ARM 42X72 X-RAY (DRAPES) ×2 IMPLANT
DRAPE WARM FLUID 44X44 (DRAPES) ×2 IMPLANT
ELECT REM PT RETURN 9FT ADLT (ELECTROSURGICAL) ×2
ELECTRODE REM PT RTRN 9FT ADLT (ELECTROSURGICAL) ×1 IMPLANT
GLOVE BIO SURGEON STRL SZ8 (GLOVE) ×2 IMPLANT
GLOVE BIOGEL PI IND STRL 6.5 (GLOVE) IMPLANT
GLOVE BIOGEL PI IND STRL 8 (GLOVE) ×1 IMPLANT
GLOVE BIOGEL PI INDICATOR 6.5 (GLOVE) ×2
GLOVE BIOGEL PI INDICATOR 8 (GLOVE) ×1
GLOVE ECLIPSE 6.5 STRL STRAW (GLOVE) ×1 IMPLANT
GLOVE SURG SS PI 6.5 STRL IVOR (GLOVE) ×1 IMPLANT
GOWN STRL REUS W/ TWL LRG LVL3 (GOWN DISPOSABLE) ×2 IMPLANT
GOWN STRL REUS W/ TWL XL LVL3 (GOWN DISPOSABLE) ×1 IMPLANT
GOWN STRL REUS W/TWL LRG LVL3 (GOWN DISPOSABLE) ×2
GOWN STRL REUS W/TWL XL LVL3 (GOWN DISPOSABLE) ×1
KIT BASIN OR (CUSTOM PROCEDURE TRAY) ×2 IMPLANT
KIT TURNOVER KIT B (KITS) ×2 IMPLANT
NS IRRIG 1000ML POUR BTL (IV SOLUTION) ×2 IMPLANT
PAD ARMBOARD 7.5X6 YLW CONV (MISCELLANEOUS) ×2 IMPLANT
POUCH RETRIEVAL ECOSAC 10 (ENDOMECHANICALS) ×1 IMPLANT
POUCH RETRIEVAL ECOSAC 10MM (ENDOMECHANICALS) ×1
SCISSORS LAP 5X35 DISP (ENDOMECHANICALS) ×2 IMPLANT
SET CHOLANGIOGRAPH 5 50 .035 (SET/KITS/TRAYS/PACK) ×2 IMPLANT
SET IRRIG TUBING LAPAROSCOPIC (IRRIGATION / IRRIGATOR) ×2 IMPLANT
SET TUBE SMOKE EVAC HIGH FLOW (TUBING) ×2 IMPLANT
SLEEVE ENDOPATH XCEL 5M (ENDOMECHANICALS) ×3 IMPLANT
SPECIMEN JAR SMALL (MISCELLANEOUS) ×2 IMPLANT
SUT MNCRL AB 4-0 PS2 18 (SUTURE) ×2 IMPLANT
TOWEL GREEN STERILE (TOWEL DISPOSABLE) ×2 IMPLANT
TOWEL GREEN STERILE FF (TOWEL DISPOSABLE) ×1 IMPLANT
TRAY LAPAROSCOPIC MC (CUSTOM PROCEDURE TRAY) ×2 IMPLANT
TROCAR XCEL BLUNT TIP 100MML (ENDOMECHANICALS) ×2 IMPLANT
TROCAR XCEL NON-BLD 11X100MML (ENDOMECHANICALS) ×2 IMPLANT
TROCAR XCEL NON-BLD 5MMX100MML (ENDOMECHANICALS) ×2 IMPLANT
WATER STERILE IRR 1000ML POUR (IV SOLUTION) ×2 IMPLANT

## 2018-12-09 NOTE — H&P (View-Only) (Signed)
[facility]/Trauma Progress Note      Assessment/Plan CADs/p STEMI in 2018 with LCx PCI and recent PCI to mid RCA 05/2018 on plavix(last dose 8/7) Chronic combined systolic and diastolic congestive heart failure Asthma HTN AKI onCKDstage III HLD Hypothyroidism Umbilical hernia Thombocytopenia  Acute pancreatitis Elevated LFTs -MRCP8/11negative for choledocholithiasis - LFTs slowly trending down, Tbili normalized - lipasedown to 152   -HIDA8/13neg so IR did not place perc chole, no acute cholecystitis  ID -none. WBC6.0, afebrile FEN -IVF, NPO VTE -SCDs, per primary, continue to hold plavixand ASA Foley -none Follow up -TBD  Plan: OR today for lap chole     LOS: 7 days    Subjective: CC: abdominal pain  Feeling better today than yesterday. Endorses some nausea but no vomiting. Having flatus. No new issues overnight.   Objective: Vital signs in last 24 hours: Temp:  [97.4 F (36.3 C)-98.4 F (36.9 C)] 98.4 F (36.9 C) (08/17 0527) Pulse Rate:  [65-82] 74 (08/17 0527) Resp:  [18] 18 (08/17 0527) BP: (125-133)/(86-102) 131/86 (08/17 0527) SpO2:  [97 %-98 %] 97 % (08/17 0527) Last BM Date: 12/08/18  Intake/Output from previous day: 08/16 0701 - 08/17 0700 In: 4421.2 [P.O.:1038; I.V.:3283.2; IV Piggyback:100] Out: -  Intake/Output this shift: No intake/output data recorded.  PE: Gen: Alert, NAD, pleasant HEENT: pupils equal and round Pulm:Rate andeffort normal ZOX:WRUEA, soft,nondistended, epigastricTTP without guarding, +BS, no masses or organomegaly.  Skin: no rashes noted, warm and dry  Anti-infectives: Anti-infectives (From admission, onward)   None      Lab Results:  Recent Labs    12/08/18 0238 12/09/18 0335  WBC 6.1 6.0  HGB 10.1* 10.3*  HCT 29.7* 30.0*  PLT 129* 165   BMET Recent Labs    12/08/18 0238 12/09/18 0335  NA 137 140  K 3.0* 3.2*  CL 98 102  CO2 26 26  GLUCOSE 128* 108*   BUN <5* <5*  CREATININE 0.73 0.65  CALCIUM 8.7* 8.5*   PT/INR No results for input(s): LABPROT, INR in the last 72 hours. CMP     Component Value Date/Time   NA 140 12/09/2018 0335   NA 141 06/07/2018 1443   K 3.2 (L) 12/09/2018 0335   CL 102 12/09/2018 0335   CO2 26 12/09/2018 0335   GLUCOSE 108 (H) 12/09/2018 0335   BUN <5 (L) 12/09/2018 0335   BUN 23 06/07/2018 1443   CREATININE 0.65 12/09/2018 0335   CALCIUM 8.5 (L) 12/09/2018 0335   PROT 6.3 (L) 12/09/2018 0335   PROT 7.3 05/29/2017 0915   ALBUMIN 2.9 (L) 12/09/2018 0335   ALBUMIN 4.4 05/29/2017 0915   AST 61 (H) 12/09/2018 0335   ALT 37 12/09/2018 0335   ALKPHOS 61 12/09/2018 0335   BILITOT 1.2 12/09/2018 0335   BILITOT 0.3 05/29/2017 0915   GFRNONAA >60 12/09/2018 0335   GFRAA >60 12/09/2018 0335   Lipase     Component Value Date/Time   LIPASE 152 (H) 12/09/2018 0335    Studies/Results: No results found.    Kalman Drape , [facility] 12/09/2018, 8:05 AM  Pager: [phone] Mon-Wed, Friday 7:00am-4:30pm Thurs 7am-11:30am  Consults: [phone]

## 2018-12-09 NOTE — Progress Notes (Signed)
[facility]/Trauma Progress Note      Assessment/Plan CADs/p STEMI in 2018 with LCx PCI and recent PCI to mid RCA 05/2018 on plavix(last dose 8/7) Chronic combined systolic and diastolic congestive heart failure Asthma HTN AKI onCKDstage III HLD Hypothyroidism Umbilical hernia Thombocytopenia  Acute pancreatitis Elevated LFTs -MRCP8/11negative for choledocholithiasis - LFTs slowly trending down, Tbili normalized - lipasedown to 152   -HIDA8/13neg so IR did not place perc chole, no acute cholecystitis  ID -none. WBC6.0, afebrile FEN -IVF, NPO VTE -SCDs, per primary, continue to hold plavixand ASA Foley -none Follow up -TBD  Plan: OR today for lap chole     LOS: 7 days    Subjective: CC: abdominal pain  Feeling better today than yesterday. Endorses some nausea but no vomiting. Having flatus. No new issues overnight.   Objective: Vital signs in last 24 hours: Temp:  [97.4 F (36.3 C)-98.4 F (36.9 C)] 98.4 F (36.9 C) (08/17 0527) Pulse Rate:  [65-82] 74 (08/17 0527) Resp:  [18] 18 (08/17 0527) BP: (125-133)/(86-102) 131/86 (08/17 0527) SpO2:  [97 %-98 %] 97 % (08/17 0527) Last BM Date: 12/08/18  Intake/Output from previous day: 08/16 0701 - 08/17 0700 In: 4421.2 [P.O.:1038; I.V.:3283.2; IV Piggyback:100] Out: -  Intake/Output this shift: No intake/output data recorded.  PE: Gen: Alert, NAD, pleasant HEENT: pupils equal and round Pulm:Rate andeffort normal YFV:CBSWH, soft,nondistended, epigastricTTP without guarding, +BS, no masses or organomegaly.  Skin: no rashes noted, warm and dry  Anti-infectives: Anti-infectives (From admission, onward)   None      Lab Results:  Recent Labs    12/08/18 0238 12/09/18 0335  WBC 6.1 6.0  HGB 10.1* 10.3*  HCT 29.7* 30.0*  PLT 129* 165   BMET Recent Labs    12/08/18 0238 12/09/18 0335  NA 137 140  K 3.0* 3.2*  CL 98 102  CO2 26 26  GLUCOSE 128* 108*   BUN <5* <5*  CREATININE 0.73 0.65  CALCIUM 8.7* 8.5*   PT/INR No results for input(s): LABPROT, INR in the last 72 hours. CMP     Component Value Date/Time   NA 140 12/09/2018 0335   NA 141 06/07/2018 1443   K 3.2 (L) 12/09/2018 0335   CL 102 12/09/2018 0335   CO2 26 12/09/2018 0335   GLUCOSE 108 (H) 12/09/2018 0335   BUN <5 (L) 12/09/2018 0335   BUN 23 06/07/2018 1443   CREATININE 0.65 12/09/2018 0335   CALCIUM 8.5 (L) 12/09/2018 0335   PROT 6.3 (L) 12/09/2018 0335   PROT 7.3 05/29/2017 0915   ALBUMIN 2.9 (L) 12/09/2018 0335   ALBUMIN 4.4 05/29/2017 0915   AST 61 (H) 12/09/2018 0335   ALT 37 12/09/2018 0335   ALKPHOS 61 12/09/2018 0335   BILITOT 1.2 12/09/2018 0335   BILITOT 0.3 05/29/2017 0915   GFRNONAA >60 12/09/2018 0335   GFRAA >60 12/09/2018 0335   Lipase     Component Value Date/Time   LIPASE 152 (H) 12/09/2018 0335    Studies/Results: No results found.    Kalman Drape , [facility] 12/09/2018, 8:05 AM  Pager: [phone] Mon-Wed, Friday 7:00am-4:30pm Thurs 7am-11:30am  Consults: [phone]

## 2018-12-09 NOTE — Anesthesia Postprocedure Evaluation (Signed)
Anesthesia Post Note  Patient: [name]  Procedure(s) Performed: LAPAROSCOPIC CHOLECYSTECTOMY WITH INTRAOPERATIVE CHOLANGIOGRAM (N/A Abdomen)     Patient location during evaluation: PACU Anesthesia Type: General Level of consciousness: awake and alert Pain management: pain level controlled Vital Signs Assessment: post-procedure vital signs reviewed and stable Respiratory status: spontaneous breathing, nonlabored ventilation, respiratory function stable and patient connected to nasal cannula oxygen Cardiovascular status: blood pressure returned to baseline and stable Postop Assessment: no apparent nausea or vomiting Anesthetic complications: no    Last Vitals:  Vitals:   12/09/18 1145 12/09/18 1408  BP: 111/82 136/74  Pulse: 77 76  Resp: 11 19  Temp:  36.8 C  SpO2: 94% 97%    Last Pain:  Vitals:   12/09/18 1408  TempSrc: Oral  PainSc:                  [place]et [place]

## 2018-12-09 NOTE — Anesthesia Procedure Notes (Signed)
Procedure Name: Intubation Date/Time: 12/09/2018 9:52 AM Performed by: Orlie Dakin, CRNA Pre-anesthesia Checklist: Patient identified, Emergency Drugs available, Suction available and Patient being monitored Patient Re-evaluated:Patient Re-evaluated prior to induction Oxygen Delivery Method: Circle system utilized Preoxygenation: Pre-oxygenation with 100% oxygen Induction Type: IV induction Laryngoscope Size: Mac and 4 Grade View: Grade II Tube type: Oral Tube size: 7.5 mm Number of attempts: 1 Airway Equipment and Method: Stylet Placement Confirmation: ETT inserted through vocal cords under direct vision,  positive ETCO2 and breath sounds checked- equal and bilateral Secured at: 24 cm Tube secured with: Tape Dental Injury: Teeth and Oropharynx as per pre-operative assessment  Comments: 4x4s bite block used.

## 2018-12-09 NOTE — Op Note (Signed)
Laparoscopic Cholecystectomy with IOC Procedure Note  Indications: This patient presents with symptomatic gallbladder disease and will undergo laparoscopic cholecystectomy.  (Patient has GALLSTONE  pancreatitis that has  resolved.  He presents for laparoscopic cholecystectomy today for gallstone pancreatitis.  The procedure has been discussed with the patient. Operative and non operative treatments have been discussed. Risks of surgery include bleeding, infection,  Common bile duct injury,  Injury to the stomach,liver, colon,small intestine, abdominal wall,  Diaphragm,  Major blood vessels,  And the need for an open procedure.  Other risks include worsening of medical problems, death,  DVT and pulmonary embolism, and cardiovascular events.   Medical options have also been discussed. The patient has been informed of long term expectations of surgery and non surgical options,  The patient agrees to proceed.    Pre-operative Diagnosis: Gallstone pancreatitis  Post-operative Diagnosis: Same  Surgeon: Joyice Faster Hiromi Knodel   Assistants: none  Anesthesia: General endotracheal anesthesia and Local anesthesia 0.25.% bupivacaine, with epinephrine  ASA Class: 2  Procedure Details  The patient was seen again in the Holding Room. The risks, benefits, complications, treatment options, and expected outcomes were discussed with the patient. The possibilities of reaction to medication, pulmonary aspiration, perforation of viscus, bleeding, recurrent infection, finding a normal gallbladder, the need for additional procedures, failure to diagnose a condition, the possible need to convert to an open procedure, and creating a complication requiring transfusion or operation were discussed with the patient. The patient and/or family concurred with the proposed plan, giving informed consent. The site of surgery properly noted/marked. The patient was taken to Operating Room, identified as [name] and the procedure  verified as Laparoscopic Cholecystectomy with Intraoperative Cholangiograms. A Time Out was held and the above information confirmed.  Prior to the induction of general anesthesia, antibiotic prophylaxis was administered. General endotracheal anesthesia was then administered and tolerated well. After the induction, the abdomen was prepped in the usual sterile fashion. The patient was positioned in the supine position with the left arm comfortably tucked, along with some reverse Trendelenburg.  5 mm Optiview port placed in the left upper quadrant due to previous surgery without difficulty.  The port was advanced through the abdominal layers without injury and pneumoperitoneum established.  Local anesthetic agent was injected into the skin near the umbilicus and an incision made. The midline fascia was incised and the Hasson technique was used to introduce a 12 mm port under direct vision. It was secured with a figure of eight Vicryl suture placed in the usual fashion. Pneumoperitoneum was then created with CO2 and tolerated well without any adverse changes in the patient's vital signs. Additional trocars were introduced under direct vision with an 11 mm trocar in the epigastrium and two  5 mm trocars in the right upper quadrant. All skin incisions were infiltrated with a local anesthetic agent before making the incision and placing the trocars.   The gallbladder was identified, the fundus grasped and retracted cephalad. Adhesions were lysed bluntly and with the electrocautery where indicated, taking care not to injure any adjacent organs or viscus. The infundibulum was grasped and retracted laterally, exposing the peritoneum overlying the triangle of Calot. This was then divided and exposed in a blunt fashion. The cystic duct was clearly identified and bluntly dissected circumferentially. The junctions of the gallbladder, cystic duct and common bile duct were clearly identified prior to the division of any  linear structure.   An incision was made in the cystic duct and the cholangiogram catheter  introduced. The catheter was secured using an endoclip. The study showed no stones and good visualization of the distal and proximal biliary tree. The catheter was then removed.   The cystic duct was then  ligated with surgical clips  on the patient side and  clipped on the gallbladder side and divided. The cystic artery was identified, dissected free, ligated with clips and divided as well. Posterior cystic artery clipped and divided.  The gallbladder was dissected from the liver bed in retrograde fashion with the electrocautery. The gallbladder was removed. The liver bed was irrigated and inspected. Hemostasis was achieved with the electrocautery. Copious irrigation was utilized and was repeatedly aspirated until clear all particulate matter. Hemostasis was achieved with no signs  Of bleeding or bile leakage.  Pneumoperitoneum was completely reduced after viewing removal of the trocars under direct vision. The wound was thoroughly irrigated and the fascia was then closed with a figure of eight suture; the skin was then closed with 4-0 Monocryl.  And a sterile dressing was applied.  Instrument, sponge, and needle counts were correct at closure and at the conclusion of the case.   Findings: Cholecystitis with Cholelithiasis  Estimated Blood Loss: Minimal         Drains: None         Total IV Fluids: Per record         Specimens: Gallbladder           Complications: None; patient tolerated the procedure well.         Disposition: PACU - hemodynamically stable.         Condition: stable

## 2018-12-09 NOTE — Plan of Care (Signed)

## 2018-12-09 NOTE — Interval H&P Note (Signed)
History and Physical Interval Note:  12/09/2018 9:16 AM  [name]  has presented today for surgery, with the diagnosis of Chronic Cholecystitis.  The various methods of treatment have been discussed with the patient and family. After consideration of risks, benefits and other options for treatment, the patient has consented to  Procedure(s): LAPAROSCOPIC CHOLECYSTECTOMY WITH POSSIBLE INTRAOPERATIVE CHOLANGIOGRAM (N/A) as a surgical intervention.  The patient's history has been reviewed, patient examined, no change in status, stable for surgery.  I have reviewed the patient's chart and labs.  Questions were answered to the patient's satisfaction.     [place]

## 2018-12-09 NOTE — Progress Notes (Signed)
PROGRESS NOTE    [name]  WGY:[id number] DOB: 28-Dec-1960 DOA: 12/02/2018 PCP: [name], DO   Brief Narrative:  58 year old with history of asthma, TIA, thrombocytopenia, CKD, essential hypertension, hyperlipidemia, CKD stage II, hypothyroidism, combined CHF came to the ER with vomiting.  Reported of periumbilical abdominal pain.  Extensive work-up including MRCP and HIDA scan was -9-month ago.  Upon admission had transaminitis, elevated anion gap, thrombocytopenia.  CT of the abdomen pelvis, right upper quadrant ultrasound, MRCP-unremarkable.    Cardiology consulted for clearance as patient is on Plavix for recent stent 6 months ago.   Assessment & Plan:   Principal Problem:   Intractable vomiting with nausea Active Problems:   Essential hypertension   GERD   Thrombocytopenia (HCC)   Hypercholesteremia   Elevated LFTs   Elevated lipase   Metabolic acidosis   Acute pancreatitis   Hypokalemia   Acute gallstone pancreatitis -Chronic versus recurrent?. - MRCP 8/11-negative.  HIDA 8/13-negative. - Plavix on hold.  Plan for lap chole with intraoperative cholangiogram per general surgery.  Anion gap metabolic acidosis - Resolved with IV fluids.  Severe hepatic steatosis - Drinks 1 drink daily.  On Zetia.  Thrombocytopenia -Improving.  No obvious signs of bleeding.  Acute kidney injury on CKD stage II - Baseline creatinine 1.0.  Improved.  Cozaar on hold.  Coronary artery disease status post drug-eluting stent 6 months ago -Continue home medications.  Currently on aspirin.  Plavix on hold, to be resumed after surgery.  History of depression -Continue Prozac  Left arm swelling concerning for thrombophlebitis/superficial - Warm compresses.  Elevate arm.   DVT prophylaxis: SCDs Code Status: Full code Family Communication: Aunt at bedside Disposition Plan: Maintain hospital stay for surgical intervention  Consultants:   General surgery  IR   Cardiology  Procedures:   MRCP 8/11-negative  HIDA scan 8/13-negative  Antimicrobials:   None   Subjective: Patient seen right after his procedure.  Reported of some right upper quadrant surgical site pain but otherwise no other complaints.  Denied any nausea, vomiting.  His aunt was at the bedside.  Review of Systems Otherwise negative except as per HPI, including: General: Denies fever, chills, night sweats or unintended weight loss. Resp: Denies cough, wheezing, shortness of breath. Cardiac: Denies chest pain, palpitations, orthopnea, paroxysmal nocturnal dyspnea. GI: Denies abdominal pain, nausea, vomiting, diarrhea or constipation GU: Denies dysuria, frequency, hesitancy or incontinence MS: Denies muscle aches, joint pain or swelling Neuro: Denies headache, neurologic deficits (focal weakness, numbness, tingling), abnormal gait Psych: Denies anxiety, depression, SI/HI/AVH Skin: Denies new rashes or lesions ID: Denies sick contacts, exotic exposures, travel  Objective: Vitals:   12/08/18 1333 12/08/18 2217 12/08/18 2219 12/09/18 0527  BP: 125/89 (!) 133/102 (!) 130/92 131/86  Pulse: 82 65 67 74  Resp: 18   18  Temp: 97.7 F (36.5 C) (!) 97.4 F (36.3 C)  98.4 F (36.9 C)  TempSrc: Oral Oral  Oral  SpO2: 98% 98%  97%  Weight:      Height:        Intake/Output Summary (Last 24 hours) at 12/09/2018 0751 Last data filed at 12/09/2018 0400 Gross per 24 hour  Intake 4421.16 ml  Output -  Net 4421.16 ml   Filed Weights   12/02/18 1115 12/02/18 2103  Weight: 99.8 kg 97.5 kg    Examination:  General exam: Appears calm and comfortable  Respiratory system: Clear to auscultation. Respiratory effort normal. Cardiovascular system: S1 & S2 heard, RRR. No JVD, murmurs,  rubs, gallops or clicks. No pedal edema. Gastrointestinal system: Abdomen is nondistended, soft and nontender. No organomegaly or masses felt. Normal bowel sounds heard. Central nervous system: Alert  and oriented. No focal neurological deficits. Extremities: Symmetric 5 x 5 power. Skin: No rashes, lesions or ulcers Psychiatry: Judgement and insight appear normal. Mood & affect appropriate.     Data Reviewed:   CBC: Recent Labs  Lab 12/02/18 1135  12/05/18 0519 12/06/18 0248 12/07/18 0307 12/08/18 0238 12/09/18 0335  WBC 6.4   < > 8.2 7.5 7.0 6.1 6.0  NEUTROABS 4.6  --   --   --   --   --   --   HGB 13.1   < > 10.5* 9.8* 10.1* 10.1* 10.3*  HCT 38.4*   < > 29.6* 27.8* 28.6* 29.7* 30.0*  MCV 97.5   < > 96.4 97.5 96.3 99.3 99.3  PLT 43*   < > 88* 108* 133* 129* 165   < > = values in this interval not displayed.   Basic Metabolic Panel: Recent Labs  Lab 12/02/18 2249  12/04/18 0210 12/05/18 0519 12/06/18 0248 12/06/18 1433 12/06/18 1702 12/07/18 0307 12/08/18 0238 12/09/18 0335  NA 135   < > 140 138 136  --   --  138 137 140  K 3.8   < > 3.6 3.1* 2.9* 3.3* 3.4* 2.9* 3.0* 3.2*  CL 97*   < > 104 102 99  --   --  101 98 102  CO2 15*   < > 19* 22 24  --   --  26 26 26   GLUCOSE 143*   < > 121* 119* 113*  --   --  129* 128* 108*  BUN 22*   < > 23* 13 6  --   --  <5* <5* <5*  CREATININE 1.41*   < > 1.22 0.88 0.78  --   --  0.67 0.73 0.65  CALCIUM 9.6   < > 9.4 9.0 8.7*  --   --  8.9 8.7* 8.5*  MG 1.6*  --  1.9  --   --   --   --   --  1.3* 1.9   < > = values in this interval not displayed.   GFR: Estimated Creatinine Clearance: 122.2 mL/min (by C-G formula based on SCr of 0.65 mg/dL). Liver Function Tests: Recent Labs  Lab 12/05/18 0519 12/06/18 0248 12/07/18 0307 12/08/18 0238 12/09/18 0335  AST 113* 81* 65* 59* 61*  ALT 49* 42 38 36 37  ALKPHOS 68 65 62 57 61  BILITOT 2.3* 1.6* 1.3* 1.1 1.2  PROT 6.7 6.6 6.6 6.5 6.3*  ALBUMIN 3.1* 2.8* 2.9* 2.9* 2.9*   Recent Labs  Lab 12/04/18 0210 12/05/18 0519 12/06/18 0248 12/08/18 0238 12/09/18 0335  LIPASE 173* 278* 215* 188* 152*   No results for input(s): AMMONIA in the last 168 hours. Coagulation Profile:  Recent Labs  Lab 12/03/18 0244  INR 1.2   Cardiac Enzymes: No results for input(s): CKTOTAL, CKMB, CKMBINDEX, TROPONINI in the last 168 hours. BNP (last 3 results) No results for input(s): PROBNP in the last 8760 hours. HbA1C: No results for input(s): HGBA1C in the last 72 hours. CBG: Recent Labs  Lab 12/02/18 1127  GLUCAP 135*   Lipid Profile: No results for input(s): CHOL, HDL, LDLCALC, TRIG, CHOLHDL, LDLDIRECT in the last 72 hours. Thyroid Function Tests: No results for input(s): TSH, T4TOTAL, FREET4, T3FREE, THYROIDAB in the last 72 hours. Anemia Panel: No  results for input(s): VITAMINB12, FOLATE, FERRITIN, TIBC, IRON, RETICCTPCT in the last 72 hours. Sepsis Labs: Recent Labs  Lab 12/02/18 2249  LATICACIDVEN 1.8    Recent Results (from the past 240 hour(s))  SARS Coronavirus 2 [facility] order, Performed in [facility] hospital lab) Nasopharyngeal Nasopharyngeal Swab     Status: None   Collection Time: 12/02/18  3:14 PM   Specimen: Nasopharyngeal Swab  Result Value Ref Range Status   SARS Coronavirus 2 NEGATIVE NEGATIVE Final    Comment: (NOTE) If result is NEGATIVE SARS-CoV-2 target nucleic acids are NOT DETECTED. The SARS-CoV-2 RNA is generally detectable in upper and lower  respiratory specimens during the acute phase of infection. The lowest  concentration of SARS-CoV-2 viral copies this assay can detect is 250  copies / mL. A negative result does not preclude SARS-CoV-2 infection  and should not be used as the sole basis for treatment or other  patient management decisions.  A negative result may occur with  improper specimen collection / handling, submission of specimen other  than nasopharyngeal swab, presence of viral mutation(s) within the  areas targeted by this assay, and inadequate number of viral copies  (<250 copies / mL). A negative result must be combined with clinical  observations, patient history, and epidemiological information. If result is  POSITIVE SARS-CoV-2 target nucleic acids are DETECTED. The SARS-CoV-2 RNA is generally detectable in upper and lower  respiratory specimens dur ing the acute phase of infection.  Positive  results are indicative of active infection with SARS-CoV-2.  Clinical  correlation with patient history and other diagnostic information is  necessary to determine patient infection status.  Positive results do  not rule out bacterial infection or co-infection with other viruses. If result is PRESUMPTIVE POSTIVE SARS-CoV-2 nucleic acids MAY BE PRESENT.   A presumptive positive result was obtained on the submitted specimen  and confirmed on repeat testing.  While 2019 novel coronavirus  (SARS-CoV-2) nucleic acids may be present in the submitted sample  additional confirmatory testing may be necessary for epidemiological  and / or clinical management purposes  to differentiate between  SARS-CoV-2 and other Sarbecovirus currently known to infect humans.  If clinically indicated additional testing with an alternate test  methodology [phone]) is advised. The SARS-CoV-2 RNA is generally  detectable in upper and lower respiratory sp ecimens during the acute  phase of infection. The expected result is Negative. Fact Sheet for Patients:  StrictlyIdeas.no Fact Sheet for Healthcare Providers: BankingDealers.co.za This test is not yet approved or cleared by the [place] FDA and has been authorized for detection and/or diagnosis of SARS-CoV-2 by FDA under an Emergency Use Authorization (EUA).  This EUA will remain in effect (meaning this test can be used) for the duration of the COVID-19 declaration under Section 564(b)(1) of the Act, 21 U.S.C. section 360bbb-3(b)(1), unless the authorization is terminated or revoked sooner. Performed at [facility], [address], [place], [place] [address]   Surgical PCR screen     Status: Abnormal    Collection Time: 12/09/18  1:55 AM   Specimen: Nasal Mucosa; Nasal Swab  Result Value Ref Range Status   MRSA, PCR NEGATIVE NEGATIVE Final   Staphylococcus aureus POSITIVE (A) NEGATIVE Final    Comment: (NOTE) The Xpert SA Assay (FDA approved for NASAL specimens in patients 51 years of age and older), is one component of a comprehensive surveillance program. It is not intended to diagnose infection nor to guide or monitor treatment. Performed  at [facility], [place] [address], [place], [place] [address]          Radiology Studies: No results found.      Scheduled Meds: . amLODipine  10 mg Oral Daily  . aspirin EC  81 mg Oral Daily  . Chlorhexidine Gluconate Cloth  6 each Topical Daily  . ezetimibe  10 mg Oral Daily  . feeding supplement (ENSURE ENLIVE)  237 mL Oral BID BM  . FLUoxetine  20 mg Oral Daily  . isosorbide mononitrate  30 mg Oral Daily  . loratadine  10 mg Oral QHS  . metoprolol succinate  50 mg Oral Daily  . mupirocin ointment  1 application Nasal BID   Continuous Infusions: . sodium chloride 150 mL/hr at 12/09/18 0048  . famotidine (PEPCID) IV 20 mg (12/08/18 2105)     LOS: 7 days   Time spent= 25 mins    Tunisha Ruland Arsenio Loader, MD [facility]  If 7PM-7AM, please contact night-coverage [email or web address] 12/09/2018, 7:51 AM

## 2018-12-09 NOTE — Transfer of Care (Signed)
Immediate Anesthesia Transfer of Care Note  Patient: [name]  Procedure(s) Performed: LAPAROSCOPIC CHOLECYSTECTOMY WITH INTRAOPERATIVE CHOLANGIOGRAM (N/A Abdomen)  Patient Location: PACU  Anesthesia Type:General  Level of Consciousness: awake and patient cooperative  Airway & Oxygen Therapy: Patient Spontanous Breathing and Patient connected to nasal cannula oxygen  Post-op Assessment: Report given to RN and Post -op Vital signs reviewed and stable  Post vital signs: Reviewed and stable  Last Vitals:  Vitals Value Taken Time  BP 134/91 12/09/18 1116  Temp    Pulse 87 12/09/18 1117  Resp 13 12/09/18 1117  SpO2 97 % 12/09/18 1117  Vitals shown include unvalidated device data.  Last Pain:  Vitals:   12/09/18 0906  TempSrc:   PainSc: Asleep      Patients Stated Pain Goal: 5 (51/83/43 [id number])  Complications: No apparent anesthesia complications

## 2018-12-09 NOTE — Progress Notes (Signed)
Initial Nutrition Assessment  DOCUMENTATION CODES:   Obesity unspecified  INTERVENTION:   -RD will follow for diet advancement and add supplements as appropriate -If prolonged NPO/clear liquid diet status is anticipated, consider initiation of nutrition support  NUTRITION DIAGNOSIS:   Inadequate oral intake related to altered GI function as evidenced by NPO status.  GOAL:   Patient will meet greater than or equal to 90% of their needs  MONITOR:   Diet advancement, Labs, Weight trends, Skin, I & O's  REASON FOR ASSESSMENT:   NPO/Clear Liquid Diet    ASSESSMENT:   [name] is a 58 y.o. male with medical history significant of asthma, TIA, thrombocytopenia, CAD, hypertension, hyperlipidemia, hypothyroidism, GERD, diverticulosis, CKD stage II, chronic combined systolic and diastolic congestive heart failure, BPH presenting to the hospital for evaluation of emesis. History limited as patient was somnolent.  Patient reports 2-day history of vomiting.  States he has not been able to keep any food down.  He is also having intermittent periumbilical abdominal pain.  No diarrhea.  He has also been feeling short of breath for the past 2 days.  States he had chest pain 3 weeks ago.  Denies any chest pain at present.  Denies any fevers or chills.  States he takes 1 aspirin daily.  Denies acetaminophen use.  States he drinks alcohol only occasionally.  No additional history could be obtained from him.  Pt admitted with intractable nausea and vomiting, and elevated lipase and LFTS.   8/11- MRCP negative for choledocholithiasis 8/14- HIDA scan negative, no plans for perc chole drain per IR 8/17- s/p lap chole with IOC  Reviewed I/O's: +1.7 L x 24 hours and +18.7 L since admission  Per general surgery notes, pt with gallstone pancreatitis and plan for lap chole with IOC today.   Pt down in OR at time of visit. Unable to perform nutrition-focused physical exam ort obtain further  nutrition-related history at this time. Per H&P, pt with very poor oral intake 2 days PTA, secondary to epigastric pain.   Pt has been NPO/ clear liquid for the past 7 days and has had inadequate nutrition since hospitalization.   Reviewed wt hx; noted pt has experienced a 7.3% wt loss over the past 3 months. While this is not significant for time frame, weight loss is concerning given poor po intake and acute illness.   Medications reviewed and include 0.9% sodium chloride @ 150 ml/hr and IV KCL.   Labs reviewed: K: 3.2 (on IV supplementation), lipase: 152.   Diet Order:   Diet Order            Diet NPO time specified Except for: Sips with Meds  Diet effective midnight              EDUCATION NEEDS:   No education needs have been identified at this time  Skin:  Skin Assessment: Skin Integrity Issues: Skin Integrity Issues:: Incisions Incisions: closed abdomen  Last BM:  12/08/18  Height:   Ht Readings from Last 1 Encounters:  12/02/18 5\' 10"  (1.778 m)    Weight:   Wt Readings from Last 1 Encounters:  12/02/18 97.5 kg    Ideal Body Weight:  75.5 kg  BMI:  Body mass index is 30.84 kg/m.  Estimated Nutritional Needs:   Kcal:  2050-2250  Protein:  100-115 grams  Fluid:  > 2 L    Quinnton Bury A. Jimmye Norman, [place], LDN, [place] Registered Dietitian II Certified Diabetes Care and Education Specialist Pager: [phone]  After hours Pager: [phone]

## 2018-12-09 NOTE — Progress Notes (Signed)
Notified by central tele that patient had a 3-beat run of v-tach.  On assessment, patient is resting comfortably and asymptomatic.  Current rhythm is NSR.  Will continue to monitor.

## 2018-12-09 NOTE — Anesthesia Preprocedure Evaluation (Addendum)
Anesthesia Evaluation  Patient identified by MRN, date of birth, ID band Patient awake    Reviewed: Allergy & Precautions, NPO status , Patient's Chart, lab work & pertinent test results  Airway Mallampati: II  TM Distance: >3 FB Neck ROM: Full    Dental no notable dental hx. (+) Teeth Intact   Pulmonary asthma , former smoker,    Pulmonary exam normal breath sounds clear to auscultation       Cardiovascular Exercise Tolerance: Good hypertension, + CAD  Normal cardiovascular exam Rhythm:Regular Rate:Normal  Ef 60-65%   Neuro/Psych  Headaches, Anxiety TIA   GI/Hepatic hiatal hernia, GERD  ,  Endo/Other  Hypothyroidism   Renal/GU Renal disease     Musculoskeletal negative musculoskeletal ROS (+)   Abdominal   Peds  Hematology negative hematology ROS (+)   Anesthesia Other Findings   Reproductive/Obstetrics                           Anesthesia Physical Anesthesia Plan  ASA: III  Anesthesia Plan: General   Post-op Pain Management:    Induction: Intravenous  PONV Risk Score and Plan: Treatment may vary due to age or medical condition, Ondansetron and Dexamethasone  Airway Management Planned: Oral ETT  Additional Equipment:   Intra-op Plan:   Post-operative Plan: Extubation in OR  Informed Consent: I have reviewed the patients History and Physical, chart, labs and discussed the procedure including the risks, benefits and alternatives for the proposed anesthesia with the patient or authorized representative who has indicated his/her understanding and acceptance.     Dental advisory given  Plan Discussed with:   Anesthesia Plan Comments:        Anesthesia Quick Evaluation

## 2018-12-10 ENCOUNTER — Encounter (HOSPITAL_COMMUNITY): Payer: Self-pay | Admitting: Surgery

## 2018-12-10 LAB — COMPREHENSIVE METABOLIC PANEL
ALT: 62 U/L — ABNORMAL HIGH (ref 0–44)
AST: 157 U/L — ABNORMAL HIGH (ref 15–41)
Albumin: 2.7 g/dL — ABNORMAL LOW (ref 3.5–5.0)
Alkaline Phosphatase: 66 U/L (ref 38–126)
Anion gap: 10 (ref 5–15)
BUN: 6 mg/dL (ref 6–20)
CO2: 25 mmol/L (ref 22–32)
Calcium: 8.3 mg/dL — ABNORMAL LOW (ref 8.9–10.3)
Chloride: 102 mmol/L (ref 98–111)
Creatinine, Ser: 0.79 mg/dL (ref 0.61–1.24)
GFR calc Af Amer: >60 mL/min (ref >60)
GFR calc non Af Amer: >60 mL/min (ref >60)
Glucose, Bld: 123 mg/dL — ABNORMAL HIGH (ref 70–99)
Potassium: 3.6 mmol/L (ref 3.5–5.1)
Sodium: 137 mmol/L (ref 135–145)
Total Bilirubin: 0.8 mg/dL (ref 0.3–1.2)
Total Protein: 6.1 g/dL — ABNORMAL LOW (ref 6.5–8.1)

## 2018-12-10 LAB — CBC
HCT: 27.4 % — ABNORMAL LOW (ref 39.0–52.0)
Hemoglobin: 9.3 g/dL — ABNORMAL LOW (ref 13.0–17.0)
MCH: 33.3 pg (ref 26.0–34.0)
MCHC: 33.9 g/dL (ref 30.0–36.0)
MCV: 98.2 fL (ref 80.0–100.0)
Platelets: 144 10*3/uL — ABNORMAL LOW (ref 150–400)
RBC: 2.79 MIL/uL — ABNORMAL LOW (ref 4.22–5.81)
RDW: 15.3 % (ref 11.5–15.5)
WBC: 8.1 10*3/uL (ref 4.0–10.5)
nRBC: 0.9 % — ABNORMAL HIGH (ref 0.0–0.2)

## 2018-12-10 LAB — LIPASE, BLOOD: Lipase: 102 U/L — ABNORMAL HIGH (ref 11–51)

## 2018-12-10 LAB — MAGNESIUM: Magnesium: 1.6 mg/dL — ABNORMAL LOW (ref 1.7–2.4)

## 2018-12-10 MED ORDER — ACETAMINOPHEN 325 MG PO TABS: 650.0000 mg | ORAL_TABLET | Freq: Four times a day (QID) | ORAL | Status: DC | PRN

## 2018-12-10 MED ORDER — POTASSIUM CHLORIDE CRYS ER 20 MEQ PO TBCR
40.0000 meq | EXTENDED_RELEASE_TABLET | Freq: Once | ORAL | Status: AC
  Administered 2018-12-10: 40 meq via ORAL
  Filled 2018-12-10: qty 2

## 2018-12-10 MED ORDER — ADULT MULTIVITAMIN W/MINERALS CH
1.0000 | ORAL_TABLET | Freq: Every day | ORAL | Status: DC
  Administered 2018-12-10 – 2018-12-11 (×2): 1 via ORAL
  Filled 2018-12-10 (×2): qty 1

## 2018-12-10 MED ORDER — MAGNESIUM OXIDE 400 (241.3 MG) MG PO TABS
800.0000 mg | ORAL_TABLET | Freq: Once | ORAL | Status: AC
  Administered 2018-12-10: 800 mg via ORAL
  Filled 2018-12-10: qty 2

## 2018-12-10 MED ORDER — HYDROMORPHONE HCL 1 MG/ML IJ SOLN
0.5000 mg | INTRAMUSCULAR | Status: DC | PRN
  Administered 2018-12-10 – 2018-12-11 (×4): 0.5 mg via INTRAVENOUS
  Filled 2018-12-10 (×5): qty 0.5

## 2018-12-10 MED ORDER — METHOCARBAMOL 500 MG PO TABS
500.0000 mg | ORAL_TABLET | Freq: Three times a day (TID) | ORAL | Status: DC
  Administered 2018-12-10 – 2018-12-11 (×3): 500 mg via ORAL
  Filled 2018-12-10 (×3): qty 1

## 2018-12-10 MED ORDER — FAMOTIDINE 20 MG PO TABS
20.0000 mg | ORAL_TABLET | Freq: Two times a day (BID) | ORAL | Status: DC
  Administered 2018-12-10 – 2018-12-11 (×3): 20 mg via ORAL
  Filled 2018-12-10 (×3): qty 1

## 2018-12-10 MED ORDER — OXYCODONE HCL 5 MG PO TABS
5.0000 mg | ORAL_TABLET | ORAL | Status: DC | PRN
  Administered 2018-12-10 – 2018-12-11 (×4): 10 mg via ORAL
  Filled 2018-12-10 (×4): qty 2

## 2018-12-10 MED ORDER — SODIUM CHLORIDE 0.9 % IV SOLN
INTRAVENOUS | Status: DC
  Administered 2018-12-11: 02:00:00 via INTRAVENOUS

## 2018-12-10 MED ORDER — BOOST / RESOURCE BREEZE PO LIQD CUSTOM
1.0000 | Freq: Three times a day (TID) | ORAL | Status: DC
  Administered 2018-12-10: 1 via ORAL

## 2018-12-10 NOTE — Progress Notes (Signed)
PROGRESS NOTE    [name]  XHB:[id number] DOB: 1960-12-01 DOA: 12/02/2018 PCP: [name], DO   Brief Narrative:  58 year old with history of asthma, TIA, thrombocytopenia, CKD, essential hypertension, hyperlipidemia, CKD stage II, hypothyroidism, combined CHF came to the ER with vomiting.  Reported of periumbilical abdominal pain.  Extensive work-up including MRCP and HIDA scan was -88-month ago.  Upon admission had transaminitis, elevated anion gap, thrombocytopenia.  CT of the abdomen pelvis, right upper quadrant ultrasound, MRCP-unremarkable.    Cardiology consulted for clearance as patient is on Plavix for recent stent 6 months ago.   Assessment & Plan:   Principal Problem:   Intractable vomiting with nausea Active Problems:   Essential hypertension   GERD   Thrombocytopenia (HCC)   Hypercholesteremia   Elevated LFTs   Elevated lipase   Metabolic acidosis   Acute pancreatitis   Hypokalemia   Acute gallstone pancreatitis Status post laparoscopic cholecystectomy with IOC-8/17 -Abdominal pain still persists.  Describes it as slightly different in nature.  Possible surgical site.  Lipase is relatively stable. - MRCP 8/11-negative.  HIDA 8/13-negative. - Resume Plavix upon discharge. Pain meds adjustemd.   Anion gap metabolic acidosis - Resolved with IV fluids.  Severe hepatic steatosis - Drinks 1 drink daily.  On Zetia.  Thrombocytopenia -Improving.  No obvious signs of bleeding.  Acute kidney injury on CKD stage II - Baseline creatinine 1.0.  Improved.  Cozaar on hold.  Coronary artery disease status post drug-eluting stent 6 months ago -Continue home medications.  Currently on aspirin.  Plavix on hold, to be resumed after surgery.  History of depression -Continue Prozac  Left arm swelling concerning for thrombophlebitis/superficial - Warm compresses.  Elevate arm.   DVT prophylaxis: SCDs Code Status: Full code Family Communication: None  at bedside Disposition Plan: Maintain hospital stay until cleared by general surgery.  Doubt he is ready today given his abdominal pain.  Consultants:   General surgery  IR  Cardiology  Procedures:   MRCP 8/11-negative  HIDA scan 8/13-negative  Antimicrobials:   None   Subjective: Patient is reporting of right-sided abdominal pain tells me it is different than what it used to be before.  No other acute events overnight.  Tolerated his procedure yesterday.  Review of Systems Otherwise negative except as per HPI, including: General = no fevers, chills, dizziness, malaise, fatigue HEENT/EYES = negative for pain, redness, loss of vision, double vision, blurred vision, loss of hearing, sore throat, hoarseness, dysphagia Cardiovascular= negative for chest pain, palpitation, murmurs, lower extremity swelling Respiratory/lungs= negative for shortness of breath, cough, hemoptysis, wheezing, mucus production Gastrointestinal= negative for nausea, vomiting,, melena, hematemesis Genitourinary= negative for Dysuria, Hematuria, Change in Urinary Frequency MSK = Negative for arthralgia, myalgias, Back Pain, Joint swelling  Neurology= Negative for headache, seizures, numbness, tingling  Psychiatry= Negative for anxiety, depression, suicidal and homocidal ideation Allergy/Immunology= Medication/Food allergy as listed  Skin= Negative for Rash, lesions, ulcers, itching   Objective: Vitals:   12/09/18 1145 12/09/18 1408 12/09/18 2133 12/10/18 0603  BP: 111/82 136/74 120/88 (!) 144/92  Pulse: 77 76 72 67  Resp: 11 19 16 16   Temp:  98.2 F (36.8 C) 98.2 F (36.8 C) 97.7 F (36.5 C)  TempSrc:  Oral Oral Oral  SpO2: 94% 97% 91% 97%  Weight:      Height:        Intake/Output Summary (Last 24 hours) at 12/10/2018 1225 Last data filed at 12/10/2018 0400 Gross per 24 hour  Intake 3521.83 ml  Output -  Net 3521.83 ml   Filed Weights   12/02/18 1115 12/02/18 2103  Weight: 99.8 kg  97.5 kg    Examination:  Constitutional: NAD, calm, comfortable Eyes: PERRL, lids and conjunctivae normal ENMT: Mucous membranes are moist. Posterior pharynx clear of any exudate or lesions.Normal dentition.  Neck: normal, supple, no masses, no thyromegaly Respiratory: clear to auscultation bilaterally, no wheezing, no crackles. Normal respiratory effort. No accessory muscle use.  Cardiovascular: Regular rate and rhythm, no murmurs / rubs / gallops. No extremity edema. 2+ pedal pulses. No carotid bruits.  Abdomen: no tenderness, no masses palpated. No hepatosplenomegaly. Bowel sounds positive. Surgical sites noted.  Musculoskeletal: no clubbing / cyanosis. No joint deformity upper and lower extremities. Good ROM, no contractures. Normal muscle tone.  Skin: no rashes, lesions, ulcers. No induration Neurologic: CN 2-12 grossly intact. Sensation intact, DTR normal. Strength 5/5 in all 4.  Psychiatric: Normal judgment and insight. Alert and oriented x 3. Normal mood.    Data Reviewed:   CBC: Recent Labs  Lab 12/06/18 0248 12/07/18 0307 12/08/18 0238 12/09/18 0335 12/10/18 0216  WBC 7.5 7.0 6.1 6.0 8.1  HGB 9.8* 10.1* 10.1* 10.3* 9.3*  HCT 27.8* 28.6* 29.7* 30.0* 27.4*  MCV 97.5 96.3 99.3 99.3 98.2  PLT 108* 133* 129* 165 149*   Basic Metabolic Panel: Recent Labs  Lab 12/04/18 0210  12/06/18 0248  12/06/18 1702 12/07/18 0307 12/08/18 0238 12/09/18 0335 12/10/18 0216  NA 140   < > 136  --   --  138 137 140 137  K 3.6   < > 2.9*   < > 3.4* 2.9* 3.0* 3.2* 3.6  CL 104   < > 99  --   --  101 98 102 102  CO2 19*   < > 24  --   --  26 26 26 25   GLUCOSE 121*   < > 113*  --   --  129* 128* 108* 123*  BUN 23*   < > 6  --   --  <5* <5* <5* 6  CREATININE 1.22   < > 0.78  --   --  0.67 0.73 0.65 0.79  CALCIUM 9.4   < > 8.7*  --   --  8.9 8.7* 8.5* 8.3*  MG 1.9  --   --   --   --   --  1.3* 1.9 1.6*   < > = values in this interval not displayed.   GFR: Estimated Creatinine  Clearance: 122.2 mL/min (by C-G formula based on SCr of 0.79 mg/dL). Liver Function Tests: Recent Labs  Lab 12/06/18 0248 12/07/18 0307 12/08/18 0238 12/09/18 0335 12/10/18 0216  AST 81* 65* 59* 61* 157*  ALT 42 38 36 37 62*  ALKPHOS 65 62 57 61 66  BILITOT 1.6* 1.3* 1.1 1.2 0.8  PROT 6.6 6.6 6.5 6.3* 6.1*  ALBUMIN 2.8* 2.9* 2.9* 2.9* 2.7*   Recent Labs  Lab 12/05/18 0519 12/06/18 0248 12/08/18 0238 12/09/18 0335 12/10/18 0216  LIPASE 278* 215* 188* 152* 102*   No results for input(s): AMMONIA in the last 168 hours. Coagulation Profile: No results for input(s): INR, PROTIME in the last 168 hours. Cardiac Enzymes: No results for input(s): CKTOTAL, CKMB, CKMBINDEX, TROPONINI in the last 168 hours. BNP (last 3 results) No results for input(s): PROBNP in the last 8760 hours. HbA1C: No results for input(s): HGBA1C in the last 72 hours. CBG: No results for input(s): GLUCAP in the last 168 hours.  Lipid Profile: No results for input(s): CHOL, HDL, LDLCALC, TRIG, CHOLHDL, LDLDIRECT in the last 72 hours. Thyroid Function Tests: No results for input(s): TSH, T4TOTAL, FREET4, T3FREE, THYROIDAB in the last 72 hours. Anemia Panel: No results for input(s): VITAMINB12, FOLATE, FERRITIN, TIBC, IRON, RETICCTPCT in the last 72 hours. Sepsis Labs: No results for input(s): PROCALCITON, LATICACIDVEN in the last 168 hours.  Recent Results (from the past 240 hour(s))  SARS Coronavirus 2 [facility] order, Performed in [facility] hospital lab) Nasopharyngeal Nasopharyngeal Swab     Status: None   Collection Time: 12/02/18  3:14 PM   Specimen: Nasopharyngeal Swab  Result Value Ref Range Status   SARS Coronavirus 2 NEGATIVE NEGATIVE Final    Comment: (NOTE) If result is NEGATIVE SARS-CoV-2 target nucleic acids are NOT DETECTED. The SARS-CoV-2 RNA is generally detectable in upper and lower  respiratory specimens during the acute phase of infection. The lowest  concentration of SARS-CoV-2  viral copies this assay can detect is 250  copies / mL. A negative result does not preclude SARS-CoV-2 infection  and should not be used as the sole basis for treatment or other  patient management decisions.  A negative result may occur with  improper specimen collection / handling, submission of specimen other  than nasopharyngeal swab, presence of viral mutation(s) within the  areas targeted by this assay, and inadequate number of viral copies  (<250 copies / mL). A negative result must be combined with clinical  observations, patient history, and epidemiological information. If result is POSITIVE SARS-CoV-2 target nucleic acids are DETECTED. The SARS-CoV-2 RNA is generally detectable in upper and lower  respiratory specimens dur ing the acute phase of infection.  Positive  results are indicative of active infection with SARS-CoV-2.  Clinical  correlation with patient history and other diagnostic information is  necessary to determine patient infection status.  Positive results do  not rule out bacterial infection or co-infection with other viruses. If result is PRESUMPTIVE POSTIVE SARS-CoV-2 nucleic acids MAY BE PRESENT.   A presumptive positive result was obtained on the submitted specimen  and confirmed on repeat testing.  While 2019 novel coronavirus  (SARS-CoV-2) nucleic acids may be present in the submitted sample  additional confirmatory testing may be necessary for epidemiological  and / or clinical management purposes  to differentiate between  SARS-CoV-2 and other Sarbecovirus currently known to infect humans.  If clinically indicated additional testing with an alternate test  methodology [phone]) is advised. The SARS-CoV-2 RNA is generally  detectable in upper and lower respiratory sp ecimens during the acute  phase of infection. The expected result is Negative. Fact Sheet for Patients:  StrictlyIdeas.no Fact Sheet for Healthcare Providers:  BankingDealers.co.za This test is not yet approved or cleared by the [place] FDA and has been authorized for detection and/or diagnosis of SARS-CoV-2 by FDA under an Emergency Use Authorization (EUA).  This EUA will remain in effect (meaning this test can be used) for the duration of the COVID-19 declaration under Section 564(b)(1) of the Act, 21 U.S.C. section 360bbb-3(b)(1), unless the authorization is terminated or revoked sooner. Performed at [facility], [address] [place], [place] [address]   Surgical PCR screen     Status: Abnormal   Collection Time: 12/09/18  1:55 AM   Specimen: Nasal Mucosa; Nasal Swab  Result Value Ref Range Status   MRSA, PCR NEGATIVE NEGATIVE Final   Staphylococcus aureus POSITIVE (A) NEGATIVE Final    Comment: (NOTE) The Xpert  SA Assay (FDA approved for NASAL specimens in patients 65 years of age and older), is one component of a comprehensive surveillance program. It is not intended to diagnose infection nor to guide or monitor treatment. Performed at [facility], [place] [address], [place], [place] [address]          Radiology Studies: Dg Cholangiogram Operative  Result Date: 12/09/2018 CLINICAL DATA:  58 year old male with a history of cholelithiasis EXAM: INTRAOPERATIVE CHOLANGIOGRAM TECHNIQUE: Cholangiographic images from the C-arm fluoroscopic device were submitted for interpretation post-operatively. Please see the procedural report for the amount of contrast and the fluoroscopy time utilized. COMPARISON:  MR December 03, 2018, nuclear medicine study December 05, 2018 FINDINGS: Surgical instruments project over the upper abdomen. There is cannulation of the cystic duct/gallbladder neck, with antegrade infusion of contrast. Caliber of the extrahepatic ductal system within normal limits. No definite filling defect within the extrahepatic ducts identified. Free flow of contrast across the ampulla.  IMPRESSION: Intraoperative cholangiogram demonstrates extrahepatic biliary ducts of unremarkable caliber, with no definite filling defects identified. Free flow of contrast across the ampulla. Please refer to the dictated operative report for full details of intraoperative findings and procedure Electronically Signed   By: Corrie Mckusick D.O.   On: 12/09/2018 12:10        Scheduled Meds: . amLODipine  10 mg Oral Daily  . aspirin EC  81 mg Oral Daily  . Chlorhexidine Gluconate Cloth  6 each Topical Daily  . ezetimibe  10 mg Oral Daily  . famotidine  20 mg Oral BID  . feeding supplement (ENSURE ENLIVE)  237 mL Oral BID BM  . FLUoxetine  20 mg Oral Daily  . isosorbide mononitrate  30 mg Oral Daily  . loratadine  10 mg Oral QHS  . magnesium oxide  800 mg Oral Once  . methocarbamol  500 mg Oral Q8H  . metoprolol succinate  50 mg Oral Daily  . mupirocin ointment  1 application Nasal BID  . potassium chloride  40 mEq Oral Once   Continuous Infusions: . sodium chloride 150 mL/hr at 12/10/18 0400     LOS: 8 days   Time spent= 25 mins    Anshi Jalloh Arsenio Loader, MD [facility]  If 7PM-7AM, please contact night-coverage [email or web address] 12/10/2018, 12:25 PM

## 2018-12-10 NOTE — Discharge Instructions (Signed)
[facility] ° °Please arrive at least 30 min before your appointment to complete your check in paperwork.  If you are unable to arrive 30 min prior to your appointment time we may have to cancel or reschedule you. °LAPAROSCOPIC SURGERY: POST OP INSTRUCTIONS °Always review your discharge instruction sheet given to you by the facility where your surgery was performed. °IF YOU HAVE DISABILITY OR FAMILY LEAVE FORMS, YOU MUST BRING THEM TO THE OFFICE FOR PROCESSING.   °DO NOT GIVE THEM TO YOUR DOCTOR. ° °PAIN CONTROL ° °1. First take acetaminophen (Tylenol) AND/or ibuprofen (Advil) to control your pain after surgery.  Follow directions on package.  Taking acetaminophen (Tylenol) and/or ibuprofen (Advil) regularly after surgery will help to control your pain and lower the amount of prescription pain medication you may need.  You should not take more than 4,000 mg (4 grams) of acetaminophen (Tylenol) in 24 hours.  You should not take ibuprofen (Advil), aleve, motrin, naprosyn or other NSAIDS if you have a history of stomach ulcers or chronic kidney disease.  °2. A prescription for pain medication may be given to you upon discharge.  Take your pain medication as prescribed, if you still have uncontrolled pain after taking acetaminophen (Tylenol) or ibuprofen (Advil). °3. Use ice packs to help control pain. °4. If you need a refill on your pain medication, please contact your pharmacy.  They will contact our office to request authorization. Prescriptions will not be filled after 5pm or on week-ends. ° °HOME MEDICATIONS °5. Take your usually prescribed medications unless otherwise directed. ° °DIET °6. You should follow a light diet the first few days after arrival home.  Be sure to include lots of fluids daily. Avoid fatty, fried foods.  ° °CONSTIPATION °7. It is common to experience some constipation after surgery and if you are taking pain medication.  Increasing fluid intake and taking a stool  softener (such as Colace) will usually help or prevent this problem from occurring.  A mild laxative (Milk of Magnesia or Miralax) should be taken according to package instructions if there are no bowel movements after 48 hours. ° °WOUND/INCISION CARE °8. Most patients will experience some swelling and bruising in the area of the incisions.  Ice packs will help.  Swelling and bruising can take several days to resolve.  °9. Unless discharge instructions indicate otherwise, follow guidelines below  °a. STERI-STRIPS - you may remove your outer bandages 48 hours after surgery, and you may shower at that time.  You have steri-strips (small skin tapes) in place directly over the incision.  These strips should be left on the skin for 7-10 days.   °b. DERMABOND/SKIN GLUE - you may shower in 24 hours.  The glue will flake off over the next 2-3 weeks. °10. Any sutures or staples will be removed at the office during your follow-up visit. ° °ACTIVITIES °11. You may resume regular (light) daily activities beginning the next day--such as daily self-care, walking, climbing stairs--gradually increasing activities as tolerated.  You may have sexual intercourse when it is comfortable.  Refrain from any heavy lifting or straining until approved by your doctor. °a. You may drive when you are no longer taking prescription pain medication, you can comfortably wear a seatbelt, and you can safely maneuver your car and apply brakes. ° °FOLLOW-UP °12. You should see your doctor in the office for a follow-up appointment approximately 2-3 weeks after your surgery.  You should have been given your post-op/follow-up appointment when   your surgery was scheduled.  If you did not receive a post-op/follow-up appointment, make sure that you call for this appointment within a day or two after you arrive home to insure a convenient appointment time. ° °OTHER INSTRUCTIONS ° °WHEN TO CALL YOUR DOCTOR: °1. Fever over 101.0 °2. Inability to  urinate °3. Continued bleeding from incision. °4. Increased pain, redness, or drainage from the incision. °5. Increasing abdominal pain ° °The clinic staff is available to answer your questions during regular business hours.  Please don’t hesitate to call and ask to speak to one of the nurses for clinical concerns.  If you have a medical emergency, go to the nearest emergency room or call 911.  A surgeon from [facility] is always on call at the hospital. °[address], [address], [place], [place]  [address] ? [address] [address], [place], [place]   [address] °[phone] ? [phone] ? FAX [phone] ° ° ° °

## 2018-12-10 NOTE — Progress Notes (Signed)
[facility] Progress Note  1 Day Post-Op  Subjective: CC-  Complaining of abdominal pain. States that the pain is different than prior to surgery. Taking IV dilaudid. He points to his incisions and left hemiabdomen as to where most of his pain is. Some nausea, no emesis. Tolerated solid food for dinner, has not eaten anything yet today. Passing flatus.   Objective: Vital signs in last 24 hours: Temp:  [97.7 F (36.5 C)-98.2 F (36.8 C)] 97.7 F (36.5 C) (08/18 0603) Pulse Rate:  [67-87] 67 (08/18 0603) Resp:  [10-19] 16 (08/18 0603) BP: (111-144)/(74-92) 144/92 (08/18 0603) SpO2:  [91 %-100 %] 97 % (08/18 0603) Last BM Date: 12/08/18  Intake/Output from previous day: 08/17 0701 - 08/18 0700 In: 4771.8 [P.O.:720; I.V.:3351.8; IV Piggyback:700] Out: -  Intake/Output this shift: No intake/output data recorded.  PE: Gen: Alert, NAD, pleasant HEENT: pupils equal and round Pulm:Rate andeffort normal UMP:NTIRW,ERXV,[id number], +BS. Lap incisions cdi without erythema or drainage. TTP over incisions and left hemiabdomen, no peritonitis Skin: no rashes noted, warm and dry   Lab Results:  Recent Labs    12/09/18 0335 12/10/18 0216  WBC 6.0 8.1  HGB 10.3* 9.3*  HCT 30.0* 27.4*  PLT 165 144*   BMET Recent Labs    12/09/18 0335 12/10/18 0216  NA 140 137  K 3.2* 3.6  CL 102 102  CO2 26 25  GLUCOSE 108* 123*  BUN <5* 6  CREATININE 0.65 0.79  CALCIUM 8.5* 8.3*   PT/INR No results for input(s): LABPROT, INR in the last 72 hours. CMP     Component Value Date/Time   NA 137 12/10/2018 0216   NA 141 06/07/2018 1443   K 3.6 12/10/2018 0216   CL 102 12/10/2018 0216   CO2 25 12/10/2018 0216   GLUCOSE 123 (H) 12/10/2018 0216   BUN 6 12/10/2018 0216   BUN 23 06/07/2018 1443   CREATININE 0.79 12/10/2018 0216   CALCIUM 8.3 (L) 12/10/2018 0216   PROT 6.1 (L) 12/10/2018 0216   PROT 7.3 05/29/2017 0915   ALBUMIN 2.7 (L) 12/10/2018 0216   ALBUMIN 4.4  05/29/2017 0915   AST 157 (H) 12/10/2018 0216   ALT 62 (H) 12/10/2018 0216   ALKPHOS 66 12/10/2018 0216   BILITOT 0.8 12/10/2018 0216   BILITOT 0.3 05/29/2017 0915   GFRNONAA >60 12/10/2018 0216   GFRAA >60 12/10/2018 0216   Lipase     Component Value Date/Time   LIPASE 152 (H) 12/09/2018 0335       Studies/Results: Dg Cholangiogram Operative  Result Date: 12/09/2018 CLINICAL DATA:  58 year old [name] with a history of cholelithiasis EXAM: INTRAOPERATIVE CHOLANGIOGRAM TECHNIQUE: Cholangiographic images from the C-arm fluoroscopic device were submitted for interpretation post-operatively. Please see the procedural report for the amount of contrast and the fluoroscopy time utilized. COMPARISON:  MR December 03, 2018, nuclear medicine study December 05, 2018 FINDINGS: Surgical instruments project over the upper abdomen. There is cannulation of the cystic duct/gallbladder neck, with antegrade infusion of contrast. Caliber of the extrahepatic ductal system within normal limits. No definite filling defect within the extrahepatic ducts identified. Free flow of contrast across the ampulla. IMPRESSION: Intraoperative cholangiogram demonstrates extrahepatic biliary ducts of unremarkable caliber, with no definite filling defects identified. Free flow of contrast across the ampulla. Please refer to the dictated operative report for full details of intraoperative findings and procedure Electronically Signed   By: Corrie Mckusick D.O.   On: 12/09/2018 12:10    Anti-infectives: Anti-infectives (From admission,  onward)   Start     Dose/Rate Route Frequency Ordered Stop   12/09/18 0915  ceFAZolin (ANCEF) IVPB 2g/100 mL premix     2 g 200 mL/hr over 30 Minutes Intravenous On call to O.R. 12/09/18 0904 12/09/18 1028   12/09/18 0915  metroNIDAZOLE (FLAGYL) IVPB 500 mg     500 mg 100 mL/hr over 60 Minutes Intravenous On call to O.R. 12/09/18 0904 12/09/18 1011   12/09/18 0913  ceFAZolin (ANCEF) 2-4 GM/100ML-%  IVPB    Note to Pharmacy: Lisette Grinder   : cabinet override      12/09/18 0913 12/09/18 1013       Assessment/Plan CADs/p STEMI in 2018 with LCx PCI and recent PCI to mid RCA 05/2018 on plavix(last dose 8/7) Chronic combined systolic and diastolic congestive heart failure Asthma HTN AKI onCKDstage III HLD Hypothyroidism Umbilical hernia Thombocytopenia  Gallstone pancreatitis S/p laparoscopic cholecystectomy with [facility] 8/17 Dr. Brantley Stage - POD#1 - IOC negative - Tbili WNL today, lipase pending  ID - none currently FEN - IVF, HH diet VTE - SCDs, per primary/ ok for chemical DVT prophylaxis from surgical standpoint Foley - none Follow up - [facility] clinic  Plan - Sore from surgery, will change pain medications to add PO oxycodone and tylenol and PRN, scheduled robaxin. Apply ice to incisions. May also still have some pancreatitis. Check lipase. Mobilize. Not ready for discharge until pain is under better control.   LOS: 8 days    Wellington Hampshire , [facility] 12/10/2018, 10:00 AM Pager: [phone] Mon-Thurs 7:00 am-4:30 pm Fri 7:00 am -11:30 AM Sat-Sun 7:00 am-11:30 am

## 2018-12-10 NOTE — Progress Notes (Signed)
Nutrition Follow-up  RD working remotely.  DOCUMENTATION CODES:   Obesity unspecified  INTERVENTION:   -D/c Ensure Enlive po BID, each supplement provides 350 kcal and 20 grams of protein -Boost Breeze po TID, each supplement provides 250 kcal and 9 grams of protein -MVI with minerals daily  NUTRITION DIAGNOSIS:   Inadequate oral intake related to altered GI function as evidenced by NPO status.  Progressing  GOAL:   Patient will meet greater than or equal to 90% of their needs  Progressing   MONITOR:   Diet advancement, Labs, Weight trends, Skin, I & O's  REASON FOR ASSESSMENT:   NPO/Clear Liquid Diet    ASSESSMENT:   [name] is a 58 y.o. male with medical history significant of asthma, TIA, thrombocytopenia, CAD, hypertension, hyperlipidemia, hypothyroidism, GERD, diverticulosis, CKD stage II, chronic combined systolic and diastolic congestive heart failure, BPH presenting to the hospital for evaluation of emesis. History limited as patient was somnolent.  Patient reports 2-day history of vomiting.  States he has not been able to keep any food down.  He is also having intermittent periumbilical abdominal pain.  No diarrhea.  He has also been feeling short of breath for the past 2 days.  States he had chest pain 3 weeks ago.  Denies any chest pain at present.  Denies any fevers or chills.  States he takes 1 aspirin daily.  Denies acetaminophen use.  States he drinks alcohol only occasionally.  No additional history could be obtained from him.  8/17- s/p lap chole with IOC  Reviewed I/O's: +4.8 L x 24 hours and +23.4 L since admission  Pt tolerated solid foods yesterday (po 50% at dinner), but intake minimal today secondary to abdominal pain. Pt locates pain in surgical incisions and left abdomen. Plan to discharge once pain in under control.   Noted Ensure supplements, however, pt refusing. Will try Boost Breeze due to low fat supplement.   Medications reviewed  and include 0.9% sodium chloride @ 150 ml/hr.   Labs reviewed: K: 3.2, Lipase: 152.   Diet Order:   Diet Order            Diet Heart Room service appropriate? Yes; Fluid consistency: Thin  Diet effective now              EDUCATION NEEDS:   No education needs have been identified at this time  Skin:  Skin Assessment: Skin Integrity Issues: Skin Integrity Issues:: Incisions Incisions: closed abdomen  Last BM:  12/08/18  Height:   Ht Readings from Last 1 Encounters:  12/02/18 5\' 10"  (1.778 m)    Weight:   Wt Readings from Last 1 Encounters:  12/02/18 97.5 kg    Ideal Body Weight:  75.5 kg  BMI:  Body mass index is 30.84 kg/m.  Estimated Nutritional Needs:   Kcal:  2050-2250  Protein:  100-115 grams  Fluid:  > 2 L    Chavy Avera A. Jimmye Norman, RD, LDN, [place] Registered Dietitian II Certified Diabetes Care and Education Specialist Pager: [phone] After hours Pager: [phone]

## 2018-12-11 ENCOUNTER — Inpatient Hospital Stay (HOSPITAL_COMMUNITY): Payer: 59

## 2018-12-11 LAB — CBC
HCT: 26.5 % — ABNORMAL LOW (ref 39.0–52.0)
Hemoglobin: 9.1 g/dL — ABNORMAL LOW (ref 13.0–17.0)
MCH: 33.7 pg (ref 26.0–34.0)
MCHC: 34.3 g/dL (ref 30.0–36.0)
MCV: 98.1 fL (ref 80.0–100.0)
Platelets: 150 10*3/uL (ref 150–400)
RBC: 2.7 MIL/uL — ABNORMAL LOW (ref 4.22–5.81)
RDW: 15.3 % (ref 11.5–15.5)
WBC: 6.9 10*3/uL (ref 4.0–10.5)
nRBC: 1.3 % — ABNORMAL HIGH (ref 0.0–0.2)

## 2018-12-11 LAB — COMPREHENSIVE METABOLIC PANEL
ALT: 60 U/L — ABNORMAL HIGH (ref 0–44)
AST: 108 U/L — ABNORMAL HIGH (ref 15–41)
Albumin: 2.6 g/dL — ABNORMAL LOW (ref 3.5–5.0)
Alkaline Phosphatase: 64 U/L (ref 38–126)
Anion gap: 9 (ref 5–15)
BUN: 5 mg/dL — ABNORMAL LOW (ref 6–20)
CO2: 26 mmol/L (ref 22–32)
Calcium: 8.4 mg/dL — ABNORMAL LOW (ref 8.9–10.3)
Chloride: 103 mmol/L (ref 98–111)
Creatinine, Ser: 0.84 mg/dL (ref 0.61–1.24)
GFR calc Af Amer: >60 mL/min (ref >60)
GFR calc non Af Amer: >60 mL/min (ref >60)
Glucose, Bld: 123 mg/dL — ABNORMAL HIGH (ref 70–99)
Potassium: 3.3 mmol/L — ABNORMAL LOW (ref 3.5–5.1)
Sodium: 138 mmol/L (ref 135–145)
Total Bilirubin: 0.8 mg/dL (ref 0.3–1.2)
Total Protein: 5.8 g/dL — ABNORMAL LOW (ref 6.5–8.1)

## 2018-12-11 LAB — MAGNESIUM: Magnesium: 1.5 mg/dL — ABNORMAL LOW (ref 1.7–2.4)

## 2018-12-11 MED ORDER — POLYETHYLENE GLYCOL 3350 17 G PO PACK: 17.0000 g | PACK | Freq: Every day | ORAL | 0 refills | Status: DC | PRN

## 2018-12-11 MED ORDER — SENNOSIDES-DOCUSATE SODIUM 8.6-50 MG PO TABS: 2.0000 | ORAL_TABLET | Freq: Every evening | ORAL | Status: DC | PRN

## 2018-12-11 MED ORDER — POTASSIUM CHLORIDE CRYS ER 20 MEQ PO TBCR
40.0000 meq | EXTENDED_RELEASE_TABLET | Freq: Once | ORAL | Status: AC
  Administered 2018-12-11: 40 meq via ORAL
  Filled 2018-12-11: qty 2

## 2018-12-11 MED ORDER — TAMSULOSIN HCL 0.4 MG PO CAPS: 0.4000 mg | ORAL_CAPSULE | Freq: Every day | ORAL | 0 refills | Status: DC

## 2018-12-11 MED ORDER — MAGNESIUM OXIDE 400 (241.3 MG) MG PO TABS
800.0000 mg | ORAL_TABLET | ORAL | Status: AC
  Administered 2018-12-11 (×2): 800 mg via ORAL
  Filled 2018-12-11 (×2): qty 2

## 2018-12-11 MED ORDER — PROMETHAZINE HCL 12.5 MG PO TABS: 12.5000 mg | ORAL_TABLET | Freq: Four times a day (QID) | ORAL | 0 refills | Status: DC | PRN

## 2018-12-11 MED ORDER — METHOCARBAMOL 500 MG PO TABS: 500.0000 mg | ORAL_TABLET | Freq: Four times a day (QID) | ORAL | Status: DC

## 2018-12-11 MED ORDER — METHOCARBAMOL 500 MG PO TABS: 500.0000 mg | ORAL_TABLET | Freq: Four times a day (QID) | ORAL | 0 refills | Status: DC | PRN

## 2018-12-11 MED ORDER — OXYCODONE HCL 5 MG PO TABS: 5.0000 mg | ORAL_TABLET | Freq: Four times a day (QID) | ORAL | 0 refills | Status: DC | PRN

## 2018-12-11 NOTE — Consult Note (Signed)
   [facility] CM Inpatient Consult   12/11/2018  [name] Jan 08, 1961 [id number]   Follow-up Note:  Following patient's disposition and needs for [facility] care management services for community follow-up as a benefit of his [employer] plan; with a 33% extreme high risk score for readmissions and hospitalizations.   Patient underwent laparoscopic cholecystectomy with intraoperative cholangiogram on 12/09/18. Called and spoke with patient via hospital room phone. HIPAA verified. He reports that plan is for him to discharge home. Patientendorses Dr. Riki Sheer with [place] [facility] as his primary care provider,listedas providing transition of care follow-up.  Patient reports having no issues or needs with medications (self managed), pharmacy (using [facility], [facility]), transportation (self or family members when needed) and also states that family members will be providing assistance for him in case he needs it. Currently, he denies any needs [facility] Care Management services and declined offer for follow-up calls to monitor recovery post discharge.  If there any changes with disposition and needs, please place a [facility] Care Management consult as appropriate.   For questionsand additional information,please call:   Jaedyn Marrufo A. Ocie Stanzione, BSN, RN-BC [facility] Liaison Cell: [phone]

## 2018-12-11 NOTE — Progress Notes (Signed)
[facility] Progress Note  2 Days Post-Op  Subjective: CC-  Still sore but overall pain different than prior to surgery. Some nausea, no emesis. Tolerating diet. Passing flatus, no BM since surgery. Ambulating without issues. LFTs and lipase trending down postop.  Objective: Vital signs in last 24 hours: Temp:  [98.1 F (36.7 C)-99.6 F (37.6 C)] 99.6 F (37.6 C) (08/19 0617) Pulse Rate:  [66-71] 71 (08/19 0617) Resp:  [16-18] 16 (08/19 0617) BP: (133-149)/(87-92) 149/92 (08/19 0617) SpO2:  [96 %] 96 % (08/19 0617) Last BM Date: 12/08/18  Intake/Output from previous day: No intake/output data recorded. Intake/Output this shift: No intake/output data recorded.  PE: Gen: Alert, NAD, pleasant HEENT: pupils equal and round Pulm:Rate andeffort normal JOA:CZYSA,YTKZ,SWFUXNATFTDD, +BS. Lap incisions cdi without erythema or drainage. TTP over incisions, no peritonitis Skin: no rashes noted, warm and dry   Lab Results:  Recent Labs    12/10/18 0216 12/11/18 0343  WBC 8.1 6.9  HGB 9.3* 9.1*  HCT 27.4* 26.5*  PLT 144* 150   BMET Recent Labs    12/10/18 0216 12/11/18 0343  NA 137 138  K 3.6 3.3*  CL 102 103  CO2 25 26  GLUCOSE 123* 123*  BUN 6 5*  CREATININE 0.79 0.84  CALCIUM 8.3* 8.4*   PT/INR No results for input(s): LABPROT, INR in the last 72 hours. CMP     Component Value Date/Time   NA 138 12/11/2018 0343   NA 141 06/07/2018 1443   K 3.3 (L) 12/11/2018 0343   CL 103 12/11/2018 0343   CO2 26 12/11/2018 0343   GLUCOSE 123 (H) 12/11/2018 0343   BUN 5 (L) 12/11/2018 0343   BUN 23 06/07/2018 1443   CREATININE 0.84 12/11/2018 0343   CALCIUM 8.4 (L) 12/11/2018 0343   PROT 5.8 (L) 12/11/2018 0343   PROT 7.3 05/29/2017 0915   ALBUMIN 2.6 (L) 12/11/2018 0343   ALBUMIN 4.4 05/29/2017 0915   AST 108 (H) 12/11/2018 0343   ALT 60 (H) 12/11/2018 0343   ALKPHOS 64 12/11/2018 0343   BILITOT 0.8 12/11/2018 0343   BILITOT 0.3 05/29/2017  0915   GFRNONAA >60 12/11/2018 0343   GFRAA >60 12/11/2018 0343   Lipase     Component Value Date/Time   LIPASE 102 (H) 12/10/2018 0216       Studies/Results: Dg Abd 1 View  Result Date: 12/11/2018 CLINICAL DATA:  Post op abd distention EXAM: ABDOMEN - 1 VIEW COMPARISON:  CT abdomen pelvis 12/02/2018 FINDINGS: There are nondistended air-filled loops of bowel throughout the abdomen in a nonobstructive pattern. No evidence for intra-abdominal free air on supine radiograph. Several surgical clips are seen in the right upper quadrant. Lung bases are largely excluded from field of view. No acute osseous abnormality in the visualized bones. Multiple radiopaque densities in the pelvis likely represent vascular phleboliths as well as a bladder stone seen on the recent prior CT. IMPRESSION: Nonobstructive bowel gas pattern. Electronically Signed   By: Audie Pinto M.D.   On: 12/11/2018 08:29   Dg Cholangiogram Operative  Result Date: 12/09/2018 CLINICAL DATA:  58 year old male with a history of cholelithiasis EXAM: INTRAOPERATIVE CHOLANGIOGRAM TECHNIQUE: Cholangiographic images from the C-arm fluoroscopic device were submitted for interpretation post-operatively. Please see the procedural report for the amount of contrast and the fluoroscopy time utilized. COMPARISON:  MR December 03, 2018, nuclear medicine study December 05, 2018 FINDINGS: Surgical instruments project over the upper abdomen. There is cannulation of the cystic duct/gallbladder neck, with  antegrade infusion of contrast. Caliber of the extrahepatic ductal system within normal limits. No definite filling defect within the extrahepatic ducts identified. Free flow of contrast across the ampulla. IMPRESSION: Intraoperative cholangiogram demonstrates extrahepatic biliary ducts of unremarkable caliber, with no definite filling defects identified. Free flow of contrast across the ampulla. Please refer to the dictated operative report for full  details of intraoperative findings and procedure Electronically Signed   By: Corrie Mckusick D.O.   On: 12/09/2018 12:10    Anti-infectives: Anti-infectives (From admission, onward)   Start     Dose/Rate Route Frequency Ordered Stop   12/09/18 0915  ceFAZolin (ANCEF) IVPB 2g/100 mL premix     2 g 200 mL/hr over 30 Minutes Intravenous On call to O.R. 12/09/18 0904 12/09/18 1028   12/09/18 0915  metroNIDAZOLE (FLAGYL) IVPB 500 mg     500 mg 100 mL/hr over 60 Minutes Intravenous On call to O.R. 12/09/18 0904 12/09/18 1011   12/09/18 0913  ceFAZolin (ANCEF) 2-4 GM/100ML-% IVPB    Note to Pharmacy: Lisette Grinder   : cabinet override      12/09/18 0913 12/09/18 1013       Assessment/Plan CADs/p STEMI in 2018 with LCx PCI and recent PCI to mid RCA 05/2018 on plavix(last dose 8/7) Chronic combined systolic and diastolic congestive heart failure Asthma HTN AKI onCKDstage III HLD Hypothyroidism Umbilical hernia Thombocytopenia  Gallstone pancreatitis S/p laparoscopic cholecystectomy with [facility] 8/17 Dr. Brantley Stage - POD#2 - IOC negative - lipase and LFTs trending down  ID - none currently FEN - IVF, HH diet VTE - SCDs, per primary/ ok for chemical DVT prophylaxis from surgical standpoint Foley - none Follow up - [facility] clinic  Plan - Stable for discharge from surgical standpoint. Rx for pain and nausea medications sent to pharmacy. Discharge instructions and f/u info on AVS.   LOS: 9 days    Wellington [place] , [facility] 12/11/2018, 9:53 AM Pager: [phone] Mon-Thurs 7:00 am-4:30 pm Fri 7:00 am -11:30 AM Sat-Sun 7:00 am-11:30 am

## 2018-12-11 NOTE — Progress Notes (Signed)
Patient alert oriented x4. Careplan resolved. Pain management improved patient feels comfortable discharging home today. Give po potassium and magnesium per orders. AVS reviewed, piv removed. Discharged home with aunt.

## 2018-12-11 NOTE — Discharge Summary (Signed)
Physician Discharge Summary  [name] TRR:[id number] DOB: April 26, 1960 DOA: 12/02/2018  PCP: [name], DO  Admit date: 12/02/2018 Discharge date: 12/11/2018  Admitted From: Home  Disposition: Home  Recommendations for Outpatient Follow-up:  1. Follow up with PCP in 1-2 weeks 2. Please obtain BMP/CBC in one week your next doctors visit.  3. Pain and nausea medications as been prescribed by general surgery. 4. Bowel regimen prescribed 5. Flomax daily for bladder stone.  If worsens, advised to follow-up with urology   Discharge Condition: Stable CODE STATUS: Full code Diet recommendation: 2 g salt  Brief/Interim Summary: 59 year old with history of asthma, TIA, thrombocytopenia, CKD, essential hypertension, hyperlipidemia, CKD stage II, hypothyroidism, combined CHF came to the ER with vomiting.  Reported of periumbilical abdominal pain.  Extensive work-up including MRCP and HIDA scan was -32-month ago.  Upon admission had transaminitis, elevated anion gap, thrombocytopenia.  CT of the abdomen pelvis, right upper quadrant ultrasound, MRCP-unremarkable.    Cardiology consulted for clearance as patient is on Plavix for recent stent 6 months ago.  Patient underwent laparoscopic cholecystectomy with IOC on 8/17.  He tolerated the procedure well.  Did have postop pain at the surgical site which was treated with pain medications.  His lipase levels after surgery were normal.  Abdominal x-ray showed vascular phleboliths as well as bladder stone.  Flomax was prescribed.  He was discharged in stable condition.   Discharge Diagnoses:  Principal Problem:   Intractable vomiting with nausea Active Problems:   Essential hypertension   GERD   Thrombocytopenia (HCC)   Hypercholesteremia   Elevated LFTs   Elevated lipase   Metabolic acidosis   Acute pancreatitis   Hypokalemia    Acute gallstone pancreatitis, improved Status post laparoscopic cholecystectomy with  IOC-8/17 -Abdominal pain still persists.  Describes it as slightly different in nature.  Possible surgical site.  Lipase is relatively stable. - MRCP 8/11-negative.  HIDA 8/13-negative. -  Resume Plavix today.  Pain medications prescribed by surgery.  Anion gap metabolic acidosis -  Resolved  Severe hepatic steatosis - Drinks 1 drink daily.  On Zetia.  Thrombocytopenia -Stable No obvious signs of bleeding.  Acute kidney injury on CKD stage II - Baseline creatinine 1.0.  Improved.  Resume home meds  Coronary artery disease status post drug-eluting stent 6 months ago -Continue home medications.    Resume aspirin and Plavix  History of depression -Continue Prozac  Left arm swelling concerning for thrombophlebitis/superficial - Resolved  Consultations:  General surgery  Subjective: Still has some lower abdominal pain at the surgical site otherwise no other complaints.  Tolerating oral diet.  He is able to pass gas and have bowel movement.  Discharge Exam: Vitals:   12/10/18 2126 12/11/18 0617  BP: 133/87 (!) 149/92  Pulse: 66 71  Resp: 18 16  Temp: 98.1 F (36.7 C) 99.6 F (37.6 C)  SpO2: 96% 96%   Vitals:   12/09/18 2133 12/10/18 0603 12/10/18 2126 12/11/18 0617  BP: 120/88 (!) 144/92 133/87 (!) 149/92  Pulse: 72 67 66 71  Resp: 16 16 18 16   Temp: 98.2 F (36.8 C) 97.7 F (36.5 C) 98.1 F (36.7 C) 99.6 F (37.6 C)  TempSrc: Oral Oral Oral Oral  SpO2: 91% 97% 96% 96%  Weight:      Height:        General: Pt is alert, awake, not in acute distress Cardiovascular: RRR, S1/S2 +, no rubs, no gallops Respiratory: CTA bilaterally, no wheezing, no rhonchi Abdominal: Soft,  NT, ND, bowel sounds +.  Surgical site/laparoscopic scars noted on the abdomen-slightly tender to touch. Extremities: no edema, no cyanosis  Discharge Instructions   Allergies as of 12/11/2018      Reactions   Tramadol Palpitations      Medication List    TAKE these medications    amLODipine 10 MG tablet Commonly known as: NORVASC TAKE ONE TABLET BY MOUTH DAILY   aspirin EC 81 MG tablet Take 81 mg by mouth daily.   calcium carbonate 750 MG chewable tablet Commonly known as: TUMS EX Chew 2 tablets by mouth as needed for heartburn.   clopidogrel 75 MG tablet Commonly known as: PLAVIX Take 75 mg by mouth daily.   Evolocumab 140 MG/ML Soaj Commonly known as: Repatha SureClick Inject 993 mg into the skin every 14 (fourteen) days.   ezetimibe 10 MG tablet Commonly known as: ZETIA Take 1 tablet (10 mg total) by mouth daily.   FLUoxetine 20 MG tablet Commonly known as: PROZAC Take 20 mg by mouth daily.   fluticasone 50 MCG/ACT nasal spray Commonly known as: FLONASE Place 2 sprays into both nostrils daily as needed for allergies or rhinitis.   isosorbide mononitrate 30 MG 24 hr tablet Commonly known as: IMDUR Take 1 tablet (30 mg total) by mouth daily.   lansoprazole 30 MG capsule Commonly known as: PREVACID TAKE ONE CAPSULE BY MOUTH TWICE A DAY BEFORE A MEAL What changed: See the new instructions.   levocetirizine 5 MG tablet Commonly known as: XYZAL Take 1 tablet (5 mg total) by mouth every evening.   losartan 100 MG tablet Commonly known as: COZAAR Take 100 mg by mouth daily.   methocarbamol 500 MG tablet Commonly known as: ROBAXIN Take 1 tablet (500 mg total) by mouth every 6 (six) hours as needed for muscle spasms.   metoprolol succinate 50 MG 24 hr tablet Commonly known as: TOPROL-XL Take 1 tablet (50 mg total) by mouth daily. Take with or immediately following a meal.   nitroGLYCERIN 0.4 MG SL tablet Commonly known as: NITROSTAT Place 1 tablet (0.4 mg total) under the tongue every 5 (five) minutes as needed for chest pain.   ondansetron 4 MG tablet Commonly known as: ZOFRAN Take 1 tablet (4 mg total) by mouth every 6 (six) hours as needed for nausea.   oxyCODONE 5 MG immediate release tablet Commonly known as: Oxy  IR/ROXICODONE Take 1-2 tablets (5-10 mg total) by mouth every 6 (six) hours as needed for severe pain.   polyethylene glycol 17 g packet Commonly known as: MIRALAX / GLYCOLAX Take 17 g by mouth daily as needed for moderate constipation or severe constipation.   promethazine 12.5 MG tablet Commonly known as: PHENERGAN Take 1 tablet (12.5 mg total) by mouth every 6 (six) hours as needed for nausea or vomiting. What changed: reasons to take this   senna-docusate 8.6-50 MG tablet Commonly known as: Senokot-S Take 2 tablets by mouth at bedtime as needed for mild constipation or moderate constipation.   SYSTANE OP Place 1 drop into both eyes every 3 (three) hours as needed (dry eyes).   tamsulosin 0.4 MG Caps capsule Commonly known as: Flomax Take 1 capsule (0.4 mg total) by mouth daily after supper.   traZODone 50 MG tablet Commonly known as: DESYREL Take 25-50 mg by mouth at bedtime as needed for sleep.      Follow-up [facility], [place]. Go on 12/24/2018.   Specialty: General Surgery Why: Your appointment  is 09/01 at 11:30am Please arrive 30 minutes prior to your appointment to check in and fill out paperwork. Bring photo ID and insurance information. Contact information: [place] [phone]         Allergies  Allergen Reactions  . Tramadol Palpitations    You were cared for by a hospitalist during your hospital stay. If you have any questions about your discharge medications or the care you received while you were in the hospital after you are discharged, you can call the unit and asked to speak with the hospitalist on call if the hospitalist that took care of you is not available. Once you are discharged, your primary care physician will handle any further medical issues. Please note that no refills for any discharge medications will be authorized once you are discharged, as it is  imperative that you return to your primary care physician (or establish a relationship with a primary care physician if you do not have one) for your aftercare needs so that they can reassess your need for medications and monitor your lab values.   Procedures/Studies: Dg Chest 2 View  Result Date: 12/02/2018 CLINICAL DATA:  Shortness of breath EXAM: CHEST - 2 VIEW COMPARISON:  CTA chest dated 10/29/2018. Partial comparison to CT abdomen/pelvis dated 12/02/2018 at 0111 hours FINDINGS: Mild left lower lobe opacity, new from recent CT abdomen/pelvis, favoring atelectasis. Right lung is clear, noting eventration right hemidiaphragm. No pleural effusion or pneumothorax. Cardiomegaly. Visualized osseous structures are within normal limits. IMPRESSION: Mild left lower lobe opacity, new from recent CT abdomen/pelvis earlier today, favoring atelectasis. Electronically Signed   By: Julian Hy M.D.   On: 12/02/2018 23:15   Dg Abd 1 View  Result Date: 12/11/2018 CLINICAL DATA:  Post op abd distention EXAM: ABDOMEN - 1 VIEW COMPARISON:  CT abdomen pelvis 12/02/2018 FINDINGS: There are nondistended air-filled loops of bowel throughout the abdomen in a nonobstructive pattern. No evidence for intra-abdominal free air on supine radiograph. Several surgical clips are seen in the right upper quadrant. Lung bases are largely excluded from field of view. No acute osseous abnormality in the visualized bones. Multiple radiopaque densities in the pelvis likely represent vascular phleboliths as well as a bladder stone seen on the recent prior CT. IMPRESSION: Nonobstructive bowel gas pattern. Electronically Signed   By: Audie Pinto M.D.   On: 12/11/2018 08:29   Dg Cholangiogram Operative  Result Date: 12/09/2018 CLINICAL DATA:  58 year old male with a history of cholelithiasis EXAM: INTRAOPERATIVE CHOLANGIOGRAM TECHNIQUE: Cholangiographic images from the C-arm fluoroscopic device were submitted for interpretation  post-operatively. Please see the procedural report for the amount of contrast and the fluoroscopy time utilized. COMPARISON:  MR December 03, 2018, nuclear medicine study December 05, 2018 FINDINGS: Surgical instruments project over the upper abdomen. There is cannulation of the cystic duct/gallbladder neck, with antegrade infusion of contrast. Caliber of the extrahepatic ductal system within normal limits. No definite filling defect within the extrahepatic ducts identified. Free flow of contrast across the ampulla. IMPRESSION: Intraoperative cholangiogram demonstrates extrahepatic biliary ducts of unremarkable caliber, with no definite filling defects identified. Free flow of contrast across the ampulla. Please refer to the dictated operative report for full details of intraoperative findings and procedure Electronically Signed   By: Corrie Mckusick D.O.   On: 12/09/2018 12:10   Nm Hepatobiliary Liver Func  Result Date: 12/05/2018 CLINICAL DATA:  Abdominal pain EXAM: NUCLEAR MEDICINE HEPATOBILIARY IMAGING TECHNIQUE: Sequential images of the  abdomen were obtained out to 60 minutes following intravenous administration of radiopharmaceutical. RADIOPHARMACEUTICALS:  5.2 mCi Tc-37m  Choletec IV COMPARISON:  None. FINDINGS: Prompt uptake and biliary excretion of activity by the liver is seen. Gallbladder activity is visualized, consistent with patency of cystic duct. Biliary activity passes into small bowel, consistent with patent common bile duct. IMPRESSION: Normal exam. Electronically Signed   By: Prudencio Pair M.D.   On: 12/05/2018 16:48   Ct Abdomen Pelvis W Contrast  Result Date: 12/02/2018 CLINICAL DATA:  Pain. Acute. Generalized. Personal history tubular adenoma the colon. EXAM: CT ABDOMEN AND PELVIS WITH CONTRAST TECHNIQUE: Multidetector CT imaging of the abdomen and pelvis was performed using the standard protocol following bolus administration of intravenous contrast. CONTRAST:  170mL OMNIPAQUE IOHEXOL 300  MG/ML  SOLN COMPARISON:  CT of the abdomen and pelvis 10/29/2018 FINDINGS: Lower chest: The lung bases are clear without focal nodule, mass, or airspace disease. Heart size is normal. Coronary artery calcifications are again noted. No significant pleural or pericardial effusion is present. Hepatobiliary: There is diffuse fatty infiltration of the liver. Homogeneous hyperdense material is present in the gallbladder. Common bile duct is normal. No focal hepatic lesions are present. Pancreas: Unremarkable. No pancreatic ductal dilatation or surrounding inflammatory changes. Spleen: Normal in size without focal abnormality. Adrenals/Urinary Tract: Adrenal glands are normal bilaterally. Exophytic cyst of the left kidney are again noted. There is no stone or mass lesion. Ureters are within normal limits. Posterior bladder stone the left is stable. Is fat in the bladder wall anteriorly, stable. No other discrete lesions are present. Stomach/Bowel: Stomach and duodenum are within normal limits. Small bowel is unremarkable. Terminal ileum is within normal limits. Appendectomy is noted. Ascending and proximal transverse colon are mostly collapsed. Transverse and descending colon are within normal limits. Sigmoid colon is unremarkable. Vascular/Lymphatic: Atherosclerotic calcifications are present in the aorta without aneurysm. No significant adenopathy is present. Reproductive: Prostate is mildly enlarged. No discrete lesions are present. Other: No abdominal wall hernia or abnormality. No abdominopelvic ascites. Musculoskeletal: Mild degenerative changes are noted in the lower lumbar spine. Degenerative changes are noted at the SI joints. Hips are within normal limits. IMPRESSION: 1. No acute or focal lesion to explain the patient's abdominal pain. 2. Extensive hepatic steatosis. 3.  Aortic Atherosclerosis (ICD10-I70.0). 4. Coronary artery disease. 5. Degenerative changes in the lower lumbar spine. Electronically Signed   By:  San Morelle M.D.   On: 12/02/2018 13:27   Mr 3d Recon At Scanner  Result Date: 12/03/2018 CLINICAL DATA:  Abnormal LFTs EXAM: MRI ABDOMEN WITHOUT AND WITH CONTRAST (INCLUDING MRCP) TECHNIQUE: Multiplanar multisequence MR imaging of the abdomen was performed both before and after the administration of intravenous contrast. Heavily T2-weighted images of the biliary and pancreatic ducts were obtained, and three-dimensional MRCP images were rendered by post processing. CONTRAST:  10 mL Gadovist IV COMPARISON:  CT abdomen/pelvis dated 12/02/2018. MRI abdomen dated 10/30/2018. FINDINGS: Motion degraded images. Lower chest: Lung bases are clear. Hepatobiliary: Severe hepatic steatosis. No focal hepatic lesions, noting that the dynamic postcontrast imaging is severely motion degraded. Gallbladder is unremarkable. No intrahepatic or extrahepatic ductal dilatation. Pancreas:  Within normal limits. Spleen:  Within normal limits. Adrenals/Urinary Tract:  Adrenal glands are within normal limits. Two left renal cysts measuring up to 2.8 cm, simple. Right kidney is within normal limits. No hydronephrosis. Stomach/Bowel: Stomach is within normal limits. Visualized bowel is unremarkable. Vascular/Lymphatic:  No evidence of abdominal aortic aneurysm. No suspicious abdominal lymphadenopathy. Other:  No  abdominal ascites. Musculoskeletal: No focal osseous lesions. IMPRESSION: Motion degraded images. Severe hepatic steatosis. Otherwise unremarkable MRI abdomen. Electronically Signed   By: Julian Hy M.D.   On: 12/03/2018 20:54   Mr Card Morphology Wo/w Cm  Result Date: 11/15/2018 CLINICAL DATA:  58 year old male with h/o CAD status post STEMI treated with DES/OM1 in 11/2016 with residual 80% stenosis in the distal portion of OM1, s/p DES to the mid RCA in 05/2018. EXAM: CARDIAC MRI TECHNIQUE: The patient was scanned on a 1.5 Tesla GE magnet. A dedicated cardiac coil was used. Functional imaging was done using  Fiesta sequences. 2,3, and 4 chamber views were done to assess for RWMA's. Modified Simpson's rule using a short axis stack was used to calculate an ejection fraction on a dedicated work Conservation officer, nature. The patient received 10 cc of Gadavist. After 10 minutes inversion recovery sequences were used to assess for infiltration and scar tissue. The patient was given 1 mg of iv Ativan for claustrophobia. CONTRAST:  10 cc  of Gadavist FINDINGS: 1. Mildly dilated let ventricle with normal wall thickness and systolic function (LVEF = 62%). There is mild hypokinesis in the mid inferolateral wall. There is 25-50% endocardial late gadolinium enhancement in the basal and mid inferolateral walls. LVEDD: 58 mm LVESD: 44 mm LVEDV: 203 ml LVESV: 77 ml SV: 126 ml CO: 10.3 L/min Myocardial mass: 133 g 2. Normal right ventricular size, thickness and systolic function (LVEF = 52%). There are no regional wall motion abnormalities. 3.  Mildly dilated left atrium, normal size of the right atrium. 4. Normal size of the aortic root, ascending aorta. Mildly dilated pulmonary artery measuring 34 mm. 5.  Mild mitral and trivial tricuspid regurgitation. 6.  Normal pericardium.  No pericardial effusion. IMPRESSION: 1. Mildly dilated let ventricle with normal wall thickness and systolic function (LVEF =35%). There is mild hypokinesis in the mid inferolateral wall. There is 25-50% endocardial late gadolinium enhancement in the basal and mid inferolateral walls (good prognosis of recovery if revascularized). 2. Normal right ventricular size, thickness and systolic function (LVEF = 52%). There are no regional wall motion abnormalities. 3.  Mildly dilated left atrium, normal size of the right atrium. 4. Normal size of the aortic root, ascending aorta. Mildly dilated pulmonary artery measuring 34 mm. 5.  Mild mitral and trivial tricuspid regurgitation. 6. Normal pericardium. No pericardial effusion. No evidence for pericarditis.  Electronically Signed   By: Ena Dawley   On: 11/15/2018 12:43   Mr Abdomen Mrcp W Wo Contast  Result Date: 12/03/2018 CLINICAL DATA:  Abnormal LFTs EXAM: MRI ABDOMEN WITHOUT AND WITH CONTRAST (INCLUDING MRCP) TECHNIQUE: Multiplanar multisequence MR imaging of the abdomen was performed both before and after the administration of intravenous contrast. Heavily T2-weighted images of the biliary and pancreatic ducts were obtained, and three-dimensional MRCP images were rendered by post processing. CONTRAST:  10 mL Gadovist IV COMPARISON:  CT abdomen/pelvis dated 12/02/2018. MRI abdomen dated 10/30/2018. FINDINGS: Motion degraded images. Lower chest: Lung bases are clear. Hepatobiliary: Severe hepatic steatosis. No focal hepatic lesions, noting that the dynamic postcontrast imaging is severely motion degraded. Gallbladder is unremarkable. No intrahepatic or extrahepatic ductal dilatation. Pancreas:  Within normal limits. Spleen:  Within normal limits. Adrenals/Urinary Tract:  Adrenal glands are within normal limits. Two left renal cysts measuring up to 2.8 cm, simple. Right kidney is within normal limits. No hydronephrosis. Stomach/Bowel: Stomach is within normal limits. Visualized bowel is unremarkable. Vascular/Lymphatic:  No evidence of abdominal aortic aneurysm. No  suspicious abdominal lymphadenopathy. Other:  No abdominal ascites. Musculoskeletal: No focal osseous lesions. IMPRESSION: Motion degraded images. Severe hepatic steatosis. Otherwise unremarkable MRI abdomen. Electronically Signed   By: Julian Hy M.D.   On: 12/03/2018 20:54   US Abdomen Limited Ruq  Result Date: 12/02/2018 CLINICAL DATA:  58 year old male with elevated bilirubin. EXAM: ULTRASOUND ABDOMEN LIMITED RIGHT UPPER QUADRANT COMPARISON:  CT of the abdomen pelvis dated 12/02/2018 FINDINGS: Gallbladder: There is sludge in the gallbladder. No gallbladder wall thickening or pericholecystic fluid. Negative sonographic Murphy's sign.  Common bile duct: Diameter: 8 mm Liver: The diffuse increased liver echogenicity most consistent with fatty infiltration. Superimposed fibrosis or inflammation is not excluded. Portal vein is patent on color Doppler imaging with normal direction of blood flow towards the liver. Other: None. IMPRESSION: 1. Gallbladder sludge. No sonographic evidence of acute cholecystitis. 2. Fatty liver. Electronically Signed   By: Anner Crete M.D.   On: 12/02/2018 23:31      The results of significant diagnostics from this hospitalization (including imaging, microbiology, ancillary and laboratory) are listed below for reference.     Microbiology: Recent Results (from the past 240 hour(s))  SARS Coronavirus 2 [facility] order, Performed in [facility] hospital lab) Nasopharyngeal Nasopharyngeal Swab     Status: None   Collection Time: 12/02/18  3:14 PM   Specimen: Nasopharyngeal Swab  Result Value Ref Range Status   SARS Coronavirus 2 NEGATIVE NEGATIVE Final    Comment: (NOTE) If result is NEGATIVE SARS-CoV-2 target nucleic acids are NOT DETECTED. The SARS-CoV-2 RNA is generally detectable in upper and lower  respiratory specimens during the acute phase of infection. The lowest  concentration of SARS-CoV-2 viral copies this assay can detect is 250  copies / mL. A negative result does not preclude SARS-CoV-2 infection  and should not be used as the sole basis for treatment or other  patient management decisions.  A negative result may occur with  improper specimen collection / handling, submission of specimen other  than nasopharyngeal swab, presence of viral mutation(s) within the  areas targeted by this assay, and inadequate number of viral copies  (<250 copies / mL). A negative result must be combined with clinical  observations, patient history, and epidemiological information. If result is POSITIVE SARS-CoV-2 target nucleic acids are DETECTED. The SARS-CoV-2 RNA is generally detectable in upper  and lower  respiratory specimens dur ing the acute phase of infection.  Positive  results are indicative of active infection with SARS-CoV-2.  Clinical  correlation with patient history and other diagnostic information is  necessary to determine patient infection status.  Positive results do  not rule out bacterial infection or co-infection with other viruses. If result is PRESUMPTIVE POSTIVE SARS-CoV-2 nucleic acids MAY BE PRESENT.   A presumptive positive result was obtained on the submitted specimen  and confirmed on repeat testing.  While 2019 novel coronavirus  (SARS-CoV-2) nucleic acids may be present in the submitted sample  additional confirmatory testing may be necessary for epidemiological  and / or clinical management purposes  to differentiate between  SARS-CoV-2 and other Sarbecovirus currently known to infect humans.  If clinically indicated additional testing with an alternate test  methodology [id number]) is advised. The SARS-CoV-2 RNA is generally  detectable in upper and lower respiratory sp ecimens during the acute  phase of infection. The expected result is Negative. Fact Sheet for Patients:  StrictlyIdeas.no Fact Sheet for Healthcare Providers: BankingDealers.co.za This test is not yet approved or cleared by the [place]  FDA and has been authorized for detection and/or diagnosis of SARS-CoV-2 by FDA under an Emergency Use Authorization (EUA).  This EUA will remain in effect (meaning this test can be used) for the duration of the COVID-19 declaration under Section 564(b)(1) of the Act, 21 U.S.C. section 360bbb-3(b)(1), unless the authorization is terminated or revoked sooner. Performed at [facility], [address], [place], [place] [address]   Surgical PCR screen     Status: Abnormal   Collection Time: 12/09/18  1:55 AM   Specimen: Nasal Mucosa; Nasal Swab  Result Value Ref Range Status   MRSA,  PCR NEGATIVE NEGATIVE Final   Staphylococcus aureus POSITIVE (A) NEGATIVE Final    Comment: (NOTE) The Xpert SA Assay (FDA approved for NASAL specimens in patients 85 years of age and older), is one component of a comprehensive surveillance program. It is not intended to diagnose infection nor to guide or monitor treatment. Performed at [facility], [place] [address], [place], [place] [address]      Labs: BNP (last 3 results) Recent Labs    10/29/18 1003 12/02/18 1135  BNP 37.0 82.4   Basic Metabolic Panel: Recent Labs  Lab 12/07/18 0307 12/08/18 0238 12/09/18 0335 12/10/18 0216 12/11/18 0343  NA 138 137 140 137 138  K 2.9* 3.0* 3.2* 3.6 3.3*  CL 101 98 102 102 103  CO2 26 26 26 25 26   GLUCOSE 129* 128* 108* 123* 123*  BUN <5* <5* <5* 6 5*  CREATININE 0.67 0.73 0.65 0.79 0.84  CALCIUM 8.9 8.7* 8.5* 8.3* 8.4*  MG  --  1.3* 1.9 1.6* 1.5*   Liver Function Tests: Recent Labs  Lab 12/07/18 0307 12/08/18 0238 12/09/18 0335 12/10/18 0216 12/11/18 0343  AST 65* 59* 61* 157* 108*  ALT 38 36 37 62* 60*  ALKPHOS [phone]  BILITOT 1.3* 1.1 1.2 0.8 0.8  PROT 6.6 6.5 6.3* 6.1* 5.8*  ALBUMIN 2.9* 2.9* 2.9* 2.7* 2.6*   Recent Labs  Lab 12/05/18 0519 12/06/18 0248 12/08/18 0238 12/09/18 0335 12/10/18 0216  LIPASE 278* 215* 188* 152* 102*   No results for input(s): AMMONIA in the last 168 hours. CBC: Recent Labs  Lab 12/07/18 0307 12/08/18 0238 12/09/18 0335 12/10/18 0216 12/11/18 0343  WBC 7.0 6.1 6.0 8.1 6.9  HGB 10.1* 10.1* 10.3* 9.3* 9.1*  HCT 28.6* 29.7* 30.0* 27.4* 26.5*  MCV 96.3 99.3 99.3 98.2 98.1  PLT 133* 129* 165 144* 150   Cardiac Enzymes: No results for input(s): CKTOTAL, CKMB, CKMBINDEX, TROPONINI in the last 168 hours. BNP: Invalid input(s): POCBNP CBG: No results for input(s): GLUCAP in the last 168 hours. D-Dimer No results for input(s): DDIMER in the last 72 hours. Hgb A1c No results for input(s): HGBA1C in the last 72  hours. Lipid Profile No results for input(s): CHOL, HDL, LDLCALC, TRIG, CHOLHDL, LDLDIRECT in the last 72 hours. Thyroid function studies No results for input(s): TSH, T4TOTAL, T3FREE, THYROIDAB in the last 72 hours.  Invalid input(s): FREET3 Anemia work up No results for input(s): VITAMINB12, FOLATE, FERRITIN, TIBC, IRON, RETICCTPCT in the last 72 hours. Urinalysis    Component Value Date/Time   COLORURINE AMBER (A) 12/03/2018 0541   APPEARANCEUR HAZY (A) 12/03/2018 0541   LABSPEC 1.039 (H) 12/03/2018 0541   PHURINE 6.0 12/03/2018 0541   GLUCOSEU NEGATIVE 12/03/2018 0541   GLUCOSEU NEGATIVE 05/27/2018 1421   HGBUR SMALL (A) 12/03/2018 0541   BILIRUBINUR NEGATIVE 12/03/2018 0541  BILIRUBINUR negative 02/23/2017 1539   KETONESUR 20 (A) 12/03/2018 0541   PROTEINUR 100 (A) 12/03/2018 0541   UROBILINOGEN 0.2 05/27/2018 1421   NITRITE NEGATIVE 12/03/2018 0541   LEUKOCYTESUR NEGATIVE 12/03/2018 0541   Sepsis Labs Invalid input(s): PROCALCITONIN,  WBC,  LACTICIDVEN Microbiology Recent Results (from the past 240 hour(s))  SARS Coronavirus 2 [facility] order, Performed in [facility] hospital lab) Nasopharyngeal Nasopharyngeal Swab     Status: None   Collection Time: 12/02/18  3:14 PM   Specimen: Nasopharyngeal Swab  Result Value Ref Range Status   SARS Coronavirus 2 NEGATIVE NEGATIVE Final    Comment: (NOTE) If result is NEGATIVE SARS-CoV-2 target nucleic acids are NOT DETECTED. The SARS-CoV-2 RNA is generally detectable in upper and lower  respiratory specimens during the acute phase of infection. The lowest  concentration of SARS-CoV-2 viral copies this assay can detect is 250  copies / mL. A negative result does not preclude SARS-CoV-2 infection  and should not be used as the sole basis for treatment or other  patient management decisions.  A negative result may occur with  improper specimen collection / handling, submission of specimen other  than nasopharyngeal swab,  presence of viral mutation(s) within the  areas targeted by this assay, and inadequate number of viral copies  (<250 copies / mL). A negative result must be combined with clinical  observations, patient history, and epidemiological information. If result is POSITIVE SARS-CoV-2 target nucleic acids are DETECTED. The SARS-CoV-2 RNA is generally detectable in upper and lower  respiratory specimens dur ing the acute phase of infection.  Positive  results are indicative of active infection with SARS-CoV-2.  Clinical  correlation with patient history and other diagnostic information is  necessary to determine patient infection status.  Positive results do  not rule out bacterial infection or co-infection with other viruses. If result is PRESUMPTIVE POSTIVE SARS-CoV-2 nucleic acids MAY BE PRESENT.   A presumptive positive result was obtained on the submitted specimen  and confirmed on repeat testing.  While 2019 novel coronavirus  (SARS-CoV-2) nucleic acids may be present in the submitted sample  additional confirmatory testing may be necessary for epidemiological  and / or clinical management purposes  to differentiate between  SARS-CoV-2 and other Sarbecovirus currently known to infect humans.  If clinically indicated additional testing with an alternate test  methodology [phone]) is advised. The SARS-CoV-2 RNA is generally  detectable in upper and lower respiratory sp ecimens during the acute  phase of infection. The expected result is Negative. Fact Sheet for Patients:  StrictlyIdeas.no Fact Sheet for Healthcare Providers: BankingDealers.co.za This test is not yet approved or cleared by the [place] FDA and has been authorized for detection and/or diagnosis of SARS-CoV-2 by FDA under an Emergency Use Authorization (EUA).  This EUA will remain in effect (meaning this test can be used) for the duration of the COVID-19 declaration under  Section 564(b)(1) of the Act, 21 U.S.C. section 360bbb-3(b)(1), unless the authorization is terminated or revoked sooner. Performed at [facility], [address] [place], [place] [address]   Surgical PCR screen     Status: Abnormal   Collection Time: 12/09/18  1:55 AM   Specimen: Nasal Mucosa; Nasal Swab  Result Value Ref Range Status   MRSA, PCR NEGATIVE NEGATIVE Final   Staphylococcus aureus POSITIVE (A) NEGATIVE Final    Comment: (NOTE) The Xpert SA Assay (FDA approved for NASAL specimens in patients 46 years of age and older), is one  component of a comprehensive surveillance program. It is not intended to diagnose infection nor to guide or monitor treatment. Performed at [facility], [place] [address], [place], [place] [address]      Time coordinating discharge:  I have spent 35 minutes face to face with the patient and on the ward discussing the patients care, assessment, plan and disposition with other care givers. >50% of the time was devoted counseling the patient about the risks and benefits of treatment/Discharge disposition and coordinating care.   SIGNED:   Damita Lack, MD  [facility] 12/11/2018, 10:21 AM   If 7PM-7AM, please contact night-coverage [email or web address]

## 2018-12-18 ENCOUNTER — Ambulatory Visit: Payer: 59 | Admitting: Family Medicine

## 2018-12-18 ENCOUNTER — Encounter: Payer: Self-pay | Admitting: Family Medicine

## 2018-12-18 ENCOUNTER — Encounter: Payer: Self-pay | Admitting: Neurology

## 2018-12-18 ENCOUNTER — Other Ambulatory Visit: Payer: Self-pay

## 2018-12-18 VITALS — BP 108/64 | HR 104 | Temp 97.4°F | Ht 70.5 in | Wt 217.0 lb

## 2018-12-18 DIAGNOSIS — K851 Biliary acute pancreatitis without necrosis or infection: Secondary | ICD-10-CM | POA: Diagnosis not present

## 2018-12-18 DIAGNOSIS — B353 Tinea pedis: Secondary | ICD-10-CM

## 2018-12-18 DIAGNOSIS — I809 Phlebitis and thrombophlebitis of unspecified site: Secondary | ICD-10-CM | POA: Diagnosis not present

## 2018-12-18 DIAGNOSIS — G629 Polyneuropathy, unspecified: Secondary | ICD-10-CM

## 2018-12-18 LAB — CBC
HCT: 32.9 % — ABNORMAL LOW (ref 39.0–52.0)
Hemoglobin: 11 g/dL — ABNORMAL LOW (ref 13.0–17.0)
MCHC: 33.5 g/dL (ref 30.0–36.0)
MCV: 101.5 fl — ABNORMAL HIGH (ref 78.0–100.0)
Platelets: 263 10*3/uL (ref 150.0–400.0)
RBC: 3.24 Mil/uL — ABNORMAL LOW (ref 4.22–5.81)
RDW: 16.7 % — ABNORMAL HIGH (ref 11.5–15.5)
WBC: 7.1 10*3/uL (ref 4.0–10.5)

## 2018-12-18 LAB — TSH: TSH: 0.76 u[IU]/mL (ref 0.35–4.50)

## 2018-12-18 LAB — COMPREHENSIVE METABOLIC PANEL
ALT: 56 U/L — ABNORMAL HIGH (ref 0–53)
AST: 58 U/L — ABNORMAL HIGH (ref 0–37)
Albumin: 3.7 g/dL (ref 3.5–5.2)
Alkaline Phosphatase: 79 U/L (ref 39–117)
BUN: 7 mg/dL (ref 6–23)
CO2: 18 mEq/L — ABNORMAL LOW (ref 19–32)
Calcium: 8.8 mg/dL (ref 8.4–10.5)
Chloride: 104 mEq/L (ref 96–112)
Creatinine, Ser: 0.77 mg/dL (ref 0.40–1.50)
GFR: 126.58 mL/min (ref >60.00)
Glucose, Bld: 120 mg/dL — ABNORMAL HIGH (ref 70–99)
Potassium: 3.6 mEq/L (ref 3.5–5.1)
Sodium: 138 mEq/L (ref 135–145)
Total Bilirubin: 0.6 mg/dL (ref 0.2–1.2)
Total Protein: 7 g/dL (ref 6.0–8.3)

## 2018-12-18 LAB — T4, FREE: Free T4: 1.2 ng/dL (ref 0.60–1.60)

## 2018-12-18 LAB — VITAMIN B12: Vitamin B-12: 1030 pg/mL — ABNORMAL HIGH (ref 211–911)

## 2018-12-18 MED ORDER — KETOCONAZOLE 2 % EX CREA: TOPICAL_CREAM | CUTANEOUS | 0 refills | Status: DC

## 2018-12-18 MED ORDER — GABAPENTIN 300 MG PO CAPS: 300.0000 mg | ORAL_CAPSULE | Freq: Three times a day (TID) | ORAL | 3 refills | Status: DC

## 2018-12-18 MED ORDER — VENLAFAXINE HCL ER 75 MG PO TB24: 75.0000 mg | ORAL_TABLET | Freq: Every day | ORAL | 2 refills | Status: DC

## 2018-12-18 NOTE — Patient Instructions (Signed)
Give us 2-3 business days to get the results of your labs back.   Keep the diet clean and stay active.  If you do not hear anything about your referral in the next 1-2 weeks, call our office and ask for an update.  Let us know if you need anything. 

## 2018-12-18 NOTE — Progress Notes (Signed)
Chief Complaint  Patient presents with  . Hospitalization Follow-up    Subjective: Patient is a 58 y.o. male here for hosp f/u.  Patient was admitted at [facility] from 12/02/2018- 12/11/18 for acute gallstone pancreatitis.  He did have his gallbladder taken out.  He feels relatively well after that.  He is having some postoperative pain, mainly around his umbilicus.  He denies any fevers, abdominal pain, nausea, vomiting, diarrhea, chest pain, or shortness of breath.  His main concern today is numbness/tingling/pain in his feet.  He had a similar issue in both of his hands prior to discharge.  His hands peeled and subsequently improved.  His feet have actually gotten worse.  There is no recent injury or change in activity.  He associates the SCDs with this. No hx of DM. He was on a liquid diet for 10 d while in surg and had lost around 30 lbs.   Has an area on his arm that is dark, thick and tender. Is near area where they gave him his IV access. No redness or drainage.   ROS: Heart: Denies chest pain  Lungs: Denies SOB GI: No nausea Const: no fevers GU: No blood in urine Neuro: As noted in HPI MSK: No leg pain Eyes: no vision changes Skin: +peeling on hands Endo: +wt loss   Past Medical History:  Diagnosis Date  . BPH (benign prostatic hyperplasia)   . CAD (coronary artery disease)    a. NSTEMI troponin >65 with cath 11/2016 S/p PTCA & DES to mid circumflex. b. 05/2018 s/p DES to [facility].  Marland Kitchen Chronic chest pain   . Chronic combined systolic and diastolic CHF (congestive heart failure) ([place])    a. EF 45% in 2016/08/02. b. EF 55-60% by echo 01/2017. c. EF 48% by nuc 07/2017.  . CKD (chronic kidney disease), stage II   . Cluster headache    hx of  . Complication of anesthesia    difficult waking up"  . Diverticulosis   . Drug abuse ([place])    hx of  . GERD (gastroesophageal reflux disease)   . Hiatal hernia   . Hyperlipidemia   . Hypertension   . Hypothyroidism   . Nontoxic  multinodular goiter   . NSTEMI (non-ST elevated myocardial infarction) ([place])   . Perforated nasal septum   . Pulmonary nodule --- CT 10/19/2014: Nodule is stable, no further routine x-rays 01/06/2009  . Thrombocytopenia ([place])   . TIA (transient ischemic attack)   . Trigeminal neuralgia   . Tubular adenoma of colon 12/2015  . Unspecified asthma(493.90)    Family History  Problem Relation Age of Onset  . Diabetes Mother   . Hyperlipidemia Mother   . Hypertension Mother   . Hypertension Father   . Heart disease Father   . Cancer Father   . Sudden death Father        OCT 2009-08-02  . CAD Father   . Heart failure Father   . Mesothelioma Father   . Heart disease Maternal Aunt   . Prostate cancer Neg Hx   . Colon cancer Neg Hx    Allergies as of 12/18/2018      Reactions   Tramadol Palpitations      Medication List       Accurate as of December 18, 2018  1:59 PM. If you have any questions, ask your nurse or doctor.        STOP taking these medications   FLUoxetine 20 MG tablet  Commonly known as: PROZAC Stopped by: [name], DO   methocarbamol 500 MG tablet Commonly known as: ROBAXIN Stopped by: [name], DO   oxyCODONE 5 MG immediate release tablet Commonly known as: Oxy IR/ROXICODONE Stopped by: [name], DO   polyethylene glycol 17 g packet Commonly known as: MIRALAX / GLYCOLAX Stopped by: [name], DO   senna-docusate 8.6-50 MG tablet Commonly known as: Senokot-S Stopped by: [name], DO   tamsulosin 0.4 MG Caps capsule Commonly known as: Flomax Stopped by: [name], DO     TAKE these medications   amLODipine 10 MG tablet Commonly known as: NORVASC TAKE ONE TABLET BY MOUTH DAILY   aspirin EC 81 MG tablet Take 81 mg by mouth daily.   calcium carbonate 750 MG chewable tablet Commonly known as: TUMS EX Chew 2 tablets by mouth as needed for heartburn.   clopidogrel 75 MG  tablet Commonly known as: PLAVIX Take 75 mg by mouth daily.   Evolocumab 140 MG/ML Soaj Commonly known as: Repatha SureClick Inject [id number] mg into the skin every 14 (fourteen) days.   ezetimibe 10 MG tablet Commonly known as: ZETIA Take 1 tablet (10 mg total) by mouth daily.   fluticasone 50 MCG/ACT nasal spray Commonly known as: FLONASE Place 2 sprays into both nostrils daily as needed for allergies or rhinitis.   isosorbide mononitrate 30 MG 24 hr tablet Commonly known as: IMDUR Take 1 tablet (30 mg total) by mouth daily.   ketoconazole 2 % cream Commonly known as: NIZORAL 1 application between toes and on affected areas on feet daily for 6 weeks. Started by: [name], DO   lansoprazole 30 MG capsule Commonly known as: PREVACID TAKE ONE CAPSULE BY MOUTH TWICE A DAY BEFORE A MEAL What changed: See the new instructions.   levocetirizine 5 MG tablet Commonly known as: XYZAL Take 1 tablet (5 mg total) by mouth every evening.   losartan 100 MG tablet Commonly known as: COZAAR Take 100 mg by mouth daily.   metoprolol succinate 50 MG 24 hr tablet Commonly known as: TOPROL-XL Take 1 tablet (50 mg total) by mouth daily. Take with or immediately following a meal.   nitroGLYCERIN 0.4 MG SL tablet Commonly known as: NITROSTAT Place 1 tablet (0.4 mg total) under the tongue every 5 (five) minutes as needed for chest pain.   ondansetron 4 MG tablet Commonly known as: ZOFRAN Take 1 tablet (4 mg total) by mouth every 6 (six) hours as needed for nausea.   promethazine 12.5 MG tablet Commonly known as: PHENERGAN Take 1 tablet (12.5 mg total) by mouth every 6 (six) hours as needed for nausea or vomiting.   SYSTANE OP Place 1 drop into both eyes every 3 (three) hours as needed (dry eyes).   traZODone 50 MG tablet Commonly known as: DESYREL Take 25-50 mg by mouth at bedtime as needed for sleep.   Venlafaxine HCl 75 MG Tb24 Take 1 tablet (75 mg total) by mouth  daily. Started by: [name], DO      Allergies  Allergen Reactions  . Tramadol Palpitations   Objective: BP 108/64 (BP Location: Left Arm, Patient Position: Sitting, Cuff Size: Normal)   Pulse (!) 104   Temp (!) 97.4 F (36.3 C) (Temporal)   Ht 5' 10.5" (1.791 m)   Wt 217 lb (98.4 kg)   SpO2 97%   BMI 30.70 kg/m  General: Awake, appears stated age HEENT: MMM, EOMi Heart:  RRR, no murmurs Lungs: CTAB, no rales, wheezes or rhonchi. No accessory muscle use Abdomen: Bowel sounds present, soft, nondistended, diffuse discomfort; portholes are healing well, the incision over the umbilicus is without erythema, drainage. Neuro: Sensation over both feet decreased bilaterally on the dorsum; there is no sensation to pinprick on the plantar surface of the foot bilaterally; gait is normal Skin: Macerated skin between toes bilaterally between fourth and fifth digit; there is peeling skin on the feet as well. Medial R forearm there is a patch of hyperpigmentation and thickened and painful superficial vessel under Psych: Age appropriate judgment and insight, normal affect and mood  Assessment and Plan: Gallstone pancreatitis - Plan: CBC, Comprehensive metabolic panel  Tinea pedis of both feet - Plan: ketoconazole (NIZORAL) 2 % cream  Neuropathy - Plan: B12, TSH, T4, free, Ambulatory referral to Neurology, Venlafaxine HCl 75 MG TB24  Phlebitis  1- cont care, seems to be doing much better. F/u on labs. 2- cream for 6 weeks. 3- SNRI, stopping SSRI to get better neuropathic pain coverage; refer to neuro; could be from med in hosp? 4- Ice, Tylenol, this will get better with time.  The patient voiced understanding and agreement to the plan.  [place], DO 12/18/18  1:58 PM

## 2018-12-19 ENCOUNTER — Ambulatory Visit: Payer: Self-pay | Admitting: *Deleted

## 2018-12-19 NOTE — Telephone Encounter (Signed)
Hospital follow up w/Dr. Nani Ravens yesterday. Today, noticed blood in urine, pain with voiding and urgency. No clots/stringy blood noted. No fever. He would like further advice from Dr. Nani Ravens via [place].  Routing to PCP for advice.  Reason for Disposition . Pain or burning with passing urine  Answer Assessment - Initial Assessment Questions 1. SYMPTOM: "What's the main symptom you're concerned about?" (e.g., frequency, incontinence)     Blood in urine 2. ONSET: "When did the  start?"     This morning 3. PAIN: "Is there any pain?" If so, ask: "How bad is it?" (Scale: 1-10; mild, moderate, severe)     Rt side of abdomen. 7 on the scale. 4. CAUSE: "What do you think is causing the symptoms?"    Gall stones 5. OTHER SYMPTOMS: "Do you have any other symptoms?" (e.g., fever, flank pain, blood in urine, pain with urination)     Blood in urine, dysuria 6. PREGNANCY: "Is there any chance you are pregnant?" "When was your last menstrual period?"     na  Protocols used: URINE - BLOOD IN-A-AH, URINARY [facility]

## 2018-12-20 ENCOUNTER — Encounter: Payer: Self-pay | Admitting: Family Medicine

## 2018-12-20 ENCOUNTER — Other Ambulatory Visit: Payer: Self-pay

## 2018-12-20 ENCOUNTER — Ambulatory Visit: Payer: 59 | Admitting: Family Medicine

## 2018-12-20 VITALS — BP 112/78 | HR 111 | Temp 97.4°F | Ht 70.5 in | Wt 220.0 lb

## 2018-12-20 DIAGNOSIS — R310 Gross hematuria: Secondary | ICD-10-CM | POA: Diagnosis not present

## 2018-12-20 DIAGNOSIS — R300 Dysuria: Secondary | ICD-10-CM | POA: Diagnosis not present

## 2018-12-20 LAB — POCT URINALYSIS DIPSTICK
Bilirubin, UA: NEGATIVE
Glucose, UA: NEGATIVE
Ketones, UA: NEGATIVE
Nitrite, UA: NEGATIVE
Protein, UA: POSITIVE — AB
Spec Grav, UA: 1.015 (ref 1.010–1.025)
Urobilinogen, UA: 0.2 E.U./dL
pH, UA: 6 (ref 5.0–8.0)

## 2018-12-20 MED ORDER — SULFAMETHOXAZOLE-TRIMETHOPRIM 800-160 MG PO TABS: 1.0000 | ORAL_TABLET | Freq: Two times a day (BID) | ORAL | 0 refills | Status: AC

## 2018-12-20 MED ORDER — PREDNISONE 20 MG PO TABS: 40.0000 mg | ORAL_TABLET | Freq: Every day | ORAL | 0 refills | Status: AC

## 2018-12-20 MED ORDER — OXYCODONE HCL 5 MG PO TABS: 2.5000 mg | ORAL_TABLET | Freq: Four times a day (QID) | ORAL | 0 refills | Status: DC | PRN

## 2018-12-20 NOTE — Progress Notes (Signed)
Chief Complaint  Patient presents with  . Hematuria    [name] is a 58 y.o. male here for possible UTI.  Duration: 1 week. Symptoms: urinary frequency, hematuria at end of stream and dysuria Denies: fever, nausea, vomiting and flank pain, discharge He is not circumcised.  Has never had UTI before. Was told a stone broke lose during surg, has been trying to take Flomax, but AE's prevent him from using routinely.  ROS:  Constitutional: denies fever GU: As noted in HPI  Past Medical History:  Diagnosis Date  . BPH (benign prostatic hyperplasia)   . CAD (coronary artery disease)    a. NSTEMI troponin >65 with cath 11/2016 S/p PTCA & DES to mid circumflex. b. 05/2018 s/p DES to [facility].  Marland Kitchen Chronic chest pain   . Chronic combined systolic and diastolic CHF (congestive heart failure) ([place])    a. EF 45% in 2018. b. EF 55-60% by echo 01/2017. c. EF 48% by nuc 07/2017.  . CKD (chronic kidney disease), stage II   . Cluster headache    hx of  . Complication of anesthesia    difficult waking up"  . Diverticulosis   . Drug abuse ([place])    hx of  . GERD (gastroesophageal reflux disease)   . Hiatal hernia   . Hyperlipidemia   . Hypertension   . Hypothyroidism   . Nontoxic multinodular goiter   . NSTEMI (non-ST elevated myocardial infarction) ([place])   . Perforated nasal septum   . Pulmonary nodule --- CT 10/19/2014: Nodule is stable, no further routine x-rays 01/06/2009  . Thrombocytopenia ([place])   . TIA (transient ischemic attack)   . Trigeminal neuralgia   . Tubular adenoma of colon 12/2015  . Unspecified asthma(493.90)     BP 112/78 (BP Location: Left Arm, Patient Position: Sitting, Cuff Size: Large)   Pulse (!) 111   Temp (!) 97.4 F (36.3 C) (Oral)   Ht 5' 10.5" (1.791 m)   Wt 220 lb (99.8 kg)   SpO2 96%   BMI 31.12 kg/m  General: Awake, alert, appears stated age Heart: RRR Lungs: CTAB, normal respiratory effort, no accessory muscle usage Abd: BS+, soft, NT, ND, no  masses or organomegaly MSK: No CVA tenderness, neg Lloyd's sign Psych: Age appropriate judgment and insight  Dysuria - Plan: Ambulatory referral to Urology, POCT Urinalysis Dipstick, Urine Culture  Gross hematuria - Plan: Ambulatory referral to Urology, POCT Urinalysis Dipstick, Urine Culture  He did void this AM so nitrites not reliable. +LE and blood, will tx for UTI. If no better or if concern for stone, he will hopefully have a urology appt for next week.  F/u as originally scheduled otherwise. . The patient voiced understanding and agreement to the plan.  [place], DO 12/20/18 9:55 AM

## 2018-12-20 NOTE — Patient Instructions (Signed)
Stay hydrated.   Warning signs/symptoms: Uncontrollable nausea/vomiting, fevers, worsening symptoms despite treatment, confusion.  Give [place] around 2 business days to get culture back to you.  If things get much better, consider cancelling your urology appointment. I am hoping things get better with the antibiotic.  Do not drink alcohol, do any illicit/street drugs, drive or do anything that requires alertness while on this medicine.   Let us know if you need anything.

## 2018-12-20 NOTE — Telephone Encounter (Signed)
Noted. Evaluated pt today.

## 2018-12-22 LAB — URINE CULTURE
MICRO NUMBER:: 823443
SPECIMEN QUALITY:: ADEQUATE

## 2018-12-31 ENCOUNTER — Encounter: Payer: Self-pay | Admitting: Neurology

## 2019-01-02 ENCOUNTER — Ambulatory Visit: Payer: 59 | Admitting: Family Medicine

## 2019-01-02 ENCOUNTER — Other Ambulatory Visit: Payer: Self-pay

## 2019-01-02 ENCOUNTER — Ambulatory Visit (INDEPENDENT_AMBULATORY_CARE_PROVIDER_SITE_OTHER): Payer: 59 | Admitting: Pharmacist

## 2019-01-02 ENCOUNTER — Encounter: Payer: Self-pay | Admitting: Family Medicine

## 2019-01-02 ENCOUNTER — Encounter: Payer: Self-pay | Admitting: Pharmacist

## 2019-01-02 VITALS — BP 122/88 | HR 85 | Temp 97.8°F | Resp 16 | Ht 70.5 in | Wt 227.0 lb

## 2019-01-02 VITALS — BP 120/80 | HR 91

## 2019-01-02 DIAGNOSIS — I10 Essential (primary) hypertension: Secondary | ICD-10-CM

## 2019-01-02 DIAGNOSIS — R310 Gross hematuria: Secondary | ICD-10-CM | POA: Diagnosis not present

## 2019-01-02 DIAGNOSIS — R1084 Generalized abdominal pain: Secondary | ICD-10-CM | POA: Diagnosis not present

## 2019-01-02 DIAGNOSIS — E7800 Pure hypercholesterolemia, unspecified: Secondary | ICD-10-CM | POA: Diagnosis not present

## 2019-01-02 DIAGNOSIS — N410 Acute prostatitis: Secondary | ICD-10-CM

## 2019-01-02 DIAGNOSIS — R300 Dysuria: Secondary | ICD-10-CM

## 2019-01-02 LAB — POC URINALSYSI DIPSTICK (AUTOMATED)
Bilirubin, UA: NEGATIVE
Glucose, UA: NEGATIVE
Ketones, UA: NEGATIVE
Nitrite, UA: NEGATIVE
Protein, UA: POSITIVE — AB
Spec Grav, UA: 1.02 (ref 1.010–1.025)
Urobilinogen, UA: 0.2 E.U./dL
pH, UA: 6 (ref 5.0–8.0)

## 2019-01-02 MED ORDER — OXYCODONE HCL 5 MG PO TABS: 2.5000 mg | ORAL_TABLET | Freq: Four times a day (QID) | ORAL | 0 refills | Status: DC | PRN

## 2019-01-02 MED ORDER — SULFAMETHOXAZOLE-TRIMETHOPRIM 800-160 MG PO TABS: 1.0000 | ORAL_TABLET | Freq: Two times a day (BID) | ORAL | 0 refills | Status: DC

## 2019-01-02 NOTE — Patient Instructions (Signed)
Please come tomorrow AM for labs I will call you tomorrow or Monday with the results Call us at [phone] with any questions or concerns

## 2019-01-02 NOTE — Progress Notes (Signed)
Patient ID: [name]                 DOB: Jun 14, 1960                    MRN: [id number]     HPI: [name] is a 58 y.o. male patient referred to lipid clinic by [facility]. PMH is significant for CAD (MI s/p DES to Cx 11/2016), chronic combined CHF with normalized LVEF, HTN, HLD, tobacco abuse, GERD, chronic nausea, BPH, cluster headache, prior drug abuse, thyroid goiter, hypothyroidism, TIA, trigeminal neuralgia, CKD III, asthma, thrombocytopenia and abnormal LFTs   Patient statin was stopped at discharge 7/10 most likely due to elevated LFT. Patient has a known history of fatty liver. He had his gallbladder removed 2-3 weeks ago. LFTs have been improving.   Patient complains that he is on too many medications. States his blood pressure has been low at home 118/78, 90/56. States he occasionally feels dizzy/lightheaded.  Patient asking if he is on any meds that might be causing a tremor in his hands. I do not see any medications that might cause this. Beta blockers can cause numbness, but not usually a tremor.  Current Medications: Repatha 140mg  q 14 days, Zetia 10mg  daily Intolerances: atorvastatin and pravastatin documented, however, patient does not recall having any side effects on any of his statin medications.  Risk Factors: ASCVD LDL goal: <70  Diet: Patient is a Art gallery manager and states he has been watching his diet by limiting his sodium intake, limiting trans and saturated fats, and reducing the amount of starch in his diet. For a typical breakfast, patient reports having oatmeal, yogurt, and a couple of eggs. Patient report lunch normally just consists of a salad (chicken caesar salad, mixed greens, cranberries, and boiled eggs). Will also occasionally make homemade soup with fresh vegetables or chicken. For dinner, patient reports having a chicken salad, crab cake, seafood, and a lot of vegetables. Patient states he avoids shrimp because of the high cholesterol content.    Family History: The patient's family history includes CAD in his father; Cancer in his father; Diabetes in his mother; Heart disease in his father and maternal aunt; Heart failure in his father; Hyperlipidemia in his mother; Hypertension in his father and mother; Mesothelioma in his father; Sudden death in his father. There is no history of Prostate cancer or Colon cancer.  Labs:Lipid panel 08/04/2018: TC 262, TG 151, HDL 97, LDL 135 (rosuvastatin 40 mg daily, Zetia 10 mg daily)  Lipid panel 06/08/2018: TC 292, TG 39, HDL 110, LDL 174 (rosuvastatin 40 mg daily, Zetia 10 mg daily)  Lipid panel 07/23/2017: TC 253, TG 99, HDL 67, LDL 166 (rosuvastatin 40 mg daily, Zetia 10 mg daily)    Past Medical History:  Diagnosis Date  . BPH (benign prostatic hyperplasia)   . CAD (coronary artery disease)    a. NSTEMI troponin >65 with cath 11/2016 S/p PTCA & DES to mid circumflex. b. 05/2018 s/p DES to [facility].  Marland Kitchen Chronic chest pain   . Chronic combined systolic and diastolic CHF (congestive heart failure) ([place])    a. EF 45% in 2018. b. EF 55-60% by echo 01/2017. c. EF 48% by nuc 07/2017.  . CKD (chronic kidney disease), stage II   . Cluster headache    hx of  . Complication of anesthesia    difficult waking up"  . Diverticulosis   . Drug abuse ([place])    hx of  . GERD (  gastroesophageal reflux disease)   . Hiatal hernia   . Hyperlipidemia   . Hypertension   . Hypothyroidism   . Nontoxic multinodular goiter   . NSTEMI (non-ST elevated myocardial infarction) ([place])   . Perforated nasal septum   . Pulmonary nodule --- CT 10/19/2014: Nodule is stable, no further routine x-rays 01/06/2009  . Thrombocytopenia ([place])   . TIA (transient ischemic attack)   . Trigeminal neuralgia   . Tubular adenoma of colon 12/2015  . Unspecified asthma(493.90)     Current Outpatient Medications on File Prior to Visit  Medication Sig Dispense Refill  . amLODipine (NORVASC) 10 MG tablet TAKE ONE TABLET BY MOUTH DAILY 90 tablet 2   . aspirin EC 81 MG tablet Take 81 mg by mouth daily.    . calcium carbonate (TUMS EX) 750 MG chewable tablet Chew 2 tablets by mouth as needed for heartburn.    . clopidogrel (PLAVIX) 75 MG tablet Take 75 mg by mouth daily.    . Evolocumab (REPATHA SURECLICK) [id number] MG/ML SOAJ Inject 140 mg into the skin every 14 (fourteen) days. 2 pen 11  . ezetimibe (ZETIA) 10 MG tablet Take 1 tablet (10 mg total) by mouth daily. 90 tablet 1  . fluticasone (FLONASE) 50 MCG/ACT nasal spray Place 2 sprays into both nostrils daily as needed for allergies or rhinitis.    Marland Kitchen isosorbide mononitrate (IMDUR) 30 MG 24 hr tablet Take 1 tablet (30 mg total) by mouth daily. 90 tablet 2  . ketoconazole (NIZORAL) 2 % cream 1 application between toes and on affected areas on feet daily for 6 weeks. 60 g 0  . lansoprazole (PREVACID) 30 MG capsule TAKE ONE CAPSULE BY MOUTH TWICE A DAY BEFORE A MEAL (Patient taking differently: Take 30 mg by mouth 2 (two) times daily before a meal. ) 180 capsule 0  . levocetirizine (XYZAL) 5 MG tablet Take 1 tablet (5 mg total) by mouth every evening. 30 tablet 2  . losartan (COZAAR) 100 MG tablet Take 100 mg by mouth daily.    . metoprolol succinate (TOPROL-XL) 50 MG 24 hr tablet Take 1 tablet (50 mg total) by mouth daily. Take with or immediately following a meal. 30 tablet 3  . nitroGLYCERIN (NITROSTAT) 0.4 MG SL tablet Place 1 tablet (0.4 mg total) under the tongue every 5 (five) minutes as needed for chest pain. 25 tablet 5  . ondansetron (ZOFRAN) 4 MG tablet Take 1 tablet (4 mg total) by mouth every 6 (six) hours as needed for nausea. 30 tablet 0  . oxyCODONE (OXY IR/ROXICODONE) 5 MG immediate release tablet Take 0.5-1 tablets (2.5-5 mg total) by mouth every 6 (six) hours as needed for severe pain. 12 tablet 0  . Polyethyl Glycol-Propyl Glycol (SYSTANE OP) Place 1 drop into both eyes every 3 (three) hours as needed (dry eyes).     . promethazine (PHENERGAN) 12.5 MG tablet Take 1 tablet (12.5 mg  total) by mouth every 6 (six) hours as needed for nausea or vomiting. 15 tablet 0  . traZODone (DESYREL) 50 MG tablet Take 25-50 mg by mouth at bedtime as needed for sleep.    . Venlafaxine HCl 75 MG TB24 Take 1 tablet (75 mg total) by mouth daily. 30 tablet 2   No current facility-administered medications on file prior to visit.     Allergies  Allergen Reactions  . Tramadol Palpitations    Assessment/Plan:  1. Hyperlipidemia - No recent lipid panel since starting Repatha. LDL was 137 on rosuvastatin,  which is above goal of <70. I will recheck LFT if improving since gallbladder surgery/stable will resume rosuvastatin. Will also check lipids on Repatha. Advised that we may be able to stop Zetia if we resume rosuvastatin to reduce his pill burden. Will call patient with lab results and advise of plan at that time.  2. HTN: blood pressure at goal of <130/80 in clinic today. Blood pressure not too low. Will have patient continue to monitor at home. If continues to run too low ([id number] systolic) will plan to reduce amlodipine dose.    Thank you,  Ramond Dial, Pharm.D, [place]  [id number], [place], [place] [address]  Phone: [phone]; Fax: [phone]

## 2019-01-02 NOTE — Progress Notes (Addendum)
[place] at [facility] [address], [place], [place] [address] [phone]  Date:  01/02/2019   Name:  [name]   DOB:  07/02/1960   MRN:  [id number]  PCP:  [name], DO    Chief Complaint: Dysuria [facility] felt urgency, 2 weeks off and on, hematuria, given antibiotics, steroid and pain medication)   History of Present Illness:  [name] is a 58 y.o. very pleasant male patient who presents with the following:  Patient of Dr. Nani Ravens with complicated past medical history I saw this patient in June with concern of a syncopal episode.  He has history of CAD and an STEMI treated with stent in 2018, repeat stent in February of this year.  He was hospitalized in March of this year with unstable angina, had a cath but no stent.  I sent him to the emergency room for further evaluation at that time He then ended up in the hospital again about 1 month ago with acute gallstone pancreatitis, underwent a laparoscopic cholecystectomy on August 17 He is having some discomfort at his umbilical incision, is seeing his general surgeon tomorrow for follow-up  Here today with concern of recurrent dysuria He saw Dr. Nani Ravens approximately 2 weeks ago with concern of urinary frequency, hematuria and dysuria He was given prescriptions for oxycodone, prednisone, Septra  Urine culture from 8/28 was positive for relatively low colony count of staph aureus He took the steroid and abx and got better for a while. This past Sunday- today is Thursday- he noted urinary urgency again  This has gotten worse He notes hematuria again the last 48 hours No fever No vomiting, he is able to eat He has mild back pain He has pain with urinary stream   He is seeing neurology tomorrow as well for numbness in his extremities  He notes that he did have a kidney stone years ago  No penile discharge, no new sex partners Married for 25  years No history of prostatitis  12/20/2018  2   12/20/2018  Oxycodone Hcl 5 MG Tablet  12.00  3 Ni Wen   [id number]   Har (2042)   0  30.00 MME  Comm Ins   [place]  12/11/2018  2   12/11/2018  Oxycodone Hcl 5 MG Tablet  20.00  3 Br Meu   [id number]   Har (2042)   0  50.00 MME  Comm Ins   [place]  04/20/2017  2   03/19/2017  Pentazocine-Naloxone Tablet  20.00  2 Ke Kuz   [id number]   Har (2042)   0  185.00 MME  Comm Ins   [place]  04/19/2017  2   04/19/2017  Acetaminophen-Cod #3 Tablet  8.00  1 Ke Kuz   [id number]   Har (2042)   0  36.00 MME      Pt states he threw away the rest of the oxycodone that he did not use from his prescription given on 8/28    Patient Active Problem List   Diagnosis Date Noted  . Acute pancreatitis   . Hypokalemia   . Elevated lipase 12/03/2018  . Metabolic acidosis [id number]  . Intractable vomiting with nausea 12/02/2018  . Insomnia 11/20/2018  . Fatty liver 11/06/2018  . Dehydration 10/29/2018  . Nausea & vomiting 10/29/2018  . Elevated LFTs 10/29/2018  . Lactic acidosis 07/23/2018  . Status post coronary artery stent placement   . History of  coronary artery disease   . Unstable angina ([place]) 06/08/2018  . S/P angioplasty with stent   . Chronic heart failure with preserved ejection fraction (HFpEF) ([place])   . Situational depression 06/07/2018  . Palpitations 05/28/2018  . Chronic cough 05/27/2018  . Gross hematuria 05/27/2018  . Chest pain 07/22/2017  . Ischemic cardiomyopathy 02/05/2017  . Obesity 12/08/2016  . Chronic diastolic CHF (congestive heart failure) ([place]) 12/08/2016  . Atypical chest pain 12/07/2016  . NSTEMI (non-ST elevated myocardial infarction) ([place]) 11/26/2016  . Intractable nausea and vomiting 10/09/2016  . Nausea vomiting and diarrhea 10/09/2016  . Enteritis due to Clostridium difficile 10/30/2015  . Malnutrition of moderate degree 10/21/2015  . Acute kidney injury ([place]) 10/20/2015  . PCP NOTES >>>>>>>>>>>>>>>>> 09/30/2015  . Hypercholesteremia  09/22/2015  . Renal mass, left 11/02/2014  . CAD (coronary artery disease)   . Drug abuse and dependence ([place]) 09/18/2014  . Visit for preventive health examination 02/04/2014  . Screening for ischemic heart disease 02/04/2014  . BPH associated with nocturia 12/24/2013  . Hypothyroidism 12/24/2013  . Chronic ethmoidal sinusitis 12/24/2013  . Low back pain radiating to right leg 05/10/2012  . Thrombocytopenia ([place]) 12/05/2011  . Tobacco use 11/30/2011  . Anxiety 12/26/2010  . Transient cerebral ischemia 01/07/2010  . Asthma 01/19/2009  . Pulmonary nodule --- CT 10/19/2014: Nodule is stable, no further routine x-rays 01/06/2009  . Nontoxic multinodular goiter 12/31/2008  . DYSPNEA ON EXERTION 12/15/2008  . Trigeminal neuralgia 07/24/2007  . Dyslipidemia 12/27/2006  . Cluster headache 12/27/2006  . Essential hypertension 12/27/2006  . GERD 12/27/2006    Past Medical History:  Diagnosis Date  . BPH (benign prostatic hyperplasia)   . CAD (coronary artery disease)    a. NSTEMI troponin >65 with cath 11/2016 S/p PTCA & DES to mid circumflex. b. 05/2018 s/p DES to [facility].  Marland Kitchen Chronic chest pain   . Chronic combined systolic and diastolic CHF (congestive heart failure) ([place])    a. EF 45% in 2018. b. EF 55-60% by echo 01/2017. c. EF 48% by nuc 07/2017.  . CKD (chronic kidney disease), stage II   . Cluster headache    hx of  . Complication of anesthesia    difficult waking up"  . Diverticulosis   . Drug abuse ([place])    hx of  . GERD (gastroesophageal reflux disease)   . Hiatal hernia   . Hyperlipidemia   . Hypertension   . Hypothyroidism   . Nontoxic multinodular goiter   . NSTEMI (non-ST elevated myocardial infarction) ([place])   . Perforated nasal septum   . Pulmonary nodule --- CT 10/19/2014: Nodule is stable, no further routine x-rays 01/06/2009  . Thrombocytopenia ([place])   . TIA (transient ischemic attack)   . Trigeminal neuralgia   . Tubular adenoma of colon 12/2015  .  Unspecified asthma(493.90)     Past Surgical History:  Procedure Laterality Date  . APPENDECTOMY    . CARDIAC CATHETERIZATION N/A 11/25/2014   Procedure: Left Heart Cath and Coronary Angiography;  Surgeon: Belva Crome, MD;  Location: [place] [facility];  Service: Cardiovascular;  Laterality: N/A;  . CARDIAC CATHETERIZATION  12/08/2008   Archie Endo 08/24/2010  . CHOLECYSTECTOMY N/A 12/09/2018   Procedure: LAPAROSCOPIC CHOLECYSTECTOMY WITH INTRAOPERATIVE CHOLANGIOGRAM;  Surgeon: Erroll Luna, MD;  Location: [place];  Service: General;  Laterality: N/A;  . COLECTOMY  ~ 1976   "I had a blockage"  . CORONARY STENT INTERVENTION N/A 11/27/2016   Procedure: CORONARY STENT INTERVENTION;  Surgeon: Belva Crome, MD;  Location: [place] [facility];  Service: Cardiovascular;  Laterality: N/A;  . CORONARY STENT INTERVENTION N/A 06/10/2018   Procedure: CORONARY STENT INTERVENTION;  Surgeon: Nelva Bush, MD;  Location: [place] [facility];  Service: Cardiovascular;  Laterality: N/A;  . CORONARY STENT PLACEMENT  11/27/2016   STENT XIENCE ALPINE RX 3.0X15 drug eluting stent was successfully placed  . CYST EXCISION Left 10/31/2016   Jaw  . DESTRUCTION TRIGEMINAL NERVE VIA NEUROLYTIC AGENT  x 3 , R side last ~2010  . KNEE ARTHROSCOPY Right 06/26/2000   Arthroscopy followed by open lateral release/notes 09/06/2010  . LEFT HEART CATH AND CORONARY ANGIOGRAPHY N/A 11/27/2016   Procedure: LEFT HEART CATH AND CORONARY ANGIOGRAPHY;  Surgeon: Belva Crome, MD;  Location: [place] [facility];  Service: Cardiovascular;  Laterality: N/A;  . LEFT HEART CATH AND CORONARY ANGIOGRAPHY N/A 12/07/2016   Procedure: LEFT HEART CATH AND CORONARY ANGIOGRAPHY;  Surgeon: Nelva Bush, MD;  Location: [place] [facility];  Service: Cardiovascular;  Laterality: N/A;  . LEFT HEART CATH AND CORONARY ANGIOGRAPHY N/A 06/10/2018   Procedure: LEFT HEART CATH AND CORONARY ANGIOGRAPHY;  Surgeon: Nelva Bush, MD;  Location: [place] [facility]  [facility];  Service: Cardiovascular;  Laterality: N/A;  . LEFT HEART CATH AND CORONARY ANGIOGRAPHY N/A 07/24/2018   Procedure: LEFT HEART CATH AND CORONARY ANGIOGRAPHY;  Surgeon: Belva Crome, MD;  Location: [place] [facility];  Service: Cardiovascular;  Laterality: N/A;  . 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 Ardine Bjork 09/06/2010  . SHOULDER HEMI-ARTHROPLASTY Right 05/26/2010   revision/notes 05/27/2010    Social History   Tobacco Use  . Smoking status: Former Smoker    Packs/day: 0.25    Years: 12.00    Pack years: 3.00    Types: Cigarettes    Quit date: 11/15/2016    Years since quitting: 2.1  . Smokeless tobacco: Never Used  Substance Use Topics  . Alcohol use: Yes    Alcohol/week: 1.0 standard drinks    Types: 1 Shots of liquor per week    Comment: occ  . Drug use: No    Family History  Problem Relation Age of Onset  . Diabetes Mother   . Hyperlipidemia Mother   . Hypertension Mother   . Hypertension Father   . Heart disease Father   . Cancer Father   . Sudden death Father        OCT 08/06/2009  . CAD Father   . Heart failure Father   . Mesothelioma Father   . Heart disease Maternal Aunt   . Prostate cancer Neg Hx   . Colon cancer Neg Hx     Allergies  Allergen Reactions  . Tramadol Palpitations    Medication list has been reviewed and updated.  Current Outpatient Medications on File Prior to Visit  Medication Sig Dispense Refill  . amLODipine (NORVASC) 10 MG tablet TAKE ONE TABLET BY MOUTH DAILY 90 tablet 2  . aspirin EC 81 MG tablet Take 81 mg by mouth daily.    . calcium carbonate (TUMS EX) 750 MG chewable tablet Chew 2 tablets by mouth as needed for heartburn.    . clopidogrel (PLAVIX) 75 MG tablet Take 75 mg by mouth daily.    . Evolocumab (REPATHA SURECLICK) [id number] MG/ML SOAJ Inject 140 mg into the skin every 14 (fourteen) days. 2 pen 11  . ezetimibe (ZETIA) 10 MG tablet Take 1 tablet (10 mg total) by mouth daily.  90 tablet 1  . fluticasone  (FLONASE) 50 MCG/ACT nasal spray Place 2 sprays into both nostrils daily as needed for allergies or rhinitis.    Marland Kitchen isosorbide mononitrate (IMDUR) 30 MG 24 hr tablet Take 1 tablet (30 mg total) by mouth daily. 90 tablet 2  . ketoconazole (NIZORAL) 2 % cream 1 application between toes and on affected areas on feet daily for 6 weeks. 60 g 0  . lansoprazole (PREVACID) 30 MG capsule TAKE ONE CAPSULE BY MOUTH TWICE A DAY BEFORE A MEAL (Patient taking differently: Take 30 mg by mouth 2 (two) times daily before a meal. ) 180 capsule 0  . levocetirizine (XYZAL) 5 MG tablet Take 1 tablet (5 mg total) by mouth every evening. 30 tablet 2  . losartan (COZAAR) 100 MG tablet Take 100 mg by mouth daily.    . metoprolol succinate (TOPROL-XL) 50 MG 24 hr tablet Take 1 tablet (50 mg total) by mouth daily. Take with or immediately following a meal. 30 tablet 3  . nitroGLYCERIN (NITROSTAT) 0.4 MG SL tablet Place 1 tablet (0.4 mg total) under the tongue every 5 (five) minutes as needed for chest pain. 25 tablet 5  . Polyethyl Glycol-Propyl Glycol (SYSTANE OP) Place 1 drop into both eyes every 3 (three) hours as needed (dry eyes).     . promethazine (PHENERGAN) 12.5 MG tablet Take 1 tablet (12.5 mg total) by mouth every 6 (six) hours as needed for nausea or vomiting. 15 tablet 0  . traZODone (DESYREL) 50 MG tablet Take 25-50 mg by mouth at bedtime as needed for sleep.    . Venlafaxine HCl 75 MG TB24 Take 1 tablet (75 mg total) by mouth daily. 30 tablet 2   No current facility-administered medications on file prior to visit.     Review of Systems:  As per HPI- otherwise negative.  No urinary retention Physical Examination: Vitals:   01/02/19 1523  BP: 122/88  Pulse: 85  Resp: 16  Temp: 97.8 F (36.6 C)  SpO2: 98%   Vitals:   01/02/19 1523  Weight: 227 lb (103 kg)  Height: 5' 10.5" (1.791 m)   Body mass index is 32.11 kg/m. Ideal Body Weight: Weight in (lb) to have BMI = 25: 176.4  GEN: WDWN, NAD,  Non-toxic, A & O x 3, well-appearing gentleman, overweight. HEENT: Atraumatic, Normocephalic. Neck supple. No masses, No LAD. Ears and Nose: No external deformity. CV: RRR, No M/G/R. No JVD. No thrill. No extra heart sounds. PULM: CTA B, no wheezes, crackles, rhonchi. No retractions. No resp. distress. No accessory muscle use. ABD: S, ND, +BS. No rebound. No HSM.  Healing incisions from recent lap chole, he has tenderness at a tiny umbilical hernia.  He states this has been present for a month, no change EXTR: No c/c/e NEURO Normal gait.  PSYCH: Normally interactive. Conversant. Not depressed or anxious appearing.  Calm demeanor.  Testicles and penis are normal, no tenderness, lesions, discharge.  No inguinal hernias apparent Prostate is quite tender to digital exam  Results for orders placed or performed in visit on 01/02/19  POCT Urinalysis Dipstick (Automated)  Result Value Ref Range   Color, UA amber    Clarity, UA cloudy    Glucose, UA Negative Negative   Bilirubin, UA negative    Ketones, UA negative    Spec Grav, UA 1.020 1.010 - 1.025   Blood, UA trace    pH, UA 6.0 5.0 - 8.0   Protein, UA Positive (A) Negative  Urobilinogen, UA 0.2 0.2 or 1.0 E.U./dL   Nitrite, UA negative    Leukocytes, UA Moderate (2+) (A) Negative    Assessment and Plan: Gross hematuria - Plan: Urine Culture, POCT Urinalysis Dipstick (Automated), CT Abdomen Pelvis Wo Contrast, CANCELED: Urine Culture, CANCELED: CT Abdomen Pelvis Wo Contrast, CANCELED: POCT Urinalysis Dipstick (Automated), CANCELED: CT Abdomen Pelvis Wo Contrast  Dysuria - Plan: Urine Culture, POCT Urinalysis Dipstick (Automated), CANCELED: Urine Culture, CANCELED: POCT Urinalysis Dipstick (Automated)  Generalized abdominal pain - Plan: CT Abdomen Pelvis Wo Contrast, oxyCODONE (OXY IR/ROXICODONE) 5 MG immediate release tablet, CANCELED: CT Abdomen Pelvis Wo Contrast, CANCELED: CT Abdomen Pelvis Wo Contrast  Acute prostatitis - Plan:  sulfamethoxazole-trimethoprim (BACTRIM DS) 800-160 MG tablet  Here today with recurrent dysuria and hematuria Dr. Nani Ravens has already referred him to see urology, reminded patient this is important due to history of smoking and risk of bladder cancer Differential diagnosis today includes nephrolithiasis and prostatitis Await urine culture Start Septra double strength twice a day-advised patient that we will treat him for at least 2 weeks, up to 4 or 6 weeks if necessary Obtain CT scan to evaluate for kidney stone-he did have a contrasted CT on August 10, but a noncontrasted CT will be necessary to look for stones  Refill 12 oxycodone use as needed for pain   CT scan was ordered at [facility], we were told the patient had a CT done this evening.  I called patient 9 PM to check on report, he states that his CT was not done.  We will work on this again tomorrow  Signed Lamar Blinks, MD  Received his urine culture 9/13 Called pt to check on him I do think he has prostatitis CT is tomorrow No fever He saw his surgeon and his CT has been changed to with and without contrast - this should show if any complications of his prostatitis such as an abscess He is not feeling much better yet but is not worse  Results for orders placed or performed in visit on 01/02/19  Urine Culture  Result Value Ref Range   MICRO NUMBER: [id number]    SPECIMEN QUALITY: Adequate    Sample Source URINE    STATUS: FINAL    ISOLATE 1: Staphylococcus aureus (A)       Susceptibility   Staphylococcus aureus - URINE CULTURE POSITIVE 1    VANCOMYCIN 1 Sensitive     CIPROFLOXACIN <=0.5 Sensitive     LEVOFLOXACIN <=0.12 Sensitive     GENTAMICIN <=0.5 Sensitive     NITROFURANTOIN <=16 Sensitive     OXACILLIN* 0.5 Sensitive      * Oxacillin-susceptible staphylococci aresusceptible to other penicillinase-stablepenicillins (e.g. Methicillin, Nafcillin), beta-lactam/beta-lactamase inhibitor combinations, andcephems  with staphylococcal indications, includingCefazolin.    TETRACYCLINE >=16 Resistant     TRIMETH/SULFA* <=10 Sensitive      * Oxacillin-susceptible staphylococci aresusceptible to other penicillinase-stablepenicillins (e.g. Methicillin, Nafcillin), beta-lactam/beta-lactamase inhibitor combinations, andcephems with staphylococcal indications, includingCefazolin.Legend:S = Susceptible  I = IntermediateR = Resistant  NS = Not susceptible* = Not tested  NR = Not reported**NN = See antimicrobic comments  POCT Urinalysis Dipstick (Automated)  Result Value Ref Range   Color, UA amber    Clarity, UA cloudy    Glucose, UA Negative Negative   Bilirubin, UA negative    Ketones, UA negative    Spec Grav, UA 1.020 1.010 - 1.025   Blood, UA trace    pH, UA 6.0 5.0 - 8.0   Protein, UA Positive (  A) Negative   Urobilinogen, UA 0.2 0.2 or 1.0 E.U./dL   Nitrite, UA negative    Leukocytes, UA Moderate (2+) (A) Negative

## 2019-01-02 NOTE — Patient Instructions (Signed)
We are going to treat you for a prostate infection with septra antibiotic twice a day for at [place] 2 weeks, may treat for up to 4 weeks depending on your symptoms We will also get a CT scan of your abdomen and pelvis today to look for a kidney stone.  This will be done at [facility] which is nearby   Please be sure to see urology as planned- they will want to do a bladder scope due to blood in your urine

## 2019-01-03 ENCOUNTER — Ambulatory Visit (INDEPENDENT_AMBULATORY_CARE_PROVIDER_SITE_OTHER): Payer: 59 | Admitting: Neurology

## 2019-01-03 ENCOUNTER — Other Ambulatory Visit: Payer: Self-pay | Admitting: Neurology

## 2019-01-03 ENCOUNTER — Other Ambulatory Visit: Payer: 59 | Admitting: *Deleted

## 2019-01-03 ENCOUNTER — Encounter: Payer: Self-pay | Admitting: Neurology

## 2019-01-03 VITALS — BP 140/90 | HR 74 | Ht 70.5 in | Wt 228.0 lb

## 2019-01-03 DIAGNOSIS — R202 Paresthesia of skin: Secondary | ICD-10-CM

## 2019-01-03 DIAGNOSIS — E7800 Pure hypercholesterolemia, unspecified: Secondary | ICD-10-CM

## 2019-01-03 LAB — LIPID PANEL
Chol/HDL Ratio: 3.9 ratio (ref 0.0–5.0)
Cholesterol, Total: 286 mg/dL — ABNORMAL HIGH (ref 100–199)
HDL: 74 mg/dL (ref >39)
LDL Chol Calc (NIH): 176 mg/dL — ABNORMAL HIGH (ref 0–99)
Triglycerides: 195 mg/dL — ABNORMAL HIGH (ref 0–149)
VLDL Cholesterol Cal: 36 mg/dL (ref 5–40)

## 2019-01-03 LAB — LDL CHOLESTEROL, DIRECT: LDL Direct: 184 mg/dL — ABNORMAL HIGH (ref 0–99)

## 2019-01-03 NOTE — Patient Instructions (Signed)
Check labs  NCS/EMG of the left arm and leg.  Do not apply lotion on your arms or legs on the day of testing  ELECTROMYOGRAM AND NERVE CONDUCTION STUDIES (EMG/NCS) INSTRUCTIONS  How to Prepare The neurologist conducting the EMG will need to know if you have certain medical conditions. Tell the neurologist and other EMG lab personnel if you: . Have a pacemaker or any other electrical medical device . Take blood-thinning medications . Have hemophilia, a blood-clotting disorder that causes prolonged bleeding Bathing Take a shower or bath shortly before your exam in order to remove oils from your skin. Don't apply lotions or creams before the exam.  What to Expect You'll likely be asked to change into a hospital gown for the procedure and lie down on an examination table. The following explanations can help you understand what will happen during the exam.  . Electrodes. The neurologist or a technician places surface electrodes at various locations on your skin depending on where you're experiencing symptoms. Or the neurologist may insert needle electrodes at different sites depending on your symptoms.  . Sensations. The electrodes will at times transmit a tiny electrical current that you may feel as a twinge or spasm. The needle electrode may cause discomfort or pain that usually ends shortly after the needle is removed. If you are concerned about discomfort or pain, you may want to talk to the neurologist about taking a short break during the exam.  . Instructions. During the needle EMG, the neurologist will assess whether there is any spontaneous electrical activity when the muscle is at rest - activity that isn't present in healthy muscle tissue - and the degree of activity when you slightly contract the muscle.  He or she will give you instructions on resting and contracting a muscle at appropriate times. Depending on what muscles and nerves the neurologist is examining, he or she may ask you to  change positions during the exam.  After your EMG You may experience some temporary, minor bruising where the needle electrode was inserted into your muscle. This bruising should fade within several days. If it persists, contact your primary care doctor.

## 2019-01-03 NOTE — Progress Notes (Signed)
[facility] Clinic Note - Initial Visit   Date: 01/03/19  [name] MRN: [id number] DOB: [birth year]   Dear Dr. Nani Ravens:  Thank you for your kind referral of [name] for consultation of paresthesias. Although his history is well known to you, please allow [place] to reiterate it for the purpose of our medical record. The patient was accompanied to the clinic by self.    History of Present Illness: [name] is a 58 y.o. right-handed male with asthma, TIA, thrombocytopenia, CKD, essential hypertension, hyperlipidemia, CKD stage II, hypothyroidism, combined CHF presenting for evaluation of numbness/tingling of the hands and feet.   He was hospitalized August 10 - December 21, 2018 for acute gallstone pancreatitis s/p cholecystectomy.  During the hospitalization, he developed numbness of the soles of the feet and lower legs. He had similar symptoms in the hands.  He says that the skin on the hands and feet peeled and since then, the sensation of his hands has returned, but he continues to have increased sensitivity and numbness over the soles of the feet.  He has some imbalance.  No falls or weakness.  He does not have any low back pain or radicular pain. He was on Ancef preoperatively, otherwise no antibiotics.   Recent lab testing shows normal TSH, HbA1c, and vitamin B12.  He does not drink alcohol regularly.  Out-side paper records, electronic medical record, and images have been reviewed where available and summarized as:  MRI lumbar spine 10/14/1999: 1.    CONGENITALLY SHORT LOWER LUMBAR PEDICLES FROM L3 THROUGH L5. 2.    LOWER LUMBAR FACET ARTHROPATHY AT L3-4, L4-5, AND L5-S1, WORST AT L3-4 ON THE LEFT. 3.    THERE IS MILD LEFT L4 NERVE ROOT ENCROACHMENT DUE TO LATERAL RECESS STENOSIS AND PROBABLY MILD L5 NERVE ROOT ENCROACHMENT DUE TO FORAMINAL NARROWING AT THE L5-S1 LEVEL.  Lab Results  Component Value Date   HGBA1C 5.4 11/27/2016   Lab  Results  Component Value Date   VITAMINB12 1,030 (H) 12/18/2018   Lab Results  Component Value Date   TSH 0.76 12/18/2018   Lab Results  Component Value Date   ESRSEDRATE 14 07/23/2017    Past Medical History:  Diagnosis Date  . BPH (benign prostatic hyperplasia)   . CAD (coronary artery disease)    a. NSTEMI troponin >65 with cath 11/2016 S/p PTCA & DES to mid circumflex. b. 05/2018 s/p DES to [facility].  Marland Kitchen Chronic chest pain   . Chronic combined systolic and diastolic CHF (congestive heart failure) ([place])    a. EF 45% in 2018. b. EF 55-60% by echo 01/2017. c. EF 48% by nuc 07/2017.  . CKD (chronic kidney disease), stage II   . Cluster headache    hx of  . Complication of anesthesia    difficult waking up"  . Diverticulosis   . Drug abuse ([place])    hx of  . GERD (gastroesophageal reflux disease)   . Hiatal hernia   . Hyperlipidemia   . Hypertension   . Hypothyroidism   . Nontoxic multinodular goiter   . NSTEMI (non-ST elevated myocardial infarction) ([place])   . Perforated nasal septum   . Pulmonary nodule --- CT 10/19/2014: Nodule is stable, no further routine x-rays 01/06/2009  . Thrombocytopenia ([place])   . TIA (transient ischemic attack)   . Trigeminal neuralgia   . Tubular adenoma of colon 12/2015  . Unspecified asthma(493.90)     Past Surgical History:  Procedure Laterality Date  .  APPENDECTOMY    . CARDIAC CATHETERIZATION N/A 11/25/2014   Procedure: Left Heart Cath and Coronary Angiography;  Surgeon: Belva Crome, MD;  Location: [facility];  Service: Cardiovascular;  Laterality: N/A;  . CARDIAC CATHETERIZATION  12/08/2008   Archie Endo 08/24/2010  . CHOLECYSTECTOMY N/A 12/09/2018   Procedure: LAPAROSCOPIC CHOLECYSTECTOMY WITH INTRAOPERATIVE CHOLANGIOGRAM;  Surgeon: Erroll Luna, MD;  Location: [facility];  Service: General;  Laterality: N/A;  . COLECTOMY  ~ 1976   "I had a blockage"  . CORONARY STENT INTERVENTION N/A 11/27/2016   Procedure: CORONARY STENT INTERVENTION;   Surgeon: Belva Crome, MD;  Location: [facility];  Service: Cardiovascular;  Laterality: N/A;  . CORONARY STENT INTERVENTION N/A 06/10/2018   Procedure: CORONARY STENT INTERVENTION;  Surgeon: Nelva Bush, MD;  Location: [facility];  Service: Cardiovascular;  Laterality: N/A;  . CORONARY STENT PLACEMENT  11/27/2016   STENT XIENCE ALPINE RX 3.0X15 drug eluting stent was successfully placed  . CYST EXCISION Left 10/31/2016   Jaw  . DESTRUCTION TRIGEMINAL NERVE VIA NEUROLYTIC AGENT  x 3 , R side last ~2010  . KNEE ARTHROSCOPY Right 06/26/2000   Arthroscopy followed by open lateral release/notes 09/06/2010  . LEFT HEART CATH AND CORONARY ANGIOGRAPHY N/A 11/27/2016   Procedure: LEFT HEART CATH AND CORONARY ANGIOGRAPHY;  Surgeon: Belva Crome, MD;  Location: [facility];  Service: Cardiovascular;  Laterality: N/A;  . LEFT HEART CATH AND CORONARY ANGIOGRAPHY N/A 12/07/2016   Procedure: LEFT HEART CATH AND CORONARY ANGIOGRAPHY;  Surgeon: Nelva Bush, MD;  Location: [place] [facility];  Service: Cardiovascular;  Laterality: N/A;  . LEFT HEART CATH AND CORONARY ANGIOGRAPHY N/A 06/10/2018   Procedure: LEFT HEART CATH AND CORONARY ANGIOGRAPHY;  Surgeon: Nelva Bush, MD;  Location: [facility];  Service: Cardiovascular;  Laterality: N/A;  . LEFT HEART CATH AND CORONARY ANGIOGRAPHY N/A 07/24/2018   Procedure: LEFT HEART CATH AND CORONARY ANGIOGRAPHY;  Surgeon: Belva Crome, MD;  Location: [facility];  Service: Cardiovascular;  Laterality: N/A;  . 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 Ardine Bjork 09/06/2010  . SHOULDER HEMI-ARTHROPLASTY Right 05/26/2010   revision/notes 05/27/2010     Medications:  Outpatient Encounter Medications as of 01/03/2019  Medication Sig Note  . amLODipine (NORVASC) 10 MG tablet TAKE ONE TABLET BY MOUTH DAILY   . aspirin EC 81 MG tablet Take 81 mg by mouth daily.   . calcium carbonate (TUMS EX) 750 MG chewable  tablet Chew 2 tablets by mouth as needed for heartburn.   . clopidogrel (PLAVIX) 75 MG tablet Take 75 mg by mouth daily. 12/03/2018: Lf on 07-04-18 DS 90  . Evolocumab (REPATHA SURECLICK) 194 MG/ML SOAJ Inject 140 mg into the skin every 14 (fourteen) days. 12/03/2018: Lf on 09-13-18  ds30  . ezetimibe (ZETIA) 10 MG tablet Take 1 tablet (10 mg total) by mouth daily.   . fluticasone (FLONASE) 50 MCG/ACT nasal spray Place 2 sprays into both nostrils daily as needed for allergies or rhinitis.   Marland Kitchen isosorbide mononitrate (IMDUR) 30 MG 24 hr tablet Take 1 tablet (30 mg total) by mouth daily.   Marland Kitchen ketoconazole (NIZORAL) 2 % cream 1 application between toes and on affected areas on feet daily for 6 weeks.   . lansoprazole (PREVACID) 30 MG capsule TAKE ONE CAPSULE BY MOUTH TWICE A DAY BEFORE A MEAL (Patient taking differently: Take 30 mg by mouth 2 (two) times daily before a meal. )  12/03/2018: Lf on 08-23-18 DS 30  . levocetirizine (XYZAL) 5 MG tablet Take 1 tablet (5 mg total) by mouth every evening.   Marland Kitchen losartan (COZAAR) 100 MG tablet Take 100 mg by mouth daily.   . metoprolol succinate (TOPROL-XL) 50 MG 24 hr tablet Take 1 tablet (50 mg total) by mouth daily. Take with or immediately following a meal.   . nitroGLYCERIN (NITROSTAT) 0.4 MG SL tablet Place 1 tablet (0.4 mg total) under the tongue every 5 (five) minutes as needed for chest pain.   Marland Kitchen oxyCODONE (OXY IR/ROXICODONE) 5 MG immediate release tablet Take 0.5-1 tablets (2.5-5 mg total) by mouth every 6 (six) hours as needed for severe pain.   Vladimir Faster Glycol-Propyl Glycol (SYSTANE OP) Place 1 drop into both eyes every 3 (three) hours as needed (dry eyes).    . promethazine (PHENERGAN) 12.5 MG tablet Take 1 tablet (12.5 mg total) by mouth every 6 (six) hours as needed for nausea or vomiting.   . sulfamethoxazole-trimethoprim (BACTRIM DS) 800-160 MG tablet Take 1 tablet by mouth 2 (two) times daily. Take for 4 weeks for prostatitis   . traZODone (DESYREL)  50 MG tablet Take 25-50 mg by mouth at bedtime as needed for sleep.   . Venlafaxine HCl 75 MG TB24 Take 1 tablet (75 mg total) by mouth daily.   . [DISCONTINUED] ondansetron (ZOFRAN) 4 MG tablet Take 1 tablet (4 mg total) by mouth every 6 (six) hours as needed for nausea.   . [DISCONTINUED] oxyCODONE (OXY IR/ROXICODONE) 5 MG immediate release tablet Take 0.5-1 tablets (2.5-5 mg total) by mouth every 6 (six) hours as needed for severe pain. (Patient not taking: Reported on 01/02/2019)    No facility-administered encounter medications on file as of 01/03/2019.     Allergies:  Allergies  Allergen Reactions  . Tramadol Palpitations    Family History: Family History  Problem Relation Age of Onset  . Diabetes Mother   . Hyperlipidemia Mother   . Hypertension Mother   . Hypertension Father   . Heart disease Father   . Cancer Father   . Sudden death Father        OCT 06-05-2009  . CAD Father   . Heart failure Father   . Mesothelioma Father   . Heart disease Maternal Aunt   . Prostate cancer Neg Hx   . Colon cancer Neg Hx     Social History: Social History   Tobacco Use  . Smoking status: Former Smoker    Packs/day: 0.25    Years: 12.00    Pack years: 3.00    Types: Cigarettes    Quit date: 11/15/2016    Years since quitting: 2.1  . Smokeless tobacco: Never Used  Substance Use Topics  . Alcohol use: Yes    Alcohol/week: 1.0 standard drinks    Types: 1 Shots of liquor per week    Comment: occ  . 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   EYES: No visual changes or eye pain ENT: No hearing changes.  No history of nose bleeds.   RESPIRATORY: No cough, wheezing and shortness of breath.   CARDIOVASCULAR: Negative for  chest pain, and palpitations.   GI: Negative for  abdominal discomfort, blood in stools or black stools.  No recent change in bowel habits.   GU:  No history of incontinence.   MUSCLOSKELETAL: No history of joint pain or swelling.  No myalgias.   SKIN: Negative for lesions, rash, and itching + dry skin   HEMATOLOGY/ONCOLOGY: Negative for prolonged bleeding, bruising easily, and swollen nodes.  No history of cancer.   ENDOCRINE: Negative for cold or heat intolerance, polydipsia or goiter.   PSYCH:  No depression or anxiety symptoms.   NEURO: As Above.   Vital Signs:  BP 140/90   Pulse 74   Ht 5' 10.5" (1.791 m)   Wt 228 lb (103.4 kg)   SpO2 98%   BMI 32.25 kg/m    General Medical Exam:   General:  Well appearing, comfortable.   Eyes/ENT: see cranial nerve examination.   Neck:   No carotid bruits. Respiratory:  Clear to auscultation, good air entry bilaterally.   Cardiac:  Regular rate and rhythm, no murmur.   Extremities:  No deformities, edema, or skin discoloration.  Skin:  Small ulcer over the dorsum of the right foot at the 4th digit.  Neurological Exam: MENTAL STATUS including orientation to time, place, person, recent and remote memory, attention span and concentration, language, and fund of knowledge is normal.  Speech is not dysarthric.  CRANIAL NERVES: II:  No visual field defects.  III-IV-VI: Pupils equal round and reactive to light.  Normal conjugate, extra-ocular eye movements in all directions of gaze.  No nystagmus.  No ptosis.   V:  Normal facial sensation.    VII:  Normal facial symmetry and movements.   VIII:  Normal hearing and vestibular function.   IX-X:  Normal palatal movement.   XI:  Normal shoulder shrug and head rotation.   XII:  Normal tongue strength and range of motion, no deviation or fasciculation.  MOTOR:  No atrophy, fasciculations or abnormal movements.  No pronator drift.   Upper Extremity:  Right  Left  Deltoid  5/5   5/5   Biceps  5/5   5/5   Triceps  5/5   5/5   Infraspinatus 5/5   5/5  Medial pectoralis 5/5  5/5  Wrist extensors  5/5   5/5   Wrist flexors  5/5   5/5   Finger extensors  5/5   5/5   Finger flexors  5/5   5/5   Dorsal interossei  5/5   5/5   Abductor pollicis  5/5   5/5   Tone (Ashworth scale)  0  0   Lower Extremity:  Right  Left  Hip flexors  5/5   5/5   Hip extensors  5/5   5/5   Adductor 5/5  5/5  Abductor 5/5  5/5  Knee flexors  5/5   5/5   Knee extensors  5/5   5/5   Dorsiflexors  5/5   5/5   Plantarflexors  5/5   5/5   Toe extensors  5/5   5/5   Toe flexors  5/5   5/5   Tone (Ashworth scale)  0  0   MSRs:  Right        Left                  brachioradialis 2+  2+  biceps 2+  2+  triceps 2+  2+  patellar 2+  2+  ankle jerk 0  0  Hoffman no  no  plantar response down  down   SENSORY:  Reduced vibration at the great toe bilaterally; temperature and pin prick is reduced over the dorsum of the feet. Romberg's sign positive.   COORDINATION/GAIT: Normal finger-to- nose-finger and heel-to-shin.  Intact rapid alternating movements bilaterally.Gait mildly wide-based and stable. Unsteady with tandem and stressed gait, but able to perform   IMPRESSION: Subacute onset of paresthesias involving the hands and feet with exam shows distal paresthesias and reduced reflexes at the ankles, consistent with neuropathy.  Given the relatively abrupt onset of symptoms, other possibilities such as nutritional neuropathy or autoimmune/inflammatory etiologies which need to be evaluated for.   PLAN/RECOMMENDATIONS:  NCS/EMG of the left arm and leg Check ESR, CRP, MMA, folate, vitamin B1, SPEP with IFE, copper Patient educated on daily foot inspection, fall prevention, and safety precautions around the home. He has a healing skin ulcer over the dorsum of the right foot - patient informed to monitor for signs of poor healing  Further recommendations pending results.   Thank you for allowing me to participate in patient's care.  If I can answer any  additional questions, I would be pleased to do so.    Sincerely,    Mance Vallejo K. Posey Pronto, DO

## 2019-01-04 LAB — URINE CULTURE
MICRO NUMBER:: 866458
SPECIMEN QUALITY:: ADEQUATE

## 2019-01-05 LAB — HEPATIC FUNCTION PANEL
ALT: 77 IU/L — ABNORMAL HIGH (ref 0–44)
AST: 87 IU/L — ABNORMAL HIGH (ref 0–40)
Albumin: 4.3 g/dL (ref 3.8–4.9)
Alkaline Phosphatase: 60 IU/L (ref 39–117)
Bilirubin Total: 0.2 mg/dL (ref 0.0–1.2)
Bilirubin, Direct: 0.11 mg/dL (ref 0.00–0.40)
Total Protein: 7.3 g/dL (ref 6.0–8.5)

## 2019-01-05 LAB — SPECIMEN STATUS REPORT

## 2019-01-06 ENCOUNTER — Telehealth: Payer: Self-pay | Admitting: Pharmacist

## 2019-01-06 ENCOUNTER — Telehealth: Payer: Self-pay

## 2019-01-06 DIAGNOSIS — E785 Hyperlipidemia, unspecified: Secondary | ICD-10-CM

## 2019-01-06 DIAGNOSIS — R7989 Other specified abnormal findings of blood chemistry: Secondary | ICD-10-CM

## 2019-01-06 DIAGNOSIS — K760 Fatty (change of) liver, not elsewhere classified: Secondary | ICD-10-CM

## 2019-01-06 DIAGNOSIS — N410 Acute prostatitis: Secondary | ICD-10-CM

## 2019-01-06 DIAGNOSIS — N200 Calculus of kidney: Secondary | ICD-10-CM

## 2019-01-06 MED ORDER — TAMSULOSIN HCL 0.4 MG PO CAPS: 0.4000 mg | ORAL_CAPSULE | Freq: Every day | ORAL | 0 refills | Status: DC

## 2019-01-06 NOTE — Telephone Encounter (Signed)
Copied from [place] [id number]. Topic: General - Other >> Jan 06, 2019 10:35 AM Rayann Heman wrote: Reason for CRM: pt called and stated that he would like a call back from the office. Pt states that Dr Lorelei Pont called him Saturday but he was half asleep and would like another call back so he can understand the message better. Please advise

## 2019-01-06 NOTE — Telephone Encounter (Signed)
Spoke with patient, advised him that Dr. Fuller Plan is ok with him resuming rosuvastatin, but he would like him to schedule an appointment with his office. Patient in agreement to schedule appointment with Dr. Fuller Plan. Will resume rosuvastatin 40mg  daily. Recheck LFT in 3 weeks.

## 2019-01-06 NOTE — Telephone Encounter (Signed)
Patient states that he has been using Repatha, however he missed a dose when he was in the hospital and then his pharmacy took several days to get it in, so it sounds like patient was overdue for injection when labs were taken. He also states that he was in [place] for a week over labor Day and he ate a lot of carbs that he doesn't usually eat. We will repeat lipid panel in 8 weeks. Continue Repatha 140mg  q 14 days. Last injection was 9/11 after labs were drawn.   Patient rosuvastatin was stopped several months ago after d/c from hospital. LFT have improved some, but still about 2 x ULN. Patient recently diagnosed with fatty liver. Also had his gallbladder out about 3 weeks ago. Elevated LFT do not appear to be from statin.  Dr. Fuller Plan are you ok with patient resuming rosuvastatin with close monitoring?

## 2019-01-06 NOTE — Addendum Note (Signed)
Addended by: Lamar Blinks C on: 01/06/2019 05:09 PM   Modules accepted: Orders

## 2019-01-06 NOTE — Telephone Encounter (Signed)
Called pt as requested.  Received CT scan from [facility]- it shows a 9x45mm stone at left UVJ with no hydro Bladder appears to show cystitis Fatty liver Prostate looks normal

## 2019-01-06 NOTE — Telephone Encounter (Signed)
Urine grew staph sensitive to septra which he is taking MICRO NUMBER: [id number]   SPECIMEN QUALITY: Adequate   Sample Source URINE   STATUS: FINAL   ISOLATE 1: Staphylococcus aureusAbnormal    Comment: 50,000-100,000 CFU/mL of Staphylococcus aureus  Resulting Agency [facility]  Susceptibility   Staphylococcus aureus    URINE CULTURE POSITIVE 1    CIPROFLOXACIN <=0.5  Sensitive    GENTAMICIN <=0.5  Sensitive    LEVOFLOXACIN <=0.12  Sensitive    NITROFURANTOIN <=16  Sensitive    OXACILLIN 0.5  Sensitive1    TETRACYCLINE >=16  Resistant    TRIMETH/SULFA <=10  Sensitive2    VANCOMYCIN 1  Sensitive      Called pt- he is feeling about the same  He is taking septra Went over CT findings Would like to get a blood culture as urine grew staph and he is still not feeling great on PO abx Ordered labs and blood culture Start on flomax for stone for one month

## 2019-01-06 NOTE — Telephone Encounter (Signed)
I have reviewed his recent Epic records. Fatty liver is probably the cause of his elevated LFTs however he should schedule follow up with my office to further evaluate. In the interim OK to use a statin and monitor LFTs

## 2019-01-07 ENCOUNTER — Other Ambulatory Visit: Payer: Self-pay

## 2019-01-07 ENCOUNTER — Telehealth: Payer: Self-pay | Admitting: Family Medicine

## 2019-01-07 ENCOUNTER — Other Ambulatory Visit (INDEPENDENT_AMBULATORY_CARE_PROVIDER_SITE_OTHER): Payer: 59

## 2019-01-07 DIAGNOSIS — R1084 Generalized abdominal pain: Secondary | ICD-10-CM

## 2019-01-07 DIAGNOSIS — N200 Calculus of kidney: Secondary | ICD-10-CM | POA: Diagnosis not present

## 2019-01-07 DIAGNOSIS — N410 Acute prostatitis: Secondary | ICD-10-CM

## 2019-01-07 MED ORDER — OXYCODONE HCL 5 MG PO TABS: 2.5000 mg | ORAL_TABLET | Freq: Four times a day (QID) | ORAL | 0 refills | Status: DC | PRN

## 2019-01-07 NOTE — Telephone Encounter (Signed)
Please advise 

## 2019-01-07 NOTE — Telephone Encounter (Signed)
Pt called for Dr. Lorelei Pont / he states he is in extreme pain when he goes to the bathroom and doesn't know what to do/ Pt would like a call to advise him of what to do for relief/ please advise

## 2019-01-07 NOTE — Telephone Encounter (Signed)
Called him back- he is having a lot of pain with urination- he has a known stone at the UVJ seen on CT scan yesterday  He had worsening of his pain yesterday  He has used the oxycodone I gave him which does help some He needs this refilled   I called [facility]- they had tried to contact pt to schedule an appt but his VM was full and no answer. He needs to call them to schedule I called pt back myself to relay this info.  Was able to [facility]

## 2019-01-07 NOTE — Telephone Encounter (Signed)
Lab orders in, patient on schedule for today

## 2019-01-08 ENCOUNTER — Telehealth: Payer: Self-pay | Admitting: Family Medicine

## 2019-01-08 ENCOUNTER — Telehealth: Payer: Self-pay

## 2019-01-08 LAB — COMPREHENSIVE METABOLIC PANEL
ALT: 106 U/L — ABNORMAL HIGH (ref 0–53)
AST: 145 U/L — ABNORMAL HIGH (ref 0–37)
Albumin: 4.1 g/dL (ref 3.5–5.2)
Alkaline Phosphatase: 88 U/L (ref 39–117)
BUN: 10 mg/dL (ref 6–23)
CO2: 21 mEq/L (ref 19–32)
Calcium: 8.8 mg/dL (ref 8.4–10.5)
Chloride: 104 mEq/L (ref 96–112)
Creatinine, Ser: 0.79 mg/dL (ref 0.40–1.50)
GFR: 122.87 mL/min (ref >60.00)
Glucose, Bld: 110 mg/dL — ABNORMAL HIGH (ref 70–99)
Potassium: 3.8 mEq/L (ref 3.5–5.1)
Sodium: 143 mEq/L (ref 135–145)
Total Bilirubin: 0.6 mg/dL (ref 0.2–1.2)
Total Protein: 7.3 g/dL (ref 6.0–8.3)

## 2019-01-08 LAB — PROTEIN ELECTROPHORESIS, SERUM
Albumin ELP: 3.8 g/dL (ref 3.8–4.8)
Alpha 1: 0.3 g/dL (ref 0.2–0.3)
Alpha 2: 0.7 g/dL (ref 0.5–0.9)
Beta 2: 0.4 g/dL (ref 0.2–0.5)
Beta Globulin: 0.5 g/dL (ref 0.4–0.6)
Gamma Globulin: 1 g/dL (ref 0.8–1.7)
Total Protein: 6.8 g/dL (ref 6.1–8.1)

## 2019-01-08 LAB — CBC
HCT: 37.8 % — ABNORMAL LOW (ref 39.0–52.0)
Hemoglobin: 12.6 g/dL — ABNORMAL LOW (ref 13.0–17.0)
MCHC: 33.3 g/dL (ref 30.0–36.0)
MCV: 105 fl — ABNORMAL HIGH (ref 78.0–100.0)
Platelets: 157 10*3/uL (ref 150.0–400.0)
RBC: 3.6 Mil/uL — ABNORMAL LOW (ref 4.22–5.81)
RDW: 17.6 % — ABNORMAL HIGH (ref 11.5–15.5)
WBC: 7.7 10*3/uL (ref 4.0–10.5)

## 2019-01-08 LAB — IMMUNOFIXATION ELECTROPHORESIS
IgG (Immunoglobin G), Serum: 1101 mg/dL (ref 600–1640)
IgM, Serum: 55 mg/dL (ref 50–300)
Immunofix Electr Int: NOT DETECTED
Immunoglobulin A: 423 mg/dL — ABNORMAL HIGH (ref 47–310)

## 2019-01-08 LAB — C-REACTIVE PROTEIN: CRP: 10.6 mg/L — ABNORMAL HIGH (ref <8.0)

## 2019-01-08 LAB — FOLATE: Folate: 3.5 ng/mL — ABNORMAL LOW

## 2019-01-08 LAB — METHYLMALONIC ACID, SERUM: Methylmalonic Acid, Quant: 122 nmol/L (ref 87–318)

## 2019-01-08 LAB — COPPER, SERUM: Copper: 105 ug/dL (ref 70–175)

## 2019-01-08 MED ORDER — PROMETHAZINE HCL 12.5 MG PO TABS: 12.5000 mg | ORAL_TABLET | Freq: Four times a day (QID) | ORAL | 2 refills | Status: DC | PRN

## 2019-01-08 NOTE — Telephone Encounter (Signed)
Please advise 

## 2019-01-08 NOTE — Telephone Encounter (Signed)
Error not needed

## 2019-01-08 NOTE — Telephone Encounter (Signed)
Copied from [place] [id number]. Topic: General - Other >> Jan 06, 2019 10:35 AM Rayann Heman wrote: Reason for CRM: pt called and stated that he would like a call back from the office. Pt states that Dr Lorelei Pont called him Saturday but he was half asleep and would like another call back so he can understand the message better. Please advise >> Jan 06, 2019  5:58 PM Mcneil, Jacinto Reap wrote: Pt stated that he still has some questions and he would like a call back to discuss.

## 2019-01-08 NOTE — Telephone Encounter (Signed)
Yes already called him back

## 2019-01-08 NOTE — Telephone Encounter (Signed)
Have you spoke with patient?  Know I sent this message just a few days ago cant tell if this is the same one.

## 2019-01-08 NOTE — Telephone Encounter (Signed)
Pt would like Dr. Nani Ravens to refill promethazine (PHENERGAN) 12.5 MG tablet Pt has been taking it for a long time but never had it filled by PCP/ please advise asap

## 2019-01-09 ENCOUNTER — Telehealth: Payer: Self-pay | Admitting: Family Medicine

## 2019-01-09 ENCOUNTER — Encounter: Payer: Self-pay | Admitting: Family Medicine

## 2019-01-09 ENCOUNTER — Telehealth: Payer: Self-pay | Admitting: Cardiology

## 2019-01-09 NOTE — Telephone Encounter (Signed)
Received his labs and gave him a call He has an appt with urology later on today Advised that blood culture is negative so far  He reports that he drinks just 1-2 drinks a week He is not using tylenol  His recent CT scan did show diffuse hepatic steatosis  He has been screened for hep B and C and is negative   Will send him a copy of his labs Recommend recheck visit in 1 month to repeat LFTS and CBC, will need B12 and folate levels due to macrocytosis as well   Results for orders placed or performed in visit on 01/07/19  Blood culture (routine single)   Specimen: Blood  Result Value Ref Range   MICRO NUMBER: [id number]    SPECIMEN QUALITY: Suboptimal    Source LEFT HAND    STATUS: PRELIMINARY    Result:      No growth to date. Culture is continuously monitored for a total of 120 hours incubation. A change in status will result in a phone report followed by an updated printed culture report. Visual inspection of blood culture bottles indicates that an  excessive volume of blood may have been collected. Detection of sepsis may be compromised.    COMMENT: Aerobic and anaerobic bottle received.   Comprehensive metabolic panel  Result Value Ref Range   Sodium 143 135 - 145 mEq/L   Potassium 3.8 3.5 - 5.1 mEq/L   Chloride 104 96 - 112 mEq/L   CO2 21 19 - 32 mEq/L   Glucose, Bld 110 (H) 70 - 99 mg/dL   BUN 10 6 - 23 mg/dL   Creatinine, Ser 0.79 0.40 - 1.50 mg/dL   Total Bilirubin 0.6 0.2 - 1.2 mg/dL   Alkaline Phosphatase 88 39 - 117 U/L   AST 145 (H) 0 - 37 U/L   ALT 106 (H) 0 - 53 U/L   Total Protein 7.3 6.0 - 8.3 g/dL   Albumin 4.1 3.5 - 5.2 g/dL   Calcium 8.8 8.4 - 10.5 mg/dL   GFR 122.87 >60.00 mL/min  CBC  Result Value Ref Range   WBC 7.7 4.0 - 10.5 K/uL   RBC 3.60 (L) 4.22 - 5.81 Mil/uL   Platelets 157.0 150.0 - 400.0 K/uL   Hemoglobin 12.6 (L) 13.0 - 17.0 g/dL   HCT 37.8 (L) 39.0 - 52.0 %   MCV 105.0 (H) 78.0 - 100.0 fl   MCHC 33.3 30.0 - 36.0 g/dL   RDW 17.6 (H) 11.5  - 15.5 %

## 2019-01-09 NOTE — Telephone Encounter (Signed)
° °   [facility] Pre-operative Risk Assessment    Request for surgical clearance:  1. What type of surgery is being performed? Urterosopy with Laser Lithotripsy   2. When is this surgery scheduled? *01-16-19  3. What type of clearance is required (medical clearance vs. Pharmacy clearance to hold med vs. Both)? Medical Only  4. Are there any medications that need to be held prior to surgery and how long? Pt is on Plavix and Aspirin, pt will stay on them.- They say it is alright with them for pt to stay on them   5. Practice name and name of physician performing surgery?  Dr Nicolette Bang  6. What is your office phone number* [phone]    7.   What is your office fax number [phone]  8.   Anesthesia type (None, local, MAC, general) ? General  [name] 01/09/2019, 3:59 PM  _________________________________________________________________   (provider comments below)

## 2019-01-10 ENCOUNTER — Other Ambulatory Visit: Payer: Self-pay | Admitting: Urology

## 2019-01-10 NOTE — Telephone Encounter (Signed)
   Primary Cardiologist: Ena Dawley, MD  Chart reviewed as part of pre-operative protocol coverage. Patient was contacted 01/10/2019 in reference to pre-operative risk assessment for pending surgery as outlined below.  [name] was last seen on 12/04/2018 by Dr. Debara Pickett.  Since that day, [name] has done well from a cardiac perspective. He is now having issues with a kidney stone which is his biggest complaint in our discussion today. He denies chest pain or other anginal equivalents  Per requesting office, there is no need to hold ASA and Plavix for his procedure which we would prefer given his recent cardiac stenting.  Therefore, based on ACC/AHA guidelines, the patient would be at acceptable risk for the planned procedure without further cardiovascular testing.   I will route this recommendation to the requesting party via Epic fax function and remove from pre-op pool.  Please call with questions.  Kathyrn Drown, NP 01/10/2019, 8:18 AM

## 2019-01-13 ENCOUNTER — Other Ambulatory Visit (HOSPITAL_COMMUNITY)
Admission: RE | Admit: 2019-01-13 | Discharge: 2019-01-13 | Disposition: A | Discharge status: Home / Self Care | Payer: 59 | Source: Ambulatory Visit | Attending: Urology | Admitting: Urology

## 2019-01-13 DIAGNOSIS — Z20828 Contact with and (suspected) exposure to other viral communicable diseases: Secondary | ICD-10-CM | POA: Insufficient documentation

## 2019-01-13 DIAGNOSIS — Z01812 Encounter for preprocedural laboratory examination: Secondary | ICD-10-CM | POA: Diagnosis not present

## 2019-01-13 LAB — CULTURE, BLOOD (SINGLE): MICRO NUMBER:: 883017

## 2019-01-14 NOTE — Patient Instructions (Addendum)
DUE TO COVID-19 ONLY ONE VISITOR IS ALLOWED TO COME WITH YOU AND STAY IN THE WAITING ROOM ONLY DURING PRE OP AND PROCEDURE DAY OF SURGERY. THE 1 VISITOR MAY VISIT WITH YOU AFTER SURGERY IN YOUR PRIVATE ROOM DURING VISITING HOURS ONLY!   ONCE YOUR COVID TEST IS COMPLETED, PLEASE BEGIN THE QUARANTINE INSTRUCTIONS AS OUTLINED IN YOUR HANDOUT.                [name]    Your procedure is scheduled on: 01-16-2019   Report to [facility] Main  Entrance              Report to admitting at 1005 AM     Call this number if you have problems the morning of surgery [phone]    Remember: Do not eat food or drink liquids :After Midnight.               BRUSH YOUR TEETH MORNING OF SURGERY AND RINSE YOUR MOUTH OUT, NO CHEWING GUM CANDY OR MINTS.     Take these medicines the morning of surgery with A SIP OF WATER: ISOSORBIDE MONONITRATE (IMDUR), GABAPENTIN(NEURONTIN), VENLAFAXINE, AMLODIPINE (NORVASC), METOPROLOL SUCCINATE, BACTRIM, TAMSULOSIN (FLOMAX),Plavix , Aspirin                                 You may not have any metal on your body including hair pins and              piercings  Do not wear jewelry, make-up, lotions, powders or perfumes, deodorant             Men may shave face and neck.   Do not bring valuables to the hospital. [place].  Contacts, dentures or bridgework may not be worn into surgery.  Leave suitcase in the car. After surgery it may be brought to your room.     Patients discharged the day of surgery will not be allowed to drive home. IF YOU ARE HAVING SURGERY AND GOING HOME THE SAME DAY, YOU MUST HAVE AN ADULT TO DRIVE YOU HOME AND BE WITH YOU FOR 24 HOURS. YOU MAY GO HOME BY TAXI OR UBER OR ORTHERWISE, BUT AN ADULT MUST ACCOMPANY YOU HOME AND STAY WITH YOU FOR 24 HOURS.  Name and phone number of your driver:                Please read over the following fact sheets you were  given: _____________________________________________________________________             [facility] - Preparing for Surgery Before surgery, you can play an important role.  Because skin is not sterile, your skin needs to be as free of germs as possible.  You can reduce the number of germs on your skin by washing with CHG (chlorahexidine gluconate) soap before surgery.  CHG is an antiseptic cleaner which kills germs and bonds with the skin to continue killing germs even after washing. Please DO NOT use if you have an allergy to CHG or antibacterial soaps.  If your skin becomes reddened/irritated stop using the CHG and inform your nurse when you arrive at Short Stay. Do not shave (including legs and underarms) for at least 48 hours prior to the first CHG shower.  You may shave your face/neck. Please follow these instructions  carefully:  1.  Shower with CHG Soap the night before surgery and the  morning of Surgery.  2.  If you choose to wash your hair, wash your hair first as usual with your  normal  shampoo.  3.  After you shampoo, rinse your hair and body thoroughly to remove the  shampoo.                           4.  Use CHG as you would any other liquid soap.  You can apply chg directly  to the skin and wash                       Gently with a scrungie or clean washcloth.  5.  Apply the CHG Soap to your body ONLY FROM THE NECK DOWN.   Do not use on face/ open                           Wound or open sores. Avoid contact with eyes, ears mouth and genitals (private parts).                       Wash face,  Genitals (private parts) with your normal soap.             6.  Wash thoroughly, paying special attention to the area where your surgery  will be performed.  7.  Thoroughly rinse your body with warm water from the neck down.  8.  DO NOT shower/wash with your normal soap after using and rinsing off  the CHG Soap.                9.  Pat yourself dry with a clean towel.            10.  Wear  clean pajamas.            11.  Place clean sheets on your bed the night of your first shower and do not  sleep with pets. Day of Surgery : Do not apply any lotions/deodorants the morning of surgery.  Please wear clean clothes to the hospital/surgery center.  FAILURE TO FOLLOW THESE INSTRUCTIONS MAY RESULT IN THE CANCELLATION OF YOUR SURGERY PATIENT SIGNATURE_________________________________  NURSE SIGNATURE__________________________________  ________________________________________________________________________

## 2019-01-15 ENCOUNTER — Other Ambulatory Visit: Payer: Self-pay

## 2019-01-15 ENCOUNTER — Encounter (HOSPITAL_COMMUNITY): Payer: Self-pay

## 2019-01-15 ENCOUNTER — Encounter (HOSPITAL_COMMUNITY)
Admission: RE | Admit: 2019-01-15 | Discharge: 2019-01-15 | Disposition: A | Discharge status: Home / Self Care | Payer: 59 | Source: Ambulatory Visit | Attending: Urology | Admitting: Urology

## 2019-01-15 ENCOUNTER — Telehealth: Payer: Self-pay

## 2019-01-15 DIAGNOSIS — Z7902 Long term (current) use of antithrombotics/antiplatelets: Secondary | ICD-10-CM | POA: Insufficient documentation

## 2019-01-15 DIAGNOSIS — K219 Gastro-esophageal reflux disease without esophagitis: Secondary | ICD-10-CM | POA: Diagnosis not present

## 2019-01-15 DIAGNOSIS — I2510 Atherosclerotic heart disease of native coronary artery without angina pectoris: Secondary | ICD-10-CM | POA: Insufficient documentation

## 2019-01-15 DIAGNOSIS — I130 Hypertensive heart and chronic kidney disease with heart failure and stage 1 through stage 4 chronic kidney disease, or unspecified chronic kidney disease: Secondary | ICD-10-CM | POA: Insufficient documentation

## 2019-01-15 DIAGNOSIS — Z7982 Long term (current) use of aspirin: Secondary | ICD-10-CM | POA: Diagnosis not present

## 2019-01-15 DIAGNOSIS — N183 Chronic kidney disease, stage 3 (moderate): Secondary | ICD-10-CM | POA: Insufficient documentation

## 2019-01-15 DIAGNOSIS — I252 Old myocardial infarction: Secondary | ICD-10-CM | POA: Diagnosis not present

## 2019-01-15 DIAGNOSIS — E785 Hyperlipidemia, unspecified: Secondary | ICD-10-CM | POA: Diagnosis not present

## 2019-01-15 DIAGNOSIS — Z955 Presence of coronary angioplasty implant and graft: Secondary | ICD-10-CM | POA: Insufficient documentation

## 2019-01-15 DIAGNOSIS — Z791 Long term (current) use of non-steroidal anti-inflammatories (NSAID): Secondary | ICD-10-CM | POA: Insufficient documentation

## 2019-01-15 DIAGNOSIS — N400 Enlarged prostate without lower urinary tract symptoms: Secondary | ICD-10-CM | POA: Insufficient documentation

## 2019-01-15 DIAGNOSIS — E039 Hypothyroidism, unspecified: Secondary | ICD-10-CM | POA: Diagnosis not present

## 2019-01-15 DIAGNOSIS — Z79899 Other long term (current) drug therapy: Secondary | ICD-10-CM | POA: Insufficient documentation

## 2019-01-15 DIAGNOSIS — N201 Calculus of ureter: Secondary | ICD-10-CM | POA: Insufficient documentation

## 2019-01-15 DIAGNOSIS — Z8673 Personal history of transient ischemic attack (TIA), and cerebral infarction without residual deficits: Secondary | ICD-10-CM | POA: Insufficient documentation

## 2019-01-15 DIAGNOSIS — Z01812 Encounter for preprocedural laboratory examination: Secondary | ICD-10-CM | POA: Insufficient documentation

## 2019-01-15 DIAGNOSIS — Z87891 Personal history of nicotine dependence: Secondary | ICD-10-CM | POA: Insufficient documentation

## 2019-01-15 DIAGNOSIS — Z96611 Presence of right artificial shoulder joint: Secondary | ICD-10-CM | POA: Insufficient documentation

## 2019-01-15 DIAGNOSIS — I5042 Chronic combined systolic (congestive) and diastolic (congestive) heart failure: Secondary | ICD-10-CM | POA: Diagnosis not present

## 2019-01-15 DIAGNOSIS — Z87442 Personal history of urinary calculi: Secondary | ICD-10-CM | POA: Insufficient documentation

## 2019-01-15 HISTORY — DX: Personal history of urinary calculi: Z87.442

## 2019-01-15 LAB — BASIC METABOLIC PANEL
Anion gap: 14 (ref 5–15)
BUN: 15 mg/dL (ref 6–20)
CO2: 24 mmol/L (ref 22–32)
Calcium: 8.7 mg/dL — ABNORMAL LOW (ref 8.9–10.3)
Chloride: 106 mmol/L (ref 98–111)
Creatinine, Ser: 0.93 mg/dL (ref 0.61–1.24)
GFR calc Af Amer: >60 mL/min (ref >60)
GFR calc non Af Amer: >60 mL/min (ref >60)
Glucose, Bld: 116 mg/dL — ABNORMAL HIGH (ref 70–99)
Potassium: 3.5 mmol/L (ref 3.5–5.1)
Sodium: 144 mmol/L (ref 135–145)

## 2019-01-15 LAB — CBC
HCT: 37.5 % — ABNORMAL LOW (ref 39.0–52.0)
Hemoglobin: 12.5 g/dL — ABNORMAL LOW (ref 13.0–17.0)
MCH: 35.5 pg — ABNORMAL HIGH (ref 26.0–34.0)
MCHC: 33.3 g/dL (ref 30.0–36.0)
MCV: 106.5 fL — ABNORMAL HIGH (ref 80.0–100.0)
Platelets: 137 10*3/uL — ABNORMAL LOW (ref 150–400)
RBC: 3.52 MIL/uL — ABNORMAL LOW (ref 4.22–5.81)
RDW: 15.9 % — ABNORMAL HIGH (ref 11.5–15.5)
WBC: 5.8 10*3/uL (ref 4.0–10.5)
nRBC: 0 % (ref 0.0–0.2)

## 2019-01-15 LAB — NOVEL CORONAVIRUS, NAA (HOSP ORDER, SEND-OUT TO REF LAB; TAT 18-24 HRS): SARS-CoV-2, NAA: NOT DETECTED

## 2019-01-15 NOTE — Progress Notes (Signed)
Anesthesia Chart Review   Case: [id number] Date/Time: 01/16/19 1150   Procedure: CYSTOSCOPY LEFT URETEROSCOPY/HOLMIUM LASER/STENT PLACEMENT (Left )   Anesthesia type: General   Pre-op diagnosis: LEFT URETERAL STONE   Location: [facility] ROOM 07 / [facility]   Surgeon: Cleon Gustin, MD      DISCUSSION:57 y.o. former smoker (3 pack years, quit 11/15/16) with h/o HTN, GERD, hiatal hernia, hypothyroidism, BPH, CAD (NSTEMI, DES to mid circumflex 2018, DES to [facility] 05/2018), CHF, asthma, HLD, TIA, CKD Stage III, thrombocytopenia, left ureteral stone scheduled for above procedure 01/16/2019 with Dr. Nicolette Bang.    Pt cleared by cardiology 01/10/2019.  Per Kathyrn Drown, NP, "[name] was last seen on 12/04/2018 by Dr. Debara Pickett.  Since that day, [name] has done well from a cardiac perspective. He is now having issues with a kidney stone which is his biggest complaint in our discussion today. He denies chest pain or other anginal equivalents.  Per requesting office, there is no need to hold ASA and Plavix for his procedure which we would prefer given his recent cardiac stenting. Therefore, based on ACC/AHA guidelines, the patient would be at acceptable risk for the planned procedure without further cardiovascular testing."  Anticipate pt can proceed with planned procedure barring acute status change.    VS: BP (!) 149/92   Pulse (!) 102   Temp 36.9 C (Oral)   Resp 20   Ht 5\' 10"  ([id number] m)   Wt 104.7 kg   SpO2 97%   BMI 33.12 kg/m   PROVIDERS: [name], DO is PCP   Ena Dawley, MD is Cardiologist  LABS: Labs reviewed: Acceptable for surgery. (all labs ordered are listed, but only abnormal results are displayed)  Labs Reviewed  CBC  BASIC METABOLIC PANEL     IMAGES:   EKG: 12/03/2018 Rate 127 Sinus tachycardia Probable left atrial enlargement  Borderline repolarization abnormality   CV: Cardiac Cath 07/24/2018  Stable coronary anatomy when compared  to post PCI images performed in February 2020.  Short, widely patent left main.  Large LAD that wraps around the left ventricular apex.  Irregularities up to 30% proximal.  Widely patent circumflex with mid vessel 20 to 30% eccentric narrowing, and widely patent stent and the dominant obtuse marginal.  Small branch of the distal circumflex contains 70% narrowing and is stable over time.  Widely patent right coronary including wide patency of the mid to distal stent placed in February 2020.  Low normal LV systolic function with EF 50%.  Normal filling pressures.  RECOMMENDATIONS:   IV nitroglycerin was discontinued.  Consider alternative explanations for the patient's chest pain.   Echo 06/10/2018 IMPRESSIONS    1. The left ventricle has normal systolic function with an ejection fraction of 60-65%. The cavity size was normal. There is mildly increased left ventricular wall thickness. Left ventricular diastolic Doppler parameters are consistent with impaired  relaxation Indeterminent filling pressures The E/e' is 8-15.  2. The right ventricle has normal systolic function. The cavity was normal. There is no increase in right ventricular wall thickness.  3. The mitral valve is normal in structure.  4. The tricuspid valve is normal in structure.  5. The aortic valve is normal in structure.  6. The aortic root and ascending aorta are normal in size and structure.  7. The interatrial septum was not well visualized.  8. When compared to the prior study: Compared to a prior study in 01/2017, there are no new changes.  Myocardial Perfusion 06/05/2018 (subsequent stenting)  The left ventricular ejection fraction is moderately decreased (30-44%).  Nuclear stress EF: 43%.  Blood pressure demonstrated a hypertensive response to stress.  There was no ST segment deviation noted during stress.  Defect 1: There is a small defect of mild severity present in the apical inferior and apex  location.  Defect 2: There is a small defect of moderate severity present in the apical anterior and apex location.  Defect 3: There is a small defect of moderate severity present in the basal inferolateral location.  Findings consistent with ischemia and prior myocardial infarction with peri-infarct ischemia.  This is an intermediate risk study.   Ejection fraction is reduced with global hypokinesis.  There is a small moderate partially reversible defect in the anteroapex that goes to mild and small moderate partially reversible defect in the basal inferolateral wall that goes to mild.  The suggest possible peri-infarct ischemia.  There is a reversible mild defect in the inferoapex that returns to normal.  This suggests ischemia.  Prior exam noted in medical record documentation is not available for review.  Past Medical History:  Diagnosis Date  . BPH (benign prostatic hyperplasia)   . CAD (coronary artery disease)    a. NSTEMI troponin >65 with cath 11/2016 S/p PTCA & DES to mid circumflex. b. 05/2018 s/p DES to [facility].  Marland Kitchen Chronic chest pain   . Chronic combined systolic and diastolic CHF (congestive heart failure) ([place])    a. EF 45% in 2018. b. EF 55-60% by echo 01/2017. c. EF 48% by nuc 07/2017.  . CKD (chronic kidney disease), stage II   . Cluster headache    hx of  . Complication of anesthesia    difficult waking up"  . Diverticulosis   . Drug abuse ([place])    hx of  . GERD (gastroesophageal reflux disease)   . Hiatal hernia   . History of kidney stones   . Hyperlipidemia   . Hypertension   . Hypothyroidism   . Nontoxic multinodular goiter   . NSTEMI (non-ST elevated myocardial infarction) ([place]) 2018  . Perforated nasal septum   . Pulmonary nodule --- CT 10/19/2014: Nodule is stable, no further routine x-rays 01/06/2009  . Thrombocytopenia ([place])   . TIA (transient ischemic attack)   . Trigeminal neuralgia   . Tubular adenoma of colon 12/2015  . Unspecified asthma(493.90)      Past Surgical History:  Procedure Laterality Date  . APPENDECTOMY    . CARDIAC CATHETERIZATION N/A 11/25/2014   Procedure: Left Heart Cath and Coronary Angiography;  Surgeon: Belva Crome, MD;  Location: [place] [facility];  Service: Cardiovascular;  Laterality: N/A;  . CARDIAC CATHETERIZATION  12/08/2008   Archie Endo 08/24/2010  . CHOLECYSTECTOMY N/A 12/09/2018   Procedure: LAPAROSCOPIC CHOLECYSTECTOMY WITH INTRAOPERATIVE CHOLANGIOGRAM;  Surgeon: Erroll Luna, MD;  Location: [place];  Service: General;  Laterality: N/A;  . COLECTOMY  ~ 1976   "I had a blockage"  . CORONARY ANGIOPLASTY  2018,2020   stents placed  . CORONARY STENT INTERVENTION N/A 11/27/2016   Procedure: CORONARY STENT INTERVENTION;  Surgeon: Belva Crome, MD;  Location: [place] [facility];  Service: Cardiovascular;  Laterality: N/A;  . CORONARY STENT INTERVENTION N/A 06/10/2018   Procedure: CORONARY STENT INTERVENTION;  Surgeon: Nelva Bush, MD;  Location: [place] [facility];  Service: Cardiovascular;  Laterality: N/A;  . CORONARY STENT PLACEMENT  11/27/2016   STENT XIENCE ALPINE RX 3.0X15 drug eluting stent was successfully  placed  . CYST EXCISION Left 10/31/2016   Jaw  . DESTRUCTION TRIGEMINAL NERVE VIA NEUROLYTIC AGENT  x 3 , R side last ~2010  . KNEE ARTHROSCOPY Right 06/26/2000   Arthroscopy followed by open lateral release/notes 09/06/2010  . LEFT HEART CATH AND CORONARY ANGIOGRAPHY N/A 11/27/2016   Procedure: LEFT HEART CATH AND CORONARY ANGIOGRAPHY;  Surgeon: Belva Crome, MD;  Location: [place] [facility];  Service: Cardiovascular;  Laterality: N/A;  . LEFT HEART CATH AND CORONARY ANGIOGRAPHY N/A 12/07/2016   Procedure: LEFT HEART CATH AND CORONARY ANGIOGRAPHY;  Surgeon: Nelva Bush, MD;  Location: [place] [facility];  Service: Cardiovascular;  Laterality: N/A;  . LEFT HEART CATH AND CORONARY ANGIOGRAPHY N/A 06/10/2018   Procedure: LEFT HEART CATH AND CORONARY ANGIOGRAPHY;  Surgeon: Nelva Bush, MD;   Location: [place] [facility];  Service: Cardiovascular;  Laterality: N/A;  . LEFT HEART CATH AND CORONARY ANGIOGRAPHY N/A 07/24/2018   Procedure: LEFT HEART CATH AND CORONARY ANGIOGRAPHY;  Surgeon: Belva Crome, MD;  Location: [place] [facility];  Service: Cardiovascular;  Laterality: N/A;  . 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 Ardine Bjork 09/06/2010  . SHOULDER HEMI-ARTHROPLASTY Right 05/26/2010   revision/notes 05/27/2010    MEDICATIONS: . amLODipine (NORVASC) 10 MG tablet  . aspirin EC 81 MG tablet  . calcium carbonate (TUMS EX) 750 MG chewable tablet  . clopidogrel (PLAVIX) 75 MG tablet  . Evolocumab (REPATHA SURECLICK) [id number] MG/ML SOAJ  . ezetimibe (ZETIA) 10 MG tablet  . fluticasone (FLONASE) 50 MCG/ACT nasal spray  . gabapentin (NEURONTIN) 300 MG capsule  . ibuprofen (ADVIL) 200 MG tablet  . isosorbide mononitrate (IMDUR) 30 MG 24 hr tablet  . ketoconazole (NIZORAL) 2 % cream  . lansoprazole (PREVACID) 30 MG capsule  . levocetirizine (XYZAL) 5 MG tablet  . losartan (COZAAR) 100 MG tablet  . metoprolol succinate (TOPROL-XL) 50 MG 24 hr tablet  . nitroGLYCERIN (NITROSTAT) 0.4 MG SL tablet  . oxyCODONE (OXY IR/ROXICODONE) 5 MG immediate release tablet  . oxyCODONE-acetaminophen (PERCOCET/ROXICET) 5-325 MG tablet  . Polyethyl Glycol-Propyl Glycol (SYSTANE OP)  . promethazine (PHENERGAN) 12.5 MG tablet  . sulfamethoxazole-trimethoprim (BACTRIM DS) 800-160 MG tablet  . tamsulosin (FLOMAX) 0.4 MG CAPS capsule  . Venlafaxine HCl 75 MG TB24   No current facility-administered medications for this encounter.     Maia Plan [facility] Pre-Surgical Testing [phone] 01/15/19 12:05 PM

## 2019-01-15 NOTE — Anesthesia Preprocedure Evaluation (Addendum)
Anesthesia Evaluation  Patient identified by MRN, date of birth, ID band Patient awake    Reviewed: Allergy & Precautions, H&P , NPO status , Patient's Chart, lab work & pertinent test results  Airway Mallampati: II   Neck ROM: full    Dental   Pulmonary asthma , former smoker,    breath sounds clear to auscultation       Cardiovascular hypertension, + angina + CAD, + Past MI, + Cardiac Stents and +CHF   Rhythm:regular Rate:Normal     Neuro/Psych  Headaches, PSYCHIATRIC DISORDERS Anxiety Depression TIA Neuromuscular disease    GI/Hepatic hiatal hernia, GERD  ,  Endo/Other  Hypothyroidism   Renal/GU      Musculoskeletal   Abdominal   Peds  Hematology   Anesthesia Other Findings   Reproductive/Obstetrics                            Anesthesia Physical Anesthesia Plan  ASA: III  Anesthesia Plan: General   Post-op Pain Management:    Induction: Intravenous  PONV Risk Score and Plan: 2 and Ondansetron, Dexamethasone, Midazolam and Treatment may vary due to age or medical condition  Airway Management Planned: LMA  Additional Equipment:   Intra-op Plan:   Post-operative Plan:   Informed Consent: I have reviewed the patients History and Physical, chart, labs and discussed the procedure including the risks, benefits and alternatives for the proposed anesthesia with the patient or authorized representative who has indicated his/her understanding and acceptance.       Plan Discussed with: CRNA, Anesthesiologist and Surgeon  Anesthesia Plan Comments: (See PAT note 01/15/2019, Konrad Felix, PA-C)       Anesthesia Quick Evaluation

## 2019-01-15 NOTE — Progress Notes (Signed)
PCP - Dr. Nani Ravens [facility] Cardiologist -Dr. Ena Dawley  LOV-11/15/2018  Cardiac Clearance 01/10/2019 by Kathyrn Drown, NP  Chest x-ray - 12/02/2018  I view EKG - 12/03/2018 Stress Test - N/A ECHO - 06/10/2018 Cardiac Cath - 07/24/2018  Sleep Study - 2001 or 2002 for cluster headaches as interfered with sleep pattern CPAP - N/A  Fasting Blood Sugar - N/A Checks Blood Sugar _____ times a day  Blood Thinner Instructions:Plavix and Aspirin 81 mg  Per note from Kathyrn Drown 01/10/2019 patient to stay on Aspirin and Plavix for surgery  Aspirin Instructions: Last Dose:  Anesthesia review:  Chart given to Iver Nestle, PA to review  Patient denies shortness of breath, fever, cough and chest pain at PAT appointment  Patient Short of breath upon arrival to PST appointment . Patient states he did not take any of his morning medications this morning as he was rushed. Iver Nestle, PA came into PST pre-op room and spoke to patient and examined patient.  Patient verbalized understanding of instructions that were given to them at the PAT appointment. Patient was also instructed that they will need to review over the PAT instructions again at home before surgery.

## 2019-01-15 NOTE — Telephone Encounter (Signed)
Pt advised.

## 2019-01-16 ENCOUNTER — Ambulatory Visit (HOSPITAL_COMMUNITY): Payer: 59

## 2019-01-16 ENCOUNTER — Encounter (HOSPITAL_COMMUNITY): Payer: Self-pay | Admitting: *Deleted

## 2019-01-16 ENCOUNTER — Encounter (HOSPITAL_COMMUNITY)
Admission: RE | Disposition: A | Discharge status: Home / Self Care | Payer: Self-pay | Source: Home / Self Care | Attending: Urology

## 2019-01-16 ENCOUNTER — Ambulatory Visit (HOSPITAL_COMMUNITY)
Admission: RE | Admit: 2019-01-16 | Discharge: 2019-01-16 | Disposition: A | Discharge status: Home / Self Care | Payer: 59 | Attending: Urology | Admitting: Urology

## 2019-01-16 ENCOUNTER — Ambulatory Visit (HOSPITAL_COMMUNITY): Payer: 59 | Admitting: Certified Registered"

## 2019-01-16 DIAGNOSIS — I130 Hypertensive heart and chronic kidney disease with heart failure and stage 1 through stage 4 chronic kidney disease, or unspecified chronic kidney disease: Secondary | ICD-10-CM | POA: Insufficient documentation

## 2019-01-16 DIAGNOSIS — Z96611 Presence of right artificial shoulder joint: Secondary | ICD-10-CM | POA: Diagnosis not present

## 2019-01-16 DIAGNOSIS — N132 Hydronephrosis with renal and ureteral calculous obstruction: Secondary | ICD-10-CM | POA: Diagnosis not present

## 2019-01-16 DIAGNOSIS — Z87891 Personal history of nicotine dependence: Secondary | ICD-10-CM | POA: Insufficient documentation

## 2019-01-16 DIAGNOSIS — K449 Diaphragmatic hernia without obstruction or gangrene: Secondary | ICD-10-CM | POA: Insufficient documentation

## 2019-01-16 DIAGNOSIS — Z885 Allergy status to narcotic agent status: Secondary | ICD-10-CM | POA: Insufficient documentation

## 2019-01-16 DIAGNOSIS — I2510 Atherosclerotic heart disease of native coronary artery without angina pectoris: Secondary | ICD-10-CM | POA: Insufficient documentation

## 2019-01-16 DIAGNOSIS — E785 Hyperlipidemia, unspecified: Secondary | ICD-10-CM | POA: Insufficient documentation

## 2019-01-16 DIAGNOSIS — Z886 Allergy status to analgesic agent status: Secondary | ICD-10-CM | POA: Diagnosis not present

## 2019-01-16 DIAGNOSIS — I252 Old myocardial infarction: Secondary | ICD-10-CM | POA: Insufficient documentation

## 2019-01-16 DIAGNOSIS — Z955 Presence of coronary angioplasty implant and graft: Secondary | ICD-10-CM | POA: Diagnosis not present

## 2019-01-16 DIAGNOSIS — G500 Trigeminal neuralgia: Secondary | ICD-10-CM | POA: Insufficient documentation

## 2019-01-16 DIAGNOSIS — Z809 Family history of malignant neoplasm, unspecified: Secondary | ICD-10-CM | POA: Insufficient documentation

## 2019-01-16 DIAGNOSIS — D696 Thrombocytopenia, unspecified: Secondary | ICD-10-CM | POA: Insufficient documentation

## 2019-01-16 DIAGNOSIS — Z86010 Personal history of colonic polyps: Secondary | ICD-10-CM | POA: Insufficient documentation

## 2019-01-16 DIAGNOSIS — Z9049 Acquired absence of other specified parts of digestive tract: Secondary | ICD-10-CM | POA: Insufficient documentation

## 2019-01-16 DIAGNOSIS — Z8249 Family history of ischemic heart disease and other diseases of the circulatory system: Secondary | ICD-10-CM | POA: Insufficient documentation

## 2019-01-16 DIAGNOSIS — N182 Chronic kidney disease, stage 2 (mild): Secondary | ICD-10-CM | POA: Diagnosis not present

## 2019-01-16 DIAGNOSIS — K219 Gastro-esophageal reflux disease without esophagitis: Secondary | ICD-10-CM | POA: Insufficient documentation

## 2019-01-16 DIAGNOSIS — E039 Hypothyroidism, unspecified: Secondary | ICD-10-CM | POA: Insufficient documentation

## 2019-01-16 DIAGNOSIS — Z79899 Other long term (current) drug therapy: Secondary | ICD-10-CM | POA: Diagnosis not present

## 2019-01-16 DIAGNOSIS — N400 Enlarged prostate without lower urinary tract symptoms: Secondary | ICD-10-CM | POA: Diagnosis not present

## 2019-01-16 DIAGNOSIS — F329 Major depressive disorder, single episode, unspecified: Secondary | ICD-10-CM | POA: Insufficient documentation

## 2019-01-16 DIAGNOSIS — Z87442 Personal history of urinary calculi: Secondary | ICD-10-CM | POA: Diagnosis not present

## 2019-01-16 DIAGNOSIS — G709 Myoneural disorder, unspecified: Secondary | ICD-10-CM | POA: Insufficient documentation

## 2019-01-16 DIAGNOSIS — J45909 Unspecified asthma, uncomplicated: Secondary | ICD-10-CM | POA: Insufficient documentation

## 2019-01-16 DIAGNOSIS — I5042 Chronic combined systolic (congestive) and diastolic (congestive) heart failure: Secondary | ICD-10-CM | POA: Insufficient documentation

## 2019-01-16 DIAGNOSIS — Z833 Family history of diabetes mellitus: Secondary | ICD-10-CM | POA: Insufficient documentation

## 2019-01-16 DIAGNOSIS — Z8673 Personal history of transient ischemic attack (TIA), and cerebral infarction without residual deficits: Secondary | ICD-10-CM | POA: Insufficient documentation

## 2019-01-16 DIAGNOSIS — N200 Calculus of kidney: Secondary | ICD-10-CM

## 2019-01-16 DIAGNOSIS — F419 Anxiety disorder, unspecified: Secondary | ICD-10-CM | POA: Insufficient documentation

## 2019-01-16 HISTORY — PX: CYSTOSCOPY/URETEROSCOPY/HOLMIUM LASER/STENT PLACEMENT: SHX6546

## 2019-01-16 SURGERY — CYSTOSCOPY/URETEROSCOPY/HOLMIUM LASER/STENT PLACEMENT
Anesthesia: General | Laterality: Left

## 2019-01-16 MED ORDER — OXYCODONE HCL 5 MG/5ML PO SOLN: 5.0000 mg | Freq: Once | ORAL | Status: DC | PRN

## 2019-01-16 MED ORDER — MIDAZOLAM HCL 5 MG/5ML IJ SOLN
INTRAMUSCULAR | Status: DC | PRN
  Administered 2019-01-16: 2 mg via INTRAVENOUS

## 2019-01-16 MED ORDER — DEXAMETHASONE SODIUM PHOSPHATE 10 MG/ML IJ SOLN
INTRAMUSCULAR | Status: DC | PRN
  Administered 2019-01-16: 10 mg via INTRAVENOUS

## 2019-01-16 MED ORDER — FENTANYL CITRATE (PF) 100 MCG/2ML IJ SOLN
INTRAMUSCULAR | Status: AC
  Filled 2019-01-16: qty 2

## 2019-01-16 MED ORDER — LACTATED RINGERS IV SOLN
INTRAVENOUS | Status: DC
  Administered 2019-01-16 (×2): via INTRAVENOUS

## 2019-01-16 MED ORDER — FENTANYL CITRATE (PF) 100 MCG/2ML IJ SOLN: 25.0000 ug | INTRAMUSCULAR | Status: DC | PRN

## 2019-01-16 MED ORDER — PROPOFOL 10 MG/ML IV BOLUS
INTRAVENOUS | Status: DC | PRN
  Administered 2019-01-16: 200 mg via INTRAVENOUS

## 2019-01-16 MED ORDER — MIDAZOLAM HCL 2 MG/2ML IJ SOLN
INTRAMUSCULAR | Status: AC
  Filled 2019-01-16: qty 2

## 2019-01-16 MED ORDER — IOHEXOL 300 MG/ML  SOLN
INTRAMUSCULAR | Status: DC | PRN
  Administered 2019-01-16: 3 mL via URETHRAL

## 2019-01-16 MED ORDER — BELLADONNA ALKALOIDS-OPIUM 16.2-30 MG RE SUPP
RECTAL | Status: AC
  Filled 2019-01-16: qty 1

## 2019-01-16 MED ORDER — OXYCODONE-ACETAMINOPHEN 5-325 MG PO TABS: 1.0000 | ORAL_TABLET | Freq: Every evening | ORAL | 0 refills | Status: DC | PRN

## 2019-01-16 MED ORDER — OXYCODONE HCL 5 MG PO TABS: 5.0000 mg | ORAL_TABLET | Freq: Once | ORAL | Status: DC | PRN

## 2019-01-16 MED ORDER — SODIUM CHLORIDE 0.9 % IR SOLN
Status: DC | PRN
  Administered 2019-01-16: 3000 mL

## 2019-01-16 MED ORDER — ONDANSETRON HCL 4 MG/2ML IJ SOLN
INTRAMUSCULAR | Status: DC | PRN
  Administered 2019-01-16: 4 mg via INTRAVENOUS

## 2019-01-16 MED ORDER — LIDOCAINE 2% (20 MG/ML) 5 ML SYRINGE
INTRAMUSCULAR | Status: DC | PRN
  Administered 2019-01-16: 60 mg via INTRAVENOUS

## 2019-01-16 MED ORDER — LIDOCAINE HCL URETHRAL/MUCOSAL 2 % EX GEL
CUTANEOUS | Status: AC
  Filled 2019-01-16: qty 5

## 2019-01-16 MED ORDER — CEFAZOLIN SODIUM-DEXTROSE 2-4 GM/100ML-% IV SOLN
2.0000 g | INTRAVENOUS | Status: AC
  Administered 2019-01-16: 2 g via INTRAVENOUS
  Filled 2019-01-16: qty 100

## 2019-01-16 MED ORDER — FENTANYL CITRATE (PF) 100 MCG/2ML IJ SOLN
INTRAMUSCULAR | Status: DC | PRN
  Administered 2019-01-16 (×3): 50 ug via INTRAVENOUS
  Administered 2019-01-16 (×2): 25 ug via INTRAVENOUS

## 2019-01-16 MED ORDER — ONDANSETRON HCL 4 MG/2ML IJ SOLN: 4.0000 mg | Freq: Four times a day (QID) | INTRAMUSCULAR | Status: DC | PRN

## 2019-01-16 MED ORDER — PHENYLEPHRINE 40 MCG/ML (10ML) SYRINGE FOR IV PUSH (FOR BLOOD PRESSURE SUPPORT)
PREFILLED_SYRINGE | INTRAVENOUS | Status: DC | PRN
  Administered 2019-01-16 (×2): 80 ug via INTRAVENOUS
  Administered 2019-01-16: 120 ug via INTRAVENOUS
  Administered 2019-01-16: 80 ug via INTRAVENOUS

## 2019-01-16 SURGICAL SUPPLY — 20 items
BAG URO CATCHER STRL LF (MISCELLANEOUS) ×2 IMPLANT
CATH INTERMIT  6FR 70CM (CATHETERS) ×2 IMPLANT
CLOTH BEACON ORANGE TIMEOUT ST (SAFETY) ×2 IMPLANT
COVER WAND RF STERILE (DRAPES) IMPLANT
EXTRACTOR STONE NITINOL NGAGE (UROLOGICAL SUPPLIES) ×2 IMPLANT
FIBER LASER TRAC TIP (UROLOGICAL SUPPLIES) ×2 IMPLANT
GLOVE BIO SURGEON STRL SZ8 (GLOVE) ×2 IMPLANT
GOWN STRL REUS W/TWL XL LVL3 (GOWN DISPOSABLE) ×2 IMPLANT
GUIDEWIRE ANG ZIPWIRE 038X150 (WIRE) ×2 IMPLANT
GUIDEWIRE STR DUAL SENSOR (WIRE) ×2 IMPLANT
IV NS 1000ML (IV SOLUTION) ×1
IV NS 1000ML BAXH (IV SOLUTION) ×1 IMPLANT
KIT TURNOVER KIT A (KITS) IMPLANT
MANIFOLD NEPTUNE II (INSTRUMENTS) ×2 IMPLANT
PACK CYSTO (CUSTOM PROCEDURE TRAY) ×2 IMPLANT
SHEATH URETERAL 12FRX35CM (MISCELLANEOUS) IMPLANT
STENT URET 6FRX26 CONTOUR (STENTS) ×2 IMPLANT
TUBE FEEDING 8FR 16IN STR KANG (MISCELLANEOUS) ×2 IMPLANT
TUBING CONNECTING 10 (TUBING) ×2 IMPLANT
TUBING UROLOGY SET (TUBING) ×2 IMPLANT

## 2019-01-16 NOTE — H&P (Signed)
Urology Admission H&P  Chief Complaint: left flank pain  History of Present Illness: Mr Freehill is a 58yo with a hx of gross hematuria who was found to have a 94mm left distal ureteral calculus. He has severe sharp, intermittent nonraditing left flank pain which is improved with narcotic pain medication. No LUTS.  Past Medical History:  Diagnosis Date  . BPH (benign prostatic hyperplasia)   . CAD (coronary artery disease)    a. NSTEMI troponin >65 with cath 11/2016 S/p PTCA & DES to mid circumflex. b. 05/2018 s/p DES to [facility].  Marland Kitchen Chronic chest pain   . Chronic combined systolic and diastolic CHF (congestive heart failure) ([place])    a. EF 45% in 2018. b. EF 55-60% by echo 01/2017. c. EF 48% by nuc 07/2017.  . CKD (chronic kidney disease), stage II   . Cluster headache    hx of  . Complication of anesthesia    difficult waking up"  . Diverticulosis   . Drug abuse ([place])    hx of  . GERD (gastroesophageal reflux disease)   . Hiatal hernia   . History of kidney stones   . Hyperlipidemia   . Hypertension   . Hypothyroidism   . Nontoxic multinodular goiter   . NSTEMI (non-ST elevated myocardial infarction) ([place]) 2018  . Perforated nasal septum   . Pulmonary nodule --- CT 10/19/2014: Nodule is stable, no further routine x-rays 01/06/2009  . Thrombocytopenia ([place])   . TIA (transient ischemic attack)   . Trigeminal neuralgia   . Tubular adenoma of colon 12/2015  . Unspecified asthma(493.90)    Past Surgical History:  Procedure Laterality Date  . APPENDECTOMY    . CARDIAC CATHETERIZATION N/A 11/25/2014   Procedure: Left Heart Cath and Coronary Angiography;  Surgeon: Belva Crome, MD;  Location: [place] [facility];  Service: Cardiovascular;  Laterality: N/A;  . CARDIAC CATHETERIZATION  12/08/2008   Archie Endo 08/24/2010  . CHOLECYSTECTOMY N/A 12/09/2018   Procedure: LAPAROSCOPIC CHOLECYSTECTOMY WITH INTRAOPERATIVE CHOLANGIOGRAM;  Surgeon: Erroll Luna, MD;  Location: [place];  Service: General;   Laterality: N/A;  . COLECTOMY  ~ 1976   "I had a blockage"  . CORONARY ANGIOPLASTY  2018,2020   stents placed  . CORONARY STENT INTERVENTION N/A 11/27/2016   Procedure: CORONARY STENT INTERVENTION;  Surgeon: Belva Crome, MD;  Location: [place] [facility];  Service: Cardiovascular;  Laterality: N/A;  . CORONARY STENT INTERVENTION N/A 06/10/2018   Procedure: CORONARY STENT INTERVENTION;  Surgeon: Nelva Bush, MD;  Location: [place] [facility];  Service: Cardiovascular;  Laterality: N/A;  . CORONARY STENT PLACEMENT  11/27/2016   STENT XIENCE ALPINE RX 3.0X15 drug eluting stent was successfully placed  . CYST EXCISION Left 10/31/2016   Jaw  . DESTRUCTION TRIGEMINAL NERVE VIA NEUROLYTIC AGENT  x 3 , R side last ~2010  . KNEE ARTHROSCOPY Right 06/26/2000   Arthroscopy followed by open lateral release/notes 09/06/2010  . LEFT HEART CATH AND CORONARY ANGIOGRAPHY N/A 11/27/2016   Procedure: LEFT HEART CATH AND CORONARY ANGIOGRAPHY;  Surgeon: Belva Crome, MD;  Location: [place] [facility];  Service: Cardiovascular;  Laterality: N/A;  . LEFT HEART CATH AND CORONARY ANGIOGRAPHY N/A 12/07/2016   Procedure: LEFT HEART CATH AND CORONARY ANGIOGRAPHY;  Surgeon: Nelva Bush, MD;  Location: [place] [facility];  Service: Cardiovascular;  Laterality: N/A;  . LEFT HEART CATH AND CORONARY ANGIOGRAPHY N/A 06/10/2018   Procedure: LEFT HEART CATH AND CORONARY ANGIOGRAPHY;  Surgeon: Nelva Bush, MD;  Location: [place] [facility];  Service: Cardiovascular;  Laterality: N/A;  . LEFT HEART CATH AND CORONARY ANGIOGRAPHY N/A 07/24/2018   Procedure: LEFT HEART CATH AND CORONARY ANGIOGRAPHY;  Surgeon: Belva Crome, MD;  Location: [place] [facility];  Service: Cardiovascular;  Laterality: N/A;  . 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 Ardine Bjork 09/06/2010  . SHOULDER HEMI-ARTHROPLASTY Right 05/26/2010   revision/notes 05/27/2010    Home Medications:  Current Facility-Administered  Medications  Medication Dose Route Frequency Provider Last Rate Last Dose  . ceFAZolin (ANCEF) IVPB 2g/100 mL premix  2 g Intravenous 30 min Pre-Op Cleon Gustin, MD      . lactated ringers infusion   Intravenous Continuous Barnet Glasgow, MD 100 mL/hr at 01/16/19 1033     Allergies:  Allergies  Allergen Reactions  . Morphine And Related Nausea And Vomiting  . Tylenol [Acetaminophen] Nausea And Vomiting  . Tramadol Palpitations    Family History  Problem Relation Age of Onset  . Diabetes Mother   . Hyperlipidemia Mother   . Hypertension Mother   . Hypertension Father   . Heart disease Father   . Cancer Father   . Sudden death Father        OCT Aug 11, 2009  . CAD Father   . Heart failure Father   . Mesothelioma Father   . Heart disease Maternal Aunt   . Prostate cancer Neg Hx   . Colon cancer Neg Hx    Social History:  reports that he quit smoking about 2 years ago. His smoking use included cigarettes. He has a 3.00 pack-year smoking history. He has never used smokeless tobacco. He reports current alcohol use of about 1.0 standard drinks of alcohol per week. He reports that he does not use drugs.  Review of Systems  Genitourinary: Positive for frequency and urgency.  All other systems reviewed and are negative.   Physical Exam:  Vital signs in last 24 hours: Temp:  [98.3 F (36.8 C)] 98.3 F (36.8 C) (09/24 1008) Pulse Rate:  [90] 90 (09/24 1008) Resp:  [18] 18 (09/24 1008) BP: (148)/(102) 148/102 (09/24 1008) SpO2:  [94 %] 94 % (09/24 1008) Physical Exam  Constitutional: He is oriented to person, place, and time. He appears well-developed and well-nourished.  HENT:  Head: Normocephalic and atraumatic.  Eyes: Pupils are equal, round, and reactive to light. EOM are normal.  Neck: Normal range of motion. No thyromegaly present.  Cardiovascular: Normal rate and regular rhythm.  Respiratory: Effort normal. No respiratory distress.  GI: Soft. He exhibits no  distension.  Musculoskeletal: Normal range of motion.        General: No edema.  Neurological: He is alert and oriented to person, place, and time.  Skin: Skin is warm and dry.  Psychiatric: He has a normal mood and affect. His behavior is normal. Judgment and thought content normal.    Laboratory Data:  No results found for this or any previous visit (from the past 24 hour(s)). Recent Results (from the past 240 hour(s))  Blood culture (routine single)     Status: None   Collection Time: 01/07/19  2:39 PM   Specimen: Blood  Result Value Ref Range Status   MICRO NUMBER: [id number]  Final   SPECIMEN QUALITY: Suboptimal  Final   Source LEFT HAND  Final   STATUS: FINAL  Final   Result:   Final    No growth after 5 days Visual inspection of blood culture bottles  indicates that an excessive volume of blood may have been collected. Detection of sepsis may be compromised.   COMMENT: Aerobic and anaerobic bottle received.  Final  Novel Coronavirus, NAA (Hosp order, Send-out to Ref Lab; TAT 18-24 hrs     Status: None   Collection Time: 01/13/19 11:23 AM   Specimen: Nasopharyngeal Swab; Respiratory  Result Value Ref Range Status   SARS-CoV-2, NAA NOT DETECTED NOT DETECTED Final    Comment: (NOTE) This nucleic acid amplification test was developed and its performance characteristics determined by [employer]. Nucleic acid amplification tests include PCR and TMA. This test has not been FDA cleared or approved. This test has been authorized by FDA under an Emergency Use Authorization (EUA). This test is only authorized for the duration of time the declaration that circumstances exist justifying the authorization of the emergency use of in vitro diagnostic tests for detection of SARS-CoV-2 virus and/or diagnosis of COVID-19 infection under section 564(b)(1) of the Act, 21 U.S.C. [id number]) (1), unless the authorization is terminated or revoked sooner. When diagnostic testing is  negative, the possibility of a false negative result should be considered in the context of a patient's recent exposures and the presence of clinical signs and symptoms consistent with COVID-19. An individual without symptoms of COVID- 19 and who is not shedding SARS-CoV-2 vi rus would expect to have a negative (not detected) result in this assay. Performed At: [facility] [address] [place], [place] [id number] Rush Farmer MD [id number]    [place]  Final    Comment: Performed at [facility], [place] [address] [place], [place] [address]   Creatinine: Recent Labs    01/15/19 1143  CREATININE 0.93   Baseline Creatinine: 0.9  Impression/Assessment:  57yo with left ureteral calculus  Plan:  The risks/benefits/alternatives to left ureteroscopic stone extraction was explained to the patient and he understands and wishes to proceed with surgery  Nicolette Bang 01/16/2019, 12:12 PM

## 2019-01-16 NOTE — Transfer of Care (Signed)
Immediate Anesthesia Transfer of Care Note  Patient: [name]  Procedure(s) Performed: CYSTOSCOPY LEFT URETEROSCOPY/HOLMIUM LASER/STENT PLACEMENT (Left )  Patient Location: PACU  Anesthesia Type:General  Level of Consciousness: awake, oriented and patient cooperative  Airway & Oxygen Therapy: Patient Spontanous Breathing and Patient connected to face mask oxygen  Post-op Assessment: Report given to RN and Post -op Vital signs reviewed and stable  Post vital signs: Reviewed and stable  Last Vitals:  Vitals Value Taken Time  BP 180/103 01/16/19 1331  Temp    Pulse 84 01/16/19 1332  Resp 17 01/16/19 1332  SpO2 95 % 01/16/19 1332  Vitals shown include unvalidated device data.  Last Pain:  Vitals:   01/16/19 1027  TempSrc:   PainSc: 0-No pain         Complications: No apparent anesthesia complications

## 2019-01-16 NOTE — Discharge Instructions (Signed)

## 2019-01-16 NOTE — Anesthesia Procedure Notes (Signed)
Procedure Name: LMA Insertion Date/Time: 01/16/2019 12:31 PM Performed by: Silas Sacramento, CRNA Pre-anesthesia Checklist: Patient identified, Emergency Drugs available, Suction available and Patient being monitored Patient Re-evaluated:Patient Re-evaluated prior to induction Oxygen Delivery Method: Circle system utilized Preoxygenation: Pre-oxygenation with 100% oxygen Induction Type: IV induction Ventilation: Mask ventilation without difficulty LMA: LMA with gastric port inserted LMA Size: 4.0 Tube type: Oral Number of attempts: 1 Airway Equipment and Method: Stylet and Oral airway Placement Confirmation: ETT inserted through vocal cords under direct vision,  positive ETCO2 and breath sounds checked- equal and bilateral Tube secured with: Tape Dental Injury: Teeth and Oropharynx as per pre-operative assessment

## 2019-01-16 NOTE — Op Note (Signed)
.  Preoperative diagnosis: Left ureteral stone  Postoperative diagnosis: Same  Procedure: 1 cystoscopy 2. Left retrograde pyelography 3.  Intraoperative fluoroscopy, under one hour, with interpretation 4.  Left ureteroscopic stone manipulation with laser lithotripsy 5.  Left 6 x 26 JJ stent placement  Attending: Rosie Fate  Anesthesia: General  Estimated blood loss: None  Drains: Left 6 x 26 JJ ureteral stent without tether  Specimens: stone for analysis  Antibiotics: ancef  Findings: left distal ureteral stone. Moderate hydronephrosis. No masses/lesions in the bladder. Ureteral orifices in normal anatomic location.  Indications: Patient is a 58 year old male with a history of left ureteral stone and who has persistent left flank pain.  After discussing treatment options, he decided proceed with left ureteroscopic stone manipulation.  Procedure her in detail: The patient was brought to the operating room and a brief timeout was done to ensure correct patient, correct procedure, correct site.  General anesthesia was administered patient was placed in dorsal lithotomy position.  Her genitalia was then prepped and draped in usual sterile fashion.  A rigid 53 French cystoscope was passed in the urethra and the bladder.  Bladder was inspected free masses or lesions.  the ureteral orifices were in the normal orthotopic locations.  a 6 french ureteral catheter was then instilled into the left ureteral orifice.  a gentle retrograde was obtained and findings noted above.  we then placed a zip wire through the ureteral catheter and advanced up to the renal pelvis.  we then removed the cystoscope and cannulated the left ureteral orifice with a semirigid ureteroscope. We located the calculus in the distal ureter. Using a 200nm laser fiber the stone was fragmented and the fragments were removed with an NGage basket. once all stone fragments were removed we then placed a 6 x 26 double-j ureteral  stent over the original zip wire. We then removed the wire and good coil was noted in the the renal pelvis under fluoroscopy and the bladder under direct vision.  the stone fragments were then removed from the bladder and sent for analysis.   the bladder was then drained and this concluded the procedure which was well tolerated by patient.  Complications: None  Condition: Stable, extubated, transferred to PACU  Plan: Patient is to be discharged home as to follow-up in one week for stent removal.

## 2019-01-17 ENCOUNTER — Encounter (HOSPITAL_COMMUNITY): Payer: Self-pay | Admitting: Urology

## 2019-01-17 NOTE — Anesthesia Postprocedure Evaluation (Signed)
Anesthesia Post Note  Patient: [name]  Procedure(s) Performed: CYSTOSCOPY LEFT URETEROSCOPY/HOLMIUM LASER/STENT PLACEMENT (Left )     Patient location during evaluation: PACU Anesthesia Type: General Level of consciousness: awake and alert Pain management: pain level controlled Vital Signs Assessment: post-procedure vital signs reviewed and stable Respiratory status: spontaneous breathing, nonlabored ventilation, respiratory function stable and patient connected to nasal cannula oxygen Cardiovascular status: blood pressure returned to baseline and stable Postop Assessment: no apparent nausea or vomiting Anesthetic complications: no    Last Vitals:  Vitals:   01/16/19 1400 01/16/19 1440  BP: (!) 158/97 (!) 154/100  Pulse: 80 80  Resp: 13 18  Temp: 36.9 C 36.8 C  SpO2: 93% 94%    Last Pain:  Vitals:   01/17/19 1522  TempSrc:   PainSc: [place]

## 2019-01-20 ENCOUNTER — Encounter: Payer: Self-pay | Admitting: Neurology

## 2019-01-28 ENCOUNTER — Ambulatory Visit (INDEPENDENT_AMBULATORY_CARE_PROVIDER_SITE_OTHER): Payer: 59 | Admitting: Neurology

## 2019-01-28 ENCOUNTER — Other Ambulatory Visit: Payer: Self-pay

## 2019-01-28 ENCOUNTER — Other Ambulatory Visit: Payer: 59

## 2019-01-28 DIAGNOSIS — R202 Paresthesia of skin: Secondary | ICD-10-CM | POA: Diagnosis not present

## 2019-01-28 DIAGNOSIS — E785 Hyperlipidemia, unspecified: Secondary | ICD-10-CM

## 2019-01-28 DIAGNOSIS — K760 Fatty (change of) liver, not elsewhere classified: Secondary | ICD-10-CM

## 2019-01-28 DIAGNOSIS — R7989 Other specified abnormal findings of blood chemistry: Secondary | ICD-10-CM

## 2019-01-28 DIAGNOSIS — G608 Other hereditary and idiopathic neuropathies: Secondary | ICD-10-CM

## 2019-01-28 NOTE — Procedures (Signed)
[facility]  [address], [place]  [place], [place] [address] Tel: [phone] Fax:  [phone] Test Date:  01/28/2019  Patient: [name] DOB: 02-04-1961 Physician: Narda Amber, DO  Sex: Male Height: 5\' 10"  Ref Phys: Narda Amber, DO  ID#: [id number] Temp: 34.0C Technician:    Patient Complaints: This is a 58 year old man referred for evaluation of subacute onset of bilateral hand and feet numbness and tingling.  NCV & EMG Findings: Extensive electrodiagnostic testing of the left upper and lower extremity shows:  1. Left median and radial sensory responses show reduced amplitude (L14.4, L6.8 V).  Left ulnar sensory response shows prolonged latency (3.6 ms) and reduced amplitude (4.6 V).   2. Left superficial peroneal sensory response is absent.  Left sural sensory responses within normal limits.   3. All motor responses including the left median, ulnar, peroneal, and tibial nerves are within normal limits.   4. Left ulnar F wave is mildly prolonged.  Left tibial H reflex study is within normal limits.   5. Reduced recruitment pattern is seen in the left gastrocnemius and flexor digitorum longus muscles, without change in motor unit configuration.  The remaining tested muscles show normal motor unit recruitment and configuration.  Fibrillation potentials are not seen in any of the tested muscles, including the lower lumbar paraspinal muscles.    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  The presence of sural sparing warrants further evaluation for GBS-like subtypes.   ___________________________ Narda Amber, DO    Nerve Conduction Studies Anti Sensory Summary Table   Site NR Peak (ms) Norm Peak (ms) P-T Amp (V) Norm P-T Amp  Left Median Anti Sensory (2nd Digit)  34C  Wrist    3.6 <3.6 14.4 >15  Left Radial Anti Sensory (Base  1st Digit)  34C  Wrist    2.0 <2.7 6.8 >14  Left Sup Peroneal Anti Sensory (Ant Lat Mall)  34C  12 cm NR  <4.6  >4  Left Sural Anti Sensory (Lat Mall)  34C  Calf    3.2 <4.6 5.0 >4  Left Ulnar Anti Sensory (5th Digit)  34C  Wrist    3.6 <3.1 4.6 >10   Motor Summary Table   Site NR Onset (ms) Norm Onset (ms) O-P Amp (mV) Norm O-P Amp Site1 Site2 Delta-0 (ms) Dist (cm) Vel (m/s) Norm Vel (m/s)  Left Median Motor (Abd Poll Brev)  34C  Wrist    3.4 <4.0 12.9 >6 Elbow Wrist 5.3 31.0 58 >50  Elbow    8.7  12.1         Left Peroneal Motor (Ext Dig Brev)  34C  Ankle    3.6 <6.0 5.8 >2.5 B Fib Ankle 8.5 43.0 51 >40  B Fib    12.1  5.6  Poplt B Fib 1.8 9.0 50 >40  Poplt    13.9  5.4         Left Peroneal TA Motor (Tib Ant)  34C  Fib Head    3.5 <4.5 6.6 >3 Poplit Fib Head 1.4 9.0 64 >40  Poplit    4.9  6.3         Left Tibial Motor (Abd Hall Brev)  34C  Ankle    4.9 <6.0 5.4 >4 Knee Ankle 11.0 45.0 41 >40  Knee    15.9  3.6         Left Ulnar Motor (Abd  Dig Minimi)  34C  Wrist    2.5 <3.1 9.9 >7 B Elbow Wrist 4.7 24.0 51 >50  B Elbow    7.2  8.9  A Elbow B Elbow 1.9 10.0 53 >50  A Elbow    9.1  7.8          F Wave Studies   NR F-Lat (ms) Lat Norm (ms) L-R F-Lat (ms)  Left Ulnar (Mrkrs) (Abd Dig Min)  34C     33.71 <33    H Reflex Studies   NR H-Lat (ms) Lat Norm (ms) L-R H-Lat (ms)  Left Tibial (Gastroc)  34C     33.61 <35    EMG   Side Muscle Ins Act Fibs Psw Fasc Number Recrt Dur Dur. Amp Amp. Poly Poly. Comment  Left AntTibialis Nml Nml Nml Nml Nml Nml Nml Nml Nml Nml Nml Nml N/A  Left Gastroc Nml Nml Nml Nml 2- Mod-V Nml Nml Nml Nml Nml Nml N/A  Left Flex Dig Long Nml Nml Nml Nml 2- Mod-V Nml Nml Nml Nml Nml Nml N/A  Left RectFemoris Nml Nml Nml Nml Nml Nml Nml Nml Nml Nml Nml Nml N/A  Left GluteusMed Nml Nml Nml Nml Nml Nml Nml Nml Nml Nml Nml Nml N/A  Left Lumbo Parasp Low Nml Nml Nml Nml Nml Nml Nml Nml Nml Nml Nml Nml N/A  Left 1stDorInt Nml Nml Nml Nml Nml  Nml Nml Nml Nml Nml Nml Nml N/A  Left Abd Poll Brev Nml Nml Nml Nml Nml Nml Nml Nml Nml Nml Nml Nml N/A  Left Ext Indicis Nml Nml Nml Nml Nml Nml Nml Nml Nml Nml Nml Nml N/A  Left PronatorTeres Nml Nml Nml Nml Nml Nml Nml Nml Nml Nml Nml Nml N/A  Left Biceps Nml Nml Nml Nml Nml Nml Nml Nml Nml Nml Nml Nml N/A  Left Triceps Nml Nml Nml Nml Nml Nml Nml Nml Nml Nml Nml Nml N/A  Left Deltoid Nml Nml Nml Nml Nml Nml Nml Nml Nml Nml Nml Nml N/A      Waveforms:

## 2019-01-29 LAB — HEPATIC FUNCTION PANEL
ALT: 84 IU/L — ABNORMAL HIGH (ref 0–44)
AST: 116 IU/L — ABNORMAL HIGH (ref 0–40)
Albumin: 4.8 g/dL (ref 3.8–4.9)
Alkaline Phosphatase: 77 IU/L (ref 39–117)
Bilirubin Total: 0.4 mg/dL (ref 0.0–1.2)
Bilirubin, Direct: 0.18 mg/dL (ref 0.00–0.40)
Total Protein: 7.6 g/dL (ref 6.0–8.5)

## 2019-01-30 ENCOUNTER — Telehealth: Payer: Self-pay | Admitting: Pharmacist

## 2019-01-30 DIAGNOSIS — E785 Hyperlipidemia, unspecified: Secondary | ICD-10-CM

## 2019-01-30 MED ORDER — ROSUVASTATIN CALCIUM 20 MG PO TABS: 20.0000 mg | ORAL_TABLET | Freq: Every day | ORAL | 11 refills | Status: AC

## 2019-01-30 NOTE — Telephone Encounter (Signed)
Liver enezymes have improved. Still elevated but known fatty liver. Unclear however, if patient started taking rosuvastatin again as instructed. I have sent over an Rx to [facility]. Patient to call back to let me know is he has been taking or not. If he has not, we will need to set up LFT again in a few weeks. Reminded patient that he needs to be seen by Dr. Fuller Plan. Patient upset that since his heart attack everything has gone down hill. He now has neuropathy and is upset that people keep asking him about drinking ETOH. Per patient he only drinks once per week, but will stop if he needs to.

## 2019-01-31 ENCOUNTER — Telehealth: Payer: Self-pay | Admitting: Neurology

## 2019-01-31 ENCOUNTER — Other Ambulatory Visit: Payer: Self-pay

## 2019-01-31 DIAGNOSIS — G608 Other hereditary and idiopathic neuropathies: Secondary | ICD-10-CM

## 2019-01-31 DIAGNOSIS — R202 Paresthesia of skin: Secondary | ICD-10-CM

## 2019-01-31 NOTE — Telephone Encounter (Signed)
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  The next step is CSF testing to evaluate for signs of inflammation, which he was agreeable with.  PLAN: Lumbar puncture - check CSF cell count and diff, protein, glucose, IgG index, oligoclonal bands, myelin basic protein, flow cytology, flow cytometry (cytospin), ACE Further recommendations pending results.  DP

## 2019-01-31 NOTE — Telephone Encounter (Signed)
I ordered LP

## 2019-02-03 NOTE — Telephone Encounter (Signed)
Patient states he did not start rosuvastatin prior to the most recent lft. He did pick up rosuvastatin 20mg  from pharmacy. Will repeat LFT on 10/26.

## 2019-02-04 ENCOUNTER — Telehealth: Payer: Self-pay

## 2019-02-04 NOTE — Telephone Encounter (Signed)
Informed patient of results, patient is to contact [facility] for his appt.

## 2019-02-04 NOTE — Telephone Encounter (Signed)
Patient left vm that he was returning a call to [place]. Thanks!

## 2019-02-04 NOTE — Telephone Encounter (Signed)
-----   Message from Alda Berthold, DO sent at 02/04/2019 10:17 AM EDT ----- Please follow-up with [facility] on his LP appointment. Thanks.

## 2019-02-04 NOTE — Telephone Encounter (Signed)
I spoke with patient and advised of EMG results, patient is going to contact [facility] to get the appt for the LP procedure.

## 2019-02-06 ENCOUNTER — Other Ambulatory Visit: Payer: Self-pay | Admitting: Family Medicine

## 2019-02-06 DIAGNOSIS — I2510 Atherosclerotic heart disease of native coronary artery without angina pectoris: Secondary | ICD-10-CM

## 2019-02-10 ENCOUNTER — Telehealth: Payer: Self-pay | Admitting: Cardiology

## 2019-02-10 ENCOUNTER — Ambulatory Visit: Payer: 59 | Admitting: Neurology

## 2019-02-10 NOTE — Telephone Encounter (Signed)
Left voice mail to call back 

## 2019-02-10 NOTE — Telephone Encounter (Signed)
Its ok to hold Plavix, but restart as soon as possible after the procedure.

## 2019-02-10 NOTE — Telephone Encounter (Signed)
°  ° °  [facility] Pre-operative Risk Assessment    Request for surgical clearance:  1. What type of surgery is being performed? Lumbar Puncture   2. When is this surgery scheduled? TBD   3. What type of clearance is required (medical clearance vs. Pharmacy clearance to hold med vs. Both)? pharmacy  4. Are there any medications that need to be held prior to surgery and how long? Plavix 5 days  5. Practice name and name of physician performing surgery? [facility]  6. What is your office phone number: [phone]   7.   What is your office fax number: [phone]  8.   Anesthesia type (None, local, MAC, general) ? Local  Roberta from [facility] just faxed a clearance as well.   Johnna Acosta 02/10/2019, 9:48 AM  _________________________________________________________________   (provider comments below)

## 2019-02-10 NOTE — Telephone Encounter (Signed)
   Primary Cardiologist: Ena Dawley, MD  Chart reviewed as part of pre-operative protocol coverage.   [name] is a 58 y.o. male history of CAD status post STEMI treated with DES to the circumflex OM 11/2016 with follow-up cath 1 week later showed patent stent.   Patient underwent cardiac catheterization 06/10/2018 and had successful DES to the mid RCA and had residual 70% inferior branch of the OM 2 with patent OM 2 stent and moderate nonobstructive LAD disease.   He underwent repeat cardiac catheterization 07/24/2018 that showed patent RCA stent with no progression of disease.  Dr. Meda Coffee, patient having lumbar puncture and requested to hold Plavix for 5 days. Per cath report 06/10/2018 "Continue dual antiplatelet therapy with aspirin and clopidogrel for at least 12 months, ideally longer".  Can patient hold plavix? Please forward your response to [facility] DIV PREOP. IF okay, then needs phone call.   Thank you   Leanor Kail, PA 02/10/2019, 1:41 PM

## 2019-02-11 ENCOUNTER — Other Ambulatory Visit: Payer: Self-pay

## 2019-02-11 ENCOUNTER — Ambulatory Visit: Payer: Self-pay | Admitting: *Deleted

## 2019-02-11 ENCOUNTER — Encounter: Payer: 59 | Admitting: Neurology

## 2019-02-11 ENCOUNTER — Encounter: Payer: Self-pay | Admitting: Family Medicine

## 2019-02-11 ENCOUNTER — Ambulatory Visit: Payer: 59 | Admitting: Family Medicine

## 2019-02-11 ENCOUNTER — Telehealth: Payer: Self-pay | Admitting: Cardiology

## 2019-02-11 ENCOUNTER — Other Ambulatory Visit: Payer: Self-pay | Admitting: Family Medicine

## 2019-02-11 VITALS — BP 96/68 | HR 103 | Temp 97.7°F | Ht 71.0 in | Wt 230.0 lb

## 2019-02-11 DIAGNOSIS — I959 Hypotension, unspecified: Secondary | ICD-10-CM | POA: Diagnosis not present

## 2019-02-11 NOTE — Telephone Encounter (Signed)
Pt calling in to let Dr. Meda Coffee know that he went in to see his PCP Dr. Nani Ravens today (in [place] and can see his OV note and labs drew on pt), for complains of symptomatic hypotension.  Pt states his BP was as low as 83/50 when he took it this morning, and at his PCP appt it was 96/68 HR-103.  Pt states that he has been feeling dizzy, light-headed, and having pre-syncopal episodes for almost 5 days now.  Pt states  he woke up this morning, took his meds then took his BP about an hour later, for he felt dizzy and lightheaded. Pt states when he took his BP, he noted his numbers were low at 83/50, which then prompted him to call his PCP and make an appt to be seen today.  Pt states he saw his PCP Dr. Nani Ravens today, and he ordered for him to STOP taking his amlodipine and TEMPORARILY HOLD his losartan until his BP gets back to normal. He stated he advised him to drink plenty of fluids with electrolytes, and follow back up with him in his office for this Friday 10/23 at 2:30 pm, to re-evaluate his symptoms and pressure. Pt states he wanted to make Dr. Meda Coffee aware of this, for Dr. Nani Ravens advised him to, and he wanted to let her know that she can see his office note and labs he drew on the pt from today. Pt states he is aware that if his symptoms worsen between now and his visit with Dr. Nani Ravens on Friday, he is to report to the ER for further evaluation and treatment.  Advised the pt to follow all instructions given to him by his PCP today, and keep his follow-up appt as planned for his Friday.  Advised the pt that he should hydrate well, and drink electrolytes as Dr. Nani Ravens advised.  Advised the pt to STOP his amlodipine and temporarily HOLD is  losartan as advised by Dr. Nani Ravens and follow-up with him on 10/23. Informed the pt that I will route this message to Dr. Meda Coffee for her review, and to look at his OV note with Dr. Nani Ravens from today, and labs results once they are available.  Pt verbalized  understanding and agrees with this plan.

## 2019-02-11 NOTE — Telephone Encounter (Signed)
Pt c/o BP issue: STAT if pt c/o blurred vision, one-sided weakness or slurred speech  1. What are your last 5 BP readings?  83/48  2. Are you having any other symptoms (ex. Dizziness, headache, blurred vision, passed out)?  Dizzy, lightheaded, slight blurry vision  3. What is your BP issue? Pt is concerned about his low bp. He gets lightheaded to the point where he feels like he will faint.   Pt just saw his PCP and his PCP advised him to stop taking both his losartan (COZAAR) 100 MG tablet and amlodipine  He will go back to see his PCP on Friday after being off of his medication for 3 days.   The call was disconnected before further information could be given

## 2019-02-11 NOTE — Telephone Encounter (Signed)
   Primary Cardiologist: Ena Dawley, MD  Chart reviewed as part of pre-operative protocol coverage. Given past medical history and time since last visit, based on ACC/AHA guidelines, [name] would be at acceptable risk for the planned procedure without further cardiovascular testing.   Patient may hold Plavix for 5 days prior to lumbar puncture and restart as soon as possible after the procedure.  His RCRI is class IV with an 11% risk of a major cardiac event.   I will route this recommendation to the requesting party via Epic fax function and remove from pre-op pool.  Please call with questions.  [name] Nayana Lenig NP-C    11/26/2018 11:58 AM    [address] Office [phone] Fax [phone]

## 2019-02-11 NOTE — Telephone Encounter (Signed)
I agree with PCP plan.

## 2019-02-11 NOTE — Telephone Encounter (Signed)
Appt scheduled

## 2019-02-11 NOTE — Telephone Encounter (Signed)
Patient called back returning a call from our office °

## 2019-02-11 NOTE — Telephone Encounter (Signed)
Patient is calling to report his BP is too low and he has started feeling dizzy at times. Patient has not taken his morning dose of BP medication because his BP is so low- advised to wait until he is seen.  Call to office- appointment has been made  Reason for Disposition . [id number] Systolic BP [id number] AND [id number] taking blood pressure medications AND [3] dizzy, lightheaded or weak  Answer Assessment - Initial Assessment Questions 1. BLOOD PRESSURE: "What is the blood pressure?" "Did you take at least two measurements 5 minutes apart?"     98/64, 97/58, 97/68    Presently- 103/76 P 97 2. ONSET: "When did you take your blood pressure?"     Stared at 8 am and then 9 am and 10:30 am 3. HOW: "How did you obtain the blood pressure?" (e.g., visiting nurse, automatic home BP monitor)     Automatic BP cuff 4. HISTORY: "Do you have a history of low blood pressure?" "What is your blood pressure normally?"     Usually high- now is low- has changes diet 5. MEDICATIONS: "Are you taking any medications for blood pressure?" If yes: "Have they been changed recently?"     Yes- no chnages 6. PULSE RATE: "Do you know what your pulse rate is?"      P- 66,63 7. OTHER SYMPTOMS: "Have you been sick recently?" "Have you had a recent injury?"     no 8. PREGNANCY: "Is there any chance you are pregnant?" "When was your last menstrual period?"     n/a  Protocols used: LOW BLOOD PRESSURE-A-AH

## 2019-02-11 NOTE — Patient Instructions (Addendum)
Stay hydrated. Consider adding salt tabs or drinking something with electrolytes (like Gatorade, Powerade or Pedialyte).   Give [place] 1-2 business days to get the results of your labs back.   Stop the Norvasc. Temporarily hold the losartan until your blood pressure gets back to normal.   Let us know if you need anything.

## 2019-02-11 NOTE — Progress Notes (Signed)
Chief Complaint  Patient presents with  . Hypotension  . Dizziness    Subjective [name] is a 58 y.o. male who presents for hypertension follow up. He has been having low blood pressures with dizziness over the past 5 days. He does monitor home blood pressures. Blood pressures ranging from 90's/60's on average. He is compliant with medications. Patient has these side effects of medication: none He is adhering to a healthy diet overall. Current exercise: some walking   Past Medical History:  Diagnosis Date  . BPH (benign prostatic hyperplasia)   . CAD (coronary artery disease)    a. NSTEMI troponin >65 with cath 11/2016 S/p PTCA & DES to mid circumflex. b. 05/2018 s/p DES to [facility].  Marland Kitchen Chronic chest pain   . Chronic combined systolic and diastolic CHF (congestive heart failure) ([place])    a. EF 45% in 2018. b. EF 55-60% by echo 01/2017. c. EF 48% by nuc 07/2017.  . CKD (chronic kidney disease), stage II   . Cluster headache    hx of  . Complication of anesthesia    difficult waking up"  . Diverticulosis   . Drug abuse ([place])    hx of  . GERD (gastroesophageal reflux disease)   . Hiatal hernia   . History of kidney stones   . Hyperlipidemia   . Hypertension   . Hypothyroidism   . Nontoxic multinodular goiter   . NSTEMI (non-ST elevated myocardial infarction) ([place]) 2018  . Perforated nasal septum   . Pulmonary nodule --- CT 10/19/2014: Nodule is stable, no further routine x-rays 01/06/2009  . Thrombocytopenia ([place])   . TIA (transient ischemic attack)   . Trigeminal neuralgia   . Tubular adenoma of colon 12/2015  . Unspecified asthma(493.90)     Review of Systems Cardiovascular: no chest pain Respiratory:  no shortness of breath  Exam BP 96/68 (BP Location: Left Arm, Patient Position: Sitting, Cuff Size: Normal)   Pulse (!) 103   Temp 97.7 F (36.5 C) (Temporal)   Ht 5\' 11"  (1.803 m)   Wt 230 lb (104.3 kg)   SpO2 97%   BMI 32.08 kg/m  General:  well  developed, well nourished, in no apparent distress Heart: RRR, no bruits, no LE edema Lungs: clear to auscultation, no accessory muscle use Psych: well oriented with normal range of affect and appropriate judgment/insight  Hypotension, unspecified hypotension type - Plan: CBC, Comprehensive metabolic panel, TSH  Check labs.  Stay hydrated, consider salt tabs or something with electrolytes to help with volume status.  I will have him stop the Norvasc and hold Cozaar until I see him next.  Continue checking blood pressures at home. F/u in 3 d. The patient voiced understanding and agreement to the plan.  [place], DO 02/11/19  4:11 PM

## 2019-02-12 ENCOUNTER — Telehealth: Payer: Self-pay | Admitting: Neurology

## 2019-02-12 ENCOUNTER — Other Ambulatory Visit: Payer: Self-pay | Admitting: Family Medicine

## 2019-02-12 LAB — COMPREHENSIVE METABOLIC PANEL
ALT: 59 U/L — ABNORMAL HIGH (ref 0–53)
AST: 98 U/L — ABNORMAL HIGH (ref 0–37)
Albumin: 4.3 g/dL (ref 3.5–5.2)
Alkaline Phosphatase: 50 U/L (ref 39–117)
BUN: 32 mg/dL — ABNORMAL HIGH (ref 6–23)
CO2: 21 mEq/L (ref 19–32)
Calcium: 9.3 mg/dL (ref 8.4–10.5)
Chloride: 96 mEq/L (ref 96–112)
Creatinine, Ser: 2.39 mg/dL — ABNORMAL HIGH (ref 0.40–1.50)
GFR: 34.24 mL/min — ABNORMAL LOW (ref >60.00)
Glucose, Bld: 110 mg/dL — ABNORMAL HIGH (ref 70–99)
Potassium: 3.1 mEq/L — ABNORMAL LOW (ref 3.5–5.1)
Sodium: 134 mEq/L — ABNORMAL LOW (ref 135–145)
Total Bilirubin: 0.4 mg/dL (ref 0.2–1.2)
Total Protein: 7.1 g/dL (ref 6.0–8.3)

## 2019-02-12 LAB — CBC
HCT: 37.6 % — ABNORMAL LOW (ref 39.0–52.0)
Hemoglobin: 12.8 g/dL — ABNORMAL LOW (ref 13.0–17.0)
MCHC: 34 g/dL (ref 30.0–36.0)
MCV: 105.4 fl — ABNORMAL HIGH (ref 78.0–100.0)
Platelets: 188 10*3/uL (ref 150.0–400.0)
RBC: 3.57 Mil/uL — ABNORMAL LOW (ref 4.22–5.81)
RDW: 16.7 % — ABNORMAL HIGH (ref 11.5–15.5)
WBC: 7 10*3/uL (ref 4.0–10.5)

## 2019-02-12 LAB — TSH: TSH: 0.81 u[IU]/mL (ref 0.35–4.50)

## 2019-02-12 MED ORDER — POTASSIUM CHLORIDE CRYS ER 10 MEQ PO TBCR: 20.0000 meq | EXTENDED_RELEASE_TABLET | Freq: Two times a day (BID) | ORAL | 0 refills | Status: DC

## 2019-02-12 NOTE — Telephone Encounter (Signed)
Patients do not need to take anything more than tylenol for pain.  The most important thing to prevent headache following a spinal tap is resting and drinking plenty of fluid before and after the procedure.  If the headache persists, drinking caffeinated beverage (coffee, coke, etc) may also help.  We do not prescribe pain medication for this, as it is not routinely necessary.

## 2019-02-12 NOTE — Telephone Encounter (Signed)
I contacted patient and gave him the advise from Dr.Patel

## 2019-02-12 NOTE — Telephone Encounter (Signed)
Left message with the after after hour service  On 02-11-19 @ 5:27 pm   Caller cannot take tylenol morphine tramadol due to heart meds he is taking he is having a procedure done to take spinal fluid on Monday and is wanting to know if the office can prescribe something for after the surgery that he can take

## 2019-02-13 ENCOUNTER — Other Ambulatory Visit: Payer: Self-pay

## 2019-02-14 ENCOUNTER — Encounter: Payer: Self-pay | Admitting: Family Medicine

## 2019-02-14 ENCOUNTER — Ambulatory Visit: Payer: 59 | Admitting: Family Medicine

## 2019-02-14 VITALS — BP 92/62 | HR 85 | Temp 96.8°F | Ht 70.5 in | Wt 235.5 lb

## 2019-02-14 DIAGNOSIS — I952 Hypotension due to drugs: Secondary | ICD-10-CM

## 2019-02-14 DIAGNOSIS — E876 Hypokalemia: Secondary | ICD-10-CM | POA: Diagnosis not present

## 2019-02-14 DIAGNOSIS — G44009 Cluster headache syndrome, unspecified, not intractable: Secondary | ICD-10-CM | POA: Diagnosis not present

## 2019-02-14 DIAGNOSIS — N179 Acute kidney failure, unspecified: Secondary | ICD-10-CM | POA: Diagnosis not present

## 2019-02-14 MED ORDER — SUMATRIPTAN 5 MG/ACT NA SOLN: 1.0000 | NASAL | 0 refills | Status: DC | PRN

## 2019-02-14 NOTE — Progress Notes (Signed)
Chief Complaint  Patient presents with  . Follow-up    BP and labs today    Subjective: Patient is a 58 y.o. male here for f/u BP.  Patient was having issues with lightheadedness, fatigue, and lower blood pressures.  He was taken off of his losartan and Norvasc at the last visit.  Since that time, he reports his blood pressures have fluctuated between 90-low 100s/60s-70s.  He is still having some fatigue and lightheadedness.  He is also taking metoprolol 50 mg daily.  In addition to the above, labs were drawn and he was found to have an elevated creatinine and low potassium.  It is unclear whether or not he picked up the potassium supplement but he does not think that he did.  The patient is following with the neurology team.  He is scheduled to have a lumbar puncture next week.  He was told that a side effect is headaches.  Due to elevated liver enzymes, he was told not to take Tylenol.  Due to heart issues, he was also told not to take ibuprofen.  He has a history of cluster headaches and is very nervous that he may experience a relapse of this and is wondering what he can take.  ROS: Heart: Denies chest pain  Lungs: Denies SOB   Past Medical History:  Diagnosis Date  . BPH (benign prostatic hyperplasia)   . CAD (coronary artery disease)    a. NSTEMI troponin >65 with cath 11/2016 S/p PTCA & DES to mid circumflex. b. 05/2018 s/p DES to [facility].  Marland Kitchen Chronic chest pain   . Chronic combined systolic and diastolic CHF (congestive heart failure) ([place])    a. EF 45% in 2018. b. EF 55-60% by echo 01/2017. c. EF 48% by nuc 07/2017.  . CKD (chronic kidney disease), stage II   . Cluster headache    hx of  . Complication of anesthesia    difficult waking up"  . Diverticulosis   . Drug abuse ([place])    hx of  . GERD (gastroesophageal reflux disease)   . Hiatal hernia   . History of kidney stones   . Hyperlipidemia   . Hypertension   . Hypothyroidism   . Nontoxic multinodular goiter   . NSTEMI  (non-ST elevated myocardial infarction) ([place]) 2018  . Perforated nasal septum   . Pulmonary nodule --- CT 10/19/2014: Nodule is stable, no further routine x-rays 01/06/2009  . Thrombocytopenia ([place])   . TIA (transient ischemic attack)   . Trigeminal neuralgia   . Tubular adenoma of colon 12/2015  . Unspecified asthma(493.90)     Objective: BP 92/62 (BP Location: Left Arm, Patient Position: Sitting, Cuff Size: Large)   Pulse 85   Temp (!) 96.8 F (36 C) (Temporal)   Ht 5' 10.5" (1.791 m)   Wt 235 lb 8 oz (106.8 kg)   SpO2 97%   BMI 33.31 kg/m  General: Awake, appears stated age HEENT: MMM, EOMi Heart: RRR, no bruits, no lower extremity edema Lungs: CTAB, no rales, wheezes or rhonchi. No accessory muscle use Psych: Age appropriate judgment and insight, normal affect and mood  Assessment and Plan: Hypotension due to medication - Plan: metoprolol succinate (TOPROL-XL) 25 MG 24 hr tablet  Hypokalemia - Plan: Basic metabolic panel  AKI (acute kidney injury) ([place]) - Plan: Basic metabolic panel  Cluster headache, not intractable, unspecified chronicity pattern - Plan: SUMAtriptan (IMITREX) 5 MG/ACT nasal spray  1-decrease metoprolol from 50 mg daily to 25 mg daily.  Stay off of Norvasc and losartan for now. 2-recheck BMP.  He may need to start taking the supplement. 3-recheck BMP.  Stay hydrated. 4-try to send in sumatriptan nasal spray for cluster headaches.  He will send a message next week if he starts having a side effect from the lumbar puncture and we will trial a prednisone burst. Follow-up pending above. The patient voiced understanding and agreement to the plan.  [place], DO 02/14/19  5:28 PM

## 2019-02-14 NOTE — Patient Instructions (Addendum)
Give [place] 2-3 business days to get the results of your labs back.   Keep the diet clean and stay active.  Stay hydrated. Can back off on the intake if you are peeing so frequently.   We are going back to 25 mg daily of metoprolol.   Let us know if you need anything.

## 2019-02-15 LAB — BASIC METABOLIC PANEL
BUN/Creatinine Ratio: 13 (calc) (ref 6–22)
BUN: 18 mg/dL (ref 7–25)
CO2: 21 mmol/L (ref 20–32)
Calcium: 8.9 mg/dL (ref 8.6–10.3)
Chloride: 103 mmol/L (ref 98–110)
Creat: 1.34 mg/dL — ABNORMAL HIGH (ref 0.70–1.33)
Glucose, Bld: 103 mg/dL — ABNORMAL HIGH (ref 65–99)
Potassium: 3.2 mmol/L — ABNORMAL LOW (ref 3.5–5.3)
Sodium: 139 mmol/L (ref 135–146)

## 2019-02-17 ENCOUNTER — Telehealth: Payer: Self-pay | Admitting: Neurology

## 2019-02-17 ENCOUNTER — Other Ambulatory Visit: Payer: 59

## 2019-02-17 ENCOUNTER — Ambulatory Visit
Admission: RE | Admit: 2019-02-17 | Discharge: 2019-02-17 | Disposition: A | Discharge status: Home / Self Care | Payer: 59 | Source: Ambulatory Visit | Attending: Neurology | Admitting: Neurology

## 2019-02-17 ENCOUNTER — Telehealth: Payer: Self-pay

## 2019-02-17 ENCOUNTER — Other Ambulatory Visit: Payer: Self-pay

## 2019-02-17 VITALS — BP 145/91 | HR 70

## 2019-02-17 DIAGNOSIS — M4306 Spondylolysis, lumbar region: Secondary | ICD-10-CM

## 2019-02-17 DIAGNOSIS — G610 Guillain-Barre syndrome: Secondary | ICD-10-CM

## 2019-02-17 DIAGNOSIS — R202 Paresthesia of skin: Secondary | ICD-10-CM

## 2019-02-17 DIAGNOSIS — G608 Other hereditary and idiopathic neuropathies: Secondary | ICD-10-CM

## 2019-02-17 MED ORDER — ERGOTAMINE TARTRATE 2 MG SL SUBL: SUBLINGUAL_TABLET | SUBLINGUAL | 1 refills | Status: DC

## 2019-02-17 NOTE — Telephone Encounter (Addendum)
Discussed with Dr. Jobe Igo that LP was attempted twice with very little CSF obtained.  Plan to check MRI lumbar spine with and without contrast to evaluate for structural anatomy of the lumbar region to help guide LP.

## 2019-02-17 NOTE — Addendum Note (Signed)
Addended by: Ames Coupe on: 02/17/2019 08:36 AM   Modules accepted: Orders

## 2019-02-17 NOTE — Telephone Encounter (Signed)
Kim from [facility] called and said the patient's lumbar puncture appointment was unsuccessful this morning, they were only able to get 1 cc of fluid and it had blood in it. They'd like a call back from the doctor for guidance on how to proceed with the patient.

## 2019-02-17 NOTE — Telephone Encounter (Signed)
I called in another medicine that is to be used in the event of a cluster headache. Use immediately upon sensing the start of a cluster headache. Ty.

## 2019-02-17 NOTE — Discharge Instructions (Signed)
° °    Lumbar Puncture Discharge Instructions  1. Go home and rest quietly for the next 24 hours.  It is important to lie flat for the next 24 hours.  Get up only to go to the restroom.  You may lie in the bed or on a couch on your back, your stomach, your left side or your right side.  You may have one pillow under your head.  You may have pillows between your knees while you are on your side or under your knees while you are on your back.  2. DO NOT drive today.  Recline the seat as far back as it will go, while still wearing your seat belt, on the way home.  3. You may get up to go to the bathroom as needed.  You may sit up for 10 minutes to eat.  You may resume your normal diet and medications unless otherwise indicated.  Drink lots of extra fluids today and tomorrow.  4. The incidence of headache, nausea, or vomiting is about 5% (one in 20 patients).  If you develop a headache, lie flat and drink plenty of fluids until the headache goes away.  Caffeinated beverages may be helpful.  If you develop severe nausea and vomiting or a headache that does not go away with flat bed rest, call the physician who sent you here.   5. You may resume normal activities after your 24 hours of bed rest is over; however, do not exert yourself strongly or do any heavy lifting tomorrow.  6. Call your physician for a follow-up appointment.   7. If you have any questions  after you arrive home, please call [phone].  Discharge instructions have been explained to the patient.  The patient, or the person responsible for the patient, fully understands these instructions.  YOU MAY RESTART YOUR PLAVIX TODAY 02/17/2019.

## 2019-02-17 NOTE — Telephone Encounter (Signed)
Dr. Jobe Igo from [facility] requests a call from Dr. Posey Pronto personally about this patient.   Please call: [phone].

## 2019-02-17 NOTE — Telephone Encounter (Signed)
Called left message to call back 

## 2019-02-17 NOTE — Addendum Note (Signed)
Addended by: Jesse Fall on: 02/17/2019 11:42 AM   Modules accepted: Orders

## 2019-02-17 NOTE — Progress Notes (Signed)
Pt reports he has been off of his Plavix for 5 days.

## 2019-02-17 NOTE — Telephone Encounter (Signed)
Copied from [place] [id number]. Topic: General - Other >> Feb 17, 2019  7:43 AM Carolyn Stare wrote: Pt call to say [name] does not have the below medication they had to order it, pt said Dr Nani Ravens told him to call back if they did not and he would order him him something else   SUMAtriptan (IMITREX) 5 MG/ACT nasal spray  [name]

## 2019-02-17 NOTE — Telephone Encounter (Signed)
Spoke to patient and relayed below information from Dr. Posey Pronto.  MRI entered and told patient [facility] will be calling him to schedule. Verbalized understanding.

## 2019-02-17 NOTE — Telephone Encounter (Signed)
Call the patient back informed of PCP instructions.

## 2019-02-17 NOTE — Telephone Encounter (Signed)
Copied from [place] [id number]. Topic: General - Other >> Feb 17, 2019  9:35 AM Rainey Pines A wrote: Patient would like a callback from [facility]   Please see result notes

## 2019-02-18 ENCOUNTER — Telehealth: Payer: Self-pay | Admitting: Neurology

## 2019-02-18 MED ORDER — DIAZEPAM 5 MG PO TABS: ORAL_TABLET | ORAL | 0 refills | Status: DC

## 2019-02-18 NOTE — Telephone Encounter (Signed)
Called and informed patient of below info from Dr. Posey Pronto. He is aware and does have a driver both ways to MRI.

## 2019-02-18 NOTE — Telephone Encounter (Signed)
Patient called this morning regarding him having an MRI on 03/08/19 but on a wait list to have one sooner. He is asking if he can have a medication called in for him to be able to have the MRI. [facility] uses [name] [address]. Thank you

## 2019-02-18 NOTE — Telephone Encounter (Signed)
Rx for valium has been sent.  Please inform patient he will need to take someone to drive him as the medication will make him sleepy.

## 2019-02-18 NOTE — Telephone Encounter (Signed)
Dr. Posey Pronto can he have something sent in for premed for MRI?   Called [facility] and asked if they had an earlier date for patient's MRI than the scheduled 03/08/19.They are full but [facility] who I talked to is putting patient on cancellation list and will call him if anything opens up prior to that date.

## 2019-02-20 ENCOUNTER — Other Ambulatory Visit: Payer: Self-pay

## 2019-02-20 ENCOUNTER — Other Ambulatory Visit: Payer: Self-pay | Admitting: Family Medicine

## 2019-02-21 ENCOUNTER — Ambulatory Visit: Payer: 59 | Admitting: Family Medicine

## 2019-02-21 ENCOUNTER — Other Ambulatory Visit: Payer: Self-pay | Admitting: Family Medicine

## 2019-02-21 ENCOUNTER — Telehealth: Payer: Self-pay | Admitting: Family Medicine

## 2019-02-21 ENCOUNTER — Encounter: Payer: Self-pay | Admitting: Family Medicine

## 2019-02-21 ENCOUNTER — Other Ambulatory Visit (INDEPENDENT_AMBULATORY_CARE_PROVIDER_SITE_OTHER): Payer: 59

## 2019-02-21 ENCOUNTER — Other Ambulatory Visit: Payer: Self-pay

## 2019-02-21 VITALS — BP 118/80 | HR 79 | Temp 96.6°F | Ht 70.5 in | Wt 232.4 lb

## 2019-02-21 DIAGNOSIS — R234 Changes in skin texture: Secondary | ICD-10-CM

## 2019-02-21 DIAGNOSIS — R790 Abnormal level of blood mineral: Secondary | ICD-10-CM

## 2019-02-21 DIAGNOSIS — E876 Hypokalemia: Secondary | ICD-10-CM | POA: Diagnosis not present

## 2019-02-21 LAB — MAGNESIUM: Magnesium: 1.4 mg/dL — ABNORMAL LOW (ref 1.5–2.5)

## 2019-02-21 LAB — BASIC METABOLIC PANEL
BUN: 11 mg/dL (ref 6–23)
CO2: 23 mEq/L (ref 19–32)
Calcium: 8.8 mg/dL (ref 8.4–10.5)
Chloride: 103 mEq/L (ref 96–112)
Creatinine, Ser: 1.24 mg/dL (ref 0.40–1.50)
GFR: 73 mL/min (ref >60.00)
Glucose, Bld: 98 mg/dL (ref 70–99)
Potassium: 3.1 mEq/L — ABNORMAL LOW (ref 3.5–5.1)
Sodium: 143 mEq/L (ref 135–145)

## 2019-02-21 MED ORDER — POTASSIUM CHLORIDE CRYS ER 10 MEQ PO TBCR: 20.0000 meq | EXTENDED_RELEASE_TABLET | Freq: Two times a day (BID) | ORAL | 5 refills | Status: AC

## 2019-02-21 MED ORDER — TRIAMCINOLONE ACETONIDE 0.1 % EX CREA: 1.0000 "application " | TOPICAL_CREAM | Freq: Two times a day (BID) | CUTANEOUS | 0 refills | Status: DC

## 2019-02-21 NOTE — Telephone Encounter (Signed)
Copied from [place] [phone]. Topic: Quick Communication - Rx Refill/Question >> Feb 21, 2019  3:28 PM Rainey Pines A wrote: Medication: promethazine (PHENERGAN) 12.5 MG tablet [Pharmacy Med Name: PROMETHAZINE 12.5 MG TABLET (Patient stated that pharmacy has reached out to get medication signed off and sent back over today because patient is completely out.)  Has the patient contacted their pharmacy? Yes (Agent: If no, request that the patient contact the pharmacy for the refill.) (Agent: If yes, when and what did the pharmacy advise?)Contact PCP  Preferred Pharmacy (with phone number or street name): [place], [place] [phone] (Phone) [phone] (Fax)    Agent: Please be advised that RX refills may take up to 3 business days. We ask that you follow-up with your pharmacy.

## 2019-02-21 NOTE — Progress Notes (Signed)
Chief Complaint  Patient presents with  . Follow-up    [name] is a 58 y.o. male here for a skin complaint.  Duration: 5 days Location: hands Pruritic? No Painful? No Drainage? No New soaps/lotions/topicals/detergents? No Sick contacts? No Other associated symptoms: None Therapies tried thus far: Lubriderm  ROS:  Const: No fevers Skin: As noted in HPI  Past Medical History:  Diagnosis Date  . BPH (benign prostatic hyperplasia)   . CAD (coronary artery disease)    a. NSTEMI troponin >65 with cath 11/2016 S/p PTCA & DES to mid circumflex. b. 05/2018 s/p DES to [facility].  Marland Kitchen Chronic chest pain   . Chronic combined systolic and diastolic CHF (congestive heart failure) ([place])    a. EF 45% in 2018. b. EF 55-60% by echo 01/2017. c. EF 48% by nuc 07/2017.  . CKD (chronic kidney disease), stage II   . Cluster headache    hx of  . Complication of anesthesia    difficult waking up"  . Diverticulosis   . Drug abuse ([place])    hx of  . GERD (gastroesophageal reflux disease)   . Hiatal hernia   . History of kidney stones   . Hyperlipidemia   . Hypertension   . Hypothyroidism   . Nontoxic multinodular goiter   . NSTEMI (non-ST elevated myocardial infarction) ([place]) 2018  . Perforated nasal septum   . Pulmonary nodule --- CT 10/19/2014: Nodule is stable, no further routine x-rays 01/06/2009  . Thrombocytopenia ([place])   . TIA (transient ischemic attack)   . Trigeminal neuralgia   . Tubular adenoma of colon 12/2015  . Unspecified asthma(493.90)     BP 118/80 (BP Location: Left Arm, Patient Position: Sitting, Cuff Size: Large)   Pulse 79   Temp (!) 96.6 F (35.9 C) (Temporal)   Ht 5' 10.5" (1.791 m)   Wt 232 lb 6 oz (105.4 kg)   SpO2 94%   BMI 32.87 kg/m  Gen: awake, alert, appearing stated age Lungs: No accessory muscle use Skin: See below. No drainage, erythema, TTP, fluctuance, excoriation Psych: Age appropriate judgment and insight       Peeling skin - Plan:  triamcinolone cream (KENALOG) 0.1 %  Hypokalemia - Plan: Basic metabolic panel  Continue Lubriderm, use after every time he washes his hands, which is frequent.  Trial topical steroid.  Let me know next week if no improvement. He may need to be on chronic potassium replacement. F/u prn. The patient voiced understanding and agreement to the plan.  [place], DO 02/21/19 8:18 AM

## 2019-02-21 NOTE — Patient Instructions (Addendum)
Use Vaseline, Aquaphor or some other non-scented emollient on your hands at least twice daily and every time after you wash your hands. OK to use Lubriderm.   Try to avoid scented products (soaps, detergents, etc) on your hands.  Let me know next week if the cream does not work.    Give [place] 2-3 business days to get the results of your labs back.   Let us know if you need anything.

## 2019-02-21 NOTE — Telephone Encounter (Signed)
Patient is wanting to speak with Dr Irene Limbo nurse regarding medication . Please advise        Call back [phone]

## 2019-02-24 NOTE — Telephone Encounter (Signed)
Patient already had question answered

## 2019-02-25 ENCOUNTER — Other Ambulatory Visit: Payer: Self-pay

## 2019-02-25 ENCOUNTER — Telehealth: Payer: Self-pay | Admitting: Neurology

## 2019-02-25 ENCOUNTER — Ambulatory Visit: Payer: 59 | Admitting: Family Medicine

## 2019-02-25 ENCOUNTER — Telehealth: Payer: Self-pay | Admitting: Family Medicine

## 2019-02-25 ENCOUNTER — Encounter: Payer: Self-pay | Admitting: Family Medicine

## 2019-02-25 VITALS — BP 134/82 | HR 95 | Temp 97.7°F | Ht 70.5 in | Wt 232.0 lb

## 2019-02-25 DIAGNOSIS — M792 Neuralgia and neuritis, unspecified: Secondary | ICD-10-CM | POA: Diagnosis not present

## 2019-02-25 DIAGNOSIS — S81801A Unspecified open wound, right lower leg, initial encounter: Secondary | ICD-10-CM | POA: Diagnosis not present

## 2019-02-25 MED ORDER — PREDNISONE 20 MG PO TABS: 40.0000 mg | ORAL_TABLET | Freq: Every day | ORAL | 0 refills | Status: AC

## 2019-02-25 MED ORDER — OXYCODONE HCL 5 MG PO TABS: 2.5000 mg | ORAL_TABLET | Freq: Four times a day (QID) | ORAL | 0 refills | Status: DC | PRN

## 2019-02-25 MED ORDER — NORTRIPTYLINE HCL 25 MG PO CAPS: 25.0000 mg | ORAL_CAPSULE | Freq: Every day | ORAL | 2 refills | Status: DC

## 2019-02-25 NOTE — Telephone Encounter (Signed)
Called the patient and scheduled appt today at 2 pm

## 2019-02-25 NOTE — Telephone Encounter (Signed)
Patient notified awaiting, authorization.

## 2019-02-25 NOTE — Telephone Encounter (Signed)
Patient states that [facility] told him that his insurance may need approval for the MRI schedule on 03-08-19. Patient is in a lot of pain and they have a opening tomorrow and he would like to take that appt but we need to get the prior auth before they will move the appt.

## 2019-02-25 NOTE — Telephone Encounter (Signed)
Patient called again to check on the status of his request. He'd like to have his MRI tomorrow but it needs prior authorization first.  Routing to [place] and Cc: [employer]

## 2019-02-25 NOTE — Telephone Encounter (Signed)
I sent message to referrals asking regarding this,awaiting

## 2019-02-25 NOTE — Telephone Encounter (Signed)
Pt has been experiencing Pain starting at the feet and is now in his legs, up to the knee and he is not able to sleep. Pt is in pain and has an appt at the end of the week but feels he can not wait that long/ Pt would like to know what Dr. Nani Ravens would recommend. [name] advise

## 2019-02-25 NOTE — Patient Instructions (Addendum)
Do not drink alcohol, do any illicit/street drugs, drive or do anything that requires alertness while on this medicine.   Use triple antibiotic ointment twice daily over the area on your foot.  Let us know if you need anything.

## 2019-02-25 NOTE — Telephone Encounter (Signed)
Please call patient and let him know if we got it approved so he can move appt

## 2019-02-25 NOTE — Progress Notes (Signed)
Chief Complaint  Patient presents with  . Leg Pain  . Foot Pain    Subjective: Patient is a 58 y.o. male here for f/u neuropathy.  Patient continues have issues with neuropathy.  He is currently taking gabapentin 300 mg twice daily.  Over the past 2 weeks, things got much worse.  He has decreased sensation in his lower extremities equally that tapers up to the knee.  He is now having extreme pain in his toes and balls of his feet.  It is affecting his gait.  He has an MRI scheduled with the neurology team on 11/14.  2 attempts were made for a lumbar puncture that ultimately failed thus far.  Due to heart disease, he is not able to safely take anti-inflammatories.  Over the past week, he has had a sore on his right foot.  No drainage.  He has been using triple antibiotic ointment daily and washing frequently.  Denies fevers.  ROS: Neuro: Decreased sensation in feet Constitutional: No fevers  Past Medical History:  Diagnosis Date  . BPH (benign prostatic hyperplasia)   . CAD (coronary artery disease)    a. NSTEMI troponin >65 with cath 11/2016 S/p PTCA & DES to mid circumflex. b. 05/2018 s/p DES to [facility].  Marland Kitchen Chronic chest pain   . Chronic combined systolic and diastolic CHF (congestive heart failure) ([place])    a. EF 45% in 2018. b. EF 55-60% by echo 01/2017. c. EF 48% by nuc 07/2017.  . CKD (chronic kidney disease), stage II   . Cluster headache    hx of  . Complication of anesthesia    difficult waking up"  . Diverticulosis   . Drug abuse ([place])    hx of  . GERD (gastroesophageal reflux disease)   . Hiatal hernia   . History of kidney stones   . Hyperlipidemia   . Hypertension   . Hypothyroidism   . Nontoxic multinodular goiter   . NSTEMI (non-ST elevated myocardial infarction) ([place]) 2018  . Perforated nasal septum   . Pulmonary nodule --- CT 10/19/2014: Nodule is stable, no further routine x-rays 01/06/2009  . Thrombocytopenia ([place])   . TIA (transient ischemic attack)   .  Trigeminal neuralgia   . Tubular adenoma of colon 12/2015  . Unspecified asthma(493.90)     Objective: BP 134/82 (BP Location: Left Arm, Patient Position: Sitting, Cuff Size: Normal)   Pulse 95   Temp 97.7 F (36.5 C) (Temporal)   Ht 5' 10.5" (1.791 m)   Wt 232 lb (105.2 kg)   SpO2 96%   BMI 32.82 kg/m  General: Awake, appears stated age Heart: BP pulses 2+ b/l, brisk cap refill Lungs: No accessory muscle use Skin: See below Neuro: Decreased sensation to pinprick b/l on plantar surfaces and dorsum of foot up to midfoot. Still decreased sensation up to knees, but less so. MSK: No ttp over feet. Flat feet.  Psych: Age appropriate judgment and insight, normal affect and mood  Assessment and Plan: Neuropathic pain - Plan: oxyCODONE (OXY IR/ROXICODONE) 5 MG immediate release tablet, nortriptyline (PAMELOR) 25 MG capsule, predniSONE (DELTASONE) 20 MG tablet  Wound of right lower extremity, initial encounter  1- Orders as above. Start TCA, pred burst. Oxy for breakthrough pain. Warnings regarding this verbalized and written down. Will follow up with Neuro to inform them and see if there are any recs prior to MRI.  2- tao bid for 7 days. Let me know if not improving.  The patient voiced understanding  and agreement to the plan.  [place], DO 02/25/19  3:13 PM

## 2019-02-25 NOTE — Telephone Encounter (Signed)
Pt is pending auth it's under nurse review records were faxed early this morning

## 2019-02-26 NOTE — Telephone Encounter (Signed)
Thank you will forward this to [place] so she is aware as well

## 2019-02-28 ENCOUNTER — Other Ambulatory Visit: Payer: 59

## 2019-02-28 LAB — GRAM STAIN
GRAM STAIN:: NONE SEEN
MICRO NUMBER:: 1029207
SPECIMEN QUALITY:: ADEQUATE

## 2019-02-28 LAB — CSF CELL COUNT WITH DIFFERENTIAL
Basophils, %: 0 %
Eosinophils, CSF: 2 % — ABNORMAL HIGH
Lymphs, CSF: 23 % — ABNORMAL LOW (ref 40–80)
Monocyte/Macrophage: 2 % — ABNORMAL LOW (ref 15–45)
RBC Count, CSF: 19650 cells/uL — ABNORMAL HIGH (ref 0–10)
Segmented Neutrophils-CSF: 73 % — ABNORMAL HIGH (ref 0–6)
WBC, CSF: 5 cells/uL (ref 0–5)

## 2019-02-28 LAB — OLIGOCLONAL BANDS, CSF + SERM

## 2019-02-28 LAB — PROTEIN, CSF: Total Protein, CSF: 99 mg/dL — ABNORMAL HIGH (ref 15–45)

## 2019-03-03 ENCOUNTER — Telehealth: Payer: Self-pay | Admitting: Family Medicine

## 2019-03-03 ENCOUNTER — Other Ambulatory Visit: Payer: 59 | Admitting: *Deleted

## 2019-03-03 ENCOUNTER — Other Ambulatory Visit: Payer: Self-pay

## 2019-03-03 DIAGNOSIS — E785 Hyperlipidemia, unspecified: Secondary | ICD-10-CM

## 2019-03-03 LAB — COMPREHENSIVE METABOLIC PANEL
ALT: 87 IU/L — ABNORMAL HIGH (ref 0–44)
AST: 111 IU/L — ABNORMAL HIGH (ref 0–40)
Albumin/Globulin Ratio: 1.4 (ref 1.2–2.2)
Albumin: 4.7 g/dL (ref 3.8–4.9)
Alkaline Phosphatase: 79 IU/L (ref 39–117)
BUN/Creatinine Ratio: 15 (ref 9–20)
BUN: 13 mg/dL (ref 6–24)
Bilirubin Total: 0.5 mg/dL (ref 0.0–1.2)
CO2: 16 mmol/L — ABNORMAL LOW (ref 20–29)
Calcium: 9.2 mg/dL (ref 8.7–10.2)
Chloride: 90 mmol/L — ABNORMAL LOW (ref 96–106)
Creatinine, Ser: 0.85 mg/dL (ref 0.76–1.27)
GFR calc Af Amer: 113 mL/min/{1.73_m2} (ref >59)
GFR calc non Af Amer: 98 mL/min/{1.73_m2} (ref >59)
Globulin, Total: 3.3 g/dL (ref 1.5–4.5)
Glucose: 140 mg/dL — ABNORMAL HIGH (ref 65–99)
Potassium: 3.2 mmol/L — ABNORMAL LOW (ref 3.5–5.2)
Sodium: 133 mmol/L — ABNORMAL LOW (ref 134–144)
Total Protein: 8 g/dL (ref 6.0–8.5)

## 2019-03-03 LAB — HEPATIC FUNCTION PANEL: Bilirubin, Direct: 0.23 mg/dL (ref 0.00–0.40)

## 2019-03-03 LAB — LIPID PANEL
Chol/HDL Ratio: 2.2 ratio (ref 0.0–5.0)
Cholesterol, Total: 304 mg/dL — ABNORMAL HIGH (ref 100–199)
HDL: 139 mg/dL (ref >39)
LDL Chol Calc (NIH): 147 mg/dL — ABNORMAL HIGH (ref 0–99)
Triglycerides: 112 mg/dL (ref 0–149)
VLDL Cholesterol Cal: 18 mg/dL (ref 5–40)

## 2019-03-03 MED ORDER — PREGABALIN 100 MG PO CAPS: 100.0000 mg | ORAL_CAPSULE | Freq: Two times a day (BID) | ORAL | 0 refills | Status: DC

## 2019-03-03 NOTE — Telephone Encounter (Signed)
The nortriptyline should double as both something to help with sleep and neuropathy. Give this one a little more time for the sleep aspect of things. Ty.

## 2019-03-03 NOTE — Telephone Encounter (Signed)
Copied from [place] [phone]. Topic: General - Other >> Mar 03, 2019  1:33 PM Yvette Rack wrote: Reason for CRM: Pt stated he was just speaking with Dr. Irene Limbo nurse and he would also like to request a prescription for a medication to help him sleep at night.

## 2019-03-03 NOTE — Telephone Encounter (Signed)
Topical capsaicin cream would be an over the counter option. Another option would be to trial a medicine called pregabalin/Lyrica. I can send that in if needed. Ty.

## 2019-03-03 NOTE — Telephone Encounter (Signed)
Called informed the patient of PCP response. He will get the OTC cream and try. Please send in the Lyrica

## 2019-03-03 NOTE — Telephone Encounter (Signed)
Informed that neurology ordered this and that he should contact that office regarding possible seeing if it can be moved up. The patient stated he is having so much pain in his legs/feet and taking the oxycodone (with motrin) and still not helping. He said is there anything else he can do for the pain.

## 2019-03-03 NOTE — Telephone Encounter (Signed)
Pt saw dr Nani Ravens on 02-25-2019 for neuropathy . Pt can not feel his feet. Pt has mri sch for 03-08-2019 and would like to see if md could get him a sooner appt. Pt is on cancellation list for MRI.  Pt would like to know the step he is still in pain

## 2019-03-03 NOTE — Addendum Note (Signed)
Addended by: Ames Coupe on: 03/03/2019 01:40 PM   Modules accepted: Orders

## 2019-03-03 NOTE — Telephone Encounter (Signed)
Called informed the patient of PCP instructions regarding medication. He verbalized understanding.

## 2019-03-03 NOTE — Telephone Encounter (Signed)
Is this even possible from our end? Neurology placed the order. Ty.

## 2019-03-05 ENCOUNTER — Telehealth: Payer: Self-pay | Admitting: Neurology

## 2019-03-05 NOTE — Addendum Note (Signed)
Addended by: Marcelle Overlie D on: 03/05/2019 07:59 AM   Modules accepted: Orders

## 2019-03-05 NOTE — Telephone Encounter (Signed)
Please see approval

## 2019-03-05 NOTE — Telephone Encounter (Signed)
Peer-to-peer completed.  MRI lumbar spine wwo contrast approved.   Prior Auth#:  [id number]  Valid until:  04/19/2019  Donika K. Posey Pronto, DO

## 2019-03-07 ENCOUNTER — Telehealth: Payer: Self-pay

## 2019-03-07 NOTE — Telephone Encounter (Signed)
Copied from [place] [id number]. Topic: Appointment Scheduling - Scheduling Inquiry for Clinic >> Mar 07, 2019  9:47 AM Lennox Solders wrote: reason for CRM: pt is calling and he was not aware he had lab appt on 02-28-2019 . Pt saw [facility] on 02-25-2019

## 2019-03-07 NOTE — Telephone Encounter (Signed)
We were following up on his potassium. I think another provider ended up rechecking his labs and are rechecking in future. I want him to stay on his supplement. Ty.

## 2019-03-07 NOTE — Telephone Encounter (Signed)
Called informed the patient of information per PCP. The patient verbalized understanding

## 2019-03-08 ENCOUNTER — Ambulatory Visit
Admission: RE | Admit: 2019-03-08 | Discharge: 2019-03-08 | Disposition: A | Discharge status: Home / Self Care | Payer: 59 | Source: Ambulatory Visit | Attending: Neurology | Admitting: Neurology

## 2019-03-08 ENCOUNTER — Other Ambulatory Visit: Payer: Self-pay

## 2019-03-08 DIAGNOSIS — M4306 Spondylolysis, lumbar region: Secondary | ICD-10-CM

## 2019-03-08 DIAGNOSIS — G610 Guillain-Barre syndrome: Secondary | ICD-10-CM

## 2019-03-08 MED ORDER — GADOBENATE DIMEGLUMINE 529 MG/ML IV SOLN
20.0000 mL | Freq: Once | INTRAVENOUS | Status: AC | PRN
  Administered 2019-03-08: 14:00:00 20 mL via INTRAVENOUS

## 2019-03-10 ENCOUNTER — Other Ambulatory Visit: Payer: Self-pay | Admitting: Family Medicine

## 2019-03-10 ENCOUNTER — Telehealth: Payer: Self-pay | Admitting: Neurology

## 2019-03-10 NOTE — Telephone Encounter (Signed)
~  Dr. Posey Pronto patient  Results are back

## 2019-03-10 NOTE — Telephone Encounter (Signed)
Patient called to see if his MRI results are ready for him yet.

## 2019-03-11 ENCOUNTER — Telehealth: Payer: Self-pay | Admitting: Cardiology

## 2019-03-11 ENCOUNTER — Other Ambulatory Visit: Payer: Self-pay

## 2019-03-11 DIAGNOSIS — M4306 Spondylolysis, lumbar region: Secondary | ICD-10-CM

## 2019-03-11 DIAGNOSIS — G610 Guillain-Barre syndrome: Secondary | ICD-10-CM

## 2019-03-11 DIAGNOSIS — M47819 Spondylosis without myelopathy or radiculopathy, site unspecified: Secondary | ICD-10-CM

## 2019-03-11 NOTE — Telephone Encounter (Signed)
Order Lumbar puncture at [facility].

## 2019-03-11 NOTE — Telephone Encounter (Signed)
Please let the patient know that preoperative clearance was sent to requesting office on 02/11/2019.  I have resent this again today, 03/11/2019  Thank you

## 2019-03-11 NOTE — Telephone Encounter (Signed)
Called patient and informed him that his MRI lumbar spine shows multilevel degenerative facet arthropathy with spinal and foraminal stenosis, causing impingement at L4, L5, and S1 nerve roots bilaterally, as well as disc protrusion at the left L2-3 and L3-4 segments.  He will undergo repeat lumbar puncture with IR as his prior LP was unsuccessful at two levels.  He continues to have severe bilateral feet pain and was recently started on Lyrica 100mg  BID.  Pain is worse at night time.   PLAN: Check CSF cell count and diff, protein, glucose, IgG index, oligoclonal bands, myelin basic protein, flow cytology, flow cytometry (cytospin), ACE. Increase Lyrica to 100mg  in the morning and 200mg  at bedtime.  Titrate further, as tolerable.   Further recommendations pending results.  Jeffrey Kilmer K. Posey Pronto, DO

## 2019-03-11 NOTE — Telephone Encounter (Signed)
Follow Up:      Pt is calling to find out the status of his clearance to stop his {lavix and Aspirin before his procedure

## 2019-03-13 ENCOUNTER — Other Ambulatory Visit: Payer: Self-pay | Admitting: Family Medicine

## 2019-03-13 ENCOUNTER — Telehealth: Payer: Self-pay | Admitting: Neurology

## 2019-03-13 ENCOUNTER — Other Ambulatory Visit: Payer: Self-pay

## 2019-03-13 MED ORDER — DULOXETINE HCL 30 MG PO CPEP: 30.0000 mg | ORAL_CAPSULE | Freq: Every day | ORAL | 3 refills | Status: AC

## 2019-03-13 NOTE — Telephone Encounter (Signed)
Please instruct him to stop Lyrica and start Cymbalta 30mg  daily.  Side effects include dizziness and sleepiness, so he can take at bedtime.  If agreeable, please send 30-day supply with 3 refills.

## 2019-03-13 NOTE — Telephone Encounter (Signed)
Patient called back and said he just cannot take the Lyrica 100 MG 3 times a day because it makes him too tired throughout the day. He'd like to know what his alternatives are.  [name] [facility]

## 2019-03-13 NOTE — Telephone Encounter (Signed)
I spoke with patient and he will follow Dr.Patel's orders as planned, will call back with report in a few days

## 2019-03-13 NOTE — Telephone Encounter (Signed)
Patient called regarding his medication change to Lyrica. He said that he felt Lethargic and just wanted to sleep. Please Call. Thank you

## 2019-03-13 NOTE — Telephone Encounter (Signed)
Please readvise on Lyrica

## 2019-03-13 NOTE — Telephone Encounter (Signed)
I spoke with patient and he wants to start cymbalta, he will d'c Lyrica, as Dr. Posey Pronto instructed

## 2019-03-13 NOTE — Telephone Encounter (Signed)
Adjust lyrica to 100mg  tid per Dr.Patel.

## 2019-03-16 ENCOUNTER — Other Ambulatory Visit: Payer: Self-pay | Admitting: Family Medicine

## 2019-03-16 DIAGNOSIS — G629 Polyneuropathy, unspecified: Secondary | ICD-10-CM

## 2019-03-17 ENCOUNTER — Other Ambulatory Visit: Payer: 59

## 2019-03-19 ENCOUNTER — Ambulatory Visit
Admission: RE | Admit: 2019-03-19 | Discharge: 2019-03-19 | Disposition: A | Discharge status: Home / Self Care | Payer: 59 | Source: Ambulatory Visit | Attending: Neurology | Admitting: Neurology

## 2019-03-19 ENCOUNTER — Other Ambulatory Visit: Payer: Self-pay

## 2019-03-19 ENCOUNTER — Other Ambulatory Visit (HOSPITAL_COMMUNITY)
Admission: RE | Admit: 2019-03-19 | Discharge: 2019-03-19 | Disposition: A | Discharge status: Home / Self Care | Payer: 59 | Source: Ambulatory Visit | Attending: Neurology | Admitting: Neurology

## 2019-03-19 DIAGNOSIS — G610 Guillain-Barre syndrome: Secondary | ICD-10-CM

## 2019-03-19 LAB — CYTOLOGY - NON PAP

## 2019-03-19 NOTE — Discharge Instructions (Signed)

## 2019-03-24 ENCOUNTER — Telehealth: Payer: Self-pay | Admitting: Neurology

## 2019-03-24 ENCOUNTER — Telehealth: Payer: Self-pay

## 2019-03-24 MED ORDER — DICLOFENAC SODIUM 25 MG PO TBEC: 25.0000 mg | DELAYED_RELEASE_TABLET | Freq: Two times a day (BID) | ORAL | 0 refills | Status: DC

## 2019-03-24 MED ORDER — BUTALBITAL-APAP-CAFFEINE 50-325-40 MG PO TABS: 1.0000 | ORAL_TABLET | Freq: Four times a day (QID) | ORAL | 0 refills | Status: DC | PRN

## 2019-03-24 NOTE — Telephone Encounter (Signed)
Please tell him to drink lots of water as this will help.  I will send prescription for pain medication for his headache. This medication has a mix of aspirin, tylenol, and caffeine - please confirm that he can take tylenol since it is listed as an allergy (nausea/vomiting).  If the medication and hydration does not help improve pain in 1-2 days, please tell him to call back and will schedule him for a blood patch, where interventional radiology will need to go back and help close any area where there may be a small leak.

## 2019-03-24 NOTE — Telephone Encounter (Signed)
Patient stated,"he cant take tylenol". Allergy to it, can you send anything else in?

## 2019-03-24 NOTE — Telephone Encounter (Signed)
I contacted [name] and cancelled Fiorcet Rx. Patient notified of the medication called in. Patient will call office back in a few days with a report.

## 2019-03-24 NOTE — Telephone Encounter (Signed)
Fioricet cancelled (please contact his pharmacy and let them know not to fill it).  I will send Rx for diclofenac which is an NSAID.  He can take 1 tablet twice daily as needed for pain.

## 2019-03-24 NOTE — Telephone Encounter (Signed)
For you review, FYI

## 2019-03-24 NOTE — Telephone Encounter (Signed)
Patient notified of results and pharmacy contacted

## 2019-03-24 NOTE — Telephone Encounter (Signed)
Patient left msg with after hours about having lumbar puncture on Wednesday last week and having a headache ever since. They removed 32ml of fluid and his headache is a #8 on scale 1-10. Had to stay in bed all day. Thanks!

## 2019-03-26 ENCOUNTER — Ambulatory Visit: Payer: 59 | Admitting: Cardiology

## 2019-03-26 ENCOUNTER — Encounter: Payer: Self-pay | Admitting: *Deleted

## 2019-03-26 ENCOUNTER — Other Ambulatory Visit: Payer: Self-pay

## 2019-03-26 ENCOUNTER — Encounter: Payer: Self-pay | Admitting: Cardiology

## 2019-03-26 ENCOUNTER — Other Ambulatory Visit: Payer: 59 | Admitting: *Deleted

## 2019-03-26 VITALS — BP 126/84 | HR 88 | Ht 70.5 in | Wt 225.6 lb

## 2019-03-26 DIAGNOSIS — Z95820 Peripheral vascular angioplasty status with implants and grafts: Secondary | ICD-10-CM

## 2019-03-26 DIAGNOSIS — I2510 Atherosclerotic heart disease of native coronary artery without angina pectoris: Secondary | ICD-10-CM | POA: Diagnosis not present

## 2019-03-26 DIAGNOSIS — E785 Hyperlipidemia, unspecified: Secondary | ICD-10-CM

## 2019-03-26 DIAGNOSIS — R0609 Other forms of dyspnea: Secondary | ICD-10-CM

## 2019-03-26 DIAGNOSIS — I10 Essential (primary) hypertension: Secondary | ICD-10-CM | POA: Diagnosis not present

## 2019-03-26 DIAGNOSIS — R0602 Shortness of breath: Secondary | ICD-10-CM | POA: Diagnosis not present

## 2019-03-26 DIAGNOSIS — R0600 Dyspnea, unspecified: Secondary | ICD-10-CM

## 2019-03-26 DIAGNOSIS — I739 Peripheral vascular disease, unspecified: Secondary | ICD-10-CM

## 2019-03-26 LAB — LIPID PANEL
Chol/HDL Ratio: 2.6 ratio (ref 0.0–5.0)
Cholesterol, Total: 254 mg/dL — ABNORMAL HIGH (ref 100–199)
HDL: 97 mg/dL (ref >39)
LDL Chol Calc (NIH): 141 mg/dL — ABNORMAL HIGH (ref 0–99)
Triglycerides: 95 mg/dL (ref 0–149)
VLDL Cholesterol Cal: 16 mg/dL (ref 5–40)

## 2019-03-26 LAB — COMPREHENSIVE METABOLIC PANEL
ALT: 77 IU/L — ABNORMAL HIGH (ref 0–44)
AST: 212 IU/L — ABNORMAL HIGH (ref 0–40)
Albumin/Globulin Ratio: 1.6 (ref 1.2–2.2)
Albumin: 4.1 g/dL (ref 3.8–4.9)
Alkaline Phosphatase: 62 IU/L (ref 39–117)
BUN/Creatinine Ratio: 10 (ref 9–20)
BUN: 10 mg/dL (ref 6–24)
Bilirubin Total: 0.4 mg/dL (ref 0.0–1.2)
CO2: 23 mmol/L (ref 20–29)
Calcium: 9.8 mg/dL (ref 8.7–10.2)
Chloride: 96 mmol/L (ref 96–106)
Creatinine, Ser: 0.96 mg/dL (ref 0.76–1.27)
GFR calc Af Amer: 102 mL/min/{1.73_m2} (ref >59)
GFR calc non Af Amer: 89 mL/min/{1.73_m2} (ref >59)
Globulin, Total: 2.6 g/dL (ref 1.5–4.5)
Glucose: 104 mg/dL — ABNORMAL HIGH (ref 65–99)
Potassium: 3.3 mmol/L — ABNORMAL LOW (ref 3.5–5.2)
Sodium: 140 mmol/L (ref 134–144)
Total Protein: 6.7 g/dL (ref 6.0–8.5)

## 2019-03-26 NOTE — Progress Notes (Signed)
Cardiology Office Note:    Date:  03/26/2019   ID:  [name], DOB May 02, 1960, MRN [id number]  PCP:  [name], DO  Cardiologist:  Ena Dawley, MD  Electrophysiologist:  None   Referring MD: [name]*   Chief complain: DOE  History of Present Illness:    [name] is a 58 y.o. male with a hx of CAD status post STEMI treated with DES to the circumflex OM 11/2016 with follow-up cath 1 week later showed patent stent and LVEF improved to 55 to 60%.  No ischemia on Lexiscan 07/2017.  Patient had repeat nuclear stress test for chest pain and had evidence of ischemia along the a.m. Patient underwent cardiac catheterization 06/10/2018 and had successful DES to the mid RCA and had residual 70% inferior branch of the OM 2 with patent OM 2 stent and moderate nonobstructive LAD disease.  If patient has recurrent chest pain refractory to antianginal therapy PCI to inferior branch to the OM 2 could be considered although this is a small vessel.  Aggressive medical management recommended.  He was seen by Estella Husk on 06/26/2018 and stated that RCA stent has made all the difference in the world for him.  No further chest pain.  Complains of insomnia.  Blood pressure has been running high at home.  He was again readmitted on July 23, 2018 with chest pains and underwent repeat cardiac catheterization that showed patent RCA stent with no progression of disease.  09/11/2018 -he states that he continues to have chest pains not related to exertion, might be slightly worse on deep inspiration, it comes and goes, is left-sided and sharp.  Not associated with shortness of breath.  He has been compliant with rosuvastatin but his LDL is 135 despite that.  He was previously enrolled in cardiac rehab but this was uninterrupted because of COVID-19.  11/15/2018 - at the last visit complained of atypical chest pain suspicious for acute pericarditis, we added colchicine 0.6 mg  p.o. twice daily for 3 months and follow-up in 4 weeks to see what his symptoms are at this point.  I did not add ibuprofen given that he is already on dual antiplatelet therapy. He was admitted on 11/01/2018 with nausea/vomiting, chest pain, CTA shows no evidence of pulmonary embolus, cardiology was consulted and didn't feel that symptoms were cardiac in origin. He was discharged on antiemetics. He also had elevated LFTs. Hepatitis panel unremarkable. Abdominal ultrasound: negative for gallstones or acute cholecystitis. Small volume of sludge in the gallbladder. Fatty infiltration of liver. Attributed to dehydration. OP follow up with general surgery to evaluate for GB removal.  11/15/2018 - He continues to have dyspnea on exertion but no chest pain, he describes situations like when cleaning his house when he has to stop and sit down.  He is getting enrolled in cardiac rehab.  He has no lower extremity edema no orthopnea proximal nocturnal dyspnea.  He has been compliant with his medications.  He has recently lost 15 pounds.  03/26/2019 - 6 months follow up, he is having on and off DOE when walking stairs or short dis trances at home, its getting progressively worse, no chest pain. He has been diagnosed with neuropathy, no dg of DM, he underwent lumbar puncture and is awaiting results. Also has claudications, no LE edema or orthopnea.  Past Medical History:  Diagnosis Date  . BPH (benign prostatic hyperplasia)   . CAD (coronary artery disease)    a. NSTEMI troponin >65 with  cath 11/2016 S/p PTCA & DES to mid circumflex. b. 05/2018 s/p DES to [facility].  Marland Kitchen Chronic chest pain   . Chronic combined systolic and diastolic CHF (congestive heart failure) ([place])    a. EF 45% in 2018. b. EF 55-60% by echo 01/2017. c. EF 48% by nuc 07/2017.  . CKD (chronic kidney disease), stage II   . Cluster headache    hx of  . Complication of anesthesia    difficult waking up"  . Diverticulosis   . Drug abuse ([place])    hx of   . GERD (gastroesophageal reflux disease)   . Hiatal hernia   . History of kidney stones   . Hyperlipidemia   . Hypertension   . Hypothyroidism   . Nontoxic multinodular goiter   . NSTEMI (non-ST elevated myocardial infarction) ([place]) 2018  . Perforated nasal septum   . Pulmonary nodule --- CT 10/19/2014: Nodule is stable, no further routine x-rays 01/06/2009  . Thrombocytopenia ([place])   . TIA (transient ischemic attack)   . Trigeminal neuralgia   . Tubular adenoma of colon 12/2015  . Unspecified asthma(493.90)     Past Surgical History:  Procedure Laterality Date  . APPENDECTOMY    . CARDIAC CATHETERIZATION N/A 11/25/2014   Procedure: Left Heart Cath and Coronary Angiography;  Surgeon: Belva Crome, MD;  Location: [place] [facility];  Service: Cardiovascular;  Laterality: N/A;  . CARDIAC CATHETERIZATION  12/08/2008   Archie Endo 08/24/2010  . CHOLECYSTECTOMY N/A 12/09/2018   Procedure: LAPAROSCOPIC CHOLECYSTECTOMY WITH INTRAOPERATIVE CHOLANGIOGRAM;  Surgeon: Erroll Luna, MD;  Location: [place];  Service: General;  Laterality: N/A;  . COLECTOMY  ~ 1976   "I had a blockage"  . CORONARY ANGIOPLASTY  2018,2020   stents placed  . CORONARY STENT INTERVENTION N/A 11/27/2016   Procedure: CORONARY STENT INTERVENTION;  Surgeon: Belva Crome, MD;  Location: [place] [facility];  Service: Cardiovascular;  Laterality: N/A;  . CORONARY STENT INTERVENTION N/A 06/10/2018   Procedure: CORONARY STENT INTERVENTION;  Surgeon: Nelva Bush, MD;  Location: [place] [facility];  Service: Cardiovascular;  Laterality: N/A;  . CORONARY STENT PLACEMENT  11/27/2016   STENT XIENCE ALPINE RX 3.0X15 drug eluting stent was successfully placed  . CYST EXCISION Left 10/31/2016   Jaw  . CYSTOSCOPY/URETEROSCOPY/HOLMIUM LASER/STENT PLACEMENT Left 01/16/2019   Procedure: CYSTOSCOPY LEFT URETEROSCOPY/HOLMIUM LASER/STENT PLACEMENT;  Surgeon: Cleon Gustin, MD;  Location: [facility];  Service: Urology;  Laterality: Left;   . DESTRUCTION TRIGEMINAL NERVE VIA NEUROLYTIC AGENT  x 3 , R side last ~2010  . KNEE ARTHROSCOPY Right 06/26/2000   Arthroscopy followed by open lateral release/notes 09/06/2010  . LEFT HEART CATH AND CORONARY ANGIOGRAPHY N/A 11/27/2016   Procedure: LEFT HEART CATH AND CORONARY ANGIOGRAPHY;  Surgeon: Belva Crome, MD;  Location: [place] [facility];  Service: Cardiovascular;  Laterality: N/A;  . LEFT HEART CATH AND CORONARY ANGIOGRAPHY N/A 12/07/2016   Procedure: LEFT HEART CATH AND CORONARY ANGIOGRAPHY;  Surgeon: Nelva Bush, MD;  Location: [place] [facility];  Service: Cardiovascular;  Laterality: N/A;  . LEFT HEART CATH AND CORONARY ANGIOGRAPHY N/A 06/10/2018   Procedure: LEFT HEART CATH AND CORONARY ANGIOGRAPHY;  Surgeon: Nelva Bush, MD;  Location: [place] [facility];  Service: Cardiovascular;  Laterality: N/A;  . LEFT HEART CATH AND CORONARY ANGIOGRAPHY N/A 07/24/2018   Procedure: LEFT HEART CATH AND CORONARY ANGIOGRAPHY;  Surgeon: Belva Crome, MD;  Location: [place] [facility];  Service: Cardiovascular;  Laterality: N/A;  .  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 Ardine Bjork 09/06/2010  . SHOULDER HEMI-ARTHROPLASTY Right 05/26/2010   revision/notes 05/27/2010    Current Medications: Current Meds  Medication Sig  . aspirin EC 81 MG tablet Take 81 mg by mouth daily.  . calcium carbonate (TUMS EX) 750 MG chewable tablet Chew 2 tablets by mouth as needed for heartburn.  . clopidogrel (PLAVIX) 75 MG tablet Take 75 mg by mouth daily.  . diclofenac (VOLTAREN) 25 MG EC tablet Take 1 tablet (25 mg total) by mouth 2 (two) times daily.  . DULoxetine (CYMBALTA) 30 MG capsule Take 1 capsule (30 mg total) by mouth daily.  . Evolocumab (REPATHA SURECLICK) [id number] MG/ML SOAJ Inject 140 mg into the skin every 14 (fourteen) days.  Marland Kitchen ezetimibe (ZETIA) 10 MG tablet TAKE ONE TABLET BY MOUTH DAILY  . fluticasone (FLONASE) 50 MCG/ACT nasal spray Place 2 sprays into both nostrils daily  as needed for allergies or rhinitis.  Marland Kitchen isosorbide mononitrate (IMDUR) 30 MG 24 hr tablet Take 1 tablet (30 mg total) by mouth daily.  Marland Kitchen ketoconazole (NIZORAL) 2 % cream Apply 1 application topically 2 (two) times daily.  . lansoprazole (PREVACID) 30 MG capsule TAKE ONE CAPSULE BY MOUTH TWICE A DAY BEFORE MEALS  . levocetirizine (XYZAL) 5 MG tablet Take 1 tablet (5 mg total) by mouth every evening.  . metoprolol succinate (TOPROL-XL) 25 MG 24 hr tablet Take 1 tablet (25 mg total) by mouth daily.  . nitroGLYCERIN (NITROSTAT) 0.4 MG SL tablet Place 1 tablet (0.4 mg total) under the tongue every 5 (five) minutes as needed for chest pain.  . nortriptyline (PAMELOR) 25 MG capsule Take 1 capsule (25 mg total) by mouth at bedtime.  Vladimir Faster Glycol-Propyl Glycol (SYSTANE OP) Place 1 drop into both eyes every 3 (three) hours as needed (dry eyes).   . potassium chloride (KLOR-CON) 10 MEQ tablet Take 2 tablets (20 mEq total) by mouth 2 (two) times daily.  . pregabalin (LYRICA) 100 MG capsule Take 1 capsule (100 mg total) by mouth 2 (two) times daily.  . promethazine (PHENERGAN) 12.5 MG tablet TAKE ONE TABLET BY MOUTH EVERY 6 HOURS AS NEEDED FOR NAUSEA AND VOMITING  . rosuvastatin (CRESTOR) 20 MG tablet Take 1 tablet (20 mg total) by mouth daily.  Marland Kitchen triamcinolone cream (KENALOG) 0.1 % Apply 1 application topically 2 (two) times daily. Let sit for at least 20 minutes before washing hands     Allergies:   Morphine and related, Tylenol [acetaminophen], and Tramadol   Social History   Socioeconomic History  . Marital status: Married    Spouse name: Not on file  . Number of children: 2  . Years of education: 80  . Highest education level: Not on file  Occupational History  . Occupation: Merchandiser, retail [employer]  . Financial resource strain: Not hard at all  . Food insecurity    Worry: Never true    Inability: Never true  . Transportation needs    Medical: No    Non-medical: No   Tobacco Use  . Smoking status: Former Smoker    Packs/day: 0.25    Years: 12.00    Pack years: 3.00    Types: Cigarettes    Quit date: 11/15/2016    Years since quitting: 2.3  . Smokeless tobacco: Never Used  Substance and Sexual Activity  . Alcohol use: Yes    Alcohol/week: 1.0 standard drinks    Types: 1 Shots of liquor  per week    Comment: occ  . Drug use: No  . Sexual activity: Yes  Lifestyle  . Physical activity    Days per week: 3 days    Minutes per session: 30 min  . Stress: Only a little  Relationships  . Social connections    Talks on phone: More than three times a week    Gets together: Three times a week    Attends religious service: More than 4 times per year    Active member of club or organization: No    Attends meetings of clubs or organizations: Not on file    Relationship status: Married  Other Topics Concern  . Not on file  Social History Narrative   HSG   Culinary school in [place]   Married    2 children   One story   Right handed                  Family History: The patient's family history includes CAD in his father; Cancer in his father; Diabetes in his mother; Heart disease in his father and maternal aunt; Heart failure in his father; Hyperlipidemia in his mother; Hypertension in his father and mother; Mesothelioma in his father; Sudden death in his father. There is no history of Prostate cancer or Colon cancer.  ROS:   Please see the history of present illness.    All other systems reviewed and are negative.  EKGs/Labs/Other Studies Reviewed:    The following studies were reviewed today:  EKG:  EKG is ordered today.  The ekg ordered today demonstrates R, normal ECG, personally reviewed.  Recent Labs: 12/02/2018: B Natriuretic Peptide 47.0 02/11/2019: Hemoglobin 12.8; Platelets 188.0; TSH 0.81 02/21/2019: Magnesium 1.4 03/03/2019: ALT 87; BUN 13; Creatinine, Ser 0.85; Potassium 3.2; Sodium 133  Recent Lipid Panel    Component  Value Date/Time   CHOL 304 (H) 03/03/2019 0956   TRIG 112 03/03/2019 0956   HDL 139 03/03/2019 0956   CHOLHDL 2.2 03/03/2019 0956   CHOLHDL 2.7 08/07/2018 0326   VLDL 30 07/27/2018 0326   LDLCALC 147 (H) 03/03/2019 0956   LDLDIRECT 184 (H) 01/03/2019 0934   LDLDIRECT 209.8 07/20/2009 0951    Physical Exam:    VS:  BP 126/84   Pulse 88   Ht 5' 10.5" (1.791 m)   Wt 225 lb 9.6 oz (102.3 kg)   SpO2 96%   BMI 31.91 kg/m     Wt Readings from Last 3 Encounters:  03/26/19 225 lb 9.6 oz (102.3 kg)  02/25/19 232 lb (105.2 kg)  02/21/19 232 lb 6 oz (105.4 kg)     GEN:  Well nourished, well developed in no acute distress HEENT: Normal NECK: No JVD; No carotid bruits LYMPHATICS: No lymphadenopathy CARDIAC: RRR, no murmurs, rubs, gallops RESPIRATORY:  Clear to auscultation without rales, wheezing or rhonchi  ABDOMEN: Soft, non-tender, non-distended MUSCULOSKELETAL:  No edema; No deformity, poor peripheral pulses B/L SKIN: Warm and dry NEUROLOGIC:  Alert and oriented x 3 PSYCHIATRIC:  Normal affect   ASSESSMENT:    1. CAD in native artery   2. Shortness of breath   3. S/P angioplasty with stent   4. Essential hypertension   5. Claudication ([place])   6. DOE (dyspnea on exertion)    PLAN:    In order of problems listed above:  1. Chest Pain with Known CAD-status post recent PCI:  -Status post DES RCA 05/2018, Previously placed stent patent and no significant progression  of residual disease on cath 4/1, he had 50% -Continue ASA, BB, ARB, statin and especially Plavix.  Continue  imdur -worsening DOE, I would order an exercise nuclear stress test.  2. Chronic Combined CHF: - LVEF now 62% on cardiac MRI today with only 25 to 50% endocardial scar in the basal and mid inferolateral walls with mild hypokinesis in the mid inferolateral wall.  Previously LVEF 50% on cardiac cath. - He appears euvolemic now.  3. Hypertension -controlled after adding Imdur 30 mg daily.  4.  Hyperlipidemia -He was started on Repatha, and Zetia in addition to crestor, we will repeat lipids and LFTs today.   5. Claudications - obtain B/L LE arterial Duplex.  Follow up in 3 months.  Medication Adjustments/Labs and Tests Ordered: Current medicines are reviewed at length with the patient today.  Concerns regarding medicines are outlined above.  Orders Placed This Encounter  Procedures  . Myocardial Perfusion Imaging  . EKG 12-Lead  . VAS [place] LOWER EXTREMITY ARTERIAL DUPLEX   No orders of the defined types were placed in this encounter.  There are no Patient Instructions on file for this visit.   Signed, Ena Dawley, MD  03/26/2019 11:09 AM    [place]

## 2019-03-26 NOTE — Patient Instructions (Signed)
Medication Instructions:   Your physician recommends that you continue on your current medications as directed. Please refer to the Current Medication list given to you today.  *If you need a refill on your cardiac medications before your next appointment, please call your pharmacy*   Testing/Procedures:  Your physician has requested that you have en exercise stress myoview. For further information please visit [email or web address]. Please follow instruction sheet, as given.  PLEASE DO ON D-SPECT PER DR NELSON   Your physician has requested that you have a lower extremity arterial duplex. This test is an ultrasound of the arteries in the legs or arms. It looks at arterial blood flow in the legs and arms. Allow one hour for Lower and Upper Arterial scans. There are no restrictions or special instructions   Follow-Up: At [facility], you and your health needs are our priority.  As part of our continuing mission to provide you with exceptional heart care, we have created designated Provider Care Teams.  These Care Teams include your primary Cardiologist (physician) and Advanced Practice Providers (APPs -  Physician Assistants and Nurse Practitioners) who all work together to provide you with the care you need, when you need it.  Your next appointment:   3 month(s)  The format for your next appointment:   In Person  Provider:   WITH AN EXTENDER IN OUR OFFICE

## 2019-03-27 ENCOUNTER — Other Ambulatory Visit: Payer: Self-pay

## 2019-03-27 ENCOUNTER — Telehealth: Payer: Self-pay | Admitting: Pharmacist

## 2019-03-27 ENCOUNTER — Telehealth: Payer: Self-pay | Admitting: Neurology

## 2019-03-27 DIAGNOSIS — G6181 Chronic inflammatory demyelinating polyneuritis: Secondary | ICD-10-CM

## 2019-03-27 DIAGNOSIS — I2510 Atherosclerotic heart disease of native coronary artery without angina pectoris: Secondary | ICD-10-CM

## 2019-03-27 LAB — MYELIN BASIC PROTEIN, CSF: Myelin Basic Protein: <2 mcg/L (ref 2.0–4.0)

## 2019-03-27 LAB — CNS IGG SYNTHESIS RATE, CSF+BLOOD
Albumin Serum: 4.2 g/dL (ref 3.5–5.2)
Albumin, CSF: 26.7 mg/dL (ref 8.0–42.0)
CNS-IgG Synthesis Rate: -1.8 mg/24 h (ref <=3.3)
IgG (Immunoglobin G), Serum: 1250 mg/dL (ref 600–1640)
IgG Total CSF: 4.1 mg/dL (ref 0.8–7.7)
IgG-Index: 0.52 (ref <0.66)

## 2019-03-27 LAB — PROTEIN, CSF: Total Protein, CSF: 48 mg/dL — ABNORMAL HIGH (ref 15–45)

## 2019-03-27 LAB — CSF CELL COUNT WITH DIFFERENTIAL
RBC Count, CSF: 0 cells/uL
WBC, CSF: 1 cells/uL (ref 0–5)

## 2019-03-27 LAB — ANGIOTENSIN CONVERTING ENZYME, CSF: ACE, CSF: 4 U/L (ref <=15)

## 2019-03-27 LAB — LEUKEMIA/LYMPHOMA EVALUATION PANEL

## 2019-03-27 LAB — GLUCOSE, CSF: Glucose, CSF: 77 mg/dL (ref 40–80)

## 2019-03-27 LAB — OLIGOCLONAL BANDS, CSF + SERM

## 2019-03-27 MED ORDER — EZETIMIBE 10 MG PO TABS: 10.0000 mg | ORAL_TABLET | Freq: Every day | ORAL | 3 refills | Status: AC

## 2019-03-27 NOTE — Addendum Note (Signed)
Addended by: SUPPLE, MEGAN E on: 03/27/2019 09:42 AM   Modules accepted: Orders

## 2019-03-27 NOTE — Telephone Encounter (Signed)
Called pt to discuss recent lipid panel and medication adherence - he reports compliance to Repatha every 2 weeks. There was some confusion over his rosuvastatin which he did restart about 3 weeks ago. He doesn't think he has been taking his ezetimibe since he was confusing this with an allergy medication. Discussed the importance of adhering to his 3 lipid lowering medications. Will recheck lipids and LFTs in 6 weeks (LFTs have remained chronically elevated due to fatty liver - ALT is stable since resuming statin although AST has increased) - can follow up in lipid clinic if LDL remains elevated at that time.

## 2019-03-27 NOTE — Telephone Encounter (Signed)
Results of CSF testing was discussed with patient which shows very mild elevation in protein and no other findings of inflammation (normal IgG index, OCB, and MBP).  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 Risks and benefits discussed.  Follow-up with me in the office in 6-8 weeks.  Donika K. Posey Pronto, DO

## 2019-03-27 NOTE — Telephone Encounter (Signed)
Patient is scheduled 12/14-12/18 at 8 am for solumedrol infusion.  Then 05/02/2019 at 8am at the Patient care center.  He is to follow up with Dr Posey Pronto 05/12/2019 at 830 am.  Orders placed.

## 2019-03-28 ENCOUNTER — Encounter (HOSPITAL_COMMUNITY): Payer: Self-pay | Admitting: Physician Assistant

## 2019-03-28 ENCOUNTER — Other Ambulatory Visit: Payer: Self-pay | Admitting: Cardiology

## 2019-03-28 DIAGNOSIS — I739 Peripheral vascular disease, unspecified: Secondary | ICD-10-CM

## 2019-03-29 ENCOUNTER — Other Ambulatory Visit: Payer: Self-pay | Admitting: Family Medicine

## 2019-03-31 ENCOUNTER — Telehealth: Payer: Self-pay | Admitting: Neurology

## 2019-03-31 NOTE — Telephone Encounter (Signed)
Patient left msg with after hours about having bilateral leg pain and feet pain. Dx with neuropathy. Pain is at a 10. States legs are numb, can not feel gas pedal driving. Gotten worse from yesterday to today. Thanks!

## 2019-03-31 NOTE — Telephone Encounter (Signed)
Spoke with steve in Patient care, there schedule is full. No availabity this week

## 2019-03-31 NOTE — Telephone Encounter (Signed)
Please advise 

## 2019-03-31 NOTE — Telephone Encounter (Signed)
We can increase duloxetine to 60mg  to help with pain; none of the medications can treat numbness, so unfortunately, this will not respond to anything I offer.  Regarding driving, if he cannot feet the pedals, recommend not to drive as it is a safety risk.  He is scheduled for IV steroids next Monday, please call patient center and see if he can be scheduled sooner. Thanks.

## 2019-03-31 NOTE — Telephone Encounter (Signed)
Patient notified , will call patient care center, pt is driving, recommended he not drive if he is having this symptoms

## 2019-04-01 ENCOUNTER — Telehealth: Payer: Self-pay | Admitting: Neurology

## 2019-04-01 NOTE — Telephone Encounter (Signed)
Left message at patient care for Richardson Landry the manager to call office back

## 2019-04-01 NOTE — Telephone Encounter (Signed)
Patient left a msg with after hours about needing the prior auth for the outpatient testing done next week but the office is needing to complete this. Patient was calling in checking on the completion. Thanks!

## 2019-04-04 ENCOUNTER — Other Ambulatory Visit: Payer: Self-pay

## 2019-04-04 ENCOUNTER — Ambulatory Visit (HOSPITAL_COMMUNITY)
Admission: RE | Admit: 2019-04-04 | Discharge: 2019-04-04 | Disposition: A | Discharge status: Home / Self Care | Payer: 59 | Source: Ambulatory Visit | Attending: Cardiovascular Disease | Admitting: Cardiovascular Disease

## 2019-04-04 DIAGNOSIS — I739 Peripheral vascular disease, unspecified: Secondary | ICD-10-CM | POA: Diagnosis not present

## 2019-04-07 ENCOUNTER — Ambulatory Visit (HOSPITAL_COMMUNITY)
Admission: RE | Admit: 2019-04-07 | Discharge: 2019-04-07 | Disposition: A | Discharge status: Home / Self Care | Payer: 59 | Source: Ambulatory Visit | Attending: Internal Medicine | Admitting: Internal Medicine

## 2019-04-07 ENCOUNTER — Telehealth: Payer: Self-pay

## 2019-04-07 ENCOUNTER — Other Ambulatory Visit: Payer: Self-pay

## 2019-04-07 DIAGNOSIS — G6181 Chronic inflammatory demyelinating polyneuritis: Secondary | ICD-10-CM | POA: Diagnosis not present

## 2019-04-07 MED ORDER — SODIUM CHLORIDE 0.9 % IV SOLN
1000.0000 mg | Freq: Once | INTRAVENOUS | Status: AC
  Administered 2019-04-07: 09:00:00 1000 mg via INTRAVENOUS
  Filled 2019-04-07: qty 8

## 2019-04-07 MED ORDER — SODIUM CHLORIDE 0.9 % IV SOLN
INTRAVENOUS | Status: DC | PRN
  Administered 2019-04-07: 250 mL via INTRAVENOUS

## 2019-04-07 NOTE — Telephone Encounter (Signed)
Patient is at patient care and His blood pressure is 157/98, 160/100... They need to know if to go ahead and administer solumderol as instructed. To call ashley back at [phone]

## 2019-04-07 NOTE — Telephone Encounter (Signed)
OK to proceed.  Monitor BP during infusion.

## 2019-04-07 NOTE — Discharge Instructions (Signed)
Methylprednisolone Solution for Injection What is this medicine? METHYLPREDNISOLONE (meth ill pred NISS oh lone) is a corticosteroid. It is commonly used to treat inflammation of the skin, joints, lungs, and other organs. Common conditions treated include asthma, allergies, and arthritis. It is also used for other conditions, such as blood disorders and diseases of the adrenal glands. This medicine may be used for other purposes; ask your health care provider or pharmacist if you have questions. COMMON BRAND NAME(S): A-Methapred, Solu-Medrol What should I tell my health care provider before I take this medicine? They need to know if you have any of these conditions:  Cushing's syndrome  eye disease, vision problems  diabetes  glaucoma  heart disease  high blood pressure  infection (especially a virus infection such as chickenpox, cold sores, or herpes)  liver disease  mental illness  myasthenia gravis  osteoporosis  recently received or scheduled to receive a vaccine  seizures  stomach or intestine problems  thyroid disease  an unusual or allergic reaction to lactose, methylprednisolone, other medicines, foods, dyes, or preservatives  pregnant or trying to get pregnant  breast-feeding How should I use this medicine? This medicine is for injection or infusion into a vein. It is also for injection into a muscle. It is given by a health care professional in a hospital or clinic setting. Talk to your pediatrician regarding the use of this medicine in children. While this drug may be prescribed for selected conditions, precautions do apply. Overdosage: If you think you have taken too much of this medicine contact a poison control center or emergency room at once. NOTE: This medicine is only for you. Do not share this medicine with others. What if I miss a dose? This does not apply. What may interact with this medicine? Do not take this medicine with any of the following  medications:  alefacept  echinacea  iopamidol  live virus vaccines  metyrapone  mifepristone This medicine may also interact with the following medications:  amphotericin B  aspirin and aspirin-like medicines  certain antibiotics like erythromycin, clarithromycin, troleandomycin  certain medicines for diabetes  certain medicines for fungal infection like ketoconazole  certain medicines for seizures like carbamazepine, phenobarbital, phenytoin  certain medicines that treat or prevent blood clots like warfarin  cyclosporine  digoxin  diuretics  male hormones, like estrogens and birth control pills  isoniazid  NSAIDS, medicines for pain and inflammation, like ibuprofen or naproxen  other medicines for myasthenia gravis  rifampin  vaccines This list may not describe all possible interactions. Give your health care provider a list of all the medicines, herbs, non-prescription drugs, or dietary supplements you use. Also tell them if you smoke, drink alcohol, or use illegal drugs. Some items may interact with your medicine. What should I watch for while using this medicine? Tell your doctor or healthcare professional if your symptoms do not start to get better or if they get worse. Do not stop taking except on your doctor's advice. You may develop a severe reaction. Your doctor will tell you how much medicine to take. Your condition will be monitored carefully while you are receiving this medicine. This medicine may increase your risk of getting an infection. Tell your doctor or health care professional if you are around anyone with measles or chickenpox, or if you develop sores or blisters that do not heal properly. This medicine may increase blood sugar. Ask your healthcare provider if changes in diet or medicines are needed if you have diabetes. Tell  your doctor or health care professional right away if you have any change in your eyesight. Using this medicine for a  long time may increase your risk of low bone mass. Talk to your doctor about bone health. What side effects may I notice from receiving this medicine? Side effects that you should report to your doctor or health care professional as soon as possible:  allergic reactions like skin rash, itching or hives, swelling of the face, lips, or tongue  bloody or tarry stools  hallucination, loss of contact with reality  muscle cramps  muscle pain  palpitations  signs and symptoms of high blood sugar such as being more thirsty or hungry or having to urinate more than normal. You may also feel very tired or have blurry vision.  signs and symptoms of infection like fever or chills; cough; sore throat; pain or trouble passing urine  trouble passing urine Side effects that usually do not require medical attention (report to your doctor or health care professional if they continue or are bothersome):  changes in emotions or mood  constipation  diarrhea  excessive hair growth on the face or body  headache  nausea, vomiting  pain, redness, or irritation at site where injected  trouble sleeping  weight gain This list may not describe all possible side effects. Call your doctor for medical advice about side effects. You may report side effects to FDA at 1-800-FDA-1088. Where should I keep my medicine? This drug is given in a hospital or clinic and will not be stored at home. NOTE: This sheet is a summary. It may not cover all possible information. If you have questions about this medicine, talk to your doctor, pharmacist, or health care provider.  2020 Elsevier/Gold Standard (2018-01-10 09:12:19)

## 2019-04-07 NOTE — Progress Notes (Signed)
PATIENT CARE CENTER NOTE    Provider: Narda Amber, MD   Procedure: Solumedrol IV   Note: Patient received infusion of Solumedrol via PIV. Pre-infusion vital signs taken and BP elevated at 157/98. Dr. Serita Grit office notified and verbal order given to monitor patient and continue with Solumedrol infusion. Patient tolerated infusion well. BP remained elevated post-infusion. Patient advised to follow-up with primary care provider concerning elevated BP. Discharge instructions given. Patient to come back tomorrow for second infusion. Alert, oriented and ambulatory at discharge.

## 2019-04-08 ENCOUNTER — Encounter (HOSPITAL_COMMUNITY): Payer: 59

## 2019-04-08 ENCOUNTER — Ambulatory Visit (HOSPITAL_COMMUNITY)
Admission: RE | Admit: 2019-04-08 | Discharge: 2019-04-08 | Disposition: A | Discharge status: Home / Self Care | Payer: 59 | Source: Ambulatory Visit | Attending: Internal Medicine | Admitting: Internal Medicine

## 2019-04-08 DIAGNOSIS — G6181 Chronic inflammatory demyelinating polyneuritis: Secondary | ICD-10-CM | POA: Diagnosis not present

## 2019-04-08 MED ORDER — SODIUM CHLORIDE 0.9 % IV SOLN
1000.0000 mg | Freq: Once | INTRAVENOUS | Status: AC
  Administered 2019-04-08: 1000 mg via INTRAVENOUS
  Filled 2019-04-08: qty 8

## 2019-04-08 MED ORDER — SODIUM CHLORIDE 0.9 % IV SOLN
INTRAVENOUS | Status: DC | PRN
  Administered 2019-04-08: 250 mL via INTRAVENOUS

## 2019-04-08 NOTE — Progress Notes (Signed)
PATIENT CARE CENTER NOTE    Provider: Narda Amber, MD   Procedure: Solumedrol IV   Note: Patient received infusion of Solumedrol via PIV. Patient tolerated infusion well. Vital signs wnl. Discharge instructions given. Patient to come back tomorrow for third infusion. Alert, oriented and ambulatory at discharge.

## 2019-04-08 NOTE — Discharge Instructions (Signed)
Methylprednisolone Solution for Injection What is this medicine? METHYLPREDNISOLONE (meth ill pred NISS oh lone) is a corticosteroid. It is commonly used to treat inflammation of the skin, joints, lungs, and other organs. Common conditions treated include asthma, allergies, and arthritis. It is also used for other conditions, such as blood disorders and diseases of the adrenal glands. This medicine may be used for other purposes; ask your health care provider or pharmacist if you have questions. COMMON BRAND NAME(S): A-Methapred, Solu-Medrol What should I tell my health care provider before I take this medicine? They need to know if you have any of these conditions:  Cushing's syndrome  eye disease, vision problems  diabetes  glaucoma  heart disease  high blood pressure  infection (especially a virus infection such as chickenpox, cold sores, or herpes)  liver disease  mental illness  myasthenia gravis  osteoporosis  recently received or scheduled to receive a vaccine  seizures  stomach or intestine problems  thyroid disease  an unusual or allergic reaction to lactose, methylprednisolone, other medicines, foods, dyes, or preservatives  pregnant or trying to get pregnant  breast-feeding How should I use this medicine? This medicine is for injection or infusion into a vein. It is also for injection into a muscle. It is given by a health care professional in a hospital or clinic setting. Talk to your pediatrician regarding the use of this medicine in children. While this drug may be prescribed for selected conditions, precautions do apply. Overdosage: If you think you have taken too much of this medicine contact a poison control center or emergency room at once. NOTE: This medicine is only for you. Do not share this medicine with others. What if I miss a dose? This does not apply. What may interact with this medicine? Do not take this medicine with any of the following  medications:  alefacept  echinacea  iopamidol  live virus vaccines  metyrapone  mifepristone This medicine may also interact with the following medications:  amphotericin B  aspirin and aspirin-like medicines  certain antibiotics like erythromycin, clarithromycin, troleandomycin  certain medicines for diabetes  certain medicines for fungal infection like ketoconazole  certain medicines for seizures like carbamazepine, phenobarbital, phenytoin  certain medicines that treat or prevent blood clots like warfarin  cyclosporine  digoxin  diuretics  male hormones, like estrogens and birth control pills  isoniazid  NSAIDS, medicines for pain and inflammation, like ibuprofen or naproxen  other medicines for myasthenia gravis  rifampin  vaccines This list may not describe all possible interactions. Give your health care provider a list of all the medicines, herbs, non-prescription drugs, or dietary supplements you use. Also tell them if you smoke, drink alcohol, or use illegal drugs. Some items may interact with your medicine. What should I watch for while using this medicine? Tell your doctor or healthcare professional if your symptoms do not start to get better or if they get worse. Do not stop taking except on your doctor's advice. You may develop a severe reaction. Your doctor will tell you how much medicine to take. Your condition will be monitored carefully while you are receiving this medicine. This medicine may increase your risk of getting an infection. Tell your doctor or health care professional if you are around anyone with measles or chickenpox, or if you develop sores or blisters that do not heal properly. This medicine may increase blood sugar. Ask your healthcare provider if changes in diet or medicines are needed if you have diabetes. Tell  your doctor or health care professional right away if you have any change in your eyesight. Using this medicine for a  long time may increase your risk of low bone mass. Talk to your doctor about bone health. What side effects may I notice from receiving this medicine? Side effects that you should report to your doctor or health care professional as soon as possible:  allergic reactions like skin rash, itching or hives, swelling of the face, lips, or tongue  bloody or tarry stools  hallucination, loss of contact with reality  muscle cramps  muscle pain  palpitations  signs and symptoms of high blood sugar such as being more thirsty or hungry or having to urinate more than normal. You may also feel very tired or have blurry vision.  signs and symptoms of infection like fever or chills; cough; sore throat; pain or trouble passing urine  trouble passing urine Side effects that usually do not require medical attention (report to your doctor or health care professional if they continue or are bothersome):  changes in emotions or mood  constipation  diarrhea  excessive hair growth on the face or body  headache  nausea, vomiting  pain, redness, or irritation at site where injected  trouble sleeping  weight gain This list may not describe all possible side effects. Call your doctor for medical advice about side effects. You may report side effects to FDA at 1-800-FDA-1088. Where should I keep my medicine? This drug is given in a hospital or clinic and will not be stored at home. NOTE: This sheet is a summary. It may not cover all possible information. If you have questions about this medicine, talk to your doctor, pharmacist, or health care provider.  2020 Elsevier/Gold Standard (2018-01-10 09:12:19)

## 2019-04-09 ENCOUNTER — Other Ambulatory Visit: Payer: Self-pay

## 2019-04-09 ENCOUNTER — Ambulatory Visit (HOSPITAL_COMMUNITY)
Admission: RE | Admit: 2019-04-09 | Discharge: 2019-04-09 | Disposition: A | Discharge status: Home / Self Care | Payer: 59 | Source: Ambulatory Visit | Attending: Internal Medicine | Admitting: Internal Medicine

## 2019-04-09 DIAGNOSIS — G6181 Chronic inflammatory demyelinating polyneuritis: Secondary | ICD-10-CM | POA: Diagnosis not present

## 2019-04-09 MED ORDER — SODIUM CHLORIDE 0.9 % IV SOLN
INTRAVENOUS | Status: DC | PRN
  Administered 2019-04-09: 250 mL via INTRAVENOUS

## 2019-04-09 MED ORDER — SODIUM CHLORIDE 0.9 % IV SOLN
1000.0000 mg | Freq: Once | INTRAVENOUS | Status: AC
  Administered 2019-04-09: 1000 mg via INTRAVENOUS
  Filled 2019-04-09: qty 8

## 2019-04-09 NOTE — Discharge Instructions (Signed)
Methylprednisolone Solution for Injection What is this medicine? METHYLPREDNISOLONE (meth ill pred NISS oh lone) is a corticosteroid. It is commonly used to treat inflammation of the skin, joints, lungs, and other organs. Common conditions treated include asthma, allergies, and arthritis. It is also used for other conditions, such as blood disorders and diseases of the adrenal glands. This medicine may be used for other purposes; ask your health care provider or pharmacist if you have questions. COMMON BRAND NAME(S): A-Methapred, Solu-Medrol What should I tell my health care provider before I take this medicine? They need to know if you have any of these conditions:  Cushing's syndrome  eye disease, vision problems  diabetes  glaucoma  heart disease  high blood pressure  infection (especially a virus infection such as chickenpox, cold sores, or herpes)  liver disease  mental illness  myasthenia gravis  osteoporosis  recently received or scheduled to receive a vaccine  seizures  stomach or intestine problems  thyroid disease  an unusual or allergic reaction to lactose, methylprednisolone, other medicines, foods, dyes, or preservatives  pregnant or trying to get pregnant  breast-feeding How should I use this medicine? This medicine is for injection or infusion into a vein. It is also for injection into a muscle. It is given by a health care professional in a hospital or clinic setting. Talk to your pediatrician regarding the use of this medicine in children. While this drug may be prescribed for selected conditions, precautions do apply. Overdosage: If you think you have taken too much of this medicine contact a poison control center or emergency room at once. NOTE: This medicine is only for you. Do not share this medicine with others. What if I miss a dose? This does not apply. What may interact with this medicine? Do not take this medicine with any of the following  medications:  alefacept  echinacea  iopamidol  live virus vaccines  metyrapone  mifepristone This medicine may also interact with the following medications:  amphotericin B  aspirin and aspirin-like medicines  certain antibiotics like erythromycin, clarithromycin, troleandomycin  certain medicines for diabetes  certain medicines for fungal infection like ketoconazole  certain medicines for seizures like carbamazepine, phenobarbital, phenytoin  certain medicines that treat or prevent blood clots like warfarin  cyclosporine  digoxin  diuretics  male hormones, like estrogens and birth control pills  isoniazid  NSAIDS, medicines for pain and inflammation, like ibuprofen or naproxen  other medicines for myasthenia gravis  rifampin  vaccines This list may not describe all possible interactions. Give your health care provider a list of all the medicines, herbs, non-prescription drugs, or dietary supplements you use. Also tell them if you smoke, drink alcohol, or use illegal drugs. Some items may interact with your medicine. What should I watch for while using this medicine? Tell your doctor or healthcare professional if your symptoms do not start to get better or if they get worse. Do not stop taking except on your doctor's advice. You may develop a severe reaction. Your doctor will tell you how much medicine to take. Your condition will be monitored carefully while you are receiving this medicine. This medicine may increase your risk of getting an infection. Tell your doctor or health care professional if you are around anyone with measles or chickenpox, or if you develop sores or blisters that do not heal properly. This medicine may increase blood sugar. Ask your healthcare provider if changes in diet or medicines are needed if you have diabetes. Tell  your doctor or health care professional right away if you have any change in your eyesight. Using this medicine for a  long time may increase your risk of low bone mass. Talk to your doctor about bone health. What side effects may I notice from receiving this medicine? Side effects that you should report to your doctor or health care professional as soon as possible:  allergic reactions like skin rash, itching or hives, swelling of the face, lips, or tongue  bloody or tarry stools  hallucination, loss of contact with reality  muscle cramps  muscle pain  palpitations  signs and symptoms of high blood sugar such as being more thirsty or hungry or having to urinate more than normal. You may also feel very tired or have blurry vision.  signs and symptoms of infection like fever or chills; cough; sore throat; pain or trouble passing urine  trouble passing urine Side effects that usually do not require medical attention (report to your doctor or health care professional if they continue or are bothersome):  changes in emotions or mood  constipation  diarrhea  excessive hair growth on the face or body  headache  nausea, vomiting  pain, redness, or irritation at site where injected  trouble sleeping  weight gain This list may not describe all possible side effects. Call your doctor for medical advice about side effects. You may report side effects to FDA at 1-800-FDA-1088. Where should I keep my medicine? This drug is given in a hospital or clinic and will not be stored at home. NOTE: This sheet is a summary. It may not cover all possible information. If you have questions about this medicine, talk to your doctor, pharmacist, or health care provider.  2020 Elsevier/Gold Standard (2018-01-10 09:12:19)

## 2019-04-09 NOTE — Progress Notes (Signed)
                   Patient Care Center Note    Provider: Narda Amber, MD   Procedure: Solumedrol IV   Note: Patient received Solumedrol IV via PIV. Patient tolerated well. Vitals signs stable. Discharge instructions given. Patient alert, oriented and ambulatory at discharge.     Otho Bellows, RN

## 2019-04-10 ENCOUNTER — Ambulatory Visit (HOSPITAL_COMMUNITY)
Admission: RE | Admit: 2019-04-10 | Discharge: 2019-04-10 | Disposition: A | Discharge status: Home / Self Care | Payer: 59 | Source: Ambulatory Visit | Attending: Neurology | Admitting: Neurology

## 2019-04-10 ENCOUNTER — Telehealth (HOSPITAL_COMMUNITY): Payer: Self-pay | Admitting: *Deleted

## 2019-04-10 DIAGNOSIS — I739 Peripheral vascular disease, unspecified: Secondary | ICD-10-CM | POA: Insufficient documentation

## 2019-04-10 MED ORDER — SODIUM CHLORIDE 0.9 % IV SOLN
INTRAVENOUS | Status: DC | PRN
  Administered 2019-04-10: 250 mL via INTRAVENOUS

## 2019-04-10 MED ORDER — SODIUM CHLORIDE 0.9 % IV SOLN
1000.0000 mg | Freq: Once | INTRAVENOUS | Status: AC
  Administered 2019-04-10: 1000 mg via INTRAVENOUS
  Filled 2019-04-10: qty 8

## 2019-04-10 NOTE — Discharge Instructions (Signed)
Methylprednisolone Solution for Injection What is this medicine? METHYLPREDNISOLONE (meth ill pred NISS oh lone) is a corticosteroid. It is commonly used to treat inflammation of the skin, joints, lungs, and other organs. Common conditions treated include asthma, allergies, and arthritis. It is also used for other conditions, such as blood disorders and diseases of the adrenal glands. This medicine may be used for other purposes; ask your health care provider or pharmacist if you have questions. COMMON BRAND NAME(S): A-Methapred, Solu-Medrol What should I tell my health care provider before I take this medicine? They need to know if you have any of these conditions:  Cushing's syndrome  eye disease, vision problems  diabetes  glaucoma  heart disease  high blood pressure  infection (especially a virus infection such as chickenpox, cold sores, or herpes)  liver disease  mental illness  myasthenia gravis  osteoporosis  recently received or scheduled to receive a vaccine  seizures  stomach or intestine problems  thyroid disease  an unusual or allergic reaction to lactose, methylprednisolone, other medicines, foods, dyes, or preservatives  pregnant or trying to get pregnant  breast-feeding How should I use this medicine? This medicine is for injection or infusion into a vein. It is also for injection into a muscle. It is given by a health care professional in a hospital or clinic setting. Talk to your pediatrician regarding the use of this medicine in children. While this drug may be prescribed for selected conditions, precautions do apply. Overdosage: If you think you have taken too much of this medicine contact a poison control center or emergency room at once. NOTE: This medicine is only for you. Do not share this medicine with others. What if I miss a dose? This does not apply. What may interact with this medicine? Do not take this medicine with any of the following  medications:  alefacept  echinacea  iopamidol  live virus vaccines  metyrapone  mifepristone This medicine may also interact with the following medications:  amphotericin B  aspirin and aspirin-like medicines  certain antibiotics like erythromycin, clarithromycin, troleandomycin  certain medicines for diabetes  certain medicines for fungal infection like ketoconazole  certain medicines for seizures like carbamazepine, phenobarbital, phenytoin  certain medicines that treat or prevent blood clots like warfarin  cyclosporine  digoxin  diuretics  male hormones, like estrogens and birth control pills  isoniazid  NSAIDS, medicines for pain and inflammation, like ibuprofen or naproxen  other medicines for myasthenia gravis  rifampin  vaccines This list may not describe all possible interactions. Give your health care provider a list of all the medicines, herbs, non-prescription drugs, or dietary supplements you use. Also tell them if you smoke, drink alcohol, or use illegal drugs. Some items may interact with your medicine. What should I watch for while using this medicine? Tell your doctor or healthcare professional if your symptoms do not start to get better or if they get worse. Do not stop taking except on your doctor's advice. You may develop a severe reaction. Your doctor will tell you how much medicine to take. Your condition will be monitored carefully while you are receiving this medicine. This medicine may increase your risk of getting an infection. Tell your doctor or health care professional if you are around anyone with measles or chickenpox, or if you develop sores or blisters that do not heal properly. This medicine may increase blood sugar. Ask your healthcare provider if changes in diet or medicines are needed if you have diabetes. Tell  your doctor or health care professional right away if you have any change in your eyesight. Using this medicine for a  long time may increase your risk of low bone mass. Talk to your doctor about bone health. What side effects may I notice from receiving this medicine? Side effects that you should report to your doctor or health care professional as soon as possible:  allergic reactions like skin rash, itching or hives, swelling of the face, lips, or tongue  bloody or tarry stools  hallucination, loss of contact with reality  muscle cramps  muscle pain  palpitations  signs and symptoms of high blood sugar such as being more thirsty or hungry or having to urinate more than normal. You may also feel very tired or have blurry vision.  signs and symptoms of infection like fever or chills; cough; sore throat; pain or trouble passing urine  trouble passing urine Side effects that usually do not require medical attention (report to your doctor or health care professional if they continue or are bothersome):  changes in emotions or mood  constipation  diarrhea  excessive hair growth on the face or body  headache  nausea, vomiting  pain, redness, or irritation at site where injected  trouble sleeping  weight gain This list may not describe all possible side effects. Call your doctor for medical advice about side effects. You may report side effects to FDA at 1-800-FDA-1088. Where should I keep my medicine? This drug is given in a hospital or clinic and will not be stored at home. NOTE: This sheet is a summary. It may not cover all possible information. If you have questions about this medicine, talk to your doctor, pharmacist, or health care provider.  2020 Elsevier/Gold Standard (2018-01-10 09:12:19)

## 2019-04-10 NOTE — Telephone Encounter (Signed)
Patient given detailed instructions per Myocardial Perfusion Study Information Sheet for the test on 04/14/19 at 8:15. Patient notified to arrive 15 minutes early and that it is imperative to arrive on time for appointment to keep from having the test rescheduled.  If you need to cancel or reschedule your appointment, please call the office within 24 hours of your appointment. . Patient verbalized understanding.[name]

## 2019-04-10 NOTE — Progress Notes (Signed)
[name] received IV Solu-medrol as ordered by Patel Donika MD. Tolerated well, vitals stable, discharge instructions given, verbalized understanding. [name] alert, oriented and ambulatory at the time of discharge.  

## 2019-04-11 ENCOUNTER — Ambulatory Visit (HOSPITAL_COMMUNITY)
Admission: RE | Admit: 2019-04-11 | Discharge: 2019-04-11 | Disposition: A | Discharge status: Home / Self Care | Payer: 59 | Source: Ambulatory Visit | Attending: Internal Medicine | Admitting: Internal Medicine

## 2019-04-11 ENCOUNTER — Other Ambulatory Visit: Payer: Self-pay

## 2019-04-11 DIAGNOSIS — G6181 Chronic inflammatory demyelinating polyneuritis: Secondary | ICD-10-CM | POA: Diagnosis not present

## 2019-04-11 MED ORDER — SODIUM CHLORIDE 0.9 % IV SOLN
1000.0000 mg | Freq: Once | INTRAVENOUS | Status: AC
  Administered 2019-04-11: 1000 mg via INTRAVENOUS
  Filled 2019-04-11: qty 8

## 2019-04-11 MED ORDER — SODIUM CHLORIDE 0.9 % IV SOLN
INTRAVENOUS | Status: DC | PRN
  Administered 2019-04-11: 09:00:00 250 mL via INTRAVENOUS

## 2019-04-11 NOTE — Discharge Instructions (Signed)
Methylprednisolone Solution for Injection What is this medicine? METHYLPREDNISOLONE (meth ill pred NISS oh lone) is a corticosteroid. It is commonly used to treat inflammation of the skin, joints, lungs, and other organs. Common conditions treated include asthma, allergies, and arthritis. It is also used for other conditions, such as blood disorders and diseases of the adrenal glands. This medicine may be used for other purposes; ask your health care provider or pharmacist if you have questions. COMMON BRAND NAME(S): A-Methapred, Solu-Medrol What should I tell my health care provider before I take this medicine? They need to know if you have any of these conditions:  Cushing's syndrome  eye disease, vision problems  diabetes  glaucoma  heart disease  high blood pressure  infection (especially a virus infection such as chickenpox, cold sores, or herpes)  liver disease  mental illness  myasthenia gravis  osteoporosis  recently received or scheduled to receive a vaccine  seizures  stomach or intestine problems  thyroid disease  an unusual or allergic reaction to lactose, methylprednisolone, other medicines, foods, dyes, or preservatives  pregnant or trying to get pregnant  breast-feeding How should I use this medicine? This medicine is for injection or infusion into a vein. It is also for injection into a muscle. It is given by a health care professional in a hospital or clinic setting. Talk to your pediatrician regarding the use of this medicine in children. While this drug may be prescribed for selected conditions, precautions do apply. Overdosage: If you think you have taken too much of this medicine contact a poison control center or emergency room at once. NOTE: This medicine is only for you. Do not share this medicine with others. What if I miss a dose? This does not apply. What may interact with this medicine? Do not take this medicine with any of the following  medications:  alefacept  echinacea  iopamidol  live virus vaccines  metyrapone  mifepristone This medicine may also interact with the following medications:  amphotericin B  aspirin and aspirin-like medicines  certain antibiotics like erythromycin, clarithromycin, troleandomycin  certain medicines for diabetes  certain medicines for fungal infection like ketoconazole  certain medicines for seizures like carbamazepine, phenobarbital, phenytoin  certain medicines that treat or prevent blood clots like warfarin  cyclosporine  digoxin  diuretics  male hormones, like estrogens and birth control pills  isoniazid  NSAIDS, medicines for pain and inflammation, like ibuprofen or naproxen  other medicines for myasthenia gravis  rifampin  vaccines This list may not describe all possible interactions. Give your health care provider a list of all the medicines, herbs, non-prescription drugs, or dietary supplements you use. Also tell them if you smoke, drink alcohol, or use illegal drugs. Some items may interact with your medicine. What should I watch for while using this medicine? Tell your doctor or healthcare professional if your symptoms do not start to get better or if they get worse. Do not stop taking except on your doctor's advice. You may develop a severe reaction. Your doctor will tell you how much medicine to take. Your condition will be monitored carefully while you are receiving this medicine. This medicine may increase your risk of getting an infection. Tell your doctor or health care professional if you are around anyone with measles or chickenpox, or if you develop sores or blisters that do not heal properly. This medicine may increase blood sugar. Ask your healthcare provider if changes in diet or medicines are needed if you have diabetes. Tell  your doctor or health care professional right away if you have any change in your eyesight. Using this medicine for a  long time may increase your risk of low bone mass. Talk to your doctor about bone health. What side effects may I notice from receiving this medicine? Side effects that you should report to your doctor or health care professional as soon as possible:  allergic reactions like skin rash, itching or hives, swelling of the face, lips, or tongue  bloody or tarry stools  hallucination, loss of contact with reality  muscle cramps  muscle pain  palpitations  signs and symptoms of high blood sugar such as being more thirsty or hungry or having to urinate more than normal. You may also feel very tired or have blurry vision.  signs and symptoms of infection like fever or chills; cough; sore throat; pain or trouble passing urine  trouble passing urine Side effects that usually do not require medical attention (report to your doctor or health care professional if they continue or are bothersome):  changes in emotions or mood  constipation  diarrhea  excessive hair growth on the face or body  headache  nausea, vomiting  pain, redness, or irritation at site where injected  trouble sleeping  weight gain This list may not describe all possible side effects. Call your doctor for medical advice about side effects. You may report side effects to FDA at 1-800-FDA-1088. Where should I keep my medicine? This drug is given in a hospital or clinic and will not be stored at home. NOTE: This sheet is a summary. It may not cover all possible information. If you have questions about this medicine, talk to your doctor, pharmacist, or health care provider.  2020 Elsevier/Gold Standard (2018-01-10 09:12:19)

## 2019-04-11 NOTE — Progress Notes (Signed)
PATIENT CARE CENTER NOTE    Provider:Patel, Arvin Collard, MD   Procedure:Solumedrol IV   Note:Patient received infusion of Solumedrol via PIV. Patient tolerated infusion well. BP elevated pre and post-infusion. Patient to follow-up with with PCP. Discharge instructions given. Alert, oriented and ambulatory at discharge.

## 2019-04-14 ENCOUNTER — Ambulatory Visit (HOSPITAL_COMMUNITY): Payer: 59 | Attending: Cardiology

## 2019-04-14 ENCOUNTER — Other Ambulatory Visit: Payer: Self-pay

## 2019-04-14 VITALS — Ht 70.5 in | Wt 225.0 lb

## 2019-04-14 DIAGNOSIS — I2510 Atherosclerotic heart disease of native coronary artery without angina pectoris: Secondary | ICD-10-CM

## 2019-04-14 DIAGNOSIS — R0602 Shortness of breath: Secondary | ICD-10-CM | POA: Diagnosis present

## 2019-04-14 DIAGNOSIS — Z95820 Peripheral vascular angioplasty status with implants and grafts: Secondary | ICD-10-CM | POA: Insufficient documentation

## 2019-04-14 DIAGNOSIS — R110 Nausea: Secondary | ICD-10-CM | POA: Insufficient documentation

## 2019-04-14 DIAGNOSIS — I10 Essential (primary) hypertension: Secondary | ICD-10-CM | POA: Insufficient documentation

## 2019-04-14 DIAGNOSIS — R0609 Other forms of dyspnea: Secondary | ICD-10-CM

## 2019-04-14 DIAGNOSIS — R0600 Dyspnea, unspecified: Secondary | ICD-10-CM | POA: Insufficient documentation

## 2019-04-14 LAB — MYOCARDIAL PERFUSION IMAGING
LV dias vol: 72 mL (ref 62–150)
LV sys vol: 42 mL
Peak HR: 101 {beats}/min
Rest HR: 90 {beats}/min
SDS: 2
SRS: 0
SSS: 2
TID: 0.97

## 2019-04-14 MED ORDER — AMINOPHYLLINE 25 MG/ML IV SOLN
75.0000 mg | Freq: Once | INTRAVENOUS | Status: AC
  Administered 2019-04-14: 75 mg via INTRAVENOUS

## 2019-04-14 MED ORDER — REGADENOSON 0.4 MG/5ML IV SOLN
0.4000 mg | Freq: Once | INTRAVENOUS | Status: AC
  Administered 2019-04-14: 0.4 mg via INTRAVENOUS

## 2019-04-14 MED ORDER — TECHNETIUM TC 99M TETROFOSMIN IV KIT
32.8000 | PACK | Freq: Once | INTRAVENOUS | Status: AC | PRN
  Administered 2019-04-14: 32.8 via INTRAVENOUS
  Filled 2019-04-14: qty 33

## 2019-04-14 MED ORDER — TECHNETIUM TC 99M TETROFOSMIN IV KIT
10.1000 | PACK | Freq: Once | INTRAVENOUS | Status: AC | PRN
  Administered 2019-04-14: 10.1 via INTRAVENOUS
  Filled 2019-04-14: qty 11

## 2019-04-17 ENCOUNTER — Other Ambulatory Visit: Payer: Self-pay | Admitting: Family Medicine

## 2019-04-21 ENCOUNTER — Telehealth: Payer: Self-pay | Admitting: Cardiology

## 2019-04-21 DIAGNOSIS — I2583 Coronary atherosclerosis due to lipid rich plaque: Secondary | ICD-10-CM

## 2019-04-21 DIAGNOSIS — I5020 Unspecified systolic (congestive) heart failure: Secondary | ICD-10-CM

## 2019-04-21 DIAGNOSIS — Z95820 Peripheral vascular angioplasty status with implants and grafts: Secondary | ICD-10-CM

## 2019-04-21 DIAGNOSIS — I5032 Chronic diastolic (congestive) heart failure: Secondary | ICD-10-CM

## 2019-04-21 DIAGNOSIS — I2510 Atherosclerotic heart disease of native coronary artery without angina pectoris: Secondary | ICD-10-CM

## 2019-04-21 NOTE — Telephone Encounter (Signed)
Pt is aware of stress test results and recommendations per Dr. Meda Coffee, for him to have an echo done to re-evaluate his EF.  Informed the pt that I will place the order for the echo in the system, and send a message to our [facility] schedulers to call him back and arrange this appt.  Pt verbalized understanding and agrees with this plan.

## 2019-04-21 NOTE — Telephone Encounter (Signed)
New Message  Pt is calling back for results   Please call  

## 2019-04-21 NOTE — Telephone Encounter (Signed)
-----   Message from Dorothy Spark, MD sent at 04/21/2019 10:52 AM EST ----- His nuclear stress test shows an old scar but no ischemia, his LVEF appears to be decreased, previously normal, will order an echocardiogram to validate this.

## 2019-04-23 ENCOUNTER — Other Ambulatory Visit: Payer: Self-pay

## 2019-04-23 ENCOUNTER — Encounter: Payer: Self-pay | Admitting: Family Medicine

## 2019-04-23 ENCOUNTER — Ambulatory Visit: Payer: 59 | Admitting: Family Medicine

## 2019-04-23 ENCOUNTER — Ambulatory Visit (HOSPITAL_COMMUNITY): Payer: 59 | Attending: Cardiology

## 2019-04-23 ENCOUNTER — Ambulatory Visit: Payer: Self-pay

## 2019-04-23 VITALS — BP 140/94 | HR 95 | Temp 97.4°F | Ht 70.5 in | Wt 231.0 lb

## 2019-04-23 DIAGNOSIS — Z95820 Peripheral vascular angioplasty status with implants and grafts: Secondary | ICD-10-CM | POA: Diagnosis not present

## 2019-04-23 DIAGNOSIS — I5020 Unspecified systolic (congestive) heart failure: Secondary | ICD-10-CM

## 2019-04-23 DIAGNOSIS — I5032 Chronic diastolic (congestive) heart failure: Secondary | ICD-10-CM

## 2019-04-23 DIAGNOSIS — I2510 Atherosclerotic heart disease of native coronary artery without angina pectoris: Secondary | ICD-10-CM | POA: Diagnosis not present

## 2019-04-23 DIAGNOSIS — M792 Neuralgia and neuritis, unspecified: Secondary | ICD-10-CM

## 2019-04-23 DIAGNOSIS — I2583 Coronary atherosclerosis due to lipid rich plaque: Secondary | ICD-10-CM | POA: Diagnosis present

## 2019-04-23 DIAGNOSIS — K625 Hemorrhage of anus and rectum: Secondary | ICD-10-CM

## 2019-04-23 DIAGNOSIS — K644 Residual hemorrhoidal skin tags: Secondary | ICD-10-CM | POA: Diagnosis not present

## 2019-04-23 MED ORDER — HYDROCODONE-ACETAMINOPHEN 5-325 MG PO TABS: 2.0000 | ORAL_TABLET | Freq: Four times a day (QID) | ORAL | 0 refills | Status: AC | PRN

## 2019-04-23 NOTE — Patient Instructions (Addendum)
Call Dr. Barth Kirks for an appointment.   You do have an external hemorrhoid.   Give [place] 2-3 business days to get the results of your labs back.   Let us know if you need anything.

## 2019-04-23 NOTE — Progress Notes (Signed)
Chief Complaint  Patient presents with  . Rectal Bleeding    Subjective: Patient is a 58 y.o. male here for rectal bleeding.  Over the past month, the patient has had 3 episodes of bright red blood from his rectum.  This was not accompanied with stool.  He had a colonoscopy that showed polyps in September 2017.  His gastroenterologist is Dr. Fuller Plan.  He is not having any pain, unintentional weight loss, nausea, vomiting, or rectal itching.  There is no injury to his bottom.  He is on dual antiplatelet therapy.  He continues to have issues with neuropathic pain in both of his feet.  He is working with the neurology team to figure this out.  He is currently on nortriptyline and Lyrica.  He was recently prescribed Voltaren and Cymbalta by his neurologist.  It is causing him extreme pain.  Opiates have been helpful for as needed use in the past.   ROS: Constitutional: No unintentional weight loss Gastrointestinal: No diarrhea  Past Medical History:  Diagnosis Date  . BPH (benign prostatic hyperplasia)   . CAD (coronary artery disease)    a. NSTEMI troponin >65 with cath 11/2016 S/p PTCA & DES to mid circumflex. b. 05/2018 s/p DES to [facility].  Marland Kitchen Chronic chest pain   . Chronic combined systolic and diastolic CHF (congestive heart failure) ([place])    a. EF 45% in 2018. b. EF 55-60% by echo 01/2017. c. EF 48% by nuc 07/2017.  . CKD (chronic kidney disease), stage II   . Cluster headache    hx of  . Complication of anesthesia    difficult waking up"  . Diverticulosis   . Drug abuse ([place])    hx of  . GERD (gastroesophageal reflux disease)   . Hiatal hernia   . History of kidney stones   . Hyperlipidemia   . Hypertension   . Hypothyroidism   . Nontoxic multinodular goiter   . NSTEMI (non-ST elevated myocardial infarction) ([place]) 2018  . Perforated nasal septum   . Pulmonary nodule --- CT 10/19/2014: Nodule is stable, no further routine x-rays 01/06/2009  . Thrombocytopenia ([place])   . TIA  (transient ischemic attack)   . Trigeminal neuralgia   . Tubular adenoma of colon 12/2015  . Unspecified asthma(493.90)     Objective: BP (!) 140/94 (BP Location: Left Arm, Patient Position: Sitting, Cuff Size: Large)   Pulse 95   Temp (!) 97.4 F (36.3 C) (Temporal)   Ht 5' 10.5" (1.791 m)   Wt 231 lb (104.8 kg)   SpO2 93%   BMI 32.68 kg/m  General: Awake, appears stated age Rectal: No external fissures or wounds; there is a hernia postero-laterally on the left that is not thrombosed Lungs: No accessory muscle use Psych: Age appropriate judgment and insight, normal affect and mood  Assessment and Plan: External hemorrhoid  BRBPR (bright red blood per rectum) - Plan: CBC  Neuropathic pain - Plan: HYDROcodone-acetaminophen (NORCO/VICODIN) 5-325 MG tablet   Patient declined a digital rectal exam.  He did promise to contact his gastroenterologist for further management. I would check a CBC to make sure he is not anemic from GI loss. Prescribed as needed use of Vicodin.  Continue nortriptyline, Cymbalta, and Lyrica.  Because he is on Plavix and aspirin, I would like him to stop the Voltaren. Follow-up as needed regarding this. The patient voiced understanding and agreement to the plan.  [place], DO 04/23/19  4:38 PM

## 2019-04-23 NOTE — Telephone Encounter (Signed)
Called the patient to confirm problem and he has not been seen for this problem. Scheduled appointment today (04/23/19) at 3:45.  COVID screening completed.

## 2019-04-23 NOTE — Telephone Encounter (Signed)
Incoming call from Patient , with a complaint or rectal bleeding.  Reports that the toilet bowel is  Bright red in color. Denies diarrhea.  Onset  A few months ago. Denies constipation.   Has happened before. Denies pain.   States that he does take a blood thinner. Call to office to make appointment .  No availability. Recommended Patient go to ED or Urgent care .  Informed  Patient.   Voiced understanding.  Yet Patient states that he will call back for availability.  Does not wish to go to Urgent Care or ED.  States he would see any Provider.           Reason for Disposition . MODERATE rectal bleeding (small blood clots, passing blood without stool, or toilet water turns red)  Answer Assessment - Initial Assessment Questions 1. APPEARANCE of BLOOD: "What color is it?" "Is it passed separately, on the surface of the stool, or mixed in with the stool?"       tiolet bowel  Just blood  2. AMOUNT: "How much blood was passed?"      Only in toilet 3. FREQUENCY: "How many times has blood been passed with the stools?"       4. ONSET: "When was the blood first seen in the stools?" (Days or weeks)     Few months  5. DIARRHEA: "Is there also some diarrhea?" If so, ask: "How many diarrhea stools were passed in past 24 hours?"      Regular stools 6. CONSTIPATION: "Do you have constipation?" If so, "How bad is it?"     denies 7. RECURRENT SYMPTOMS: "Have you had blood in your stools before?" If so, ask: "When was the last time?" and "What happened that time?"      yes 8. BLOOD THINNERS: "Do you take any blood thinners?" (e.g., Coumadin/warfarin, Pradaxa/dabigatran, aspirin)    Yes  9. OTHER SYMPTOMS: "Do you have any other symptoms?"  (e.g., abdominal pain, vomiting, dizziness, fever)    denies 10. PREGNANCY: "Is there any chance you are pregnant?" "When was your last menstrual period?"       na  Protocols used: RECTAL BLEEDING-A-AH

## 2019-04-24 LAB — CBC
HCT: 36.2 % — ABNORMAL LOW (ref 39.0–52.0)
Hemoglobin: 11.9 g/dL — ABNORMAL LOW (ref 13.0–17.0)
MCHC: 32.9 g/dL (ref 30.0–36.0)
MCV: 106.3 fl — ABNORMAL HIGH (ref 78.0–100.0)
Platelets: 147 10*3/uL — ABNORMAL LOW (ref 150.0–400.0)
RBC: 3.41 Mil/uL — ABNORMAL LOW (ref 4.22–5.81)
RDW: 17.7 % — ABNORMAL HIGH (ref 11.5–15.5)
WBC: 6.2 10*3/uL (ref 4.0–10.5)

## 2019-04-29 ENCOUNTER — Other Ambulatory Visit (INDEPENDENT_AMBULATORY_CARE_PROVIDER_SITE_OTHER): Payer: 59

## 2019-04-29 ENCOUNTER — Telehealth: Payer: Self-pay

## 2019-04-29 ENCOUNTER — Encounter: Payer: Self-pay | Admitting: Gastroenterology

## 2019-04-29 ENCOUNTER — Ambulatory Visit (INDEPENDENT_AMBULATORY_CARE_PROVIDER_SITE_OTHER): Payer: 59 | Admitting: Gastroenterology

## 2019-04-29 DIAGNOSIS — Z7902 Long term (current) use of antithrombotics/antiplatelets: Secondary | ICD-10-CM | POA: Diagnosis not present

## 2019-04-29 DIAGNOSIS — Z1159 Encounter for screening for other viral diseases: Secondary | ICD-10-CM

## 2019-04-29 DIAGNOSIS — K921 Melena: Secondary | ICD-10-CM

## 2019-04-29 DIAGNOSIS — D539 Nutritional anemia, unspecified: Secondary | ICD-10-CM | POA: Diagnosis not present

## 2019-04-29 DIAGNOSIS — I2510 Atherosclerotic heart disease of native coronary artery without angina pectoris: Secondary | ICD-10-CM | POA: Diagnosis not present

## 2019-04-29 DIAGNOSIS — Z955 Presence of coronary angioplasty implant and graft: Secondary | ICD-10-CM

## 2019-04-29 DIAGNOSIS — Z86010 Personal history of colonic polyps: Secondary | ICD-10-CM

## 2019-04-29 DIAGNOSIS — Z860101 Personal history of adenomatous and serrated colon polyps: Secondary | ICD-10-CM

## 2019-04-29 DIAGNOSIS — Z8719 Personal history of other diseases of the digestive system: Secondary | ICD-10-CM | POA: Diagnosis not present

## 2019-04-29 LAB — FOLATE: Folate: 3.9 ng/mL — ABNORMAL LOW (ref >5.9)

## 2019-04-29 LAB — VITAMIN B12: Vitamin B-12: 668 pg/mL (ref 211–911)

## 2019-04-29 MED ORDER — SUPREP BOWEL PREP KIT 17.5-3.13-1.6 GM/177ML PO SOLN: 1.0000 | ORAL | 0 refills | Status: DC

## 2019-04-29 NOTE — Progress Notes (Signed)
    History of Present Illness: This is a 59 year old male with intermittent hematochezia with and without bowel movements for 2 months.  Symptoms occur about every 1 to 2 weeks.  He denies constipation straining or any change in bowel function other than the bleeding.  His PCP noted an external hemorrhoid.  Colonoscopy in 2017 outlined below.  Hgb=11.9, MCV=106, plts=147k.  Denies weight loss, abdominal pain, constipation, diarrhea, change in stool caliber, melena, nausea, vomiting, dysphagia, reflux symptoms, chest pain.  Colonoscopy 12/2015 - Five 5 to 8 mm polyps in the rectum, in the sigmoid colon and in the transverse colon, removed with a cold snare. Resected and retrieved. - Internal hemorrhoids. - Diverticulosis in the sigmoid colon and in the descending colon. - The examined portion of the ileum was normal. - Biopsies were taken with a cold forceps from the ascending colon, transverse colon, descending colon and sigmoid colon for evaluation of microscopic colitis. Path: 3 adenomas, 2 hyperplastic, normal colonic mucosa  EGD 05/2017 - No endoscopic esophageal abnormality to explain patient's dysphagia. Esophagus dilated. - Erythematous mucosa in the gastric fundus and gastric body. Biopsied. - Small hiatal hernia. - Normal duodenal bulb and second portion of the duodenum.  Current Medications, Allergies, Past Medical History, Past Surgical History, Family History and Social History were reviewed in [employer] record.   Physical Exam: General: Well developed, well nourished, no acute distress Head: Normocephalic and atraumatic Eyes:  sclerae anicteric, EOMI Ears: Normal auditory acuity Mouth: No deformity or lesions Lungs: Clear throughout to auscultation Heart: Regular rate and rhythm; no murmurs, rubs or bruits Abdomen: Soft, non tender and non distended. No masses, hepatosplenomegaly or hernias noted. Normal Bowel sounds Rectal: Pt decline so will defer  to colonoscopy  Musculoskeletal: Symmetrical with no gross deformities  Pulses:  Normal pulses noted Extremities: No clubbing, cyanosis, edema or deformities noted Neurological: Alert oriented x 4, grossly nonfocal Psychological:  Alert and cooperative. Normal mood and affect   Assessment and Recommendations:  1. Hematochezia, personal history of adenomatous colon polyps. R/O hemorrhoids, polyp, etc. Schedule colonoscopy. The risks (including bleeding, perforation, infection, missed lesions, medication reactions and possible hospitalization or surgery if complications occur), benefits, and alternatives to colonoscopy with possible biopsy and possible polypectomy were discussed with the patient and they consent to proceed.  Consider hemorrhoidal banding of hemorrhoids determined to be the etiology of bleeding.  2. Macrocytic anemia, slightly low platelets. B12, folate today.   3. CAD, S/P NSTEMI in 2018, S/P DES.  Hold Plavix 5 days before procedure - will instruct when and how to resume after procedure. Low but real risk of cardiovascular event such as heart attack, stroke, embolism, thrombosis or ischemia/infarct of other organs off Plavix explained and need to seek urgent help if this occurs. The patient consents to proceed. Will communicate by phone or EMR with patient's prescribing provider to confirm that holding Plavix is reasonable in this case. Continue ASA EC 81 mg qd.   4. GERD. Continue Prevacid 30 mg po bid. Follow antireflux measures.

## 2019-04-29 NOTE — Telephone Encounter (Signed)
[facility] Pre-operative Risk Assessment     Request for surgical clearance:     Endoscopy Procedure  What type of surgery is being performed?     Colonoscopy  When is this surgery scheduled?     05/06/19  What type of clearance is required ?   Pharmacy  Are there any medications that need to be held prior to surgery and how long? Plavix x 5 days  Practice name and name of physician performing surgery?      [facility]  What is your office phone and fax number?      Phone- [phone]  [phone]  Anesthesia type (None, local, MAC, general) ?       MAC

## 2019-04-29 NOTE — Addendum Note (Signed)
Addended by: Stevan Born on: 04/29/2019 03:30 PM   Modules accepted: Orders

## 2019-04-29 NOTE — Telephone Encounter (Signed)
Informed patient to hold Plavix 5 days prior to his procedure. Patient verbalized understanding.

## 2019-04-29 NOTE — Patient Instructions (Signed)
If you are age 57 or older, your body mass index should be between 23-30. Your Body mass index is 31.83 kg/m. If this is out of the aforementioned range listed, please consider follow up with your Primary Care Provider.  If you are age 65 or younger, your body mass index should be between 19-25. Your Body mass index is 31.83 kg/m. If this is out of the aformentioned range listed, please consider follow up with your Primary Care Provider.   You have been scheduled for a colonoscopy. Please follow written instructions given to you at your visit today.  Please pick up your prep supplies at the pharmacy within the next 1-3 days. If you use inhalers (even only as needed), please bring them with you on the day of your procedure. Your physician has requested that you go to www.startemmi.com and enter the access code given to you at your visit today. This web site gives a general overview about your procedure. However, you should still follow specific instructions given to you by our office regarding your preparation for the procedure.  Your provider has requested that you go to the basement level for lab work before leaving today. Press "B" on the elevator. The lab is located at the first door on the left as you exit the elevator.

## 2019-04-29 NOTE — Telephone Encounter (Signed)
   Primary Cardiologist: Ena Dawley, MD  Chart reviewed as part of pre-operative protocol coverage. He is being treated for CAD, Status post DES RCA 05/2018, Previously placed stent patent and no significant progression of residual disease on cath 4/1. He was seen last by Dr. Meda Coffee on 03/26/2019 and was stable. Given past medical history and time since last visit, based on ACC/AHA guidelines, Demichael Deguzman would be at acceptable risk for the planned procedure without further cardiovascular testing. Okay to hold Plavix for 5 days prior to EDG. Start again ASAP thereafter.   I will route this recommendation to the requesting party via Epic fax function and remove from pre-op pool.  Please call with questions.  Phill Myron. West Pugh, ANP, AACC  04/29/2019, 3:27 PM

## 2019-05-01 ENCOUNTER — Other Ambulatory Visit: Payer: Self-pay

## 2019-05-01 MED ORDER — FOLIC ACID 1 MG PO TABS: 1.0000 mg | ORAL_TABLET | Freq: Every day | ORAL | 11 refills | Status: AC

## 2019-05-01 NOTE — Progress Notes (Signed)
Folate

## 2019-05-02 ENCOUNTER — Ambulatory Visit (HOSPITAL_COMMUNITY): Payer: 59

## 2019-05-02 ENCOUNTER — Ambulatory Visit (INDEPENDENT_AMBULATORY_CARE_PROVIDER_SITE_OTHER): Payer: 59

## 2019-05-02 DIAGNOSIS — Z1159 Encounter for screening for other viral diseases: Secondary | ICD-10-CM

## 2019-05-05 LAB — SARS CORONAVIRUS 2 (TAT 6-24 HRS): SARS Coronavirus 2: NEGATIVE

## 2019-05-06 ENCOUNTER — Other Ambulatory Visit: Payer: Self-pay

## 2019-05-06 ENCOUNTER — Encounter: Payer: Self-pay | Admitting: Gastroenterology

## 2019-05-06 ENCOUNTER — Ambulatory Visit (AMBULATORY_SURGERY_CENTER): Payer: 59 | Admitting: Gastroenterology

## 2019-05-06 VITALS — BP 157/113 | HR 110 | Temp 97.8°F | Resp 18

## 2019-05-06 DIAGNOSIS — K921 Melena: Secondary | ICD-10-CM | POA: Diagnosis not present

## 2019-05-06 DIAGNOSIS — K5730 Diverticulosis of large intestine without perforation or abscess without bleeding: Secondary | ICD-10-CM | POA: Diagnosis not present

## 2019-05-06 DIAGNOSIS — K640 First degree hemorrhoids: Secondary | ICD-10-CM

## 2019-05-06 DIAGNOSIS — Z86010 Personal history of colonic polyps: Secondary | ICD-10-CM

## 2019-05-06 MED ORDER — SODIUM CHLORIDE 0.9 % IV SOLN: 500.0000 mL | Freq: Once | INTRAVENOUS | Status: DC

## 2019-05-06 NOTE — Progress Notes (Signed)
Pt's states no medical or surgical changes since previsit or office visit. 

## 2019-05-06 NOTE — Progress Notes (Signed)
Patient tackycardia while in Admitting. Heart rate 145. Dr. Fuller Plan and CRNA aware . Will procedure with procedure. Patient alert did not take medications today. Also patient stated no food for 2 days and no medicine he feel tied and weak.

## 2019-05-06 NOTE — Progress Notes (Signed)
To PACU, VSS. Report to RN.tb 

## 2019-05-06 NOTE — Op Note (Addendum)
[name] Procedure Date: 05/06/2019 10:04 AM MRN: [id number] Endoscopist: Ladene Artist , MD Age: 59 Referring MD:  Date of Birth: 1960/07/20 Gender: Male Account #: [id number] Procedure:                Colonoscopy Indications:              Hematochezia. Personal history of adenomatous colon                            polyps. Medicines:                Monitored Anesthesia Care Procedure:                Pre-Anesthesia Assessment:                           - Prior to the procedure, a History and Physical                            was performed, and patient medications and                            allergies were reviewed. The patient's tolerance of                            previous anesthesia was also reviewed. The risks                            and benefits of the procedure and the sedation                            options and risks were discussed with the patient.                            All questions were answered, and informed consent                            was obtained. Prior Anticoagulants: The patient has                            taken Plavix (clopidogrel), last dose was 5 days                            prior to procedure. ASA Grade Assessment: III - A                            patient with severe systemic disease. After                            reviewing the risks and benefits, the patient was                            deemed in satisfactory condition to undergo the  procedure.                           After obtaining informed consent, the colonoscope                            was passed under direct vision. Throughout the                            procedure, the patient's blood pressure, pulse, and                            oxygen saturations were monitored continuously. The                            Colonoscope was introduced through the anus and   advanced to the the cecum, identified by                            appendiceal orifice and ileocecal valve. The                            ileocecal valve, appendiceal orifice, and rectum                            were photographed. The quality of the bowel                            preparation was good. The colonoscopy was performed                            without difficulty. The patient tolerated the                            procedure well. Scope In: 10:15:19 AM Scope Out: 10:29:19 AM Scope Withdrawal Time: 0 hours 10 minutes 53 seconds  Total Procedure Duration: 0 hours 14 minutes 0 seconds  Findings:                 The perianal and digital rectal examinations were                            normal.                           Multiple small-mouthed diverticula were found in                            the left colon. There was no evidence of                            diverticular bleeding.                           Internal hemorrhoids were found during  retroflexion. The hemorrhoids were small and Grade                            I (internal hemorrhoids that do not prolapse).                           The exam was otherwise without abnormality on                            direct and retroflexion views. Complications:            No immediate complications. Estimated blood loss:                            None. Estimated Blood Loss:     Estimated blood loss: none. Impression:               - Mild diverticulosis in the left colon.                           - Internal hemorrhoids.                           - The examination was otherwise normal on direct                            and retroflexion views.                           - No specimens collected. Recommendation:           - Repeat colonoscopy in 7 years for surveillance.                           - Patient has a contact number available for                            emergencies. The signs  and symptoms of potential                            delayed complications were discussed with the                            patient. Return to normal activities tomorrow.                            Written discharge instructions were provided to the                            patient.                           - High fiber diet.                           - Continue present medications.                           -  Preparation H supp (OTC) bid prn hemorhoid                            symptoms.                           - Resume Plavix (clopidogrel) at prior dose today.                            Refer to managing physician for further adjustment                            of therapy. Ladene Artist, MD 05/06/2019 10:36:42 AM This report has been signed electronically.

## 2019-05-06 NOTE — Patient Instructions (Addendum)
Handouts given for hemorrhoids, diverticulosis and high fiber diet.  Resume plavix TODAY.  YOU HAD AN ENDOSCOPIC PROCEDURE TODAY AT [facility]:   Refer to the procedure report that was given to you for any specific questions about what was found during the examination.  If the procedure report does not answer your questions, please call your gastroenterologist to clarify.  If you requested that your care partner not be given the details of your procedure findings, then the procedure report has been included in a sealed envelope for you to review at your convenience later.  YOU SHOULD EXPECT: Some feelings of bloating in the abdomen. Passage of more gas than usual.  Walking can help get rid of the air that was put into your GI tract during the procedure and reduce the bloating. If you had a lower endoscopy (such as a colonoscopy or flexible sigmoidoscopy) you may notice spotting of blood in your stool or on the toilet paper. If you underwent a bowel prep for your procedure, you may not have a normal bowel movement for a few days.  Please Note:  You might notice some irritation and congestion in your nose or some drainage.  This is from the oxygen used during your procedure.  There is no need for concern and it should clear up in a day or so.  SYMPTOMS TO REPORT IMMEDIATELY:   Following lower endoscopy (colonoscopy or flexible sigmoidoscopy):  Excessive amounts of blood in the stool  Significant tenderness or worsening of abdominal pains  Swelling of the abdomen that is new, acute  Fever of 100F or higher  For urgent or emergent issues, a gastroenterologist can be reached at any hour by calling [phone].   DIET:  We do recommend a small meal at first, but then you may proceed to your regular diet.  Drink plenty of fluids but you should avoid alcoholic beverages for 24 hours.  ACTIVITY:  You should plan to take it easy for the rest of today and you should NOT DRIVE or  use heavy machinery until tomorrow (because of the sedation medicines used during the test).    FOLLOW UP: Our staff will call the number listed on your records 48-72 hours following your procedure to check on you and address any questions or concerns that you may have regarding the information given to you following your procedure. If we do not reach you, we will leave a message.  We will attempt to reach you two times.  During this call, we will ask if you have developed any symptoms of COVID 19. If you develop any symptoms (ie: fever, flu-like symptoms, shortness of breath, cough etc.) before then, please call [phone].  If you test positive for Covid 19 in the 2 weeks post procedure, please call and report this information to [place].    If any biopsies were taken you will be contacted by phone or by letter within the next 1-3 weeks.  Please call us at [phone] if you have not heard about the biopsies in 3 weeks.    SIGNATURES/CONFIDENTIALITY: You and/or your care partner have signed paperwork which will be entered into your electronic medical record.  These signatures attest to the fact that that the information above on your After Visit Summary has been reviewed and is understood.  Full responsibility of the confidentiality of this discharge information lies with you and/or your care-partner.

## 2019-05-08 ENCOUNTER — Other Ambulatory Visit: Payer: Self-pay

## 2019-05-08 ENCOUNTER — Telehealth: Payer: Self-pay

## 2019-05-08 ENCOUNTER — Ambulatory Visit (HOSPITAL_COMMUNITY)
Admission: RE | Admit: 2019-05-08 | Discharge: 2019-05-08 | Disposition: A | Discharge status: Home / Self Care | Payer: 59 | Source: Ambulatory Visit | Attending: Internal Medicine | Admitting: Internal Medicine

## 2019-05-08 ENCOUNTER — Telehealth: Payer: Self-pay | Admitting: Neurology

## 2019-05-08 DIAGNOSIS — G6181 Chronic inflammatory demyelinating polyneuritis: Secondary | ICD-10-CM | POA: Diagnosis present

## 2019-05-08 MED ORDER — SODIUM CHLORIDE 0.9 % IV SOLN
INTRAVENOUS | Status: DC | PRN
  Administered 2019-05-08: 250 mL via INTRAVENOUS

## 2019-05-08 MED ORDER — SODIUM CHLORIDE 0.9 % IV SOLN
1000.0000 mg | Freq: Once | INTRAVENOUS | Status: AC
  Administered 2019-05-08: 12:00:00 1000 mg via INTRAVENOUS
  Filled 2019-05-08: qty 8

## 2019-05-08 NOTE — Telephone Encounter (Signed)
Per Dr Tomi Likens.... If feeling well, he can drive home. He should take his medication and repeat blood pressure check at home. If still elevated, should contact his PCP.  Informed [place] at patient care center.

## 2019-05-08 NOTE — Telephone Encounter (Signed)
146/104 is the patients blood pressure, and he has not taken his medication today. They would like to know if they can go ahead with his infusion at this time? Patient is waiting at center. They also want parameters for future visits.

## 2019-05-08 NOTE — Telephone Encounter (Signed)
OK to give infusion as long as SBP < 160.

## 2019-05-08 NOTE — Telephone Encounter (Signed)
  Follow up Call-  Call back number 05/06/2019 06/05/2017  Post procedure Call Back phone  # [phone]  Permission to leave phone message Yes Yes  Some recent data might be hidden     Patient questions:  Do you have a fever, pain , or abdominal swelling? No. Pain Score  0 *  Have you tolerated food without any problems? Yes.    Have you been able to return to your normal activities? Yes.    Do you have any questions about your discharge instructions: Diet   No. Medications  No. Follow up visit  No.  Do you have questions or concerns about your Care? No.  Actions: * If pain score is 4 or above: No action needed, pain <4.  1. Have you developed a fever since your procedure? no  2.   Have you had an respiratory symptoms (SOB or cough) since your procedure? no  3.   Have you tested positive for COVID 19 since your procedure no  4.   Have you had any family members/close contacts diagnosed with the COVID 19 since your procedure?  no   If yes to any of these questions please route to Joylene John, RN and Alphonsa Gin, Therapist, sports.

## 2019-05-08 NOTE — Discharge Instructions (Signed)
Methylprednisolone Solution for Injection What is this medicine? METHYLPREDNISOLONE (meth ill pred NISS oh lone) is a corticosteroid. It is commonly used to treat inflammation of the skin, joints, lungs, and other organs. Common conditions treated include asthma, allergies, and arthritis. It is also used for other conditions, such as blood disorders and diseases of the adrenal glands. This medicine may be used for other purposes; ask your health care provider or pharmacist if you have questions. COMMON BRAND NAME(S): A-Methapred, Solu-Medrol What should I tell my health care provider before I take this medicine? They need to know if you have any of these conditions:  Cushing's syndrome  eye disease, vision problems  diabetes  glaucoma  heart disease  high blood pressure  infection (especially a virus infection such as chickenpox, cold sores, or herpes)  liver disease  mental illness  myasthenia gravis  osteoporosis  recently received or scheduled to receive a vaccine  seizures  stomach or intestine problems  thyroid disease  an unusual or allergic reaction to lactose, methylprednisolone, other medicines, foods, dyes, or preservatives  pregnant or trying to get pregnant  breast-feeding How should I use this medicine? This medicine is for injection or infusion into a vein. It is also for injection into a muscle. It is given by a health care professional in a hospital or clinic setting. Talk to your pediatrician regarding the use of this medicine in children. While this drug may be prescribed for selected conditions, precautions do apply. Overdosage: If you think you have taken too much of this medicine contact a poison control center or emergency room at once. NOTE: This medicine is only for you. Do not share this medicine with others. What if I miss a dose? This does not apply. What may interact with this medicine? Do not take this medicine with any of the following  medications:  alefacept  echinacea  iopamidol  live virus vaccines  metyrapone  mifepristone This medicine may also interact with the following medications:  amphotericin B  aspirin and aspirin-like medicines  certain antibiotics like erythromycin, clarithromycin, troleandomycin  certain medicines for diabetes  certain medicines for fungal infection like ketoconazole  certain medicines for seizures like carbamazepine, phenobarbital, phenytoin  certain medicines that treat or prevent blood clots like warfarin  cyclosporine  digoxin  diuretics  male hormones, like estrogens and birth control pills  isoniazid  NSAIDS, medicines for pain and inflammation, like ibuprofen or naproxen  other medicines for myasthenia gravis  rifampin  vaccines This list may not describe all possible interactions. Give your health care provider a list of all the medicines, herbs, non-prescription drugs, or dietary supplements you use. Also tell them if you smoke, drink alcohol, or use illegal drugs. Some items may interact with your medicine. What should I watch for while using this medicine? Tell your doctor or healthcare professional if your symptoms do not start to get better or if they get worse. Do not stop taking except on your doctor's advice. You may develop a severe reaction. Your doctor will tell you how much medicine to take. Your condition will be monitored carefully while you are receiving this medicine. This medicine may increase your risk of getting an infection. Tell your doctor or health care professional if you are around anyone with measles or chickenpox, or if you develop sores or blisters that do not heal properly. This medicine may increase blood sugar. Ask your healthcare provider if changes in diet or medicines are needed if you have diabetes. Tell  your doctor or health care professional right away if you have any change in your eyesight. Using this medicine for a  long time may increase your risk of low bone mass. Talk to your doctor about bone health. What side effects may I notice from receiving this medicine? Side effects that you should report to your doctor or health care professional as soon as possible:  allergic reactions like skin rash, itching or hives, swelling of the face, lips, or tongue  bloody or tarry stools  hallucination, loss of contact with reality  muscle cramps  muscle pain  palpitations  signs and symptoms of high blood sugar such as being more thirsty or hungry or having to urinate more than normal. You may also feel very tired or have blurry vision.  signs and symptoms of infection like fever or chills; cough; sore throat; pain or trouble passing urine  trouble passing urine Side effects that usually do not require medical attention (report to your doctor or health care professional if they continue or are bothersome):  changes in emotions or mood  constipation  diarrhea  excessive hair growth on the face or body  headache  nausea, vomiting  pain, redness, or irritation at site where injected  trouble sleeping  weight gain This list may not describe all possible side effects. Call your doctor for medical advice about side effects. You may report side effects to FDA at 1-800-FDA-1088. Where should I keep my medicine? This drug is given in a hospital or clinic and will not be stored at home. NOTE: This sheet is a summary. It may not cover all possible information. If you have questions about this medicine, talk to your doctor, pharmacist, or health care provider.  2020 Elsevier/Gold Standard (2018-01-10 09:12:19)

## 2019-05-08 NOTE — Telephone Encounter (Signed)
Caryl Pina took the message for [place] who was unable to come to the phone at that time

## 2019-05-08 NOTE — Telephone Encounter (Signed)
Spoke with [place].  Pt is s/p Solumedrol infusion. His BP is 180/120. Pt states that he feels fine. He did not take his BP medication today. Pt denies light headedness, dizziness, HA, change in vision. He does not have a driver with him.  Note sent to Dr. Tomi Likens who is on call since Posey Pronto is out of the office this afternoon.  Nurse Kelton Pillar can be reached back at [phone].

## 2019-05-08 NOTE — Progress Notes (Signed)
Patient received IV Solu-medrol . Pre-infusion BP was 146/104. RN called Narda Amber MD's office. RN spoke with Loma Sousa. Per MD, since the systolic BP is not more than 160, RN should infuse. Post infusion, BP was 173/120. BP rechecked manually, it was 180/120. MD's office contacted. Spoke with Caryl Pina. Per MD, since patient "feels well", patient can drive home, take his BP medications, recheck his BP. If it's still high, patient should follow up with the PCP.Tolerated well, discharge instructions given, verbalized understanding. Patient alert, oriented and ambulatory at the time of discharge.

## 2019-05-08 NOTE — Telephone Encounter (Signed)
Caller is needing to speak with nurse or Dr. Posey Pronto in regards to this patient getting an infusion. Thanks!

## 2019-05-12 ENCOUNTER — Other Ambulatory Visit: Payer: Self-pay

## 2019-05-12 ENCOUNTER — Encounter: Payer: Self-pay | Admitting: Neurology

## 2019-05-12 ENCOUNTER — Telehealth (HOSPITAL_COMMUNITY): Payer: Self-pay | Admitting: General Practice

## 2019-05-12 ENCOUNTER — Telehealth: Payer: Self-pay

## 2019-05-12 ENCOUNTER — Ambulatory Visit: Payer: 59 | Admitting: Neurology

## 2019-05-12 VITALS — BP 166/109 | HR 135 | Ht 70.5 in | Wt 225.0 lb

## 2019-05-12 DIAGNOSIS — G6181 Chronic inflammatory demyelinating polyneuritis: Secondary | ICD-10-CM

## 2019-05-12 DIAGNOSIS — M79604 Pain in right leg: Secondary | ICD-10-CM

## 2019-05-12 DIAGNOSIS — M79605 Pain in left leg: Secondary | ICD-10-CM | POA: Diagnosis not present

## 2019-05-12 DIAGNOSIS — I10 Essential (primary) hypertension: Secondary | ICD-10-CM | POA: Diagnosis not present

## 2019-05-12 NOTE — Telephone Encounter (Signed)
Christy from [facility] called to confirm if the sign and held order for Solu-medrol came through. Will send it as soon as possible.

## 2019-05-12 NOTE — Telephone Encounter (Signed)
Patient Care at [phone] is to call office back to confirm orders of solumedrol 1000mg  q21 for 4 months.

## 2019-05-12 NOTE — Patient Instructions (Addendum)
Return to clinic in 3 months

## 2019-05-12 NOTE — Progress Notes (Signed)
Follow-up Visit   Date: 05/12/19   [name] MRN: [id number] DOB: Oct 06, 1960   Interim History: [name] is a 59 y.o. right-handed male with asthma, TIA, thrombocytopenia, CKD, hypertension, hyperlipidemia, hypothyroidism, and CHF returning to the clinic for follow-up of neuropathy.  The patient was accompanied to the clinic by self.  History of present illness: He was hospitalized August 10 - December 21, 2018 for acute gallstone pancreatitis s/p cholecystectomy.  During the hospitalization, he developed numbness of the soles of the feet and lower legs. He had similar symptoms in the hands.  He says that the skin on the hands and feet peeled and since then, the sensation of his hands has returned, but he continues to have increased sensitivity and numbness over the soles of the feet.  He has some imbalance.  No falls or weakness.  He does not have any low back pain or radicular pain. He was on Ancef preoperatively, otherwise no antibiotics.   UPDATE 05/13/2019:  He is here for follow-up visit.  Since his last visit, he underwent NCS/EMG and CSF testing to evaluate symptoms of neuropathy.  EDX confirmed the presence of subacute sensory polyradiculoneuropathy and CSF shows mild elevation in protein, otherwise studies were all normal. Given that acute onset of symptoms, he was treated with solumedrol and has completed induction dose and one maintenance infusion.  He reports that the numbness in the feet has mildly improved.  Unfortunately, he suffered a fall in December and developed new achy and throbbing leg pain, involving the lower legs.  He does not report progression of numbness, which remains restricted to the feet, especially soles of the feet. Today, he is mostly bothered by severe achy and throbbing pain in the legs, which is worse at night time.  No burning or tingling is associated with his lower leg pain.   Medications:  Current Outpatient Medications on File Prior to  Visit  Medication Sig Dispense Refill  . aspirin EC 81 MG tablet Take 81 mg by mouth daily.    . calcium carbonate (TUMS EX) 750 MG chewable tablet Chew 2 tablets by mouth as needed for heartburn.    . clopidogrel (PLAVIX) 75 MG tablet Take 75 mg by mouth daily.    . DULoxetine (CYMBALTA) 30 MG capsule Take 1 capsule (30 mg total) by mouth daily. 30 capsule 3  . Evolocumab (REPATHA SURECLICK) [id number] MG/ML SOAJ Inject 140 mg into the skin every 14 (fourteen) days. 2 pen 11  . ezetimibe (ZETIA) 10 MG tablet Take 1 tablet (10 mg total) by mouth daily. 90 tablet 3  . fluticasone (FLONASE) 50 MCG/ACT nasal spray Place 2 sprays into both nostrils daily as needed for allergies or rhinitis.    . folic acid (FOLVITE) 1 MG tablet Take 1 tablet (1 mg total) by mouth daily. 30 tablet 11  . ketoconazole (NIZORAL) 2 % cream Apply 1 application topically 2 (two) times daily.    . lansoprazole (PREVACID) 30 MG capsule TAKE ONE CAPSULE BY MOUTH TWICE A DAY BEFORE MEALS 180 capsule 0  . levocetirizine (XYZAL) 5 MG tablet Take 1 tablet (5 mg total) by mouth every evening. 30 tablet 2  . metoprolol succinate (TOPROL-XL) 25 MG 24 hr tablet Take 1 tablet (25 mg total) by mouth daily. 90 tablet 3  . nitroGLYCERIN (NITROSTAT) 0.4 MG SL tablet Place 1 tablet (0.4 mg total) under the tongue every 5 (five) minutes as needed for chest pain. 25 tablet 5  . nortriptyline (PAMELOR)  25 MG capsule Take 1 capsule (25 mg total) by mouth at bedtime. 30 capsule 2  . Polyethyl Glycol-Propyl Glycol (SYSTANE OP) Place 1 drop into both eyes every 3 (three) hours as needed (dry eyes).     . potassium chloride (KLOR-CON) 10 MEQ tablet Take 2 tablets (20 mEq total) by mouth 2 (two) times daily. 120 tablet 5  . promethazine (PHENERGAN) 12.5 MG tablet TAKE ONE TABLET BY MOUTH EVERY 6 HOURS AS NEEDED FOR NAUSEA AND VOMITING 30 tablet 0  . rosuvastatin (CRESTOR) 20 MG tablet Take 1 tablet (20 mg total) by mouth daily. 30 tablet 11  .  triamcinolone cream (KENALOG) 0.1 % Apply 1 application topically 2 (two) times daily. Let sit for at least 20 minutes before washing hands    . amLODipine (NORVASC) 10 MG tablet Take 10 mg by mouth daily.    Marland Kitchen HYDROcodone-acetaminophen (NORCO/VICODIN) 5-325 MG tablet Take 2 tablets by mouth every 6 (six) hours as needed. (Patient not taking: Reported on 05/12/2019) 30 tablet 0  . isosorbide mononitrate (IMDUR) 30 MG 24 hr tablet Take 1 tablet (30 mg total) by mouth daily. (Patient not taking: Reported on 05/12/2019) 90 tablet 2  . losartan (COZAAR) 100 MG tablet Take 100 mg by mouth daily.    . pregabalin (LYRICA) 100 MG capsule TAKE ONE CAPSULE BY MOUTH TWICE A DAY (Patient not taking: Reported on 05/12/2019) 60 capsule 0   No current facility-administered medications on file prior to visit.    Allergies:  Allergies  Allergen Reactions  . Morphine And Related Nausea And Vomiting  . Tylenol [Acetaminophen] Nausea And Vomiting  . Tramadol Palpitations    Vital Signs:  BP (!) 166/109   Pulse (!) 135   Ht 5' 10.5" (1.791 m)   Wt 225 lb (102.1 kg)   SpO2 96%   BMI 31.83 kg/m    Neurological Exam: MENTAL STATUS including orientation to time, place, person, recent and remote memory, attention span and concentration, language, and fund of knowledge is normal.  Speech is not dysarthric.  CRANIAL NERVES: Face is symmetric.   MOTOR:  Motor strength is 5/5 in all extremities, including distally in the feet and toes.  No atrophy, fasciculations or abnormal movements.  No pronator drift.  Tone is normal.    MSRs:  Reflexes are 2+/4 throughout, trace at the ankles bilaterally (improved).  SENSORY:  Reduced vibration at the great toe bilaterally, reduced temperature and pin prick over the dorsum of the feet.  Sensation is intact above the ankles and in the hands.   COORDINATION/GAIT:  Normal finger-to- nose-finger. Gait appears stable, stable and unassisted.  He tends to keep toes extended when  walking  Data: NCS/EMG of the left arm and leg 01/28/2019: The electrophysiologic findings are consistent with a subacute sensory, predominantly axonal, polyneuropathy affecting the left upper and lower extremities.  The presence of sural sparing warrants further evaluation for GBS-like subtypes.  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  He was started on a trial of solumedrol for neuropathy and he has received his first maintenance dose. It is too early to determine whether if he has any benefit as it can take several months to appreciate.  I will keep him on Solumedrol 1g every 21 days for another 3-4 cycles.  If there is improvement in numbness, he will have ongoing infusions.  If no improvement, ongoing benefit is low so it will be discontinued.   Regarding his bilateral lower leg pain, the nature of his pain (achy, throbbing) is not consistent with neuropathy and unrelated to what I am treating him for. 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  I discussed at length the importance of trying to tease apart neurological symptoms from other conditions as steroids are only being used to manage his numbness.  He will discuss pain management for his leg pain with his PCP.    His blood pressure and heart rate are elevated today, he is asymptomatic.  He tells me that he did not take his morning medications and usually takes it around 9am.  I urged him to take his medications when he returns home.   Return to clinic in 3 months.   Thank you for allowing me to participate in patient's care.  If I can answer any additional questions, I would be pleased  to do so.    Sincerely,    Yonna Alwin K. Posey Pronto, DO

## 2019-05-12 NOTE — Telephone Encounter (Signed)
Called and spoke w/pt about needing card and the pt stated that they will bring it tomorrow when they go to complete labs.

## 2019-05-13 ENCOUNTER — Telehealth: Payer: Self-pay | Admitting: Neurology

## 2019-05-13 ENCOUNTER — Other Ambulatory Visit: Payer: Self-pay

## 2019-05-13 ENCOUNTER — Other Ambulatory Visit: Payer: Self-pay | Admitting: Family Medicine

## 2019-05-13 ENCOUNTER — Other Ambulatory Visit: Payer: 59

## 2019-05-13 DIAGNOSIS — I2510 Atherosclerotic heart disease of native coronary artery without angina pectoris: Secondary | ICD-10-CM

## 2019-05-13 DIAGNOSIS — G6181 Chronic inflammatory demyelinating polyneuritis: Secondary | ICD-10-CM

## 2019-05-13 MED ORDER — PREGABALIN 100 MG PO CAPS: 100.0000 mg | ORAL_CAPSULE | Freq: Two times a day (BID) | ORAL | 3 refills | Status: AC

## 2019-05-13 MED ORDER — PREGABALIN 100 MG PO CAPS: 100.0000 mg | ORAL_CAPSULE | Freq: Two times a day (BID) | ORAL | 0 refills | Status: DC

## 2019-05-13 NOTE — Telephone Encounter (Signed)
Please assist with this, I discussed this with you earlier and per messages at the hospital they do not have the infusion orders

## 2019-05-13 NOTE — Telephone Encounter (Signed)
Please advise on below  

## 2019-05-13 NOTE — Telephone Encounter (Signed)
[facility] called needing Paper work. They are needing a new script faxed over to make appt. [phone]. Thank you

## 2019-05-13 NOTE — Telephone Encounter (Signed)
Patient called again to check on the status of the order for the infusion. He said his hands have started hurting and pealing again. Patient is ready to get this scheduled.

## 2019-05-13 NOTE — Telephone Encounter (Signed)
Patient called and left a message stating that [facility] does not have the orders for his infusion.

## 2019-05-14 ENCOUNTER — Telehealth (HOSPITAL_COMMUNITY): Payer: Self-pay | Admitting: General Practice

## 2019-05-14 ENCOUNTER — Telehealth: Payer: Self-pay | Admitting: Neurology

## 2019-05-14 LAB — HEPATIC FUNCTION PANEL
ALT: 121 IU/L — ABNORMAL HIGH (ref 0–44)
AST: 236 IU/L — ABNORMAL HIGH (ref 0–40)
Albumin: 4.6 g/dL (ref 3.8–4.9)
Alkaline Phosphatase: 87 IU/L (ref 39–117)
Bilirubin Total: 2.8 mg/dL — ABNORMAL HIGH (ref 0.0–1.2)
Bilirubin, Direct: 1.5 mg/dL — ABNORMAL HIGH (ref 0.00–0.40)
Total Protein: 7.1 g/dL (ref 6.0–8.5)

## 2019-05-14 LAB — LIPID PANEL
Chol/HDL Ratio: 2.1 ratio (ref 0.0–5.0)
Cholesterol, Total: 377 mg/dL — ABNORMAL HIGH (ref 100–199)
HDL: 177 mg/dL (ref >39)
LDL Chol Calc (NIH): 179 mg/dL — ABNORMAL HIGH (ref 0–99)
Triglycerides: 135 mg/dL (ref 0–149)
VLDL Cholesterol Cal: 21 mg/dL (ref 5–40)

## 2019-05-14 NOTE — Telephone Encounter (Signed)
Spoke with Kelton Pillar and he stated that the way it was done is still incorrect. He stated to hand write the order and then  fax [phone]

## 2019-05-14 NOTE — Telephone Encounter (Signed)
Called twice and was place on hold for over 5 minutes each time.

## 2019-05-14 NOTE — Telephone Encounter (Signed)
Written order pending signature before faxing

## 2019-05-14 NOTE — Telephone Encounter (Signed)
Please fax order for Solumedrol 1000mg  every 21 days for 3 cycles.  Thanks.

## 2019-05-14 NOTE — Telephone Encounter (Signed)
Patient called office today at 12:50pm and spoke to the after hours nurse. He was very upset that no one has reached out to him regarding his infusions. He stated that he should have these 5 days a week and no one has contacted him about it. In reviewing his plan of care with Dr. Posey Pronto patient received his 1st infusion in Dec. 2020 which was for 5 days. Then on 05/08/19 patient received his 2nd infusion (1 day) his next infusion is scheduled for February. Per plan of care the first infusion is for 5 days and then every 21 days after that. He has been receiving his infusion on a timely interval per plan of care. I called patient back today (1/20) and left patient a voicemail to follow-up with him.

## 2019-05-14 NOTE — Telephone Encounter (Signed)
Courtney of Narda Amber MD's office at [facility] called to clarify Solu-medrol order for the patient. Will be sending it by Fax.

## 2019-05-14 NOTE — Addendum Note (Signed)
Addended by: Drucilla Schmidt on: 05/14/2019 02:19 PM   Modules accepted: Orders

## 2019-05-14 NOTE — Telephone Encounter (Signed)
Please call patient care/short stay and let them know the orders are in epic so they can schedule patient. Thanks.

## 2019-05-14 NOTE — Telephone Encounter (Signed)
Patient care center is aware and are going to call and make appointment for patient.

## 2019-05-14 NOTE — Telephone Encounter (Signed)
[name] is needing to speak with you about orders. Thanks

## 2019-05-15 ENCOUNTER — Other Ambulatory Visit: Payer: Self-pay | Admitting: Pharmacist

## 2019-05-15 ENCOUNTER — Telehealth: Payer: Self-pay

## 2019-05-15 MED ORDER — REPATHA SURECLICK 140 MG/ML ~~LOC~~ SOAJ: 140.0000 mg | SUBCUTANEOUS | 11 refills | Status: AC

## 2019-05-15 NOTE — Telephone Encounter (Signed)
Also called to make sure that the pt is taking the drug

## 2019-05-15 NOTE — Telephone Encounter (Signed)
lmomed the pt to bring new insurance

## 2019-05-16 ENCOUNTER — Telehealth: Payer: Self-pay | Admitting: Neurology

## 2019-05-16 ENCOUNTER — Ambulatory Visit (HOSPITAL_COMMUNITY): Payer: 59

## 2019-05-16 NOTE — Telephone Encounter (Signed)
[name] called on behalf of the patient regarding him being in terrible pain on a scale 1-10 she said its more than a 10. She would like to know if something can be done for him? Please Call. Thank you

## 2019-05-16 NOTE — Telephone Encounter (Signed)
Left message for wife to return call.  Gwinda Passe is the aunt who is on the [facility] however the pt did not sign the form.

## 2019-05-16 NOTE — Telephone Encounter (Signed)
Pt has been scheduled at [facility] 05/29/19 for Solumedrol.

## 2019-05-16 NOTE — Telephone Encounter (Signed)
Patient returning call about his insurance information.

## 2019-05-16 NOTE — Telephone Encounter (Signed)
Spoke with patient. He c/o bottom of feet being numb as well as his toes. Pt can trpi and fall at times. He c/o lying in bed all day and can barely walk.  Pt wants to know what to do? Why can he not have infusions everyday?  Per Dr. Posey Pronto,  Pt may increase Cymbalta to 60mg  qd for possible nerve pain. Refer to patient to pain management.  Pt was informed that he did have a started dose of Solumedrol infusion daily but after that the drug is only given every 3 weeks. Explained to pt the risks of steroids ( BP problems, diabetic problems, etc) He understood. Pt is aware of his appt at [facility] hospital on  For his next infusion.  He was concerned about going to pain management because he does not want to be put on medication. Explained that they will have other therapies to discuss other than medication use. He agreed to the referral.

## 2019-05-17 ENCOUNTER — Other Ambulatory Visit: Payer: Self-pay | Admitting: Family Medicine

## 2019-05-17 DIAGNOSIS — G629 Polyneuropathy, unspecified: Secondary | ICD-10-CM

## 2019-05-19 NOTE — Telephone Encounter (Signed)
Called and lmomed the pt stated that as long as his insurance hasn't changed he is still approved and doesn't need to respond but that if it has he needs to call me back

## 2019-05-20 ENCOUNTER — Encounter: Payer: Self-pay | Admitting: *Deleted

## 2019-05-22 ENCOUNTER — Other Ambulatory Visit: Payer: Self-pay | Admitting: Family Medicine

## 2019-05-22 DIAGNOSIS — M792 Neuralgia and neuritis, unspecified: Secondary | ICD-10-CM

## 2019-05-23 ENCOUNTER — Encounter: Payer: 59 | Admitting: Family Medicine

## 2019-05-27 NOTE — Telephone Encounter (Signed)
Received prior auth form for Repatha from [place] - attempted to submit, pt's new insurance prefers Praluent. Unable to submit new PA since we do not have patient's new insurance.  Tried to call pt again, no answer and voicemail is full so unable to leave a message. Will need new insurance info to submit prior auth for Praluent 150mg  Q2W and to see if pt can afford $25 copay for Praluent since their copay card has changed for this year.

## 2019-05-29 ENCOUNTER — Ambulatory Visit (HOSPITAL_COMMUNITY): Payer: 59

## 2019-05-29 ENCOUNTER — Telehealth: Payer: Self-pay | Admitting: Family Medicine

## 2019-05-29 NOTE — Telephone Encounter (Signed)
[employer] dropped off document to be filled out by provider (1 page death certificate - from [place]) Please call at at [phone] when document ready. Document put at front office tray under provider name.

## 2019-06-02 NOTE — Telephone Encounter (Signed)
Death certificate was picked up today in our office/// was just informed by front staff.

## 2019-06-02 NOTE — Telephone Encounter (Signed)
I am just seeing this and not sure you have seen document or not? I did not see in folder this morning.

## 2019-06-02 NOTE — Telephone Encounter (Signed)
It was filled out with the presumption that he passed from cardiac arrest given his heart history. We were supposed to find out if there was another cause, but I had not heard anything. Ty.

## 2019-06-04 ENCOUNTER — Other Ambulatory Visit: Payer: Self-pay | Admitting: Family Medicine

## 2019-06-13 ENCOUNTER — Encounter: Payer: 59 | Admitting: Family Medicine

## 2019-06-23 DEATH — deceased

## 2019-07-12 ENCOUNTER — Other Ambulatory Visit: Payer: Self-pay | Admitting: Physician Assistant

## 2019-07-16 ENCOUNTER — Other Ambulatory Visit: Payer: Self-pay | Admitting: Neurology

## 2019-08-11 ENCOUNTER — Ambulatory Visit: Payer: 59 | Admitting: Neurology

## 2020-04-01 IMAGING — MR MR ABDOMEN WO/W CM MRCP
19 of 23 series · 43 of 48 positions shown · IV contrast (Gadavist)
Comparison: CT abdomen/pelvis dated 12/02/2018. MRI abdomen dated
10/30/2018.

CLINICAL DATA: Abnormal LFTs

EXAM:
MRI ABDOMEN WITHOUT AND WITH CONTRAST (INCLUDING MRCP)
TECHNIQUE: Multiplanar multisequence MR imaging of the abdomen was performed
both before and after the administration of intravenous contrast.
Heavily T2-weighted images of the biliary and pancreatic ducts were
obtained, and three-dimensional MRCP images were rendered by post
processing.
CONTRAST:  10 mL Gadovist IV

[Series 4: ax haste · axial · 7.0mm · 1.25mm/px · 1 of 36 slices shown]
[im 1/36]
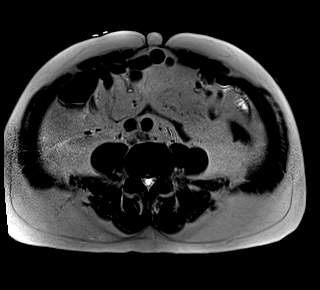

[Series 5: bSSFP · coronal · 6.0mm · 0.82mm/px · 1 of 30 slices shown]
[im 1/30]
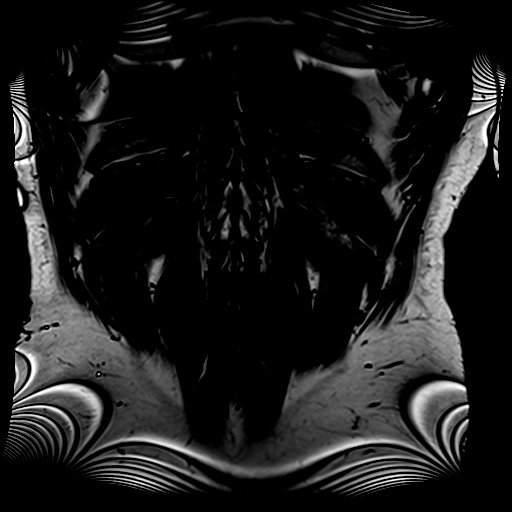

[Series 6: T2 fat-sat · axial · 7.0mm · 1.25mm/px · 1 of 36 slices shown]
[im 1/36]
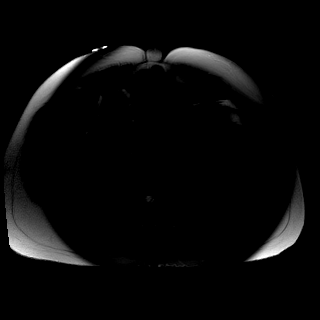

[Series 7: DWI · axial · 7.0mm · 1.49mm/px · z∈[-214,+80]mm · 3 of 108 slices shown (1 of 2)]
[im 1/108]
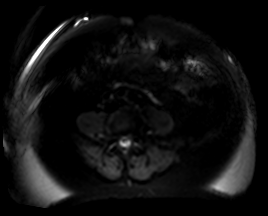
[im 54/108]
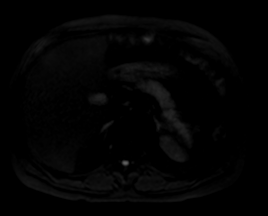
[im 108/108]
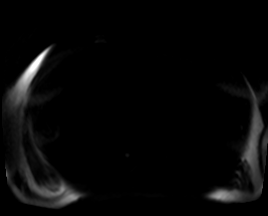

[Series 8: DWI · axial · 7.0mm · 1.49mm/px · 1 of 36 slices shown (2 of 2)]
[im 1/36]
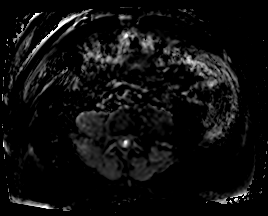

[Series 11: MRCP · coronal · 4.0mm · 1.19mm/px · 1 of 15 slices shown]
[im 1/15]
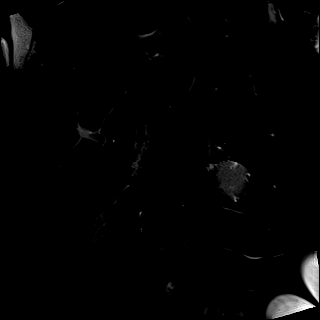

[Series 13: radials · coronal · 50.0mm · 0.83mm/px · 1 of 5 slices shown]
[im 1/5]
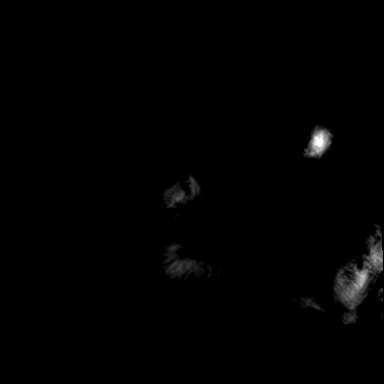

[Series 14: ax in and · axial · 4.0mm · 1.25mm/px · z∈[-209,+75]mm · 4 of 144 slices shown]
[im 1/144]
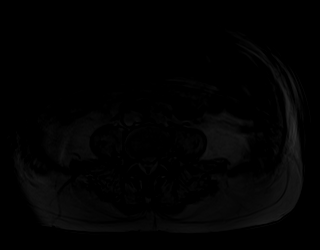
[im 48/144]
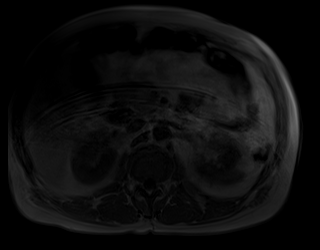
[im 96/144]
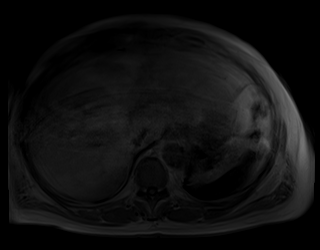
[im 144/144]
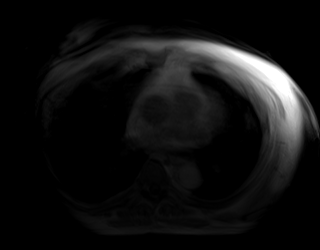

[Series 15: T1 dynamic · axial · non-contrast · 4.0mm · 1.25mm/px · z∈[-209,+75]mm · 2 of 72 slices shown (1 of 5)]
[im 1/72]
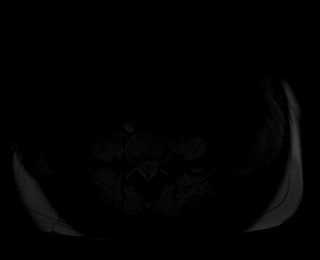
[im 72/72]
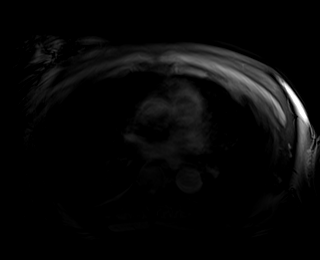

[Series 17: T1 dynamic post-contrast · axial · 4.0mm · 1.25mm/px · z∈[-209,+75]mm · 3 of 72 slices shown (1 of 5)]
[im 1/72]
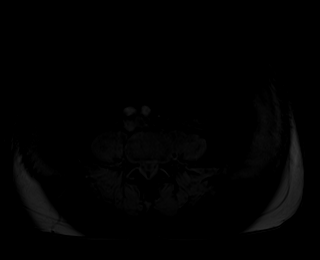
[im 36/72]
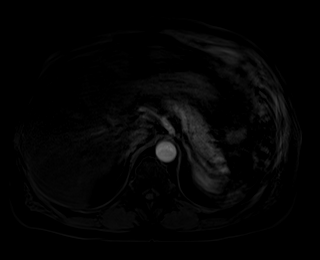
[im 72/72]
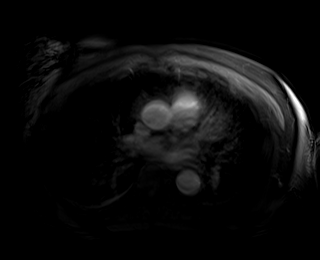

[Series 18: T1 dynamic · axial · 4.0mm · 1.25mm/px · z∈[-209,+75]mm · 3 of 72 slices shown (2 of 5)]
[im 1/72]
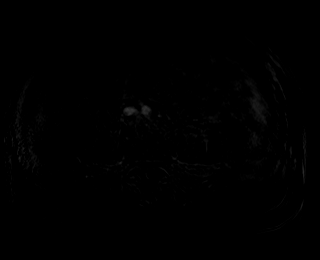
[im 36/72]
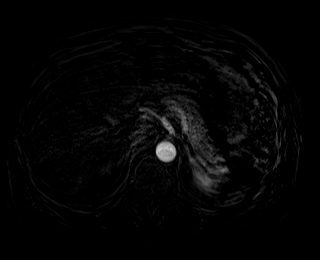
[im 72/72]
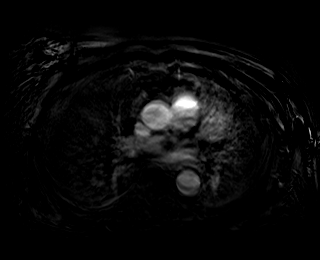

[Series 19: T1 dynamic post-contrast · axial · 4.0mm · 1.25mm/px · z∈[-209,+75]mm · 3 of 72 slices shown (2 of 5)]
[im 1/72]
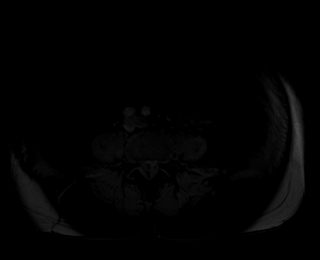
[im 36/72]
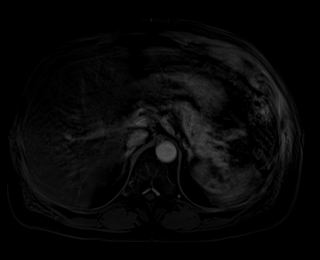
[im 72/72]
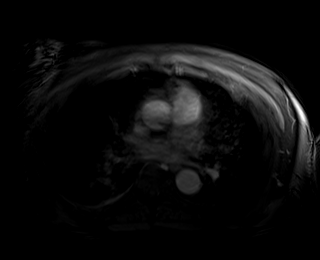

[Series 20: T1 dynamic · axial · 4.0mm · 1.25mm/px · z∈[-209,+75]mm · 3 of 72 slices shown (3 of 5)]
[im 1/72]
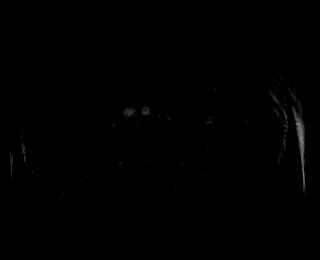
[im 36/72]
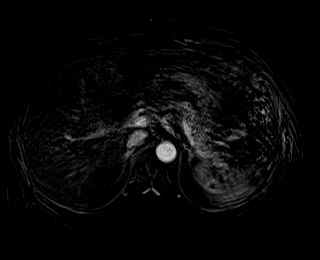
[im 72/72]
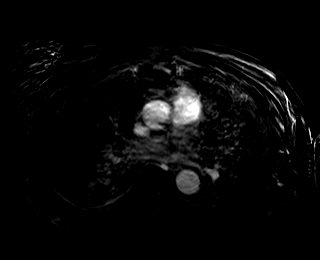

[Series 21: T1 dynamic post-contrast · axial · 4.0mm · 1.25mm/px · z∈[-209,+75]mm · 3 of 72 slices shown (3 of 5)]
[im 1/72]
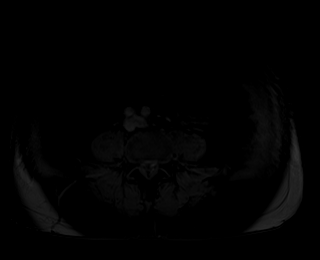
[im 36/72]
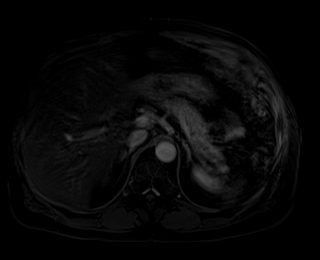
[im 72/72]
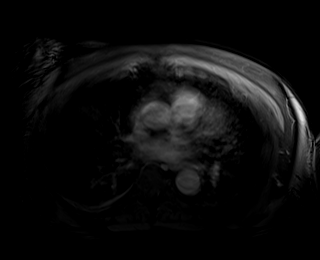

[Series 22: T1 dynamic · axial · 4.0mm · 1.25mm/px · z∈[-209,+75]mm · 3 of 72 slices shown (4 of 5)]
[im 1/72]
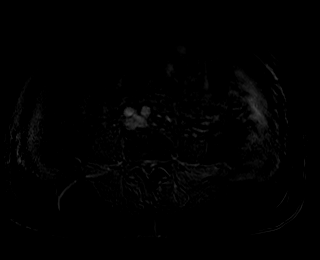
[im 36/72]
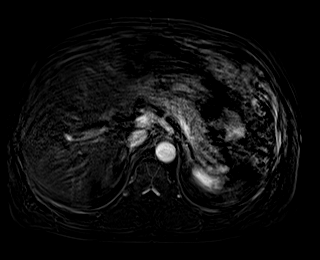
[im 72/72]
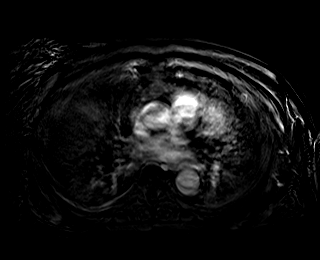

[Series 23: T1 dynamic post-contrast · axial · 4.0mm · 1.25mm/px · z∈[-209,+75]mm · 3 of 72 slices shown (4 of 5)]
[im 1/72]
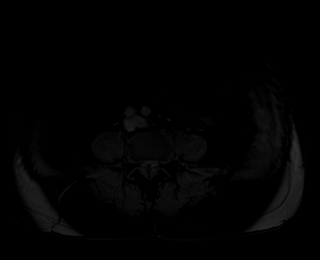
[im 36/72]
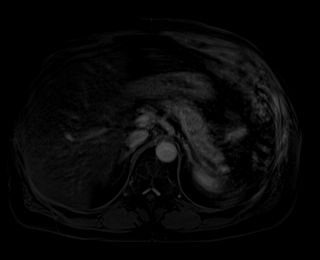
[im 72/72]
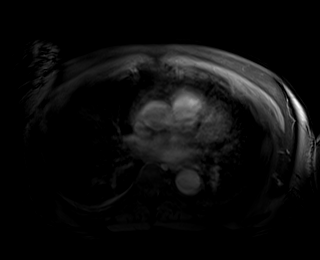

[Series 24: T1 dynamic · axial · 4.0mm · 1.25mm/px · z∈[-209,+75]mm · 3 of 72 slices shown (5 of 5)]
[im 1/72]
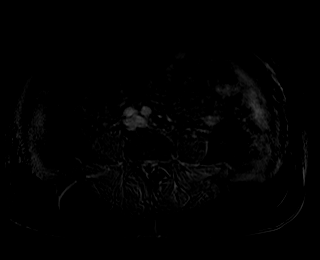
[im 36/72]
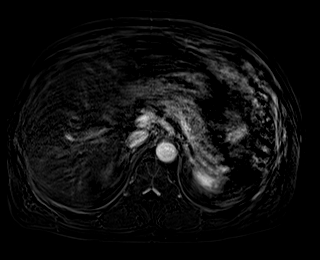
[im 72/72]
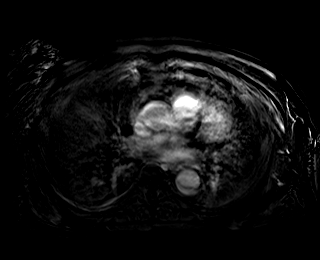

[Series 25: T1 dynamic post-contrast · coronal · 3.0mm · 1.31mm/px · 3 of 72 slices shown (5 of 5)]
[im 1/72]
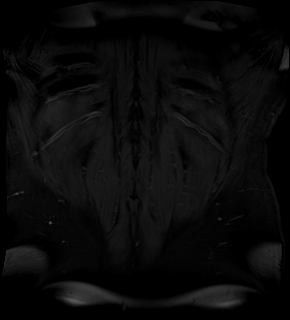
[im 36/72]
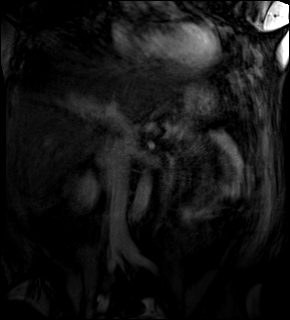
[im 72/72]
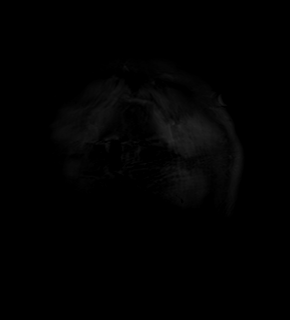

[Series 26: t1_tfl_in-phase_tra_trigresp · axial · 7.0mm · 0.78mm/px · 1 of 36 slices shown]
[im 1/36]
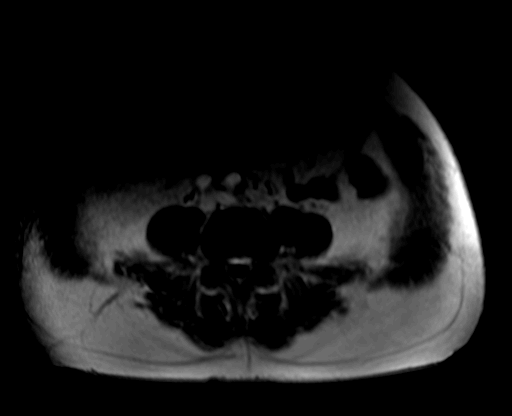

[43 of 48 positions shown; findings below may reference images not displayed]

FINDINGS: Motion degraded images.

Lower chest: Lung bases are clear.

Hepatobiliary: Severe hepatic steatosis. No focal hepatic lesions,
noting that the dynamic postcontrast imaging is severely motion
degraded.

Gallbladder is unremarkable. No intrahepatic or extrahepatic ductal
dilatation.

Pancreas:  Within normal limits.

Spleen:  Within normal limits.

Adrenals/Urinary Tract:  Adrenal glands are within normal limits.

Two left renal cysts measuring up to 2.8 cm, simple. Right kidney is
within normal limits. No hydronephrosis.

Stomach/Bowel: Stomach is within normal limits.

Visualized bowel is unremarkable.

Vascular/Lymphatic:  No evidence of abdominal aortic aneurysm.

No suspicious abdominal lymphadenopathy.

Other:  No abdominal ascites.

Musculoskeletal: No focal osseous lesions.
IMPRESSION: Motion degraded images.

Severe hepatic steatosis.

Otherwise unremarkable MRI abdomen.

## 2020-04-03 IMAGING — NM NUCLEAR MEDICINE HEPATOBILIARY INCLUDE GB
1 series · 6 of 6 positions shown · non-contrast
Comparison: None.

CLINICAL DATA: Abdominal pain

EXAM:
NUCLEAR MEDICINE HEPATOBILIARY IMAGING
TECHNIQUE: Sequential images of the abdomen were obtained [DATE] minutes
following intravenous administration of radiopharmaceutical.
RADIOPHARMACEUTICALS:  5.2 mCi Wc-55m  Choletec IV

[he hepatobiliary · 4.52mm/px · 6 of 45 frames shown]
[frame 4/45]
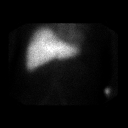
[frame 12/45]
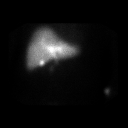
[frame 19/45]
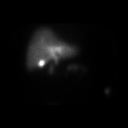
[frame 27/45]
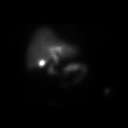
[frame 34/45]
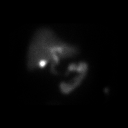
[frame 42/45]
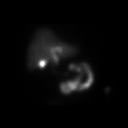

[6 of 6 positions shown; findings below may reference images not displayed]

FINDINGS: Prompt uptake and biliary excretion of activity by the liver is
seen. Gallbladder activity is visualized, consistent with patency of
cystic duct. Biliary activity passes into small bowel, consistent
with patent common bile duct.
IMPRESSION: Normal exam.
